# Patient Record
Sex: Male | Born: 1945
Health system: Southern US, Community
[De-identification: ages and names within clinical notes are randomized; demographics above are authoritative.]

## PROBLEM LIST (undated history)

## (undated) ENCOUNTER — Ambulatory Visit (HOSPITAL_COMMUNITY): Admission: EM | Payer: Medicare Other | Source: Home / Self Care

## (undated) DIAGNOSIS — F410 Panic disorder [episodic paroxysmal anxiety] without agoraphobia: Secondary | ICD-10-CM

## (undated) DIAGNOSIS — I10 Essential (primary) hypertension: Secondary | ICD-10-CM

## (undated) DIAGNOSIS — I480 Paroxysmal atrial fibrillation: Secondary | ICD-10-CM

## (undated) DIAGNOSIS — M47812 Spondylosis without myelopathy or radiculopathy, cervical region: Secondary | ICD-10-CM

## (undated) DIAGNOSIS — C9110 Chronic lymphocytic leukemia of B-cell type not having achieved remission: Secondary | ICD-10-CM

## (undated) DIAGNOSIS — E785 Hyperlipidemia, unspecified: Secondary | ICD-10-CM

## (undated) DIAGNOSIS — I340 Nonrheumatic mitral (valve) insufficiency: Secondary | ICD-10-CM

## (undated) DIAGNOSIS — R51 Headache: Secondary | ICD-10-CM

## (undated) DIAGNOSIS — T45515A Adverse effect of anticoagulants, initial encounter: Secondary | ICD-10-CM

## (undated) DIAGNOSIS — I351 Nonrheumatic aortic (valve) insufficiency: Secondary | ICD-10-CM

## (undated) DIAGNOSIS — R911 Solitary pulmonary nodule: Secondary | ICD-10-CM

## (undated) DIAGNOSIS — Z8546 Personal history of malignant neoplasm of prostate: Secondary | ICD-10-CM

## (undated) DIAGNOSIS — F419 Anxiety disorder, unspecified: Secondary | ICD-10-CM

## (undated) DIAGNOSIS — Z86711 Personal history of pulmonary embolism: Secondary | ICD-10-CM

## (undated) DIAGNOSIS — G43909 Migraine, unspecified, not intractable, without status migrainosus: Secondary | ICD-10-CM

## (undated) HISTORY — PX: OTHER SURGICAL HISTORY: SHX169

## (undated) HISTORY — DX: Paroxysmal atrial fibrillation: I48.0

## (undated) HISTORY — DX: Personal history of pulmonary embolism: Z86.711

## (undated) HISTORY — DX: Essential (primary) hypertension: I10

## (undated) HISTORY — DX: Nonrheumatic mitral (valve) insufficiency: I34.0

## (undated) HISTORY — PX: SHOULDER SURGERY: SHX246

## (undated) HISTORY — DX: Chronic lymphocytic leukemia of B-cell type not having achieved remission: C91.10

## (undated) HISTORY — DX: Nonrheumatic aortic (valve) insufficiency: I35.1

## (undated) HISTORY — DX: Spondylosis without myelopathy or radiculopathy, cervical region: M47.812

## (undated) HISTORY — PX: CATARACT EXTRACTION: SUR2

## (undated) HISTORY — DX: Adverse effect of anticoagulants, initial encounter: T45.515A

## (undated) HISTORY — DX: Solitary pulmonary nodule: R91.1

## (undated) HISTORY — DX: Hyperlipidemia, unspecified: E78.5

## (undated) HISTORY — DX: Headache: R51

## (undated) HISTORY — DX: Anxiety disorder, unspecified: F41.9

## (undated) HISTORY — DX: Migraine, unspecified, not intractable, without status migrainosus: G43.909

## (undated) HISTORY — DX: Personal history of malignant neoplasm of prostate: Z85.46

## (undated) HISTORY — PX: PENILE PROSTHESIS IMPLANT: SHX240

## (undated) HISTORY — DX: Panic disorder (episodic paroxysmal anxiety): F41.0

## (undated) HISTORY — PX: TRANSTHORACIC ECHOCARDIOGRAM: SHX275

---

## 1998-09-18 ENCOUNTER — Emergency Department (HOSPITAL_COMMUNITY): Admission: EM | Admit: 1998-09-18 | Discharge: 1998-09-18 | Payer: Self-pay | Admitting: Emergency Medicine

## 1999-11-01 ENCOUNTER — Emergency Department (HOSPITAL_COMMUNITY): Admission: EM | Admit: 1999-11-01 | Discharge: 1999-11-01 | Payer: Self-pay | Admitting: Emergency Medicine

## 1999-11-03 ENCOUNTER — Emergency Department (HOSPITAL_COMMUNITY): Admission: EM | Admit: 1999-11-03 | Discharge: 1999-11-03 | Payer: Self-pay | Admitting: Emergency Medicine

## 1999-11-05 ENCOUNTER — Emergency Department (HOSPITAL_COMMUNITY): Admission: EM | Admit: 1999-11-05 | Discharge: 1999-11-05 | Payer: Self-pay | Admitting: Emergency Medicine

## 1999-12-28 ENCOUNTER — Encounter: Payer: Self-pay | Admitting: Orthopedic Surgery

## 1999-12-28 ENCOUNTER — Encounter: Admission: RE | Admit: 1999-12-28 | Discharge: 1999-12-28 | Payer: Self-pay | Admitting: Orthopedic Surgery

## 2001-11-11 DIAGNOSIS — Z8546 Personal history of malignant neoplasm of prostate: Secondary | ICD-10-CM

## 2001-11-11 HISTORY — DX: Personal history of malignant neoplasm of prostate: Z85.46

## 2002-01-15 ENCOUNTER — Encounter (INDEPENDENT_AMBULATORY_CARE_PROVIDER_SITE_OTHER): Payer: Self-pay | Admitting: *Deleted

## 2002-01-15 ENCOUNTER — Ambulatory Visit (HOSPITAL_BASED_OUTPATIENT_CLINIC_OR_DEPARTMENT_OTHER): Admission: RE | Admit: 2002-01-15 | Discharge: 2002-01-16 | Payer: Self-pay | Admitting: Orthopedic Surgery

## 2002-02-19 ENCOUNTER — Ambulatory Visit: Admission: RE | Admit: 2002-02-19 | Discharge: 2002-05-20 | Payer: Self-pay | Admitting: Radiation Oncology

## 2002-04-11 HISTORY — PX: PROSTATECTOMY: SHX69

## 2002-04-23 ENCOUNTER — Encounter: Payer: Self-pay | Admitting: Urology

## 2002-04-30 ENCOUNTER — Encounter (INDEPENDENT_AMBULATORY_CARE_PROVIDER_SITE_OTHER): Payer: Self-pay | Admitting: *Deleted

## 2002-04-30 ENCOUNTER — Inpatient Hospital Stay (HOSPITAL_COMMUNITY): Admission: RE | Admit: 2002-04-30 | Discharge: 2002-05-04 | Payer: Self-pay | Admitting: Urology

## 2002-05-02 ENCOUNTER — Encounter: Payer: Self-pay | Admitting: Urology

## 2003-02-04 ENCOUNTER — Encounter: Payer: Self-pay | Admitting: Gastroenterology

## 2003-02-04 ENCOUNTER — Encounter: Payer: Self-pay | Admitting: Internal Medicine

## 2003-04-14 ENCOUNTER — Ambulatory Visit (HOSPITAL_COMMUNITY): Admission: RE | Admit: 2003-04-14 | Discharge: 2003-04-14 | Payer: Self-pay | Admitting: Cardiovascular Disease

## 2003-11-12 ENCOUNTER — Emergency Department (HOSPITAL_COMMUNITY): Admission: EM | Admit: 2003-11-12 | Discharge: 2003-11-13 | Payer: Self-pay | Admitting: Emergency Medicine

## 2003-11-13 ENCOUNTER — Emergency Department (HOSPITAL_COMMUNITY): Admission: EM | Admit: 2003-11-13 | Discharge: 2003-11-13 | Payer: Self-pay

## 2004-09-12 ENCOUNTER — Ambulatory Visit: Payer: Self-pay | Admitting: Psychology

## 2004-09-14 ENCOUNTER — Ambulatory Visit: Payer: Self-pay | Admitting: Cardiovascular Disease

## 2004-09-19 ENCOUNTER — Observation Stay (HOSPITAL_COMMUNITY): Admission: RE | Admit: 2004-09-19 | Discharge: 2004-09-20 | Payer: Self-pay | Admitting: Urology

## 2004-10-11 ENCOUNTER — Ambulatory Visit: Payer: Self-pay | Admitting: Psychology

## 2004-10-15 ENCOUNTER — Ambulatory Visit: Payer: Self-pay | Admitting: Psychology

## 2004-11-08 ENCOUNTER — Ambulatory Visit: Payer: Self-pay | Admitting: Psychology

## 2004-11-22 ENCOUNTER — Ambulatory Visit: Payer: Self-pay | Admitting: Psychology

## 2004-12-06 ENCOUNTER — Ambulatory Visit: Payer: Self-pay | Admitting: Psychology

## 2004-12-19 ENCOUNTER — Ambulatory Visit: Payer: Self-pay | Admitting: Psychology

## 2005-01-01 ENCOUNTER — Ambulatory Visit: Payer: Self-pay | Admitting: Psychology

## 2005-01-17 ENCOUNTER — Ambulatory Visit: Payer: Self-pay | Admitting: Psychology

## 2005-01-22 ENCOUNTER — Ambulatory Visit: Payer: Self-pay | Admitting: Internal Medicine

## 2005-01-29 ENCOUNTER — Ambulatory Visit: Payer: Self-pay | Admitting: Internal Medicine

## 2005-01-29 ENCOUNTER — Ambulatory Visit: Payer: Self-pay | Admitting: Psychology

## 2005-01-30 ENCOUNTER — Ambulatory Visit: Payer: Self-pay | Admitting: Cardiology

## 2005-01-30 ENCOUNTER — Inpatient Hospital Stay (HOSPITAL_COMMUNITY): Admission: AD | Admit: 2005-01-30 | Discharge: 2005-02-01 | Payer: Self-pay | Admitting: Cardiovascular Disease

## 2005-01-31 ENCOUNTER — Encounter: Payer: Self-pay | Admitting: Cardiology

## 2005-02-13 ENCOUNTER — Ambulatory Visit: Payer: Self-pay | Admitting: Psychology

## 2005-02-18 ENCOUNTER — Ambulatory Visit: Payer: Self-pay | Admitting: Cardiovascular Disease

## 2005-02-25 ENCOUNTER — Ambulatory Visit: Payer: Self-pay | Admitting: Psychology

## 2005-02-25 ENCOUNTER — Ambulatory Visit: Payer: Self-pay | Admitting: Cardiovascular Disease

## 2005-02-28 ENCOUNTER — Ambulatory Visit: Payer: Self-pay | Admitting: Cardiology

## 2005-03-07 ENCOUNTER — Ambulatory Visit: Payer: Self-pay | Admitting: Cardiology

## 2005-03-18 ENCOUNTER — Ambulatory Visit: Payer: Self-pay | Admitting: Internal Medicine

## 2005-03-18 ENCOUNTER — Encounter: Admission: RE | Admit: 2005-03-18 | Discharge: 2005-03-18 | Payer: Self-pay | Admitting: Internal Medicine

## 2005-03-20 ENCOUNTER — Ambulatory Visit: Payer: Self-pay | Admitting: Cardiovascular Disease

## 2005-03-20 ENCOUNTER — Ambulatory Visit: Payer: Self-pay | Admitting: Cardiology

## 2005-03-27 ENCOUNTER — Ambulatory Visit: Payer: Self-pay | Admitting: Cardiology

## 2005-03-28 ENCOUNTER — Ambulatory Visit: Payer: Self-pay | Admitting: Psychology

## 2005-04-04 ENCOUNTER — Ambulatory Visit: Payer: Self-pay | Admitting: Cardiology

## 2005-04-05 ENCOUNTER — Ambulatory Visit: Payer: Self-pay | Admitting: Pulmonary Disease

## 2005-04-10 ENCOUNTER — Ambulatory Visit: Payer: Self-pay | Admitting: Psychology

## 2005-04-12 ENCOUNTER — Ambulatory Visit: Payer: Self-pay | Admitting: *Deleted

## 2005-04-19 ENCOUNTER — Ambulatory Visit: Payer: Self-pay | Admitting: Cardiology

## 2005-04-22 ENCOUNTER — Ambulatory Visit: Payer: Self-pay | Admitting: Psychology

## 2005-04-25 ENCOUNTER — Ambulatory Visit: Payer: Self-pay | Admitting: Internal Medicine

## 2005-05-02 ENCOUNTER — Ambulatory Visit: Payer: Self-pay | Admitting: Cardiovascular Disease

## 2005-05-02 ENCOUNTER — Ambulatory Visit: Payer: Self-pay

## 2005-05-08 ENCOUNTER — Ambulatory Visit: Payer: Self-pay | Admitting: Psychology

## 2005-05-22 ENCOUNTER — Ambulatory Visit: Payer: Self-pay | Admitting: Psychology

## 2005-06-05 ENCOUNTER — Ambulatory Visit: Payer: Self-pay | Admitting: Psychology

## 2005-06-19 ENCOUNTER — Ambulatory Visit: Payer: Self-pay | Admitting: Psychology

## 2005-07-03 ENCOUNTER — Ambulatory Visit: Payer: Self-pay | Admitting: Psychology

## 2005-07-03 ENCOUNTER — Ambulatory Visit: Payer: Self-pay | Admitting: Cardiology

## 2005-07-05 ENCOUNTER — Ambulatory Visit: Payer: Self-pay | Admitting: Cardiovascular Disease

## 2005-07-17 ENCOUNTER — Ambulatory Visit: Payer: Self-pay | Admitting: Psychology

## 2005-07-31 ENCOUNTER — Ambulatory Visit: Payer: Self-pay | Admitting: Psychology

## 2005-08-14 ENCOUNTER — Ambulatory Visit: Payer: Self-pay | Admitting: Psychology

## 2005-08-28 ENCOUNTER — Ambulatory Visit: Payer: Self-pay | Admitting: Psychology

## 2005-09-27 ENCOUNTER — Ambulatory Visit: Payer: Self-pay | Admitting: Cardiovascular Disease

## 2005-10-08 ENCOUNTER — Ambulatory Visit: Payer: Self-pay | Admitting: Internal Medicine

## 2005-10-09 ENCOUNTER — Ambulatory Visit: Payer: Self-pay | Admitting: Psychology

## 2005-11-20 ENCOUNTER — Ambulatory Visit: Payer: Self-pay | Admitting: Psychology

## 2005-11-27 ENCOUNTER — Emergency Department (HOSPITAL_COMMUNITY): Admission: EM | Admit: 2005-11-27 | Discharge: 2005-11-27 | Payer: Self-pay | Admitting: Emergency Medicine

## 2005-11-29 ENCOUNTER — Ambulatory Visit: Payer: Self-pay | Admitting: Internal Medicine

## 2005-12-04 ENCOUNTER — Ambulatory Visit: Payer: Self-pay | Admitting: Psychology

## 2005-12-18 ENCOUNTER — Ambulatory Visit: Payer: Self-pay | Admitting: Psychology

## 2005-12-19 ENCOUNTER — Ambulatory Visit: Payer: Self-pay | Admitting: Cardiovascular Disease

## 2006-01-16 ENCOUNTER — Ambulatory Visit: Payer: Self-pay | Admitting: Psychology

## 2006-01-23 ENCOUNTER — Ambulatory Visit: Payer: Self-pay | Admitting: Internal Medicine

## 2006-01-30 ENCOUNTER — Ambulatory Visit: Payer: Self-pay | Admitting: Internal Medicine

## 2006-02-12 ENCOUNTER — Ambulatory Visit: Payer: Self-pay | Admitting: Psychology

## 2006-02-25 ENCOUNTER — Ambulatory Visit: Payer: Self-pay | Admitting: Psychology

## 2006-03-12 ENCOUNTER — Ambulatory Visit: Payer: Self-pay | Admitting: Psychology

## 2006-03-18 ENCOUNTER — Ambulatory Visit: Payer: Self-pay | Admitting: Cardiovascular Disease

## 2006-04-23 ENCOUNTER — Ambulatory Visit: Payer: Self-pay | Admitting: Psychology

## 2006-04-29 ENCOUNTER — Ambulatory Visit: Payer: Self-pay | Admitting: Internal Medicine

## 2006-05-07 ENCOUNTER — Ambulatory Visit: Payer: Self-pay | Admitting: Psychology

## 2006-05-12 ENCOUNTER — Ambulatory Visit: Payer: Self-pay | Admitting: Cardiovascular Disease

## 2006-05-12 ENCOUNTER — Encounter: Payer: Self-pay | Admitting: Cardiovascular Disease

## 2006-05-12 ENCOUNTER — Ambulatory Visit: Payer: Self-pay

## 2006-05-21 ENCOUNTER — Ambulatory Visit: Payer: Self-pay | Admitting: Psychology

## 2006-06-04 ENCOUNTER — Ambulatory Visit: Payer: Self-pay | Admitting: Psychology

## 2006-06-14 ENCOUNTER — Encounter: Admission: RE | Admit: 2006-06-14 | Discharge: 2006-06-14 | Payer: Self-pay | Admitting: Family Medicine

## 2006-06-18 ENCOUNTER — Ambulatory Visit: Payer: Self-pay | Admitting: Psychology

## 2006-06-19 ENCOUNTER — Emergency Department (HOSPITAL_COMMUNITY): Admission: EM | Admit: 2006-06-19 | Discharge: 2006-06-20 | Payer: Self-pay | Admitting: Emergency Medicine

## 2006-06-23 ENCOUNTER — Ambulatory Visit: Payer: Self-pay | Admitting: Cardiovascular Disease

## 2006-07-02 ENCOUNTER — Ambulatory Visit: Payer: Self-pay | Admitting: Psychology

## 2006-07-16 ENCOUNTER — Ambulatory Visit: Payer: Self-pay | Admitting: Psychology

## 2006-07-30 ENCOUNTER — Ambulatory Visit: Payer: Self-pay | Admitting: Psychology

## 2006-08-13 ENCOUNTER — Ambulatory Visit: Payer: Self-pay | Admitting: Psychology

## 2006-08-27 ENCOUNTER — Ambulatory Visit: Payer: Self-pay | Admitting: Psychology

## 2006-09-24 ENCOUNTER — Ambulatory Visit: Payer: Self-pay | Admitting: Psychology

## 2006-09-29 ENCOUNTER — Ambulatory Visit: Payer: Self-pay | Admitting: Internal Medicine

## 2006-10-23 ENCOUNTER — Ambulatory Visit: Payer: Self-pay | Admitting: Psychology

## 2006-11-19 ENCOUNTER — Ambulatory Visit: Payer: Self-pay | Admitting: Psychology

## 2006-12-03 ENCOUNTER — Ambulatory Visit: Payer: Self-pay | Admitting: Psychology

## 2006-12-25 ENCOUNTER — Ambulatory Visit: Payer: Self-pay | Admitting: Cardiovascular Disease

## 2006-12-26 ENCOUNTER — Ambulatory Visit: Payer: Self-pay | Admitting: Psychology

## 2007-01-14 ENCOUNTER — Ambulatory Visit: Payer: Self-pay | Admitting: Psychology

## 2007-01-28 ENCOUNTER — Ambulatory Visit: Payer: Self-pay | Admitting: Psychology

## 2007-02-16 ENCOUNTER — Ambulatory Visit: Payer: Self-pay | Admitting: Psychology

## 2007-02-25 ENCOUNTER — Ambulatory Visit: Payer: Self-pay | Admitting: Psychology

## 2007-03-04 ENCOUNTER — Emergency Department (HOSPITAL_COMMUNITY): Admission: EM | Admit: 2007-03-04 | Discharge: 2007-03-05 | Payer: Self-pay | Admitting: Emergency Medicine

## 2007-03-05 ENCOUNTER — Ambulatory Visit: Payer: Self-pay | Admitting: Internal Medicine

## 2007-03-11 ENCOUNTER — Ambulatory Visit: Payer: Self-pay | Admitting: Psychology

## 2007-03-25 ENCOUNTER — Ambulatory Visit: Payer: Self-pay | Admitting: Psychology

## 2007-04-08 ENCOUNTER — Ambulatory Visit: Payer: Self-pay | Admitting: Internal Medicine

## 2007-04-08 LAB — CONVERTED CEMR LAB
ALT: 32 units/L (ref 0–40)
AST: 20 units/L (ref 0–37)
Albumin: 3.8 g/dL (ref 3.5–5.2)
Alkaline Phosphatase: 46 units/L (ref 39–117)
BUN: 25 mg/dL — ABNORMAL HIGH (ref 6–23)
Basophils Absolute: 0 10*3/uL (ref 0.0–0.1)
Basophils Relative: 0.3 % (ref 0.0–1.0)
Bilirubin, Direct: 0.1 mg/dL (ref 0.0–0.3)
CO2: 27 meq/L (ref 19–32)
Calcium: 9.4 mg/dL (ref 8.4–10.5)
Chloride: 109 meq/L (ref 96–112)
Cholesterol: 268 mg/dL (ref 0–200)
Creatinine, Ser: 1.5 mg/dL (ref 0.4–1.5)
Direct LDL: 155.1 mg/dL
Eosinophils Absolute: 0.2 10*3/uL (ref 0.0–0.6)
Eosinophils Relative: 2.8 % (ref 0.0–5.0)
GFR calc Af Amer: 61 mL/min
GFR calc non Af Amer: 51 mL/min
Glucose, Bld: 87 mg/dL (ref 70–99)
HCT: 41.1 % (ref 39.0–52.0)
HDL: 38.2 mg/dL — ABNORMAL LOW (ref >39.0)
Hemoglobin: 14.4 g/dL (ref 13.0–17.0)
Lymphocytes Relative: 49.9 % — ABNORMAL HIGH (ref 12.0–46.0)
MCHC: 35 g/dL (ref 30.0–36.0)
MCV: 90.4 fL (ref 78.0–100.0)
Monocytes Absolute: 0.9 10*3/uL — ABNORMAL HIGH (ref 0.2–0.7)
Monocytes Relative: 12.7 % — ABNORMAL HIGH (ref 3.0–11.0)
Neutro Abs: 2.4 10*3/uL (ref 1.4–7.7)
Neutrophils Relative %: 34.3 % — ABNORMAL LOW (ref 43.0–77.0)
PSA: 0.01 ng/mL — ABNORMAL LOW (ref 0.10–4.00)
Platelets: 249 10*3/uL (ref 150–400)
Potassium: 4.3 meq/L (ref 3.5–5.1)
RBC: 4.54 M/uL (ref 4.22–5.81)
RDW: 12.2 % (ref 11.5–14.6)
Sodium: 141 meq/L (ref 135–145)
TSH: 1.56 microintl units/mL (ref 0.35–5.50)
Total Bilirubin: 0.9 mg/dL (ref 0.3–1.2)
Total CHOL/HDL Ratio: 7
Total Protein: 6.7 g/dL (ref 6.0–8.3)
Triglycerides: 161 mg/dL — ABNORMAL HIGH (ref 0–149)
VLDL: 32 mg/dL (ref 0–40)
WBC: 7 10*3/uL (ref 4.5–10.5)

## 2007-04-16 ENCOUNTER — Ambulatory Visit: Payer: Self-pay | Admitting: Internal Medicine

## 2007-04-20 DIAGNOSIS — I4891 Unspecified atrial fibrillation: Secondary | ICD-10-CM | POA: Insufficient documentation

## 2007-04-20 DIAGNOSIS — I480 Paroxysmal atrial fibrillation: Secondary | ICD-10-CM | POA: Insufficient documentation

## 2007-04-20 DIAGNOSIS — E785 Hyperlipidemia, unspecified: Secondary | ICD-10-CM | POA: Insufficient documentation

## 2007-04-20 DIAGNOSIS — I359 Nonrheumatic aortic valve disorder, unspecified: Secondary | ICD-10-CM | POA: Insufficient documentation

## 2007-04-20 DIAGNOSIS — Z8546 Personal history of malignant neoplasm of prostate: Secondary | ICD-10-CM | POA: Insufficient documentation

## 2007-04-20 DIAGNOSIS — I10 Essential (primary) hypertension: Secondary | ICD-10-CM | POA: Insufficient documentation

## 2007-04-22 ENCOUNTER — Ambulatory Visit: Payer: Self-pay | Admitting: Psychology

## 2007-04-29 ENCOUNTER — Encounter: Payer: Self-pay | Admitting: Internal Medicine

## 2007-05-06 ENCOUNTER — Ambulatory Visit: Payer: Self-pay | Admitting: Psychology

## 2007-06-03 ENCOUNTER — Ambulatory Visit: Payer: Self-pay | Admitting: Psychology

## 2007-06-10 ENCOUNTER — Ambulatory Visit: Payer: Self-pay

## 2007-06-17 ENCOUNTER — Ambulatory Visit: Payer: Self-pay | Admitting: Internal Medicine

## 2007-06-17 ENCOUNTER — Ambulatory Visit: Payer: Self-pay | Admitting: Psychology

## 2007-06-17 LAB — CONVERTED CEMR LAB
ALT: 28 units/L (ref 0–53)
AST: 21 units/L (ref 0–37)
Albumin: 4 g/dL (ref 3.5–5.2)
Alkaline Phosphatase: 57 units/L (ref 39–117)
Bilirubin, Direct: 0.1 mg/dL (ref 0.0–0.3)
Cholesterol: 245 mg/dL (ref 0–200)
Direct LDL: 162.4 mg/dL
HDL: 41.3 mg/dL (ref >39.0)
Total Bilirubin: 1 mg/dL (ref 0.3–1.2)
Total CHOL/HDL Ratio: 5.9
Total Protein: 6.6 g/dL (ref 6.0–8.3)
Triglycerides: 104 mg/dL (ref 0–149)
VLDL: 21 mg/dL (ref 0–40)

## 2007-06-24 ENCOUNTER — Ambulatory Visit: Payer: Self-pay | Admitting: Internal Medicine

## 2007-07-01 ENCOUNTER — Ambulatory Visit: Payer: Self-pay | Admitting: Psychology

## 2007-07-29 ENCOUNTER — Ambulatory Visit: Payer: Self-pay | Admitting: Psychology

## 2007-08-12 ENCOUNTER — Ambulatory Visit: Payer: Self-pay | Admitting: Psychology

## 2007-08-26 ENCOUNTER — Ambulatory Visit: Payer: Self-pay | Admitting: Psychology

## 2007-08-28 ENCOUNTER — Telehealth: Payer: Self-pay | Admitting: Internal Medicine

## 2007-09-03 ENCOUNTER — Telehealth (INDEPENDENT_AMBULATORY_CARE_PROVIDER_SITE_OTHER): Payer: Self-pay | Admitting: *Deleted

## 2007-09-04 ENCOUNTER — Ambulatory Visit: Payer: Self-pay | Admitting: Internal Medicine

## 2007-09-06 DIAGNOSIS — F411 Generalized anxiety disorder: Secondary | ICD-10-CM | POA: Insufficient documentation

## 2007-09-09 ENCOUNTER — Ambulatory Visit: Payer: Self-pay | Admitting: Psychology

## 2007-09-23 ENCOUNTER — Ambulatory Visit: Payer: Self-pay | Admitting: Psychology

## 2007-10-06 ENCOUNTER — Ambulatory Visit: Payer: Self-pay | Admitting: Psychology

## 2007-10-13 ENCOUNTER — Ambulatory Visit: Payer: Self-pay | Admitting: Cardiovascular Disease

## 2007-10-20 ENCOUNTER — Ambulatory Visit: Payer: Self-pay | Admitting: Internal Medicine

## 2007-10-20 LAB — CONVERTED CEMR LAB
ALT: 36 units/L (ref 0–53)
AST: 22 units/L (ref 0–37)
Albumin: 3.8 g/dL (ref 3.5–5.2)
Alkaline Phosphatase: 55 units/L (ref 39–117)
Bilirubin, Direct: 0.1 mg/dL (ref 0.0–0.3)
Cholesterol: 190 mg/dL (ref 0–200)
HDL: 33.9 mg/dL — ABNORMAL LOW (ref >39.0)
LDL Cholesterol: 130 mg/dL — ABNORMAL HIGH (ref 0–99)
Total Bilirubin: 0.8 mg/dL (ref 0.3–1.2)
Total CHOL/HDL Ratio: 5.6
Total Protein: 6.3 g/dL (ref 6.0–8.3)
Triglycerides: 129 mg/dL (ref 0–149)
VLDL: 26 mg/dL (ref 0–40)

## 2007-10-21 ENCOUNTER — Ambulatory Visit: Payer: Self-pay | Admitting: Psychology

## 2007-10-23 ENCOUNTER — Emergency Department (HOSPITAL_COMMUNITY): Admission: EM | Admit: 2007-10-23 | Discharge: 2007-10-23 | Payer: Self-pay | Admitting: Emergency Medicine

## 2007-10-23 ENCOUNTER — Ambulatory Visit: Payer: Self-pay | Admitting: Internal Medicine

## 2007-10-27 ENCOUNTER — Ambulatory Visit: Payer: Self-pay | Admitting: Internal Medicine

## 2007-10-28 LAB — CONVERTED CEMR LAB
Basophils Absolute: 0 10*3/uL (ref 0.0–0.1)
Basophils Relative: 0.3 % (ref 0.0–1.0)
Eosinophils Absolute: 0.1 10*3/uL (ref 0.0–0.6)
Eosinophils Relative: 2.5 % (ref 0.0–5.0)
HCT: 43.3 % (ref 39.0–52.0)
Hemoglobin: 15.1 g/dL (ref 13.0–17.0)
Lymphocytes Relative: 61.7 % — ABNORMAL HIGH (ref 12.0–46.0)
MCHC: 34.8 g/dL (ref 30.0–36.0)
MCV: 90.2 fL (ref 78.0–100.0)
Monocytes Absolute: 0.6 10*3/uL (ref 0.2–0.7)
Monocytes Relative: 11.5 % — ABNORMAL HIGH (ref 3.0–11.0)
Neutro Abs: 1.3 10*3/uL — ABNORMAL LOW (ref 1.4–7.7)
Neutrophils Relative %: 24 % — ABNORMAL LOW (ref 43.0–77.0)
Platelets: 239 10*3/uL (ref 150–400)
RBC: 4.8 M/uL (ref 4.22–5.81)
RDW: 12.4 % (ref 11.5–14.6)
WBC: 5.5 10*3/uL (ref 4.5–10.5)

## 2007-11-01 ENCOUNTER — Telehealth: Payer: Self-pay | Admitting: Internal Medicine

## 2007-11-02 ENCOUNTER — Encounter: Payer: Self-pay | Admitting: Cardiology

## 2007-11-02 ENCOUNTER — Observation Stay (HOSPITAL_COMMUNITY): Admission: EM | Admit: 2007-11-02 | Discharge: 2007-11-02 | Payer: Self-pay | Admitting: Emergency Medicine

## 2007-11-02 ENCOUNTER — Ambulatory Visit: Payer: Self-pay | Admitting: Cardiology

## 2007-11-02 ENCOUNTER — Ambulatory Visit: Payer: Self-pay

## 2007-11-03 ENCOUNTER — Telehealth: Payer: Self-pay | Admitting: Internal Medicine

## 2007-11-03 ENCOUNTER — Ambulatory Visit: Payer: Self-pay | Admitting: Internal Medicine

## 2007-11-04 ENCOUNTER — Ambulatory Visit: Payer: Self-pay | Admitting: Psychology

## 2007-11-16 ENCOUNTER — Telehealth: Payer: Self-pay | Admitting: Internal Medicine

## 2007-11-18 ENCOUNTER — Ambulatory Visit: Payer: Self-pay | Admitting: Psychology

## 2007-11-20 ENCOUNTER — Ambulatory Visit: Payer: Self-pay | Admitting: Internal Medicine

## 2007-12-01 ENCOUNTER — Ambulatory Visit: Payer: Self-pay | Admitting: Psychology

## 2007-12-15 ENCOUNTER — Ambulatory Visit: Payer: Self-pay | Admitting: Cardiovascular Disease

## 2007-12-16 ENCOUNTER — Ambulatory Visit: Payer: Self-pay | Admitting: Psychology

## 2007-12-18 ENCOUNTER — Telehealth: Payer: Self-pay | Admitting: Internal Medicine

## 2007-12-24 ENCOUNTER — Ambulatory Visit: Payer: Self-pay | Admitting: Internal Medicine

## 2007-12-24 ENCOUNTER — Ambulatory Visit (HOSPITAL_COMMUNITY): Admission: RE | Admit: 2007-12-24 | Discharge: 2007-12-24 | Payer: Self-pay | Admitting: Cardiovascular Disease

## 2007-12-30 ENCOUNTER — Ambulatory Visit: Payer: Self-pay | Admitting: Psychology

## 2008-01-01 ENCOUNTER — Telehealth: Payer: Self-pay | Admitting: Internal Medicine

## 2008-01-13 ENCOUNTER — Ambulatory Visit: Payer: Self-pay | Admitting: Psychology

## 2008-01-27 ENCOUNTER — Ambulatory Visit: Payer: Self-pay | Admitting: Psychology

## 2008-02-03 ENCOUNTER — Ambulatory Visit: Payer: Self-pay | Admitting: Internal Medicine

## 2008-02-03 DIAGNOSIS — G4700 Insomnia, unspecified: Secondary | ICD-10-CM | POA: Insufficient documentation

## 2008-02-10 ENCOUNTER — Ambulatory Visit: Payer: Self-pay | Admitting: Psychology

## 2008-02-24 ENCOUNTER — Ambulatory Visit: Payer: Self-pay | Admitting: Psychology

## 2008-03-09 ENCOUNTER — Ambulatory Visit: Payer: Self-pay | Admitting: Psychology

## 2008-03-23 ENCOUNTER — Ambulatory Visit: Payer: Self-pay | Admitting: Psychology

## 2008-04-06 ENCOUNTER — Ambulatory Visit: Payer: Self-pay | Admitting: Psychology

## 2008-04-18 ENCOUNTER — Ambulatory Visit: Payer: Self-pay | Admitting: Cardiovascular Disease

## 2008-04-20 ENCOUNTER — Ambulatory Visit: Payer: Self-pay | Admitting: Psychology

## 2008-05-03 ENCOUNTER — Ambulatory Visit: Payer: Self-pay | Admitting: Psychology

## 2008-05-18 ENCOUNTER — Ambulatory Visit: Payer: Self-pay | Admitting: Psychology

## 2008-06-01 ENCOUNTER — Ambulatory Visit: Payer: Self-pay | Admitting: Psychology

## 2008-06-22 ENCOUNTER — Ambulatory Visit: Payer: Self-pay | Admitting: Psychology

## 2008-07-01 ENCOUNTER — Ambulatory Visit: Payer: Self-pay | Admitting: Internal Medicine

## 2008-07-04 LAB — CONVERTED CEMR LAB: PSA: 0.01 ng/mL — ABNORMAL LOW (ref 0.10–4.00)

## 2008-07-12 ENCOUNTER — Encounter: Payer: Self-pay | Admitting: Internal Medicine

## 2008-07-13 ENCOUNTER — Ambulatory Visit: Payer: Self-pay | Admitting: Psychology

## 2008-07-27 ENCOUNTER — Ambulatory Visit: Payer: Self-pay | Admitting: Psychology

## 2008-08-10 ENCOUNTER — Ambulatory Visit: Payer: Self-pay | Admitting: Psychology

## 2008-09-07 ENCOUNTER — Ambulatory Visit: Payer: Self-pay | Admitting: Psychology

## 2008-09-21 ENCOUNTER — Ambulatory Visit: Payer: Self-pay | Admitting: Psychology

## 2008-09-22 ENCOUNTER — Ambulatory Visit: Payer: Self-pay

## 2008-09-22 ENCOUNTER — Encounter: Payer: Self-pay | Admitting: Cardiovascular Disease

## 2008-09-22 ENCOUNTER — Ambulatory Visit: Payer: Self-pay | Admitting: Cardiovascular Disease

## 2008-10-12 ENCOUNTER — Ambulatory Visit: Payer: Self-pay | Admitting: Internal Medicine

## 2008-10-14 ENCOUNTER — Telehealth: Payer: Self-pay | Admitting: Internal Medicine

## 2008-10-19 ENCOUNTER — Ambulatory Visit: Payer: Self-pay | Admitting: Psychology

## 2008-11-24 ENCOUNTER — Ambulatory Visit: Payer: Self-pay | Admitting: Psychology

## 2008-12-08 ENCOUNTER — Ambulatory Visit: Payer: Self-pay | Admitting: Psychology

## 2008-12-19 ENCOUNTER — Ambulatory Visit: Payer: Self-pay | Admitting: Internal Medicine

## 2008-12-20 LAB — CONVERTED CEMR LAB
ALT: 49 units/L (ref 0–53)
AST: 22 units/L (ref 0–37)
Albumin: 4 g/dL (ref 3.5–5.2)
Alkaline Phosphatase: 57 units/L (ref 39–117)
BUN: 24 mg/dL — ABNORMAL HIGH (ref 6–23)
Basophils Absolute: 0 10*3/uL (ref 0.0–0.1)
Basophils Relative: 0 % (ref 0.0–3.0)
Bilirubin, Direct: 0.1 mg/dL (ref 0.0–0.3)
CO2: 30 meq/L (ref 19–32)
Calcium: 9.6 mg/dL (ref 8.4–10.5)
Chloride: 102 meq/L (ref 96–112)
Cholesterol: 239 mg/dL (ref 0–200)
Creatinine, Ser: 1.4 mg/dL (ref 0.4–1.5)
Direct LDL: 169.9 mg/dL
Eosinophils Absolute: 0.1 10*3/uL (ref 0.0–0.7)
Eosinophils Relative: 1.9 % (ref 0.0–5.0)
GFR calc Af Amer: 66 mL/min
GFR calc non Af Amer: 55 mL/min
Glucose, Bld: 90 mg/dL (ref 70–99)
HCT: 42.6 % (ref 39.0–52.0)
HDL: 37.9 mg/dL — ABNORMAL LOW (ref >39.0)
Hemoglobin: 14.4 g/dL (ref 13.0–17.0)
Lymphocytes Relative: 57.1 % — ABNORMAL HIGH (ref 12.0–46.0)
MCHC: 33.8 g/dL (ref 30.0–36.0)
MCV: 94.2 fL (ref 78.0–100.0)
Monocytes Absolute: 0.7 10*3/uL (ref 0.1–1.0)
Monocytes Relative: 9.7 % (ref 3.0–12.0)
Neutro Abs: 2.3 10*3/uL (ref 1.4–7.7)
Neutrophils Relative %: 31.3 % — ABNORMAL LOW (ref 43.0–77.0)
Platelets: 211 10*3/uL (ref 150–400)
Potassium: 4.5 meq/L (ref 3.5–5.1)
RBC: 4.52 M/uL (ref 4.22–5.81)
RDW: 12.2 % (ref 11.5–14.6)
Sodium: 141 meq/L (ref 135–145)
Total Bilirubin: 0.8 mg/dL (ref 0.3–1.2)
Total CHOL/HDL Ratio: 6.3
Total Protein: 7 g/dL (ref 6.0–8.3)
Triglycerides: 139 mg/dL (ref 0–149)
VLDL: 28 mg/dL (ref 0–40)
WBC: 7.4 10*3/uL (ref 4.5–10.5)

## 2008-12-22 ENCOUNTER — Ambulatory Visit: Payer: Self-pay | Admitting: Psychology

## 2008-12-27 ENCOUNTER — Ambulatory Visit: Payer: Self-pay | Admitting: Internal Medicine

## 2009-01-05 ENCOUNTER — Ambulatory Visit: Payer: Self-pay | Admitting: Psychology

## 2009-01-13 ENCOUNTER — Ambulatory Visit: Payer: Self-pay | Admitting: Internal Medicine

## 2009-01-16 LAB — CONVERTED CEMR LAB
Basophils Absolute: 0 10*3/uL (ref 0.0–0.1)
Basophils Relative: 0.1 % (ref 0.0–3.0)
Eosinophils Absolute: 0.1 10*3/uL (ref 0.0–0.7)
Eosinophils Relative: 1.9 % (ref 0.0–5.0)
HCT: 40.9 % (ref 39.0–52.0)
Hemoglobin: 14.2 g/dL (ref 13.0–17.0)
Lymphocytes Relative: 59.1 % — ABNORMAL HIGH (ref 12.0–46.0)
MCHC: 34.6 g/dL (ref 30.0–36.0)
MCV: 92.4 fL (ref 78.0–100.0)
Monocytes Absolute: 0.7 10*3/uL (ref 0.1–1.0)
Monocytes Relative: 10 % (ref 3.0–12.0)
Neutro Abs: 1.9 10*3/uL (ref 1.4–7.7)
Neutrophils Relative %: 28.9 % — ABNORMAL LOW (ref 43.0–77.0)
Platelets: 201 10*3/uL (ref 150–400)
RBC: 4.43 M/uL (ref 4.22–5.81)
RDW: 12 % (ref 11.5–14.6)
WBC: 6.7 10*3/uL (ref 4.5–10.5)

## 2009-01-19 ENCOUNTER — Ambulatory Visit: Payer: Self-pay | Admitting: Psychology

## 2009-02-02 ENCOUNTER — Ambulatory Visit: Payer: Self-pay | Admitting: Psychology

## 2009-03-16 ENCOUNTER — Ambulatory Visit: Payer: Self-pay | Admitting: Psychology

## 2009-04-07 DIAGNOSIS — I080 Rheumatic disorders of both mitral and aortic valves: Secondary | ICD-10-CM | POA: Insufficient documentation

## 2009-04-11 ENCOUNTER — Ambulatory Visit: Payer: Self-pay | Admitting: Cardiovascular Disease

## 2009-04-12 ENCOUNTER — Telehealth: Payer: Self-pay | Admitting: Internal Medicine

## 2009-04-12 ENCOUNTER — Ambulatory Visit: Payer: Self-pay | Admitting: Internal Medicine

## 2009-04-13 ENCOUNTER — Ambulatory Visit: Payer: Self-pay | Admitting: Psychology

## 2009-04-27 ENCOUNTER — Ambulatory Visit: Payer: Self-pay | Admitting: Psychology

## 2009-05-24 ENCOUNTER — Telehealth (INDEPENDENT_AMBULATORY_CARE_PROVIDER_SITE_OTHER): Payer: Self-pay | Admitting: *Deleted

## 2009-05-24 ENCOUNTER — Ambulatory Visit: Payer: Self-pay | Admitting: Family Medicine

## 2009-05-25 ENCOUNTER — Ambulatory Visit: Payer: Self-pay | Admitting: Psychology

## 2009-05-25 ENCOUNTER — Telehealth: Payer: Self-pay | Admitting: Family Medicine

## 2009-05-26 ENCOUNTER — Ambulatory Visit: Payer: Self-pay | Admitting: Family Medicine

## 2009-06-22 ENCOUNTER — Ambulatory Visit: Payer: Self-pay | Admitting: Psychology

## 2009-07-06 ENCOUNTER — Ambulatory Visit: Payer: Self-pay | Admitting: Psychology

## 2009-08-03 ENCOUNTER — Ambulatory Visit: Payer: Self-pay | Admitting: Psychology

## 2009-08-16 ENCOUNTER — Telehealth: Payer: Self-pay | Admitting: Internal Medicine

## 2009-08-31 ENCOUNTER — Ambulatory Visit: Payer: Self-pay | Admitting: Psychology

## 2009-09-11 ENCOUNTER — Ambulatory Visit: Payer: Self-pay | Admitting: Cardiovascular Disease

## 2009-09-14 ENCOUNTER — Telehealth: Payer: Self-pay | Admitting: Internal Medicine

## 2009-09-15 ENCOUNTER — Telehealth: Payer: Self-pay | Admitting: Internal Medicine

## 2009-09-21 ENCOUNTER — Encounter: Payer: Self-pay | Admitting: Cardiovascular Disease

## 2009-09-21 ENCOUNTER — Ambulatory Visit: Payer: Self-pay

## 2009-09-21 ENCOUNTER — Ambulatory Visit (HOSPITAL_COMMUNITY): Admission: RE | Admit: 2009-09-21 | Discharge: 2009-09-21 | Payer: Self-pay | Admitting: Cardiovascular Disease

## 2009-09-21 ENCOUNTER — Ambulatory Visit: Payer: Self-pay | Admitting: Cardiovascular Disease

## 2009-09-28 ENCOUNTER — Ambulatory Visit: Payer: Self-pay | Admitting: Psychology

## 2009-10-12 ENCOUNTER — Ambulatory Visit: Payer: Self-pay | Admitting: Psychology

## 2009-10-18 ENCOUNTER — Ambulatory Visit: Payer: Self-pay | Admitting: Cardiovascular Disease

## 2009-11-11 HISTORY — PX: AORTIC VALVE REPLACEMENT: SHX41

## 2009-11-23 ENCOUNTER — Ambulatory Visit: Payer: Self-pay | Admitting: Psychology

## 2009-12-07 ENCOUNTER — Ambulatory Visit: Payer: Self-pay | Admitting: Psychology

## 2009-12-11 ENCOUNTER — Ambulatory Visit: Payer: Self-pay | Admitting: Internal Medicine

## 2009-12-11 DIAGNOSIS — K219 Gastro-esophageal reflux disease without esophagitis: Secondary | ICD-10-CM | POA: Insufficient documentation

## 2009-12-13 ENCOUNTER — Encounter (INDEPENDENT_AMBULATORY_CARE_PROVIDER_SITE_OTHER): Payer: Self-pay | Admitting: *Deleted

## 2009-12-14 ENCOUNTER — Telehealth: Payer: Self-pay | Admitting: Internal Medicine

## 2009-12-18 ENCOUNTER — Telehealth: Payer: Self-pay | Admitting: Cardiovascular Disease

## 2010-01-09 ENCOUNTER — Ambulatory Visit: Payer: Self-pay | Admitting: Gastroenterology

## 2010-01-18 ENCOUNTER — Telehealth (INDEPENDENT_AMBULATORY_CARE_PROVIDER_SITE_OTHER): Payer: Self-pay | Admitting: *Deleted

## 2010-01-21 ENCOUNTER — Emergency Department (HOSPITAL_COMMUNITY): Admission: EM | Admit: 2010-01-21 | Discharge: 2010-01-21 | Payer: Self-pay | Admitting: Emergency Medicine

## 2010-02-01 ENCOUNTER — Ambulatory Visit: Payer: Self-pay | Admitting: Psychology

## 2010-02-15 ENCOUNTER — Ambulatory Visit: Payer: Self-pay | Admitting: Psychology

## 2010-02-22 ENCOUNTER — Encounter (INDEPENDENT_AMBULATORY_CARE_PROVIDER_SITE_OTHER): Payer: Self-pay | Admitting: *Deleted

## 2010-02-28 ENCOUNTER — Ambulatory Visit: Payer: Self-pay | Admitting: Internal Medicine

## 2010-03-01 LAB — CONVERTED CEMR LAB: Sed Rate: 7 mm/hr (ref 0–22)

## 2010-03-13 ENCOUNTER — Encounter (INDEPENDENT_AMBULATORY_CARE_PROVIDER_SITE_OTHER): Payer: Self-pay

## 2010-03-15 ENCOUNTER — Ambulatory Visit: Payer: Self-pay | Admitting: Gastroenterology

## 2010-03-15 ENCOUNTER — Ambulatory Visit: Payer: Self-pay | Admitting: Psychology

## 2010-03-20 ENCOUNTER — Ambulatory Visit: Payer: Self-pay | Admitting: Internal Medicine

## 2010-03-20 LAB — CONVERTED CEMR LAB
ALT: 22 units/L (ref 0–53)
AST: 19 units/L (ref 0–37)
Albumin: 4 g/dL (ref 3.5–5.2)
Alkaline Phosphatase: 53 units/L (ref 39–117)
BUN: 21 mg/dL (ref 6–23)
Basophils Absolute: 0 10*3/uL (ref 0.0–0.1)
Basophils Relative: 1 % (ref 0–1)
Bilirubin, Direct: 0.1 mg/dL (ref 0.0–0.3)
Blood in Urine, dipstick: NEGATIVE
CO2: 31 meq/L (ref 19–32)
Calcium: 9.4 mg/dL (ref 8.4–10.5)
Chloride: 107 meq/L (ref 96–112)
Cholesterol: 223 mg/dL — ABNORMAL HIGH (ref 0–200)
Creatinine, Ser: 1.2 mg/dL (ref 0.4–1.5)
Direct LDL: 152.3 mg/dL
Eosinophils Absolute: 0.2 10*3/uL (ref 0.0–0.7)
Eosinophils Relative: 2 % (ref 0–5)
GFR calc non Af Amer: 75.46 mL/min (ref >60)
Glucose, Bld: 94 mg/dL (ref 70–99)
Glucose, Urine, Semiquant: NEGATIVE
HCT: 42.7 % (ref 39.0–52.0)
HDL: 43.5 mg/dL (ref >39.00)
Hemoglobin: 13.7 g/dL (ref 13.0–17.0)
Lymphocytes Relative: 60 % — ABNORMAL HIGH (ref 12–46)
Lymphs Abs: 3.8 10*3/uL (ref 0.7–4.0)
MCHC: 32.1 g/dL (ref 30.0–36.0)
MCV: 95.7 fL (ref 78.0–100.0)
Monocytes Absolute: 0.7 10*3/uL (ref 0.1–1.0)
Monocytes Relative: 10 % (ref 3–12)
Neutro Abs: 1.7 10*3/uL (ref 1.7–7.7)
Neutrophils Relative %: 27 % — ABNORMAL LOW (ref 43–77)
Nitrite: NEGATIVE
PSA: 0 ng/mL — ABNORMAL LOW (ref 0.10–4.00)
Platelets: 201 10*3/uL (ref 150–400)
Potassium: 5 meq/L (ref 3.5–5.1)
RBC: 4.46 M/uL (ref 4.22–5.81)
RDW: 14.3 % (ref 11.5–15.5)
Sodium: 145 meq/L (ref 135–145)
Specific Gravity, Urine: >=1.03
TSH: 0.94 microintl units/mL (ref 0.35–5.50)
Total Bilirubin: 0.6 mg/dL (ref 0.3–1.2)
Total CHOL/HDL Ratio: 5
Total Protein: 6.4 g/dL (ref 6.0–8.3)
Triglycerides: 100 mg/dL (ref 0.0–149.0)
Urobilinogen, UA: 0.2
VLDL: 20 mg/dL (ref 0.0–40.0)
WBC Urine, dipstick: NEGATIVE
WBC: 6.4 10*3/uL (ref 4.0–10.5)
pH: 5

## 2010-03-26 ENCOUNTER — Ambulatory Visit: Payer: Self-pay | Admitting: Gastroenterology

## 2010-03-26 LAB — CONVERTED CEMR LAB: UREASE: NEGATIVE

## 2010-03-27 ENCOUNTER — Ambulatory Visit: Payer: Self-pay | Admitting: Internal Medicine

## 2010-03-29 ENCOUNTER — Ambulatory Visit: Payer: Self-pay | Admitting: Psychology

## 2010-04-04 ENCOUNTER — Encounter: Payer: Self-pay | Admitting: Gastroenterology

## 2010-04-11 ENCOUNTER — Ambulatory Visit: Payer: Self-pay | Admitting: Cardiovascular Disease

## 2010-04-11 HISTORY — PX: CARDIAC CATHETERIZATION: SHX172

## 2010-04-12 ENCOUNTER — Telehealth: Payer: Self-pay | Admitting: Internal Medicine

## 2010-04-12 ENCOUNTER — Encounter: Admission: RE | Admit: 2010-04-12 | Discharge: 2010-04-12 | Payer: Self-pay | Admitting: Family Medicine

## 2010-04-13 ENCOUNTER — Telehealth: Payer: Self-pay | Admitting: Cardiovascular Disease

## 2010-04-13 ENCOUNTER — Ambulatory Visit: Payer: Self-pay | Admitting: Psychology

## 2010-04-18 ENCOUNTER — Ambulatory Visit: Payer: Self-pay | Admitting: Cardiovascular Disease

## 2010-04-18 ENCOUNTER — Encounter: Payer: Self-pay | Admitting: Cardiovascular Disease

## 2010-04-18 ENCOUNTER — Ambulatory Visit: Payer: Self-pay

## 2010-04-18 ENCOUNTER — Ambulatory Visit (HOSPITAL_COMMUNITY): Admission: RE | Admit: 2010-04-18 | Discharge: 2010-04-18 | Payer: Self-pay | Admitting: Cardiovascular Disease

## 2010-04-19 ENCOUNTER — Encounter: Payer: Self-pay | Admitting: Cardiovascular Disease

## 2010-04-19 ENCOUNTER — Encounter: Payer: Self-pay | Admitting: Cardiology

## 2010-04-20 ENCOUNTER — Encounter: Payer: Self-pay | Admitting: Cardiovascular Disease

## 2010-04-23 ENCOUNTER — Ambulatory Visit: Payer: Self-pay | Admitting: Cardiovascular Disease

## 2010-04-23 ENCOUNTER — Ambulatory Visit (HOSPITAL_COMMUNITY): Admission: RE | Admit: 2010-04-23 | Discharge: 2010-04-23 | Payer: Self-pay | Admitting: Cardiovascular Disease

## 2010-04-23 ENCOUNTER — Telehealth (INDEPENDENT_AMBULATORY_CARE_PROVIDER_SITE_OTHER): Payer: Self-pay | Admitting: *Deleted

## 2010-04-26 ENCOUNTER — Ambulatory Visit: Payer: Self-pay | Admitting: Psychology

## 2010-04-30 ENCOUNTER — Ambulatory Visit: Payer: Self-pay | Admitting: Thoracic Surgery (Cardiothoracic Vascular Surgery)

## 2010-04-30 ENCOUNTER — Encounter: Payer: Self-pay | Admitting: Internal Medicine

## 2010-05-01 ENCOUNTER — Telehealth: Payer: Self-pay | Admitting: Internal Medicine

## 2010-05-10 ENCOUNTER — Encounter: Payer: Self-pay | Admitting: Cardiovascular Disease

## 2010-05-10 ENCOUNTER — Encounter: Payer: Self-pay | Admitting: Internal Medicine

## 2010-05-24 ENCOUNTER — Ambulatory Visit: Payer: Self-pay | Admitting: Psychology

## 2010-06-06 ENCOUNTER — Encounter: Payer: Self-pay | Admitting: Internal Medicine

## 2010-06-19 ENCOUNTER — Telehealth: Payer: Self-pay | Admitting: Internal Medicine

## 2010-06-25 ENCOUNTER — Ambulatory Visit: Payer: Self-pay | Admitting: Internal Medicine

## 2010-06-26 ENCOUNTER — Telehealth: Payer: Self-pay | Admitting: Cardiovascular Disease

## 2010-06-27 ENCOUNTER — Telehealth (INDEPENDENT_AMBULATORY_CARE_PROVIDER_SITE_OTHER): Payer: Self-pay | Admitting: *Deleted

## 2010-06-27 ENCOUNTER — Telehealth: Payer: Self-pay | Admitting: Cardiovascular Disease

## 2010-06-28 ENCOUNTER — Telehealth: Payer: Self-pay | Admitting: Cardiovascular Disease

## 2010-06-28 ENCOUNTER — Telehealth: Payer: Self-pay | Admitting: Internal Medicine

## 2010-06-29 ENCOUNTER — Encounter: Payer: Self-pay | Admitting: Cardiovascular Disease

## 2010-07-02 ENCOUNTER — Telehealth: Payer: Self-pay | Admitting: Internal Medicine

## 2010-07-06 ENCOUNTER — Ambulatory Visit: Payer: Self-pay | Admitting: Psychology

## 2010-07-10 ENCOUNTER — Ambulatory Visit: Payer: Self-pay | Admitting: Cardiovascular Disease

## 2010-07-16 ENCOUNTER — Ambulatory Visit: Payer: Self-pay | Admitting: Internal Medicine

## 2010-07-16 ENCOUNTER — Ambulatory Visit: Payer: Self-pay | Admitting: Cardiovascular Disease

## 2010-07-16 ENCOUNTER — Inpatient Hospital Stay (HOSPITAL_COMMUNITY): Admission: EM | Admit: 2010-07-16 | Discharge: 2010-07-23 | Payer: Self-pay | Admitting: Emergency Medicine

## 2010-07-17 ENCOUNTER — Ambulatory Visit: Payer: Self-pay | Admitting: Vascular Surgery

## 2010-07-17 ENCOUNTER — Encounter: Payer: Self-pay | Admitting: Cardiovascular Disease

## 2010-07-17 ENCOUNTER — Encounter: Payer: Self-pay | Admitting: Cardiology

## 2010-07-23 ENCOUNTER — Telehealth: Payer: Self-pay | Admitting: Cardiovascular Disease

## 2010-07-24 ENCOUNTER — Encounter: Payer: Self-pay | Admitting: Internal Medicine

## 2010-07-25 ENCOUNTER — Emergency Department (HOSPITAL_COMMUNITY): Admission: EM | Admit: 2010-07-25 | Discharge: 2010-07-25 | Payer: Self-pay | Admitting: Emergency Medicine

## 2010-07-25 ENCOUNTER — Encounter: Payer: Self-pay | Admitting: Cardiovascular Disease

## 2010-07-25 LAB — CONVERTED CEMR LAB: INR: 2.05

## 2010-07-27 ENCOUNTER — Ambulatory Visit: Payer: Self-pay | Admitting: Internal Medicine

## 2010-07-27 DIAGNOSIS — I2699 Other pulmonary embolism without acute cor pulmonale: Secondary | ICD-10-CM | POA: Insufficient documentation

## 2010-07-27 DIAGNOSIS — I82409 Acute embolism and thrombosis of unspecified deep veins of unspecified lower extremity: Secondary | ICD-10-CM | POA: Insufficient documentation

## 2010-07-30 ENCOUNTER — Telehealth: Payer: Self-pay | Admitting: Internal Medicine

## 2010-07-31 ENCOUNTER — Telehealth: Payer: Self-pay | Admitting: Internal Medicine

## 2010-08-01 ENCOUNTER — Ambulatory Visit: Payer: Self-pay | Admitting: Internal Medicine

## 2010-08-01 LAB — CONVERTED CEMR LAB: INR: 2

## 2010-08-02 ENCOUNTER — Ambulatory Visit: Payer: Self-pay | Admitting: Psychology

## 2010-08-03 ENCOUNTER — Telehealth: Payer: Self-pay | Admitting: Cardiovascular Disease

## 2010-08-08 ENCOUNTER — Ambulatory Visit: Payer: Self-pay | Admitting: Cardiovascular Disease

## 2010-08-13 ENCOUNTER — Telehealth: Payer: Self-pay | Admitting: Cardiovascular Disease

## 2010-08-15 ENCOUNTER — Encounter: Payer: Self-pay | Admitting: Cardiovascular Disease

## 2010-08-16 ENCOUNTER — Ambulatory Visit: Payer: Self-pay | Admitting: Psychology

## 2010-08-17 ENCOUNTER — Ambulatory Visit: Payer: Self-pay | Admitting: Internal Medicine

## 2010-08-17 LAB — CONVERTED CEMR LAB: INR: 1.6

## 2010-08-28 ENCOUNTER — Telehealth: Payer: Self-pay | Admitting: Internal Medicine

## 2010-08-30 ENCOUNTER — Ambulatory Visit: Payer: Self-pay | Admitting: Psychology

## 2010-08-30 ENCOUNTER — Ambulatory Visit: Payer: Self-pay | Admitting: Family Medicine

## 2010-08-31 ENCOUNTER — Ambulatory Visit: Payer: Self-pay | Admitting: Internal Medicine

## 2010-08-31 ENCOUNTER — Telehealth: Payer: Self-pay | Admitting: Internal Medicine

## 2010-09-06 ENCOUNTER — Telehealth: Payer: Self-pay | Admitting: Internal Medicine

## 2010-09-10 ENCOUNTER — Ambulatory Visit: Payer: Self-pay | Admitting: Internal Medicine

## 2010-09-13 ENCOUNTER — Ambulatory Visit: Payer: Self-pay | Admitting: Psychology

## 2010-09-14 ENCOUNTER — Telehealth: Payer: Self-pay | Admitting: Internal Medicine

## 2010-09-18 ENCOUNTER — Ambulatory Visit: Payer: Self-pay

## 2010-09-18 ENCOUNTER — Encounter: Payer: Self-pay | Admitting: Cardiovascular Disease

## 2010-09-18 ENCOUNTER — Telehealth: Payer: Self-pay | Admitting: Cardiovascular Disease

## 2010-09-19 ENCOUNTER — Ambulatory Visit: Payer: Self-pay | Admitting: Cardiovascular Disease

## 2010-09-19 LAB — CONVERTED CEMR LAB: POC INR: 1.7

## 2010-09-20 ENCOUNTER — Telehealth: Payer: Self-pay | Admitting: Cardiovascular Disease

## 2010-09-24 ENCOUNTER — Ambulatory Visit: Payer: Self-pay | Admitting: Cardiology

## 2010-09-24 LAB — CONVERTED CEMR LAB: POC INR: 1.9

## 2010-09-27 ENCOUNTER — Ambulatory Visit: Payer: Self-pay | Admitting: Psychology

## 2010-10-01 ENCOUNTER — Ambulatory Visit: Payer: Self-pay | Admitting: Cardiovascular Disease

## 2010-10-01 ENCOUNTER — Encounter: Payer: Self-pay | Admitting: Internal Medicine

## 2010-10-01 LAB — CONVERTED CEMR LAB: POC INR: 1.5

## 2010-10-08 ENCOUNTER — Ambulatory Visit: Payer: Self-pay | Admitting: Internal Medicine

## 2010-10-08 ENCOUNTER — Ambulatory Visit: Payer: Self-pay | Admitting: Cardiology

## 2010-10-08 DIAGNOSIS — G43909 Migraine, unspecified, not intractable, without status migrainosus: Secondary | ICD-10-CM | POA: Insufficient documentation

## 2010-10-08 LAB — CONVERTED CEMR LAB
INR: 2.8
POC INR: 2.1

## 2010-10-09 ENCOUNTER — Ambulatory Visit: Payer: Self-pay | Admitting: Cardiology

## 2010-10-10 ENCOUNTER — Telehealth: Payer: Self-pay | Admitting: Internal Medicine

## 2010-10-11 ENCOUNTER — Ambulatory Visit: Payer: Self-pay | Admitting: Psychology

## 2010-10-11 LAB — CONVERTED CEMR LAB: Sed Rate: 7 mm/hr (ref 0–22)

## 2010-10-15 ENCOUNTER — Ambulatory Visit: Payer: Self-pay | Admitting: Internal Medicine

## 2010-10-15 LAB — CONVERTED CEMR LAB: POC INR: 2.5

## 2010-10-25 ENCOUNTER — Ambulatory Visit: Payer: Self-pay | Admitting: Internal Medicine

## 2010-10-25 ENCOUNTER — Ambulatory Visit: Payer: Self-pay | Admitting: Cardiovascular Disease

## 2010-10-25 ENCOUNTER — Ambulatory Visit: Payer: Self-pay | Admitting: Psychology

## 2010-10-25 LAB — CONVERTED CEMR LAB: POC INR: 2.6

## 2010-11-02 ENCOUNTER — Emergency Department (HOSPITAL_COMMUNITY)
Admission: EM | Admit: 2010-11-02 | Discharge: 2010-11-02 | Payer: Self-pay | Source: Home / Self Care | Admitting: Emergency Medicine

## 2010-11-07 ENCOUNTER — Ambulatory Visit
Admission: RE | Admit: 2010-11-07 | Discharge: 2010-11-07 | Payer: Self-pay | Source: Home / Self Care | Attending: Internal Medicine | Admitting: Internal Medicine

## 2010-11-08 ENCOUNTER — Ambulatory Visit: Payer: Self-pay | Admitting: Psychology

## 2010-11-22 ENCOUNTER — Ambulatory Visit
Admission: RE | Admit: 2010-11-22 | Discharge: 2010-11-22 | Payer: Self-pay | Source: Home / Self Care | Attending: Psychology | Admitting: Psychology

## 2010-11-22 ENCOUNTER — Ambulatory Visit: Admission: RE | Admit: 2010-11-22 | Discharge: 2010-11-22 | Payer: Self-pay | Source: Home / Self Care

## 2010-11-22 LAB — CONVERTED CEMR LAB: POC INR: 2

## 2010-12-02 ENCOUNTER — Encounter: Payer: Self-pay | Admitting: Cardiovascular Disease

## 2010-12-03 ENCOUNTER — Telehealth: Payer: Self-pay | Admitting: Internal Medicine

## 2010-12-06 ENCOUNTER — Ambulatory Visit
Admission: RE | Admit: 2010-12-06 | Discharge: 2010-12-06 | Payer: Self-pay | Source: Home / Self Care | Attending: Psychology | Admitting: Psychology

## 2010-12-09 LAB — CONVERTED CEMR LAB
BUN: 19 mg/dL (ref 6–23)
CO2: 30 meq/L (ref 19–32)
Calcium: 9.4 mg/dL (ref 8.4–10.5)
Chloride: 106 meq/L (ref 96–112)
Creatinine, Ser: 1.4 mg/dL (ref 0.4–1.5)
GFR calc non Af Amer: 63.94 mL/min (ref >60)
Glucose, Bld: 96 mg/dL (ref 70–99)
Potassium: 4.2 meq/L (ref 3.5–5.1)
Sodium: 141 meq/L (ref 135–145)

## 2010-12-11 NOTE — Miscellaneous (Signed)
  Clinical Lists Changes  Orders: Added new Referral order of Misc. Referral (Misc. Ref) - Signed 

## 2010-12-11 NOTE — Progress Notes (Signed)
Summary: Calling regarding work   Phone Note Call from Patient Call back at [phone]   Caller: Patient Summary of Call: Pt request call regarding going back to work Initial call taken by: Judie Grieve,  June 26, 2010 8:10 AM  Follow-up for Phone Call        Left message to call back Deliah Goody, RN  June 26, 2010 8:55 AM  spoke with pt, his insurance company has been in contact with dr glower @ [facility] and they were told pt could return to work 06-28-10. his f/u appt with dr Eden Emms post surgery is 07-10-10. pt is bringing paperwork by for not to return to work before seen. Deliah Goody, RN  June 26, 2010 9:11 AM

## 2010-12-11 NOTE — Medication Information (Signed)
Summary: [facility] Z[facility]em Tartrate[facility]  [facility]   Imported By: Maryln Gottron 04/13/2010 09:52:46  _____________________________________________________________________  External Attachment:    Type:   Image     Comment:   External Document

## 2010-12-11 NOTE — Progress Notes (Signed)
Summary: Calling regarding his wrist   Phone Note Call from Patient Call back at Home Phone [phone] Call back at Work Phone [phone]   Caller: Patient Summary of Call: Pt calling regarding his swollen wrist Initial call taken by: Judie Grieve,  April 13, 2010 10:45 AM  Follow-up for Phone Call        spoke with pt, when he was at the last office visit, he and dr Eden Emms had discussed an area on his wrist that is swollen and painful. pt was seen by the orthopedic and the scans that were done did not show any tendenitis or any clots. he states it is still tender and he just wanted to let dr Eden Emms know Deliah Goody, RN  April 13, 2010 1:29 PM   Additional Follow-up for Phone Call Additional follow up Details #1::        ok Additional Follow-up by: Colon Branch, MD, [facility],  April 15, 2010 11:27 PM

## 2010-12-11 NOTE — Letter (Signed)
Summary: Previsit letter  [facility]  [address] [place], [place] [address]   Phone: [phone]  Fax: [phone]       02/22/2010 MRN: [id number]  [facility] [address] [place], [place]  [address]  Dear Mr. Snavely,  Welcome to the Gastroenterology Division at [facility].    You are scheduled to see a nurse for your pre-procedure visit on 03-15-10 at 8:00a.m. on the 3rd floor at [facility], [address]  We ask that you try to arrive at our office 15 minutes prior to your appointment time to allow for check-in.  Your nurse visit will consist of discussing your medical and surgical history, your immediate family medical history, and your medications.    Please bring a complete list of all your medications or, if you prefer, bring the medication bottles and we will list them.  We will need to be aware of both prescribed and over the counter drugs.  We will need to know exact dosage information as well.  If you are on blood thinners (Coumadin, Plavix, Aggrenox, Ticlid, etc.) please call our office today/prior to your appointment, as we need to consult with your physician about holding your medication.   Please be prepared to read and sign documents such as consent forms, a financial agreement, and acknowledgement forms.  If necessary, and with your consent, a friend or relative is welcome to sit-in on the nurse visit with you.  Please bring your insurance card so that we may make a copy of it.  If your insurance requires a referral to see a specialist, please bring your referral form from your primary care physician.  No co-pay is required for this nurse visit.     If you cannot keep your appointment, please call [phone] to cancel or reschedule prior to your appointment date.  This allows [name] the opportunity to schedule an appointment for another patient in need of care.    Thank you for choosing [facility] for your medical needs.  We  appreciate the opportunity to care for you.  Please visit [name] at our website  to learn more about our practice.                     Sincerely.                                                                                                                   The Gastroenterology Division

## 2010-12-11 NOTE — Progress Notes (Signed)
Summary: calling re paperwork   Phone Note Call from Patient   Caller: Patient  [phone] Reason for Call: Talk to Nurse Summary of Call: pt calling re paperwork he dropped off -pl scall [phone] Initial call taken by: [name],  June 27, 2010 4:36 PM  Follow-up for Phone Call        I left a message with [employer] to call back.  Form located in Medical Records and routed to [facility] for completion.  I spoke with the pt and made him aware that his forms will be completed by [employer] and can take up to 7 days.  The pt said if he did not get these forms back ASAP then his job was going to start using his vacation time.  Message sent to [employer] to complete forms ASAP. Follow-up by: Julieta Gutting, RN, BSN,  June 27, 2010 4:54 PM

## 2010-12-11 NOTE — Letter (Signed)
Summary: Return To Work  [employer], Main Office  [address]   [place], [place] [address]   Phone: [phone]  Fax: [phone]    06/29/2010  TO: Leodis Sias IT MAY CONCERN   RE: [name] [address] [place]   The above named individual is under my medical care following heart surgery. He will not be able to return to work until seen on 07-10-10.  If you have any further questions or need additional information, please call.     Sincerely,   Dr Charlton Haws Deliah Goody, RN

## 2010-12-11 NOTE — Progress Notes (Signed)
Summary: flying ?  Phone Note Call from Patient Call back at Work Phone [phone]   Caller: [facility] mail Summary of Call: has ? about flying, which he is flying on this Sunday. Please return call. Initial call taken by: Warnell Forester,  August 28, 2010 9:30 AM  Follow-up for Phone Call        there is no medical reason why he cannot fly. Follow-up by: [name],  August 28, 2010 3:21 PM  Additional Follow-up for Phone Call Additional follow up Details #1::        pt given Dr. Marliss Coots recommendations. Additional Follow-up by: Lynann Beaver CMA,  August 28, 2010 3:38 PM

## 2010-12-11 NOTE — Letter (Signed)
Summary: [facility] Surgery [facility] Note   [facility] Thoracic Surgery Clinic Note   Imported By: Roderic Ovens 05/25/2010 12:59:01  _____________________________________________________________________  External Attachment:    Type:   Image     Comment:   External Document

## 2010-12-11 NOTE — Assessment & Plan Note (Signed)
Summary: per check out/sf      Allergies Added:   Referring Provider:  Birdie Sons, MD  Primary Provider:  Birdie Sons, MD   CC:  dizziness.  History of Present Illness: [name] is seen today post AVR.  I reviewed his notes from [place].  Dr Silvestre Mesi put a 25mm tissue valve in him via a right thoracotomy.  He had a brief episode of PAF and was on Amiodarone.  He is still on lasix and potassium which can likely be stopped.  He will discuss this with Dr Cato Mulligan.  He denies SSCP and has less palpable heart sensations at night.  He needs a refill for Trazadone which he forgot on the airplane on a recent trip.  He needs an ECG to reassess his conduction system.  Also needs a F/U echo to assess valve competancy as a baseline and EF.  He is walking wihout difficulty.  Ok to work 1/2 days for the next two weeks.  Driving restrictions not as important since he did not have a sternotomy.  No syncope, dyspnea, fever or edema  Current Problems (verified): 1)  Preventive Health Care  (ICD-V70.0) 2)  Gerd  (ICD-530.81) 3)  Atrial Premature Beats  (ICD-427.61) 4)  Hypertension  (ICD-401.9) 5)  Hyperlipidemia  (ICD-272.4) 6)  Atrial Fibrillation  (ICD-427.31) 7)  Mitral Regurgitation  (ICD-396.3) 8)  Hypercholesterolemia  (ICD-272.0) 9)  Insomnia-sleep Disorder-unspec  (ICD-780.52) 10)  Anxiety  (ICD-300.00) 11)  Aortic Regurgitation, Moderate  (ICD-424.1) 12)  Prostate Cancer, Hx of  (ICD-V10.46)  Current Medications (verified): 1)  Lorazepam 0.5 Mg Tabs (Lorazepam) .... Take 1/2-1 Tablet By Mouth Once A Day As Needed 2)  Citalopram Hydrobromide 40 Mg  Tabs (Citalopram Hydrobromide) .... One By Mouth Daily 3)  Trazodone Hcl 50 Mg Tabs (Trazodone Hcl) .... Take 1/2-1 By Mouth At Bedtime As Needed 4)  Aspirin 81 Mg  Tabs (Aspirin) .... Daily 5)  Aciphex 20 Mg Tbec (Rabeprazole Sodium) .... One By Mouth Q Day 6)  Klor-Con M20 20 Meq Cr-Tabs (Potassium Chloride Crys Cr) .... Take 1 Tablet By Mouth Once A  Day 7)  Metoprolol Succinate 25 Mg Xr24h-Tab (Metoprolol Succinate) .... Take 1 Tablet By Mouth Once A Day 8)  Furosemide 20 Mg Tabs (Furosemide) .... Take 1 Tablet By Mouth Once A Day 9)  Zydone 10-400 Mg Tabs (Hydrocodone-Acetaminophen) .... 1/2 Every Six Hours As Needed Pain  Allergies (verified): 1)  ! Morphine Sulfate Cr (Morphine Sulfate) 2)  ! Oxycontin (Oxycodone Hcl) 3)  ! Aspirin 4)  ! Lortab 5)  ! Micardis Hct (Telmisartan-Hctz) 6)  Lipitor (Atorvastatin Calcium) 7)  Altace (Ramipril) 8)  Codeine Phosphate (Codeine Phosphate)  Past History:  Past Medical History: Last updated: 03/27/2010 Current Problems:  HYPERTENSION (ICD-401.9) HYPERLIPIDEMIA (ICD-272.4) ATRIAL FIBRILLATION (ICD-427.31) MITRAL REGURGITATION (ICD-396.3) HYPERCHOLESTEROLEMIA (ICD-272.0) INSOMNIA-SLEEP DISORDER-UNSPEC (ICD-780.52) ANXIETY (ICD-300.00) AORTIC REGURGITATION, MODERATE (ICD-424.1) PROSTATE CANCER, HX OF (ICD-V10.46) ANTICOAGULATION THERAPY (ICD-V58.61)  Past Surgical History: Last updated: 06/25/2010 angiogram-"no blockage", pt's report  Erectile dysfunction   penile implant   Prostate surgery  prostatectomy Cataract extraction--left 12/06/09; right 09/14/09 AVR---[facility] 2011  Family History: Last updated: 01/09/2010 Family History High cholesterol Family History Hypertension Family History of Prostate CA 1st degree relative <50 sister---uterine Cancer No FH of Colon Cancer:  Social History: Last updated: 01/09/2010 Occupation:IT [employer]  Single No childern Patient is a former smoker.  Alcohol Use - no Daily Caffeine Use: 2-3 daily  Illicit Drug Use - no  Review of Systems  Denies fever, malais, weight loss, blurry vision, decreased visual acuity, cough, sputum, SOB, hemoptysis, pleuritic pain, palpitaitons, heartburn, abdominal pain, melena, lower extremity edema, claudication, or rash.   Vital Signs:  Patient profile:   65 year old male Height:      70  inches Weight:      193 pounds BMI:     27.79 Pulse rate:   69 / minute Resp:     12 per minute BP sitting:   134 / 89  (left arm)  Vitals Entered By: Kem Parkinson (July 10, 2010 2:58 PM)  Physical Exam  General:  Affect appropriate Healthy:  appears stated age HEENT: normal Neck supple with no adenopathy JVP normal no bruits no thyromegaly Lungs clear with no wheezing and good diaphragmatic motion Heart:  S1/S2 no murmur,rub, gallop or click PMI normal Abdomen: benighn, BS positve, no tenderness, no AAA no bruit.  No HSM or HJR Distal pulses intact with no bruits No edema Neuro non-focal Skin warm and dry Right thoracotomy well healed   Impression & Recommendations:  Problem # 1:  ATRIAL PREMATURE BEATS (ICD-427.61) Maint NSR off Amiodarone The following medications were removed from the medication list:    Amiodarone Hcl 200 Mg Tabs (Amiodarone hcl) .Marland Kitchen... Take 1 tablet by mouth once a day His updated medication list for this problem includes:    Aspirin 81 Mg Tabs (Aspirin) .Marland Kitchen... Daily    Metoprolol Succinate 25 Mg Xr24h-tab (Metoprolol succinate) .Marland Kitchen... Take 1 tablet by mouth once a day  Problem # 2:  HYPERTENSION (ICD-401.9) Well controlled  consider stopping diuretic on primary f/U His updated medication list for this problem includes:    Aspirin 81 Mg Tabs (Aspirin) .Marland Kitchen... Daily    Metoprolol Succinate 25 Mg Xr24h-tab (Metoprolol succinate) .Marland Kitchen... Take 1 tablet by mouth once a day    Furosemide 20 Mg Tabs (Furosemide) .Marland Kitchen... Take 1 tablet by mouth once a day  Problem # 3:  AORTIC REGURGITATION, MODERATE (ICD-424.1) S/P Tissue AVR  F/U echo for baseline and EF  ECG shows new RBBB but no high grade heart block.  Discussed  this with patient.  Will do F/U ECG in 2 months His updated medication list for this problem includes:    Metoprolol Succinate 25 Mg Xr24h-tab (Metoprolol succinate) .Marland Kitchen... Take 1 tablet by mouth once a day    Furosemide 20 Mg Tabs  (Furosemide) .Marland Kitchen... Take 1 tablet by mouth once a day  Orders: Echocardiogram (Echo)  Patient Instructions: 1)  Your physician recommends that you schedule a follow-up appointment in: 2 MONTHS 2)  Your physician has requested that you have an echocardiogram.  Echocardiography is a painless test that uses sound waves to create images of your heart. It provides your doctor with information about the size and shape of your heart and how well your heart's chambers and valves are working.  This procedure takes approximately one hour. There are no restrictions for this procedure. Prescriptions: TRAZODONE HCL 50 MG TABS (TRAZODONE HCL) Take 1/2-1 by mouth at bedtime as needed  #30 x 2   Entered by:   Deliah Goody, RN   Authorized by:   Colon Branch, MD, [facility]   Signed by:   Deliah Goody, RN on 07/10/2010   Method used:   [facility]        [facility]  [phone]* (retail)       [address] [place], [place]  [address]  Ph: [phone] or [phone]       Fax: [phone]   RxID:   [id number]    EKG Report  Procedure date:  07/10/2010  Findings:      NSR 74 RBBB LVH RBBB new since 2010

## 2010-12-11 NOTE — Progress Notes (Signed)
Summary: ok to fly?  Phone Note Call from Patient   Caller: Patient Call For: [name] Summary of Call: Pt is asking if it is ok to fly to [facility]? [phone] Initial call taken by: Lynann Beaver CMA,  June 28, 2010 10:37 AM  Follow-up for Phone Call        yes  Lynann Beaver [facility]  June 28, 2010 1:31 PM  Follow-up by: [name],  June 28, 2010 12:57 PM

## 2010-12-11 NOTE — Medication Information (Signed)
Summary: rov coumadin - [facility]      Allergies Added:  Anticoagulant Therapy  Managed by: Weston Brass, PharmD Referring MD: Dr. Charlton Haws PCP: Birdie Sons, MD  Supervising MD: Shirlee Latch MD, Freida Busman Indication 1: V58.83,V58.61,415.19 Indication 2: DVT INR POC 1.9 INR RANGE 2.0-3.0  Dietary changes: no    Health status changes: no    Bleeding/hemorrhagic complications: no    Recent/future hospitalizations: no    Any changes in medication regimen? yes       Details: Pt useing Lovenox injections s/p CT angio  Recent/future dental: no  Any missed doses?: no       Is patient compliant with meds? yes       Allergies (verified): 1)  ! Morphine Sulfate Cr (Morphine Sulfate) 2)  ! Oxycontin (Oxycodone Hcl) 3)  ! Aspirin 4)  ! Lortab 5)  ! Micardis Hct (Telmisartan-Hctz) 6)  Lipitor (Atorvastatin Calcium) 7)  Altace (Ramipril) 8)  Codeine Phosphate (Codeine Phosphate) 9)  Buspirone Hcl (Buspirone Hcl)  Anticoagulation Management History:      The patient is taking warfarin and comes in today for a routine follow up visit.  Negative risk factors for bleeding include an age less than 29 years old.  The bleeding index is 'low risk'.  Positive CHADS2 values include History of HTN.  Negative CHADS2 values include Age > 23 years old.  His last INR was 1.6.  Anticoagulation responsible provider: Shirlee Latch MD, Jeda Pardue.  INR POC: 1.9.  Cuvette Lot#: [id number].  Exp: 09/2011.    Anticoagulation Management Assessment/Plan:      The patient's current anticoagulation dose is Coumadin 5 mg tabs: take one and half tab by mouth once daily.  The target INR is 2.0-3.0.  The next INR is due 10/01/2010.  Results were reviewed/authorized by Weston Brass, PharmD.  He was notified by Hoy Register, PharmD Candidate.         Prior Anticoagulation Instructions: INR 1.7  Coumadin 5mg  tabs, take 2 tabs each day as well as Lovenox 90mg  inject subcutaneously every 12hr for the next 5 days  Current Anticoagulation  Instructions: INR 1.9 Take 3 tablets today then take 2 tablets everyday except 3 tablets on Wednesday. Recheck INR in 1 week.

## 2010-12-11 NOTE — Progress Notes (Signed)
Summary: question  Phone Note From Pharmacy Call back at [phone]; fax [phone]   [facility] Call For: Siler Mavis  Summary of Call: Pt could save money if aciphex 20 was changed to lansoprazole, omeprazole, or pantoprazole. Initial call taken by: Gladis Riffle, RN,  April 12, 2010 9:56 AM  Follow-up for Phone Call        per dr Laiya Wisby check with pt if ok.Gladis Riffle, RN  April 12, 2010 9:56 AM    per pt ok to try cheaper one.  Please advise which and dose.  Follow-up by: Gladis Riffle, RN,  April 12, 2010 9:58 AM  Additional Follow-up for Phone Call Additional follow up Details #1::        omeprazole 20 mg by mouth once daily #100/3 Additional Follow-up by: [name],  April 12, 2010 10:14 AM    New/Updated Medications: OMEPRAZOLE 20 MG CPDR (OMEPRAZOLE) Take 1 tablet by mouth once a day Prescriptions: OMEPRAZOLE 20 MG CPDR (OMEPRAZOLE) Take 1 tablet by mouth once a day  #100 x 3   Entered by:   Gladis Riffle, RN   Authorized by:   [name]   Signed by:   Gladis Riffle, RN on 04/12/2010   Method used:   [facility] to        [facility]  [phone]* (retail)       [address] [place], [place]  [address]       Ph: [phone] or [phone]       Fax: [phone]   RxID:   [id number]

## 2010-12-11 NOTE — Progress Notes (Signed)
Summary: staples  Phone Note Call from Patient   Caller: Patient Call For: [name] Summary of Call: Heart Valve Replacement, and is asking if Dr. Cato Mulligan can remove these???? [phone] Initial call taken by: Lynann Beaver CMA,  June 19, 2010 9:58 AM  Follow-up for Phone Call        yes---nurse OV Follow-up by: [name],  June 19, 2010 11:54 AM  Additional Follow-up for Phone Call Additional follow up Details #1::        Pt. notified. Additional Follow-up by: Lynann Beaver CMA,  June 19, 2010 12:09 PM

## 2010-12-11 NOTE — Miscellaneous (Signed)
Summary: clotest  Clinical Lists Changes  Orders: Added new Test order of TLB-H Pylori Screen Gastric Biopsy (83013-CLOTEST) - Signed 

## 2010-12-11 NOTE — Miscellaneous (Signed)
Summary: Appointment No Show  Appointment status changed to no show by [employer] on 07/24/2010 4:53 PM.  No Show Comments ---------------- ECHO/AVR/424.0/DM  Appointment Information ----------------------- Appt Type:  CARDIOLOGY ANCILLARY VISIT      Date:  Tuesday, July 24, 2010      Time:  4:00 PM for 60 min   Urgency:  Routine   Made By:  Pearson Grippe  To Visit:  [id number]    Reason:  ECHO/AVR/424.0/DM  Appt Comments ------------- -- 07/24/10 16:53: ([facility]EM[facility]) NO SHOW -- ECHO/AVR/424.0/DM -- 07/10/10 15:22: ([facility]) BOOKED -- Routine CARDIOLOGY ANCILLARY VISIT at 07/24/2010 4:00 PM for 60 min ECHO/AVR/424.0/DM

## 2010-12-11 NOTE — Assessment & Plan Note (Signed)
Summary: 1 month fup//ccm/nausea/dizzy/pt rsc/cjr   Vital Signs:  Patient profile:   65 year old male Weight:      203 pounds Temp:     98.7 degrees F oral Pulse rate:   78 / minute Pulse rhythm:   regular BP sitting:   120 / 80  Vitals Entered By: Lynann Beaver CMA AAMA (September 10, 2010 11:52 AM) CC: discuss meds Is Patient Diabetic? No Pain Assessment Patient in pain? no        CC:  discuss meds.  Current Medications (verified): 1)  Lorazepam 0.5 Mg Tabs (Lorazepam) .... Take 1/2-1 Tablet By Mouth Once A Day As Needed 2)  Trazodone Hcl 50 Mg Tabs (Trazodone Hcl) .... Take 1/2-1 By Mouth At Bedtime As Needed 3)  Aspirin 81 Mg  Tabs (Aspirin) .... Daily 4)  Aciphex 20 Mg Tbec (Rabeprazole Sodium) .... One By Mouth Q Day 5)  Metoprolol Succinate 25 Mg Xr24h-Tab (Metoprolol Succinate) .... Take 1 Tablet By Mouth Once A Day 6)  Coumadin 5 Mg Tabs (Warfarin Sodium) .... Take One and Half Tab By Mouth Once Daily 7)  Fluticasone Propionate 50 Mcg/act  Susp (Fluticasone Propionate) .... 2 Sprays Each Nostril Once Daily 8)  Effexor Xr 75 Mg Xr24h-Cap (Venlafaxine Hcl) .... One By Mouth Daily  Allergies: 1)  ! Morphine Sulfate Cr (Morphine Sulfate) 2)  ! Oxycontin (Oxycodone Hcl) 3)  ! Aspirin 4)  ! Lortab 5)  ! Micardis Hct (Telmisartan-Hctz) 6)  Lipitor (Atorvastatin Calcium) 7)  Altace (Ramipril) 8)  Codeine Phosphate (Codeine Phosphate) 9)  Buspirone Hcl (Buspirone Hcl)   Impression & Recommendations:  Problem # 1:  ANXIETY (ICD-300.00) 20 minute discussion all FTF regarding anxiety discussed options will try to use benzo only side effects discussed His updated medication list for this problem includes:    Lorazepam 0.5 Mg Tabs (Lorazepam) .Marland Kitchen... Take 1/2-1 tablet by mouth once a day as needed    Trazodone Hcl 50 Mg Tabs (Trazodone hcl) .Marland Kitchen... Take 1/2-1 by mouth at bedtime as needed  Complete Medication List: 1)  Lorazepam 0.5 Mg Tabs (Lorazepam) .... Take 1/2-1  tablet by mouth once a day as needed 2)  Aspirin 81 Mg Tabs (Aspirin) .... Daily 3)  Aciphex 20 Mg Tbec (Rabeprazole sodium) .... Take one tablet by mouth every other day for 14 days and then 1 every 3rd day for 14 days and then stop 4)  Metoprolol Succinate 25 Mg Xr24h-tab (Metoprolol succinate) .... Take 1 tablet by mouth once a day 5)  Coumadin 5 Mg Tabs (Warfarin sodium) .... Take one and half tab by mouth once daily 6)  Fluticasone Propionate 50 Mcg/act Susp (Fluticasone propionate) .... 2 sprays each nostril once daily  Patient Instructions: 1)  Please schedule a follow-up appointment in 1 month.   Orders Added: 1)  Est. Patient Level III [81191]

## 2010-12-11 NOTE — Letter (Signed)
Summary: Cardioversion/TEE Instructions  [employer] [employer], Main Office  [address]   [place], [place] [address]   Phone: [phone]  Fax: [phone]    Cardioversion / TEE Cardioversion Instructions  You are scheduled for a  TEE  on ______________6/13/11______________ with Dr. ________NISHAN_________________________________.   Please arrive at the [facility] at ________9:30_____ a.m. / p.m. on the day of your procedure.  1)   DIET:  A)   Nothing to eat or drink after midnight except your medications with a sip of water.  B)   May have clear liquid breakfast, then nothing to eat or drink after _________ a.m. / p.m.      Clear liquids include:  water, broth, Sprite, Ginger Ale, black coffee, tea (no sugar),      cranberry / grape / apple juice, jello (not red), popsicle from clear juices (not red).  2)   Come to the [place] office on ____________________ for lab work. The lab at [facility] is open from 8:30 a.m. to 1:30 p.m. and 2:30 p.m. to 5:00 p.m. The lab at [facility] is open from 7:30 a.m. to 5:30 p.m. You do not have to be fasting.  4)   A)   DO NOT TAKE these medications before your procedure:      ___________________________________________________________________     ___________________________________________________________________     ___________________________________________________________________  B)   YOU MAY TAKE ALL of your remaining medications with a small amount of water.    C)   START NEW medications:       ___________________________________________________________________     ___________________________________________________________________  5)  Must have a responsible person to drive you home.  6)   Bring a current list of your medications and current insurance cards.   * Special Note:  Every effort is made to have your procedure done on time. Occasionally there are  emergencies that present themselves at the hospital that may cause delays. Please be patient if a delay does occur.  * If you have any questions after you get home, please call the office at [phone].

## 2010-12-11 NOTE — Assessment & Plan Note (Signed)
Summary: Large, visible vein left side of head x 1 day/et   Vital Signs:  Patient profile:   65 year old male Pulse rate:   80 / minute Pulse rhythm:   regular Resp:     12 per minute BP sitting:   122 / 50  (left arm) Cuff size:   regular  Vitals Entered By: Gladis Riffle, RN (February 28, 2010 1:41 PM) CC: c/o large visible vein on left side of head x 2 days Is Patient Diabetic? No   Primary Care Provider:  Birdie Sons, MD   CC:  c/o large visible vein on left side of head x 2 days.  History of Present Illness: concerned with large vein left side of scalp unsure how long it has been present. receding hairline has made vein visible no pain no other associated sxs  All other systems reviewed and were negative   Preventive Screening-Counseling & Management  Alcohol-Tobacco     Smoking Status: quit     Year Quit: 1980  Current Medications (verified): 1)  Lorazepam 0.5 Mg Tabs (Lorazepam) .... Take 1/2-1 Tablet By Mouth Once A Day As Needed 2)  Lisinopril 10 Mg  Tabs (Lisinopril) .... 2 By Mouth Once Daily 3)  Citalopram Hydrobromide 40 Mg  Tabs (Citalopram Hydrobromide) .... One By Mouth Daily 4)  Trazodone Hcl 50 Mg Tabs (Trazodone Hcl) .... Take 1/2-1 By Mouth At Bedtime As Needed 5)  Aspirin 81 Mg  Tabs (Aspirin) .... Daily 6)  Aciphex 20 Mg Tbec (Rabeprazole Sodium) .... Take 1 Tab Every Morning 7)  Omnaris 50 Mcg/act Susp (Ciclesonide) .... Use Daily  Allergies: 1)  ! Morphine Sulfate Cr (Morphine Sulfate) 2)  ! Oxycontin (Oxycodone Hcl) 3)  ! Aspirin 4)  ! Lortab 5)  ! Micardis Hct (Telmisartan-Hctz) 6)  Lipitor (Atorvastatin Calcium) 7)  Altace (Ramipril) 8)  Codeine Phosphate (Codeine Phosphate)  Past History:  Past Medical History: Last updated: 04/07/2009 Current Problems:  HYPERTENSION (ICD-401.9) HYPERLIPIDEMIA (ICD-272.4) ATRIAL FIBRILLATION (ICD-427.31) MITRAL REGURGITATION (ICD-396.3) MURMUR (ICD-785.2) HYPERCHOLESTEROLEMIA  (ICD-272.0) INSOMNIA-SLEEP DISORDER-UNSPEC (ICD-780.52) ANXIETY (ICD-300.00) AORTIC REGURGITATION, MODERATE (ICD-424.1) PROSTATE CANCER, HX OF (ICD-V10.46) ANTICOAGULATION THERAPY (ICD-V58.61) NIGHTMARES (ICD-307.47) BACK PAIN (ICD-724.5)  Past Surgical History: Last updated: 12/11/2009 angiogram-"no blockage", pt's report  Erectile dysfunction   penile implant   Prostate surgery  prostatectomy Cataract extraction--left 12/06/09; right 09/14/09  Family History: Last updated: 01/09/2010 Family History High cholesterol Family History Hypertension Family History of Prostate CA 1st degree relative <50 sister---uterine Cancer No FH of Colon Cancer:  Social History: Last updated: 01/09/2010 Occupation:IT [employer]  Single No childern Patient is a former smoker.  Alcohol Use - no Daily Caffeine Use: 2-3 daily  Illicit Drug Use - no  Risk Factors: Exercise: yes (06/23/2007)  Risk Factors: Smoking Status: quit (02/28/2010)  Physical Exam  General:  Well-developed,well-nourished,in no acute distress; alert,appropriate and cooperative throughout examination Head:  normocephalic and atraumatic. visible vein left scalp non tender   Eyes:  pupils equal and pupils round.   Pulses:  temporal artery pulses normal bilaterally   Impression & Recommendations:  Problem # 1:  R/O ARTERITIS (ICD-447.6) i suspect this will be a normal finding check esr pt reassured Orders: Venipuncture (57846) TLB-Sedimentation Rate (ESR) (85652-ESR)  Complete Medication List: 1)  Lorazepam 0.5 Mg Tabs (Lorazepam) .... Take 1/2-1 tablet by mouth once a day as needed 2)  Lisinopril 10 Mg Tabs (Lisinopril) .... 2 by mouth once daily 3)  Citalopram Hydrobromide 40 Mg Tabs (Citalopram hydrobromide) .... One by  mouth daily 4)  Trazodone Hcl 50 Mg Tabs (Trazodone hcl) .... Take 1/2-1 by mouth at bedtime as needed 5)  Aspirin 81 Mg Tabs (Aspirin) .... Daily 6)  Aciphex 20 Mg Tbec (Rabeprazole  sodium) .... Take 1 tab every morning 7)  Omnaris 50 Mcg/act Susp (Ciclesonide) .... Use daily

## 2010-12-11 NOTE — Consult Note (Signed)
Summary: [facility]  [facility]   Imported By: Maryln Gottron 10/10/2010 13:32:54  _____________________________________________________________________  External Attachment:    Type:   Image     Comment:   External Document

## 2010-12-11 NOTE — Consult Note (Signed)
Summary: [place]   [place]   Imported By: Roderic Ovens 09/05/2010 12:31:44  _____________________________________________________________________  External Attachment:    Type:   Image     Comment:   External Document

## 2010-12-11 NOTE — Medication Information (Signed)
Summary: rov/sl      Allergies Added:  Anticoagulant Therapy  Managed by: Bethena Midget, RN, BSN Referring MD: Dr. Charlton Haws PCP: Birdie Sons, MD  Supervising MD: Riley Kill MD,Renly Roots Indication 1: DVT Indication 2: Pulmonary Embolism Lab Used: [facility] [place] Site: [address] INR POC 2.1 INR RANGE 2.0-3.0  Dietary changes: no    Health status changes: no    Bleeding/hemorrhagic complications: no    Recent/future hospitalizations: no    Any changes in medication regimen? no    Recent/future dental: no  Any missed doses?: no       Is patient compliant with meds? yes       Current Medications (verified): 1)  Lorazepam 0.5 Mg Tabs (Lorazepam) .... Take 1/2-1 Tablet By Mouth Once A Day As Needed 2)  Aspirin 81 Mg  Tabs (Aspirin) .... Daily 3)  Aciphex 20 Mg Tbec (Rabeprazole Sodium) .... Take One Tablet By Mouth Every Other Day For 14 Days and Then 1 Every 3rd Day For 14 Days and Then Stop 4)  Metoprolol Succinate 25 Mg Xr24h-Tab (Metoprolol Succinate) .... Take 1/2  Tablet By Mouth Once A Day 5)  Coumadin 5 Mg Tabs (Warfarin Sodium) .... Take One and Half Tab By Mouth Once Daily  Allergies (verified): 1)  ! Morphine Sulfate Cr (Morphine Sulfate) 2)  ! Oxycontin (Oxycodone Hcl) 3)  ! Aspirin 4)  ! Lortab 5)  ! Micardis Hct (Telmisartan-Hctz) 6)  Lipitor (Atorvastatin Calcium) 7)  Altace (Ramipril) 8)  Codeine Phosphate (Codeine Phosphate) 9)  Buspirone Hcl (Buspirone Hcl)  Anticoagulation Management History:      The patient is taking warfarin and comes in today for a routine follow up visit.  Negative risk factors for bleeding include an age less than 4 years old.  The bleeding index is 'low risk'.  Positive CHADS2 values include History of HTN.  Negative CHADS2 values include Age > 68 years old.  His last INR was 1.6 and today's INR is 2.8.  Anticoagulation responsible provider: Riley Kill MD,Kethan Papadopoulos.  INR POC: 2.1.  Cuvette Lot#: [id number].  Exp:  09/2011.    Anticoagulation Management Assessment/Plan:      The patient's current anticoagulation dose is Coumadin 5 mg tabs: take one and half tab by mouth once daily.  The target INR is 2.0-3.0.  The next INR is due 10/15/2010.  Anticoagulation instructions were given to patient.  Results were reviewed/authorized by Bethena Midget, RN, BSN.  He was notified by Bethena Midget, RN, BSN.         Prior Anticoagulation Instructions: INR 1.5  Take Coumadin 3 tabs (15 mg) today,  tomorrow, and Wednesday (November 21st, 22nd, 23).  Then take Coumadin 2 tabs (10 mg) daily until return to clinic on Monday afternoon.    Current Anticoagulation Instructions: INR 2.1 Change dose to 10mg s daily except 15mg s on Mondays and Fridays. REcheck in 7-10 days.

## 2010-12-11 NOTE — Progress Notes (Signed)
Summary: calling re rtn to wrk note   Phone Note Call from Patient   Caller: Patient Reason for Call: Talk to Nurse Summary of Call: pt now states he doesn't have to have the form he brought in to rtn to work filled out, his company states a rtn to work note signed by the dr is fine-pls call when ready [phone] Initial call taken by: [name],  June 28, 2010 8:21 AM  Follow-up for Phone Call        pt d/c'd on 06/06/10 and on discharge papers has to be out 3-6weeks.  When can he return to work? Please review and write note with return to work date.  Has follow up on 07/10/10 at 3pm for post hospital visit.  Will fax to [facility] as to Dr Fabio Bering decision at [phone] [name]  Additional Follow-up for Phone Call Additional follow up Details #1::        lertter generated and faxed to the number given Deliah Goody, RN  June 29, 2010 12:06 PM

## 2010-12-11 NOTE — Progress Notes (Signed)
Summary: CT results and continued headache  Phone Note Call from Patient Call back at Work Phone [phone]   Caller: Patient Call For: [name] Summary of Call: Pt is taking Tylenol 500 mg 6-8 daily and is not getting relief.  Needs official reading of CT. Initial call taken by: Lynann Beaver CMA AAMA,  October 10, 2010 3:13 PM  Follow-up for Phone Call        CT normal call him in Ultram 50mg  three times a day as needed for headache No refill, call back if no better by Friday per Dr Cato Mulligan Follow-up by: Alfred Levins, CMA,  October 10, 2010 4:11 PM  Additional Follow-up for Phone Call Additional follow up Details #1::        Pt believes he has migraines and wonders if Serotonin may help.     [facility] Additional Follow-up by: Lynann Beaver CMA AAMA,  October 10, 2010 4:16 PM    Additional Follow-up for Phone Call Additional follow up Details #2::    i think he has migraines too--ultram should help (improves serotonin too) Follow-up by: [name],  October 10, 2010 5:29 PM  New/Updated Medications: ULTRAM 50 MG TABS (TRAMADOL HCL) one by mouth three times a day as needed headache Prescriptions: ULTRAM 50 MG TABS (TRAMADOL HCL) one by mouth three times a day as needed headache  #30 x 1   Entered by:   Lynann Beaver CMA AAMA   Authorized by:   [name]   Signed by:   Lynann Beaver CMA AAMA on 10/10/2010   Method used:   Electronically to        [facility]  [phone]* (retail)       [address] [place], [place]  [address]       Ph: [phone] or [phone]       Fax: [phone]   RxID:   [id number]  Pt. notified.

## 2010-12-11 NOTE — Progress Notes (Signed)
Summary: needs rtn to work note **LMTCB**   Phone Note Call from Patient   Caller: Patient Reason for Call: Talk to Nurse Summary of Call: pt needs letter to return to work full time 08-20-10, working 1/2 days last 2 wks and this week, needs faxed to linda buczk at [phone] would like a call when sent [phone] Initial call taken by: [name],  August 13, 2010 12:00 PM  Follow-up for Phone Call        [facility] [name] Red RN  Pt is going back to work full time on October 10,11.  He needs a note this week faxed to [phone].  Forwarded to Dr. Eden Emms and Stanton Kidney. [name] Red RN     Appended Document: needs rtn to work note **LMTCB** OK to give note for full return to work 10/10  Appended Document: needs rtn to work note **LMTCB** NOTE DONE LM TO LET PT KNOW FAXED TO NAMED INDIVIDUAL IN MESSAGE./CY  Appended Document: needs rtn to work note **LMTCB** PT AWARE./CY

## 2010-12-11 NOTE — Progress Notes (Signed)
Summary: disability claim-certificate date   Phone Note From Other Clinic   Caller: linda from [employer] short term disability [phone]  Request: Talk with Nurse Details of Complaint: discuss disability claim certification date.  Initial call taken by: Lorne Skeens,  July 23, 2010 9:43 AM  Follow-up for Phone Call        spoke with linda, questions answered Deliah Goody, RN  July 23, 2010 11:48 AM

## 2010-12-11 NOTE — Medication Information (Signed)
Summary: rov/nb   Anticoagulant Therapy  Managed by: Reina Fuse, PharmD Referring MD: Dr. Charlton Haws PCP: Birdie Sons, MD  Supervising MD: Excell Seltzer MD, Casimiro Needle Indication 1: V58.83,V58.61,415.19 Indication 2: DVT INR POC 1.5 INR RANGE 2.0-3.0  Dietary changes: no    Health status changes: no    Bleeding/hemorrhagic complications: no    Recent/future hospitalizations: no    Any changes in medication regimen? no    Recent/future dental: no  Any missed doses?: no       Is patient compliant with meds? yes      Comments: Pt reports compliance and no changes. He complains of dizziness at today's visit which is not new for him and Dr. Eden Emms is aware. He denies any shortness of breath.  He has been subtherapeutic at last 3 visits (PE in September), but Lovenox was stopped at previous visit due to INR 1.9. INR subtherapeutic today at 1.5. Will boost and check back Monday afternoon, but pt will likely need an increased dose of Coumadin.   Allergies: 1)  ! Morphine Sulfate Cr (Morphine Sulfate) 2)  ! Oxycontin (Oxycodone Hcl) 3)  ! Aspirin 4)  ! Lortab 5)  ! Micardis Hct (Telmisartan-Hctz) 6)  Lipitor (Atorvastatin Calcium) 7)  Altace (Ramipril) 8)  Codeine Phosphate (Codeine Phosphate) 9)  Buspirone Hcl (Buspirone Hcl)  Anticoagulation Management History:      The patient is taking warfarin and comes in today for a routine follow up visit.  Negative risk factors for bleeding include an age less than 59 years old.  The bleeding index is 'low risk'.  Positive CHADS2 values include History of HTN.  Negative CHADS2 values include Age > 66 years old.  His last INR was 1.6.  Anticoagulation responsible provider: Excell Seltzer MD, Casimiro Needle.  INR POC: 1.5.  Cuvette Lot#: [id number].  Exp: 09/2011.    Anticoagulation Management Assessment/Plan:      The patient's current anticoagulation dose is Coumadin 5 mg tabs: take one and half tab by mouth once daily.  The target INR is 2.0-3.0.  The next INR  is due 10/08/2010.  Results were reviewed/authorized by Reina Fuse, PharmD.  He was notified by Reina Fuse PharmD.         Prior Anticoagulation Instructions: INR 1.9 Take 3 tablets today then take 2 tablets everyday except 3 tablets on Wednesday. Recheck INR in 1 week.  Current Anticoagulation Instructions: INR 1.5  Take Coumadin 3 tabs (15 mg) today,  tomorrow, and Wednesday (November 21st, 22nd, 23).  Then take Coumadin 2 tabs (10 mg) daily until return to clinic on Monday afternoon.

## 2010-12-11 NOTE — Progress Notes (Signed)
Summary: pt has questions [facility])   Phone Note Call from Patient   Caller: Patient [phone] Reason for Call: Talk to Nurse Summary of Call: pt calling re mix up of appts/has questions-how will we determine how long he will need blood thinner? Initial call taken by: [name],  September 18, 2010 1:59 PM  Follow-up for Phone Call        left message to call back Dossie Arbour, RN, BSN  September 18, 2010 2:15 PM  Requested prelim venous duplex from [facility]. The pt is scheduled for a chest CT tomorrow at 2:30pm per Rose in [place]. She states she has attempted to call the pt with this appt and to tell him he should be NPO 2 hours prior to appt. We will await a call back from the pt. Sherri Rad, RN, BSN  September 18, 2010 4:09 PM    Additional Follow-up for Phone Call Additional follow up Details #1::        Pt returning call.sign  Pt calling to talk about test results call back # [phone] Judie Grieve  September 19, 2010 9:41 AM    Additional Follow-up for Phone Call Additional follow up Details #2::    SPOKE WITH PT DOESNT THINK HE WILL BE ABLE TO TAKE COUMADIN FOR THE FULL  6  MONTHS AS REQUESTED PT ALSO C/O U/S TECH NOT A PLEASANT ENCOUNTER. Follow-up by: Scherrie Bateman, LPN,  September 19, 2010 11:47 AM  Additional Follow-up for Phone Call Additional follow up Details #3:: Details for Additional Follow-up Action Taken: He still has a lot of clot in his right leg on duplex today.  Will likely be on coumadin additonal 6 months Additional Follow-up by: Colon Branch, MD, [facility],  September 19, 2010 12:30 PM   Appended Document: pt has questions [facility]) LMTCB./CY

## 2010-12-11 NOTE — Letter (Signed)
Summary: Return To Work  [employer], Main Office  [address]   [place], [place] [address]   Phone: [phone]  Fax: [phone]    07/10/2010  TO: WHOM IT MAY CONCERN   RE: BRENNON OTTERNESS [address] [place]   The above named individual is under my medical care and may return to work on:07-11-10 HALF DAYS ONLY UNTIL 07-23-10 AT WHICH TIME HE CAN RETURN FULL TIME, WITH NO RESTRICTIONS.  If you have any further questions or need additional information, please call.     Sincerely,   Dr Charlton Haws Deliah Goody, RN

## 2010-12-11 NOTE — Assessment & Plan Note (Signed)
Summary: 1 month rov/njr   Vital Signs:  Patient profile:   65 year old male Weight:      205 pounds Temp:     98.5 degrees F oral Pulse rate:   72 / minute Pulse rhythm:   regular BP sitting:   112 / 84  (left arm) Cuff size:   large  Vitals Entered By: Alfred Levins, CMA (October 08, 2010 11:19 AM) CC: renew meds, h/a's   Primary Care Provider:  Birdie Sons, MD   CC:  renew meds and h/a's.  History of Present Illness: new headaches---1-2 months headaches can be severe---some tenderness to palpation--mostly right sided patient denies any fevers or chills.  Patient on chronic anticoagulation followed in Coumadin clinic. Note recent Lovenox use.  Patient denies any neurologic deficits. He denies any visual changes. He denies any over-the-counter medications. No other specific complaints in a complete review of systems. He does admit to chronic anxiety.  Current Medications (verified): 1)  Lorazepam 0.5 Mg Tabs (Lorazepam) .... Take 1/2-1 Tablet By Mouth Once A Day As Needed 2)  Aspirin 81 Mg  Tabs (Aspirin) .... Daily 3)  Aciphex 20 Mg Tbec (Rabeprazole Sodium) .... Take One Tablet By Mouth Every Other Day For 14 Days and Then 1 Every 3rd Day For 14 Days and Then Stop 4)  Metoprolol Succinate 25 Mg Xr24h-Tab (Metoprolol Succinate) .... Take 1/2  Tablet By Mouth Once A Day 5)  Coumadin 5 Mg Tabs (Warfarin Sodium) .... Take One and Half Tab By Mouth Once Daily  Allergies (verified): 1)  ! Morphine Sulfate Cr (Morphine Sulfate) 2)  ! Oxycontin (Oxycodone Hcl) 3)  ! Aspirin 4)  ! Lortab 5)  ! Micardis Hct (Telmisartan-Hctz) 6)  Lipitor (Atorvastatin Calcium) 7)  Altace (Ramipril) 8)  Codeine Phosphate (Codeine Phosphate) 9)  Buspirone Hcl (Buspirone Hcl)  Physical Exam  General:  pleasant, well-developed male in no acute distress. HEENT exam atraumatic, normocephalic symmetric her muscles are intact. Neck is supple. Temporal arteries are easily palpated and feel normal.  No tenderness to palpation. Chest clear to auscultation. Cardiac exam S1-S2 are regular. Abdominal exam active bowel sounds, soft. Extremities no clubbing cyanosis or edema.   Impression & Recommendations:  Problem # 1:  MIGRAINE (ICD-346.90)  ? dx new onset given new onset--needs imaging also R/O TA---check ESR His updated medication list for this problem includes:    Aspirin 81 Mg Tabs (Aspirin) .Marland Kitchen... Daily    Metoprolol Succinate 25 Mg Xr24h-tab (Metoprolol succinate) .Marland Kitchen... Take 1/2  tablet by mouth once a day    Ultram 50 Mg Tabs (Tramadol hcl) ..... One by mouth three times a day as needed headache  Orders: Venipuncture ([address]) Specimen Handling (60454) Radiology Referral (Radiology) TLB-Sedimentation Rate (ESR) (85652-ESR)  Complete Medication List: 1)  Lorazepam 0.5 Mg Tabs (Lorazepam) .... Take 1/2-1 tablet by mouth once a day as needed 2)  Aspirin 81 Mg Tabs (Aspirin) .... Daily 3)  Aciphex 20 Mg Tbec (Rabeprazole sodium) .... Take one tablet by mouth every other day for 14 days and then 1 every 3rd day for 14 days and then stop 4)  Metoprolol Succinate 25 Mg Xr24h-tab (Metoprolol succinate) .... Take 1/2  tablet by mouth once a day 5)  Warfarin Sodium 5 Mg Tabs (Warfarin sodium) .... Use as directed by anticoagulation clinic 6)  Ultram 50 Mg Tabs (Tramadol hcl) .... One by mouth three times a day as needed headache   Orders Added: 1)  Est. Patient Level IV [09811] 2)  Venipuncture [id number] 3)  Specimen Handling [99000] 4)  Radiology Referral [Radiology] 5)  TLB-Sedimentation Rate (ESR) [85652-ESR]

## 2010-12-11 NOTE — Consult Note (Signed)
Summary: [facility]  [facility]   Imported By: Maryln Gottron 05/17/2010 12:50:25  _____________________________________________________________________  External Attachment:    Type:   Image     Comment:   External Document

## 2010-12-11 NOTE — Letter (Signed)
Summary: Return To Work  [employer], Main Office  [address]   [place], [place] [address]   Phone: [phone]  Fax: [phone]    08/15/2010  TO: Leodis Sias IT MAY CONCERN   RE: [name] [address] [place]   The above named individual is under my medical care and may return to work on:08/20/10 WITHOUT RESTRICTIONS.  If you have any further questions or need additional information, please call.     Sincerely,   DR Dilcia Rybarczyk/ Scherrie Bateman, LPN

## 2010-12-11 NOTE — Assessment & Plan Note (Signed)
Summary: fu on med/njr   Vital Signs:  Patient profile:   65 year old male Weight:      197 pounds Temp:     98.3 degrees F oral BP sitting:   110 / 80  (left arm) Cuff size:   regular  Vitals Entered By: Kern Reap CMA Duncan Dull) (July 27, 2010 8:20 AM)  Contraindications/Deferment of Procedures/Staging:    Test/Procedure: FLU VAX    Reason for deferment: patient declined  CC: follow-up visit Is Patient Diabetic? No Pain Assessment Patient in pain? no        Primary Care Provider:  Birdie Sons, MD   CC:  follow-up visit.  History of Present Illness:  Follow-Up Visit      This is a 65 year old man who presents for Follow-up visit.  The patient denies chest pain and palpitations.  Since the last visit the patient notes a recent hospitilization and being seen by a specialist.  The patient reports taking meds as prescribed.  When questioned about possible medication side effects, the patient notes none.   reviewed hospital records and ED records.   All other systems reviewed and were negative   Current Medications (verified): 1)  Lorazepam 0.5 Mg Tabs (Lorazepam) .... Take 1/2-1 Tablet By Mouth Once A Day As Needed 2)  Citalopram Hydrobromide 40 Mg  Tabs (Citalopram Hydrobromide) .... One By Mouth Daily 3)  Trazodone Hcl 50 Mg Tabs (Trazodone Hcl) .... Take 1/2-1 By Mouth At Bedtime As Needed 4)  Aspirin 81 Mg  Tabs (Aspirin) .... Daily 5)  Aciphex 20 Mg Tbec (Rabeprazole Sodium) .... One By Mouth Q Day 6)  Klor-Con M20 20 Meq Cr-Tabs (Potassium Chloride Crys Cr) .... Take 1 Tablet By Mouth Once A Day 7)  Metoprolol Succinate 25 Mg Xr24h-Tab (Metoprolol Succinate) .... Take 1 Tablet By Mouth Once A Day 8)  Furosemide 20 Mg Tabs (Furosemide) .... Take 1 Tablet By Mouth Once A Day 9)  Coumadin 5 Mg Tabs (Warfarin Sodium) .... Take One and Half Tab By Mouth Once Daily  Allergies: 1)  ! Morphine Sulfate Cr (Morphine Sulfate) 2)  ! Oxycontin (Oxycodone Hcl) 3)  !  Aspirin 4)  ! Lortab 5)  ! Micardis Hct (Telmisartan-Hctz) 6)  Lipitor (Atorvastatin Calcium) 7)  Altace (Ramipril) 8)  Codeine Phosphate (Codeine Phosphate)  Physical Exam  General:  alert and well-developed.   Head:  normocephalic and atraumatic.   Eyes:  pupils equal and pupils round.   Ears:  R ear normal and L ear normal.   Neck:  No deformities, masses, or tenderness noted. Lungs:  normal respiratory effort and no dullness.   Heart:  normal rate and regular rhythm.   Abdomen:  soft and non-tender.   Msk:  No deformity or scoliosis noted of thoracic or lumbar spine.   Skin:  turgor normal and color normal.     Impression & Recommendations:  Problem # 1:  PULMONARY EMBOLISM (ICD-415.19) tolerating meds  His updated medication list for this problem includes:    Aspirin 81 Mg Tabs (Aspirin) .Marland Kitchen... Daily    Coumadin 5 Mg Tabs (Warfarin sodium) .Marland Kitchen... Take one and half tab by mouth once daily  Problem # 2:  URI (ICD-465.9) some sinus congestion His updated medication list for this problem includes:    Aspirin 81 Mg Tabs (Aspirin) .Marland Kitchen... Daily  Problem # 3:  GERD (ICD-530.81) controlled continue current medications  His updated medication list for this problem includes:    Aciphex 20  Mg Tbec (Rabeprazole sodium) ..... One by mouth q day  Problem # 4:  PROSTATE CANCER, HX OF (ICD-V10.46) no known recurrence--- doubt related to PE  Complete Medication List: 1)  Lorazepam 0.5 Mg Tabs (Lorazepam) .... Take 1/2-1 tablet by mouth once a day as needed 2)  Citalopram Hydrobromide 40 Mg Tabs (Citalopram hydrobromide) .... One by mouth daily 3)  Trazodone Hcl 50 Mg Tabs (Trazodone hcl) .... Take 1/2-1 by mouth at bedtime as needed 4)  Aspirin 81 Mg Tabs (Aspirin) .... Daily 5)  Aciphex 20 Mg Tbec (Rabeprazole sodium) .... One by mouth q day 6)  Klor-con M20 20 Meq Cr-tabs (Potassium chloride crys cr) .... Take 1 tablet by mouth once a day 7)  Metoprolol Succinate 25 Mg Xr24h-tab  (Metoprolol succinate) .... Take 1 tablet by mouth once a day 8)  Furosemide 20 Mg Tabs (Furosemide) .... Take 1 tablet by mouth once a day 9)  Coumadin 5 Mg Tabs (Warfarin sodium) .... Take one and half tab by mouth once daily 10)  Fluticasone Propionate 50 Mcg/act Susp (Fluticasone propionate) .... 2 sprays each nostril once daily  Patient Instructions: 1)  see me 3-4 weeks, CANCEL all other existing appts with me Prescriptions: FLUTICASONE PROPIONATE 50 MCG/ACT  SUSP (FLUTICASONE PROPIONATE) 2 sprays each nostril once daily  #1 vial x 3   Entered and Authorized by:   [name]   Signed by:   [name] on 07/27/2010   Method used:   [facility]        [facility]  [phone]* (retail)       [address] [place], [place]  [address]       Ph: [phone] or [phone]       Fax: [phone]   RxID:   [id number]

## 2010-12-11 NOTE — Progress Notes (Signed)
Summary: Pt req to get coumedin lvl monitored and checked here`  Phone Note Call from Patient Call back at Home Phone [phone]   Caller: Patient Summary of Call: Pt called and wants to know if he can start getting his coumedin lvls monitored and checked here at [facility] at Dr Alyse Low office. Pls advise..       Initial call taken by: Lucy Antigua,  July 31, 2010 2:13 PM  Follow-up for Phone Call        yes  Additional Follow-up for Phone Call Additional follow up Details #1::        patient is aware Additional Follow-up by: Kern Reap CMA Duncan Dull),  July 31, 2010 5:16 PM

## 2010-12-11 NOTE — Letter (Signed)
Summary: Patient [name] Biopsy Results  [facility]  [address] [place], [place] [address]   Phone: [phone]  Fax: [phone]        Apr 04, 2010 MRN: [id number]    [facility] [address] [place], [place]  [address]    Dear [name],  I am pleased to inform you that the biopsies taken during your recent endoscopic examination did not show any evidence of cancer upon pathologic examination.Negative h.pylori exam on biopsy.  Additional information/recommendations:  __No further action is needed at this time.  Please follow-up with      your primary care physician for your other healthcare needs.  __ Please call [phone] to schedule a return visit to review      your condition.  x__ Continue with the treatment plan as outlined on the day of your      exam.  __ You should have a repeat endoscopic examination for this problem              in _ months/years.   Please call us if you are having persistent problems or have questions about your condition that have not been fully answered at this time.  Sincerely,  Mardella Layman MD [facility]  This letter has been electronically signed by your physician.  Appended Document: Patient Notice-Endo Biopsy Results letter mailed.

## 2010-12-11 NOTE — Assessment & Plan Note (Signed)
Summary: MED CHECK/REFILLS/PT COMING IN FASTING/CJR   Vital Signs:  Patient profile:   65 year old male Weight:      195 pounds Temp:     98.6 degrees F oral Pulse rate:   66 / minute Pulse rhythm:   regular Resp:     12 per minute BP sitting:   110 / 82  (left arm) Cuff size:   regular  Vitals Entered By: Gladis Riffle, RN (June 25, 2010 8:45 AM) CC: discharged from [facility] and now wants med check Is Patient Diabetic? No   Primary Care Provider:  Birdie Sons, MD   CC:  discharged from [facility] and now wants med check.  History of Present Illness:  Follow-Up Visit, patient has recently undergone minimally invasive aortic valve replacement at [facility].      This is a 65 year old man who presents for Follow-up visit.  The patient complains of chest pain related to surgery, but denies palpitations.  Since the last visit the patient notes a recent hospitilization and being seen by a specialist.  The patient reports taking meds as prescribed.  When questioned about possible medication side effects, the patient notes none.    All other systems reviewed and were negative   Preventive Screening-Counseling & Management  Alcohol-Tobacco     Smoking Status: quit     Year Quit: 1980  Current Problems (verified): 1)  Preventive Health Care  (ICD-V70.0) 2)  Gerd  (ICD-530.81) 3)  Atrial Premature Beats  (ICD-427.61) 4)  Hypertension  (ICD-401.9) 5)  Hyperlipidemia  (ICD-272.4) 6)  Atrial Fibrillation  (ICD-427.31) 7)  Mitral Regurgitation  (ICD-396.3) 8)  Hypercholesterolemia  (ICD-272.0) 9)  Insomnia-sleep Disorder-unspec  (ICD-780.52) 10)  Anxiety  (ICD-300.00) 11)  Aortic Regurgitation, Moderate  (ICD-424.1) 12)  Prostate Cancer, Hx of  (ICD-V10.46)  Current Medications (verified): 1)  Lorazepam 0.5 Mg Tabs (Lorazepam) .... Take 1/2-1 Tablet By Mouth Once A Day As Needed 2)  Citalopram Hydrobromide 40 Mg  Tabs (Citalopram Hydrobromide) .... One By Mouth Daily 3)  Trazodone  Hcl 50 Mg Tabs (Trazodone Hcl) .... Take 1/2-1 By Mouth At Bedtime As Needed 4)  Aspirin 81 Mg  Tabs (Aspirin) .... Daily 5)  Omnaris 50 Mcg/act Susp (Ciclesonide) .... As Needed 6)  Aciphex 20 Mg Tbec (Rabeprazole Sodium) .... One By Mouth Q Day 7)  Klor-Con M20 20 Meq Cr-Tabs (Potassium Chloride Crys Cr) .... Take 1 Tablet By Mouth Once A Day 8)  Amiodarone Hcl 200 Mg Tabs (Amiodarone Hcl) .... Take 1 Tablet By Mouth Once A Day 9)  Metoprolol Succinate 25 Mg Xr24h-Tab (Metoprolol Succinate) .... Take 1 Tablet By Mouth Once A Day 10)  Furosemide 20 Mg Tabs (Furosemide) .... Take 1 Tablet By Mouth Once A Day 11)  Zydone 10-400 Mg Tabs (Hydrocodone-Acetaminophen) .... 1/2 Every Six Hours As Needed Pain  Allergies: 1)  ! Morphine Sulfate Cr (Morphine Sulfate) 2)  ! Oxycontin (Oxycodone Hcl) 3)  ! Aspirin 4)  ! Lortab 5)  ! Micardis Hct (Telmisartan-Hctz) 6)  Lipitor (Atorvastatin Calcium) 7)  Altace (Ramipril) 8)  Codeine Phosphate (Codeine Phosphate)  Past History:  Past Surgical History: angiogram-"no blockage", pt's report  Erectile dysfunction   penile implant   Prostate surgery  prostatectomy Cataract extraction--left 12/06/09; right 09/14/09 AVR---[facility] 2011  Physical Exam  General:  alert and well-developed.   Head:  normocephalic and atraumatic.   Eyes:  pupils equal and pupils round.   Ears:  R ear normal and L ear  normal.   Neck:  No deformities, masses, or tenderness noted. Lungs:  Normal respiratory effort, chest expands symmetrically. Lungs are clear to auscultation, no crackles or wheezes. Heart:  normal rate and regular rhythm.   no significant murmurs Abdomen:  soft and non-tender.   Msk:  No deformity or scoliosis noted of thoracic or lumbar spine.   Neurologic:  cranial nerves II-XII intact and gait normal.   Skin:  turgor normal and color normal.   Psych:  good eye contact and not anxious appearing.     Impression & Recommendations:  Problem # 1:   AORTIC REGURGITATION, MODERATE (ICD-424.1)  s/p surgery doing well  His updated medication list for this problem includes:    Aspirin 81 Mg Tabs (Aspirin) .Marland Kitchen... Daily    Amiodarone Hcl 200 Mg Tabs (Amiodarone hcl) .Marland Kitchen... Take 1 tablet by mouth once a day    Metoprolol Succinate 25 Mg Xr24h-tab (Metoprolol succinate) .Marland Kitchen... Take 1 tablet by mouth once a day  Echocardiogram:  SUMMARY   -  The left ventricle was mildly to moderately dilated. Overall left         ventricular systolic function was normal. Left ventricular         ejection fraction was estimated to be 55 %. There were no         left ventricular regional wall motion abnormalities. Left         ventricular wall thickness was mildly increased.   -  Reversal of flow noted in the descending aorta. There was         moderate to severe aortic valvular regurgitation. The aortic         regurgitation jet was eccentric.   -  There was mild mitral valvular regurgitation.   -  The left atrium was mildly dilated.    IMPRESSIONS   -  AI jet is eccentric and difficult to quantitate; at least         moderate; probable partially flail right aortic cusp.     ---------------------------------------------------------------    Prepared and Electronically Authenticated by    Olga Millers M.D.   Confirmed 22-Sep-2008 11:47:56  (09/22/2008)  Problem # 2:  HYPERLIPIDEMIA (ICD-272.4) currently no medications. We'll reevaluate next visit. Labs Reviewed: SGOT: 19 (03/20/2010)   SGPT: 22 (03/20/2010)   HDL:43.50 (03/20/2010), 37.9 (12/19/2008)  LDL:DEL (12/19/2008), 130 (10/20/2007)  Chol:223 (03/20/2010), 239 (12/19/2008)  Trig:100.0 (03/20/2010), 139 (12/19/2008)  Problem # 3:  HYPERTENSION (ICD-401.9) controlled he was placed on furosemide post op---i doubt he will need it long term His updated medication list for this problem includes:    Metoprolol Succinate 25 Mg Xr24h-tab (Metoprolol succinate) .Marland Kitchen... Take 1 tablet by mouth once a  day    Furosemide 20 Mg Tabs (Furosemide) .Marland Kitchen... Take 1 tablet by mouth once a day  BP today: 110/82 Prior BP: 125/57 (04/11/2010)  Labs Reviewed: K+: 5.0 (03/20/2010) Creat: : 1.2 (03/20/2010)   Chol: 223 (03/20/2010)   HDL: 43.50 (03/20/2010)   LDL: DEL (12/19/2008)   TG: 100.0 (03/20/2010)  Problem # 4:  ATRIAL FIBRILLATION (ICD-427.31) currently SR (by exam) scheduled to stop amiodarone at the end of this month.  His updated medication list for this problem includes:    Aspirin 81 Mg Tabs (Aspirin) .Marland Kitchen... Daily    Amiodarone Hcl 200 Mg Tabs (Amiodarone hcl) .Marland Kitchen... Take 1 tablet by mouth once a day    Metoprolol Succinate 25 Mg Xr24h-tab (Metoprolol succinate) .Marland Kitchen... Take 1 tablet by mouth once a day  Complete Medication List: 1)  Lorazepam 0.5 Mg Tabs (Lorazepam) .... Take 1/2-1 tablet by mouth once a day as needed 2)  Citalopram Hydrobromide 40 Mg Tabs (Citalopram hydrobromide) .... One by mouth daily 3)  Trazodone Hcl 50 Mg Tabs (Trazodone hcl) .... Take 1/2-1 by mouth at bedtime as needed 4)  Aspirin 81 Mg Tabs (Aspirin) .... Daily 5)  Omnaris 50 Mcg/act Susp (Ciclesonide) .... As needed 6)  Aciphex 20 Mg Tbec (Rabeprazole sodium) .... One by mouth q day 7)  Klor-con M20 20 Meq Cr-tabs (Potassium chloride crys cr) .... Take 1 tablet by mouth once a day 8)  Amiodarone Hcl 200 Mg Tabs (Amiodarone hcl) .... Take 1 tablet by mouth once a day 9)  Metoprolol Succinate 25 Mg Xr24h-tab (Metoprolol succinate) .... Take 1 tablet by mouth once a day 10)  Furosemide 20 Mg Tabs (Furosemide) .... Take 1 tablet by mouth once a day 11)  Zydone 10-400 Mg Tabs (Hydrocodone-acetaminophen) .... 1/2 every six hours as needed pain  Patient Instructions: 1)  see me 6 weeks

## 2010-12-11 NOTE — Progress Notes (Signed)
Summary: Aciphex worked better and want to go back  Phone Note Call from Patient   Caller: Patient Call For: [name] Summary of Call: Wants to switch back to Aciphex, please. [facility] Initial call taken by: Lynann Beaver CMA,  May 01, 2010 10:53 AM  Follow-up for Phone Call        ok Follow-up by: [name],  May 01, 2010 12:09 PM    New/Updated Medications: ACIPHEX 20 MG TBEC (RABEPRAZOLE SODIUM) one by mouth q day Prescriptions: ACIPHEX 20 MG TBEC (RABEPRAZOLE SODIUM) one by mouth q day  #90 x 3   Entered by:   Lynann Beaver CMA   Authorized by:   [name]   Signed by:   Lynann Beaver CMA on 05/01/2010   Method used:   [facility] to        [facility]  [phone]* (retail)       [address] [place], [place]  [address]       Ph: [phone] or [phone]       Fax: [phone]   RxID:   [id number]  Pt. notified.

## 2010-12-11 NOTE — Progress Notes (Signed)
Summary: pt returned call  Medications Added METOPROLOL SUCCINATE 25 MG XR24H-TAB (METOPROLOL SUCCINATE) Take 1/2  tablet by mouth once a day       Phone Note Call from Patient   Caller: Patient Summary of Call: pt returned call -pls call [phone] ok to leave msg Initial call taken by: [name],  September 20, 2010 1:34 PM  Follow-up for Phone Call        left message to call back. Dossie Arbour, RN, BSN  September 20, 2010 2:31 PM   Additional Follow-up for Phone Call Additional follow up Details #1::        pt returning your call please call him at #[phone]  pt returning [phone] [name]  September 21, 2010 10:57 AM  pt returning call.sign Additional Follow-up by: Roe Coombs,  September 20, 2010 2:36 PM    Additional Follow-up for Phone Call Additional follow up Details #2::    Spoke with pt who reports he had spoken with Dr. Eden Emms about metoprolol possibly being the cause of his dizziness and a possible med change. He is calling today to see if Dr. Eden Emms wants to change this.  He states dizziness is OK today.  Will forward note to Dr. Eden Emms to review. I told pt we would call him back tomorrow with Dr. Fabio Bering instructions Dossie Arbour, RN, BSN  September 20, 2010 2:49 PM  left message for pt to take 1/2 tablet of metoprolol once daily to see if that improves his symptoms. he will call back if cont to have problems Deliah Goody, RN  September 24, 2010 1:21 PM   New/Updated Medications: METOPROLOL SUCCINATE 25 MG XR24H-TAB (METOPROLOL SUCCINATE) Take 1/2  tablet by mouth once a day

## 2010-12-11 NOTE — Medication Information (Signed)
Summary: coumadin f/u sl  Medications Added LOVENOX 100 MG/ML SOLN (ENOXAPARIN SODIUM) Inject 90mg  subcutaneously into abdomen every 12 hr       Anticoagulant Therapy  Managed by: Leota Sauers, PharmD, BCPS, CPP Referring MD: Dr. Charlton Haws PCP: Birdie Sons, MD  Supervising MD: Eden Emms MD, Theron Arista Indication 1: V58.83,V58.61,415.19 Indication 2: DVT INR POC 1.7 INR RANGE 2.0-3.0  Dietary changes: yes       Details: instructed to be consistant and resume usual diet  Health status changes: no    Bleeding/hemorrhagic complications: no    Recent/future hospitalizations: no    Any changes in medication regimen? no    Recent/future dental: no  Any missed doses?: no       Is patient compliant with meds? yes       Current Medications (verified): 1)  Lorazepam 0.5 Mg Tabs (Lorazepam) .... Take 1/2-1 Tablet By Mouth Once A Day As Needed 2)  Aspirin 81 Mg  Tabs (Aspirin) .... Daily 3)  Aciphex 20 Mg Tbec (Rabeprazole Sodium) .... Take One Tablet By Mouth Every Other Day For 14 Days and Then 1 Every 3rd Day For 14 Days and Then Stop 4)  Metoprolol Succinate 25 Mg Xr24h-Tab (Metoprolol Succinate) .... Take 1 Tablet By Mouth Once A Day 5)  Coumadin 5 Mg Tabs (Warfarin Sodium) .... Take One and Half Tab By Mouth Once Daily 6)  Fluticasone Propionate 50 Mcg/act  Susp (Fluticasone Propionate) .... 2 Sprays Each Nostril Once Daily 7)  Lovenox 100 Mg/ml Soln (Enoxaparin Sodium) .... Inject 90mg  Subcutaneously Into Abdomen Every 12 Hr  Allergies: 1)  ! Morphine Sulfate Cr (Morphine Sulfate) 2)  ! Oxycontin (Oxycodone Hcl) 3)  ! Aspirin 4)  ! Lortab 5)  ! Micardis Hct (Telmisartan-Hctz) 6)  Lipitor (Atorvastatin Calcium) 7)  Altace (Ramipril) 8)  Codeine Phosphate (Codeine Phosphate) 9)  Buspirone Hcl (Buspirone Hcl)  Anticoagulation Management History:      Negative risk factors for bleeding include an age less than 72 years old.  The bleeding index is 'low risk'.  Positive CHADS2  values include History of HTN.  Negative CHADS2 values include Age > 44 years old.  His last INR was 1.6.  Anticoagulation responsible Emrey Thornley: Eden Emms MD, Theron Arista.  INR POC: 1.7.    Anticoagulation Management Assessment/Plan:      The patient's current anticoagulation dose is Coumadin 5 mg tabs: take one and half tab by mouth once daily.  The next INR is due 09/24/2010.  Results were reviewed/authorized by Leota Sauers, PharmD, BCPS, CPP.         Current Anticoagulation Instructions: INR 1.7  Coumadin 5mg  tabs, take 2 tabs each day as well as Lovenox 90mg  inject subcutaneously every 12hr for the next 5 days  Appended Document: coumadin f/u sl Started on Lovenox and coumadin to be more aggressively dosed in our clinic on [address]

## 2010-12-11 NOTE — Assessment & Plan Note (Signed)
Summary: EPH./ GD      Allergies Added:   Referring Provider:  Birdie Sons, MD  Primary Provider:  Birdie Sons, MD    History of Present Illness: [name] is seen post hospital D/C for large bilateral PE.  this was in the setting of recent airline flight to [facility] and more distaant AVR.  He had no significant hypokemia or hemodynic compromise and RV was normal on echo.  His INR is being followed at Dr Cato Mulligan office and has been Rx although one reading was 2.0.  He continues to have atypical very fleeting twinges of pain in the front and back of his chest.  No palpitations, syncope or edema.  [facility] [name] confirmed right DVT.  I discussed F/U chest CT and venous duplex in 6 weeks to monitor the clot burden and doucument effectiveness of coumadin.   Current Problems (verified): 1)  Uri  (ICD-465.9) 2)  Dvt  (ICD-453.40) 3)  Pulmonary Embolism  (ICD-415.19) 4)  Preventive Health Care  (ICD-V70.0) 5)  Gerd  (ICD-530.81) 6)  Atrial Premature Beats  (ICD-427.61) 7)  Hypertension  (ICD-401.9) 8)  Hyperlipidemia  (ICD-272.4) 9)  Atrial Fibrillation  (ICD-427.31) 10)  Mitral Regurgitation  (ICD-396.3) 11)  Hypercholesterolemia  (ICD-272.0) 12)  Insomnia-sleep Disorder-unspec  (ICD-780.52) 13)  Anxiety  (ICD-300.00) 14)  Aortic Regurgitation, Moderate  (ICD-424.1) 15)  Prostate Cancer, Hx of  (ICD-V10.46)  Current Medications (verified): 1)  Lorazepam 0.5 Mg Tabs (Lorazepam) .... Take 1/2-1 Tablet By Mouth Once A Day As Needed 2)  Citalopram Hydrobromide 40 Mg  Tabs (Citalopram Hydrobromide) .... One By Mouth Daily 3)  Trazodone Hcl 50 Mg Tabs (Trazodone Hcl) .... Take 1/2-1 By Mouth At Bedtime As Needed 4)  Aspirin 81 Mg  Tabs (Aspirin) .... Daily 5)  Aciphex 20 Mg Tbec (Rabeprazole Sodium) .... One By Mouth Q Day 6)  Klor-Con M20 20 Meq Cr-Tabs (Potassium Chloride Crys Cr) .... Take 1 Tablet By Mouth Once A Day 7)  Metoprolol Succinate 25 Mg Xr24h-Tab (Metoprolol Succinate) .... Take 1  Tablet By Mouth Once A Day 8)  Furosemide 20 Mg Tabs (Furosemide) .... Take 1 Tablet By Mouth Once A Day 9)  Coumadin 5 Mg Tabs (Warfarin Sodium) .... Take One and Half Tab By Mouth Once Daily 10)  Fluticasone Propionate 50 Mcg/act  Susp (Fluticasone Propionate) .... 2 Sprays Each Nostril Once Daily  Allergies (verified): 1)  ! Morphine Sulfate Cr (Morphine Sulfate) 2)  ! Oxycontin (Oxycodone Hcl) 3)  ! Aspirin 4)  ! Lortab 5)  ! Micardis Hct (Telmisartan-Hctz) 6)  Lipitor (Atorvastatin Calcium) 7)  Altace (Ramipril) 8)  Codeine Phosphate (Codeine Phosphate)  Past History:  Past Medical History: Last updated: 03/27/2010 Current Problems:  HYPERTENSION (ICD-401.9) HYPERLIPIDEMIA (ICD-272.4) ATRIAL FIBRILLATION (ICD-427.31) MITRAL REGURGITATION (ICD-396.3) HYPERCHOLESTEROLEMIA (ICD-272.0) INSOMNIA-SLEEP DISORDER-UNSPEC (ICD-780.52) ANXIETY (ICD-300.00) AORTIC REGURGITATION, MODERATE (ICD-424.1) PROSTATE CANCER, HX OF (ICD-V10.46) ANTICOAGULATION THERAPY (ICD-V58.61)  Past Surgical History: Last updated: 06/25/2010 angiogram-"no blockage", pt's report  Erectile dysfunction   penile implant   Prostate surgery  prostatectomy Cataract extraction--left 12/06/09; right 09/14/09 AVR---[facility] 2011  Family History: Last updated: 01/09/2010 Family History High cholesterol Family History Hypertension Family History of Prostate CA 1st degree relative <50 sister---uterine Cancer No FH of Colon Cancer:  Social History: Last updated: 01/09/2010 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  Alcohol Use - no Daily Caffeine Use: 2-3 daily  Illicit Drug Use - no  Review of Systems       Denies fever,  malais, weight loss, blurry vision, decreased visual acuity, cough, sputum, SOB, hemoptysis, pleuritic pain, palpitaitons, heartburn, abdominal pain, melena, lower extremity edema, claudication, or rash.   Vital Signs:  Patient profile:   65 year old  male Height:      70 inches Weight:      200.8 pounds BMI:     28.92 Pulse rate:   72 / minute Resp:     12 per minute BP sitting:   118 / 77  (left arm)  Vitals Entered By: Kem Parkinson (August 08, 2010 3:24 PM)  Physical Exam  General:  Affect appropriate Healthy:  appears stated age HEENT: normal Neck supple with no adenopathy JVP normal no bruits no thyromegaly Lungs clear with no wheezing and good diaphragmatic motion Heart:  S1/S2 sytolic  murmur,rub, gallop or click PMI normal Abdomen: benighn, BS positve, no tenderness, no AAA no bruit.  No HSM or HJR Distal pulses intact with no bruits No edema Neuro non-focal Skin warm and dry    Impression & Recommendations:  Problem # 1:  PULMONARY EMBOLISM (ICD-415.19) F/U chest CT in 6 weeks.  Had almost complete occlusion of RMPA and large clot in left system as well.  Hopefully endogenous thrombolysis and coumadin will decrease the risk of chronic venovenous hypertension His updated medication list for this problem includes:    Aspirin 81 Mg Tabs (Aspirin) .Marland Kitchen... Daily    Coumadin 5 Mg Tabs (Warfarin sodium) .Marland Kitchen... Take one and half tab by mouth once daily  Problem # 2:  HYPERTENSION (ICD-401.9) Well controlled His updated medication list for this problem includes:    Aspirin 81 Mg Tabs (Aspirin) .Marland Kitchen... Daily    Metoprolol Succinate 25 Mg Xr24h-tab (Metoprolol succinate) .Marland Kitchen... Take 1 tablet by mouth once a day    Furosemide 20 Mg Tabs (Furosemide) .Marland Kitchen... Take 1 tablet by mouth once a day  Orders: TLB-BMP (Basic Metabolic Panel-BMET) (80048-METABOL)  Problem # 3:  HYPERLIPIDEMIA (ICD-272.4) Continue low cholesterol diet CHOL: 223 (03/20/2010)   LDL: DEL (12/19/2008)   HDL: 43.50 (03/20/2010)   TG: 100.0 (03/20/2010)  Problem # 4:  AORTIC REGURGITATION, MODERATE (ICD-424.1) S/P AVR with tissue valve at [facility] with [facility].  Normal functioning and EF by echo.  SBE prophylaxis His updated medication list for this  problem includes:    Metoprolol Succinate 25 Mg Xr24h-tab (Metoprolol succinate) .Marland Kitchen... Take 1 tablet by mouth once a day    Furosemide 20 Mg Tabs (Furosemide) .Marland Kitchen... Take 1 tablet by mouth once a day  Other Orders: CT Scan  (CT Scan) Venous Duplex Lower Extremity (Venous Duplex Lower)  Patient Instructions: 1)  Your physician recommends that you schedule a follow-up appointment in: 3 MONTHS WITH DR Eden Emms 2)  Your physician recommends that you continue on your current medications as directed. Please refer to the Current Medication list given to you today. 3)  Non-Cardiac CT scanning, (CAT scanning), is a noninvasive, special x-ray that produces cross-sectional images of the body using x-rays and a computer. CT scans help physicians diagnose and treat medical conditions. For some CT exams, a contrast material is used to enhance visibility in the area of the body being studied. CT scans provide greater clarity and reveal more details than regular x-ray exams. 4)  Your physician has requested that you have a lower or upper extremity venous duplex.  This test is an ultrasound of the veins in the legs or arms.  It looks at venous blood flow that carries blood from the heart to  the legs or arms.  Allow one hour for a Lower Venous exam.  Allow thirty minutes for an Upper Venous exam. There are no restrictions or special instructions. 5)  Your physician recommends that you return for lab work in: TODAY BMET 401.1   EKG Report  Procedure date:  08/08/2010  Findings:      NSR 72 RBBB LVH No change from prev

## 2010-12-11 NOTE — Progress Notes (Signed)
   Walk in Patient Form Recieved " pt dropping off [employer] papers for completion" sent to [facility]  June 27, 2010 4:51 PM

## 2010-12-11 NOTE — Assessment & Plan Note (Signed)
Summary: PT // RS   Nurse Visit   Allergies: 1)  ! Morphine Sulfate Cr (Morphine Sulfate) 2)  ! Oxycontin (Oxycodone Hcl) 3)  ! Aspirin 4)  ! Lortab 5)  ! Micardis Hct (Telmisartan-Hctz) 6)  Lipitor (Atorvastatin Calcium) 7)  Altace (Ramipril) 8)  Codeine Phosphate (Codeine Phosphate) Laboratory Results   Blood Tests      INR: 2.0   (Normal Range: 0.88-1.12   Therap INR: 2.0-3.5)    Orders Added: 1)  Est. Patient Level I [84132] 2)  Protime [44010UV]   ANTICOAGULATION RECORD  NEW REGIMEN & LAB RESULTS Current INR: 2.0 Regimen: same Coagulation Comments: Dr. Caryl Never approved Repeat testing in: 2 weeks  Anticoagulation Visit Questionnaire Coumadin dose missed/changed:  No Abnormal Bleeding Symptoms:  No  Any diet changes including alcohol intake, vegetables or greens since the last visit:  No Any illnesses or hospitalizations since the last visit:  No Any signs of clotting since the last visit (including chest discomfort, dizziness, shortness of breath, arm tingling, slurred speech, swelling or redness in leg):  No  MEDICATIONS LORAZEPAM 0.5 MG TABS (LORAZEPAM) Take 1/2-1 tablet by mouth once a day as needed CITALOPRAM HYDROBROMIDE 40 MG  TABS (CITALOPRAM HYDROBROMIDE) one by mouth daily TRAZODONE HCL 50 MG TABS (TRAZODONE HCL) Take 1/2-1 by mouth at bedtime as needed ASPIRIN 81 MG  TABS (ASPIRIN) daily ACIPHEX 20 MG TBEC (RABEPRAZOLE SODIUM) one by mouth q day KLOR-CON M20 20 MEQ CR-TABS (POTASSIUM CHLORIDE CRYS CR) Take 1 tablet by mouth once a day METOPROLOL SUCCINATE 25 MG XR24H-TAB (METOPROLOL SUCCINATE) Take 1 tablet by mouth once a day FUROSEMIDE 20 MG TABS (FUROSEMIDE) Take 1 tablet by mouth once a day COUMADIN 5 MG TABS (WARFARIN SODIUM) take one and half tab by mouth once daily FLUTICASONE PROPIONATE 50 MCG/ACT  SUSP (FLUTICASONE PROPIONATE) 2 sprays each nostril once daily

## 2010-12-11 NOTE — Assessment & Plan Note (Signed)
Summary: 3 WK ROV/NJR---add pt//ccm   Vital Signs:  Patient profile:   65 year old male Weight:      199 pounds Temp:     98.7 degrees F oral BP sitting:   110 / 82  (left arm) Cuff size:   regular  Vitals Entered By: Sid Falcon LPN (August 17, 2010 8:56 AM)  Primary Care Provider:  Birdie Sons, MD    History of Present Illness:  Follow-Up Visit      This is a 65 year old man who presents for Follow-up visit.  The patient denies chest pain and palpitations.  Since the last visit the patient notes no new problems or concerns.  The patient reports taking meds as prescribed.  When questioned about possible medication side effects, the patient notes none.  He is feeling much better---breathing back to normal.  he describes orthostatic sxs---can happen any time  All other systems reviewed and were negative   Current Problems (verified): 1)  Coumadin Therapy  (ICD-V58.61) 2)  Encounter For Therapeutic Drug Monitoring  (ICD-V58.83) 3)  Dvt  (ICD-453.40) 4)  Pulmonary Embolism  (ICD-415.19) 5)  Preventive Health Care  (ICD-V70.0) 6)  Gerd  (ICD-530.81) 7)  Atrial Premature Beats  (ICD-427.61) 8)  Hypertension  (ICD-401.9) 9)  Hyperlipidemia  (ICD-272.4) 10)  Atrial Fibrillation  (ICD-427.31) 11)  Mitral Regurgitation  (ICD-396.3) 12)  Hypercholesterolemia  (ICD-272.0) 13)  Insomnia-sleep Disorder-unspec  (ICD-780.52) 14)  Anxiety  (ICD-300.00) 15)  Aortic Regurgitation, Moderate  (ICD-424.1) 16)  Prostate Cancer, Hx of  (ICD-V10.46)  Current Medications (verified): 1)  Lorazepam 0.5 Mg Tabs (Lorazepam) .... Take 1/2-1 Tablet By Mouth Once A Day As Needed 2)  Citalopram Hydrobromide 40 Mg  Tabs (Citalopram Hydrobromide) .... One By Mouth Daily 3)  Trazodone Hcl 50 Mg Tabs (Trazodone Hcl) .... Take 1/2-1 By Mouth At Bedtime As Needed 4)  Aspirin 81 Mg  Tabs (Aspirin) .... Daily 5)  Aciphex 20 Mg Tbec (Rabeprazole Sodium) .... One By Mouth Q Day 6)  Klor-Con M20 20 Meq  Cr-Tabs (Potassium Chloride Crys Cr) .... Take 1 Tablet By Mouth Once A Day 7)  Metoprolol Succinate 25 Mg Xr24h-Tab (Metoprolol Succinate) .... Take 1 Tablet By Mouth Once A Day 8)  Furosemide 20 Mg Tabs (Furosemide) .... Take 1 Tablet By Mouth Once A Day 9)  Coumadin 5 Mg Tabs (Warfarin Sodium) .... Take One and Half Tab By Mouth Once Daily 10)  Fluticasone Propionate 50 Mcg/act  Susp (Fluticasone Propionate) .... 2 Sprays Each Nostril Once Daily  Allergies: 1)  ! Morphine Sulfate Cr (Morphine Sulfate) 2)  ! Oxycontin (Oxycodone Hcl) 3)  ! Aspirin 4)  ! Lortab 5)  ! Micardis Hct (Telmisartan-Hctz) 6)  Lipitor (Atorvastatin Calcium) 7)  Altace (Ramipril) 8)  Codeine Phosphate (Codeine Phosphate)  Past History:  Past Medical History: Last updated: 03/27/2010 Current Problems:  HYPERTENSION (ICD-401.9) HYPERLIPIDEMIA (ICD-272.4) ATRIAL FIBRILLATION (ICD-427.31) MITRAL REGURGITATION (ICD-396.3) HYPERCHOLESTEROLEMIA (ICD-272.0) INSOMNIA-SLEEP DISORDER-UNSPEC (ICD-780.52) ANXIETY (ICD-300.00) AORTIC REGURGITATION, MODERATE (ICD-424.1) PROSTATE CANCER, HX OF (ICD-V10.46) ANTICOAGULATION THERAPY (ICD-V58.61)  Past Surgical History: Last updated: 06/25/2010 angiogram-"no blockage", pt's report  Erectile dysfunction   penile implant   Prostate surgery  prostatectomy Cataract extraction--left 12/06/09; right 09/14/09 AVR---[facility] 2011  Family History: Last updated: 01/09/2010 Family History High cholesterol Family History Hypertension Family History of Prostate CA 1st degree relative <50 sister---uterine Cancer No FH of Colon Cancer:  Social History: Last updated: 01/09/2010 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  Alcohol Use - no Daily Caffeine Use: 2-3 daily  Illicit Drug Use - no  Risk Factors: Exercise: yes (06/23/2007)  Risk Factors: Smoking Status: quit (06/25/2010)  Physical Exam  General:  alert and well-developed.     Head:  normocephalic and atraumatic.   Eyes:  pupils equal and pupils round.   Ears:  R ear normal and L ear normal.   Nose:  no external deformity.   Neck:  No deformities, masses, or tenderness noted. Lungs:  normal respiratory effort and no intercostal retractions.   Heart:  normal rate and regular rhythm.   Abdomen:  soft and non-tender.   Skin:  turgor normal and color normal.   Psych:  good eye contact and not anxious appearing.     Impression & Recommendations:  Problem # 1:  ANXIETY (ICD-300.00)  probably not as well controlled as possible-- discussed The following medications were removed from the medication list:    Citalopram Hydrobromide 40 Mg Tabs (Citalopram hydrobromide) ..... One by mouth daily His updated medication list for this problem includes:    Lorazepam 0.5 Mg Tabs (Lorazepam) .Marland Kitchen... Take 1/2-1 tablet by mouth once a day as needed    Trazodone Hcl 50 Mg Tabs (Trazodone hcl) .Marland Kitchen... Take 1/2-1 by mouth at bedtime as needed    Buspirone Hcl 5 Mg Tabs (Buspirone hcl) .Marland Kitchen..Marland Kitchen Two times a day  Discussed medication use and relaxation techniques. --seeing dr Dellia Cloud  Problem # 2:  GERD (ICD-530.81) controlled His updated medication list for this problem includes:    Aciphex 20 Mg Tbec (Rabeprazole sodium) ..... One by mouth q day  Problem # 3:  DVT (ICD-453.40)  protime today discussed warfarin and diet---he voices understanding.   Problem # 4:  AORTIC REGURGITATION, MODERATE (ICD-424.1) s/p repair stop lasix/potassium His updated medication list for this problem includes:    Aspirin 81 Mg Tabs (Aspirin) .Marland Kitchen... Daily    Metoprolol Succinate 25 Mg Xr24h-tab (Metoprolol succinate) .Marland Kitchen... Take 1 tablet by mouth once a day    Coumadin 5 Mg Tabs (Warfarin sodium) .Marland Kitchen... Take one and half tab by mouth once daily  Complete Medication List: 1)  Lorazepam 0.5 Mg Tabs (Lorazepam) .... Take 1/2-1 tablet by mouth once a day as needed 2)  Trazodone Hcl 50 Mg Tabs  (Trazodone hcl) .... Take 1/2-1 by mouth at bedtime as needed 3)  Aspirin 81 Mg Tabs (Aspirin) .... Daily 4)  Aciphex 20 Mg Tbec (Rabeprazole sodium) .... One by mouth q day 5)  Metoprolol Succinate 25 Mg Xr24h-tab (Metoprolol succinate) .... Take 1 tablet by mouth once a day 6)  Coumadin 5 Mg Tabs (Warfarin sodium) .... Take one and half tab by mouth once daily 7)  Fluticasone Propionate 50 Mcg/act Susp (Fluticasone propionate) .... 2 sprays each nostril once daily 8)  Buspirone Hcl 5 Mg Tabs (Buspirone hcl) .... Two times a day  Other Orders: Protime ([id number]) Fingerstick (11914)  Patient Instructions: 1)  taper citalopram: 1/2 daily for one week and then 1/2 every other day for one week and then stop.  2)  start buspar in one week 3)  coumadin 10/7.5 alternating---CHECK IN 2 WEEKS Prescriptions: BUSPIRONE HCL 5 MG TABS (BUSPIRONE HCL) two times a day  #60 x 3   Entered and Authorized by:   [name]   Signed by:   [name] on 08/17/2010   Method used:   Electronically to        [facility]  [address]  [phone]* (retail)  [address] [place], [place]  [address]       Ph: [phone] or [phone]       Fax: [phone]   RxID:   [id number]   Laboratory Results   Blood Tests   Date/Time Recieved: August 17, 2010 8:31 AM  Date/Time Reported: August 17, 2010 8:31 AM    INR: 1.6   (Normal Range: 0.88-1.12   Therap INR: 2.0-3.5) Comments: Wynona Canes, CMA  August 17, 2010 8:31 AM       ANTICOAGULATION RECORD PREVIOUS REGIMEN & LAB RESULTS   Previous INR:  2.0 on  08/01/2010  Previous Regimen:  same on  08/01/2010 Previous Coagulation Comments:  Dr. Caryl Never approved on  08/01/2010  NEW REGIMEN & LAB RESULTS Anticoag. Dx: V58.83,V58.61,415.19 Current INR Goal Range: 2.0-3.0 Current INR: 1.6 Current Coumadin Dose(mg): 7mg  qd Regimen: By Dr.       Repeat testing in: By Dr. MEDICATIONS LORAZEPAM 0.5 MG TABS (LORAZEPAM) Take  1/2-1 tablet by mouth once a day as needed TRAZODONE HCL 50 MG TABS (TRAZODONE HCL) Take 1/2-1 by mouth at bedtime as needed ASPIRIN 81 MG  TABS (ASPIRIN) daily ACIPHEX 20 MG TBEC (RABEPRAZOLE SODIUM) one by mouth q day METOPROLOL SUCCINATE 25 MG XR24H-TAB (METOPROLOL SUCCINATE) Take 1 tablet by mouth once a day COUMADIN 5 MG TABS (WARFARIN SODIUM) take one and half tab by mouth once daily FLUTICASONE PROPIONATE 50 MCG/ACT  SUSP (FLUTICASONE PROPIONATE) 2 sprays each nostril once daily BUSPIRONE HCL 5 MG TABS (BUSPIRONE HCL) two times a day   Anticoagulation Visit Questionnaire      Coumadin dose missed/changed:  No      Abnormal Bleeding Symptoms:  No   Any diet changes including alcohol intake, vegetables or greens since the last visit:  No Any illnesses or hospitalizations since the last visit:  No Any signs of clotting since the last visit (including chest discomfort, dizziness, shortness of breath, arm tingling, slurred speech, swelling or redness in leg):  No    Appended Document: 3 WK ROV/NJR---add pt//ccm        Flu Vaccine Consent Questions     Do you have a history of severe allergic reactions to this vaccine? no    Any prior history of allergic reactions to egg and/or gelatin? no    Do you have a sensitivity to the preservative Thimersol? no    Do you have a past history of Guillan-Barre Syndrome? no    Do you currently have an acute febrile illness? no    Have you ever had a severe reaction to latex? no    Vaccine information given and explained to patient? yes    Are you currently pregnant? no    Lot Number:[id number]   Exp Date:05/11/2011   Site Given  Left Deltoid IM    Clinical Lists Changes  Orders: Added new Service order of Admin 1st Vaccine (64332) - Signed Added new Service order of Flu Vaccine 18yrs + [phone]) - Signed Observations: Added new observation of FLU VAX VIS: 06/05/10 version (08/17/2010 9:50) Added new observation of FLU VAXLOT:  [id number] (08/17/2010 9:50) Added new observation of FLU VAXMFR: [employer] (08/17/2010 9:50) Added new observation of FLU VAX EXP: 05/11/2011 (08/17/2010 9:50) Added new observation of FLU VAX DSE: 0.16ml (08/17/2010 9:50) Added new observation of FLU VAX: Fluvax 3+ (08/17/2010 9:50)

## 2010-12-11 NOTE — Progress Notes (Signed)
Summary: referral to ENT  Phone Note Call from Patient Call back at Home Phone [phone]   Caller: Patient Call For: [name] Summary of Call: Pt. would like to have a referral to Dr. Jenne Pane for dizziness, please.  ENT Initial call taken by: [facility] CMA AAMA,  September 14, 2010 10:22 AM  Follow-up for Phone Call        ok Follow-up by: [name],  September 14, 2010 12:07 PM  Additional Follow-up for Phone Call Additional follow up Details #1::        Phone Call Completed Additional Follow-up by: Alfred Levins, CMA,  September 14, 2010 3:45 PM

## 2010-12-11 NOTE — Letter (Signed)
Summary: [facility]: Physician Documentation Sheet  [facility]: Physician Documentation Sheet   Imported By: Earl Many 08/07/2010 14:46:47  _____________________________________________________________________  External Attachment:    Type:   Image     Comment:   External Document

## 2010-12-11 NOTE — Assessment & Plan Note (Signed)
Summary: f22m/dm  Medications Added OMNARIS 50 MCG/ACT SUSP (CICLESONIDE) as needed      Allergies Added:   Referring Provider:  Birdie Sons, MD  Primary Provider:  Birdie Sons, MD    History of Present Illness: Dayshaun is seen today in F/U of AV disease he had a trileaflet valve but has had significant AR for a while.  His last echo 11/10 showed severe AR with mild AS and moderate LVE.  He is doing well and denies dyspnea SSCP or syncope.  His murmur sounded quite different today with a harsh vibratory component that was mostly in diasole but difficult to separate from systole.  His exam is consistant with severe AR with bisferans carotid and enlarged PMI.  His BP is well controlled and he has been on ACE for afterload reductin.  I told Kegan that he needed and echo this week to see if the AV leaflets have changed or degenerated.  I will review his 11/10 and current echos with Dr Cornelius Moras to see what he thinks but I think that he may be approaching need for AVR.  The leaflet is fairly calcified and even if the main mechanism of regurgitation is a prolapsed leaflet I dont think the valve is repairable especially given the degree of current stenosis and Saumuel's relatively young age.  Current Problems (verified): 1)  Preventive Health Care  (ICD-V70.0) 2)  Gerd  (ICD-530.81) 3)  Atrial Premature Beats  (ICD-427.61) 4)  Hypertension  (ICD-401.9) 5)  Hyperlipidemia  (ICD-272.4) 6)  Atrial Fibrillation  (ICD-427.31) 7)  Mitral Regurgitation  (ICD-396.3) 8)  Hypercholesterolemia  (ICD-272.0) 9)  Insomnia-sleep Disorder-unspec  (ICD-780.52) 10)  Anxiety  (ICD-300.00) 11)  Aortic Regurgitation, Moderate  (ICD-424.1) 12)  Prostate Cancer, Hx of  (ICD-V10.46)  Current Medications (verified): 1)  Lorazepam 0.5 Mg Tabs (Lorazepam) .... Take 1/2-1 Tablet By Mouth Once A Day As Needed 2)  Lisinopril 10 Mg  Tabs (Lisinopril) .... 2 By Mouth Once Daily 3)  Citalopram Hydrobromide 40 Mg  Tabs  (Citalopram Hydrobromide) .... One By Mouth Daily 4)  Trazodone Hcl 50 Mg Tabs (Trazodone Hcl) .... Take 1/2-1 By Mouth At Bedtime As Needed 5)  Aspirin 81 Mg  Tabs (Aspirin) .... Daily 6)  Aciphex 20 Mg Tbec (Rabeprazole Sodium) .... Take 1 Tab Every Morning 7)  Omnaris 50 Mcg/act Susp (Ciclesonide) .... As Needed 8)  Diclofenac Sodium 75 Mg Tbec (Diclofenac Sodium) .... Take 1 Tablet By Mouth Once A Day  Allergies (verified): 1)  ! Morphine Sulfate Cr (Morphine Sulfate) 2)  ! Oxycontin (Oxycodone Hcl) 3)  ! Aspirin 4)  ! Lortab 5)  ! Micardis Hct (Telmisartan-Hctz) 6)  Lipitor (Atorvastatin Calcium) 7)  Altace (Ramipril) 8)  Codeine Phosphate (Codeine Phosphate)  Past History:  Past Medical History: Last updated: 03/27/2010 Current Problems:  HYPERTENSION (ICD-401.9) HYPERLIPIDEMIA (ICD-272.4) ATRIAL FIBRILLATION (ICD-427.31) MITRAL REGURGITATION (ICD-396.3) HYPERCHOLESTEROLEMIA (ICD-272.0) INSOMNIA-SLEEP DISORDER-UNSPEC (ICD-780.52) ANXIETY (ICD-300.00) AORTIC REGURGITATION, MODERATE (ICD-424.1) PROSTATE CANCER, HX OF (ICD-V10.46) ANTICOAGULATION THERAPY (ICD-V58.61)  Past Surgical History: Last updated: 12/11/2009 angiogram-"no blockage", pt's report  Erectile dysfunction   penile implant   Prostate surgery  prostatectomy Cataract extraction--left 12/06/09; right 09/14/09  Family History: Last updated: 01/09/2010 Family History High cholesterol Family History Hypertension Family History of Prostate CA 1st degree relative <50 sister---uterine Cancer No FH of Colon Cancer:  Social History: Last updated: 01/09/2010 Occupation:IT Profess[employer]  Single No childern Patient is a former smoker.  Alcohol Use - no Daily Caffeine Use: 2-3 daily  Illicit Drug  Use - no  Review of Systems       Denies fever, malais, weight loss, blurry vision, decreased visual acuity, cough, sputum, SOB, hemoptysis, pleuritic pain, palpitaitons, heartburn, abdominal pain, melena, lower  extremity edema, claudication, or rash.   Vital Signs:  Patient profile:   65 year old male Height:      70 inches Weight:      207 pounds BMI:     29.81 Pulse rate:   71 / minute Resp:     12 per minute BP sitting:   125 / 57  (left arm)  Vitals Entered By: Kem Parkinson (April 11, 2010 11:33 AM)  Physical Exam  General:  Affect appropriate Healthy:  appears stated age HEENT: normal Neck supple with no adenopathy JVP normal no bruits no thyromegaly carotids are prominant and bisferans Lungs clear with no wheezing and good diaphragmatic motion Heart:  S1/S2 vibratory AR murmur,rub, gallop or click PMI enlarged Abdomen: benighn, BS positve, no tenderness, no AAA no bruit.  No HSM or HJR Distal pulses intact with no bruits No edema Neuro non-focal Skin warm and dry    Impression & Recommendations:  Problem # 1:  AORTIC REGURGITATION, MODERATE (ICD-424.1) Change in murmur.  Last echo with ? prolapsing RCC.  Check echo.  Continue after load reduction.  May be time to consider AVR His updated medication list for this problem includes:    Lisinopril 10 Mg Tabs (Lisinopril) .Marland Kitchen... 2 by mouth once daily  Orders: Echocardiogram (Echo)  Problem # 2:  HYPERTENSION (ICD-401.9) Well controlled His updated medication list for this problem includes:    Lisinopril 10 Mg Tabs (Lisinopril) .Marland Kitchen... 2 by mouth once daily    Aspirin 81 Mg Tabs (Aspirin) .Marland Kitchen... Daily  Problem # 3:  HYPERLIPIDEMIA (ICD-272.4) At goal with no documented CAD CHOL: 223 (03/20/2010)   LDL: DEL (12/19/2008)   HDL: 43.50 (03/20/2010)   TG: 100.0 (03/20/2010)  Problem # 4:  ATRIAL PREMATURE BEATS (ICD-427.61) Stable no evidenc of afib which he has had in the distant past His updated medication list for this problem includes:    Lisinopril 10 Mg Tabs (Lisinopril) .Marland Kitchen... 2 by mouth once daily    Aspirin 81 Mg Tabs (Aspirin) .Marland Kitchen... Daily  Patient Instructions: 1)  Your physician recommends that you schedule  a follow-up appointment in: 3 MONTHS 2)  Your physician has requested that you have an echocardiogram.  Echocardiography is a painless test that uses sound waves to create images of your heart. It provides your doctor with information about the size and shape of your heart and how well your heart's chambers and valves are working.  This procedure takes approximately one hour. There are no restrictions for this procedure.ASAP WITH VANESSA

## 2010-12-11 NOTE — Progress Notes (Signed)
Summary: change meds  Phone Note Call from Patient   Caller: Patient Call For: [name] Summary of Call[facility] Does not want to take Paxil....Marland Kitchentook it before, and did not like it.  Wants anxiety RX. Initial call taken by: [facility] CMA AAMA,  September 06, 2010 1:39 PM  Follow-up for Phone Call        venlafaxin xr 75 mg p.o. q. day. remove paroxetine from medication list Follow-up by: [name],  September 06, 2010 7:42 PM    New/Updated Medications: EFFEXOR XR 75 MG XR24H-CAP (VENLAFAXINE HCL) one by mouth daily Prescriptions: EFFEXOR XR 75 MG XR24H-CAP (VENLAFAXINE HCL) one by mouth daily  #30 x 5   Entered by:   Lynann Beaver CMA AAMA   Authorized by:   [name]   Signed by:   Lynann Beaver CMA AAMA on 09/07/2010   Method used:   [facility]ron[facility] to        [facility]  [phone]* (retail)       [address] [place], [place]  [address]       Ph: [phone] or [phone]       Fax: [phone]   RxID:   [id number]

## 2010-12-11 NOTE — Progress Notes (Signed)
  Phone Note Call from Patient   Caller: Patient Call For: [name] Summary of Call: Pt is asking if he should be weighing himself everyday? [phone] Initial call taken by: Lynann Beaver CMA,  July 02, 2010 2:08 PM  Follow-up for Phone Call        probably not necessary but would be ok Follow-up by: [name],  July 02, 2010 3:42 PM  Additional Follow-up for Phone Call Additional follow up Details #1::        Any vitamins to take for energy? Additional Follow-up by: Lynann Beaver CMA,  July 02, 2010 4:41 PM

## 2010-12-11 NOTE — Procedures (Signed)
Summary: Colon   Colonoscopy  Procedure date:  02/04/2003  Findings:      Location:  [facility].   Patient Name: [name], [name] MRN:  Procedure Procedures: Colorectal cancer screening, average risk CPT: G0121.  Personnel: Endoscopist: Barbette Hair. Arlyce Dice, MD.  Referred By: Corwin Levins, MD.  Indications  Average Risk Screening Routine.  History  Pre-Exam Physical: Performed Feb 04, 2003. Entire physical exam was normal.  Exam Exam: Extent of exam reached: Cecum, extent intended: Cecum.  The cecum was identified by IC valve. Colon retroflexion performed. ASA Classification: I. Tolerance: good.  Monitoring: Pulse and BP monitoring, Oximetry used. Supplemental O2 given. at 2 Liters.  Colon Prep Used Visicol for colon prep. Prep results: fair, adequate exam.  Sedation Meds: Fentanyl 100 mcg. given IV. Versed 7 mg. given IV.  Findings NORMAL EXAM: Descending Colon.  NORMAL EXAM: Cecum.  - DIVERTICULOSIS: Sigmoid Colon. ICD9: Diverticulosis: 562.10. Comments: Few tics.  HEMORRHOIDS: Internal. ICD9: Hemorrhoids, Internal: 455.0.   Assessment Abnormal examination, see findings above.  Diagnoses: 562.10: Diverticulosis.  455.0: Hemorrhoids, Internal.   Events  Unplanned Interventions: No intervention was required.  Unplanned Events: There were no complications. Plans Patient Education: Patient given standard instructions for: Diverticulosis. Hemorrhoids.  Scheduling/Referral: Colonoscopy, to Barbette Hair. Arlyce Dice, MD, around Feb 04, 2008.    cc: Corwin Levins, MD    This report was created from the original endoscopy report, which was reviewed and signed by the above listed endoscopist.

## 2010-12-11 NOTE — Assessment & Plan Note (Signed)
Summary: CPX // RS   Vital Signs:  Patient profile:   65 year old male Height:      70 inches Weight:      205 pounds BMI:     29.52 Pulse rate:   60 / minute Pulse rhythm:   regular Resp:     12 per minute BP sitting:   138 / 60  (left arm) Cuff size:   regular  Vitals Entered By: Gladis Riffle, RN (Mar 27, 2010 10:20 AM) CC: cpx, labs done--endoscopy done yesterday Is Patient Diabetic? No   Primary Care Provider:  Birdie Sons, MD   CC:  cpx and labs done--endoscopy done yesterday.  History of Present Illness: CPX  has had some back pain---betterwith topical diclofenac  Preventive Screening-Counseling & Management  Alcohol-Tobacco     Smoking Status: quit     Year Quit: 1980  Current Problems (verified): 1)  Gerd  (ICD-530.81) 2)  Atrial Premature Beats  (ICD-427.61) 3)  Hypertension  (ICD-401.9) 4)  Hyperlipidemia  (ICD-272.4) 5)  Atrial Fibrillation  (ICD-427.31) 6)  Mitral Regurgitation  (ICD-396.3) 7)  Hypercholesterolemia  (ICD-272.0) 8)  Insomnia-sleep Disorder-unspec  (ICD-780.52) 9)  Anxiety  (ICD-300.00) 10)  Aortic Regurgitation, Moderate  (ICD-424.1) 11)  Prostate Cancer, Hx of  (ICD-V10.46)  Current Medications (verified): 1)  Lorazepam 0.5 Mg Tabs (Lorazepam) .... Take 1/2-1 Tablet By Mouth Once A Day As Needed 2)  Lisinopril 10 Mg  Tabs (Lisinopril) .... 2 By Mouth Once Daily 3)  Citalopram Hydrobromide 40 Mg  Tabs (Citalopram Hydrobromide) .... One By Mouth Daily 4)  Trazodone Hcl 50 Mg Tabs (Trazodone Hcl) .... Take 1/2-1 By Mouth At Bedtime As Needed 5)  Aspirin 81 Mg  Tabs (Aspirin) .... Daily 6)  Aciphex 20 Mg Tbec (Rabeprazole Sodium) .... Take 1 Tab Every Morning 7)  Omnaris 50 Mcg/act Susp (Ciclesonide) .... Use Daily 8)  Diclofenac Sodium 75 Mg Tbec (Diclofenac Sodium) .... Take 1 Tablet By Mouth Once A Day  Allergies: 1)  ! Morphine Sulfate Cr (Morphine Sulfate) 2)  ! Oxycontin (Oxycodone Hcl) 3)  ! Aspirin 4)  ! Lortab 5)  !  Micardis Hct (Telmisartan-Hctz) 6)  Lipitor (Atorvastatin Calcium) 7)  Altace (Ramipril) 8)  Codeine Phosphate (Codeine Phosphate)  Past History:  Past Surgical History: Last updated: 12/11/2009 angiogram-"no blockage", pt's report  Erectile dysfunction   penile implant   Prostate surgery  prostatectomy Cataract extraction--left 12/06/09; right 09/14/09  Family History: Last updated: 01/09/2010 Family History High cholesterol Family History Hypertension Family History of Prostate CA 1st degree relative <50 sister---uterine Cancer No FH of Colon Cancer:  Social History: Last updated: 01/09/2010 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  Alcohol Use - no Daily Caffeine Use: 2-3 daily  Illicit Drug Use - no  Risk Factors: Exercise: yes (06/23/2007)  Risk Factors: Smoking Status: quit (03/27/2010)  Past Medical History: Current Problems:  HYPERTENSION (ICD-401.9) HYPERLIPIDEMIA (ICD-272.4) ATRIAL FIBRILLATION (ICD-427.31) MITRAL REGURGITATION (ICD-396.3) HYPERCHOLESTEROLEMIA (ICD-272.0) INSOMNIA-SLEEP DISORDER-UNSPEC (ICD-780.52) ANXIETY (ICD-300.00) AORTIC REGURGITATION, MODERATE (ICD-424.1) PROSTATE CANCER, HX OF (ICD-V10.46) ANTICOAGULATION THERAPY (ICD-V58.61)  Review of Systems       All other systems reviewed and were negative   Physical Exam  General:  alert and well-developed.   Head:  normocephalic and atraumatic.   Eyes:  pupils equal and pupils round.   Ears:  R ear normal and L ear normal.   Neck:  supple and full ROM.   Lungs:  normal respiratory effort and no  intercostal retractions.   Heart:  normal rate and regular rhythm.   3/6 diastdecres murmur 2/6 HSM Abdomen:  soft and non-tender.   Msk:  No deformity or scoliosis noted of thoracic or lumbar spine.   Pulses:  R radial normal and L radial normal.   Neurologic:  cranial nerves II-XII intact and gait normal.     Impression &  Recommendations:  Problem # 1:  Preventive Health Care (ICD-V70.0) health maint UTD continue excellent health habits consider 10-20 pound weight loss  Problem # 2:  AORTIC REGURGITATION, MODERATE (ICD-424.1) Asx continue current medications  His updated medication list for this problem includes:    Aspirin 81 Mg Tabs (Aspirin) .Marland Kitchen... Daily  Complete Medication List: 1)  Lorazepam 0.5 Mg Tabs (Lorazepam) .... Take 1/2-1 tablet by mouth once a day as needed 2)  Lisinopril 10 Mg Tabs (Lisinopril) .... 2 by mouth once daily 3)  Citalopram Hydrobromide 40 Mg Tabs (Citalopram hydrobromide) .... One by mouth daily 4)  Trazodone Hcl 50 Mg Tabs (Trazodone hcl) .... Take 1/2-1 by mouth at bedtime as needed 5)  Aspirin 81 Mg Tabs (Aspirin) .... Daily 6)  Aciphex 20 Mg Tbec (Rabeprazole sodium) .... Take 1 tab every morning 7)  Omnaris 50 Mcg/act Susp (Ciclesonide) .... Use daily 8)  Diclofenac Sodium 75 Mg Tbec (Diclofenac sodium) .... Take 1 tablet by mouth once a day

## 2010-12-11 NOTE — Cardiovascular Report (Signed)
Summary: Cath/Percutaneous Orders  Cath/Percutaneous Orders   Imported By: Roderic Ovens 05/02/2010 14:53:14  _____________________________________________________________________  External Attachment:    Type:   Image     Comment:   External Document

## 2010-12-11 NOTE — Progress Notes (Signed)
Summary: Pt. needs approval for work extension   Phone Note Call from Patient Call back at [facility] Phone [phone] Call back at [phone]   Caller: Patient Reason for Call: Talk to Nurse Summary of Call: per pt calling . when he was d/c from hospital  he  was  told to back to work on 9/19  do half day until the 9/28. pt asking for extention until 10/5 base on his  insurance. Initial call taken by: Lorne Skeens,  August 03, 2010 11:57 AM  Follow-up for Phone Call        Left pt a voicemail stating that Dr. Eden Emms would have to approve this and he will not be back in the office until Tuesday 9/27. Will forward this to Debra's desktop for review with Nishan on Tuesday.  Whitney Maeola Sarah RN  August 03, 2010 12:10 PM  left message for pt, while he was in the hosp I had talked to his insurance and they knew he was not going to be returning to work as expected. pt to call me back if need to do anything else Deliah Goody, RN  August 08, 2010 10:22 AM\par

## 2010-12-11 NOTE — Progress Notes (Signed)
   Faxed all Cardiac over to [facility] w/ [facility] to fax [phone] [facility]  January 18, 2010 8:41 AM

## 2010-12-11 NOTE — Letter (Signed)
Summary: New Patient letter  [facility]  [address] [place], [place] [address]   Phone: [phone]  Fax: [phone]       12/13/2009 MRN: [id number]  [facility] [address] [place], [place]  [address]  Dear [name],  Welcome to the Gastroenterology Division at [facility].    You are scheduled to see Dr. Jarold Motto on 3/1/2011at 8:30AM on the 3rd floor at [facility], [address]  We ask that you try to arrive at our office 15 minutes prior to your appointment time to allow for check-in.  We would like you to complete the enclosed self-administered evaluation form prior to your visit and bring it with you on the day of your appointment.  We will review it with you.  Also, please bring a complete list of all your medications or, if you prefer, bring the medication bottles and we will list them.  Please bring your insurance card so that we may make a copy of it.  If your insurance requires a referral to see a specialist, please bring your referral form from your primary care physician.  Co-payments are due at the time of your visit and may be paid by cash, check or credit card.     Your office visit will consist of a consult with your physician (includes a physical exam), any laboratory testing he/she may order, scheduling of any necessary diagnostic testing (e.g. x-ray, ultrasound, CT-scan), and scheduling of a procedure (e.g. Endoscopy, Colonoscopy) if required.  Please allow enough time on your schedule to allow for any/all of these possibilities.    If you cannot keep your appointment, please call [phone] to cancel or reschedule prior to your appointment date.  This allows [name] the opportunity to schedule an appointment for another patient in need of care.  If you do not cancel or reschedule by 5 p.m. the business day prior to your appointment date, you will be charged a $50.00 late cancellation/no-show fee.    Thank you for choosing [facility] for your medical needs.  We appreciate the opportunity to care for you.  Please visit [name] at our website  to learn more about our practice.                     Sincerely,                                                             The Gastroenterology Division

## 2010-12-11 NOTE — Letter (Signed)
Summary: Cardiac Catheterization Instructions- Main Lab  [facility], Main Office  [address]   [place], [place] [address]   Phone: [phone]  Fax: [phone]     04/19/2010 MRN: [id number]  [facility] [address] [place], [place]  [address]  Dear Mr. Carn,   You are scheduled for Cardiac Catheterization on      04/23/10         with Dr.  Eden Emms          .  Please arrive at the [facility] at  1200     a.m./p.m. on the day of your procedure.  1. DIET     X____ Nothing to eat or drink after midnight except your medications with a sip of water.  2. Come to the [place] office on             for lab work.  The lab at [facility] is open from 8:30 a.m. to 1:30 p.m. and 2:30 p.m. to 5:00 p.m.  The lab at [address] [facility] is open from 7:30 a.m. to 5:30 p.m.  You do not have to be fasting.  3. MAKE SURE YOU TAKE YOUR ASPIRIN.  4. _____ DO NOT TAKE these medications before your procedure:         ________________________________________________________________________________      __X__ YOU MAY TAKE ALL of your remaining medications with a small amount of water.      ____ START NEW medications:     ________________________________________________________________________________      ____ Eilene Ghazi instructions:     ________________________________________________________________________________  5. Plan for one night stay - bring personal belongings (i.e. toothpaste, toothbrush, etc.)  6. Bring a current list of your medications and current insurance cards.  7. Must have a responsible person to drive you home.   8. Someone must be with yu for the first 24 hours after you arrive home.  9. Please wear clothes that are easy to get on and off and wear slip-on shoes.  *Special note: Every effort is made to have your procedure done on time.  Occasionally there are emergencies that present themselves at the  hospital that may cause delays.  Please be patient if a delay does occur.  If you have any questions after you get home, please call the office at the number listed above.  Scherrie Bateman, LPN / DR Charlton Haws

## 2010-12-11 NOTE — Procedures (Signed)
Summary: Upper Endoscopy  Patient: [name] Note: All result statuses are Final unless otherwise noted.  Tests: (1) Upper Endoscopy (EGD)   EGD Upper Endoscopy       DONE     [facility]     [address]     [place], [place]  [address]           ENDOSCOPY PROCEDURE REPORT           PATIENT:  Abimelec, Grochowski  MR#:  [id number]     BIRTHDATE:  [birth year], 64 yrs. old  GENDER:  male           ENDOSCOPIST:  Vania Rea. Jarold Motto, MD, [facility]     Referred by:           PROCEDURE DATE:  03/26/2010     PROCEDURE:  EGD with biopsy     ASA CLASS:  Class II     INDICATIONS:  chest pain, GERD           MEDICATIONS:   Fentanyl 50 mcg IV, Versed 5 mg IV     TOPICAL ANESTHETIC:  Exactacain Spray           DESCRIPTION OF PROCEDURE:   After the risks benefits and     alternatives of the procedure were thoroughly explained, informed     consent was obtained.  The [facility] GIF-H180 [id number] endoscope was     introduced through the mouth and advanced to the second portion of     the duodenum, without limitations.  The instrument was slowly     withdrawn as the mucosa was fully examined.     <<PROCEDUREIMAGES>>           A hiatal hernia was found. 5 CM HH NOTED.NO LES PRESSURE.  Normal     GE junction was noted.  The stomach was entered and closely     examined. The antrum, angularis, and lesser curvature were well     visualized, including a retroflexed view of the cardia and fundus.     The stomach wall was normally distensable. The scope passed easily     through the pylorus into the duodenum. CLO BX. OF ANTRUM DONE.     Normal duodenal folds were noted.    Retroflexed views revealed a     hiatal hernia.    The scope was then withdrawn from the patient     and the procedure completed.           COMPLICATIONS:  None           ENDOSCOPIC IMPRESSION:     1) Hiatal hernia     2) Normal GE junction     3) Normal stomach     4) A hiatal hernia     CHRONIC GERD.ON DAILY PPI RX.HH NOTED,BUT  NO BARRETT'S MUCOSA.E           RECOMMENDATIONS:     1) Await biopsy results     2) continue current meds     3) Anti-reflux regimen to be follow           REPEAT EXAM:  No           ______________________________     Vania Rea. Jarold Motto, MD, [facility]           CC:  Lindley Magnus, MD           n.     Rosalie DoctorVania Rea. Shailynn Fong at 03/26/2010  09:33 AM           [place], [place] [phone]  Note: An exclamation mark (!) indicates a result that was not dispersed into the flowsheet. Document Creation Date: 03/26/2010 9:33 AM _______________________________________________________________________  (1) Order result status: Final Collection or observation date-time: 03/26/2010 09:25 Requested date-time:  Receipt date-time:  Reported date-time:  Referring Physician:   Ordering Physician: Sheryn Bison [phone]) Specimen Source:  Source: Launa Grill Order Number: [id number] Lab site:

## 2010-12-11 NOTE — Progress Notes (Signed)
Summary: ? about meds  Phone Note Call from Patient Call back at Work Phone [phone]   Caller: [facility] mail Reason for Call: Talk to Nurse Summary of Call: Was given samples the other day. Not working. Forgot to tell the dr the other day, that his ears are aching also. Please return call. Initial call taken by: Warnell Forester,  December 14, 2009 1:05 PM  Follow-up for Phone Call        stay on same meds add ranitidine 150 mg by mouth at bedtime he has GI appt unclear what ear issue might be---very likely will spontaneiously resolve Follow-up by: [name],  December 14, 2009 2:32 PM  Additional Follow-up for Phone Call Additional follow up Details #1::        Better today.  Additional Follow-up by: Lynann Beaver CMA,  December 15, 2009 10:31 AM

## 2010-12-11 NOTE — Miscellaneous (Signed)
Summary: Lec previsit  Clinical Lists Changes  Observations: Added new observation of ALLERGY REV: Done (03/15/2010 8:03)

## 2010-12-11 NOTE — Assessment & Plan Note (Signed)
Summary: GERD...AS.    History of Present Illness Visit Type: consult  Primary GI MD: Sheryn Bison MD FACP FAGA Primary Provider: Birdie Sons, MD  Requesting Provider: Birdie Sons, MD  Chief Complaint: GERD  History of Present Illness:   65 year old African American male referred by Dr. Cato Mulligan for evaluation of acid reflux with associated regurgitation and burning substernal chest pain that began in the fall of last year.  Mr. Erlene Quan has a long history of cardiac problems with aortic insufficiency and recurrent atrial fibrillation. He is followed closely by Dr. Charlton Haws in cardiology and currently is in a normal rhythm and is not anticoagulated and denies any symptoms of congestive heart failure or other cardiopulmonary complaints. He is rather typical acid reflux that began last fall, did not respond to over-the-counter Prilosec, but he currently is doing much better on AcipHex 20 mg at bedtime. There's been no associated dysphagia, anorexia, weight loss, or hepatobiliary complaints. He denies lower gastrointestinal problems and had colonoscopy with Dr. Arlyce Dice several years ago. He has not had previous upper GI series or endoscopy. He does take trazodone and lorazepam daily. He also has mild essential hypertension. His had previous surgery for prostate cancer. He denies any systemic complaints. He also denies abuse of alcohol, cigarettes, or NSAIDs.   GI Review of Systems    Reports acid reflux and  heartburn.      Denies abdominal pain, belching, bloating, chest pain, dysphagia with liquids, dysphagia with solids, loss of appetite, nausea, vomiting, vomiting blood, weight loss, and  weight gain.        Denies anal fissure, black tarry stools, change in bowel habit, constipation, diarrhea, diverticulosis, fecal incontinence, heme positive stool, hemorrhoids, irritable bowel syndrome, jaundice, light color stool, liver problems, rectal bleeding, and  rectal pain.    Current  Medications (verified): 1)  Lorazepam 0.5 Mg Tabs (Lorazepam) .... Take 1/2-1 Tablet By Mouth Once A Day As Needed 2)  Lisinopril 10 Mg  Tabs (Lisinopril) .... 2 By Mouth Once Daily 3)  Citalopram Hydrobromide 40 Mg  Tabs (Citalopram Hydrobromide) .... One By Mouth Daily 4)  Trazodone Hcl 50 Mg Tabs (Trazodone Hcl) .... Take 1/2-1 By Mouth At Bedtime As Needed 5)  Aspirin 81 Mg  Tabs (Aspirin) .... Daily 6)  Aciphex 20 Mg Tbec (Rabeprazole Sodium) .... Take 1 Tab Every Morning  Allergies (verified): 1)  ! Morphine Sulfate Cr (Morphine Sulfate) 2)  ! Oxycontin (Oxycodone Hcl) 3)  ! Aspirin 4)  ! Lortab 5)  ! Micardis Hct (Telmisartan-Hctz) 6)  Lipitor (Atorvastatin Calcium) 7)  Altace (Ramipril) 8)  Codeine Phosphate (Codeine Phosphate)  Past History:  Past medical, surgical, family and social histories (including risk factors) reviewed for relevance to current acute and chronic problems.  Past Medical History: Reviewed history from 04/07/2009 and no changes required. Current Problems:  HYPERTENSION (ICD-401.9) HYPERLIPIDEMIA (ICD-272.4) ATRIAL FIBRILLATION (ICD-427.31) MITRAL REGURGITATION (ICD-396.3) MURMUR (ICD-785.2) HYPERCHOLESTEROLEMIA (ICD-272.0) INSOMNIA-SLEEP DISORDER-UNSPEC (ICD-780.52) ANXIETY (ICD-300.00) AORTIC REGURGITATION, MODERATE (ICD-424.1) PROSTATE CANCER, HX OF (ICD-V10.46) ANTICOAGULATION THERAPY (ICD-V58.61) NIGHTMARES (ICD-307.47) BACK PAIN (ICD-724.5)  Past Surgical History: Reviewed history from 12/11/2009 and no changes required. angiogram-"no blockage", pt's report  Erectile dysfunction   penile implant   Prostate surgery  prostatectomy Cataract extraction--left 12/06/09; right 09/14/09  Family History: Reviewed history from 07/01/2008 and no changes required. Family History High cholesterol Family History Hypertension Family History of Prostate CA 1st degree relative <50 sister---uterine Cancer No FH of Colon Cancer:  Social  History: Reviewed history from 06/23/2007 and  no changes required. 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  Alcohol Use - no Daily Caffeine Use: 2-3 daily  Illicit Drug Use - no Smoking Status:  quit  Review of Systems       The patient complains of allergy/sinus, change in vision, and heart murmur.  The patient denies anemia, anxiety-new, arthritis/joint pain, back pain, blood in urine, breast changes/lumps, confusion, cough, coughing up blood, depression-new, fainting, fatigue, fever, headaches-new, hearing problems, heart rhythm changes, itching, muscle pains/cramps, night sweats, nosebleeds, shortness of breath, skin rash, sleeping problems, sore throat, swelling of feet/legs, swollen lymph glands, thirst - excessive, urination - excessive, urination changes/pain, urine leakage, vision changes, and voice change.   Eyes:  Complains of blurring, diplopia, and irritation; denies discharge, vision loss, scotoma, eye pain, and photophobia; He has a macular tear in his right with planned surgery in the next several weeks.. CV:  Denies chest pains, angina, palpitations, syncope, dyspnea on exertion, orthopnea, PND, peripheral edema, and claudication. Resp:  Denies dyspnea at rest, dyspnea with exercise, cough, sputum, wheezing, coughing up blood, and pleurisy. GI:  Complains of indigestion/heartburn; denies difficulty swallowing, pain on swallowing, nausea, vomiting, vomiting blood, abdominal pain, jaundice, gas/bloating, diarrhea, constipation, change in bowel habits, bloody BM's, black BMs, and fecal incontinence. GU:  Denies urinary burning, blood in urine, urinary frequency, urinary hesitancy, nocturnal urination, urinary incontinence, penile discharge, genital sores, decreased libido, and erectile dysfunction; He denies current genitourinary problems.. MS:  Denies joint pain / LOM, joint swelling, joint stiffness, joint deformity, low back pain, muscle weakness,  muscle cramps, muscle atrophy, leg pain at night, leg pain with exertion, and shoulder pain / LOM hand / wrist pain (CTS). Allergy:  Complains of hay fever; denies hives, rash, sneezing, and recurrent infections.  Vital Signs:  Patient profile:   65 year old male Height:      70 inches Weight:      207 pounds BMI:     29.81 BSA:     2.12 Pulse rate:   72 / minute Pulse rhythm:   regular BP sitting:   124 / 68 Cuff size:   regular  Vitals Entered By: Ok Anis CMA (January 09, 2010 8:45 AM)  Physical Exam  General:  Well developed, well nourished, no acute distress.healthy appearing.   Head:  Normocephalic and atraumatic. Eyes:  PERRLA, no icterus.exam deferred to patient's ophthalmologist.   Neck:  Supple; no masses or thyromegaly. Lungs:  Clear throughout to auscultation. Heart:  Very audible 3/6 honking murmur at left lower sternal border. I cannot appreciate any rubs or S3 gallop. Abdomen:  Soft, nontender and nondistended. No masses, hepatosplenomegaly or hernias noted. Normal bowel sounds. Rectal:  deferred Msk:  Symmetrical with no gross deformities. Normal posture. Extremities:  No clubbing, cyanosis, edema or deformities noted. Neurologic:  Alert and  oriented x4;  grossly normal neurologically. Cervical Nodes:  No significant cervical adenopathy. Psych:  Alert and cooperative. Normal mood and affect.   Impression & Recommendations:  Problem # 1:  GERD (ICD-530.81) Assessment Improved Continue Daily AcipHex 20 mg. A large part of his symptoms are nocturnal and he seemed to be doing well with PPI therapy before bedtime. I have reviewed her reflex regime with him and he has seen her patient education video on acid reflux and its management. Endoscopy has been scheduled after his surgery to exclude Barrett's mucosa. He has no symptoms of hepatobiliary disease. I reviewed his colonoscopy and I cannot see where he  needs colonoscopy exam at this time.  Problem # 2:   HYPERTENSION (ICD-401.9) Assessment: Improved Blood Pressure today is 124/68 and pulse was 72 and regular. I've asked to continue his lisinopril and aspirin as listed in his chart with appropriate followup per Dr. Cato Mulligan as needed. Apparently there has been some discussion about valve replacement for his rather severe chronic aortic insufficiency. He certainly has no symptoms of congestive heart failure or physical exam symptoms of CHF at this time. As per previous notation, he is not on Coumadin therapy but does take a daily aspirin tablet.  Problem # 3:  ATRIAL FIBRILLATION (ICD-427.31) Assessment: Improved  Problem # 4:  AORTIC REGURGITATION, MODERATE (ICD-424.1) Assessment: Comment Only  Problem # 5:  PROSTATE CANCER, HX OF (ICD-V10.46) Assessment: Unchanged He Denies any genitourinary problems at this time.  Patient Instructions: 1)  Copy sent to : Dr. Birdie Sons 2)  Please continue current medications.  3)  Avoid foods high in acid content ( tomatoes, citrus juices, spicy foods) . Avoid eating within 3 to 4 hours of lying down or before exercising. Do not over eat; try smaller more frequent meals. Elevate head of bed four inches when sleeping.  4)  Conscious Sedation brochure given.  5)  Upper Endoscopy brochure given.  6)  Endoscopy in several weeks after retinal surgery. 7)  The medication list was reviewed and reconciled.  All changed / newly prescribed medications were explained.  A complete medication list was provided to the patient / caregiver.  Appended Document: GERD...AS. Pt will call back after eye surgery and sch procedure.

## 2010-12-11 NOTE — Progress Notes (Signed)
Summary: Refill Lorazepam  Phone Note From Pharmacy   Summary of Call: [facility] phone number [phone] fax number [phone]  Request is to Refill Lorazepam tab 0.5mg   Last office visit was 07-27-10 Last refill was: 12-15-09 for 90 tabs with Zero refills Ok to refill? Initial call taken by: Kathrynn Speed CMA,  July 30, 2010 4:06 PM  Follow-up for Phone Call        ok to refill #90/ 1  refill    Prescriptions: LORAZEPAM 0.5 MG TABS (LORAZEPAM) Take 1/2-1 tablet by mouth once a day as needed  #90 day x 1   Entered by:   Kathrynn Speed CMA   Authorized by:   [name]   Signed by:   Kern Reap CMA (AAMA) on 07/31/2010   Method used:   Print then Give to Patient   RxID:   [id number]  Rx was called into [facility], wrong button push
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
## 2010-12-11 NOTE — Medication Information (Signed)
Summary: rov/mj   Anticoagulant Therapy  Managed by: Weston Brass, PharmD Referring MD: Dr. Charlton Haws PCP: Birdie Sons, MD  Supervising MD: Tenny Craw MD, Gunnar Fusi Indication 1: DVT Indication 2: Pulmonary Embolism Lab Used: [facility] Site: [address] INR POC 2.5 INR RANGE 2.0-3.0  Dietary changes: no    Health status changes: no    Bleeding/hemorrhagic complications: no    Recent/future hospitalizations: no    Any changes in medication regimen? yes       Details: taking ultram for HA  Recent/future dental: no  Any missed doses?: no       Is patient compliant with meds? yes       Allergies: 1)  ! Morphine Sulfate Cr (Morphine Sulfate) 2)  ! Oxycontin (Oxycodone Hcl) 3)  ! Aspirin 4)  ! Lortab 5)  ! Micardis Hct (Telmisartan-Hctz) 6)  Lipitor (Atorvastatin Calcium) 7)  Altace (Ramipril) 8)  Codeine Phosphate (Codeine Phosphate) 9)  Buspirone Hcl (Buspirone Hcl)  Anticoagulation Management History:      The patient is taking warfarin and comes in today for a routine follow up visit.  Negative risk factors for bleeding include an age less than 65 years old.  The bleeding index is 'low risk'.  Positive CHADS2 values include History of HTN.  Negative CHADS2 values include Age > 65 years old.  His last INR was 2.8.  Anticoagulation responsible provider: Tenny Craw MD, Gunnar Fusi.  INR POC: 2.5.  Cuvette Lot#: [id number].  Exp: 10/2011.    Anticoagulation Management Assessment/Plan:      The patient's current anticoagulation dose is Warfarin sodium 5 mg tabs: Use as directed by Anticoagulation Clinic.  The target INR is 2.0-3.0.  The next INR is due 10/25/2010.  Anticoagulation instructions were given to patient.  Results were reviewed/authorized by Weston Brass, PharmD.  He was notified by Weston Brass PharmD.         Prior Anticoagulation Instructions: INR 2.1 Change dose to 10mg s daily except 15mg s on Mondays and Fridays. REcheck in 7-10 days.   Current  Anticoagulation Instructions: INR 2.5  Continue same dose of 2 tablets every day except 3 tablets on Monday and Friday.  Recheck INR in 2 weeks.

## 2010-12-11 NOTE — Letter (Signed)
Summary: EGD Instructions  [facility]  [address] [place], [place] [address]   Phone: [phone]  Fax: [phone]       [name]    10/09/1946    MRN: [id number]       Procedure Day [name]: [name] 03/26/10     Arrival Time: 8:30am     Procedure Time: 9:30am     Location of Procedure:                    [facility] (4th Floor)    PREPARATION FOR ENDOSCOPY   On MONDAY 03/26/10, THE DAY OF THE PROCEDURE:  1.   No solid foods, milk or milk products are allowed after midnight the night before your procedure.  2.   Do not drink anything colored red or purple.  Avoid juices with pulp.  No orange juice.  3.  You may drink clear liquids until 7:30am , which is 2 hours before your procedure.                                                                                                CLEAR LIQUIDS INCLUDE: Water Jello Ice Popsicles Tea (sugar ok, no milk/cream) Powdered fruit flavored drinks Coffee (sugar ok, no milk/cream) Gatorade Juice: apple, white grape, white cranberry  Lemonade Clear bullion, consomm, broth Carbonated beverages (any kind) Strained chicken noodle soup Hard Candy   MEDICATION INSTRUCTIONS  Unless otherwise instructed, you should take regular prescription medications with a small sip of water as early as possible the morning of your procedure.              OTHER INSTRUCTIONS  You will need a responsible adult at least 65 years of age to accompany you and drive you home.   This person must remain in the waiting room during your procedure.  Wear loose fitting clothing that is easily removed.  Leave jewelry and other valuables at home.  However, you may wish to bring a book to read or an iPod/MP3 player to listen to music as you wait for your procedure to start.  Remove all body piercing jewelry and leave at home.  Total time from sign-in until discharge is approximately 2-3 hours.  You should go  home directly after your procedure and rest.  You can resume normal activities the day after your procedure.  The day of your procedure you should not:   Drive   Make legal decisions   Operate machinery   Drink alcohol   Return to work  You will receive specific instructions about eating, activities and medications before you leave.    The above instructions have been reviewed and explained to me by    Ulis Rias RN  Mar 15, 2010 8:23 AM    I fully understand and can verbalize these instructions _____________________________ Date _________

## 2010-12-11 NOTE — Assessment & Plan Note (Signed)
Summary: fu on gerd/njr/pt rsc from bmp/cjr   Vital Signs:  Patient profile:   65 year old male Weight:      206 pounds Temp:     98.2 degrees F Pulse rate:   68 / minute Resp:     12 per minute BP sitting:   138 / 64  (left arm)  Vitals Entered By: Gladis Riffle, RN (December 11, 2009 11:26 AM) CC: Fu GERD, states "pretty good" Is Patient Diabetic? No   CC:  Fu GERD and states "pretty good".  History of Present Illness: GERD---has had sxs intermittently for years. seems to be somewhat worse recently.  used prilosec years ago--? results no fever or chills  no unusual foods All other systems reviewed and were negative   Preventive Screening-Counseling & Management  Alcohol-Tobacco     Smoking Status: never     Year Quit: 1980  Medications Prior to Update: 1)  Lorazepam 0.5 Mg Tabs (Lorazepam) .... Take 1/2-1 Tablet By Mouth Once A Day As Needed 2)  Lisinopril 10 Mg  Tabs (Lisinopril) .... 2 By Mouth Once Daily 3)  Citalopram Hydrobromide 40 Mg  Tabs (Citalopram Hydrobromide) .... One By Mouth Daily 4)  Singulair 10 Mg Tabs (Montelukast Sodium) .... Take 1 Tablet By Mouth Once A Day 5)  Trazodone Hcl 50 Mg Tabs (Trazodone Hcl) .... Take 1/2-1 By Mouth At Bedtime As Needed 6)  Aspirin 81 Mg  Tabs (Aspirin) .... Daily 7)  Nasonex 50 Mcg/act Susp (Mometasone Furoate) .... As Needed  Allergies: 1)  ! Morphine Sulfate Cr (Morphine Sulfate) 2)  ! Oxycontin (Oxycodone Hcl) 3)  ! Aspirin 4)  ! Lortab 5)  ! Micardis Hct (Telmisartan-Hctz) 6)  Lipitor (Atorvastatin Calcium) 7)  Altace (Ramipril) 8)  Codeine Phosphate (Codeine Phosphate)  Comments:  Nurse/Medical Assistant: FU GERD--states is "pretty good"  The patient's medications and allergies were reviewed with the patient and were updated in the Medication and Allergy Lists. Gladis Riffle, RN (December 11, 2009 11:28 AM)  Past History:  Past Surgical History: angiogram-"no blockage", pt's report  Erectile dysfunction    penile implant   Prostate surgery  prostatectomy Cataract extraction--left 12/06/09; right 09/14/09  Physical Exam  General:  Well-developed,well-nourished,in no acute distress; alert,appropriate and cooperative throughout examination Head:  normocephalic and atraumatic.   Eyes:  pupils equal and pupils round.   Neck:  No deformities, masses, or tenderness noted. Abdomen:  soft, non-tender, and no masses.     Impression & Recommendations:  Problem # 1:  GERD (ICD-530.81) trial PPI samples given call if sxs persist needs GI appt for endo and colonoscopy His updated medication list for this problem includes:    Aciphex 20 Mg Tbec (Rabeprazole sodium) .Marland Kitchen... Take 1 tab every morning  Orders: Gastroenterology Referral (GI)  Complete Medication List: 1)  Lorazepam 0.5 Mg Tabs (Lorazepam) .... Take 1/2-1 tablet by mouth once a day as needed 2)  Lisinopril 10 Mg Tabs (Lisinopril) .... 2 by mouth once daily 3)  Citalopram Hydrobromide 40 Mg Tabs (Citalopram hydrobromide) .... One by mouth daily 4)  Singulair 10 Mg Tabs (Montelukast sodium) .... Take 1 tablet by mouth once a day 5)  Trazodone Hcl 50 Mg Tabs (Trazodone hcl) .... Take 1/2-1 by mouth at bedtime as needed 6)  Aspirin 81 Mg Tabs (Aspirin) .... Daily 7)  Nasonex 50 Mcg/act Susp (Mometasone furoate) .... As needed 8)  Aciphex 20 Mg Tbec (Rabeprazole sodium) .... Take 1 tab every morning Prescriptions: ACIPHEX 20 MG TBEC (  RABEPRAZOLE SODIUM) Take 1 tab every morning  #90 x 3   Entered and Authorized by:   [name]   Signed by:   [name] on 12/11/2009   Method used:   [facility]        [facility]  [phone]* (retail)       [address] [place], [place]  [address]       Ph: [phone] or [phone]       Fax: [phone]   RxID:   [id number]

## 2010-12-11 NOTE — Progress Notes (Signed)
   Faxed Pt ROI he completed sent back I faxed his Cardiac Records over to [facility]/Dr. Judd Gaudier @ fax [phone] [facility]  April 23, 2010 10:49 AM

## 2010-12-11 NOTE — Letter (Signed)
Summary: [facility]-Thoracic Surgery  [facility]   Imported By: Maryln Gottron 05/22/2010 13:56:45  _____________________________________________________________________  External Attachment:    Type:   Image     Comment:   External Document

## 2010-12-11 NOTE — Progress Notes (Signed)
  Phone Note Call from Patient Call back at Home Phone [phone]   Caller: Patient Call For: [name] Summary of Call: Pt is having severe headaches and dizziness with Buspirone. Initial call taken by: Lynann Beaver CMA AAMA,  August 31, 2010 1:46 PM  Follow-up for Phone Call        dc meds I'll suggest alternative next week Follow-up by: [name],  August 31, 2010 4:10 PM  Additional Follow-up for Phone Call Additional follow up Details #1::        pt is aware and will wait for Dr Cato Mulligan answer. Additional Follow-up by: Warnell Forester,  August 31, 2010 4:14 PM    Additional Follow-up for Phone Call Additional follow up Details #2::    discontinue BuSpar. Would recommend low dose anti-anxiety medicine. Start paroxetine 10 mg p.o. q. day. Follow-up by: [name],  September 01, 2010 5:09 PM  Additional Follow-up for Phone Call Additional follow up Details #3:: Details for Additional Follow-up Action Taken: [facility]   [facility] CMA AAMA,  September 03, 2010 9:18 AM   pt aware, rx sent into pharmacy  Alfred Levins, [facility]  September 03, 2010 1:58 PM  New/Updated Medications: PAROXETINE HCL 10 MG TABS (PAROXETINE HCL) one by mouth daily Prescriptions: PAROXETINE HCL 10 MG TABS (PAROXETINE HCL) one by mouth daily  #30 x 5   Entered by:   Alfred Levins, CMA   Authorized by:   [name]   Signed by:   Alfred Levins, CMA on 09/03/2010   Method used:   [facility]        [facility]  [phone]* (retail)       [address] [place], [place]  [address]       Ph: [phone] or [phone]       Fax: [phone]   RxID:   [id number]

## 2010-12-11 NOTE — Progress Notes (Signed)
Summary: SOB   Phone Note Call from Patient Call back at [phone]   Caller: Patient Reason for Call: Talk to Doctor Summary of Call: sob.... going on for about an hr Initial call taken by: Migdalia Dk,  December 18, 2009 12:47 PM  Follow-up for Phone Call        Left message to call back Deliah Goody, RN  December 18, 2009 1:11 PM  pt returning call [phone] , Migdalia Dk  December 18, 2009 1:14 PM   spoke with pt, he states today he has noticed that he is more SOB and tired than usual. he notes walking across the parking lot into work, walking up the stairs is very exhausting and more SOB than as before. no edema or other symptoms. will discuss with dr Verdis Prime, RN  December 18, 2009 1:18 PM   pt calling again, request to speak to nurse again, Migdalia Dk  December 18, 2009 2:42 PM   Additional Follow-up for Phone Call Additional follow up Details #1::        discussed with dr Eden Emms, pt to watch and let me know if cont to have problems Deliah Goody, RN  December 18, 2009 4:05 PM

## 2010-12-11 NOTE — Procedures (Signed)
Summary: Colonoscopy/[facility]  [facility]   Imported By: Lanelle Bal 03/27/2010 14:19:05  _____________________________________________________________________  External Attachment:    Type:   Image     Comment:   External Document

## 2010-12-13 NOTE — Medication Information (Signed)
Summary: rov/sp  Medications Added FISH OIL 1000 MG CAPS (OMEGA-3 FATTY ACIDS) 1 capsule daily.       Anticoagulant Therapy  Managed by: Eda Keys, PharmD Referring MD: Dr. Charlton Haws PCP: Birdie Sons, MD  Supervising MD: Graciela Husbands MD, Viviann Spare Indication 1: DVT Indication 2: Pulmonary Embolism Lab Used: [facility] [place] Site: [address] INR POC 2.6 INR RANGE 2.0-3.0  Dietary changes: no    Health status changes: no    Bleeding/hemorrhagic complications: no    Recent/future hospitalizations: no    Any changes in medication regimen? yes       Details: discontinued Aciphex  Recent/future dental: no  Any missed doses?: no       Is patient compliant with meds? yes       Allergies: 1)  ! Morphine Sulfate Cr (Morphine Sulfate) 2)  ! Oxycontin (Oxycodone Hcl) 3)  ! Aspirin 4)  ! Lortab 5)  ! Micardis Hct (Telmisartan-Hctz) 6)  ! * Acipex 7)  Lipitor (Atorvastatin Calcium) 8)  Altace (Ramipril) 9)  Buspirone Hcl (Buspirone Hcl) 10)  Codeine Phosphate (Codeine Phosphate)  Anticoagulation Management History:      The patient is taking warfarin and comes in today for a routine follow up visit.  Negative risk factors for bleeding include an age less than 11 years old.  The bleeding index is 'low risk'.  Positive CHADS2 values include History of HTN.  Negative CHADS2 values include Age > 67 years old.  His last INR was 2.8.  Anticoagulation responsible provider: Graciela Husbands MD, Viviann Spare.  INR POC: 2.6.  Exp: 10/2011.    Anticoagulation Management Assessment/Plan:      The patient's current anticoagulation dose is Warfarin sodium 5 mg tabs: Use as directed by Anticoagulation Clinic.  The target INR is 2.0-3.0.  The next INR is due 11/22/2010.  Anticoagulation instructions were given to patient.  Results were reviewed/authorized by Eda Keys, PharmD.  He was notified by Eda Keys.         Prior Anticoagulation Instructions: INR 2.5  Continue same  dose of 2 tablets every day except 3 tablets on Monday and Friday.  Recheck INR in 2 weeks.   Current Anticoagulation Instructions: INR 2.6  Continue taking 3 tablets on Monday and Friday and 2 tablets all other days.  Return to clinic in 4 weeks.

## 2010-12-13 NOTE — Assessment & Plan Note (Signed)
Summary: 3 month rov.sl  Medications Added METOPROLOL SUCCINATE 25 MG XR24H-TAB (METOPROLOL SUCCINATE) Take 1 tablet by mouth once a day        Visit Type:  Follow-up Referring Provider:  Birdie Sons, MD  Primary Provider:  Birdie Sons, MD   CC:  Tiredness and Sob.  History of Present Illness: [name] is seen for F/U of anticoagulation post AVR, history of PAF.  DVT with large bilateral PE 9/11.  F/U CT and LE [name] with resolving thrombus burden 09/18/10.  Fairly long period of subRx anticoagulation on 2 separate courses of Lovenox.  INR now Rx.  Still some dyspnea and fatigue.  No SSCP, palpitations, edema.  No bleeding problems.    Current Problems (verified): 1)  Migraine  (ICD-346.90) 2)  Coumadin Therapy  (ICD-V58.61) 3)  Encounter For Therapeutic Drug Monitoring  (ICD-V58.83) 4)  Dvt  (ICD-453.40) 5)  Pulmonary Embolism  (ICD-415.19) 6)  Preventive Health Care  (ICD-V70.0) 7)  Gerd  (ICD-530.81) 8)  Hypertension  (ICD-401.9) 9)  Hyperlipidemia  (ICD-272.4) 10)  Atrial Fibrillation  (ICD-427.31) 11)  Mitral Regurgitation  (ICD-396.3) 12)  Hypercholesterolemia  (ICD-272.0) 13)  Insomnia-sleep Disorder-unspec  (ICD-780.52) 14)  Anxiety  (ICD-300.00) 15)  Aortic Regurgitation, Moderate  (ICD-424.1) 16)  Prostate Cancer, Hx of  (ICD-V10.46)  Current Medications (verified): 1)  Lorazepam 0.5 Mg Tabs (Lorazepam) .... Take 1/2-1 Tablet By Mouth Once A Day As Needed 2)  Aspirin 81 Mg  Tabs (Aspirin) .... Daily 3)  Metoprolol Succinate 25 Mg Xr24h-Tab (Metoprolol Succinate) .... Take 1 Tablet By Mouth Once A Day 4)  Warfarin Sodium 5 Mg Tabs (Warfarin Sodium) .... Use As Directed By Anticoagulation Clinic 5)  Ultram 50 Mg Tabs (Tramadol Hcl) .... One By Mouth Three Times A Day As Needed Headache  Allergies: 1)  ! Morphine Sulfate Cr (Morphine Sulfate) 2)  ! Oxycontin (Oxycodone Hcl) 3)  ! Aspirin 4)  ! Lortab 5)  ! Micardis Hct (Telmisartan-Hctz) 6)  ! * Acipex 7)  Lipitor  (Atorvastatin Calcium) 8)  Altace (Ramipril) 9)  Buspirone Hcl (Buspirone Hcl) 10)  Codeine Phosphate (Codeine Phosphate)  Past History:  Past Medical History: Last updated: 03/27/2010 Current Problems:  HYPERTENSION (ICD-401.9) HYPERLIPIDEMIA (ICD-272.4) ATRIAL FIBRILLATION (ICD-427.31) MITRAL REGURGITATION (ICD-396.3) HYPERCHOLESTEROLEMIA (ICD-272.0) INSOMNIA-SLEEP DISORDER-UNSPEC (ICD-780.52) ANXIETY (ICD-300.00) AORTIC REGURGITATION, MODERATE (ICD-424.1) PROSTATE CANCER, HX OF (ICD-V10.46) ANTICOAGULATION THERAPY (ICD-V58.61)  Past Surgical History: Last updated: 06/25/2010 angiogram-"no blockage", pt's report  Erectile dysfunction   penile implant   Prostate surgery  prostatectomy Cataract extraction--left 12/06/09; right 09/14/09 AVR---[facility] 2011  Family History: Last updated: 01/09/2010 Family History High cholesterol Family History Hypertension Family History of Prostate CA 1st degree relative <50 sister---uterine Cancer No FH of Colon Cancer:  Social History: Last updated: 01/09/2010 Occupation:IT [employer]  Single No childern Patient is a former smoker.  Alcohol Use - no Daily Caffeine Use: 2-3 daily  Illicit Drug Use - no  Review of Systems       Denies fever, malais, weight loss, blurry vision, decreased visual acuity, cough, sputum,, hemoptysis, pleuritic pain, palpitaitons, heartburn, abdominal pain, melena, lower extremity edema, claudication, or rash.   Vital Signs:  Patient profile:   65 year old male Height:      70 inches Weight:      202 pounds BMI:     29.09 Pulse rate:   88 / minute Pulse rhythm:   regular Resp:     18 per minute BP sitting:   128 / 84  (left arm)  Cuff size:   large  Vitals Entered By: Vikki Ports (October 25, 2010 3:50 PM)  Physical Exam  General:  Affect appropriate Healthy:  appears stated age HEENT: normal Neck supple with no adenopathy JVP normal no bruits no thyromegaly Lungs clear with no  wheezing and good diaphragmatic motion Heart:  S1/S2 prosthetic sound  no murmur,rub, gallop or click PMI normal Abdomen: benighn, BS positve, no tenderness, no AAA no bruit.  No HSM or HJR Distal pulses intact with no bruits No edema Neuro non-focal Skin warm and dry    Impression & Recommendations:  Problem # 1:  COUMADIN THERAPY (ICD-V58.61) Adjust coumadin in clinic.  Lovenox overlap done.    Problem # 2:  DVT (ICD-453.40) Resolving by duplex. NO postphlebitic syndrome  Continue coumaidn Orders: Arterial Duplex Lower Extremity (Arterial Duplex Low)  Problem # 3:  PULMONARY EMBOLISM (ICD-415.19) Continue coumadin Good resolution of thrombus burden by CT His updated medication list for this problem includes:    Aspirin 81 Mg Tabs (Aspirin) .Marland Kitchen... Daily    Warfarin Sodium 5 Mg Tabs (Warfarin sodium) ..... Use as directed by anticoagulation clinic  Problem # 4:  AORTIC REGURGITATION, MODERATE (ICD-424.1) S/P AVR normal exam. No AR His updated medication list for this problem includes:    Metoprolol Succinate 25 Mg Xr24h-tab (Metoprolol succinate) .Marland Kitchen... Take 1 tablet by mouth once a day  Patient Instructions: 1)  Your physician recommends that you schedule a follow-up appointment in: 3 MONTHS 2)  Your physician has requested that you have a lower  extremity arterial duplex.  This test is an ultrasound of the arteries in the legs or arms.  It looks at arterial blood flow in the legs and arms.  Allow one hour for Lower and Upper Arterial scans. There are no restrictions or special instructions.

## 2010-12-13 NOTE — Progress Notes (Signed)
Summary: refills  Phone Note Refill Request Call back at Home Phone [phone] Call back at Work Phone [phone] Message from:  Patient--LIVE CALL  Refills Requested: Medication #1:  LORAZEPAM 0.5 MG TABS Take 1/2-1 tablet by mouth once a day as needed [facility] [address]  Initial call taken by: Warnell Forester,  December 03, 2010 8:44 AM  Follow-up for Phone Call        sent in a rx in September to [facility] with 1 refill.  Called pt and he said that he has a rough time with [facility] and doesn't want to deal with them anymore.  Wants it called into a local pharmacy.  Also, Dr Kirtland Bouchard gave him ciatalopram and it is working but he can't sleep now.  He had a refill on Trazodone and it is helping him sleep.  Can he get a refill on the Trazodone also? Follow-up by: Alfred Levins, CMA,  December 04, 2010 3:59 PM  Additional Follow-up for Phone Call Additional follow up Details #1::        ok for all Additional Follow-up by: [name],  December 05, 2010 3:27 PM    Additional Follow-up for Phone Call Additional follow up Details #2::    rx called in, pt aware Follow-up by: Alfred Levins, CMA,  December 05, 2010 4:18 PM  Prescriptions: LORAZEPAM 0.5 MG TABS (LORAZEPAM) Take 1/2-1 tablet by mouth once a day as needed  #30 x 5   Entered by:   Alfred Levins, CMA   Authorized by:   [name]   Signed by:   Alfred Levins, CMA on 12/05/2010   Method used:   Telephoned to ...       [facility]  [phone]* (retail)       [address] [place], [place]  [address]       Ph: [phone] or [phone]       Fax: [phone]   RxID:   [id number] TRAZODONE HCL 50 MG TABS (TRAZODONE HCL) 1 tablet each night at bedtime  #30 x 5   Entered by:   Alfred Levins, CMA   Authorized by:   [name]   Signed by:   Alfred Levins, CMA on 12/05/2010   Method used:   Electronically to        [facility]  [phone]* (retail)       [address] [place], [place]  [address]       Ph: [phone] or [phone]       Fax: [phone]   RxID:   [id number]

## 2010-12-13 NOTE — Medication Information (Signed)
Summary: rov/eac  Anticoagulant Therapy  Managed by: Eda Keys, PharmD Referring MD: Dr. Charlton Haws PCP: Birdie Sons, MD  Supervising MD: Ladona Ridgel MD, Sharlot Gowda Indication 1: DVT Indication 2: Pulmonary Embolism Lab Used: [facility] [place] Site: [address] INR POC 2.0 INR RANGE 2.0-3.0  Dietary changes: no    Health status changes: no    Bleeding/hemorrhagic complications: no    Recent/future hospitalizations: no    Any changes in medication regimen? yes       Details: Added trazodone and citalopram  Recent/future dental: no  Any missed doses?: no       Is patient compliant with meds? yes       Current Medications (verified): 1)  Lorazepam 0.5 Mg Tabs (Lorazepam) .... Take 1/2-1 Tablet By Mouth Once A Day As Needed 2)  Aspirin 81 Mg  Tabs (Aspirin) .... Daily 3)  Metoprolol Succinate 25 Mg Xr24h-Tab (Metoprolol Succinate) .... Take 1 Tablet By Mouth Once A Day 4)  Warfarin Sodium 5 Mg Tabs (Warfarin Sodium) .... Use As Directed By Anticoagulation Clinic 5)  Ultram 50 Mg Tabs (Tramadol Hcl) .... One By Mouth Three Times A Day As Needed Headache 6)  Fish Oil 1000 Mg Caps (Omega-3 Fatty Acids) .Marland Kitchen.. 1 Capsule Daily. 7)  Citalopram Hydrobromide 40 Mg Tabs (Citalopram Hydrobromide) .... One Daily 8)  Trazodone Hcl 50 Mg Tabs (Trazodone Hcl) .Marland Kitchen.. 1 Tablet Each Night At Bedtime  Allergies: 1)  ! Morphine Sulfate Cr (Morphine Sulfate) 2)  ! Oxycontin (Oxycodone Hcl) 3)  ! Aspirin 4)  ! Lortab 5)  ! Micardis Hct (Telmisartan-Hctz) 6)  ! * Acipex 7)  Lipitor (Atorvastatin Calcium) 8)  Altace (Ramipril) 9)  Buspirone Hcl (Buspirone Hcl) 10)  Codeine Phosphate (Codeine Phosphate)  Anticoagulation Management History:      The patient is taking warfarin and comes in today for a routine follow up visit.  Negative risk factors for bleeding include an age less than 63 years old.  The bleeding index is 'low risk'.  Positive CHADS2 values include History of  HTN.  Negative CHADS2 values include Age > 55 years old.  His last INR was 2.8.  Anticoagulation responsible provider: Ladona Ridgel MD, Sharlot Gowda.  INR POC: 2.0.  Cuvette Lot#: [id number].  Exp: 12/2011.    Anticoagulation Management Assessment/Plan:      The patient's current anticoagulation dose is Warfarin sodium 5 mg tabs: Use as directed by Anticoagulation Clinic.  The target INR is 2.0-3.0.  The next INR is due 12/20/2010.  Anticoagulation instructions were given to patient.  Results were reviewed/authorized by Eda Keys, PharmD.  He was notified by Eda Keys.         Prior Anticoagulation Instructions: INR 2.6  Continue taking 3 tablets on Monday and Friday and 2 tablets all other days.  Return to clinic in 4 weeks.    Current Anticoagulation Instructions: INR 2.0  Continue taking 3 tablets on Monday and Friday and 2 tablets all other days.  Return to clinic in 4 weeks.

## 2010-12-13 NOTE — Assessment & Plan Note (Signed)
Summary: panic attacks/dm    Vital Signs:  Patient profile:   65 year old male Weight:      203 pounds Temp:     98.4 degrees F oral BP sitting:   120 / 80  (left arm) Cuff size:   regular  Vitals Entered By: Duard Brady LPN (November 07, 2010 7:58 AM) CC: panic attacks x 1 month  Is Patient Diabetic? No   Primary Care Provider:  Birdie Sons, MD   CC:  panic attacks x 1 month .  History of Present Illness: 65 -year-old patient who has a long history of panic disorder.  He self discontinued citalopram approximately 4 months ago.  More recently, he has had a flare of his panic attacks.  He does take alprazolam p.r.n.with little benefit.  He has treated hypertension, which has been stable.  Three days ago he resumed his citalopram at a dose of 20 mg daily.  He is on chronic anticoagulation therapy, following a pulmonary embolism earlier this summer.  Allergies: 1)  ! Morphine Sulfate Cr (Morphine Sulfate) 2)  ! Oxycontin (Oxycodone Hcl) 3)  ! Aspirin 4)  ! Lortab 5)  ! Micardis Hct (Telmisartan-Hctz) 6)  ! * Acipex 7)  Lipitor (Atorvastatin Calcium) 8)  Altace (Ramipril) 9)  Buspirone Hcl (Buspirone Hcl) 10)  Codeine Phosphate (Codeine Phosphate)  Past History:  Past Medical History: Reviewed history from 03/27/2010 and no changes required. Current Problems:  HYPERTENSION (ICD-401.9) HYPERLIPIDEMIA (ICD-272.4) ATRIAL FIBRILLATION (ICD-427.31) MITRAL REGURGITATION (ICD-396.3) HYPERCHOLESTEROLEMIA (ICD-272.0) INSOMNIA-SLEEP DISORDER-UNSPEC (ICD-780.52) ANXIETY (ICD-300.00) AORTIC REGURGITATION, MODERATE (ICD-424.1) PROSTATE CANCER, HX OF (ICD-V10.46) ANTICOAGULATION THERAPY (ICD-V58.61)  Past Surgical History: Reviewed history from 06/25/2010 and no changes required. angiogram-"no blockage", pt's report  Erectile dysfunction   penile implant   Prostate surgery  prostatectomy Cataract extraction--left 12/06/09; right 09/14/09 AVR---[facility] 2011  Review of  Systems       The patient complains of anorexia.  The patient denies fever, weight loss, weight gain, vision loss, decreased hearing, hoarseness, chest pain, syncope, dyspnea on exertion, peripheral edema, prolonged cough, headaches, hemoptysis, abdominal pain, melena, hematochezia, severe indigestion/heartburn, hematuria, incontinence, genital sores, muscle weakness, suspicious skin lesions, transient blindness, difficulty walking, depression, unusual weight change, abnormal bleeding, enlarged lymph nodes, angioedema, breast masses, and testicular masses.    Physical Exam  General:  Well-developed,well-nourished,in no acute distress; alert,appropriate and cooperative throughout examination; normal blood pressure Psych:  Cognition and judgment appear intact. Alert and cooperative with normal attention span and concentration. No apparent delusions, illusions, hallucinations   Impression & Recommendations:  Problem # 1:  HYPERTENSION (ICD-401.9)  His updated medication list for this problem includes:    Metoprolol Succinate 25 Mg Xr24h-tab (Metoprolol succinate) .Marland Kitchen... Take 1 tablet by mouth once a day  His updated medication list for this problem includes:    Metoprolol Succinate 25 Mg Xr24h-tab (Metoprolol succinate) .Marland Kitchen... Take 1 tablet by mouth once a day  Problem # 2:  ANXIETY (ICD-300.00)  His updated medication list for this problem includes:    Lorazepam 0.5 Mg Tabs (Lorazepam) .Marland Kitchen... Take 1/2-1 tablet by mouth once a day as needed    Citalopram Hydrobromide 40 Mg Tabs (Citalopram hydrobromide) ..... One daily  His updated medication list for this problem includes:    Lorazepam 0.5 Mg Tabs (Lorazepam) .Marland Kitchen... Take 1/2-1 tablet by mouth once a day as needed    Citalopram Hydrobromide 40 Mg Tabs (Citalopram hydrobromide) ..... One daily  Complete Medication List: 1)  Lorazepam 0.5 Mg Tabs (Lorazepam) .... Take  1/2-1 tablet by mouth once a day as needed 2)  Aspirin 81 Mg Tabs (Aspirin)  .... Daily 3)  Metoprolol Succinate 25 Mg Xr24h-tab (Metoprolol succinate) .... Take 1 tablet by mouth once a day 4)  Warfarin Sodium 5 Mg Tabs (Warfarin sodium) .... Use as directed by anticoagulation clinic 5)  Ultram 50 Mg Tabs (Tramadol hcl) .... One by mouth three times a day as needed headache 6)  Fish Oil 1000 Mg Caps (Omega-3 fatty acids) .Marland Kitchen.. 1 capsule daily. 7)  Citalopram Hydrobromide 40 Mg Tabs (Citalopram hydrobromide) .... One daily  Patient Instructions: 1)  Please schedule a follow-up appointment in 3 months. 2)  Limit your Sodium (Salt) to less than 2 grams a day(slightly less than 1/2 a teaspoon) to prevent fluid retention, swelling, or worsening of symptoms. 3)  It is important that you exercise regularly at least 20 minutes 5 times a week. If you develop chest pain, have severe difficulty breathing, or feel very tired , stop exercising immediately and seek medical attention. Prescriptions: CITALOPRAM HYDROBROMIDE 40 MG TABS (CITALOPRAM HYDROBROMIDE) one daily  #90 x 4   Entered and Authorized by:   Gordy Savers  MD   Signed by:   Gordy Savers  MD on 11/07/2010   Method used:   Print then Give to Patient   RxID:   [id number] CITALOPRAM HYDROBROMIDE 40 MG TABS (CITALOPRAM HYDROBROMIDE) one daily  #90 x 4   Entered and Authorized by:   Gordy Savers  MD   Signed by:   Gordy Savers  MD on 11/07/2010   Method used:   Electronically to        [facility]  [phone]* (retail)       [address] [place], [place]  [address]       Ph: [phone] or [phone]       Fax: [phone]   RxID:   [id number]    Orders Added: 1)  Est. Patient Level III [87564]

## 2010-12-14 NOTE — Letter (Signed)
Summary: [facility]  [facility]   Imported By: Maryln Gottron 06/14/2010 13:05:09  _____________________________________________________________________  External Attachment:    Type:   Image     Comment:   External Document

## 2010-12-20 ENCOUNTER — Ambulatory Visit (INDEPENDENT_AMBULATORY_CARE_PROVIDER_SITE_OTHER): Payer: 59 | Admitting: Psychology

## 2010-12-20 ENCOUNTER — Encounter (INDEPENDENT_AMBULATORY_CARE_PROVIDER_SITE_OTHER): Payer: 59

## 2010-12-20 ENCOUNTER — Encounter: Payer: Self-pay | Admitting: Internal Medicine

## 2010-12-20 DIAGNOSIS — I2699 Other pulmonary embolism without acute cor pulmonale: Secondary | ICD-10-CM

## 2010-12-20 DIAGNOSIS — I80299 Phlebitis and thrombophlebitis of other deep vessels of unspecified lower extremity: Secondary | ICD-10-CM

## 2010-12-20 DIAGNOSIS — F4321 Adjustment disorder with depressed mood: Secondary | ICD-10-CM

## 2010-12-20 LAB — CONVERTED CEMR LAB: POC INR: 1.9

## 2010-12-27 NOTE — Medication Information (Signed)
Summary: Coumadin Clinic  Anticoagulant Therapy  Managed by: Weston Brass, PharmD Referring MD: Dr. Charlton Haws PCP: Birdie Sons, MD  Supervising MD: Johney Frame MD, Fayrene Fearing Indication 1: DVT Indication 2: Pulmonary Embolism Lab Used: [facility] [place] Site: [address] INR POC 1.9 INR RANGE 2.0-3.0  Dietary changes: no    Health status changes: no    Bleeding/hemorrhagic complications: no    Recent/future hospitalizations: no    Any changes in medication regimen? yes       Details: has been on muscle relaxant for back pain  Recent/future dental: no  Any missed doses?: no       Is patient compliant with meds? yes       Allergies: 1)  ! Morphine Sulfate Cr (Morphine Sulfate) 2)  ! Oxycontin (Oxycodone Hcl) 3)  ! Aspirin 4)  ! Lortab 5)  ! Micardis Hct (Telmisartan-Hctz) 6)  ! * Acipex 7)  Lipitor (Atorvastatin Calcium) 8)  Altace (Ramipril) 9)  Buspirone Hcl (Buspirone Hcl) 10)  Codeine Phosphate (Codeine Phosphate)  Anticoagulation Management History:      The patient is taking warfarin and comes in today for a routine follow up visit.  Negative risk factors for bleeding include an age less than 47 years old.  The bleeding index is 'low risk'.  Positive CHADS2 values include History of HTN.  Negative CHADS2 values include Age > 41 years old.  His last INR was 2.8.  Anticoagulation responsible provider: Oluwadarasimi Redmon MD, Fayrene Fearing.  INR POC: 1.9.  Cuvette Lot#: [id number].  Exp: 11/2011.    Anticoagulation Management Assessment/Plan:      The patient's current anticoagulation dose is Warfarin sodium 5 mg tabs: Use as directed by Anticoagulation Clinic.  The target INR is 2.0-3.0.  The next INR is due 01/10/2011.  Anticoagulation instructions were given to patient.  Results were reviewed/authorized by Weston Brass, PharmD.  He was notified by Weston Brass PharmD.         Prior Anticoagulation Instructions: INR 2.0  Continue taking 3 tablets on Monday and Friday and 2  tablets all other days.  Return to clinic in 4 weeks.    Current Anticoagulation Instructions: INR 1.9  Take 3 tablets today then resume same dose of 2 tablets every day except 3 tablets on Monday and Friday. Recheck INR in 3 weeks.

## 2010-12-31 DIAGNOSIS — I4891 Unspecified atrial fibrillation: Secondary | ICD-10-CM

## 2010-12-31 DIAGNOSIS — I82409 Acute embolism and thrombosis of unspecified deep veins of unspecified lower extremity: Secondary | ICD-10-CM

## 2010-12-31 DIAGNOSIS — I2699 Other pulmonary embolism without acute cor pulmonale: Secondary | ICD-10-CM

## 2011-01-03 ENCOUNTER — Ambulatory Visit (INDEPENDENT_AMBULATORY_CARE_PROVIDER_SITE_OTHER): Payer: 59 | Admitting: Psychology

## 2011-01-03 DIAGNOSIS — F4321 Adjustment disorder with depressed mood: Secondary | ICD-10-CM

## 2011-01-10 ENCOUNTER — Encounter (INDEPENDENT_AMBULATORY_CARE_PROVIDER_SITE_OTHER): Payer: 59

## 2011-01-10 ENCOUNTER — Encounter: Payer: Self-pay | Admitting: Internal Medicine

## 2011-01-10 DIAGNOSIS — Z7901 Long term (current) use of anticoagulants: Secondary | ICD-10-CM

## 2011-01-10 DIAGNOSIS — I80299 Phlebitis and thrombophlebitis of other deep vessels of unspecified lower extremity: Secondary | ICD-10-CM

## 2011-01-10 DIAGNOSIS — I2699 Other pulmonary embolism without acute cor pulmonale: Secondary | ICD-10-CM

## 2011-01-10 LAB — CONVERTED CEMR LAB: POC INR: 2.3

## 2011-01-15 ENCOUNTER — Telehealth: Payer: Self-pay | Admitting: Cardiovascular Disease

## 2011-01-16 ENCOUNTER — Encounter (INDEPENDENT_AMBULATORY_CARE_PROVIDER_SITE_OTHER): Payer: Self-pay | Admitting: *Deleted

## 2011-01-16 ENCOUNTER — Encounter: Payer: Self-pay | Admitting: Cardiovascular Disease

## 2011-01-17 ENCOUNTER — Ambulatory Visit (INDEPENDENT_AMBULATORY_CARE_PROVIDER_SITE_OTHER): Payer: 59 | Admitting: Psychology

## 2011-01-17 DIAGNOSIS — F4321 Adjustment disorder with depressed mood: Secondary | ICD-10-CM

## 2011-01-17 NOTE — Medication Information (Signed)
Summary: rov/sp  Anticoagulant Therapy  Managed by: Windell Hummingbird, RN Referring MD: Dr. Charlton Haws PCP: Birdie Sons, MD  Supervising MD: Tenny Craw MD, Gunnar Fusi Indication 1: DVT Indication 2: Pulmonary Embolism Lab Used: [facility] [place] Site: [address] INR POC 2.3 INR RANGE 2.0-3.0  Dietary changes: no    Health status changes: no    Bleeding/hemorrhagic complications: no    Recent/future hospitalizations: no    Any changes in medication regimen? no    Recent/future dental: no  Any missed doses?: no       Is patient compliant with meds? yes       Allergies: 1)  ! Morphine Sulfate Cr (Morphine Sulfate) 2)  ! Oxycontin (Oxycodone Hcl) 3)  ! Aspirin 4)  ! Lortab 5)  ! Micardis Hct (Telmisartan-Hctz) 6)  ! * Acipex 7)  Lipitor (Atorvastatin Calcium) 8)  Altace (Ramipril) 9)  Buspirone Hcl (Buspirone Hcl) 10)  Codeine Phosphate (Codeine Phosphate)  Anticoagulation Management History:      The patient is taking warfarin and comes in today for a routine follow up visit.  Negative risk factors for bleeding include an age less than 59 years old.  The bleeding index is 'low risk'.  Positive CHADS2 values include History of HTN.  Negative CHADS2 values include Age > 27 years old.  His last INR was 2.8.  Anticoagulation responsible provider: Tenny Craw MD, Gunnar Fusi.  INR POC: 2.3.  Cuvette Lot#: [id number].  Exp: 11/2011.    Anticoagulation Management Assessment/Plan:      The patient's current anticoagulation dose is Warfarin sodium 5 mg tabs: Use as directed by Anticoagulation Clinic.  The target INR is 2.0-3.0.  The next INR is due 02/07/2011.  Anticoagulation instructions were given to patient.  Results were reviewed/authorized by Windell Hummingbird, RN.  He was notified by Windell Hummingbird, RN.         Prior Anticoagulation Instructions: INR 1.9  Take 3 tablets today then resume same dose of 2 tablets every day except 3 tablets on Monday and Friday. Recheck INR in 3 weeks.    Current Anticoagulation Instructions: INR 2.3 Continue 2 tablets every day, except take 3 tablets on Mondays and Fridays. Recheck in 4 weeks.

## 2011-01-18 ENCOUNTER — Encounter: Payer: Self-pay | Admitting: Cardiovascular Disease

## 2011-01-18 ENCOUNTER — Encounter (INDEPENDENT_AMBULATORY_CARE_PROVIDER_SITE_OTHER): Payer: 59

## 2011-01-18 DIAGNOSIS — I87009 Postthrombotic syndrome without complications of unspecified extremity: Secondary | ICD-10-CM

## 2011-01-22 NOTE — Miscellaneous (Signed)
Summary: Orders Update  Clinical Lists Changes  Orders: Added new Test order of Venous Duplex Lower Extremity (Venous Duplex Lower) - Signed 

## 2011-01-22 NOTE — Progress Notes (Signed)
Summary: pt having numbness and tingling in leg-TALK TO DEBRA*  Phone Note Call from Patient Call back at [phone]   Caller: Patient Reason for Call: Talk to Nurse, Talk to Doctor Summary of Call: pt wanted sooner appt than 3/21 but I couldn't find anything so patient wants to know should he be concerned because the numbness and tingleing has gotten worse Initial call taken by: Omer Jack,  January 15, 2011 9:45 AM  Follow-up for Phone Call        LVMTCB* I sch. LEA for tomorrow @ 1pm. I  need pt. to call back to confirm. Whitney Maeola Sarah RN  January 15, 2011 10:03 AM  Pt. had to rsch. to Friday 01/18/11 @ noon. Whitney Maeola Sarah RN  January 15, 2011 12:41 PM  Follow-up by: Whitney Maeola Sarah RN,  January 15, 2011 10:03 AM  Additional Follow-up for Phone Call Additional follow up Details #1::        ok Additional Follow-up by: Colon Branch, MD, [facility],  January 15, 2011 11:47 AM    Additional Follow-up for Phone Call Additional follow up Details #2::    Venous he has a history of DVT and PE Follow-up by: Colon Branch, MD, [facility],  January 16, 2011 8:37 AM

## 2011-01-24 LAB — PROTIME-INR
INR: 1.1 (ref 0.00–1.49)
INR: 1.11 (ref 0.00–1.49)
INR: 1.2 (ref 0.00–1.49)
INR: 1.3 (ref 0.00–1.49)
INR: 1.53 — ABNORMAL HIGH (ref 0.00–1.49)
INR: 1.75 — ABNORMAL HIGH (ref 0.00–1.49)
INR: 1.84 — ABNORMAL HIGH (ref 0.00–1.49)
INR: 2.05 — ABNORMAL HIGH (ref 0.00–1.49)
INR: 2.14 — ABNORMAL HIGH (ref 0.00–1.49)
Prothrombin Time: 14.4 seconds (ref 11.6–15.2)
Prothrombin Time: 14.5 seconds (ref 11.6–15.2)
Prothrombin Time: 15.4 seconds — ABNORMAL HIGH (ref 11.6–15.2)
Prothrombin Time: 16.4 seconds — ABNORMAL HIGH (ref 11.6–15.2)
Prothrombin Time: 18.6 seconds — ABNORMAL HIGH (ref 11.6–15.2)
Prothrombin Time: 20.6 seconds — ABNORMAL HIGH (ref 11.6–15.2)
Prothrombin Time: 21.4 seconds — ABNORMAL HIGH (ref 11.6–15.2)
Prothrombin Time: 23.3 seconds — ABNORMAL HIGH (ref 11.6–15.2)
Prothrombin Time: 24.1 seconds — ABNORMAL HIGH (ref 11.6–15.2)

## 2011-01-24 LAB — CBC
HCT: 42.4 % (ref 39.0–52.0)
HCT: 43.2 % (ref 39.0–52.0)
HCT: 43.2 % (ref 39.0–52.0)
HCT: 43.4 % (ref 39.0–52.0)
HCT: 44 % (ref 39.0–52.0)
HCT: 44 % (ref 39.0–52.0)
HCT: 45 % (ref 39.0–52.0)
HCT: 45.7 % (ref 39.0–52.0)
Hemoglobin: 14.4 g/dL (ref 13.0–17.0)
Hemoglobin: 14.4 g/dL (ref 13.0–17.0)
Hemoglobin: 14.4 g/dL (ref 13.0–17.0)
Hemoglobin: 14.7 g/dL (ref 13.0–17.0)
Hemoglobin: 14.8 g/dL (ref 13.0–17.0)
Hemoglobin: 14.9 g/dL (ref 13.0–17.0)
Hemoglobin: 15.3 g/dL (ref 13.0–17.0)
Hemoglobin: 15.3 g/dL (ref 13.0–17.0)
MCH: 30 pg (ref 26.0–34.0)
MCH: 30 pg (ref 26.0–34.0)
MCH: 30.2 pg (ref 26.0–34.0)
MCH: 30.3 pg (ref 26.0–34.0)
MCH: 30.3 pg (ref 26.0–34.0)
MCH: 30.6 pg (ref 26.0–34.0)
MCH: 30.7 pg (ref 26.0–34.0)
MCH: 30.8 pg (ref 26.0–34.0)
MCHC: 33.3 g/dL (ref 30.0–36.0)
MCHC: 33.3 g/dL (ref 30.0–36.0)
MCHC: 33.4 g/dL (ref 30.0–36.0)
MCHC: 33.5 g/dL (ref 30.0–36.0)
MCHC: 33.9 g/dL (ref 30.0–36.0)
MCHC: 34 g/dL (ref 30.0–36.0)
MCHC: 34 g/dL (ref 30.0–36.0)
MCHC: 34.1 g/dL (ref 30.0–36.0)
MCV: 88.2 fL (ref 78.0–100.0)
MCV: 88.7 fL (ref 78.0–100.0)
MCV: 88.8 fL (ref 78.0–100.0)
MCV: 90.3 fL (ref 78.0–100.0)
MCV: 90.4 fL (ref 78.0–100.0)
MCV: 90.8 fL (ref 78.0–100.0)
MCV: 91.7 fL (ref 78.0–100.0)
MCV: 92 fL (ref 78.0–100.0)
Platelets: 139 10*3/uL — ABNORMAL LOW (ref 150–400)
Platelets: 179 10*3/uL (ref 150–400)
Platelets: 181 10*3/uL (ref 150–400)
Platelets: 181 10*3/uL (ref 150–400)
Platelets: 200 10*3/uL (ref 150–400)
Platelets: 208 10*3/uL (ref 150–400)
Platelets: 218 10*3/uL (ref 150–400)
Platelets: 221 10*3/uL (ref 150–400)
RBC: 4.69 MIL/uL (ref 4.22–5.81)
RBC: 4.71 MIL/uL (ref 4.22–5.81)
RBC: 4.76 MIL/uL (ref 4.22–5.81)
RBC: 4.87 MIL/uL (ref 4.22–5.81)
RBC: 4.89 MIL/uL (ref 4.22–5.81)
RBC: 4.96 MIL/uL (ref 4.22–5.81)
RBC: 4.97 MIL/uL (ref 4.22–5.81)
RBC: 5.1 MIL/uL (ref 4.22–5.81)
RDW: 12.8 % (ref 11.5–15.5)
RDW: 12.9 % (ref 11.5–15.5)
RDW: 12.9 % (ref 11.5–15.5)
RDW: 12.9 % (ref 11.5–15.5)
RDW: 13 % (ref 11.5–15.5)
RDW: 13 % (ref 11.5–15.5)
RDW: 13.2 % (ref 11.5–15.5)
RDW: 13.3 % (ref 11.5–15.5)
WBC: 6.2 10*3/uL (ref 4.0–10.5)
WBC: 6.9 10*3/uL (ref 4.0–10.5)
WBC: 7.1 10*3/uL (ref 4.0–10.5)
WBC: 7.2 10*3/uL (ref 4.0–10.5)
WBC: 7.3 10*3/uL (ref 4.0–10.5)
WBC: 7.3 10*3/uL (ref 4.0–10.5)
WBC: 7.9 10*3/uL (ref 4.0–10.5)
WBC: 8.6 10*3/uL (ref 4.0–10.5)

## 2011-01-24 LAB — BASIC METABOLIC PANEL
BUN: 10 mg/dL (ref 6–23)
BUN: 10 mg/dL (ref 6–23)
BUN: 11 mg/dL (ref 6–23)
BUN: 11 mg/dL (ref 6–23)
BUN: 12 mg/dL (ref 6–23)
BUN: 12 mg/dL (ref 6–23)
BUN: 13 mg/dL (ref 6–23)
BUN: 14 mg/dL (ref 6–23)
BUN: 16 mg/dL (ref 6–23)
CO2: 25 mEq/L (ref 19–32)
CO2: 27 mEq/L (ref 19–32)
CO2: 28 mEq/L (ref 19–32)
CO2: 28 mEq/L (ref 19–32)
CO2: 28 mEq/L (ref 19–32)
CO2: 29 mEq/L (ref 19–32)
CO2: 30 mEq/L (ref 19–32)
CO2: 30 mEq/L (ref 19–32)
CO2: 31 mEq/L (ref 19–32)
Calcium: 8.9 mg/dL (ref 8.4–10.5)
Calcium: 9 mg/dL (ref 8.4–10.5)
Calcium: 9.1 mg/dL (ref 8.4–10.5)
Calcium: 9.3 mg/dL (ref 8.4–10.5)
Calcium: 9.3 mg/dL (ref 8.4–10.5)
Calcium: 9.4 mg/dL (ref 8.4–10.5)
Calcium: 9.5 mg/dL (ref 8.4–10.5)
Calcium: 9.5 mg/dL (ref 8.4–10.5)
Calcium: 9.5 mg/dL (ref 8.4–10.5)
Chloride: 101 mEq/L (ref 96–112)
Chloride: 102 mEq/L (ref 96–112)
Chloride: 103 mEq/L (ref 96–112)
Chloride: 103 mEq/L (ref 96–112)
Chloride: 103 mEq/L (ref 96–112)
Chloride: 103 mEq/L (ref 96–112)
Chloride: 104 mEq/L (ref 96–112)
Chloride: 105 mEq/L (ref 96–112)
Chloride: 106 mEq/L (ref 96–112)
Creatinine, Ser: 1.16 mg/dL (ref 0.4–1.5)
Creatinine, Ser: 1.18 mg/dL (ref 0.4–1.5)
Creatinine, Ser: 1.19 mg/dL (ref 0.4–1.5)
Creatinine, Ser: 1.23 mg/dL (ref 0.4–1.5)
Creatinine, Ser: 1.23 mg/dL (ref 0.4–1.5)
Creatinine, Ser: 1.28 mg/dL (ref 0.4–1.5)
Creatinine, Ser: 1.29 mg/dL (ref 0.4–1.5)
Creatinine, Ser: 1.3 mg/dL (ref 0.4–1.5)
Creatinine, Ser: 1.46 mg/dL (ref 0.4–1.5)
GFR calc Af Amer: 59 mL/min — ABNORMAL LOW (ref >60)
GFR calc Af Amer: >60 mL/min (ref >60)
GFR calc Af Amer: >60 mL/min (ref >60)
GFR calc Af Amer: >60 mL/min (ref >60)
GFR calc Af Amer: >60 mL/min (ref >60)
GFR calc Af Amer: >60 mL/min (ref >60)
GFR calc Af Amer: >60 mL/min (ref >60)
GFR calc Af Amer: >60 mL/min (ref >60)
GFR calc Af Amer: >60 mL/min (ref >60)
GFR calc non Af Amer: 49 mL/min — ABNORMAL LOW (ref >60)
GFR calc non Af Amer: 56 mL/min — ABNORMAL LOW (ref >60)
GFR calc non Af Amer: 56 mL/min — ABNORMAL LOW (ref >60)
GFR calc non Af Amer: 57 mL/min — ABNORMAL LOW (ref >60)
GFR calc non Af Amer: 59 mL/min — ABNORMAL LOW (ref >60)
GFR calc non Af Amer: 59 mL/min — ABNORMAL LOW (ref >60)
GFR calc non Af Amer: >60 mL/min (ref >60)
GFR calc non Af Amer: >60 mL/min (ref >60)
GFR calc non Af Amer: >60 mL/min (ref >60)
Glucose, Bld: 102 mg/dL — ABNORMAL HIGH (ref 70–99)
Glucose, Bld: 109 mg/dL — ABNORMAL HIGH (ref 70–99)
Glucose, Bld: 121 mg/dL — ABNORMAL HIGH (ref 70–99)
Glucose, Bld: 125 mg/dL — ABNORMAL HIGH (ref 70–99)
Glucose, Bld: 157 mg/dL — ABNORMAL HIGH (ref 70–99)
Glucose, Bld: 96 mg/dL (ref 70–99)
Glucose, Bld: 98 mg/dL (ref 70–99)
Glucose, Bld: 98 mg/dL (ref 70–99)
Glucose, Bld: 99 mg/dL (ref 70–99)
Potassium: 4 mEq/L (ref 3.5–5.1)
Potassium: 4.1 mEq/L (ref 3.5–5.1)
Potassium: 4.1 mEq/L (ref 3.5–5.1)
Potassium: 4.3 mEq/L (ref 3.5–5.1)
Potassium: 4.3 mEq/L (ref 3.5–5.1)
Potassium: 4.4 mEq/L (ref 3.5–5.1)
Potassium: 4.4 mEq/L (ref 3.5–5.1)
Potassium: 4.4 mEq/L (ref 3.5–5.1)
Potassium: 4.8 mEq/L (ref 3.5–5.1)
Sodium: 135 mEq/L (ref 135–145)
Sodium: 135 mEq/L (ref 135–145)
Sodium: 138 mEq/L (ref 135–145)
Sodium: 139 mEq/L (ref 135–145)
Sodium: 139 mEq/L (ref 135–145)
Sodium: 139 mEq/L (ref 135–145)
Sodium: 139 mEq/L (ref 135–145)
Sodium: 140 mEq/L (ref 135–145)
Sodium: 141 mEq/L (ref 135–145)

## 2011-01-24 LAB — DIFFERENTIAL
Basophils Absolute: 0 10*3/uL (ref 0.0–0.1)
Basophils Absolute: 0 10*3/uL (ref 0.0–0.1)
Basophils Relative: 0 % (ref 0–1)
Basophils Relative: 0 % (ref 0–1)
Eosinophils Absolute: 0.1 10*3/uL (ref 0.0–0.7)
Eosinophils Absolute: 0.2 10*3/uL (ref 0.0–0.7)
Eosinophils Relative: 2 % (ref 0–5)
Eosinophils Relative: 2 % (ref 0–5)
Lymphocytes Relative: 47 % — ABNORMAL HIGH (ref 12–46)
Lymphocytes Relative: 55 % — ABNORMAL HIGH (ref 12–46)
Lymphs Abs: 4 10*3/uL (ref 0.7–4.0)
Lymphs Abs: 4 10*3/uL (ref 0.7–4.0)
Monocytes Absolute: 0.6 10*3/uL (ref 0.1–1.0)
Monocytes Absolute: 0.8 10*3/uL (ref 0.1–1.0)
Monocytes Relative: 10 % (ref 3–12)
Monocytes Relative: 9 % (ref 3–12)
Neutro Abs: 2.4 10*3/uL (ref 1.7–7.7)
Neutro Abs: 3.5 10*3/uL (ref 1.7–7.7)
Neutrophils Relative %: 34 % — ABNORMAL LOW (ref 43–77)
Neutrophils Relative %: 41 % — ABNORMAL LOW (ref 43–77)

## 2011-01-24 LAB — HEPATIC FUNCTION PANEL
ALT: 75 U/L — ABNORMAL HIGH (ref 0–53)
AST: 44 U/L — ABNORMAL HIGH (ref 0–37)
Albumin: 3.7 g/dL (ref 3.5–5.2)
Alkaline Phosphatase: 88 U/L (ref 39–117)
Bilirubin, Direct: 0.3 mg/dL (ref 0.0–0.3)
Indirect Bilirubin: NEGATIVE mg/dL (ref 0.3–0.9)
Total Bilirubin: 0.2 mg/dL — ABNORMAL LOW (ref 0.3–1.2)
Total Protein: 6.7 g/dL (ref 6.0–8.3)

## 2011-01-24 LAB — POCT I-STAT, CHEM 8
BUN: 25 mg/dL — ABNORMAL HIGH (ref 6–23)
Calcium, Ion: 1.12 mmol/L (ref 1.12–1.32)
Chloride: 104 mEq/L (ref 96–112)
Creatinine, Ser: 1.4 mg/dL (ref 0.4–1.5)
Glucose, Bld: 95 mg/dL (ref 70–99)
HCT: 48 % (ref 39.0–52.0)
Hemoglobin: 16.3 g/dL (ref 13.0–17.0)
Potassium: 5.5 mEq/L — ABNORMAL HIGH (ref 3.5–5.1)
Sodium: 137 mEq/L (ref 135–145)
TCO2: 28 mmol/L (ref 0–100)

## 2011-01-24 LAB — URINALYSIS, ROUTINE W REFLEX MICROSCOPIC
Bilirubin Urine: NEGATIVE
Glucose, UA: NEGATIVE mg/dL
Ketones, ur: NEGATIVE mg/dL
Leukocytes, UA: NEGATIVE
Nitrite: NEGATIVE
Protein, ur: NEGATIVE mg/dL
Specific Gravity, Urine: 1.021 (ref 1.005–1.030)
Urobilinogen, UA: 0.2 mg/dL (ref 0.0–1.0)
pH: 6 (ref 5.0–8.0)

## 2011-01-24 LAB — LACTIC ACID, PLASMA: Lactic Acid, Venous: 1.7 mmol/L (ref 0.5–2.2)

## 2011-01-24 LAB — LIPID PANEL
Cholesterol: 226 mg/dL — ABNORMAL HIGH (ref 0–200)
HDL: 44 mg/dL (ref >39)
LDL Cholesterol: 161 mg/dL — ABNORMAL HIGH (ref 0–99)
Total CHOL/HDL Ratio: 5.1 RATIO
Triglycerides: 107 mg/dL (ref <150)
VLDL: 21 mg/dL (ref 0–40)

## 2011-01-24 LAB — HEPARIN LEVEL (UNFRACTIONATED)
Heparin Unfractionated: 0.45 IU/mL (ref 0.30–0.70)
Heparin Unfractionated: 0.48 IU/mL (ref 0.30–0.70)
Heparin Unfractionated: 0.5 IU/mL (ref 0.30–0.70)
Heparin Unfractionated: 0.54 IU/mL (ref 0.30–0.70)
Heparin Unfractionated: 0.56 IU/mL (ref 0.30–0.70)
Heparin Unfractionated: 0.56 IU/mL (ref 0.30–0.70)
Heparin Unfractionated: 0.58 IU/mL (ref 0.30–0.70)
Heparin Unfractionated: 0.71 IU/mL — ABNORMAL HIGH (ref 0.30–0.70)

## 2011-01-24 LAB — TSH: TSH: 1.915 u[IU]/mL (ref 0.350–4.500)

## 2011-01-24 LAB — PSA: PSA: 0 ng/mL — ABNORMAL LOW (ref 0.10–4.00)

## 2011-01-24 LAB — POCT CARDIAC MARKERS
CKMB, poc: <1 ng/mL — ABNORMAL LOW (ref 1.0–8.0)
Myoglobin, poc: 44.7 ng/mL (ref 12–200)
Troponin i, poc: <0.05 ng/mL (ref 0.00–0.09)

## 2011-01-24 LAB — TROPONIN I: Troponin I: 0.04 ng/mL (ref 0.00–0.06)

## 2011-01-24 LAB — HEPARIN INDUCED THROMBOCYTOPENIA PNL
Heparin Induced Plt Ab: NEGATIVE
Patient O.D.: 0.276
UFH High Dose UFH H: 0 % Release
UFH Low Dose 0.1 IU/mL: 5 % Release
UFH Low Dose 0.5 IU/mL: 5 % Release
UFH SRA Result: NEGATIVE

## 2011-01-24 LAB — HEMOCCULT GUIAC POC 1CARD (OFFICE): Fecal Occult Bld: NEGATIVE

## 2011-01-24 LAB — HOMOCYSTEINE: Homocysteine: 14.6 umol/L (ref 4.0–15.4)

## 2011-01-24 LAB — URINE MICROSCOPIC-ADD ON

## 2011-01-24 LAB — BRAIN NATRIURETIC PEPTIDE: Pro B Natriuretic peptide (BNP): <30 pg/mL (ref 0.0–100.0)

## 2011-01-24 LAB — PROTHROMBIN GENE MUTATION

## 2011-01-24 LAB — FACTOR 5 LEIDEN

## 2011-01-25 ENCOUNTER — Encounter (INDEPENDENT_AMBULATORY_CARE_PROVIDER_SITE_OTHER): Payer: Self-pay | Admitting: *Deleted

## 2011-01-28 LAB — POCT I-STAT 3, VENOUS BLOOD GAS (G3P V)
Acid-base deficit: 2 mmol/L (ref 0.0–2.0)
Bicarbonate: 24.7 mEq/L — ABNORMAL HIGH (ref 20.0–24.0)
O2 Saturation: 62 %
TCO2: 26 mmol/L (ref 0–100)
pCO2, Ven: 47 mmHg (ref 45.0–50.0)
pH, Ven: 7.328 — ABNORMAL HIGH (ref 7.250–7.300)
pO2, Ven: 35 mmHg (ref 30.0–45.0)

## 2011-01-28 LAB — BASIC METABOLIC PANEL
BUN: 23 mg/dL (ref 6–23)
CO2: 28 mEq/L (ref 19–32)
Calcium: 9.2 mg/dL (ref 8.4–10.5)
Chloride: 110 mEq/L (ref 96–112)
Creatinine, Ser: 1.14 mg/dL (ref 0.4–1.5)
GFR calc Af Amer: >60 mL/min (ref >60)
GFR calc non Af Amer: >60 mL/min (ref >60)
Glucose, Bld: 99 mg/dL (ref 70–99)
Potassium: 4.1 mEq/L (ref 3.5–5.1)
Sodium: 142 mEq/L (ref 135–145)

## 2011-01-28 LAB — PROTIME-INR
INR: 1.04 (ref 0.00–1.49)
Prothrombin Time: 13.5 seconds (ref 11.6–15.2)

## 2011-01-28 LAB — CBC
HCT: 40 % (ref 39.0–52.0)
Hemoglobin: 13.8 g/dL (ref 13.0–17.0)
MCHC: 34.6 g/dL (ref 30.0–36.0)
MCV: 93.8 fL (ref 78.0–100.0)
Platelets: 172 10*3/uL (ref 150–400)
RBC: 4.27 MIL/uL (ref 4.22–5.81)
RDW: 13.3 % (ref 11.5–15.5)
WBC: 6.7 10*3/uL (ref 4.0–10.5)

## 2011-01-29 NOTE — Letter (Signed)
Summary: Appointment - Reschedule  [employer], Main Office  [address]   [place], [place] [address]   Phone: [phone]  Fax: [phone]         January 25, 2011 MRN: [id number]       [facility] [address] [place], [place]  [address]    Dear Mr. Keo,    Due to a change in our office schedule, your appointment on January 30, 2011 at 11:45am must be changed.  It is very important that we reach you to reschedule this appointment. We look forward to participating in your health care needs. Please contact us at the number listed above at your earliest convenience to reschedule this appointment.      Sincerely,  Glass blower/designer

## 2011-01-30 ENCOUNTER — Encounter: Payer: Self-pay | Admitting: Cardiovascular Disease

## 2011-01-30 ENCOUNTER — Ambulatory Visit (INDEPENDENT_AMBULATORY_CARE_PROVIDER_SITE_OTHER): Payer: 59 | Admitting: Cardiovascular Disease

## 2011-01-30 ENCOUNTER — Encounter: Payer: Self-pay | Admitting: Cardiology

## 2011-01-30 ENCOUNTER — Ambulatory Visit: Payer: Self-pay | Admitting: Cardiovascular Disease

## 2011-01-30 VITALS — BP 137/85 | HR 70 | Ht 70.0 in | Wt 201.0 lb

## 2011-01-30 DIAGNOSIS — E785 Hyperlipidemia, unspecified: Secondary | ICD-10-CM

## 2011-01-30 DIAGNOSIS — I10 Essential (primary) hypertension: Secondary | ICD-10-CM

## 2011-01-30 DIAGNOSIS — I82409 Acute embolism and thrombosis of unspecified deep veins of unspecified lower extremity: Secondary | ICD-10-CM

## 2011-01-30 DIAGNOSIS — Z952 Presence of prosthetic heart valve: Secondary | ICD-10-CM

## 2011-01-30 DIAGNOSIS — M79609 Pain in unspecified limb: Secondary | ICD-10-CM

## 2011-01-30 DIAGNOSIS — J811 Chronic pulmonary edema: Secondary | ICD-10-CM

## 2011-01-30 DIAGNOSIS — I359 Nonrheumatic aortic valve disorder, unspecified: Secondary | ICD-10-CM

## 2011-01-30 DIAGNOSIS — M79606 Pain in leg, unspecified: Secondary | ICD-10-CM

## 2011-01-30 MED ORDER — GABAPENTIN 300 MG PO CAPS: 300.0000 mg | ORAL_CAPSULE | Freq: Two times a day (BID) | ORAL | Status: DC

## 2011-01-30 NOTE — Progress Notes (Signed)
[name] is seen for F/U of anticoagulation post AVR, history of PAF.  DVT with large bilateral PE 9/11.  F/U CT and LE [name] with resolving thrombus burden 09/18/10.  Fairly long period of subRx anticoagulation on 2 separate courses of Lovenox.  INR now Rx.  Still some dyspnea and fatigue.  No SSCP, palpitations, edema.  No bleeding problems.  Duplex done 01/18/11 showed  Some partial nonoccluding clot in SFA and proximal popliteal.

## 2011-01-30 NOTE — Assessment & Plan Note (Addendum)
Lab Results  Component Value Date   LDLCALC  Value: 161        Total Cholesterol/HDL:CHD Risk Coronary Heart Disease Risk Table                     Men   Women  1/2 Average Risk   3.4   3.3  Average Risk       5.0   4.4  2 X Average Risk   9.6   7.1  3 X Average Risk  23.4   11.0        Use the calculated Patient Ratio above and the CHD Risk Table to determine the patient's CHD Risk.        ATP III CLASSIFICATION (LDL):  <100     mg/dL   Optimal  034-742  mg/dL   Near or Above                    Optimal  130-159  mg/dL   Borderline  595-638  mg/dL   High  >756     mg/dL   Very High* 02/11/3294   Intolerant to statins.  Continue attempts at diet Rx

## 2011-01-30 NOTE — Patient Instructions (Signed)
Your physician recommends that you schedule a follow-up appointment in 6 months Your physician has requested that you have a lower or upper extremity venous duplex. This test is an ultrasound of the veins in the legs or arms. It looks at venous blood flow that carries blood from the heart to the legs or arms. Allow one hour for a Lower Venous exam. Allow thirty minutes for an Upper Venous exam. There are no restrictions or special instructions. START NEURONTIN 300mg  by mouth Two times a day

## 2011-01-30 NOTE — Assessment & Plan Note (Signed)
Repeat duplex in June.  Wants to fly to [place].  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

## 2011-01-30 NOTE — Assessment & Plan Note (Signed)
Well controlled Continue BB and low sodium diet

## 2011-01-30 NOTE — Assessment & Plan Note (Signed)
S/P AVR for severe AR Normal exam and no AR  F/U echo in a year.  Continue SBE prophylaxis

## 2011-01-31 ENCOUNTER — Ambulatory Visit: Payer: 59 | Admitting: Psychology

## 2011-02-06 ENCOUNTER — Ambulatory Visit (INDEPENDENT_AMBULATORY_CARE_PROVIDER_SITE_OTHER): Payer: 59 | Admitting: *Deleted

## 2011-02-06 DIAGNOSIS — I2699 Other pulmonary embolism without acute cor pulmonale: Secondary | ICD-10-CM

## 2011-02-06 DIAGNOSIS — I82409 Acute embolism and thrombosis of unspecified deep veins of unspecified lower extremity: Secondary | ICD-10-CM

## 2011-02-06 DIAGNOSIS — Z7901 Long term (current) use of anticoagulants: Secondary | ICD-10-CM | POA: Insufficient documentation

## 2011-02-06 DIAGNOSIS — I4891 Unspecified atrial fibrillation: Secondary | ICD-10-CM

## 2011-02-06 LAB — POCT INR: INR: 2.7

## 2011-02-06 NOTE — Patient Instructions (Signed)
INR 2.7 Continue taking 2 tablets (10mg ) daily, except take 3 tablets (15 mg) Mondays and Fridays. Recheck in 4 weeks.

## 2011-02-07 ENCOUNTER — Encounter: Payer: 59 | Admitting: *Deleted

## 2011-02-14 ENCOUNTER — Other Ambulatory Visit: Payer: Self-pay | Admitting: Family Medicine

## 2011-02-14 ENCOUNTER — Other Ambulatory Visit: Payer: 59

## 2011-02-14 ENCOUNTER — Telehealth: Payer: Self-pay | Admitting: *Deleted

## 2011-02-14 ENCOUNTER — Ambulatory Visit (INDEPENDENT_AMBULATORY_CARE_PROVIDER_SITE_OTHER): Payer: 59 | Admitting: Family Medicine

## 2011-02-14 ENCOUNTER — Ambulatory Visit (INDEPENDENT_AMBULATORY_CARE_PROVIDER_SITE_OTHER): Payer: 59 | Admitting: Psychology

## 2011-02-14 ENCOUNTER — Encounter: Payer: Self-pay | Admitting: Family Medicine

## 2011-02-14 VITALS — BP 124/88 | Temp 98.6°F

## 2011-02-14 DIAGNOSIS — F4321 Adjustment disorder with depressed mood: Secondary | ICD-10-CM

## 2011-02-14 DIAGNOSIS — R1031 Right lower quadrant pain: Secondary | ICD-10-CM

## 2011-02-14 LAB — CBC WITH DIFFERENTIAL/PLATELET
Basophils Absolute: 0 10*3/uL (ref 0.0–0.1)
Basophils Relative: 0 % (ref 0–1)
Eosinophils Absolute: 0.2 10*3/uL (ref 0.0–0.7)
Eosinophils Relative: 3 % (ref 0–5)
HCT: 43.5 % (ref 39.0–52.0)
Hemoglobin: 14.9 g/dL (ref 13.0–17.0)
Lymphocytes Relative: 69 % — ABNORMAL HIGH (ref 12–46)
Lymphs Abs: 4.6 10*3/uL — ABNORMAL HIGH (ref 0.7–4.0)
MCH: 30.8 pg (ref 26.0–34.0)
MCHC: 34.3 g/dL (ref 30.0–36.0)
MCV: 89.9 fL (ref 78.0–100.0)
Monocytes Absolute: 0.5 10*3/uL (ref 0.1–1.0)
Monocytes Relative: 7 % (ref 3–12)
Neutro Abs: 1.4 10*3/uL — ABNORMAL LOW (ref 1.7–7.7)
Neutrophils Relative %: 20 % — ABNORMAL LOW (ref 43–77)
Platelets: 189 10*3/uL (ref 150–400)
RBC: 4.84 MIL/uL (ref 4.22–5.81)
RDW: 13.8 % (ref 11.5–15.5)
WBC: 6.7 10*3/uL (ref 4.0–10.5)

## 2011-02-14 LAB — POCT URINALYSIS DIPSTICK
Bilirubin, UA: NEGATIVE
Glucose, UA: NEGATIVE
Ketones, UA: NEGATIVE
Leukocytes, UA: NEGATIVE
Nitrite, UA: NEGATIVE
Protein, UA: NEGATIVE
Spec Grav, UA: 1.02
Urobilinogen, UA: 0.2
pH, UA: 0.5

## 2011-02-14 NOTE — Progress Notes (Signed)
  Subjective:    Patient ID: [name], male    DOB: 09-Aug-1946, 65 y.o.   MRN: [id number]  HPI Patient seen with acute issue of sharp pain right lower quadrant which started this morning. Pain has actually eased at this time. Pain was right lower quadrant without radiation. Some urine frequency but no burning with urination. No fever or chills. No appetite change, nausea, or vomiting. No gross hematuria. No stool changes. Pain possibly exacerbated by movement such as lying supine.  No pain with ambulation at this time.  He has chronic problems including history of atrial fibrillation, hypertension, hyperlipidemia, and past history of prostate cancer.   Review of Systems  Respiratory: Negative for cough and shortness of breath.   Cardiovascular: Negative for chest pain and palpitations.  Gastrointestinal: Positive for abdominal pain. Negative for nausea, vomiting, diarrhea, constipation and blood in stool.  Genitourinary: Negative for dysuria, hematuria and testicular pain.       Objective:   Physical Exam  Constitutional: He appears well-developed and well-nourished.  HENT:  Head: Normocephalic and atraumatic.  Cardiovascular: Normal rate, regular rhythm and normal heart sounds.   Pulmonary/Chest: Effort normal and breath sounds normal. He has no wheezes. He has no rales.  Abdominal: Soft. Bowel sounds are normal. He exhibits no distension and no mass. There is no rebound and no guarding.       Patient has tenderness right lower quadrant to deep palpation. No masses noted. No guarding.  Skin: No rash noted.  Psychiatric: He has a normal mood and affect.          Assessment & Plan:  Abdominal pain right lower quadrant. Reassuring is the fact that he has no fever, normal appetite, and decreased pain compared to this morning. He is tender though over the right lower quadrant. Check urinalysis along with a stat CBC. Consider CT abdomen and pelvis if he has any fever, recurrent or  worsening pain, nausea, or vomiting or if white blood count slightly elevated  Urine dipstick does show 2+ blood but no pyuria. This raises question of possible kidney stone given acute onset. Patient to drink plenty of fluids. He is essentially pain-free at this time. Consider KUB or other imaging of the urinary tract if pain recurs

## 2011-02-14 NOTE — Telephone Encounter (Signed)
Pt. Would like lab results

## 2011-02-14 NOTE — Patient Instructions (Signed)
Follow up promptly for any fever, nausea/vomiting, or worsening abdominal pain.

## 2011-02-15 ENCOUNTER — Encounter: Payer: Self-pay | Admitting: Family Medicine

## 2011-02-15 ENCOUNTER — Other Ambulatory Visit: Payer: Self-pay | Admitting: Cardiovascular Disease

## 2011-02-15 NOTE — Telephone Encounter (Signed)
I spoke with patient 02-14-11 and no fever, nausea, anorexia.  Pain somewhat better.  WBC normal.  ?if he passed small kidney stone.  He is advised to follow up for repeat UA within 1-2 weeks EVEN IF TOTALLY ASYMPTOMATIC to make sure hematuria does not persist.

## 2011-02-19 ENCOUNTER — Telehealth: Payer: Self-pay | Admitting: Cardiovascular Disease

## 2011-02-19 NOTE — Telephone Encounter (Signed)
Pt signed ROI,gave records to him while he was here. 02/19/11/KM

## 2011-02-21 ENCOUNTER — Encounter: Payer: Self-pay | Admitting: Family Medicine

## 2011-02-28 ENCOUNTER — Ambulatory Visit: Payer: 59 | Admitting: Psychology

## 2011-03-04 ENCOUNTER — Ambulatory Visit (INDEPENDENT_AMBULATORY_CARE_PROVIDER_SITE_OTHER): Payer: 59 | Admitting: *Deleted

## 2011-03-04 DIAGNOSIS — I82409 Acute embolism and thrombosis of unspecified deep veins of unspecified lower extremity: Secondary | ICD-10-CM

## 2011-03-04 DIAGNOSIS — I2699 Other pulmonary embolism without acute cor pulmonale: Secondary | ICD-10-CM

## 2011-03-04 DIAGNOSIS — I4891 Unspecified atrial fibrillation: Secondary | ICD-10-CM

## 2011-03-04 LAB — POCT INR: INR: 2.5

## 2011-03-06 ENCOUNTER — Encounter: Payer: 59 | Admitting: *Deleted

## 2011-03-14 ENCOUNTER — Ambulatory Visit (INDEPENDENT_AMBULATORY_CARE_PROVIDER_SITE_OTHER): Payer: 59 | Admitting: Psychology

## 2011-03-14 DIAGNOSIS — F4321 Adjustment disorder with depressed mood: Secondary | ICD-10-CM

## 2011-03-15 ENCOUNTER — Encounter: Payer: Self-pay | Admitting: Internal Medicine

## 2011-03-15 ENCOUNTER — Ambulatory Visit (INDEPENDENT_AMBULATORY_CARE_PROVIDER_SITE_OTHER): Payer: 59 | Admitting: Internal Medicine

## 2011-03-15 VITALS — BP 116/82 | HR 80 | Temp 98.4°F | Ht 70.0 in | Wt 205.0 lb

## 2011-03-15 DIAGNOSIS — Z23 Encounter for immunization: Secondary | ICD-10-CM

## 2011-03-15 DIAGNOSIS — F411 Generalized anxiety disorder: Secondary | ICD-10-CM

## 2011-03-15 MED ORDER — CITALOPRAM HYDROBROMIDE 40 MG PO TABS: 20.0000 mg | ORAL_TABLET | Freq: Every day | ORAL | Status: DC

## 2011-03-15 NOTE — Progress Notes (Signed)
  Subjective:    Patient ID: [name], male    DOB: 02-22-1946, 65 y.o.   MRN: [id number]  HPI Profound nightmares/night terrors---injury to forehead---he thinks related to trazodone.  Complains of a crawling sensation both legs. --treated with neurontin by dr nishan---no relief of sxs.  Mood---well controlled on citalopram  Sleep--fair with trazodone  Past Medical History  Diagnosis Date  . Hypertension   . Hyperlipidemia   . Atrial fibrillation   . Mitral regurgitation   . Hypercholesteremia   . Insomnia   . Anxiety   . Aortic regurgitation   . History of prostate cancer   . Anticoagulants causing adverse effect in therapeutic use    Past Surgical History  Procedure Date  . Cardiac catheterization   . Penile prosthesis implant   . Prostate surgery   . Cataract extraction     L 12/06/09   R 09/14/09  . Aortic valve replacement 2011    [facility]     reports that he quit smoking about 30 years ago. His smoking use included Cigarettes. He smoked 1 pack per day. He has never used smokeless tobacco. He reports that he does not drink alcohol or use illicit drugs. family history includes Cancer in his sister and unspecified family member; Hyperlipidemia in his other; and Hypertension in his other. Allergies  Allergen Reactions  . Aspirin     Can't take high dose  . Atorvastatin     REACTION: myalgia  . Buspirone Hcl     REACTION: headache  . Codeine Phosphate     REACTION: upset stomach, itching  . Hydrocodone-Acetaminophen   . Morphine Sulfate     REACTION: itching  . Oxycodone Hcl   . Rabeprazole     REACTION: headache  . Ramipril     REACTION: unspecified  . Telmisartan-Hctz     REACTION: sob,dizzy,palpitations      Review of Systems  patient denies chest pain, shortness of breath, orthopnea. Denies lower extremity edema, abdominal pain, change in appetite, change in bowel movements. Patient denies rashes, musculoskeletal complaints. No other specific  complaints in a complete review of systems.      Objective:   Physical Exam  well-developed well-nourished male in no acute distress. HEENT exam atraumatic, normocephalic, neck supple without jugular venous distention. Chest clear to auscultation cardiac exam S1-S2 are regular. Abdominal exam overweight with bowel sounds, soft and nontender. Extremities no edema. Neurologic exam is alert with a normal gait.        Assessment & Plan:

## 2011-03-22 ENCOUNTER — Other Ambulatory Visit: Payer: 59

## 2011-03-22 ENCOUNTER — Other Ambulatory Visit (INDEPENDENT_AMBULATORY_CARE_PROVIDER_SITE_OTHER): Payer: 59 | Admitting: Internal Medicine

## 2011-03-22 ENCOUNTER — Telehealth: Payer: Self-pay | Admitting: *Deleted

## 2011-03-22 DIAGNOSIS — D72829 Elevated white blood cell count, unspecified: Secondary | ICD-10-CM

## 2011-03-22 DIAGNOSIS — Z13220 Encounter for screening for lipoid disorders: Secondary | ICD-10-CM

## 2011-03-22 DIAGNOSIS — Z0000 Encounter for general adult medical examination without abnormal findings: Secondary | ICD-10-CM

## 2011-03-22 LAB — BASIC METABOLIC PANEL
BUN: 15 mg/dL (ref 6–23)
CO2: 27 mEq/L (ref 19–32)
Calcium: 9.2 mg/dL (ref 8.4–10.5)
Chloride: 105 mEq/L (ref 96–112)
Creatinine, Ser: 1.2 mg/dL (ref 0.4–1.5)
GFR: 78.12 mL/min (ref >60.00)
Glucose, Bld: 96 mg/dL (ref 70–99)
Potassium: 4.4 mEq/L (ref 3.5–5.1)
Sodium: 140 mEq/L (ref 135–145)

## 2011-03-22 LAB — HEPATIC FUNCTION PANEL
ALT: 28 U/L (ref 0–53)
AST: 20 U/L (ref 0–37)
Albumin: 3.9 g/dL (ref 3.5–5.2)
Alkaline Phosphatase: 52 U/L (ref 39–117)
Bilirubin, Direct: 0.1 mg/dL (ref 0.0–0.3)
Total Bilirubin: 0.5 mg/dL (ref 0.3–1.2)
Total Protein: 6.5 g/dL (ref 6.0–8.3)

## 2011-03-22 LAB — POCT URINALYSIS DIPSTICK
Bilirubin, UA: NEGATIVE
Glucose, UA: NEGATIVE
Ketones, UA: NEGATIVE
Leukocytes, UA: NEGATIVE
Nitrite, UA: NEGATIVE
Protein, UA: NEGATIVE
Spec Grav, UA: 1.02
Urobilinogen, UA: 0.2
pH, UA: 5.5

## 2011-03-22 LAB — CBC WITH DIFFERENTIAL/PLATELET
Basophils Absolute: 0 10*3/uL (ref 0.0–0.1)
Basophils Relative: 0.5 % (ref 0.0–3.0)
Eosinophils Absolute: 0.1 10*3/uL (ref 0.0–0.7)
Eosinophils Relative: 2.3 % (ref 0.0–5.0)
HCT: 43.2 % (ref 39.0–52.0)
Hemoglobin: 14.9 g/dL (ref 13.0–17.0)
Lymphocytes Relative: 65 % — ABNORMAL HIGH (ref 12.0–46.0)
Lymphs Abs: 3.8 10*3/uL (ref 0.7–4.0)
MCHC: 34.5 g/dL (ref 30.0–36.0)
MCV: 91.8 fl (ref 78.0–100.0)
Monocytes Absolute: 0.5 10*3/uL (ref 0.1–1.0)
Monocytes Relative: 8.5 % (ref 3.0–12.0)
Neutro Abs: 1.4 10*3/uL (ref 1.4–7.7)
Neutrophils Relative %: 23.7 % — ABNORMAL LOW (ref 43.0–77.0)
Platelets: 163 10*3/uL (ref 150.0–400.0)
RBC: 4.71 Mil/uL (ref 4.22–5.81)
RDW: 13.6 % (ref 11.5–14.6)
WBC: 5.8 10*3/uL (ref 4.5–10.5)

## 2011-03-22 LAB — LDL CHOLESTEROL, DIRECT: Direct LDL: 189.8 mg/dL

## 2011-03-22 LAB — LIPID PANEL
Cholesterol: 259 mg/dL — ABNORMAL HIGH (ref 0–200)
HDL: 44.8 mg/dL (ref >39.00)
Total CHOL/HDL Ratio: 6
Triglycerides: 109 mg/dL (ref 0.0–149.0)
VLDL: 21.8 mg/dL (ref 0.0–40.0)

## 2011-03-22 LAB — PSA: PSA: 0 ng/mL — ABNORMAL LOW (ref 0.10–4.00)

## 2011-03-22 LAB — TSH: TSH: 1.03 u[IU]/mL (ref 0.35–5.50)

## 2011-03-22 MED ORDER — VENLAFAXINE HCL ER 75 MG PO CP24: 75.0000 mg | ORAL_CAPSULE | Freq: Every day | ORAL | Status: DC

## 2011-03-22 NOTE — Assessment & Plan Note (Signed)
Long discussion with patient i suspect night mares caused by citalopram Try to decrease dose  Try to limit benzo  If sxs worsen with medication adjustements he may need to switch citalopram to a different medication

## 2011-03-22 NOTE — Telephone Encounter (Signed)
Notified pt of Dr. Marliss Coots recommendations, and prescription sent in.

## 2011-03-22 NOTE — Telephone Encounter (Signed)
i suspect citalopram cause of nightmares Decrease citalopram to 20mg  1/2 tablet every other day for 8 days and then stop.  Start (now) venlafaxine xr 75mg  po qd #30/3 refills

## 2011-03-22 NOTE — Telephone Encounter (Signed)
Update: after cutting back on Citalopram  More anxious and continues with nightmares.

## 2011-03-25 LAB — PATHOLOGIST SMEAR REVIEW

## 2011-03-26 NOTE — Assessment & Plan Note (Signed)
[facility]                            CARDIOLOGY OFFICE NOTE   NAME:Helling, York Pellant                          MRN:          [id number]  DATE:10/13/2007                            DOB:          1946/05/28    HISTORY OF PRESENT ILLNESS:  [name] returns today for follow up.  He has  significant aortic insufficiency which we have been following.  His last  echo was done June 11, 2007.  His end diastolic dimension was 68 mm and  his end systolic dimension was 45 mm.  Overall, EF was 55%.  At times,  his aortic insufficiency is difficult to quantitate as it is eccentric.   Visually, it appeared moderate but with such a severe pressure at half  time.  It was graded by Dr.  Gala Romney.  It is possibly worse.  He had a  mean gradient across the valve of 7 mmHg.   Overall, this was thought to be a stable pattern.  He is asymptomatic.  He does have significant anxiety attacks.   The patient sees Dr. Dellia Cloud.  He has been working through this.  I  had him on a low-dose beta blocker which was stopped by Dr. Cato Mulligan  recently.  He does have nightmares and difficult sleeping with it.  However, I told him he should take it when he has an anxiety attack  along with the Xanax to help speed the recovery of his mood.  He also is  on Crestor for hyperlipidemia.  He has had myalgias in the past with  some statins, but seems to be tolerating Crestor at 5 mg a day.   REVIEW OF SYSTEMS:  Otherwise, negative.   CURRENT MEDICATIONS:  1. Cozaar 100 mg daily.  2. Crestor 5 mg daily.  3. Baby aspirin daily.   PHYSICAL EXAMINATION:  GENERAL:  A healthy-appearing, middle-aged, black  male in no distress.  VITAL SIGNS:  Weight 209, blood pressure 123/64, pulse pressure is not  particularly widened.  Pulse is 71 and regular, respirations 12,  afebrile.  HEENT:  Unremarkable.  Carotids are bounding but no particularly bifid  or bisferiens.  There is no murmur.  No JVP elevation.   No  lymphadenopathy or thyromegaly.  LUNGS:  Clear with good diaphragmatic motion.  No wheezing.  HEART:  S1, S2 with a not particularly short systolic murmur of aortic  insufficiency and asystolic ejection murmur with aortic valve.  PMI is  increased but not displaced.  .  ABDOMEN:  Benign.  No AAA.  No hepatosplenomegaly.  No hepatojugular  reflux.  Bowel sounds are positive.  No tenderness.  Femorals are  prominent but not necessarily pistol shots.  EXTREMITIES:  Distal pulses are intact.  NEUROLOGICAL:  Nonfocal.  SKIN:  Warm and dry.   IMPRESSION:  1. Significant aortic insufficiency, currently asymptomatic.  LV      dimension is okay.  Continue outflow reduction with Cozaar.  Follow      up echo in six months.  His dentition is in good shape.  He is  seeing a new dentist.  2. Hypertension.  Currently well controlled.  Continue low salt diet      as well as Cozaar.  3. Hypercholesterolemia.  Continue Crestor 5 mg daily.  Lipid and      liver profile in six months.  4. Anxiety, depression.  Follow with Dr. Dellia Cloud.  Continue p.r.n.      Lopresor and Xanax.  Apparently, he has been on BuSpar in the past,      but this made his mood worse.  As long as I have known Timarion, he      tends to react paradoxically to some medications.  He will talk      further about this with Dr. Cato Mulligan and Dr. Dellia Cloud.  I told him      that in regards to his heart, that these anxiety attacks certainly      are not doing any permanent damage, and he seemed to look relieved      by that.  I will see him back in six months when he has his echo.     Noralyn Pick. Eden Emms, MD, [facility]  Electronically Signed    PCN/MedQ  DD: 10/13/2007  DT: 10/14/2007  Job #: [id number]

## 2011-03-26 NOTE — H&P (Signed)
[name], [name] NO.:  [id number]   MEDICAL RECORD NO.:  [id number]          PATIENT TYPE:  INP   LOCATION:  3735                         FACILITY:  [facility]   PHYSICIAN:  Rollene Rotunda, MD, FACCDATE OF BIRTH:  [birth year]   DATE OF ADMISSION:  11/01/2007  DATE OF DISCHARGE:                              HISTORY & PHYSICAL   PRIMARY CARE PHYSICIAN:  Dr. Oliver Barre.   CARDIOLOGIST:  Dr. Maurine Cane.   REASON FOR PRESENTATION:  Fatigue.   HISTORY OF PRESENT ILLNESS:  The patient is a 65 year old gentleman  followed by Dr. Eden Emms for aortic insufficiency.  It was felt to be  moderately severe.  He is being followed clinically for this, and it is  not felt that he yet has met criteria for valve replacement.  However,  since seeing Dr. Eden Emms in early December, he has progressive fatigue.  Says he is now getting tired with minimal tired with minimal activities.  He does not want to do his tai chi anymore.  He does not want to do his  cooking.  He says he walks across the room and he is fatigued.  He does  not describe dyspnea.  He is not having any PND or orthopnea.  He has  had no swelling or weight gain.  He has had no chest discomfort, neck or  arm discomfort.  He was treated for a bronchitis about a week ago, and  took a course of antibiotics.  However, he is not having any fevers,  chills cough.  He has had no joints or rash.  However, because of the  progressive and now debilitating fatigue, he presented to the emergency  room.   PAST MEDICAL HISTORY:  1. Aortic insufficiency (moderately severe, with the last echo being      June 11, 2007.  The end-diastolic dimension was 68 mm and the end-      systolic 45.  The EF is 55%).  2. Anxiety.  3. Hypertension.  4. Paroxysmal atrial fibrillation in the past.  5. Hyperlipidemia.  6. Prostate cancer.  7. Erectile dysfunction.   PAST SURGICAL HISTORY:  1. Prostate surgery.  2. Penile implant.   ALLERGIES/INTOLERANCES:  1. ASPIRIN.  2. CODEINE.  3. MORPHINE.  4. VICODIN.   MEDICATIONS:  1. Lorazepam p.r.n.  2. Micardis HCT 80/25 daily.  3. Crestor 10 mg daily.   SOCIAL HISTORY:  He lives alone.  He works for [employer].  He quit  smoking 30 years ago after a 30 pack/year history.   FAMILY HISTORY:  Noncontributory.   REVIEW OF SYSTEMS:  Positive for occasional headaches.  Negative for  other systems.   PHYSICAL EXAMINATION:  The patient is in no acute distress.  Blood pressure 149/75, heart rate 99 and regular, respiratory rate 16,  afebrile.  HEENT:  Eyes unremarkable.  Pupils equal, round and react to light.  Fundi not visualized.  Oral mucosa unremarkable.  NECK:  No jugular venous distention, 45 degrees.  Carotid upstroke  bounding.  No bruits.  No thyromegaly.  LYMPHATICS:  No cervical, axillary, inguinal adenopathy.  LUNGS:  Clear to auscultation bilaterally.  BACK:  No costovertebral angle tenderness.  CHEST:  His PMI is not displaced or sustained, is otherwise  unremarkable.  HEART:  S1 and S2 within normal limits.  No S3, no S4.  3/6 diastolic  murmur heard best at the third left intercostal space, but appreciated  over the precordium.  No [place] murmur appreciated.  No systolic  murmurs.  ABDOMEN:  Flat.  Positive bowel sounds.  No frequency and pitch.  No  bruits, rebound, guarding.  No hepatomegaly, splenomegaly.  SKIN:  No rashes.  EXTREMITIES:  2+ bounding pulses throughout.  No edema.  NEURO:  Oriented to person, place and time.  Cranial nerves II-XII  grossly intact.  Motor grossly intact throughout.   LABORATORY DATA:  WBC 7.2, hemoglobin 14.7, platelets 261, sodium 137,  potassium 3.9, BUN 25.  Cardiac enzymes negative.   EKG:  Sinus rhythm, rate 81.  Axis within normal limits.  Intervals  within normal limits.  Early transition lead V2.  Nonspecific inferior T-  wave changes.   ASSESSMENT AND PLAN:  1. Fatigue:  The patient is  admitted because of progressive fatigue      that has now become debilitating, per his report.  He does not      appear to have any overt failure.  He has obvious aortic      insufficiency.  There is no evidence of endocarditis by physical or      history.  At this point, the plan to rule out myocardial      infarction, check BMP and chemistries, and repeat an      echocardiogram.  2. Hypertension:  He will continue his outpatient regimen.  3. Anxiety/depression:  He will continue with p.r.n. anxiolytics.      Rollene Rotunda, MD, [facility]  Electronically Signed     JH/MEDQ  D:  11/02/2007  T:  11/02/2007  Job:  [id number]

## 2011-03-26 NOTE — Assessment & Plan Note (Signed)
[facility]                            CARDIOLOGY OFFICE NOTE   NAME:[name], [name]                       MRN:          [id number]  DATE:12/15/2007                            DOB:          09/02/1946    [name] returns today for followup.  He is followed by Dr. Cato Mulligan and  myself for hypertension and aortic insufficiency.   [name] is doing fairly well.  However, he feels more fatigued.  He feels  like his blood pressure is causing him problems.  His lisinopril dose is  being titrated by Dr. Cato Mulligan. He is currently taking 25 mg a day. [name]  tends to be intolerant of medicines and needs to be careful when he  starts medication.  The Lipitor that we tried to start him on as well as  his Crestor caused myalgias and tingling in his hands.   His aortic insufficiency is significant. It has been severe by  catheterization.  His last echo in December showed at least moderate  aortic insufficiency.  His LV internal dimension in diastole was 64.6  mm.  LV internal dimension in systole was 41 mm.   He did have holosystolic reversals in the ascending aorta   [name] is active doing Tai Chi, however, the fact that he just does not  feel well makes me want to get a more objective idea of his exercise  tolerance. I talked to him about doing a cardiopulmonary stress test and  I think this is a good idea.   In regards to his valve, his dentition has been in good shape.  There  has been no evidence of SBE.  His LV dimensions have been stable.  There  has been no palpitations, PND, orthopnea, no syncope.   His review of systems otherwise negative.   His blood pressure today shows a wide pulse pressure of 140/50, pulse 74  and regular, weight is 207, respiratory 14.  HEENT:  Unremarkable.  Carotids are brisk and almost bisferiens.  There  is no bruit, no lymphadenopathy, thyromegaly or JVP elevation.  LUNGS:  Clear with good diaphragmatic motion.  No wheezing.  CARDIOVASCULAR:  There is an S1-S2.  He does have a flow murmur through  the valve in systole as well as a diastolic murmur that is mid peaking  ABDOMEN:  Benign.  Aorta is pulsatile.  EXTREMITIES:  He does have somewhat distal shot femorals.  Distal pulse  intact, no edema NEURO:  Nonfocal.  SKIN:  Warm and dry.   EKG shows LVH.   IMPRESSION:  1. Hypertension.  Continue to follow with Dr. Cato Mulligan, low cholesterol      diet, titrate lisinopril as tolerated.  2. Aortic insufficiency.  Follow-up echo in July.  Continue to monitor      LV cavity size. Cardiopulmonary stress test to document normal      activity and no limitations in regards VO2 middle-aged.  3. Hypercholesterolemia.  The patient intolerant to statins.  Follow-      up lipid and liver profile in 6 months.  I would not push the idea  of medication with [name] since he tends to be intolerant of a lot      of then and there is no documented coronary disease.     Noralyn Pick. Eden Emms, MD, [facility]  Electronically Signed    PCN/MedQ  DD: 12/15/2007  DT: 12/15/2007  Job #: [id number]   cc:   Valetta Mole. Swords, MD

## 2011-03-26 NOTE — Discharge Summary (Signed)
NAMEALIEU, [name] NO.:  [id number]   MEDICAL RECORD NO.:  [id number]          PATIENT TYPE:  INP   LOCATION:  3735                         FACILITY:  [facility]   PHYSICIAN:  Rollene Rotunda, MD, FACCDATE OF BIRTH:  1946/06/28   DATE OF ADMISSION:  11/01/2007  DATE OF DISCHARGE:                         DISCHARGE SUMMARY - REFERRING   PRIMARY CARE PHYSICIAN:  Dr. Jonny Ruiz   PRIMARY CARDIOLOGIST:  Dr. Eden Emms   DISCHARGE DIAGNOSES:  1. Fatigue of uncertain etiology.  2. Known history of moderate severe atrial insufficiency.  3. History is noted per past medical history.   SUMMARY OF HISTORY:  [name] is a 65 year old male usually followed by  Dr. Eden Emms for aortic insufficiency which is felt to be moderately  severe but has not met criteria for valve replacement.  Since seeing Dr.  Eden Emms in early December he has had progressive fatigue that he  describes with minimal activities.  The patient does not want to do his  Tai Chi, cooking, and he states that just walking across the room makes  him incredibly fatigued.  He denies dyspnea, orthopnea or PND.  He  denies any swelling or weight gain.  He was treated recently for  bronchitis with antibiotics; however, he denies any fevers.   Past medical history is also notable for anxiety, hypertension, PAF,  hyperlipidemia, prostate cancer and erectile dysfunction.   LABORATORY:  Chest x-ray on December 22 showed chronic lung changes, no  acute pulmonary findings.  EKGs showed normal sinus rhythm with a  ventricular rate of 80, interventricular conduction delay.  Admission  H&H was 14.7 and 42.8, normal indices, platelets 261, WBC 7.2.  D-dimer  less than 0.22.  Sodium 134, potassium 3.8, BUN 21, creatinine 1.32.  Normal LFTs.  CK-MB, relative index were within normal limits.  Troponin  was also within normal limits.  BNP less than 38.  Urinalysis showed 15  mg/dL of ketones.   HOSPITAL COURSE:  The patient was admitted  overnight for observation.  On December 22 Dr. Antoine Poche also saw the patient and the patient stated  that he rested well and felt less fatigue.  Again, he denied any chest  discomfort or shortness of breath.  Dr. Antoine Poche felt that the patient  could be discharged home with outpatient followup.   DISPOSITION:  The patient is discharged home.  He is asked to continue  his home medications which include:  1. Lorazepam p.r.n.  2. Micardis HCT 80/25 daily.  3. Crestor 10 mg daily.   He will have an echocardiogram today at our office at 2:45 p.m. and  follow up with Dr. Eden Emms on November 30, 2006, at 3:15 p.m.   ACTIVITIES:  Not restricted.   WOUND CARE:  Not applicable.   Discharge time 25 minutes.      Joellyn Rued, PA-C      Rollene Rotunda, MD, [facility]  Electronically Signed    EW/MEDQ  D:  11/02/2007  T:  11/02/2007  Job:  [id number]   cc:   Corwin Levins, MD  [name], MD, [facility]

## 2011-03-26 NOTE — Consult Note (Signed)
NEW PATIENT CONSULTATION   [name], [name]  DOB:  July 28, 1946                                        April 30, 2010  CHART #:  [id number]   PRIMARY CARE PHYSICIAN:  Valetta Mole. Swords, MD   REASON FOR CONSULTATION:  Severe aortic insufficiency.   HISTORY OF PRESENT ILLNESS:  The patient is a 65 year old gentleman from  [place] with longstanding history of heart murmur originally picked  up during childhood.  More recently, he has been diagnosed with aortic  insufficiency, and he has been followed regularly by Dr. Charlton Haws  for several years.  On recent followup visit, he was noted to have a  much more prominent murmur on physical exam and repeat transthoracic  echocardiogram performed April 18, 2010, demonstrated the presence of  severe aortic insufficiency with severe prolapse of one of the cusps of  the aortic valve.  The aortic regurgitant jet was noted to involve 60%  of the left ventricular outflow tract diameter and there was  holodiastolic flow reversal in the descending thoracic aorta with a  regurgitant volume estimated 60 mL.  The patient has moderate left  ventricular chamber enlargement with essentially normal left ventricular  systolic function.  Ejection fraction is estimated 50-55%.  The patient  subsequently underwent transesophageal echocardiogram and left and right  heart catheterization by Dr. Eden Emms on April 23, 2010.  Transesophageal  echocardiogram confirmed the presence of severe prolapse of the  noncoronary cusp of the aortic valve with possibility of leaflet  perforation and severe aortic insufficiency.  There was moderate left  ventricular chamber enlargement with ejection fraction estimated 50-55%  and trivial mitral regurgitation.  No other significant abnormalities  were noted.  Cardiac catheterization demonstrated the presence of normal  coronary artery anatomy with left dominant coronary circulation and no  significant coronary  artery disease.  Aortography confirmed the presence  of normal-sized aortic root and severe aortic insufficiency.  The  abdominal aorta and iliac vessels were normal and free of significant  aortoiliac disease.  Right heart catheterization demonstrated pulmonary  artery pressures of 20/7 and central venous pressure of 2.  No other  significant abnormalities were noted.  The patient has been referred to  consider surgical intervention.   REVIEW OF SYSTEMS:  GENERAL:  The patient reports normal appetite.  He  has not been gaining nor losing weight recently.  He is physically  active.  He is 5 feet 10 inches and weighs approximately 200 pounds.  CARDIAC:  The patient denies any history of chest pain, chest tightness,  or chest pressure either with activity or at rest.  He does admit to  mild exertional shortness of breath and fatigue that he has attributed  to the aging process.  This does not slow him down much and he is quite  active physically.  He has occasional tachy palpitations.  He has had  occasional mild dizzy spells.  He has had episodes of paroxysmal atrial  fibrillation on 2 occasions approximately 2 years ago, both of which  terminated spontaneously on medical therapy.  The patient denies PND,  orthopnea, or lower extremity edema.  RESPIRATORY:  Negative.  The patient denies productive cough,  hemoptysis, or wheezing.  GASTROINTESTINAL:  Notable for some symptoms of reflux diagnosed 4 or 5  months ago.  This has been  improved on medical therapy.  The patient has  no difficulty swallowing.  He reports normal bowel function.  He denies  hematochezia, hematemesis, or melena.  GENITOURINARY:  Negative.  The patient does have a functioning  prosthesis, status post radical prostatectomy 6 or 7 years ago for  prostate cancer.  PERIPHERAL VASCULAR:  Negative.  NEUROLOGIC:  Notable only for history of mild intermittent dizzy spells  without syncope.  PSYCHIATRIC:  Notable for some  history of chronic anxiety.  HEENT:  Negative.  The patient has had recent improvement in his visual  acuity following bilateral cataract extraction and right eye surgery for  macular degeneration.   PAST MEDICAL HISTORY:  1. Aortic insufficiency.  2. Hypertension.  3. Hyperlipidemia.  4. Prostate cancer.  5. Paroxysmal atrial fibrillation.  6. Macular degeneration.  7. Cataracts.   PAST SURGICAL HISTORY:  1. Radical prostatectomy.  2. Penile implant.  3. Bilateral rotator cuff repair.  4. Bilateral cataract extraction.  5. Right eye surgery for macular degeneration.   SOCIAL HISTORY:  The patient is single and lives alone in the [place]  area for the past 12 years.  He works in the Music therapist for [employer].  He has a previous history of tobacco abuse, but he quit  smoking 25 years ago.  He denies excessive alcohol consumption.  He  lives an active physical lifestyle and enjoys teaching and performing  tai chi.   CURRENT MEDICATIONS:  1. Lisinopril 20 mg daily.  2. Citalopram 40 mg daily.  3. Trazodone 50 mg daily at bedtime as needed.  4. Lorazepam 0.5 mg as needed.  5. Aspirin 81 mg daily.  6. Aciphex 20 mg daily.  7. Diclofenac 75 mg daily.   DRUG ALLERGIES:  Morphine causes itch, OxyContin causes itch and  vomiting, aspirin in high doses causes itching, Lortab causing itching  and vomiting, Micardis HCT causes disorientation, Lipitor causes muscle  aches and pains, Altace causes shortness of breath, and codeine causes  shortness of breath and vomiting.   PHYSICAL EXAMINATION:  General:  The patient is a well-appearing male,  who appears his stated age in no acute distress.  Vital Signs:  Blood  pressure 135/66, pulse 78 and regular, and oxygen saturation 95% on room  air.  HEENT:  Unrevealing.  Neck:  Supple.  There is no cervical nor  supraclavicular lymphadenopathy.  There is no jugular venous distention.  No carotid bruits are noted.  Chest:   Auscultation of the chest reveals  clear breath sounds that are symmetrical bilaterally.  No wheezes,  rales, or rhonchi are noted.  Cardiovascular:  Regular rate and rhythm.  There is a prominent grade 4/6 vibratory systolic murmur heard best  along the right sternal border with blowing diastolic murmur heard along  the right sternal border and at the apex.  Abdomen:  Soft, nondistended,  and nontender.  Bowel sounds are present.  Extremities:  Warm and well  perfused.  There is no lower extremity edema.  Distal pulses are  palpable in the posterior tibial position.  Femoral pulses are palpable.  Rectal and GU:  Both deferred.  Neurologic:  Grossly nonfocal and  symmetrical throughout.   DIAGNOSTIC TESTS:  Transesophageal echocardiogram and left and right  heart catheterizations performed April 23, 2010, are both reviewed.  These confirmed the presence of severe aortic regurgitation.  There is  severe prolapse of the noncoronary cusp of the aortic valve with what  may be a perforation in the  leaflet.  There is moderate left ventricular  chamber enlargement.  Left ventricular systolic function appears normal.  The aortic root size appears normal.  There is trivial mitral  regurgitation.  No other significant abnormalities are noted.  Coronary  anatomy is normal.  There is no significant coronary artery disease.  There is left dominant coronary circulation.  The abdominal aorta and  iliac vessels are free of significant aortoiliac disease.   IMPRESSION:  Severe aortic regurgitation with moderate left ventricular  chamber enlargement.  The patient remains essentially asymptomatic with  perhaps mild exertional shortness of breath and fatigue at most.  Options include continued medical therapy versus surgical intervention.  It is conceivable that his valve might be repairable, but I am not  convinced that it could be repaired with a high degree of confidence or  without concern about  long-term durability.  Nevertheless, it could be  considered pending open exploration of the valve for definitive  examination of the anatomy.  Aortic valve replacement would be a more  conservative and straightforward approach.  The patient might be a  reasonable candidate for aortic valve replacement using a mini  thoracotomy approach.  If valve repair is to be contemplated, I would  not favor use of a minimally invasive approach due to concerns regarding  somewhat limited exposure of the aortic root.   PLAN:  I have discussed options and issues at length with the patient.  Continued medical therapy with close followup has been contrasted with  surgical intervention.  A variety of surgical approaches have been  discussed in detail.  We have also discussed whether or not valve  replacement would best be performed using a mechanical prosthesis versus  some type of bioprosthetic tissue valve.  We have also discussed  alternatives surgical approaches such as the Ross procedure, homograft,  or porcine aortic root replacement, and the questionable durability of  long-term followup after an attempted valve repair.  All of his  questions have been addressed.  The patient is contemplating options and  desires to seek a second opinion.  He will contact us in the future if  he wants to proceed with surgery.   [name], M.D.  Electronically Signed   CHO/MEDQ  D:  04/30/2010  T:  05/01/2010  Job:  [id number]   cc:   Noralyn Pick. Eden Emms, MD, [facility]  Bruce H. Swords, MD

## 2011-03-26 NOTE — Assessment & Plan Note (Signed)
[facility]                            CARDIOLOGY OFFICE NOTE   NAME:Farrel, EARLAND REISH                       MRN:          [id number]  DATE:09/22/2008                            DOB:          25-Feb-1946    Mr. Templin returns in follow up.  He has had aortic insufficiency.   He is doing well.  The citalopram has helped with his anxiety.   He still has some nightmares.  He thinks it maybe related to his  Niaspan.  I told him he could stop this at night and see if they  improve.  In regards to his heart, he has had a history of PAF which has  not recurred.  He does not need Coumadin.  He has not had any recurrent  palpitations.  He is active.  He is not having any shortness of breath,  PND, or orthopnea.  There has been no previous history of heart failure.  He had a 2-D echocardiogram today which I reviewed.  He continues to  have moderate LV cavity dilatation.  I believe his end-diastolic  dimension was 46, this is stable.  His EF is in the 55% range and  normal.  He has known severe aortic insufficiency.  Many times on echo  it is read as moderate or moderate to severe since it is eccentric;  however, he has had a heart cath on physical exam and by pressure half  time and no reversals in his descending thoracic aorta in my mind that  is severe.  However, his LV size and function have been stable and he is  asymptomatic.  I have not thought that he needed to be referred for  surgery.   He has been compliant with his meds.   I reviewed his echocardiogram with him today.  His dentition are in good  shape.   He is on lisinopril 30 mg a day, citalopram 40 mg a day, Singulair 10 a  day, and Niaspan.   PHYSICAL EXAMINATION:  GENERAL:  His exam is remarkable for a middle-  aged black male in no distress.  VITAL SIGNS:  His weight is 205, blood pressure 125/60, pulse pressure  mildly widened, pulse 72 and regular, respiratory rate 14, afebrile.  HEENT:   Unremarkable.  NECK:  He has prominent carotids, but they are not disseverance.  LUNGS:  Clear.  Good diaphragmatic motion.  No wheezing.  HEART:  S1 and S2.  He does have a mild AS or systolic murmur through  the valve, and he has significant diastolic murmur.  PMI is increased,  but not laterally displaced.  ABDOMEN:  Benign.  Bowel sounds positive.  No AAA.  No bruit.  No  hepatosplenomegaly or hepatojugular reflux.  No tenderness.  EXTREMITIES:  Distal pulses are intact.  No edema.  NEUROLOGIC:  Nonfocal.  SKIN:  Warm and dry.  MUSCULOSKELETAL:  No muscular weakness.   IMPRESSION:  1. Severe aortic insufficiency with no symptoms and left ventricular      function compensated.  Followup echo probably in 6 months to a  year.  Continue afterload reduction with lisinopril.  2. Hypertension, currently well controlled.  No need for diuretic.      Continue ACE inhibitor for afterload reduction.  3. Anxiety and depression.  Continue citalopram, improved.  4. Nightmares.  Not sure why he is having these.  They maybe      temporally related to the start of Niaspan a few months ago.  We      will hold this and see how he does.  5. Seasonal allergies.  Continue Singulair, currently under control.      The patient does not have any known coronary artery disease.  I      will have to look back through his chart to see what his lipids      are, but I suspect that he could tolerate not being on Niaspan if      they are related to his nightmare.     Noralyn Pick. Eden Emms, MD, [facility]  Electronically Signed    PCN/MedQ  DD: 09/22/2008  DT: 09/22/2008  Job #: [id number]

## 2011-03-26 NOTE — Assessment & Plan Note (Signed)
[facility]                                 ON-CALL NOTE   NAME:Otte, REASE WENCE                       MRN:          [id number]  DATE:11/01/2007                            DOB:          1946/02/14    I received a call from [facility], a patient of Dr. Eden Emms this  evening.  Mr. Scott has a history of aortic insufficiency with normal LV  function.  He was recently seen by Dr. Eden Emms December 2 and was felt to  be asymptomatic at that time and with plans to follow up echo in six  months.  Over the past two to three days, Mr. Kisling has had progressive  dyspnea on exertion and says he can now barely walk to his bathroom  without feeling very fatigued, weak, and short of breath.  He is  somewhat emotional on the phone saying that he is very tired, and he  knows he needs to be in the hospital.  He tells me that he has a friend  coming over to his house right now to take him to the [facility] emergency  room.  Initially upon talking, he did have somewhat labored speaking,  however, the more he talked, the more relaxed and comfortable he  sounded.  I agreed with him that he should present to the ED for further  evaluation, and that he may even consider calling 911, however, he  preferred to travel with his friend.      Nicolasa Ducking, ANP  Electronically Signed      Noralyn Pick. Eden Emms, MD, [facility]  Electronically Signed   CB/MedQ  DD: 11/01/2007  DT: 11/02/2007  Job #: [id number]
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
## 2011-03-29 ENCOUNTER — Ambulatory Visit (INDEPENDENT_AMBULATORY_CARE_PROVIDER_SITE_OTHER): Payer: 59 | Admitting: Internal Medicine

## 2011-03-29 ENCOUNTER — Encounter: Payer: Self-pay | Admitting: Internal Medicine

## 2011-03-29 DIAGNOSIS — Z0000 Encounter for general adult medical examination without abnormal findings: Secondary | ICD-10-CM

## 2011-03-29 DIAGNOSIS — E785 Hyperlipidemia, unspecified: Secondary | ICD-10-CM

## 2011-03-29 MED ORDER — SIMVASTATIN 20 MG PO TABS: 20.0000 mg | ORAL_TABLET | Freq: Every evening | ORAL | Status: DC

## 2011-03-29 NOTE — Op Note (Signed)
NAMEVICTORHUGO, [name]                ACCOUNT NO.:  [id number]   MEDICAL RECORD NO.:  [id number]          PATIENT TYPE:  OBV   LOCATION:  0098                         FACILITY:  [facility]   PHYSICIAN:  Lucrezia Starch. Earlene Plater, M.D.  DATE OF BIRTH:  1946/11/05   DATE OF PROCEDURE:  09/19/2004  DATE OF DISCHARGE:                                 OPERATIVE REPORT   PREOPERATIVE DIAGNOSIS:  Erectile dysfunction.   POSTOPERATIVE DIAGNOSIS:  Erectile dysfunction, bladder neck contracture.   PROCEDURE:  Cystoscopy, urethral dilation, placement of a three-piece AMS  penile prosthesis.   ATTENDING SURGEON:  Lucrezia Starch. Earlene Plater, M.D.   RESIDENT SURGEON:  Rhae Lerner, M.D.   ANESTHESIA:  General endotracheal anesthesia.   COMPLICATIONS:  None.   INDICATIONS FOR PROCEDURE:  [name] is a 65 year old male with a history  of adenocarcinoma of the prostate, who is status post radical retropubic  prostatectomy in 2003.  Following prostatectomy, [name] has complained of  persistent erectile dysfunction which has been unresponsive to oral therapy.  After a long discussion with [name] concerning the nature of his erectile  dysfunction as well as possible therapeutic options, [name] has elected  to proceed with implantation of a penile prosthesis.   DESCRIPTION OF PROCEDURE:  Patient was brought to the operating room.  Following induction of general endotracheal anesthesia, patient was placed  in the supine position in prepped and draped in the usual sterile fashion.  An attempt was subsequently made to pass a 16 French Foley catheter into the  bladder; however, resistance was met in the posterior urethra and bladder  neck region.  A second attempt was made to pass a 16 [place] coude catheter;  however, again, resistance would not allow passage of the catheter into the  bladder.  At this point, a flexible cystoscope was used to visually inspect  the entire urethra.  A bladder neck contracture  was subsequently noted, and  a guidewire passed easily through the bladder neck opening and into the  bladder.  The cystoscope was subsequently removed, and the bladder neck  contracture dilated up to an approximately 20 Jamaica.  A 16 Jamaica council-  tip catheter was subsequently passed over the wire into the bladder, and the  balloon inflated.  Once the catheter was in place, the bladder was drained,  and the catheter plugged.  A 5 cm midline incision was subsequently made  overlying the pubic bone and dissection carried down to the level of the  corporal bodies.  All adjacent tissues were dissected off of the corporal  bodies for optimal exposure.  Two adjacent interrupted 3-0 PDS sutures were  subsequently placed into each corporal body to provide traction.  A  corporotomy incision was subsequently made between each suture on each  corporal body.  The corporal bodies were subsequently dilated both  proximally and distally up to a size 12 without difficulty.  A size 13  dilator was able to be passed, both proximally and distally, in each  corporal body; however, there was some significant difficulty in passing  this dilator.  A measuring tool was subsequently used to determine the  length of the corporal bodies, which was estimated to be approximately 20  cm.  Considering the length and width of the corporal bodies, the decision  was subsequently made to attempt placement of a 700 CX AMS prosthesis at a  length of 18 cm with additional 2 cm rear-tipped extenders.  The cylinders  of the prosthesis were subsequently passed distally without difficulty using  a Furlow tool.  There was a significant amount of difficulty placing the  cylinders posteriorly.  Multiple attempts were made to seat the cylinders  posteriorly and the corporotomies were even extended to a point just distal  to the pubic bone.  Even with these efforts; however, there was significant  buckling of the cylinders on  inflation, and the decision was made to abort  attempts to place the 700 CX model and proceed with placement of a 700 CXR  AMS thin prosthesis, again, at a length of 18 cm with 2 cm ribs of  extenders.  Again, the cylinders were placed without difficulty using a  Furlow tool.  The cylinders were also guided into place posteriorly without  difficulty.  Once the cylinders have been inflated and it had been confirmed  that these cylinders were not buckling, the corporotomies were closed using  running 3-0 PDS suture.  In addition, the previously placed traction sutures  were tied for additional closure support.  A small incision was subsequently  made in the fascia suprapubically, and a pocket developed posterior to the  left rectus abdominus muscle.  A 65 cc reservoir was subsequently placed in  the space of Retzius, and the fascia closed using a running 3-0 PDS.  A  pocket was subsequently created in the right dependent scrotum, and the pump  mechanism placed within this pocket.  All tubing was subsequently tested and  connected.  The final test of the prosthesis was performed, and it appeared  to be functioning appropriately.  Superficial fascia was subsequently closed  using a running 3-0 chromic suture.  The skin was then reapproximated using  skin staples.  Patient was subsequently allowed to awaken, and the case was  ended.  The patient tolerated the procedure well.  There were no  complications.   Dr. Darvin Neighbours was present for the entire case and participated in all  aspects of the procedure.     Boykin Reaper   EG/MEDQ  D:  09/19/2004  T:  09/19/2004  Job:  [id number]

## 2011-03-29 NOTE — Progress Notes (Signed)
  Subjective:    Patient ID: [name], male    DOB: 1946-07-11, 65 y.o.   MRN: [id number]  HPI  cpx Past Medical History  Diagnosis Date  . Hypertension   . Hyperlipidemia   . Atrial fibrillation   . Mitral regurgitation   . Hypercholesteremia   . Insomnia   . Anxiety   . Aortic regurgitation   . History of prostate cancer   . Anticoagulants causing adverse effect in therapeutic use    Past Surgical History  Procedure Date  . Cardiac catheterization   . Penile prosthesis implant   . Prostate surgery   . Cataract extraction     L 12/06/09   R 09/14/09  . Aortic valve replacement 2011    [facility]     reports that he quit smoking about 30 years ago. His smoking use included Cigarettes. He smoked 1 pack per day. He has never used smokeless tobacco. He reports that he does not drink alcohol or use illicit drugs. family history includes Cancer in his sister and unspecified family member; Hyperlipidemia in his other; and Hypertension in his other. Allergies  Allergen Reactions  . Aspirin     Can't take high dose  . Atorvastatin     REACTION: myalgia  . Buspirone Hcl     REACTION: headache  . Codeine Phosphate     REACTION: upset stomach, itching  . Hydrocodone-Acetaminophen   . Morphine Sulfate     REACTION: itching  . Oxycodone Hcl   . Rabeprazole     REACTION: headache  . Ramipril     REACTION: unspecified  . Telmisartan-Hctz     REACTION: sob,dizzy,palpitations     Review of Systems  patient denies chest pain, shortness of breath, orthopnea. Denies lower extremity edema, abdominal pain, change in appetite, change in bowel movements. Patient denies rashes, musculoskeletal complaints. No other specific complaints in a complete review of systems.      Objective:   Physical Exam Well-developed male in no acute distress. HEENT exam atraumatic, normocephalic, extraocular muscles are intact. Conjunctivae are pink without exudate. Neck is supple without lymphadenopathy,  thyromegaly, jugular venous distention. Chest is clear to auscultation without increased work of breathing. Cardiac exam S1-S2 are regular. The PMI is normal. No significant murmurs or gallops. Abdominal exam active bowel sounds, soft, nontender. No abdominal bruits. Extremities no clubbing cyanosis or edema. Peripheral pulses are normal without bruits. Neurologic exam alert and oriented without any motor or sensory deficits.       Assessment & Plan:  Well visit. Health maintenance issues are up-to-date.

## 2011-03-29 NOTE — H&P (Signed)
[name], [name]                ACCOUNT NO.:  [id number]   MEDICAL RECORD NO.:  [id number]          PATIENT TYPE:  AMB   LOCATION:  DAY                          FACILITY:  [facility]   PHYSICIAN:  Ronald L. Earlene Plater, M.D.  DATE OF BIRTH:  07/28/1946   DATE OF ADMISSION:  09/19/2004  DATE OF DISCHARGE:                                HISTORY & PHYSICAL   CHIEF COMPLAINT:  Erectile dysfunction.   HISTORY OF PRESENT ILLNESS:  [name] is a 65 year old male who is status  post radical retropubic prostatectomy on April 30, 2002. The patient has done  well postoperatively from an oncology standpoint, however, he has continued  to have problems with erectile dysfunction.  [name] has tried oral  therapy and this has failed to produce adequate erections.  After a long  discussion with [name] concerning possible treatments for erectile  dysfunction, he has elected to proceed with implantation of a penile  prosthesis.   REVIEW OF SYMPTOMS:  The patient denies any fever, chills, nausea, vomiting,  diarrhea, constipation, chest pain, shortness of breath, all other systems  are negative.   PAST MEDICAL HISTORY:  Significant for prostate cancer.   PAST SURGICAL HISTORY:  Status post radical retropubic prostatectomy, status  post right rotator cuff surgery.   ALLERGIES:  CODEINE, OXYCONTIN, LORTAB, ASPIRIN, ALTACE.   SOCIAL HISTORY:  The patient has a 15 pack year smoking history but quit 20  years ago.  He reports occasional alcohol use.   FAMILY HISTORY:  Negative for any history of GU malignancy.   MEDICATIONS:  1.  Zetia.  2.  Baby aspirin.   PHYSICAL EXAMINATION:  VITAL SIGNS:  The patient is afebrile and  hemodynamically stable.  GENERAL:  Well-developed, well-nourished male in no acute distress.  HEENT:  Pupils equal round and reactive to light, extraocular movements  intact.  NECK:  No masses.  HEART:  Regular rate and rhythm without murmurs.  LUNGS:  Clear to auscultation  bilaterally.  ABDOMEN:  Soft, nontender, nondistended, bowel sounds positive.  EXTREMITIES:  No cyanosis, clubbing or edema.  NEUROLOGIC:  Cranial nerves II-XII grossly intact.  GU:  Normal phallus with no plaques or masses.  Testicles descended  bilaterally.   ASSESSMENT/PLAN:  A 65 year old male with erectile dysfunction following  radical retropubic prostatectomy.  Will plan to take [name] to the  operating room at which time we will implant a three piece AMS inflatable  penile prosthesis.     Boykin Reaper   EG/MEDQ  D:  09/19/2004  T:  09/19/2004  Job:  [id number]

## 2011-03-29 NOTE — Op Note (Signed)
[facility]  Patient:    [name], [name] Visit Number: [id number] MRN: [id number]          Service Type: SUR Location: 3W 0340 01 Attending Physician:  Tania Ade Dictated by:   Lucrezia Starch. Ovidio Hanger, M.D. Proc. Date: 04/30/02 Admit Date:  04/30/2002                             Operative Report  DIAGNOSIS:  Adenocarcinoma of the prostate.  OPERATION:  Radical retropubic prostatectomy.  SURGEON:  Lucrezia Starch. Ovidio Hanger, M.D.  ASSISTANT:  Excell Seltzer. Annabell Howells, M.D.  ANESTHESIA:  General endotracheal  ESTIMATED BLOOD LOSS:  800 cc  DRAINS:  16 French Foley and a large flat Blake drain  COMPLICATIONS:  None.  INDICATION FOR PROCEDURE:  [name] is a very nice 65 year old white male who presented with an elevated PSA of 6.7 and subsequently underwent transrectal ultrasound and biopsy of the prostate by Dr. Javier Glazier and was found to have a Gleason score 6 which is 3 + 3 adenocarcinoma bilaterally.  After understanding the risks and alternatives and multiple specialties and second opinions on his pathology, he has elected to proceed with radial retropubic prostatectomy.  DESCRIPTION OF PROCEDURE:  The patient was placed in the supine position. After proper general endotracheal anesthesia was prepped and draped with Betadine in a sterile fashion.  Lower midline abdominal incision was made. Sharp dissection was carried down through the subcutaneous tissues after a 24 French 30 cc balloon Foley had been placed in the bladder to drain.  The anterior rectus sheath was incised in the linea alba.  Rectus muscle bellies were retracted laterally and space of Retzius was entered.  There was no unusual lymphadenopathy noted.  The Buchwalter retractor was placed and bilateral pelvic lymphadenectomies were performed.  Lateral lymph nodes were avoided.  The hypogastric external iliac and obturator lymph node packets were dissected, resected and lymphatics  were clipped.  Obturator nerve was identified and protected.  Following this endopelvic fascia was perforated bilaterally.  The puboprostatic ligaments were sharply incised, not in total approximately 1/2 inch and the deep dorsal vein of the penis was circled with a Hoefeltner retractor and ligated with #1 Vicryl sutures.  Back bleeders were oversewn with 2-0 chromic catgut.  The dorsal vein complex was sharply incised with Bovie coagulation cautery.  The pillars of the prostate were approached and dissected free.  The urethra was encircled with a right angle.  Umbilical tape was placed posterior to it.  The anterior rectus sheath was incised. Catheter was delivered into the field and the posterior rectus sheath was incised.  There was a thick rectal urethralis muscle with significant scarring and some tenting of the rectum.  This was taken down carefully and the rectum was avoided.  The lateral pedicles of the prostate were then taken serially in packets and clipped with large hemlock clips. The transversalis fascia was incised posteriorly.  The seminal vesicles and the ampulla of the vas deferens were clipped and cut.  The tails of the seminal vesicles were maintained in place. There were very deep and it was felt that the dissection was not necessary and would potentially cause problems.  The neurovascular bundles were visualized and protected particularly on the right side. The left side was somewhat adherent.  An amp of Indigo Carmine was given IV.  The bladder neck was incised sharply with Bovie coagulation cautery and  the posterior bladder neck was incised and the specimen submitted to pathology.  Thorough irrigation was performed.  Good hemostasis was noted to be present. The mucosa was plicated to the serosa of the bladder neck with interrupted 5-0 chromic catgut.  A tennis racket type closure was performed with running 3-0 chromic catgut and the urethral vesicle anastomosis was  performed with 2-0 Vicryl sutures utilizing UR5 needles.  Stitches were placed in the 5:00, 7:00, 3:00, 9:00 and 12:00 positions over a 22 [place] 10 cc balloon catheter with 15 cc in the balloon.  A safety stitch was placed with #1 Prolene suture, exited from the bladder and sewn over a button.  The ties were tied down and it was thoroughly irrigated.  Good water tight closure was noted to be present and good hemostasis was noted to be present.  The wound was irrigated and the rectus muscle bellies were approximated in the midline with interrupted 0 chromic catgut and the fascia was closed with running #1 PDS and the skin was closed with skin staples and dressed in a sterile fashion.  The patient tolerated the procedure well and was taken to the recovery room stable. Dictated by:   Lucrezia Starch. Ovidio Hanger, M.D. Attending Physician:  Tania Ade DD:  04/30/02 TD:  05/02/02 Job: [id number]

## 2011-03-29 NOTE — Op Note (Signed)
[place]  Patient:    [name], [name] Visit Number: [id number] MRN: [id number]          Service Type: DSU Location: [facility] Attending Physician:  Colbert Ewing Dictated by:   Loreta Ave, M.D. Proc. Date: 01/15/02 Admit Date:  01/15/2002 Discharge Date: 01/15/2002                             Operative Report  PREOPERATIVE DIAGNOSIS:  Chronic impingement, partial thickness tear of rotator cuff and degenerative joint disease acromioclavicular joint, left shoulder.  Posterior lipoma.  POSTOPERATIVE DIAGNOSIS:  Chronic impingement, partial thickness tear of rotator cuff and degenerative joint disease acromioclavicular joint, left shoulder.  Posterior lipoma.  OPERATION PERFORMED:  Left shoulder examination under anesthesia.  Open excision of posterior lipoma.  Arthroscopy with debridement of rotator cuff. Arthroscopic acromioplasty with coracoacromial ligament release.  Excision of distal clavicle.  SURGEON:  Loreta Ave, M.D.  ASSISTANT:  Arlys John D. Petrarca, P.A.-C.  ANESTHESIA:  General.  ESTIMATED BLOOD LOSS:  Minimal.  SPECIMENS:  Excised lipoma.  CULTURES:  None.  COMPLICATIONS:  None.  DRESSING:  Soft compressive.  DESCRIPTION OF PROCEDURE:  The patient was brought to the operating room and placed on the operating table in supine position.  After adequate anesthesia had been obtained, left shoulder examined.  Full motion, good stability. Prepped and draped in the usual sterile fashion after being placed in beach chair position on the shoulder positioner.  Lipoma which was over the posterior aspect of the scapular spine was approached through a small transverse incision.  The skin and subcutaneous tissues divided.  Lipoma and pseudocapsule was shelled out in their entirety and sent for specimen.  The wound irrigated and closed with subcutaneous and subcuticular Vicryl.  Three portals were then created, one each  anterior, posterior and lateral, standard arthroscopic shoulder portals.  Shoulder entered with blunt obturator, distended and inspected.  Some grade 2 and 3 changes in the humerus, all debrided to a stable surface.  Labrum intact, biceps intact, capsular ligamentous structures intact.  Marked extensive partial thickness tearing crescent portion supraspinatus debrided to stable surface.  About half thickness of the tendon left there.  No full thickness tears.  Cable still remained well anchored anteriorly and posteriorly.  No subluxation biceps. Cannula redirected subacromially.  Chronic impingement with abrasive changes on the top of the cuff, debrided to a stable surface.  Type 3 acromion.  After the bursa resected and cuff debrided, it was deemed that open repair of the cuff was not indicated.  Acromioplasty to a type 1 acromion with shaver and high speed bur with coracoacromial ligament release.  Distal clavicle grade 4 changes.  Lateral centimeter sharply resected with shaver and high speed bur. Adequacy of excision confirmed viewing from all portals.  Instruments and fluid removed.  Portals of shoulder and bursa injected with Marcaine.  Portals closed with 4-0 nylon.  Sterile compressive dressing applied.  Anesthesia reversed.  Brought to recovery room.  Tolerated surgery well.  No complications. Dictated by:   Loreta Ave, M.D. Attending Physician:  Colbert Ewing DD:  01/15/02 TD:  01/18/02 Job: [id number]

## 2011-03-29 NOTE — Discharge Summary (Signed)
NAMECHEVY, SWEIGERT NO.:  [id number]   MEDICAL RECORD NO.:  [id number]          PATIENT TYPE:  INP   LOCATION:  2002                         FACILITY:  [facility]   PHYSICIAN:  Dorian Pod, NP    DATE OF BIRTH:  Dec 01, 1945   DATE OF ADMISSION:  01/30/2005  DATE OF DISCHARGE:  02/01/2005                                 DISCHARGE SUMMARY   PRIMARY CARE PHYSICIAN:  Corwin Levins, MD, [facility].   DISCHARGE DIAGNOSES:  1.  Atrial fibrillation currently in normal sinus rhythm.  2.  Status post cardiac catheterization on January 31, 2005, by Dr. Shawnie Pons showing moderate global hypokinesis perhaps related to the      patient's atrial fibrillation, but relationship to aortic regurgitation      cannot be excluded; moderately severe to severe aortic valvular      regurgitation; mild atherosclerotic change of the mid left anterior      descending with only minor abnormalities.  3.  Sick sinus syndrome.   PAST MEDICAL HISTORY:  1.  Last cardiac catheterization in June of 2004.  Coronaries were normal at      that time.  EF was 55%.  2.  Paroxysmal atrial fibrillation that converted spontaneously in January      of 2005.  3.  Hyperlipidemia.  4.  Erectile dysfunction requiring a penile implant in November of 2005.  5.  Carcinoma of the prostate requiring surgical resection in 2003.  6.  Systolic hypertension.   PROCEDURES THIS ADMISSION:  Cardiac catheterization on March 23rd, results  as stated above.   DISPOSITION:  Patient is being discharged home with instructions to resume  his Cozaar 50 mg daily, Zetia 10 mg daily.  He is also being placed on  Lopressor 25 mg.  He is to take half a tablet p.o. b.i.d.   ACTIVITY:  No driving x2 days.  No lifting over 10 pounds x1 week.   WOUND CARE:  No tub bathing x1 week.   SPECIAL INSTRUCTIONS:  He is to call our office for any problems with his  cath site including fever, pain, or increased swelling.  He has a  followup  appointment with Dr. Eden Emms in two weeks, April 10th, at 4 p.m.   HISTORY OF PRESENT ILLNESS:  Mr. Trompeter is a 65 year old, African-American  gentleman with chronic aortic insufficiency, history of paroxysmal atrial  fibrillation as stated above, who is followed by Dr. Eden Emms for several  years.  The patient is very active and asymptomatic.  He has aortic root  dilatation, but the exact etiology is not clearly identified.  The patient  was seen on the day prior to admission by his primary care physician for  routine exam,  his EKG at that time demonstrating a lateral ST segment  elevation with reciprocal inferior ST depression and T-wave inversion.  This  was changed from his previous EKG tracings.  However, patient was  asymptomatic and no further assessment was undertaken.  However, on the  morning of admission, patient woke up not feeling well.  He described some  mild chest pressure that was nonradiating with some intermittent dizziness  and exercise intolerance.  He continued to experience these symptoms and  called our office and was sent over to [facility] to be  evaluated.  His EKG on arrival to the emergency room showed atrial  fibrillation with a controlled ventricular response.  Left ventricular  hypertrophy is present with nonspecific ST-T wave abnormalities including  minor T-wave inversion in the inferior leads.  It was felt that Mr. Couey'  had a sick sinus syndrome with recurrent atrial fibrillation and a slow  ventricular response in the absence of a rate controlled medication.  Mr.  Covin' also has a QUESTIONABLE INTOLERANCE TO ASPIRIN in the past, so he was  anticoagulated with heparin and was scheduled for a cardiac catheterization  on the following morning.  Two-D echo also done on the 23rd results showing  overall left ventricular systolic function was at the lower ends of normal.  Ventricular ejection fraction was estimated to be 55%.  There  was possible  hypokinesis of the basal inferoposterior wall.  Left ventricular wall  thickness was mildly increased.  Aortic insufficiency was probably moderate.  It was anticipated that the patient would need a TEE with cardioversion;  however, patient converted to normal sinus rhythm on the 23rd.  Dr. Maurine Cane in to see patient on day of discharge.  Patient being discharged home  with instructions to follow up with Dr. Eden Emms in two weeks for re-  evaluation.  Will need to have his LV function re-evaluated and the possible  consider need for atrial valve replacement at some point.      MB/MEDQ  D:  02/01/2005  T:  02/02/2005  Job:  [id number]

## 2011-03-29 NOTE — Discharge Summary (Signed)
[facility]  Patient:    [name], [name] Visit Number: [id number] MRN: [id number]          Service Type: SUR Location: 3W 0340 01 Attending Physician:  Tania Ade Dictated by:   Lucrezia Starch. Ovidio Hanger, M.D. Admit Date:  04/30/2002 Discharge Date: 05/04/2002                             Discharge Summary  DIAGNOSIS:  Adenocarcinoma of the prostate.  OPERATIVE PROCEDURE:  Radical retropubic prostatectomy on 04/30/02.  HISTORY OF PRESENT ILLNESS:  The patient is a very nice 65 year old black male who was found to have an elevated PSA of 6.7.  He subsequently underwent transrectal ultrasound and biopsy of the prostate by Dr. Truett Perna, and was found to have a Gleason score 6, which is 3+3 adenocarcinoma bilaterally.  He considered all options.  After understanding risks, benefits, and alternatives, elected to proceed with radical retropubic prostatectomy.  Past medical history, social history, family history, and review of systems, please see history and physical examination for full details.  PHYSICAL EXAMINATION:  VITAL SIGNS:  He was afebrile, vital signs were stable.  GENERAL:  He was well-developed, well-nourished, well-groomed.  HEENT:  Pupils are equal, round and reactive to light and accommodation. Ears, nose, and throat clear.  NECK:  Without masses or thyromegaly.  CHEST:  Clear anterior and posterior without rales or rhonchi.  HEART:  He had a holosystolic murmur without gallops which had been present for years.  ABDOMEN:  Soft, nontender, no masses or organomegaly.  EXTREMITIES:  Normal.  NEUROLOGIC:  Intact.  GENITOURINARY:  Prostate was 25 g, really soft, and fairly benign to palpation.  HOSPITAL COURSE:  The patient was admitted.  After undergoing proper preoperative evaluation, was subsequently taken to surgery on 04/30/02, and underwent a radical retropubic prostatectomy uneventfully.  Immediately postoperatively  his hemoglobin was 13.3, hematocrit 33.8, white blood cell count was 6.5.  BMET was essentially normal and he was comfortable.  By 05/01/02, he had a low-grade fever only.  Chest was clear, abdomen was soft. Hemoglobin was 9.5, hematocrit 28.0.  BMET was normal.  He was begun on iron sulfate, and he was tolerating a liquid diet.  By 05/02/02, he had a temperature max of 103.8 degrees Fahrenheit the night before.  He was now afebrile and comfortable.  Chest x-ray revealed right basilar atelectasis only.  Chest had a few rales in both bases, right greater than left.  Abdomen was soft, wounds were clear.  Blake output was decreasing.  Hemoglobin was 9.4, hematocrit 26.9, white blood cell count was 7.4.  It was felt that the fever was most likely due to atelectasis.  The diet was increased.  The pulmonary toilet was increased.  By 05/03/02, postoperative day #3, he was afebrile, comfortable, tolerating a diet, abdomen was soft, Blake output was modest, therefore it was converted to a passive drain.  He was showered.  He continued to progressively improve.  By 05/04/02, he was afebrile and comfortable.  Abdomen was soft, Foley was patent, Blake output was 30 cc per shift, so the drain was maintained.  He was discharged.  DISCHARGE MEDICATIONS: 1. Darvocet-N 100 2. Ditropan XL.  CONDITION ON DISCHARGE:  Improved.  FOLLOWUP:  In one week.  His final pathology was discussed in detail which was a stage PT2C TN0 PMX Gleason score 6 which was 3+3 adenocarcinoma. Dictated by:   Lucrezia Starch.  Ovidio Hanger, M.D. Attending Physician:  Tania Ade DD:  05/18/02 TD:  05/20/02 Job: [id number]

## 2011-03-29 NOTE — Assessment & Plan Note (Signed)
[facility]                              CARDIOLOGY OFFICE NOTE   NAME:[name], [name] RAPOZO                       MRN:          [id number]  DATE:06/23/2006                            DOB:          1946/05/14    Justus returns today for followup.  He had an episode on August 9 where he  went to the ER.  He felt that his heart rate was high and his blood pressure  was high.  He did take an extra half of Lopressor and when he got to the ER  his pulse was 80 and his blood pressure was fine.  He was sent home without  further workup.  He has severe AI but a compensated ventricle and we have  been following him.  He is 65 years old.  I told him it would be nice to be  able to wait until he is 65 in regards to having a single tissue valve  surgery.  He has not had any significant congestive heart failure.  He has  not had recurrent palpitations or blood pressure problems since he went to  the ER.  We only have him on a half of a 25 mg Lopressor tablet.  On higher  doses he gets nightmares.  He is on an ACE inhibitor.   On exam, he looks well.  Pressure is mildly widened at 128/58.  Pulse is 70  and regular.  Lungs are clear.  Carotids are prominent.  There is an S1 S2.  He has a fairly loud decrescendo AI murmur with a systolic flow murmur  through the valve.  His abdomen is benign.  Lower extremities have intact  pulses and no edema.   IMPRESSION:  Somewhat labile blood pressure, better controlled now.  Continue beta blocker and angiotensin converting enzyme inhibitor.  Follow  up with me in 6 months.  He will continue to have surveillance of his left  ventricular cavity size and function both by echo, MRI and/or  catheterization when necessary.                               Noralyn Pick. Eden Emms, MD, [facility]    PCN/MedQ  DD:  06/23/2006  DT:  06/24/2006  Job #:  [id number]

## 2011-03-29 NOTE — H&P (Signed)
[facility]  Patient:    [name], [name] Visit Number: [id number] MRN: [id number]          Service Type: SUR Location: 3W 0340 01 Attending Physician:  Tania Ade Dictated by:   Lucrezia Starch. Ovidio Hanger, M.D. Admit Date:  04/30/2002                           History and Physical  CHIEF COMPLAINT:  "I have prostate cancer."  HISTORY OF PRESENT ILLNESS:  [name] is a very nice 66 year old black male who was found to have an elevated PSA of 6.7 and underwent transrectal ultrasound and biopsy of the prostate by Dr. Mickel Crow and was found to have a Gleason 6 which was 3+3 carcinoma bilaterally.  He has considered all options carefully and had multiple opinions, and after understanding risks, benefits, and alternatives, elected to proceed with radical retropubic prostatectomy.  PAST MEDICAL HISTORY: 1. He has a known heart murmur.  He has been worked up for that thoroughly. 2. He has anxiety. 3. He had some elevated cholesterol.  MEDICATIONS: 1. Celexa. 2. Lorazepam. 3. Clarinex. 4. A cholesterol medication.  ALLERGIES:  He is allergic to ASPIRIN, OXYCONTIN, CODEINE, and LORTAB, more of an intolerance situation.  SOCIAL HISTORY:  He is a negative drinker.  He has occasionally smoked in the past, but not recently.  FAMILY HISTORY:  There is a remote family history of prostate cancer.  REVIEW OF SYSTEMS:  He has no shortness of breath, dyspnea on exertion, chest pain, or GI complaints.  He does have a heart murmur as noted.  PHYSICAL EXAMINATION:  VITAL SIGNS:  Blood pressure is 130/70, pulse 78, respirations 16, temperature 98.7 degrees Fahrenheit.  GENERAL:  He is well-nourished, well-developed, well-groomed.  HEENT:  PERRLA.  Ears, nose, and throat clear.  NECK:  Without masses or thyromegaly.  CHEST:  Clear.  The patient is without rales or rhonchi.  HEART:  He has a holosystolic murmur without gallops.  ABDOMEN:  Soft,  nontender without masses or organomegaly.  EXTREMITIES:  Normal.  NEUROLOGIC:  Intact.  SKIN:  Normal.  GENITALIA:  Penis, meatus, scrotum, testicles, adnexa, and perineum are normal.  RECTAL:  Rectal vault is empty.  Prostate is approximately 25 g and really soft and benign to palpation.  IMPRESSION:  Adenocarcinoma of the prostate, states T1c, Gleason score 6.  PLAN:  Right upper retropubic prostatectomy.Dictated by:   Lucrezia Starch. Ovidio Hanger, M.D. Attending Physician:  Tania Ade DD:  04/30/02 TD:  05/01/02 Job: [id number]

## 2011-03-29 NOTE — H&P (Signed)
NAMECHRISTAPHER, [name] NO.:  [id number]   MEDICAL RECORD NO.:  [id number]          PATIENT TYPE:  INP   LOCATION:  2002                         FACILITY:  [facility]   PHYSICIAN:  Bardwell Bing, M.D.  DATE OF BIRTH:  1946-03-21   DATE OF ADMISSION:  01/30/2005  DATE OF DISCHARGE:                                HISTORY & PHYSICAL   REFERRING PHYSICIAN:  Corwin Levins, M.D. [facility]   PRIMARY CARDIOLOGIST:  Charlton Haws, M.D.   HISTORY OF PRESENT ILLNESS:  A 65 year old gentleman with chronic aortic  insufficiency and a history of paroxysmal atrial fibrillation referred for  direct admission due to recurrent AF with symptoms and a recently markedly  abnormal office EKG. [name] has been followed by Dr. Eden Emms for a number  of years with moderate to severe AI. The patient is active and asymptomatic.  He has aortic root dilatation, but the exact etiology is not clearly  identified in the available records. Likewise, we have no echocardiographic  data. He apparently has not had an cardiac ultrasound study for two years or  so. Nonetheless, he was doing fine and was seen by his primary care doctor  yesterday for routine evaluation. Inexplicably, his EKG demonstrated a  lateral ST segment elevation with reciprocal inferior ST depression and deep  T-wave inversion. This was changed from prior tracings. The rhythm was  sinus. Due to his absence of symptoms and the fact that it was a routine  visit, no further assessment was undertaken. This morning, he awoke, not  feeling well. He described some mild chest pressure that is nonradiating.  There is some mild dyspnea, intermittent dizziness, exercise intolerance,  and general weakness. Nonetheless, he went to work. He continued to  experience these symptoms and called the cardiology office from whence he  was referred to the hospital.   The patient underwent cardiac catheterization in June 2004.  Coronaries were  normal at that  time.  The ejection fraction was 0.55. AI was graded at 3+.  He had a brief episode of atrial fibrillation that converted spontaneously  in January 2005. No specific treatment was provided at that time.   PAST MEDICAL HISTORY:  Otherwise notable for mild hyperlipidemia. He has  erectile dysfunction and required a penile implant in November 2005. He had  carcinoma of the prostate requiring surgical resection in 2003.   Recent medications have included Cozaar 50 mg daily, Zetia 10 mg daily,  aspirin 81 mg daily, and Ativan 0.5 mg p.r.n. He has had adverse reactions  to ASPIRIN at a dose of 325 mg (hives), CODEINE, MORPHINE, and ALTACE.   SOCIAL HISTORY:  Lives [place] in [place]. 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 Denies excessive use of alcohol. He has a 30-pack year  history of cigarette smoking. Denies use of illicit drugs including cocaine.  Performs tai-chi.   FAMILY HISTORY:  Father died at age 75 due to carcinoma of the prostate;  mother suffered a stroke at age 65.  No coronary disease.   REVIEW OF SYSTEMS:  Entirely negative.   PHYSICAL EXAMINATION:  VITAL SIGNS: On exam, the heart rate is 82 and  irregular, respirations 20, blood pressure 200/60, afebrile.  HEENT: Anicteric sclerae; normal lids and conjunctiva.  NECK: Slightly bounding pulses; no jugular venous distention.  ENDOCRINE: No thyromegaly.  LUNGS: Clear.  CARDIAC: Irregular rhythm; a grade 3/6 harsh decrescendo diastolic murmur  heard across the precordium; no left ventricular lifts; PMI not markedly  displaced.  ABDOMEN: Soft and nontender; normal bowel sounds; no organomegaly.  EXTREMITIES: Normal distal pulses; no edema.  NEUROMUSCULAR: Symmetric strength and tone.  MUSCULOSKELETAL: No joint deformities.  SKIN: No significant lesions.  HEMATOPOIETIC: No cervical, axillary, or inguinal adenopathy.  PSYCHIATRIC: Normal affect.   EKG: Tracing obtained yesterday as described. Tracing today  demonstrates  atrial fibrillation with a controlled ventricular response. Left ventricular  hypertrophy is present with nonspecific ST-T wave abnormalities including  minor T-wave inversion in the inferior leads.   Other laboratories pending.   IMPRESSION:  [name] has sick sinus syndrome with recurrent atrial  fibrillation and a slow ventricular response in the absence of rate control  medication. He is substantially symptomatic with this. This may indicate  there is some hemodynamic compromise from his valvular heart disease, but  probably more reflects his response to his arrhythmia. As a result, it will  be necessary to maintain him in sinus rhythm. We will give him 24 hours to  see if her reverts spontaneously. If not he can be cardioverted and  consideration given to antiarrhythmic therapy. For now, heparin will be  utilized even though he is low risk for thromboembolism.   He has systolic hypertension, which partially reflects his aortic  insufficiency. We will treat him with low-dose beta blocker and intravenous  nitroglycerin in an attempt to improve his peak pressure.   Aortic regurgitation is apparently related to aortic root dilatation of  uncertain etiology. Syphilis unlikely but should be excluded. Likewise, a  bicuspid valve is a major consideration. An echocardiogram will be  performed.   His other medical issues appear stable. Since he had a questionable reaction  to aspirin in the past, will be anticoagulated with heparin and has no  compelling indication for aspirin. This will be discontinued. Dr. Eden Emms has  scheduled Mr. Perotti for cardiac catheterization in the morning. This is  reasonable in light of the EKG abnormalities seen yesterday for which I have  no credible explanation. Coronary artery spasm is a consideration, but it is  hard to believe that he had these EKG changes without any symptoms  whatsoever.     RR/MEDQ  D:  01/30/2005  T:  01/30/2005   Job:  [id number]

## 2011-03-29 NOTE — Cardiovascular Report (Signed)
[name], [name] NO.:  [id number]   MEDICAL RECORD NO.:  [id number]          PATIENT TYPE:  INP   LOCATION:  2002                         FACILITY:  [facility]   PHYSICIAN:  [name]. Riley Kill, M.D. [facility] OF BIRTH:  02-10-1946   DATE OF PROCEDURE:  01/31/2005  DATE OF DISCHARGE:                              CARDIAC CATHETERIZATION   INDICATIONS:  [name] is a 65 year old gentleman who was admitted with  atrial fibrillation. He has known aortic regurgitation and has been followed  by Dr. Charlton [name]. A left heart catheterization with aortic root was  requested and a specific request was made not to perform right heart  catheterization. Apparently, the patient had a fair amount of difficulty  last time with inability to cannulate the femoral vein. As a result, the  request was made not to perform right heart catheterization at this  juncture.   PROCEDURE:  1.  Left heart catheterization.  2.  Selective left ventriculography.  3.  Selective root aortography.  4.  Selective coronary arteriography.   DESCRIPTION OF PROCEDURE:  The patient was brought to the cath lab and  prepped and draped in usual fashion. Then 1% Xylocaine was used for local  anesthesia. Through an anterior puncture, the femoral artery was entered. A  6-French sheath was placed. Central aortic and left ventricular pressures  were measured with a pigtail. Ventriculography was performed in the RAO  projection. Following pullback, and LAO aortogram was performed without  difficulty. Following this, RCA angiography and LCA angiography were  performed without difficulty. The patient ended up tolerating the procedure  well. There were no complications. He was taken to the holding area in  satisfactory clinical condition.   HEMODYNAMIC DATA:  1.  Central aortic pressure 112/51, mean 76.  2.  Left ventricular pressure 109/5.  3.  No significant gradient on pullback across aortic valve.   ANGIOGRAPHIC DATA:  1.  Ventriculography was performed in the RAO projection. There was moderate      global hypokinesis. We measured the ejection fraction and it was      measured at 41%.  2.  The aortic root appeared to be perhaps mildly dilated. There was aortic      regurgitation that was measured at 3-4+.  3.  The left main was free of critical disease.  4.  Left anterior descending artery coursed to the apex. Just after the      diagonal is an area of segmental plaquing of about 30-40% best seen in      the straight RAO views. In some views there is a hypodensity, Dr. Juanda [name]      and I reviewed the films and we felt this was most likely streaming. The      distal vessel wrapped the apex and there were no significant distal      lesions.  5.  The circumflex is a very large caliber vessel. There are multiple      marginal branches. There is mild luminal irregularity but no significant      focal areas of narrowing.  6.  The right coronary artery has a slightly awkward takeoff, but it does      occur in the right coronary cusp. There is some tortuosity proximally.      The vessel provides posterior descending and posterolateral branch. No      areas of high-grade focal obstruction are noted.   CONCLUSION:  1.  Moderate global hypokinesis perhaps related to the patient's atrial      fibrillation but relationship to aortic regurgitation cannot be      excluded.  2.  Moderately severe to severe aortic valvular regurgitation.  3.  Mild atherosclerotic change of the mid left anterior descending artery      with only minor abnormalities as noted above.   DISPOSITION:  I will review the films with Dr. Charlton [name]. The patient may  need a right heart catheterization and we will order a 2-D echocardiogram to  see if the LV findings were confirmed and to measure the end-systolic  dimension. The patient will need to go on to anticoagulation. He is now in  sinus rhythm. This is [name].  Stuckey end of dictation      TDS/MEDQ  D:  01/31/2005  T:  01/31/2005  Job:  [id number]   cc:   CV Laboratory   Patient's medical record   [name], M.D.   [name], M.D. [facility]

## 2011-03-29 NOTE — Cardiovascular Report (Signed)
NAME:  [name], [name]                          ACCOUNT NO.:  [id number]   MEDICAL RECORD NO.:  [id number]                   PATIENT TYPE:  OIB   LOCATION:  2857                                 FACILITY:  [facility]   PHYSICIAN:  Charlton Haws, M.D.                  DATE OF BIRTH:  05-19-1946   DATE OF PROCEDURE:  04/14/2003  DATE OF DISCHARGE:                              CARDIAC CATHETERIZATION   PROCEDURE:  Cardiac catheterization.   CARDIOLOGIST:  Charlton Haws, M.D.   INDICATIONS:  Abnormal Cardiolite study with aortic insufficiency.   We previously planned to do a right and left heart catheterization, however,  we were unable to cannulate the patient's right femoral vein.  He had  significant pain in this area.  I suspect the vein was underneath a wisely  pulsatile femoral artery.  We decided to do just the left heart  catheterization with aortic root injection.   ANGIOGRAPHIC DATA:  The left main coronary artery was normal.   The left anterior descending coronary artery was normal.   The first and second obtuse marginal branches were normal.   The circumflex coronary artery was left dominant and it was normal.   The right coronary artery was small and nondominant.  It was normal.   VENTRICULOGRAPHY:  The RAO ventriculography showed mild global hypokinesis.  The ejection fraction was in the 50-55% range.   There was no mitral insufficiency.  Aortic injection showed mild aortic root  enlargement with angiographic grade 3 out of 4 aortic insufficiency.   PRESSURES:  Aortic pressure was in the 122/82 range.  LV pressure was in  121/12 range.   Again the patient had some groin tenderness during and after the case.  There was no obvious hematoma.   IMPRESSION:  I told Mr. [name] that he definitely has significant aortic  insufficiency.  He is a new patient to [name], and we will have to see how he  responds to ACE inhibitor therapy.  He is currently not very symptomatic and  despite the fact that he had some LV cavity dilatation, the natural history  of aortic insufficiency is a little more forgiving in regards to this.  We  will follow him serially.  I suspect in the next three months we may perform  a cardiac MRI to further assess LV size, volumes and regurgitant fraction.   SBE prophylaxis will be adhered to.  We will try to titrate his ACE  inhibitor up, and there may be a need for diuretic in the future.                                               Charlton Haws, M.D.    PN/MEDQ  D:  04/14/2003  T:  04/14/2003  Job:  [id number]

## 2011-03-29 NOTE — Assessment & Plan Note (Signed)
[facility]                            CARDIOLOGY OFFICE NOTE   NAME:[name] [name] GOETZKE                       MRN:          [id number]  DATE:12/25/2006                            DOB:          12/24/1945    [name] [name] returns today for followup.  He is doing well.  He had a rough  flight from [place] last night, and is feeling a little tired.   He has severe aortic insufficiency with good LV function.  He is due for  followup echo in July.  He is not having significant PND or orthopnea.  He is tolerating low-dose beta blockers now without nightmares.  He has  previously had problems with pain in his legs on Lipitor.  He is on  Zetia.  We had a discussion about the recent article in the [place]  Journal of Medicine.  Patient's LDL cholesterol runs about 130.   I told him I would rather have him off Zetia, and probably start 5 mg of  Crestor to see if he tolerates it.  He will think about this and talk to  Dr. Cato Mulligan when he sees him for his primary care followup.   REVIEW OF SYSTEMS:  Otherwise remarkable for some anxiety about flying.  He usually takes Ambien or Valium for this.   MEDICATIONS:  1. Zetia 10 mg a day.  2. Cozaar 100 a day.  3. Lopressor 12.5 mg a day.   PHYSICAL EXAMINATION:  He looks well.  Pulse pressure is actually not  widened today.  Blood pressure is 130/65.  HEENT:  Normal.  Carotids are prominent, but not bisferiens.  LUNGS:  Clear.  There is an S1, S2.  There is a systolic ejection murmur, and an aortic  insufficiency murmur.  PMI has increased.  ABDOMEN:  Benign.  LOWER EXTREMITIES:  Intact pulses, no edema.   IMPRESSION:  Severe aortic insufficiency, compensated LV function,  followup echo in June.  The patient has hyperlipidemia, and has had some  myalgias with Lipitor before.  He will stop his Zetia and probably start  5 of Crestor, and follow up with Dr. Cato Mulligan for this.   Overall, his functional status is excellent.   He is not does not have  any evidence of previous coronary artery disease.  His blood pressure is  well controlled on low-dose beta blocker and Cozaar.  I will see him  back in July when he has his stress test and when he has his echo.     Noralyn Pick. Eden Emms, MD, [facility]  Electronically Signed    PCN/MedQ  DD: 12/25/2006  DT: 12/25/2006  Job #: [id number]   cc:   Valetta Mole. Swords, MD

## 2011-04-01 ENCOUNTER — Ambulatory Visit (INDEPENDENT_AMBULATORY_CARE_PROVIDER_SITE_OTHER): Payer: 59 | Admitting: *Deleted

## 2011-04-01 DIAGNOSIS — I4891 Unspecified atrial fibrillation: Secondary | ICD-10-CM

## 2011-04-01 DIAGNOSIS — I2699 Other pulmonary embolism without acute cor pulmonale: Secondary | ICD-10-CM

## 2011-04-01 DIAGNOSIS — I82409 Acute embolism and thrombosis of unspecified deep veins of unspecified lower extremity: Secondary | ICD-10-CM

## 2011-04-01 LAB — POCT INR: INR: 1.5

## 2011-04-11 ENCOUNTER — Ambulatory Visit (INDEPENDENT_AMBULATORY_CARE_PROVIDER_SITE_OTHER): Payer: 59 | Admitting: Psychology

## 2011-04-11 ENCOUNTER — Other Ambulatory Visit (HOSPITAL_COMMUNITY)
Admission: RE | Admit: 2011-04-11 | Discharge: 2011-04-11 | Disposition: A | Discharge status: Home / Self Care | Payer: 59 | Source: Ambulatory Visit | Attending: Internal Medicine | Admitting: Internal Medicine

## 2011-04-11 ENCOUNTER — Other Ambulatory Visit: Payer: Self-pay | Admitting: Internal Medicine

## 2011-04-11 ENCOUNTER — Ambulatory Visit (INDEPENDENT_AMBULATORY_CARE_PROVIDER_SITE_OTHER): Payer: 59 | Admitting: Internal Medicine

## 2011-04-11 ENCOUNTER — Ambulatory Visit: Payer: 59 | Admitting: Psychology

## 2011-04-11 VITALS — BP 134/100 | HR 88 | Temp 98.4°F

## 2011-04-11 DIAGNOSIS — D72820 Lymphocytosis (symptomatic): Secondary | ICD-10-CM | POA: Insufficient documentation

## 2011-04-11 DIAGNOSIS — F4321 Adjustment disorder with depressed mood: Secondary | ICD-10-CM

## 2011-04-11 MED ORDER — TRAZODONE HCL 50 MG PO TABS: 25.0000 mg | ORAL_TABLET | Freq: Every day | ORAL | Status: DC

## 2011-04-11 NOTE — Progress Notes (Signed)
Addended by: Bonnye Fava on: 04/11/2011 09:27 AM   Modules accepted: Orders

## 2011-04-11 NOTE — Progress Notes (Signed)
  Subjective:    Patient ID: [name], male    DOB: 03-31-1946, 65 y.o.   MRN: [id number]  HPI    Review of Systems     Objective:   Physical Exam        Assessment & Plan:  Lymphocytosis Discussed at length --15 minutes all discussion and counselling

## 2011-04-15 ENCOUNTER — Encounter (INDEPENDENT_AMBULATORY_CARE_PROVIDER_SITE_OTHER): Payer: 59 | Admitting: Cardiology

## 2011-04-15 ENCOUNTER — Ambulatory Visit (INDEPENDENT_AMBULATORY_CARE_PROVIDER_SITE_OTHER): Payer: 59 | Admitting: *Deleted

## 2011-04-15 DIAGNOSIS — Z86718 Personal history of other venous thrombosis and embolism: Secondary | ICD-10-CM

## 2011-04-15 DIAGNOSIS — I82409 Acute embolism and thrombosis of unspecified deep veins of unspecified lower extremity: Secondary | ICD-10-CM

## 2011-04-15 DIAGNOSIS — I87009 Postthrombotic syndrome without complications of unspecified extremity: Secondary | ICD-10-CM

## 2011-04-15 DIAGNOSIS — I2699 Other pulmonary embolism without acute cor pulmonale: Secondary | ICD-10-CM

## 2011-04-15 DIAGNOSIS — I4891 Unspecified atrial fibrillation: Secondary | ICD-10-CM

## 2011-04-15 DIAGNOSIS — J811 Chronic pulmonary edema: Secondary | ICD-10-CM

## 2011-04-15 LAB — POCT INR: INR: 2

## 2011-04-16 ENCOUNTER — Other Ambulatory Visit: Payer: Self-pay | Admitting: Cardiovascular Disease

## 2011-04-17 ENCOUNTER — Other Ambulatory Visit: Payer: Self-pay | Admitting: Internal Medicine

## 2011-04-17 DIAGNOSIS — C9110 Chronic lymphocytic leukemia of B-cell type not having achieved remission: Secondary | ICD-10-CM

## 2011-04-18 ENCOUNTER — Encounter: Payer: Self-pay | Admitting: Cardiovascular Disease

## 2011-04-18 ENCOUNTER — Telehealth: Payer: Self-pay | Admitting: Cardiovascular Disease

## 2011-04-18 ENCOUNTER — Telehealth: Payer: Self-pay | Admitting: Internal Medicine

## 2011-04-18 NOTE — Telephone Encounter (Signed)
Spoke with pt, he is aware of lower ext dopplers Deliah Goody

## 2011-04-18 NOTE — Telephone Encounter (Signed)
Triage vm------------checking on a specialist appt. 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

## 2011-04-18 NOTE — Telephone Encounter (Signed)
Pt's lower arterial has Dr. Cato Mulligan as ordering MD but pt says it should be Dr. Eden Emms so he wants to talk to you about it

## 2011-04-19 NOTE — Telephone Encounter (Signed)
Pt informed, hematology referral has not been scheduled yet, brought to Terri's attention, asked her to avoid scheduling June 28 - July 12, pt going to [place]

## 2011-04-19 NOTE — Telephone Encounter (Signed)
Not from infection. Make sure hematology appt has been made

## 2011-04-25 ENCOUNTER — Ambulatory Visit (INDEPENDENT_AMBULATORY_CARE_PROVIDER_SITE_OTHER): Payer: 59 | Admitting: Psychology

## 2011-04-25 DIAGNOSIS — F4321 Adjustment disorder with depressed mood: Secondary | ICD-10-CM

## 2011-04-30 ENCOUNTER — Ambulatory Visit (INDEPENDENT_AMBULATORY_CARE_PROVIDER_SITE_OTHER): Payer: 59 | Admitting: *Deleted

## 2011-04-30 DIAGNOSIS — I82409 Acute embolism and thrombosis of unspecified deep veins of unspecified lower extremity: Secondary | ICD-10-CM

## 2011-04-30 DIAGNOSIS — I4891 Unspecified atrial fibrillation: Secondary | ICD-10-CM

## 2011-04-30 DIAGNOSIS — I2699 Other pulmonary embolism without acute cor pulmonale: Secondary | ICD-10-CM

## 2011-04-30 LAB — POCT INR: INR: 2.2

## 2011-05-01 ENCOUNTER — Other Ambulatory Visit: Payer: Self-pay | Admitting: Cardiovascular Disease

## 2011-05-06 ENCOUNTER — Encounter (HOSPITAL_BASED_OUTPATIENT_CLINIC_OR_DEPARTMENT_OTHER): Payer: 59 | Admitting: Oncology

## 2011-05-06 ENCOUNTER — Other Ambulatory Visit: Payer: Self-pay | Admitting: Oncology

## 2011-05-06 DIAGNOSIS — D72820 Lymphocytosis (symptomatic): Secondary | ICD-10-CM

## 2011-05-06 LAB — CBC & DIFF AND RETIC
BASO%: 0.2 % (ref 0.0–2.0)
Basophils Absolute: 0 10*3/uL (ref 0.0–0.1)
EOS%: 1.7 % (ref 0.0–7.0)
Eosinophils Absolute: 0.1 10*3/uL (ref 0.0–0.5)
HCT: 44.2 % (ref 38.4–49.9)
HGB: 15 g/dL (ref 13.0–17.1)
Immature Retic Fract: 1.9 % (ref 0.00–13.40)
LYMPH%: 61 % — ABNORMAL HIGH (ref 14.0–49.0)
MCH: 30.1 pg (ref 27.2–33.4)
MCHC: 33.9 g/dL (ref 32.0–36.0)
MCV: 88.8 fL (ref 79.3–98.0)
MONO#: 0.7 10*3/uL (ref 0.1–0.9)
MONO%: 7.9 % (ref 0.0–14.0)
NEUT#: 2.4 10*3/uL (ref 1.5–6.5)
NEUT%: 29.2 % — ABNORMAL LOW (ref 39.0–75.0)
Platelets: 213 10*3/uL (ref 140–400)
RBC: 4.98 10*6/uL (ref 4.20–5.82)
RDW: 13.5 % (ref 11.0–14.6)
Retic %: 1.04 % (ref 0.50–1.60)
Retic Ct Abs: 51.79 10*3/uL (ref 24.10–77.50)
WBC: 8.2 10*3/uL (ref 4.0–10.3)
lymph#: 5 10*3/uL — ABNORMAL HIGH (ref 0.9–3.3)

## 2011-05-06 LAB — MORPHOLOGY
PLT EST: ADEQUATE
RBC Comments: NORMAL

## 2011-05-06 LAB — CHCC SMEAR

## 2011-05-08 ENCOUNTER — Telehealth: Payer: Self-pay | Admitting: Internal Medicine

## 2011-05-08 NOTE — Telephone Encounter (Signed)
LMTCB

## 2011-05-08 NOTE — Telephone Encounter (Signed)
Pt called regarding his Effexor. States it is giving him migraines. He is leaving the country tomorrow and would like someone to call him today.

## 2011-05-09 ENCOUNTER — Ambulatory Visit: Payer: 59 | Admitting: Psychology

## 2011-05-09 LAB — DIRECT ANTIGLOBULIN TEST (NOT AT ARMC): DAT IgG: NEGATIVE

## 2011-05-09 LAB — IMMUNOFIXATION ELECTROPHORESIS
IgA: 195 mg/dL (ref 68–379)
IgG (Immunoglobin G), Serum: 999 mg/dL (ref 650–1600)
IgM, Serum: 171 mg/dL (ref 41–251)
Total Protein, Serum Electrophoresis: 7 g/dL (ref 6.0–8.3)

## 2011-05-09 NOTE — Telephone Encounter (Signed)
Pt has left the country but he will call if he needs anything

## 2011-05-10 LAB — KAPPA/LAMBDA LIGHT CHAINS
Kappa free light chain: 1.91 mg/dL (ref 0.33–1.94)
Kappa:Lambda Ratio: 1.11 (ref 0.26–1.65)
Lambda Free Lght Chn: 1.72 mg/dL (ref 0.57–2.63)

## 2011-05-23 ENCOUNTER — Ambulatory Visit: Payer: 59 | Admitting: Psychology

## 2011-05-28 ENCOUNTER — Encounter: Payer: 59 | Admitting: *Deleted

## 2011-05-28 ENCOUNTER — Ambulatory Visit (INDEPENDENT_AMBULATORY_CARE_PROVIDER_SITE_OTHER): Payer: 59 | Admitting: *Deleted

## 2011-05-28 DIAGNOSIS — I82409 Acute embolism and thrombosis of unspecified deep veins of unspecified lower extremity: Secondary | ICD-10-CM

## 2011-05-28 DIAGNOSIS — I4891 Unspecified atrial fibrillation: Secondary | ICD-10-CM

## 2011-05-28 DIAGNOSIS — I2699 Other pulmonary embolism without acute cor pulmonale: Secondary | ICD-10-CM

## 2011-05-28 LAB — POCT INR: INR: 2.8

## 2011-05-29 ENCOUNTER — Encounter: Payer: Self-pay | Admitting: Family Medicine

## 2011-05-29 ENCOUNTER — Ambulatory Visit (INDEPENDENT_AMBULATORY_CARE_PROVIDER_SITE_OTHER): Payer: 59 | Admitting: Family Medicine

## 2011-05-29 VITALS — BP 120/88 | Temp 98.7°F | Wt 200.0 lb

## 2011-05-29 DIAGNOSIS — R05 Cough: Secondary | ICD-10-CM

## 2011-05-29 DIAGNOSIS — J208 Acute bronchitis due to other specified organisms: Secondary | ICD-10-CM

## 2011-05-29 DIAGNOSIS — R059 Cough, unspecified: Secondary | ICD-10-CM

## 2011-05-29 MED ORDER — BENZONATATE 200 MG PO CAPS: 200.0000 mg | ORAL_CAPSULE | Freq: Three times a day (TID) | ORAL | Status: AC | PRN

## 2011-05-29 NOTE — Patient Instructions (Signed)
Follow up for any fever or worsening symptoms. 

## 2011-05-29 NOTE — Progress Notes (Signed)
  Subjective:    Patient ID: [name], male    DOB: 1945-11-21, 64 y.o.   MRN: [id number]  HPI Cough of one-week duration. The cough is dry. No associated fever, night sweats, chills, dyspnea, wheezing, postnasal drip, or GERD symptoms. Recently returned from [place]. No ill exposures. Quit smoking in 1982. Allergy to multiple things including codeine and hydrocodone. Has taken Mucinex DM without relief. Cough especially bothersome at night. Denies sore throat. No rashes. No headache   Review of Systems  Constitutional: Negative for fever, chills, activity change, appetite change, fatigue and unexpected weight change.  HENT: Negative for ear pain, congestion, sore throat and trouble swallowing.   Respiratory: Positive for cough. Negative for shortness of breath, wheezing and stridor.   Cardiovascular: Negative for chest pain and leg swelling.  Gastrointestinal: Negative for abdominal pain.  Musculoskeletal: Negative for arthralgias.  Skin: Negative for rash.  Neurological: Negative for syncope and headaches.  Hematological: Negative for adenopathy.       Objective:   Physical Exam  Constitutional: He is oriented to person, place, and time. He appears well-developed and well-nourished. No distress.  HENT:  Head: Normocephalic and atraumatic.  Mouth/Throat: Oropharynx is clear and moist.  Eyes: Pupils are equal, round, and reactive to light.  Neck: Normal range of motion. Neck supple. No thyromegaly present.  Cardiovascular: Normal rate and regular rhythm.   No murmur heard. Pulmonary/Chest: Effort normal. No stridor. No respiratory distress. He has no wheezes. He has no rales.  Abdominal: Soft. Bowel sounds are normal. There is no tenderness.  Musculoskeletal: He exhibits no edema.  Lymphadenopathy:    He has no cervical adenopathy.  Neurological: He is alert and oriented to person, place, and time.  Psychiatric: He has a normal mood and affect.          Assessment &  Plan:  Cough probably secondary to acute viral bronchitis. Tessalon Perles 200 mg every 8 hours as needed for cough suppression. Followup promptly for fever or change of symptoms

## 2011-06-06 ENCOUNTER — Telehealth: Payer: Self-pay | Admitting: Cardiovascular Disease

## 2011-06-06 ENCOUNTER — Ambulatory Visit (INDEPENDENT_AMBULATORY_CARE_PROVIDER_SITE_OTHER): Payer: 59 | Admitting: Psychology

## 2011-06-06 DIAGNOSIS — F4321 Adjustment disorder with depressed mood: Secondary | ICD-10-CM

## 2011-06-06 NOTE — Telephone Encounter (Signed)
Per pt calling , C/O large bruise on leg.

## 2011-06-06 NOTE — Telephone Encounter (Signed)
Spoke with pt, he is concerned about the bruising he has on the back of his leg. It is located above the knee and is a little tender to the touch. There is no swelling in the area and it is not warm to the touch. His INR has been therapeutic. He will cont to watch if no better by the first of the week may need to be scanned Deliah Goody .

## 2011-06-07 ENCOUNTER — Ambulatory Visit (INDEPENDENT_AMBULATORY_CARE_PROVIDER_SITE_OTHER): Payer: 59 | Admitting: *Deleted

## 2011-06-07 DIAGNOSIS — I2699 Other pulmonary embolism without acute cor pulmonale: Secondary | ICD-10-CM

## 2011-06-07 DIAGNOSIS — I4891 Unspecified atrial fibrillation: Secondary | ICD-10-CM

## 2011-06-07 DIAGNOSIS — I82409 Acute embolism and thrombosis of unspecified deep veins of unspecified lower extremity: Secondary | ICD-10-CM

## 2011-06-07 LAB — POCT INR: INR: 1.7

## 2011-06-12 ENCOUNTER — Other Ambulatory Visit: Payer: Self-pay | Admitting: *Deleted

## 2011-06-12 MED ORDER — LORAZEPAM 0.5 MG PO TABS: 0.5000 mg | ORAL_TABLET | Freq: Every day | ORAL | Status: DC | PRN

## 2011-06-18 ENCOUNTER — Ambulatory Visit (INDEPENDENT_AMBULATORY_CARE_PROVIDER_SITE_OTHER): Payer: 59 | Admitting: Family Medicine

## 2011-06-18 ENCOUNTER — Encounter: Payer: Self-pay | Admitting: Family Medicine

## 2011-06-18 VITALS — BP 130/94 | Temp 98.9°F | Wt 200.0 lb

## 2011-06-18 DIAGNOSIS — R42 Dizziness and giddiness: Secondary | ICD-10-CM

## 2011-06-18 DIAGNOSIS — R059 Cough, unspecified: Secondary | ICD-10-CM

## 2011-06-18 DIAGNOSIS — R05 Cough: Secondary | ICD-10-CM

## 2011-06-18 NOTE — Patient Instructions (Signed)
Try Mucinex DM for cough and follow up promptly for any fever or persistent cough.

## 2011-06-18 NOTE — Progress Notes (Signed)
  Subjective:    Patient ID: ELZIE KNISLEY, male    DOB: 29-Mar-1946, 65 y.o.   MRN: [id number]  HPI Patient seen with some persistent cough and dizziness. Nonsmoker. Recent cough and went to urgent care 21st of July and placed on Levaquin and albuterol inhaler. Cough improved briefly. Chest x-ray reportedly normal. Cough is dry. No hemoptysis. Denies any dyspnea or pleuritic pain. Patient intolerant of hydrocodone. Try Mucinex briefly but felt too dried out. Denies appetite or weight change.  Sunday he noticed a little bit of dizziness. Difficulty describing. Felt off balance at times. No true vertigo. No syncope. Denies any focal weakness or any visual changes or any other focal neurologic symptoms. Symptoms worse standing. No chest pain. Patient has significant anxiety and wondered if this was anxiety related   Review of Systems  Constitutional: Negative for fever and chills.  HENT: Negative for sore throat and postnasal drip.   Respiratory: Positive for cough. Negative for shortness of breath, wheezing and stridor.   Neurological: Positive for dizziness. Negative for syncope and headaches.       Objective:   Physical Exam  Constitutional: He is oriented to person, place, and time. He appears well-developed and well-nourished. No distress.  HENT:  Right Ear: External ear normal.  Left Ear: External ear normal.  Mouth/Throat: Oropharynx is clear and moist. No oropharyngeal exudate.  Eyes: Pupils are equal, round, and reactive to light.  Neck: Neck supple.  Cardiovascular: Normal rate, regular rhythm and normal heart sounds.   No murmur heard. Pulmonary/Chest: Effort normal and breath sounds normal. No respiratory distress. He has no wheezes. He has no rales.  Musculoskeletal: He exhibits no edema.  Lymphadenopathy:    He has no cervical adenopathy.  Neurological: He is alert and oriented to person, place, and time. No cranial nerve deficit.          Assessment & Plan:  #1  recent bronchitis, probably viral. Try Mucinex DM. No indication for further antibiotics at this time #2 nonspecific dizziness. Nonfocal neuro exam. Observe for now and follow up if recurs.

## 2011-06-20 ENCOUNTER — Ambulatory Visit (INDEPENDENT_AMBULATORY_CARE_PROVIDER_SITE_OTHER): Payer: 59 | Admitting: Psychology

## 2011-06-20 ENCOUNTER — Encounter: Payer: Self-pay | Admitting: Family Medicine

## 2011-06-20 ENCOUNTER — Ambulatory Visit (INDEPENDENT_AMBULATORY_CARE_PROVIDER_SITE_OTHER): Payer: 59 | Admitting: Family Medicine

## 2011-06-20 VITALS — BP 120/82 | Temp 98.5°F

## 2011-06-20 DIAGNOSIS — R05 Cough: Secondary | ICD-10-CM

## 2011-06-20 DIAGNOSIS — R059 Cough, unspecified: Secondary | ICD-10-CM

## 2011-06-20 DIAGNOSIS — F4321 Adjustment disorder with depressed mood: Secondary | ICD-10-CM

## 2011-06-20 MED ORDER — HYDROCODONE-HOMATROPINE 5-1.5 MG/5ML PO SYRP: 5.0000 mL | ORAL_SOLUTION | Freq: Four times a day (QID) | ORAL | Status: AC | PRN

## 2011-06-20 NOTE — Progress Notes (Signed)
  Subjective:    Patient ID: [name], male    DOB: 09-14-1946, 65 y.o.   MRN: [id number]  HPI Persistent cough. Refer to prior note. Nonsmoker. Cough bothers him especially at night. Nonproductive. Denies fever, chills, hemoptysis, pleuritic pain. No postnasal drip symptoms. No GERD symptoms. Chest x-ray 06/01/2011 at urgent care reportedly normal. He has had reported intolerance with multiple medications including hydrocodone but states that he has taken very low dose of half teaspoon hydrocodone cough syrup in the past without difficulty. He did not have a clear allergic reaction such as hives, shortness of breath or other concerning symptoms but states he felt somewhat disoriented. He is requesting refill of that today. Tried Tessalon and dextromethorphan without relief. Does travel quite often but denies any infectious symptoms as above   Review of Systems As per HPI    Objective:   Physical Exam  Constitutional: He appears well-developed and well-nourished.  Cardiovascular: Normal rate and regular rhythm.   Pulmonary/Chest: Effort normal and breath sounds normal. No respiratory distress. He has no wheezes. He has no rales.  Musculoskeletal: He exhibits no edema.          Assessment & Plan:  Persistent cough. May be all post viral bronchitis. Reviewed cautions with cough syrup and he specifically requests repeat trial of hydrocodone cough syrup. He has not had clear allergic reaction hydrocodone. Hycodan 1/2 teaspoon and immediately stop if he has any itching or other worrisome side effects.

## 2011-06-20 NOTE — Patient Instructions (Signed)
Be extremely cautious with cough syrup and if you have any itching, hives, or shortness or breath stop immediately.

## 2011-06-21 ENCOUNTER — Ambulatory Visit (INDEPENDENT_AMBULATORY_CARE_PROVIDER_SITE_OTHER): Payer: 59 | Admitting: *Deleted

## 2011-06-21 DIAGNOSIS — I4891 Unspecified atrial fibrillation: Secondary | ICD-10-CM

## 2011-06-21 DIAGNOSIS — I2699 Other pulmonary embolism without acute cor pulmonale: Secondary | ICD-10-CM

## 2011-06-21 DIAGNOSIS — I82409 Acute embolism and thrombosis of unspecified deep veins of unspecified lower extremity: Secondary | ICD-10-CM

## 2011-06-21 LAB — POCT INR: INR: 1.7

## 2011-06-25 ENCOUNTER — Other Ambulatory Visit (INDEPENDENT_AMBULATORY_CARE_PROVIDER_SITE_OTHER): Payer: 59

## 2011-06-25 DIAGNOSIS — C9110 Chronic lymphocytic leukemia of B-cell type not having achieved remission: Secondary | ICD-10-CM

## 2011-06-25 LAB — CBC WITH DIFFERENTIAL/PLATELET
Basophils Absolute: 0 10*3/uL (ref 0.0–0.1)
Basophils Relative: 0.6 % (ref 0.0–3.0)
Eosinophils Absolute: 0.2 10*3/uL (ref 0.0–0.7)
Eosinophils Relative: 2.8 % (ref 0.0–5.0)
HCT: 44 % (ref 39.0–52.0)
Hemoglobin: 14.6 g/dL (ref 13.0–17.0)
Lymphocytes Relative: 68.2 % — ABNORMAL HIGH (ref 12.0–46.0)
Lymphs Abs: 4.2 10*3/uL — ABNORMAL HIGH (ref 0.7–4.0)
MCHC: 33.3 g/dL (ref 30.0–36.0)
MCV: 93.5 fl (ref 78.0–100.0)
Monocytes Absolute: 0.5 10*3/uL (ref 0.1–1.0)
Monocytes Relative: 8.5 % (ref 3.0–12.0)
Neutro Abs: 1.2 10*3/uL — ABNORMAL LOW (ref 1.4–7.7)
Neutrophils Relative %: 19.9 % — ABNORMAL LOW (ref 43.0–77.0)
Platelets: 157 10*3/uL (ref 150.0–400.0)
RBC: 4.7 Mil/uL (ref 4.22–5.81)
RDW: 14.3 % (ref 11.5–14.6)
WBC: 6.2 10*3/uL (ref 4.5–10.5)

## 2011-06-25 LAB — BASIC METABOLIC PANEL
BUN: 18 mg/dL (ref 6–23)
CO2: 30 mEq/L (ref 19–32)
Calcium: 9.3 mg/dL (ref 8.4–10.5)
Chloride: 107 mEq/L (ref 96–112)
Creatinine, Ser: 1.3 mg/dL (ref 0.4–1.5)
GFR: 69.32 mL/min (ref >60.00)
Glucose, Bld: 93 mg/dL (ref 70–99)
Potassium: 4.8 mEq/L (ref 3.5–5.1)
Sodium: 144 mEq/L (ref 135–145)

## 2011-06-25 LAB — LIPID PANEL
Cholesterol: 251 mg/dL — ABNORMAL HIGH (ref 0–200)
HDL: 49.7 mg/dL
Total CHOL/HDL Ratio: 5
Triglycerides: 117 mg/dL (ref 0.0–149.0)
VLDL: 23.4 mg/dL (ref 0.0–40.0)

## 2011-06-25 LAB — HEPATIC FUNCTION PANEL
ALT: 39 U/L (ref 0–53)
AST: 25 U/L (ref 0–37)
Albumin: 4.1 g/dL (ref 3.5–5.2)
Alkaline Phosphatase: 54 U/L (ref 39–117)
Bilirubin, Direct: 0 mg/dL (ref 0.0–0.3)
Total Bilirubin: 0.6 mg/dL (ref 0.3–1.2)
Total Protein: 6.6 g/dL (ref 6.0–8.3)

## 2011-06-25 LAB — LDL CHOLESTEROL, DIRECT: Direct LDL: 174.2 mg/dL

## 2011-07-02 ENCOUNTER — Encounter: Payer: Self-pay | Admitting: Internal Medicine

## 2011-07-02 ENCOUNTER — Ambulatory Visit (INDEPENDENT_AMBULATORY_CARE_PROVIDER_SITE_OTHER): Payer: 59 | Admitting: Internal Medicine

## 2011-07-02 VITALS — BP 124/82 | HR 72 | Temp 98.0°F | Ht 70.0 in | Wt 199.0 lb

## 2011-07-02 DIAGNOSIS — I359 Nonrheumatic aortic valve disorder, unspecified: Secondary | ICD-10-CM

## 2011-07-02 DIAGNOSIS — E785 Hyperlipidemia, unspecified: Secondary | ICD-10-CM

## 2011-07-02 DIAGNOSIS — I82409 Acute embolism and thrombosis of unspecified deep veins of unspecified lower extremity: Secondary | ICD-10-CM

## 2011-07-02 DIAGNOSIS — I2699 Other pulmonary embolism without acute cor pulmonale: Secondary | ICD-10-CM

## 2011-07-02 DIAGNOSIS — I4891 Unspecified atrial fibrillation: Secondary | ICD-10-CM

## 2011-07-02 NOTE — Assessment & Plan Note (Signed)
Monthly protimes

## 2011-07-02 NOTE — Progress Notes (Signed)
  Subjective:    Patient ID: ISAMI MEHRA, male    DOB: 1946-07-13, 65 y.o.   MRN: [id number]  HPI  Patient Active Problem List  Diagnoses  . HYPERCHOLESTEROLEMIA---has not been compliant with meds  .   Marland Kitchen ANXIETY---doing well on med  .   .   . HYPERTENSION--no home BPs---  . PULMONARY EMBOLISM---no known recurrence  . AORTIC REGURGITATION, MODERATE---s/p AVR  . ATRIAL FIBRILLATION--no known recurrence  .   .   .   Marland Kitchen PROSTATE CANCER, HX OF---no known recurrence  .    Past Medical History  Diagnosis Date  . Hypertension   . Hyperlipidemia   . Atrial fibrillation   . Mitral regurgitation   . Hypercholesteremia   . Insomnia   . Anxiety   . Aortic regurgitation   . History of prostate cancer   . Anticoagulants causing adverse effect in therapeutic use    Past Surgical History  Procedure Date  . Cardiac catheterization   . Penile prosthesis implant   . Prostate surgery   . Cataract extraction     L 12/06/09   R 09/14/09  . Aortic valve replacement 2011    [facility]     reports that he quit smoking about 30 years ago. His smoking use included Cigarettes. He smoked 1 pack per day. He has never used smokeless tobacco. He reports that he does not drink alcohol or use illicit drugs. family history includes Cancer in his sister and unspecified family member; Hyperlipidemia in his other; and Hypertension in his other. Allergies  Allergen Reactions  . Aspirin     Can't take high dose  . Atorvastatin     REACTION: myalgia  . Buspirone Hcl     REACTION: headache  . Codeine Phosphate     REACTION: upset stomach, itching  . Hydrocodone-Acetaminophen   . Morphine Sulfate     REACTION: itching  . Oxycodone Hcl   . Rabeprazole     REACTION: headache  . Ramipril     REACTION: unspecified  . Telmisartan-Hctz     REACTION: sob,dizzy,palpitations      Review of Systems     Objective:   Physical Exam  well-developed well-nourished male in no acute distress. HEENT exam  atraumatic, normocephalic, neck supple without jugular venous distention. Chest clear to auscultation cardiac exam S1-S2 are regular 2/6 short SEM. Abdominal exam overweight with bowel sounds, soft and nontender. Extremities no edema. Neurologic exam is alert with a normal gait.    Assessment & Plan:

## 2011-07-04 ENCOUNTER — Ambulatory Visit (INDEPENDENT_AMBULATORY_CARE_PROVIDER_SITE_OTHER): Payer: 59 | Admitting: Psychology

## 2011-07-04 DIAGNOSIS — F4321 Adjustment disorder with depressed mood: Secondary | ICD-10-CM

## 2011-07-05 ENCOUNTER — Ambulatory Visit (INDEPENDENT_AMBULATORY_CARE_PROVIDER_SITE_OTHER): Payer: 59 | Admitting: *Deleted

## 2011-07-05 DIAGNOSIS — I4891 Unspecified atrial fibrillation: Secondary | ICD-10-CM

## 2011-07-05 DIAGNOSIS — I82409 Acute embolism and thrombosis of unspecified deep veins of unspecified lower extremity: Secondary | ICD-10-CM

## 2011-07-05 DIAGNOSIS — I2699 Other pulmonary embolism without acute cor pulmonale: Secondary | ICD-10-CM

## 2011-07-05 LAB — POCT INR: INR: 2

## 2011-07-11 NOTE — Assessment & Plan Note (Signed)
Continue on warfarin

## 2011-07-11 NOTE — Assessment & Plan Note (Signed)
No recurrence. 

## 2011-07-11 NOTE — Assessment & Plan Note (Signed)
No sxs Followed by cardiology

## 2011-07-11 NOTE — Assessment & Plan Note (Signed)
Discussed at length Needs treatment Start statin, side effects discussed

## 2011-07-16 ENCOUNTER — Other Ambulatory Visit: Payer: Self-pay | Admitting: Cardiovascular Disease

## 2011-07-16 ENCOUNTER — Other Ambulatory Visit: Payer: Self-pay | Admitting: Internal Medicine

## 2011-07-17 ENCOUNTER — Encounter: Payer: Self-pay | Admitting: Cardiovascular Disease

## 2011-07-17 ENCOUNTER — Ambulatory Visit (INDEPENDENT_AMBULATORY_CARE_PROVIDER_SITE_OTHER): Payer: 59 | Admitting: Cardiovascular Disease

## 2011-07-17 ENCOUNTER — Ambulatory Visit (INDEPENDENT_AMBULATORY_CARE_PROVIDER_SITE_OTHER): Payer: 59 | Admitting: *Deleted

## 2011-07-17 DIAGNOSIS — I2699 Other pulmonary embolism without acute cor pulmonale: Secondary | ICD-10-CM

## 2011-07-17 DIAGNOSIS — I359 Nonrheumatic aortic valve disorder, unspecified: Secondary | ICD-10-CM

## 2011-07-17 DIAGNOSIS — I10 Essential (primary) hypertension: Secondary | ICD-10-CM

## 2011-07-17 DIAGNOSIS — I4891 Unspecified atrial fibrillation: Secondary | ICD-10-CM

## 2011-07-17 DIAGNOSIS — I82409 Acute embolism and thrombosis of unspecified deep veins of unspecified lower extremity: Secondary | ICD-10-CM

## 2011-07-17 DIAGNOSIS — E785 Hyperlipidemia, unspecified: Secondary | ICD-10-CM

## 2011-07-17 LAB — POCT INR: INR: 2.4

## 2011-07-17 NOTE — Assessment & Plan Note (Signed)
Maint NSR with no palpitations  

## 2011-07-17 NOTE — Progress Notes (Signed)
[name] is seen for F/U of anticoagulation post AVR, history of PAF. DVT with large bilateral PE 9/11. F/U CT and LE [name] with resolving thrombus burden 09/18/10. Fairly long period of subRx anticoagulation on 2 separate courses of Lovenox. INR now Rx. Still some dyspnea and fatigue. No SSCP, palpitations, edema. No bleeding problems. Duplex done 01/18/11 showed Some partial nonoccluding clot in SFA and proximal popliteal. He really wants to come off coumadin.  He will finish a year of anticoagulation at the end of this month and has good resolution of thrombus burden by imaging.  He still flies a lot.  He has a long flight to [place] in October.  After discussion with [name] our Pharm PHD I discussed prophylactic [name] with [name].  He will take one 20mg  tab the night before and the night after his flights to help prevent recurrent DVT.    ROS: Denies fever, malais, weight loss, blurry vision, decreased visual acuity, cough, sputum, SOB, hemoptysis, pleuritic pain, palpitaitons, heartburn, abdominal pain, melena, lower extremity edema, claudication, or rash.  All other systems reviewed and negative  General: Affect appropriate Healthy:  appears stated age HEENT: normal Neck supple with no adenopathy JVP normal no bruits no thyromegaly Lungs clear with no wheezing and good diaphragmatic motion Heart:  S1/S2 SEM through tissue AVR no AR murmur,rub, gallop or click PMI normal Abdomen: benighn, BS positve, no tenderness, no AAA no bruit.  No HSM or HJR Distal pulses intact with no bruits No edema Neuro non-focal Skin warm and dry No muscular weakness   Current Outpatient Prescriptions  Medication Sig Dispense Refill  . aspirin 81 MG tablet Take 81 mg by mouth daily.        Marland Kitchen gabapentin (NEURONTIN) 300 MG capsule Half tab daily      . LORazepam (ATIVAN) 0.5 MG tablet Take 1 tablet (0.5 mg total) by mouth daily as needed.  30 tablet  3  . metoprolol succinate (TOPROL-XL) 25 MG 24 hr tablet TAKE 1  TABLET BY MOUTH EVERY DAY  30 tablet  2  . Omega-3 Fatty Acids (FISH OIL) 1000 MG CAPS Take by mouth daily.        Marland Kitchen PENNSAID 1.5 % SOLN as needed.       . simvastatin (ZOCOR) 20 MG tablet Take 1 tablet (20 mg total) by mouth every evening.  90 tablet  3  . traZODone (DESYREL) 50 MG tablet Take 1 tablet (50 mg total) by mouth at bedtime.      Marland Kitchen venlafaxine (EFFEXOR-XR) 75 MG 24 hr capsule TAKE ONE CAPSULE BY MOUTH EVERY DAY  30 capsule  3  . warfarin (COUMADIN) 5 MG tablet USE AS DIRECTED BY ANTICOAGULATION CLINIC  80 tablet  1    Allergies  Aspirin; Atorvastatin; Buspirone hcl; Codeine phosphate; Hydrocodone-acetaminophen; Morphine sulfate; Oxycodone hcl; Rabeprazole; Ramipril; and Telmisartan-hctz  Electrocardiogram:  Assessment and Plan

## 2011-07-17 NOTE — Assessment & Plan Note (Signed)
Stop coumadin after one year of anticoagulation.  Xeralto prophylacily around flights.  Samples given

## 2011-07-17 NOTE — Patient Instructions (Signed)
Your physician recommends that you schedule a follow-up appointment in: 6 MONTHS WITH DR NISHAN  Your physician recommends that you continue on your current medications as directed. Please refer to the Current Medication list given to you today. 

## 2011-07-17 NOTE — Assessment & Plan Note (Signed)
Well controlled.  Continue current medications and low sodium Dash type diet.    

## 2011-07-17 NOTE — Assessment & Plan Note (Signed)
S/P tissue AVR. Normal function  SBE prophylaxis.  Echo in a year

## 2011-07-17 NOTE — Assessment & Plan Note (Signed)
Cholesterol is at goal.  Continue current dose of statin and diet Rx.  No myalgias or side effects.  F/U  LFT's in 6 months. Lab Results  Component Value Date   LDLCALC  Value: 161        Total Cholesterol/HDL:CHD Risk Coronary Heart Disease Risk Table                     Men   Women  1/2 Average Risk   3.4   3.3  Average Risk       5.0   4.4  2 X Average Risk   9.6   7.1  3 X Average Risk  23.4   11.0        Use the calculated Patient Ratio above and the CHD Risk Table to determine the patient's CHD Risk.        ATP III CLASSIFICATION (LDL):  <100     mg/dL   Optimal  161-096  mg/dL   Near or Above                    Optimal  130-159  mg/dL   Borderline  045-409  mg/dL   High  >811     mg/dL   Very High* 07/12/4781

## 2011-07-19 ENCOUNTER — Ambulatory Visit (INDEPENDENT_AMBULATORY_CARE_PROVIDER_SITE_OTHER): Payer: 59 | Admitting: Psychology

## 2011-07-19 DIAGNOSIS — F4321 Adjustment disorder with depressed mood: Secondary | ICD-10-CM

## 2011-07-23 ENCOUNTER — Other Ambulatory Visit: Payer: Self-pay | Admitting: Internal Medicine

## 2011-07-24 ENCOUNTER — Other Ambulatory Visit: Payer: Self-pay | Admitting: Cardiovascular Disease

## 2011-08-01 ENCOUNTER — Ambulatory Visit (INDEPENDENT_AMBULATORY_CARE_PROVIDER_SITE_OTHER): Payer: 59 | Admitting: Psychology

## 2011-08-01 DIAGNOSIS — F4321 Adjustment disorder with depressed mood: Secondary | ICD-10-CM

## 2011-08-05 ENCOUNTER — Other Ambulatory Visit: Payer: Self-pay | Admitting: Internal Medicine

## 2011-08-05 DIAGNOSIS — E785 Hyperlipidemia, unspecified: Secondary | ICD-10-CM

## 2011-08-07 ENCOUNTER — Other Ambulatory Visit: Payer: 59

## 2011-08-12 ENCOUNTER — Telehealth: Payer: Self-pay | Admitting: *Deleted

## 2011-08-12 NOTE — Telephone Encounter (Signed)
Pt states he stopped his Coumadin, and wants to know if he will have any side effects???

## 2011-08-13 NOTE — Telephone Encounter (Signed)
Should not have any side effects

## 2011-08-13 NOTE — Telephone Encounter (Signed)
Given pt Dr. Marliss Coots recommendations.

## 2011-08-14 ENCOUNTER — Ambulatory Visit (INDEPENDENT_AMBULATORY_CARE_PROVIDER_SITE_OTHER): Payer: 59 | Admitting: Psychology

## 2011-08-14 DIAGNOSIS — F4321 Adjustment disorder with depressed mood: Secondary | ICD-10-CM

## 2011-08-16 ENCOUNTER — Ambulatory Visit (INDEPENDENT_AMBULATORY_CARE_PROVIDER_SITE_OTHER): Payer: 59 | Admitting: Internal Medicine

## 2011-08-16 ENCOUNTER — Encounter: Payer: Self-pay | Admitting: Internal Medicine

## 2011-08-16 VITALS — BP 124/82 | HR 76 | Temp 98.3°F | Ht 70.0 in | Wt 197.0 lb

## 2011-08-16 DIAGNOSIS — Z23 Encounter for immunization: Secondary | ICD-10-CM

## 2011-08-16 DIAGNOSIS — Z2911 Encounter for prophylactic immunotherapy for respiratory syncytial virus (RSV): Secondary | ICD-10-CM

## 2011-08-16 DIAGNOSIS — I2699 Other pulmonary embolism without acute cor pulmonale: Secondary | ICD-10-CM

## 2011-08-16 DIAGNOSIS — E785 Hyperlipidemia, unspecified: Secondary | ICD-10-CM

## 2011-08-16 LAB — COMPREHENSIVE METABOLIC PANEL
ALT: 34
ALT: 36
AST: 20
AST: 24
Albumin: 3.4 — ABNORMAL LOW
Albumin: 3.8
Alkaline Phosphatase: 51
Alkaline Phosphatase: 66
BUN: 18
BUN: 21
CO2: 27
CO2: 27
Calcium: 8.7
Calcium: 8.9
Chloride: 101
Chloride: 104
Creatinine, Ser: 1.19
Creatinine, Ser: 1.32
GFR calc Af Amer: >60
GFR calc Af Amer: >60
GFR calc non Af Amer: 55 — ABNORMAL LOW
GFR calc non Af Amer: >60
Glucose, Bld: 104 — ABNORMAL HIGH
Glucose, Bld: 94
Potassium: 3.8
Potassium: 4.1
Sodium: 134 — ABNORMAL LOW
Sodium: 136
Total Bilirubin: 0.7
Total Bilirubin: 0.8
Total Protein: 5.8 — ABNORMAL LOW
Total Protein: 6.7

## 2011-08-16 LAB — POCT CARDIAC MARKERS
CKMB, poc: <1 — ABNORMAL LOW
Myoglobin, poc: 49.5
Operator id: 265201
Troponin i, poc: <0.05

## 2011-08-16 LAB — I-STAT 8, (EC8 V) (CONVERTED LAB)
Acid-Base Excess: 3 — ABNORMAL HIGH
BUN: 25 — ABNORMAL HIGH
Bicarbonate: 28.3 — ABNORMAL HIGH
Chloride: 103
Glucose, Bld: 102 — ABNORMAL HIGH
HCT: 45
Hemoglobin: 15.3
Operator id: 265201
Potassium: 3.9
Sodium: 137
TCO2: 30
pCO2, Ven: 46.7
pH, Ven: 7.39 — ABNORMAL HIGH

## 2011-08-16 LAB — HEPATIC FUNCTION PANEL
ALT: 29 U/L (ref 0–53)
AST: 21 U/L (ref 0–37)
Albumin: 4.4 g/dL (ref 3.5–5.2)
Alkaline Phosphatase: 54 U/L (ref 39–117)
Bilirubin, Direct: 0 mg/dL (ref 0.0–0.3)
Total Bilirubin: 1.1 mg/dL (ref 0.3–1.2)
Total Protein: 7.1 g/dL (ref 6.0–8.3)

## 2011-08-16 LAB — CK TOTAL AND CKMB (NOT AT ARMC)
CK, MB: 1.9
Relative Index: INVALID
Total CK: 97

## 2011-08-16 LAB — DIFFERENTIAL
Basophils Absolute: 0.1
Basophils Relative: 2 — ABNORMAL HIGH
Eosinophils Absolute: 0.2
Eosinophils Relative: 2
Lymphocytes Relative: 54 — ABNORMAL HIGH
Lymphs Abs: 3.9
Monocytes Absolute: 0.8
Monocytes Relative: 11
Neutro Abs: 2.3
Neutrophils Relative %: 32 — ABNORMAL LOW

## 2011-08-16 LAB — LIPID PANEL
Cholesterol: 239 mg/dL — ABNORMAL HIGH (ref 0–200)
HDL: 47.7 mg/dL (ref >39.00)
Total CHOL/HDL Ratio: 5
Triglycerides: 135 mg/dL (ref 0.0–149.0)
VLDL: 27 mg/dL (ref 0.0–40.0)

## 2011-08-16 LAB — CBC
HCT: 37.7 — ABNORMAL LOW
HCT: 42.8
Hemoglobin: 13.1
Hemoglobin: 14.7
MCHC: 34.4
MCHC: 34.8
MCV: 90.4
MCV: 90.7
Platelets: 222
Platelets: 261
RBC: 4.16 — ABNORMAL LOW
RBC: 4.74
RDW: 12.7
RDW: 13.1
WBC: 7
WBC: 7.2

## 2011-08-16 LAB — LDL CHOLESTEROL, DIRECT: Direct LDL: 156.2 mg/dL

## 2011-08-16 LAB — TSH: TSH: 1.6

## 2011-08-16 LAB — URINALYSIS, ROUTINE W REFLEX MICROSCOPIC
Bilirubin Urine: NEGATIVE
Glucose, UA: NEGATIVE
Hgb urine dipstick: NEGATIVE
Ketones, ur: 15 — AB
Nitrite: NEGATIVE
Protein, ur: NEGATIVE
Specific Gravity, Urine: 1.029
Urobilinogen, UA: 1
pH: 6

## 2011-08-16 LAB — POCT I-STAT CREATININE
Creatinine, Ser: 1.4
Operator id: 265201

## 2011-08-16 LAB — D-DIMER, QUANTITATIVE: D-Dimer, Quant: <0.22

## 2011-08-16 LAB — B-NATRIURETIC PEPTIDE (CONVERTED LAB): Pro B Natriuretic peptide (BNP): <30

## 2011-08-18 NOTE — Assessment & Plan Note (Signed)
States that he is taking simvastatin. We'll check laboratory work today.

## 2011-08-18 NOTE — Assessment & Plan Note (Signed)
hAs recently as talked warfarin.

## 2011-08-18 NOTE — Progress Notes (Signed)
  Subjective:    Patient ID: [name], male    DOB: 11/06/1946, 65 y.o.   MRN: [id number]  HPI Patient comes in for evaluation. He is feeling well. He has no specific complaints. He is tolerating his medications. Patient does have a history of a pulmonary embolus and atrial fibrillation. Atrial fibrillation was thought to be related to heart surgery and had not recurred. Patient has been on anticoagulation for pulmonary embolism for a year and has recently discontinued. Patient also has hypertension and hyperlipidemia. Tolerating his medications without difficulty. I have reviewed the cardiology notes.  Past Medical History  Diagnosis Date  . Hypertension   . Hyperlipidemia   . Atrial fibrillation   . Mitral regurgitation   . Hypercholesteremia   . Insomnia   . Anxiety   . Aortic regurgitation   . History of prostate cancer   . Anticoagulants causing adverse effect in therapeutic use    Past Surgical History  Procedure Date  . Cardiac catheterization   . Penile prosthesis implant   . Prostate surgery   . Cataract extraction     L 12/06/09   R 09/14/09  . Aortic valve replacement 2011    [facility]     reports that he quit smoking about 30 years ago. His smoking use included Cigarettes. He smoked 1 pack per day. He has never used smokeless tobacco. He reports that he does not drink alcohol or use illicit drugs. family history includes Cancer in his sister and unspecified family member; Hyperlipidemia in his other; and Hypertension in his other. Allergies  Allergen Reactions  . Aspirin     Can't take high dose  . Atorvastatin     REACTION: myalgia  . Buspirone Hcl     REACTION: headache  . Codeine Phosphate     REACTION: upset stomach, itching  . Hydrocodone-Acetaminophen   . Morphine Sulfate     REACTION: itching  . Oxycodone Hcl   . Rabeprazole     REACTION: headache  . Ramipril     REACTION: unspecified  . Telmisartan-Hctz     REACTION: sob,dizzy,palpitations       Review of Systems  patient denies chest pain, shortness of breath, orthopnea. Denies lower extremity edema, abdominal pain, change in appetite, change in bowel movements. Patient denies rashes, musculoskeletal complaints. No other specific complaints in a complete review of systems.      Objective:   Physical Exam   well-developed well-nourished male in no acute distress. HEENT exam atraumatic, normocephalic, neck supple without jugular venous distention. Chest clear to auscultation cardiac exam S1-S2 are regular. Abdominal exam overweight with bowel sounds, soft and nontender. Extremities no edema. Neurologic exam is alert with a normal gait.       Assessment & Plan:

## 2011-08-19 LAB — URINALYSIS, ROUTINE W REFLEX MICROSCOPIC
Bilirubin Urine: NEGATIVE
Glucose, UA: NEGATIVE
Hgb urine dipstick: NEGATIVE
Ketones, ur: NEGATIVE
Nitrite: NEGATIVE
Protein, ur: NEGATIVE
Specific Gravity, Urine: 1.02
Urobilinogen, UA: 0.2
pH: 5.5

## 2011-08-19 LAB — COMPREHENSIVE METABOLIC PANEL
ALT: 48
AST: 33
Albumin: 3.7
Alkaline Phosphatase: 60
BUN: 19
CO2: 25
Calcium: 9
Chloride: 108
Creatinine, Ser: 1.32
GFR calc Af Amer: >60
GFR calc non Af Amer: 55 — ABNORMAL LOW
Glucose, Bld: 96
Potassium: 3.9
Sodium: 140
Total Bilirubin: 0.5
Total Protein: 6.1

## 2011-08-19 LAB — CULTURE, BLOOD (ROUTINE X 2)
Culture: NO GROWTH
Culture: NO GROWTH

## 2011-08-19 LAB — DIFFERENTIAL
Basophils Absolute: 0
Basophils Relative: 0
Eosinophils Absolute: 0 — ABNORMAL LOW
Eosinophils Relative: 1
Lymphocytes Relative: 25
Lymphs Abs: 1.5
Monocytes Absolute: 0.1
Monocytes Relative: 2 — ABNORMAL LOW
Neutro Abs: 4.3
Neutrophils Relative %: 73

## 2011-08-19 LAB — CBC
HCT: 37.5 — ABNORMAL LOW
Hemoglobin: 13.3
MCHC: 35.4
MCV: 88
Platelets: 180
RBC: 4.26
RDW: 13.3
WBC: 6

## 2011-08-23 ENCOUNTER — Other Ambulatory Visit: Payer: Self-pay | Admitting: *Deleted

## 2011-08-23 MED ORDER — PITAVASTATIN CALCIUM 2 MG PO TABS: ORAL_TABLET | ORAL | Status: DC

## 2011-08-26 ENCOUNTER — Telehealth: Payer: Self-pay | Admitting: *Deleted

## 2011-08-26 NOTE — Telephone Encounter (Signed)
Insurance won't cover Livalo and wants to know if Dr Cato Mulligan would prescribe something else

## 2011-08-27 MED ORDER — PITAVASTATIN CALCIUM 2 MG PO TABS: ORAL_TABLET | ORAL | Status: DC

## 2011-08-27 NOTE — Telephone Encounter (Signed)
He is taking such a low dose that cost should not be a huge deal. He has failed all other statins.  livalo 2 mg 1/2 tab po every other day (7 pills will last one month)

## 2011-08-27 NOTE — Telephone Encounter (Signed)
Pt is aware of Dr. Marliss Coots suggestion. Pt states he was told a coupon was available for him. Pt would like a call back because he will be out of the country all next week and the following week.

## 2011-08-27 NOTE — Telephone Encounter (Signed)
Left detailed message on pt's personal voice mail.

## 2011-09-02 ENCOUNTER — Telehealth: Payer: Self-pay | Admitting: Cardiovascular Disease

## 2011-09-02 NOTE — Telephone Encounter (Signed)
Pt returning your call

## 2011-09-02 NOTE — Telephone Encounter (Signed)
Pt calling re xarelto, pt will be flying in the am on wednesday 1130a for one hour, will wait until 5pm, then will fly for 7 hours, pt to take when flying, but questioning the fact he wil be on two flights so not sure what time to take it as to how long it will last

## 2011-09-02 NOTE — Telephone Encounter (Signed)
Left message to call back  

## 2011-09-02 NOTE — Telephone Encounter (Signed)
I spoke with the pt and he said that Dr Eden Emms gave him Xarelto to take when flying.  The pt asked when he needs to take his Xarelto since he will be on two different flights.  I instructed the pt that he should take the Xarelto prior to the seven hour flight.

## 2011-09-12 ENCOUNTER — Ambulatory Visit: Payer: 59 | Admitting: Psychology

## 2011-09-12 ENCOUNTER — Telehealth: Payer: Self-pay | Admitting: *Deleted

## 2011-09-12 NOTE — Telephone Encounter (Signed)
LMTCB

## 2011-09-12 NOTE — Telephone Encounter (Signed)
yes

## 2011-09-12 NOTE — Telephone Encounter (Signed)
[name], please call this pt about his Xarelto that he takes when flying.

## 2011-09-12 NOTE — Telephone Encounter (Signed)
Pt's flight was postponed to 6 pm and he has already taken the xarelto and wants to know if he could take again before his flight.

## 2011-09-17 ENCOUNTER — Ambulatory Visit (INDEPENDENT_AMBULATORY_CARE_PROVIDER_SITE_OTHER): Payer: 59 | Admitting: Family Medicine

## 2011-09-17 ENCOUNTER — Encounter: Payer: Self-pay | Admitting: Family Medicine

## 2011-09-17 VITALS — BP 126/88 | HR 80 | Temp 98.5°F | Wt 199.0 lb

## 2011-09-17 DIAGNOSIS — H6980 Other specified disorders of Eustachian tube, unspecified ear: Secondary | ICD-10-CM

## 2011-09-17 NOTE — Progress Notes (Signed)
  Subjective:    Patient ID: [name], male    DOB: 04-21-1946, 65 y.o.   MRN: [id number]  HPI Here for 5 days of sinus pressure, HAs, and ear pain. No fever or ST or cough.    Review of Systems  Constitutional: Negative.   HENT: Positive for congestion. Negative for postnasal drip.   Eyes: Negative.   Respiratory: Negative.   Neurological: Positive for headaches.       Objective:   Physical Exam  Constitutional: He appears well-developed and well-nourished.  HENT:  Right Ear: External ear normal.  Left Ear: External ear normal.  Nose: Nose normal.  Mouth/Throat: Oropharynx is clear and moist. No oropharyngeal exudate.  Eyes: Conjunctivae are normal. Pupils are equal, round, and reactive to light.  Neck: No thyromegaly present.  Pulmonary/Chest: Effort normal and breath sounds normal.  Lymphadenopathy:    He has no cervical adenopathy.          Assessment & Plan:  Try Claritin and Mucinex

## 2011-09-26 ENCOUNTER — Ambulatory Visit (INDEPENDENT_AMBULATORY_CARE_PROVIDER_SITE_OTHER): Payer: 59 | Admitting: Psychology

## 2011-09-26 DIAGNOSIS — F4321 Adjustment disorder with depressed mood: Secondary | ICD-10-CM

## 2011-10-02 ENCOUNTER — Other Ambulatory Visit: Payer: Self-pay | Admitting: *Deleted

## 2011-10-02 MED ORDER — LORAZEPAM 0.5 MG PO TABS: 0.5000 mg | ORAL_TABLET | Freq: Every day | ORAL | Status: DC | PRN

## 2011-10-10 ENCOUNTER — Ambulatory Visit (INDEPENDENT_AMBULATORY_CARE_PROVIDER_SITE_OTHER): Payer: 59 | Admitting: Psychology

## 2011-10-10 DIAGNOSIS — F4321 Adjustment disorder with depressed mood: Secondary | ICD-10-CM

## 2011-10-17 ENCOUNTER — Other Ambulatory Visit: Payer: Self-pay | Admitting: Internal Medicine

## 2011-10-24 ENCOUNTER — Ambulatory Visit: Payer: 59 | Admitting: Psychology

## 2011-11-07 ENCOUNTER — Ambulatory Visit (INDEPENDENT_AMBULATORY_CARE_PROVIDER_SITE_OTHER): Payer: 59 | Admitting: Psychology

## 2011-11-07 DIAGNOSIS — F4321 Adjustment disorder with depressed mood: Secondary | ICD-10-CM

## 2011-11-09 ENCOUNTER — Telehealth: Payer: Self-pay | Admitting: Oncology

## 2011-11-09 NOTE — Telephone Encounter (Signed)
Talked to pt, gave him appt for 12/20/11 lab and MD

## 2011-11-15 ENCOUNTER — Encounter: Payer: Self-pay | Admitting: Family

## 2011-11-15 ENCOUNTER — Ambulatory Visit (INDEPENDENT_AMBULATORY_CARE_PROVIDER_SITE_OTHER): Payer: 59 | Admitting: Family

## 2011-11-15 VITALS — BP 120/80 | HR 92 | Temp 98.5°F | Wt 205.0 lb

## 2011-11-15 DIAGNOSIS — R42 Dizziness and giddiness: Secondary | ICD-10-CM

## 2011-11-15 DIAGNOSIS — R51 Headache: Secondary | ICD-10-CM

## 2011-11-15 MED ORDER — NAPROXEN 500 MG PO TABS: 500.0000 mg | ORAL_TABLET | Freq: Two times a day (BID) | ORAL | Status: DC

## 2011-11-15 NOTE — Progress Notes (Signed)
Subjective:    Patient ID: [name], male    DOB: 12/07/1945, 66 y.o.   MRN: [id number]  HPI 66 year old black male, former smoker, patient of Dr. Roseanne Reno is in today with complaints of vertigo, headache, pain behind his eyes and is here to go on for about a month. The headache pain is particularly worse in the morning occurring in the back and sides of his. Headache lasts about 15-20 minutes. He typically does not have to take medication for it. Last eye exam was November 20 12th. He's had cataracts removed and a macular tear repaired. In the past and been treated with an antibiotic that he thinks helped a little and had Flonase prescribed by an allergist but did not help at all.   Review of Systems  Constitutional: Negative.   HENT: Positive for ear pain. Negative for congestion, neck stiffness and tinnitus.   Eyes: Positive for pain.  Respiratory: Negative.   Cardiovascular: Negative.   Gastrointestinal: Negative.   Musculoskeletal: Negative.   Skin: Negative.   Neurological: Positive for dizziness and facial asymmetry.  Hematological: Negative.        Past Medical History  Diagnosis Date  . Hypertension   . Hyperlipidemia   . Campath-induced atrial fibrillation   . Mitral regurgitation   . Hypercholesteremia   . Insomnia   . Anxiety   . Aortic regurgitation   . History of prostate cancer   . Anticoagulants causing adverse effect in therapeutic use     History   Social History  . Marital Status: Single    Spouse Name: N/A    Number of Children: N/A  . Years of Education: N/A   Occupational History  . Not on file.   Social History Main Topics  . Smoking status: Former Smoker -- 1.0 packs/day    Types: Cigarettes    Quit date: 01/29/1981  . Smokeless tobacco: Never Used  . Alcohol Use: Yes  . Drug Use: No  . Sexually Active: Not on file   Other Topics Concern  . Not on file   Social History Narrative  . No narrative on file    Past Surgical  History  Procedure Date  . Cardiac catheterization   . Penile prosthesis implant   . Prostate surgery   . Cataract extraction     L 12/06/09   R 09/14/09  . Aortic valve replacement 2011    [facility]     Family History  Problem Relation Age of Onset  . Hyperlipidemia Other   . Hypertension Other   . Cancer      prostate  . Cancer Sister     uterine    Allergies  Allergen Reactions  . Aspirin     Can't take high dose  . Atorvastatin     REACTION: myalgia  . Buspirone Hcl     REACTION: headache  . Codeine Phosphate     REACTION: upset stomach, itching  . Hydrocodone-Acetaminophen   . Morphine Sulfate     REACTION: itching  . Oxycodone Hcl   . Rabeprazole     REACTION: headache  . Ramipril     REACTION: unspecified  . Telmisartan-Hctz     REACTION: sob,dizzy,palpitations    Current Outpatient Prescriptions on File Prior to Visit  Medication Sig Dispense Refill  . aspirin 81 MG tablet Take 81 mg by mouth daily.        . fluticasone (FLONASE) 50 MCG/ACT nasal spray USE 2 SPRAYS IN EACH NOSTRIL EVERY  DAY  16 g  5  . gabapentin (NEURONTIN) 300 MG capsule Half tab daily      . LORazepam (ATIVAN) 0.5 MG tablet Take 1 tablet (0.5 mg total) by mouth daily as needed.  30 tablet  3  . metoprolol succinate (TOPROL-XL) 25 MG 24 hr tablet TAKE 1 TABLET BY MOUTH EVERY DAY  30 tablet  11  . Omega-3 Fatty Acids (FISH OIL) 1000 MG CAPS Take by mouth daily.        Marland Kitchen PENNSAID 1.5 % SOLN as needed.       . Pitavastatin Calcium (LIVALO) 2 MG TABS 1/2 tab every other day  15 tablet  5  . traZODone (DESYREL) 50 MG tablet TAKE 1 TABLET AT BEDTIME  30 tablet  5  . venlafaxine (EFFEXOR-XR) 75 MG 24 hr capsule TAKE ONE CAPSULE BY MOUTH EVERY DAY  30 capsule  3    BP 120/80  Pulse 92  Temp(Src) 98.5 F (36.9 C) (Oral)  Wt 205 lb (92.987 kg)chart Objective:   Physical Exam  Constitutional: He is oriented to person, place, and time. He appears well-developed and well-nourished.  HENT:    Head: Normocephalic and atraumatic.  Left Ear: External ear normal.  Nose: Nose normal.  Mouth/Throat: Oropharynx is clear and moist.  Eyes: Conjunctivae and EOM are normal. Pupils are equal, round, and reactive to light.  Neck: Normal range of motion. Neck supple.  Cardiovascular: Normal rate, regular rhythm and normal heart sounds.   Pulmonary/Chest: Effort normal and breath sounds normal.  Musculoskeletal: Normal range of motion.  Neurological: He is alert and oriented to person, place, and time.  Skin: Skin is warm and dry.  Psychiatric: He has a normal mood and affect.          Assessment & Plan:  Assessment: Headache, vertigo  Plan: Discussed the risks and benefits of a CT scan of the head. Patient wishes to think about it and will call back. The meantime naproxen 500 mg twice a day when necessary with food if he needs anything for the headache. Advised not to take it if he doesn't need it. Patient to followup as scheduled and when necessary. Consider CT scan of the knee without contrast. Or referral to neurology.

## 2011-11-15 NOTE — Patient Instructions (Addendum)
Headache Headaches are caused by many different problems. Most commonly, headache is caused by muscle tension from an injury, fatigue, or emotional upset. Excessive muscle contractions in the scalp and neck result in a headache that often feels like a tight band around the head. Tension headaches often have areas of tenderness over the scalp and the back of the neck. These headaches may last for hours, days, or longer, and some may contribute to migraines in those who have migraine problems. Migraines usually cause a throbbing headache, which is made worse by activity. Sometimes only one side of the head hurts. Nausea, vomiting, eye pain, and avoidance of food are common with migraines. Visual symptoms such as light sensitivity, blind spots, or flashing lights may also occur. Loud noises may worsen migraine headaches. Many factors may cause migraine headaches:  Emotional stress, lack of sleep, and menstrual periods.   Alcohol and some drugs (such as birth control pills).   Diet factors (fasting, caffeine, food preservatives, chocolate).   Environmental factors (weather changes, bright lights, odors, smoke).  Other causes of headaches include minor injuries to the head. Arthritis in the neck; problems with the jaw, eyes, ears, or nose are also causes of headaches. Allergies, drugs, alcohol, and exposure to smoke can also cause moderate headaches. Rebound headaches can occur if someone uses pain medications for a long period of time and then stops. Less commonly, blood vessel problems in the neck and brain (including stroke) can cause various types of headache. Treatment of headaches includes medicines for pain and relaxation. Ice packs or heat applied to the back of the head and neck help some people. Massaging the shoulders, neck and scalp are often very useful. Relaxation techniques and stretching can help prevent these headaches. Avoid alcohol and cigarette smoking as these tend to make headaches  worse. Please see your caregiver if your headache is not better in 2 days.  SEEK IMMEDIATE MEDICAL CARE IF:   You develop a high fever, chills, or repeated vomiting.   You faint or have difficulty with vision.   You develop unusual numbness or weakness of your arms or legs.   Relief of pain is inadequate with medication, or you develop severe pain.   You develop confusion, or neck stiffness.   You have a worsening of a headache or do not obtain relief.  Document Released: 10/28/2005 Document Revised: 07/10/2011 Document Reviewed: 04/23/2007 [facility] Patient Information 2012 [place], [place].  Stress Stress-related medical problems are becoming increasingly common. The body has a built-in physical response to stressful situations. Faced with pressure, challenge or danger, we need to react quickly. Our bodies release hormones such as cortisol and adrenaline to help do this. These hormones are part of the "fight or flight" response and affect the metabolic rate, heart rate and blood pressure, resulting in a heightened, stressed state that prepares the body for optimum performance in dealing with a stressful situation. It is likely that early man required these mechanisms to stay alive, but usually modern stresses do not call for this, and the same hormones released in today's world can damage health and reduce coping ability. CAUSES  Pressure to perform at work, at school or in sports.   Threats of physical violence.   Money worries.   Arguments.   Family conflicts.   Divorce or separation from significant other.   Bereavement.   New job or unemployment.   Changes in location.   Alcohol or drug abuse.  SOMETIMES, THERE IS NO PARTICULAR REASON FOR DEVELOPING  STRESS. Almost all people are at risk of being stressed at some time in their lives. It is important to know that some stress is temporary and some is long term.  Temporary stress will go away when a situation is resolved.  Most people can cope with short periods of stress, and it can often be relieved by relaxing, taking a walk, chatting through issues with friends, or having a good night's sleep.   Chronic (long-term, continuous) stress is much harder to deal with. It can be psychologically and emotionally damaging. It can be harmful both for an individual and for friends and family.  SYMPTOMS Everyone reacts to stress differently. There are some common effects that help [name] recognize it. In times of extreme stress, people may:  Shake uncontrollably.   Breathe faster and deeper than normal (hyperventilate).   Vomit.   For people with asthma, stress can trigger an attack.   For some people, stress may trigger migraine headaches, ulcers, and body pain.  PHYSICAL EFFECTS OF STRESS MAY INCLUDE:  Loss of energy.   Skin problems.   Aches and pains resulting from tense muscles, including neck ache, backache and tension headaches.   Increased pain from arthritis and other conditions.   Irregular heart beat (palpitations).   Periods of irritability or anger.   Apathy or depression.   Anxiety (feeling uptight or worrying).   Unusual behavior.   Loss of appetite.   Comfort eating.   Lack of concentration.   Loss of, or decreased, sex-drive.   Increased smoking, drinking, or recreational drug use.   For women, missed periods.   Ulcers, joint pain, and muscle pain.  Post-traumatic stress is the stress caused by any serious accident, strong emotional damage, or extremely difficult or violent experience such as rape or war. Post-traumatic stress victims can experience mixtures of emotions such as fear, shame, depression, guilt or anger. It may include recurrent memories or images that may be haunting. These feelings can last for weeks, months or even years after the traumatic event that triggered them. Specialized treatment, possibly with medicines and psychological therapies, is available. If stress  is causing physical symptoms, severe distress or making it difficult for you to function as normal, it is worth seeing your caregiver. It is important to remember that although stress is a usual part of life, extreme or prolonged stress can lead to other illnesses that will need treatment. It is better to visit a doctor sooner rather than later. Stress has been linked to the development of high blood pressure and heart disease, as well as insomnia and depression. There is no diagnostic test for stress since everyone reacts to it differently. But a caregiver will be able to spot the physical symptoms, such as:  Headaches.   Shingles.   Ulcers.  Emotional distress such as intense worry, low mood or irritability should be detected when the doctor asks pertinent questions to identify any underlying problems that might be the cause. In case there are physical reasons for the symptoms, the doctor may also want to do some tests to exclude certain conditions. If you feel that you are suffering from stress, try to identify the aspects of your life that are causing it. Sometimes you may not be able to change or avoid them, but even a small change can have a positive ripple effect. A simple lifestyle change can make all the difference. STRATEGIES THAT CAN HELP DEAL WITH STRESS:  Delegating or sharing responsibilities.   Avoiding confrontations.  Learning to be more assertive.   Regular exercise.   Avoid using alcohol or street drugs to cope.   Eating a healthy, balanced diet, rich in fruit and vegetables and proteins.   Finding humor or absurdity in stressful situations.   Never taking on more than you know you can handle comfortably.   Organizing your time better to get as much done as possible.   Talking to friends or family and sharing your thoughts and fears.   Listening to music or relaxation tapes.   Tensing and then relaxing your muscles, starting at the toes and working up to the head  and neck.  If you think that you would benefit from help, either in identifying the things that are causing your stress or in learning techniques to help you relax, see a caregiver who is capable of helping you with this. Rather than relying on medications, it is usually better to try and identify the things in your life that are causing stress and try to deal with them. There are many techniques of managing stress including counseling, psychotherapy, aromatherapy, yoga, and exercise. Your caregiver can help you determine what is best for you. Document Released: 01/18/2003 Document Revised: 07/10/2011 Document Reviewed: 12/15/2007 [facility] Patient Information 2012 [place], [place].

## 2011-11-18 ENCOUNTER — Other Ambulatory Visit: Payer: Self-pay | Admitting: *Deleted

## 2011-11-18 MED ORDER — VENLAFAXINE HCL ER 75 MG PO CP24: 75.0000 mg | ORAL_CAPSULE | Freq: Every day | ORAL | Status: DC

## 2011-11-20 ENCOUNTER — Telehealth: Payer: Self-pay | Admitting: *Deleted

## 2011-11-20 NOTE — Telephone Encounter (Signed)
Pt. Wanted Padonda to know that he wants to wait on the CT scan as he is feeling some better with the headaches and new meds.

## 2011-11-20 NOTE — Telephone Encounter (Signed)
Great!

## 2011-11-21 ENCOUNTER — Ambulatory Visit (INDEPENDENT_AMBULATORY_CARE_PROVIDER_SITE_OTHER): Payer: 59 | Admitting: Psychology

## 2011-11-21 DIAGNOSIS — F4321 Adjustment disorder with depressed mood: Secondary | ICD-10-CM

## 2011-12-05 ENCOUNTER — Ambulatory Visit (INDEPENDENT_AMBULATORY_CARE_PROVIDER_SITE_OTHER): Payer: 59 | Admitting: Psychology

## 2011-12-05 DIAGNOSIS — F4321 Adjustment disorder with depressed mood: Secondary | ICD-10-CM

## 2011-12-20 ENCOUNTER — Ambulatory Visit (HOSPITAL_BASED_OUTPATIENT_CLINIC_OR_DEPARTMENT_OTHER): Payer: 59 | Admitting: Oncology

## 2011-12-20 ENCOUNTER — Other Ambulatory Visit: Payer: 59 | Admitting: Lab

## 2011-12-20 DIAGNOSIS — Z8546 Personal history of malignant neoplasm of prostate: Secondary | ICD-10-CM

## 2011-12-20 DIAGNOSIS — C9110 Chronic lymphocytic leukemia of B-cell type not having achieved remission: Secondary | ICD-10-CM

## 2011-12-20 LAB — CBC WITH DIFFERENTIAL/PLATELET
BASO%: 0.8 % (ref 0.0–2.0)
Basophils Absolute: 0.1 10*3/uL (ref 0.0–0.1)
EOS%: 1.4 % (ref 0.0–7.0)
Eosinophils Absolute: 0.1 10*3/uL (ref 0.0–0.5)
HCT: 43.3 % (ref 38.4–49.9)
HGB: 14.4 g/dL (ref 13.0–17.1)
LYMPH%: 62.8 % — ABNORMAL HIGH (ref 14.0–49.0)
MCH: 31.1 pg (ref 27.2–33.4)
MCHC: 33.3 g/dL (ref 32.0–36.0)
MCV: 93.3 fL (ref 79.3–98.0)
MONO#: 0.7 10*3/uL (ref 0.1–0.9)
MONO%: 8.9 % (ref 0.0–14.0)
NEUT#: 1.9 10*3/uL (ref 1.5–6.5)
NEUT%: 26.1 % — ABNORMAL LOW (ref 39.0–75.0)
Platelets: 208 10*3/uL (ref 140–400)
RBC: 4.64 10*6/uL (ref 4.20–5.82)
RDW: 13.4 % (ref 11.0–14.6)
WBC: 7.3 10*3/uL (ref 4.0–10.3)
lymph#: 4.6 10*3/uL — ABNORMAL HIGH (ref 0.9–3.3)

## 2011-12-20 NOTE — Progress Notes (Signed)
OFFICE PROGRESS NOTE   INTERVAL HISTORY:   He returns as scheduled. He feels well. He denies fever, night sweats, and anorexia. He reports no recent infection aside from "sinus congestion ". He is up-to-date on the pneumococcal and influenza vaccines.  Objective:  Vital signs in last 24 hours:  Blood pressure 127/75, pulse 75, temperature 98.9 F (37.2 C), temperature source Oral, height 5\' 10"  (1.778 m), weight 201 lb (91.173 kg).    HEENT: Oropharynx without visible mass, neck without mass Lymphatics: Pea-sized bilateral scalene nodes. No other cervical, supraclavicular, axillary, inguinal, or femoral nodes Resp: Lungs clear bilaterally Cardio: Regular rate and rhythm GI: No hepatosplenomegaly Vascular: No leg edema   Lab Results:  Lab Results  Component Value Date   WBC 7.3 12/20/2011   HGB 14.4 12/20/2011   HCT 43.3 12/20/2011   MCV 93.3 12/20/2011   PLT 208 12/20/2011   ANC 1.9, absolute lymphocyte count 4.6 (absolute lymphocyte  count 5.0 on 05/06/2011    Medications: I have reviewed the patient's current medications.  Assessment/Plan: 1. Mild lymphocytosis.  Flow cytometry analysis of the peripheral lymphocytes and review of the peripheral blood smear are consistent with a diagnosis of early stage chronic lymphocytic leukemia. The lymphocyte count is stable 2. Remote history of prostate cancer. 3. Status post aortic valve replacement. 4. Report of a deep venous thrombosis/pulmonary embolism following aortic valve replacement surgery. 5. Anxiety disorder. 6. Hyperlipidemia. 7. Hypertension.    Disposition:  He is stable from a hematologic standpoint. He appears to have early-stage CLL. There is no indication for treatment at present. The immunoglobulin levels were normal when he was here in 2012. He remains at increased risk for infections. He will seek medical attention for signs/symptoms of infection. He will stay up-to-date on the influenza and pneumococcal  vaccines.  [name] would like to continue clinical followup with Dr.Swords. We did not schedule a followup appointment in the hematology clinic. We will be glad to see him in the future as needed. Indications for treating the CLL include development of anemia, systemic "B. "symptoms, and markedly enlarged lymph nodes/splenomegaly   Lucile Shutters, MD  12/20/2011  11:01 AM

## 2012-01-02 ENCOUNTER — Ambulatory Visit: Payer: 59 | Admitting: Psychology

## 2012-01-16 ENCOUNTER — Ambulatory Visit (INDEPENDENT_AMBULATORY_CARE_PROVIDER_SITE_OTHER): Payer: 59 | Admitting: Psychology

## 2012-01-16 DIAGNOSIS — F4321 Adjustment disorder with depressed mood: Secondary | ICD-10-CM

## 2012-01-20 ENCOUNTER — Telehealth: Payer: Self-pay | Admitting: Cardiovascular Disease

## 2012-01-20 MED ORDER — RIVAROXABAN 20 MG PO TABS: 20.0000 mg | ORAL_TABLET | ORAL | Status: DC | PRN

## 2012-01-20 NOTE — Telephone Encounter (Signed)
Please return call  To patient [phone]  Patient needs Xarelto samples before he leaves to go out town tomorrow.  If at all possible prescription can be sent to [facility] on [address]. Please return call ASAP

## 2012-01-20 NOTE — Telephone Encounter (Signed)
Script sent  per pt's request ./cy

## 2012-01-30 ENCOUNTER — Ambulatory Visit: Payer: 59 | Admitting: Psychology

## 2012-02-10 ENCOUNTER — Other Ambulatory Visit: Payer: Self-pay | Admitting: *Deleted

## 2012-02-10 MED ORDER — LORAZEPAM 0.5 MG PO TABS: 0.5000 mg | ORAL_TABLET | Freq: Every day | ORAL | Status: DC | PRN

## 2012-02-13 ENCOUNTER — Ambulatory Visit: Payer: 59 | Admitting: Psychology

## 2012-02-14 ENCOUNTER — Ambulatory Visit (INDEPENDENT_AMBULATORY_CARE_PROVIDER_SITE_OTHER): Payer: 59 | Admitting: Psychology

## 2012-02-14 DIAGNOSIS — F4321 Adjustment disorder with depressed mood: Secondary | ICD-10-CM

## 2012-02-20 ENCOUNTER — Encounter: Payer: Self-pay | Admitting: Cardiovascular Disease

## 2012-02-20 ENCOUNTER — Ambulatory Visit (INDEPENDENT_AMBULATORY_CARE_PROVIDER_SITE_OTHER): Payer: 59 | Admitting: Cardiovascular Disease

## 2012-02-20 VITALS — BP 141/92 | HR 68 | Ht 70.0 in | Wt 204.0 lb

## 2012-02-20 DIAGNOSIS — E785 Hyperlipidemia, unspecified: Secondary | ICD-10-CM

## 2012-02-20 DIAGNOSIS — I359 Nonrheumatic aortic valve disorder, unspecified: Secondary | ICD-10-CM

## 2012-02-20 DIAGNOSIS — I2699 Other pulmonary embolism without acute cor pulmonale: Secondary | ICD-10-CM

## 2012-02-20 DIAGNOSIS — I10 Essential (primary) hypertension: Secondary | ICD-10-CM

## 2012-02-20 DIAGNOSIS — I4891 Unspecified atrial fibrillation: Secondary | ICD-10-CM

## 2012-02-20 NOTE — Progress Notes (Signed)
Patient ID: [name], male   DOB: 02/16/1946, 66 y.o.   MRN: [id number] [name] is seen for F/U of anticoagulation post AVR, history of PAF. DVT with large bilateral PE 9/11. F/U CT and LE [name] with resolving thrombus burden 09/18/10. Fairly long period of subRx anticoagulation on 2 separate courses of Lovenox. INR now Rx. Still some dyspnea and fatigue. No SSCP, palpitations, edema. No bleeding problems. Duplex done 01/18/11 showed Some partial nonoccluding clot in SFA and proximal popliteal. He really wants to come off coumadin. He will finish a year of anticoagulation at the end of this month and has good resolution of thrombus burden by imaging  Recent Dx of low grade CLL.  Sees [employer].  Taking prophylactic Xarelto for flights.    Will retire in May.  Just bought new S4 and redid his backyard  ROS: Denies fever, malais, weight loss, blurry vision, decreased visual acuity, cough, sputum, SOB, hemoptysis, pleuritic pain, palpitaitons, heartburn, abdominal pain, melena, lower extremity edema, claudication, or rash.  All other systems reviewed and negative  General: Affect appropriate Healthy:  appears stated age HEENT: normal Neck supple with no adenopathy JVP normal no bruits no thyromegaly Lungs clear with no wheezing and good diaphragmatic motion Heart:  S1/S2 SEM from AVR, no rub, gallop or click PMI normal Abdomen: benighn, BS positve, no tenderness, no AAA no bruit.  No HSM or HJR Distal pulses intact with no bruits No edema Neuro non-focal Skin warm and dry No muscular weakness   Current Outpatient Prescriptions  Medication Sig Dispense Refill  . aspirin 81 MG tablet Take 81 mg by mouth daily.        . fluticasone (FLONASE) 50 MCG/ACT nasal spray USE 2 SPRAYS IN EACH NOSTRIL EVERY DAY  16 g  5  . LORazepam (ATIVAN) 0.5 MG tablet Take 1 tablet (0.5 mg total) by mouth daily as needed.  30 tablet  1  . metoprolol succinate (TOPROL-XL) 25 MG 24 hr tablet TAKE 1 TABLET BY MOUTH EVERY  DAY  30 tablet  11  . naproxen (NAPROSYN) 500 MG tablet Take 1 tablet (500 mg total) by mouth 2 (two) times daily with a meal.  60 tablet  2  . Naproxen Sodium (ALEVE) 220 MG CAPS Take by mouth as needed.      . Omega-3 Fatty Acids (FISH OIL) 1000 MG CAPS Take by mouth daily.        Marland Kitchen PENNSAID 1.5 % SOLN as needed.       . Rivaroxaban (XARELTO) 20 MG TABS Take 1 tablet by mouth as needed.      . traZODone (DESYREL) 50 MG tablet TAKE 1 TABLET AT BEDTIME  30 tablet  5  . DISCONTD: Rivaroxaban (XARELTO) 20 MG TABS Take 20 mg by mouth as needed.  30 tablet  1    Allergies  Aspirin; Atorvastatin; Buspirone hcl; Codeine phosphate; Hydrocodone-acetaminophen; Morphine sulfate; Oxycodone hcl; Rabeprazole; Ramipril; and Telmisartan-hctz  Electrocardiogram:  NSR rate 68 LAD RBBB LVH  Assessment and Plan

## 2012-02-20 NOTE — Assessment & Plan Note (Signed)
Well controlled.  Continue current medications and low sodium Dash type diet.    

## 2012-02-20 NOTE — Assessment & Plan Note (Signed)
Resolved no cor pulmonale on echo coumadin/xarelto finished.  Prophylactic xarelto for long flights

## 2012-02-20 NOTE — Assessment & Plan Note (Signed)
Cholesterol is at goal.  Continue current dose of statin and diet Rx.  No myalgias or side effects.  F/U  LFT's in 6 months. Lab Results  Component Value Date   LDLCALC  Value: 161        Total Cholesterol/HDL:CHD Risk Coronary Heart Disease Risk Table                     Men   Women  1/2 Average Risk   3.4   3.3  Average Risk       5.0   4.4  2 X Average Risk   9.6   7.1  3 X Average Risk  23.4   11.0        Use the calculated Patient Ratio above and the CHD Risk Table to determine the patient's CHD Risk.        ATP III CLASSIFICATION (LDL):  <100     mg/dL   Optimal  100-129  mg/dL   Near or Above                    Optimal  130-159  mg/dL   Borderline  160-189  mg/dL   High  >190     mg/dL   Very High* 07/17/2010             

## 2012-02-20 NOTE — Assessment & Plan Note (Signed)
Resolved  bifasicular block related to AVR

## 2012-02-20 NOTE — Patient Instructions (Signed)
Your physician wants you to follow-up in:  6 months. You will receive a reminder letter in the mail two months in advance. If you don't receive a letter, please call our office to schedule the follow-up appointment.   

## 2012-02-20 NOTE — Assessment & Plan Note (Signed)
S/P AVR with normal exam  SBE prophylaxis

## 2012-02-22 ENCOUNTER — Other Ambulatory Visit: Payer: Self-pay | Admitting: Internal Medicine

## 2012-02-27 ENCOUNTER — Ambulatory Visit (INDEPENDENT_AMBULATORY_CARE_PROVIDER_SITE_OTHER): Payer: 59 | Admitting: Psychology

## 2012-02-27 DIAGNOSIS — F4321 Adjustment disorder with depressed mood: Secondary | ICD-10-CM

## 2012-03-10 ENCOUNTER — Ambulatory Visit (INDEPENDENT_AMBULATORY_CARE_PROVIDER_SITE_OTHER): Payer: 59 | Admitting: Psychology

## 2012-03-10 DIAGNOSIS — F4321 Adjustment disorder with depressed mood: Secondary | ICD-10-CM

## 2012-03-12 ENCOUNTER — Other Ambulatory Visit: Payer: Self-pay | Admitting: *Deleted

## 2012-03-12 MED ORDER — LORAZEPAM 0.5 MG PO TABS: 0.5000 mg | ORAL_TABLET | Freq: Every day | ORAL | Status: DC | PRN

## 2012-03-19 ENCOUNTER — Ambulatory Visit (INDEPENDENT_AMBULATORY_CARE_PROVIDER_SITE_OTHER): Payer: 59 | Admitting: Internal Medicine

## 2012-03-19 ENCOUNTER — Encounter: Payer: Self-pay | Admitting: Internal Medicine

## 2012-03-19 VITALS — BP 124/72 | HR 84 | Temp 98.2°F | Wt 203.0 lb

## 2012-03-19 DIAGNOSIS — E785 Hyperlipidemia, unspecified: Secondary | ICD-10-CM

## 2012-03-19 DIAGNOSIS — I2699 Other pulmonary embolism without acute cor pulmonale: Secondary | ICD-10-CM

## 2012-03-19 DIAGNOSIS — Z0000 Encounter for general adult medical examination without abnormal findings: Secondary | ICD-10-CM

## 2012-03-19 DIAGNOSIS — I10 Essential (primary) hypertension: Secondary | ICD-10-CM

## 2012-03-19 DIAGNOSIS — I4891 Unspecified atrial fibrillation: Secondary | ICD-10-CM

## 2012-03-19 DIAGNOSIS — I359 Nonrheumatic aortic valve disorder, unspecified: Secondary | ICD-10-CM

## 2012-03-19 LAB — LIPID PANEL
Cholesterol: 225 mg/dL — ABNORMAL HIGH (ref 0–200)
HDL: 42.5 mg/dL (ref >39.00)
Total CHOL/HDL Ratio: 5
Triglycerides: 179 mg/dL — ABNORMAL HIGH (ref 0.0–149.0)
VLDL: 35.8 mg/dL (ref 0.0–40.0)

## 2012-03-19 LAB — BASIC METABOLIC PANEL
BUN: 19 mg/dL (ref 6–23)
CO2: 27 mEq/L (ref 19–32)
Calcium: 9.3 mg/dL (ref 8.4–10.5)
Chloride: 107 mEq/L (ref 96–112)
Creatinine, Ser: 1.2 mg/dL (ref 0.4–1.5)
GFR: 76.41 mL/min (ref >60.00)
Glucose, Bld: 62 mg/dL — ABNORMAL LOW (ref 70–99)
Potassium: 4.7 mEq/L (ref 3.5–5.1)
Sodium: 144 mEq/L (ref 135–145)

## 2012-03-19 LAB — HEPATIC FUNCTION PANEL
ALT: 20 U/L (ref 0–53)
AST: 18 U/L (ref 0–37)
Albumin: 4.1 g/dL (ref 3.5–5.2)
Alkaline Phosphatase: 58 U/L (ref 39–117)
Bilirubin, Direct: 0 mg/dL (ref 0.0–0.3)
Total Bilirubin: 0.6 mg/dL (ref 0.3–1.2)
Total Protein: 6.7 g/dL (ref 6.0–8.3)

## 2012-03-19 LAB — LDL CHOLESTEROL, DIRECT: Direct LDL: 157.5 mg/dL

## 2012-03-19 NOTE — Progress Notes (Signed)
Sinus congestion some better with omnaris.  S/p aortic valve surgery. --- doing well.   History of pulmonary embolus on warfarin  Past Medical History  Diagnosis Date  . Hypertension   . Hyperlipidemia   . Atrial fibrillation   . Mitral regurgitation   . Hypercholesteremia   . Insomnia   . Anxiety   . Aortic regurgitation   . History of prostate cancer   . Anticoagulants causing adverse effect in therapeutic use     History   Social History  . Marital Status: Single    Spouse Name: N/A    Number of Children: N/A  . Years of Education: N/A   Occupational History  . Not on file.   Social History Main Topics  . Smoking status: Former Smoker -- 1.0 packs/day    Types: Cigarettes    Quit date: 01/29/1981  . Smokeless tobacco: Never Used  . Alcohol Use: Yes  . Drug Use: No  . Sexually Active: Not on file   Other Topics Concern  . Not on file   Social History Narrative  . No narrative on file    Past Surgical History  Procedure Date  . Cardiac catheterization   . Penile prosthesis implant   . Prostate surgery   . Cataract extraction     L 12/06/09   R 09/14/09  . Aortic valve replacement 2011    [facility]     Family History  Problem Relation Age of Onset  . Hyperlipidemia Other   . Hypertension Other   . Cancer      prostate  . Cancer Sister     uterine  . Colon cancer Sister     Allergies  Allergen Reactions  . Aspirin     Can't take high dose  . Atorvastatin     REACTION: myalgia  . Buspirone Hcl     REACTION: headache  . Codeine Phosphate     REACTION: upset stomach, itching  . Hydrocodone-Acetaminophen   . Morphine Sulfate     REACTION: itching  . Oxycodone Hcl   . Rabeprazole     REACTION: headache  . Ramipril     REACTION: unspecified  . Telmisartan-Hctz     REACTION: sob,dizzy,palpitations    Current Outpatient Prescriptions on File Prior to Visit  Medication Sig Dispense Refill  . aspirin 81 MG tablet Take 81 mg by mouth daily.         . ciclesonide (OMNARIS) 50 MCG/ACT nasal spray Place 2 sprays into both nostrils daily.      Marland Kitchen LORazepam (ATIVAN) 0.5 MG tablet Take 1 tablet (0.5 mg total) by mouth daily as needed.  30 tablet  1  . metoprolol succinate (TOPROL-XL) 25 MG 24 hr tablet TAKE 1 TABLET BY MOUTH EVERY DAY  30 tablet  11  . naproxen (NAPROSYN) 500 MG tablet Take 1 tablet (500 mg total) by mouth 2 (two) times daily with a meal.  60 tablet  2  . Omega-3 Fatty Acids (FISH OIL) 1000 MG CAPS Take by mouth daily.        Marland Kitchen PENNSAID 1.5 % SOLN as needed.       . Rivaroxaban (XARELTO) 20 MG TABS Take 1 tablet by mouth as needed.      . traZODone (DESYREL) 50 MG tablet TAKE 1 TABLET AT BEDTIME  30 tablet  5     patient denies chest pain, shortness of breath, orthopnea. Denies lower extremity edema, abdominal pain, change in appetite, change in bowel  movements. Patient denies rashes, musculoskeletal complaints. No other specific complaints in a complete review of systems.   BP 124/72  Pulse 84  Temp(Src) 98.2 F (36.8 C) (Oral)  Wt 203 lb (92.08 kg)  well-developed well-nourished male in no acute distress. HEENT exam atraumatic, normocephalic, neck supple without jugular venous distention. Chest clear to auscultation cardiac exam S1-S2 are regular. Abdominal exam overweight with bowel sounds, soft and nontender. Extremities no edema. Neurologic exam is alert with a normal gait.

## 2012-03-22 NOTE — Assessment & Plan Note (Signed)
No recurrent symptoms.

## 2012-03-22 NOTE — Assessment & Plan Note (Signed)
Continue warfarin therapy.

## 2012-03-25 ENCOUNTER — Ambulatory Visit (INDEPENDENT_AMBULATORY_CARE_PROVIDER_SITE_OTHER): Payer: 59 | Admitting: Psychology

## 2012-03-25 DIAGNOSIS — F4321 Adjustment disorder with depressed mood: Secondary | ICD-10-CM

## 2012-03-26 ENCOUNTER — Ambulatory Visit: Payer: 59 | Admitting: Psychology

## 2012-04-01 ENCOUNTER — Other Ambulatory Visit: Payer: Self-pay | Admitting: *Deleted

## 2012-04-01 MED ORDER — NAPROXEN 500 MG PO TABS: 500.0000 mg | ORAL_TABLET | Freq: Two times a day (BID) | ORAL | Status: DC

## 2012-04-09 ENCOUNTER — Ambulatory Visit (INDEPENDENT_AMBULATORY_CARE_PROVIDER_SITE_OTHER): Payer: 59 | Admitting: Psychology

## 2012-04-09 DIAGNOSIS — F4321 Adjustment disorder with depressed mood: Secondary | ICD-10-CM

## 2012-04-23 ENCOUNTER — Ambulatory Visit (INDEPENDENT_AMBULATORY_CARE_PROVIDER_SITE_OTHER): Payer: 59 | Admitting: Psychology

## 2012-04-23 DIAGNOSIS — F4321 Adjustment disorder with depressed mood: Secondary | ICD-10-CM

## 2012-04-28 ENCOUNTER — Other Ambulatory Visit: Payer: Self-pay | Admitting: Internal Medicine

## 2012-04-30 ENCOUNTER — Telehealth: Payer: Self-pay | Admitting: Internal Medicine

## 2012-04-30 ENCOUNTER — Ambulatory Visit (INDEPENDENT_AMBULATORY_CARE_PROVIDER_SITE_OTHER): Payer: 59 | Admitting: Family Medicine

## 2012-04-30 ENCOUNTER — Encounter: Payer: Self-pay | Admitting: Family Medicine

## 2012-04-30 VITALS — BP 140/88 | Temp 98.0°F | Wt 204.0 lb

## 2012-04-30 DIAGNOSIS — J309 Allergic rhinitis, unspecified: Secondary | ICD-10-CM

## 2012-04-30 MED ORDER — TRIAMCINOLONE ACETONIDE(NASAL) 55 MCG/ACT NA INHA: 2.0000 | Freq: Every day | NASAL | Status: DC

## 2012-04-30 NOTE — Patient Instructions (Addendum)
Allergic Rhinitis  Allergic rhinitis is when the mucous membranes in the nose respond to allergens. Allergens are particles in the air that cause your body to have an allergic reaction. This causes you to release allergic antibodies. Through a chain of events, these eventually cause you to release histamine into the blood stream (hence the use of antihistamines). Although meant to be protective to the body, it is this release that causes your discomfort, such as frequent sneezing, congestion and an itchy runny nose.    CAUSES    The pollen allergens may come from grasses, trees, and weeds. This is seasonal allergic rhinitis, or "hay fever." Other allergens cause year-round allergic rhinitis (perennial allergic rhinitis) such as house dust mite allergen, pet dander and mold spores.    SYMPTOMS     Nasal stuffiness (congestion).   Runny, itchy nose with sneezing and tearing of the eyes.   There is often an itching of the mouth, eyes and ears.  It cannot be cured, but it can be controlled with medications.  DIAGNOSIS    If you are unable to determine the offending allergen, skin or blood testing may find it.  TREATMENT     Avoid the allergen.   Medications and allergy shots (immunotherapy) can help.   Hay fever may often be treated with antihistamines in pill or nasal spray forms. Antihistamines block the effects of histamine. There are over-the-counter medicines that may help with nasal congestion and swelling around the eyes. Check with your caregiver before taking or giving this medicine.  If the treatment above does not work, there are many new medications your caregiver can prescribe. Stronger medications may be used if initial measures are ineffective. Desensitizing injections can be used if medications and avoidance fails. Desensitization is when a patient is given ongoing shots until the body becomes less sensitive to the allergen. Make sure you follow up with your caregiver if problems continue.  SEEK  MEDICAL CARE IF:     You develop fever (more than 100.5 F (38.1 C).   You develop a cough that does not stop easily (persistent).   You have shortness of breath.   You start wheezing.   Symptoms interfere with normal daily activities.  Document Released: 07/23/2001 Document Revised: 10/17/2011 Document Reviewed: 02/01/2009  ExitCare Patient Information 2012 [employer].

## 2012-04-30 NOTE — Telephone Encounter (Signed)
Caller: [name]; PCP: Birdie Sons; CB#: [phone]; ; ; Call regarding Cough/Congestion; Afebrile.  Onset- "months per pt" for the sinus pressure and congestion but now he is having congestion in the ears also  and it is making him off balance with walking. Emergent s/s of URI and Ear s/s protocols r/o. Pt to see provider wihtin 24hrs. Appt scheduled for 11am today with Dr. Caryl Never. Pt states he was given Omnaris nasal spray at [facility] in past that helped s/s but he is out of that rx and it cost him too much for the copay for that rx.

## 2012-04-30 NOTE — Progress Notes (Signed)
  Subjective:    Patient ID: [name], male    DOB: 06-Jun-1946, 66 y.o.   MRN: [id number]  HPI  Acute visit. Patient seen with persistent sinus congestion now for several weeks. Went to urgent care in May was placed him on Omnaris which seemed to help. Unfortunately, his insurance will not cover this. He had persistent nasal congestion. No postnasal drip. No purulent secretions. No fever. No headaches. Rare dry cough. Has previously used Flonase without much improvement. Has avoided antihistamines.   Review of Systems  Constitutional: Negative for fever and chills.  HENT: Positive for congestion. Negative for sore throat, rhinorrhea, voice change and sinus pressure.   Respiratory: Negative for cough and wheezing.   Neurological: Negative for dizziness.  Hematological: Negative for adenopathy.       Objective:   Physical Exam  Constitutional: He appears well-developed and well-nourished.  HENT:  Right Ear: External ear normal.  Left Ear: External ear normal.  Mouth/Throat: Oropharynx is clear and moist.       Somewhat swollen nasal mucosa otherwise normal exam  Neck: Neck supple.  Cardiovascular: Normal rate and regular rhythm.   Pulmonary/Chest: Effort normal and breath sounds normal. No respiratory distress. He has no wheezes. He has no rales.  Lymphadenopathy:    He has no cervical adenopathy.          Assessment & Plan:  Allergic rhinitis. Nasacort AQ 2 sprays per nostril once daily. Consider over-the-counter plain antihistamines such as Allegra if symptoms persist

## 2012-05-06 ENCOUNTER — Telehealth: Payer: Self-pay | Admitting: Internal Medicine

## 2012-05-06 NOTE — Telephone Encounter (Signed)
Dr Cato Mulligan out of the office, routed to Dr Caryl Never

## 2012-05-06 NOTE — Telephone Encounter (Signed)
Caller: [name]/Patient; Phone Number: [phone]; Message from caller: Pt calling today 05/06/12 regarding saw Dr.  Caryl Never on 04/30/12 for sinus issues.  MD prescribed a nasal spray at that time.  Pt had used Omnaris nasal spary before however he wanted something different this time b/c of the cost of the Omnaris.  Said the current nasal spray is not working.  Has changed his mind and would like the Omnaris called in to his pharmacy, said he is willing to pay the extra cost.  Pharmacy is [facility] on [address].  PLEASE CALL PT BACK AT [phone] TO LET HIM KNOW THIS HAS BEEN DONE.

## 2012-05-06 NOTE — Telephone Encounter (Signed)
omnaris 2 sprays per nostril once daily.

## 2012-05-07 ENCOUNTER — Other Ambulatory Visit: Payer: Self-pay | Admitting: *Deleted

## 2012-05-07 ENCOUNTER — Ambulatory Visit (INDEPENDENT_AMBULATORY_CARE_PROVIDER_SITE_OTHER): Payer: 59 | Admitting: Psychology

## 2012-05-07 DIAGNOSIS — F4321 Adjustment disorder with depressed mood: Secondary | ICD-10-CM

## 2012-05-07 MED ORDER — LORAZEPAM 0.5 MG PO TABS: 0.5000 mg | ORAL_TABLET | Freq: Every day | ORAL | Status: DC | PRN

## 2012-05-07 MED ORDER — CICLESONIDE 50 MCG/ACT NA SUSP: 2.0000 | Freq: Every day | NASAL | Status: DC

## 2012-05-07 NOTE — Telephone Encounter (Signed)
rx sent in electronically, pt aware 

## 2012-05-21 ENCOUNTER — Ambulatory Visit: Payer: 59 | Admitting: Psychology

## 2012-06-04 ENCOUNTER — Ambulatory Visit (INDEPENDENT_AMBULATORY_CARE_PROVIDER_SITE_OTHER): Payer: 59 | Admitting: Psychology

## 2012-06-04 DIAGNOSIS — F4321 Adjustment disorder with depressed mood: Secondary | ICD-10-CM

## 2012-06-18 ENCOUNTER — Ambulatory Visit (INDEPENDENT_AMBULATORY_CARE_PROVIDER_SITE_OTHER): Payer: 59 | Admitting: Psychology

## 2012-06-18 DIAGNOSIS — F4321 Adjustment disorder with depressed mood: Secondary | ICD-10-CM

## 2012-07-02 ENCOUNTER — Ambulatory Visit: Payer: 59 | Admitting: Psychology

## 2012-07-30 ENCOUNTER — Ambulatory Visit: Payer: 59 | Admitting: Psychology

## 2012-08-06 ENCOUNTER — Other Ambulatory Visit: Payer: Self-pay | Admitting: *Deleted

## 2012-08-06 MED ORDER — METOPROLOL SUCCINATE ER 25 MG PO TB24: 25.0000 mg | ORAL_TABLET | Freq: Every day | ORAL | Status: DC

## 2012-08-06 NOTE — Telephone Encounter (Signed)
Refilled metoprolol 

## 2012-08-07 ENCOUNTER — Other Ambulatory Visit: Payer: Self-pay | Admitting: Internal Medicine

## 2012-08-11 ENCOUNTER — Encounter: Payer: Self-pay | Admitting: Family Medicine

## 2012-08-11 ENCOUNTER — Ambulatory Visit (INDEPENDENT_AMBULATORY_CARE_PROVIDER_SITE_OTHER): Payer: 59 | Admitting: Family Medicine

## 2012-08-11 VITALS — BP 140/98 | HR 89 | Temp 98.4°F | Wt 195.0 lb

## 2012-08-11 DIAGNOSIS — H6990 Unspecified Eustachian tube disorder, unspecified ear: Secondary | ICD-10-CM

## 2012-08-11 DIAGNOSIS — H6980 Other specified disorders of Eustachian tube, unspecified ear: Secondary | ICD-10-CM

## 2012-08-11 NOTE — Progress Notes (Signed)
  Subjective:    Patient ID: [name], male    DOB: 01-11-1946, 66 y.o.   MRN: [id number]  HPI Here for one week of pressure in the right ear with mild discomfort. He has chronic nasal and sinus congestion. Using Omnaris sprays sporadically. No ST or HA or cough.    Review of Systems  Constitutional: Negative.   HENT: Positive for hearing loss, ear pain, congestion, sinus pressure and tinnitus. Negative for sore throat.   Eyes: Negative.   Respiratory: Negative.        Objective:   Physical Exam  Constitutional: He appears well-developed and well-nourished.  HENT:  Head: Normocephalic and atraumatic.  Right Ear: External ear normal.  Left Ear: External ear normal.  Nose: Nose normal.  Mouth/Throat: Oropharynx is clear and moist.  Eyes: Conjunctivae normal are normal. Pupils are equal, round, and reactive to light.  Pulmonary/Chest: Effort normal and breath sounds normal.  Lymphadenopathy:    He has no cervical adenopathy.          Assessment & Plan:  Use the Omnaris every day and add Mucinex 1200 mg bid.

## 2012-08-13 ENCOUNTER — Ambulatory Visit (INDEPENDENT_AMBULATORY_CARE_PROVIDER_SITE_OTHER): Payer: 59 | Admitting: Psychology

## 2012-08-13 DIAGNOSIS — F4321 Adjustment disorder with depressed mood: Secondary | ICD-10-CM

## 2012-08-14 ENCOUNTER — Ambulatory Visit (INDEPENDENT_AMBULATORY_CARE_PROVIDER_SITE_OTHER): Payer: 59 | Admitting: Internal Medicine

## 2012-08-14 VITALS — BP 126/80 | HR 74 | Temp 97.9°F | Wt 193.0 lb

## 2012-08-14 DIAGNOSIS — Z23 Encounter for immunization: Secondary | ICD-10-CM

## 2012-08-14 DIAGNOSIS — J329 Chronic sinusitis, unspecified: Secondary | ICD-10-CM

## 2012-08-14 MED ORDER — DOXYCYCLINE HYCLATE 100 MG PO TABS: 100.0000 mg | ORAL_TABLET | Freq: Two times a day (BID) | ORAL | Status: AC

## 2012-08-14 NOTE — Progress Notes (Signed)
Patient ID: [name], male   DOB: [birth year], 66 y.o.   MRN: [id number]  Sinus congestion- right ear pressure Reviewed dr. Claris Che note He also has some left sided teet pressure/pain No fever.  Reviewed pmh, Shx   well-developed well-nourished male in no acute distress. HEENT exam atraumatic, normocephalic, neck supple without jugular venous distention. Chest clear to auscultation cardiac exam S1-S2 are regular. Abdominal exam overweight with bowel sounds, soft and nontender. Extremities no edema.   Neurologic exam is alert with a normal gait.  A/p sinusitis- doxycycline Call if sxs persist

## 2012-08-23 ENCOUNTER — Other Ambulatory Visit: Payer: Self-pay | Admitting: Internal Medicine

## 2012-08-24 ENCOUNTER — Other Ambulatory Visit: Payer: Self-pay | Admitting: Internal Medicine

## 2012-08-27 ENCOUNTER — Telehealth: Payer: Self-pay | Admitting: Internal Medicine

## 2012-08-27 ENCOUNTER — Ambulatory Visit: Payer: 59 | Admitting: Psychology

## 2012-08-27 NOTE — Telephone Encounter (Signed)
Dr Cato Mulligan not back in the office till 10/23.  Called pt and l/m on his cell phone that he needs to call back and schedule with another physician

## 2012-08-27 NOTE — Telephone Encounter (Signed)
Caller: Delio/Patient; Patient Name: [name]; PCP: Birdie Sons (Adults only); Best Callback Phone Number: [phone] [name] noticed a bump that he calls a "polyp" the size of a pea on his rectum today.  Denies pain.  Denies drainage.  Afebrile.  Utilized Rectal Symptoms Guideline.  Home care disposition.  Angus has an appt on 09/18/12 with Dr. Cato Mulligan.  He is going out of the country on 10[place]21 and wants to know if Dr. Cato Mulligan can see him today or tomorrow d/t his concerns about the bump.  No appts available in EPIC with Dr. Cato Mulligan and pt only wants to see PCP.  OFFICE, PLEASE F/U WITH [name] FOR APPT.

## 2012-09-10 ENCOUNTER — Ambulatory Visit: Payer: Self-pay | Admitting: Psychology

## 2012-09-18 ENCOUNTER — Ambulatory Visit: Payer: 59 | Admitting: Internal Medicine

## 2012-09-24 ENCOUNTER — Encounter: Payer: Self-pay | Admitting: Family Medicine

## 2012-09-24 ENCOUNTER — Ambulatory Visit (INDEPENDENT_AMBULATORY_CARE_PROVIDER_SITE_OTHER): Payer: 59 | Admitting: Family Medicine

## 2012-09-24 ENCOUNTER — Ambulatory Visit (INDEPENDENT_AMBULATORY_CARE_PROVIDER_SITE_OTHER): Payer: 59 | Admitting: Psychology

## 2012-09-24 ENCOUNTER — Telehealth: Payer: Self-pay | Admitting: Internal Medicine

## 2012-09-24 VITALS — BP 120/88 | HR 96 | Temp 98.7°F | Wt 197.0 lb

## 2012-09-24 DIAGNOSIS — F4321 Adjustment disorder with depressed mood: Secondary | ICD-10-CM

## 2012-09-24 DIAGNOSIS — J329 Chronic sinusitis, unspecified: Secondary | ICD-10-CM

## 2012-09-24 MED ORDER — AMOXICILLIN-POT CLAVULANATE 875-125 MG PO TABS: 1.0000 | ORAL_TABLET | Freq: Two times a day (BID) | ORAL | Status: DC

## 2012-09-24 NOTE — Progress Notes (Signed)
Chief Complaint  Patient presents with  . Otalgia  . Sinus Problem    x9 days    HPI:  -started: 9 days ago -symptoms:nasal congestion, sore throat, cough, R maxillary tooth pain - saw dentist, pressure in R ear -denies:fever, SOB, NVD, strep or mono exposure -has tried: prophy abx for one day prior to dentist visit helped, tylenol -sick contacts: none -Hx of: sinusitis and deviated septum   ROS: See pertinent positives and negatives per HPI.  Past Medical History  Diagnosis Date  . Hypertension   . Hyperlipidemia   . Atrial fibrillation   . Mitral regurgitation   . Hypercholesteremia   . Insomnia   . Anxiety   . Aortic regurgitation   . History of prostate cancer   . Anticoagulants causing adverse effect in therapeutic use     Family History  Problem Relation Age of Onset  . Hyperlipidemia Other   . Hypertension Other   . Cancer      prostate  . Cancer Sister     uterine  . Colon cancer Sister     History   Social History  . Marital Status: Single    Spouse Name: N/A    Number of Children: N/A  . Years of Education: N/A   Social History Main Topics  . Smoking status: Former Smoker -- 1.0 packs/day    Types: Cigarettes    Quit date: 01/29/1981  . Smokeless tobacco: Never Used  . Alcohol Use: Yes  . Drug Use: No  . Sexually Active: None   Other Topics Concern  . None   Social History Narrative  . None    Current outpatient prescriptions:aspirin 81 MG tablet, Take 81 mg by mouth daily.  , Disp: , Rfl: ;  ciclesonide (OMNARIS) 50 MCG/ACT nasal spray, Place 2 sprays into both nostrils daily., Disp: 12.5 g, Rfl: 0;  LORazepam (ATIVAN) 0.5 MG tablet, TAKE 1/2 TO 1 TABLET BY MOUTH ONCE DAILY AS NEEDED, Disp: 30 tablet, Rfl: 2;  metoprolol succinate (TOPROL-XL) 25 MG 24 hr tablet, Take 1 tablet (25 mg total) by mouth daily., Disp: 30 tablet, Rfl: 11 niacin 250 MG tablet, Take 250 mg by mouth daily with breakfast., Disp: , Rfl: ;  Omega-3 Fatty Acids (FISH  OIL) 1000 MG CAPS, Take by mouth daily.  , Disp: , Rfl: ;  Rivaroxaban (XARELTO) 20 MG TABS, Take 1 tablet by mouth as needed., Disp: , Rfl: ;  traZODone (DESYREL) 50 MG tablet, TAKE 1 TABLET AT BEDTIME, Disp: 30 tablet, Rfl: 5;  venlafaxine (EFFEXOR) 75 MG tablet, Take 75 mg by mouth daily., Disp: , Rfl:  amoxicillin-clavulanate (AUGMENTIN) 875-125 MG per tablet, Take 1 tablet by mouth 2 (two) times daily., Disp: 20 tablet, Rfl: 0;  [DISCONTINUED] traZODone (DESYREL) 50 MG tablet, TAKE 1 TABLET AT BEDTIME, Disp: 30 tablet, Rfl: 5  EXAM:  Filed Vitals:   09/24/12 0913  BP: 120/88  Pulse: 96  Temp: 98.7 F (37.1 C)    There is no height on file to calculate BMI.  GENERAL: vitals reviewed and listed above, alert, oriented, appears well hydrated and in no acute distress  HEENT: atraumatic, conjunttiva clear, no obvious abnormalities on inspection of external nose and ears, normal appearance of ear canals and TMs, deviated septum to R, clear nasal congestion, mild post oropharyngeal erythema with PND, no tonsillar edema or exudate, R max sinus TTP  NECK: no obvious masses on inspection  LUNGS: clear to auscultation bilaterally, no wheezes, rales or rhonchi,  good air movement  CV: HRRR, no peripheral edema  MS: moves all extremities without noticeable abnormality  PSYCH: pleasant and cooperative, no obvious depression or anxiety  ASSESSMENT AND PLAN:  Discussed the following assessment and plan:  1. Sinusitis  amoxicillin-clavulanate (AUGMENTIN) 875-125 MG per tablet   -discussed risks/benefits abx -Pt will follow up with PCP - if recurrent issue advised should consider seeing ENT -Patient advised to return or notify a doctor immediately if symptoms worsen or persist or new concerns arise.  There are no Patient Instructions on file for this visit.   Kriste Basque R.

## 2012-09-24 NOTE — Patient Instructions (Addendum)
INSTRUCTIONS FOR UPPER RESPIRATORY INFECTION:  -As we discussed, we have prescribed a new medication for you at this appointment. We discussed the common and serious potential adverse effects of this medication and you can review these and more with the pharmacist when you pick up your medication.  Please follow the instructions for use carefully and notify us immediately if you have any problems taking this medication.  -plenty of rest and fluids  -nasal saline wash 2-3 times daily (use prepackaged nasal saline or bottled/distilled water if making your own)   -can use tylenol or ibuprofen as directed for aches and sorethroat  -in the winter time, using a humidifier at night is helpful (please follow cleaning instructions)  -if you are taking a cough medication - use only as directed, may also try a teaspoon of honey to coat the throat and throat lozenges  -for sore throat, salt water gargles can help  -follow up if you have fevers, are worsening or not getting better in 3-4 days

## 2012-09-24 NOTE — Telephone Encounter (Signed)
Caller: [name]/Patient; Patient Name: [name]; PCP: Birdie Sons (Adults only); Best Callback Phone Number: [phone]. Caller reports he always has some issues when he flies and has same today. Caller reports he was seen and given antibiotics last time and asking for same today. Caller advised he must be seen to be considered for meds. Caller is agreeable and would like an appointment for sinus pain and pressure and congestion. Appointment scheduled for today, Thurs 11/14 at 9am with Dr Dahlia Client. Caller is agreeable.

## 2012-09-28 ENCOUNTER — Ambulatory Visit (INDEPENDENT_AMBULATORY_CARE_PROVIDER_SITE_OTHER): Payer: 59 | Admitting: Internal Medicine

## 2012-09-28 ENCOUNTER — Encounter: Payer: Self-pay | Admitting: Internal Medicine

## 2012-09-28 VITALS — BP 124/82 | HR 76 | Temp 99.3°F | Wt 196.0 lb

## 2012-09-28 DIAGNOSIS — I10 Essential (primary) hypertension: Secondary | ICD-10-CM

## 2012-09-28 DIAGNOSIS — E785 Hyperlipidemia, unspecified: Secondary | ICD-10-CM

## 2012-09-28 NOTE — Progress Notes (Signed)
Patient ID: ARNOLDO SALZMAN, male   DOB: 1946/10/03, 66 y.o.   MRN: [id number]  sinus sxs-- resolving. Still with some left sided ear pain  Mod- doing well  htn-- tolerating meds  Hx pe-- xarelto  Wonders about clonoscopy-- when due  Reviewed pmh, psh, soc hx   patient denies chest pain, shortness of breath, orthopnea. Denies lower extremity edema, abdominal pain, change in appetite, change in bowel movements. Patient denies rashes, musculoskeletal complaints. No other specific complaints in a complete review of systems.    well-developed well-nourished male in no acute distress. HEENT exam atraumatic, normocephalic, neck supple without jugular venous distention. Chest clear to auscultation cardiac exam S1-S2 are regular. Abdominal exam overweight with bowel sounds, soft and nontender. Extremities no edema. Neurologic exam is alert with a normal gait.

## 2012-09-30 NOTE — Assessment & Plan Note (Signed)
Not able to tolerate statin

## 2012-09-30 NOTE — Assessment & Plan Note (Signed)
BP: 124/82 mmHg  Fair control Continue same meds

## 2012-10-06 ENCOUNTER — Ambulatory Visit (INDEPENDENT_AMBULATORY_CARE_PROVIDER_SITE_OTHER): Payer: 59 | Admitting: Psychology

## 2012-10-06 DIAGNOSIS — F4321 Adjustment disorder with depressed mood: Secondary | ICD-10-CM

## 2012-10-09 ENCOUNTER — Other Ambulatory Visit: Payer: Self-pay | Admitting: Internal Medicine

## 2012-10-12 ENCOUNTER — Other Ambulatory Visit: Payer: Self-pay | Admitting: Internal Medicine

## 2012-10-22 ENCOUNTER — Ambulatory Visit (INDEPENDENT_AMBULATORY_CARE_PROVIDER_SITE_OTHER): Payer: 59 | Admitting: Psychology

## 2012-10-22 DIAGNOSIS — F4321 Adjustment disorder with depressed mood: Secondary | ICD-10-CM

## 2012-11-12 ENCOUNTER — Ambulatory Visit: Payer: 59 | Admitting: Psychology

## 2012-11-18 ENCOUNTER — Ambulatory Visit (INDEPENDENT_AMBULATORY_CARE_PROVIDER_SITE_OTHER): Payer: 59 | Admitting: Psychology

## 2012-11-18 ENCOUNTER — Other Ambulatory Visit: Payer: Self-pay | Admitting: Internal Medicine

## 2012-11-18 DIAGNOSIS — F4321 Adjustment disorder with depressed mood: Secondary | ICD-10-CM

## 2012-11-19 ENCOUNTER — Ambulatory Visit: Payer: 59 | Admitting: Psychology

## 2012-11-25 ENCOUNTER — Telehealth: Payer: Self-pay | Admitting: Internal Medicine

## 2012-11-25 NOTE — Telephone Encounter (Signed)
Patient calls regarding sleep issues.  He has always had nightmares, but in the last month they have intensified with  Patient calling them "ugly".   He is wondering if it could be related to the Trazadone and Metoprolol he takes.  Denies any change in medications with dosing, new addition of meds or deletion.  He wonders if he needs to continue take the Metoprolol.  He wants to stay on the Trazodone because it does help him go to sleep.  OFFICE:  PLEASE FOLLOW UP WITH PATIENT REGARDING NIGHTMARES AND THE CONCERNS HE HAS WITH TRAZODONE AND METOPROLOL. THANKS

## 2012-11-26 MED ORDER — IMIPRAMINE HCL 10 MG PO TABS: 10.0000 mg | ORAL_TABLET | Freq: Every evening | ORAL | Status: DC | PRN

## 2012-11-26 NOTE — Telephone Encounter (Signed)
Stop trazodone. Try imipramine 10 mg by mouth each bedtime when necessary insomnia. #30, 3 refills.

## 2012-11-26 NOTE — Telephone Encounter (Signed)
rx sent in electronically, pt aware to d/c trazodone

## 2012-12-03 ENCOUNTER — Ambulatory Visit (INDEPENDENT_AMBULATORY_CARE_PROVIDER_SITE_OTHER): Payer: 59 | Admitting: Psychology

## 2012-12-03 DIAGNOSIS — F4321 Adjustment disorder with depressed mood: Secondary | ICD-10-CM

## 2012-12-09 ENCOUNTER — Encounter: Payer: Self-pay | Admitting: Gastroenterology

## 2012-12-14 ENCOUNTER — Encounter: Payer: Self-pay | Admitting: Gastroenterology

## 2012-12-17 ENCOUNTER — Ambulatory Visit (INDEPENDENT_AMBULATORY_CARE_PROVIDER_SITE_OTHER): Payer: 59 | Admitting: Psychology

## 2012-12-17 DIAGNOSIS — F4321 Adjustment disorder with depressed mood: Secondary | ICD-10-CM

## 2012-12-21 ENCOUNTER — Other Ambulatory Visit: Payer: Self-pay | Admitting: *Deleted

## 2012-12-21 MED ORDER — VENLAFAXINE HCL 75 MG PO TABS: 75.0000 mg | ORAL_TABLET | Freq: Every day | ORAL | Status: DC

## 2012-12-31 ENCOUNTER — Ambulatory Visit: Payer: 59 | Admitting: Psychology

## 2013-01-04 ENCOUNTER — Telehealth: Payer: Self-pay | Admitting: Internal Medicine

## 2013-01-04 MED ORDER — VENLAFAXINE HCL ER 75 MG PO CP24: 75.0000 mg | ORAL_CAPSULE | Freq: Every day | ORAL | Status: DC

## 2013-01-04 NOTE — Telephone Encounter (Signed)
Pt called about his medication being changed from caps to pills and something about the cost. RN called back /left vm x 1.

## 2013-01-12 ENCOUNTER — Encounter: Payer: Self-pay | Admitting: Gastroenterology

## 2013-01-12 ENCOUNTER — Ambulatory Visit (AMBULATORY_SURGERY_CENTER): Payer: 59 | Admitting: *Deleted

## 2013-01-12 VITALS — Ht 70.0 in | Wt 202.0 lb

## 2013-01-12 DIAGNOSIS — Z1211 Encounter for screening for malignant neoplasm of colon: Secondary | ICD-10-CM

## 2013-01-12 MED ORDER — NA SULFATE-K SULFATE-MG SULF 17.5-3.13-1.6 GM/177ML PO SOLN: ORAL | Status: DC

## 2013-01-14 ENCOUNTER — Ambulatory Visit (INDEPENDENT_AMBULATORY_CARE_PROVIDER_SITE_OTHER): Payer: 59 | Admitting: Psychology

## 2013-01-14 DIAGNOSIS — F4321 Adjustment disorder with depressed mood: Secondary | ICD-10-CM

## 2013-01-26 ENCOUNTER — Encounter: Payer: Self-pay | Admitting: Gastroenterology

## 2013-01-26 ENCOUNTER — Ambulatory Visit (AMBULATORY_SURGERY_CENTER): Payer: 59 | Admitting: Gastroenterology

## 2013-01-26 VITALS — BP 131/89 | HR 62 | Temp 97.1°F | Resp 35 | Ht 70.0 in | Wt 202.0 lb

## 2013-01-26 DIAGNOSIS — Z1211 Encounter for screening for malignant neoplasm of colon: Secondary | ICD-10-CM

## 2013-01-26 HISTORY — PX: COLONOSCOPY: SHX174

## 2013-01-26 MED ORDER — SODIUM CHLORIDE 0.9 % IV SOLN: 500.0000 mL | INTRAVENOUS | Status: DC

## 2013-01-26 NOTE — Progress Notes (Signed)
Patient did not experience any of the following events: a burn prior to discharge; a fall within the facility; wrong site/side/patient/procedure/implant event; or a hospital transfer or hospital admission upon discharge from the facility. (G8907) Patient did not have preoperative order for IV antibiotic SSI prophylaxis. (G8918)  

## 2013-01-26 NOTE — Op Note (Signed)
[facility] [address] [place], [address]   COLONOSCOPY PROCEDURE REPORT  PATIENT: [name], [name]  MR#: [id number] BIRTHDATE: Jun 11, 1946 , 66  yrs. old GENDER: Male ENDOSCOPIST: Louis Meckel, MD REFERRED XB:JYNWG Swords, M.D. PROCEDURE DATE:  01/26/2013 PROCEDURE:   Colonoscopy, screening ASA CLASS:   Class II INDICATIONS:average risk screening. MEDICATIONS: MAC sedation, administered by CRNA and propofol (Diprivan) 300mg  IV  DESCRIPTION OF PROCEDURE:   After the risks benefits and alternatives of the procedure were thoroughly explained, informed consent was obtained.  A digital rectal exam revealed no abnormalities of the rectum.   The LB [id number]  endoscope was introduced through the anus and advanced to the cecum, which was identified by both the appendix and ileocecal valve. No adverse events experienced.   The quality of the prep was Suprep excellent The instrument was then slowly withdrawn as the colon was fully examined.      COLON FINDINGS: Mild diverticulosis was noted in the sigmoid colon. The colon mucosa was otherwise normal.  Retroflexed views revealed no abnormalities. The time to cecum=3 minutes 23 seconds. Withdrawal time=6 minutes 48 seconds.  The scope was withdrawn and the procedure completed. COMPLICATIONS: There were no complications.  ENDOSCOPIC IMPRESSION: 1.   Mild diverticulosis was noted in the sigmoid colon 2.   The colon mucosa was otherwise normal  RECOMMENDATIONS: Continue current colorectal screening recommendations for "routine risk" patients with a repeat colonoscopy in 10 years.   eSigned:  Louis Meckel, MD 01/26/2013 11:14 AM   cc:

## 2013-01-26 NOTE — Patient Instructions (Addendum)
YOU HAD AN ENDOSCOPIC PROCEDURE TODAY AT THE [facility]: Refer to the procedure report that was given to you for any specific questions about what was found during the examination.  If the procedure report does not answer your questions, please call your gastroenterologist to clarify.  If you requested that your care partner not be given the details of your procedure findings, then the procedure report has been included in a sealed envelope for you to review at your convenience later.  YOU SHOULD EXPECT: Some feelings of bloating in the abdomen. Passage of more gas than usual.  Walking can help get rid of the air that was put into your GI tract during the procedure and reduce the bloating. If you had a lower endoscopy (such as a colonoscopy or flexible sigmoidoscopy) you may notice spotting of blood in your stool or on the toilet paper. If you underwent a bowel prep for your procedure, then you may not have a normal bowel movement for a few days.  DIET: Your first meal following the procedure should be a light meal and then it is ok to progress to your normal diet.  A half-sandwich or bowl of soup is an example of a good first meal.  Heavy or fried foods are harder to digest and may make you feel nauseous or bloated.  Likewise meals heavy in dairy and vegetables can cause extra gas to form and this can also increase the bloating.  Drink plenty of fluids but you should avoid alcoholic beverages for 24 hours.  ACTIVITY: Your care partner should take you home directly after the procedure.  You should plan to take it easy, moving slowly for the rest of the day.  You can resume normal activity the day after the procedure however you should NOT DRIVE or use heavy machinery for 24 hours (because of the sedation medicines used during the test).    SYMPTOMS TO REPORT IMMEDIATELY: A gastroenterologist can be reached at any hour.  During normal business hours, 8:30 AM to 5:00 PM Monday through Friday,  call [phone].  After hours and on weekends, please call the GI answering service at [phone] who will take a message and have the physician on call contact you.   Following lower endoscopy (colonoscopy or flexible sigmoidoscopy):  Excessive amounts of blood in the stool  Significant tenderness or worsening of abdominal pains  Swelling of the abdomen that is new, acute  Fever of 100F or higher    FOLLOW UP: If any biopsies were taken you will be contacted by phone or by letter within the next 1-3 weeks.  Call your gastroenterologist if you have not heard about the biopsies in 3 weeks.  Our staff will call the home number listed on your records the next business day following your procedure to check on you and address any questions or concerns that you may have at that time regarding the information given to you following your procedure. This is a courtesy call and so if there is no answer at the home number and we have not heard from you through the emergency physician on call, we will assume that you have returned to your regular daily activities without incident.  SIGNATURES/CONFIDENTIALITY: You and/or your care partner have signed paperwork which will be entered into your electronic medical record.  These signatures attest to the fact that that the information above on your After Visit Summary has been reviewed and is understood.  Full responsibility of the confidentiality   of this discharge information lies with you and/or your care-partner.    Information on diverticulosis and high fiber diet given to you today.  hippa patient therefore only diet and safety precautions reviewed with carepartner.diagnosis reviewed with pt

## 2013-01-27 ENCOUNTER — Telehealth: Payer: Self-pay | Admitting: *Deleted

## 2013-01-27 NOTE — Telephone Encounter (Signed)
  Follow up Call-  Call back number 01/26/2013  Post procedure Call Back phone  # [phone]  Permission to leave phone message Yes     Patient questions:  Do you have a fever, pain , or abdominal swelling? no Pain Score  0 *  Have you tolerated food without any problems? yes  Have you been able to return to your normal activities? yes  Do you have any questions about your discharge instructions: Diet   no Medications  no Follow up visit  no  Do you have questions or concerns about your Care? no  Actions: * If pain score is 4 or above: No action needed, pain <4.

## 2013-01-28 ENCOUNTER — Ambulatory Visit (INDEPENDENT_AMBULATORY_CARE_PROVIDER_SITE_OTHER): Payer: 59 | Admitting: Psychology

## 2013-01-28 DIAGNOSIS — F4321 Adjustment disorder with depressed mood: Secondary | ICD-10-CM

## 2013-02-24 ENCOUNTER — Other Ambulatory Visit: Payer: Self-pay | Admitting: Internal Medicine

## 2013-02-25 ENCOUNTER — Ambulatory Visit (INDEPENDENT_AMBULATORY_CARE_PROVIDER_SITE_OTHER): Payer: 59 | Admitting: Psychology

## 2013-02-25 DIAGNOSIS — F4321 Adjustment disorder with depressed mood: Secondary | ICD-10-CM

## 2013-03-11 ENCOUNTER — Ambulatory Visit (INDEPENDENT_AMBULATORY_CARE_PROVIDER_SITE_OTHER): Payer: 59 | Admitting: Psychology

## 2013-03-11 DIAGNOSIS — F4321 Adjustment disorder with depressed mood: Secondary | ICD-10-CM

## 2013-03-25 ENCOUNTER — Ambulatory Visit (INDEPENDENT_AMBULATORY_CARE_PROVIDER_SITE_OTHER): Payer: 59 | Admitting: Psychology

## 2013-03-25 DIAGNOSIS — F4321 Adjustment disorder with depressed mood: Secondary | ICD-10-CM

## 2013-03-26 ENCOUNTER — Other Ambulatory Visit: Payer: Self-pay | Admitting: Internal Medicine

## 2013-03-26 ENCOUNTER — Other Ambulatory Visit: Payer: Self-pay | Admitting: *Deleted

## 2013-03-26 MED ORDER — NAPROXEN 500 MG PO TABS: ORAL_TABLET | ORAL | Status: DC

## 2013-03-30 ENCOUNTER — Ambulatory Visit (INDEPENDENT_AMBULATORY_CARE_PROVIDER_SITE_OTHER): Payer: 59 | Admitting: Internal Medicine

## 2013-03-30 ENCOUNTER — Encounter: Payer: Self-pay | Admitting: Internal Medicine

## 2013-03-30 VITALS — BP 138/94 | HR 76 | Temp 98.3°F | Wt 203.0 lb

## 2013-03-30 DIAGNOSIS — I4891 Unspecified atrial fibrillation: Secondary | ICD-10-CM

## 2013-03-30 DIAGNOSIS — I359 Nonrheumatic aortic valve disorder, unspecified: Secondary | ICD-10-CM

## 2013-03-30 DIAGNOSIS — E785 Hyperlipidemia, unspecified: Secondary | ICD-10-CM

## 2013-03-30 DIAGNOSIS — Z8546 Personal history of malignant neoplasm of prostate: Secondary | ICD-10-CM

## 2013-03-30 DIAGNOSIS — I2699 Other pulmonary embolism without acute cor pulmonale: Secondary | ICD-10-CM

## 2013-03-30 DIAGNOSIS — I10 Essential (primary) hypertension: Secondary | ICD-10-CM

## 2013-03-30 DIAGNOSIS — K219 Gastro-esophageal reflux disease without esophagitis: Secondary | ICD-10-CM

## 2013-03-30 LAB — CBC WITH DIFFERENTIAL/PLATELET
Basophils Absolute: 0 10*3/uL (ref 0.0–0.1)
Basophils Relative: 0.4 % (ref 0.0–3.0)
Eosinophils Absolute: 0.1 10*3/uL (ref 0.0–0.7)
Eosinophils Relative: 1.2 % (ref 0.0–5.0)
HCT: 45.3 % (ref 39.0–52.0)
Hemoglobin: 15.2 g/dL (ref 13.0–17.0)
Lymphocytes Relative: 70.2 % — ABNORMAL HIGH (ref 12.0–46.0)
Lymphs Abs: 7.6 10*3/uL — ABNORMAL HIGH (ref 0.7–4.0)
MCHC: 33.5 g/dL (ref 30.0–36.0)
MCV: 93.4 fl (ref 78.0–100.0)
Monocytes Absolute: 0.8 10*3/uL (ref 0.1–1.0)
Monocytes Relative: 7 % (ref 3.0–12.0)
Neutro Abs: 2.3 10*3/uL (ref 1.4–7.7)
Neutrophils Relative %: 21.2 % — ABNORMAL LOW (ref 43.0–77.0)
Platelets: 192 10*3/uL (ref 150.0–400.0)
RBC: 4.85 Mil/uL (ref 4.22–5.81)
RDW: 13.9 % (ref 11.5–14.6)
WBC: 10.9 10*3/uL — ABNORMAL HIGH (ref 4.5–10.5)

## 2013-03-30 LAB — LIPID PANEL
Cholesterol: 262 mg/dL — ABNORMAL HIGH (ref 0–200)
HDL: 56.2 mg/dL (ref >39.00)
Total CHOL/HDL Ratio: 5
Triglycerides: 149 mg/dL (ref 0.0–149.0)
VLDL: 29.8 mg/dL (ref 0.0–40.0)

## 2013-03-30 LAB — BASIC METABOLIC PANEL
BUN: 16 mg/dL (ref 6–23)
CO2: 27 mEq/L (ref 19–32)
Calcium: 9.4 mg/dL (ref 8.4–10.5)
Chloride: 102 mEq/L (ref 96–112)
Creatinine, Ser: 1.1 mg/dL (ref 0.4–1.5)
GFR: 84.95 mL/min (ref >60.00)
Glucose, Bld: 89 mg/dL (ref 70–99)
Potassium: 4.4 mEq/L (ref 3.5–5.1)
Sodium: 138 mEq/L (ref 135–145)

## 2013-03-30 LAB — HEPATIC FUNCTION PANEL
ALT: 40 U/L (ref 0–53)
AST: 29 U/L (ref 0–37)
Albumin: 4.1 g/dL (ref 3.5–5.2)
Alkaline Phosphatase: 67 U/L (ref 39–117)
Bilirubin, Direct: 0.1 mg/dL (ref 0.0–0.3)
Total Bilirubin: 0.8 mg/dL (ref 0.3–1.2)
Total Protein: 6.4 g/dL (ref 6.0–8.3)

## 2013-03-30 LAB — LDL CHOLESTEROL, DIRECT: Direct LDL: 169 mg/dL

## 2013-03-30 LAB — TSH: TSH: 1.32 u[IU]/mL (ref 0.35–5.50)

## 2013-03-30 LAB — PSA: PSA: 0 ng/mL — ABNORMAL LOW (ref 0.10–4.00)

## 2013-03-30 MED ORDER — RIVAROXABAN 20 MG PO TABS: 20.0000 mg | ORAL_TABLET | ORAL | Status: DC | PRN

## 2013-03-30 NOTE — Progress Notes (Signed)
Generally well  Mood- no more nightmares. (he still dreams but not violent)  AVR- no current sxs  Sleep-- has intermittent trouble with sleep.   AFIB- on xarelto  Reviewed pmh, psh, sochx   patient denies chest pain, shortness of breath, orthopnea. Denies lower extremity edema, abdominal pain, change in appetite, change in bowel movements. Patient denies rashes, musculoskeletal complaints. No other specific complaints in a complete review of systems.    well-developed well-nourished male in no acute distress. HEENT exam atraumatic, normocephalic, neck supple without jugular venous distention. Chest clear to auscultation cardiac exam S1-S2 are regular. Abdominal exam overweight with bowel sounds, soft and nontender. Extremities no edema. Neurologic exam is alert with a normal gait.

## 2013-04-01 NOTE — Assessment & Plan Note (Signed)
F/u with urology yearly

## 2013-04-01 NOTE — Assessment & Plan Note (Signed)
On rivaroxoban

## 2013-04-01 NOTE — Assessment & Plan Note (Signed)
S/p surgery Doing well No sxs

## 2013-04-08 ENCOUNTER — Ambulatory Visit (INDEPENDENT_AMBULATORY_CARE_PROVIDER_SITE_OTHER): Payer: 59 | Admitting: Psychology

## 2013-04-08 DIAGNOSIS — F4321 Adjustment disorder with depressed mood: Secondary | ICD-10-CM

## 2013-04-22 ENCOUNTER — Ambulatory Visit (INDEPENDENT_AMBULATORY_CARE_PROVIDER_SITE_OTHER): Payer: 59 | Admitting: Psychology

## 2013-04-22 DIAGNOSIS — F4321 Adjustment disorder with depressed mood: Secondary | ICD-10-CM

## 2013-05-06 ENCOUNTER — Ambulatory Visit: Payer: 59 | Admitting: Psychology

## 2013-05-17 ENCOUNTER — Ambulatory Visit (INDEPENDENT_AMBULATORY_CARE_PROVIDER_SITE_OTHER): Payer: 59 | Admitting: Psychology

## 2013-05-17 DIAGNOSIS — F4321 Adjustment disorder with depressed mood: Secondary | ICD-10-CM

## 2013-05-20 ENCOUNTER — Ambulatory Visit: Payer: 59 | Admitting: Psychology

## 2013-05-22 ENCOUNTER — Other Ambulatory Visit: Payer: Self-pay | Admitting: Internal Medicine

## 2013-05-24 ENCOUNTER — Other Ambulatory Visit: Payer: Self-pay | Admitting: *Deleted

## 2013-05-24 MED ORDER — NAPROXEN 500 MG PO TABS: ORAL_TABLET | ORAL | Status: DC

## 2013-05-24 MED ORDER — IMIPRAMINE HCL 10 MG PO TABS: ORAL_TABLET | ORAL | Status: DC

## 2013-05-24 MED ORDER — VENLAFAXINE HCL ER 75 MG PO CP24: 75.0000 mg | ORAL_CAPSULE | Freq: Every day | ORAL | Status: DC

## 2013-05-25 ENCOUNTER — Other Ambulatory Visit: Payer: Self-pay | Admitting: Internal Medicine

## 2013-05-25 ENCOUNTER — Other Ambulatory Visit: Payer: Self-pay | Admitting: *Deleted

## 2013-05-25 MED ORDER — METOPROLOL SUCCINATE ER 25 MG PO TB24: 25.0000 mg | ORAL_TABLET | Freq: Every day | ORAL | Status: DC

## 2013-06-03 ENCOUNTER — Telehealth: Payer: Self-pay | Admitting: Internal Medicine

## 2013-06-03 ENCOUNTER — Ambulatory Visit (INDEPENDENT_AMBULATORY_CARE_PROVIDER_SITE_OTHER): Payer: 59 | Admitting: Psychology

## 2013-06-03 DIAGNOSIS — F4321 Adjustment disorder with depressed mood: Secondary | ICD-10-CM

## 2013-06-03 NOTE — Telephone Encounter (Signed)
Patient Information:  Caller Name: Rogen  Phone: [phone]  Patient: [name], [name]  Gender: Male  DOB: November 29, 1945  Age: 67 Years  PCP: Birdie Sons (Adults only)  Office Follow Up:  Does the office need to follow up with this patient?: No  Instructions For The Office: N/A  RN Note:  Used Round Up 3 weeks ago for weed control without gloves. History of foot drop and numbness on dorsum of left foot from old leg injury. Foot drop resolved after PT. Going out of town 06/04/13.  Declined to schedule appointment now; prefers to call orthopod for appointment. Will call back for appointment if unable to get appointment with orthopod.  Symptoms  Reason For Call & Symptoms: Difficulty walking or pivoing on heel; its difficult to lift up foot or toes.   Can walk on toes.  History of foot drop related to nerve damage after left leg injury approximately 45 years ago.  Reviewed Health History In EMR: Yes  Reviewed Medications In EMR: Yes  Reviewed Allergies In EMR: Yes  Reviewed Surgeries / Procedures: Yes  Date of Onset of Symptoms: 05/26/2013  Guideline(s) Used:  Leg Pain  Neurologic Deficit  Disposition Per Guideline:   See Within 3 Days in Office  Reason For Disposition Reached:   Numbness or tingling in one or both feet is a chronic symptom (recurrent or ongoing problem lasting > 4 weeks)  Advice Given:  N/A  RN Overrode Recommendation:  Follow Up With Office Later  Declined to schedule now; plans to call orthopod.

## 2013-06-17 ENCOUNTER — Ambulatory Visit (INDEPENDENT_AMBULATORY_CARE_PROVIDER_SITE_OTHER): Payer: 59 | Admitting: Psychology

## 2013-06-17 DIAGNOSIS — F4321 Adjustment disorder with depressed mood: Secondary | ICD-10-CM

## 2013-07-01 ENCOUNTER — Ambulatory Visit: Payer: 59 | Admitting: Psychology

## 2013-07-15 ENCOUNTER — Ambulatory Visit (INDEPENDENT_AMBULATORY_CARE_PROVIDER_SITE_OTHER): Payer: 59 | Admitting: Psychology

## 2013-07-15 DIAGNOSIS — F4321 Adjustment disorder with depressed mood: Secondary | ICD-10-CM

## 2013-07-29 ENCOUNTER — Ambulatory Visit (INDEPENDENT_AMBULATORY_CARE_PROVIDER_SITE_OTHER): Payer: 59 | Admitting: Psychology

## 2013-07-29 DIAGNOSIS — F4321 Adjustment disorder with depressed mood: Secondary | ICD-10-CM

## 2013-08-12 ENCOUNTER — Ambulatory Visit (INDEPENDENT_AMBULATORY_CARE_PROVIDER_SITE_OTHER): Payer: 59 | Admitting: Psychology

## 2013-08-12 DIAGNOSIS — F4321 Adjustment disorder with depressed mood: Secondary | ICD-10-CM

## 2013-08-23 ENCOUNTER — Ambulatory Visit (INDEPENDENT_AMBULATORY_CARE_PROVIDER_SITE_OTHER): Payer: Medicare Other | Admitting: Internal Medicine

## 2013-08-23 ENCOUNTER — Encounter: Payer: Self-pay | Admitting: Internal Medicine

## 2013-08-23 VITALS — BP 120/88 | HR 88 | Temp 98.2°F | Ht 70.0 in | Wt 200.0 lb

## 2013-08-23 DIAGNOSIS — I4891 Unspecified atrial fibrillation: Secondary | ICD-10-CM

## 2013-08-23 DIAGNOSIS — Z23 Encounter for immunization: Secondary | ICD-10-CM

## 2013-08-23 DIAGNOSIS — I82409 Acute embolism and thrombosis of unspecified deep veins of unspecified lower extremity: Secondary | ICD-10-CM

## 2013-08-23 DIAGNOSIS — E785 Hyperlipidemia, unspecified: Secondary | ICD-10-CM

## 2013-08-23 DIAGNOSIS — I2699 Other pulmonary embolism without acute cor pulmonale: Secondary | ICD-10-CM

## 2013-08-23 DIAGNOSIS — F411 Generalized anxiety disorder: Secondary | ICD-10-CM

## 2013-08-23 DIAGNOSIS — I10 Essential (primary) hypertension: Secondary | ICD-10-CM

## 2013-08-23 MED ORDER — VENLAFAXINE HCL ER 37.5 MG PO CP24: 37.5000 mg | ORAL_CAPSULE | Freq: Every day | ORAL | Status: DC

## 2013-08-23 NOTE — Assessment & Plan Note (Signed)
Unable to tolerate statins- Updated health maintenance

## 2013-08-23 NOTE — Progress Notes (Signed)
Pulmonary embolus- on rivaroxaban  Aortic regurg- s/p AoValve replacement (2011)  AFIB- no palpitations- on rivaroxaban without bleeding complications  Anxiety- doing well on meds  htn- no home bps- he has mistakenly not been taking metoprolol  Anxiety- meds are keeping sxs under control but occasionally will have night mares (on effexor and imipramine). He thinks not taking metoprolol has made the nightmares worse.   Reviewed pmh, shx, surghx, meds  ROS- no CP, SOB, png, orthopnea, no other specific concerns  Reviewed vitals  well-developed well-nourished male in no acute distress. HEENT exam atraumatic, normocephalic, neck supple without jugular venous distention. Chest clear to auscultation cardiac exam S1-S2 are regular. Abdominal exam overweight with bowel sounds, soft and nontender. Extremities no edema. Neurologic exam is alert with a normal gait.

## 2013-08-23 NOTE — Assessment & Plan Note (Signed)
Adequate control- continue same meds 

## 2013-08-23 NOTE — Assessment & Plan Note (Signed)
No recurrence- On rivaroxaban for AFIB

## 2013-08-23 NOTE — Assessment & Plan Note (Signed)
No recurrence. 

## 2013-08-23 NOTE — Assessment & Plan Note (Signed)
Clinically in SR- continue same meds and anticoagulation

## 2013-08-23 NOTE — Assessment & Plan Note (Signed)
Side effect of effexor= nightmares  Decrease effexor

## 2013-08-26 ENCOUNTER — Ambulatory Visit: Payer: 59 | Admitting: Psychology

## 2013-08-26 ENCOUNTER — Telehealth: Payer: Self-pay | Admitting: Internal Medicine

## 2013-08-26 MED ORDER — RIVAROXABAN 20 MG PO TABS: 20.0000 mg | ORAL_TABLET | ORAL | Status: DC | PRN

## 2013-08-26 NOTE — Telephone Encounter (Signed)
Pt states he was to have Rivaroxaban (XARELTO) 20 MG TABS filled after his visit 10/13. But its not there. pls advise [facility] Pt going out of town and needs tomorrow.

## 2013-08-26 NOTE — Telephone Encounter (Signed)
rx sent in electronically 

## 2013-08-27 ENCOUNTER — Ambulatory Visit (INDEPENDENT_AMBULATORY_CARE_PROVIDER_SITE_OTHER): Payer: Medicare Other | Admitting: Internal Medicine

## 2013-08-27 ENCOUNTER — Telehealth: Payer: Self-pay | Admitting: Internal Medicine

## 2013-08-27 ENCOUNTER — Encounter: Payer: Self-pay | Admitting: Internal Medicine

## 2013-08-27 VITALS — BP 140/90 | HR 82 | Temp 97.9°F | Resp 20 | Wt 204.0 lb

## 2013-08-27 DIAGNOSIS — I10 Essential (primary) hypertension: Secondary | ICD-10-CM

## 2013-08-27 DIAGNOSIS — E785 Hyperlipidemia, unspecified: Secondary | ICD-10-CM

## 2013-08-27 DIAGNOSIS — I8000 Phlebitis and thrombophlebitis of superficial vessels of unspecified lower extremity: Secondary | ICD-10-CM

## 2013-08-27 DIAGNOSIS — I8002 Phlebitis and thrombophlebitis of superficial vessels of left lower extremity: Secondary | ICD-10-CM

## 2013-08-27 NOTE — Patient Instructions (Addendum)
Phlebitis   Phlebitis is a redness, tenderness and soreness (inflammation) in a vein. This can occur in your arms, legs, or torso (trunk), as well as deeper inside your body.   CAUSES   Phlebitis can be triggered by multiple factors. These include:   Reduced (restricted) blood flow through your veins. This happens with prolonged bed rest, long distance travel, injury or surgery. Being overweight (obese) and pregnant can also restrict blood flow and lead to phlebitis.   Putting a catheter in the vein (intravenous or IV) and giving certain medications through in the vein (intravenously).   Cancer and cancer treatment.   Use of illegal intravenous drugs.   Inflammatory diseases.   Inherited (genetic) diseases that increase the risk for blood clots.   Hormone therapy (such as birth control pills).  SYMPTOMS   Red, tender, swollen, painful area on your skin.   Usually, the area will be long and narrow.   Low grade fever.   Significant firmness along the center of this area. This can indicate that a blood clot has formed.   Surrounding redness or a high fever, which can indicate an infection (cellulitis).  DIAGNOSIS   The appearance of your condition and your symptoms will cause your caregiver to suspect phlebitis. Usually, this is enough for a diagnosis.   Your caregiver may request blood tests or an ultrasound test of the area to be sure you do not have an infection or a blood clot. Blood tests and discussing your family history may also indicate if you have an underlying genetic disease that causes blood clots.   Occasionally, a piece of tissue is taken from the body (biopsy) if an unusual cause of phlebitis is suspected.  TREATMENT   Raise (elevate) the affected area above the level of the heart.   Apply a warm compress or heating pad for 20 minutes, 3 or 4 times a day. If you use an electric heating pad, follow the directions so you do not burn yourself.   Anti-inflammatory medications are usually recommended. Follow  your caregiver's directions.   Any IV catheter, if present, will be removed by your caregiver.   Your caregiver may prescribe medicines that kill germs (antibiotics) if an infection is present.   Your caregiver may recommend blood thinners if a blood clot is suspected or present.   Support stockings or bandages may be helpful, depending on the cause and location of the phlebitis.   Surgery may be needed to remove very damaged sections of vein, but this is rare.  HOME CARE INSTRUCTIONS   Take medications exactly as prescribed.   Follow up with your caregiver as directed.   Use support stockings or bandages if advised. These will speed healing and prevent recurrence.   If you are on blood thinners:   Do follow-up blood tests exactly as directed.   Check with your caregiver before using any new medications.   Wear a pendant to show that you are on blood thinners.   For phlebitis in the legs:   Avoid prolonged standing or bed rest.   Keep your legs moving. Raise your legs with sitting or lying.   Do not smoke.   Women, particularly those over the age of 35, should consider the risks and benefits of taking the contraceptive pill. This kind of hormone treatment can increase your risk for blood clots.  SEEK MEDICAL CARE IF:   You have unusual bruising or any bleeding problems.   Swelling or pain in your   onset of chest pain or difficulty breathing. Document Released: 10/22/2001 Document Revised: 01/20/2012 Document Reviewed: 07/24/2009 [facility] Patient Information 2014 [place], [place].   Call or return to clinic prn if these symptoms worsen or fail to improve as anticipated.

## 2013-08-27 NOTE — Telephone Encounter (Signed)
Dr Kirtland Bouchard gave samples

## 2013-08-27 NOTE — Progress Notes (Signed)
Subjective:    Patient ID: [name], male    DOB: [birth year], 67 y.o.   MRN: [id number]  HPI 67 year old patient who presents with a two-day history of a painful nodule in the left upper inner thigh. Past medical history is pertinent for A large occlusive pulmonary embolism in 2011. He has a history of superficial varicosities more involving his right leg. Stable medical problems include dyslipidemia hypertension and a history of paroxysmal atrial fibrillation.  He will be traveling to [place] soon and uses Zarelto for DVT prophylaxis. Samples provided today. He denies any pulmonary complaints.  Past Medical History  Diagnosis Date  . Hypertension   . Hyperlipidemia   . Atrial fibrillation   . Mitral regurgitation   . Hypercholesteremia   . Insomnia   . Aortic regurgitation   . History of prostate cancer   . Anticoagulants causing adverse effect in therapeutic use     History   Social History  . Marital Status: Single    Spouse Name: N/A    Number of Children: N/A  . Years of Education: N/A   Occupational History  . Not on file.   Social History Main Topics  . Smoking status: Former Smoker -- 1.00 packs/day    Types: Cigarettes    Quit date: 01/29/1981  . Smokeless tobacco: Never Used  . Alcohol Use: Yes     Comment: occasional  . Drug Use: No  . Sexual Activity: Not on file   Other Topics Concern  . Not on file   Social History Narrative  . No narrative on file    Past Surgical History  Procedure Laterality Date  . Cardiac catheterization    . Penile prosthesis implant    . Prostate surgery    . Cataract extraction      L 12/06/09   R 09/14/09  . Aortic valve replacement  2011    [facility]     Family History  Problem Relation Age of Onset  . Hyperlipidemia Other   . Hypertension Other   . Cancer      prostate  . Cancer Sister     uterine  . Colon cancer Sister 14    Allergies  Allergen Reactions  . Hydrocodone-Acetaminophen Shortness Of Breath  .  Oxycodone Hcl Shortness Of Breath  . Aspirin     Can't take high dose  . Atorvastatin     REACTION: myalgia  . Buspirone Hcl     REACTION: headache  . Codeine Phosphate     REACTION: upset stomach, itching  . Morphine Sulfate     REACTION: itching  . Rabeprazole     REACTION: headache  . Ramipril     REACTION: unspecified  . Telmisartan-Hctz     REACTION: sob,dizzy,palpitations    Current Outpatient Prescriptions on File Prior to Visit  Medication Sig Dispense Refill  . aspirin 81 MG tablet Take 81 mg by mouth daily.        Marland Kitchen imipramine (TOFRANIL) 10 MG tablet TAKE 1 TABLET (10 MG TOTAL) BY MOUTH AT BEDTIME AS NEEDED.  90 tablet  1  . LORazepam (ATIVAN) 0.5 MG tablet TAKE 1/2 TO 1 TABLET BY MOUTH EVERY DAY AS NEEDED  30 tablet  2  . metoprolol succinate (TOPROL-XL) 25 MG 24 hr tablet Take 1 tablet (25 mg total) by mouth daily.  30 tablet  11  . naproxen (NAPROSYN) 500 MG tablet TAKE 1 TABLET BY MOUTH TWICE A DAY WITH A MEAL  180 tablet  1  . niacin 500 MG tablet Take 500 mg by mouth daily with breakfast.      . Omega-3 Fatty Acids (FISH OIL) 1000 MG CAPS Take by mouth daily.        . Rivaroxaban (XARELTO) 20 MG TABS tablet Take 1 tablet (20 mg total) by mouth as needed (takes only when flying).  30 tablet  0  . venlafaxine XR (EFFEXOR-XR) 37.5 MG 24 hr capsule Take 1 capsule (37.5 mg total) by mouth daily.  90 capsule  3   No current facility-administered medications on file prior to visit.    BP 140/90  Pulse 82  Temp(Src) 97.9 F (36.6 C) (Oral)  Resp 20  Wt 204 lb (92.534 kg)  BMI 29.27 kg/m2  SpO2 98%         Review of Systems  Constitutional: Negative for fever, chills, appetite change and fatigue.  HENT: Negative for congestion, dental problem, ear pain, hearing loss, sore throat, tinnitus, trouble swallowing and voice change.   Eyes: Negative for pain, discharge and visual disturbance.  Respiratory: Negative for cough, chest tightness, wheezing and stridor.    Cardiovascular: Negative for chest pain, palpitations and leg swelling.  Gastrointestinal: Negative for nausea, vomiting, abdominal pain, diarrhea, constipation, blood in stool and abdominal distention.  Genitourinary: Negative for urgency, hematuria, flank pain, discharge, difficulty urinating and genital sores.  Musculoskeletal: Negative for arthralgias, back pain, gait problem, joint swelling, myalgias and neck stiffness.  Skin: Negative for rash.  Neurological: Negative for dizziness, syncope, speech difficulty, weakness, numbness and headaches.  Hematological: Negative for adenopathy. Does not bruise/bleed easily.  Psychiatric/Behavioral: Negative for behavioral problems and dysphoric mood. The patient is not nervous/anxious.        Objective:   Physical Exam  Constitutional: He appears well-developed and well-nourished. No distress.  Repeat blood pressure 130/88  Skin:  A slightly tender superficial varix noted involving the right mid inner thigh region          Assessment & Plan:  Superficial phlebitis. The patient was treated with warm compresses and anti-inflammatory medications. Samples of Xarelto dispense for his transatlantic flight next week. Hypertension History of pulmonary embolism

## 2013-08-27 NOTE — Telephone Encounter (Addendum)
Pt needs samples of xarelto until PA has been approve. Pt has appt with Dr Kirtland Bouchard this morning

## 2013-09-09 ENCOUNTER — Ambulatory Visit: Payer: 59 | Admitting: Psychology

## 2013-09-18 ENCOUNTER — Other Ambulatory Visit: Payer: Self-pay | Admitting: Internal Medicine

## 2013-09-23 ENCOUNTER — Ambulatory Visit (INDEPENDENT_AMBULATORY_CARE_PROVIDER_SITE_OTHER): Payer: 59 | Admitting: Psychology

## 2013-09-23 DIAGNOSIS — F4321 Adjustment disorder with depressed mood: Secondary | ICD-10-CM

## 2013-10-06 ENCOUNTER — Ambulatory Visit (INDEPENDENT_AMBULATORY_CARE_PROVIDER_SITE_OTHER): Payer: 59 | Admitting: Psychology

## 2013-10-06 DIAGNOSIS — F4321 Adjustment disorder with depressed mood: Secondary | ICD-10-CM

## 2013-10-19 ENCOUNTER — Ambulatory Visit (INDEPENDENT_AMBULATORY_CARE_PROVIDER_SITE_OTHER): Payer: 59 | Admitting: Psychology

## 2013-10-19 DIAGNOSIS — F4321 Adjustment disorder with depressed mood: Secondary | ICD-10-CM

## 2013-10-21 ENCOUNTER — Ambulatory Visit: Payer: 59 | Admitting: Psychology

## 2013-11-08 ENCOUNTER — Telehealth: Payer: Self-pay | Admitting: Internal Medicine

## 2013-11-08 ENCOUNTER — Telehealth: Payer: Self-pay | Admitting: Cardiovascular Disease

## 2013-11-08 NOTE — Telephone Encounter (Signed)
New message  Pt called states that he went to the hospital for a cold/ Cough and they found a heart murmur// Pt is requesting a same day appt if possible// please assist

## 2013-11-08 NOTE — Telephone Encounter (Signed)
FYI:  Pt went to [facility] over the weekend and has some concerns about his bp and heart valve.  He was told to follow up with cardiologist and pcp for hypertension and slight murmur heard at his aorta valve.  Pt has left message with cardiologist and is waiting for call back. Pt also states that he was given a z pack and Benzonatate for a cough and it is not working too well.  Wants to know can something be called in for him.  I explained to him that without being seen, I doubt it.  Please note. Thanks

## 2013-11-08 NOTE — Telephone Encounter (Signed)
SPOKE WITH  PT  APPT   MADE FOR    11-23-12 AT  4:15  .Zack Seal

## 2013-11-09 ENCOUNTER — Encounter: Payer: Self-pay | Admitting: Family Medicine

## 2013-11-09 ENCOUNTER — Ambulatory Visit (INDEPENDENT_AMBULATORY_CARE_PROVIDER_SITE_OTHER): Payer: Medicare Other | Admitting: Family Medicine

## 2013-11-09 VITALS — BP 125/93 | HR 98 | Temp 96.9°F | Resp 18 | Ht 70.0 in | Wt 200.0 lb

## 2013-11-09 DIAGNOSIS — J208 Acute bronchitis due to other specified organisms: Secondary | ICD-10-CM

## 2013-11-09 DIAGNOSIS — J209 Acute bronchitis, unspecified: Secondary | ICD-10-CM

## 2013-11-09 MED ORDER — PREDNISONE 20 MG PO TABS: ORAL_TABLET | ORAL | Status: DC

## 2013-11-09 MED ORDER — ALBUTEROL SULFATE HFA 108 (90 BASE) MCG/ACT IN AERS: 2.0000 | INHALATION_SPRAY | RESPIRATORY_TRACT | Status: DC | PRN

## 2013-11-09 NOTE — Progress Notes (Signed)
Pre visit review using our clinic review tool, if applicable. No additional management support is needed unless otherwise documented below in the visit note. OFFICE NOTE  11/09/2013  CC:  Chief Complaint  Patient presents with  . URI    saw someone at [facility]   . Cough  . Generalized Body Aches     HPI: Patient is a 67 y.o. African-American male who is here for cough. Onset of mild throat irritation and cough about 8 d/a, sx's gradually worsened and he went to [facility] and was rx'd a z-pack and tessalon perles.  Says his cough is still quite problematic, some evening wheezing.  +Prominent malaise and upper body aches still. Has not had any fevers.  He got a flu vaccine this season.  Mucinex DM of little help.  Pertinent PMH:  Past Medical History  Diagnosis Date  . Hypertension   . Hyperlipidemia   . Atrial fibrillation   . Mitral regurgitation   . Hypercholesteremia   . Insomnia   . Aortic regurgitation   . History of prostate cancer   . Anticoagulants causing adverse effect in therapeutic use     MEDS:  Outpatient Prescriptions Prior to Visit  Medication Sig Dispense Refill  . aspirin 81 MG tablet Take 81 mg by mouth daily.        Marland Kitchen imipramine (TOFRANIL) 10 MG tablet TAKE 1 TABLET (10 MG TOTAL) BY MOUTH AT BEDTIME AS NEEDED.  90 tablet  1  . LORazepam (ATIVAN) 0.5 MG tablet TAKE 1/2 TO 1 TABLET EVERY DAY AS NEEDED  30 tablet  3  . metoprolol succinate (TOPROL-XL) 25 MG 24 hr tablet Take 1 tablet (25 mg total) by mouth daily.  30 tablet  11  . naproxen (NAPROSYN) 500 MG tablet TAKE 1 TABLET BY MOUTH TWICE A DAY WITH A MEAL  180 tablet  1  . niacin 500 MG tablet Take 500 mg by mouth daily with breakfast.      . Omega-3 Fatty Acids (FISH OIL) 1000 MG CAPS Take by mouth daily.        . Rivaroxaban (XARELTO) 20 MG TABS tablet Take 1 tablet (20 mg total) by mouth as needed (takes only when flying).  30 tablet  0  . venlafaxine XR (EFFEXOR-XR) 37.5 MG 24 hr capsule Take 1  capsule (37.5 mg total) by mouth daily.  90 capsule  3   No facility-administered medications prior to visit.    PE: Blood pressure 125/93, pulse 98, temperature 96.9 F (36.1 C), temperature source Temporal, resp. rate 18, height 5\' 10"  (1.778 m), weight 200 lb (90.719 kg), SpO2 98.00%. VS: noted--normal. Gen: alert, NAD, NONTOXIC APPEARING. HEENT: eyes without injection, drainage, or swelling.  Ears: EACs clear, TMs with normal light reflex and landmarks.  Nose: Clear rhinorrhea, with some dried, crusty exudate adherent to mildly injected mucosa.  No purulent d/c.  No paranasal sinus TTP.  No facial swelling.  Throat and mouth without focal lesion.  No pharyngial swelling, erythema, or exudate.   Neck: supple, no LAD.   LUNGS: CTA bilat, nonlabored resps.  Some post exhalation coughing.  Good aeration. CV: RRR, no m/r/g. EXT: no c/c/e SKIN: no rash  IMPRESSION AND PLAN:  Viral bronchitis Prednisone 40 mg qd x 5d. Ventolin HFA 2 p q4h prn. Finish z-pack rx'd by Susann Givens MD. Mucinex DM recommended. Therapeutic expectations and side effect profile of medications discussed today.  Patient's questions answered. CMA did inhaler education with pt today.  An After Visit Summary was printed and given to the patient.  FOLLOW UP: prn

## 2013-11-09 NOTE — Telephone Encounter (Signed)
Appt scheduled in [place]

## 2013-11-09 NOTE — Progress Notes (Signed)
Pre visit review using our clinic review tool, if applicable. No additional management support is needed unless otherwise documented below in the visit note. 

## 2013-11-09 NOTE — Assessment & Plan Note (Signed)
Prednisone 40 mg qd x 5d. Ventolin HFA 2 p q4h prn. Finish z-pack rx'd by Susann Givens MD. Mucinex DM recommended. Therapeutic expectations and side effect profile of medications discussed today.  Patient's questions answered. CMA did inhaler education with pt today.

## 2013-11-09 NOTE — Patient Instructions (Signed)
Take mucinex DM twice a day. Stop benzonatate (tessalon perles).

## 2013-11-11 DIAGNOSIS — C9110 Chronic lymphocytic leukemia of B-cell type not having achieved remission: Secondary | ICD-10-CM | POA: Insufficient documentation

## 2013-11-11 DIAGNOSIS — R911 Solitary pulmonary nodule: Secondary | ICD-10-CM

## 2013-11-11 HISTORY — DX: Solitary pulmonary nodule: R91.1

## 2013-11-22 ENCOUNTER — Telehealth: Payer: Self-pay | Admitting: Cardiovascular Disease

## 2013-11-22 NOTE — Telephone Encounter (Signed)
LMTCB ./CY  LMTCB ONCE AGAIN  CALLING ONLY  TO  SEE IF   PT  MAY  COME IN  AT  2:45 PM  INSTEAD OF  4:15 PM WILL AWAIT  PT'S RETURN CALL./CY

## 2013-11-22 NOTE — Telephone Encounter (Signed)
PT  COMING AT   2:45 PM .Adonis Housekeeper

## 2013-11-22 NOTE — Telephone Encounter (Signed)
New message ° ° ° ° °Returning Christine's call °

## 2013-11-23 ENCOUNTER — Encounter: Payer: Self-pay | Admitting: Cardiovascular Disease

## 2013-11-23 ENCOUNTER — Ambulatory Visit (INDEPENDENT_AMBULATORY_CARE_PROVIDER_SITE_OTHER): Payer: Medicare Other | Admitting: Cardiovascular Disease

## 2013-11-23 VITALS — BP 164/100 | HR 78 | Ht 70.0 in | Wt 203.0 lb

## 2013-11-23 DIAGNOSIS — I2699 Other pulmonary embolism without acute cor pulmonale: Secondary | ICD-10-CM

## 2013-11-23 DIAGNOSIS — Z954 Presence of other heart-valve replacement: Secondary | ICD-10-CM

## 2013-11-23 DIAGNOSIS — Z952 Presence of prosthetic heart valve: Secondary | ICD-10-CM

## 2013-11-23 DIAGNOSIS — I359 Nonrheumatic aortic valve disorder, unspecified: Secondary | ICD-10-CM

## 2013-11-23 DIAGNOSIS — I10 Essential (primary) hypertension: Secondary | ICD-10-CM

## 2013-11-23 DIAGNOSIS — F411 Generalized anxiety disorder: Secondary | ICD-10-CM

## 2013-11-23 DIAGNOSIS — I4891 Unspecified atrial fibrillation: Secondary | ICD-10-CM

## 2013-11-23 NOTE — Assessment & Plan Note (Signed)
Much improved Enjoying his retirement Dr Leanne Chang adjusting antidepressants

## 2013-11-23 NOTE — Assessment & Plan Note (Signed)
Some white coat component Normal readings at home stable

## 2013-11-23 NOTE — Assessment & Plan Note (Signed)
NSR on beta blocker and chronic xarelto

## 2013-11-23 NOTE — Progress Notes (Signed)
Patient ID: [name], male   DOB: [birth year], 68 y.o.   MRN: [id number] [name] is seen for F/U of anticoagulation post AVR, history of PAF. DVT with large bilateral PE 9/11. F/U CT and LE [name] with resolving thrombus burden 09/18/10. Fairly long period of subRx anticoagulation on 2 separate courses of Lovenox. INR now Rx. Still some dyspnea and fatigue. No SSCP, palpitations, edema. No bleeding problems. Duplex done 01/18/11 showed Some partial nonoccluding clot in SFA and proximal popliteal. He really wants to come off coumadin. He will finish a year of anticoagulation at the end of this month and has good resolution of thrombus burden by imaging Recent Dx of low grade CLL. Sees [employer]. Taking prophylactic Xarelto for flights.  Will retire in May. Just bought new S4 and redid his backyard      ROS: Denies fever, malais, weight loss, blurry vision, decreased visual acuity, cough, sputum, SOB, hemoptysis, pleuritic pain, palpitaitons, heartburn, abdominal pain, melena, lower extremity edema, claudication, or rash.  All other systems reviewed and negative  General: Affect appropriate Healthy:  appears stated age 46: normal Neck supple with no adenopathy JVP normal no bruits no thyromegaly Lungs clear with no wheezing and good diaphragmatic motion Heart:  S1/S2 SEM through tissue valve no AR  murmur, no rub, gallop or click PMI normal Abdomen: benighn, BS positve, no tenderness, no AAA no bruit.  No HSM or HJR Distal pulses intact with no bruits No edema Neuro non-focal Skin warm and dry No muscular weakness   Current Outpatient Prescriptions  Medication Sig Dispense Refill  . albuterol (VENTOLIN HFA) 108 (90 BASE) MCG/ACT inhaler Inhale 2 puffs into the lungs every 4 (four) hours as needed for wheezing or shortness of breath.  1 Inhaler  0  . aspirin 81 MG tablet Take 81 mg by mouth daily.        Marland Kitchen imipramine (TOFRANIL) 10 MG tablet TAKE 1 TABLET (10 MG TOTAL) BY MOUTH AT BEDTIME AS  NEEDED.  90 tablet  1  . LORazepam (ATIVAN) 0.5 MG tablet TAKE 1/2 TO 1 TABLET EVERY DAY AS NEEDED  30 tablet  3  . metoprolol succinate (TOPROL-XL) 25 MG 24 hr tablet Take 1 tablet (25 mg total) by mouth daily.  30 tablet  11  . naproxen (NAPROSYN) 500 MG tablet TAKE 1 TABLET BY MOUTH TWICE A DAY WITH A MEAL  180 tablet  1  . niacin 500 MG tablet Take 500 mg by mouth daily with breakfast.      . Omega-3 Fatty Acids (FISH OIL) 1000 MG CAPS Take by mouth daily.        . Rivaroxaban (XARELTO) 20 MG TABS tablet Take 1 tablet (20 mg total) by mouth as needed (takes only when flying).  30 tablet  0  . venlafaxine XR (EFFEXOR-XR) 37.5 MG 24 hr capsule Take 1 capsule (37.5 mg total) by mouth daily.  90 capsule  3   No current facility-administered medications for this visit.    Allergies  Hydrocodone-acetaminophen; Oxycodone hcl; Aspirin; Atorvastatin; Buspirone hcl; Codeine phosphate; Morphine sulfate; Rabeprazole; Ramipril; and Telmisartan-hctz  Electrocardiogram:  SR RBBB LAFB  LVH rate 78  No change from 02/20/12    Assessment and Plan

## 2013-11-23 NOTE — Patient Instructions (Signed)
Your physician wants you to follow-up in:    6  MONTHS WITH DR NISHAN  You will receive a reminder letter in the mail two months in advance. If you don't receive a letter, please call our office to schedule the follow-up appointment. Your physician recommends that you continue on your current medications as directed. Please refer to the Current Medication list given to you today. Your physician has requested that you have an echocardiogram. Echocardiography is a painless test that uses sound waves to create images of your heart. It provides your doctor with information about the size and shape of your heart and how well your heart's chambers and valves are working. This procedure takes approximately one hour. There are no restrictions for this procedure.  

## 2013-11-23 NOTE — Assessment & Plan Note (Signed)
Done at [facility] ? 3 years ago  No AR on exam F/U echo [facility]

## 2013-11-23 NOTE — Assessment & Plan Note (Signed)
Chronic anticoagulation no post phlebotic syndrome improved

## 2013-11-25 ENCOUNTER — Ambulatory Visit (INDEPENDENT_AMBULATORY_CARE_PROVIDER_SITE_OTHER): Payer: 59 | Admitting: Psychology

## 2013-11-25 DIAGNOSIS — F4321 Adjustment disorder with depressed mood: Secondary | ICD-10-CM

## 2013-12-09 ENCOUNTER — Ambulatory Visit (HOSPITAL_COMMUNITY): Payer: Medicare Other | Attending: Cardiology | Admitting: Radiology

## 2013-12-09 ENCOUNTER — Ambulatory Visit (INDEPENDENT_AMBULATORY_CARE_PROVIDER_SITE_OTHER): Payer: 59 | Admitting: Psychology

## 2013-12-09 ENCOUNTER — Encounter: Payer: Self-pay | Admitting: Cardiology

## 2013-12-09 DIAGNOSIS — I359 Nonrheumatic aortic valve disorder, unspecified: Secondary | ICD-10-CM

## 2013-12-09 DIAGNOSIS — E785 Hyperlipidemia, unspecified: Secondary | ICD-10-CM | POA: Insufficient documentation

## 2013-12-09 DIAGNOSIS — T82897A Other specified complication of cardiac prosthetic devices, implants and grafts, initial encounter: Secondary | ICD-10-CM | POA: Insufficient documentation

## 2013-12-09 DIAGNOSIS — I059 Rheumatic mitral valve disease, unspecified: Secondary | ICD-10-CM | POA: Insufficient documentation

## 2013-12-09 DIAGNOSIS — I253 Aneurysm of heart: Secondary | ICD-10-CM | POA: Insufficient documentation

## 2013-12-09 DIAGNOSIS — F4321 Adjustment disorder with depressed mood: Secondary | ICD-10-CM

## 2013-12-09 DIAGNOSIS — Z86711 Personal history of pulmonary embolism: Secondary | ICD-10-CM | POA: Insufficient documentation

## 2013-12-09 DIAGNOSIS — I079 Rheumatic tricuspid valve disease, unspecified: Secondary | ICD-10-CM | POA: Insufficient documentation

## 2013-12-09 DIAGNOSIS — Z86718 Personal history of other venous thrombosis and embolism: Secondary | ICD-10-CM | POA: Insufficient documentation

## 2013-12-09 DIAGNOSIS — Y831 Surgical operation with implant of artificial internal device as the cause of abnormal reaction of the patient, or of later complication, without mention of misadventure at the time of the procedure: Secondary | ICD-10-CM | POA: Insufficient documentation

## 2013-12-09 DIAGNOSIS — Z952 Presence of prosthetic heart valve: Secondary | ICD-10-CM

## 2013-12-09 DIAGNOSIS — I379 Nonrheumatic pulmonary valve disorder, unspecified: Secondary | ICD-10-CM | POA: Insufficient documentation

## 2013-12-09 NOTE — Progress Notes (Signed)
Echocardiogram performed.  

## 2013-12-13 ENCOUNTER — Ambulatory Visit (INDEPENDENT_AMBULATORY_CARE_PROVIDER_SITE_OTHER): Payer: Medicare Other | Admitting: Family Medicine

## 2013-12-13 ENCOUNTER — Encounter: Payer: Self-pay | Admitting: Family Medicine

## 2013-12-13 VITALS — BP 153/92 | HR 73 | Temp 97.9°F | Resp 18 | Ht 70.0 in | Wt 206.0 lb

## 2013-12-13 DIAGNOSIS — R0982 Postnasal drip: Secondary | ICD-10-CM

## 2013-12-13 DIAGNOSIS — R05 Cough: Secondary | ICD-10-CM

## 2013-12-13 DIAGNOSIS — R059 Cough, unspecified: Secondary | ICD-10-CM

## 2013-12-13 MED ORDER — FLUTICASONE PROPIONATE 50 MCG/ACT NA SUSP: NASAL | Status: DC

## 2013-12-13 NOTE — Progress Notes (Signed)
OFFICE NOTE  12/13/2013  CC:  Chief Complaint  Patient presents with  . Follow-up    bronchitis  . Cough     HPI: Patient is a 68 y.o. African-American male who is here for cough. Cough on and off since I saw him last about a month ago. Was able to stop mucinex 2 wks ago or so.  Last night had return of worsened cough, describes feeling stuff collect in throat and strong urge to cough with this.  No fever.  No SOB.  No hemoptysis.  No chest pain.  Feels some PND in throat.  Not much nasal symptoms. Took mucinex last night and today.  Albut inhaler helps sometimes but often initially makes cough worse. Denies GER.   Pertinent PMH:  Past Medical History  Diagnosis Date  . Hypertension   . Hyperlipidemia   . Atrial fibrillation   . Mitral regurgitation   . History of pulmonary embolism   . Aortic regurgitation     AVR  . History of prostate cancer   . Anticoagulants causing adverse effect in therapeutic use    Past Surgical History  Procedure Laterality Date  . Cardiac catheterization    . Penile prosthesis implant    . Prostate surgery    . Cataract extraction      L 12/06/09   R 09/14/09  . Aortic valve replacement  2011    [facility]     MEDS:  Outpatient Prescriptions Prior to Visit  Medication Sig Dispense Refill  . albuterol (VENTOLIN HFA) 108 (90 BASE) MCG/ACT inhaler Inhale 2 puffs into the lungs every 4 (four) hours as needed for wheezing or shortness of breath.  1 Inhaler  0  . aspirin 81 MG tablet Take 81 mg by mouth daily.        Marland Kitchen imipramine (TOFRANIL) 10 MG tablet TAKE 1 TABLET (10 MG TOTAL) BY MOUTH AT BEDTIME AS NEEDED.  90 tablet  1  . LORazepam (ATIVAN) 0.5 MG tablet TAKE 1/2 TO 1 TABLET EVERY DAY AS NEEDED  30 tablet  3  . metoprolol succinate (TOPROL-XL) 25 MG 24 hr tablet Take 1 tablet (25 mg total) by mouth daily.  30 tablet  11  . naproxen (NAPROSYN) 500 MG tablet TAKE 1 TABLET BY MOUTH TWICE A DAY WITH A MEAL  180 tablet  1  . niacin 500 MG tablet  Take 500 mg by mouth daily with breakfast.      . Omega-3 Fatty Acids (FISH OIL) 1000 MG CAPS Take by mouth daily.        . Rivaroxaban (XARELTO) 20 MG TABS tablet Take 1 tablet (20 mg total) by mouth as needed (takes only when flying).  30 tablet  0  . venlafaxine XR (EFFEXOR-XR) 37.5 MG 24 hr capsule Take 1 capsule (37.5 mg total) by mouth daily.  90 capsule  3   No facility-administered medications prior to visit.    PE: Blood pressure 153/92, pulse 73, temperature 97.9 F (36.6 C), temperature source Temporal, resp. rate 18, height 5\' 10"  (1.778 m), weight 206 lb (93.441 kg), SpO2 96.00%. VS: noted--bp up.  Recheck of bp with manual cuff after pt in room 15 min was 132/76. Gen: alert, NAD, NONTOXIC APPEARING. HEENT: eyes without injection, drainage, or swelling.  Ears: EACs clear, TMs with normal light reflex and landmarks.  Nose: Clear rhinorrhea, with some dried, crusty exudate adherent to mildly injected mucosa.  No purulent d/c.  No paranasal sinus TTP.  No facial swelling.  Throat and mouth without focal lesion.  No pharyngial swelling, erythema, or exudate.   Neck: supple, no LAD.   LUNGS: CTA bilat, nonlabored resps.   CV: RRR, no m/r/g. EXT: no c/c/e SKIN: no rash  LABS: none  IMPRESSION AND PLAN:  Cough, suspect PND cough. Will start flonase daily. Therapeutic expectations and side effect profile of medication discussed today.  Patient's questions answered. I don't see any signs of bacterial infection or RAD. He can d/c use of albuterol for his current cough.  An After Visit Summary was printed and given to the patient.  FOLLOW UP: prn

## 2013-12-13 NOTE — Progress Notes (Signed)
Pre visit review using our clinic review tool, if applicable. No additional management support is needed unless otherwise documented below in the visit note. 

## 2013-12-23 ENCOUNTER — Ambulatory Visit: Payer: 59 | Admitting: Psychology

## 2014-01-06 ENCOUNTER — Ambulatory Visit: Payer: 59 | Admitting: Psychology

## 2014-01-20 ENCOUNTER — Other Ambulatory Visit: Payer: Self-pay | Admitting: Internal Medicine

## 2014-01-20 ENCOUNTER — Ambulatory Visit (INDEPENDENT_AMBULATORY_CARE_PROVIDER_SITE_OTHER): Payer: 59 | Admitting: Psychology

## 2014-01-20 DIAGNOSIS — F4321 Adjustment disorder with depressed mood: Secondary | ICD-10-CM

## 2014-02-02 ENCOUNTER — Ambulatory Visit (INDEPENDENT_AMBULATORY_CARE_PROVIDER_SITE_OTHER): Payer: 59 | Admitting: Psychology

## 2014-02-02 DIAGNOSIS — F4321 Adjustment disorder with depressed mood: Secondary | ICD-10-CM

## 2014-02-03 ENCOUNTER — Ambulatory Visit: Payer: 59 | Admitting: Psychology

## 2014-02-07 ENCOUNTER — Encounter: Payer: Self-pay | Admitting: Family Medicine

## 2014-02-07 ENCOUNTER — Ambulatory Visit (INDEPENDENT_AMBULATORY_CARE_PROVIDER_SITE_OTHER): Payer: Medicare Other | Admitting: Family Medicine

## 2014-02-07 VITALS — BP 146/100 | HR 68 | Temp 97.7°F | Resp 16 | Ht 70.0 in | Wt 201.0 lb

## 2014-02-07 DIAGNOSIS — R42 Dizziness and giddiness: Secondary | ICD-10-CM | POA: Insufficient documentation

## 2014-02-07 NOTE — Progress Notes (Signed)
Pre visit review using our clinic review tool, if applicable. No additional management support is needed unless otherwise documented below in the visit note. 

## 2014-02-07 NOTE — Progress Notes (Signed)
sOFFICE NOTE  02/07/2014  CC:  Chief Complaint  Patient presents with  . Dizziness    started yesterday     HPI: Patient is a 68 y.o. African-American male who is here for "lightheaded". Last couple of days describes intermittent feeling of disequilibrium, lasts approx 10-20 min.   Has some mild nausea assoc with this but no vomiting.  Also feels a soreness in his eyes and blurring of vision during these times.  No hearing changes or ringing in ears.  HA in occipital and retro-auricular regions on/off lately since starting PT for his neck.  Denies palpitations or chest pain.  Yesterday an episode occurred when he bent down to tie shoe.  Felt it when walking this morning. Still a feeling of head fullness.  Feels better sitting or lying down. Drinks a decent amount of water.  Stopped muscle relaxer rx'd for his chronic osteoarthritic neck pain--stopped this about 1 week ago for scalp tenderness.  Another new med he has recently been taking is mobic. No recent URI/sinusitis/resp sx's.    Pertinent PMH:  Past Medical History  Diagnosis Date  . Hypertension   . Hyperlipidemia   . Atrial fibrillation   . Mitral regurgitation   . History of pulmonary embolism 2011  . Aortic regurgitation     AVR  . History of prostate cancer   . Anticoagulants causing adverse effect in therapeutic use    Past Surgical History  Procedure Laterality Date  . Cardiac catheterization    . Penile prosthesis implant    . Prostate surgery    . Cataract extraction      L 12/06/09   R 09/14/09  . Aortic valve replacement  2011    [facility]     MEDS: meloxicam 15mg  qd. Not taking naproxen or ASA or Xarelto at this time. No recent albuterol use.  Outpatient Prescriptions Prior to Visit  Medication Sig Dispense Refill  . fluticasone (FLONASE) 50 MCG/ACT nasal spray 2 sprays each nostril once every day  16 g  6  . imipramine (TOFRANIL) 10 MG tablet TAKE 1 TABLET (10 MG TOTAL) BY MOUTH AT BEDTIME AS NEEDED.   90 tablet  1  . LORazepam (ATIVAN) 0.5 MG tablet TAKE 1/2 TO 1 TABLET BY MOUTH EVERY DAY AS NEEDED  30 tablet  3  . metoprolol succinate (TOPROL-XL) 25 MG 24 hr tablet Take 1 tablet (25 mg total) by mouth daily.  30 tablet  11  . niacin 500 MG tablet Take 500 mg by mouth daily with breakfast.      . Omega-3 Fatty Acids (FISH OIL) 1000 MG CAPS Take by mouth daily.        . Rivaroxaban (XARELTO) 20 MG TABS tablet Take 1 tablet (20 mg total) by mouth as needed (takes only when flying).  30 tablet  0  . venlafaxine XR (EFFEXOR-XR) 37.5 MG 24 hr capsule Take 1 capsule (37.5 mg total) by mouth daily.  90 capsule  3  . albuterol (VENTOLIN HFA) 108 (90 BASE) MCG/ACT inhaler Inhale 2 puffs into the lungs every 4 (four) hours as needed for wheezing or shortness of breath.  1 Inhaler  0  . aspirin 81 MG tablet Take 81 mg by mouth daily.        . naproxen (NAPROSYN) 500 MG tablet TAKE 1 TABLET BY MOUTH TWICE A DAY WITH A MEAL  180 tablet  1   No facility-administered medications prior to visit.    PE: Blood pressure 146/100, pulse  68, temperature 97.7 F (36.5 C), temperature source Temporal, resp. rate 16, height 5\' 10"  (1.778 m), weight 201 lb (91.173 kg), SpO2 94.00%. Gen: Alert, well appearing.  Patient is oriented to person, place, time, and situation. ENT: Ears: EACs clear, normal epithelium.  TMs with good light reflex and landmarks bilaterally.  Eyes: no injection, icteris, swelling, or exudate.  EOMI, PERRLA. Nose: no drainage or turbinate edema/swelling.  No injection or focal lesion.  Mouth: lips without lesion/swelling.  Oral mucosa pink and moist.  Dentition intact and without obvious caries or gingival swelling.  Oropharynx without erythema, exudate, or swelling.  Neck: supple/nontender.  No LAD, mass, or TM.  Carotid pulses 2+ bilaterally, without bruits. CV: RRR, soft systolic murmur heard in RUSB region, S1 is not as distinct as S2, no rub or gallop. Chest is clear, no wheezing or rales.  Normal symmetric air entry throughout both lung fields. No chest wall deformities or tenderness. Neuro: CN 2-12 intact bilaterally, strength 5/5 in proximal and distal upper extremities and lower extremities bilateral.  No tremor.  No disdiadochokinesis.  No ataxia.  Upper extremity and lower extremity DTRs symmetric.  No pronator drift.   IMPRESSION AND PLAN:  Mild spells of disequilibrium, suspect medication side effect (mobic). Stop mobic and see how things go. No sign of impending cv or neuro problem at this time. No indication for testing at this time. Reassured pt today.  Spent 25 min with pt today, with >50% of this time spent in counseling and care coordination regarding the above problems.  An After Visit Summary was printed and given to the patient.  FOLLOW UP: prn

## 2014-02-15 ENCOUNTER — Ambulatory Visit: Payer: Self-pay | Admitting: Internal Medicine

## 2014-02-17 ENCOUNTER — Ambulatory Visit (INDEPENDENT_AMBULATORY_CARE_PROVIDER_SITE_OTHER): Payer: 59 | Admitting: Psychology

## 2014-02-17 DIAGNOSIS — F4321 Adjustment disorder with depressed mood: Secondary | ICD-10-CM

## 2014-02-23 ENCOUNTER — Ambulatory Visit: Payer: Self-pay | Admitting: Internal Medicine

## 2014-03-02 ENCOUNTER — Other Ambulatory Visit: Payer: Self-pay | Admitting: Internal Medicine

## 2014-03-03 ENCOUNTER — Ambulatory Visit: Payer: 59 | Admitting: Psychology

## 2014-03-17 ENCOUNTER — Ambulatory Visit (INDEPENDENT_AMBULATORY_CARE_PROVIDER_SITE_OTHER): Payer: 59 | Admitting: Psychology

## 2014-03-17 DIAGNOSIS — F4321 Adjustment disorder with depressed mood: Secondary | ICD-10-CM

## 2014-03-31 ENCOUNTER — Ambulatory Visit (INDEPENDENT_AMBULATORY_CARE_PROVIDER_SITE_OTHER): Payer: 59 | Admitting: Psychology

## 2014-03-31 DIAGNOSIS — F4321 Adjustment disorder with depressed mood: Secondary | ICD-10-CM

## 2014-04-08 ENCOUNTER — Other Ambulatory Visit: Payer: Self-pay | Admitting: Physical Medicine and Rehabilitation

## 2014-04-08 DIAGNOSIS — M542 Cervicalgia: Secondary | ICD-10-CM

## 2014-04-11 ENCOUNTER — Ambulatory Visit (INDEPENDENT_AMBULATORY_CARE_PROVIDER_SITE_OTHER): Payer: Medicare Other | Admitting: Internal Medicine

## 2014-04-11 ENCOUNTER — Ambulatory Visit
Admission: RE | Admit: 2014-04-11 | Discharge: 2014-04-11 | Disposition: A | Discharge status: Home / Self Care | Payer: Medicare Other | Source: Ambulatory Visit | Attending: Physical Medicine and Rehabilitation | Admitting: Physical Medicine and Rehabilitation

## 2014-04-11 ENCOUNTER — Encounter: Payer: Self-pay | Admitting: Internal Medicine

## 2014-04-11 ENCOUNTER — Other Ambulatory Visit: Payer: Self-pay | Admitting: Physical Medicine and Rehabilitation

## 2014-04-11 VITALS — BP 135/84 | HR 84 | Temp 98.7°F | Ht 70.0 in | Wt 204.0 lb

## 2014-04-11 VITALS — BP 149/91 | HR 74

## 2014-04-11 DIAGNOSIS — M542 Cervicalgia: Secondary | ICD-10-CM

## 2014-04-11 DIAGNOSIS — I4891 Unspecified atrial fibrillation: Secondary | ICD-10-CM

## 2014-04-11 DIAGNOSIS — I359 Nonrheumatic aortic valve disorder, unspecified: Secondary | ICD-10-CM

## 2014-04-11 DIAGNOSIS — I2699 Other pulmonary embolism without acute cor pulmonale: Secondary | ICD-10-CM

## 2014-04-11 DIAGNOSIS — K219 Gastro-esophageal reflux disease without esophagitis: Secondary | ICD-10-CM

## 2014-04-11 DIAGNOSIS — M79602 Pain in left arm: Secondary | ICD-10-CM

## 2014-04-11 DIAGNOSIS — E785 Hyperlipidemia, unspecified: Secondary | ICD-10-CM

## 2014-04-11 DIAGNOSIS — M79601 Pain in right arm: Secondary | ICD-10-CM

## 2014-04-11 DIAGNOSIS — I10 Essential (primary) hypertension: Secondary | ICD-10-CM

## 2014-04-11 LAB — CBC WITH DIFFERENTIAL/PLATELET
Basophils Absolute: 0 10*3/uL (ref 0.0–0.1)
Basophils Relative: 0.3 % (ref 0.0–3.0)
Eosinophils Absolute: 0.1 10*3/uL (ref 0.0–0.7)
Eosinophils Relative: 1.1 % (ref 0.0–5.0)
HCT: 45.9 % (ref 39.0–52.0)
Hemoglobin: 15.4 g/dL (ref 13.0–17.0)
Lymphocytes Relative: 75.9 % — ABNORMAL HIGH (ref 12.0–46.0)
Lymphs Abs: 10 10*3/uL — ABNORMAL HIGH (ref 0.7–4.0)
MCHC: 33.6 g/dL (ref 30.0–36.0)
MCV: 92.8 fl (ref 78.0–100.0)
Monocytes Absolute: 0.8 10*3/uL (ref 0.1–1.0)
Monocytes Relative: 6.4 % (ref 3.0–12.0)
Neutro Abs: 2.1 10*3/uL (ref 1.4–7.7)
Neutrophils Relative %: 16.3 % — ABNORMAL LOW (ref 43.0–77.0)
Platelets: 195 10*3/uL (ref 150.0–400.0)
RBC: 4.95 Mil/uL (ref 4.22–5.81)
RDW: 13.5 % (ref 11.5–15.5)
WBC: 13.1 10*3/uL — ABNORMAL HIGH (ref 4.0–10.5)

## 2014-04-11 LAB — BASIC METABOLIC PANEL
BUN: 22 mg/dL (ref 6–23)
CO2: 26 mEq/L (ref 19–32)
Calcium: 9.4 mg/dL (ref 8.4–10.5)
Chloride: 105 mEq/L (ref 96–112)
Creatinine, Ser: 1.2 mg/dL (ref 0.4–1.5)
GFR: 75.94 mL/min (ref >60.00)
Glucose, Bld: 91 mg/dL (ref 70–99)
Potassium: 4.2 mEq/L (ref 3.5–5.1)
Sodium: 139 mEq/L (ref 135–145)

## 2014-04-11 LAB — LIPID PANEL
Cholesterol: 268 mg/dL — ABNORMAL HIGH (ref 0–200)
HDL: 52 mg/dL (ref >39.00)
LDL Cholesterol: 193 mg/dL — ABNORMAL HIGH (ref 0–99)
Total CHOL/HDL Ratio: 5
Triglycerides: 115 mg/dL (ref 0.0–149.0)
VLDL: 23 mg/dL (ref 0.0–40.0)

## 2014-04-11 LAB — HEPATIC FUNCTION PANEL
ALT: 36 U/L (ref 0–53)
AST: 32 U/L (ref 0–37)
Albumin: 4 g/dL (ref 3.5–5.2)
Alkaline Phosphatase: 62 U/L (ref 39–117)
Bilirubin, Direct: 0.1 mg/dL (ref 0.0–0.3)
Total Bilirubin: 0.8 mg/dL (ref 0.2–1.2)
Total Protein: 7 g/dL (ref 6.0–8.3)

## 2014-04-11 LAB — TSH: TSH: 1.93 u[IU]/mL (ref 0.35–4.50)

## 2014-04-11 MED ORDER — IOHEXOL 300 MG/ML  SOLN
1.0000 mL | Freq: Once | INTRAMUSCULAR | Status: AC | PRN
  Administered 2014-04-11: 1 mL via EPIDURAL

## 2014-04-11 MED ORDER — TRIAMCINOLONE ACETONIDE 40 MG/ML IJ SUSP (RADIOLOGY)
60.0000 mg | Freq: Once | INTRAMUSCULAR | Status: AC
Start: 2014-04-11 — End: 2014-04-11
  Administered 2014-04-11: 60 mg via EPIDURAL

## 2014-04-11 NOTE — Assessment & Plan Note (Signed)
Tolerating meds.

## 2014-04-11 NOTE — Discharge Instructions (Signed)

## 2014-04-11 NOTE — Progress Notes (Signed)
Aortic regurgitation- s/p replacement [facility]  Hx of PE-no recurrence- off rivaroxaban (unless he travels)  afib- no recurrence  Anxiety- doing well on meds  Past Medical History  Diagnosis Date  . Hypertension   . Hyperlipidemia   . Atrial fibrillation   . Mitral regurgitation   . History of pulmonary embolism 2011  . Aortic regurgitation     AVR  . History of prostate cancer   . Anticoagulants causing adverse effect in therapeutic use     History   Social History  . Marital Status: Single    Spouse Name: N/A    Number of Children: N/A  . Years of Education: N/A   Occupational History  . Not on file.   Social History Main Topics  . Smoking status: Former Smoker -- 1.00 packs/day    Types: Cigarettes    Quit date: 01/29/1981  . Smokeless tobacco: Never Used  . Alcohol Use: Yes     Comment: occasional  . Drug Use: No  . Sexual Activity: Not on file   Other Topics Concern  . Not on file   Social History Narrative  . No narrative on file    Past Surgical History  Procedure Laterality Date  . Cardiac catheterization    . Penile prosthesis implant    . Prostate surgery    . Cataract extraction      L 12/06/09   R 09/14/09  . Aortic valve replacement  2011    [facility]     Family History  Problem Relation Age of Onset  . Hyperlipidemia Other   . Hypertension Other   . Cancer      prostate  . Cancer Sister     uterine  . Colon cancer Sister 51    Allergies  Allergen Reactions  . Hydrocodone-Acetaminophen Shortness Of Breath  . Oxycodone Hcl Shortness Of Breath  . Aspirin     Can't take high dose  . Atorvastatin     REACTION: myalgia  . Buspirone Hcl     REACTION: headache  . Codeine Phosphate     REACTION: upset stomach, itching  . Morphine Sulfate     REACTION: itching  . Rabeprazole     REACTION: headache  . Ramipril     REACTION: unspecified  . Telmisartan-Hctz     REACTION: sob,dizzy,palpitations    Current Outpatient Prescriptions on  File Prior to Visit  Medication Sig Dispense Refill  . aspirin 81 MG tablet Take 81 mg by mouth daily.        Marland Kitchen imipramine (TOFRANIL) 10 MG tablet TAKE 1 TABLET BY MOUTH AT BEDTIME AS NEEDED  90 tablet  0  . LORazepam (ATIVAN) 0.5 MG tablet TAKE 1/2 TO 1 TABLET BY MOUTH EVERY DAY AS NEEDED  30 tablet  3  . meloxicam (MOBIC) 15 MG tablet Take 15 mg by mouth daily.       . methocarbamol (ROBAXIN) 500 MG tablet Take 500 mg by mouth 3 (three) times daily.       . metoprolol succinate (TOPROL-XL) 25 MG 24 hr tablet Take 1 tablet (25 mg total) by mouth daily.  30 tablet  11  . niacin 500 MG tablet Take 500 mg by mouth daily with breakfast.      . Omega-3 Fatty Acids (FISH OIL) 1000 MG CAPS Take by mouth daily.        . Rivaroxaban (XARELTO) 20 MG TABS tablet Take 1 tablet (20 mg total) by mouth as needed (takes only  when flying).  30 tablet  0  . venlafaxine XR (EFFEXOR-XR) 37.5 MG 24 hr capsule Take 1 capsule (37.5 mg total) by mouth daily.  90 capsule  3   No current facility-administered medications on file prior to visit.     patient denies chest pain, shortness of breath, orthopnea. Denies lower extremity edema, abdominal pain, change in appetite, change in bowel movements. Patient denies rashes, musculoskeletal complaints. No other specific complaints in a complete review of systems.   BP 142/92  Pulse 84  Temp(Src) 98.7 F (37.1 C) (Oral)  Ht 5\' 10"  (1.778 m)  Wt 204 lb (92.534 kg)  BMI 29.27 kg/m2   well-developed well-nourished male in no acute distress. HEENT exam atraumatic, normocephalic, neck supple without jugular venous distention. Chest clear to auscultation cardiac exam S1-S2 are regular. Abdominal exam overweight with bowel sounds, soft and nontender. Extremities no edema. Neurologic exam is alert with a normal gait.   HYPERTENSION Tolerating meds  AORTIC REGURGITATION, MODERATE Doing well i'll remove from problem list because this has been corrected with  surgery  ATRIAL FIBRILLATION No recurrence and has not required medciations No need for daily xarelto  PULMONARY EMBOLISM No recurrence  Off of anticoagulation unless he is travelling

## 2014-04-11 NOTE — Progress Notes (Signed)
Pre visit review using our clinic review tool, if applicable. No additional management support is needed unless otherwise documented below in the visit note. 

## 2014-04-13 ENCOUNTER — Encounter: Payer: Self-pay | Admitting: Internal Medicine

## 2014-04-13 NOTE — Assessment & Plan Note (Signed)
No recurrence and has not required medciations No need for daily xarelto

## 2014-04-13 NOTE — Assessment & Plan Note (Signed)
No recurrence  Off of anticoagulation unless he is travelling

## 2014-04-13 NOTE — Assessment & Plan Note (Signed)
Doing well i'll remove from problem list because this has been corrected with surgery

## 2014-04-14 ENCOUNTER — Ambulatory Visit (INDEPENDENT_AMBULATORY_CARE_PROVIDER_SITE_OTHER): Payer: 59 | Admitting: Psychology

## 2014-04-14 DIAGNOSIS — F4321 Adjustment disorder with depressed mood: Secondary | ICD-10-CM

## 2014-04-18 ENCOUNTER — Encounter: Payer: Self-pay | Admitting: Internal Medicine

## 2014-04-19 ENCOUNTER — Other Ambulatory Visit: Payer: Self-pay | Admitting: *Deleted

## 2014-04-19 MED ORDER — ROSUVASTATIN CALCIUM 10 MG PO TABS: 5.0000 mg | ORAL_TABLET | ORAL | Status: DC

## 2014-04-28 ENCOUNTER — Ambulatory Visit (INDEPENDENT_AMBULATORY_CARE_PROVIDER_SITE_OTHER): Payer: Medicare Other | Admitting: Psychology

## 2014-04-28 DIAGNOSIS — F4321 Adjustment disorder with depressed mood: Secondary | ICD-10-CM

## 2014-05-10 ENCOUNTER — Ambulatory Visit (INDEPENDENT_AMBULATORY_CARE_PROVIDER_SITE_OTHER): Payer: Medicare Other | Admitting: Family Medicine

## 2014-05-10 ENCOUNTER — Encounter: Payer: Self-pay | Admitting: Family Medicine

## 2014-05-10 VITALS — BP 127/85 | HR 85 | Temp 97.6°F | Resp 18 | Ht 70.0 in | Wt 201.0 lb

## 2014-05-10 DIAGNOSIS — C9590 Leukemia, unspecified not having achieved remission: Secondary | ICD-10-CM

## 2014-05-10 DIAGNOSIS — E785 Hyperlipidemia, unspecified: Secondary | ICD-10-CM

## 2014-05-10 NOTE — Progress Notes (Signed)
Pre visit review using our clinic review tool, if applicable. No additional management support is needed unless otherwise documented below in the visit note. 

## 2014-05-10 NOTE — Progress Notes (Signed)
OFFICE NOTE  05/10/2014  CC:  Chief Complaint  Patient presents with  . Results    labs drawn on 04/11/14   HPI: Patient is a 68 y.o. African-American male who is here for review of labs done 04/11/14, also to discuss recent vertigo.  Labs showed mildly increased WBC compared to 1 yr ago: pt has chronic lymphocytic leukemia. Lab Results  Component Value Date   WBC 13.1* 04/11/2014   HGB 15.4 04/11/2014   HCT 45.9 04/11/2014   MCV 92.8 04/11/2014   PLT 195.0 04/11/2014   Also, his cholesterol panel showed LDL >90.  He was put on crestor 10mg , 1/2 tab qod.  He started this and notes no signif side effects. He has questions today about the significance of his WBC count and wants to go over the plan again (repeat labs in 56mo).  Dr. Leanne Chang is retiring from clinical practice so pt will be transferring care to me.  He reports hx of migraine HA syndrome. He says for about the last week he has had mostly morning time vertiginous sensation, unclear what triggers it but sounds like change of head/upper body position.  No ringing in ears and no hearing deficit. Sx's last a minute or two with intensity, then vague sensation of similar sx's for about 1/2 hour--he lies down with eyes closed and feels better.  No staggering gait, no presyncope. Says he has had similar episodes in the remote past but they seemed to be milder.  Pertinent PMH:  Past Medical History  Diagnosis Date  . Hypertension   . Hyperlipidemia   . Atrial fibrillation   . Mitral regurgitation   . History of pulmonary embolism 2011  . Aortic regurgitation 04/2007 surgery    Resolved with [facility]R at [facility]  . History of prostate cancer   . Anticoagulants causing adverse effect in therapeutic use    Past Surgical History  Procedure Laterality Date  . Cardiac catheterization    . Penile prosthesis implant    . Prostate surgery    . Cataract extraction      L 12/06/09   R 09/14/09  . Aortic valve replacement  2011    [facility]     MEDS:   Outpatient Prescriptions Prior to Visit  Medication Sig Dispense Refill  . aspirin 81 MG tablet Take 81 mg by mouth daily.        Marland Kitchen imipramine (TOFRANIL) 10 MG tablet TAKE 1 TABLET BY MOUTH AT BEDTIME AS NEEDED  90 tablet  0  . LORazepam (ATIVAN) 0.5 MG tablet TAKE 1/2 TO 1 TABLET BY MOUTH EVERY DAY AS NEEDED  30 tablet  3  . meloxicam (MOBIC) 15 MG tablet Take 15 mg by mouth daily.       . methocarbamol (ROBAXIN) 500 MG tablet Take 500 mg by mouth 3 (three) times daily.       . metoprolol succinate (TOPROL-XL) 25 MG 24 hr tablet Take 1 tablet (25 mg total) by mouth daily.  30 tablet  11  . niacin 500 MG tablet Take 500 mg by mouth daily with breakfast.      . Omega-3 Fatty Acids (FISH OIL) 1000 MG CAPS Take by mouth daily.        . Rivaroxaban (XARELTO) 20 MG TABS tablet Take 1 tablet (20 mg total) by mouth as needed (takes only when flying).  30 tablet  0  . rosuvastatin (CRESTOR) 10 MG tablet Take 0.5 tablets (5 mg total) by mouth every other day.  30 tablet  3  . venlafaxine XR (EFFEXOR-XR) 37.5 MG 24 hr capsule Take 1 capsule (37.5 mg total) by mouth daily.  90 capsule  3   No facility-administered medications prior to visit.    PE: Blood pressure 127/85, pulse 85, temperature 97.6 F (36.4 C), temperature source Temporal, resp. rate 18, height 5\' 10"  (1.778 m), weight 201 lb (91.173 kg), SpO2 97.00%. Gen: Alert, well appearing.  Patient is oriented to person, place, time, and situation. AFFECT: pleasant, lucid thought and speech. Vision: normal visual field testing Dix halpike maneuvers: with head turned to right he felt nausea and mild vertigo but had no nystagmus. Neg on left.  IMPRESSION AND PLAN:  1) BPPV: discussed home epley maneuvers, gave pt handout and reviewed this with him in detail.  2) CLL: WBC a bit up compared to 1 yr ago.  I think it is ok to repeat in 18mo and if rising then get patient back into his former oncologist for follow up.  He expressed understanding  and is comfortable with this approach.  3) Hyperlipidemia: pt ok to take crestor 10mg , 1/2 tab qod, repeat lipids in 46mo.  An After Visit Summary was printed and given to the patient.  FOLLOW UP: 89mo for labs, o/v the following week

## 2014-05-12 ENCOUNTER — Ambulatory Visit (INDEPENDENT_AMBULATORY_CARE_PROVIDER_SITE_OTHER): Payer: 59 | Admitting: Psychology

## 2014-05-12 DIAGNOSIS — F4321 Adjustment disorder with depressed mood: Secondary | ICD-10-CM

## 2014-05-18 ENCOUNTER — Other Ambulatory Visit: Payer: Self-pay | Admitting: Internal Medicine

## 2014-05-23 ENCOUNTER — Other Ambulatory Visit: Payer: Self-pay | Admitting: Internal Medicine

## 2014-05-23 NOTE — Telephone Encounter (Signed)
Rx printed

## 2014-05-29 ENCOUNTER — Other Ambulatory Visit: Payer: Self-pay | Admitting: Internal Medicine

## 2014-06-03 ENCOUNTER — Telehealth: Payer: Self-pay | Admitting: *Deleted

## 2014-06-03 NOTE — Telephone Encounter (Signed)
Refill request for imipramine Last filled by MD on- 03/03/2014 #90 x0-By Dr. Leanne Chang. Last Appt-05/10/2014 Next Appt-11/16/2014 Please advise refill?

## 2014-06-06 NOTE — Telephone Encounter (Signed)
Done

## 2014-06-08 ENCOUNTER — Other Ambulatory Visit: Payer: Self-pay

## 2014-06-08 MED ORDER — METOPROLOL SUCCINATE ER 25 MG PO TB24: 25.0000 mg | ORAL_TABLET | Freq: Every day | ORAL | Status: DC

## 2014-06-14 ENCOUNTER — Ambulatory Visit (INDEPENDENT_AMBULATORY_CARE_PROVIDER_SITE_OTHER): Payer: Medicare Other | Admitting: Cardiovascular Disease

## 2014-06-14 ENCOUNTER — Encounter: Payer: Self-pay | Admitting: Cardiovascular Disease

## 2014-06-14 VITALS — BP 130/88 | HR 80 | Ht 70.0 in | Wt 200.4 lb

## 2014-06-14 DIAGNOSIS — I2699 Other pulmonary embolism without acute cor pulmonale: Secondary | ICD-10-CM

## 2014-06-14 DIAGNOSIS — I10 Essential (primary) hypertension: Secondary | ICD-10-CM

## 2014-06-14 DIAGNOSIS — Z954 Presence of other heart-valve replacement: Secondary | ICD-10-CM

## 2014-06-14 DIAGNOSIS — Z952 Presence of prosthetic heart valve: Secondary | ICD-10-CM

## 2014-06-14 DIAGNOSIS — E785 Hyperlipidemia, unspecified: Secondary | ICD-10-CM

## 2014-06-14 NOTE — Assessment & Plan Note (Signed)
Has xarelto to take for his long trips to prevent recurrence

## 2014-06-14 NOTE — Assessment & Plan Note (Signed)
2011  [facility] 76mm tissue valve  Elevated gradients suggesting premature failure  F/U echo 11/15  Asymptomatic with normal LV size and function

## 2014-06-14 NOTE — Progress Notes (Signed)
Patient ID: [name], male   DOB: [birth year], 68 y.o.   MRN: [id number] [name] is seen for F/U of anticoagulation post AVR, history of PAF. DVT with large bilateral PE 9/11. F/U CT and LE [name] with resolving thrombus burden 09/18/10. Fairly long period of subRx anticoagulation on 2 separate courses of Lovenox. INR now Rx. Still some dyspnea and fatigue. No SSCP, palpitations, edema. No bleeding problems. Duplex done 01/18/11 showed Some partial nonoccluding clot in SFA and proximal popliteal. He really wants to come off coumadin. He will finish a year of anticoagulation at the end of this month and has good resolution of thrombus burden by imaging Recent Dx of low grade CLL. Sees [name]. Taking prophylactic Xarelto for flights.   Will be traveling to [name] in October Has xarelto to take starting 2 days before traveling and 2 days before return trip  Ech o1/15    Study Conclusions  - Left ventricle: The cavity size was normal. Wall thickness was increased in a pattern of moderate LVH. Systolic function was normal. The estimated ejection fraction was in the range of 55% to 60%. Wall motion was normal; there were no regional wall motion abnormalities. Doppler parameters are consistent with abnormal left ventricular relaxation (grade 1 diastolic dysfunction). - Aortic valve: A bioprosthesis was present. There was moderate stenosis. Trivial regurgitation. - Mitral valve: Mild regurgitation. - Atrial septum: There was an atrial septal aneurysm. Impressions:  - Oscillating density associated with MV most likely redundant chordae. Prosthetic aortic valve with mean gradient of 30 mmHg (higher than expected for bioprosthetic aortic valve; increased compared to 07/17/10).  25 mm tissue valve done at [name] by [name] in 2011  Discussed concern for premature failure of tissue valve  He indicates that [name] told him valve would last "18-19 " years I told him this was not true and 68 years was  more accurate in his age group.  He still would not have wanted a mechanical valve   ROS: Denies fever, malais, weight loss, blurry vision, decreased visual acuity, cough, sputum, SOB, hemoptysis, pleuritic pain, palpitaitons, heartburn, abdominal pain, melena, lower extremity edema, claudication, or rash.  All other systems reviewed and negative  General: Affect appropriate Healthy:  appears stated age 68: normal Neck supple with no adenopathy JVP normal no bruits no thyromegaly Lungs clear with no wheezing and good diaphragmatic motion Heart:  S1/S2 SEM  murmur, no rub, gallop or click PMI normal Abdomen: benighn, BS positve, no tenderness, no AAA no bruit.  No HSM or HJR Distal pulses intact with no bruits No edema Neuro non-focal Skin warm and dry No muscular weakness   Current Outpatient Prescriptions  Medication Sig Dispense Refill  . aspirin 81 MG tablet Take 81 mg by mouth daily.        Marland Kitchen imipramine (TOFRANIL) 10 MG tablet TAKE 1 TABLET BY MOUTH AT BEDTIME AS NEEDED  90 tablet  3  . LORazepam (ATIVAN) 0.5 MG tablet TAKE 1/2-1 TABLET BY MOUTH EVERY DAY AS NEEDED  30 tablet  5  . meloxicam (MOBIC) 15 MG tablet Take 15 mg by mouth daily.       . metoprolol succinate (TOPROL-XL) 25 MG 24 hr tablet Take 1 tablet (25 mg total) by mouth daily.  30 tablet  6  . niacin 500 MG tablet Take 500 mg by mouth daily with breakfast.      . Omega-3 Fatty Acids (FISH OIL) 1000 MG CAPS Take by mouth daily.        Marland Kitchen  Rivaroxaban (XARELTO) 20 MG TABS tablet Take 1 tablet (20 mg total) by mouth as needed (takes only when flying).  30 tablet  0  . venlafaxine XR (EFFEXOR-XR) 37.5 MG 24 hr capsule Take 1 capsule (37.5 mg total) by mouth daily.  90 capsule  3   No current facility-administered medications for this visit.    Allergies  Hydrocodone-acetaminophen; Oxycodone hcl; Telmisartan-hctz; Aspirin; Ramipril; Atorvastatin; Buspirone hcl; Codeine phosphate; Morphine sulfate; and  Rabeprazole  Electrocardiogram:  SR RBBB LAD   Assessment and Plan

## 2014-06-14 NOTE — Patient Instructions (Addendum)
Your physician recommends that you schedule a follow-up appointment in: November WITH  DR [name]   [facility] [place]  Your physician recommends that you continue on your current medications as directed. Please refer to the Current Medication list given to you today. Your physician has requested that you have an echocardiogram. Echocardiography is a painless test that uses sound waves to create images of your heart. It provides your doctor with information about the size and shape of your heart and how well your heart's chambers and valves are working. This procedure takes approximately one hour. There are no restrictions for this procedure. SEE DR [name]  SAME  DAY

## 2014-06-14 NOTE — Assessment & Plan Note (Signed)
Well controlled.  Continue current medications and low sodium Dash type diet.    

## 2014-06-14 NOTE — Assessment & Plan Note (Addendum)
Cholesterol is not at goal.    Intolerant to statin Will discuss PCSK9 drugs next visit Lab Results  Component Value Date   LDLCALC 193* 04/11/2014

## 2014-06-23 ENCOUNTER — Ambulatory Visit (INDEPENDENT_AMBULATORY_CARE_PROVIDER_SITE_OTHER): Payer: 59 | Admitting: Psychology

## 2014-06-23 DIAGNOSIS — F4321 Adjustment disorder with depressed mood: Secondary | ICD-10-CM

## 2014-06-30 ENCOUNTER — Other Ambulatory Visit: Payer: Self-pay | Admitting: Physical Medicine and Rehabilitation

## 2014-06-30 DIAGNOSIS — M542 Cervicalgia: Secondary | ICD-10-CM

## 2014-06-30 DIAGNOSIS — M79601 Pain in right arm: Secondary | ICD-10-CM

## 2014-06-30 DIAGNOSIS — M79602 Pain in left arm: Secondary | ICD-10-CM

## 2014-07-07 ENCOUNTER — Ambulatory Visit (INDEPENDENT_AMBULATORY_CARE_PROVIDER_SITE_OTHER): Payer: 59 | Admitting: Psychology

## 2014-07-07 DIAGNOSIS — F4321 Adjustment disorder with depressed mood: Secondary | ICD-10-CM

## 2014-07-12 ENCOUNTER — Ambulatory Visit
Admission: RE | Admit: 2014-07-12 | Discharge: 2014-07-12 | Disposition: A | Discharge status: Home / Self Care | Payer: Medicare Other | Source: Ambulatory Visit | Attending: Physical Medicine and Rehabilitation | Admitting: Physical Medicine and Rehabilitation

## 2014-07-12 DIAGNOSIS — M79601 Pain in right arm: Secondary | ICD-10-CM

## 2014-07-12 DIAGNOSIS — M79602 Pain in left arm: Secondary | ICD-10-CM

## 2014-07-12 DIAGNOSIS — M542 Cervicalgia: Secondary | ICD-10-CM

## 2014-07-12 MED ORDER — IOHEXOL 300 MG/ML  SOLN
1.0000 mL | Freq: Once | INTRAMUSCULAR | Status: AC | PRN
  Administered 2014-07-12: 1 mL via EPIDURAL

## 2014-07-12 MED ORDER — TRIAMCINOLONE ACETONIDE 40 MG/ML IJ SUSP (RADIOLOGY)
60.0000 mg | Freq: Once | INTRAMUSCULAR | Status: AC
  Administered 2014-07-12: 60 mg via EPIDURAL

## 2014-07-21 ENCOUNTER — Ambulatory Visit (INDEPENDENT_AMBULATORY_CARE_PROVIDER_SITE_OTHER): Payer: 59 | Admitting: Psychology

## 2014-07-21 DIAGNOSIS — F4321 Adjustment disorder with depressed mood: Secondary | ICD-10-CM

## 2014-07-27 ENCOUNTER — Other Ambulatory Visit: Payer: Self-pay | Admitting: Internal Medicine

## 2014-08-01 ENCOUNTER — Telehealth: Payer: Self-pay | Admitting: Cardiovascular Disease

## 2014-08-01 DIAGNOSIS — M7989 Other specified soft tissue disorders: Secondary | ICD-10-CM

## 2014-08-01 DIAGNOSIS — R52 Pain, unspecified: Secondary | ICD-10-CM

## 2014-08-01 NOTE — Telephone Encounter (Signed)
PT  CALLING   WANTING  TO MAKE  SURE  THAT  HAVING  VARICOSE  VEINS   IN LEFT  ARM  IS NOT   RELATED  TO HEART    WILL FORWARD  TO   DR [name]  FOR  REVIEW./CY

## 2014-08-01 NOTE — Telephone Encounter (Signed)
New problem   Pt need to speak to you concerning varicose veins left arm that is very sore. Please call pt.

## 2014-08-01 NOTE — Telephone Encounter (Signed)
They are not related to heart  Can order upper extremity venous duplex to make sure no clots since he has had DVT's before

## 2014-08-02 ENCOUNTER — Ambulatory Visit (HOSPITAL_COMMUNITY): Payer: Medicare Other | Attending: Internal Medicine | Admitting: Cardiology

## 2014-08-02 DIAGNOSIS — M7989 Other specified soft tissue disorders: Secondary | ICD-10-CM | POA: Diagnosis not present

## 2014-08-02 DIAGNOSIS — R52 Pain, unspecified: Secondary | ICD-10-CM | POA: Insufficient documentation

## 2014-08-02 DIAGNOSIS — I808 Phlebitis and thrombophlebitis of other sites: Secondary | ICD-10-CM | POA: Insufficient documentation

## 2014-08-02 DIAGNOSIS — M79609 Pain in unspecified limb: Secondary | ICD-10-CM | POA: Diagnosis not present

## 2014-08-02 DIAGNOSIS — M79602 Pain in left arm: Secondary | ICD-10-CM

## 2014-08-02 NOTE — Telephone Encounter (Signed)
PT   AWARE   UPPER   VENOUS DOP  SCHEDULED  FOR  TODAY  AT   3:30  PM .Adonis Housekeeper

## 2014-08-02 NOTE — Progress Notes (Addendum)
Upper extremity venous duplex left performed  Result given to Dr.Nishan, who suggested 3 month follow up.

## 2014-08-02 NOTE — Telephone Encounter (Signed)
LMTCB ./CY 

## 2014-08-04 ENCOUNTER — Ambulatory Visit (INDEPENDENT_AMBULATORY_CARE_PROVIDER_SITE_OTHER): Payer: 59 | Admitting: Psychology

## 2014-08-04 DIAGNOSIS — F4321 Adjustment disorder with depressed mood: Secondary | ICD-10-CM

## 2014-08-06 ENCOUNTER — Encounter: Payer: Self-pay | Admitting: Gastroenterology

## 2014-09-01 ENCOUNTER — Ambulatory Visit (INDEPENDENT_AMBULATORY_CARE_PROVIDER_SITE_OTHER): Payer: 59 | Admitting: Psychology

## 2014-09-01 DIAGNOSIS — F4321 Adjustment disorder with depressed mood: Secondary | ICD-10-CM

## 2014-09-03 ENCOUNTER — Telehealth: Payer: Self-pay

## 2014-09-03 NOTE — Telephone Encounter (Signed)
LVM for pt to call back.    RE: scheduling AWV for 2015 with NP or PA if pt allows.  

## 2014-09-05 ENCOUNTER — Ambulatory Visit (INDEPENDENT_AMBULATORY_CARE_PROVIDER_SITE_OTHER): Payer: 59 | Admitting: Family Medicine

## 2014-09-05 ENCOUNTER — Encounter: Payer: Self-pay | Admitting: Family Medicine

## 2014-09-05 VITALS — BP 136/92 | HR 81 | Temp 97.4°F | Ht 70.0 in | Wt 197.0 lb

## 2014-09-05 DIAGNOSIS — Z23 Encounter for immunization: Secondary | ICD-10-CM

## 2014-09-05 DIAGNOSIS — F411 Generalized anxiety disorder: Secondary | ICD-10-CM

## 2014-09-05 MED ORDER — PAROXETINE HCL 10 MG PO TABS: 10.0000 mg | ORAL_TABLET | Freq: Every day | ORAL | Status: DC

## 2014-09-05 NOTE — Progress Notes (Signed)
OFFICE NOTE  09/05/2014  CC: Questions  HPI: Patient is a 68 y.o. African-American male who is here for "questions about meds". Has dealt with chronic anxiety, has always declined meds, feels like it is getting worse lately with some of his medical problems getting worse.  Sees Dr. Cheryln Manly for counseling. Has some curious physical symptoms: sometimes while driving he says his focus is abnormal.  He is not sure if it is from anxiety or some actual physical problem.   Other physical sx's when anxious: trouble breathing, lightheaded. He says he is very sensitive to medications, says 75 mg Effexor XR caused nightmares that were intolerable. Dose was cut back to 37.5mg  qd and this went away, but he recalls his anxiety not being as bad when on the 75mg  qd.   Pertinent PMH:  Past medical, surgical, social, and family history reviewed and no changes are noted since last office visit.  MEDS:  Outpatient Prescriptions Prior to Visit  Medication Sig Dispense Refill  . aspirin 81 MG tablet Take 81 mg by mouth daily.        . fluticasone (FLONASE) 50 MCG/ACT nasal spray       . imipramine (TOFRANIL) 10 MG tablet TAKE 1 TABLET BY MOUTH AT BEDTIME AS NEEDED  90 tablet  3  . LORazepam (ATIVAN) 0.5 MG tablet TAKE 1/2-1 TABLET BY MOUTH EVERY DAY AS NEEDED  30 tablet  5  . meloxicam (MOBIC) 15 MG tablet Take 15 mg by mouth daily.       . metoprolol succinate (TOPROL-XL) 25 MG 24 hr tablet Take 1 tablet (25 mg total) by mouth daily.  30 tablet  6  . Omega-3 Fatty Acids (FISH OIL) 1000 MG CAPS Take by mouth daily.        . Rivaroxaban (XARELTO) 20 MG TABS tablet Take 1 tablet (20 mg total) by mouth as needed (takes only when flying).  30 tablet  0  . venlafaxine XR (EFFEXOR-XR) 37.5 MG 24 hr capsule TAKE 1 CAPSULE (37.5 MG TOTAL) BY MOUTH DAILY.  90 capsule  3  . CRESTOR 10 MG tablet       . niacin 500 MG tablet Take 500 mg by mouth daily with breakfast.       No facility-administered medications prior  to visit.    PE: Blood pressure 136/92, pulse 81, temperature 97.4 F (36.3 C), temperature source Temporal, height 5\' 10"  (1.778 m), weight 197 lb (89.359 kg), SpO2 95.00%. Gen: Alert, well appearing.  Patient is oriented to person, place, time, and situation. PPI:RJJO: no injection, icteris, swelling, or exudate.  EOMI, PERRLA. Mouth: lips without lesion/swelling.  Oral mucosa pink and moist. Oropharynx without erythema, exudate, or swelling.  CV: RRR, no m/r/g.   LUNGS: CTA bilat, nonlabored resps, good aeration in all lung fields.  IMPRESSION AND PLAN:  1) GAD.  Will ween off of effexor XR (take 1 tab qod x 7 doses, then stop. Start paroxetine 10mg  qd, increase to 20mg  qd after 1 mo. Therapeutic expectations and side effect profile of medication discussed today.  Patient's questions answered.  Flu vaccine IM today.  Spent 25 min with pt today, with >50% of this time spent in counseling and care coordination regarding the above problems.  An After Visit Summary was printed and given to the patient.  FOLLOW UP: 1 mo

## 2014-09-05 NOTE — Patient Instructions (Signed)
Take 1 venlafaxine (Effexor XR) 37.5 tab every other day for 7 doses, then stop.

## 2014-09-05 NOTE — Progress Notes (Signed)
Pre visit review using our clinic review tool, if applicable. No additional management support is needed unless otherwise documented below in the visit note. 

## 2014-09-12 ENCOUNTER — Other Ambulatory Visit (HOSPITAL_COMMUNITY): Payer: Self-pay | Admitting: Cardiology

## 2014-09-12 DIAGNOSIS — I808 Phlebitis and thrombophlebitis of other sites: Secondary | ICD-10-CM

## 2014-09-15 ENCOUNTER — Ambulatory Visit (INDEPENDENT_AMBULATORY_CARE_PROVIDER_SITE_OTHER): Payer: 59 | Admitting: Psychology

## 2014-09-15 DIAGNOSIS — F4321 Adjustment disorder with depressed mood: Secondary | ICD-10-CM

## 2014-09-16 ENCOUNTER — Other Ambulatory Visit (HOSPITAL_COMMUNITY): Payer: Self-pay

## 2014-09-16 ENCOUNTER — Ambulatory Visit: Payer: Self-pay | Admitting: Cardiovascular Disease

## 2014-09-19 NOTE — Progress Notes (Signed)
Patient ID: [name], male   DOB: 13-Nov-1945, 68 y.o.   MRN: [id number] [name] is seen for F/U of anticoagulation post AVR, history of PAF. DVT with large bilateral PE 9/11. F/U CT and LE [name] with resolving thrombus burden 09/18/10. Fairly long period of subRx anticoagulation on 2 separate courses of Lovenox. INR now Rx. Still some dyspnea and fatigue. No SSCP, palpitations, edema. No bleeding problems. Duplex done 01/18/11 showed Some partial nonoccluding clot in SFA and proximal popliteal. He really wants to come off coumadin. He will finish a year of anticoagulation at the end of this month and has good resolution of thrombus burden by imaging Recent Dx of low grade CLL. Sees [employer]. Taking prophylactic Xarelto for flights.   10/15 had LUE superficial phlebitis  Left forearm feels fine  Reviewed US venous duplex and there is still some superficial thrombosis in the basilic system but nothing new and nothing proximal  Took some xarelto for his trip to [place] o1/15   Study Conclusions  - Left ventricle: The cavity size was normal. Wall thickness was increased in a pattern of moderate LVH. Systolic function was normal. The estimated ejection fraction was in the range of 55% to 60%. Wall motion was normal; there were no regional wall motion abnormalities. Doppler parameters are consistent with abnormal left ventricular relaxation (grade 1 diastolic dysfunction). - Aortic valve: A bioprosthesis was present. There was moderate stenosis. Trivial regurgitation. - Mitral valve: Mild regurgitation. - Atrial septum: There was an atrial septal aneurysm. Impressions:  - Oscillating density associated with MV most likely redundant chordae. Prosthetic aortic valve with mean gradient of 30 mmHg (higher than expected for bioprosthetic aortic valve; increased compared to 07/17/10).  25 mm tissue valve done at [facility] by Glower in 2011  Had echo today and I reviewed the study at length  Gradients are  actually lower with mean 16 and peak of 24mmHg  The tissue leaflets have some restricted motion but no calcium  EF 50-55%  Long discussion about recent evidence that coumadin may help deteriorating tissue valves.  He does not want to be on coumadin and this f/u echo is more reassuring than one last year.    Also discussed possibility of valve in valve TAVR when time comes rather than 2nd surgery which he was more excited to hear  Seen by Dr Anitra Lauth recently and Effexor weaned with paxil started for anxiety      ROS: Denies fever, malais, weight loss, blurry vision, decreased visual acuity, cough, sputum, SOB, hemoptysis, pleuritic pain, palpitaitons, heartburn, abdominal pain, melena, lower extremity edema, claudication, or rash.  All other systems reviewed and negative  General: Affect appropriate Healthy:  appears stated age 39: normal Neck supple with no adenopathy JVP normal no bruits no thyromegaly Lungs clear with no wheezing and good diaphragmatic motion Heart:  S1/S2 SEM  murmur, no rub, gallop or click PMI normal Abdomen: benighn, BS positve, no tenderness, no AAA no bruit.  No HSM or HJR Distal pulses intact with no bruits No edema Neuro non-focal Skin warm and dry No muscular weakness   Current Outpatient Prescriptions  Medication Sig Dispense Refill  . aspirin 81 MG tablet Take 81 mg by mouth daily.      . fluticasone (FLONASE) 50 MCG/ACT nasal spray     . imipramine (TOFRANIL) 10 MG tablet TAKE 1 TABLET BY MOUTH AT BEDTIME AS NEEDED 90 tablet 3  . LORazepam (ATIVAN) 0.5 MG tablet TAKE 1/2-1 TABLET  BY MOUTH EVERY DAY AS NEEDED 30 tablet 5  . meloxicam (MOBIC) 15 MG tablet Take 15 mg by mouth daily.     . metoprolol succinate (TOPROL-XL) 25 MG 24 hr tablet Take 1 tablet (25 mg total) by mouth daily. 30 tablet 6  . Omega-3 Fatty Acids (FISH OIL) 1000 MG CAPS Take by mouth daily.      Marland Kitchen PARoxetine (PAXIL) 10 MG tablet Take 1 tablet (10 mg total) by mouth daily. 30  tablet 1  . Rivaroxaban (XARELTO) 20 MG TABS tablet Take 1 tablet (20 mg total) by mouth as needed (takes only when flying). 30 tablet 0   No current facility-administered medications for this visit.    Allergies  Hydrocodone-acetaminophen; Oxycodone hcl; Telmisartan-hctz; Ramipril; Aspirin; Atorvastatin; Buspirone hcl; Codeine phosphate; Morphine sulfate; and Rabeprazole  Electrocardiogram:  1/13  SR rate 78  RBBB/LAFB  LVH  Assessment and Plan

## 2014-09-20 ENCOUNTER — Encounter: Payer: Self-pay | Admitting: Cardiovascular Disease

## 2014-09-20 ENCOUNTER — Ambulatory Visit (HOSPITAL_COMMUNITY): Payer: Medicare Other | Attending: Cardiology | Admitting: Cardiology

## 2014-09-20 ENCOUNTER — Ambulatory Visit (HOSPITAL_BASED_OUTPATIENT_CLINIC_OR_DEPARTMENT_OTHER): Payer: Medicare Other | Admitting: Cardiology

## 2014-09-20 ENCOUNTER — Ambulatory Visit (INDEPENDENT_AMBULATORY_CARE_PROVIDER_SITE_OTHER): Payer: Medicare Other | Admitting: Cardiovascular Disease

## 2014-09-20 VITALS — BP 150/80 | HR 55 | Ht 70.0 in | Wt 200.4 lb

## 2014-09-20 DIAGNOSIS — Z954 Presence of other heart-valve replacement: Secondary | ICD-10-CM | POA: Diagnosis not present

## 2014-09-20 DIAGNOSIS — I10 Essential (primary) hypertension: Secondary | ICD-10-CM

## 2014-09-20 DIAGNOSIS — E785 Hyperlipidemia, unspecified: Secondary | ICD-10-CM

## 2014-09-20 DIAGNOSIS — I809 Phlebitis and thrombophlebitis of unspecified site: Secondary | ICD-10-CM

## 2014-09-20 DIAGNOSIS — Z952 Presence of prosthetic heart valve: Secondary | ICD-10-CM

## 2014-09-20 DIAGNOSIS — I808 Phlebitis and thrombophlebitis of other sites: Secondary | ICD-10-CM

## 2014-09-20 DIAGNOSIS — F411 Generalized anxiety disorder: Secondary | ICD-10-CM

## 2014-09-20 NOTE — Patient Instructions (Signed)
Your physician wants you to follow-up in:  6 MONTHS WITH DR NISHAN  You will receive a reminder letter in the mail two months in advance. If you don't receive a letter, please call our office to schedule the follow-up appointment. Your physician recommends that you continue on your current medications as directed. Please refer to the Current Medication list given to you today. 

## 2014-09-20 NOTE — Assessment & Plan Note (Signed)
Reviewed f/u UE duplex and persistent thrombus in LUE basilic system  No new thrombus and no proximal extension  No need for anticoagulation at this time and patient indicates possible trauma as etiology before trip to [place] getting in / out of car  Consider f/u duplex in 3 months

## 2014-09-20 NOTE — Assessment & Plan Note (Signed)
Home BP readings reviewed and normal some white coat component continue current meds and low sodium diet

## 2014-09-20 NOTE — Assessment & Plan Note (Signed)
Chronic but having a hard time transitioning from effexor to paxil  Discussed with Dr Ernestine Conrad and agree non sympathomimetic drug would be better

## 2014-09-20 NOTE — Assessment & Plan Note (Signed)
Elevated Intolerant to statin in past  Will discuss PSK 9 next visit but I doubt he would want to inject himself   Lab Results  Component Value Date   LDLCALC 193* 04/11/2014

## 2014-09-20 NOTE — Assessment & Plan Note (Signed)
Reassuring gradients are lower today ? From lower cardiac output??  Or afterload.  BP not recorded during two studies.  Discussed in detail studies on coumadin for premature failing tissue valves and also possibility of valve in valve TAVR for failed valves in future

## 2014-09-20 NOTE — Progress Notes (Signed)
Left upper venous duplex performed  

## 2014-09-20 NOTE — Progress Notes (Signed)
Echo performed. 

## 2014-09-29 ENCOUNTER — Ambulatory Visit (INDEPENDENT_AMBULATORY_CARE_PROVIDER_SITE_OTHER): Payer: 59 | Admitting: Psychology

## 2014-09-29 DIAGNOSIS — F4321 Adjustment disorder with depressed mood: Secondary | ICD-10-CM

## 2014-10-13 ENCOUNTER — Ambulatory Visit (INDEPENDENT_AMBULATORY_CARE_PROVIDER_SITE_OTHER): Payer: 59 | Admitting: Psychology

## 2014-10-13 DIAGNOSIS — F4321 Adjustment disorder with depressed mood: Secondary | ICD-10-CM

## 2014-10-25 ENCOUNTER — Other Ambulatory Visit: Payer: Self-pay | Admitting: Family Medicine

## 2014-10-25 MED ORDER — PAROXETINE HCL 10 MG PO TABS: 10.0000 mg | ORAL_TABLET | Freq: Every day | ORAL | Status: DC

## 2014-10-27 ENCOUNTER — Ambulatory Visit (INDEPENDENT_AMBULATORY_CARE_PROVIDER_SITE_OTHER): Payer: 59 | Admitting: Psychology

## 2014-10-27 DIAGNOSIS — F4321 Adjustment disorder with depressed mood: Secondary | ICD-10-CM

## 2014-11-02 ENCOUNTER — Emergency Department (HOSPITAL_COMMUNITY): Payer: Medicare Other

## 2014-11-02 ENCOUNTER — Encounter (HOSPITAL_COMMUNITY): Payer: Self-pay

## 2014-11-02 ENCOUNTER — Inpatient Hospital Stay (HOSPITAL_COMMUNITY)
Admission: EM | Admit: 2014-11-02 | Discharge: 2014-11-03 | DRG: 176 | Disposition: A | Discharge status: Home / Self Care | Payer: Medicare Other | Attending: Internal Medicine | Admitting: Internal Medicine

## 2014-11-02 DIAGNOSIS — I2699 Other pulmonary embolism without acute cor pulmonale: Secondary | ICD-10-CM | POA: Diagnosis present

## 2014-11-02 DIAGNOSIS — K219 Gastro-esophageal reflux disease without esophagitis: Secondary | ICD-10-CM

## 2014-11-02 DIAGNOSIS — Z8249 Family history of ischemic heart disease and other diseases of the circulatory system: Secondary | ICD-10-CM | POA: Diagnosis not present

## 2014-11-02 DIAGNOSIS — G43909 Migraine, unspecified, not intractable, without status migrainosus: Secondary | ICD-10-CM | POA: Diagnosis present

## 2014-11-02 DIAGNOSIS — Z79899 Other long term (current) drug therapy: Secondary | ICD-10-CM

## 2014-11-02 DIAGNOSIS — E785 Hyperlipidemia, unspecified: Secondary | ICD-10-CM | POA: Diagnosis present

## 2014-11-02 DIAGNOSIS — R0781 Pleurodynia: Secondary | ICD-10-CM | POA: Diagnosis present

## 2014-11-02 DIAGNOSIS — C61 Malignant neoplasm of prostate: Secondary | ICD-10-CM | POA: Diagnosis present

## 2014-11-02 DIAGNOSIS — Z954 Presence of other heart-valve replacement: Secondary | ICD-10-CM

## 2014-11-02 DIAGNOSIS — R079 Chest pain, unspecified: Secondary | ICD-10-CM

## 2014-11-02 DIAGNOSIS — I480 Paroxysmal atrial fibrillation: Secondary | ICD-10-CM | POA: Diagnosis present

## 2014-11-02 DIAGNOSIS — Z86711 Personal history of pulmonary embolism: Secondary | ICD-10-CM

## 2014-11-02 DIAGNOSIS — Z888 Allergy status to other drugs, medicaments and biological substances status: Secondary | ICD-10-CM

## 2014-11-02 DIAGNOSIS — Z7982 Long term (current) use of aspirin: Secondary | ICD-10-CM | POA: Diagnosis not present

## 2014-11-02 DIAGNOSIS — I4891 Unspecified atrial fibrillation: Secondary | ICD-10-CM | POA: Diagnosis present

## 2014-11-02 DIAGNOSIS — Z886 Allergy status to analgesic agent status: Secondary | ICD-10-CM | POA: Diagnosis not present

## 2014-11-02 DIAGNOSIS — Z885 Allergy status to narcotic agent status: Secondary | ICD-10-CM | POA: Diagnosis not present

## 2014-11-02 DIAGNOSIS — I82409 Acute embolism and thrombosis of unspecified deep veins of unspecified lower extremity: Secondary | ICD-10-CM | POA: Diagnosis present

## 2014-11-02 DIAGNOSIS — Z7901 Long term (current) use of anticoagulants: Secondary | ICD-10-CM

## 2014-11-02 DIAGNOSIS — I10 Essential (primary) hypertension: Secondary | ICD-10-CM | POA: Diagnosis present

## 2014-11-02 DIAGNOSIS — R911 Solitary pulmonary nodule: Secondary | ICD-10-CM | POA: Diagnosis present

## 2014-11-02 DIAGNOSIS — Z87891 Personal history of nicotine dependence: Secondary | ICD-10-CM | POA: Diagnosis not present

## 2014-11-02 DIAGNOSIS — Z8546 Personal history of malignant neoplasm of prostate: Secondary | ICD-10-CM

## 2014-11-02 LAB — CBC
HCT: 44.8 % (ref 39.0–52.0)
Hemoglobin: 15 g/dL (ref 13.0–17.0)
MCH: 30.7 pg (ref 26.0–34.0)
MCHC: 33.5 g/dL (ref 30.0–36.0)
MCV: 91.8 fL (ref 78.0–100.0)
Platelets: 162 10*3/uL (ref 150–400)
RBC: 4.88 MIL/uL (ref 4.22–5.81)
RDW: 13.2 % (ref 11.5–15.5)
WBC: 11.3 10*3/uL — ABNORMAL HIGH (ref 4.0–10.5)

## 2014-11-02 LAB — BASIC METABOLIC PANEL
Anion gap: 7 (ref 5–15)
BUN: 16 mg/dL (ref 6–23)
CO2: 27 mmol/L (ref 19–32)
Calcium: 9.4 mg/dL (ref 8.4–10.5)
Chloride: 107 mEq/L (ref 96–112)
Creatinine, Ser: 1.34 mg/dL (ref 0.50–1.35)
GFR calc Af Amer: 61 mL/min — ABNORMAL LOW (ref >90)
GFR calc non Af Amer: 53 mL/min — ABNORMAL LOW (ref >90)
Glucose, Bld: 98 mg/dL (ref 70–99)
Potassium: 4.5 mmol/L (ref 3.5–5.1)
Sodium: 141 mmol/L (ref 135–145)

## 2014-11-02 LAB — I-STAT TROPONIN, ED: Troponin i, poc: 0 ng/mL (ref 0.00–0.08)

## 2014-11-02 LAB — ANTITHROMBIN III: AntiThromb III Func: 105 % (ref 75–120)

## 2014-11-02 LAB — HEPARIN LEVEL (UNFRACTIONATED): Heparin Unfractionated: 0.64 IU/mL (ref 0.30–0.70)

## 2014-11-02 MED ORDER — HEPARIN BOLUS VIA INFUSION
4000.0000 [IU] | Freq: Once | INTRAVENOUS | Status: AC
  Administered 2014-11-02: 4000 [IU] via INTRAVENOUS
  Filled 2014-11-02: qty 4000

## 2014-11-02 MED ORDER — HYDROCODONE-ACETAMINOPHEN 5-325 MG PO TABS: 1.0000 | ORAL_TABLET | ORAL | Status: DC | PRN

## 2014-11-02 MED ORDER — LORAZEPAM 0.5 MG PO TABS
0.5000 mg | ORAL_TABLET | Freq: Every day | ORAL | Status: DC | PRN
  Administered 2014-11-03: 0.5 mg via ORAL
  Filled 2014-11-02: qty 1

## 2014-11-02 MED ORDER — ONDANSETRON HCL 4 MG/2ML IJ SOLN: 4.0000 mg | Freq: Four times a day (QID) | INTRAMUSCULAR | Status: DC | PRN

## 2014-11-02 MED ORDER — FLUTICASONE PROPIONATE 50 MCG/ACT NA SUSP
2.0000 | Freq: Every day | NASAL | Status: DC | PRN
  Filled 2014-11-02: qty 16

## 2014-11-02 MED ORDER — ONDANSETRON HCL 4 MG PO TABS: 4.0000 mg | ORAL_TABLET | Freq: Four times a day (QID) | ORAL | Status: DC | PRN

## 2014-11-02 MED ORDER — PAROXETINE HCL 10 MG PO TABS
10.0000 mg | ORAL_TABLET | Freq: Every day | ORAL | Status: DC
  Administered 2014-11-03: 10 mg via ORAL
  Filled 2014-11-02: qty 1

## 2014-11-02 MED ORDER — GUAIFENESIN-DM 100-10 MG/5ML PO SYRP: 5.0000 mL | ORAL_SOLUTION | ORAL | Status: DC | PRN

## 2014-11-02 MED ORDER — HEPARIN (PORCINE) IN NACL 100-0.45 UNIT/ML-% IJ SOLN
1500.0000 [IU]/h | INTRAMUSCULAR | Status: AC
  Administered 2014-11-02 – 2014-11-03 (×2): 1500 [IU]/h via INTRAVENOUS
  Filled 2014-11-02 (×3): qty 250

## 2014-11-02 MED ORDER — IOHEXOL 350 MG/ML SOLN
100.0000 mL | Freq: Once | INTRAVENOUS | Status: AC | PRN
  Administered 2014-11-02: 70 mL via INTRAVENOUS

## 2014-11-02 MED ORDER — ASPIRIN 81 MG PO TABS: 81.0000 mg | ORAL_TABLET | Freq: Every day | ORAL | Status: DC

## 2014-11-02 MED ORDER — ASPIRIN 81 MG PO CHEW
81.0000 mg | CHEWABLE_TABLET | Freq: Every day | ORAL | Status: DC
  Administered 2014-11-03: 81 mg via ORAL
  Filled 2014-11-02: qty 1

## 2014-11-02 MED ORDER — IMIPRAMINE HCL 10 MG PO TABS
10.0000 mg | ORAL_TABLET | Freq: Every evening | ORAL | Status: DC | PRN
  Administered 2014-11-02: 10 mg via ORAL
  Filled 2014-11-02 (×3): qty 1

## 2014-11-02 MED ORDER — METOPROLOL SUCCINATE ER 25 MG PO TB24
25.0000 mg | ORAL_TABLET | Freq: Every day | ORAL | Status: DC
  Administered 2014-11-03: 25 mg via ORAL
  Filled 2014-11-02: qty 1

## 2014-11-02 MED ORDER — POLYETHYLENE GLYCOL 3350 17 G PO PACK
17.0000 g | PACK | Freq: Every day | ORAL | Status: DC | PRN
Start: 2014-11-02 — End: 2014-11-03
  Filled 2014-11-02: qty 1

## 2014-11-02 MED ORDER — METHOCARBAMOL 500 MG PO TABS
500.0000 mg | ORAL_TABLET | Freq: Every day | ORAL | Status: DC | PRN
Start: 2014-11-03 — End: 2014-11-03
  Filled 2014-11-02: qty 1

## 2014-11-02 MED ORDER — SODIUM CHLORIDE 0.9 % IJ SOLN: 3.0000 mL | Freq: Two times a day (BID) | INTRAMUSCULAR | Status: DC

## 2014-11-02 NOTE — ED Notes (Signed)
Pt from home with left-sided chest pain that started yesterday; was advised to come to ED for evaluation but refused transportation.  Sts he was at the dentist when the pain started; worse with inspiration; pain not reproducible.  Sts he took 1 robaxin at home yesterday with pain relief.  Took half a tablet today with minimal relief.

## 2014-11-02 NOTE — ED Notes (Signed)
Dr. Delo at bedside. 

## 2014-11-02 NOTE — Progress Notes (Signed)
ANTICOAGULATION CONSULT NOTE - Initial Consult  Pharmacy Consult for Heparin Indication: VTE treatment  Allergies  Allergen Reactions  . Hydrocodone-Acetaminophen Shortness Of Breath  . Oxycodone Hcl Shortness Of Breath  . Telmisartan-Hctz Shortness Of Breath and Palpitations    Dizziness, too  . Ramipril   . Aspirin Other (See Comments)    Can't take high dose  . Atorvastatin Other (See Comments)    myalgia  . Buspirone Hcl Other (See Comments)    headache  . Codeine Phosphate Itching and Nausea Only  . Morphine Sulfate Itching  . Rabeprazole Other (See Comments)    headache    Patient Measurements: Weight: 202 lb 1.6 oz (91.672 kg) Ideal Body Weight: 68.5 kg Heparin Dosing Weight: 87 kg  Vital Signs: Temp: 97.7 F (36.5 C) (12/23 1243) Temp Source: Oral (12/23 1243) BP: 163/96 mmHg (12/23 1517) Pulse Rate: 64 (12/23 1517)  Labs:  Recent Labs  11/02/14 1306  HGB 15.0  HCT 44.8  PLT 162  CREATININE 1.34    Estimated Creatinine Clearance: 60.1 mL/min (by C-G formula based on Cr of 1.34).   Medical History: Past Medical History  Diagnosis Date  . Hypertension   . Hyperlipidemia   . Atrial fibrillation   . Mitral regurgitation   . History of pulmonary embolism 2011  . Aortic regurgitation 04/2007 surgery    Resolved with AVR at [facility]  . History of prostate cancer   . Anticoagulants causing adverse effect in therapeutic use   . Migraine syndrome   . Cervical spondylosis     ESI by [name] in Ortho    Medications:   (Not in a hospital admission) Scheduled:  Infusions:   Assessment: 68yo male with history of Afib and prior PE presents with pleuritic left-sided chest pain x 1 day. Pharmacy is consulted to dose heparin for VTE treatment. CBC is normal.  Goal of Therapy:  Heparin level 0.3-0.7 units/ml Monitor platelets by anticoagulation protocol: Yes   Plan:  Give 4000 units bolus x 1 Start heparin infusion at 1500 units/hr Check anti-Xa  level in 6 hours and daily while on heparin Continue to monitor H&H and platelets  Monitor s/sx of bleeding  Andrey Cota. Diona Foley, PharmD Clinical Pharmacist Pager [phone] Sharilyn Sites M 11/02/2014,3:57 PM

## 2014-11-02 NOTE — H&P (Addendum)
Patient Demographics  [name], is a 68 y.o. male  MRN: [id number]   DOB - 07/26/1946  Admit Date - 11/02/2014  Outpatient Primary MD for the patient is [name], MD   With History of -  Past Medical History  Diagnosis Date  . Hypertension   . Hyperlipidemia   . Atrial fibrillation   . Mitral regurgitation   . History of pulmonary embolism 2011  . Aortic regurgitation 04/2007 surgery    Resolved with AVR at [facility]  . History of prostate cancer   . Anticoagulants causing adverse effect in therapeutic use   . Migraine syndrome   . Cervical spondylosis     ESI by [name] in Ortho      Past Surgical History  Procedure Laterality Date  . Cardiac catheterization    . Penile prosthesis implant    . Prostate surgery    . Cataract extraction      L 12/06/09   R 09/14/09  . Aortic valve replacement  2011    [place]     in for   Chief Complaint  Patient presents with  . Chest Pain     HPI  [name]  is a 68 y.o. male, history of PE few years ago, takes xaralto on as-needed basis when he travels or takes a long airplane flight, hypertension, dyslipidemia, paroxysmal atrial fibrillation, status post aortic valve replacement-tissue valve, who traveled to [place] in October and took xaralto  during his flight, he was feeling fine until about 24-hour was ago when he developed left-sided pleuritic chest pain, not associated with shortness of breath, no cough fever or chills, came to the ER where workup was suggestive of moderate burden bilateral PE on CT angiogram, a lung nodule, pulmonary critical care were called who recommended no further workup in the inpatient setting, hospitalist team was then called to admit.   Patient currently is symptom free except for pleuritic chest pain as above, in eyes  any shortness of breath, no fever or chills, no abdominal pain, no blood in stool or urine, no focal weakness, no swelling in lower extremities, no unintentional weight loss.    Review of Systems    In addition to the HPI above,   No Fever-chills, No Headache, No changes with Vision or hearing, No problems swallowing food or Liquids, +ve Chest pain, Cough or Shortness of Breath, No Abdominal pain, No Nausea or Vommitting, Bowel movements are regular, No Blood in stool or Urine, No dysuria, No new skin rashes or bruises, No new joints pains-aches,  No new weakness, tingling, numbness in any extremity, No recent weight gain or loss, No polyuria, polydypsia or polyphagia, No significant Mental Stressors.  A full 10 point Review of Systems was done, except as stated above, all other Review of Systems were negative.   Social History History  Substance Use Topics  . Smoking status: Former Smoker -- 1.00 packs/day    Types: Cigarettes  Quit date: 01/29/1981  . Smokeless tobacco: Never Used  . Alcohol Use: Yes     Comment: occasional      Family History Family History  Problem Relation Age of Onset  . Hyperlipidemia Other   . Hypertension Other   . Cancer      prostate  . Cancer Sister     uterine  . Colon cancer Sister 79      Prior to Admission medications   Medication Sig Start Date End Date Taking? Authorizing Provider  aspirin 81 MG tablet Take 81 mg by mouth daily.     Yes Historical Provider, MD  fluticasone (FLONASE) 50 MCG/ACT nasal spray Place 2 sprays into both nostrils daily as needed for allergies or rhinitis.  07/05/14  Yes Historical Provider, MD  imipramine (TOFRANIL) 10 MG tablet Take 10 mg by mouth at bedtime as needed (for sleep).   Yes Historical Provider, MD  LORazepam (ATIVAN) 0.5 MG tablet Take 0.5 mg by mouth daily as needed for anxiety.   Yes Historical Provider, MD  meloxicam (MOBIC) 15 MG tablet Take 15 mg by mouth daily.  01/26/14  Yes  Historical Provider, MD  methocarbamol (ROBAXIN) 500 MG tablet Take 500 mg by mouth daily as needed for muscle spasms.  10/19/14  Yes Historical Provider, MD  metoprolol succinate (TOPROL-XL) 25 MG 24 hr tablet Take 1 tablet (25 mg total) by mouth daily. 06/08/14  Yes Josue Hector, MD  Omega-3 Fatty Acids (FISH OIL) 1000 MG CAPS Take 1,000 mg by mouth daily.    Yes Historical Provider, MD  PARoxetine (PAXIL) 10 MG tablet Take 1 tablet (10 mg total) by mouth daily. 10/25/14  Yes [name], MD  imipramine (TOFRANIL) 10 MG tablet TAKE 1 TABLET BY MOUTH AT BEDTIME AS NEEDED Patient not taking: Reported on 11/02/2014    [name], MD  LORazepam (ATIVAN) 0.5 MG tablet TAKE 1/2-1 TABLET BY MOUTH EVERY DAY AS NEEDED Patient not taking: Reported on 11/02/2014    [name], MD  Rivaroxaban (XARELTO) 20 MG TABS tablet Take 1 tablet (20 mg total) by mouth as needed (takes only when flying). 08/26/13   Lisabeth Pick, MD    Allergies  Allergen Reactions  . Hydrocodone-Acetaminophen Shortness Of Breath  . Oxycodone Hcl Shortness Of Breath  . Telmisartan-Hctz Shortness Of Breath and Palpitations    Dizziness, too  . Ramipril   . Aspirin Other (See Comments)    Can't take high dose  . Atorvastatin Other (See Comments)    myalgia  . Buspirone Hcl Other (See Comments)    headache  . Codeine Phosphate Itching and Nausea Only  . Morphine Sulfate Itching  . Rabeprazole Other (See Comments)    headache    Physical Exam  Vitals  Blood pressure 167/88, pulse 63, temperature 97.7 F (36.5 C), temperature source Oral, resp. rate 15, weight 91.672 kg (202 lb 1.6 oz), SpO2 99 %.   1. General middle aged [place] male lying in bed in NAD,    2. Normal affect and insight, Not Suicidal or Homicidal, Awake Alert, Oriented X 3.  3. No F.N deficits, ALL C.Nerves Intact, Strength 5/5 all 4 extremities, Sensation intact all 4 extremities, Plantars down going.  4. Ears and Eyes appear Normal,  Conjunctivae clear, PERRLA. Moist Oral Mucosa.  5. Supple Neck, No JVD, No cervical lymphadenopathy appriciated, No Carotid Bruits.  6. Symmetrical Chest wall movement, Good air movement bilaterally, CTAB.  7. RRR, No Gallops, Rubs,  positive aortic systolic murmur, No Parasternal Heave.  8. Positive Bowel Sounds, Abdomen Soft, No tenderness, No organomegaly appriciated,No rebound -guarding or rigidity.  9.  No Cyanosis, Normal Skin Turgor, No Skin Rash or Bruise.  10. Good muscle tone,  joints appear normal , no effusions, Normal ROM.  11. No Palpable Lymph Nodes in Neck or Axillae     Data Review  CBC  Recent Labs Lab 11/02/14 1306  WBC 11.3*  HGB 15.0  HCT 44.8  PLT 162  MCV 91.8  MCH 30.7  MCHC 33.5  RDW 13.2   ------------------------------------------------------------------------------------------------------------------  Chemistries   Recent Labs Lab 11/02/14 1306  NA 141  K 4.5  CL 107  CO2 27  GLUCOSE 98  BUN 16  CREATININE 1.34  CALCIUM 9.4   ------------------------------------------------------------------------------------------------------------------ estimated creatinine clearance is 60.1 mL/min (by C-G formula based on Cr of 1.34). ------------------------------------------------------------------------------------------------------------------ No results for input(s): TSH, T4TOTAL, T3FREE, THYROIDAB in the last 72 hours.  Invalid input(s): FREET3   Coagulation profile No results for input(s): INR, PROTIME in the last 168 hours. ------------------------------------------------------------------------------------------------------------------- No results for input(s): DDIMER in the last 72 hours. -------------------------------------------------------------------------------------------------------------------  Cardiac Enzymes No results for input(s): CKMB, TROPONINI, MYOGLOBIN in the last 168 hours.  Invalid input(s):  CK ------------------------------------------------------------------------------------------------------------------ Invalid input(s): POCBNP   ---------------------------------------------------------------------------------------------------------------  Urinalysis    Component Value Date/Time   COLORURINE YELLOW 07/25/2010 2108   APPEARANCEUR CLEAR 07/25/2010 2108   LABSPEC 1.021 07/25/2010 2108   PHURINE 6.0 07/25/2010 2108   GLUCOSEU NEGATIVE 07/25/2010 2108   HGBUR SMALL* 07/25/2010 2108   HGBUR negative 03/20/2010 0843   BILIRUBINUR n 03/22/2011   BILIRUBINUR NEGATIVE 07/25/2010 2108   KETONESUR NEGATIVE 07/25/2010 2108   PROTEINUR n 03/22/2011   PROTEINUR NEGATIVE 07/25/2010 2108   UROBILINOGEN 0.2 03/22/2011   UROBILINOGEN 0.2 07/25/2010 2108   NITRITE n 03/22/2011   NITRITE NEGATIVE 07/25/2010 2108   LEUKOCYTESUR n 03/22/2011    ----------------------------------------------------------------------------------------------------------------  Imaging results:   Dg Chest 2 View  11/02/2014   CLINICAL DATA:  Shortness of breath. Left-sided chest pain. Cardiac valve replacement 3 years ago.  EXAM: CHEST  2 VIEW  COMPARISON:  Multiple exams, including 09/19/2010  FINDINGS: 1.4 by 0.9 cm nodular density projects over the right mid lung, and is not readily apparent on prior exams.  Lower thoracic spondylosis. Aortic valve prosthesis. Metal artifact in the right upper arm. Costosternal fixation wires noted.  No cardiomegaly.  No pleural effusion.  IMPRESSION: 1. 1.4 by 0.9 cm pulmonary nodule in the right mid to lower lung, not shown on prior exams. Lung cancer is not excluded and chest CT is recommended to further work this up. If the patient's chest pain raises the possibility of pulmonary embolus, and pulmonary embolus protocol CT angiography of the chest could be utilized to assess both the nodule and the vasculature. 2. Aortic valve prosthesis.   Electronically Signed   By:  Sherryl Barters M.D.   On: 11/02/2014 13:42   Ct Angio Chest Pe W/cm &/or Wo Cm  11/02/2014   CLINICAL DATA:  Left-sided chest pain starting yesterday. Pleuritic.  EXAM: CT ANGIOGRAPHY CHEST WITH CONTRAST  TECHNIQUE: Multidetector CT imaging of the chest was performed using the standard protocol during bolus administration of intravenous contrast. Multiplanar CT image reconstructions and MIPs were obtained to evaluate the vascular anatomy.  CONTRAST:  71mL OMNIPAQUE IOHEXOL 350 MG/ML SOLN  COMPARISON:  Multiple exams, including 09/19/2010  FINDINGS: There is acute pulmonary embolus in the right lower lobe and  right middle lobe pulmonary arteries, in the left lower lobe, and possibly in the lingula. Clot burden is moderate. Right ventricular to left ventricular ratio within normal limits at 0.87. Mild cardiomegaly is present and there is a prosthetic aortic valve.  A lymph node anterior to the carina measures 1.1 cm in short axis, image 30 series 4. Small bilateral axillary lymph nodes are observed.  Posteriorly in the right lower lobe on image 46 of series 6, there is a 1.4 by 1.0 cm noncalcified pulmonary nodule corresponding to the radiographic abnormality detected earlier today. There was no nodule in this location on 09/19/2010.  Chronic nodular anterior pleural scarring along the right middle lobe, images 56-58 of series 6, relatively stable from 2011 and accordingly considered benign. There is some atelectasis or scarring in the lingula. Dependent atelectasis in the superior segment left lower lobe.  Calcifications in the spleen suggest remote histoplasmosis.  Review of the MIP images confirms the above findings.  IMPRESSION: 1. Acute pulmonary embolus in both lungs.  Moderate clot burden. 2. 1.4 by 1.0 cm noncalcified pulmonary nodule in the right lower lobe, new compared to 09/19/2010, and concerning for possible lung cancer. Non urgent biopsy or PET-CT will be warranted following resolution of the  patient's acute situation in order to further workup this lesion. There is also a mildly enlarged lower right paratracheal node anterior to the carina which could be involved. 3. Mild cardiomegaly, primarily involving the ventricles. Critical Value/emergent results were called by telephone at the time of interpretation on 11/02/2014 at 3:30 pm to Dr. Veryl Speak , who verbally acknowledged these results.   Electronically Signed   By: Sherryl Barters M.D.   On: 11/02/2014 15:30    My personal review of EKG: Rhythm NSR, RBBB, non specific ST changes    Assessment & Plan    1. Recurrent PE. Patient was not on scheduled anticoagulation and was taking xaralto only during his travels, last travel & xaralto in October, have ordered a hypercoagulable panel which has been drawn prior to anticoagulation, IV heparin per pharmacy for 24 hours, lower extremity venous duplex to assess clot burden. Likely can be converted to xaralto after 24 hours.   2. Lung nodule. With the history of recurrent PE suspicious for malignancy, had called pulmonary critical care Dr Halford Chessman to see if they would consider biopsy prior to full anticoagulation, they requested patient follow with them in the office and that no workup in the inpatient setting at this time.   3. Prostate cancer. Outpatient follow-up with PCP and urologist postdischarge.   4. History of paroxysmal atrial fibrillation. No acute issues in sinus currently.    5.Essential hypertension. Continue Toprol-XL.      DVT Prophylaxis Heparin    AM Labs Ordered, also please review Full Orders  Family Communication: Admission, patients condition and plan of care including tests being ordered have been discussed with the patient   who indicates understanding and agree with the plan and Code Status.  Code Status full  Likely DC to  Home  Condition GUARDED     Time spent in minutes : 35    SINGH,PRASHANT K M.D on 11/02/2014 at 4:18 PM  Between  7am to 7pm - Pager - [phone]  After 7pm go to [email or web address] - [facility] Office  [phone]

## 2014-11-02 NOTE — ED Provider Notes (Signed)
CSN: [id number]     Arrival date & time 11/02/14  1227 History   First MD Initiated Contact with Patient 11/02/14 1325     Chief Complaint  Patient presents with  . Chest Pain     (Consider location/radiation/quality/duration/timing/severity/associated sxs/prior Treatment) HPI Comments: Patient is a 68 year old male with history of atrial fibrillation and prior pulmonary embolism. He presents today with pleuritic left-sided chest pain since yesterday. He was at the dentist office yesterday and was advised to come for evaluation, however he refused at that time. He presents today not improving. He denies fevers or chills. He denies productive cough. He denies hemoptysis. He is not currently taking any anticoagulants.  Patient is a 68 y.o. male presenting with chest pain. The history is provided by the patient.  Chest Pain Pain location:  L chest Pain quality: sharp   Pain radiates to:  Does not radiate Pain radiates to the back: no   Pain severity:  Moderate Onset quality:  Sudden Duration:  24 hours Timing:  Constant Progression:  Worsening Chronicity:  New Context: breathing   Relieved by:  Nothing Worsened by:  Exertion and deep breathing Ineffective treatments:  None tried   Past Medical History  Diagnosis Date  . Hypertension   . Hyperlipidemia   . Atrial fibrillation   . Mitral regurgitation   . History of pulmonary embolism 2011  . Aortic regurgitation 04/2007 surgery    Resolved with [facility]R at [facility]  . History of prostate cancer   . Anticoagulants causing adverse effect in therapeutic use   . Migraine syndrome   . Cervical spondylosis     ESI by [name] in Ortho   Past Surgical History  Procedure Laterality Date  . Cardiac catheterization    . Penile prosthesis implant    . Prostate surgery    . Cataract extraction      L 12/06/09   R 09/14/09  . Aortic valve replacement  2011    [facility]    Family History  Problem Relation Age of Onset  . Hyperlipidemia Other    . Hypertension Other   . Cancer      prostate  . Cancer Sister     uterine  . Colon cancer Sister 63   History  Substance Use Topics  . Smoking status: Former Smoker -- 1.00 packs/day    Types: Cigarettes    Quit date: 01/29/1981  . Smokeless tobacco: Never Used  . Alcohol Use: Yes     Comment: occasional    Review of Systems  Cardiovascular: Positive for chest pain.  All other systems reviewed and are negative.     Allergies  Hydrocodone-acetaminophen; Oxycodone hcl; Telmisartan-hctz; Ramipril; Aspirin; Atorvastatin; Buspirone hcl; Codeine phosphate; Morphine sulfate; and Rabeprazole  Home Medications   Prior to Admission medications   Medication Sig Start Date End Date Taking? Authorizing Provider  aspirin 81 MG tablet Take 81 mg by mouth daily.     Yes Historical Provider, MD  fluticasone (FLONASE) 50 MCG/ACT nasal spray Place 2 sprays into both nostrils daily as needed for allergies or rhinitis.  07/05/14  Yes Historical Provider, MD  imipramine (TOFRANIL) 10 MG tablet Take 10 mg by mouth at bedtime as needed (for sleep).   Yes Historical Provider, MD  LORazepam (ATIVAN) 0.5 MG tablet Take 0.5 mg by mouth daily as needed for anxiety.   Yes Historical Provider, MD  meloxicam (MOBIC) 15 MG tablet Take 15 mg by mouth daily.  01/26/14  Yes Historical Provider,  MD  methocarbamol (ROBAXIN) 500 MG tablet Take 500 mg by mouth daily as needed for muscle spasms.  10/19/14  Yes Historical Provider, MD  metoprolol succinate (TOPROL-XL) 25 MG 24 hr tablet Take 1 tablet (25 mg total) by mouth daily. 06/08/14  Yes Josue Hector, MD  Omega-3 Fatty Acids (FISH OIL) 1000 MG CAPS Take 1,000 mg by mouth daily.    Yes Historical Provider, MD  PARoxetine (PAXIL) 10 MG tablet Take 1 tablet (10 mg total) by mouth daily. 10/25/14  Yes [name], MD  imipramine (TOFRANIL) 10 MG tablet TAKE 1 TABLET BY MOUTH AT BEDTIME AS NEEDED Patient not taking: Reported on 11/02/2014    [name],  MD  LORazepam (ATIVAN) 0.5 MG tablet TAKE 1/2-1 TABLET BY MOUTH EVERY DAY AS NEEDED Patient not taking: Reported on 11/02/2014    [name], MD  Rivaroxaban (XARELTO) 20 MG TABS tablet Take 1 tablet (20 mg total) by mouth as needed (takes only when flying). 08/26/13   Lisabeth Pick, MD   BP 163/96 mmHg  Pulse 64  Temp(Src) 97.7 F (36.5 C) (Oral)  Resp 18  Wt 202 lb 1.6 oz (91.672 kg)  SpO2 99% Physical Exam  Constitutional: He is oriented to person, place, and time. He appears well-developed and well-nourished. No distress.  HENT:  Head: Normocephalic and atraumatic.  Neck: Normal range of motion. Neck supple.  Cardiovascular: Normal rate, regular rhythm and normal heart sounds.   No murmur heard. Pulmonary/Chest: Effort normal and breath sounds normal. No respiratory distress. He has no wheezes.  Abdominal: Soft. Bowel sounds are normal. He exhibits no distension. There is no tenderness.  Musculoskeletal: Normal range of motion. He exhibits no edema.  Lymphadenopathy:    He has no cervical adenopathy.  Neurological: He is alert and oriented to person, place, and time.  Skin: Skin is warm and dry. He is not diaphoretic.  Nursing note and vitals reviewed.   ED Course  Procedures (including critical care time) Labs Review Labs Reviewed  CBC - Abnormal; Notable for the following:    WBC 11.3 (*)    All other components within normal limits  BASIC METABOLIC PANEL - Abnormal; Notable for the following:    GFR calc non Af Amer 53 (*)    GFR calc Af Amer 61 (*)    All other components within normal limits  ANTITHROMBIN III  PROTEIN C ACTIVITY  PROTEIN C, TOTAL  PROTEIN S ACTIVITY  PROTEIN S, TOTAL  LUPUS ANTICOAGULANT PANEL  BETA-2-GLYCOPROTEIN I ABS, IGG/M/A  HOMOCYSTEINE  FACTOR 5 LEIDEN  PROTHROMBIN GENE MUTATION  CARDIOLIPIN ANTIBODIES, IGG, IGM, IGA  I-STAT TROPOININ, ED    Imaging Review Dg Chest 2 View  11/02/2014   CLINICAL DATA:  Shortness of  breath. Left-sided chest pain. Cardiac valve replacement 3 years ago.  EXAM: CHEST  2 VIEW  COMPARISON:  Multiple exams, including 09/19/2010  FINDINGS: 1.4 by 0.9 cm nodular density projects over the right mid lung, and is not readily apparent on prior exams.  Lower thoracic spondylosis. Aortic valve prosthesis. Metal artifact in the right upper arm. Costosternal fixation wires noted.  No cardiomegaly.  No pleural effusion.  IMPRESSION: 1. 1.4 by 0.9 cm pulmonary nodule in the right mid to lower lung, not shown on prior exams. Lung cancer is not excluded and chest CT is recommended to further work this up. If the patient's chest pain raises the possibility of pulmonary embolus, and pulmonary embolus protocol CT angiography  of the chest could be utilized to assess both the nodule and the vasculature. 2. Aortic valve prosthesis.   Electronically Signed   By: Sherryl Barters M.D.   On: 11/02/2014 13:42   Ct Angio Chest Pe W/cm &/or Wo Cm  11/02/2014   CLINICAL DATA:  Left-sided chest pain starting yesterday. Pleuritic.  EXAM: CT ANGIOGRAPHY CHEST WITH CONTRAST  TECHNIQUE: Multidetector CT imaging of the chest was performed using the standard protocol during bolus administration of intravenous contrast. Multiplanar CT image reconstructions and MIPs were obtained to evaluate the vascular anatomy.  CONTRAST:  1mL OMNIPAQUE IOHEXOL 350 MG/ML SOLN  COMPARISON:  Multiple exams, including 09/19/2010  FINDINGS: There is acute pulmonary embolus in the right lower lobe and right middle lobe pulmonary arteries, in the left lower lobe, and possibly in the lingula. Clot burden is moderate. Right ventricular to left ventricular ratio within normal limits at 0.87. Mild cardiomegaly is present and there is a prosthetic aortic valve.  A lymph node anterior to the carina measures 1.1 cm in short axis, image 30 series 4. Small bilateral axillary lymph nodes are observed.  Posteriorly in the right lower lobe on image 46 of series  6, there is a 1.4 by 1.0 cm noncalcified pulmonary nodule corresponding to the radiographic abnormality detected earlier today. There was no nodule in this location on 09/19/2010.  Chronic nodular anterior pleural scarring along the right middle lobe, images 56-58 of series 6, relatively stable from 2011 and accordingly considered benign. There is some atelectasis or scarring in the lingula. Dependent atelectasis in the superior segment left lower lobe.  Calcifications in the spleen suggest remote histoplasmosis.  Review of the MIP images confirms the above findings.  IMPRESSION: 1. Acute pulmonary embolus in both lungs.  Moderate clot burden. 2. 1.4 by 1.0 cm noncalcified pulmonary nodule in the right lower lobe, new compared to 09/19/2010, and concerning for possible lung cancer. Non urgent biopsy or PET-CT will be warranted following resolution of the patient's acute situation in order to further workup this lesion. There is also a mildly enlarged lower right paratracheal node anterior to the carina which could be involved. 3. Mild cardiomegaly, primarily involving the ventricles. Critical Value/emergent results were called by telephone at the time of interpretation on 11/02/2014 at 3:30 pm to Dr. Veryl Speak , who verbally acknowledged these results.   Electronically Signed   By: Sherryl Barters M.D.   On: 11/02/2014 15:30     EKG Interpretation   Date/Time:  Wednesday November 02 2014 12:50:11 EST Ventricular Rate:  83 PR Interval:  178 QRS Duration: 136 QT Interval:  408 QTC Calculation: 479 R Axis:   -42 Text Interpretation:  Normal sinus rhythm Left axis deviation Right bundle  branch block Left ventricular hypertrophy Abnormal ECG Confirmed by DELOS   MD, Isamu Trammel ([id number]) on 11/02/2014 1:57:42 PM      MDM   Final diagnoses:  Pleuritic chest pain    Workup reveals bilateral pulmonary emboli with moderate clot burden. He is also found to have a small lung nodule in the right lower  lobe which is new compared to prior scan. Radiology raises concerns for malignancy.  I've spoken with Dr. Candiss Norse from the hospitalist service who agrees to admit the patient to his service. He has asked that I speak with pulmonary regarding the lung nodule and whether they need to intervene prior to anticoagulation. They do not feel as though any intervention is necessary in the acute phase of the pulmonary  embolism. He will be admitted to the hospitalist service.    Veryl Speak, MD 11/02/14 [phone]

## 2014-11-02 NOTE — ED Notes (Signed)
Patient asked for and received a Happy Meal and a drink.

## 2014-11-02 NOTE — ED Notes (Signed)
Pt going to xray before coming to room

## 2014-11-02 NOTE — ED Notes (Signed)
Pt has returned from xray department; pt undressed, in gown, on monitor, continuous pulse oximetry and blood pressure cuff; Doroteo Bradford, RN present in room

## 2014-11-02 NOTE — ED Notes (Signed)
Pt being taken out of the department for testing

## 2014-11-03 DIAGNOSIS — I2699 Other pulmonary embolism without acute cor pulmonale: Secondary | ICD-10-CM

## 2014-11-03 DIAGNOSIS — Z8546 Personal history of malignant neoplasm of prostate: Secondary | ICD-10-CM

## 2014-11-03 LAB — LUPUS ANTICOAGULANT PANEL
DRVVT: 35.6 secs (ref <42.9)
Lupus Anticoagulant: NOT DETECTED
PTT Lupus Anticoagulant: 37.6 secs (ref 28.0–43.0)

## 2014-11-03 LAB — CBC
HCT: 40.5 % (ref 39.0–52.0)
Hemoglobin: 13.7 g/dL (ref 13.0–17.0)
MCH: 30.9 pg (ref 26.0–34.0)
MCHC: 33.8 g/dL (ref 30.0–36.0)
MCV: 91.4 fL (ref 78.0–100.0)
Platelets: 157 10*3/uL (ref 150–400)
RBC: 4.43 MIL/uL (ref 4.22–5.81)
RDW: 13.3 % (ref 11.5–15.5)
WBC: 11.5 10*3/uL — ABNORMAL HIGH (ref 4.0–10.5)

## 2014-11-03 LAB — BETA-2-GLYCOPROTEIN I ABS, IGG/M/A
Beta-2 Glyco I IgG: 12 G Units (ref <20)
Beta-2-Glycoprotein I IgA: 10 A Units (ref <20)
Beta-2-Glycoprotein I IgM: 11 M Units (ref <20)

## 2014-11-03 LAB — CARDIOLIPIN ANTIBODIES, IGG, IGM, IGA
Anticardiolipin IgA: 8 APL U/mL — ABNORMAL LOW (ref <22)
Anticardiolipin IgG: 8 GPL U/mL — ABNORMAL LOW (ref <23)
Anticardiolipin IgM: 2 MPL U/mL — ABNORMAL LOW (ref <11)

## 2014-11-03 LAB — HEPARIN LEVEL (UNFRACTIONATED): Heparin Unfractionated: 0.58 IU/mL (ref 0.30–0.70)

## 2014-11-03 LAB — BASIC METABOLIC PANEL
Anion gap: 7 (ref 5–15)
BUN: 13 mg/dL (ref 6–23)
CO2: 26 mmol/L (ref 19–32)
Calcium: 8.7 mg/dL (ref 8.4–10.5)
Chloride: 104 mEq/L (ref 96–112)
Creatinine, Ser: 1.13 mg/dL (ref 0.50–1.35)
GFR calc Af Amer: 75 mL/min — ABNORMAL LOW (ref >90)
GFR calc non Af Amer: 65 mL/min — ABNORMAL LOW (ref >90)
Glucose, Bld: 92 mg/dL (ref 70–99)
Potassium: 3.8 mmol/L (ref 3.5–5.1)
Sodium: 137 mmol/L (ref 135–145)

## 2014-11-03 LAB — PROTEIN S, TOTAL: Protein S Ag, Total: 99 % (ref 60–150)

## 2014-11-03 LAB — PROTEIN C, TOTAL: Protein C, Total: 83 % (ref 72–160)

## 2014-11-03 LAB — PROTEIN S ACTIVITY: Protein S Activity: 79 % (ref 69–129)

## 2014-11-03 LAB — HOMOCYSTEINE: Homocysteine: 14.5 umol/L (ref 4.0–15.4)

## 2014-11-03 LAB — PROTEIN C ACTIVITY: Protein C Activity: 126 % (ref 75–133)

## 2014-11-03 MED ORDER — RIVAROXABAN 20 MG PO TABS: 20.0000 mg | ORAL_TABLET | Freq: Every day | ORAL | Status: DC

## 2014-11-03 MED ORDER — GUAIFENESIN-DM 100-10 MG/5ML PO SYRP: 5.0000 mL | ORAL_SOLUTION | ORAL | Status: DC | PRN

## 2014-11-03 MED ORDER — RIVAROXABAN 15 MG PO TABS
15.0000 mg | ORAL_TABLET | Freq: Two times a day (BID) | ORAL | Status: DC
  Filled 2014-11-03 (×3): qty 1

## 2014-11-03 MED ORDER — RIVAROXABAN (XARELTO) VTE STARTER PACK (15 & 20 MG): ORAL_TABLET | ORAL | Status: DC

## 2014-11-03 MED ORDER — RIVAROXABAN 15 MG PO TABS
15.0000 mg | ORAL_TABLET | Freq: Two times a day (BID) | ORAL | Status: DC
  Administered 2014-11-03: 15 mg via ORAL
  Filled 2014-11-03 (×2): qty 1

## 2014-11-03 NOTE — Progress Notes (Signed)
VASCULAR LAB PRELIMINARY  PRELIMINARY  PRELIMINARY  PRELIMINARY  Bilateral lower extremity venous duplex completed.    Preliminary report:  Negative for deep and superficial vein thrombosis.   Zoya Sprecher, RVT 11/03/2014, 12:46 PM

## 2014-11-03 NOTE — Discharge Instructions (Signed)
Pulmonary Embolism A pulmonary (lung) embolism (PE) is a blood clot that has traveled to the lung and results in a blockage of blood flow in the affected lung. Most clots come from deep veins in the legs or pelvis. PE is a dangerous and potentially life-threatening condition that can be treated if identified. CAUSES Blood clots form in a vein for different reasons. Usually several things cause blood clots. They include:  The flow of blood slows down.  The inside of the vein is damaged in some way.  The person has a condition that makes the blood clot more easily. RISK FACTORS Some people are more likely than others to develop PE. Risk factors include:   Smoking.  Being overweight (obese).  Sitting or lying still for a long time. This includes long-distance travel, paralysis, or recovery from an illness or surgery. Other factors that increase risk are:   Older age, especially over 75 years of age.  Having a family history of blood clots or if you have already had a blood clot.  Having major or lengthy surgery. This is especially true for surgery on the hip, knee, or belly (abdomen). Hip surgery is particularly high risk.  Having a long, thin tube (catheter) placed inside a vein during a medical procedure.  Breaking a hip or leg.  Having cancer or cancer treatment.  Medicines containing the male hormone estrogen. This includes birth control pills and hormone replacement therapy.  Other circulation or heart problems.  Pregnancy and childbirth.  Hormone changes make the blood clot more easily during pregnancy.  The fetus puts pressure on the veins of the pelvis.  There is a risk of injury to veins during delivery or a caesarean delivery. The risk is highest just after childbirth.  PREVENTION   Exercise the legs regularly. Take a brisk 30 minute walk every day.  Maintain a weight that is appropriate for your height.  Avoid sitting or lying in bed for long periods of  time without moving your legs.  Women, particularly those over the age of 35 years, should consider the risks and benefits of taking estrogen medicines, including birth control pills.  Do not smoke, especially if you take estrogen medicines.  Long-distance travel can increase your risk. You should exercise your legs by walking or pumping the muscles every hour.  Many of the risk factors above relate to situations that exist with hospitalization, either for illness, injury, or elective surgery. Prevention may include medical and nonmedical measures.   Your health care provider will assess you for the need for venous thromboembolism prevention when you are admitted to the hospital. If you are having surgery, your surgeon will assess you the day of or day after surgery.  SYMPTOMS  The symptoms of a PE usually start suddenly and include:  Shortness of breath.  Coughing.  Coughing up blood or blood-tinged mucus.  Chest pain. Pain is often worse with deep breaths.  Rapid heartbeat. DIAGNOSIS  If a PE is suspected, your health care provider will take a medical history and perform a physical exam. Other tests that may be required include:  Blood tests, such as studies of the clotting properties of your blood.  Imaging tests, such as ultrasound, CT, MRI, and other tests to see if you have clots in your legs or lungs.  An electrocardiogram. This can look for heart strain from blood clots in the lungs. TREATMENT   The most common treatment for a PE is blood thinning (anticoagulant) medicine, which reduces   the blood's tendency to clot. Anticoagulants can stop new blood clots from forming and old clots from growing. They cannot dissolve existing clots. Your body does this by itself over time. Anticoagulants can be given by mouth, through an intravenous (IV) tube, or by injection. Your health care provider will determine the best program for you.  Less commonly, clot-dissolving medicines  (thrombolytics) are used to dissolve a PE. They carry a high risk of bleeding, so they are used mainly in severe cases.  Very rarely, a blood clot in the leg needs to be removed surgically.  If you are unable to take anticoagulants, your health care provider may arrange for you to have a filter placed in a main vein in your abdomen. This filter prevents clots from traveling to your lungs. HOME CARE INSTRUCTIONS   Take all medicines as directed by your health care provider.  Learn as much as you can about DVT.  Wear a medical alert bracelet or carry a medical alert card.  Ask your health care provider how soon you can go back to normal activities. It is important to stay active to prevent blood clots. If you are on anticoagulant medicine, avoid contact sports.  It is very important to exercise. This is especially important while traveling, sitting, or standing for long periods of time. Exercise your legs by walking or by tightening and relaxing your leg muscles regularly. Take frequent walks.  You may need to wear compression stockings. These are tight elastic stockings that apply pressure to the lower legs. This pressure can help keep the blood in the legs from clotting. Taking Warfarin Warfarin is a daily medicine that is taken by mouth. Your health care provider will advise you on the length of treatment (usually 3-6 months, sometimes lifelong). If you take warfarin:  Understand how to take warfarin and foods that can affect how warfarin works in your body.  Too much and too little warfarin are both dangerous. Too much warfarin increases the risk of bleeding. Too little warfarin continues to allow the risk for blood clots. Warfarin and Regular Blood Testing While taking warfarin, you will need to have regular blood tests to measure your blood clotting time. These blood tests usually include both the prothrombin time (PT) and international normalized ratio (INR) tests. The PT and INR  results allow your health care provider to adjust your dose of warfarin. It is very important that you have your PT and INR tested as often as directed by your health care provider.  Warfarin and Your Diet Avoid major changes in your diet, or notify your health care provider before changing your diet. Arrange a visit with a registered dietitian to answer your questions. Many foods, especially foods high in vitamin K, can interfere with warfarin and affect the PT and INR results. You should eat a consistent amount of foods high in vitamin K. Foods high in vitamin K include:   Spinach, kale, broccoli, cabbage, collard and turnip greens, Brussels sprouts, peas, cauliflower, seaweed, and parsley.  Beef and pork liver.  Green tea.  Soybean oil. Warfarin with Other Medicines Many medicines can interfere with warfarin and affect the PT and INR results. You must:  Tell your health care provider about any and all medicines, vitamins, and supplements you take, including aspirin and other over-the-counter anti-inflammatory medicines. Be especially cautious with aspirin and anti-inflammatory medicines. Ask your health care provider before taking these.  Do not take or discontinue any prescribed or over-the-counter medicine except on the advice   of your health care provider or pharmacist. Warfarin Side Effects Warfarin can have side effects, such as easy bruising and difficulty stopping bleeding. Ask your health care provider or pharmacist about other side effects of warfarin. You will need to:  Hold pressure over cuts for longer than usual.  Notify your dentist and other health care providers that you are taking warfarin before you undergo any procedures where bleeding may occur. Warfarin with Alcohol and Tobacco   Drinking alcohol frequently can increase the effect of warfarin, leading to excess bleeding. It is best to avoid alcoholic drinks or consume only very small amounts while taking warfarin.  Notify your health care provider if you change your alcohol intake.  Do not use any tobacco products including cigarettes, chewing tobacco, or electronic cigarettes. If you smoke, quit. Ask your health care provider for help with quitting smoking. Alternative Medicines to Warfarin: Factor Xa Inhibitor Medicines  These blood thinning medicines are taken by mouth, usually for several weeks or longer. It is important to take the medicine every single day, at the same time each day.  There are no regular blood tests required when using these medicines.  There are fewer food and drug interactions than with warfarin.  The side effects of this class of medicine is similar to that of warfarin, including excessive bruising or bleeding. Ask your health care provider or pharmacist about other potential side effects. SEEK MEDICAL CARE IF:   You notice a rapid heartbeat.  You feel weaker or more tired than usual.  You feel faint.  You notice increased bruising.  Your symptoms are not getting better in the time expected.  You are having side effects of medicine. SEEK IMMEDIATE MEDICAL CARE IF:   You have chest pain.  You have trouble breathing.  You have new or increased swelling or pain in one leg.  You cough up blood.  You notice blood in vomit, in a bowel movement, or in urine.  You have a fever. Symptoms of PE may represent a serious problem that is an emergency. Do not wait to see if the symptoms will go away. Get medical help right away. Call your local emergency services (911 in the [place]). Do not drive yourself to the hospital. Document Released: 10/25/2000 Document Revised: 03/14/2014 Document Reviewed: 11/08/2013 [employer] Patient Information 2015 [employer]. This information is not intended to replace advice given to you by your health care provider. Make sure you discuss any questions you have with your health care provider.  

## 2014-11-03 NOTE — Progress Notes (Signed)
Patient discharged home per orders. IV/Tele discontinued. All d/c orders/instructions/medications/follow up care/follow up appointments discussed. Discussed Xarelto schedule with him. Time allowed for questions and concerns, patient states he has none at this time. Patient dressed self and packed up belongings independently. Patient denies pain or discomfort at this time. I walked the patient (wheelchair refused) to the ED where the patient's car was parked. Patient stated he would drive himself home.  Roselyn Reef Sapphira Harjo,RN

## 2014-11-03 NOTE — Progress Notes (Addendum)
ANTICOAGULATION CONSULT NOTE - Follow Up Consult  Pharmacy Consult for Heparin >> Xarelto (see addendum) Indication: Recurrent PE and hx Afib  Allergies  Allergen Reactions  . Hydrocodone-Acetaminophen Shortness Of Breath  . Oxycodone Hcl Shortness Of Breath  . Telmisartan-Hctz Shortness Of Breath and Palpitations    Dizziness, too  . Ramipril   . Aspirin Other (See Comments)    Can't take high dose  . Atorvastatin Other (See Comments)    myalgia  . Buspirone Hcl Other (See Comments)    headache  . Codeine Phosphate Itching and Nausea Only  . Morphine Sulfate Itching  . Rabeprazole Other (See Comments)    headache    Patient Measurements: Height: 5\' 10"  (177.8 cm) Weight: 197 lb 4.8 oz (89.495 kg) IBW/kg (Calculated) : 73 Heparin Dosing Weight: 90 kg  Vital Signs: Temp: 98.5 F (36.9 C) (12/24 0435) Temp Source: Oral (12/24 0435) BP: 145/86 mmHg (12/24 0435) Pulse Rate: 66 (12/24 0435)  Labs:  Recent Labs  11/02/14 1306 11/02/14 2235 11/03/14 0507  HGB 15.0  --  13.7  HCT 44.8  --  40.5  PLT 162  --  157  HEPARINUNFRC  --  0.64 0.58  CREATININE 1.34  --  1.13    Estimated Creatinine Clearance: 70.4 mL/min (by C-G formula based on Cr of 1.13).   Assessment: 30 YOM with history of Afib and prior PE presents with pleuritic left-sided chest pain x 1 day and found to have recurrent PE >> started on heparin for anticoagulation.  The patient's heparin level this morning remains therapeutic (HL 0.58 << 0.64, goal of 0.3-0.7). CBC wnl - no overt s/sx of bleeding noted. Will follow-up plans to transition the patient to oral anticoagulation with appropriate discharge counseling. It is noted the patient was only taking Xarelto during travels PTA and not regularly.  Goal of Therapy:  Heparin level 0.3-0.7 units/ml Monitor platelets by anticoagulation protocol: Yes   Plan:  1. Continue heparin at 1500 units/hr (15 ml/hr) 2. Will continue to monitor for any  signs/symptoms of bleeding and will follow up with heparin level in the a.m.   Alycia Rossetti, PharmD, BCPS Clinical Pharmacist Pager: [phone] 11/03/2014 10:57 AM    ---------------------------------------------------------------------------------------------------------  Pharmacy now consulted to transition the patient from heparin to oral Xarelto today. Since it is already the afternoon, will plan to make this transition this evening when the patient receives supper. Xarelto should be started at the time the heparin drip is stopped - this was discussed with the RN.  The patient was educated on Xarelto today.  Plan 1. Discontinue Heparin at 1630 2. At time of heparin d/c, start Xarelto 15 mg bidwc x 21 days (12/24 >> 1/13) followed by Xarelto 20 mg daily with supper (starting on 1/14) 3. Will continue to monitor for tolerance, bleeding, need for dose adjustments  Alycia Rossetti, PharmD, BCPS Clinical Pharmacist Pager: [phone] 11/03/2014 12:53 PM

## 2014-11-03 NOTE — Progress Notes (Signed)
ANTICOAGULATION CONSULT NOTE - Follow Up Consult  Pharmacy Consult for heparin Indication: pulmonary embolus   Labs:  Recent Labs  11/02/14 1306 11/02/14 2235  HGB 15.0  --   HCT 44.8  --   PLT 162  --   HEPARINUNFRC  --  0.64  CREATININE 1.34  --       Assessment/Plan:  68yo male therapeutic on heparin with initial dosing for PE. Will continue gtt at current rate and confirm stable with am labs.   Wynona Neat, PharmD, BCPS  11/03/2014,12:14 AM

## 2014-11-03 NOTE — Progress Notes (Signed)
UR Completed.  [phone]  

## 2014-11-03 NOTE — Discharge Summary (Signed)
Physician Discharge Summary  [name] XMI:[id number] DOB: [birth year] DOA: 11/02/2014  PCP: [name], MD  Admit date: 11/02/2014 Discharge date: 11/03/2014  Time spent: 45 minutes  Recommendations for Outpatient Follow-up:  Patient will be discharged to home.  He is to continue his medications as prescribed. Patient should follow-up with his primary care physician within one week of discharge. Patient should follow a heart healthy diet. Patient may resume activity as tolerated.  Discharge Diagnoses:  Recurrent pulmonary embolism Lung nodule Prostate cancer History of paroxysmal atrial fibrillation Essential hypertension  Discharge Condition: Stable  Diet recommendation: Heart healthy  Filed Weights   11/02/14 1243 11/02/14 1655 11/03/14 0435  Weight: 91.672 kg (202 lb 1.6 oz) 91.901 kg (202 lb 9.7 oz) 89.495 kg (197 lb 4.8 oz)    History of present illness:  On 11/02/2014 by Dr. Norton Blizzard Morabito is a 68 y.o. male, history of PE few years ago, takes xaralto on as-needed basis when he travels or takes a long airplane flight, hypertension, dyslipidemia, paroxysmal atrial fibrillation, status post aortic valve replacement-tissue valve, who traveled to [place] in October and took [place] during his flight, he was feeling fine until about 24-hour was ago when he developed left-sided pleuritic chest pain, not associated with shortness of breath, no cough fever or chills, came to the ER where workup was suggestive of moderate burden bilateral PE on CT angiogram, a lung nodule, pulmonary critical care were called who recommended no further workup in the inpatient setting, hospitalist team was then called to admit.   Patient currently is symptom free except for pleuritic chest pain as above, in eyes any shortness of breath, no fever or chills, no abdominal pain, no blood in stool or urine, no focal weakness, no swelling in lower extremities, no unintentional weight  loss.  Hospital Course:  Recurrent pulmonary embolism -Patient was taken Xarelto in the past, he has not taken any since October 2015 -At this time, patient is not short of breath and wishes to be discharged home -Hypercoagulable panel has been drawn and is pending, primary care physician should follow these results -Patient initially placed on heparin drip and was given physician to [place] extremity Doppler was negative for DVT  Lung nodule -With history of recurrent PE, this is suspicious for malignancy -Pulmonology/critical care, Dr. Halford Chessman, was contacted by the admitting physician and recommended outpatient follow-up  Prostate cancer -Patient to continue following up with his urologist at discharge  History of paroxysmal atrial fibrillation -Currently in sinus rhythm  Essential hypertension -Controlled, continue Toprol  Procedures: Lower extremity Doppler: Negative for DVT  Consultations: PCCM, Dr. Halford Chessman  Discharge Exam: Filed Vitals:   11/03/14 0435  BP: 145/86  Pulse: 66  Temp: 98.5 F (36.9 C)  Resp: 19     General: Well developed, well nourished, NAD, appears stated age  HEENT: NCAT,  mucous membranes moist.  Cardiovascular: S1 S2 auscultated, RRR, 2/6 SEM  Respiratory: Clear to auscultation bilaterally with equal chest rise  Abdomen: Soft, nontender, nondistended, + bowel sounds  Extremities: warm dry without cyanosis clubbing or edema  Neuro: AAOx3, cranial nerves grossly intact. No focal deficits  Skin: Without rashes exudates or nodules  Psych: Normal affect and demeanor with intact judgement and insight  Discharge Instructions      Discharge Instructions    Discharge instructions    Complete by:  As directed   Patient will be discharged to home.  He is to continue his medications as prescribed. Patient should  follow-up with his primary care physician within one week of discharge. Patient should follow a heart healthy diet. Patient may  resume activity as tolerated.            Medication List    STOP taking these medications        rivaroxaban 20 MG Tabs tablet  Commonly known as:  XARELTO  Replaced by:  Rivaroxaban 15 & 20 MG Tbpk      TAKE these medications        aspirin 81 MG tablet  Take 81 mg by mouth daily.     Fish Oil 1000 MG Caps  Take 1,000 mg by mouth daily.     fluticasone 50 MCG/ACT nasal spray  Commonly known as:  FLONASE  Place 2 sprays into both nostrils daily as needed for allergies or rhinitis.     guaiFENesin-dextromethorphan 100-10 MG/5ML syrup  Commonly known as:  ROBITUSSIN DM  Take 5 mLs by mouth every 4 (four) hours as needed for cough.     imipramine 10 MG tablet  Commonly known as:  TOFRANIL  Take 10 mg by mouth at bedtime as needed (for sleep).     LORazepam 0.5 MG tablet  Commonly known as:  ATIVAN  Take 0.5 mg by mouth daily as needed for anxiety.     meloxicam 15 MG tablet  Commonly known as:  MOBIC  Take 15 mg by mouth daily.     methocarbamol 500 MG tablet  Commonly known as:  ROBAXIN  Take 500 mg by mouth daily as needed for muscle spasms.     metoprolol succinate 25 MG 24 hr tablet  Commonly known as:  TOPROL-XL  Take 1 tablet (25 mg total) by mouth daily.     PARoxetine 10 MG tablet  Commonly known as:  PAXIL  Take 1 tablet (10 mg total) by mouth daily.     Rivaroxaban 15 & 20 MG Tbpk  Commonly known as:  XARELTO STARTER PACK  Take as directed on package: Start with one 15mg  tablet by mouth twice a day with food. On Day 22, switch to one 20mg  tablet once a day with food.       Allergies  Allergen Reactions  . Hydrocodone-Acetaminophen Shortness Of Breath  . Oxycodone Hcl Shortness Of Breath  . Telmisartan-Hctz Shortness Of Breath and Palpitations    Dizziness, too  . Ramipril   . Aspirin Other (See Comments)    Can't take high dose  . Atorvastatin Other (See Comments)    myalgia  . Buspirone Hcl Other (See Comments)    headache  . Codeine  Phosphate Itching and Nausea Only  . Morphine Sulfate Itching  . Rabeprazole Other (See Comments)    headache   Follow-up Information    Follow up with MCGOWEN,PHILIP H, MD. Schedule an appointment as soon as possible for a visit in 1 week.   Specialty:  Family Medicine   Why:  Hospital follow-up   Contact information:   [address] [place] [id number] [phone]        The results of significant diagnostics from this hospitalization (including imaging, microbiology, ancillary and laboratory) are listed below for reference.    Significant Diagnostic Studies: Dg Chest 2 View  11/02/2014   CLINICAL DATA:  Shortness of breath. Left-sided chest pain. Cardiac valve replacement 3 years ago.  EXAM: CHEST  2 VIEW  COMPARISON:  Multiple exams, including 09/19/2010  FINDINGS: 1.4 by 0.9 cm nodular density projects  over the right mid lung, and is not readily apparent on prior exams.  Lower thoracic spondylosis. Aortic valve prosthesis. Metal artifact in the right upper arm. Costosternal fixation wires noted.  No cardiomegaly.  No pleural effusion.  IMPRESSION: 1. 1.4 by 0.9 cm pulmonary nodule in the right mid to lower lung, not shown on prior exams. Lung cancer is not excluded and chest CT is recommended to further work this up. If the patient's chest pain raises the possibility of pulmonary embolus, and pulmonary embolus protocol CT angiography of the chest could be utilized to assess both the nodule and the vasculature. 2. Aortic valve prosthesis.   Electronically Signed   By: Sherryl Barters M.D.   On: 11/02/2014 13:42   Ct Angio Chest Pe W/cm &/or Wo Cm  11/02/2014   CLINICAL DATA:  Left-sided chest pain starting yesterday. Pleuritic.  EXAM: CT ANGIOGRAPHY CHEST WITH CONTRAST  TECHNIQUE: Multidetector CT imaging of the chest was performed using the standard protocol during bolus administration of intravenous contrast. Multiplanar CT image reconstructions and MIPs were obtained to  evaluate the vascular anatomy.  CONTRAST:  19mL OMNIPAQUE IOHEXOL 350 MG/ML SOLN  COMPARISON:  Multiple exams, including 09/19/2010  FINDINGS: There is acute pulmonary embolus in the right lower lobe and right middle lobe pulmonary arteries, in the left lower lobe, and possibly in the lingula. Clot burden is moderate. Right ventricular to left ventricular ratio within normal limits at 0.87. Mild cardiomegaly is present and there is a prosthetic aortic valve.  A lymph node anterior to the carina measures 1.1 cm in short axis, image 30 series 4. Small bilateral axillary lymph nodes are observed.  Posteriorly in the right lower lobe on image 46 of series 6, there is a 1.4 by 1.0 cm noncalcified pulmonary nodule corresponding to the radiographic abnormality detected earlier today. There was no nodule in this location on 09/19/2010.  Chronic nodular anterior pleural scarring along the right middle lobe, images 56-58 of series 6, relatively stable from 2011 and accordingly considered benign. There is some atelectasis or scarring in the lingula. Dependent atelectasis in the superior segment left lower lobe.  Calcifications in the spleen suggest remote histoplasmosis.  Review of the MIP images confirms the above findings.  IMPRESSION: 1. Acute pulmonary embolus in both lungs.  Moderate clot burden. 2. 1.4 by 1.0 cm noncalcified pulmonary nodule in the right lower lobe, new compared to 09/19/2010, and concerning for possible lung cancer. Non urgent biopsy or PET-CT will be warranted following resolution of the patient's acute situation in order to further workup this lesion. There is also a mildly enlarged lower right paratracheal node anterior to the carina which could be involved. 3. Mild cardiomegaly, primarily involving the ventricles. Critical Value/emergent results were called by telephone at the time of interpretation on 11/02/2014 at 3:30 pm to Dr. Veryl Speak , who verbally acknowledged these results.    Electronically Signed   By: Sherryl Barters M.D.   On: 11/02/2014 15:30    Microbiology: No results found for this or any previous visit (from the past 240 hour(s)).   Labs: Basic Metabolic Panel:  Recent Labs Lab 11/02/14 1306 11/03/14 0507  NA 141 137  K 4.5 3.8  CL 107 104  CO2 27 26  GLUCOSE 98 92  BUN 16 13  CREATININE 1.34 1.13  CALCIUM 9.4 8.7   Liver Function Tests: No results for input(s): AST, ALT, ALKPHOS, BILITOT, PROT, ALBUMIN in the last 168 hours. No results for input(s): LIPASE,  AMYLASE in the last 168 hours. No results for input(s): AMMONIA in the last 168 hours. CBC:  Recent Labs Lab 11/02/14 1306 11/03/14 0507  WBC 11.3* 11.5*  HGB 15.0 13.7  HCT 44.8 40.5  MCV 91.8 91.4  PLT 162 157   Cardiac Enzymes: No results for input(s): CKTOTAL, CKMB, CKMBINDEX, TROPONINI in the last 168 hours. BNP: BNP (last 3 results) No results for input(s): PROBNP in the last 8760 hours. CBG: No results for input(s): GLUCAP in the last 168 hours.     SignedCristal Ford  [facility] 11/03/2014, 1:31 PM

## 2014-11-08 ENCOUNTER — Encounter: Payer: Self-pay | Admitting: Family Medicine

## 2014-11-09 ENCOUNTER — Other Ambulatory Visit (INDEPENDENT_AMBULATORY_CARE_PROVIDER_SITE_OTHER): Payer: Medicare Other

## 2014-11-09 ENCOUNTER — Other Ambulatory Visit: Payer: Medicare Other

## 2014-11-09 ENCOUNTER — Ambulatory Visit (INDEPENDENT_AMBULATORY_CARE_PROVIDER_SITE_OTHER): Payer: Medicare Other | Admitting: Family Medicine

## 2014-11-09 ENCOUNTER — Encounter: Payer: Self-pay | Admitting: Family Medicine

## 2014-11-09 VITALS — BP 152/93 | HR 71 | Temp 97.7°F | Resp 18 | Ht 70.0 in | Wt 201.0 lb

## 2014-11-09 DIAGNOSIS — E785 Hyperlipidemia, unspecified: Secondary | ICD-10-CM

## 2014-11-09 DIAGNOSIS — F411 Generalized anxiety disorder: Secondary | ICD-10-CM

## 2014-11-09 DIAGNOSIS — C9590 Leukemia, unspecified not having achieved remission: Secondary | ICD-10-CM

## 2014-11-09 DIAGNOSIS — I2699 Other pulmonary embolism without acute cor pulmonale: Secondary | ICD-10-CM | POA: Insufficient documentation

## 2014-11-09 LAB — LIPID PANEL
Cholesterol: 277 mg/dL — ABNORMAL HIGH (ref 0–200)
HDL: 39.2 mg/dL (ref >39.00)
LDL Cholesterol: 201 mg/dL — ABNORMAL HIGH (ref 0–99)
NonHDL: 237.8
Total CHOL/HDL Ratio: 7
Triglycerides: 186 mg/dL — ABNORMAL HIGH (ref 0.0–149.0)
VLDL: 37.2 mg/dL (ref 0.0–40.0)

## 2014-11-09 LAB — CBC WITH DIFFERENTIAL/PLATELET
Basophils Absolute: 0 10*3/uL (ref 0.0–0.1)
Basophils Relative: 0.4 % (ref 0.0–3.0)
Eosinophils Absolute: 0.4 10*3/uL (ref 0.0–0.7)
Eosinophils Relative: 3.2 % (ref 0.0–5.0)
HCT: 44.9 % (ref 39.0–52.0)
Hemoglobin: 14.8 g/dL (ref 13.0–17.0)
Lymphocytes Relative: 72.5 % — ABNORMAL HIGH (ref 12.0–46.0)
Lymphs Abs: 8.1 10*3/uL — ABNORMAL HIGH (ref 0.7–4.0)
MCHC: 32.9 g/dL (ref 30.0–36.0)
MCV: 93.8 fl (ref 78.0–100.0)
Monocytes Absolute: 0.4 10*3/uL (ref 0.1–1.0)
Monocytes Relative: 3.7 % (ref 3.0–12.0)
Neutro Abs: 2.3 10*3/uL (ref 1.4–7.7)
Neutrophils Relative %: 20.2 % — ABNORMAL LOW (ref 43.0–77.0)
Platelets: 193 10*3/uL (ref 150.0–400.0)
RBC: 4.79 Mil/uL (ref 4.22–5.81)
RDW: 13.4 % (ref 11.5–15.5)
WBC: 11.2 10*3/uL — ABNORMAL HIGH (ref 4.0–10.5)

## 2014-11-09 LAB — FACTOR 5 LEIDEN

## 2014-11-09 LAB — PROTHROMBIN GENE MUTATION

## 2014-11-09 MED ORDER — PAROXETINE HCL 20 MG PO TABS: 20.0000 mg | ORAL_TABLET | Freq: Every day | ORAL | Status: DC

## 2014-11-09 NOTE — Progress Notes (Signed)
Pre visit review using our clinic review tool, if applicable. No additional management support is needed unless otherwise documented below in the visit note. 

## 2014-11-09 NOTE — Progress Notes (Signed)
OFFICE VISIT  11/09/2014   CC:  Chief Complaint  Patient presents with  . Medication Management    wants to switch to coumadin  . Hospitalization Follow-up   HPI:    Patient is a 68 y.o. African-American male who presents for hosp f/u: he was admitted to [facility] 12/23-12/24, 2015 for recurrent PE moderate burden, LE doppler neg for DVT and was put on xarelto. His hypercoag w/u was all NEG.  He had pleuritic-type CP on left side on admission but no SOB or hypoxia.  His pain resolved shortly after admission and he was never SOB.  A pulm nodule was noted on chest CT and this needs to be followed up by pulmonologist as outpatient. Pt has hx of CLL and as per Dr. Gearldine Shown last f/u note from 12/2011, "Indications for treating the CLL include development of anemia, systemic "B. "symptoms, and markedly enlarged lymph nodes/splenomegaly".  Also needs PSA check since he has remote hx of prostate ca.  He says he would rather not take xarelto b/c of insurance push-back and no antidote for bleeding, etc. He has a starter pack to last him until mid January or a bit longer.  He says the paxil 10mg  is better from a side effect standpoint but his anxiety is not well controlled and he wants to increase it to 20mg  qd.   Past Medical History  Diagnosis Date  . Hypertension   . Hyperlipidemia   . PAF (paroxysmal atrial fibrillation)   . Mitral regurgitation   . History of pulmonary embolism 2011; 10/2014    Recurrence off of anticoagulation 10/2014  . Aortic regurgitation 04/2007 surgery    Resolved with AVR at [facility]  . History of prostate cancer 2003    Rad prost  . Anticoagulants causing adverse effect in therapeutic use   . Migraine syndrome   . Cervical spondylosis     ESI by [name] in Ortho  . Chronic lymphocytic leukemia (CLL), B-cell approx 2012    Past Surgical History  Procedure Laterality Date  . Cardiac catheterization    . Penile prosthesis implant    . Prostatectomy  04/2002     for prostate cancer  . Cataract extraction      L 12/06/09   R 09/14/09  . Aortic valve replacement  2011    [place]   . Colonoscopy  01/26/13    tic's, o/w normal.  ([facility])--recall 10 yrs.    Outpatient Prescriptions Prior to Visit  Medication Sig Dispense Refill  . fluticasone (FLONASE) 50 MCG/ACT nasal spray Place 2 sprays into both nostrils daily as needed for allergies or rhinitis.     Marland Kitchen guaiFENesin-dextromethorphan (ROBITUSSIN DM) 100-10 MG/5ML syrup Take 5 mLs by mouth every 4 (four) hours as needed for cough. 118 mL 0  . imipramine (TOFRANIL) 10 MG tablet Take 10 mg by mouth at bedtime as needed (for sleep).    . LORazepam (ATIVAN) 0.5 MG tablet Take 0.5 mg by mouth daily as needed for anxiety.    . methocarbamol (ROBAXIN) 500 MG tablet Take 500 mg by mouth daily as needed for muscle spasms.   0  . metoprolol succinate (TOPROL-XL) 25 MG 24 hr tablet Take 1 tablet (25 mg total) by mouth daily. 30 tablet 6  . Rivaroxaban (XARELTO STARTER PACK) 15 & 20 MG TBPK Take as directed on package: Start with one 15mg  tablet by mouth twice a day with food. On Day 22, switch to one 20mg  tablet once a day with food.  51 each 0  . aspirin 81 MG tablet Take 81 mg by mouth daily.      . meloxicam (MOBIC) 15 MG tablet Take 15 mg by mouth daily.     Marland Kitchen PARoxetine (PAXIL) 10 MG tablet Take 1 tablet (10 mg total) by mouth daily. 30 tablet 0  . Omega-3 Fatty Acids (FISH OIL) 1000 MG CAPS Take 1,000 mg by mouth daily.      No facility-administered medications prior to visit.    Allergies  Allergen Reactions  . Hydrocodone-Acetaminophen Shortness Of Breath  . Oxycodone Hcl Shortness Of Breath  . Telmisartan-Hctz Shortness Of Breath and Palpitations    Dizziness, too  . Ramipril   . Aspirin Other (See Comments)    Can't take high dose  . Atorvastatin Other (See Comments)    myalgia  . Buspirone Hcl Other (See Comments)    headache  . Codeine Phosphate Itching and Nausea Only  . Morphine Sulfate  Itching  . Rabeprazole Other (See Comments)    headache    ROS As per HPI: also, no melena, no nose bleeds, no hematuria, no excessive skin bruising.  PE: Blood pressure 152/93, pulse 71, temperature 97.7 F (36.5 C), temperature source Temporal, resp. rate 18, height 5\' 10"  (1.778 m), weight 201 lb (91.173 kg), SpO2 98 %. Gen: Alert, well appearing.  Patient is oriented to person, place, time, and situation. AFFECT: pleasant, lucid thought and speech. No further exam today.  LABS:  Lab Results  Component Value Date   WBC 11.2* 11/09/2014   HGB 14.8 11/09/2014   HCT 44.9 11/09/2014   MCV 93.8 11/09/2014   PLT 193.0 11/09/2014     Chemistry      Component Value Date/Time   NA 137 11/03/2014 0507   K 3.8 11/03/2014 0507   CL 104 11/03/2014 0507   CO2 26 11/03/2014 0507   BUN 13 11/03/2014 0507   CREATININE 1.13 11/03/2014 0507      Component Value Date/Time   CALCIUM 8.7 11/03/2014 0507   ALKPHOS 62 04/11/2014 0915   AST 32 04/11/2014 0915   ALT 36 04/11/2014 0915   BILITOT 0.8 04/11/2014 0915     Lab Results  Component Value Date   INR 2.4 07/17/2011   INR 2.0 07/05/2011   INR 1.7 06/21/2011   Lab Results  Component Value Date   PSA 0.00* 03/30/2013   PSA 0.00 Repeated and verified X2.* 03/22/2011   PSA * 07/17/2010    0.00 (NOTE) Result repeated and verified. Test Methodology: [employer] PSA    IMPRESSION AND PLAN:  Recurrent pulmonary embolism Needs indefinite oral anticoagulation.  Stop aspirin and avoid NSAIDs (d/c'd Mobic today). He'll finish his xarelto starter pack and then switch to coumadin. When transitioning, we'll start lovenox and coumadin when pt's next xarelto dose is due and we'll do a minimum of 5 day overlap of these meds and then stop lovenox when INR is >2.0. Of note, xarelto affects INR; therefore, initial INR measurements after initiating warfarin may be unreliable.  His CLL may explain his hypercoagulability. Will ask Dr. Benay Spice  to see him again for f/u.  Will also make sure his PSA is still 0. He is getting his pulmonary nodule further evaluated by pulmonologist as an outpatient. He has had a relatively recent colonoscopy that showed no polyps.  At next f/u we'll check PSA and baseline PT/INR.   Anxiety state Increase paxil to 20mg  qd today.   An After Visit Summary was printed and  given to the patient.  FOLLOW UP: Return in about 10 days (around 11/19/2014) for 30 min visit to discuss PE/malig w/u/paxil.

## 2014-11-09 NOTE — Assessment & Plan Note (Addendum)
Needs indefinite oral anticoagulation.  Stop aspirin and avoid NSAIDs (d/c'd Mobic today). He'll finish his xarelto starter pack and then switch to coumadin. When transitioning, we'll start lovenox and coumadin when pt's next xarelto dose is due and we'll do a minimum of 5 day overlap of these meds and then stop lovenox when INR is >2.0. Of note, xarelto affects INR; therefore, initial INR measurements after initiating warfarin may be unreliable.  His CLL may explain his hypercoagulability. Will ask Dr. Benay Spice to see him again for f/u.  Will also make sure his PSA is still 0. He is getting his pulmonary nodule further evaluated by pulmonologist as an outpatient. He has had a relatively recent colonoscopy that showed no polyps.  At next f/u we'll check PSA and baseline PT/INR.

## 2014-11-09 NOTE — Assessment & Plan Note (Signed)
Increase paxil to 20mg  qd today.

## 2014-11-10 ENCOUNTER — Ambulatory Visit (INDEPENDENT_AMBULATORY_CARE_PROVIDER_SITE_OTHER): Payer: 59 | Admitting: Psychology

## 2014-11-10 DIAGNOSIS — F4321 Adjustment disorder with depressed mood: Secondary | ICD-10-CM

## 2014-11-11 ENCOUNTER — Encounter (HOSPITAL_COMMUNITY): Payer: Self-pay | Admitting: Emergency Medicine

## 2014-11-11 ENCOUNTER — Emergency Department (HOSPITAL_COMMUNITY)
Admission: EM | Admit: 2014-11-11 | Discharge: 2014-11-11 | Disposition: A | Discharge status: Home / Self Care | Payer: Medicare Other | Attending: Emergency Medicine | Admitting: Emergency Medicine

## 2014-11-11 DIAGNOSIS — Z7901 Long term (current) use of anticoagulants: Secondary | ICD-10-CM | POA: Insufficient documentation

## 2014-11-11 DIAGNOSIS — Z8546 Personal history of malignant neoplasm of prostate: Secondary | ICD-10-CM | POA: Diagnosis not present

## 2014-11-11 DIAGNOSIS — I10 Essential (primary) hypertension: Secondary | ICD-10-CM | POA: Diagnosis not present

## 2014-11-11 DIAGNOSIS — Z86711 Personal history of pulmonary embolism: Secondary | ICD-10-CM | POA: Diagnosis not present

## 2014-11-11 DIAGNOSIS — Z7951 Long term (current) use of inhaled steroids: Secondary | ICD-10-CM | POA: Insufficient documentation

## 2014-11-11 DIAGNOSIS — Z79899 Other long term (current) drug therapy: Secondary | ICD-10-CM | POA: Diagnosis not present

## 2014-11-11 DIAGNOSIS — M79605 Pain in left leg: Secondary | ICD-10-CM

## 2014-11-11 DIAGNOSIS — Z9889 Other specified postprocedural states: Secondary | ICD-10-CM | POA: Diagnosis not present

## 2014-11-11 DIAGNOSIS — Z8579 Personal history of other malignant neoplasms of lymphoid, hematopoietic and related tissues: Secondary | ICD-10-CM | POA: Insufficient documentation

## 2014-11-11 DIAGNOSIS — Z8669 Personal history of other diseases of the nervous system and sense organs: Secondary | ICD-10-CM | POA: Diagnosis not present

## 2014-11-11 DIAGNOSIS — Z87891 Personal history of nicotine dependence: Secondary | ICD-10-CM | POA: Diagnosis not present

## 2014-11-11 DIAGNOSIS — Z8639 Personal history of other endocrine, nutritional and metabolic disease: Secondary | ICD-10-CM | POA: Insufficient documentation

## 2014-11-11 NOTE — ED Notes (Signed)
Vascular called and sts will not do pt study until tomorrow

## 2014-11-11 NOTE — ED Notes (Signed)
Pt here for left leg pain after having recent hospitalization for PE; pt taking xerelto

## 2014-11-11 NOTE — ED Provider Notes (Signed)
CSN: [id number]     Arrival date & time 11/11/14  1841 History   First MD Initiated Contact with Patient 11/11/14 1932     Chief Complaint  Patient presents with  . Leg Pain     (Consider location/radiation/quality/duration/timing/severity/associated sxs/prior Treatment) Patient is a 69 y.o. male presenting with leg pain. The history is provided by the patient.  Leg Pain Location:  Leg Time since incident:  12 hours Injury: no   Leg location:  L leg Pain details:    Quality:  Cramping   Radiates to:  Does not radiate   Severity:  Mild   Onset quality:  Sudden   Duration:  12 hours   Timing:  Constant   Progression:  Partially resolved Chronicity:  New Relieved by:  None tried Worsened by:  Nothing tried Ineffective treatments:  None tried Associated symptoms: no back pain, no decreased ROM, no fever, no stiffness and no swelling     Past Medical History  Diagnosis Date  . Hypertension   . Hyperlipidemia   . PAF (paroxysmal atrial fibrillation)   . Mitral regurgitation   . History of pulmonary embolism 2011; 10/2014    Recurrence off of anticoagulation 10/2014  . Aortic regurgitation 04/2007 surgery    Resolved with AVR at [facility]  . History of prostate cancer 2003    Rad prost  . Anticoagulants causing adverse effect in therapeutic use   . Migraine syndrome   . Cervical spondylosis     ESI by [name] in Ortho  . Chronic lymphocytic leukemia (CLL), B-cell approx 2012   Past Surgical History  Procedure Laterality Date  . Cardiac catheterization    . Penile prosthesis implant    . Prostatectomy  04/2002    for prostate cancer  . Cataract extraction      L 12/06/09   R 09/14/09  . Aortic valve replacement  2011    [place]   . Colonoscopy  01/26/13    tic's, o/w normal.  ([facility])--recall 10 yrs.   Family History  Problem Relation Age of Onset  . Hyperlipidemia Other   . Hypertension Other   . Cancer      prostate  . Cancer Sister     uterine  . Colon cancer  Sister 70   History  Substance Use Topics  . Smoking status: Former Smoker -- 1.00 packs/day    Types: Cigarettes    Quit date: 01/29/1981  . Smokeless tobacco: Never Used  . Alcohol Use: Yes     Comment: occasional    Review of Systems  Constitutional: Negative for fever, diaphoresis, activity change and appetite change.  HENT: Negative for facial swelling, sore throat, tinnitus, trouble swallowing and voice change.   Eyes: Negative for pain, redness and visual disturbance.  Respiratory: Negative for chest tightness, shortness of breath and wheezing.   Cardiovascular: Negative for chest pain, palpitations and leg swelling.  Gastrointestinal: Negative for nausea, vomiting, abdominal pain, diarrhea, constipation and abdominal distention.  Endocrine: Negative.   Genitourinary: Negative.  Negative for dysuria, decreased urine volume, scrotal swelling and testicular pain.  Musculoskeletal: Positive for myalgias. Negative for back pain, gait problem and stiffness.  Skin: Negative.  Negative for rash.  Neurological: Negative.  Negative for dizziness, tremors, weakness and headaches.  Psychiatric/Behavioral: Negative for suicidal ideas, hallucinations and self-injury. The patient is not nervous/anxious.       Allergies  Hydrocodone-acetaminophen; Oxycodone hcl; Telmisartan-hctz; Ramipril; Aspirin; Atorvastatin; Buspirone hcl; Codeine phosphate; Morphine sulfate; and Rabeprazole  Home  Medications   Prior to Admission medications   Medication Sig Start Date End Date Taking? Authorizing Provider  fluticasone (FLONASE) 50 MCG/ACT nasal spray Place 2 sprays into both nostrils daily as needed for allergies or rhinitis.  07/05/14  Yes Historical Provider, MD  guaiFENesin-dextromethorphan (ROBITUSSIN DM) 100-10 MG/5ML syrup Take 5 mLs by mouth every 4 (four) hours as needed for cough. 11/03/14  Yes Maryann Mikhail, DO  imipramine (TOFRANIL) 10 MG tablet Take 10 mg by mouth at bedtime as needed  (for sleep).   Yes Historical Provider, MD  LORazepam (ATIVAN) 0.5 MG tablet Take 0.5 mg by mouth daily as needed for anxiety.   Yes Historical Provider, MD  methocarbamol (ROBAXIN) 500 MG tablet Take 500 mg by mouth daily as needed for muscle spasms.  10/19/14  Yes Historical Provider, MD  metoprolol succinate (TOPROL-XL) 25 MG 24 hr tablet Take 1 tablet (25 mg total) by mouth daily. 06/08/14  Yes Josue Hector, MD  Omega-3 Fatty Acids (FISH OIL) 1000 MG CAPS Take 1,000 mg by mouth daily.    Yes Historical Provider, MD  PARoxetine (PAXIL) 20 MG tablet Take 1 tablet (20 mg total) by mouth daily. 11/09/14  Yes [name], MD  rivaroxaban (XARELTO) 10 MG TABS tablet Take 10 mg by mouth daily.   Yes Historical Provider, MD  Rivaroxaban (XARELTO STARTER PACK) 15 & 20 MG TBPK Take as directed on package: Start with one 15mg  tablet by mouth twice a day with food. On Day 22, switch to one 20mg  tablet once a day with food. Patient not taking: Reported on 11/11/2014 11/03/14   Maryann Mikhail, DO   BP 155/80 mmHg  Pulse 87  Temp(Src) 98.2 F (36.8 C) (Oral)  Resp 21  SpO2 98% Physical Exam  Constitutional: He is oriented to person, place, and time. He appears well-developed and well-nourished. No distress.  HENT:  Head: Normocephalic and atraumatic.  Right Ear: External ear normal.  Left Ear: External ear normal.  Nose: Nose normal.  Mouth/Throat: Oropharynx is clear and moist.  Eyes: Conjunctivae and EOM are normal. Pupils are equal, round, and reactive to light. No scleral icterus.  Neck: Normal range of motion. Neck supple. No JVD present. No tracheal deviation present. No thyromegaly present.  Cardiovascular: Normal rate and intact distal pulses.  Exam reveals no gallop and no friction rub.   No murmur heard. 2+ DP and PT pulses equal bilaterally  Pulmonary/Chest: Effort normal and breath sounds normal. No stridor. No respiratory distress. He has no wheezes. He has no rales.  Abdominal:  Soft. He exhibits no distension. There is no tenderness. There is no rebound and no guarding.  Musculoskeletal: Normal range of motion. He exhibits no edema or tenderness (No swelling or tenderness to left leg. Patient states leg cramping is unaffected by palpation).  Neurological: He is alert and oriented to person, place, and time. No cranial nerve deficit. He exhibits normal muscle tone. Coordination normal.  5/5 strength in all 4 extremities. Normal Gait.   Skin: Skin is warm and dry. No rash noted. He is not diaphoretic.  Psychiatric: He has a normal mood and affect. His behavior is normal.  Nursing note and vitals reviewed.   ED Course  Procedures (including critical care time) Labs Review Labs Reviewed - No data to display  Imaging Review No results found.   EKG Interpretation None      MDM   Final diagnoses:  Left leg pain    The patient is a  69 y.o. Male with recent hospitalization for PE from 12/23-12/24 currently treated with xarelto for anticoagulation. Patient presents for cramping of left leg since he woke up this morning. Denies any SOB, chest pain, and states these symptoms are "totally different" than his previous PE. Leg is not red or swollen and is NVI and patient is already anticoagulated. Do not feel patient has PE or other acute pathology requiring admission. Patient still taking xarelto so no need to give lovenox. Patinet d/c'ed with doppler US scheduled for tomorrow. Patient expresses understanding and agreement with this plan.  Patient seen with attending, Dr. Alvino Chapel, who oversaw clinical decision making.     Margaretann Loveless, MD 11/11/14 [place] Alvino Chapel, MD 11/14/14 [phone]

## 2014-11-12 ENCOUNTER — Ambulatory Visit (HOSPITAL_COMMUNITY)
Admission: RE | Admit: 2014-11-12 | Discharge: 2014-11-12 | Disposition: A | Discharge status: Home / Self Care | Payer: Medicare Other | Source: Ambulatory Visit | Attending: Emergency Medicine | Admitting: Emergency Medicine

## 2014-11-12 DIAGNOSIS — R252 Cramp and spasm: Secondary | ICD-10-CM | POA: Insufficient documentation

## 2014-11-12 DIAGNOSIS — Z7901 Long term (current) use of anticoagulants: Secondary | ICD-10-CM | POA: Insufficient documentation

## 2014-11-12 DIAGNOSIS — M79605 Pain in left leg: Secondary | ICD-10-CM | POA: Diagnosis not present

## 2014-11-12 DIAGNOSIS — Z86711 Personal history of pulmonary embolism: Secondary | ICD-10-CM | POA: Diagnosis not present

## 2014-11-12 DIAGNOSIS — I2699 Other pulmonary embolism without acute cor pulmonale: Secondary | ICD-10-CM

## 2014-11-12 DIAGNOSIS — M79609 Pain in unspecified limb: Secondary | ICD-10-CM

## 2014-11-12 NOTE — Progress Notes (Signed)
VASCULAR LAB PRELIMINARY  PRELIMINARY  PRELIMINARY  PRELIMINARY  Left lower extremity venous duplex completed.    Preliminary report:  Left:  No evidence of DVT, superficial thrombosis, or Baker's cyst.  Ophia Shamoon, RVS 11/12/2014, 12:20 PM

## 2014-11-16 ENCOUNTER — Other Ambulatory Visit: Payer: Self-pay | Admitting: Family Medicine

## 2014-11-16 ENCOUNTER — Encounter: Payer: Self-pay | Admitting: Family Medicine

## 2014-11-16 ENCOUNTER — Telehealth: Payer: Self-pay | Admitting: *Deleted

## 2014-11-16 ENCOUNTER — Ambulatory Visit (INDEPENDENT_AMBULATORY_CARE_PROVIDER_SITE_OTHER): Payer: 59 | Admitting: Family Medicine

## 2014-11-16 VITALS — BP 119/82 | HR 77 | Temp 97.2°F | Resp 16 | Ht 70.0 in | Wt 201.0 lb

## 2014-11-16 DIAGNOSIS — G4700 Insomnia, unspecified: Secondary | ICD-10-CM

## 2014-11-16 DIAGNOSIS — C9110 Chronic lymphocytic leukemia of B-cell type not having achieved remission: Secondary | ICD-10-CM

## 2014-11-16 DIAGNOSIS — I2699 Other pulmonary embolism without acute cor pulmonale: Secondary | ICD-10-CM

## 2014-11-16 DIAGNOSIS — E785 Hyperlipidemia, unspecified: Secondary | ICD-10-CM

## 2014-11-16 DIAGNOSIS — F411 Generalized anxiety disorder: Secondary | ICD-10-CM

## 2014-11-16 DIAGNOSIS — Z8546 Personal history of malignant neoplasm of prostate: Secondary | ICD-10-CM | POA: Diagnosis not present

## 2014-11-16 LAB — PROTIME-INR
INR: 1.5 ratio — ABNORMAL HIGH (ref 0.8–1.0)
Prothrombin Time: 16.5 s — ABNORMAL HIGH (ref 9.6–13.1)

## 2014-11-16 LAB — PSA, MEDICARE: PSA: 0.01 ng/ml — ABNORMAL LOW (ref 0.10–4.00)

## 2014-11-16 MED ORDER — IMIPRAMINE HCL 10 MG PO TABS: ORAL_TABLET | ORAL | Status: DC

## 2014-11-16 NOTE — Progress Notes (Signed)
OFFICE VISIT  11/16/2014   CC:  F/u recurrent PE, hyperlipidemia, anxiety, insomnia  HPI:    Patient is a 69 y.o. African-American male who presents for f/u GAD with panic. Recently increased paxil to 20mg  qd.  Still waiting on this to be of significant help. He did have to go to the ED since our last visit b/c of concern of leg pain, had LE venous doppler that was neg for DVT.  He has no pain currently.   No SOB or CP. Denies bruising or bleeding.  Insomnia is a problem: he is open to doubling his imiprimine.  We had a long discussion today regarding the pros and cons of coumadin vs DOAC.  Past Medical History  Diagnosis Date  . Hypertension   . Hyperlipidemia   . PAF (paroxysmal atrial fibrillation)   . Mitral regurgitation   . History of pulmonary embolism 2011; 10/2014    Recurrence off of anticoagulation 10/2014  . Aortic regurgitation 04/2007 surgery    Resolved with AVR at [facility]  . History of prostate cancer 2003    Rad prost  . Anticoagulants causing adverse effect in therapeutic use   . Migraine syndrome   . Cervical spondylosis     ESI by [name] in Ortho  . Chronic lymphocytic leukemia (CLL), B-cell approx 2012    Past Surgical History  Procedure Laterality Date  . Cardiac catheterization    . Penile prosthesis implant    . Prostatectomy  04/2002    for prostate cancer  . Cataract extraction      L 12/06/09   R 09/14/09  . Aortic valve replacement  2011    [place]   . Colonoscopy  01/26/13    tic's, o/w normal.  ([facility])--recall 10 yrs.    Outpatient Prescriptions Prior to Visit  Medication Sig Dispense Refill  . fluticasone (FLONASE) 50 MCG/ACT nasal spray Place 2 sprays into both nostrils daily as needed for allergies or rhinitis.     Marland Kitchen guaiFENesin-dextromethorphan (ROBITUSSIN DM) 100-10 MG/5ML syrup Take 5 mLs by mouth every 4 (four) hours as needed for cough. 118 mL 0  . LORazepam (ATIVAN) 0.5 MG tablet Take 0.5 mg by mouth daily as needed for anxiety.     . methocarbamol (ROBAXIN) 500 MG tablet Take 500 mg by mouth daily as needed for muscle spasms.   0  . metoprolol succinate (TOPROL-XL) 25 MG 24 hr tablet Take 1 tablet (25 mg total) by mouth daily. 30 tablet 6  . Omega-3 Fatty Acids (FISH OIL) 1000 MG CAPS Take 1,000 mg by mouth daily.     Marland Kitchen PARoxetine (PAXIL) 20 MG tablet Take 1 tablet (20 mg total) by mouth daily. 30 tablet 1  . Rivaroxaban (XARELTO STARTER PACK) 15 & 20 MG TBPK Take as directed on package: Start with one 15mg  tablet by mouth twice a day with food. On Day 22, switch to one 20mg  tablet once a day with food. 51 each 0  . rivaroxaban (XARELTO) 10 MG TABS tablet Take 10 mg by mouth daily.    Marland Kitchen imipramine (TOFRANIL) 10 MG tablet Take 10 mg by mouth at bedtime as needed (for sleep).     No facility-administered medications prior to visit.    Allergies  Allergen Reactions  . Hydrocodone-Acetaminophen Shortness Of Breath  . Oxycodone Hcl Shortness Of Breath  . Telmisartan-Hctz Shortness Of Breath and Palpitations    Dizziness, too  . Ramipril   . Aspirin Other (See Comments)    Can't  take high dose  . Atorvastatin Other (See Comments)    myalgia  . Buspirone Hcl Other (See Comments)    headache  . Codeine Phosphate Itching and Nausea Only  . Morphine Sulfate Itching  . Rabeprazole Other (See Comments)    headache    ROS As per HPI  PE: Blood pressure 119/82, pulse 77, temperature 97.2 F (36.2 C), temperature source Temporal, resp. rate 16, height 5\' 10"  (1.778 m), weight 201 lb (91.173 kg), SpO2 96 %. Gen: Alert, well appearing.  Patient is oriented to person, place, time, and situation. AFFECT: pleasant, lucid thought and speech. No further exam today.  LABS:    Chemistry      Component Value Date/Time   NA 137 11/03/2014 0507   K 3.8 11/03/2014 0507   CL 104 11/03/2014 0507   CO2 26 11/03/2014 0507   BUN 13 11/03/2014 0507   CREATININE 1.13 11/03/2014 0507      Component Value Date/Time   CALCIUM  8.7 11/03/2014 0507   ALKPHOS 62 04/11/2014 0915   AST 32 04/11/2014 0915   ALT 36 04/11/2014 0915   BILITOT 0.8 04/11/2014 0915       IMPRESSION AND PLAN:  1) Recurrent PE: pt has elected to continue xarelto for now but will let me know if he wants to switch over to coumadin. PT/INR baseline drawn today. PSA done today. Hypercoag w/u all neg/normal. Needs f/u with Dr. Benay Spice in Hem/Onc for remote hx of CLL, recent CBC stable.  2) Hyperlipidemia: statin intolerant. He wants to try diet/exercise.  He is likely not ever going to try another statin but I'll keep giving him this option from time to time.    3) GAD with panic: not ideally controlled.  Just recently increased paxil to 20 mg qd 6 days ago and we need to give this more time.    4) Insomnia: increase imiprimine to 20mg  qhs, also switch timing of paxil dosing to hs.  Spent 25 min with pt today, with >50% of this time spent in counseling and care coordination regarding the above problems.  An After Visit Summary was printed and given to the patient.  FOLLOW UP: Return in about 2 months (around 01/15/2015) for routine chronic illness f/u.

## 2014-11-16 NOTE — Progress Notes (Signed)
Pre visit review using our clinic review tool, if applicable. No additional management support is needed unless otherwise documented below in the visit note. 

## 2014-11-16 NOTE — Telephone Encounter (Signed)
Call from [facility] with [facility] asking if pt will need a referral to see Dr. Benay Spice again? It is not clear if pt is having new symptoms. Recent CBC stable. Last visit 12/2011: YES, pt will need referral back to office.

## 2014-11-17 ENCOUNTER — Telehealth: Payer: Self-pay | Admitting: *Deleted

## 2014-11-17 NOTE — Telephone Encounter (Signed)
PA for Xarelto Starter Pack approved. KM#[id number]. Pharmacy notified.

## 2014-11-19 ENCOUNTER — Ambulatory Visit (INDEPENDENT_AMBULATORY_CARE_PROVIDER_SITE_OTHER): Payer: Medicare Other | Admitting: Internal Medicine

## 2014-11-19 ENCOUNTER — Encounter: Payer: Self-pay | Admitting: Internal Medicine

## 2014-11-19 VITALS — BP 118/74 | HR 94 | Temp 97.6°F | Wt 204.0 lb

## 2014-11-19 DIAGNOSIS — R040 Epistaxis: Secondary | ICD-10-CM

## 2014-11-19 NOTE — Progress Notes (Signed)
Pre visit review using our clinic review tool, if applicable. No additional management support is needed unless otherwise documented below in the visit note. 

## 2014-11-19 NOTE — Patient Instructions (Signed)
Use Vaseline in the nose twice a day to keep the area moist If you have mucus, use saline solution to clear the nose. If you bleed again, apply Afrin 2-3.drops on each side, use cotton  and apply pressure to the area. If you don't stop bleeding within 10 or 15 minutes, call or go to the ER If you have severe bleeding go to the ER. Discuss his secretions week with your primary doctor.    Nosebleed Nosebleeds can be caused by many conditions, including trauma, infections, polyps, foreign bodies, dry mucous membranes or climate, medicines, and air conditioning. Most nosebleeds occur in the front of the nose. Because of this location, most nosebleeds can be controlled by pinching the nostrils gently and continuously for at least 10 to 20 minutes. The long, continuous pressure allows enough time for the blood to clot. If pressure is released during that 10 to 20 minute time period, the process may have to be started again. The nosebleed may stop by itself or quit with pressure, or it may need concentrated heating (cautery) or pressure from packing. HOME CARE INSTRUCTIONS   If your nose was packed, try to maintain the pack inside until your health care provider removes it. If a gauze pack was used and it starts to fall out, gently replace it or cut the end off. Do not cut if a balloon catheter was used to pack the nose. Otherwise, do not remove unless instructed.  Avoid blowing your nose for 12 hours after treatment. This could dislodge the pack or clot and start the bleeding again.  If the bleeding starts again, sit up and bend forward, gently pinching the front half of your nose continuously for 20 minutes.  If bleeding was caused by dry mucous membranes, use over-the-counter saline nasal spray or gel. This will keep the mucous membranes moist and allow them to heal. If you must use a lubricant, choose the water-soluble variety. Use it only sparingly and not within several hours of lying down.  Do  not use petroleum jelly or mineral oil, as these may drip into the lungs and cause serious problems.  Maintain humidity in your home by using less air conditioning or by using a humidifier.  Do not use aspirin or medicines which make bleeding more likely. Your health care provider can give you recommendations on this.  Resume normal activities as you are able, but try to avoid straining, lifting, or bending at the waist for several days.  If the nosebleeds become recurrent and the cause is unknown, your health care provider may suggest laboratory tests. SEEK MEDICAL CARE IF: You have a fever. SEEK IMMEDIATE MEDICAL CARE IF:   Bleeding recurs and cannot be controlled.  There is unusual bleeding from or bruising on other parts of the body.  Nosebleeds continue.  There is any worsening of the condition which originally brought you in.  You become light-headed, feel faint, become sweaty, or vomit blood. MAKE SURE YOU:   Understand these instructions.  Will watch your condition.  Will get help right away if you are not doing well or get worse. Document Released: 08/07/2005 Document Revised: 03/14/2014 Document Reviewed: 09/28/2009 [facility] Patient Information 2015 [place], [place]. This information is not intended to replace advice given to you by your health care provider. Make sure you discuss any questions you have with your health care provider.

## 2014-11-19 NOTE — Progress Notes (Signed)
Subjective:    Patient ID: [name], male    DOB: Jan 28, 1946, 69 y.o.   MRN: [id number]  DOS:  11/19/2014 Type of visit - description : acute, [facility] Interval history: The patient was started on Xarelto for a PE last month, aspirin and fish oil  were discontinued. Had very mild nosebleed few days ago and this morning. This morning he noted clotted blood in the nose and in the throat, hard to say the amount to him but apparently  wasn't   a lot.    ROS Denies runny nose, sore throat, fever, chills or cough. No nausea, vomiting, diarrhea or blood in the stools. No blood in the urine  Past Medical History  Diagnosis Date  . Hypertension   . Hyperlipidemia   . PAF (paroxysmal atrial fibrillation)   . Mitral regurgitation   . History of pulmonary embolism 2011; 10/2014    Recurrence off of anticoagulation 10/2014  . Aortic regurgitation 04/2007 surgery    Resolved with AVR at [facility]  . History of prostate cancer 2003    Rad prost  . Anticoagulants causing adverse effect in therapeutic use   . Migraine syndrome   . Cervical spondylosis     ESI by [name] in Ortho  . Chronic lymphocytic leukemia (CLL), B-cell approx 2012    Past Surgical History  Procedure Laterality Date  . Cardiac catheterization    . Penile prosthesis implant    . Prostatectomy  04/2002    for prostate cancer  . Cataract extraction      L 12/06/09   R 09/14/09  . Aortic valve replacement  2011    [place]   . Colonoscopy  01/26/13    tic's, o/w normal.  ([facility])--recall 10 yrs.    History   Social History  . Marital Status: Single    Spouse Name: N/A    Number of Children: N/A  . Years of Education: N/A   Occupational History  . Not on file.   Social History Main Topics  . Smoking status: Former Smoker -- 1.00 packs/day    Types: Cigarettes    Quit date: 01/29/1981  . Smokeless tobacco: Never Used  . Alcohol Use: Yes     Comment: occasional  . Drug Use: No  . Sexual Activity: Not  on file   Other Topics Concern  . Not on file   Social History Narrative        Medication List       This list is accurate as of: 11/19/14  9:46 AM.  Always use your most recent med list.               Fish Oil 1000 MG Caps  Take 1,000 mg by mouth daily.     fluticasone 50 MCG/ACT nasal spray  Commonly known as:  FLONASE  Place 2 sprays into both nostrils daily as needed for allergies or rhinitis.     guaiFENesin-dextromethorphan 100-10 MG/5ML syrup  Commonly known as:  ROBITUSSIN DM  Take 5 mLs by mouth every 4 (four) hours as needed for cough.     imipramine 10 MG tablet  Commonly known as:  TOFRANIL  2 tabs po qhs for insomnia     LORazepam 0.5 MG tablet  Commonly known as:  ATIVAN  Take 0.5 mg by mouth daily as needed for anxiety.     methocarbamol 500 MG tablet  Commonly known as:  ROBAXIN  Take 500 mg by mouth daily as  needed for muscle spasms.     metoprolol succinate 25 MG 24 hr tablet  Commonly known as:  TOPROL-XL  Take 1 tablet (25 mg total) by mouth daily.     PARoxetine 10 MG tablet  Commonly known as:  PAXIL     PARoxetine 20 MG tablet  Commonly known as:  PAXIL  Take 1 tablet (20 mg total) by mouth daily.     rivaroxaban 10 MG Tabs tablet  Commonly known as:  XARELTO  Take 10 mg by mouth daily.     Rivaroxaban 15 & 20 MG Tbpk  Commonly known as:  XARELTO STARTER PACK  Take as directed on package: Start with one 15mg  tablet by mouth twice a day with food. On Day 22, switch to one 20mg  tablet once a day with food.           Objective:   Physical Exam BP 118/74 mmHg  Pulse 94  Temp(Src) 97.6 F (36.4 C) (Oral)  Wt 204 lb (92.534 kg)  SpO2 94% General -- alert, well-developed, NAD.   HEENT-- Not pale.  R Ear-- normal L ear-- normal Throat symmetric, no redness or discharge. Face symmetric, sinuses not tender to palpation. Nose not congested. Inspection of the nasal cavities without active bleeding, some dry mucus. No dry  blood Lungs -- normal respiratory effort, no intercostal retractions, no accessory muscle use, and normal breath sounds.  Heart-- normal rate, regular rhythm, no murmur.   Psych-- Cognition and judgment appear intact. Cooperative with normal attention span and concentration. No anxious or depressed appearing.        Assessment & Plan:   Nosebleeds, Had 2 episodes of nosebleed this week, started xarelto  last month. Currently with no active bleeding, on clinical grounds, these were no major bleeds. Stools and urine remain free of blood. Recommend to continue anticoagulation as he had a PE last month Treat nosebleed with Afrin, pressure, ER if bleeding persist or symptoms severe. Discuss with PCP the situation next week, pt is  thinking about request to change to Coumadin which he has taken before without problems.

## 2014-11-21 ENCOUNTER — Telehealth: Payer: Self-pay | Admitting: Hematology and Oncology

## 2014-11-21 NOTE — Telephone Encounter (Signed)
left message for patient to return call to schedule np appt.  °

## 2014-11-24 ENCOUNTER — Other Ambulatory Visit: Payer: Self-pay | Admitting: Internal Medicine

## 2014-11-24 ENCOUNTER — Ambulatory Visit (INDEPENDENT_AMBULATORY_CARE_PROVIDER_SITE_OTHER): Payer: 59 | Admitting: Psychology

## 2014-11-24 DIAGNOSIS — F4321 Adjustment disorder with depressed mood: Secondary | ICD-10-CM

## 2014-11-25 ENCOUNTER — Telehealth: Payer: Self-pay | Admitting: Hematology & Oncology

## 2014-11-25 NOTE — Telephone Encounter (Signed)
I called and left a message on patient's voice mail with New Patient apt date/time.  I also instructed patient to bring updated insurance and medication bottles.

## 2014-11-28 ENCOUNTER — Other Ambulatory Visit: Payer: Self-pay | Admitting: Internal Medicine

## 2014-11-28 ENCOUNTER — Ambulatory Visit (HOSPITAL_BASED_OUTPATIENT_CLINIC_OR_DEPARTMENT_OTHER): Payer: 59 | Admitting: Family

## 2014-11-28 ENCOUNTER — Ambulatory Visit: Payer: 59

## 2014-11-28 ENCOUNTER — Encounter: Payer: Self-pay | Admitting: Family

## 2014-11-28 ENCOUNTER — Other Ambulatory Visit (HOSPITAL_BASED_OUTPATIENT_CLINIC_OR_DEPARTMENT_OTHER): Payer: 59 | Admitting: Lab

## 2014-11-28 ENCOUNTER — Telehealth: Payer: Self-pay | Admitting: Family Medicine

## 2014-11-28 ENCOUNTER — Telehealth: Payer: Self-pay | Admitting: *Deleted

## 2014-11-28 DIAGNOSIS — Z856 Personal history of leukemia: Secondary | ICD-10-CM

## 2014-11-28 DIAGNOSIS — R911 Solitary pulmonary nodule: Secondary | ICD-10-CM

## 2014-11-28 DIAGNOSIS — I2699 Other pulmonary embolism without acute cor pulmonale: Secondary | ICD-10-CM

## 2014-11-28 LAB — CBC WITH DIFFERENTIAL (CANCER CENTER ONLY)
BASO#: 0.1 10*3/uL (ref 0.0–0.2)
BASO%: 0.4 % (ref 0.0–2.0)
EOS%: 1.8 % (ref 0.0–7.0)
Eosinophils Absolute: 0.2 10*3/uL (ref 0.0–0.5)
HCT: 43.9 % (ref 38.7–49.9)
HGB: 14.9 g/dL (ref 13.0–17.1)
LYMPH#: 10.5 10*3/uL — ABNORMAL HIGH (ref 0.9–3.3)
LYMPH%: 79.2 % — ABNORMAL HIGH (ref 14.0–48.0)
MCH: 31.6 pg (ref 28.0–33.4)
MCHC: 33.9 g/dL (ref 32.0–35.9)
MCV: 93 fL (ref 82–98)
MONO#: 0.4 10*3/uL (ref 0.1–0.9)
MONO%: 2.7 % (ref 0.0–13.0)
NEUT#: 2.1 10*3/uL (ref 1.5–6.5)
NEUT%: 15.9 % — ABNORMAL LOW (ref 40.0–80.0)
Platelets: 157 10*3/uL (ref 145–400)
RBC: 4.72 10*6/uL (ref 4.20–5.70)
RDW: 12.8 % (ref 11.1–15.7)
WBC: 13.3 10*3/uL — ABNORMAL HIGH (ref 4.0–10.0)

## 2014-11-28 LAB — CMP (CANCER CENTER ONLY)
ALT(SGPT): 44 U/L (ref 10–47)
AST: 39 U/L — ABNORMAL HIGH (ref 11–38)
Albumin: 3.7 g/dL (ref 3.3–5.5)
Alkaline Phosphatase: 56 U/L (ref 26–84)
BUN, Bld: 18 mg/dL (ref 7–22)
CO2: 30 mEq/L (ref 18–33)
Calcium: 9.5 mg/dL (ref 8.0–10.3)
Chloride: 100 mEq/L (ref 98–108)
Creat: 1.4 mg/dl — ABNORMAL HIGH (ref 0.6–1.2)
Glucose, Bld: 97 mg/dL (ref 73–118)
Potassium: 4.8 mEq/L — ABNORMAL HIGH (ref 3.3–4.7)
Sodium: 140 mEq/L (ref 128–145)
Total Bilirubin: 0.6 mg/dl (ref 0.20–1.60)
Total Protein: 7.4 g/dL (ref 6.4–8.1)

## 2014-11-28 LAB — LACTATE DEHYDROGENASE: LDH: 298 U/L — ABNORMAL HIGH (ref 94–250)

## 2014-11-28 LAB — TECHNOLOGIST REVIEW CHCC SATELLITE

## 2014-11-28 NOTE — Telephone Encounter (Signed)
[facility] called and stated that a RX that was sent in for [name] Schulke needs prior authorization. They did however give the pt 3 pills to carry him over. Please be sure to do reill back to the 18th to cover his 3 pills.   PT has Medicare and the number is [phone]; Grp is COS.  Please call [facility] at [phone].Shelby Mattocks

## 2014-11-28 NOTE — Progress Notes (Signed)
Hematology/Oncology Consultation   Name: [name]      MRN: [id number]    Location: Room/bed info not found  Date: 11/28/2014 Time:2:12 PM   REFERRING PHYSICIAN:  Adrian Blackwater McGowen  REASON FOR CONSULT:  Remote history of CLL and 1.4 x 1.0 cm pulmonary nodule in right lower lobe.    DIAGNOSIS: 1. Pulmonary nodule of right lower lobe 2. Remote history of CLL, B-cell - 2012 3. Bilateral PE's (being treated with Xarelto-started in December 2015)  HISTORY OF PRESENT ILLNESS:  [name] is a very pleasant 69 yo male with a remote history of CLL  and a recent CT angio of the chest revealed a small pulmonary nodule in the right lower lobe. He was having severe SOB at the time. He was also found to have PE's in both lungs. He was started on a Xarelto dose pack in December and is now taking 20 mg daily.  Venous duplex of the left arm in November 2015 showed chronic superficial thrombus noted in the left mid basilic and cephalic veins. He had PE's and also a DVT in the right leg in 2011. This happened after he had a valve replacement. He was treated with coumadin at that time.  He does a lot of travelling by plane. He flew a total of 47 hours for a trip in October. He also took a trip to [facility] in December.  He also has a history of prostate cancer and had a prostatectomy in 2003.  His last colonoscopy was in 2014 and was negative for malignancy.  He does not smoke and is only a social drinker.  Cancer history in his family includes his father and brother having prostate cancer and his sister having uterine cancer.  No anemia or sickle cell in the family. No other family members with history of blood clots. He has had no problems with infection. His SOB is much improved.  He denies fever, chills, n/v, cough, rash, headache, dizziness, chest pain, palpitations, abdominal pain, constipation, diarrhea, blood in urine or stool.  No swelling, tenderness, numbness or tingling in his extremities.  His  appetite is good and he is staying hydrated. His weight is stable at 202 lbs.  He does have problems with anxiety and is on Ativan and Paxil. He is understandably nervous for his appointment today.  He worked for [employer] in the office. He was exposed to a lot of second hand smoke there as they were allowed to smoke in the office.   ROS: All other 10 point review of systems is negative.   PAST MEDICAL HISTORY:   Past Medical History  Diagnosis Date  . Hypertension   . Hyperlipidemia   . PAF (paroxysmal atrial fibrillation)   . Mitral regurgitation   . History of pulmonary embolism 2011; 10/2014    Recurrence off of anticoagulation 10/2014  . Aortic regurgitation 04/2007 surgery    Resolved with AVR at [facility]  . History of prostate cancer 2003    Rad prost  . Anticoagulants causing adverse effect in therapeutic use   . Migraine syndrome   . Cervical spondylosis     ESI by [name] in Ortho  . Chronic lymphocytic leukemia (CLL), B-cell approx 2012    ALLERGIES: Allergies  Allergen Reactions  . Hydrocodone-Acetaminophen Shortness Of Breath  . Oxycodone Hcl Shortness Of Breath  . Telmisartan-Hctz Shortness Of Breath and Palpitations    Dizziness, too  . Ramipril   . Aspirin Other (See Comments)  Can't take high dose  . Atorvastatin Other (See Comments)    myalgia  . Buspirone Hcl Other (See Comments)    headache  . Codeine Phosphate Itching and Nausea Only  . Morphine Sulfate Itching  . Rabeprazole Other (See Comments)    headache      MEDICATIONS:  Current Outpatient Prescriptions on File Prior to Visit  Medication Sig Dispense Refill  . imipramine (TOFRANIL) 10 MG tablet 2 tabs po qhs for insomnia 60 tablet 11  . LORazepam (ATIVAN) 0.5 MG tablet Take 0.5 mg by mouth daily as needed for anxiety.    . metoprolol succinate (TOPROL-XL) 25 MG 24 hr tablet Take 1 tablet (25 mg total) by mouth daily. 30 tablet 6  . PARoxetine (PAXIL) 20 MG tablet Take 1 tablet (20  mg total) by mouth daily. 30 tablet 1  . rivaroxaban (XARELTO) 20 MG TABS tablet Take 1 tablet (20 mg total) by mouth daily with supper. 30 tablet 6   No current facility-administered medications on file prior to visit.     PAST SURGICAL HISTORY Past Surgical History  Procedure Laterality Date  . Cardiac catheterization    . Penile prosthesis implant    . Prostatectomy  04/2002    for prostate cancer  . Cataract extraction      L 12/06/09   R 09/14/09  . Aortic valve replacement  2011    [place]   . Colonoscopy  01/26/13    tic's, o/w normal.  ([facility])--recall 10 yrs.    FAMILY HISTORY: Family History  Problem Relation Age of Onset  . Hyperlipidemia Other   . Hypertension Other   . Cancer      prostate  . Cancer Sister     uterine  . Colon cancer Sister 65    SOCIAL HISTORY:  reports that he quit smoking about 33 years ago. His smoking use included Cigarettes. He started smoking about 46 years ago. He has a 12 pack-year smoking history. He has never used smokeless tobacco. He reports that he drinks alcohol. He reports that he does not use illicit drugs.  PERFORMANCE STATUS: The patient's performance status is 1 - Symptomatic but completely ambulatory  PHYSICAL EXAM: Most Recent Vital Signs: Blood pressure 129/82, pulse 57, temperature 98 F (36.7 C), temperature source Oral, resp. rate 18, height 5\' 10"  (1.778 m), weight 202 lb (91.627 kg). BP 129/82 mmHg  Pulse 57  Temp(Src) 98 F (36.7 C) (Oral)  Resp 18  Ht 5\' 10"  (1.778 m)  Wt 202 lb (91.627 kg)  BMI 28.98 kg/m2  General Appearance:    Alert, cooperative, no distress, appears stated age  Head:    Normocephalic, without obvious abnormality, atraumatic  Eyes:    PERRL, conjunctiva/corneas clear, EOM's intact, fundi    benign, both eyes             Throat:   Lips, mucosa, and tongue normal; teeth and gums normal  Neck:   Supple, symmetrical, trachea midline, no adenopathy;       thyroid:  No  enlargement/tenderness/nodules; no carotid   bruit or JVD  Back:     Symmetric, no curvature, ROM normal, no CVA tenderness  Lungs:     Clear to auscultation bilaterally, respirations unlabored  Chest wall:    No tenderness or deformity  Heart:    Regular rate and rhythm, S1 and S2 normal, no murmur, rub   or gallop  Abdomen:     Soft, non-tender, bowel sounds active all four  quadrants,    no masses, no organomegaly        Extremities:   Extremities normal, atraumatic, no cyanosis or edema  Pulses:   2+ and symmetric all extremities  Skin:   Skin color, texture, turgor normal, no rashes or lesions  Lymph nodes:   Cervical, supraclavicular, and axillary nodes normal  Neurologic:   CNII-XII intact. Normal strength, sensation and reflexes      throughout    LABORATORY DATA:  Results for orders placed or performed in visit on 11/28/14 (from the past 48 hour(s))  CBC with Differential [facility])     Status: Abnormal   Collection Time: 11/28/14  1:16 PM  Result Value Ref Range   WBC 13.3 (H) 4.0 - 10.0 10e3/uL   RBC 4.72 4.20 - 5.70 10e6/uL   HGB 14.9 13.0 - 17.1 g/dL   HCT 43.9 38.7 - 49.9 %   MCV 93 82 - 98 fL   MCH 31.6 28.0 - 33.4 pg   MCHC 33.9 32.0 - 35.9 g/dL   RDW 12.8 11.1 - 15.7 %   Platelets 157 145 - 400 10e3/uL   NEUT# 2.1 1.5 - 6.5 10e3/uL   LYMPH# 10.5 (H) 0.9 - 3.3 10e3/uL   MONO# 0.4 0.1 - 0.9 10e3/uL   Eosinophils Absolute 0.2 0.0 - 0.5 10e3/uL   BASO# 0.1 0.0 - 0.2 10e3/uL   NEUT% 15.9 (L) 40.0 - 80.0 %   LYMPH% 79.2 (H) 14.0 - 48.0 %   MONO% 2.7 0.0 - 13.0 %   EOS% 1.8 0.0 - 7.0 %   BASO% 0.4 0.0 - 2.0 %  COMPREHENSIVE METABOLIC PANEL ([facility] REFLEX ONLY)     Status: Abnormal   Collection Time: 11/28/14  1:16 PM  Result Value Ref Range   Sodium 140 128 - 145 mEq/L   Potassium 4.8 (H) 3.3 - 4.7 mEq/L   Chloride 100 98 - 108 mEq/L   CO2 30 18 - 33 mEq/L   Glucose, Bld 97 73 - 118 mg/dL   BUN, Bld 18 7 - 22 mg/dL   Creat 1.4 (H) 0.6 - 1.2 mg/dl    Total Bilirubin 0.60 0.20 - 1.60 mg/dl   Alkaline Phosphatase 56 26 - 84 U/L   AST 39 (H) 11 - 38 U/L   ALT(SGPT) 44 10 - 47 U/L   Total Protein 7.4 6.4 - 8.1 g/dL   Albumin 3.7 3.3 - 5.5 g/dL   Calcium 9.5 8.0 - 10.3 mg/dL  TECHNOLOGIST REVIEW [facility]     Status: None   Collection Time: 11/28/14  1:16 PM  Result Value Ref Range   Tech Review Variant lymphs present, Few Smudge cells present       RADIOGRAPHY: No results found.     PATHOLOGY: None  ASSESSMENT/PLAN: Mr. Turnbo is a pleasant 69 yo male with a remote history of CLL and a recent CT angio of the chest revealed a small pulmonary nodule in the right lower lobe. He was having severe SOB at the time. He was also found to have PE's in both lungs. He was started on a Xarelto dose pack in December and is now taking 20 mg daily. This is the second time he has had a PE in 3 years. He also has history of DVT in the right leg and also chronic thrombus of the left arm.  He is currently on Xarelto 20 mg daily. His PCP feels that this will need to be lifelong.  His WBC 13.3 Neut %  15.9 and Lymph % 79.2. Hgb is 14.9, MCV 93, platelets 157 and LDH 298.  He has had no issues with infections. No bleeding.  We will set him up with a PET scan in the next week. After we view the results from this scan we will schedule his follow-up with [name].  All questions were answered. The patient knows to call the clinic with any problems, questions or concerns. We can certainly see the patient much sooner if necessary.  [facility]

## 2014-11-28 NOTE — Telephone Encounter (Signed)
Patient stated that he needs a prior authorization for the xarelto. Thanks, MI

## 2014-11-28 NOTE — Telephone Encounter (Signed)
Already complete.  Pharmacy has been notified.

## 2014-11-30 ENCOUNTER — Ambulatory Visit: Payer: Self-pay | Admitting: Hematology and Oncology

## 2014-11-30 ENCOUNTER — Ambulatory Visit: Payer: Self-pay

## 2014-12-01 ENCOUNTER — Telehealth: Payer: Self-pay | Admitting: Cardiovascular Disease

## 2014-12-01 ENCOUNTER — Other Ambulatory Visit: Payer: Self-pay | Admitting: *Deleted

## 2014-12-01 ENCOUNTER — Telehealth: Payer: Self-pay | Admitting: Family Medicine

## 2014-12-01 NOTE — Telephone Encounter (Signed)
SEE OTHER MESSAGE .[name]

## 2014-12-01 NOTE — Telephone Encounter (Signed)
PER  PHARMACIST  PT HAS  PICKED UP XARELTO  ON  11-29-14 AT  REG  PRICE   FOR HIS  INSURANCE .[name]

## 2014-12-01 NOTE — Telephone Encounter (Signed)
New Msg         Everything is ok Xarelto prescription.    Pt doesn't need call back.

## 2014-12-01 NOTE — Telephone Encounter (Signed)
Patient was referred by hospital to Pulmonogy ( Dr. Halford Chessman ) then referred to [facility] cancer center by you, [facility] then referred him to [place] who then referred him back again to [facility] for a PET scan.  Patient is just a little confused.  Should he still go to Dr. Halford Chessman since that appointment hasn't been set up yet or wait and let cancer center decide after PET scan?  Please advise.

## 2014-12-01 NOTE — Telephone Encounter (Signed)
LMTCB ./CY 

## 2014-12-05 ENCOUNTER — Other Ambulatory Visit: Payer: Self-pay | Admitting: *Deleted

## 2014-12-05 ENCOUNTER — Encounter: Payer: Self-pay | Admitting: *Deleted

## 2014-12-05 ENCOUNTER — Encounter: Payer: Self-pay | Admitting: Family Medicine

## 2014-12-05 MED ORDER — LORAZEPAM 0.5 MG PO TABS: 0.5000 mg | ORAL_TABLET | Freq: Every day | ORAL | Status: DC | PRN

## 2014-12-05 NOTE — Telephone Encounter (Signed)
Patient notified

## 2014-12-05 NOTE — Telephone Encounter (Signed)
Please advise refill for Lorazepam? Med never filled by provider?

## 2014-12-05 NOTE — Telephone Encounter (Signed)
I reviewed his appts in his chart and he does in fact have an appt with Dr. Halford Chessman set up for 01/02/15 and I do recommend he keep that appt.  Tell him sorry about the confusion. -thx

## 2014-12-05 NOTE — Telephone Encounter (Signed)
The nurse practitioner he saw at the Hem/Onc clinic named Laverna Peace (or someone affiliated with her office) will call him about the results.  She ordered the test, so she will follow up the result and get back with him.-thx

## 2014-12-05 NOTE — Telephone Encounter (Signed)
Patient notified. Patient still wants to know who will call him with the results?

## 2014-12-06 ENCOUNTER — Telehealth: Payer: Self-pay | Admitting: Hematology & Oncology

## 2014-12-06 NOTE — Telephone Encounter (Signed)
Left pt message with 2-1 appointment

## 2014-12-08 ENCOUNTER — Encounter (HOSPITAL_COMMUNITY)
Admission: RE | Admit: 2014-12-08 | Discharge: 2014-12-08 | Disposition: A | Discharge status: Home / Self Care | Payer: Medicare Other | Source: Ambulatory Visit | Attending: Family | Admitting: Family

## 2014-12-08 ENCOUNTER — Ambulatory Visit (INDEPENDENT_AMBULATORY_CARE_PROVIDER_SITE_OTHER): Payer: 59 | Admitting: Psychology

## 2014-12-08 DIAGNOSIS — F4321 Adjustment disorder with depressed mood: Secondary | ICD-10-CM

## 2014-12-08 DIAGNOSIS — R911 Solitary pulmonary nodule: Secondary | ICD-10-CM | POA: Diagnosis not present

## 2014-12-08 LAB — GLUCOSE, CAPILLARY: Glucose-Capillary: 88 mg/dL (ref 70–99)

## 2014-12-08 MED ORDER — FLUDEOXYGLUCOSE F - 18 (FDG) INJECTION
10.5000 | Freq: Once | INTRAVENOUS | Status: AC | PRN
  Administered 2014-12-08: 10.5 via INTRAVENOUS

## 2014-12-12 ENCOUNTER — Encounter: Payer: Self-pay | Admitting: Hematology & Oncology

## 2014-12-12 ENCOUNTER — Ambulatory Visit (HOSPITAL_BASED_OUTPATIENT_CLINIC_OR_DEPARTMENT_OTHER): Payer: Medicare Other | Admitting: Hematology & Oncology

## 2014-12-12 VITALS — BP 145/92 | HR 95 | Temp 97.2°F | Resp 20 | Ht 70.0 in | Wt 202.0 lb

## 2014-12-12 DIAGNOSIS — Z8579 Personal history of other malignant neoplasms of lymphoid, hematopoietic and related tissues: Secondary | ICD-10-CM

## 2014-12-12 DIAGNOSIS — I2699 Other pulmonary embolism without acute cor pulmonale: Secondary | ICD-10-CM

## 2014-12-12 NOTE — Progress Notes (Signed)
Hematology and Oncology Follow Up Visit  [name] [id number] 02/20/1946 69 y.o. 12/12/2014   Principle Diagnosis:   Right lower lobe pulmonary nodule-PET unavid  Recurrent pulmonary embolism  CLL-stable  Current Therapy:    Xarelto 20 mg by mouth daily     Interim History:  [name] is back for second office visit. We saw him about a week or so ago. He was referred because of this pulmonary nodule in the right lung. We did go ahead and get a PET scan on him. The PET scan showed a 9 x 12 mm right lower lobe lesion. This was unchanged from past scans area and there was no hypermetabolism.  He was a tobacco user. He did have sick hand tobacco use. He also has agent orange exposure.  Of interest, to me, is that he has a recurrent pulmonary embolism. This was documented in December. A hypercoagulable panel was negative for any thrombophilic state. As such, I think he will need lifelong anticoagulation. He is on Xarelto. I explained to him at length the differences between Xarelto and Coumadin and while we do not have to check blood levels with Xarelto.  He does have a history of CLL. He did undergo flow cytometry back in 2012. Flow cytometry did show that his lymphocytes were CD5 positive. He is asymptomatic with this.  He feels well. He's been active. He's had no chest wall pain. He's had no shortness of breath. His had no nausea vomiting. He's had no change in bowel or bladder habits. He's had no leg swelling. He did have a lower extremity Doppler done in early January which was negative for any thromboembolic disease.  He's had no fever. He's had no rashes. His appetite has been good. He moved down here from [place] about 14 years ago. He enjoys go back up to [place] to visit.   Medications:  Current outpatient prescriptions:  .  imipramine (TOFRANIL) 10 MG tablet, 2 tabs po qhs for insomnia, Disp: 60 tablet, Rfl: 11 .  LORazepam (ATIVAN) 0.5 MG tablet, Take 1  tablet (0.5 mg total) by mouth daily as needed for anxiety., Disp: 30 tablet, Rfl: 5 .  metoprolol succinate (TOPROL-XL) 25 MG 24 hr tablet, Take 1 tablet (25 mg total) by mouth daily., Disp: 30 tablet, Rfl: 6 .  PARoxetine (PAXIL) 20 MG tablet, Take 1 tablet (20 mg total) by mouth daily., Disp: 30 tablet, Rfl: 1 .  rivaroxaban (XARELTO) 20 MG TABS tablet, Take 1 tablet (20 mg total) by mouth daily with supper., Disp: 30 tablet, Rfl: 6  Allergies:  Allergies  Allergen Reactions  . Hydrocodone-Acetaminophen Shortness Of Breath  . Oxycodone Hcl Shortness Of Breath  . Telmisartan-Hctz Shortness Of Breath and Palpitations    Dizziness, too  . Ramipril   . Aspirin Other (See Comments)    Can't take high dose  . Atorvastatin Other (See Comments)    myalgia  . Buspirone Hcl Other (See Comments)    headache  . Codeine Phosphate Itching and Nausea Only  . Morphine Sulfate Itching  . Rabeprazole Other (See Comments)    headache    Past Medical History, Surgical history, Social history, and Family History were reviewed and updated.  Review of Systems: As above  Physical Exam:  height is 5\' 10"  (1.778 m) and weight is 202 lb (91.627 kg). His oral temperature is 97.2 F (36.2 C). His blood pressure is 145/92 and his pulse is 95. His respiration  is 87.   Well-developed and well-nourished African-American gentleman. There are no ocular or oral lesions. He has no palpable cervical or supraclavicular lymph nodes. Lungs are clear. Has a rales, wheezes or rhonchi. Cardiac exam regular rate and rhythm with no murmurs, rubs or bruits. Abdomen is soft. Has good bowel sounds. There is no fluid wave. There is no palpable liver or spleen tip. Back exam shows no tenderness over the spine, ribs or hips. Extremities shows no clubbing, cyanosis or edema. No venous cord is palpable is legs. He has a negative Homans sign. Skin exam shows no rashes, ecchymoses or petechia. Neurological exam is nonfocal.  Lab  Results  Component Value Date   WBC 13.3* 11/28/2014   HGB 14.9 11/28/2014   HCT 43.9 11/28/2014   MCV 93 11/28/2014   PLT 157 11/28/2014     Chemistry      Component Value Date/Time   NA 140 11/28/2014 1316   NA 137 11/03/2014 0507   K 4.8* 11/28/2014 1316   K 3.8 11/03/2014 0507   CL 100 11/28/2014 1316   CL 104 11/03/2014 0507   CO2 30 11/28/2014 1316   CO2 26 11/03/2014 0507   BUN 18 11/28/2014 1316   BUN 13 11/03/2014 0507   CREATININE 1.4* 11/28/2014 1316   CREATININE 1.13 11/03/2014 0507      Component Value Date/Time   CALCIUM 9.5 11/28/2014 1316   CALCIUM 8.7 11/03/2014 0507   ALKPHOS 56 11/28/2014 1316   ALKPHOS 62 04/11/2014 0915   AST 39* 11/28/2014 1316   AST 32 04/11/2014 0915   ALT 44 11/28/2014 1316   ALT 36 04/11/2014 0915   BILITOT 0.60 11/28/2014 1316   BILITOT 0.8 04/11/2014 0915         Impression and Plan: [name] is 69 year old African-American gentleman. He has recurrent pulmonary embolism. I think he needs lifelong anticoagulation.  Of note, he does have a tissue prosthetic aortic valve. This was placed about 4 years ago at [facility]. He's had no problems with this.  As far as this pulmonary nodule is concerning, I would repeat a CT scan in about 6 months. I just cannot imagine that this is a malignancy although a low-grade adenocarcinoma is possible. I think the only way to make this diagnosis would be with resection of the nodule. If we find that the nodule is growing in 6 months, then I would recommend resection.  I don't think his CLL is an issue. We can follow this 2 or 3 times a year lab work.  I spent a good 45 minutes with him. He was with his niece. I answered all their questions.  I want to repeat a CT angiogram in 2 months. Hopefully, we will see resolution of the emboli in his lungs.  Volanda Napoleon, MD 2/1/20165:08 PM

## 2014-12-20 NOTE — Telephone Encounter (Signed)
[name] - Client [facility] Patient Name: [name] DOB: September 24, 1946 Initial Comment Caller states he has sinus issues, he is on a blood thinner, and wants to know what he can take for the sinus issues, and neck pain. Nurse Assessment Nurse: Donalynn Furlong, RN, Myna Hidalgo Date/Time Eilene Ghazi Time): 12/20/2014 9:27:45 AM Confirm and document reason for call. If symptomatic, describe symptoms. ---Caller states he has sinus issues, he is on a blood thinner, and wants to know what he can take for the sinus issues, and neck pain.Afebrile. No sinus pain. Also states he has had neck pain for a long time. "I use aspercreme on it and take Tylenol only because that is all my doctor told me I could take so my blood would not get any thinner. My neck hurts in the morning, mostly when I get up, and I do some gentle exercises, and then it feels better" Has the patient traveled out of the country within the last 30 days? ---No Does the patient require triage? ---Yes Related visit to physician within the last 2 weeks? ---Yes Does the PT have any chronic conditions? (i.e. diabetes, asthma, etc.) ---Yes List chronic conditions. ---Fabio Pierce for pulmonary embolism history. 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

## 2014-12-22 ENCOUNTER — Ambulatory Visit (INDEPENDENT_AMBULATORY_CARE_PROVIDER_SITE_OTHER): Payer: 59 | Admitting: Psychology

## 2014-12-22 DIAGNOSIS — F4321 Adjustment disorder with depressed mood: Secondary | ICD-10-CM

## 2015-01-02 ENCOUNTER — Institutional Professional Consult (permissible substitution): Payer: Self-pay | Admitting: Pulmonary Disease

## 2015-01-05 ENCOUNTER — Ambulatory Visit: Payer: 59 | Admitting: Psychology

## 2015-01-09 ENCOUNTER — Other Ambulatory Visit: Payer: Self-pay | Admitting: Cardiovascular Disease

## 2015-01-17 ENCOUNTER — Other Ambulatory Visit: Payer: Self-pay | Admitting: *Deleted

## 2015-01-17 ENCOUNTER — Encounter: Payer: Self-pay | Admitting: Family Medicine

## 2015-01-17 ENCOUNTER — Ambulatory Visit: Payer: Self-pay | Admitting: Family Medicine

## 2015-01-17 MED ORDER — PAROXETINE HCL 20 MG PO TABS: 20.0000 mg | ORAL_TABLET | Freq: Every day | ORAL | Status: DC

## 2015-01-17 NOTE — Telephone Encounter (Signed)
Refill request for paroxetine Last filled by MD on- 11/09/14 #30 x1 Last Appt: 11/16/2014 Next Appt: none Please advise refill?

## 2015-01-17 NOTE — Telephone Encounter (Signed)
Paroxetine RF'd.

## 2015-01-19 ENCOUNTER — Ambulatory Visit: Payer: 59 | Admitting: Psychology

## 2015-01-21 ENCOUNTER — Other Ambulatory Visit: Payer: Self-pay | Admitting: Cardiovascular Disease

## 2015-01-23 ENCOUNTER — Ambulatory Visit (INDEPENDENT_AMBULATORY_CARE_PROVIDER_SITE_OTHER): Payer: Medicare Other | Admitting: Family Medicine

## 2015-01-23 ENCOUNTER — Encounter: Payer: Self-pay | Admitting: Family Medicine

## 2015-01-23 VITALS — BP 127/80 | HR 70 | Temp 98.2°F | Ht 70.0 in | Wt 203.0 lb

## 2015-01-23 DIAGNOSIS — Z23 Encounter for immunization: Secondary | ICD-10-CM

## 2015-01-23 DIAGNOSIS — G4700 Insomnia, unspecified: Secondary | ICD-10-CM

## 2015-01-23 DIAGNOSIS — Z0000 Encounter for general adult medical examination without abnormal findings: Secondary | ICD-10-CM

## 2015-01-23 DIAGNOSIS — I2699 Other pulmonary embolism without acute cor pulmonale: Secondary | ICD-10-CM

## 2015-01-23 DIAGNOSIS — F411 Generalized anxiety disorder: Secondary | ICD-10-CM

## 2015-01-23 DIAGNOSIS — R911 Solitary pulmonary nodule: Secondary | ICD-10-CM

## 2015-01-23 MED ORDER — IMIPRAMINE HCL 10 MG PO TABS: ORAL_TABLET | ORAL | Status: DC

## 2015-01-23 NOTE — Progress Notes (Signed)
Pre visit review using our clinic review tool, if applicable. No additional management support is needed unless otherwise documented below in the visit note. 

## 2015-01-23 NOTE — Progress Notes (Signed)
OFFICE NOTE  01/23/2015  CC:  Chief Complaint  Patient presents with  . Follow-up   HPI: Patient is a 69 y.o. African-American male who is here for f/u anxiety, HTN, hx of recurrent PE, hx of pulm nodule. Says anxiety is gradually improving, says he can get himself through times of increased stress/panic much better lately. Still takes some ativan prn. Sleeps fine on imiprimine 10mg  qhs, says he cannot tolerate TWO of these qhs as the sig says.  Has seen [name] and CLL stable, pulm nodule plan is to do repeat CT 6 mo and see if growing and ask for surgical excision if growing, but continue monitoring with periodic CT scans if stable.  Pertinent PMH:  Past Medical History  Diagnosis Date  . Hypertension   . Hyperlipidemia   . PAF (paroxysmal atrial fibrillation)   . Mitral regurgitation   . History of pulmonary embolism 2011; 10/2014    Recurrence off of anticoagulation 10/2014: per hem/onc ([name]) pt needs lifelong anticoagulation (hypercoag w/u neg).  . Aortic regurgitation 04/2007 surgery    Resolved with tissue AVR at [facility]  . History of prostate cancer 2003    Rad prost  . Anticoagulants causing adverse effect in therapeutic use   . Migraine syndrome   . Cervical spondylosis     ESI by [name] in Ortho  . Chronic lymphocytic leukemia (CLL), B-cell approx 2012  . Pulmonary nodule, right 2015    RLL   Past Surgical History  Procedure Laterality Date  . Cardiac catheterization    . Penile prosthesis implant    . Prostatectomy  04/2002    for prostate cancer  . Cataract extraction      L 12/06/09   R 09/14/09  . Aortic valve replacement  2011    [place]   . Colonoscopy  01/26/13    tic's, o/w normal.  ([facility])--recall 10 yrs.    MEDS:  Outpatient Prescriptions Prior to Visit  Medication Sig Dispense Refill  . imipramine (TOFRANIL) 10 MG tablet 2 tabs po qhs for insomnia 60 tablet 11  . LORazepam (ATIVAN) 0.5 MG tablet Take 1 tablet (0.5 mg total) by mouth  daily as needed for anxiety. 30 tablet 5  . metoprolol succinate (TOPROL-XL) 25 MG 24 hr tablet TAKE 1 TABLET BY MOUTH EVERY DAY 30 tablet 2  . PARoxetine (PAXIL) 20 MG tablet Take 1 tablet (20 mg total) by mouth daily. 30 tablet 5  . rivaroxaban (XARELTO) 20 MG TABS tablet Take 1 tablet (20 mg total) by mouth daily with supper. 30 tablet 6   No facility-administered medications prior to visit.    PE: Blood pressure 127/80, pulse 70, temperature 98.2 F (36.8 C), temperature source Temporal, height 5\' 10"  (1.778 m), weight 203 lb (92.08 kg), SpO2 98 %. Gen: Alert, well appearing.  Patient is oriented to person, place, time, and situation. No further exam today.  IMPRESSION AND PLAN:  1) GAD w/panic: stable/gradual improvement.  2) Recurrent PE; stable.  Indefinite xarelto is the plan.  3) Pulm nodule; likely benign.  Plan for f/u CT scan 6 from date of last one and if it has grown then plan is to ask CT surgery to resect it for tissue dx (per Dr. Antonieta Pert recommendations).  4) Preventative health care: prevnar 13 Im today. He'll need a pneumovax booster a year from now (his first one was in 2012 when he was less than 49 yrs old).  5) Insomnia: continue imiprimine 10mg  qhs.  Will cancel prev rx that has sig 2 qhs b/c he is accumulating a lot of excess pills.  An After Visit Summary was printed and given to the patient.  FOLLOW UP: 4 mo

## 2015-01-30 ENCOUNTER — Ambulatory Visit: Payer: 59 | Admitting: Psychology

## 2015-02-06 ENCOUNTER — Encounter (HOSPITAL_BASED_OUTPATIENT_CLINIC_OR_DEPARTMENT_OTHER): Payer: Self-pay

## 2015-02-06 ENCOUNTER — Other Ambulatory Visit (HOSPITAL_BASED_OUTPATIENT_CLINIC_OR_DEPARTMENT_OTHER): Payer: Medicare Other

## 2015-02-06 ENCOUNTER — Ambulatory Visit (HOSPITAL_BASED_OUTPATIENT_CLINIC_OR_DEPARTMENT_OTHER)
Admission: RE | Admit: 2015-02-06 | Discharge: 2015-02-06 | Disposition: A | Discharge status: Home / Self Care | Payer: Medicare Other | Source: Ambulatory Visit | Attending: Hematology & Oncology | Admitting: Hematology & Oncology

## 2015-02-06 ENCOUNTER — Encounter: Payer: Self-pay | Admitting: Family

## 2015-02-06 ENCOUNTER — Ambulatory Visit (HOSPITAL_BASED_OUTPATIENT_CLINIC_OR_DEPARTMENT_OTHER): Payer: Medicare Other | Admitting: Family

## 2015-02-06 VITALS — BP 153/89 | HR 62 | Temp 97.5°F | Resp 18 | Ht 70.0 in | Wt 203.0 lb

## 2015-02-06 DIAGNOSIS — C9110 Chronic lymphocytic leukemia of B-cell type not having achieved remission: Secondary | ICD-10-CM

## 2015-02-06 DIAGNOSIS — Z952 Presence of prosthetic heart valve: Secondary | ICD-10-CM | POA: Insufficient documentation

## 2015-02-06 DIAGNOSIS — I2699 Other pulmonary embolism without acute cor pulmonale: Secondary | ICD-10-CM | POA: Diagnosis present

## 2015-02-06 DIAGNOSIS — E785 Hyperlipidemia, unspecified: Secondary | ICD-10-CM | POA: Insufficient documentation

## 2015-02-06 DIAGNOSIS — R911 Solitary pulmonary nodule: Secondary | ICD-10-CM | POA: Diagnosis not present

## 2015-02-06 DIAGNOSIS — Z7901 Long term (current) use of anticoagulants: Secondary | ICD-10-CM | POA: Diagnosis not present

## 2015-02-06 DIAGNOSIS — Z856 Personal history of leukemia: Secondary | ICD-10-CM | POA: Diagnosis not present

## 2015-02-06 DIAGNOSIS — I10 Essential (primary) hypertension: Secondary | ICD-10-CM | POA: Insufficient documentation

## 2015-02-06 DIAGNOSIS — Z86711 Personal history of pulmonary embolism: Secondary | ICD-10-CM | POA: Diagnosis not present

## 2015-02-06 DIAGNOSIS — I517 Cardiomegaly: Secondary | ICD-10-CM | POA: Diagnosis not present

## 2015-02-06 DIAGNOSIS — Z8546 Personal history of malignant neoplasm of prostate: Secondary | ICD-10-CM | POA: Diagnosis not present

## 2015-02-06 DIAGNOSIS — Z9079 Acquired absence of other genital organ(s): Secondary | ICD-10-CM | POA: Diagnosis not present

## 2015-02-06 DIAGNOSIS — I340 Nonrheumatic mitral (valve) insufficiency: Secondary | ICD-10-CM | POA: Insufficient documentation

## 2015-02-06 DIAGNOSIS — I480 Paroxysmal atrial fibrillation: Secondary | ICD-10-CM | POA: Insufficient documentation

## 2015-02-06 LAB — CMP (CANCER CENTER ONLY)
ALT(SGPT): 28 U/L (ref 10–47)
AST: 28 U/L (ref 11–38)
Albumin: 3.6 g/dL (ref 3.3–5.5)
Alkaline Phosphatase: 71 U/L (ref 26–84)
BUN, Bld: 16 mg/dL (ref 7–22)
CO2: 28 mEq/L (ref 18–33)
Calcium: 9.1 mg/dL (ref 8.0–10.3)
Chloride: 107 mEq/L (ref 98–108)
Creat: 1.5 mg/dl — ABNORMAL HIGH (ref 0.6–1.2)
Glucose, Bld: 96 mg/dL (ref 73–118)
Potassium: 4.2 mEq/L (ref 3.3–4.7)
Sodium: 141 mEq/L (ref 128–145)
Total Bilirubin: 0.5 mg/dl (ref 0.20–1.60)
Total Protein: 7 g/dL (ref 6.4–8.1)

## 2015-02-06 LAB — CBC WITH DIFFERENTIAL (CANCER CENTER ONLY)
BASO#: 0.1 10*3/uL (ref 0.0–0.2)
BASO%: 0.6 % (ref 0.0–2.0)
EOS%: 1.5 % (ref 0.0–7.0)
Eosinophils Absolute: 0.2 10*3/uL (ref 0.0–0.5)
HCT: 42.8 % (ref 38.7–49.9)
HGB: 14.6 g/dL (ref 13.0–17.1)
LYMPH#: 10.2 10*3/uL — ABNORMAL HIGH (ref 0.9–3.3)
LYMPH%: 80.9 % — ABNORMAL HIGH (ref 14.0–48.0)
MCH: 31.9 pg (ref 28.0–33.4)
MCHC: 34.1 g/dL (ref 32.0–35.9)
MCV: 93 fL (ref 82–98)
MONO#: 0.4 10*3/uL (ref 0.1–0.9)
MONO%: 2.8 % (ref 0.0–13.0)
NEUT#: 1.8 10*3/uL (ref 1.5–6.5)
NEUT%: 14.2 % — ABNORMAL LOW (ref 40.0–80.0)
Platelets: 156 10*3/uL (ref 145–400)
RBC: 4.58 10*6/uL (ref 4.20–5.70)
RDW: 13.4 % (ref 11.1–15.7)
WBC: 12.7 10*3/uL — ABNORMAL HIGH (ref 4.0–10.0)

## 2015-02-06 LAB — TECHNOLOGIST REVIEW CHCC SATELLITE

## 2015-02-06 LAB — CHCC SATELLITE - SMEAR

## 2015-02-06 MED ORDER — IOHEXOL 350 MG/ML SOLN
100.0000 mL | Freq: Once | INTRAVENOUS | Status: AC | PRN
  Administered 2015-02-06: 100 mL via INTRAVENOUS

## 2015-02-06 NOTE — Progress Notes (Addendum)
Hematology and Oncology Follow Up Visit  [name] [id number] 11-Oct-1946 69 y.o. 02/06/2015   Principle Diagnosis:  Right lower lobe pulmonary nodule-PET unavid Recurrent pulmonary embolism CLL-stable  Current Therapy:   Xarelto 20 mg by mouth daily    Interim History:  [name] is here today for a follow-up. He had a CT angio of the chest earlier today. This showed that his pulmonary embolus has resolved. However, his right lower lobe nodule has increased in size from 9 mm x 12 mm x 7 mm to 16 mm x 11 mm x 57mm. We discussed him going to see a thoracic surgeon. He would like to go to [facility] to see Dr. Elenor Quinones. [name] will call and speak with him tomorrow. He is travelling to [place] in mid April and would like to go to [place] before he leaves.  He has a history of CLL (7 years ago) and also prostate cancer.  He was a tobacco user but quit years ago. 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 He also had agent orange exposure.  He does have a tissue prosthetic aortic valve that was placed 4 years ago at [facility]. He has had no problems with this.  He denies fever, chills, rash, SOB, chest pain, abdominal pain, change in bowel or bladder habits. No lymphadenopathy.  He is on lifelong anticoagulation with Xarelto. He has done well and has had no episodes of bleeding.    He has no swelling, tenderness, numbness or tingling in his extremities.  His appetite is good. He has had no weight loss or gain.  Medications:    Medication List       This list is accurate as of: 02/06/15  3:31 PM.  Always use your most recent med list.               imipramine 10 MG tablet  Commonly known as:  TOFRANIL  1 tab po qhs for insomnia     LORazepam 0.5 MG tablet  Commonly known as:  ATIVAN  Take 1 tablet (0.5 mg total) by mouth daily as needed for anxiety.     metoprolol succinate 25 MG 24 hr tablet  Commonly known as:  TOPROL-XL  TAKE 1 TABLET BY MOUTH EVERY  DAY     PARoxetine 20 MG tablet  Commonly known as:  PAXIL  Take 1 tablet (20 mg total) by mouth daily.     rivaroxaban 20 MG Tabs tablet  Commonly known as:  XARELTO  Take 1 tablet (20 mg total) by mouth daily with supper.        Allergies:  Allergies  Allergen Reactions  . Hydrocodone-Acetaminophen Shortness Of Breath  . Oxycodone Hcl Shortness Of Breath  . Telmisartan-Hctz Shortness Of Breath and Palpitations    Dizziness, too  . Ramipril   . Aspirin Other (See Comments)    Can't take high dose  . Atorvastatin Other (See Comments)    myalgia  . Buspirone Hcl Other (See Comments)    headache  . Codeine Phosphate Itching and Nausea Only  . Morphine Sulfate Itching  . Rabeprazole Other (See Comments)    headache    Past Medical History, Surgical history, Social history, and Family History were reviewed and updated.  Review of Systems: All other 10 point review of systems is negative.   Physical Exam:  height is 5\' 10"  (1.778 m) and weight is 203 lb (92.08 kg). His oral temperature is 97.5  F (36.4 C). His blood pressure is 153/89 and his pulse is 62. His respiration is 18.   Wt Readings from Last 3 Encounters:  02/06/15 203 lb (92.08 kg)  01/23/15 203 lb (92.08 kg)  12/12/14 202 lb (91.627 kg)    Ocular: Sclerae unicteric, pupils equal, round and reactive to light Ear-nose-throat: Oropharynx clear, dentition fair Lymphatic: No cervical or supraclavicular adenopathy Lungs no rales or rhonchi, good excursion bilaterally Heart regular rate and rhythm, no murmur appreciated Abd soft, nontender, positive bowel sounds MSK no focal spinal tenderness, no joint edema Neuro: non-focal, well-oriented, appropriate affect   Lab Results  Component Value Date   WBC 12.7* 02/06/2015   HGB 14.6 02/06/2015   HCT 42.8 02/06/2015   MCV 93 02/06/2015   PLT 156 02/06/2015   No results found for: FERRITIN, IRON, TIBC, UIBC, IRONPCTSAT Lab Results  Component Value Date    RETICCTPCT 1.04 05/06/2011   RBC 4.58 02/06/2015   RETICCTABS 51.79 05/06/2011   Lab Results  Component Value Date   KPAFRELGTCHN 1.91 05/06/2011   LAMBDASER 1.72 05/06/2011   KAPLAMBRATIO 1.11 05/06/2011   Lab Results  Component Value Date   IGGSERUM 999 05/06/2011   IGA 195 05/06/2011   IGMSERUM 171 05/06/2011   Lab Results  Component Value Date   TOTALPROTELP 7.0 05/06/2011     Chemistry      Component Value Date/Time   NA 141 02/06/2015 1421   NA 137 11/03/2014 0507   K 4.2 02/06/2015 1421   K 3.8 11/03/2014 0507   CL 107 02/06/2015 1421   CL 104 11/03/2014 0507   CO2 28 02/06/2015 1421   CO2 26 11/03/2014 0507   BUN 16 02/06/2015 1421   BUN 13 11/03/2014 0507   CREATININE 1.5* 02/06/2015 1421   CREATININE 1.13 11/03/2014 0507      Component Value Date/Time   CALCIUM 9.1 02/06/2015 1421   CALCIUM 8.7 11/03/2014 0507   ALKPHOS 71 02/06/2015 1421   ALKPHOS 62 04/11/2014 0915   AST 28 02/06/2015 1421   AST 32 04/11/2014 0915   ALT 28 02/06/2015 1421   ALT 36 04/11/2014 0915   BILITOT 0.50 02/06/2015 1421   BILITOT 0.8 04/11/2014 0915     Impression and Plan: [name] is 69 year old African-American gentleman who had a recurrent PE. He is now on lifelong anticoagulation with xarelto and is doing well. CT angio today showed that his PE has resolved. However, his right lower lobe nodule has increased in size from 9 mm x 12 mm x 7 mm to 16 mm x 11 mm x 28mm. [name] will call and speak with Thoracic surgeon Dr. Elenor Quinones at [facility] tomorrow about a resection. We will set him up with an appointment there within the next few weeks.    His CT angiograms from 11/02/14 and today and PET scan from 12/08/14 have been put on a disc. He will pick it up downstairs before leaving today to take with him to [facility].  All questions at this time were answered. He knows to call here with any other questions or concerns.  We will see him back here in 3 months for follow-up.   The  patient was discussed with and also seen by [name] and he is in agreement with the aforementioned.   Eliezer Bottom, NP 3/28/20163:31 PM   Addendum:  I saw and examined the patient with Sarah.  I am worried about this right lower lobe nodule. Even though it was negative  on PET scan a couple months ago, I would think that the growth is more indicative of the fact that it is malignant. Even with another PET scan, and if it is negative, I still think that this nodule has to be resected. He has a past history of tobacco use. He does have a history of CLL. He is at risk for malignancy based on these 2 factors.  He has seen doctors at [facility] before. As such, we will see her if Dr. Marye Round at [facility] can see him. Whether or not Dr. Elenor Quinones wants another PET scan I will leave that up to him. However, I think that the growth of this lesion is the key and that he needs to come out and does not be biopsied.  One would have to think that if this is malignant, this is a low-grade adenocarcinoma. These can not be active on PET scan.  We spent about 45 minutes with Mr. Kolodziejski. We explained to him the situation. He got the scans put on a disc so that he can go out to see the thoracic surgeons at [facility] and they can look at this disc.  We can probably get him back to see [name] in 3 months. By then, he would've had surgical intervention, which I really think is the best way to go in this case.  Pete E.

## 2015-02-15 ENCOUNTER — Encounter: Payer: Self-pay | Admitting: Nurse Practitioner

## 2015-02-15 NOTE — Progress Notes (Signed)
All records routed to Dr. Elenor Quinones as requested for new patient appointment.

## 2015-02-16 ENCOUNTER — Ambulatory Visit (INDEPENDENT_AMBULATORY_CARE_PROVIDER_SITE_OTHER): Payer: 59 | Admitting: Psychology

## 2015-02-16 DIAGNOSIS — F4321 Adjustment disorder with depressed mood: Secondary | ICD-10-CM | POA: Diagnosis not present

## 2015-03-02 ENCOUNTER — Ambulatory Visit: Payer: 59 | Admitting: Psychology

## 2015-03-07 DIAGNOSIS — Z86711 Personal history of pulmonary embolism: Secondary | ICD-10-CM | POA: Diagnosis present

## 2015-03-16 ENCOUNTER — Ambulatory Visit (INDEPENDENT_AMBULATORY_CARE_PROVIDER_SITE_OTHER): Payer: 59 | Admitting: Psychology

## 2015-03-16 DIAGNOSIS — F4321 Adjustment disorder with depressed mood: Secondary | ICD-10-CM

## 2015-03-20 NOTE — Progress Notes (Signed)
Patient ID: [name], male   DOB: [birth year], 69 y.o.   MRN: [id number] [name] is seen for F/U of anticoagulation post AVR, history of PAF. DVT with large bilateral PE 9/11. F/U CT and LE [name] with resolving thrombus burden 09/18/10. Fairly long period of subRx anticoagulation on 2 separate courses of Lovenox. INR now Rx. Still some dyspnea and fatigue. No SSCP, palpitations, edema. No bleeding problems. Duplex done 01/18/11 showed Some partial nonoccluding clot in SFA and proximal popliteal. He really wants to come off coumadin. He will finish a year of anticoagulation at the end of this month and has good resolution of thrombus burden by imaging Recent Dx of low grade CLL. Sees [employer]. Taking prophylactic Xarelto for flights.   10/15 had LUE superficial phlebitis  Left forearm feels fine  Reviewed US venous duplex and there is still some superficial thrombosis in the basilic system but nothing new and nothing proximal  Took some xarelto for his trip to [place]  11/2014  Venous US LLE no DVT   Ech o1/15   Study Conclusions  - Left ventricle: The cavity size was normal. Wall thickness was increased in a pattern of moderate LVH. Systolic function was normal. The estimated ejection fraction was in the range of 55% to 60%. Wall motion was normal; there were no regional wall motion abnormalities. Doppler parameters are consistent with abnormal left ventricular relaxation (grade 1 diastolic dysfunction). - Aortic valve: A bioprosthesis was present. There was moderate stenosis. Trivial regurgitation. - Mitral valve: Mild regurgitation. - Atrial septum: There was an atrial septal aneurysm. Impressions:  - Oscillating density associated with MV most likely redundant chordae. Prosthetic aortic valve with mean gradient of 30 mmHg (higher than expected for bioprosthetic aortic valve; increased compared to 07/17/10).  25 mm tissue valve done at [facility] by Glower in 2011  Had echo today and I reviewed the  study at length  Gradients are actually lower with mean 16 and peak of 32mmHg  The tissue leaflets have some restricted motion but no calcium  EF 50-55%  Long discussion about recent evidence that coumadin may help deteriorating tissue valves.  He does not want to be on coumadin and this f/u echo is more reassuring than one last year.    Also discussed possibility of valve in valve TAVR when time comes rather than 2nd surgery which he was more excited to hear  Seen by Dr Anitra Lauth recently and Effexor weaned with paxil started for anxiety  Has  RLL lung nodule that is enlarging  Seeking opinion from [facility] regarding possible resection    ROS: Denies fever, malais, weight loss, blurry vision, decreased visual acuity, cough, sputum, SOB, hemoptysis, pleuritic pain, palpitaitons, heartburn, abdominal pain, melena, lower extremity edema, claudication, or rash.  All other systems reviewed and negative  General: Affect appropriate Healthy:  appears stated age 69: normal Neck supple with no adenopathy JVP normal no bruits no thyromegaly Lungs clear with no wheezing and good diaphragmatic motion Heart:  S1/S2 SEM  murmur, no rub, gallop or click PMI normal Abdomen: benighn, BS positve, no tenderness, no AAA no bruit.  No HSM or HJR Distal pulses intact with no bruits No edema Neuro non-focal Skin warm and dry No muscular weakness   Current Outpatient Prescriptions  Medication Sig Dispense Refill  . imipramine (TOFRANIL) 10 MG tablet 1 tab po qhs for insomnia 30 tablet 6  . LORazepam (ATIVAN) 0.5 MG tablet Take 1 tablet (0.5 mg total) by mouth daily  as needed for anxiety. 30 tablet 5  . metoprolol succinate (TOPROL-XL) 25 MG 24 hr tablet TAKE 1 TABLET BY MOUTH EVERY DAY 30 tablet 2  . PARoxetine (PAXIL) 20 MG tablet Take 1 tablet (20 mg total) by mouth daily. 30 tablet 5  . rivaroxaban (XARELTO) 20 MG TABS tablet Take 1 tablet (20 mg total) by mouth daily with supper. 30 tablet 6   No  current facility-administered medications for this visit.    Allergies  Hydrocodone-acetaminophen; Oxycodone hcl; Telmisartan-hctz; Ramipril; Aspirin; Atorvastatin; Buspirone hcl; Codeine phosphate; Morphine sulfate; and Rabeprazole  Electrocardiogram:  1/13  SR rate 78  RBBB/LAFB  LVH  11/03/14  SR rate 83  RBBB LAFB  LVH no sig change from 2013   Assessment and Plan

## 2015-03-22 ENCOUNTER — Encounter: Payer: Self-pay | Admitting: Physician Assistant

## 2015-03-22 ENCOUNTER — Encounter: Payer: Self-pay | Admitting: Cardiovascular Disease

## 2015-03-22 ENCOUNTER — Ambulatory Visit (INDEPENDENT_AMBULATORY_CARE_PROVIDER_SITE_OTHER): Payer: Medicare Other | Admitting: Physician Assistant

## 2015-03-22 VITALS — BP 140/85 | HR 67 | Ht 70.0 in | Wt 200.0 lb

## 2015-03-22 DIAGNOSIS — I10 Essential (primary) hypertension: Secondary | ICD-10-CM

## 2015-03-22 DIAGNOSIS — Z954 Presence of other heart-valve replacement: Secondary | ICD-10-CM

## 2015-03-22 DIAGNOSIS — Z86718 Personal history of other venous thrombosis and embolism: Secondary | ICD-10-CM | POA: Diagnosis not present

## 2015-03-22 DIAGNOSIS — E785 Hyperlipidemia, unspecified: Secondary | ICD-10-CM

## 2015-03-22 DIAGNOSIS — I351 Nonrheumatic aortic (valve) insufficiency: Secondary | ICD-10-CM

## 2015-03-22 DIAGNOSIS — Z01810 Encounter for preprocedural cardiovascular examination: Secondary | ICD-10-CM | POA: Diagnosis not present

## 2015-03-22 DIAGNOSIS — I2699 Other pulmonary embolism without acute cor pulmonale: Secondary | ICD-10-CM

## 2015-03-22 DIAGNOSIS — Z952 Presence of prosthetic heart valve: Secondary | ICD-10-CM | POA: Insufficient documentation

## 2015-03-22 DIAGNOSIS — I480 Paroxysmal atrial fibrillation: Secondary | ICD-10-CM

## 2015-03-22 DIAGNOSIS — R911 Solitary pulmonary nodule: Secondary | ICD-10-CM

## 2015-03-22 NOTE — Patient Instructions (Signed)
Medication Instructions:  Your physician recommends that you continue on your current medications as directed. Please refer to the Current Medication list given to you today.   Labwork: NONE  Testing/Procedures: Your physician has requested that you have an echocardiogram THIS IS TO BE DONE AFTER 09/21/2015. Echocardiography is a painless test that uses sound waves to create images of your heart. It provides your doctor with information about the size and shape of your heart and how well your heart's chambers and valves are working. This procedure takes approximately one hour. There are no restrictions for this procedure.   Follow-Up: FOLLOW UP WITH DR. [name] IN 09/2015 ONCE YOUR ECHO HAS BEEN COMPLETED  Any Other Special Instructions Will Be Listed Below (If Applicable).

## 2015-03-22 NOTE — Progress Notes (Signed)
Cardiology Office Note   Date:  03/22/2015   ID:  [name], DOB [birth year], MRN [id number]  PCP:  [name], MD  Cardiologist:  [name]     Chief Complaint  Patient presents with  . Surgical clearance  . Aortic Insufficiency s/p AVR  . Atrial Fibrillation     History of Present Illness: [name] is a 69 y.o. male with a hx of severe aortic insufficiency, status post bioprosthetic AVR by [name] at [facility] in 05/2010, normal coronary arteries by cardiac catheterization in 2011, prior DVT, persistent LUE basilic/cephalic thrombus, recurrent pulmonary emboli (most recently 10/2014), PAF, Xarelto anticoagulation, HTN, HL, CLL, prostate CA status post radical prostatectomy, RLL lung nodule. The patient has been followed by [name] with oncology. PET scan has been negative for hypermetabolic activity regarding his lung nodule. However, recent chest CT demonstrated increasing size. Of note, there was resolution of pulmonary emboli. Resection has been recommended. The patient is being referred to a thoracic surgeon at [facility].  He has seen [name].  Plan is for R VATS wedge resection and possible lobectomy. He missed his routine FU appointment with [name] this AM and was added on to my schedule.  He has been doing well.  He participates in and teaches Tai Chi.  He remains very active.  He denies chest pain or significant dyspnea.  He denies orthopnea, PND, edema.  He denies syncope.   Studies/Reports Reviewed Today:  Chest CTA 02/06/15 IMPRESSION: 1. Previously described pulmonary emboli have resolved. There are no new pulmonary emboli. 2. Right lower lobe pulmonary nodule has increased in size. It shows no appreciable hyper metabolism on the recent prior PET-CT. That given the size increase, biopsy should be considered. 3. No acute findings in the lungs. 4. Stable mild cardiomegaly and stable changes from previous cardiac surgery and aortic  valve replacement.  Echo 09/20/14 - Mild concentric hypertrophy. EF 50% to 55%. Wallmotion was normal; Grade 1 diastolic dysfunction. - Aortic valve: A bioprosthetic valve sits well in the aorticposition. There is no central AI or paravalvular leak. Peak andmean gradients are 30 and 16 mmHg, respectively and are normalfor this type of prosthesis. Mean gradient (S): 16 mm Hg. Peakgradient (S): 30 mm Hg. - Aortic root: The aortic root was normal in size. - Mitral valve: Structurally normal valve. There was mildregurgitation. - Left atrium: The atrium was mildly dilated. - Right ventricle: Systolic function was normal. - Right atrium: The atrium was normal in size. - Pulmonic valve: There was mild regurgitation. - Pulmonary arteries: Systolic pressure was within the normalrange. - Inferior vena cava: The vessel was normal in size. - Pericardium, extracardiac: There was no pericardial effusion. Impressions: When compared to the study from 11/26/2013 transaortic gradientsare now decreased.  Upper Ext Venous Duplex 07/2014 Acute superficial thrombus noted in the left mid to distal basilic and mid cephalic vein. All other left upper extremity deep and superficial veins, show no evidence of intraluminal thrombus.  LHC 04/2010 Normal coronary arteries   Past Medical History  Diagnosis Date  . Hypertension   . Hyperlipidemia   . PAF (paroxysmal atrial fibrillation)   . Mitral regurgitation   . History of pulmonary embolism 2011; 10/2014    Recurrence off of anticoagulation 10/2014: per hem/onc ([name]) pt needs lifelong anticoagulation (hypercoag w/u neg).  . Aortic regurgitation 04/2007 surgery    Resolved with tissue AVR at [facility]  . History of prostate cancer 2003  Rad prost  . Anticoagulants causing adverse effect in therapeutic use   . Migraine syndrome   . Cervical spondylosis     ESI by [name] in Ortho  . Chronic lymphocytic leukemia (CLL), B-cell approx 2012  .  Pulmonary nodule, right 2015    RLL    Past Surgical History  Procedure Laterality Date  . Cardiac catheterization    . Penile prosthesis implant    . Prostatectomy  04/2002    for prostate cancer  . Cataract extraction      L 12/06/09   R 09/14/09  . Aortic valve replacement  2011    [facility]   . Colonoscopy  01/26/13    tic's, o/w normal.  ([facility])--recall 10 yrs.     Current Outpatient Prescriptions  Medication Sig Dispense Refill  . amoxicillin (AMOXIL) 500 MG capsule Take 500 mg by mouth 2 (two) times daily. FOR DENTAL PROCEDURES    . imipramine (TOFRANIL) 10 MG tablet 1 tab po qhs for insomnia 30 tablet 6  . LORazepam (ATIVAN) 0.5 MG tablet Take 1 tablet (0.5 mg total) by mouth daily as needed for anxiety. 30 tablet 5  . metoprolol succinate (TOPROL-XL) 25 MG 24 hr tablet TAKE 1 TABLET BY MOUTH EVERY DAY 30 tablet 2  . PARoxetine (PAXIL) 20 MG tablet Take 1 tablet (20 mg total) by mouth daily. 30 tablet 5  . rivaroxaban (XARELTO) 20 MG TABS tablet Take 1 tablet (20 mg total) by mouth daily with supper. 30 tablet 6   No current facility-administered medications for this visit.    Allergies:   Hydrocodone-acetaminophen; Oxycodone hcl; Telmisartan-hctz; Ramipril; Aspirin; Atorvastatin; Buspirone hcl; Codeine; Codeine phosphate; Hydrocodone-acetaminophen; Morphine sulfate; and Rabeprazole    Social History:  The patient  reports that he quit smoking about 34 years ago. His smoking use included Cigarettes. He started smoking about 46 years ago. He has a 12 pack-year smoking history. He has never used smokeless tobacco. He reports that he drinks alcohol. He reports that he does not use illicit drugs.   Family History:  The patient's family history includes Cancer in his sister and another family member; Colon cancer (age of onset: 56) in his sister; Hyperlipidemia in his other; Hypertension in his other; Stroke in his brother, mother, and sister. There is no history of Heart attack.     ROS:   Please see the history of present illness.   Review of Systems  All other systems reviewed and are negative.     PHYSICAL EXAM: VS:  BP 140/85 mmHg  Pulse 67  Ht $R'5\' 10"'zO$  (1.778 m)  Wt 200 lb (90.719 kg)  BMI 28.70 kg/m2    Wt Readings from Last 3 Encounters:  03/22/15 200 lb (90.719 kg)  02/06/15 203 lb (92.08 kg)  01/23/15 203 lb (92.08 kg)     GEN: Well nourished, well developed, in no acute distress HEENT: normal Neck: no JVD, no carotid bruits, no masses Cardiac:  Normal S1/accentuated S2, RRR; no murmur ,  no rubs or gallops, no edema  Respiratory:  clear to auscultation bilaterally, no wheezing, rhonchi or rales. GI: soft, nontender, nondistended, + BS MS: no deformity or atrophy Skin: warm and dry  Neuro:  CNs II-XII intact, Strength and sensation are intact Psych: Normal affect   EKG:  EKG is ordered today.  It demonstrates:   NSR, HR 67, LAFB, RBBB, no change from prior tracing   Recent Labs: 04/11/2014: TSH 1.93 02/06/2015: ALT 28; BUN 16; Creatinine 1.5*;  Hemoglobin 14.6; Platelets 156; Potassium 4.2; Sodium 141    Lipid Panel    Component Value Date/Time   CHOL 277* 11/09/2014 0838   TRIG 186.0* 11/09/2014 0838   HDL 39.20 11/09/2014 0838   CHOLHDL 7 11/09/2014 0838   VLDL 37.2 11/09/2014 0838   LDLCALC 201* 11/09/2014 0838   LDLDIRECT 169.0 03/30/2013 0936      ASSESSMENT AND PLAN:  Pre-operative cardiovascular examination The patient does not have any unstable cardiac conditions.  Upon evaluation today, he can achieve 4 METs or greater without anginal symptoms.  He had normal coronary arteries at the time of his LHC prior to AV surgery.  According to [facility] and AHA guidelines, he requires no further cardiac workup prior to his noncardiac surgery and should be at acceptable risk.    Aortic insufficiency S/P Bioprosthetic AVR (aortic valve replacement) in 2011 Stable gradients by most recent echo.  Continue SBE prophylaxis.  Arrange FU echo  in 09/2015.  History of DVT (deep vein thrombosis) and Recurrent pulmonary embolism Continue Xarelto. This is managed by heme/onc.   Paroxysmal atrial fibrillation Maintaining NSR.  Continue Toprol-XL and Xarelto.  Essential hypertension Borderline control.  Continue to monitor.  HLD (hyperlipidemia) Managed by PCP.  Lung nodule  VATS planned at [facility] later this month.     Current medicines are reviewed at length with the patient today.  Concerns regarding medicines are as outlined above.  The following changes have been made:    None    Labs/ tests ordered today include:  No orders of the defined types were placed in this encounter.    Disposition:   FU with [name] 6 mos.   Signed, Versie Starks, MHS 03/22/2015 12:06 PM    [facility] [address], [place], [place]  [address] Phone: [phone]; Fax: [phone]

## 2015-03-30 ENCOUNTER — Ambulatory Visit (INDEPENDENT_AMBULATORY_CARE_PROVIDER_SITE_OTHER): Payer: 59 | Admitting: Psychology

## 2015-03-30 DIAGNOSIS — F4321 Adjustment disorder with depressed mood: Secondary | ICD-10-CM | POA: Diagnosis not present

## 2015-04-05 ENCOUNTER — Telehealth: Payer: Self-pay | Admitting: *Deleted

## 2015-04-05 NOTE — Telephone Encounter (Signed)
Patient discharged from [facility] yesterday after a thorasic biopsy. He is requesting to come into our office for incision assessment. Patient referred back to his surgeons office for assessment. Patient aware and will contact that office.

## 2015-04-13 ENCOUNTER — Ambulatory Visit (INDEPENDENT_AMBULATORY_CARE_PROVIDER_SITE_OTHER): Payer: 59 | Admitting: Psychology

## 2015-04-13 DIAGNOSIS — F4321 Adjustment disorder with depressed mood: Secondary | ICD-10-CM

## 2015-04-20 ENCOUNTER — Encounter: Payer: Self-pay | Admitting: Family Medicine

## 2015-04-27 ENCOUNTER — Ambulatory Visit (INDEPENDENT_AMBULATORY_CARE_PROVIDER_SITE_OTHER): Payer: 59 | Admitting: Psychology

## 2015-04-27 DIAGNOSIS — F4321 Adjustment disorder with depressed mood: Secondary | ICD-10-CM | POA: Diagnosis not present

## 2015-05-09 ENCOUNTER — Encounter: Payer: Self-pay | Admitting: Hematology & Oncology

## 2015-05-09 ENCOUNTER — Ambulatory Visit (HOSPITAL_BASED_OUTPATIENT_CLINIC_OR_DEPARTMENT_OTHER): Payer: Medicare Other | Admitting: Hematology & Oncology

## 2015-05-09 ENCOUNTER — Other Ambulatory Visit (HOSPITAL_BASED_OUTPATIENT_CLINIC_OR_DEPARTMENT_OTHER): Payer: Medicare Other

## 2015-05-09 VITALS — BP 127/85 | HR 65 | Temp 97.9°F | Resp 18 | Ht 70.0 in | Wt 199.0 lb

## 2015-05-09 DIAGNOSIS — Z7901 Long term (current) use of anticoagulants: Secondary | ICD-10-CM | POA: Diagnosis not present

## 2015-05-09 DIAGNOSIS — C9110 Chronic lymphocytic leukemia of B-cell type not having achieved remission: Secondary | ICD-10-CM

## 2015-05-09 DIAGNOSIS — R911 Solitary pulmonary nodule: Secondary | ICD-10-CM

## 2015-05-09 DIAGNOSIS — I2699 Other pulmonary embolism without acute cor pulmonale: Secondary | ICD-10-CM

## 2015-05-09 LAB — CBC WITH DIFFERENTIAL (CANCER CENTER ONLY)
BASO#: 0.2 10*3/uL (ref 0.0–0.2)
BASO%: 1.9 % (ref 0.0–2.0)
EOS%: 1.3 % (ref 0.0–7.0)
Eosinophils Absolute: 0.2 10*3/uL (ref 0.0–0.5)
HCT: 41.1 % (ref 38.7–49.9)
HGB: 14.1 g/dL (ref 13.0–17.1)
LYMPH#: 9 10*3/uL — ABNORMAL HIGH (ref 0.9–3.3)
LYMPH%: 74.1 % — ABNORMAL HIGH (ref 14.0–48.0)
MCH: 32.5 pg (ref 28.0–33.4)
MCHC: 34.3 g/dL (ref 32.0–35.9)
MCV: 95 fL (ref 82–98)
MONO#: 0.9 10*3/uL (ref 0.1–0.9)
MONO%: 7.6 % (ref 0.0–13.0)
NEUT#: 1.8 10*3/uL (ref 1.5–6.5)
NEUT%: 15.1 % — ABNORMAL LOW (ref 40.0–80.0)
Platelets: 128 10*3/uL — ABNORMAL LOW (ref 145–400)
RBC: 4.34 10*6/uL (ref 4.20–5.70)
RDW: 12.9 % (ref 11.1–15.7)
WBC: 12.1 10*3/uL — ABNORMAL HIGH (ref 4.0–10.0)

## 2015-05-09 LAB — COMPREHENSIVE METABOLIC PANEL
ALT: 19 U/L (ref 0–53)
AST: 17 U/L (ref 0–37)
Albumin: 4 g/dL (ref 3.5–5.2)
Alkaline Phosphatase: 56 U/L (ref 39–117)
BUN: 21 mg/dL (ref 6–23)
CO2: 27 mEq/L (ref 19–32)
Calcium: 9.1 mg/dL (ref 8.4–10.5)
Chloride: 104 mEq/L (ref 96–112)
Creatinine, Ser: 1.24 mg/dL (ref 0.50–1.35)
Glucose, Bld: 73 mg/dL (ref 70–99)
Potassium: 4.2 mEq/L (ref 3.5–5.3)
Sodium: 140 mEq/L (ref 135–145)
Total Bilirubin: 0.5 mg/dL (ref 0.2–1.2)
Total Protein: 6.2 g/dL (ref 6.0–8.3)

## 2015-05-09 LAB — TECHNOLOGIST REVIEW CHCC SATELLITE

## 2015-05-09 LAB — CHCC SATELLITE - SMEAR

## 2015-05-09 NOTE — Progress Notes (Signed)
Hematology and Oncology Follow Up Visit  [name] [id number] [birth year] 69 y.o. 05/09/2015   Principle Diagnosis:   Right lower lobe pulmonary nodule- non-malignant  Recurrent pulmonary embolism  CLL-stable  Current Therapy:    Xarelto 20 mg by mouth daily     Interim History:  Mr.  Bell is back for follow-up. He did go ahead and have surgery at [facility]. He had laparoscopic surgery. Thank you, the pathology did not show any malignancy. He had necrotizing granuloma. There is no low-grade malignancy. Actually, the surgery was done overnight. He did not even stay in the hospital.  He's having a little bit of referred pain over on his right chest wall. His main problem, however, has been abdominal pain. He's having some dyspepsia. I told him to try some Pepto Ismol. He sees his family doctor tomorrow.  He's had no bleeding. He was off Xarelto for his surgery but did not have any problems being off Xarelto.  His CLL has not caused any issues for him.  His appetite is better. He's had no change in bowel or bladder habits.  He is back exercising. He just looks tremendous.   Medications:  Current outpatient prescriptions:  .  imipramine (TOFRANIL) 10 MG tablet, 1 tab po qhs for insomnia, Disp: 30 tablet, Rfl: 6 .  LORazepam (ATIVAN) 0.5 MG tablet, Take 1 tablet (0.5 mg total) by mouth daily as needed for anxiety., Disp: 30 tablet, Rfl: 5 .  metoprolol succinate (TOPROL-XL) 25 MG 24 hr tablet, TAKE 1 TABLET BY MOUTH EVERY DAY, Disp: 30 tablet, Rfl: 2 .  PARoxetine (PAXIL) 20 MG tablet, Take 1 tablet (20 mg total) by mouth daily., Disp: 30 tablet, Rfl: 5 .  rivaroxaban (XARELTO) 20 MG TABS tablet, Take 1 tablet (20 mg total) by mouth daily with supper., Disp: 30 tablet, Rfl: 6 .  amoxicillin (AMOXIL) 500 MG capsule, Take 500 mg by mouth 2 (two) times daily. FOR DENTAL PROCEDURES, Disp: , Rfl:   Allergies:  Allergies  Allergen Reactions  . Hydrocodone-Acetaminophen Shortness Of  Breath  . Oxycodone Hcl Shortness Of Breath  . Telmisartan-Hctz Shortness Of Breath and Palpitations    Dizziness, too  . Ramipril   . Aspirin Other (See Comments) and Rash    Other Reaction: confusion Can't take high dose  . Atorvastatin Other (See Comments)    myalgia  . Buspirone Hcl Other (See Comments)    headache  . Codeine Nausea Only and Other (See Comments)    Other Reaction: confusion  . Codeine Phosphate Itching and Nausea Only  . Hydrocodone-Acetaminophen Nausea Only and Other (See Comments)    Other Reaction: confusion  . Morphine Sulfate Itching  . Rabeprazole Other (See Comments)    headache    Past Medical History, Surgical history, Social history, and Family History were reviewed and updated.  Review of Systems: As above  Physical Exam:  height is 5\' 10"  (1.778 m) and weight is 199 lb (90.266 kg). His oral temperature is 97.9 F (36.6 C). His blood pressure is 127/85 and his pulse is 65. His respiration is 18.   Well-developed and well-nourished African-American gentleman. There are no ocular or oral lesions. He has no palpable cervical or supraclavicular lymph nodes. Lungs are clear. Has a rales, wheezes or rhonchi. Cardiac exam regular rate and rhythm with no murmurs, rubs or bruits. Abdomen is soft. Has good bowel sounds. There is no fluid wave. There is no palpable liver or spleen tip. Back exam shows a  well-healed thoracoscopy scar in the right lateral chest wall. Where he had the chest tube in place just inferior to the thoracoscopy scar is also healing nicely. There is no erythema or warmth over the thoracoscopy site. no tenderness over the spine, ribs or hips. Extremities shows no clubbing, cyanosis or edema. No venous cord is palpable is legs. He has a negative Homans sign. Skin exam shows no rashes, ecchymoses or petechia. Neurological exam is nonfocal.  Lab Results  Component Value Date   WBC 12.1* 05/09/2015   HGB 14.1 05/09/2015   HCT 41.1 05/09/2015     MCV 95 05/09/2015   PLT 128* 05/09/2015     Chemistry      Component Value Date/Time   NA 141 02/06/2015 1421   NA 137 11/03/2014 0507   K 4.2 02/06/2015 1421   K 3.8 11/03/2014 0507   CL 107 02/06/2015 1421   CL 104 11/03/2014 0507   CO2 28 02/06/2015 1421   CO2 26 11/03/2014 0507   BUN 16 02/06/2015 1421   BUN 13 11/03/2014 0507   CREATININE 1.5* 02/06/2015 1421   CREATININE 1.13 11/03/2014 0507      Component Value Date/Time   CALCIUM 9.1 02/06/2015 1421   CALCIUM 8.7 11/03/2014 0507   ALKPHOS 71 02/06/2015 1421   ALKPHOS 62 04/11/2014 0915   AST 28 02/06/2015 1421   AST 32 04/11/2014 0915   ALT 28 02/06/2015 1421   ALT 36 04/11/2014 0915   BILITOT 0.50 02/06/2015 1421   BILITOT 0.8 04/11/2014 0915         Impression and Plan: [name] is 69 year old African-American gentleman. He has recurrent pulmonary embolism. I think he needs lifelong anticoagulation.  Of note, he does have a tissue prosthetic aortic valve. This was placed about 4 years ago at [facility]. He's had no problems with this.  A again, there was no problems with malignancy with this lung nodule. I'm just thankful that he had the surgery and he feels much better having the surgery and finding out that there is no cancer.  I really think that we can see him back in 6 months now.  A repeat CT angiogram done back in March did not show any active pulmonary emboli.   I spent about 35 minutes with him. Volanda Napoleon, MD 6/28/201611:08 AM

## 2015-05-10 ENCOUNTER — Ambulatory Visit (INDEPENDENT_AMBULATORY_CARE_PROVIDER_SITE_OTHER): Payer: Medicare Other | Admitting: Family Medicine

## 2015-05-10 ENCOUNTER — Encounter: Payer: Self-pay | Admitting: Family Medicine

## 2015-05-10 VITALS — BP 135/87 | HR 66 | Temp 98.0°F | Resp 16 | Ht 70.0 in | Wt 199.0 lb

## 2015-05-10 DIAGNOSIS — G8929 Other chronic pain: Secondary | ICD-10-CM

## 2015-05-10 DIAGNOSIS — R1031 Right lower quadrant pain: Secondary | ICD-10-CM

## 2015-05-10 NOTE — Patient Instructions (Signed)
CT Abdomin/Pelvis with Contrast   [facility] Apt: 05/12/15 at 2:20pm need to arrive at 2:00pm  No Solid foods after 10:20am  Any Liquids or meds are fine.  Drink one bottle of contrast at 12:20pm and 1:20pm.

## 2015-05-10 NOTE — Progress Notes (Signed)
OFFICE NOTE  05/10/2015  CC:  Chief Complaint  Patient presents with  . Follow-up    Anxiety meds  . Abdominal Pain    x 2-3 weeks   HPI: Patient is a 69 y.o. African-American male who is here for "discuss meds", 3 mo f/u anxiety.   He got lung nodule excised last month (04/03/15) and this showed a benign lesion. At f/u visit his R lower chest wall and lower abdominal area was also sore.  Says he had no dysphagia or nausea but had regurgitation of food regularly.  Started taking prilosec daily and OTC gas med OTC and these are helping only a little bit.  Since surgery, after initial constipation assoc with pain meds, he says his bm's have occurred regularly and appear normal. The abd pain waxes and wanes in intensity, does not radiate.  No urinary complaints.   Eating sometimes makes it worse but this is not consistent.  No fevers.  Anxiety: says he feels a tingling sensation in right hand and right foot when he gets very anxious.  This occurs in conjunction with periods of increased breathing rate/hyperventilation assoc with his anxiousness.   Pertinent PMH:  Past medical, surgical, social, and family history reviewed and no changes are noted since last office visit.  MEDS:  Outpatient Prescriptions Prior to Visit  Medication Sig Dispense Refill  . imipramine (TOFRANIL) 10 MG tablet 1 tab po qhs for insomnia 30 tablet 6  . LORazepam (ATIVAN) 0.5 MG tablet Take 1 tablet (0.5 mg total) by mouth daily as needed for anxiety. 30 tablet 5  . metoprolol succinate (TOPROL-XL) 25 MG 24 hr tablet TAKE 1 TABLET BY MOUTH EVERY DAY 30 tablet 2  . PARoxetine (PAXIL) 20 MG tablet Take 1 tablet (20 mg total) by mouth daily. 30 tablet 5  . rivaroxaban (XARELTO) 20 MG TABS tablet Take 1 tablet (20 mg total) by mouth daily with supper. 30 tablet 6  . amoxicillin (AMOXIL) 500 MG capsule Take 500 mg by mouth 2 (two) times daily. FOR DENTAL PROCEDURES     No facility-administered medications prior to  visit.    PE: Blood pressure 135/87, pulse 66, temperature 98 F (36.7 C), temperature source Oral, resp. rate 16, height 5\' 10"  (1.778 m), weight 199 lb (90.266 kg), SpO2 99 %. Gen: Alert, well appearing.  Patient is oriented to person, place, time, and situation. AFFECT: pleasant, lucid thought and speech. YIF:OYDX: no injection, icteris, swelling, or exudate.  EOMI, PERRLA. Mouth: lips without lesion/swelling.  Oral mucosa pink and moist. Oropharynx without erythema, exudate, or swelling.  CV: RRR LUNGS: CTA bilat, nonlabored resps, good aeration in all lung fields. ABD: BS normal, no distention, mild/mod TTP in RLQ without guarding or rebound, similar tenderness in mid lower abd but not LLQ.  No mass or HSM. EXT: no clubbing, cyanosis, or edema.   LABS: Lab Results  Component Value Date   TSH 1.93 04/11/2014   Lab Results  Component Value Date   WBC 12.1* 05/09/2015   HGB 14.1 05/09/2015   HCT 41.1 05/09/2015   MCV 95 05/09/2015   PLT 128* 05/09/2015   Lab Results  Component Value Date   CREATININE 1.24 05/09/2015   BUN 21 05/09/2015   NA 140 05/09/2015   K 4.2 05/09/2015   CL 104 05/09/2015   CO2 27 05/09/2015   Lab Results  Component Value Date   ALT 19 05/09/2015   AST 17 05/09/2015   ALKPHOS 56 05/09/2015   BILITOT  0.5 05/09/2015   Lab Results  Component Value Date   CHOL 277* 11/09/2014   Lab Results  Component Value Date   HDL 39.20 11/09/2014   Lab Results  Component Value Date   LDLCALC 201* 11/09/2014   Lab Results  Component Value Date   TRIG 186.0* 11/09/2014   Lab Results  Component Value Date   CHOLHDL 7 11/09/2014   Lab Results  Component Value Date   PSA 0.01* 11/16/2014   PSA 0.00* 03/30/2013   PSA 0.00 Repeated and verified X2.* 03/22/2011    IMPRESSION AND PLAN:  1) Anxiety, with hyperventilatioin and subsequent extremity tingling. Reassured pt, and he said he'll try some breathing exercises to try to help this problem.   No change in meds.  2) 1 mo RLQ pain, waxing/waning, also with some upper GI sx's: all of this is s/p general anesthesia for a lung mass excision (benign). Possibly generalized anesthesia effects on bowel--prolonged. However, will check CT abd/pelv to eval colon and appendix.  No new meds today.  May continue PPI and gas-ex.  An After Visit Summary was printed and given to the patient.  FOLLOW UP: To be determined based on results of pending workup.

## 2015-05-10 NOTE — Progress Notes (Signed)
Pre visit review using our clinic review tool, if applicable. No additional management support is needed unless otherwise documented below in the visit note. 

## 2015-05-11 ENCOUNTER — Ambulatory Visit (INDEPENDENT_AMBULATORY_CARE_PROVIDER_SITE_OTHER): Payer: 59 | Admitting: Psychology

## 2015-05-11 DIAGNOSIS — F4321 Adjustment disorder with depressed mood: Secondary | ICD-10-CM

## 2015-05-12 ENCOUNTER — Inpatient Hospital Stay: Admission: RE | Admit: 2015-05-12 | Payer: Self-pay | Source: Ambulatory Visit

## 2015-05-17 ENCOUNTER — Ambulatory Visit
Admission: RE | Admit: 2015-05-17 | Discharge: 2015-05-17 | Disposition: A | Discharge status: Home / Self Care | Payer: Medicare Other | Source: Ambulatory Visit | Attending: Family Medicine | Admitting: Family Medicine

## 2015-05-17 DIAGNOSIS — G8929 Other chronic pain: Secondary | ICD-10-CM

## 2015-05-17 DIAGNOSIS — R1031 Right lower quadrant pain: Principal | ICD-10-CM

## 2015-05-17 MED ORDER — IOPAMIDOL (ISOVUE-300) INJECTION 61%
100.0000 mL | Freq: Once | INTRAVENOUS | Status: AC | PRN
  Administered 2015-05-17: 100 mL via INTRAVENOUS

## 2015-05-23 ENCOUNTER — Encounter: Payer: Self-pay | Admitting: Family Medicine

## 2015-05-23 ENCOUNTER — Ambulatory Visit (INDEPENDENT_AMBULATORY_CARE_PROVIDER_SITE_OTHER): Payer: Medicare Other | Admitting: Family Medicine

## 2015-05-23 VITALS — BP 132/85 | HR 62 | Temp 97.9°F | Resp 16 | Ht 70.0 in | Wt 198.0 lb

## 2015-05-23 DIAGNOSIS — F411 Generalized anxiety disorder: Secondary | ICD-10-CM | POA: Diagnosis not present

## 2015-05-23 DIAGNOSIS — K5900 Constipation, unspecified: Secondary | ICD-10-CM

## 2015-05-23 NOTE — Progress Notes (Signed)
OFFICE NOTE  05/23/2015  CC:  Chief Complaint  Patient presents with  . Follow-up   HPI: Patient is a 69 y.o. African-American male who is here for f/u recent abd complaints. His CT showed only constipation.  The oral prep for the CT elicited several large BMs and he has felt fine ever since. No further episodes of hands/feet tingling. No new complaints: we did discuss some basic dietary and OTC med options to help with chronic constipation.  Pertinent PMH:  Past medical, surgical, social, and family history reviewed and no changes are noted since last office visit.  MEDS:  Outpatient Prescriptions Prior to Visit  Medication Sig Dispense Refill  . amoxicillin (AMOXIL) 500 MG capsule Take 500 mg by mouth 2 (two) times daily. FOR DENTAL PROCEDURES    . imipramine (TOFRANIL) 10 MG tablet 1 tab po qhs for insomnia 30 tablet 6  . LORazepam (ATIVAN) 0.5 MG tablet Take 1 tablet (0.5 mg total) by mouth daily as needed for anxiety. 30 tablet 5  . metoprolol succinate (TOPROL-XL) 25 MG 24 hr tablet TAKE 1 TABLET BY MOUTH EVERY DAY 30 tablet 2  . PARoxetine (PAXIL) 20 MG tablet Take 1 tablet (20 mg total) by mouth daily. 30 tablet 5  . rivaroxaban (XARELTO) 20 MG TABS tablet Take 1 tablet (20 mg total) by mouth daily with supper. 30 tablet 6   No facility-administered medications prior to visit.    PE: Blood pressure 132/85, pulse 62, temperature 97.9 F (36.6 C), temperature source Temporal, resp. rate 16, height 5\' 10"  (1.778 m), weight 198 lb (89.812 kg), SpO2 96 %. Gen: Alert, well appearing.  Patient is oriented to person, place, time, and situation. AFFECT: pleasant, lucid thought and speech.  No further exam today.  LABS:  Lab Results  Component Value Date   TSH 1.93 04/11/2014   Lab Results  Component Value Date   WBC 12.1* 05/09/2015   HGB 14.1 05/09/2015   HCT 41.1 05/09/2015   MCV 95 05/09/2015   PLT 128* 05/09/2015   Lab Results  Component Value Date   CREATININE  1.24 05/09/2015   BUN 21 05/09/2015   NA 140 05/09/2015   K 4.2 05/09/2015   CL 104 05/09/2015   CO2 27 05/09/2015   Lab Results  Component Value Date   ALT 19 05/09/2015   AST 17 05/09/2015   ALKPHOS 56 05/09/2015   BILITOT 0.5 05/09/2015   Lab Results  Component Value Date   CHOL 277* 11/09/2014   Lab Results  Component Value Date   HDL 39.20 11/09/2014   Lab Results  Component Value Date   LDLCALC 201* 11/09/2014   Lab Results  Component Value Date   TRIG 186.0* 11/09/2014   Lab Results  Component Value Date   CHOLHDL 7 11/09/2014   Lab Results  Component Value Date   PSA 0.01* 11/16/2014   PSA 0.00* 03/30/2013   PSA 0.00 Repeated and verified X2.* 03/22/2011    IMPRESSION AND PLAN:  1) Constipation: essentially resolved.  Discussed use of OTC senakot S prn or scheduled, try OTC fiber supplement like metamucil.  He says his diet is already rich in foods that would be helpful for constipation.  2) Anxiety; The current medical regimen is partially effective and at this point he is happy with it;  continue present plan and medications.  An After Visit Summary was printed and given to the patient.  FOLLOW UP: 6 mo, needs DRE and PSA at that time.

## 2015-05-23 NOTE — Progress Notes (Signed)
Pre visit review using our clinic review tool, if applicable. No additional management support is needed unless otherwise documented below in the visit note. 

## 2015-05-25 ENCOUNTER — Ambulatory Visit (INDEPENDENT_AMBULATORY_CARE_PROVIDER_SITE_OTHER): Payer: 59 | Admitting: Psychology

## 2015-05-25 DIAGNOSIS — F4321 Adjustment disorder with depressed mood: Secondary | ICD-10-CM

## 2015-06-07 ENCOUNTER — Ambulatory Visit: Payer: Self-pay | Admitting: Family Medicine

## 2015-06-07 ENCOUNTER — Ambulatory Visit: Payer: Self-pay | Admitting: Cardiovascular Disease

## 2015-06-08 ENCOUNTER — Ambulatory Visit (INDEPENDENT_AMBULATORY_CARE_PROVIDER_SITE_OTHER): Payer: 59 | Admitting: Psychology

## 2015-06-08 DIAGNOSIS — F4321 Adjustment disorder with depressed mood: Secondary | ICD-10-CM

## 2015-06-22 ENCOUNTER — Ambulatory Visit (INDEPENDENT_AMBULATORY_CARE_PROVIDER_SITE_OTHER): Payer: 59 | Admitting: Psychology

## 2015-06-22 DIAGNOSIS — F4321 Adjustment disorder with depressed mood: Secondary | ICD-10-CM

## 2015-06-27 ENCOUNTER — Other Ambulatory Visit: Payer: Self-pay | Admitting: *Deleted

## 2015-06-27 MED ORDER — IMIPRAMINE HCL 10 MG PO TABS: ORAL_TABLET | ORAL | Status: DC

## 2015-06-27 NOTE — Telephone Encounter (Signed)
Pt LMOM on 06/27/15 at 9:37am RF request for imparmine LOV: 05/23/15 Next ov: None Last written: 01/23/15 #30 w/ 6RF Please advise. Thanks.

## 2015-06-30 ENCOUNTER — Other Ambulatory Visit: Payer: Self-pay | Admitting: *Deleted

## 2015-06-30 MED ORDER — RIVAROXABAN 20 MG PO TABS: 20.0000 mg | ORAL_TABLET | Freq: Every day | ORAL | Status: DC

## 2015-06-30 NOTE — Telephone Encounter (Signed)
RF request for xarelto LOV: 05/23/15 Next ov:  11/15/15 Last written: 11/24/14 #30 w/ 6RF

## 2015-07-05 ENCOUNTER — Encounter: Payer: Self-pay | Admitting: Family Medicine

## 2015-07-05 ENCOUNTER — Ambulatory Visit (INDEPENDENT_AMBULATORY_CARE_PROVIDER_SITE_OTHER): Payer: Medicare Other | Admitting: Family Medicine

## 2015-07-05 VITALS — BP 118/80 | HR 69 | Temp 98.0°F | Resp 16 | Ht 70.0 in | Wt 202.0 lb

## 2015-07-05 DIAGNOSIS — F411 Generalized anxiety disorder: Secondary | ICD-10-CM | POA: Diagnosis not present

## 2015-07-05 DIAGNOSIS — F410 Panic disorder [episodic paroxysmal anxiety] without agoraphobia: Secondary | ICD-10-CM | POA: Diagnosis not present

## 2015-07-05 MED ORDER — PAROXETINE HCL 30 MG PO TABS: 30.0000 mg | ORAL_TABLET | Freq: Every day | ORAL | Status: DC

## 2015-07-05 NOTE — Progress Notes (Signed)
Pre visit review using our clinic review tool, if applicable. No additional management support is needed unless otherwise documented below in the visit note. 

## 2015-07-05 NOTE — Progress Notes (Signed)
OFFICE NOTE  07/05/2015  CC:  Chief Complaint  Patient presents with  . Panic Attack   HPI: Patient is a 69 y.o. African-American male who is here for anxiety. Gets panicky, very anxious much more lately b/c he frequently feels R leg "like it doesn't work". He can shake it and it begins to feel normal again.  He gets some SOB feeling for a few minutes.   Each time this lasts about 1 min, now fearful that it is going to happen again.  It is exhausting him.  He has no problem with physical exertion: hikes 3-4 miles w/out problems on many occasions.    Pertinent PMH:  Past medical, surgical, social, and family history reviewed and no changes are noted since last office visit.  MEDS:  Outpatient Prescriptions Prior to Visit  Medication Sig Dispense Refill  . imipramine (TOFRANIL) 10 MG tablet 1 tab po qhs for insomnia 30 tablet 11  . LORazepam (ATIVAN) 0.5 MG tablet Take 1 tablet (0.5 mg total) by mouth daily as needed for anxiety. 30 tablet 5  . metoprolol succinate (TOPROL-XL) 25 MG 24 hr tablet TAKE 1 TABLET BY MOUTH EVERY DAY 30 tablet 2  . PARoxetine (PAXIL) 20 MG tablet Take 1 tablet (20 mg total) by mouth daily. 30 tablet 5  . rivaroxaban (XARELTO) 20 MG TABS tablet Take 1 tablet (20 mg total) by mouth daily with supper. 30 tablet 6  . amoxicillin (AMOXIL) 500 MG capsule Take 500 mg by mouth 2 (two) times daily. FOR DENTAL PROCEDURES     No facility-administered medications prior to visit.    PE: Blood pressure 118/80, pulse 69, temperature 98 F (36.7 C), temperature source Oral, resp. rate 16, height 5\' 10"  (1.778 m), weight 202 lb (91.627 kg), SpO2 98 %. Gen: Alert, well appearing.  Patient is oriented to person, place, time, and situation. AFFECT: pleasant, lucid thought and speech. CV: RRR, no m/r/g.   LUNGS: CTA bilat, nonlabored resps, good aeration in all lung fields. EXT: no clubbing, cyanosis, or edema.    IMPRESSION AND PLAN:  GAD with panic disorder:  worsening.  Increase paxil to 30mg  qd. Therapeutic expectations and side effect profile of medication discussed today.  Patient's questions answered.  An After Visit Summary was printed and given to the patient.  FOLLOW UP: 1 mo

## 2015-07-10 ENCOUNTER — Other Ambulatory Visit: Payer: Self-pay | Admitting: *Deleted

## 2015-07-10 MED ORDER — LORAZEPAM 0.5 MG PO TABS: 0.5000 mg | ORAL_TABLET | Freq: Every day | ORAL | Status: DC | PRN

## 2015-07-10 NOTE — Telephone Encounter (Signed)
RF request for lorazepam LOV: 07/05/15 Next ov: 11/15/15 Last written: 12/05/14 #30 w/ 5RF Please advise. Thanks.

## 2015-07-20 ENCOUNTER — Ambulatory Visit (INDEPENDENT_AMBULATORY_CARE_PROVIDER_SITE_OTHER): Payer: 59 | Admitting: Psychology

## 2015-07-20 DIAGNOSIS — F4321 Adjustment disorder with depressed mood: Secondary | ICD-10-CM | POA: Diagnosis not present

## 2015-08-02 ENCOUNTER — Ambulatory Visit (INDEPENDENT_AMBULATORY_CARE_PROVIDER_SITE_OTHER): Payer: Medicare Other | Admitting: Family Medicine

## 2015-08-02 ENCOUNTER — Encounter: Payer: Self-pay | Admitting: Family Medicine

## 2015-08-02 VITALS — BP 134/88 | HR 62 | Temp 98.1°F | Resp 16 | Ht 70.0 in | Wt 202.0 lb

## 2015-08-02 DIAGNOSIS — Z23 Encounter for immunization: Secondary | ICD-10-CM

## 2015-08-02 DIAGNOSIS — F411 Generalized anxiety disorder: Secondary | ICD-10-CM | POA: Diagnosis not present

## 2015-08-02 DIAGNOSIS — F410 Panic disorder [episodic paroxysmal anxiety] without agoraphobia: Secondary | ICD-10-CM | POA: Diagnosis not present

## 2015-08-02 NOTE — Progress Notes (Signed)
Pre visit review using our clinic review tool, if applicable. No additional management support is needed unless otherwise documented below in the visit note. 

## 2015-08-02 NOTE — Progress Notes (Signed)
OFFICE NOTE  08/02/2015  CC:  Chief Complaint  Patient presents with  . Follow-up    Anxiety and Panic Attacks     HPI: Patient is a 69 y.o. African-American male who is here for 1 mo f/u anxiety/panic. Feels improved re: panic/anxiety on 30mg  paxil dosing since last visit. Still with some panic periods but fewer and less severe.  Working with Dr. Cheryln Bell on coping, learning to accept that he cannot control these to a large degree.   Pertinent PMH:  Past medical, surgical, social, and family history reviewed and no changes are noted since last office visit.  MEDS:  Outpatient Prescriptions Prior to Visit  Medication Sig Dispense Refill  . imipramine (TOFRANIL) 10 MG tablet 1 tab po qhs for insomnia 30 tablet 11  . LORazepam (ATIVAN) 0.5 MG tablet Take 1 tablet (0.5 mg total) by mouth daily as needed for anxiety. 30 tablet 5  . metoprolol succinate (TOPROL-XL) 25 MG 24 hr tablet TAKE 1 TABLET BY MOUTH EVERY DAY 30 tablet 2  . NEOMYCIN-POLYMYXIN-HYDROCORTISONE (CORTISPORIN) 1 % SOLN otic solution Place 3 drops into the right ear 3 (three) times daily.  0  . PARoxetine (PAXIL) 30 MG tablet Take 1 tablet (30 mg total) by mouth daily. 30 tablet 6  . rivaroxaban (XARELTO) 20 MG TABS tablet Take 1 tablet (20 mg total) by mouth daily with supper. 30 tablet 6   No facility-administered medications prior to visit.    PE: Blood pressure 134/88, pulse 62, temperature 98.1 F (36.7 C), temperature source Oral, resp. rate 16, height 5\' 10"  (1.778 m), weight 202 lb (91.627 kg), SpO2 99 %. Gen: Alert, well appearing.  Patient is oriented to person, place, time, and situation. AFFECT: pleasant, lucid thought and speech. No further exam today.  IMPRESSION AND PLAN:  Panic disorder: improved on 30mg  paxil dosing.  Continue this med + counseling with Dr. Cheryln Bell. Flu vaccine (regular dose) given today.  An After Visit Summary was printed and given to the patient.  FOLLOW UP: 6 mo

## 2015-08-02 NOTE — Addendum Note (Signed)
Addended by: Lanae Crumbly on: 08/02/2015 01:30 PM   Modules accepted: Orders

## 2015-08-03 ENCOUNTER — Ambulatory Visit (INDEPENDENT_AMBULATORY_CARE_PROVIDER_SITE_OTHER): Payer: 59 | Admitting: Psychology

## 2015-08-03 DIAGNOSIS — F4321 Adjustment disorder with depressed mood: Secondary | ICD-10-CM | POA: Diagnosis not present

## 2015-08-12 HISTORY — PX: OTHER SURGICAL HISTORY: SHX169

## 2015-08-15 ENCOUNTER — Telehealth: Payer: Self-pay | Admitting: Family Medicine

## 2015-08-15 NOTE — Telephone Encounter (Signed)
Pls call pt and tell him it is more likely his anxiety making him feel those symptoms than it is the medication. If he is adamant about the sx's being from his paxil (paroxetine) then tell him to split his pill in half for a week or so and see if his symptoms go away.  -thx

## 2015-08-15 NOTE — Telephone Encounter (Signed)
Please advise. Thanks.  

## 2015-08-15 NOTE — Telephone Encounter (Signed)
Patient feels like he is having a reaction to his medication. He asked that you look at his chart since it's been noted in the past. He describes his reaction as a tingling feeling in his abdominal area & he feels like his motor skills are off. Please call him.

## 2015-08-15 NOTE — Telephone Encounter (Signed)
Left message for pt to call back  °

## 2015-08-15 NOTE — Telephone Encounter (Signed)
Pt advised and voiced understanding.   

## 2015-08-16 NOTE — Telephone Encounter (Signed)
Patient left VM on my phone for you to call him back. No reason given.

## 2015-08-17 ENCOUNTER — Ambulatory Visit (INDEPENDENT_AMBULATORY_CARE_PROVIDER_SITE_OTHER): Payer: 59 | Admitting: Psychology

## 2015-08-17 DIAGNOSIS — F4321 Adjustment disorder with depressed mood: Secondary | ICD-10-CM

## 2015-08-17 NOTE — Telephone Encounter (Signed)
Old vm pt has already been advised.

## 2015-08-21 ENCOUNTER — Other Ambulatory Visit: Payer: Self-pay | Admitting: Cardiovascular Disease

## 2015-08-23 ENCOUNTER — Encounter: Payer: Self-pay | Admitting: Family Medicine

## 2015-08-23 ENCOUNTER — Ambulatory Visit (INDEPENDENT_AMBULATORY_CARE_PROVIDER_SITE_OTHER): Payer: Medicare Other | Admitting: Family Medicine

## 2015-08-23 VITALS — BP 133/89 | HR 64 | Temp 98.4°F | Resp 16 | Ht 70.0 in | Wt 201.0 lb

## 2015-08-23 DIAGNOSIS — F411 Generalized anxiety disorder: Secondary | ICD-10-CM

## 2015-08-23 DIAGNOSIS — G459 Transient cerebral ischemic attack, unspecified: Secondary | ICD-10-CM

## 2015-08-23 DIAGNOSIS — R202 Paresthesia of skin: Secondary | ICD-10-CM | POA: Diagnosis not present

## 2015-08-23 DIAGNOSIS — R002 Palpitations: Secondary | ICD-10-CM | POA: Diagnosis not present

## 2015-08-23 NOTE — Progress Notes (Signed)
Pre visit review using our clinic review tool, if applicable. No additional management support is needed unless otherwise documented below in the visit note. 

## 2015-08-23 NOTE — Progress Notes (Signed)
OFFICE VISIT  08/23/2015   CC:  Chief Complaint  Patient presents with  . Follow-up    Anxiety   HPI:    Patient is a 69 y.o. African-American male who presents for f/u anxiety and discuss meds. I last saw him 3 wks ago and he was improved after a move up to 30mg  qd paxil.    However, he continues to complain of recurrent odd neuro symptoms that he says started after getting on paxil and seem to get more frequent/persistent with higher doses of paxil. Recent cut back on dose to 1/2 of 30mg  tab of paxil has resulted in the sx's occurring less frequently and for shorter duration. He does feel a sense of panic/anxiety prior to onset of the symptoms, but he is generally a very anxious person overall, esp when it comes to his health, so we have been struggling with whether or not to look into his symptoms as possibly being caused by something other than his paxil.  Description of sx's is:  Out ot fhe blue it will be as if he cannot feel his R arm, with similar but less intense feeling in R foot, plus R lower facial tingling.  He says "it feels like my arm is there but it is completely dissassociated from my brain and my body".  Has some mild SOB with these sx's sometimes but not every time, and only rarely a palpitation feeling.  NO dizziness.  No dysphagia, no difficulty speaking, no vision loss.  Denies any distinct feeling of anything being focally weak.  Typical length of episode is about 1 minute. Some days this constellation of sx's occurs several times and sometimes he can go 4-5 days between episodes.     Past Medical History  Diagnosis Date  . Hypertension   . Hyperlipidemia   . PAF (paroxysmal atrial fibrillation) ([place])   . Mitral regurgitation   . History of pulmonary embolism 2011; 10/2014    Recurrence off of anticoagulation 10/2014: per hem/onc ([name]) pt needs lifelong anticoagulation (hypercoag w/u neg).  . Aortic regurgitation 04/2007 surgery    Resolved with tissue  AVR at [facility]  . History of prostate cancer 2003    Rad prost  . Anticoagulants causing adverse effect in therapeutic use   . Migraine syndrome   . Cervical spondylosis     ESI by [name] in Ortho  . Chronic lymphocytic leukemia (CLL), B-cell ([place]) approx 2012  . Pulmonary nodule, right 2015    RLL: resected at [facility] --VATS; Benign RLL nodule (fungal and AFB stains neg.  Plan is for [facility] thoracic surg to do f/u o/v & CT chest 1 yr   . Anxiety   . Panic attacks     Past Surgical History  Procedure Laterality Date  . Cardiac catheterization  04/2010    Normal coronaries.  . Penile prosthesis implant    . Prostatectomy  04/2002    for prostate cancer  . Cataract extraction      L 12/06/09   R 09/14/09  . Aortic valve replacement  2011    [facility]   . Colonoscopy  01/26/13    tic's, o/w normal.  ([facility])--recall 10 yrs.  . Transthoracic echocardiogram  09/20/2014    mild LVH, EF 50-55%, wall motion nl, grade I diast dysfxn, prosth aort valve good,transaortic gradients decreased compared to echo 11/2013.     Outpatient Prescriptions Prior to Visit  Medication Sig Dispense Refill  . imipramine (TOFRANIL) 10 MG tablet 1  tab po qhs for insomnia 30 tablet 11  . LORazepam (ATIVAN) 0.5 MG tablet Take 1 tablet (0.5 mg total) by mouth daily as needed for anxiety. 30 tablet 5  . metoprolol succinate (TOPROL-XL) 25 MG 24 hr tablet TAKE 1 TABLET BY MOUTH EVERY DAY 30 tablet 1  . PARoxetine (PAXIL) 30 MG tablet Take 1 tablet (30 mg total) by mouth daily. 30 tablet 6  . rivaroxaban (XARELTO) 20 MG TABS tablet Take 1 tablet (20 mg total) by mouth daily with supper. 30 tablet 6  . NEOMYCIN-POLYMYXIN-HYDROCORTISONE (CORTISPORIN) 1 % SOLN otic solution Place 3 drops into the right ear 3 (three) times daily.  0   No facility-administered medications prior to visit.    Allergies  Allergen Reactions  . Hydrocodone-Acetaminophen Shortness Of Breath  . Oxycodone Hcl Shortness Of Breath  . Telmisartan-Hctz  Shortness Of Breath and Palpitations    Dizziness, too  . Ramipril   . Aspirin Other (See Comments) and Rash    Other Reaction: confusion Can't take high dose  . Atorvastatin Other (See Comments)    myalgia  . Buspirone Hcl Other (See Comments)    headache  . Codeine Nausea Only and Other (See Comments)    Other Reaction: confusion  . Codeine Phosphate Itching and Nausea Only  . Hydrocodone-Acetaminophen Nausea Only and Other (See Comments)    Other Reaction: confusion  . Morphine Sulfate Itching  . Rabeprazole Other (See Comments)    headache    ROS As per HPI  PE: Blood pressure 133/89, pulse 64, temperature 98.4 F (36.9 C), temperature source Oral, resp. rate 16, height 5\' 10"  (1.778 m), weight 201 lb (91.173 kg), SpO2 96 %. Gen: Alert, well appearing.  Patient is oriented to person, place, time, and situation. HQP:RFFM: no injection, icteris, swelling, or exudate.  EOMI, PERRLA. Mouth: lips without lesion/swelling.  Oral mucosa pink and moist. Oropharynx without erythema, exudate, or swelling.  NECK: carotids 1+ bilat, without bruits CV: RRR, no m/r/g.   LUNGS: CTA bilat, nonlabored resps, good aeration in all lung fields. Neuro: CN 2-12 intact bilaterally, strength 5/5 in proximal and distal upper extremities and lower extremities bilaterally.  No sensory deficits.  No tremor.  No disdiadochokinesis.  No ataxia.  Upper extremity and lower extremity DTRs symmetric--no hyper-reflexia or clonus.  No pronator drift.  LABS:  None today  IMPRESSION AND PLAN:  1) Paresthesias/numbness: hard for patient to describe. Possible connection to paxil (?side effect vs atypical sx's of serotonin syndrome). I think he should stay on 1/2 of 30mg  paxil dosing b/c he admits the med has helped him overall with his GAD and panic. However, we should continue to look into these sx's as possibly being TIA's. No neuro-imaging at this time since exam normal and not currently having  symptoms.  Will check carotid dopplers and will get 48h Holter monitoring to see if PAF may be occuring. We had discussion today about emboli/thrombosis being less likely given the fact he is already taking xarelto, but given the situation I think it is best to proceed with this work up and also ask neurology to see him for their input.  An After Visit Summary was printed and given to the patient.  FOLLOW UP: Return for f/u to be determined based on results of w/u.

## 2015-08-25 ENCOUNTER — Telehealth: Payer: Self-pay | Admitting: Family Medicine

## 2015-08-25 NOTE — Telephone Encounter (Signed)
Pt would like to transfer back to [place] location and see dr Retail banker. Can I sch?

## 2015-08-28 ENCOUNTER — Telehealth: Payer: Self-pay | Admitting: Family Medicine

## 2015-08-28 NOTE — Telephone Encounter (Signed)
Pt LMOM stating that he decided to stop paxil and since then he has not had any symptoms that he mentioned to you in last OV.  Pt didn't ask to be called back so I'm assuming this is an Pharmacist, hospital.

## 2015-08-28 NOTE — Telephone Encounter (Signed)
See below

## 2015-08-29 ENCOUNTER — Ambulatory Visit (INDEPENDENT_AMBULATORY_CARE_PROVIDER_SITE_OTHER): Payer: Medicare Other | Admitting: Family Medicine

## 2015-08-29 ENCOUNTER — Encounter: Payer: Self-pay | Admitting: Family Medicine

## 2015-08-29 DIAGNOSIS — F411 Generalized anxiety disorder: Secondary | ICD-10-CM | POA: Diagnosis not present

## 2015-08-29 MED ORDER — FLUOXETINE HCL 10 MG PO CAPS: ORAL_CAPSULE | ORAL | Status: DC

## 2015-08-29 NOTE — Telephone Encounter (Signed)
Pt LMOM on 08/29/15 at 11:51am requesting a call back in regards to his medication. He stated that he stopped the Paxil but now his Anxiety has been really bad. I advised pt that Dr. Anitra Lauth is out of the office this week and will be back next week on the 25th. Pt stated that he is leaving to go out of the country on the 27th and would like to have something for his Anxiety. I advised him that I can send a message to Dr. Raoul Pitch who is our other provider and see what she recommends. Please advise. Thanks. Per Dr. Raoul Pitch pt will need ov. Pt advised and voiced understanding.  Apt made for 08/29/15 at 3:00pm.

## 2015-08-29 NOTE — Telephone Encounter (Signed)
Yes but will have to wait until next available which I think is late in 2017

## 2015-08-29 NOTE — Progress Notes (Signed)
Subjective:    Patient ID: [name], male    DOB: 12/13/1945, 69 y.o.   MRN: [id number]  HPI  Anxiety: Patient states that he discontinued his Paxil last week. He reports he is having multiple negative side effects that resembled TIA like symptoms. He had discussed this with his PCP in a few weeks prior, and at that time they lowered his dose of Paxil and had ordered a carotid Dopplers and workup for possible TIA/stroke like symptoms. Patient did not have any neurological deficits at that time. Patient states since he decided to go ahead and discontinue the Paxil on his own. Since discontinuing he has noticed increased anxiety and doesn't think he can stand it any longer. Patient has been on multiple different types of medications for his anxiety in the past, including Celexa, trazodone, Effexor and Paxil. Patient states he did get relief from his anxiety from these medications, but the side effects cause problems and he eventually had to change medications. Patient is on Ativan low dose that he uses as needed, he states he's use it more frequently since his with his Paxil. However usually he uses approximately 20 pills a month.  Patient states that he was diagnosed with anxiety/panic attacks after moving to [place] in 1998 from the work. He stated that time he felt that his anxiety was surrounding work related issues. He reports now that he is retired he doesn't feel as anxious as he did prior. He does admit to still needing something for his anxiety. Patient does exercise daily, and he feels this is helpful with management of his anxiety. He also sees a therapist every 2 weeks ([facility]), which he feels helpful. Patient is going out of the country, to the [place], until mid-November. Patient also has concerns about seeing his prior PCP again, he reports it's nothing personal he likes his primary care physician, but it reminds him of her friend he had passed away at the [employer].  He becomes tearful when discussing this.  Past Medical History  Diagnosis Date  . Hypertension   . Hyperlipidemia   . PAF (paroxysmal atrial fibrillation) ([place])   . Mitral regurgitation   . History of pulmonary embolism 2011; 10/2014    Recurrence off of anticoagulation 10/2014: per hem/onc ([name]) pt needs lifelong anticoagulation (hypercoag w/u neg).  . Aortic regurgitation 04/2007 surgery    Resolved with tissue AVR at [facility]  . History of prostate cancer 2003    Rad prost  . Anticoagulants causing adverse effect in therapeutic use   . Migraine syndrome   . Cervical spondylosis     ESI by [name] in Ortho  . Chronic lymphocytic leukemia (CLL), B-cell ([place]) approx 2012  . Pulmonary nodule, right 2015    RLL: resected at [facility] --VATS; Benign RLL nodule (fungal and AFB stains neg.  Plan is for [facility] thoracic surg to do f/u o/v & CT chest 1 yr   . Anxiety   . Panic attacks    Allergies  Allergen Reactions  . Hydrocodone-Acetaminophen Shortness Of Breath  . Oxycodone Hcl Shortness Of Breath  . Telmisartan-Hctz Shortness Of Breath and Palpitations    Dizziness, too  . Ramipril   . Aspirin Other (See Comments) and Rash    Other Reaction: confusion Can't take high dose  . Atorvastatin Other (See Comments)    myalgia  . Buspirone Hcl Other (See Comments)    headache  . Codeine Nausea Only and Other (See Comments)  Other Reaction: confusion  . Codeine Phosphate Itching and Nausea Only  . Hydrocodone-Acetaminophen Nausea Only and Other (See Comments)    Other Reaction: confusion  . Morphine Sulfate Itching  . Rabeprazole Other (See Comments)    headache   Review of Systems Negative, with the exception of above mentioned in HPI    Objective:   Physical Exam BP 140/89 mmHg  Pulse 82  Temp(Src) 98.2 F (36.8 C) (Oral)  Wt 199 lb 6.4 oz (90.447 kg)  SpO2 94% Gen: Afebrile. No acute distress. Nontoxic in appearance, well-developed, well-nourished male. HENT: AT. [place].   MMM. Eyes:Pupils Equal Round Reactive to light, Extraocular movements intact,  Conjunctiva without redness, discharge or icterus. Psych: Tearful at times, moderately anxious. Mood mildly labile. Normal dress.  Normal speech. Normal thought content and judgment. No Si/HI.    Assessment & Plan:  1. Anxiety state - Patient discontinued Paxil on his own. Discussed Zoloft versus Prozac with patient today, with his symptoms and mood today I recommended low-dose Prozac (age also). Patient feels that he is sensitive to medication so we started 10 mg. Patient was given instruction that if he does not experience any negative side effects, 10 mg dose, and 2 weeks he can either leave the dose at 10 mg, or increase to 20 mg if he feels he needs more anxiety coverage. He is to follow-up in 4 weeks. - FLUoxetine (PROZAC) 10 MG capsule; 10 mg QD for 2 weeks, then 20 mg QD.  Dispense: 60 capsule; Refill: 0  > 25 minutes spent with patient, >50% of time spent face to face counseling patient and coordinating care.

## 2015-08-29 NOTE — Patient Instructions (Signed)
Prozac 10 mg daily for 2 weeks, if no negative side effects and you feel like you need more coverage you can increase to 20 mg at that time. If you are fine on the 10 mg dose you may remain that dose.  F/U 4 weeks.

## 2015-08-29 NOTE — Telephone Encounter (Signed)
Pt is aware dr hunter message

## 2015-08-30 NOTE — Telephone Encounter (Signed)
Noted  

## 2015-08-31 ENCOUNTER — Ambulatory Visit (HOSPITAL_COMMUNITY)
Admission: RE | Admit: 2015-08-31 | Discharge: 2015-08-31 | Disposition: A | Discharge status: Home / Self Care | Payer: Medicare Other | Source: Ambulatory Visit | Attending: Cardiology | Admitting: Cardiology

## 2015-08-31 ENCOUNTER — Ambulatory Visit: Payer: 59 | Admitting: Psychology

## 2015-08-31 DIAGNOSIS — I10 Essential (primary) hypertension: Secondary | ICD-10-CM | POA: Diagnosis not present

## 2015-08-31 DIAGNOSIS — G459 Transient cerebral ischemic attack, unspecified: Secondary | ICD-10-CM | POA: Diagnosis not present

## 2015-08-31 DIAGNOSIS — I6523 Occlusion and stenosis of bilateral carotid arteries: Secondary | ICD-10-CM | POA: Diagnosis not present

## 2015-08-31 DIAGNOSIS — E785 Hyperlipidemia, unspecified: Secondary | ICD-10-CM | POA: Insufficient documentation

## 2015-09-01 ENCOUNTER — Encounter (HOSPITAL_COMMUNITY): Payer: Self-pay

## 2015-09-06 ENCOUNTER — Ambulatory Visit (INDEPENDENT_AMBULATORY_CARE_PROVIDER_SITE_OTHER): Payer: 59 | Admitting: Psychology

## 2015-09-06 DIAGNOSIS — F4321 Adjustment disorder with depressed mood: Secondary | ICD-10-CM | POA: Diagnosis not present

## 2015-09-14 ENCOUNTER — Ambulatory Visit: Payer: 59 | Admitting: Psychology

## 2015-09-19 ENCOUNTER — Other Ambulatory Visit (HOSPITAL_COMMUNITY): Payer: Self-pay

## 2015-09-21 ENCOUNTER — Encounter: Payer: Self-pay | Admitting: Family Medicine

## 2015-09-21 ENCOUNTER — Telehealth: Payer: Self-pay | Admitting: Family Medicine

## 2015-09-21 NOTE — Telephone Encounter (Signed)
Noted  

## 2015-09-21 NOTE — Telephone Encounter (Signed)
Pt called to let Dr. Anitra Lauth know that his dizziness issue has been resolved with a change in his medication. He feels better and will cancel his [facility] appt. Pt explained that he didd not want it to appear as though he just did not keep his Neuro appt.   No f/u to this message is needed.

## 2015-09-21 NOTE — Telephone Encounter (Signed)
FYI

## 2015-09-22 ENCOUNTER — Other Ambulatory Visit (HOSPITAL_COMMUNITY): Payer: Self-pay

## 2015-09-28 ENCOUNTER — Ambulatory Visit: Payer: Self-pay | Admitting: Neurology

## 2015-09-28 ENCOUNTER — Ambulatory Visit (INDEPENDENT_AMBULATORY_CARE_PROVIDER_SITE_OTHER): Payer: 59 | Admitting: Psychology

## 2015-09-28 DIAGNOSIS — F4321 Adjustment disorder with depressed mood: Secondary | ICD-10-CM

## 2015-09-29 ENCOUNTER — Encounter: Payer: Self-pay | Admitting: Family Medicine

## 2015-09-29 ENCOUNTER — Ambulatory Visit (INDEPENDENT_AMBULATORY_CARE_PROVIDER_SITE_OTHER): Payer: Medicare Other | Admitting: Family Medicine

## 2015-09-29 VITALS — BP 146/89 | HR 64 | Temp 98.0°F | Resp 20 | Wt 202.2 lb

## 2015-09-29 DIAGNOSIS — F411 Generalized anxiety disorder: Secondary | ICD-10-CM | POA: Diagnosis not present

## 2015-09-29 NOTE — Progress Notes (Signed)
Subjective:    Patient ID: [name], male    DOB: Jul 28, 1946, 69 y.o.   MRN: [id number]  HPI   Anxiety: Pt presents for anxiety follow up after starting Prozac 10 mg, 6 weeks ago. Patient has been out of the country in the [place] since starting medication. He was encouraged to take 10 mg daily, and after 2 weeks if felt needed he could go up to 20 mg daily. This was suggested secondary to him feeling he is very sensitive to medications, and has had many side effects to prior attempts on anti-anxiety medicines. Patient states he is steadily feeling better. He is taking 10 mg of Prozac daily. He is not certain if he feels like he needs to 20 mg dose of Prozac, considering he has just been home approximately 1 week, not in his normal routine, and currently has a mild cold. Patient has continued to see his therapist, and had an appointment with him within the last few days. Patient denies any negative side effects on the 10 mg of Prozac.   Prior note 08/29/2015: Anxiety: Patient states that he discontinued his Paxil last week. He reports he is having multiple negative side effects that resembled TIA like symptoms. He had discussed this with his PCP in a few weeks prior, and at that time they lowered his dose of Paxil and had ordered a carotid Dopplers and workup for possible TIA/stroke like symptoms. Patient did not have any neurological deficits at that time. Patient states since he decided to go ahead and discontinue the Paxil on his own. Since discontinuing he has noticed increased anxiety and doesn't think he can stand it any longer. Patient has been on multiple different types of medications for his anxiety in the past, including Celexa, trazodone, Effexor and Paxil. Patient states he did get relief from his anxiety from these medications, but the side effects cause problems and he eventually had to change medications. Patient is on Ativan low dose that he uses as needed, he states he's use it  more frequently since his with his Paxil. However usually he uses approximately 20 pills a month. Patient states that he was diagnosed with anxiety/panic attacks after moving to [place] in 1998 from the work. He stated that time he felt that his anxiety was surrounding work related issues. He reports now that he is retired he doesn't feel as anxious as he did prior. He does admit to still needing something for his anxiety. Patient does exercise daily, and he feels this is helpful with management of his anxiety. He also sees a therapist every 2 weeks ([facility]), which he feels helpful. Patient is going out of the country, to the [place], until mid-November. Patient also has concerns about seeing his prior PCP again, he reports it's nothing personal he likes his primary care physician, but it reminds him of her friend he had passed away at the [employer]. He becomes tearful when discussing this. Review of Systems Negative, with the exception of above mentioned in HPI     Objective:   Physical Exam BP 146/89 mmHg  Pulse 64  Temp(Src) 98 F (36.7 C) (Oral)  Resp 20  Wt 202 lb 4 oz (91.74 kg)  SpO2 97% Gen: Afebrile. No acute distress. Nontoxic in appearance, well-developed, well-nourished male. Psych: Normal affect, dress and demeanor. Normal speech. Normal thought content and judgment. Appears more relaxed, and mood is more controlled today.     Assessment & Plan:  1. Anxiety  state - Improved. - Continue Prozac 10 mg daily, increase to 20 mg daily if desired. Once patient can adjust back to regular routine after returning from vacation he will call the office, prior to needing immediate refill, and inform us of what dose he has decided on.  - Refills will then be given, with 3 month follow-up. - Patient is to continue seeing his therapist  Follow-up 3-4 months

## 2015-09-29 NOTE — Patient Instructions (Signed)
Call me when you are about 5 pills from needing a refill, to inform me of the dose you decided on. Give yourself a little time to adjust to being back stateside to see if you will need to increase the dose to 20 mg.  I am glad you had a good time on your trip.  I will call in refills at that time for the dose you decided on.

## 2015-10-12 ENCOUNTER — Ambulatory Visit (INDEPENDENT_AMBULATORY_CARE_PROVIDER_SITE_OTHER): Payer: 59 | Admitting: Psychology

## 2015-10-12 DIAGNOSIS — F4321 Adjustment disorder with depressed mood: Secondary | ICD-10-CM | POA: Diagnosis not present

## 2015-10-15 ENCOUNTER — Other Ambulatory Visit: Payer: Self-pay | Admitting: Cardiovascular Disease

## 2015-10-17 ENCOUNTER — Telehealth: Payer: Self-pay | Admitting: *Deleted

## 2015-10-17 DIAGNOSIS — F411 Generalized anxiety disorder: Secondary | ICD-10-CM

## 2015-10-17 MED ORDER — FLUOXETINE HCL 10 MG PO CAPS: 10.0000 mg | ORAL_CAPSULE | Freq: Every day | ORAL | Status: DC

## 2015-10-17 NOTE — Telephone Encounter (Signed)
Yes we refill at the 10 mg  Prozac dose for 6 months.

## 2015-10-17 NOTE — Telephone Encounter (Signed)
Patient states he was supposed to let you know his dose of Prozac so it can be refilled.. He states he is on the 10mg  daily. Which is listed on his medication list as being 10mg  then increased to 20 mg. Patient states the 10 mg is working fine he hasnt increased dosage. Do you want to refill this at 10mg  daily?

## 2015-10-17 NOTE — Telephone Encounter (Signed)
prozac 10 mg refilled

## 2015-10-18 ENCOUNTER — Telehealth: Payer: Self-pay | Admitting: Cardiovascular Disease

## 2015-10-18 NOTE — Telephone Encounter (Signed)
Pt had a test done recently and was told by Dr. Anitra Lauth that he needs a heart monitor, has called them but no call back yet, he wants to speak to nurse here to ask more questions pls call

## 2015-10-18 NOTE — Telephone Encounter (Signed)
Spoke with pt and he states that he received a call to schedule having a holter monitor placed and that Dr. Anitra Lauth had ordered it. Pt states that he was unaware that this had been ordered and was surprised when he was called to schedule it. Advised pt that per Dr. Idelle Leech note from 10/12 "Will check carotid dopplers and will get 48h Holter monitoring to see if PAF may be occuring." Pt states that he did have the carotid done and it was normal so he didn't understand why he needed the monitor. Advised pt that it was to see if PAF was possibly occuring. Pt still not sure if he really needs monitor and states that if he does he would prefer that it be ordered and followed by Dr. [name]. Advised pt to call Dr. Idelle Leech office to see if he felt that the monitor was still warranted since he was unsure. Pt verbalized understanding and was in agreement with this plan.

## 2015-10-26 ENCOUNTER — Ambulatory Visit (INDEPENDENT_AMBULATORY_CARE_PROVIDER_SITE_OTHER): Payer: 59 | Admitting: Psychology

## 2015-10-26 DIAGNOSIS — F4321 Adjustment disorder with depressed mood: Secondary | ICD-10-CM

## 2015-11-08 ENCOUNTER — Ambulatory Visit (HOSPITAL_BASED_OUTPATIENT_CLINIC_OR_DEPARTMENT_OTHER): Payer: 59 | Admitting: Hematology & Oncology

## 2015-11-08 ENCOUNTER — Other Ambulatory Visit (HOSPITAL_BASED_OUTPATIENT_CLINIC_OR_DEPARTMENT_OTHER): Payer: Medicare Other

## 2015-11-08 VITALS — BP 145/83 | HR 81 | Temp 97.7°F | Resp 20 | Wt 205.0 lb

## 2015-11-08 DIAGNOSIS — C9110 Chronic lymphocytic leukemia of B-cell type not having achieved remission: Secondary | ICD-10-CM

## 2015-11-08 LAB — COMPREHENSIVE METABOLIC PANEL
ALT: 31 U/L (ref 0–55)
AST: 20 U/L (ref 5–34)
Albumin: 3.9 g/dL (ref 3.5–5.0)
Alkaline Phosphatase: 64 U/L (ref 40–150)
Anion Gap: 10 mEq/L (ref 3–11)
BUN: 21.1 mg/dL (ref 7.0–26.0)
CO2: 27 mEq/L (ref 22–29)
Calcium: 9.6 mg/dL (ref 8.4–10.4)
Chloride: 106 mEq/L (ref 98–109)
Creatinine: 1.4 mg/dL — ABNORMAL HIGH (ref 0.7–1.3)
EGFR: 61 mL/min/{1.73_m2} — ABNORMAL LOW (ref >90)
Glucose: 97 mg/dl (ref 70–140)
Potassium: 5.4 mEq/L — ABNORMAL HIGH (ref 3.5–5.1)
Sodium: 143 mEq/L (ref 136–145)
Total Bilirubin: 0.35 mg/dL (ref 0.20–1.20)
Total Protein: 7 g/dL (ref 6.4–8.3)

## 2015-11-08 LAB — CBC WITH DIFFERENTIAL (CANCER CENTER ONLY)
BASO#: 0 10*3/uL (ref 0.0–0.2)
BASO%: 0.2 % (ref 0.0–2.0)
EOS%: 0.9 % (ref 0.0–7.0)
Eosinophils Absolute: 0.1 10*3/uL (ref 0.0–0.5)
HCT: 44.2 % (ref 38.7–49.9)
HGB: 14.5 g/dL (ref 13.0–17.1)
LYMPH#: 10.2 10*3/uL — ABNORMAL HIGH (ref 0.9–3.3)
LYMPH%: 78.2 % — ABNORMAL HIGH (ref 14.0–48.0)
MCH: 31.4 pg (ref 28.0–33.4)
MCHC: 32.8 g/dL (ref 32.0–35.9)
MCV: 96 fL (ref 82–98)
MONO#: 1 10*3/uL — ABNORMAL HIGH (ref 0.1–0.9)
MONO%: 8 % (ref 0.0–13.0)
NEUT#: 1.7 10*3/uL (ref 1.5–6.5)
NEUT%: 12.7 % — ABNORMAL LOW (ref 40.0–80.0)
Platelets: 148 10*3/uL (ref 145–400)
RBC: 4.62 10*6/uL (ref 4.20–5.70)
RDW: 13 % (ref 11.1–15.7)
WBC: 13 10*3/uL — ABNORMAL HIGH (ref 4.0–10.0)

## 2015-11-08 LAB — TECHNOLOGIST REVIEW CHCC SATELLITE

## 2015-11-08 LAB — CHCC SATELLITE - SMEAR

## 2015-11-08 NOTE — Progress Notes (Signed)
Hematology and Oncology Follow Up Visit  [name] [id number] June 14, 1946 69 y.o. 11/08/2015   Principle Diagnosis:   Right lower lobe pulmonary nodule- non-malignant  Recurrent pulmonary embolism  CLL-stable  Current Therapy:    Xarelto 20 mg by mouth daily     Interim History:  [name] is back for follow-up. He is doing okay. Last saw him 6 months ago. He's had no problems since we saw him. He is staying active.  He is on Xarelto and doing well with the Xarelto. He's had no bleeding.  His appetite is doing okay. There is no nausea or vomiting.  He's done some traveling. He'll go to [place] for a little bit.  He's had no fever, sweats or chills. He's not noted any palpable lymph glands.  He's had no rashes. There's been no leg swelling.  Overall, his performance status is ECOG 0. Medications:  Current outpatient prescriptions:  .  amoxicillin (AMOXIL) 500 MG capsule, Takes 2000mg  for dental procedures., Disp: , Rfl:  .  diclofenac (VOLTAREN) 75 MG EC tablet, 25 mg 2 (two) times daily., Disp: , Rfl:  .  FLUoxetine (PROZAC) 10 MG capsule, Take 1 capsule (10 mg total) by mouth daily., Disp: 30 capsule, Rfl: 5 .  imipramine (TOFRANIL) 10 MG tablet, 1 tab po qhs for insomnia, Disp: 30 tablet, Rfl: 11 .  LORazepam (ATIVAN) 0.5 MG tablet, Take 1 tablet (0.5 mg total) by mouth daily as needed for anxiety., Disp: 30 tablet, Rfl: 5 .  metoprolol succinate (TOPROL-XL) 25 MG 24 hr tablet, TAKE 1 TABLET BY MOUTH EVERY DAY, Disp: 30 tablet, Rfl: 0 .  rivaroxaban (XARELTO) 20 MG TABS tablet, Take 1 tablet (20 mg total) by mouth daily with supper., Disp: 30 tablet, Rfl: 6  Allergies:  Allergies  Allergen Reactions  . Hydrocodone-Acetaminophen Shortness Of Breath  . Oxycodone Hcl Shortness Of Breath  . Telmisartan-Hctz Shortness Of Breath and Palpitations    Dizziness, too  . Ramipril   . Aspirin Other (See Comments) and Rash    Other Reaction: confusion Can't take high  dose  . Atorvastatin Other (See Comments)    myalgia  . Buspirone Hcl Other (See Comments)    headache  . Codeine Nausea Only and Other (See Comments)    Other Reaction: confusion  . Codeine Phosphate Itching and Nausea Only  . Hydrocodone-Acetaminophen Nausea Only and Other (See Comments)    Other Reaction: confusion  . Morphine Sulfate Itching  . Paroxetine Other (See Comments)    Paresthesias: R arm, R leg, r side of face  . Rabeprazole Other (See Comments)    headache    Past Medical History, Surgical history, Social history, and Family History were reviewed and updated.  Review of Systems: As above  Physical Exam:  weight is 205 lb (92.987 kg). His oral temperature is 97.7 F (36.5 C). His blood pressure is 145/83 and his pulse is 81. His respiration is 20.   Well-developed and well-nourished African-American gentleman. There are no ocular or oral lesions. He has no palpable cervical or supraclavicular lymph nodes. Lungs are clear. Has a rales, wheezes or rhonchi. Cardiac exam regular rate and rhythm with no murmurs, rubs or bruits. Abdomen is soft. Has good bowel sounds. There is no fluid wave. There is no palpable liver or spleen tip. Back exam shows a well-healed thoracoscopy scar in the right lateral chest wall. Where he had the chest tube in place just inferior to the thoracoscopy scar is  also healing nicely. There is no erythema or warmth over the thoracoscopy site. no tenderness over the spine, ribs or hips. Extremities shows no clubbing, cyanosis or edema. No venous cord is palpable is legs. He has a negative Homans sign. Skin exam shows no rashes, ecchymoses or petechia. Neurological exam is nonfocal.  Lab Results  Component Value Date   WBC 13.0* 11/08/2015   HGB 14.5 11/08/2015   HCT 44.2 11/08/2015   MCV 96 11/08/2015   PLT 148 11/08/2015     Chemistry      Component Value Date/Time   NA 140 05/09/2015 0943   NA 141 02/06/2015 1421   K 4.2 05/09/2015 0943   K  4.2 02/06/2015 1421   CL 104 05/09/2015 0943   CL 107 02/06/2015 1421   CO2 27 05/09/2015 0943   CO2 28 02/06/2015 1421   BUN 21 05/09/2015 0943   BUN 16 02/06/2015 1421   CREATININE 1.24 05/09/2015 0943   CREATININE 1.5* 02/06/2015 1421      Component Value Date/Time   CALCIUM 9.1 05/09/2015 0943   CALCIUM 9.1 02/06/2015 1421   ALKPHOS 56 05/09/2015 0943   ALKPHOS 71 02/06/2015 1421   AST 17 05/09/2015 0943   AST 28 02/06/2015 1421   ALT 19 05/09/2015 0943   ALT 28 02/06/2015 1421   BILITOT 0.5 05/09/2015 0943   BILITOT 0.50 02/06/2015 1421         Impression and Plan: [name] is 69 year old African-American gentleman. He has recurrent pulmonary embolism. I think he needs lifelong anticoagulation.  Of note, he does have a tissue prosthetic aortic valve. This was placed about 4 years ago at [facility]. He's had no problems with this.  A again, there was no problems with malignancy with this lung nodule. I'm just thankful that he had the surgery and he feels much better having the surgery and finding out that there is no cancer.  I think for now, we can get him back in one year. Everything is holding very stable for him. If there a problems then we can certainly get him back sooner.   Volanda Napoleon, MD 12/28/201610:52 AM

## 2015-11-09 ENCOUNTER — Ambulatory Visit (INDEPENDENT_AMBULATORY_CARE_PROVIDER_SITE_OTHER): Payer: 59 | Admitting: Psychology

## 2015-11-09 DIAGNOSIS — F4321 Adjustment disorder with depressed mood: Secondary | ICD-10-CM | POA: Diagnosis not present

## 2015-11-13 ENCOUNTER — Other Ambulatory Visit: Payer: Self-pay | Admitting: Cardiovascular Disease

## 2015-11-15 ENCOUNTER — Encounter: Payer: Self-pay | Admitting: Family Medicine

## 2015-11-15 ENCOUNTER — Ambulatory Visit (INDEPENDENT_AMBULATORY_CARE_PROVIDER_SITE_OTHER): Payer: Medicare Other | Admitting: Family Medicine

## 2015-11-15 VITALS — BP 140/94 | HR 65 | Temp 97.9°F | Resp 16 | Wt 206.0 lb

## 2015-11-15 DIAGNOSIS — I10 Essential (primary) hypertension: Secondary | ICD-10-CM | POA: Diagnosis not present

## 2015-11-15 DIAGNOSIS — E785 Hyperlipidemia, unspecified: Secondary | ICD-10-CM

## 2015-11-15 DIAGNOSIS — F411 Generalized anxiety disorder: Secondary | ICD-10-CM | POA: Diagnosis not present

## 2015-11-15 NOTE — Progress Notes (Signed)
OFFICE VISIT  11/15/2015   CC:  Chief Complaint  Patient presents with  . Follow-up    Pt is not fasting.     HPI:    Patient is a 70 y.o. African-American male who presents for f/u anxiety d/o, HTN. Saw ortho for L knee pain that he was having after lots of athletic training recently, is on diclofenac bid now. Says, basically things are going "pretty good".  Feels like the prozac at current dose is "just right".  Doesn't feel overmedicated by the med and he continues to see his psychologist.   He reiterates that I look like a former close friend of his that died in the [employer] tragedy in [place].  He admits this makes him emotional around me but he thinks it is going to be ok to continue to see me.  Not checking bp at home b/c he is the kind to get too nervous about the readings.   Past Medical History  Diagnosis Date  . Hypertension   . Hyperlipidemia   . PAF (paroxysmal atrial fibrillation) ([place])   . Mitral regurgitation   . History of pulmonary embolism 2011; 10/2014    Recurrence off of anticoagulation 10/2014: per hem/onc ([name]) pt needs lifelong anticoagulation (hypercoag w/u neg).  . Aortic regurgitation 04/2007 surgery    Resolved with [facility]R at [facility]  . History of prostate cancer 2003    Rad prost  . Anticoagulants causing adverse effect in therapeutic use   . Migraine syndrome   . Cervical spondylosis     ESI by [name] in Ortho  . Chronic lymphocytic leukemia (CLL), B-cell ([place]) approx 2012    stable on f/u's with [name] (most recent 10/2015)  . Pulmonary nodule, right 2015    RLL: resected at [facility] --VATS; Benign RLL nodule (fungal and AFB stains neg.  Plan is for [facility] thoracic surg to do f/u o/v & CT chest 1 yr   . Anxiety     Paxil seemed to cause odd neuro side effects; better on prozac as of 09/2015  . Panic attacks     "           "             "                  "                  "                       "                              "                           Past Surgical History  Procedure Laterality Date  . Cardiac catheterization  04/2010    Normal coronaries.  . Penile prosthesis implant    . Prostatectomy  04/2002    for prostate cancer  . Cataract extraction      L 12/06/09   R 09/14/09  . Aortic valve replacement  2011    [place]   . Colonoscopy  01/26/13    tic's, o/w normal.  ([facility])--recall 10 yrs.  . Transthoracic echocardiogram  09/20/2014    mild LVH, EF 50-55%, wall motion nl,  grade I diast dysfxn, prosth aort valve good,transaortic gradients decreased compared to echo 11/2013.   . Carotid dopplers  08/2015    NORMAL    Outpatient Prescriptions Prior to Visit  Medication Sig Dispense Refill  . diclofenac (VOLTAREN) 75 MG EC tablet 25 mg 2 (two) times daily.    Marland Kitchen FLUoxetine (PROZAC) 10 MG capsule Take 1 capsule (10 mg total) by mouth daily. 30 capsule 5  . imipramine (TOFRANIL) 10 MG tablet 1 tab po qhs for insomnia 30 tablet 11  . LORazepam (ATIVAN) 0.5 MG tablet Take 1 tablet (0.5 mg total) by mouth daily as needed for anxiety. 30 tablet 5  . metoprolol succinate (TOPROL-XL) 25 MG 24 hr tablet TAKE 1 TABLET BY MOUTH EVERY DAY 30 tablet 0  . rivaroxaban (XARELTO) 20 MG TABS tablet Take 1 tablet (20 mg total) by mouth daily with supper. 30 tablet 6  . amoxicillin (AMOXIL) 500 MG capsule Reported on 11/15/2015     No facility-administered medications prior to visit.    Allergies  Allergen Reactions  . Hydrocodone-Acetaminophen Shortness Of Breath  . Oxycodone Hcl Shortness Of Breath  . Telmisartan-Hctz Shortness Of Breath and Palpitations    Dizziness, too  . Ramipril   . Aspirin Other (See Comments) and Rash    Other Reaction: confusion Can't take high dose  . Atorvastatin Other (See Comments)    myalgia  . Buspirone Hcl Other (See Comments)    headache  . Codeine Nausea Only and Other (See Comments)    Other Reaction: confusion  . Codeine Phosphate Itching and Nausea Only  .  Hydrocodone-Acetaminophen Nausea Only and Other (See Comments)    Other Reaction: confusion  . Morphine Sulfate Itching  . Paroxetine Other (See Comments)    Paresthesias: R arm, R leg, r side of face  . Rabeprazole Other (See Comments)    headache    ROS As per HPI  PE: Blood pressure 140/94, pulse 65, temperature 97.9 F (36.6 C), temperature source Oral, resp. rate 16, weight 206 lb (93.441 kg), SpO2 100 %.Repeat bp 144/94 Gen: Alert, well appearing.  Patient is oriented to person, place, time, and situation. AFFECT: pleasant, lucid thought and speech. CV: RRR, no m/r/g.   LUNGS: CTA bilat, nonlabored resps, good aeration in all lung fields.   LABS:  Lab Results  Component Value Date   TSH 1.93 04/11/2014   Lab Results  Component Value Date   WBC 13.0* 11/08/2015   HGB 14.5 11/08/2015   HCT 44.2 11/08/2015   MCV 96 11/08/2015   PLT 148 11/08/2015   Lab Results  Component Value Date   CREATININE 1.4* 11/08/2015   BUN 21.1 11/08/2015   NA 143 11/08/2015   K 5.4* 11/08/2015   CL 104 05/09/2015   CO2 27 11/08/2015   Lab Results  Component Value Date   ALT 31 11/08/2015   AST 20 11/08/2015   ALKPHOS 64 11/08/2015   BILITOT 0.35 11/08/2015   Lab Results  Component Value Date   CHOL 277* 11/09/2014   Lab Results  Component Value Date   HDL 39.20 11/09/2014   Lab Results  Component Value Date   LDLCALC 201* 11/09/2014   Lab Results  Component Value Date   TRIG 186.0* 11/09/2014   Lab Results  Component Value Date   CHOLHDL 7 11/09/2014   Lab Results  Component Value Date   PSA 0.01* 11/16/2014   PSA 0.00* 03/30/2013   PSA 0.00 Repeated and verified X2.* 03/22/2011  IMPRESSION AND PLAN:  1) GAD with hx of panic: seems to be stable and tolerating prozac 10mg  qd, seeing psychologist regularly. I told him that if seeing me gets to the point of upsetting him too much (b/c I remind him of a friend he lost in [facility] tragedy) he can switch to seeing  Dr. Raoul Pitch at any time or establish with a provider at another location--I said I would not take it personally.  For now, though, it looks like he is going to stick with me as his PCP.  2) Hx of hyperlipidemia: due for FLP. Not fasting today: he'll return for lab visit when fasting to get FLP.  3) Hx of prostate cancer: prostatectomy 2003, PSA surveillance since that time have been undetectable. He no longer follows up with a urologist.  We decided no further PSA testing will be done unless he gets nervous and changes his mind OR if there are any "red flag" symptoms that would prompt testing. He understands and agrees with this plan today.  An After Visit Summary was printed and given to the patient.  FOLLOW UP: Return in about 6 months (around 05/14/2016) for routine chronic illness f/u (30 min).

## 2015-11-15 NOTE — Progress Notes (Signed)
Pre visit review using our clinic review tool, if applicable. No additional management support is needed unless otherwise documented below in the visit note. 

## 2015-11-22 ENCOUNTER — Encounter: Payer: Self-pay | Admitting: Family Medicine

## 2015-11-22 ENCOUNTER — Other Ambulatory Visit (INDEPENDENT_AMBULATORY_CARE_PROVIDER_SITE_OTHER): Payer: Medicare Other

## 2015-11-22 DIAGNOSIS — I10 Essential (primary) hypertension: Secondary | ICD-10-CM | POA: Diagnosis not present

## 2015-11-22 DIAGNOSIS — E785 Hyperlipidemia, unspecified: Secondary | ICD-10-CM

## 2015-11-22 LAB — LIPID PANEL
Cholesterol: 320 mg/dL — ABNORMAL HIGH (ref 0–200)
HDL: 46.5 mg/dL (ref >39.00)
LDL Cholesterol: 238 mg/dL — ABNORMAL HIGH (ref 0–99)
NonHDL: 273.39
Total CHOL/HDL Ratio: 7
Triglycerides: 178 mg/dL — ABNORMAL HIGH (ref 0.0–149.0)
VLDL: 35.6 mg/dL (ref 0.0–40.0)

## 2015-11-22 LAB — BASIC METABOLIC PANEL
BUN: 22 mg/dL (ref 6–23)
CO2: 32 mEq/L (ref 19–32)
Calcium: 9.3 mg/dL (ref 8.4–10.5)
Chloride: 105 mEq/L (ref 96–112)
Creatinine, Ser: 1.19 mg/dL (ref 0.40–1.50)
GFR: 77.78 mL/min (ref >60.00)
Glucose, Bld: 100 mg/dL — ABNORMAL HIGH (ref 70–99)
Potassium: 5.2 mEq/L — ABNORMAL HIGH (ref 3.5–5.1)
Sodium: 142 mEq/L (ref 135–145)

## 2015-11-23 ENCOUNTER — Ambulatory Visit (INDEPENDENT_AMBULATORY_CARE_PROVIDER_SITE_OTHER): Payer: 59 | Admitting: Psychology

## 2015-11-23 DIAGNOSIS — F4321 Adjustment disorder with depressed mood: Secondary | ICD-10-CM

## 2015-11-27 ENCOUNTER — Other Ambulatory Visit: Payer: Self-pay | Admitting: *Deleted

## 2015-11-27 DIAGNOSIS — F411 Generalized anxiety disorder: Secondary | ICD-10-CM

## 2015-11-27 MED ORDER — FLUOXETINE HCL 10 MG PO CAPS: 10.0000 mg | ORAL_CAPSULE | Freq: Every day | ORAL | Status: DC

## 2015-11-27 NOTE — Telephone Encounter (Signed)
RF request for fluoxetine - 90 day supply LOV: 11/15/15 Next ov: 05/27/16 Last written: 10/17/15 #30 w/ 5RF

## 2015-11-29 ENCOUNTER — Encounter: Payer: Self-pay | Admitting: Family Medicine

## 2015-11-29 ENCOUNTER — Other Ambulatory Visit (INDEPENDENT_AMBULATORY_CARE_PROVIDER_SITE_OTHER): Payer: 59

## 2015-11-29 DIAGNOSIS — R7309 Other abnormal glucose: Secondary | ICD-10-CM

## 2015-11-29 LAB — HEMOGLOBIN A1C: Hgb A1c MFr Bld: 5.7 % (ref 4.6–6.5)

## 2015-12-01 ENCOUNTER — Encounter: Payer: Self-pay | Admitting: *Deleted

## 2015-12-02 NOTE — Progress Notes (Signed)
Patient ID: [name], male   DOB: [birth year], 70 y.o.   MRN: [id number]    Cardiology Office Note   Date:  12/04/2015   ID:  [name], DOB 30-Sep-1946, MRN [id number]  PCP:  [name], MD  Cardiologist:  [name]     Chief Complaint  Patient presents with  . Aortic Insuffiency  . Hypertension     History of Present Illness: [name] is a 70 y.o. male with a hx of severe aortic insufficiency, status post bioprosthetic AVR by [name] at [facility] in 05/2010, normal coronary arteries by cardiac catheterization in 2011, prior DVT, persistent LUE basilic/cephalic thrombus, recurrent pulmonary emboli (most recently 10/2014), PAF, Xarelto anticoagulation, HTN, HL, CLL, prostate CA status post radical prostatectomy, RLL lung nodule. The patient has been followed by [name] with oncology. PET scan has been negative for hypermetabolic activity regarding his lung nodule. However, recent chest CT demonstrated increasing size. Of note, there was resolution of pulmonary emboli. Resection has been recommended. The patient is being referred to a thoracic surgeon at [facility].    04/03/15  Had throascopic RLL wedge resection [place] Turned out to be benign granulomatous nodule And not cancer    Studies/Reports Reviewed Today:  Chest CTA 02/06/15 IMPRESSION: 1. Previously described pulmonary emboli have resolved. There are no new pulmonary emboli. 2. Right lower lobe pulmonary nodule has increased in size. It shows no appreciable hyper metabolism on the recent prior PET-CT. That given the size increase, biopsy should be considered. 3. No acute findings in the lungs. 4. Stable mild cardiomegaly and stable changes from previous cardiac surgery and aortic valve replacement.  Echo 09/20/14 - Mild concentric hypertrophy. EF 50% to 55%. Wallmotion was normal; Grade 1 diastolic dysfunction. - Aortic valve: A bioprosthetic valve sits well in the aorticposition. There is no  central AI or paravalvular leak. Peak andmean gradients are 30 and 16 mmHg, respectively and are normalfor this type of prosthesis. Mean gradient (S): 16 mm Hg. Peakgradient (S): 30 mm Hg. - Aortic root: The aortic root was normal in size. - Mitral valve: Structurally normal valve. There was mildregurgitation. - Left atrium: The atrium was mildly dilated. - Right ventricle: Systolic function was normal. - Right atrium: The atrium was normal in size. - Pulmonic valve: There was mild regurgitation. - Pulmonary arteries: Systolic pressure was within the normalrange. - Inferior vena cava: The vessel was normal in size. - Pericardium, extracardiac: There was no pericardial effusion. Impressions: When compared to the study from 11/26/2013 transaortic gradientsare now decreased.  Upper Ext Venous Duplex 07/2014 Acute superficial thrombus noted in the left mid to distal basilic and mid cephalic vein. All other left upper extremity deep and superficial veins, show no evidence of intraluminal thrombus.  LHC 04/2010 Normal coronary arteries  08/2015  Normal carotid duplex   Past Medical History  Diagnosis Date  . Hypertension   . Hyperlipidemia     statin-intolerant.  Marland Kitchen PAF (paroxysmal atrial fibrillation) ([place])   . Mitral regurgitation   . History of pulmonary embolism 2011; 10/2014    Recurrence off of anticoagulation 10/2014: per hem/onc ([name]) pt needs lifelong anticoagulation (hypercoag w/u neg).  . Aortic regurgitation 04/2007 surgery    Resolved with tissue AVR at [facility]  . History of prostate cancer 2003    Rad prost  . Anticoagulants causing adverse effect in therapeutic use   . Migraine syndrome   . Cervical spondylosis     ESI by Dr.  Wong in Ortho  . Chronic lymphocytic leukemia (CLL), B-cell ([place]) approx 2012    stable on f/u's with [name] (most recent 10/2015)  . Pulmonary nodule, right 2015    RLL: resected at [facility] --VATS; Benign RLL nodule (fungal and AFB  stains neg.  Plan is for [facility] thoracic surg to do f/u o/v & CT chest 1 yr   . Anxiety     Paxil seemed to cause odd neuro side effects; better on prozac as of 09/2015  . Panic attacks     "           "             "                  "                  "                       "                             "                           Past Surgical History  Procedure Laterality Date  . Cardiac catheterization  04/2010    Normal coronaries.  . Penile prosthesis implant    . Prostatectomy  04/2002    for prostate cancer  . Cataract extraction      L 12/06/09   R 09/14/09  . Aortic valve replacement  2011    [place]   . Colonoscopy  01/26/13    tic's, o/w normal.  ([facility])--recall 10 yrs.  . Transthoracic echocardiogram  09/20/2014    mild LVH, EF 50-55%, wall motion nl, grade I diast dysfxn, prosth aort valve good,transaortic gradients decreased compared to echo 11/2013.   . Carotid dopplers  08/2015    NORMAL  . Pulmonary nodule resection  Fall/winter 2016    [facility]     Current Outpatient Prescriptions  Medication Sig Dispense Refill  . diclofenac (VOLTAREN) 75 MG EC tablet Take 25 mg by mouth 2 (two) times daily.     Marland Kitchen FLUoxetine (PROZAC) 10 MG capsule Take 1 capsule (10 mg total) by mouth daily. 90 capsule 3  . imipramine (TOFRANIL) 10 MG tablet Take 10 mg by mouth at bedtime.    Marland Kitchen LORazepam (ATIVAN) 0.5 MG tablet Take 1 tablet (0.5 mg total) by mouth daily as needed for anxiety. 30 tablet 5  . metoprolol succinate (TOPROL-XL) 25 MG 24 hr tablet TAKE 1 TABLET BY MOUTH EVERY DAY 30 tablet 0  . rivaroxaban (XARELTO) 20 MG TABS tablet Take 1 tablet (20 mg total) by mouth daily with supper. 30 tablet 6   No current facility-administered medications for this visit.    Allergies:   Hydrocodone-acetaminophen; Oxycodone hcl; Telmisartan-hctz; Ramipril; Aspirin; Atorvastatin; Buspirone hcl; Codeine; Codeine phosphate; Hydrocodone-acetaminophen; Morphine sulfate; Paroxetine; and Rabeprazole     Social History:  The patient  reports that he quit smoking about 34 years ago. His smoking use included Cigarettes. He started smoking about 47 years ago. He has a 12 pack-year smoking history. He has never used smokeless tobacco. He reports that he drinks alcohol. He reports that he does not use illicit drugs.   Family History:  The patient's family history includes Cancer in  his sister; Colon cancer (age of onset: 66) in his sister; Hyperlipidemia in his other; Hypertension in his other; Stroke in his brother, mother, and sister. There is no history of Heart attack.    ROS:   Please see the history of present illness.   Review of Systems  All other systems reviewed and are negative.     PHYSICAL EXAM: VS:  BP 116/64 mmHg  Pulse 79  Ht 5\' 10"  (1.778 m)  Wt 93.704 kg (206 lb 9.3 oz)  BMI 29.64 kg/m2  SpO2 95%    Wt Readings from Last 3 Encounters:  12/04/15 93.704 kg (206 lb 9.3 oz)  11/15/15 93.441 kg (206 lb)  11/08/15 92.987 kg (205 lb)     GEN: Well nourished, well developed, in no acute distress HEENT: normal Neck: no JVD, left  carotid bruits, no masses Cardiac:  Normal S1/accentuated S2, RRR; no murmur ,  no rubs or gallops, no edema  Respiratory:  clear to auscultation bilaterally, no wheezing, rhonchi or rales. GI: soft, nontender, nondistended, + BS MS: no deformity or atrophy Skin: warm and dry  Neuro:  CNs II-XII intact, Strength and sensation are intact Psych: Normal affect   EKG:   03/22/15  NSR, HR 67, LAFB, RBBB, no change from prior tracing   Recent Labs: 11/08/2015: ALT 31; HGB 14.5; Platelets 148 11/22/2015: BUN 22; Creatinine, Ser 1.19; Potassium 5.2*; Sodium 142    Lipid Panel    Component Value Date/Time   CHOL 320* 11/22/2015 0852   TRIG 178.0* 11/22/2015 0852   HDL 46.50 11/22/2015 0852   CHOLHDL 7 11/22/2015 0852   VLDL 35.6 11/22/2015 0852   LDLCALC 238* 11/22/2015 0852   LDLDIRECT 169.0 03/30/2013 0936      ASSESSMENT AND  PLAN:  Aortic insufficiency S/P Bioprosthetic AVR (aortic valve replacement) in 2011 Stable gradients by most recent echo.  Continue SBE prophylaxis.  F/u echo 03/2016   History of DVT (deep vein thrombosis) and Recurrent pulmonary embolism Continue Xarelto. This is managed by heme/onc.   Paroxysmal atrial fibrillation Maintaining NSR.  Continue Toprol-XL and Xarelto.  Essential hypertension Borderline control.  Continue to monitor.  HLD (hyperlipidemia) Managed by PCP.  Lung nodule  Post VATS at [facility]  Benign nodule healing well   Carotid Bruit:  Left duplex no stenosis f/u 08/2017  Depression:  Better off paroxetine now on low dose prozac with tofranil   Current medicines are reviewed at length with the patient today.  Concerns regarding medicines are as outlined above.  The following changes have been made:    None    Jenkins Rouge

## 2015-12-04 ENCOUNTER — Encounter: Payer: Self-pay | Admitting: Cardiovascular Disease

## 2015-12-04 ENCOUNTER — Ambulatory Visit (INDEPENDENT_AMBULATORY_CARE_PROVIDER_SITE_OTHER): Payer: Medicare Other | Admitting: Cardiovascular Disease

## 2015-12-04 VITALS — BP 116/64 | HR 79 | Ht 70.0 in | Wt 206.6 lb

## 2015-12-04 DIAGNOSIS — I351 Nonrheumatic aortic (valve) insufficiency: Secondary | ICD-10-CM

## 2015-12-04 DIAGNOSIS — I10 Essential (primary) hypertension: Secondary | ICD-10-CM | POA: Diagnosis not present

## 2015-12-04 NOTE — Patient Instructions (Addendum)
Medication Instructions:  Your physician recommends that you continue on your current medications as directed. Please refer to the Current Medication list given to you today.  Labwork: NONE  Testing/Procedures: Your physician has requested that you have an echocardiogram in May, same day as office visit.. Echocardiography is a painless test that uses sound waves to create images of your heart. It provides your doctor with information about the size and shape of your heart and how well your heart's chambers and valves are working. This procedure takes approximately one hour. There are no restrictions for this procedure.    Follow-Up: Your physician wants you to follow-up in: 4 months with Dr. [name]. You will receive a reminder letter in the mail two months in advance. If you don't receive a letter, please call our office to schedule the follow-up appointment.  If you need a refill on your cardiac medications before your next appointment, please call your pharmacy.

## 2015-12-07 ENCOUNTER — Ambulatory Visit (INDEPENDENT_AMBULATORY_CARE_PROVIDER_SITE_OTHER): Payer: 59 | Admitting: Psychology

## 2015-12-07 DIAGNOSIS — F4321 Adjustment disorder with depressed mood: Secondary | ICD-10-CM

## 2015-12-14 ENCOUNTER — Ambulatory Visit (INDEPENDENT_AMBULATORY_CARE_PROVIDER_SITE_OTHER): Payer: Medicare Other | Admitting: Family Medicine

## 2015-12-14 ENCOUNTER — Encounter: Payer: Self-pay | Admitting: Family Medicine

## 2015-12-14 VITALS — BP 128/85 | HR 77 | Temp 97.9°F | Resp 16 | Ht 70.0 in | Wt 204.5 lb

## 2015-12-14 DIAGNOSIS — Z7901 Long term (current) use of anticoagulants: Secondary | ICD-10-CM

## 2015-12-14 DIAGNOSIS — A09 Infectious gastroenteritis and colitis, unspecified: Secondary | ICD-10-CM | POA: Diagnosis not present

## 2015-12-14 DIAGNOSIS — R197 Diarrhea, unspecified: Secondary | ICD-10-CM

## 2015-12-14 NOTE — Progress Notes (Signed)
Pre visit review using our clinic review tool, if applicable. No additional management support is needed unless otherwise documented below in the visit note. 

## 2015-12-14 NOTE — Progress Notes (Signed)
OFFICE VISIT  12/14/2015   CC:  Chief Complaint  Patient presents with  . Abdominal Pain  . Rectal Bleeding   HPI:    Patient is a 70 y.o. African-American male who presents for recent diarrheal illness. Four days ago he had a day full of having repetitive diarrhea BMs--5-10 in that day--+ blood mixed in the stool.   No fevers. He continued to have multiple loose BMs over the next few days--nonbloody.  Stomach feels very gassy, then he has a BM, then continues to have lower abd fullness after every BM.  No nausea or loss of appetite. He has done some dietary adjustments that have helped a bit the last couple days, and he feels a lot better today but is not well completely. Most recent loose BM was 2:30 last night.  He has not taken any antidiarrheal med. He noted blood on the toilet tissue when wiping last night, also some mucusy substance on the toilet tissue.  He admits that the anal area has felt irritated since the diarrhea started. No recent foreign travel, no camping, no new/raw foods in diet lately. No known sick contacts.  Past Medical History  Diagnosis Date  . Hypertension   . Hyperlipidemia     statin-intolerant.  Marland Kitchen PAF (paroxysmal atrial fibrillation) ([place])   . Mitral regurgitation   . History of pulmonary embolism 2011; 10/2014    Recurrence off of anticoagulation 10/2014: per hem/onc ([name]) pt needs lifelong anticoagulation (hypercoag w/u neg).  . Aortic regurgitation 04/2007 surgery    Resolved with [facility]R at [facility]  . History of prostate cancer 2003    Rad prost  . Anticoagulants causing adverse effect in therapeutic use   . Migraine syndrome   . Cervical spondylosis     ESI by [name] in Ortho  . Chronic lymphocytic leukemia (CLL), B-cell ([place]) approx 2012    stable on f/u's with [name] (most recent 10/2015)  . Pulmonary nodule, right 2015    RLL: resected at [facility] --VATS; Benign RLL nodule (fungal and AFB stains neg.  Plan is for [facility] thoracic  surg to do f/u o/v & CT chest 1 yr   . Anxiety     Paxil seemed to cause odd neuro side effects; better on prozac as of 09/2015  . Panic attacks     "           "             "                  "                  "                       "                             "                           Past Surgical History  Procedure Laterality Date  . Cardiac catheterization  04/2010    Normal coronaries.  . Penile prosthesis implant    . Prostatectomy  04/2002    for prostate cancer  . Cataract extraction      L 12/06/09   R 09/14/09  . Aortic valve replacement  2011  [place]   . Colonoscopy  01/26/13    tic's, o/w normal.  ([facility])--recall 10 yrs.  . Transthoracic echocardiogram  09/20/2014    mild LVH, EF 50-55%, wall motion nl, grade I diast dysfxn, prosth aort valve good,transaortic gradients decreased compared to echo 11/2013.   . Carotid dopplers  08/2015    NORMAL  . Pulmonary nodule resection  Fall/winter 2016    [facility]   MEDS: not taking diclofenac listed below Outpatient Prescriptions Prior to Visit  Medication Sig Dispense Refill  . FLUoxetine (PROZAC) 10 MG capsule Take 1 capsule (10 mg total) by mouth daily. 90 capsule 3  . imipramine (TOFRANIL) 10 MG tablet Take 10 mg by mouth at bedtime.    Marland Kitchen LORazepam (ATIVAN) 0.5 MG tablet Take 1 tablet (0.5 mg total) by mouth daily as needed for anxiety. 30 tablet 5  . metoprolol succinate (TOPROL-XL) 25 MG 24 hr tablet TAKE 1 TABLET BY MOUTH EVERY DAY 30 tablet 0  . rivaroxaban (XARELTO) 20 MG TABS tablet Take 1 tablet (20 mg total) by mouth daily with supper. 30 tablet 6  . diclofenac (VOLTAREN) 75 MG EC tablet Take 25 mg by mouth 2 (two) times daily. Reported on 12/14/2015     No facility-administered medications prior to visit.    Allergies  Allergen Reactions  . Hydrocodone-Acetaminophen Shortness Of Breath  . Oxycodone Hcl Shortness Of Breath  . Telmisartan-Hctz Shortness Of Breath and Palpitations    Dizziness, too  . Ramipril    . Aspirin Other (See Comments) and Rash    Other Reaction: confusion Can't take high dose  . Atorvastatin Other (See Comments)    myalgia  . Buspirone Hcl Other (See Comments)    headache  . Codeine Nausea Only and Other (See Comments)    Other Reaction: confusion  . Codeine Phosphate Itching and Nausea Only  . Hydrocodone-Acetaminophen Nausea Only and Other (See Comments)    Other Reaction: confusion  . Morphine Sulfate Itching  . Paroxetine Other (See Comments)    Paresthesias: R arm, R leg, r side of face  . Rabeprazole Other (See Comments)    headache    ROS As per HPI  PE: Blood pressure 128/85, pulse 77, temperature 97.9 F (36.6 C), temperature source Oral, resp. rate 16, height _0  (1.778 m), weight 204 lb 8 oz (92.761 kg), SpO2 99 %. Gen: Alert, well appearing.  Patient is oriented to person, place, time, and situation. CV: RRR, no m/r/g.   LUNGS: CTA bilat, nonlabored resps, good aeration in all lung fields. ABD: soft, ND, BS normal.  Mild discomfort/fullness to palpation diffusely, with additional "soreness" feeling to palpation in lower abdomen>upper abdomen.  Mild RLQ TTP without guarding or rebound. No mass, no bruit, no organomegaly. Pt declined anal/rectal exam today.  LABS:  none  IMPRESSION AND PLAN:  Diarrheal illness, initially blood diarrhea but as time has gone on the blood has gone away. His sx's are letting up, but I did go ahead and give him a stool collection kit so we could check C diff PCR, giardia, bacterial culture, rotavirus, fecal lactoferrin.  No meds recommended at this time. We discussed possibly checking a CBC today given the fact that he is on xarelto but we agreed that we could forego this test at this time.  An After Visit Summary was printed and given to the patient.  FOLLOW UP: Return if symptoms worsen or fail to improve.

## 2015-12-18 ENCOUNTER — Telehealth: Payer: Self-pay | Admitting: *Deleted

## 2015-12-18 NOTE — Telephone Encounter (Signed)
Pt LMOM on 12/18/15 at 2:08pm stating that his stomach issues have calmed down/improved. He wants to know does he still need to do the stool test. Please advise. Thanks.

## 2015-12-18 NOTE — Telephone Encounter (Signed)
Pt advised and voiced understanding.   

## 2015-12-18 NOTE — Telephone Encounter (Signed)
No, he does not have to do the stool sample collection if his symptoms have gone away or are [facility] improved.-thx

## 2015-12-21 ENCOUNTER — Ambulatory Visit (INDEPENDENT_AMBULATORY_CARE_PROVIDER_SITE_OTHER): Payer: 59 | Admitting: Psychology

## 2015-12-21 DIAGNOSIS — F4321 Adjustment disorder with depressed mood: Secondary | ICD-10-CM | POA: Diagnosis not present

## 2015-12-28 ENCOUNTER — Other Ambulatory Visit: Payer: Self-pay | Admitting: Cardiovascular Disease

## 2016-01-04 ENCOUNTER — Ambulatory Visit (INDEPENDENT_AMBULATORY_CARE_PROVIDER_SITE_OTHER): Payer: 59 | Admitting: Psychology

## 2016-01-04 DIAGNOSIS — F4321 Adjustment disorder with depressed mood: Secondary | ICD-10-CM

## 2016-01-08 ENCOUNTER — Other Ambulatory Visit: Payer: Self-pay | Admitting: *Deleted

## 2016-01-08 MED ORDER — LORAZEPAM 0.5 MG PO TABS: 0.5000 mg | ORAL_TABLET | Freq: Every day | ORAL | Status: DC | PRN

## 2016-01-08 NOTE — Telephone Encounter (Signed)
Fax from [facility]. [phone]  RF request for lorazepam LOV: 11/15/15 Next ov: 05/27/16 Last written: 07/10/15 #30 w/ 5RF  Please advise. Thanks.

## 2016-01-09 ENCOUNTER — Telehealth: Payer: Self-pay | Admitting: Family Medicine

## 2016-01-09 NOTE — Telephone Encounter (Signed)
Please advise. Thanks.  

## 2016-01-09 NOTE — Telephone Encounter (Signed)
Pt would like a suggestion as what he can take OTC for sinus issues. He has a deviated septum and is cautions about not increasing his blood pressure. He has tried Mucinex but it was helpful.

## 2016-01-09 NOTE — Telephone Encounter (Signed)
Rx faxed

## 2016-01-09 NOTE — Telephone Encounter (Signed)
Flonase otc nasal spray or nasacort otc nasal spray are both good (use one or the other).

## 2016-01-10 NOTE — Telephone Encounter (Signed)
Left message for pt to call back  °

## 2016-01-10 NOTE — Telephone Encounter (Signed)
Pt advised and voiced understanding.   

## 2016-01-25 ENCOUNTER — Other Ambulatory Visit: Payer: Self-pay | Admitting: *Deleted

## 2016-01-25 MED ORDER — RIVAROXABAN 20 MG PO TABS: 20.0000 mg | ORAL_TABLET | Freq: Every day | ORAL | Status: DC

## 2016-01-25 NOTE — Telephone Encounter (Signed)
RF request for xarelto LOV: 12/14/15 Next ov: 05/27/16 Last written: 06/30/15 #30 w/ 6RF

## 2016-01-31 ENCOUNTER — Ambulatory Visit: Payer: Medicare Other | Admitting: Family Medicine

## 2016-02-01 ENCOUNTER — Ambulatory Visit (INDEPENDENT_AMBULATORY_CARE_PROVIDER_SITE_OTHER): Payer: 59 | Admitting: Psychology

## 2016-02-01 DIAGNOSIS — F4321 Adjustment disorder with depressed mood: Secondary | ICD-10-CM

## 2016-02-29 ENCOUNTER — Ambulatory Visit (INDEPENDENT_AMBULATORY_CARE_PROVIDER_SITE_OTHER): Payer: 59 | Admitting: Psychology

## 2016-02-29 DIAGNOSIS — F4321 Adjustment disorder with depressed mood: Secondary | ICD-10-CM | POA: Diagnosis not present

## 2016-03-07 ENCOUNTER — Other Ambulatory Visit: Payer: Self-pay | Admitting: *Deleted

## 2016-03-07 MED ORDER — IMIPRAMINE HCL 10 MG PO TABS: 10.0000 mg | ORAL_TABLET | Freq: Every day | ORAL | Status: DC

## 2016-03-07 NOTE — Telephone Encounter (Signed)
RF request for imipramine LOV: 11/15/15 Next ov: 05/27/16 Last written: 06/27/15 #30 w/ 11RF  Please advise. Thanks.

## 2016-03-08 ENCOUNTER — Other Ambulatory Visit: Payer: Self-pay | Admitting: *Deleted

## 2016-03-08 MED ORDER — IMIPRAMINE HCL 10 MG PO TABS: 10.0000 mg | ORAL_TABLET | Freq: Every day | ORAL | Status: DC

## 2016-03-08 NOTE — Telephone Encounter (Signed)
Rx was just approved by Dr. Anitra Lauth on 03/07/16 for #30 w/ 12RF. [facility] stating pt would like 90 day supply. Rx resent for #90 w/ 3RF.

## 2016-03-28 ENCOUNTER — Ambulatory Visit (INDEPENDENT_AMBULATORY_CARE_PROVIDER_SITE_OTHER): Payer: 59 | Admitting: Psychology

## 2016-03-28 DIAGNOSIS — F4321 Adjustment disorder with depressed mood: Secondary | ICD-10-CM

## 2016-03-30 NOTE — Progress Notes (Addendum)
Patient ID: [name], male   DOB: Oct 05, 1946, 70 y.o.   MRN: [id number]    Cardiology Office Note   Date:  04/02/2016   ID:  [name], DOB 07-Feb-1946, MRN [id number]  PCP:  [name], MD  Cardiologist:  [name]     Chief Complaint  Patient presents with  . Hypertension    no sx     History of Present Illness: [name] is a 70 y.o. male with a hx of severe aortic insufficiency, status post bioprosthetic AVR by [name] at [facility] in 05/2010, normal coronary arteries by cardiac catheterization in 2011, prior DVT, persistent LUE basilic/cephalic thrombus, recurrent pulmonary emboli (most recently 10/2014), PAF, Xarelto anticoagulation, HTN, HL, CLL, prostate CA status post radical prostatectomy, RLL lung nodule. The patient has been followed by [name] with oncology. PET scan has been negative for hypermetabolic activity regarding his lung nodule. However, recent chest CT demonstrated increasing size. Of note, there was resolution of pulmonary emboli. Resection has been recommended. The patient is being referred to a thoracic surgeon at [facility].    04/03/15  Had throascopic RLL wedge resection [place] Turned out to be benign granulomatous nodule And not cancer    Studies/Reports Reviewed Today:  Chest CTA 02/06/15 IMPRESSION: 1. Previously described pulmonary emboli have resolved. There are no new pulmonary emboli. 2. Right lower lobe pulmonary nodule has increased in size. It shows no appreciable hyper metabolism on the recent prior PET-CT. That given the size increase, biopsy should be considered. 3. No acute findings in the lungs. 4. Stable mild cardiomegaly and stable changes from previous cardiac surgery and aortic valve replacement.  Echo 09/20/14 - Mild concentric hypertrophy. EF 50% to 55%. Wallmotion was normal; Grade 1 diastolic dysfunction. - Aortic valve: A bioprosthetic valve sits well in the aorticposition. There is no central AI or  paravalvular leak. Peak andmean gradients are 30 and 16 mmHg, respectively and are normalfor this type of prosthesis. Mean gradient (S): 16 mm Hg. Peakgradient (S): 30 mm Hg. - Aortic root: The aortic root was normal in size. - Mitral valve: Structurally normal valve. There was mildregurgitation. - Left atrium: The atrium was mildly dilated. - Right ventricle: Systolic function was normal. - Right atrium: The atrium was normal in size. - Pulmonic valve: There was mild regurgitation. - Pulmonary arteries: Systolic pressure was within the normalrange. - Inferior vena cava: The vessel was normal in size. - Pericardium, extracardiac: There was no pericardial effusion. Impressions: When compared to the study from 11/26/2013 transaortic gradientsare now decreased.  Upper Ext Venous Duplex 07/2014 Acute superficial thrombus noted in the left mid to distal basilic and mid cephalic vein. All other left upper extremity deep and superficial veins, show no evidence of intraluminal thrombus.  LHC 04/2010 Normal coronary arteries  08/2015  Normal carotid duplex  Reviewed his echo from today . AVR more thickened and peak gradient up from 30- >40 mmHg.  Also reviewed labs from [place]  Cr 1.7 and LDL 189  Disscussed issues of lipid control for deteriorating tissue valves    Past Medical History  Diagnosis Date  . Hypertension   . Hyperlipidemia     statin-intolerant.  Marland Kitchen PAF (paroxysmal atrial fibrillation) ([place])   . Mitral regurgitation   . History of pulmonary embolism 2011; 10/2014    Recurrence off of anticoagulation 10/2014: per hem/onc ([name]) pt needs lifelong anticoagulation (hypercoag w/u neg).  . Aortic regurgitation 04/2007 surgery    Resolved with  tissue AVR at [facility]  . History of prostate cancer 2003    Rad prost  . Anticoagulants causing adverse effect in therapeutic use   . Migraine syndrome   . Cervical spondylosis     ESI by [name] in Ortho  . Chronic lymphocytic  leukemia (CLL), B-cell ([place]) approx 2012    stable on f/u's with [name] (most recent 10/2015)  . Pulmonary nodule, right 2015    RLL: resected at [facility] --VATS; Benign RLL nodule (fungal and AFB stains neg.  Plan is for [facility] thoracic surg to do f/u o/v & CT chest 1 yr   . Anxiety     Paxil seemed to cause odd neuro side effects; better on prozac as of 09/2015  . Panic attacks     "           "             "                  "                  "                       "                             "                           Past Surgical History  Procedure Laterality Date  . Cardiac catheterization  04/2010    Normal coronaries.  . Penile prosthesis implant    . Prostatectomy  04/2002    for prostate cancer  . Cataract extraction      L 12/06/09   R 09/14/09  . Aortic valve replacement  2011    [place]   . Colonoscopy  01/26/13    tic's, o/w normal.  ([facility])--recall 10 yrs.  . Transthoracic echocardiogram  09/20/2014    mild LVH, EF 50-55%, wall motion nl, grade I diast dysfxn, prosth aort valve good,transaortic gradients decreased compared to echo 11/2013.   . Carotid dopplers  08/2015    NORMAL  . Pulmonary nodule resection  Fall/winter 2016    [facility]     Current Outpatient Prescriptions  Medication Sig Dispense Refill  . FLUoxetine (PROZAC) 10 MG capsule Take 1 capsule (10 mg total) by mouth daily. 90 capsule 3  . imipramine (TOFRANIL) 10 MG tablet Take 1 tablet (10 mg total) by mouth at bedtime. 90 tablet 3  . LORazepam (ATIVAN) 0.5 MG tablet Take 1 tablet (0.5 mg total) by mouth daily as needed for anxiety. 30 tablet 5  . metoprolol succinate (TOPROL-XL) 25 MG 24 hr tablet TAKE 1 TABLET BY MOUTH EVERY DAY 30 tablet 10  . rivaroxaban (XARELTO) 20 MG TABS tablet Take 1 tablet (20 mg total) by mouth daily with supper. 30 tablet 6   No current facility-administered medications for this visit.    Allergies:   Hydrocodone-acetaminophen; Oxycodone hcl; Telmisartan-hctz; Ramipril;  Aspirin; Atorvastatin; Buspirone hcl; Codeine; Codeine phosphate; Hydrocodone-acetaminophen; Morphine sulfate; Paroxetine; and Rabeprazole    Social History:  The patient  reports that he quit smoking about 35 years ago. His smoking use included Cigarettes. He started smoking about 47 years ago. He has a 12 pack-year smoking history. He has never used smokeless tobacco. He  reports that he drinks alcohol. He reports that he does not use illicit drugs.   Family History:  The patient's family history includes Cancer in his sister; Colon cancer (age of onset: 82) in his sister; Hyperlipidemia in his other; Hypertension in his other; Stroke in his brother, mother, and sister. There is no history of Heart attack.    ROS:   Please see the history of present illness.   Review of Systems  All other systems reviewed and are negative.     PHYSICAL EXAM: VS:  BP 140/84 mmHg  Pulse 62  Ht 5\' 10"  (1.778 m)  Wt 90.719 kg (200 lb)  BMI 28.70 kg/m2  SpO2 97%    Wt Readings from Last 3 Encounters:  04/02/16 90.719 kg (200 lb)  12/14/15 92.761 kg (204 lb 8 oz)  12/04/15 93.704 kg (206 lb 9.3 oz)     GEN: Well nourished, well developed, in no acute distress HEENT: normal Neck: no JVD, left  carotid bruits, no masses Cardiac:  Normal S1/accentuated S2, RRR; no murmur ,  no rubs or gallops, no edema  Respiratory:  clear to auscultation bilaterally, no wheezing, rhonchi or rales. GI: soft, nontender, nondistended, + BS MS: no deformity or atrophy Skin: warm and dry  Neuro:  CNs II-XII intact, Strength and sensation are intact Psych: Normal affect   EKG:   03/22/15  NSR, HR 67, LAFB, RBBB, no change from prior tracing  04/02/16 SR rate 62 LAD RBBB LVH    Recent Labs: 11/08/2015: ALT 31; HGB 14.5; Platelets 148 11/22/2015: BUN 22; Creatinine, Ser 1.19; Potassium 5.2*; Sodium 142    Lipid Panel    Component Value Date/Time   CHOL 320* 11/22/2015 0852   TRIG 178.0* 11/22/2015 0852   HDL 46.50  11/22/2015 0852   CHOLHDL 7 11/22/2015 0852   VLDL 35.6 11/22/2015 0852   LDLCALC 238* 11/22/2015 0852   LDLDIRECT 169.0 03/30/2013 0936      ASSESSMENT AND PLAN:  Aortic insufficiency S/P Bioprosthetic AVR (aortic valve replacement) in 2011 Increased gradients on echo today continue anticoagulation. Add statin see below fu echo in a year   History of DVT (deep vein thrombosis) and Recurrent pulmonary embolism Continue Xarelto. This is managed by heme/onc.   Paroxysmal atrial fibrillation Maintaining NSR.  Continue Toprol-XL and Xarelto.  Essential hypertension Borderline control.  Continue to monitor.  HLD (hyperlipidemia) Has had leg pain with statins in past. Discussed importance of low LDL for tissue valve preservation Start crestor  5 mg M/W/F fu with lipid clinic and labs in 3 months .  Lung nodule  Post VATS at [facility]  Benign nodule healing well   Carotid Bruit:  Left duplex no stenosis f/u 08/2017  Depression:  Better off paroxetine now on low dose prozac with tofranil  CRF: not clear if he was dehydrated. Not on any nephrotoxic drugs f/u BMET in 8 weeks with primary  BUN 23 Cr 1.7  BBB:  RBBB LAD post AVR ECG q 6 months follow for more advanced AV block    Current medicines are reviewed at length with the patient today.  Concerns regarding medicines are as outlined above.  The following changes have been made:    Added crestor   Jenkins Rouge

## 2016-04-02 ENCOUNTER — Ambulatory Visit (HOSPITAL_COMMUNITY): Payer: Medicare Other | Attending: Cardiology

## 2016-04-02 ENCOUNTER — Ambulatory Visit (INDEPENDENT_AMBULATORY_CARE_PROVIDER_SITE_OTHER): Payer: Medicare Other | Admitting: Cardiovascular Disease

## 2016-04-02 ENCOUNTER — Encounter: Payer: Self-pay | Admitting: Cardiovascular Disease

## 2016-04-02 ENCOUNTER — Other Ambulatory Visit: Payer: Self-pay

## 2016-04-02 VITALS — BP 140/84 | HR 62 | Ht 70.0 in | Wt 200.0 lb

## 2016-04-02 DIAGNOSIS — Z79899 Other long term (current) drug therapy: Secondary | ICD-10-CM

## 2016-04-02 DIAGNOSIS — I10 Essential (primary) hypertension: Secondary | ICD-10-CM

## 2016-04-02 DIAGNOSIS — I4891 Unspecified atrial fibrillation: Secondary | ICD-10-CM | POA: Diagnosis not present

## 2016-04-02 DIAGNOSIS — E785 Hyperlipidemia, unspecified: Secondary | ICD-10-CM

## 2016-04-02 DIAGNOSIS — I351 Nonrheumatic aortic (valve) insufficiency: Secondary | ICD-10-CM | POA: Insufficient documentation

## 2016-04-02 MED ORDER — ROSUVASTATIN CALCIUM 5 MG PO TABS: 5.0000 mg | ORAL_TABLET | Freq: Every day | ORAL | Status: DC

## 2016-04-02 NOTE — Addendum Note (Signed)
Addended by: Devra Dopp E on: 04/02/2016 04:32 PM   Modules accepted: Orders

## 2016-04-02 NOTE — Patient Instructions (Addendum)
Medication Instructions: START CRESTOR   5 MG  Monday Wednesday AND  Friday  Labwork:3 MONTHS   FASTING LIPID LIVER   Testing/Procedures: NONE  Follow-Up: Your physician recommends that you schedule a follow-up appointment in:   [place]. You have been referred to LIPID CLINIC  3 MONTHS   Any Other Special Instructions Will Be Listed Below (If Applicable).     If you need a refill on your cardiac medications before your next appointment, please call your pharmacy.

## 2016-04-24 ENCOUNTER — Ambulatory Visit (INDEPENDENT_AMBULATORY_CARE_PROVIDER_SITE_OTHER): Payer: 59 | Admitting: Psychology

## 2016-04-24 DIAGNOSIS — F4321 Adjustment disorder with depressed mood: Secondary | ICD-10-CM

## 2016-05-23 ENCOUNTER — Ambulatory Visit: Payer: 59 | Admitting: Psychology

## 2016-05-27 ENCOUNTER — Ambulatory Visit: Payer: Medicare Other | Admitting: Family Medicine

## 2016-05-28 ENCOUNTER — Encounter: Payer: Self-pay | Admitting: Family Medicine

## 2016-05-28 ENCOUNTER — Ambulatory Visit (INDEPENDENT_AMBULATORY_CARE_PROVIDER_SITE_OTHER): Payer: Medicare Other | Admitting: Family Medicine

## 2016-05-28 VITALS — BP 133/89 | HR 67 | Temp 98.2°F | Resp 16 | Ht 70.0 in | Wt 202.2 lb

## 2016-05-28 DIAGNOSIS — F411 Generalized anxiety disorder: Secondary | ICD-10-CM

## 2016-05-28 DIAGNOSIS — F329 Major depressive disorder, single episode, unspecified: Secondary | ICD-10-CM

## 2016-05-28 DIAGNOSIS — E785 Hyperlipidemia, unspecified: Secondary | ICD-10-CM

## 2016-05-28 DIAGNOSIS — F32A Depression, unspecified: Secondary | ICD-10-CM

## 2016-05-28 NOTE — Progress Notes (Signed)
OFFICE VISIT  05/28/2016   CC:  Chief Complaint  Patient presents with  . Follow-up    Pt is not fasting.    HPI:    Patient is a 71 y.o. African-American male who presents for 6 mo f/u anxiety d/o, hyperlipidemia. He has seen MDs at the [place] since I last saw him. They are concerned about a chronic renal insufficiency and are going to do some further testing/imaging. He has also been started on crestor by Dr. [name] around 2 mo ago.  He is tolerating this med fine at this time.  Not fasting today.  Taking prozac daily and feels satisfied it is helping with his depression and chronic anxiety. Taking ativan less frequently than before.  No acute complaints.  Past Medical History  Diagnosis Date  . Hypertension   . Hyperlipidemia     statin-intolerant.  Marland Kitchen PAF (paroxysmal atrial fibrillation) ([place])   . Mitral regurgitation   . History of pulmonary embolism 2011; 10/2014    Recurrence off of anticoagulation 10/2014: per hem/onc ([name]) pt needs lifelong anticoagulation (hypercoag w/u neg).  . Aortic regurgitation 04/2007 surgery    Resolved with tissue AVR at [facility]  . History of prostate cancer 2003    Rad prost  . Anticoagulants causing adverse effect in therapeutic use   . Migraine syndrome   . Cervical spondylosis     ESI by [name] in Ortho  . Chronic lymphocytic leukemia (CLL), B-cell ([place]) approx 2012    stable on f/u's with [name] (most recent 10/2015)  . Pulmonary nodule, right 2015    RLL: resected at [facility] --VATS; Benign RLL nodule (fungal and AFB stains neg.  Plan is for [facility] thoracic surg to do f/u o/v & CT chest 1 yr   . Anxiety     Paxil seemed to cause odd neuro side effects; better on prozac as of 09/2015  . Panic attacks     "           "             "                  "                  "                       "                             "                           Past Surgical History  Procedure Laterality Date  . Cardiac catheterization   04/2010    Normal coronaries.  . Penile prosthesis implant    . Prostatectomy  04/2002    for prostate cancer  . Cataract extraction      L 12/06/09   R 09/14/09  . Aortic valve replacement  2011    [facility]   . Colonoscopy  01/26/13    tic's, o/w normal.  ([facility])--recall 10 yrs.  . Transthoracic echocardiogram  09/20/2014    mild LVH, EF 50-55%, wall motion nl, grade I diast dysfxn, prosth aort valve good,transaortic gradients decreased compared to echo 11/2013.   . Carotid dopplers  08/2015    NORMAL  . Pulmonary nodule resection  Fall/winter  2016    [facility]    Outpatient Prescriptions Prior to Visit  Medication Sig Dispense Refill  . FLUoxetine (PROZAC) 10 MG capsule Take 1 capsule (10 mg total) by mouth daily. 90 capsule 3  . imipramine (TOFRANIL) 10 MG tablet Take 1 tablet (10 mg total) by mouth at bedtime. 90 tablet 3  . LORazepam (ATIVAN) 0.5 MG tablet Take 1 tablet (0.5 mg total) by mouth daily as needed for anxiety. 30 tablet 5  . metoprolol succinate (TOPROL-XL) 25 MG 24 hr tablet TAKE 1 TABLET BY MOUTH EVERY DAY 30 tablet 10  . rivaroxaban (XARELTO) 20 MG TABS tablet Take 1 tablet (20 mg total) by mouth daily with supper. 30 tablet 6  . rosuvastatin (CRESTOR) 5 MG tablet Take 1 tablet (5 mg total) by mouth daily. 90 tablet 3   No facility-administered medications prior to visit.    Allergies  Allergen Reactions  . Hydrocodone-Acetaminophen Shortness Of Breath  . Oxycodone Hcl Shortness Of Breath  . Telmisartan-Hctz Shortness Of Breath and Palpitations    Dizziness, too  . Ramipril   . Aspirin Other (See Comments) and Rash    Other Reaction: confusion Can't take high dose  . Atorvastatin Other (See Comments)    myalgia  . Buspirone Hcl Other (See Comments)    headache  . Codeine Nausea Only and Other (See Comments)    Other Reaction: confusion  . Codeine Phosphate Itching and Nausea Only  . Hydrocodone-Acetaminophen Nausea Only and Other (See Comments)    Other  Reaction: confusion  . Morphine Sulfate Itching  . Paroxetine Other (See Comments)    Paresthesias: R arm, R leg, r side of face  . Rabeprazole Other (See Comments)    headache    ROS As per HPI  PE: Blood pressure 133/89, pulse 67, temperature 98.2 F (36.8 C), temperature source Oral, resp. rate 16, height 5\' 10"  (1.778 m), weight 202 lb 4 oz (91.74 kg), SpO2 99 %. Gen: Alert, well appearing.  Patient is oriented to person, place, time, and situation. AFFECT: pleasant, lucid thought and speech. No further exam today.  LABS:  Lab Results  Component Value Date   TSH 1.93 04/11/2014   Lab Results  Component Value Date   WBC 13.0* 11/08/2015   HGB 14.5 11/08/2015   HCT 44.2 11/08/2015   MCV 96 11/08/2015   PLT 148 11/08/2015   Lab Results  Component Value Date   CREATININE 1.19 11/22/2015   BUN 22 11/22/2015   NA 142 11/22/2015   K 5.2* 11/22/2015   CL 105 11/22/2015   CO2 32 11/22/2015   Lab Results  Component Value Date   ALT 31 11/08/2015   AST 20 11/08/2015   ALKPHOS 64 11/08/2015   BILITOT 0.35 11/08/2015   Lab Results  Component Value Date   CHOL 320* 11/22/2015   Lab Results  Component Value Date   HDL 46.50 11/22/2015   Lab Results  Component Value Date   LDLCALC 238* 11/22/2015   Lab Results  Component Value Date   TRIG 178.0* 11/22/2015   Lab Results  Component Value Date   CHOLHDL 7 11/22/2015   Lab Results  Component Value Date   PSA 0.01* 11/16/2014   PSA 0.00* 03/30/2013   PSA 0.00 Repeated and verified X2.* 03/22/2011   Lab Results  Component Value Date   HGBA1C 5.7 11/29/2015   IMPRESSION AND PLAN:  1) Hyperlipidemia: tolerating crestor 5mg  qd.  Not fasting today. Recheck lipid panel at CPE  that he'll arrange at his convenience ([facility]).  2) GAD with depression: doing fine on prozac 10mg  qd.  3) Question of kidney dysfunction/dz---[facility] [place] via a recent  workup. Will try to get records.  An After Visit Summary was printed and given to the patient.  FOLLOW UP: Return for annual CPE (fasting)--pt to arrange at his convenience.  Signed:  Crissie Sickles, MD           05/28/2016

## 2016-05-28 NOTE — Progress Notes (Signed)
Pre visit review using our clinic review tool, if applicable. No additional management support is needed unless otherwise documented below in the visit note. 

## 2016-05-31 ENCOUNTER — Ambulatory Visit (INDEPENDENT_AMBULATORY_CARE_PROVIDER_SITE_OTHER): Payer: 59 | Admitting: Psychology

## 2016-05-31 DIAGNOSIS — F4321 Adjustment disorder with depressed mood: Secondary | ICD-10-CM | POA: Diagnosis not present

## 2016-06-10 ENCOUNTER — Telehealth: Payer: Self-pay | Admitting: Family Medicine

## 2016-06-10 NOTE — Telephone Encounter (Signed)
Pt has future labs. Please advise if it is okay to do all or just the lipid. Thanks.

## 2016-06-10 NOTE — Telephone Encounter (Signed)
Patient is calling bc he would like to have a lipid panel drawn. He is not able to schedule a CPE right now but does not want to wait. Mentioned that the [place] raised some concerns around his cholesterol. He has also requested the records from the [place] for you but has not received them yet.

## 2016-06-11 NOTE — Telephone Encounter (Signed)
Just the lipid, dx is hyperlipidemia.-thx

## 2016-06-12 NOTE — Progress Notes (Signed)
Patient ID: [name], male   DOB: August 06, 1946, 70 y.o.   MRN: [id number]    Cardiology Office Note   Date:  06/14/2016   ID:  [name], DOB 04-27-1946, MRN [id number]  PCP:  [name], MD  Cardiologist:  [name]     Chief Complaint  Patient presents with  . Aortic Insuffiency    getiing headaches in right side of head & ewar     History of Present Illness: [name] is a 70 y.o. male with a hx of severe aortic insufficiency, status post bioprosthetic AVR by [name] at [facility] in 05/2010, normal coronary arteries by cardiac catheterization in 2011, prior DVT, persistent LUE basilic/cephalic thrombus, recurrent pulmonary emboli (most recently 10/2014), PAF, Xarelto anticoagulation, HTN, HL, CLL, prostate CA status post radical prostatectomy, RLL lung nodule. The patient has been followed by [name] with oncology. PET scan has been negative for hypermetabolic activity regarding his lung nodule. However, recent chest CT demonstrated increasing size. Of note, there was resolution of pulmonary emboli. Resection has been recommended. The patient is being referred to a thoracic surgeon at [facility].    04/03/15  Had throascopic RLL wedge resection [place] Turned out to be benign granulomatous nodule And not cancer    Studies/Reports Reviewed Today:  Chest CTA 02/06/15 IMPRESSION: 1. Previously described pulmonary emboli have resolved. There are no new pulmonary emboli. 2. Right lower lobe pulmonary nodule has increased in size. It shows no appreciable hyper metabolism on the recent prior PET-CT. That given the size increase, biopsy should be considered. 3. No acute findings in the lungs. 4. Stable mild cardiomegaly and stable changes from previous cardiac surgery and aortic valve replacement.  Echo 04/02/16   Study Conclusions  - Left ventricle: The cavity size was mildly dilated. Wall   thickness was increased in a pattern of severe LVH. Systolic   function  was normal. The estimated ejection fraction was in the   range of 55% to 60%. Left ventricular diastolic function   parameters were normal. - Aortic valve: Mild thickening of leaflets no AR mean gradient   increased from 16 to 24 and peak from 30 to 40 mmHg since   09/2014. - Left atrium: The atrium was mildly dilated. - Atrial septum: No defect or patent foramen ovale was identified.  Upper Ext Venous Duplex 07/2014 Acute superficial thrombus noted in the left mid to distal basilic and mid cephalic vein. All other left upper extremity deep and superficial veins, show no evidence of intraluminal thrombus.  LHC 04/2010 Normal coronary arteries  08/2015  Normal carotid duplex  Reviewed his echo from today . AVR more thickened and peak gradient up from 30- >40 mmHg.  Also reviewed labs from [place]  Cr 1.7 and LDL 189  Disscussed issues of lipid control for deteriorating tissue valves   Switching primary to Dr Yong Channel . Had lipids done by Dr Ernestine Conrad recently  Bad headache this am   Past Medical History:  Diagnosis Date  . Anticoagulants causing adverse effect in therapeutic use   . Anxiety    Paxil seemed to cause odd neuro side effects; better on prozac as of 09/2015  . Aortic regurgitation 04/2007 surgery   Resolved with [facility]R at [facility]  . Cervical spondylosis    ESI by [name] in Ortho  . Chronic lymphocytic leukemia (CLL), B-cell ([place]) approx 2012   stable on f/u's with [name] (most recent 10/2015)  . History of prostate  cancer 2003   Rad prost  . History of pulmonary embolism 2011; 10/2014   Recurrence off of anticoagulation 10/2014: per hem/onc ([name]) pt needs lifelong anticoagulation (hypercoag w/u neg).  . Hyperlipidemia    statin-intolerant, except for crestor low dose.  Marland Kitchen Hypertension   . Migraine syndrome   . Mitral regurgitation   . PAF (paroxysmal atrial fibrillation) ([place])   . Panic attacks    "           "             "                  "                   "                       "                             "                         . Pulmonary nodule, right 2015   RLL: resected at [facility] --VATS; Benign RLL nodule (fungal and AFB stains neg.  Plan is for [facility] thoracic surg to do f/u o/v & CT chest 1 yr     Past Surgical History:  Procedure Laterality Date  . AORTIC VALVE REPLACEMENT  2011   [facility]  (bioprosthetic)  . CARDIAC CATHETERIZATION  04/2010   Normal coronaries.  . Carotid dopplers  08/2015   NORMAL  . CATARACT EXTRACTION     L 12/06/09   R 09/14/09  . COLONOSCOPY  01/26/13   tic's, o/w normal.  ([facility])--recall 10 yrs.  Marland Kitchen PENILE PROSTHESIS IMPLANT    . PROSTATECTOMY  04/2002   for prostate cancer  . Pulmonary nodule resection  Fall/winter 2016   [facility]  . TRANSTHORACIC ECHOCARDIOGRAM  09/20/2014; 03/2016   mild LVH, EF 50-55%, wall motion nl, grade I diast dysfxn, prosth aort valve good,transaortic gradients decreased compared to echo 11/2013.   03/2016: EF 55-60%, normal LV function, severe LVH, normal diast fxn, mild increase in AV gradient compared to 09/2014 echo.     Current Outpatient Prescriptions  Medication Sig Dispense Refill  . FLUoxetine (PROZAC) 10 MG capsule Take 1 capsule (10 mg total) by mouth daily. 90 capsule 3  . imipramine (TOFRANIL) 10 MG tablet Take 1 tablet (10 mg total) by mouth at bedtime. 90 tablet 3  . LORazepam (ATIVAN) 0.5 MG tablet Take 1 tablet (0.5 mg total) by mouth daily as needed for anxiety. 30 tablet 5  . metoprolol succinate (TOPROL-XL) 25 MG 24 hr tablet TAKE 1 TABLET BY MOUTH EVERY DAY 30 tablet 10  . rivaroxaban (XARELTO) 20 MG TABS tablet Take 1 tablet (20 mg total) by mouth daily with supper. 30 tablet 6  . rosuvastatin (CRESTOR) 5 MG tablet Take 1 tablet (5 mg total) by mouth daily. 90 tablet 3   No current facility-administered medications for this visit.     Allergies:   Hydrocodone-acetaminophen; Oxycodone hcl; Telmisartan-hctz; Ramipril; Aspirin; Atorvastatin; Buspirone hcl;  Codeine; Codeine phosphate; Hydrocodone-acetaminophen; Morphine sulfate; Paroxetine; and Rabeprazole    Social History:  The patient  reports that he quit smoking about 35 years ago. His smoking use included Cigarettes. He started smoking about 47 years ago. He has a  12.00 pack-year smoking history. He has never used smokeless tobacco. He reports that he drinks alcohol. He reports that he does not use drugs.   Family History:  The patient's family history includes Cancer in his sister; Colon cancer (age of onset: 84) in his sister; Hyperlipidemia in his other; Hypertension in his other; Stroke in his brother, mother, and sister.    ROS:   Please see the history of present illness.   Review of Systems  All other systems reviewed and are negative.     PHYSICAL EXAM: VS:  BP 137/67 (BP Location: Right Arm, Patient Position: Sitting, Cuff Size: Normal)   Pulse 69   Ht 5\' 10"  (1.778 m)   Wt 201 lb 1.9 oz (91.2 kg)   SpO2 99%   BMI 28.86 kg/m     Wt Readings from Last 3 Encounters:  06/14/16 201 lb 1.9 oz (91.2 kg)  05/28/16 202 lb 4 oz (91.7 kg)  04/02/16 200 lb (90.7 kg)     GEN: Well nourished, well developed, in no acute distress  HEENT: normal  Neck: no JVD, left  carotid bruits, no masses Cardiac:  Normal S1/accentuated S2, RRR; no murmur ,  no rubs or gallops, no edema  Respiratory:  clear to auscultation bilaterally, no wheezing, rhonchi or rales. GI: soft, nontender, nondistended, + BS MS: no deformity or atrophy  Skin: warm and dry  Neuro:  CNs II-XII intact, Strength and sensation are intact Psych: Normal affect   EKG:   03/22/15  NSR, HR 67, LAFB, RBBB, no change from prior tracing  04/02/16 SR rate 62 LAD RBBB LVH    Recent Labs: 11/08/2015: ALT 31; HGB 14.5; Platelets 148 11/22/2015: BUN 22; Creatinine, Ser 1.19; Potassium 5.2; Sodium 142    Lipid Panel    Component Value Date/Time   CHOL 202 (H) 06/13/2016 0755   TRIG 97.0 06/13/2016 0755   HDL 56.60  06/13/2016 0755   CHOLHDL 4 06/13/2016 0755   VLDL 19.4 06/13/2016 0755   LDLCALC 126 (H) 06/13/2016 0755   LDLDIRECT 169.0 03/30/2013 0936      ASSESSMENT AND PLAN:  Aortic insufficiency S/P Bioprosthetic AVR (aortic valve replacement) in 2011 Increased gradients on echo continue anticoagulation. F/U echo 03/2017   History of DVT (deep vein thrombosis) and Recurrent pulmonary embolism Continue Xarelto. This is managed by heme/onc.   Paroxysmal atrial fibrillation Maintaining NSR.  Continue Toprol-XL and Xarelto.  Essential hypertension Borderline control.  Continue to monitor.  HLD (hyperlipidemia) Has had leg pain with statins in past. Discussed importance of low LDL for tissue valve preservation  Crestor tried 5 mg daily tolerating f/u labs   Lung nodule  Post VATS at [facility]  Benign nodule healing well   Carotid Bruit:  Left duplex no stenosis f/u 08/2017  Depression:  Better off paroxetine now on low dose prozac with tofranil  Headache:  F/u primary PRN tylenol ok has schrap metal on right side of head Cant have MRI  CRF: not clear if he was dehydrated. Not on any nephrotoxic drugs f/u BMET in 8 weeks with primary  BUN 23 Cr 1.7  BBB:  RBBB LAD post AVR ECG q 6 months follow for more advanced AV block    Current medicines are reviewed at length with the patient today.  Concerns regarding medicines are as outlined above.  The following changes have been made:       Jenkins Rouge

## 2016-06-12 NOTE — Telephone Encounter (Signed)
Patient advised. Appt scheduled. 

## 2016-06-13 ENCOUNTER — Encounter: Payer: Self-pay | Admitting: Family Medicine

## 2016-06-13 ENCOUNTER — Other Ambulatory Visit (INDEPENDENT_AMBULATORY_CARE_PROVIDER_SITE_OTHER): Payer: Medicare Other

## 2016-06-13 DIAGNOSIS — E785 Hyperlipidemia, unspecified: Secondary | ICD-10-CM

## 2016-06-13 LAB — LIPID PANEL
Cholesterol: 202 mg/dL — ABNORMAL HIGH (ref 0–200)
HDL: 56.6 mg/dL (ref >39.00)
LDL Cholesterol: 126 mg/dL — ABNORMAL HIGH (ref 0–99)
NonHDL: 145.16
Total CHOL/HDL Ratio: 4
Triglycerides: 97 mg/dL (ref 0.0–149.0)
VLDL: 19.4 mg/dL (ref 0.0–40.0)

## 2016-06-14 ENCOUNTER — Encounter: Payer: Self-pay | Admitting: Cardiovascular Disease

## 2016-06-14 ENCOUNTER — Telehealth: Payer: Self-pay

## 2016-06-14 ENCOUNTER — Ambulatory Visit (INDEPENDENT_AMBULATORY_CARE_PROVIDER_SITE_OTHER): Payer: Medicare Other | Admitting: Cardiovascular Disease

## 2016-06-14 VITALS — BP 137/67 | HR 69 | Ht 70.0 in | Wt 201.1 lb

## 2016-06-14 DIAGNOSIS — I351 Nonrheumatic aortic (valve) insufficiency: Secondary | ICD-10-CM

## 2016-06-14 DIAGNOSIS — Z79899 Other long term (current) drug therapy: Secondary | ICD-10-CM

## 2016-06-14 DIAGNOSIS — E7800 Pure hypercholesterolemia, unspecified: Secondary | ICD-10-CM

## 2016-06-14 LAB — CBC WITH DIFFERENTIAL/PLATELET
Basophils Absolute: 0 cells/uL (ref 0–200)
Basophils Relative: 0 %
Eosinophils Absolute: 148 cells/uL (ref 15–500)
Eosinophils Relative: 1 %
HCT: 42.4 % (ref 38.5–50.0)
Hemoglobin: 14.2 g/dL (ref 13.2–17.1)
Lymphocytes Relative: 85 %
Lymphs Abs: 12580 cells/uL — ABNORMAL HIGH (ref 850–3900)
MCH: 31.1 pg (ref 27.0–33.0)
MCHC: 33.5 g/dL (ref 32.0–36.0)
MCV: 93 fL (ref 80.0–100.0)
MPV: 11.2 fL (ref 7.5–12.5)
Monocytes Absolute: 740 cells/uL (ref 200–950)
Monocytes Relative: 5 %
Neutro Abs: 1332 cells/uL — ABNORMAL LOW (ref 1500–7800)
Neutrophils Relative %: 9 %
Platelets: 171 10*3/uL (ref 140–400)
RBC: 4.56 MIL/uL (ref 4.20–5.80)
RDW: 14.3 % (ref 11.0–15.0)
WBC: 14.8 10*3/uL — ABNORMAL HIGH (ref 3.8–10.8)

## 2016-06-14 MED ORDER — ROSUVASTATIN CALCIUM 10 MG PO TABS: ORAL_TABLET | ORAL | 3 refills | Status: DC

## 2016-06-14 NOTE — Telephone Encounter (Signed)
  Dr. [name] reviewed patient's lab work from Dr. Idelle Leech of patient's Lipid panel.  Per Dr. [name] LDL better on crestor, 126, but should be under 100. Dr. [name] would like patient to try taking crestor 10 mg on Monday, Wednesday, and Friday.

## 2016-06-14 NOTE — Patient Instructions (Addendum)
Medication Instructions:  Your physician recommends that you continue on your current medications as directed. Please refer to the Current Medication list given to you today.  Labwork: Your physician recommends that you have lab work today CBC  Testing/Procedures: NONE  Follow-Up: Your physician wants you to follow-up in: 6 months with Dr. [name]. You will receive a reminder letter in the mail two months in advance. If you don't receive a letter, please call our office to schedule the follow-up appointment.   If you need a refill on your cardiac medications before your next appointment, please call your pharmacy.

## 2016-06-17 ENCOUNTER — Telehealth: Payer: Self-pay | Admitting: Cardiovascular Disease

## 2016-06-17 LAB — PATHOLOGIST SMEAR REVIEW

## 2016-06-17 NOTE — Telephone Encounter (Signed)
Pt calling to clarify how much crestor Dr [name] wants for him to take.  Informed the pt of instructions below, given by Dr Kyla Balzarine RN a couple of days ago:   Dr. [name] reviewed patient's lab work from Dr. Idelle Leech of patient's Lipid panel.  Per Dr. [name] LDL better on crestor, 126, but should be under 100. Dr. [name] would like patient to try taking crestor 10 mg po on Monday, Wednesday, and Friday.  Pt verbalized understanding and agrees with this plan.

## 2016-06-17 NOTE — Telephone Encounter (Signed)
New Message:    Please call,question about his Crestor.

## 2016-06-19 ENCOUNTER — Telehealth: Payer: Self-pay | Admitting: Cardiovascular Disease

## 2016-06-19 NOTE — Telephone Encounter (Signed)
Called patient with lab results.  

## 2016-06-19 NOTE — Telephone Encounter (Signed)
New message ° ° ° ° °Returning a call to the nurse °

## 2016-06-19 NOTE — Telephone Encounter (Signed)
Follow up    Pt verbalized that he is calling to leave call number for rn

## 2016-06-20 ENCOUNTER — Ambulatory Visit (INDEPENDENT_AMBULATORY_CARE_PROVIDER_SITE_OTHER): Payer: 59 | Admitting: Psychology

## 2016-06-20 DIAGNOSIS — F4321 Adjustment disorder with depressed mood: Secondary | ICD-10-CM

## 2016-06-21 ENCOUNTER — Ambulatory Visit (INDEPENDENT_AMBULATORY_CARE_PROVIDER_SITE_OTHER): Payer: Medicare Other | Admitting: Family Medicine

## 2016-06-21 ENCOUNTER — Ambulatory Visit: Payer: Medicare Other

## 2016-06-21 ENCOUNTER — Encounter: Payer: Self-pay | Admitting: Family Medicine

## 2016-06-21 ENCOUNTER — Telehealth: Payer: Self-pay | Admitting: *Deleted

## 2016-06-21 ENCOUNTER — Telehealth: Payer: Self-pay | Admitting: Family Medicine

## 2016-06-21 ENCOUNTER — Other Ambulatory Visit: Payer: Self-pay

## 2016-06-21 VITALS — BP 120/90 | HR 66 | Temp 98.3°F | Resp 16 | Ht 70.0 in | Wt 202.2 lb

## 2016-06-21 DIAGNOSIS — G44201 Tension-type headache, unspecified, intractable: Secondary | ICD-10-CM

## 2016-06-21 DIAGNOSIS — G441 Vascular headache, not elsewhere classified: Secondary | ICD-10-CM | POA: Diagnosis not present

## 2016-06-21 MED ORDER — IMIPRAMINE HCL 10 MG PO TABS: 10.0000 mg | ORAL_TABLET | Freq: Two times a day (BID) | ORAL | 3 refills | Status: DC

## 2016-06-21 NOTE — Telephone Encounter (Signed)
Patient Name: [name] DOB: 1946/06/11 Initial Comment Caller has headaches and they sometimes get severe on the right side. Nurse Assessment Nurse: Vallery Sa, RN, Cathy Date/Time (Eastern Time): 06/21/2016 11:01:02 AM Confirm and document reason for call. If symptomatic, describe symptoms. You must click the next button to save text entered. ---[name] states he has been having problems with headaches and he developed another one last night (rated as a 2 on the 1 to 10 scale). No injury in the past 3 days. No fever. Alert and responsive. Has the patient traveled out of the country within the last 30 days? ---No Does the patient have any new or worsening symptoms? ---Yes Will a triage be completed? ---Yes Related visit to physician within the last 2 weeks? ---No Does the PT have any chronic conditions? (i.e. diabetes, asthma, etc.) ---Yes List chronic conditions. ---High Cholesterol, Anxiety, Heart Valve replacement Is this a behavioral health or substance abuse call? ---No Guidelines Guideline Title Affirmed Question Affirmed Notes Headache [1] New headache AND [2] age > 67 Final Disposition User See Physician within [place], RN, Cathy Comments Scheduled for 2:30pm appointment today with Dr. Anitra Lauth. Referrals REFERRED TO PCP OFFICE Disagree/Comply: Comply

## 2016-06-21 NOTE — Progress Notes (Signed)
OFFICE VISIT  06/23/2016   CC:  Chief Complaint  Patient presents with  . Headache    off and on x 2-3 months, right side only   HPI:    Patient is a 70 y.o.  [name] who presents for headache that he has been having for about 2-3 mo.  Insidious onset around R ear, dull ache that varies in intensity, spreads over temple and feels like it goes behind R eye.  No tearing or eye drooping. Other than a week or so when it went away, the HA has been present constantly.  When it went away he felt like his sinuses were clearer (he was in [place]).  When he returned the allergy/sinus sx's returned and the HA returned.   No vision abnormality or complaints.  Gets lightheaded and sometimes nauseous but no vomiting.  No photo/phonophobia.  He doesn't monitor his bp at home. Says a blood vessel on R upper temple/R side of forehead is sore to touch. No photo or phonophobia.  Has hx of migraines when younger and he says this doesn't feel the same way. No paresthesias, no focal weakness, no dysphagia,no dysarthria.  Past Medical History:  Diagnosis Date  . Anticoagulants causing adverse effect in therapeutic use   . Anxiety    Paxil seemed to cause odd neuro side effects; better on prozac as of 09/2015  . Aortic regurgitation 04/2007 surgery   Resolved with tissue AVR at [facility]  . Cervical spondylosis    ESI by [name] in Ortho  . Chronic lymphocytic leukemia (CLL), B-cell ([place]) approx 2012   stable on f/u's with [name] (most recent 10/2015)  . History of prostate cancer 2003   Rad prost  . History of pulmonary embolism 2011; 10/2014   Recurrence off of anticoagulation 10/2014: per hem/onc ([name]) pt needs lifelong anticoagulation (hypercoag w/u neg).  . Hyperlipidemia    statin-intolerant, except for crestor low dose.  Marland Kitchen Hypertension   . Migraine syndrome   . Mitral regurgitation   . PAF (paroxysmal atrial fibrillation) ([place])   . Panic attacks    "           "             "                   "                  "                       "                             "                         . Pulmonary nodule, right 2015   RLL: resected at [facility] --VATS; Benign RLL nodule (fungal and AFB stains neg.  Plan is for [facility] thoracic surg to do f/u o/v & CT chest 1 yr     Past Surgical History:  Procedure Laterality Date  . AORTIC VALVE REPLACEMENT  2011   [facility]  (bioprosthetic)  . CARDIAC CATHETERIZATION  04/2010   Normal coronaries.  . Carotid dopplers  08/2015   NORMAL  . CATARACT EXTRACTION     L 12/06/09   R 09/14/09  . COLONOSCOPY  01/26/13   tic's, o/w  normal.  ([facility])--recall 10 yrs.  Marland Kitchen PENILE PROSTHESIS IMPLANT    . PROSTATECTOMY  04/2002   for prostate cancer  . Pulmonary nodule resection  Fall/winter 2016   [facility]  . TRANSTHORACIC ECHOCARDIOGRAM  09/20/2014; 03/2016   mild LVH, EF 50-55%, wall motion nl, grade I diast dysfxn, prosth aort valve good,transaortic gradients decreased compared to echo 11/2013.   03/2016: EF 55-60%, normal LV function, severe LVH, normal diast fxn, mild increase in AV gradient compared to 09/2014 echo.    Outpatient Medications Prior to Visit  Medication Sig Dispense Refill  . FLUoxetine (PROZAC) 10 MG capsule Take 1 capsule (10 mg total) by mouth daily. 90 capsule 3  . LORazepam (ATIVAN) 0.5 MG tablet Take 1 tablet (0.5 mg total) by mouth daily as needed for anxiety. 30 tablet 5  . metoprolol succinate (TOPROL-XL) 25 MG 24 hr tablet TAKE 1 TABLET BY MOUTH EVERY DAY 30 tablet 10  . rivaroxaban (XARELTO) [name] MG TABS tablet Take 1 tablet ([name] mg total) by mouth daily with supper. 30 tablet 6  . rosuvastatin (CRESTOR) 10 MG tablet Take 1 tablet by mouth every Monday, Wednesday, and Friday. 45 tablet 3  . imipramine (TOFRANIL) 10 MG tablet Take 1 tablet (10 mg total) by mouth at bedtime. 90 tablet 3   No facility-administered medications prior to visit.     Allergies  Allergen Reactions  . Hydrocodone-Acetaminophen Shortness Of Breath  . Oxycodone Hcl  Shortness Of Breath  . Telmisartan-Hctz Shortness Of Breath and Palpitations    Dizziness, too  . Ramipril   . Aspirin Other (See Comments) and Rash    Other Reaction: confusion Can't take high dose  . Atorvastatin Other (See Comments)    myalgia  . Buspirone Hcl Other (See Comments)    headache  . Codeine Nausea Only and Other (See Comments)    Other Reaction: confusion  . Codeine Phosphate Itching and Nausea Only  . Hydrocodone-Acetaminophen Nausea Only and Other (See Comments)    Other Reaction: confusion  . Morphine Sulfate Itching  . Paroxetine Other (See Comments)    Paresthesias: R arm, R leg, r side of face  . Rabeprazole Other (See Comments)    headache    ROS As per HPI  PE: Blood pressure 120/90, pulse 66, temperature 98.3 F (36.8 C), temperature source Oral, resp. rate 16, height 5\' 10"  (1.778 m), weight 202 lb 4 oz (91.7 kg), SpO2 98 %. Gen: Alert, well appearing.  Patient is oriented to person, place, time, and situation. AFFECT: pleasant, lucid thought and speech. HEAD: atraumatic.  Very mild tenderness over a focal region of upper R temple/R side of forehead. [id number]: no injection, icteris, swelling, or exudate.  EOMI, PERRLA.  No paranasal sinus TTP. Mouth: lips without lesion/swelling.  Oral mucosa pink and moist. Oropharynx without erythema, exudate, or swelling.  CV: RRR, no m/r/g.   LUNGS: CTA bilat, nonlabored resps, good aeration in all lung fields. Neuro: CN 2-12 intact bilaterally, strength 5/5 in proximal and distal upper extremities and lower extremities bilaterally. No tremor. FNF normal bilat.  No ataxia.  Upper extremity and lower extremity DTRs symmetric.  No pronator drift.  LABS:  None today  IMPRESSION AND PLAN:  Chronic daily headache, atypical type of headache. Given location and his mild tenderness of an area near his R temple, I want to check a sed rate, help r/o temporal arteritis. Will also order plain film of sinuses since there  seems to be some relationship between his  sinus congestion/pressure symptoms and his headaches. Will have him increase his imipramine to 10mg  qAM as well as hs.  He is not an NSAID candidate due to being on DOAC. He describes adverse rxn in remote past to taking imitrex so I'll avoid triptans.    Will ask neurology to see for further eval and management.  FOLLOW UP: Return if symptoms worsen or fail to improve.  Signed:  Crissie Sickles, MD           06/23/2016

## 2016-06-21 NOTE — Telephone Encounter (Signed)
Pt called stating that he has been having really bad headaches on the right side of his head that radiate to the back of his head and behind his right eye. He stated that he feels like the veins on the right side of his head are tender.   I spoke with Dr. Anitra Lauth and he advised that pt be transferred to [facility] for Triage.   Pt advised and voiced understanding.  Call was transferred by Diane.

## 2016-06-21 NOTE — Progress Notes (Signed)
Pre visit review using our clinic review tool, if applicable. No additional management support is needed unless otherwise documented below in the visit note. 

## 2016-06-22 LAB — SEDIMENTATION RATE: Sed Rate: 1 mm/hr (ref 0–20)

## 2016-06-27 ENCOUNTER — Ambulatory Visit (HOSPITAL_BASED_OUTPATIENT_CLINIC_OR_DEPARTMENT_OTHER): Payer: Medicare Other | Admitting: Hematology & Oncology

## 2016-06-27 ENCOUNTER — Other Ambulatory Visit (HOSPITAL_BASED_OUTPATIENT_CLINIC_OR_DEPARTMENT_OTHER): Payer: Medicare Other

## 2016-06-27 ENCOUNTER — Ambulatory Visit (HOSPITAL_BASED_OUTPATIENT_CLINIC_OR_DEPARTMENT_OTHER)
Admission: RE | Admit: 2016-06-27 | Discharge: 2016-06-27 | Disposition: A | Discharge status: Home / Self Care | Payer: Medicare Other | Source: Ambulatory Visit | Attending: Family Medicine | Admitting: Family Medicine

## 2016-06-27 ENCOUNTER — Encounter: Payer: Self-pay | Admitting: Hematology & Oncology

## 2016-06-27 VITALS — BP 128/86 | HR 66 | Temp 97.7°F | Resp 18 | Ht 70.0 in | Wt 204.0 lb

## 2016-06-27 DIAGNOSIS — I2699 Other pulmonary embolism without acute cor pulmonale: Secondary | ICD-10-CM

## 2016-06-27 DIAGNOSIS — Z952 Presence of prosthetic heart valve: Secondary | ICD-10-CM | POA: Diagnosis not present

## 2016-06-27 DIAGNOSIS — C9110 Chronic lymphocytic leukemia of B-cell type not having achieved remission: Secondary | ICD-10-CM | POA: Diagnosis not present

## 2016-06-27 DIAGNOSIS — G44201 Tension-type headache, unspecified, intractable: Secondary | ICD-10-CM | POA: Diagnosis not present

## 2016-06-27 DIAGNOSIS — R918 Other nonspecific abnormal finding of lung field: Secondary | ICD-10-CM | POA: Diagnosis not present

## 2016-06-27 LAB — CBC WITH DIFFERENTIAL (CANCER CENTER ONLY)
BASO#: 0.1 10*3/uL (ref 0.0–0.2)
BASO%: 0.6 % (ref 0.0–2.0)
EOS%: 0.6 % (ref 0.0–7.0)
Eosinophils Absolute: 0.1 10*3/uL (ref 0.0–0.5)
HCT: 42.1 % (ref 38.7–49.9)
HGB: 14.4 g/dL (ref 13.0–17.1)
LYMPH#: 12.4 10*3/uL — ABNORMAL HIGH (ref 0.9–3.3)
LYMPH%: 79.3 % — ABNORMAL HIGH (ref 14.0–48.0)
MCH: 32.3 pg (ref 28.0–33.4)
MCHC: 34.2 g/dL (ref 32.0–35.9)
MCV: 94 fL (ref 82–98)
MONO#: 1 10*3/uL — ABNORMAL HIGH (ref 0.1–0.9)
MONO%: 6.1 % (ref 0.0–13.0)
NEUT#: 2.1 10*3/uL (ref 1.5–6.5)
NEUT%: 13.4 % — ABNORMAL LOW (ref 40.0–80.0)
Platelets: 146 10*3/uL (ref 145–400)
RBC: 4.46 10*6/uL (ref 4.20–5.70)
RDW: 13.5 % (ref 11.1–15.7)
WBC: 15.7 10*3/uL — ABNORMAL HIGH (ref 4.0–10.0)

## 2016-06-27 LAB — COMPREHENSIVE METABOLIC PANEL
ALT: 24 U/L (ref 0–55)
AST: 18 U/L (ref 5–34)
Albumin: 4.1 g/dL (ref 3.5–5.0)
Alkaline Phosphatase: 62 U/L (ref 40–150)
Anion Gap: 8 mEq/L (ref 3–11)
BUN: 20.9 mg/dL (ref 7.0–26.0)
CO2: 28 mEq/L (ref 22–29)
Calcium: 9.6 mg/dL (ref 8.4–10.4)
Chloride: 104 mEq/L (ref 98–109)
Creatinine: 1.6 mg/dL — ABNORMAL HIGH (ref 0.7–1.3)
EGFR: 50 mL/min/{1.73_m2} — ABNORMAL LOW (ref >90)
Glucose: 93 mg/dl (ref 70–140)
Potassium: 5.1 mEq/L (ref 3.5–5.1)
Sodium: 139 mEq/L (ref 136–145)
Total Bilirubin: 0.46 mg/dL (ref 0.20–1.20)
Total Protein: 7.1 g/dL (ref 6.4–8.3)

## 2016-06-27 LAB — TECHNOLOGIST REVIEW CHCC SATELLITE

## 2016-06-27 LAB — CHCC SATELLITE - SMEAR

## 2016-06-27 NOTE — Progress Notes (Signed)
Hematology and Oncology Follow Up Visit  [name] [facility] [id number] June 30, 1946 70 y.o. 06/27/2016   Principle Diagnosis:   Right lower lobe pulmonary nodule- non-malignant  Recurrent pulmonary embolism  CLL-stable  Current Therapy:    Xarelto 20 mg by mouth daily     Interim History:  Mr.  Bell is back for follow-up. He is doing okay. He saw cardiology recently. They apparently this lab work on him. They found his white cell count was a little bit elevated. They wish that he come back to see [name].  He has CLL. He has as documented by flow cytometry. The flows otalgias done back in 2012.  He still is asymptomatic with this. He has no palpable lymph nodes. He's had no fever. He's had no rashes. He's had no bleeding or bruising. He is on Xarelto.   Overall, his performance status is ECOG 0. Medications:  Current Outpatient Prescriptions:  .  FLUoxetine (PROZAC) 10 MG capsule, Take 1 capsule (10 mg total) by mouth daily., Disp: 90 capsule, Rfl: 3 .  imipramine (TOFRANIL) 10 MG tablet, Take 1 tablet (10 mg total) by mouth 2 (two) times daily., Disp: 90 tablet, Rfl: 3 .  LORazepam (ATIVAN) 0.5 MG tablet, Take 1 tablet (0.5 mg total) by mouth daily as needed for anxiety., Disp: 30 tablet, Rfl: 5 .  metoprolol succinate (TOPROL-XL) 25 MG 24 hr tablet, TAKE 1 TABLET BY MOUTH EVERY DAY, Disp: 30 tablet, Rfl: 10 .  rivaroxaban (XARELTO) 20 MG TABS tablet, Take 1 tablet (20 mg total) by mouth daily with supper., Disp: 30 tablet, Rfl: 6 .  rosuvastatin (CRESTOR) 10 MG tablet, Take 1 tablet by mouth every Monday, Wednesday, and Friday., Disp: 45 tablet, Rfl: 3  Allergies:  Allergies  Allergen Reactions  . Hydrocodone-Acetaminophen Shortness Of Breath  . Oxycodone Hcl Shortness Of Breath  . Telmisartan-Hctz Shortness Of Breath and Palpitations    Dizziness, too  . Ramipril   . Aspirin Other (See Comments) and Rash    Other Reaction: confusion Can't take high dose  . Atorvastatin Other  (See Comments)    myalgia  . Buspirone Hcl Other (See Comments)    headache  . Codeine Nausea Only and Other (See Comments)    Other Reaction: confusion  . Codeine Phosphate Itching and Nausea Only  . Hydrocodone-Acetaminophen Nausea Only and Other (See Comments)    Other Reaction: confusion  . Morphine Sulfate Itching  . Paroxetine Other (See Comments)    Paresthesias: R arm, R leg, r side of face  . Rabeprazole Other (See Comments)    headache    Past Medical History, Surgical history, Social history, and Family History were reviewed and updated.  Review of Systems: As above  Physical Exam:  height is 5\' 10"  (1.778 m) and weight is 204 lb (92.5 kg). His oral temperature is 97.7 F (36.5 C). His blood pressure is 128/86 and his pulse is 66. His respiration is 18.   Well-developed and well-nourished African-American gentleman. There are no ocular or oral lesions. He has no palpable cervical or supraclavicular lymph nodes. Lungs are clear. Has a rales, wheezes or rhonchi. Cardiac exam regular rate and rhythm with no murmurs, rubs or bruits. Axillary exam that show a right axillary lymph node that measures about 1.5 cm. This is mobile and nontender. There is no left axillary adenopathy. Abdomen is soft. Has good bowel sounds. There is no fluid wave. There is no palpable liver or spleen tip. Back exam shows a well-healed  thoracoscopy scar in the right lateral chest wall. Where he had the chest tube in place just inferior to the thoracoscopy scar is also healing nicely. There is no erythema or warmth over the thoracoscopy site. no tenderness over the spine, ribs or hips. Extremities shows no clubbing, cyanosis or edema. No venous cord is palpable is legs. He has a negative Homans sign. Skin exam shows no rashes, ecchymoses or petechia. Neurological exam is nonfocal.  Lab Results  Component Value Date   WBC 15.7 (H) 06/27/2016   HGB 14.4 06/27/2016   HCT 42.1 06/27/2016   MCV 94 06/27/2016     PLT 146 06/27/2016     Chemistry      Component Value Date/Time   NA 142 11/22/2015 0852   NA 143 11/08/2015 0938   K 5.2 (H) 11/22/2015 0852   K 5.4 (H) 11/08/2015 0938   CL 105 11/22/2015 0852   CL 107 02/06/2015 1421   CO2 32 11/22/2015 0852   CO2 27 11/08/2015 0938   BUN 22 11/22/2015 0852   BUN 21.1 11/08/2015 0938   CREATININE 1.19 11/22/2015 0852   CREATININE 1.4 (H) 11/08/2015 0938      Component Value Date/Time   CALCIUM 9.3 11/22/2015 0852   CALCIUM 9.6 11/08/2015 0938   ALKPHOS 64 11/08/2015 0938   AST 20 11/08/2015 0938   ALT 31 11/08/2015 0938   BILITOT 0.35 11/08/2015 0938         Impression and Plan: [name] is 70 year-old African-American gentleman. He has recurrent pulmonary embolism. I think he needs lifelong anticoagulation.  Of note, he does have a tissue prosthetic aortic valve. This was placed about 4 years ago at [facility]. He's had no problems with this.  I think the current issue is the leukocytosis. He has documented CLL. Thankfully, he is totally asymptomatic with this right now. I would not suspect that he should have any issues with this.  A again, there was no problems with malignancy with this lung nodule. I'm just thankful that he had the surgery and he feels much better having the surgery and finding out that there is no cancer.  For now, we will plan to get him back to see [name] in 6 months. We can monitor his blood counts and see if there is anything that needs to be treated.  Volanda Napoleon, MD 8/17/20173:32 PM

## 2016-07-12 DIAGNOSIS — R519 Headache, unspecified: Secondary | ICD-10-CM

## 2016-07-12 HISTORY — DX: Headache, unspecified: R51.9

## 2016-07-17 ENCOUNTER — Ambulatory Visit (INDEPENDENT_AMBULATORY_CARE_PROVIDER_SITE_OTHER): Payer: 59 | Admitting: Psychology

## 2016-07-17 DIAGNOSIS — F4321 Adjustment disorder with depressed mood: Secondary | ICD-10-CM

## 2016-07-24 ENCOUNTER — Ambulatory Visit (INDEPENDENT_AMBULATORY_CARE_PROVIDER_SITE_OTHER): Payer: Medicare Other | Admitting: Neurology

## 2016-07-24 ENCOUNTER — Encounter: Payer: Self-pay | Admitting: Neurology

## 2016-07-24 VITALS — BP 126/70 | HR 72 | Ht 70.0 in | Wt 203.0 lb

## 2016-07-24 DIAGNOSIS — G4485 Primary stabbing headache: Secondary | ICD-10-CM

## 2016-07-24 MED ORDER — GABAPENTIN 100 MG PO CAPS: 300.0000 mg | ORAL_CAPSULE | Freq: Two times a day (BID) | ORAL | 3 refills | Status: DC

## 2016-07-24 NOTE — Progress Notes (Addendum)
NEUROLOGY CONSULTATION NOTE  [name] MRN: [id number] DOB: [birth year]  Referring provider: Dr. Anitra Lauth Primary care provider: Dr. Anitra Lauth  Reason for consult:  headache  HISTORY OF PRESENT ILLNESS: [name] is a 70 year old right-handed male with hypertension, PAF, hyperlipidemia, anxiety, and history of PE and CLL who presents for headaches.  History obtained by patient and PCP note.    He began having headaches about 2 or 3 month ago.  They are right sided.  He has a severe stabbing pain in the right ear or behind the right eye.  He also has a dull 2-3/10 non-throbbing right-sided headache.  Sometimes, there may be nausea, photophobia or phonophobia.  They last all day and occur about 2 or 3 days per week.  He also notes some blurred vision with or without headache.  He initially had an eye exam, which was normal.  He denies neck pain.  He reportedly had a CT of the head at the [place], which was unremarkable.  He reports history of shrapnel in his head and in his left leg, so he cannot get an MRI.  However, he said that no shrapnel was seen on head CT.  He denies any preceding trigger or change in medication.  He has remote history of migraines, different than these.  Past NSAIDS:  diclofenac Past analgesics:  opioid Past antihypertensive medications:  no Past antidepressant medications:  citalopram, paroxetine, venlafaxine  Current NSAIDS:  Contraindicated due to anticoagulation Current analgesics:  no Current triptans:  no Current anti-emetic:  no Current muscle relaxants:  Tried one that helped Current anti-anxiolytic:  lorazepam Current sleep aide:  no Current Antihypertensive medications:  Toprol-XL 25mg  Current Antidepressant medications:  imipramine 10mg , fluoxetine 10mg  Current Anticonvulsant medications:  no  Labs from August:  Sed Rate 1; CBC with WBC 15.7, HGB 14.4, HCT 42.1 and PLT 146   PAST MEDICAL HISTORY: Past Medical History:  Diagnosis Date  .  Anticoagulants causing adverse effect in therapeutic use   . Anxiety    Paxil seemed to cause odd neuro side effects; better on prozac as of 09/2015  . Aortic regurgitation 04/2007 surgery   Resolved [facility]R [facility]  . Cervical spondylosis    ESI by [name] in Ortho  . Chronic lymphocytic leukemia (CLL), B-cell ([place]) approx 2012   stable on f/u's with [name] (most recent 06/2016)  . History of prostate cancer 2003   Rad prost  . History of pulmonary embolism 2011; 10/2014   Recurrence off of anticoagulation 10/2014: per hem/onc ([name]) pt needs lifelong anticoagulation (hypercoag w/u neg).  . Hyperlipidemia    statin-intolerant, except for crestor low dose.  Marland Kitchen Hypertension   . Migraine syndrome   . Mitral regurgitation   . PAF (paroxysmal atrial fibrillation) ([place])   . Panic attacks    "           "             "                  "                  "                       "                             "                         .  Pulmonary nodule, right 2015   RLL: resected at [facility] --VATS; Benign RLL nodule (fungal and AFB stains neg.  Plan is for [facility] thoracic surg to do f/u o/v & CT chest 1 yr     PAST SURGICAL HISTORY: Past Surgical History:  Procedure Laterality Date  . AORTIC VALVE REPLACEMENT  2011   [facility]  (bioprosthetic)  . CARDIAC CATHETERIZATION  04/2010   Normal coronaries.  . Carotid dopplers  08/2015   NORMAL  . CATARACT EXTRACTION     L 12/06/09   R 09/14/09  . COLONOSCOPY  01/26/13   tic's, o/w normal.  ([place])--recall 10 yrs.  Marland Kitchen PENILE PROSTHESIS IMPLANT    . PROSTATECTOMY  04/2002   for prostate cancer  . Pulmonary nodule resection  Fall/winter 2016   [facility]  . TRANSTHORACIC ECHOCARDIOGRAM  09/20/2014; 03/2016   mild LVH, EF 50-55%, wall motion nl, grade I diast dysfxn, prosth aort valve good,transaortic gradients decreased compared to echo 11/2013.   03/2016: EF 55-60%, normal LV function, severe LVH, normal diast fxn, mild increase in AV gradient  compared to 09/2014 echo.    MEDICATIONS: Current Outpatient Prescriptions on File Prior to Visit  Medication Sig Dispense Refill  . FLUoxetine (PROZAC) 10 MG capsule Take 1 capsule (10 mg total) by mouth daily. 90 capsule 3  . imipramine (TOFRANIL) 10 MG tablet Take 1 tablet (10 mg total) by mouth 2 (two) times daily. 90 tablet 3  . LORazepam (ATIVAN) 0.5 MG tablet Take 1 tablet (0.5 mg total) by mouth daily as needed for anxiety. 30 tablet 5  . metoprolol succinate (TOPROL-XL) 25 MG 24 hr tablet TAKE 1 TABLET BY MOUTH EVERY DAY 30 tablet 10  . rivaroxaban (XARELTO) 20 MG TABS tablet Take 1 tablet (20 mg total) by mouth daily with supper. 30 tablet 6  . rosuvastatin (CRESTOR) 10 MG tablet Take 1 tablet by mouth every Monday, Wednesday, and Friday. 45 tablet 3   No current facility-administered medications on file prior to visit.     ALLERGIES: Allergies  Allergen Reactions  . Hydrocodone-Acetaminophen Shortness Of Breath  . Oxycodone Hcl Shortness Of Breath  . Telmisartan-Hctz Shortness Of Breath and Palpitations    Dizziness, too  . Ramipril   . Aspirin Other (See Comments) and Rash    Other Reaction: confusion Can't take high dose  . Atorvastatin Other (See Comments)    myalgia  . Buspirone Hcl Other (See Comments)    headache  . Codeine Nausea Only and Other (See Comments)    Other Reaction: confusion  . Codeine Phosphate Itching and Nausea Only  . Hydrocodone-Acetaminophen Nausea Only and Other (See Comments)    Other Reaction: confusion  . Morphine Sulfate Itching  . Paroxetine Other (See Comments)    Paresthesias: R arm, R leg, r side of face  . Rabeprazole Other (See Comments)    headache    FAMILY HISTORY: Family History  Problem Relation Age of Onset  . Cancer Sister     uterine  . Colon cancer Sister 32  . Stroke Mother   . Hyperlipidemia Other   . Hypertension Other   . Cancer      prostate  . Stroke Sister   . Stroke Brother   . Heart attack Neg Hx      SOCIAL HISTORY: Social History   Social History  . Marital status: Single    Spouse name: N/A  . Number of children: N/A  . Years of education: N/A   Occupational History  .  Not on file.   Social History Main Topics  . Smoking status: Former Smoker    Packs/day: 1.00    Years: 12.00    Types: Cigarettes    Start date: 11/28/1968    Quit date: 01/29/1981  . Smokeless tobacco: Never Used     Comment: quit 33 years ago  . Alcohol use 0.0 oz/week     Comment: occasional  . Drug use: No  . Sexual activity: Not on file   Other Topics Concern  . Not on file   Social History Narrative  . No narrative on file    REVIEW OF SYSTEMS: Constitutional: No fevers, chills, or sweats, no generalized fatigue, change in appetite Eyes: No visual changes, double vision, eye pain Ear, nose and throat: No hearing loss, ear pain, nasal congestion, sore throat Cardiovascular: No chest pain, palpitations Respiratory:  No shortness of breath at rest or with exertion, wheezes GastrointestinaI: No nausea, vomiting, diarrhea, abdominal pain, fecal incontinence Genitourinary:  No dysuria, urinary retention or frequency Musculoskeletal:  No neck pain, back pain Integumentary: No rash, pruritus, skin lesions Neurological: as above Psychiatric: No depression, insomnia, anxiety Endocrine: No palpitations, fatigue, diaphoresis, mood swings, change in appetite, change in weight, increased thirst Hematologic/Lymphatic:  No purpura, petechiae. Allergic/Immunologic: no itchy/runny eyes, nasal congestion, recent allergic reactions, rashes  PHYSICAL EXAM: Vitals:   07/24/16 1357  BP: 126/70  Pulse: 72   General: No acute distress.  Patient appears well-groomed.  Head:  Normocephalic/atraumatic Eyes:  fundi examined but not visualized Neck: supple, no paraspinal tenderness, full range of motion Back: No paraspinal tenderness Heart: regular rate and rhythm Lungs: Clear to auscultation  bilaterally. Vascular: No carotid bruits. Neurological Exam: Mental status: alert and oriented to person, place, and time, recent and remote memory intact, fund of knowledge intact, attention and concentration intact, speech fluent and not dysarthric, language intact. Cranial nerves: CN I: not tested CN II: pupils equal, round and reactive to light, visual fields intact CN III, IV, VI:  full range of motion, no nystagmus, no ptosis CN V: facial sensation intact CN VII: upper and lower face symmetric CN VIII: hearing intact CN IX, X: gag intact, uvula midline CN XI: sternocleidomastoid and trapezius muscles intact CN XII: tongue midline Bulk & Tone: normal, no fasciculations. Motor:  5/5 throughout  Sensation:  Pinprick and vibration sensation intact. Deep Tendon Reflexes:  2+ throughout, toes downgoing.  Finger to nose testing:  Without dysmetria.  Heel to shin:  Without dysmetria.  Gait:  Normal station and stride.  Able to turn and tandem walk. Romberg negative.  IMPRESSION: Right sided headache, primary stabbing headache or migraine  PLAN: 1.  Will start gabapentin 300mg  at bedtime and if tolerating, we can increase to twice daily dosing. 2.  We will get report of brain imaging from [place].  ADDENDUM:  SKULL, COMPLETE (03/07/16): apparent 2 small radiopaque foreign bodies in the soft tissues inferior and lateral to the right skull.  CT HEAD W/O CONTRAST (03/15/16):  No acute intracranial abnormality or evidence of sinusitis within visualized portions of the paranasal sinus; mild chronic microvascular ischemic change. 3.  Follow up in 3 months.  Thank you for allowing me to take part in the care of this patient.  Metta Clines, DO  CC:  Shawnie Dapper, MD

## 2016-07-24 NOTE — Patient Instructions (Signed)
1.  To treat the headache, we will start gabapentin 100mg  capsules.  Take 3 capsules twice daily and contact me in 4 weeks with update.  We can increase dose if needed. 2.  We will check a sed rate, which is a nonspecific inflammatory marker to see if there is any inflammation in the artery in your temple that may be causing the headache. 3.  Follow up in approximately 3 months but remember to contact me in 4 weeks.

## 2016-08-02 ENCOUNTER — Telehealth: Payer: Self-pay | Admitting: Neurology

## 2016-08-02 NOTE — Telephone Encounter (Signed)
Returned pt's call. No answer. VM left to call back.

## 2016-08-02 NOTE — Telephone Encounter (Signed)
Spoke with patient. He was curious if he may take only 1 gabapentin instead of 2 daily as prescribed as he was having some side effects (pt stated felt "drunk" the next day) taking 2. Advised pt to start with one tablet at night first, and to call back next week if he also had side effects with just 1 tablet.

## 2016-08-02 NOTE — Telephone Encounter (Signed)
[name] [birth year]. He was calling with a questions about his Medication. He did not leave on the machine what medication. His # is [id number]. Thank you

## 2016-08-02 NOTE — Telephone Encounter (Signed)
VM-PT left a message he has a question about his medication/[name] CB# [phone]

## 2016-08-08 ENCOUNTER — Other Ambulatory Visit: Payer: Self-pay

## 2016-08-08 MED ORDER — LORAZEPAM 0.5 MG PO TABS: 0.5000 mg | ORAL_TABLET | Freq: Every day | ORAL | 5 refills | Status: DC | PRN

## 2016-08-08 NOTE — Telephone Encounter (Signed)
Pharmacy sent refill request for Lorazepam.

## 2016-08-13 ENCOUNTER — Encounter: Payer: Self-pay | Admitting: Neurology

## 2016-08-15 ENCOUNTER — Ambulatory Visit (INDEPENDENT_AMBULATORY_CARE_PROVIDER_SITE_OTHER): Payer: 59 | Admitting: Psychology

## 2016-08-15 DIAGNOSIS — F4321 Adjustment disorder with depressed mood: Secondary | ICD-10-CM

## 2016-08-26 ENCOUNTER — Other Ambulatory Visit: Payer: Self-pay | Admitting: Family

## 2016-08-29 ENCOUNTER — Encounter: Payer: Self-pay | Admitting: Family Medicine

## 2016-08-29 ENCOUNTER — Ambulatory Visit: Payer: Medicare Other | Admitting: Family Medicine

## 2016-08-29 ENCOUNTER — Ambulatory Visit (INDEPENDENT_AMBULATORY_CARE_PROVIDER_SITE_OTHER): Payer: Medicare Other | Admitting: Family Medicine

## 2016-08-29 VITALS — BP 130/86 | HR 78 | Temp 98.0°F | Ht 70.0 in | Wt 204.6 lb

## 2016-08-29 DIAGNOSIS — I480 Paroxysmal atrial fibrillation: Secondary | ICD-10-CM | POA: Diagnosis not present

## 2016-08-29 DIAGNOSIS — M47812 Spondylosis without myelopathy or radiculopathy, cervical region: Secondary | ICD-10-CM | POA: Insufficient documentation

## 2016-08-29 DIAGNOSIS — I2699 Other pulmonary embolism without acute cor pulmonale: Secondary | ICD-10-CM

## 2016-08-29 DIAGNOSIS — Z87891 Personal history of nicotine dependence: Secondary | ICD-10-CM | POA: Insufficient documentation

## 2016-08-29 DIAGNOSIS — Z23 Encounter for immunization: Secondary | ICD-10-CM

## 2016-08-29 DIAGNOSIS — G43909 Migraine, unspecified, not intractable, without status migrainosus: Secondary | ICD-10-CM

## 2016-08-29 DIAGNOSIS — F411 Generalized anxiety disorder: Secondary | ICD-10-CM

## 2016-08-29 DIAGNOSIS — Z8546 Personal history of malignant neoplasm of prostate: Secondary | ICD-10-CM | POA: Diagnosis not present

## 2016-08-29 DIAGNOSIS — I10 Essential (primary) hypertension: Secondary | ICD-10-CM | POA: Diagnosis not present

## 2016-08-29 NOTE — Progress Notes (Deleted)
Pre visit review using our clinic review tool, if applicable. No additional management support is needed unless otherwise documented below in the visit note. 

## 2016-08-29 NOTE — Assessment & Plan Note (Signed)
S: History of migraines but states he also Has seen neurology for right sided headaches (states not a migraine). Gets double vision with headaches. A/P: He tells me he did not discuss double vision with neurology. I considered MR brain but patient has shrapnel in his body from war. We discussed that I would strongly encourage

## 2016-08-29 NOTE — Assessment & Plan Note (Signed)
Thinks he had AAA and kidney screen at the [place]- will request last imaging and office note

## 2016-08-29 NOTE — Patient Instructions (Addendum)
Return to Dr. Tomi Likens to discuss double vision  We will call you within a week about your referral to urology. If you do not hear within 2 weeks, give [name] a call.   Sign release of information at the check out desk for most recent imaging of the kidneys and aorta as well as last office note and last set of labs.   I would also like for you to sign up for an annual wellness visit with our nurse, Manuela Schwartz, who specializes in the annual wellness exam. Do this in 6 months then see me for a physical in a year. This is a free benefit under medicare that may help [name] find additional ways to help you. Some highlights are reviewing medications, lifestyle, and doing a dementia screen.

## 2016-08-29 NOTE — Progress Notes (Signed)
Pre visit review using our clinic review tool, if applicable. No additional management support is needed unless otherwise documented below in the visit note. 

## 2016-08-29 NOTE — Assessment & Plan Note (Signed)
S: On lifelong therapy after 2 PE (2011 and 2015). DVT noted resolution 2012. Xarelto 20mg  daily compliant A/P: continue current lifelong therapy of xarelto

## 2016-08-29 NOTE — Assessment & Plan Note (Signed)
S: controlled on Metoprolol 25 mg XL BP Readings from Last 3 Encounters:  08/29/16 130/86  07/24/16 126/70  06/27/16 128/86  A/P:Continue current medications

## 2016-08-29 NOTE — Assessment & Plan Note (Signed)
S: Unable to tolerate statins in past. Right now doing MWF crestor 10mg  Lab Results  Component Value Date   CHOL 202 (H) 06/13/2016   HDL 56.60 06/13/2016   LDLCALC 126 (H) 06/13/2016   LDLDIRECT 169.0 03/30/2013   TRIG 97.0 06/13/2016   CHOLHDL 4 06/13/2016   A/P: mild poor control but improved from previous- most tolerable dose so will continue

## 2016-08-29 NOTE — Assessment & Plan Note (Signed)
S: PAF. Diagnosed around 2010. Xarelto 20mg . Metoprolol 25mg  XL- no a fib since AVR A/P: continue metoprolol for rate control if goes into a fib but has not in several years. Also xarelto for anticoagulation

## 2016-08-29 NOTE — Assessment & Plan Note (Signed)
Radical prostatectomy 2008- has not followwed with urology recently. History penile implant- he needs follow up so referred today

## 2016-08-29 NOTE — Progress Notes (Signed)
Subjective:  [name] is a 70 y.o. year old very pleasant male patient who presents for/with See problem oriented charting ROS- still active with tai chi- No chest pain or shortness of breath. has headache and double vision at times- will return to neurology. .see any ROS included in HPI as well.   Past Medical History-  Patient Active Problem List   Diagnosis Date Noted  . S/P Bioprosthetic AVR (aortic valve replacement) in 2011 03/22/2015    Priority: High  . Recurrent pulmonary embolism ([place]) 11/09/2014    Priority: High  . Chronic lymphocytic leukemia ([place]) 11/11/2013    Priority: High  . Atrial fibrillation ([place]) 04/20/2007    Priority: High  . PROSTATE CANCER, HX OF 04/20/2007    Priority: High  . Cervical spondylosis     Priority: Medium  . Migraine 10/08/2010    Priority: Medium  . Insomnia 02/03/2008    Priority: Medium  . Anxiety state 09/06/2007    Priority: Medium  . HLD (hyperlipidemia) 04/20/2007    Priority: Medium  . Essential hypertension 04/20/2007    Priority: Medium  . Lung nodule 11/02/2014    Priority: Low  . GERD 12/11/2009    Priority: Low  . MITRAL REGURGITATION 04/07/2009    Priority: Low  . Former smoker 08/29/2016    Medications- reviewed and updated Current Outpatient Prescriptions  Medication Sig Dispense Refill  . diclofenac sodium (VOLTAREN) 1 % GEL Apply 2 g topically as needed.    Marland Kitchen FLUoxetine (PROZAC) 10 MG capsule Take 1 capsule (10 mg total) by mouth daily. 90 capsule 3  . imipramine (TOFRANIL) 10 MG tablet Take 1 tablet (10 mg total) by mouth 2 (two) times daily. 90 tablet 3  . LORazepam (ATIVAN) 0.5 MG tablet Take 1 tablet (0.5 mg total) by mouth daily as needed for anxiety. 30 tablet 5  . metoprolol succinate (TOPROL-XL) 25 MG 24 hr tablet TAKE 1 TABLET BY MOUTH EVERY DAY 30 tablet 10  . rivaroxaban (XARELTO) 20 MG TABS tablet Take 1 tablet (20 mg total) by mouth daily with supper. 30 tablet 6  . rosuvastatin (CRESTOR) 10 MG  tablet Take 1 tablet by mouth every Monday, Wednesday, and Friday. 45 tablet 3   No current facility-administered medications for this visit.     Objective: BP 130/86 (BP Location: Left Arm, Patient Position: Sitting, Cuff Size: Large)   Pulse 78   Temp 98 F (36.7 C) (Oral)   Ht _0  (1.778 m)   Wt 204 lb 9.6 oz (92.8 kg)   SpO2 95%   BMI 29.36 kg/m  Gen: NAD, resting comfortably CV: RRR no murmurs rubs or gallops Lungs: CTAB no crackles, wheeze, rhonchi Abdomen: soft/nontender/nondistended/normal bowel sounds. overweight Ext: no edema Skin: warm, dry  Assessment/Plan:  Recurrent pulmonary embolism ([place]) S: On lifelong therapy after 2 PE (2011 and 2015). DVT noted resolution 2012. Xarelto 75m daily compliant A/P: continue current lifelong therapy of xarelto   Migraine S: History of migraines but states he also Has seen neurology for right sided headaches (states not a migraine). Gets double vision with headaches. A/P: He tells me he did not discuss double vision with neurology. I considered MR brain but patient has shrapnel in his body from war. We discussed that I would strongly encourage    HLD (hyperlipidemia) S: Unable to tolerate statins in past. Right now doing MWF crestor 161mLab Results  Component Value Date   CHOL 202 (H) 06/13/2016   HDL 56.60 06/13/2016  LDLCALC 126 (H) 06/13/2016   LDLDIRECT 169.0 03/30/2013   TRIG 97.0 06/13/2016   CHOLHDL 4 06/13/2016   A/P: mild poor control but improved from previous- most tolerable dose so will continue   Essential hypertension S: controlled on Metoprolol 25 mg XL BP Readings from Last 3 Encounters:  08/29/16 130/86  07/24/16 126/70  06/27/16 128/86  A/P:Continue current medications   PROSTATE CANCER, HX OF Radical prostatectomy 2008- has not followwed with urology recently. History penile implant- he needs follow up so referred today  Former smoker Thinks he had AAA and kidney screen at the [place]- will  request last imaging and office note  Wants to do only once yearly visits. Agreed to this if awv in 6 months with Wynetta Fines  Orders Placed This Encounter  Procedures  . Flu vaccine HIGH DOSE PF  . Ambulatory referral to Urology    Referral Priority:   Routine    Referral Type:   Consultation    Referral Reason:   Specialty Services Required    Requested Specialty:   Urology    Number of Visits Requested:   1   Meds ordered this encounter  Medications  . diclofenac sodium (VOLTAREN) 1 % GEL    Sig: Apply 2 g topically as needed.   Return precautions advised.  Garret Reddish, MD

## 2016-08-30 ENCOUNTER — Telehealth: Payer: Self-pay

## 2016-08-30 MED ORDER — GABAPENTIN 100 MG PO CAPS: ORAL_CAPSULE | ORAL | 1 refills | Status: DC

## 2016-08-30 NOTE — Telephone Encounter (Signed)
Detailed message left on machine for pt . RX sent to pharmacy.

## 2016-08-30 NOTE — Telephone Encounter (Signed)
Is the blurred vision constant or just with the headache.  Is the blurred vision with headache new or old (he never told me about visual changes with the headache)?  If it is new, we need a CT of head.  If it has always been a symptom of the headache and the headaches are just worse, then we should increase the gabapentin.

## 2016-08-30 NOTE — Telephone Encounter (Signed)
Spoke with patient. He is not taking gabapentin at all. He stated that it makes him very drowsy and he feels immobile on it.

## 2016-08-30 NOTE — Telephone Encounter (Signed)
Spoke with patient. Did get confirmation that blurred vision is just during headaches. It is a newer symptom of headaches, or at least has become more frequent (pt noted similar experiences with headache on a few rare occassions, but never as frequent as they are currently). Pt stated if both eyes are open that it is like a "picture over a picture", if pt closes 1 eye, he is fine. Pt also stated that he noticed that headache/blurred vision is a side effect of Prozac, pt has been on this medication since at least January. Please advise.

## 2016-08-30 NOTE — Telephone Encounter (Signed)
If there is a correlation with Prozac, he should probably stop the Prozac (should discuss with his PCP first).  CT of the head was already performed in May, which was negative for anything worrisome.  We can increase gabapentin.  Is he taking 300mg  once daily or twice daily?  If he is taking it once daily, he can increase dose to twice daily

## 2016-08-30 NOTE — Telephone Encounter (Signed)
Then we can start gabapentin at a lower dose (100mg  at bedtime for 7 days, then 200mg  at bedtime for 7 days, then 300mg  at bedtime).  Depending on how he is doing on that dose, we can increase dose from there.

## 2016-08-30 NOTE — Telephone Encounter (Signed)
Pt called with complaints of blurred vision during headaches. Stated he saw his PCP yesterday, who encouraged him to f/u with neurologist. Pt wanted to know if okay to wait until his follow up in January (11/26/16) to be evaluated or if he needs to been seen sooner?   From yesterday's OV with PCP, Dr. Garret Reddish noted,   "Migraine S: History of migraines but states he also Has seen neurology for right sided headaches (states not a migraine). Gets double vision with headaches. A/P: He tells me he did not discuss double vision with neurology. I considered MR brain but patient has shrapnel in his body from war. We discussed that I would strongly encourage "  Please advise.   [id number]

## 2016-09-03 ENCOUNTER — Other Ambulatory Visit: Payer: Self-pay | Admitting: Family Medicine

## 2016-09-03 NOTE — Telephone Encounter (Signed)
Pt no longer [facility] patient.  Forwarding to Dr Yong Channel, pt's PCP.

## 2016-09-04 ENCOUNTER — Telehealth: Payer: Self-pay

## 2016-09-04 MED ORDER — TOPIRAMATE 25 MG PO TABS: 25.0000 mg | ORAL_TABLET | Freq: Every day | ORAL | 1 refills | Status: DC

## 2016-09-04 NOTE — Telephone Encounter (Signed)
He can start topiramate 25mg  at bedtime.  I want to give this dose 4 weeks and then he can let us know and we can increase dose if needed.    Possible side effects include: impaired thinking, sedation, paresthesias (numbness and tingling) and weight loss.  It may cause dehydration and there is a small risk for kidney stones, so make sure to stay hydrated with water during the day.  There is also a very small risk for glaucoma, so if you notice any change in your vision while taking this medication, see an ophthalmologist.

## 2016-09-04 NOTE — Telephone Encounter (Signed)
Message relayed to patient. Verbalized understanding and denied questions. RX sent in.

## 2016-09-04 NOTE — Telephone Encounter (Signed)
Pt called. He took 1 - 100 mg tablet QHS on Saturday and Sunday. PT stated that on Sunday he had difficulty falling asleep and then when he finally did he slept til about 4 p.m. The next day. Pt stated he will not be able to take the gabapentin. Please advise.

## 2016-09-09 ENCOUNTER — Ambulatory Visit: Payer: Self-pay | Admitting: Family Medicine

## 2016-09-12 ENCOUNTER — Ambulatory Visit (INDEPENDENT_AMBULATORY_CARE_PROVIDER_SITE_OTHER): Payer: 59 | Admitting: Psychology

## 2016-09-12 DIAGNOSIS — F4321 Adjustment disorder with depressed mood: Secondary | ICD-10-CM

## 2016-10-01 ENCOUNTER — Other Ambulatory Visit: Payer: Self-pay | Admitting: *Deleted

## 2016-10-01 MED ORDER — RIVAROXABAN 20 MG PO TABS: 20.0000 mg | ORAL_TABLET | Freq: Every day | ORAL | 1 refills | Status: DC

## 2016-10-01 NOTE — Telephone Encounter (Signed)
Requesting 90 day supply.  RF request for xarelto LOV: 05/28/16 Next ov: None Last written: 09/03/16 #30 w/ 6RF

## 2016-10-07 ENCOUNTER — Telehealth: Payer: Self-pay | Admitting: Neurology

## 2016-10-07 NOTE — Telephone Encounter (Signed)
PT called and wanted to speak to Dr Georgie Chard nurse/[name] CB# [phone]

## 2016-10-07 NOTE — Telephone Encounter (Signed)
Pt called with complaints of side effects from Topamax. Was very effective for headache, but pt developed cough. Pt stated it took forever for him to finally associate the cough with the topamax but d/c medication last night (was on 25 mg QHS) and can already tell a difference. PT would like to remain of daily preventative for the time being and see if headache returns or not. FYI.

## 2016-10-10 ENCOUNTER — Ambulatory Visit (INDEPENDENT_AMBULATORY_CARE_PROVIDER_SITE_OTHER): Payer: 59 | Admitting: Psychology

## 2016-10-10 DIAGNOSIS — F4321 Adjustment disorder with depressed mood: Secondary | ICD-10-CM

## 2016-10-28 ENCOUNTER — Other Ambulatory Visit: Payer: Self-pay | Admitting: Neurology

## 2016-11-01 ENCOUNTER — Ambulatory Visit: Payer: Self-pay | Admitting: Neurology

## 2016-11-04 ENCOUNTER — Other Ambulatory Visit: Payer: Self-pay | Admitting: Cardiovascular Disease

## 2016-11-06 ENCOUNTER — Encounter: Payer: Self-pay | Admitting: Family Medicine

## 2016-11-06 ENCOUNTER — Other Ambulatory Visit: Payer: Medicare Other

## 2016-11-06 ENCOUNTER — Ambulatory Visit (HOSPITAL_BASED_OUTPATIENT_CLINIC_OR_DEPARTMENT_OTHER): Payer: Medicare Other | Admitting: Hematology & Oncology

## 2016-11-06 VITALS — BP 146/90 | HR 64 | Temp 97.9°F | Resp 18 | Wt 209.0 lb

## 2016-11-06 DIAGNOSIS — Z952 Presence of prosthetic heart valve: Secondary | ICD-10-CM | POA: Diagnosis not present

## 2016-11-06 DIAGNOSIS — C9110 Chronic lymphocytic leukemia of B-cell type not having achieved remission: Secondary | ICD-10-CM | POA: Diagnosis not present

## 2016-11-06 DIAGNOSIS — Z7901 Long term (current) use of anticoagulants: Secondary | ICD-10-CM

## 2016-11-06 DIAGNOSIS — I2699 Other pulmonary embolism without acute cor pulmonale: Secondary | ICD-10-CM | POA: Diagnosis not present

## 2016-11-06 DIAGNOSIS — I480 Paroxysmal atrial fibrillation: Secondary | ICD-10-CM

## 2016-11-06 NOTE — Progress Notes (Signed)
Hematology and Oncology Follow Up Visit  [name] [facility] [id number] September 18, 1946 70 y.o. 11/06/2016   Principle Diagnosis:   Right lower lobe pulmonary nodule- non-malignant  Recurrent pulmonary embolism  CLL-stable  Current Therapy:    Xarelto 20 mg by mouth daily     Interim History:  [name] is back for follow-up. I think that this is an early visit for him. I was supposed to see him back in 6 months. He did not have any lab work done today.  He's been feeling good. He had a good thanks giving a good Christmas.  He hopefully will be able to get more compensation from the [place] system for his tour of duty in [place]. I filled out some forms for him. I think the CLL probably is from exposure to agent orange or whatever else was used as a defoliant in [place].   He has had no problems with fatigue or weakness. He is still working out. He teaches tai chi.   He's had no rashes. He's had no leg swelling. He's had no cough or shortness of breath. He's had no change in bowel or bladder habits.   Overall, his performance status is ECOG 0.  Medications:  Current Outpatient Prescriptions:  .  diclofenac sodium (VOLTAREN) 1 % GEL, Apply 2 g topically as needed., Disp: , Rfl:  .  FLUoxetine (PROZAC) 10 MG capsule, Take 1 capsule (10 mg total) by mouth daily., Disp: 90 capsule, Rfl: 3 .  imipramine (TOFRANIL) 10 MG tablet, Take 1 tablet (10 mg total) by mouth 2 (two) times daily., Disp: 90 tablet, Rfl: 3 .  LORazepam (ATIVAN) 0.5 MG tablet, Take 1 tablet (0.5 mg total) by mouth daily as needed for anxiety., Disp: 30 tablet, Rfl: 5 .  metoprolol succinate (TOPROL-XL) 25 MG 24 hr tablet, TAKE 1 TABLET BY MOUTH EVERY DAY, Disp: 30 tablet, Rfl: 8 .  rivaroxaban (XARELTO) 20 MG TABS tablet, Take 1 tablet (20 mg total) by mouth daily with supper., Disp: 90 tablet, Rfl: 1 .  rosuvastatin (CRESTOR) 10 MG tablet, Take 1 tablet by mouth every Monday, Wednesday, and Friday., Disp: 45 tablet, Rfl: 3 .   topiramate (TOPAMAX) 25 MG tablet, TAKE 1 TABLET (25 MG TOTAL) BY MOUTH AT BEDTIME., Disp: 30 tablet, Rfl: 1  Allergies:  Allergies  Allergen Reactions  . Hydrocodone-Acetaminophen Shortness Of Breath  . Oxycodone Hcl Shortness Of Breath  . Telmisartan-Hctz Shortness Of Breath and Palpitations    Dizziness, too  . Ramipril   . Aspirin Other (See Comments) and Rash    Other Reaction: confusion Can't take high dose  . Atorvastatin Other (See Comments)    myalgia  . Buspirone Hcl Other (See Comments)    headache  . Codeine Nausea Only and Other (See Comments)    Other Reaction: confusion  . Codeine Phosphate Itching and Nausea Only  . Hydrocodone-Acetaminophen Nausea Only and Other (See Comments)    Other Reaction: confusion  . Morphine Sulfate Itching  . Paroxetine Other (See Comments)    Paresthesias: R arm, R leg, r side of face  . Rabeprazole Other (See Comments)    headache    Past Medical History, Surgical history, Social history, and Family History were reviewed and updated.  Review of Systems: As above  Physical Exam:  weight is 209 lb (94.8 kg). His oral temperature is 97.9 F (36.6 C). His blood pressure is 146/90 (abnormal) and his pulse is 64. His respiration is 18.   Well-developed  and well-nourished African-American gentleman. There are no ocular or oral lesions. He has no palpable cervical or supraclavicular lymph nodes. Lungs are clear. Has a rales, wheezes or rhonchi. Cardiac exam regular rate and rhythm with no murmurs, rubs or bruits. Axillary exam that show a right axillary lymph node that measures about 1.5 cm. This is mobile and nontender. There is no left axillary adenopathy. Abdomen is soft. Has good bowel sounds. There is no fluid wave. There is no palpable liver or spleen tip. Back exam shows a well-healed thoracoscopy scar in the right lateral chest wall. Where he had the chest tube in place just inferior to the thoracoscopy scar is also healing nicely.  There is no erythema or warmth over the thoracoscopy site. no tenderness over the spine, ribs or hips. Extremities shows no clubbing, cyanosis or edema. No venous cord is palpable is legs. He has a negative Homans sign. Skin exam shows no rashes, ecchymoses or petechia. Neurological exam is nonfocal.  Lab Results  Component Value Date   WBC 15.7 (H) 06/27/2016   HGB 14.4 06/27/2016   HCT 42.1 06/27/2016   MCV 94 06/27/2016   PLT 146 06/27/2016     Chemistry      Component Value Date/Time   NA 139 06/27/2016 1410   K 5.1 06/27/2016 1410   CL 105 11/22/2015 0852   CL 107 02/06/2015 1421   CO2 28 06/27/2016 1410   BUN 20.9 06/27/2016 1410   CREATININE 1.6 (H) 06/27/2016 1410      Component Value Date/Time   CALCIUM 9.6 06/27/2016 1410   ALKPHOS 62 06/27/2016 1410   AST 18 06/27/2016 1410   ALT 24 06/27/2016 1410   BILITOT 0.46 06/27/2016 1410         Impression and Plan: [name] is 69 year-old African-American gentleman. He has recurrent pulmonary embolism. I think he needs lifelong anticoagulation.  Of note, he does have a tissue prosthetic aortic valve. This was placed about 4 years ago at [facility]. He's had no problems with this.  I think the current issue is the leukocytosis. He has documented CLL. Thankfully, he is totally asymptomatic with this right now. I would not suspect that he should have any issues with this.  It sounds like next year will be very exciting for him. The big trip will be [name] in October. He will be going to [place]. He'll be there for 10 days. I'm sure that he will really enjoy this and I look 4 to see pictures when he comes back.   I will see him back in 6 months. I will make sure that he hasn't have work done we see him back.   Volanda Napoleon, MD 12/27/20171:38 PM

## 2016-11-07 ENCOUNTER — Ambulatory Visit: Payer: 59 | Admitting: Psychology

## 2016-11-22 ENCOUNTER — Encounter: Payer: Self-pay | Admitting: Family Medicine

## 2016-11-22 ENCOUNTER — Ambulatory Visit (INDEPENDENT_AMBULATORY_CARE_PROVIDER_SITE_OTHER): Payer: Medicare Other | Admitting: Family Medicine

## 2016-11-22 VITALS — BP 132/88 | HR 75 | Temp 98.3°F | Ht 70.0 in | Wt 210.6 lb

## 2016-11-22 DIAGNOSIS — B9689 Other specified bacterial agents as the cause of diseases classified elsewhere: Secondary | ICD-10-CM | POA: Diagnosis not present

## 2016-11-22 DIAGNOSIS — J329 Chronic sinusitis, unspecified: Secondary | ICD-10-CM | POA: Diagnosis not present

## 2016-11-22 MED ORDER — AMOXICILLIN-POT CLAVULANATE 875-125 MG PO TABS: 1.0000 | ORAL_TABLET | Freq: Two times a day (BID) | ORAL | 0 refills | Status: DC

## 2016-11-22 NOTE — Progress Notes (Signed)
Pre visit review using our clinic review tool, if applicable. No additional management support is needed unless otherwise documented below in the visit note. 

## 2016-11-22 NOTE — Patient Instructions (Signed)
Sinsusitis Bacterial based on: Symptoms >10 days  Treatment: -considered steroid: we opted out for now, could consider later if improving but needs additional help -other symptomatic care with mucinex has been used- he has stopped at this point. Humidifier or nasal saline spray for blood nose -Antibiotic indicated: yes  Finally, we reviewed reasons to return to care including if symptoms worsen or persist or new concerns arise (particularly fever or shortness of breath)  Meds ordered this encounter  Medications  . amoxicillin-clavulanate (AUGMENTIN) 875-125 MG tablet    Sig: Take 1 tablet by mouth 2 (two) times daily.    Dispense:  14 tablet    Refill:  0

## 2016-11-22 NOTE — Progress Notes (Addendum)
PCP: Garret Reddish, MD  Subjective:  [name] is a 71 y.o. year old very pleasant male patient who presents with sinusitis symptoms including nasal congestion thick yellow discharge but minimal sinus tenderness. States has had several infections recently and has been seen at urgent care. Feels like treated then gets sick again in 2-3 weeks. Has been a hard winter. Also CLL treated by [name].  -other symptoms include: some fatigue.  -day of illness: at least 14 (before Christmas) -Symptoms are not improving -previous treatments: mucinex for a week (helped at first) -sick contacts/travel/risks: denies flu exposure.   ROS-denies fever, SOB, NVD, tooth pain  Pertinent Past Medical History-  Patient Active Problem List   Diagnosis Date Noted  . S/P Bioprosthetic AVR (aortic valve replacement) in 2011 03/22/2015    Priority: High  . Recurrent pulmonary embolism ([place]) 11/09/2014    Priority: High  . Chronic lymphocytic leukemia ([place]) 11/11/2013    Priority: High  . Atrial fibrillation ([place]) 04/20/2007    Priority: High  . PROSTATE CANCER, HX OF 04/20/2007    Priority: High  . Cervical spondylosis     Priority: Medium  . Migraine 10/08/2010    Priority: Medium  . Insomnia 02/03/2008    Priority: Medium  . Anxiety state 09/06/2007    Priority: Medium  . HLD (hyperlipidemia) 04/20/2007    Priority: Medium  . Essential hypertension 04/20/2007    Priority: Medium  . Lung nodule 11/02/2014    Priority: Low  . GERD 12/11/2009    Priority: Low  . MITRAL REGURGITATION 04/07/2009    Priority: Low  . Former smoker 08/29/2016    Medications- reviewed  Current Outpatient Prescriptions  Medication Sig Dispense Refill  . diclofenac sodium (VOLTAREN) 1 % GEL Apply 2 g topically as needed.    Marland Kitchen FLUoxetine (PROZAC) 10 MG capsule Take 1 capsule (10 mg total) by mouth daily. 90 capsule 3  . imipramine (TOFRANIL) 10 MG tablet Take 1 tablet (10 mg total) by mouth 2 (two) times  daily. 90 tablet 3  . LORazepam (ATIVAN) 0.5 MG tablet Take 1 tablet (0.5 mg total) by mouth daily as needed for anxiety. 30 tablet 5  . metoprolol succinate (TOPROL-XL) 25 MG 24 hr tablet TAKE 1 TABLET BY MOUTH EVERY DAY 30 tablet 8  . rivaroxaban (XARELTO) 20 MG TABS tablet Take 1 tablet (20 mg total) by mouth daily with supper. 90 tablet 1  . rosuvastatin (CRESTOR) 10 MG tablet Take 1 tablet by mouth every Monday, Wednesday, and Friday. 45 tablet 3   No current facility-administered medications for this visit.     Objective: BP 132/88 (BP Location: Left Arm, Patient Position: Sitting, Cuff Size: Large)   Pulse 75   Temp 98.3 F (36.8 C) (Oral)   Ht 5\' 10"  (1.778 m)   Wt 210 lb 9.6 oz (95.5 kg)   SpO2 93%   BMI 30.22 kg/m  Gen: NAD, resting comfortably HEENT: Turbinates erythematous with yellow drainage, TM normal, pharynx mildly erythematous with no tonsilar exudate or edema, minimal sinus tenderness CV: RRR no murmurs rubs or gallops Lungs: CTAB no crackles, wheeze, rhonchi Abdomen: soft/nontender/nondistended/normal bowel sounds. No rebound or guarding.  Ext: no edema Skin: warm, dry, no rash Neuro: grossly normal, moves all extremities  Assessment/Plan:  Sinsusitis Patient also with CLL and likely immunosuppression as a result. Higher risk patient as result Bacterial based on: Symptoms >10 days  Treatment: -considered steroid: we opted out for now, could consider later if improving  but needs additional help -other symptomatic care with mucinex has been used- he has stopped at this point. Humidifier or nasal saline spray for blood nose -Antibiotic indicated: yes  Finally, we reviewed reasons to return to care including if symptoms worsen or persist or new concerns arise (particularly fever or shortness of breath)  Meds ordered this encounter  Medications  . amoxicillin-clavulanate (AUGMENTIN) 875-125 MG tablet    Sig: Take 1 tablet by mouth 2 (two) times daily.     Dispense:  14 tablet    Refill:  0    Garret Reddish, MD

## 2016-11-25 ENCOUNTER — Telehealth: Payer: Self-pay | Admitting: Family Medicine

## 2016-11-25 NOTE — Telephone Encounter (Signed)
° ° ° ° °  Pt call to say he saw on my chart that he need to have a hep c and a tdap and is asking if this is correct. If so I will call and get him scheduled

## 2016-11-26 ENCOUNTER — Other Ambulatory Visit: Payer: Medicare Other

## 2016-11-26 ENCOUNTER — Encounter: Payer: Self-pay | Admitting: Neurology

## 2016-11-26 ENCOUNTER — Ambulatory Visit (INDEPENDENT_AMBULATORY_CARE_PROVIDER_SITE_OTHER): Payer: Medicare Other | Admitting: Neurology

## 2016-11-26 VITALS — BP 134/82 | HR 70 | Ht 70.0 in | Wt 210.5 lb

## 2016-11-26 DIAGNOSIS — H538 Other visual disturbances: Secondary | ICD-10-CM

## 2016-11-26 DIAGNOSIS — G4485 Primary stabbing headache: Secondary | ICD-10-CM | POA: Diagnosis not present

## 2016-11-26 DIAGNOSIS — H532 Diplopia: Secondary | ICD-10-CM

## 2016-11-26 DIAGNOSIS — I10 Essential (primary) hypertension: Secondary | ICD-10-CM

## 2016-11-26 NOTE — Patient Instructions (Signed)
We will check a myasthenia gravis panel.  If it is negative, I would like you evaluated by Dr. Gershon Crane

## 2016-11-26 NOTE — Telephone Encounter (Signed)
Tdap is due 04/15/17  Hepatitis C screening could be done with next set of bloodwork. Not an immunization. He can come by under z72.89 if prefers to not wait

## 2016-11-26 NOTE — Telephone Encounter (Signed)
Does he need a printed script for the tdap to take to his pharmacy? Is he ok to come by and do the Hep C screen?

## 2016-11-26 NOTE — Progress Notes (Signed)
NEUROLOGY FOLLOW UP OFFICE NOTE  [facility] [id number]  HISTORY OF PRESENT ILLNESS: [name] is a 71 year old right-handed male with hypertension, PAF, hyperlipidemia, anxiety, and history of PE and CLL who follows up for primary stabbing headache.    He did not tolerate gabapentin or topiramate.  He decided to forego any other preventive medication and headaches have resolved.  He reports improved sleep, which may be contributing to resolution of headaches.  He reports a new issue.  Over the past month, he has had 3 or 4 episodes of transient blurred vision.  It is not double vision, but rather difficulty focusing.  It resolves if he closes either eye.  There is no associated headache or focal numbness or weakness.  It only lasts 1 to 2 minutes.  It does not occur any particular time of day.  It has occurred while driving (causing sensation that the lines on the road were crossing), while looking at his phone or tablet, or not particularly focusing on anything.  There is no particular trigger.  He wonders if it is related to stress.  He denies neck weakness, trouble swallowing, trouble breathing or weakness of his extremities.  PAST MEDICAL HISTORY: Past Medical History:  Diagnosis Date  . Anticoagulants causing adverse effect in therapeutic use   . Anxiety    Paxil seemed to cause odd neuro side effects; better on prozac as of 09/2015  . Aortic regurgitation 04/2007 surgery   Resolved with [facility]R at [facility]  . Cervical spondylosis    ESI by [name] in Ortho. 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 Chronic lymphocytic leukemia (CLL), B-cell ([place]) approx 2012   stable on f/u's with [name] (most recent 10/2016)  . History of prostate cancer 2003   Rad prost  . History of pulmonary embolism 2011; 10/2014   Recurrence off of anticoagulation 10/2014: per hem/onc ([name]) pt needs lifelong anticoagulation (hypercoag w/u neg).  . Hyperlipidemia    statin-intolerant, except for  crestor low dose.  Marland Kitchen Hypertension   . Migraine syndrome   . Mitral regurgitation   . PAF (paroxysmal atrial fibrillation) ([place])   . Panic attacks    "           "             "                  "                  "                       "                             "                         . Pulmonary nodule, right 2015   RLL: resected at [facility] --VATS; Benign RLL nodule (fungal and AFB stains neg).  Plan is for [facility] thoracic surg to do f/u o/v & CT chest 1 yr   . Right-sided headache 07/2016   Primary stabbing HA or migraine per neuro (Dr. Tomi Likens).  Gabapentin started.    MEDICATIONS: Current Outpatient Prescriptions on File Prior to Visit  Medication Sig Dispense Refill  . amoxicillin-clavulanate (AUGMENTIN) 875-125 MG tablet Take 1 tablet by mouth  2 (two) times daily. 14 tablet 0  . diclofenac sodium (VOLTAREN) 1 % GEL Apply 2 g topically as needed.    Marland Kitchen FLUoxetine (PROZAC) 10 MG capsule Take 1 capsule (10 mg total) by mouth daily. 90 capsule 3  . imipramine (TOFRANIL) 10 MG tablet Take 1 tablet (10 mg total) by mouth 2 (two) times daily. 90 tablet 3  . LORazepam (ATIVAN) 0.5 MG tablet Take 1 tablet (0.5 mg total) by mouth daily as needed for anxiety. 30 tablet 5  . metoprolol succinate (TOPROL-XL) 25 MG 24 hr tablet TAKE 1 TABLET BY MOUTH EVERY DAY 30 tablet 8  . rivaroxaban (XARELTO) 20 MG TABS tablet Take 1 tablet (20 mg total) by mouth daily with supper. 90 tablet 1  . rosuvastatin (CRESTOR) 10 MG tablet Take 1 tablet by mouth every Monday, Wednesday, and Friday. 45 tablet 3   No current facility-administered medications on file prior to visit.     ALLERGIES: Allergies  Allergen Reactions  . Hydrocodone-Acetaminophen Shortness Of Breath  . Oxycodone Hcl Shortness Of Breath  . Telmisartan-Hctz Shortness Of Breath and Palpitations    Dizziness, too  . Ramipril   . Aspirin Other (See Comments) and Rash    Other Reaction: confusion Can't take high dose  . Atorvastatin Other  (See Comments)    myalgia  . Buspirone Hcl Other (See Comments)    headache  . Codeine Nausea Only and Other (See Comments)    Other Reaction: confusion  . Codeine Phosphate Itching and Nausea Only  . Hydrocodone-Acetaminophen Nausea Only and Other (See Comments)    Other Reaction: confusion  . Morphine Sulfate Itching  . Paroxetine Other (See Comments)    Paresthesias: R arm, R leg, r side of face  . Rabeprazole Other (See Comments)    headache    FAMILY HISTORY: Family History  Problem Relation Age of Onset  . Cancer Sister     uterine  . Colon cancer Sister 1  . Stroke Mother   . Prostate cancer Father   . Stroke Sister   . Stroke Brother   . Heart attack Neg Hx     SOCIAL HISTORY: Social History   Social History  . Marital status: Single    Spouse name: N/A  . Number of children: N/A  . Years of education: N/A   Occupational History  . Not on file.   Social History Main Topics  . Smoking status: Former Smoker    Packs/day: 1.00    Years: 12.00    Types: Cigarettes    Start date: 11/28/1968    Quit date: 01/29/1981  . Smokeless tobacco: Never Used     Comment: quit 33 years ago  . Alcohol use 0.0 oz/week     Comment: occasional  . Drug use: No  . Sexual activity: Not on file   Other Topics Concern  . Not on file   Social History Narrative   Single. No children. Lives alone      Retired from [employer].       Hobbies: travel, Scientist, product/process development, enjoys being outdoors including hiking    REVIEW OF SYSTEMS: Constitutional: No fevers, chills, or sweats, no generalized fatigue, change in appetite Eyes: No visual changes, double vision, eye pain Ear, nose and throat: No hearing loss, ear pain, nasal congestion, sore throat Cardiovascular: No chest pain, palpitations Respiratory:  No shortness of breath at rest or with exertion, wheezes GastrointestinaI: No nausea, vomiting, diarrhea, abdominal pain, fecal  incontinence Genitourinary:  No dysuria, urinary retention or frequency Musculoskeletal:  No neck pain, back pain Integumentary: No rash, pruritus, skin lesions Neurological: as above Psychiatric: No depression, insomnia, anxiety Endocrine: No palpitations, fatigue, diaphoresis, mood swings, change in appetite, change in weight, increased thirst Hematologic/Lymphatic:  No purpura, petechiae. Allergic/Immunologic: no itchy/runny eyes, nasal congestion, recent allergic reactions, rashes  PHYSICAL EXAM: Vitals:   11/26/16 1126  BP: 134/82  Pulse: 70   General: No acute distress.  Patient appears well-groomed.  normal body habitus. Head:  Normocephalic/atraumatic Eyes:  Fundi examined but not visualized Neck: supple, no paraspinal tenderness, full range of motion Heart:  Regular rate and rhythm Lungs:  Clear to auscultation bilaterally Back: No paraspinal tenderness Neurological Exam: alert and oriented to person, place, and time. Attention span and concentration intact, recent and remote memory intact, fund of knowledge intact.  Speech fluent and not dysarthric, language intact.  CN II-XII intact. Bulk and tone normal, muscle strength 5/5 throughout.  Sensation to light touch  intact.  Deep tendon reflexes 2+ throughout.  Finger to nose testing intact.  Gait normal  IMPRESSION: 1.  Transient blurred vision, binocular.  Unclear etiology.  It really do not suspect a cerebrovascular event.   2.  Primary stabbing headache, resolved. 3.  HTN  PLAN: 1.  We will check a myasthenia gravis panel.  If unremarkable, recommend that he be evaluated by his ophthalmologist, Dr. Gershon Crane.  2.  Blood pressure borderline elevated.  Should be rechecked with PCP.   Metta Clines, DO  CC:  Garret Reddish, MD

## 2016-12-03 LAB — MYASTHENIA GRAVIS PANEL 2
Acetylcholine Rec Binding: <0.3 nmol/L
Acetylcholine Rec Mod Ab: 17 % binding inhibition
Aceytlcholine Rec Bloc Ab: <15 % of inhibition (ref <15)

## 2016-12-04 ENCOUNTER — Telehealth: Payer: Self-pay

## 2016-12-04 NOTE — Telephone Encounter (Signed)
Called patient to give results. No answer. Will try later.

## 2016-12-04 NOTE — Telephone Encounter (Signed)
-----   Message from Pieter Partridge, DO sent at 12/03/2016  9:14 PM EST ----- Blood work is normal. Recommend that he follow up with Dr. Gershon Crane

## 2016-12-05 ENCOUNTER — Ambulatory Visit (INDEPENDENT_AMBULATORY_CARE_PROVIDER_SITE_OTHER): Payer: 59 | Admitting: Psychology

## 2016-12-05 DIAGNOSIS — F4321 Adjustment disorder with depressed mood: Secondary | ICD-10-CM | POA: Diagnosis not present

## 2016-12-05 NOTE — Telephone Encounter (Signed)
Called patient.  No answer.

## 2016-12-09 ENCOUNTER — Telehealth: Payer: Self-pay | Admitting: Family Medicine

## 2016-12-09 NOTE — Telephone Encounter (Signed)
Prior he was having sinus pressure and some thick yellow discharge- has that resolved? If that has and he is having more minor symptoms then would give it another week then update [name].  UPdate me if the above symptoms are persisting though

## 2016-12-09 NOTE — Telephone Encounter (Signed)
Patient is still having a cough would like to speak to the nurse.  Contact Info: [phone]

## 2016-12-09 NOTE — Telephone Encounter (Signed)
Spoke with patient who states he took the antibiotics. He stated 2-3 days after he stopped the antibiotics the symptoms came back. States that it was mentioned that he could extend the antibiotics?  Can you advise?  Symptoms include: Dry scratchy throat  Drainage Coughing (sometimes productive, yellowish) No fever

## 2016-12-09 NOTE — Telephone Encounter (Signed)
States he will wait until June for tdap , and he will wait for Hep C screening

## 2016-12-10 NOTE — Telephone Encounter (Signed)
Called and left a voicemail message asking for a return phone call 

## 2016-12-10 NOTE — Telephone Encounter (Signed)
Spoke with patient who states his sinus symptoms and the yellow discharge has resolved. He just went to the dentist and states he had to take 2000mg  amoxicillin. He was fine to wait another week and assess symptoms

## 2016-12-11 NOTE — Telephone Encounter (Signed)
This encounter was created in error - please disregard.

## 2016-12-11 NOTE — Telephone Encounter (Signed)
Called patient. Gave lab results. Patient verbalized understanding.  

## 2016-12-16 ENCOUNTER — Other Ambulatory Visit: Payer: Self-pay | Admitting: Family Medicine

## 2016-12-16 DIAGNOSIS — F411 Generalized anxiety disorder: Secondary | ICD-10-CM

## 2016-12-24 ENCOUNTER — Ambulatory Visit (INDEPENDENT_AMBULATORY_CARE_PROVIDER_SITE_OTHER): Payer: 59 | Admitting: Psychology

## 2016-12-24 DIAGNOSIS — F4321 Adjustment disorder with depressed mood: Secondary | ICD-10-CM | POA: Diagnosis not present

## 2016-12-26 NOTE — Progress Notes (Signed)
Patient ID: [name], male   DOB: 04-Jan-1946, 71 y.o.   MRN: [id number]    Cardiology Office Note   Date:  01/03/2017   ID:  [name], DOB October 22, 1946, MRN [id number]  PCP:  Garret Reddish, MD  Cardiologist:  [name]     Chief Complaint  Patient presents with  . Aortic valve insufficiency, etiology of cardiac valve diseas     History of Present Illness: [name] is a 71 y.o. male with a hx of severe aortic insufficiency, status post bioprosthetic AVR by [name] at [facility] in 05/2010, normal coronary arteries by cardiac catheterization in 2011, prior DVT, persistent LUE basilic/cephalic thrombus, recurrent pulmonary emboli (most recently 10/2014), PAF, Xarelto anticoagulation, HTN, HL, CLL, prostate CA status post radical prostatectomy, RLL lung nodule. The patient has been followed by [name] with oncology. PET scan has been negative for hypermetabolic activity regarding his lung nodule. However, recent chest CT demonstrated increasing size. Of note, there was resolution of pulmonary emboli. Resection has been recommended. The patient is being referred to a thoracic surgeon at [facility].    04/03/15  Had throascopic RLL wedge resection [place] Turned out to be benign granulomatous nodule And not cancer    Studies/Reports Reviewed Today:  Chest CTA 02/06/15 IMPRESSION: 1. Previously described pulmonary emboli have resolved. There are no new pulmonary emboli. 2. Right lower lobe pulmonary nodule has increased in size. It shows no appreciable hyper metabolism on the recent prior PET-CT. That given the size increase, biopsy should be considered. 3. No acute findings in the lungs. 4. Stable mild cardiomegaly and stable changes from previous cardiac surgery and aortic valve replacement.  Echo 04/02/16   Study Conclusions  - Left ventricle: The cavity size was mildly dilated. Wall   thickness was increased in a pattern of severe LVH. Systolic   function was  normal. The estimated ejection fraction was in the   range of 55% to 60%. Left ventricular diastolic function   parameters were normal. - Aortic valve: Mild thickening of leaflets no AR mean gradient   increased from 16 to 24 and peak from 30 to 40 mmHg since   09/2014. - Left atrium: The atrium was mildly dilated. - Atrial septum: No defect or patent foramen ovale was identified.  Upper Ext Venous Duplex 07/2014 Acute superficial thrombus noted in the left mid to distal basilic and mid cephalic vein. All other left upper extremity deep and superficial veins, show no evidence of intraluminal thrombus.  LHC 04/2010 Normal coronary arteries  08/2015  Normal carotid duplex  Reviewed his echo from today . AVR more thickened and peak gradient up from 30- >40 mmHg.  Also reviewed labs from [place]  Cr 1.7 and LDL 189  Disscussed issues of lipid control for deteriorating tissue valves   Switching primary to Dr Yong Channel . [facility] to [facility] and agent orange Which has been linked to CLL going to [place] for photography in October    Past Medical History:  Diagnosis Date  . Anticoagulants causing adverse effect in therapeutic use   . Anxiety    Paxil seemed to cause odd neuro side effects; better on prozac as of 09/2015  . Aortic regurgitation 04/2007 surgery   Resolved with tissue AVR at [facility]  . Cervical spondylosis    ESI by [name] in Ortho. 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 Chronic lymphocytic leukemia (CLL), B-cell ([place]) approx 2012   stable on  f/u's with [name] (most recent 10/2016)  . History of prostate cancer 2003   Rad prost  . History of pulmonary embolism 2011; 10/2014   Recurrence off of anticoagulation 10/2014: per hem/onc ([name]) pt needs lifelong anticoagulation (hypercoag w/u neg).  . Hyperlipidemia    statin-intolerant, except for crestor low dose.  Marland Kitchen Hypertension   . Migraine syndrome   . Mitral regurgitation   . PAF (paroxysmal atrial  fibrillation) ([place])   . Panic attacks    "           "             "                  "                  "                       "                             "                         . Pulmonary nodule, right 2015   RLL: resected at [facility] --VATS; Benign RLL nodule (fungal and AFB stains neg).  Plan is [facility] surg to do f/u o/v & CT chest 1 yr   . Right-sided headache 07/2016   Primary stabbing HA or migraine per neuro (Dr. Tomi Likens).  Gabapentin and topamax not tolerated.  These resolved spontaneously.    Past Surgical History:  Procedure Laterality Date  . AORTIC VALVE REPLACEMENT  2011   [facility]  (bioprosthetic)  . CARDIAC CATHETERIZATION  04/2010   Normal coronaries.  . Carotid dopplers  08/2015   NORMAL  . CATARACT EXTRACTION     L 12/06/09   R 09/14/09  . COLONOSCOPY  01/26/13   tic's, o/w normal.  ([facility])--recall 10 yrs.  Marland Kitchen PENILE PROSTHESIS IMPLANT    . PROSTATECTOMY  04/2002   for prostate cancer  . Pulmonary nodule resection  Fall/winter 2016   [facility]  . SHOULDER SURGERY     rotator cuff on both arms   . TRANSTHORACIC ECHOCARDIOGRAM  09/20/2014; 03/2016   mild LVH, EF 50-55%, wall motion nl, grade I diast dysfxn, prosth aort valve good,transaortic gradients decreased compared to echo 11/2013.   03/2016: EF 55-60%, normal LV function, severe LVH, normal diast fxn, mild increase in AV gradient compared to 09/2014 echo.     Current Outpatient Prescriptions  Medication Sig Dispense Refill  . amoxicillin-clavulanate (AUGMENTIN) 875-125 MG tablet Take 1 tablet by mouth 2 (two) times daily. 14 tablet 0  . diclofenac sodium (VOLTAREN) 1 % GEL Apply 2 g topically as needed.    Marland Kitchen FLUoxetine (PROZAC) 10 MG capsule TAKE 1 CAPSULE (10 MG TOTAL) BY MOUTH DAILY. 90 capsule 3  . imipramine (TOFRANIL) 10 MG tablet Take 1 tablet (10 mg total) by mouth 2 (two) times daily. 90 tablet 3  . LORazepam (ATIVAN) 0.5 MG tablet Take 1 tablet (0.5 mg total) by mouth daily as needed for anxiety. 30  tablet 5  . metoprolol succinate (TOPROL-XL) 25 MG 24 hr tablet TAKE 1 TABLET BY MOUTH EVERY DAY 30 tablet 8  . rivaroxaban (XARELTO) 20 MG TABS tablet Take 1 tablet (20 mg total) by mouth daily with supper.  90 tablet 1  . rosuvastatin (CRESTOR) 10 MG tablet Take 1 tablet by mouth every Monday, Wednesday, and Friday. 45 tablet 3   No current facility-administered medications for this visit.     Allergies:   Hydrocodone-acetaminophen; Oxycodone hcl; Telmisartan-hctz; Ramipril; Aspirin; Atorvastatin; Buspirone hcl; Codeine; Codeine phosphate; Hydrocodone-acetaminophen; Morphine sulfate; Paroxetine; and Rabeprazole    Social History:  The patient  reports that he quit smoking about 35 years ago. His smoking use included Cigarettes. He started smoking about 48 years ago. He has a 12.00 pack-year smoking history. He has never used smokeless tobacco. He reports that he drinks alcohol. He reports that he does not use drugs.   Family History:  The patient's family history includes Cancer in his sister; Colon cancer (age of onset: 72) in his sister; Prostate cancer in his father; Stroke in his brother, mother, and sister.    ROS:   Please see the history of present illness.   Review of Systems  All other systems reviewed and are negative.     PHYSICAL EXAM: VS:  BP 122/70   Pulse 76   Ht 5\' 10"  (1.778 m)   Wt 207 lb 12.8 oz (94.3 kg)   SpO2 99%   BMI 29.82 kg/m     Wt Readings from Last 3 Encounters:  01/03/17 207 lb 12.8 oz (94.3 kg)  11/26/16 210 lb 8 oz (95.5 kg)  11/22/16 210 lb 9.6 oz (95.5 kg)     GEN: Well nourished, well developed, in no acute distress  HEENT: normal  Neck: no JVD, left  carotid bruits, no masses Cardiac:  Normal S1/accentuated S2, RRR; no murmur ,  no rubs or gallops, no edema  Respiratory:  clear to auscultation bilaterally, no wheezing, rhonchi or rales. GI: soft, nontender, nondistended, + BS MS: no deformity or atrophy  Skin: warm and dry  Neuro:   CNs II-XII intact, Strength and sensation are intact Psych: Normal affect   EKG:   03/22/15  NSR, HR 67, LAFB, RBBB, no change from prior tracing  04/02/16 SR rate 62 LAD RBBB LVH    Recent Labs: 06/27/2016: ALT 24; BUN 20.9; Creatinine 1.6; HGB 14.4; Platelets 146; Potassium 5.1; Sodium 139    Lipid Panel    Component Value Date/Time   CHOL 202 (H) 06/13/2016 0755   TRIG 97.0 06/13/2016 0755   HDL 56.60 06/13/2016 0755   CHOLHDL 4 06/13/2016 0755   VLDL 19.4 06/13/2016 0755   LDLCALC 126 (H) 06/13/2016 0755   LDLDIRECT 169.0 03/30/2013 0936      ASSESSMENT AND PLAN:  Aortic insufficiency S/P Bioprosthetic AVR (aortic valve replacement) in 2011 Increased gradients on echo continue anticoagulation. F/U echo 03/2017   History of DVT (deep vein thrombosis) and Recurrent pulmonary embolism Continue Xarelto. This is managed by heme/onc.   Paroxysmal atrial fibrillation Maintaining NSR.  Continue Toprol-XL and Xarelto.  Essential hypertension Borderline control.  Continue to monitor.  HLD (hyperlipidemia) Has had leg pain with statins in past. Taking crestor 3 x/week   Lung nodule  Post VATS at [facility]  Benign nodule healing well   Carotid Bruit:  Left duplex no stenosis f/u 08/2017  Depression:  Better off paroxetine now on low dose prozac with tofranil  Headache:  F/u primary PRN tylenol ok has schrap metal on right side of head Cant have MRI  [id number] with primary typically Cr around 1.7   BBB:  RBBB LAD post AVR ECG q 6 months follow for more advanced AV block  CLL:  F/u with Dr Jonette Eva  Lab Results  Component Value Date   WBC 15.7 (H) 06/27/2016   HGB 14.4 06/27/2016   HCT 42.1 06/27/2016   MCV 94 06/27/2016   PLT 146 06/27/2016      Current medicines are reviewed at length with the patient today.  Concerns regarding medicines are as outlined above.  The following changes have been made:       Jenkins Rouge

## 2016-12-27 ENCOUNTER — Other Ambulatory Visit: Payer: Self-pay

## 2016-12-27 ENCOUNTER — Ambulatory Visit: Payer: Self-pay | Admitting: Hematology & Oncology

## 2017-01-02 ENCOUNTER — Ambulatory Visit (INDEPENDENT_AMBULATORY_CARE_PROVIDER_SITE_OTHER): Payer: 59 | Admitting: Psychology

## 2017-01-02 DIAGNOSIS — F4321 Adjustment disorder with depressed mood: Secondary | ICD-10-CM | POA: Diagnosis not present

## 2017-01-03 ENCOUNTER — Ambulatory Visit (INDEPENDENT_AMBULATORY_CARE_PROVIDER_SITE_OTHER): Payer: Medicare Other | Admitting: Cardiovascular Disease

## 2017-01-03 ENCOUNTER — Encounter: Payer: Self-pay | Admitting: Cardiovascular Disease

## 2017-01-03 VITALS — BP 122/70 | HR 76 | Ht 70.0 in | Wt 207.8 lb

## 2017-01-03 DIAGNOSIS — I351 Nonrheumatic aortic (valve) insufficiency: Secondary | ICD-10-CM

## 2017-01-03 DIAGNOSIS — Z952 Presence of prosthetic heart valve: Secondary | ICD-10-CM | POA: Diagnosis not present

## 2017-01-03 NOTE — Patient Instructions (Addendum)
Medication Instructions:  Your physician recommends that you continue on your current medications as directed. Please refer to the Current Medication list given to you today.  Labwork: NONE  Testing/Procedures: Your physician has requested that you have an echocardiogram in MAY 2018. Echocardiography is a painless test that uses sound waves to create images of your heart. It provides your doctor with information about the size and shape of your heart and how well your heart's chambers and valves are working. This procedure takes approximately one hour. There are no restrictions for this procedure.   Follow-Up: Your physician wants you to follow-up in: 6 months with Dr. Nishan. You will receive a reminder letter in the mail two months in advance. If you don't receive a letter, please call our office to schedule the follow-up appointment.   If you need a refill on your cardiac medications before your next appointment, please call your pharmacy.    

## 2017-01-30 ENCOUNTER — Ambulatory Visit (INDEPENDENT_AMBULATORY_CARE_PROVIDER_SITE_OTHER): Payer: 59 | Admitting: Psychology

## 2017-01-30 DIAGNOSIS — F4321 Adjustment disorder with depressed mood: Secondary | ICD-10-CM

## 2017-02-15 ENCOUNTER — Other Ambulatory Visit: Payer: Self-pay | Admitting: Family Medicine

## 2017-02-20 NOTE — Progress Notes (Signed)
Subjective:   [name] is a 71 y.o. male who presents for Medicare Annual/Subsequent preventive examination.  The Patient was informed that the wellness visit is to identify future health risk and educate and initiate measures that can reduce risk for increased disease through the lifespan.    NO ROS; Medicare Wellness Visit Hx radical prostatectomy Aortic valve replacement 04/2007 at [place] being monitored  Hx of blurred vision but states he thinks this was related to anxiety Chronic kidney disease;  [facility]   Describes health as good, fair or great? fair  Psychosocial Single; lives alone Tai chi instructor still teaches  Left leg smaller than right; helps with balance  [employer] for 20 years prior to tai chi Likes Hiking Prior to the heart surgery he was training   Preventive Screening -Counseling & Management  PSA 11/2014 Colonoscopy 01/2013  Repeat in 10 years 01/2023 Tdap due this year - [place] may be more cost effective   Smoking history - former smoker 12 pack years, Quit 66  30 pack hx but quit 71 yo  Also educated regarding AAA for male tobacco users with smoking hx  [facility]; hx valve replacement Lung nodule removed at [place] status; No Smokers in the home ETOH - occasional   Medication adherence or issues? no  RISK FACTORS Diet Does not like steak;  Eats a lot of fish, chicken and lamb Most of meals are at home Cereal in the am Lunch; salad; sushi   Regular exercise  Walking, Hikes  [place];  4 miles hike   Cardiac Risk Factors:  Men > 65 Hyperlipidemia - chol 202; HDL 56; LDL 126 and trig 97 Diabetes neg; a1c 5.7 and BS 83  Family History - mother had a stroke; father had prostate ca; sister colon cancer and other family members with stroke Obesity BMI 29 but appears fairly lean with exercise;   Fall risk no falls; regular tai chi Given education on "Fall Prevention in the  Home" for more safety tips the patient can apply as appropriate.   Mobility of Functional changes this year? no  Mental Health:   States dx with PTSD in the last year or so, otherwise no hx of issues.  Dr. Cheryln Manly at [place] Discussed discontinuing anti-depressant but deferred to his MD  Not depressed currently;  Educated that if mentally good; may  Not be wise to change this at this time.   Any emotional problems? Anxious, depressed, irritable, sad or blue? Not currently  Denies feeling depressed or hopeless; voices pleasure in daily life How many social activities have you been engaged in within the last 2 weeks? no Denies being feelings of helplessness or SI   Hearing Screening Comments: [facility] checked [facility]  Pieces of metal behind his ear and was getting h/a in right side of head  Vision Screening Comments: [facility] checks vision; Checked a few weeks ago and had h/a  Issues seem to be resolved at present  Activities of Daily Living - See functional screen   Cognitive testing; Ad8 score; 0 or less than 2  MMSE deferred or completed if AD8 + 2 issues  Advanced Directives yes  Patient Care Team: [name], MD as PCP - General (Family Medicine) Ladell Pier, MD as Consulting Physician (Oncology) Josue Hector, MD as Consulting Physician (Cardiology) Chesley Mires, MD as Consulting Physician (Pulmonary  Disease) Volanda Napoleon, MD as Consulting Physician (Oncology) Pieter Partridge, DO as Consulting Physician (Neurology)   Immunization History  Administered Date(s) Administered  . Influenza Split 08/16/2011, 08/14/2012  . Influenza Whole 08/17/2010  . Influenza, High Dose Seasonal PF 08/23/2013, 08/29/2016  . Influenza,inj,Quad PF,36+ Mos 09/05/2014, 08/02/2015  . Pneumococcal Conjugate-13 01/23/2015  . Pneumococcal Polysaccharide-23 03/15/2011  . Td 04/16/2007  . Zoster 08/16/2011   Required Immunizations needed today  Screening test up to date or  reviewed for plan of completion Health Maintenance Due  Topic Date Due  . Hepatitis C Screening  10-31-1946   Will check to see if he had this at the [place]. He is not sure but is going there today and will ask.   Cardiac Risk Factors include: advanced age (>49mn, >>55women);hypertension     Objective:    Vitals: BP 130/84   Pulse 78   Ht '5\' 10"'$  (1.778 m)   Wt 207 lb (93.9 kg)   SpO2 95%   BMI 29.70 kg/m   Body mass index is 29.7 kg/m.  Tobacco History  Smoking Status  . Former Smoker  . Packs/day: 1.00  . Years: 12.00  . Types: Cigarettes  . Start date: 11/28/1968  . Quit date: 01/29/1981  Smokeless Tobacco  . Never Used    Comment: quit 71yo      Counseling given: Yes   Past Medical History:  Diagnosis Date  . Anticoagulants causing adverse effect in therapeutic use   . Anxiety    Paxil seemed to cause odd neuro side effects; better on prozac as of 09/2015  . Aortic regurgitation 04/2007 surgery   [facility] . Cerv[facility]    ESI by Dr. WJacelyn Gripin [facility]. 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 Chronic lymphocytic leukemia (CLL), B-cell ([place] approx 2012   stable on f/u's with Dr. EMarin Olp(most recent 10/2016)  . History of prostate cancer 2003   Rad prost  . History of pulmonary embolism 2011; 10/2014   Recurrence off of anticoagulation 10/2014: per hem/onc (Dr. EMarin Olp pt needs lifelong anticoagulation (hypercoag w/u neg).  . Hyperlipidemia    statin-intolerant, except for crestor low dose.  .Marland KitchenHypertension   . Migraine syndrome   . Mitral regurgitation   . PA[place]) [place]   . Panic attacks    "           "             "                  "                  "                       "                             "                         . Pulmonary nodule, right 2015   [facility]; Benign RLL nodule (fungal and AFB stains neg).  [facility]v & CT chest 1 yr   .  Right-sided headache 07/2016   Primary stabbing HA or migraine per neuro (Dr. JTomi Likens.  Gabapentin and topamax not tolerated.  These resolved spontaneously.   Past Surgical History:  Procedure Laterality Date  . AORTIC VALVE REPLACEMENT  2011   [facility]  (bioprosthetic)  . CARDIAC CATHETERIZATION  04/2010   Normal coronaries.  . Carotid dopplers  08/2015   NORMAL  . CATARACT EXTRACTION     L 12/06/09   R 09/14/09  . COLONOSCOPY  01/26/13   tic's, o/w normal.  ([facility])--recall 10 yrs.  Marland Kitchen PENILE PROSTHESIS IMPLANT    . PROSTATECTOMY  04/2002   for prostate cancer  . Pulmonary nodule resection  Fall/winter 2016   [facility]  . SHOULDER SURGERY     rotator cuff on both arms   . TRANSTHORACIC ECHOCARDIOGRAM  09/20/2014; 03/2016   mild LVH, EF 50-55%, wall motion nl, grade I diast dysfxn, prosth aort valve good,transaortic gradients decreased compared to echo 11/2013.   03/2016: EF 55-60%, normal LV function, severe LVH, normal diast fxn, mild increase in AV gradient compared to 09/2014 echo.   Family History  Problem Relation Age of Onset  . Cancer Sister     uterine  . Colon cancer Sister 44  . Stroke Mother   . Prostate cancer Father   . Stroke Sister   . Stroke Brother   . Heart attack Neg Hx    History  Sexual Activity  . Sexual activity: Not on file    Outpatient Encounter Prescriptions as of 02/21/2017  Medication Sig  . diclofenac sodium (VOLTAREN) 1 % GEL Apply 2 g topically as needed.  Marland Kitchen FLUoxetine (PROZAC) 10 MG capsule TAKE 1 CAPSULE (10 MG TOTAL) BY MOUTH DAILY.  Marland Kitchen imipramine (TOFRANIL) 10 MG tablet Take 1 tablet (10 mg total) by mouth 2 (two) times daily.  Marland Kitchen LORazepam (ATIVAN) 0.5 MG tablet TAKE 1 TABLET BY MOUTH EVERY DAY AS NEEDED FOR ANXIETY  . metoprolol succinate (TOPROL-XL) 25 MG 24 hr tablet TAKE 1 TABLET BY MOUTH EVERY DAY  . rivaroxaban (XARELTO) 20 MG TABS tablet Take 1 tablet (20 mg total) by mouth daily with supper.  . rosuvastatin (CRESTOR) 10 MG tablet Take 1 tablet  by mouth every Monday, Wednesday, and Friday.  Marland Kitchen amoxicillin-clavulanate (AUGMENTIN) 875-125 MG tablet Take 1 tablet by mouth 2 (two) times daily.   No facility-administered encounter medications on file as of 02/21/2017.     Activities of Daily Living In your present state of health, do you have any difficulty performing the following activities: 02/21/2017  Hearing? N  Vision? N  Difficulty concentrating or making decisions? N  Walking or climbing stairs? N  Dressing or bathing? N  Doing errands, shopping? N  Preparing Food and eating ? N  Using the Toilet? N  In the past six months, have you accidently leaked urine? N  Do you have problems with loss of bowel control? N  Managing your Medications? N  Managing your Finances? N  Housekeeping or managing your Housekeeping? N  Some recent data might be hidden    Patient Care Team: Shelva Majestic, MD as PCP - General (Family Medicine) Ladene Artist, MD as Consulting Physician (Oncology) Wendall Stade, MD as Consulting Physician (Cardiology) Coralyn Helling, MD as Consulting Physician (Pulmonary Disease) Josph Macho, MD as Consulting Physician (Oncology) Drema Dallas, DO as Consulting Physician (Neurology)   Assessment:     Exercise Activities and Dietary recommendations Current Exercise Habits: Home exercise routine, Type of exercise: strength training/weights;walking, Time (Minutes): 60, Intensity: Moderate  Goals    . patient  Traveling soon! Do more photography!       Fall Risk Fall Risk  02/21/2017 11/22/2016 06/27/2016 11/15/2015 11/08/2015  Falls in the past year? No No No No No  Number falls in past yr: - - - - -  Risk for fall due to : - - - - -   Depression Screen PHQ 2/9 Scores 02/21/2017 11/22/2016 11/15/2015 08/23/2013  PHQ - 2 Score 0 0 0 0    Cognitive Function MMSE - Mini Mental State Exam 02/21/2017  Not completed: (No Data)     no memory issues at present    Immunization History    Administered Date(s) Administered  . Influenza Split 08/16/2011, 08/14/2012  . Influenza Whole 08/17/2010  . Influenza, High Dose Seasonal PF 08/23/2013, 08/29/2016  . Influenza,inj,Quad PF,36+ Mos 09/05/2014, 08/02/2015  . Pneumococcal Conjugate-13 01/23/2015  . Pneumococcal Polysaccharide-23 03/15/2011  . Td 04/16/2007  . Zoster 08/16/2011   Screening Tests Health Maintenance  Topic Date Due  . Hepatitis C Screening  May 27, 1946  . TETANUS/TDAP  04/15/2017  . INFLUENZA VACCINE  06/11/2017  . COLONOSCOPY  01/27/2023  . PNA vac Low Risk Adult  Completed      Plan:      PCP Notes  Health Maintenance Hep C due but currently being served by [place] as well. [facility] - will check when he goes back and let us know if completed. Will defer for now.   Seeing Dr. Yong Channel post trip to [place] in Sept for photo; Enjoys taking photos; Would like to know where he needs to go to get his immunizations and if is there an optimal time frame?  Tdap due in June. Can take at the [place] as well or at pharmacy   The patient has had recent lobectomy 03/2015 and AVR 2011 Also had CT pelvis; Deferred AAA with smoking hx   Abnormal Screens no  Referrals none today  Patient concerns; where to get immunizations for trip   Nurse Concerns; none, very pleasant and engaging   Next PCP apt in October    During the course of the visit the patient was educated and counseled about the following appropriate screening and preventive services:   Vaccines to include Pneumoccal, Influenza, Hepatitis B, Td, Zostavax, HCV  Electrocardiogram  Cardiovascular Disease  Colorectal cancer screening - 01/2023  Diabetes screening neg  Prostate Cancer Screening  11/2014  Glaucoma screening eyes checked at the [facility]  Nutrition counseling; discussed  Smoking cessation counseling n/a   Patient Instructions (the written plan) was given to the patient.    Wynetta Fines, RN  02/21/2017

## 2017-02-21 ENCOUNTER — Ambulatory Visit (INDEPENDENT_AMBULATORY_CARE_PROVIDER_SITE_OTHER): Payer: Medicare Other

## 2017-02-21 VITALS — BP 130/84 | HR 78 | Ht 70.0 in | Wt 207.0 lb

## 2017-02-21 DIAGNOSIS — Z0000 Encounter for general adult medical examination without abnormal findings: Secondary | ICD-10-CM | POA: Diagnosis not present

## 2017-02-21 NOTE — Progress Notes (Signed)
I have reviewed and agree with note, evaluation, plan.  1. Agree does not need AAA screen 2. For travel medicine Manuela Schwartz- can you give him this info please? He can be seen at any time and they will tell him when he needs immunizations.  [facility] [phone] (cheaper per one of my patients-) -Also have [facility] [place]   Garret Reddish, MD

## 2017-02-21 NOTE — Patient Instructions (Addendum)
[name] , Thank you for taking time to come for your Medicare Wellness Visit. I appreciate your ongoing commitment to your health goals. Please review the following plan we discussed and let me know if I can assist you in the future.   You call call my chart [phone]; this is the my chart help desk  Check with the [place] regarding you Hep c  These are the goals we discussed: Goals    . patient          Traveling soon! Do more photography!        This is a list of the screening recommended for you and due dates:  Health Maintenance  Topic Date Due  .  Hepatitis C: One time screening is recommended by Center for Disease Control  (CDC) for  adults born from [birth year] through 1965.   [birth year]  . Tetanus Vaccine  04/15/2017  . Flu Shot  06/11/2017  . Colon Cancer Screening  01/27/2023  . Pneumonia vaccines  Completed    Prevention of falls: Remove rugs or any tripping hazards in the home Use Non slip mats in bathtubs and showers Placing grab bars next to the toilet and or shower Placing handrails on both sides of the stair way Adding extra lighting in the home.   Personal safety issues reviewed:  1. Consider starting a community watch program per [facility] 2.  Changes batteries is smoke detector and/or carbon monoxide detector  3.  If you have firearms; keep them in a safe place 4.  Wear protection when in the sun; Always wear sunscreen or a hat; It is good to have your doctor check your skin annually or review any new areas of concern 5. Driving safety; Keep in the right lane; stay 3 car lengths behind the car in front of you on the highway; look 3 times prior to pulling out; carry your cell phone everywhere you go!    Learn about the Yellow Dot program:  The program allows first responders at your emergency to have access to who your physician is, as well as your medications and medical conditions.  Citizens requesting the Yellow Dot Packages should contact  Master Corporal Nunzio Cobbs at the [facility] [phone] for the first week of the program and beginning the week after Easter citizens should contact their Scientist, physiological.       Fall Prevention in the Home Falls can cause injuries. They can happen to people of all ages. There are many things you can do to make your home safe and to help prevent falls. What can I do on the outside of my home?  Regularly fix the edges of walkways and driveways and fix any cracks.  Remove anything that might make you trip as you walk through a door, such as a raised step or threshold.  Trim any bushes or trees on the path to your home.  Use bright outdoor lighting.  Clear any walking paths of anything that might make someone trip, such as rocks or tools.  Regularly check to see if handrails are loose or broken. Make sure that both sides of any steps have handrails.  Any raised decks and porches should have guardrails on the edges.  Have any leaves, snow, or ice cleared regularly.  Use sand or salt on walking paths during winter.  Clean up any spills in your garage right away. This includes oil or grease spills. What can I do in the  bathroom?  Use night lights.  Install grab bars by the toilet and in the tub and shower. Do not use towel bars as grab bars.  Use non-skid mats or decals in the tub or shower.  If you need to sit down in the shower, use a plastic, non-slip stool.  Keep the floor dry. Clean up any water that spills on the floor as soon as it happens.  Remove soap buildup in the tub or shower regularly.  Attach bath mats securely with double-sided non-slip rug tape.  Do not have throw rugs and other things on the floor that can make you trip. What can I do in the bedroom?  Use night lights.  Make sure that you have a light by your bed that is easy to reach.  Do not use any sheets or blankets that are too big for your bed. They should  not hang down onto the floor.  Have a firm chair that has side arms. You can use this for support while you get dressed.  Do not have throw rugs and other things on the floor that can make you trip. What can I do in the kitchen?  Clean up any spills right away.  Avoid walking on wet floors.  Keep items that you use a lot in easy-to-reach places.  If you need to reach something above you, use a strong step stool that has a grab bar.  Keep electrical cords out of the way.  Do not use floor polish or wax that makes floors slippery. If you must use wax, use non-skid floor wax.  Do not have throw rugs and other things on the floor that can make you trip. What can I do with my stairs?  Do not leave any items on the stairs.  Make sure that there are handrails on both sides of the stairs and use them. Fix handrails that are broken or loose. Make sure that handrails are as long as the stairways.  Check any carpeting to make sure that it is firmly attached to the stairs. Fix any carpet that is loose or worn.  Avoid having throw rugs at the top or bottom of the stairs. If you do have throw rugs, attach them to the floor with carpet tape.  Make sure that you have a light switch at the top of the stairs and the bottom of the stairs. If you do not have them, ask someone to add them for you. What else can I do to help prevent falls?  Wear shoes that:  Do not have high heels.  Have rubber bottoms.  Are comfortable and fit you well.  Are closed at the toe. Do not wear sandals.  If you use a stepladder:  Make sure that it is fully opened. Do not climb a closed stepladder.  Make sure that both sides of the stepladder are locked into place.  Ask someone to hold it for you, if possible.  Clearly mark and make sure that you can see:  Any grab bars or handrails.  First and last steps.  Where the edge of each step is.  Use tools that help you move around (mobility aids) if they are  needed. These include:  Canes.  Walkers.  Scooters.  Crutches.  Turn on the lights when you go into a dark area. Replace any light bulbs as soon as they burn out.  Set up your furniture so you have a clear path. Avoid moving your furniture around.  If any of your floors are uneven, fix them.  If there are any pets around you, be aware of where they are.  Review your medicines with your doctor. Some medicines can make you feel dizzy. This can increase your chance of falling. Ask your doctor what other things that you can do to help prevent falls. This information is not intended to replace advice given to you by your health care provider. Make sure you discuss any questions you have with your health care provider. Document Released: 08/24/2009 Document Revised: 04/04/2016 Document Reviewed: 12/02/2014 Elsevier Interactive Patient Education  2017 [place] Maintenance, Male A healthy lifestyle and preventive care is important for your health and wellness. Ask your health care provider about what schedule of regular examinations is right for you. What should I know about weight and diet?  Eat a Healthy Diet  Eat plenty of vegetables, fruits, whole grains, low-fat dairy products, and lean protein.  Do not eat a lot of foods high in solid fats, added sugars, or salt. Maintain a Healthy Weight  Regular exercise can help you achieve or maintain a healthy weight. You should:  Do at least 150 minutes of exercise each week. The exercise should increase your heart rate and make you sweat (moderate-intensity exercise).  Do strength-training exercises at least twice a week. Watch Your Levels of Cholesterol and Blood Lipids  Have your blood tested for lipids and cholesterol every 5 years starting at 71 years of age. If you are at high risk for heart disease, you should start having your blood tested when you are 71 years old. You may need to have your cholesterol levels  checked more often if:  Your lipid or cholesterol levels are high.  You are older than 71 years of age.  You are at high risk for heart disease. What should I know about cancer screening? Many types of cancers can be detected early and may often be prevented. Lung Cancer  You should be screened every year for lung cancer if:  You are a current smoker who has smoked for at least 30 years.  You are a former smoker who has quit within the past 15 years.  Talk to your health care provider about your screening options, when you should start screening, and how often you should be screened. Colorectal Cancer  Routine colorectal cancer screening usually begins at 71 years of age and should be repeated every 5-10 years until you are 71 years old. You may need to be screened more often if early forms of precancerous polyps or small growths are found. Your health care provider may recommend screening at an earlier age if you have risk factors for colon cancer.  Your health care provider may recommend using home test kits to check for hidden blood in the stool.  A small camera at the end of a tube can be used to examine your colon (sigmoidoscopy or colonoscopy). This checks for the earliest forms of colorectal cancer. Prostate and Testicular Cancer  Depending on your age and overall health, your health care provider may do certain tests to screen for prostate and testicular cancer.  Talk to your health care provider about any symptoms or concerns you have about testicular or prostate cancer. Skin Cancer  Check your skin from head to toe regularly.  Tell your health care provider about any new moles or changes in moles, especially if:  There is a change in a mole's size, shape, or color.  You have a mole that is larger than a pencil eraser.  Always use sunscreen. Apply sunscreen liberally and repeat throughout the day.  Protect yourself by wearing long sleeves, pants, a wide-brimmed hat,  and sunglasses when outside. What should I know about heart disease, diabetes, and high blood pressure?  If you are 15-29 years of age, have your blood pressure checked every 3-5 years. If you are 89 years of age or older, have your blood pressure checked every year. You should have your blood pressure measured twice-once when you are at a hospital or clinic, and once when you are not at a hospital or clinic. Record the average of the two measurements. To check your blood pressure when you are not at a hospital or clinic, you can use:  An automated blood pressure machine at a pharmacy.  A home blood pressure monitor.  Talk to your health care provider about your target blood pressure.  If you are between 56-21 years old, ask your health care provider if you should take aspirin to prevent heart disease.  Have regular diabetes screenings by checking your fasting blood sugar level.  If you are at a normal weight and have a low risk for diabetes, have this test once every three years after the age of 25.  If you are overweight and have a high risk for diabetes, consider being tested at a younger age or more often.  A one-time screening for abdominal aortic aneurysm (AAA) by ultrasound is recommended for men aged 55-75 years who are current or former smokers. What should I know about preventing infection? Hepatitis B  If you have a higher risk for hepatitis B, you should be screened for this virus. Talk with your health care provider to find out if you are at risk for hepatitis B infection. Hepatitis C  Blood testing is recommended for:  Everyone born from [birth year] through 1965.  Anyone with known risk factors for hepatitis C. Sexually Transmitted Diseases (STDs)  You should be screened each year for STDs including gonorrhea and chlamydia if:  You are sexually active and are younger than 71 years of age.  You are older than 72 years of age and your health care provider tells you that you  are at risk for this type of infection.  Your sexual activity has changed since you were last screened and you are at an increased risk for chlamydia or gonorrhea. Ask your health care provider if you are at risk.  Talk with your health care provider about whether you are at high risk of being infected with HIV. Your health care provider may recommend a prescription medicine to help prevent HIV infection. What else can I do?  Schedule regular health, dental, and eye exams.  Stay current with your vaccines (immunizations).  Do not use any tobacco products, such as cigarettes, chewing tobacco, and e-cigarettes. If you need help quitting, ask your health care provider.  Limit alcohol intake to no more than 2 drinks per day. One drink equals 12 ounces of beer, 5 ounces of wine, or 1 ounces of hard liquor.  Do not use street drugs.  Do not share needles.  Ask your health care provider for help if you need support or information about quitting drugs.  Tell your health care provider if you often feel depressed.  Tell your health care provider if you have ever been abused or do not feel safe at home. This information is not intended to replace advice given to  you by your health care provider. Make sure you discuss any questions you have with your health care provider. Document Released: 04/25/2008 Document Revised: 06/26/2016 Document Reviewed: 08/01/2015 Elsevier Interactive Patient Education  2017 [employer].

## 2017-02-26 ENCOUNTER — Telehealth: Payer: Self-pay

## 2017-02-26 NOTE — Telephone Encounter (Signed)
Call to the home and left the below message. Will try to outreach again if he does not call back   For travel medicine Manuela Schwartz- can you give him this info please? He can be seen at any time and they will tell him when he needs immunizations.  [facility] [phone] (cheaper per one of my patients-) -Also have [facility] [phone]

## 2017-02-27 ENCOUNTER — Ambulatory Visit: Payer: Self-pay

## 2017-02-27 ENCOUNTER — Ambulatory Visit (INDEPENDENT_AMBULATORY_CARE_PROVIDER_SITE_OTHER): Payer: 59 | Admitting: Psychology

## 2017-02-27 DIAGNOSIS — F4321 Adjustment disorder with depressed mood: Secondary | ICD-10-CM | POA: Diagnosis not present

## 2017-02-28 NOTE — Telephone Encounter (Signed)
2nd outreach regarding fup for travel vaccinations. Left my direct line to call back if needed or if MV was unclear

## 2017-03-06 NOTE — Telephone Encounter (Signed)
Call to the patient and he has been on vacation but he did get his message regarding his vaccinations

## 2017-03-19 ENCOUNTER — Ambulatory Visit (HOSPITAL_COMMUNITY): Payer: Medicare Other | Attending: Cardiovascular Disease

## 2017-03-19 ENCOUNTER — Other Ambulatory Visit: Payer: Self-pay

## 2017-03-19 DIAGNOSIS — Z953 Presence of xenogenic heart valve: Secondary | ICD-10-CM | POA: Diagnosis not present

## 2017-03-19 DIAGNOSIS — E785 Hyperlipidemia, unspecified: Secondary | ICD-10-CM | POA: Diagnosis not present

## 2017-03-19 DIAGNOSIS — I480 Paroxysmal atrial fibrillation: Secondary | ICD-10-CM | POA: Insufficient documentation

## 2017-03-19 DIAGNOSIS — I10 Essential (primary) hypertension: Secondary | ICD-10-CM | POA: Insufficient documentation

## 2017-03-19 DIAGNOSIS — I081 Rheumatic disorders of both mitral and tricuspid valves: Secondary | ICD-10-CM | POA: Insufficient documentation

## 2017-03-19 DIAGNOSIS — I351 Nonrheumatic aortic (valve) insufficiency: Secondary | ICD-10-CM

## 2017-03-19 DIAGNOSIS — Z952 Presence of prosthetic heart valve: Secondary | ICD-10-CM

## 2017-03-25 ENCOUNTER — Ambulatory Visit (INDEPENDENT_AMBULATORY_CARE_PROVIDER_SITE_OTHER): Payer: Medicare Other | Admitting: Family Medicine

## 2017-03-25 ENCOUNTER — Encounter: Payer: Self-pay | Admitting: Family Medicine

## 2017-03-25 VITALS — BP 126/78 | HR 65 | Temp 98.5°F | Ht 70.0 in | Wt 202.8 lb

## 2017-03-25 DIAGNOSIS — R590 Localized enlarged lymph nodes: Secondary | ICD-10-CM | POA: Diagnosis not present

## 2017-03-25 DIAGNOSIS — I10 Essential (primary) hypertension: Secondary | ICD-10-CM

## 2017-03-25 DIAGNOSIS — C9190 Lymphoid leukemia, unspecified not having achieved remission: Secondary | ICD-10-CM | POA: Diagnosis not present

## 2017-03-25 DIAGNOSIS — C9110 Chronic lymphocytic leukemia of B-cell type not having achieved remission: Secondary | ICD-10-CM

## 2017-03-25 LAB — CBC WITH DIFFERENTIAL/PLATELET
Basophils Absolute: 0 10*3/uL (ref 0.0–0.1)
Basophils Relative: 0.2 % (ref 0.0–3.0)
Eosinophils Absolute: 0.1 10*3/uL (ref 0.0–0.7)
Eosinophils Relative: 0.4 % (ref 0.0–5.0)
HCT: 44.1 % (ref 39.0–52.0)
Hemoglobin: 14.8 g/dL (ref 13.0–17.0)
Lymphocytes Relative: 89.5 % — ABNORMAL HIGH (ref 12.0–46.0)
Lymphs Abs: 13.8 10*3/uL — ABNORMAL HIGH (ref 0.7–4.0)
MCHC: 33.5 g/dL (ref 30.0–36.0)
MCV: 97.7 fl (ref 78.0–100.0)
Monocytes Absolute: 0.3 10*3/uL (ref 0.1–1.0)
Monocytes Relative: 1.9 % — ABNORMAL LOW (ref 3.0–12.0)
Neutro Abs: 1.2 10*3/uL — ABNORMAL LOW (ref 1.4–7.7)
Neutrophils Relative %: 8 % — ABNORMAL LOW (ref 43.0–77.0)
Platelets: 156 10*3/uL (ref 150.0–400.0)
RBC: 4.52 Mil/uL (ref 4.22–5.81)
RDW: 13.8 % (ref 11.5–15.5)
WBC: 15.4 10*3/uL — ABNORMAL HIGH (ref 4.0–10.5)

## 2017-03-25 LAB — COMPREHENSIVE METABOLIC PANEL
ALT: 17 U/L (ref 0–53)
AST: 16 U/L (ref 0–37)
Albumin: 4.7 g/dL (ref 3.5–5.2)
Alkaline Phosphatase: 48 U/L (ref 39–117)
BUN: 15 mg/dL (ref 6–23)
CO2: 31 mEq/L (ref 19–32)
Calcium: 9.6 mg/dL (ref 8.4–10.5)
Chloride: 104 mEq/L (ref 96–112)
Creatinine, Ser: 1.41 mg/dL (ref 0.40–1.50)
GFR: 63.71 mL/min (ref >60.00)
Glucose, Bld: 90 mg/dL (ref 70–99)
Potassium: 4.5 mEq/L (ref 3.5–5.1)
Sodium: 140 mEq/L (ref 135–145)
Total Bilirubin: 0.4 mg/dL (ref 0.2–1.2)
Total Protein: 6.8 g/dL (ref 6.0–8.3)

## 2017-03-25 NOTE — Assessment & Plan Note (Signed)
Trial half tablet instead of full tablet with AM dizziness after taking this

## 2017-03-25 NOTE — Patient Instructions (Signed)
Please stop by lab before you go  [name] says hello and guided me on adding some labs. He is going to try to get your appointment moved up to next week with him instead of seeing you in June.

## 2017-03-25 NOTE — Progress Notes (Signed)
Subjective:  [name] is a 71 y.o. year old very pleasant male patient who presents for/with See problem oriented charting ROS- no fever, chills, nausea, vomiting, unintentional weight loss. No night sweats. Does admit to some fatigue and lightheadedness   Past Medical History-  Patient Active Problem List   Diagnosis Date Noted  . S/P Bioprosthetic AVR (aortic valve replacement) in 2011 03/22/2015    Priority: High  . Recurrent pulmonary embolism ([place]) 11/09/2014    Priority: High  . Chronic lymphocytic leukemia ([place]) 11/11/2013    Priority: High  . Atrial fibrillation ([place]) 04/20/2007    Priority: High  . PROSTATE CANCER, HX OF 04/20/2007    Priority: High  . Cervical spondylosis     Priority: Medium  . Migraine 10/08/2010    Priority: Medium  . Insomnia 02/03/2008    Priority: Medium  . Anxiety state 09/06/2007    Priority: Medium  . HLD (hyperlipidemia) 04/20/2007    Priority: Medium  . Essential hypertension 04/20/2007    Priority: Medium  . Lung nodule 11/02/2014    Priority: Low  . GERD 12/11/2009    Priority: Low  . MITRAL REGURGITATION 04/07/2009    Priority: Low  . Former smoker 08/29/2016    Medications- reviewed and updated Current Outpatient Prescriptions  Medication Sig Dispense Refill  . amoxicillin-clavulanate (AUGMENTIN) 875-125 MG tablet Take 1 tablet by mouth 2 (two) times daily. 14 tablet 0  . diclofenac sodium (VOLTAREN) 1 % GEL Apply 2 g topically as needed.    Marland Kitchen FLUoxetine (PROZAC) 10 MG capsule TAKE 1 CAPSULE (10 MG TOTAL) BY MOUTH DAILY. 90 capsule 3  . imipramine (TOFRANIL) 10 MG tablet Take 1 tablet (10 mg total) by mouth 2 (two) times daily. (Patient taking differently: Take 10 mg by mouth at bedtime. ) 90 tablet 3  . LORazepam (ATIVAN) 0.5 MG tablet TAKE 1 TABLET BY MOUTH EVERY DAY AS NEEDED FOR ANXIETY 30 tablet 5  . metoprolol succinate (TOPROL-XL) 25 MG 24 hr tablet TAKE 1 TABLET BY MOUTH EVERY DAY 30 tablet 8  . rivaroxaban  (XARELTO) 20 MG TABS tablet Take 1 tablet (20 mg total) by mouth daily with supper. 90 tablet 1  . rosuvastatin (CRESTOR) 10 MG tablet Take 1 tablet by mouth every Monday, Wednesday, and Friday. 45 tablet 3   No current facility-administered medications for this visit.     Objective: BP 126/78 (BP Location: Left Arm, Patient Position: Sitting, Cuff Size: Large)   Pulse 65   Temp 98.5 F (36.9 C) (Oral)   Ht 5\' 10"  (1.778 m)   Wt 202 lb 12.8 oz (92 kg)   SpO2 97%   BMI 29.10 kg/m  Gen: NAD, resting comfortably Nares normal, TM normal, orophaarynx normal Patient with multiple areas of lymphadenopathy in bilateral supraclavicular, anterior cervical, submandibular, posterior cervical chain. Submandibular, and bilateral supraclavicular each with a lymph node at least 1 cm in size CV: RRR no murmurs rubs or gallops No axillary or goin lymphadenopathy Lungs: CTAB no crackles, wheeze, rhonchi Abdomen: soft/nontender/nondistended/normal bowel sounds. No rebound or guarding.  Ext: no edema Skin: warm, dry, no rash or signs of skin infection  Results for orders placed or performed in visit on 03/25/17 (from the past 24 hour(s))  CBC with Differential/Platelet     Status: Abnormal   Collection Time: 03/25/17 12:34 PM  Result Value Ref Range   WBC 15.4 (H) 4.0 - 10.5 K/uL   RBC 4.52 4.22 - 5.81 Mil/uL   Hemoglobin 14.8  13.0 - 17.0 g/dL   HCT 44.1 39.0 - 52.0 %   MCV 97.7 78.0 - 100.0 fl   MCHC 33.5 30.0 - 36.0 g/dL   RDW 13.8 11.5 - 15.5 %   Platelets 156.0 150.0 - 400.0 K/uL   Neutrophils Relative % 8.0 (L) 43.0 - 77.0 %   Lymphocytes Relative 89.5 Repeated and verified X2. (H) 12.0 - 46.0 %   Monocytes Relative 1.9 (L) 3.0 - 12.0 %   Eosinophils Relative 0.4 0.0 - 5.0 %   Basophils Relative 0.2 0.0 - 3.0 %   Neutro Abs 1.2 (L) 1.4 - 7.7 K/uL   Lymphs Abs 13.8 (H) 0.7 - 4.0 K/uL   Monocytes Absolute 0.3 0.1 - 1.0 K/uL   Eosinophils Absolute 0.1 0.0 - 0.7 K/uL   Basophils Absolute  0.0 0.0 - 0.1 K/uL  Comprehensive metabolic panel     Status: None   Collection Time: 03/25/17 12:34 PM  Result Value Ref Range   Sodium 140 135 - 145 mEq/L   Potassium 4.5 3.5 - 5.1 mEq/L   Chloride 104 96 - 112 mEq/L   CO2 31 19 - 32 mEq/L   Glucose, Bld 90 70 - 99 mg/dL   BUN 15 6 - 23 mg/dL   Creatinine, Ser 1.41 0.40 - 1.50 mg/dL   Total Bilirubin 0.4 0.2 - 1.2 mg/dL   Alkaline Phosphatase 48 39 - 117 U/L   AST 16 0 - 37 U/L   ALT 17 0 - 53 U/L   Total Protein 6.8 6.0 - 8.3 g/dL   Albumin 4.7 3.5 - 5.2 g/dL   Calcium 9.6 8.4 - 10.5 mg/dL   GFR 63.71 >60.00 mL/min    Assessment/Plan:  Supraclavicular lymphadenopathy (also submandibular, anterior cervical, posterior cervical) in setting of CLL - Plan: CBC with Differential/Platelet, Comprehensive metabolic panel, Lactate Dehydrogenase, Beta 2 microglobulin, serum S: Patient states he has been in his normal state of health until 1-2 weeks ago. He started to feel slightly lightheaded with AM meds- metoprolol and prozac. He felt better as the day went on though. He also felt slightly more tired. No fever,  chills, night sweast, weight loss (other than slight after a very active vacation).   Then about a week ago he could visibly see some enlargement of several lymph nodes in his neck- noted above collarbone, in neck, below jaw. Since noticing them does not think they have further enlarged. He has not felt ill other than the slight lightheadedness noted above A/P: No obvious infectious source today. Spoke with [name] with concern this could  Be malignancy related- he was gracious enough to move follow up to next week with him (office staff will reach out to Mr. Daws with date). Had planned CBC and DMP but he also suggested LDH and beta 2 microglobulin- these were added to labs today. Initial labs appear largely stable.   I did call him after visit and advise him to trial half a metoprolol in the AM instead of full considering  lightheadedness symptoms though I do not strongly suspect medications are the cause here.  Essential hypertension Trial half tablet instead of full tablet with AM dizziness after taking this  1 week hematology follow up  Orders Placed This Encounter  Procedures  . CBC with Differential/Platelet  . Comprehensive metabolic panel    [place]  . Lactate Dehydrogenase  . Beta 2 microglobulin, serum   Return precautions advised.  Garret Reddish, MD

## 2017-03-26 ENCOUNTER — Telehealth: Payer: Self-pay | Admitting: Family Medicine

## 2017-03-26 LAB — LACTATE DEHYDROGENASE: LDH: 249 U/L (ref 120–250)

## 2017-03-26 NOTE — Telephone Encounter (Signed)
[name] is returning your call

## 2017-03-27 ENCOUNTER — Telehealth: Payer: Self-pay | Admitting: Family Medicine

## 2017-03-27 ENCOUNTER — Ambulatory Visit (INDEPENDENT_AMBULATORY_CARE_PROVIDER_SITE_OTHER): Payer: 59 | Admitting: Psychology

## 2017-03-27 DIAGNOSIS — F4321 Adjustment disorder with depressed mood: Secondary | ICD-10-CM | POA: Diagnosis not present

## 2017-03-27 NOTE — Telephone Encounter (Signed)
Pt would like a call back to elaborate on the two outstanding lab results (Beta2 and Lactate) once they come in.  Pt is aware the protocol is that someone from the office will give him a call once the results are back in and has been read by the MD pt verbalized understanding.

## 2017-03-27 NOTE — Telephone Encounter (Signed)
Spoke with patient this morning and reviewed over lab results

## 2017-03-28 LAB — BETA 2 MICROGLOBULIN, SERUM: Beta-2 Microglobulin: 2.69 mg/L — ABNORMAL HIGH (ref <=2.51)

## 2017-03-31 ENCOUNTER — Other Ambulatory Visit: Payer: Self-pay | Admitting: Hematology & Oncology

## 2017-03-31 DIAGNOSIS — C9110 Chronic lymphocytic leukemia of B-cell type not having achieved remission: Secondary | ICD-10-CM

## 2017-04-01 NOTE — Telephone Encounter (Signed)
Spoke with patient who states he got a call from Dr. Antonieta Pert office to schedule him for a visit to their office. He states that he called the other day to ask about a colonoscopy. He felt like his message was getting jumbled. He states he only has a BM about twice a week. He always uses [facility] so he feels it is a result of this.

## 2017-04-01 NOTE — Telephone Encounter (Signed)
I think the focus for now should be on oncology visit.   He is up to date with colonoscopy with one in 2014 it appears and they advised 10 year follow up. Is he interested in colonoscopy because he is on more constipated side? Is he taking anything like benefiber or colace to help with bowel movements?

## 2017-04-02 ENCOUNTER — Other Ambulatory Visit (HOSPITAL_BASED_OUTPATIENT_CLINIC_OR_DEPARTMENT_OTHER): Payer: Medicare Other

## 2017-04-02 ENCOUNTER — Ambulatory Visit (HOSPITAL_BASED_OUTPATIENT_CLINIC_OR_DEPARTMENT_OTHER): Payer: Medicare Other | Admitting: Hematology & Oncology

## 2017-04-02 VITALS — BP 127/76 | HR 82 | Temp 98.6°F | Resp 18 | Wt 205.4 lb

## 2017-04-02 DIAGNOSIS — I2699 Other pulmonary embolism without acute cor pulmonale: Secondary | ICD-10-CM

## 2017-04-02 DIAGNOSIS — Z7901 Long term (current) use of anticoagulants: Secondary | ICD-10-CM | POA: Diagnosis not present

## 2017-04-02 DIAGNOSIS — R911 Solitary pulmonary nodule: Secondary | ICD-10-CM | POA: Diagnosis not present

## 2017-04-02 DIAGNOSIS — C9110 Chronic lymphocytic leukemia of B-cell type not having achieved remission: Secondary | ICD-10-CM | POA: Diagnosis not present

## 2017-04-02 LAB — MANUAL DIFFERENTIAL (CHCC SATELLITE)
ALC: 15.4 10*3/uL — ABNORMAL HIGH (ref 0.9–3.3)
ANC (CHCC HP manual diff): 2.4 10*3/uL (ref 1.5–6.5)
BASO: 1 % (ref 0–2)
LYMPH: 85 % — ABNORMAL HIGH (ref 14–48)
MONO: 1 % (ref 0–13)
Myelocytes: 1 % — ABNORMAL HIGH (ref 0–0)
PLT EST ~~LOC~~: DECREASED
Platelet Morphology: NORMAL
RBC Comments: NORMAL
SEG: 12 % — ABNORMAL LOW (ref 40–75)

## 2017-04-02 LAB — CBC WITH DIFFERENTIAL (CANCER CENTER ONLY)
HCT: 44.1 % (ref 38.7–49.9)
HGB: 14.7 g/dL (ref 13.0–17.1)
MCH: 32.3 pg (ref 28.0–33.4)
MCHC: 33.3 g/dL (ref 32.0–35.9)
MCV: 97 fL (ref 82–98)
Platelets: 133 10*3/uL — ABNORMAL LOW (ref 145–400)
RBC: 4.55 10*6/uL (ref 4.20–5.70)
RDW: 12.7 % (ref 11.1–15.7)
WBC: 18.1 10*3/uL — ABNORMAL HIGH (ref 4.0–10.0)

## 2017-04-02 LAB — CHCC SATELLITE - SMEAR

## 2017-04-02 NOTE — Progress Notes (Signed)
Hematology and Oncology Follow Up Visit  JCEON ALVERIO [id number] 03-Jan-1946 71 y.o. 04/02/2017   Principle Diagnosis:   Right lower lobe pulmonary nodule- non-malignant  Recurrent pulmonary embolism  CLL-stable  Current Therapy:    Xarelto 20 mg by mouth daily     Interim History:  Mr.  Scobie is back for an early follow-up. He sees Dr. Yong Channel. He likes Dr. Yong Channel. Dr. Yong Channel that we know that there were some potential changes on his exam with respect to his CLL.  Mr. Ledgerwood is feeling okay. He's noticed some occasional lymph nodes in his neck which seemed to "come and go.". He has had no fever. He's had no infections. He's had no bleeding.  He is on Xarelto. He enjoys being on Xarelto. This is much more convenient for him then Coumadin.   He's had no rashes. He's had no change in bowel or bladder habits. He's had no cough. He's had no shortness of breath. He's had no chest wall pain.   There's been no leg swelling.  His appetite is good. He's had no weight loss or weight gain.  He does have the prosthetic aortic valve. He's done well with this. He recently had an echocardiogram. His ejection fraction was 55-60%. His positive valve looked well. His ventricular cavities looked of normal thickness. Pulmonary arteries looked okay with normal pressure gradients.   Overall, his performance status is ECOG 0.  Medications:  Current Outpatient Prescriptions:  .  amoxicillin-clavulanate (AUGMENTIN) 875-125 MG tablet, Take 1 tablet by mouth 2 (two) times daily., Disp: 14 tablet, Rfl: 0 .  diclofenac sodium (VOLTAREN) 1 % GEL, Apply 2 g topically as needed., Disp: , Rfl:  .  FLUoxetine (PROZAC) 10 MG capsule, TAKE 1 CAPSULE (10 MG TOTAL) BY MOUTH DAILY., Disp: 90 capsule, Rfl: 3 .  imipramine (TOFRANIL) 10 MG tablet, Take 1 tablet (10 mg total) by mouth 2 (two) times daily. (Patient taking differently: Take 10 mg by mouth at bedtime. ), Disp: 90 tablet, Rfl: 3 .  LORazepam (ATIVAN) 0.5  MG tablet, TAKE 1 TABLET BY MOUTH EVERY DAY AS NEEDED FOR ANXIETY, Disp: 30 tablet, Rfl: 5 .  metoprolol succinate (TOPROL-XL) 25 MG 24 hr tablet, TAKE 1 TABLET BY MOUTH EVERY DAY (Patient taking differently: TAKE 1/2 TABLET BY MOUTH EVERY DAY), Disp: 30 tablet, Rfl: 8 .  rivaroxaban (XARELTO) 20 MG TABS tablet, Take 1 tablet (20 mg total) by mouth daily with supper., Disp: 90 tablet, Rfl: 1 .  rosuvastatin (CRESTOR) 10 MG tablet, Take 1 tablet by mouth every Monday, Wednesday, and Friday., Disp: 45 tablet, Rfl: 3  Allergies:  Allergies  Allergen Reactions  . Hydrocodone-Acetaminophen Shortness Of Breath  . Oxycodone Hcl Shortness Of Breath  . Telmisartan-Hctz Shortness Of Breath and Palpitations    Dizziness, too  . Ramipril   . Aspirin Other (See Comments) and Rash    Other Reaction: confusion Can't take high dose  . Atorvastatin Other (See Comments)    myalgia  . Buspirone Hcl Other (See Comments)    headache  . Codeine Nausea Only and Other (See Comments)    Other Reaction: confusion  . Codeine Phosphate Itching and Nausea Only  . Hydrocodone-Acetaminophen Nausea Only and Other (See Comments)    Other Reaction: confusion  . Morphine Sulfate Itching  . Paroxetine Other (See Comments)    Paresthesias: R arm, R leg, r side of face  . Rabeprazole Other (See Comments)    headache    Past Medical  History, Surgical history, Social history, and Family History were reviewed and updated.  Review of Systems: As above  Physical Exam:  weight is 205 lb 6.4 oz (93.2 kg). His oral temperature is 98.6 F (37 C). His blood pressure is 127/76 and his pulse is 82. His respiration is 18 and oxygen saturation is 99%.   Well-developed and well-nourished African-American gentleman. There are no ocular or oral lesions. He has a few palpable cervical or supraclavicular lymph nodes. Most of these are in the posterior cervical chain. They are mobile and less than 5 mm. Lungs are clear. Has a rales,  wheezes or rhonchi. Cardiac exam regular rate and rhythm with no murmurs, rubs or bruits. Axillary exam that show a right axillary lymph node that measures about 1.0 cm. This is mobile and nontender. There is Mild left axillary adenopathy. No lymph node measures more than 1 cm. Abdomen is soft. Has good bowel sounds. There is no fluid wave. There is no palpable liver or spleen tip. Back exam shows a well-healed thoracoscopy scar in the right lateral chest wall. Where he had the chest tube in place just inferior to the thoracoscopy scar is also healing nicely. There is no erythema or warmth over the thoracoscopy site. no tenderness over the spine, ribs or hips. Extremities shows no clubbing, cyanosis or edema. No venous cord is palpable is legs. He has a negative Homans sign. Skin exam shows no rashes, ecchymoses or petechia. Neurological exam is nonfocal.  Lab Results  Component Value Date   WBC 18.1 (H) 04/02/2017   HGB 14.7 04/02/2017   HCT 44.1 04/02/2017   MCV 97 04/02/2017   PLT 133 (L) 04/02/2017     Chemistry      Component Value Date/Time   NA 140 03/25/2017 1234   NA 139 06/27/2016 1410   K 4.5 03/25/2017 1234   K 5.1 06/27/2016 1410   CL 104 03/25/2017 1234   CL 107 02/06/2015 1421   CO2 31 03/25/2017 1234   CO2 28 06/27/2016 1410   BUN 15 03/25/2017 1234   BUN 20.9 06/27/2016 1410   CREATININE 1.41 03/25/2017 1234   CREATININE 1.6 (H) 06/27/2016 1410      Component Value Date/Time   CALCIUM 9.6 03/25/2017 1234   CALCIUM 9.6 06/27/2016 1410   ALKPHOS 48 03/25/2017 1234   ALKPHOS 62 06/27/2016 1410   AST 16 03/25/2017 1234   AST 18 06/27/2016 1410   ALT 17 03/25/2017 1234   ALT 24 06/27/2016 1410   BILITOT 0.4 03/25/2017 1234   BILITOT 0.46 06/27/2016 1410         Impression and Plan: Mr. Fitzwater is 71 year-old African-American gentleman. He has recurrent pulmonary embolism. I think he needs lifelong anticoagulation.He is doing very well on Xarelto. He enjoys being  on Xarelto.  I do not see any problems with his CLL. His lymph nodes might be slightly more pronounced. However, he is totally asymptomatic. His white cell count really has gone up minimally. His platelet count has dropped just a little bit but he is not at risk for bleeding, even though he is on Xarelto.  I will plan to get him back in late August. He is going to [name] specifically [place], in September. I want to make sure his blood counts and physical exam were okay before he goes over to [name].  Thanks Volanda Napoleon, MD 5/23/20181:38 PM

## 2017-04-05 ENCOUNTER — Other Ambulatory Visit: Payer: Self-pay | Admitting: Family Medicine

## 2017-04-09 NOTE — Telephone Encounter (Signed)
Spoke with patient who voiced understanding regarding colonoscopy. He is using Sen[place] currently for his bowel regimen

## 2017-04-10 NOTE — Telephone Encounter (Signed)
Roselyn Reef- does he need anything else at this point?

## 2017-04-11 NOTE — Telephone Encounter (Signed)
No. He knows the oncology appointment is the priority. He will see what they say and go from there

## 2017-04-21 ENCOUNTER — Ambulatory Visit (HOSPITAL_BASED_OUTPATIENT_CLINIC_OR_DEPARTMENT_OTHER): Payer: Medicare Other | Admitting: Hematology & Oncology

## 2017-04-21 ENCOUNTER — Telehealth: Payer: Self-pay | Admitting: *Deleted

## 2017-04-21 ENCOUNTER — Ambulatory Visit (HOSPITAL_BASED_OUTPATIENT_CLINIC_OR_DEPARTMENT_OTHER): Payer: Medicare Other

## 2017-04-21 VITALS — BP 112/81 | HR 77 | Temp 98.4°F | Resp 16 | Wt 204.0 lb

## 2017-04-21 DIAGNOSIS — R911 Solitary pulmonary nodule: Secondary | ICD-10-CM

## 2017-04-21 DIAGNOSIS — I480 Paroxysmal atrial fibrillation: Secondary | ICD-10-CM

## 2017-04-21 DIAGNOSIS — C9110 Chronic lymphocytic leukemia of B-cell type not having achieved remission: Secondary | ICD-10-CM

## 2017-04-21 DIAGNOSIS — R590 Localized enlarged lymph nodes: Secondary | ICD-10-CM

## 2017-04-21 DIAGNOSIS — I2699 Other pulmonary embolism without acute cor pulmonale: Secondary | ICD-10-CM | POA: Diagnosis not present

## 2017-04-21 DIAGNOSIS — Z7901 Long term (current) use of anticoagulants: Secondary | ICD-10-CM | POA: Diagnosis not present

## 2017-04-21 LAB — CBC WITH DIFFERENTIAL (CANCER CENTER ONLY)
HCT: 44.9 % (ref 38.7–49.9)
HGB: 15.1 g/dL (ref 13.0–17.1)
MCH: 32.8 pg (ref 28.0–33.4)
MCHC: 33.6 g/dL (ref 32.0–35.9)
MCV: 97 fL (ref 82–98)
Platelets: 141 10*3/uL — ABNORMAL LOW (ref 145–400)
RBC: 4.61 10*6/uL (ref 4.20–5.70)
RDW: 13.1 % (ref 11.1–15.7)
WBC: 18.8 10*3/uL — ABNORMAL HIGH (ref 4.0–10.0)

## 2017-04-21 LAB — MANUAL DIFFERENTIAL (CHCC SATELLITE)
ALC: 15.8 10*3/uL — ABNORMAL HIGH (ref 0.9–3.3)
ANC (CHCC HP manual diff): 2.3 10*3/uL (ref 1.5–6.5)
LYMPH: 84 % — ABNORMAL HIGH (ref 14–48)
MONO: 4 % (ref 0–13)
PLT EST ~~LOC~~: DECREASED
RBC Comments: NORMAL
SEG: 12 % — ABNORMAL LOW (ref 40–75)

## 2017-04-21 LAB — CMP (CANCER CENTER ONLY)
ALT(SGPT): 34 U/L (ref 10–47)
AST: 25 U/L (ref 11–38)
Albumin: 4.1 g/dL (ref 3.3–5.5)
Alkaline Phosphatase: 48 U/L (ref 26–84)
BUN, Bld: 14 mg/dL (ref 7–22)
CO2: 30 mEq/L (ref 18–33)
Calcium: 9.6 mg/dL (ref 8.0–10.3)
Chloride: 105 mEq/L (ref 98–108)
Creat: 1.3 mg/dl — ABNORMAL HIGH (ref 0.6–1.2)
Glucose, Bld: 90 mg/dL (ref 73–118)
Potassium: 4.7 mEq/L (ref 3.3–4.7)
Sodium: 141 mEq/L (ref 128–145)
Total Bilirubin: 0.8 mg/dl (ref 0.20–1.60)
Total Protein: 7.1 g/dL (ref 6.4–8.1)

## 2017-04-21 LAB — CHCC SATELLITE - SMEAR

## 2017-04-21 LAB — LACTATE DEHYDROGENASE: LDH: 322 U/L — ABNORMAL HIGH (ref 125–245)

## 2017-04-21 NOTE — Telephone Encounter (Signed)
Patient c/o enlarged lymph nodes. He states they have grown and are now the size of "plums"  Reviewed with Dr [name] and he wants patient to be seen today. Appointment made and patient aware.

## 2017-04-21 NOTE — Progress Notes (Signed)
Hematology and Oncology Follow Up Visit  [name] [facility] [id number] 07/20/1946 71 y.o. 04/21/2017   Principle Diagnosis:   Right lower lobe pulmonary nodule- non-malignant  Recurrent pulmonary embolism  CLL-stable  Current Therapy:    Xarelto 20 mg by mouth daily     Interim History:  Mr.  Bell is back for an early follow-up.he's been complaining of swollen cervical lymph nodes. He says that the submandibular lymph nodes have gotten quite large. He says a mostly get enlarged in the evening. Was on today, the lymph nodes were somewhat enlarged but he said that they were actually better.  He has had no fever. He's had no sweats. He's had no dysphagia or odynophagia. He says that he's had a little bit of hoarseness.  He has had no change in bowel or bladder habits. He has had no rashes. There has been no leg swelling.  Low worrisome most is the fact that he is planning to go on a safari in [name] in September/October. He really wants to be in good health for this.  Overall, his performance status is ECOG 0.  Medications:  Current Outpatient Prescriptions:  .  amoxicillin-clavulanate (AUGMENTIN) 875-125 MG tablet, Take 1 tablet by mouth 2 (two) times daily., Disp: 14 tablet, Rfl: 0 .  diclofenac sodium (VOLTAREN) 1 % GEL, Apply 2 g topically as needed., Disp: , Rfl:  .  FLUoxetine (PROZAC) 10 MG capsule, TAKE 1 CAPSULE (10 MG TOTAL) BY MOUTH DAILY., Disp: 90 capsule, Rfl: 3 .  imipramine (TOFRANIL) 10 MG tablet, Take 1 tablet (10 mg total) by mouth 2 (two) times daily. (Patient taking differently: Take 10 mg by mouth at bedtime. ), Disp: 90 tablet, Rfl: 3 .  imipramine (TOFRANIL) 10 MG tablet, TAKE 1 TABLET (10 MG TOTAL) BY MOUTH AT BEDTIME., Disp: 90 tablet, Rfl: 0 .  LORazepam (ATIVAN) 0.5 MG tablet, TAKE 1 TABLET BY MOUTH EVERY DAY AS NEEDED FOR ANXIETY, Disp: 30 tablet, Rfl: 5 .  metoprolol succinate (TOPROL-XL) 25 MG 24 hr tablet, TAKE 1 TABLET BY MOUTH EVERY DAY (Patient taking  differently: TAKE 1/2 TABLET BY MOUTH EVERY DAY), Disp: 30 tablet, Rfl: 8 .  rivaroxaban (XARELTO) 20 MG TABS tablet, Take 1 tablet (20 mg total) by mouth daily with supper., Disp: 90 tablet, Rfl: 1 .  rosuvastatin (CRESTOR) 10 MG tablet, Take 1 tablet by mouth every Monday, Wednesday, and Friday., Disp: 45 tablet, Rfl: 3  Allergies:  Allergies  Allergen Reactions  . Hydrocodone-Acetaminophen Shortness Of Breath  . Oxycodone Hcl Shortness Of Breath  . Telmisartan-Hctz Shortness Of Breath and Palpitations    Dizziness, too  . Ramipril   . Aspirin Other (See Comments) and Rash    Other Reaction: confusion Can't take high dose  . Atorvastatin Other (See Comments)    myalgia  . Buspirone Hcl Other (See Comments)    headache  . Codeine Nausea Only and Other (See Comments)    Other Reaction: confusion  . Codeine Phosphate Itching and Nausea Only  . Hydrocodone-Acetaminophen Nausea Only and Other (See Comments)    Other Reaction: confusion  . Morphine Sulfate Itching  . Paroxetine Other (See Comments)    Paresthesias: R arm, R leg, r side of face  . Rabeprazole Other (See Comments)    headache    Past Medical History, Surgical history, Social history, and Family History were reviewed and updated.  Review of Systems: As above  Physical Exam:  weight is 204 lb (92.5 kg). His oral temperature  is 98.4 F (36.9 C). His blood pressure is 112/81 and his pulse is 77. His respiration is 16 and oxygen saturation is 100%.   Well-developed and well-nourished African-American gentleman. There are no ocular or oral lesions. He has a few palpable cervical or supraclavicular lymph nodes. The submandibular lymph nodes are more prominent than when we saw him 2 weeks ago. I would say probably measure 1-1.5 cm. Most of the posterior cervical chain are mobile and less than 5 mm. Lungs are clear. Has a rales, wheezes or rhonchi. Cardiac exam regular rate and rhythm with no murmurs, rubs or bruits.  Axillary exam that show a right axillary lymph node that measures about 1.0 cm. This is mobile and nontender. There is mild left axillary adenopathy. No lymph node measures more than 1 cm. Abdomen is soft. Has good bowel sounds. There is no fluid wave. There is no palpable liver or spleen tip. Back exam shows a well-healed thoracoscopy scar in the right lateral chest wall. Where he had the chest tube in place just inferior to the thoracoscopy scar is also healing nicely. There is no erythema or warmth over the thoracoscopy site. no tenderness over the spine, ribs or hips. Extremities shows no clubbing, cyanosis or edema. No venous cord is palpable is legs. He has a negative Homans sign. Skin exam shows no rashes, ecchymoses or petechia. Neurological exam is nonfocal.  Lab Results  Component Value Date   WBC 18.8 (H) 04/21/2017   HGB 15.1 04/21/2017   HCT 44.9 04/21/2017   MCV 97 04/21/2017   PLT 141 (L) 04/21/2017     Chemistry      Component Value Date/Time   NA 141 04/21/2017 1202   NA 139 06/27/2016 1410   K 4.7 04/21/2017 1202   K 5.1 06/27/2016 1410   CL 105 04/21/2017 1202   CO2 30 04/21/2017 1202   CO2 28 06/27/2016 1410   BUN 14 04/21/2017 1202   BUN 20.9 06/27/2016 1410   CREATININE 1.3 (H) 04/21/2017 1202   CREATININE 1.6 (H) 06/27/2016 1410      Component Value Date/Time   CALCIUM 9.6 04/21/2017 1202   CALCIUM 9.6 06/27/2016 1410   ALKPHOS 48 04/21/2017 1202   ALKPHOS 62 06/27/2016 1410   AST 25 04/21/2017 1202   AST 18 06/27/2016 1410   ALT 34 04/21/2017 1202   ALT 24 06/27/2016 1410   BILITOT 0.80 04/21/2017 1202   BILITOT 0.46 06/27/2016 1410         Impression and Plan: [name] is 71 year-old African-American gentleman. He has recurrent pulmonary embolism. I think he needs lifelong anticoagulation.He is doing very well on Xarelto. He enjoys being on Xarelto.  At this point, I think we are at a point where we have to do a CT scan of his neck and body. I  will like to see what is going on.  If we have to embark upon therapy, I think that he will do okay. I think that we should be all right for him to go to [name] in October.   We will get the scans done in a couple weeks. I will plan to see him back afterwards  I spent about 30 minutes with him today.  Volanda Napoleon, MD 6/11/20186:10 PM

## 2017-04-22 LAB — PROTEIN ELECTROPHORESIS, SERUM, WITH REFLEX
A/G Ratio: 1.6 (ref 0.7–1.7)
Albumin: 4.2 g/dL (ref 2.9–4.4)
Alpha 1: 0.2 g/dL (ref 0.0–0.4)
Alpha 2: 0.6 g/dL (ref 0.4–1.0)
Beta: 1.1 g/dL (ref 0.7–1.3)
Gamma Globulin: 0.9 g/dL (ref 0.4–1.8)
Globulin, Total: 2.7 g/dL (ref 2.2–3.9)
Total Protein: 6.9 g/dL (ref 6.0–8.5)

## 2017-04-22 LAB — IGG, IGA, IGM
IgA, Qn, Serum: 134 mg/dL (ref 61–437)
IgG, Qn, Serum: 768 mg/dL (ref 700–1600)
IgM, Qn, Serum: 115 mg/dL (ref 15–143)

## 2017-04-22 LAB — BETA 2 MICROGLOBULIN, SERUM: Beta-2: 2.1 mg/L (ref 0.6–2.4)

## 2017-04-23 ENCOUNTER — Ambulatory Visit (HOSPITAL_BASED_OUTPATIENT_CLINIC_OR_DEPARTMENT_OTHER)
Admission: RE | Admit: 2017-04-23 | Discharge: 2017-04-23 | Disposition: A | Discharge status: Home / Self Care | Payer: Medicare Other | Source: Ambulatory Visit | Attending: Hematology & Oncology | Admitting: Hematology & Oncology

## 2017-04-23 DIAGNOSIS — K429 Umbilical hernia without obstruction or gangrene: Secondary | ICD-10-CM | POA: Diagnosis not present

## 2017-04-23 DIAGNOSIS — R918 Other nonspecific abnormal finding of lung field: Secondary | ICD-10-CM | POA: Insufficient documentation

## 2017-04-23 DIAGNOSIS — R591 Generalized enlarged lymph nodes: Secondary | ICD-10-CM | POA: Diagnosis not present

## 2017-04-23 DIAGNOSIS — I700 Atherosclerosis of aorta: Secondary | ICD-10-CM | POA: Insufficient documentation

## 2017-04-23 DIAGNOSIS — C9110 Chronic lymphocytic leukemia of B-cell type not having achieved remission: Secondary | ICD-10-CM | POA: Diagnosis not present

## 2017-04-23 MED ORDER — IOPAMIDOL (ISOVUE-300) INJECTION 61%
100.0000 mL | Freq: Once | INTRAVENOUS | Status: AC | PRN
  Administered 2017-04-23: 100 mL via INTRAVENOUS

## 2017-04-28 ENCOUNTER — Encounter: Payer: Self-pay | Admitting: Hematology & Oncology

## 2017-04-29 ENCOUNTER — Encounter: Payer: Self-pay | Admitting: Neurology

## 2017-05-01 ENCOUNTER — Encounter: Payer: Self-pay | Admitting: Cardiovascular Disease

## 2017-05-06 ENCOUNTER — Ambulatory Visit (INDEPENDENT_AMBULATORY_CARE_PROVIDER_SITE_OTHER): Payer: 59 | Admitting: Psychology

## 2017-05-06 DIAGNOSIS — F4321 Adjustment disorder with depressed mood: Secondary | ICD-10-CM | POA: Diagnosis not present

## 2017-05-07 ENCOUNTER — Other Ambulatory Visit: Payer: Self-pay

## 2017-05-07 ENCOUNTER — Ambulatory Visit: Payer: Self-pay | Admitting: Hematology & Oncology

## 2017-05-09 ENCOUNTER — Encounter: Payer: Self-pay | Admitting: Family Medicine

## 2017-05-09 ENCOUNTER — Other Ambulatory Visit: Payer: Self-pay | Admitting: *Deleted

## 2017-05-09 DIAGNOSIS — C9110 Chronic lymphocytic leukemia of B-cell type not having achieved remission: Secondary | ICD-10-CM

## 2017-05-12 ENCOUNTER — Ambulatory Visit (HOSPITAL_BASED_OUTPATIENT_CLINIC_OR_DEPARTMENT_OTHER): Payer: Medicare Other | Admitting: Hematology & Oncology

## 2017-05-12 ENCOUNTER — Other Ambulatory Visit (HOSPITAL_BASED_OUTPATIENT_CLINIC_OR_DEPARTMENT_OTHER): Payer: Medicare Other

## 2017-05-12 VITALS — BP 144/90 | HR 70 | Temp 98.3°F | Resp 16 | Wt 206.0 lb

## 2017-05-12 DIAGNOSIS — I2699 Other pulmonary embolism without acute cor pulmonale: Secondary | ICD-10-CM | POA: Diagnosis not present

## 2017-05-12 DIAGNOSIS — C9110 Chronic lymphocytic leukemia of B-cell type not having achieved remission: Secondary | ICD-10-CM | POA: Diagnosis not present

## 2017-05-12 DIAGNOSIS — R911 Solitary pulmonary nodule: Secondary | ICD-10-CM

## 2017-05-12 DIAGNOSIS — R590 Localized enlarged lymph nodes: Secondary | ICD-10-CM | POA: Diagnosis not present

## 2017-05-12 DIAGNOSIS — Z7901 Long term (current) use of anticoagulants: Secondary | ICD-10-CM

## 2017-05-12 LAB — COMPREHENSIVE METABOLIC PANEL (CC13)
ALT: 22 IU/L (ref 0–44)
AST (SGOT): 19 IU/L (ref 0–40)
Albumin, Serum: 4.5 g/dL (ref 3.5–4.8)
Albumin/Globulin Ratio: 1.9 (ref 1.2–2.2)
Alkaline Phosphatase, S: 69 IU/L (ref 39–117)
BUN/Creatinine Ratio: 15 (ref 10–24)
BUN: 23 mg/dL (ref 8–27)
Bilirubin Total: 0.3 mg/dL (ref 0.0–1.2)
Calcium, Ser: 9.9 mg/dL (ref 8.6–10.2)
Carbon Dioxide, Total: 27 mmol/L (ref 20–29)
Chloride, Ser: 101 mmol/L (ref 96–106)
Creatinine, Ser: 1.54 mg/dL — ABNORMAL HIGH (ref 0.76–1.27)
GFR calc Af Amer: 52 mL/min/{1.73_m2} — ABNORMAL LOW (ref >59)
GFR calc non Af Amer: 45 mL/min/{1.73_m2} — ABNORMAL LOW (ref >59)
Globulin, Total: 2.4 g/dL (ref 1.5–4.5)
Glucose: 96 mg/dL (ref 65–99)
Potassium, Ser: 4.7 mmol/L (ref 3.5–5.2)
Sodium: 138 mmol/L (ref 134–144)
Total Protein: 6.9 g/dL (ref 6.0–8.5)

## 2017-05-12 LAB — MANUAL DIFFERENTIAL (CHCC SATELLITE)
ALC: 13.4 10*3/uL — ABNORMAL HIGH (ref 0.9–3.3)
ANC (CHCC HP manual diff): 2.8 10*3/uL (ref 1.5–6.5)
LYMPH: 76 % — ABNORMAL HIGH (ref 14–48)
MONO: 8 % (ref 0–13)
PLT EST ~~LOC~~: DECREASED
SEG: 16 % — ABNORMAL LOW (ref 40–75)

## 2017-05-12 LAB — CBC WITH DIFFERENTIAL (CANCER CENTER ONLY)
HCT: 42.6 % (ref 38.7–49.9)
HGB: 14.5 g/dL (ref 13.0–17.1)
MCH: 33 pg (ref 28.0–33.4)
MCHC: 34 g/dL (ref 32.0–35.9)
MCV: 97 fL (ref 82–98)
Platelets: 134 10*3/uL — ABNORMAL LOW (ref 145–400)
RBC: 4.4 10*6/uL (ref 4.20–5.70)
RDW: 13.1 % (ref 11.1–15.7)
WBC: 17.6 10*3/uL — ABNORMAL HIGH (ref 4.0–10.0)

## 2017-05-12 MED ORDER — MONTELUKAST SODIUM 10 MG PO TABS: 10.0000 mg | ORAL_TABLET | Freq: Every day | ORAL | 0 refills | Status: DC

## 2017-05-12 NOTE — Progress Notes (Signed)
Hematology and Oncology Follow Up Visit  [name] [id number] 11/16/1945 72 y.o. 05/12/2017   Principle Diagnosis:   Right lower lobe pulmonary nodule- non-malignant  Recurrent pulmonary embolism  Progressive  Current Therapy:    Xarelto 20 mg by mouth daily  Rituxan/bendamustine-cycle one to start on 05/21/2009     Interim History:  Mr.  Bell is back for an early follow-up. unfortunately, it looks like he is progressing. His blood counts have been dropping. He has more in the way palpable adenopathy.  We did go ahead and get a CT scan on him. He has bulky lymphadenopathy throughout his body. On his exam today it is obvious that his right cervical lymphadenopathy is worse.  The real problem that we have is that he is going on a safari in [place] in October. I need to make sure that he is not bothered by his CLL when he goes over.  He brought his niece in with him.  He is still in fantastic physical shape. He plays tennis. He exercises. He does yoga.  He clearly has adenopathy that is progressing.  He's had no fever. He's had no nausea or vomiting. His no cough. He has no shortness of breath. He has no leg swelling. He has no change in bowel or bladder habits.  Overall, his performance status is ECOG 0.  Medications:  Current Outpatient Prescriptions:  .  amoxicillin-clavulanate (AUGMENTIN) 875-125 MG tablet, Take 1 tablet by mouth 2 (two) times daily., Disp: 14 tablet, Rfl: 0 .  diclofenac sodium (VOLTAREN) 1 % GEL, Apply 2 g topically as needed., Disp: , Rfl:  .  FLUoxetine (PROZAC) 10 MG capsule, TAKE 1 CAPSULE (10 MG TOTAL) BY MOUTH DAILY., Disp: 90 capsule, Rfl: 3 .  imipramine (TOFRANIL) 10 MG tablet, Take 1 tablet (10 mg total) by mouth 2 (two) times daily. (Patient taking differently: Take 10 mg by mouth at bedtime. ), Disp: 90 tablet, Rfl: 3 .  imipramine (TOFRANIL) 10 MG tablet, TAKE 1 TABLET (10 MG TOTAL) BY MOUTH AT BEDTIME., Disp: 90 tablet, Rfl: 0 .   LORazepam (ATIVAN) 0.5 MG tablet, TAKE 1 TABLET BY MOUTH EVERY DAY AS NEEDED FOR ANXIETY, Disp: 30 tablet, Rfl: 5 .  metoprolol succinate (TOPROL-XL) 25 MG 24 hr tablet, TAKE 1 TABLET BY MOUTH EVERY DAY (Patient taking differently: TAKE 1/2 TABLET BY MOUTH EVERY DAY), Disp: 30 tablet, Rfl: 8 .  montelukast (SINGULAIR) 10 MG tablet, Take 1 tablet (10 mg total) by mouth at bedtime., Disp: 2 tablet, Rfl: 0 .  rivaroxaban (XARELTO) 20 MG TABS tablet, Take 1 tablet (20 mg total) by mouth daily with supper., Disp: 90 tablet, Rfl: 1 .  rosuvastatin (CRESTOR) 10 MG tablet, Take 1 tablet by mouth every Monday, Wednesday, and Friday., Disp: 45 tablet, Rfl: 3  Allergies:  Allergies  Allergen Reactions  . Hydrocodone-Acetaminophen Shortness Of Breath  . Oxycodone Hcl Shortness Of Breath  . Telmisartan-Hctz Shortness Of Breath and Palpitations    Dizziness, too  . Ramipril   . Aspirin Other (See Comments) and Rash    Other Reaction: confusion Can't take high dose  . Atorvastatin Other (See Comments)    myalgia  . Buspirone Hcl Other (See Comments)    headache  . Codeine Nausea Only and Other (See Comments)    Other Reaction: confusion  . Codeine Phosphate Itching and Nausea Only  . Hydrocodone-Acetaminophen Nausea Only and Other (See Comments)    Other Reaction: confusion  . Morphine Sulfate Itching  .  Paroxetine Other (See Comments)    Paresthesias: R arm, R leg, r side of face  . Rabeprazole Other (See Comments)    headache    Past Medical History, Surgical history, Social history, and Family History were reviewed and updated.  Review of Systems: As above  Physical Exam:  weight is 206 lb (93.4 kg). His oral temperature is 98.3 F (36.8 C). His blood pressure is 144/90 (abnormal) and his pulse is 70. His respiration is 16 and oxygen saturation is 95%.   Well-developed and well-nourished African-American gentleman. There are no ocular or oral lesions. He has a few palpable cervical or  supraclavicular lymph nodes. The submandibular lymph nodes are more prominent than when we saw him 2 weeks ago. I would say probably measure 1-1.5 cm. Most of the posterior cervical chain are mobile and less than 5 mm. Lungs are clear. Has a rales, wheezes or rhonchi. Cardiac exam regular rate and rhythm with no murmurs, rubs or bruits. Axillary exam that show a right axillary lymph node that measures about 1.0 cm. This is mobile and nontender. There is mild left axillary adenopathy. No lymph node measures more than 1 cm. Abdomen is soft. Has good bowel sounds. There is no fluid wave. There is no palpable liver or spleen tip. Back exam shows a well-healed thoracoscopy scar in the right lateral chest wall. Where he had the chest tube in place just inferior to the thoracoscopy scar is also healing nicely. There is no erythema or warmth over the thoracoscopy site. no tenderness over the spine, ribs or hips. Extremities shows no clubbing, cyanosis or edema. No venous cord is palpable is legs. He has a negative Homans sign. Skin exam shows no rashes, ecchymoses or petechia. Neurological exam is nonfocal.  Lab Results  Component Value Date   WBC 17.6 (H) 05/12/2017   HGB 14.5 05/12/2017   HCT 42.6 05/12/2017   MCV 97 05/12/2017   PLT 134 (L) 05/12/2017     Chemistry      Component Value Date/Time   NA 138 05/12/2017 1347   NA 141 04/21/2017 1202   NA 139 06/27/2016 1410   K 4.7 05/12/2017 1347   K 4.7 04/21/2017 1202   K 5.1 06/27/2016 1410   CL 101 05/12/2017 1347   CL 105 04/21/2017 1202   CO2 27 05/12/2017 1347   CO2 30 04/21/2017 1202   CO2 28 06/27/2016 1410   BUN 23 05/12/2017 1347   BUN 14 04/21/2017 1202   BUN 20.9 06/27/2016 1410   CREATININE 1.54 (H) 05/12/2017 1347   CREATININE 1.3 (H) 04/21/2017 1202   CREATININE 1.6 (H) 06/27/2016 1410      Component Value Date/Time   CALCIUM 9.9 05/12/2017 1347   CALCIUM 9.6 04/21/2017 1202   CALCIUM 9.6 06/27/2016 1410   ALKPHOS 69  05/12/2017 1347   ALKPHOS 48 04/21/2017 1202   ALKPHOS 62 06/27/2016 1410   AST 19 05/12/2017 1347   AST 25 04/21/2017 1202   AST 18 06/27/2016 1410   ALT 22 05/12/2017 1347   ALT 34 04/21/2017 1202   ALT 24 06/27/2016 1410   BILITOT 0.3 05/12/2017 1347   BILITOT 0.80 04/21/2017 1202   BILITOT 0.46 06/27/2016 1410         Impression and Plan: Mr. Nydam is 71 year-old African-American gentleman. He has recurrent pulmonary embolism. I think he needs lifelong anticoagulation.He is doing very well on Xarelto. He enjoys being on Xarelto.  I really think that we have  to give him some kind of treatments that we can decrease his lymphadenopathy. I would worry that if he goes over to [name], that he would have some complication from his lymphadenopathy in and out in a hospital over there and problems could arise because of the poor or inadequate medical care and facility that are typically present in [name].  I think that my goal would be just to try to shrink his lymph nodes. I think we can do this quickly with Rituxan/bendamustine.  I talked to he and his knees for about 45 minutes today about treatment. I explained to them how Rituxan/bendamustine work. I explained the side effects that happens with Rituxan the first time a patient gets it. I think that giving him Singulair to take the day before and the morning of treatment would be a good idea to try to minimize a reaction to Rituxan.  I think that with just 2 cycles of treatment, we can easily get his lymphadenopathy down and he could go over to [name] without any problems. I think his immune system should be adequate. I would not think that he would be at high risk for any type of infection.  I do not think that he needs a Port-A-Cath. Again I just want to do 2 cycles of treatments that we get his lymphadenopathy down to try to minimize complications from this.  I gave him information sheets about the medications. I explained the  toxicities. They both understand very well.  We will plan to get him back about 2 weeks after treatment to see how he is doing.    I spent about 30 minutes with him today.  Volanda Napoleon, MD 7/2/20185:57 PM

## 2017-05-13 ENCOUNTER — Other Ambulatory Visit: Payer: Self-pay

## 2017-05-13 ENCOUNTER — Encounter: Payer: Self-pay | Admitting: *Deleted

## 2017-05-13 LAB — LACTATE DEHYDROGENASE: LDH: 320 U/L — ABNORMAL HIGH (ref 125–245)

## 2017-05-21 ENCOUNTER — Other Ambulatory Visit: Payer: Self-pay | Admitting: Oncology

## 2017-05-21 ENCOUNTER — Other Ambulatory Visit: Payer: Self-pay | Admitting: *Deleted

## 2017-05-21 ENCOUNTER — Ambulatory Visit (HOSPITAL_BASED_OUTPATIENT_CLINIC_OR_DEPARTMENT_OTHER): Payer: Medicare Other

## 2017-05-21 ENCOUNTER — Encounter: Payer: Self-pay | Admitting: Hematology & Oncology

## 2017-05-21 ENCOUNTER — Other Ambulatory Visit (HOSPITAL_BASED_OUTPATIENT_CLINIC_OR_DEPARTMENT_OTHER): Payer: Medicare Other

## 2017-05-21 VITALS — BP 113/59 | HR 69 | Temp 98.0°F | Resp 20 | Wt 206.5 lb

## 2017-05-21 DIAGNOSIS — C9110 Chronic lymphocytic leukemia of B-cell type not having achieved remission: Secondary | ICD-10-CM | POA: Diagnosis not present

## 2017-05-21 DIAGNOSIS — Z5111 Encounter for antineoplastic chemotherapy: Secondary | ICD-10-CM

## 2017-05-21 DIAGNOSIS — Z5112 Encounter for antineoplastic immunotherapy: Secondary | ICD-10-CM | POA: Diagnosis not present

## 2017-05-21 LAB — CBC WITH DIFFERENTIAL (CANCER CENTER ONLY)
HCT: 41.4 % (ref 38.7–49.9)
HGB: 13.9 g/dL (ref 13.0–17.1)
MCH: 33 pg (ref 28.0–33.4)
MCHC: 33.6 g/dL (ref 32.0–35.9)
MCV: 98 fL (ref 82–98)
Platelets: 108 10*3/uL — ABNORMAL LOW (ref 145–400)
RBC: 4.21 10*6/uL (ref 4.20–5.70)
RDW: 12.9 % (ref 11.1–15.7)
WBC: 15 10*3/uL — ABNORMAL HIGH (ref 4.0–10.0)

## 2017-05-21 LAB — CMP (CANCER CENTER ONLY)
ALT(SGPT): 29 U/L (ref 10–47)
AST: 24 U/L (ref 11–38)
Albumin: 3.6 g/dL (ref 3.3–5.5)
Alkaline Phosphatase: 50 U/L (ref 26–84)
BUN, Bld: 17 mg/dL (ref 7–22)
CO2: 30 mEq/L (ref 18–33)
Calcium: 9.2 mg/dL (ref 8.0–10.3)
Chloride: 103 mEq/L (ref 98–108)
Creat: 1.6 mg/dl — ABNORMAL HIGH (ref 0.6–1.2)
Glucose, Bld: 93 mg/dL (ref 73–118)
Potassium: 4.4 mEq/L (ref 3.3–4.7)
Sodium: 141 mEq/L (ref 128–145)
Total Bilirubin: 0.8 mg/dl (ref 0.20–1.60)
Total Protein: 6.3 g/dL — ABNORMAL LOW (ref 6.4–8.1)

## 2017-05-21 LAB — MANUAL DIFFERENTIAL (CHCC SATELLITE)
ALC: 12.9 10*3/uL — ABNORMAL HIGH (ref 0.9–3.3)
ANC (CHCC HP manual diff): 1.5 10*3/uL (ref 1.5–6.5)
LYMPH: 86 % — ABNORMAL HIGH (ref 14–48)
MONO: 4 % (ref 0–13)
PLT EST ~~LOC~~: DECREASED
RBC Comments: NORMAL
SEG: 10 % — ABNORMAL LOW (ref 40–75)

## 2017-05-21 MED ORDER — PALONOSETRON HCL INJECTION 0.25 MG/5ML
INTRAVENOUS | Status: AC
  Filled 2017-05-21: qty 5

## 2017-05-21 MED ORDER — SODIUM CHLORIDE 0.9 % IV SOLN: Freq: Once | INTRAVENOUS | Status: DC

## 2017-05-21 MED ORDER — SODIUM CHLORIDE 0.9 % IV SOLN
90.0000 mg/m2 | Freq: Once | INTRAVENOUS | Status: AC
  Administered 2017-05-21: 200 mg via INTRAVENOUS
  Filled 2017-05-21: qty 8

## 2017-05-21 MED ORDER — ONDANSETRON HCL 8 MG PO TABS: 8.0000 mg | ORAL_TABLET | Freq: Two times a day (BID) | ORAL | 1 refills | Status: DC | PRN

## 2017-05-21 MED ORDER — DEXAMETHASONE SODIUM PHOSPHATE 10 MG/ML IJ SOLN
INTRAMUSCULAR | Status: AC
  Filled 2017-05-21: qty 1

## 2017-05-21 MED ORDER — ACETAMINOPHEN 325 MG PO TABS
ORAL_TABLET | ORAL | Status: AC
  Filled 2017-05-21: qty 2

## 2017-05-21 MED ORDER — PALONOSETRON HCL INJECTION 0.25 MG/5ML
0.2500 mg | Freq: Once | INTRAVENOUS | Status: AC
  Administered 2017-05-21: 0.25 mg via INTRAVENOUS

## 2017-05-21 MED ORDER — ACETAMINOPHEN 325 MG PO TABS
650.0000 mg | ORAL_TABLET | Freq: Once | ORAL | Status: AC
Start: 2017-05-21 — End: 2017-05-21
  Administered 2017-05-21: 650 mg via ORAL

## 2017-05-21 MED ORDER — MONTELUKAST SODIUM 10 MG PO TABS: 10.0000 mg | ORAL_TABLET | Freq: Every day | ORAL | 0 refills | Status: DC

## 2017-05-21 MED ORDER — DEXAMETHASONE SODIUM PHOSPHATE 10 MG/ML IJ SOLN
10.0000 mg | Freq: Once | INTRAMUSCULAR | Status: AC
  Administered 2017-05-21: 10 mg via INTRAVENOUS

## 2017-05-21 MED ORDER — DIPHENHYDRAMINE HCL 25 MG PO CAPS
50.0000 mg | ORAL_CAPSULE | Freq: Once | ORAL | Status: AC
  Administered 2017-05-21: 50 mg via ORAL

## 2017-05-21 MED ORDER — PROCHLORPERAZINE MALEATE 10 MG PO TABS: 10.0000 mg | ORAL_TABLET | Freq: Four times a day (QID) | ORAL | 1 refills | Status: DC | PRN

## 2017-05-21 MED ORDER — SODIUM CHLORIDE 0.9 % IV SOLN
Freq: Once | INTRAVENOUS | Status: AC
  Administered 2017-05-21: 09:00:00 via INTRAVENOUS

## 2017-05-21 MED ORDER — DIPHENHYDRAMINE HCL 25 MG PO CAPS
ORAL_CAPSULE | ORAL | Status: AC
  Filled 2017-05-21: qty 2

## 2017-05-21 MED ORDER — DEXAMETHASONE 4 MG PO TABS: 8.0000 mg | ORAL_TABLET | Freq: Every day | ORAL | 1 refills | Status: DC

## 2017-05-21 MED ORDER — SODIUM CHLORIDE 0.9 % IV SOLN
375.0000 mg/m2 | Freq: Once | INTRAVENOUS | Status: AC
  Administered 2017-05-21: 800 mg via INTRAVENOUS
  Filled 2017-05-21: qty 30

## 2017-05-21 NOTE — Patient Instructions (Signed)
[facility] Discharge Instructions for Patients Receiving Chemotherapy  Today you received the following chemotherapy agents:  Bendeka and Rituxan To help prevent nausea and vomiting after your treatment, we encourage you to take your nausea medications as directed on your bottles. If you develop nausea and vomiting that is not controlled by your nausea medication, call the clinic.   BELOW ARE SYMPTOMS THAT SHOULD BE REPORTED IMMEDIATELY:  *FEVER GREATER THAN 100.5 F  *CHILLS WITH OR WITHOUT FEVER  NAUSEA AND VOMITING THAT IS NOT CONTROLLED WITH YOUR NAUSEA MEDICATION  *UNUSUAL SHORTNESS OF BREATH  *UNUSUAL BRUISING OR BLEEDING  TENDERNESS IN MOUTH AND THROAT WITH OR WITHOUT PRESENCE OF ULCERS  *URINARY PROBLEMS  *BOWEL PROBLEMS  UNUSUAL RASH Items with * indicate a potential emergency and should be followed up as soon as possible.  Feel free to call the clinic you have any questions or concerns. The clinic phone number is [phone].  Please show the [facility] at check-in to the Emergency Department and triage nurse. Bendamustine Injection What is this medicine? BENDAMUSTINE (BEN da MUS teen) is a chemotherapy drug. It is used to treat chronic lymphocytic leukemia and non-Hodgkin lymphoma. This medicine may be used for other purposes; ask your health care provider or pharmacist if you have questions. COMMON BRAND NAME(S): BENDEKA, Treanda What should I tell my health care provider before I take this medicine? They need to know if you have any of these conditions: -infection (especially a virus infection such as chickenpox, cold sores, or herpes) -kidney disease -liver disease -an unusual or allergic reaction to bendamustine, mannitol, other medicines, foods, dyes, or preservatives -pregnant or trying to get pregnant -breast-feeding How should I use this medicine? This medicine is for infusion into a vein. It is given by a health care professional  in a hospital or clinic setting. Talk to your pediatrician regarding the use of this medicine in children. Special care may be needed. Overdosage: If you think you have taken too much of this medicine contact a poison control center or emergency room at once. NOTE: This medicine is only for you. Do not share this medicine with others. What if I miss a dose? It is important not to miss your dose. Call your doctor or health care professional if you are unable to keep an appointment. What may interact with this medicine? Do not take this medicine with any of the following medications: -clozapine This medicine may also interact with the following medications: -atazanavir -cimetidine -ciprofloxacin -enoxacin -fluvoxamine -medicines for seizures like carbamazepine and phenobarbital -mexiletine -rifampin -tacrine -thiabendazole -zileuton This list may not describe all possible interactions. Give your health care provider a list of all the medicines, herbs, non-prescription drugs, or dietary supplements you use. Also tell them if you smoke, drink alcohol, or use illegal drugs. Some items may interact with your medicine. What should I watch for while using this medicine? This drug may make you feel generally unwell. This is not uncommon, as chemotherapy can affect healthy cells as well as cancer cells. Report any side effects. Continue your course of treatment even though you feel ill unless your doctor tells you to stop. You may need blood work done while you are taking this medicine. Call your doctor or health care professional for advice if you get a fever, chills or sore throat, or other symptoms of a cold or flu. Do not treat yourself. This drug decreases your body's ability to fight infections. Try to avoid being around people  who are sick. This medicine may increase your risk to bruise or bleed. Call your doctor or health care professional if you notice any unusual bleeding. Talk to your  doctor about your risk of cancer. You may be more at risk for certain types of cancers if you take this medicine. Do not become pregnant while taking this medicine or for 3 months after stopping it. Women should inform their doctor if they wish to become pregnant or think they might be pregnant. Men should not father a child while taking this medicine and for 3 months after stopping it.There is a potential for serious side effects to an unborn child. Talk to your health care professional or pharmacist for more information. Do not breast-feed an infant while taking this medicine. This medicine may interfere with the ability to have a child. You should talk with your doctor or health care professional if you are concerned about your fertility. What side effects may I notice from receiving this medicine? Side effects that you should report to your doctor or health care professional as soon as possible: -allergic reactions like skin rash, itching or hives, swelling of the face, lips, or tongue -low blood counts - this medicine may decrease the number of white blood cells, red blood cells and platelets. You may be at increased risk for infections and bleeding. -redness, blistering, peeling or loosening of the skin, including inside the mouth -signs of infection - fever or chills, cough, sore throat, pain or difficulty passing urine -signs of decreased platelets or bleeding - bruising, pinpoint red spots on the skin, black, tarry stools, blood in the urine -signs of decreased red blood cells - unusually weak or tired, fainting spells, lightheadedness -signs and symptoms of kidney injury like trouble passing urine or change in the amount of urine -signs and symptoms of liver injury like dark yellow or brown urine; general ill feeling or flu-like symptoms; light-colored stools; loss of appetite; nausea; right upper belly pain; unusually weak or tired; yellowing of the eyes or skin Side effects that usually do  not require medical attention (report to your doctor or health care professional if they continue or are bothersome): -constipation -decreased appetite -diarrhea -headache -mouth sores -nausea/vomiting -tiredness This list may not describe all possible side effects. Call your doctor for medical advice about side effects. You may report side effects to FDA at 1-800-FDA-1088. Where should I keep my medicine? This drug is given in a hospital or clinic and will not be stored at home. NOTE: This sheet is a summary. It may not cover all possible information. If you have questions about this medicine, talk to your doctor, pharmacist, or health care provider.  2018 Elsevier/Gold Standard (2015-08-31 08:45:41) Rituximab injection What is this medicine? RITUXIMAB (ri TUX i mab) is a monoclonal antibody. It is used to treat certain types of cancer like non-Hodgkin lymphoma and chronic lymphocytic leukemia. It is also used to treat rheumatoid arthritis, granulomatosis with polyangiitis (or Wegener's granulomatosis), and microscopic polyangiitis. This medicine may be used for other purposes; ask your health care provider or pharmacist if you have questions. COMMON BRAND NAME(S): Rituxan What should I tell my health care provider before I take this medicine? They need to know if you have any of these conditions: -heart disease -infection (especially a virus infection such as hepatitis B, chickenpox, cold sores, or herpes) -immune system problems -irregular heartbeat -kidney disease -lung or breathing disease, like asthma -recently received or scheduled to receive a vaccine -an unusual or  allergic reaction to rituximab, mouse proteins, other medicines, foods, dyes, or preservatives -pregnant or trying to get pregnant -breast-feeding How should I use this medicine? This medicine is for infusion into a vein. It is administered in a hospital or clinic by a specially trained health care professional. A  special MedGuide will be given to you by the pharmacist with each prescription and refill. Be sure to read this information carefully each time. Talk to your pediatrician regarding the use of this medicine in children. This medicine is not approved for use in children. Overdosage: If you think you have taken too much of this medicine contact a poison control center or emergency room at once. NOTE: This medicine is only for you. Do not share this medicine with others. What if I miss a dose? It is important not to miss a dose. Call your doctor or health care professional if you are unable to keep an appointment. What may interact with this medicine? -cisplatin -other medicines for arthritis like disease modifying antirheumatic drugs or tumor necrosis factor inhibitors -live virus vaccines This list may not describe all possible interactions. Give your health care provider a list of all the medicines, herbs, non-prescription drugs, or dietary supplements you use. Also tell them if you smoke, drink alcohol, or use illegal drugs. Some items may interact with your medicine. What should I watch for while using this medicine? Your condition will be monitored carefully while you are receiving this medicine. You may need blood work done while you are taking this medicine. This medicine can cause serious allergic reactions. To reduce your risk you may need to take medicine before treatment with this medicine. Take your medicine as directed. In some patients, this medicine may cause a serious brain infection that may cause death. If you have any problems seeing, thinking, speaking, walking, or standing, tell your doctor right away. If you cannot reach your doctor, urgently seek other source of medical care. Call your doctor or health care professional for advice if you get a fever, chills or sore throat, or other symptoms of a cold or flu. Do not treat yourself. This drug decreases your body's ability to fight  infections. Try to avoid being around people who are sick. Do not become pregnant while taking this medicine or for 12 months after stopping it. Women should inform their doctor if they wish to become pregnant or think they might be pregnant. There is a potential for serious side effects to an unborn child. Talk to your health care professional or pharmacist for more information. What side effects may I notice from receiving this medicine? Side effects that you should report to your doctor or health care professional as soon as possible: -breathing problems -chest pain -dizziness or feeling faint -fast, irregular heartbeat -low blood counts - this medicine may decrease the number of white blood cells, red blood cells and platelets. You may be at increased risk for infections and bleeding. -mouth sores -redness, blistering, peeling or loosening of the skin, including inside the mouth (this can be added for any serious or exfoliative rash that could lead to hospitalization) -signs of infection - fever or chills, cough, sore throat, pain or difficulty passing urine -signs and symptoms of kidney injury like trouble passing urine or change in the amount of urine -signs and symptoms of liver injury like dark yellow or brown urine; general ill feeling or flu-like symptoms; light-colored stools; loss of appetite; nausea; right upper belly pain; unusually weak or tired; yellowing of  the eyes or skin -stomach pain -vomiting Side effects that usually do not require medical attention (report to your doctor or health care professional if they continue or are bothersome): -headache -joint pain -muscle cramps or muscle pain This list may not describe all possible side effects. Call your doctor for medical advice about side effects. You may report side effects to FDA at 1-800-FDA-1088. Where should I keep my medicine? This drug is given in a hospital or clinic and will not be stored at home. NOTE: This sheet  is a summary. It may not cover all possible information. If you have questions about this medicine, talk to your doctor, pharmacist, or health care provider.  2018 Elsevier/Gold Standard (2016-06-05 15:28:09)

## 2017-05-22 ENCOUNTER — Ambulatory Visit: Payer: 59 | Admitting: Psychology

## 2017-05-22 ENCOUNTER — Ambulatory Visit (HOSPITAL_BASED_OUTPATIENT_CLINIC_OR_DEPARTMENT_OTHER): Payer: Medicare Other

## 2017-05-22 VITALS — BP 127/72 | HR 90 | Temp 97.8°F | Resp 99

## 2017-05-22 DIAGNOSIS — C9110 Chronic lymphocytic leukemia of B-cell type not having achieved remission: Secondary | ICD-10-CM | POA: Diagnosis not present

## 2017-05-22 DIAGNOSIS — Z5111 Encounter for antineoplastic chemotherapy: Secondary | ICD-10-CM

## 2017-05-22 LAB — HEPATITIS B CORE ANTIBODY, IGM: Hep B Core Ab, IgM: NEGATIVE

## 2017-05-22 LAB — HEPATITIS B SURFACE ANTIGEN: HBsAg Screen: NEGATIVE

## 2017-05-22 MED ORDER — SODIUM CHLORIDE 0.9 % IV SOLN
Freq: Once | INTRAVENOUS | Status: AC
  Administered 2017-05-22: 09:00:00 via INTRAVENOUS

## 2017-05-22 MED ORDER — SODIUM CHLORIDE 0.9 % IV SOLN
90.0000 mg/m2 | Freq: Once | INTRAVENOUS | Status: AC
  Administered 2017-05-22: 200 mg via INTRAVENOUS
  Filled 2017-05-22: qty 8

## 2017-05-22 MED ORDER — DEXAMETHASONE SODIUM PHOSPHATE 10 MG/ML IJ SOLN
INTRAMUSCULAR | Status: AC
  Filled 2017-05-22: qty 1

## 2017-05-22 MED ORDER — DEXAMETHASONE SODIUM PHOSPHATE 10 MG/ML IJ SOLN
10.0000 mg | Freq: Once | INTRAMUSCULAR | Status: AC
  Administered 2017-05-22: 10 mg via INTRAVENOUS

## 2017-05-22 NOTE — Patient Instructions (Signed)

## 2017-05-26 ENCOUNTER — Encounter: Payer: Self-pay | Admitting: Hematology & Oncology

## 2017-05-29 ENCOUNTER — Ambulatory Visit (INDEPENDENT_AMBULATORY_CARE_PROVIDER_SITE_OTHER): Payer: 59 | Admitting: Psychology

## 2017-05-29 DIAGNOSIS — F4321 Adjustment disorder with depressed mood: Secondary | ICD-10-CM | POA: Diagnosis not present

## 2017-06-02 ENCOUNTER — Encounter: Payer: Self-pay | Admitting: Hematology & Oncology

## 2017-06-06 ENCOUNTER — Encounter: Payer: Self-pay | Admitting: Cardiovascular Disease

## 2017-06-13 ENCOUNTER — Encounter: Payer: Self-pay | Admitting: Hematology & Oncology

## 2017-06-16 ENCOUNTER — Other Ambulatory Visit: Payer: Self-pay | Admitting: *Deleted

## 2017-06-16 ENCOUNTER — Encounter: Payer: Self-pay | Admitting: Hematology & Oncology

## 2017-06-16 DIAGNOSIS — C9110 Chronic lymphocytic leukemia of B-cell type not having achieved remission: Secondary | ICD-10-CM

## 2017-06-17 ENCOUNTER — Ambulatory Visit (HOSPITAL_BASED_OUTPATIENT_CLINIC_OR_DEPARTMENT_OTHER): Payer: Medicare Other

## 2017-06-17 ENCOUNTER — Ambulatory Visit (HOSPITAL_BASED_OUTPATIENT_CLINIC_OR_DEPARTMENT_OTHER): Payer: Medicare Other | Admitting: Hematology & Oncology

## 2017-06-17 ENCOUNTER — Other Ambulatory Visit (HOSPITAL_BASED_OUTPATIENT_CLINIC_OR_DEPARTMENT_OTHER): Payer: Medicare Other

## 2017-06-17 VITALS — BP 144/72 | HR 72 | Temp 98.1°F | Resp 18

## 2017-06-17 VITALS — BP 115/73 | HR 79 | Temp 97.7°F | Resp 18 | Wt 204.2 lb

## 2017-06-17 DIAGNOSIS — R918 Other nonspecific abnormal finding of lung field: Secondary | ICD-10-CM

## 2017-06-17 DIAGNOSIS — Z7901 Long term (current) use of anticoagulants: Secondary | ICD-10-CM

## 2017-06-17 DIAGNOSIS — C9110 Chronic lymphocytic leukemia of B-cell type not having achieved remission: Secondary | ICD-10-CM | POA: Diagnosis not present

## 2017-06-17 DIAGNOSIS — Z5112 Encounter for antineoplastic immunotherapy: Secondary | ICD-10-CM

## 2017-06-17 DIAGNOSIS — I2699 Other pulmonary embolism without acute cor pulmonale: Secondary | ICD-10-CM

## 2017-06-17 DIAGNOSIS — R5383 Other fatigue: Secondary | ICD-10-CM | POA: Diagnosis not present

## 2017-06-17 DIAGNOSIS — Z5111 Encounter for antineoplastic chemotherapy: Secondary | ICD-10-CM | POA: Diagnosis not present

## 2017-06-17 LAB — CMP (CANCER CENTER ONLY)
ALT(SGPT): 22 U/L (ref 10–47)
AST: 21 U/L (ref 11–38)
Albumin: 3.7 g/dL (ref 3.3–5.5)
Alkaline Phosphatase: 55 U/L (ref 26–84)
BUN, Bld: 14 mg/dL (ref 7–22)
CO2: 29 mEq/L (ref 18–33)
Calcium: 9.5 mg/dL (ref 8.0–10.3)
Chloride: 108 mEq/L (ref 98–108)
Creat: 1.4 mg/dl — ABNORMAL HIGH (ref 0.6–1.2)
Glucose, Bld: 113 mg/dL (ref 73–118)
Potassium: 4.1 mEq/L (ref 3.3–4.7)
Sodium: 141 mEq/L (ref 128–145)
Total Bilirubin: 0.7 mg/dl (ref 0.20–1.60)
Total Protein: 6.9 g/dL (ref 6.4–8.1)

## 2017-06-17 LAB — CBC WITH DIFFERENTIAL (CANCER CENTER ONLY)
BASO#: 0 10*3/uL (ref 0.0–0.2)
BASO%: 0.3 % (ref 0.0–2.0)
EOS%: 3 % (ref 0.0–7.0)
Eosinophils Absolute: 0.1 10*3/uL (ref 0.0–0.5)
HCT: 40.6 % (ref 38.7–49.9)
HGB: 14.2 g/dL (ref 13.0–17.1)
LYMPH#: 1.4 10*3/uL (ref 0.9–3.3)
LYMPH%: 45.2 % (ref 14.0–48.0)
MCH: 33.3 pg (ref 28.0–33.4)
MCHC: 35 g/dL (ref 32.0–35.9)
MCV: 95 fL (ref 82–98)
MONO#: 0.5 10*3/uL (ref 0.1–0.9)
MONO%: 16.3 % — ABNORMAL HIGH (ref 0.0–13.0)
NEUT#: 1.1 10*3/uL — ABNORMAL LOW (ref 1.5–6.5)
NEUT%: 35.2 % — ABNORMAL LOW (ref 40.0–80.0)
Platelets: 109 10*3/uL — ABNORMAL LOW (ref 145–400)
RBC: 4.27 10*6/uL (ref 4.20–5.70)
RDW: 12.3 % (ref 11.1–15.7)
WBC: 3 10*3/uL — ABNORMAL LOW (ref 4.0–10.0)

## 2017-06-17 LAB — LACTATE DEHYDROGENASE: LDH: 329 U/L — ABNORMAL HIGH (ref 125–245)

## 2017-06-17 LAB — TECHNOLOGIST REVIEW CHCC SATELLITE

## 2017-06-17 MED ORDER — ACETAMINOPHEN 325 MG PO TABS
650.0000 mg | ORAL_TABLET | Freq: Once | ORAL | Status: AC
  Administered 2017-06-17: 650 mg via ORAL

## 2017-06-17 MED ORDER — SODIUM CHLORIDE 0.9% FLUSH
10.0000 mL | INTRAVENOUS | Status: DC | PRN
  Filled 2017-06-17: qty 10

## 2017-06-17 MED ORDER — DEXAMETHASONE SODIUM PHOSPHATE 10 MG/ML IJ SOLN
10.0000 mg | Freq: Once | INTRAMUSCULAR | Status: AC
  Administered 2017-06-17: 10 mg via INTRAVENOUS

## 2017-06-17 MED ORDER — SODIUM CHLORIDE 0.9 % IV SOLN
80.0000 mg/m2 | Freq: Once | INTRAVENOUS | Status: AC
  Administered 2017-06-17: 175 mg via INTRAVENOUS
  Filled 2017-06-17: qty 7

## 2017-06-17 MED ORDER — DEXAMETHASONE SODIUM PHOSPHATE 10 MG/ML IJ SOLN
INTRAMUSCULAR | Status: AC
  Filled 2017-06-17: qty 1

## 2017-06-17 MED ORDER — SODIUM CHLORIDE 0.9 % IV SOLN
375.0000 mg/m2 | Freq: Once | INTRAVENOUS | Status: AC
  Administered 2017-06-17: 800 mg via INTRAVENOUS
  Filled 2017-06-17: qty 50

## 2017-06-17 MED ORDER — PALONOSETRON HCL INJECTION 0.25 MG/5ML
0.2500 mg | Freq: Once | INTRAVENOUS | Status: AC
  Administered 2017-06-17: 0.25 mg via INTRAVENOUS

## 2017-06-17 MED ORDER — PALONOSETRON HCL INJECTION 0.25 MG/5ML
INTRAVENOUS | Status: AC
  Filled 2017-06-17: qty 5

## 2017-06-17 MED ORDER — SODIUM CHLORIDE 0.9 % IV SOLN
Freq: Once | INTRAVENOUS | Status: AC
  Administered 2017-06-17: 09:00:00 via INTRAVENOUS

## 2017-06-17 MED ORDER — HEPARIN SOD (PORK) LOCK FLUSH 100 UNIT/ML IV SOLN
500.0000 [IU] | Freq: Once | INTRAVENOUS | Status: DC | PRN
  Filled 2017-06-17: qty 5

## 2017-06-17 MED ORDER — DIPHENHYDRAMINE HCL 25 MG PO CAPS
ORAL_CAPSULE | ORAL | Status: AC
  Filled 2017-06-17: qty 2

## 2017-06-17 MED ORDER — ACETAMINOPHEN 325 MG PO TABS
ORAL_TABLET | ORAL | Status: AC
  Filled 2017-06-17: qty 2

## 2017-06-17 MED ORDER — DIPHENHYDRAMINE HCL 25 MG PO CAPS
50.0000 mg | ORAL_CAPSULE | Freq: Once | ORAL | Status: AC
  Administered 2017-06-17: 50 mg via ORAL

## 2017-06-17 NOTE — Patient Instructions (Signed)
[facility] Discharge Instructions for Patients Receiving Chemotherapy  Today you received the following chemotherapy agents:  Rituxan and Bendeka  To help prevent nausea and vomiting after your treatment, we encourage you to take your nausea medication as ordered per MD.   If you develop nausea and vomiting that is not controlled by your nausea medication, call the clinic.   BELOW ARE SYMPTOMS THAT SHOULD BE REPORTED IMMEDIATELY:  *FEVER GREATER THAN 100.5 F  *CHILLS WITH OR WITHOUT FEVER  NAUSEA AND VOMITING THAT IS NOT CONTROLLED WITH YOUR NAUSEA MEDICATION  *UNUSUAL SHORTNESS OF BREATH  *UNUSUAL BRUISING OR BLEEDING  TENDERNESS IN MOUTH AND THROAT WITH OR WITHOUT PRESENCE OF ULCERS  *URINARY PROBLEMS  *BOWEL PROBLEMS  UNUSUAL RASH Items with * indicate a potential emergency and should be followed up as soon as possible.  Feel free to call the clinic you have any questions or concerns. The clinic phone number is [phone].  Please show the [facility] at check-in to the Emergency Department and triage nurse.

## 2017-06-17 NOTE — Progress Notes (Signed)
Hematology and Oncology Follow Up Visit  [name] [facility] [id number] [birth year] 71 y.o. 06/17/2017   Principle Diagnosis:   Right lower lobe pulmonary nodule- non-malignant  Recurrent pulmonary embolism  Progressive  Current Therapy:    Xarelto 20 mg by mouth daily       Rituxan/bendamustine-s/p cycle #1      Interim History:  Mr.  Bell is back for follow-up. He tolerated his first cycle of treatment quite nicely. His lymph nodes have resolved. He said it began to resolve after about 1 week. His biggest problem is just been some fatigue. However, he says he still doing what he likes.  He's had no issues with nausea or vomiting. He's had no cough. He's had no shortness of breath. He's had no change in bowel or bladder habits.  He has had no fever. He has had no bleeding. There is been no leg swelling. He has had no rashes.  Overall, his performance status is ECOG 1.  Medications:  Current Outpatient Prescriptions:  .  amoxicillin-clavulanate (AUGMENTIN) 875-125 MG tablet, Take 1 tablet by mouth 2 (two) times daily., Disp: 14 tablet, Rfl: 0 .  dexamethasone (DECADRON) 4 MG tablet, Take 2 tablets (8 mg total) by mouth daily. Start the day after bendamustine chemotherapy for 2 days. Take with food., Disp: 30 tablet, Rfl: 1 .  diclofenac sodium (VOLTAREN) 1 % GEL, Apply 2 g topically as needed., Disp: , Rfl:  .  FLUoxetine (PROZAC) 10 MG capsule, TAKE 1 CAPSULE (10 MG TOTAL) BY MOUTH DAILY., Disp: 90 capsule, Rfl: 3 .  imipramine (TOFRANIL) 10 MG tablet, TAKE 1 TABLET (10 MG TOTAL) BY MOUTH AT BEDTIME., Disp: 90 tablet, Rfl: 0 .  LORazepam (ATIVAN) 0.5 MG tablet, TAKE 1 TABLET BY MOUTH EVERY DAY AS NEEDED FOR ANXIETY, Disp: 30 tablet, Rfl: 5 .  metoprolol succinate (TOPROL-XL) 25 MG 24 hr tablet, TAKE 1 TABLET BY MOUTH EVERY DAY (Patient taking differently: TAKE 1/2 TABLET BY MOUTH EVERY DAY), Disp: 30 tablet, Rfl: 8 .  montelukast (SINGULAIR) 10 MG tablet, Take 1 tablet (10 mg total)  by mouth at bedtime., Disp: 4 tablet, Rfl: 0 .  ondansetron (ZOFRAN) 8 MG tablet, Take 1 tablet (8 mg total) by mouth 2 (two) times daily as needed for refractory nausea / vomiting. Start on day 2 after bendamustine chemo., Disp: 30 tablet, Rfl: 1 .  prochlorperazine (COMPAZINE) 10 MG tablet, Take 1 tablet (10 mg total) by mouth every 6 (six) hours as needed (Nausea or vomiting)., Disp: 30 tablet, Rfl: 1 .  rivaroxaban (XARELTO) 20 MG TABS tablet, Take 1 tablet (20 mg total) by mouth daily with supper., Disp: 90 tablet, Rfl: 1 .  rosuvastatin (CRESTOR) 10 MG tablet, Take 1 tablet by mouth every Monday, Wednesday, and Friday., Disp: 45 tablet, Rfl: 3  Allergies:  Allergies  Allergen Reactions  . Hydrocodone-Acetaminophen Shortness Of Breath  . Oxycodone Hcl Shortness Of Breath  . Telmisartan-Hctz Shortness Of Breath and Palpitations    Dizziness, too  . Ramipril   . Aspirin Other (See Comments) and Rash    Other Reaction: confusion Can't take high dose  . Atorvastatin Other (See Comments)    myalgia  . Buspirone Hcl Other (See Comments)    headache  . Codeine Nausea Only and Other (See Comments)    Other Reaction: confusion  . Codeine Phosphate Itching and Nausea Only  . Hydrocodone-Acetaminophen Nausea Only and Other (See Comments)    Other Reaction: confusion  . Morphine Sulfate Itching  .  Paroxetine Other (See Comments)    Paresthesias: R arm, R leg, r side of face  . Rabeprazole Other (See Comments)    headache    Past Medical History, Surgical history, Social history, and Family History were reviewed and updated.  Review of Systems: As above  Physical Exam:  weight is 204 lb 4 oz (92.6 kg). His oral temperature is 97.7 F (36.5 C). His blood pressure is 115/73 and his pulse is 79. His respiration is 18 and oxygen saturation is 98%.   Well-developed and well-nourished African-American gentleman. There are no ocular or oral lesions. He has No palpable cervical or  supraclavicular lymph nodes.  Lungs are clear. Has a rales, wheezes or rhonchi. Cardiac exam regular rate and rhythm with no murmurs, rubs or bruits. Axillary exam shows no bilateral axillary adenopathy. Abdomen is soft. Has good bowel sounds. There is no fluid wave. There is no palpable liver or spleen tip. Back exam shows a well-healed thoracoscopy scar in the right lateral chest wall. Where he had the chest tube in place just inferior to the thoracoscopy scar is also healing nicely. There is no erythema or warmth over the thoracoscopy site. no tenderness over the spine, ribs or hips. Extremities shows no clubbing, cyanosis or edema. No venous cord is palpable is legs. He has a negative Homans sign. Skin exam shows no rashes, ecchymoses or petechia. Neurological exam is nonfocal.  Lab Results  Component Value Date   WBC 3.0 (L) 06/17/2017   HGB 14.2 06/17/2017   HCT 40.6 06/17/2017   MCV 95 06/17/2017   PLT 109 (L) 06/17/2017     Chemistry      Component Value Date/Time   NA 141 05/21/2017 0749   NA 139 06/27/2016 1410   K 4.4 05/21/2017 0749   K 5.1 06/27/2016 1410   CL 103 05/21/2017 0749   CO2 30 05/21/2017 0749   CO2 28 06/27/2016 1410   BUN 17 05/21/2017 0749   BUN 20.9 06/27/2016 1410   CREATININE 1.6 (H) 05/21/2017 0749   CREATININE 1.6 (H) 06/27/2016 1410      Component Value Date/Time   CALCIUM 9.2 05/21/2017 0749   CALCIUM 9.6 06/27/2016 1410   ALKPHOS 50 05/21/2017 0749   ALKPHOS 62 06/27/2016 1410   AST 24 05/21/2017 0749   AST 18 06/27/2016 1410   ALT 29 05/21/2017 0749   ALT 24 06/27/2016 1410   BILITOT 0.80 05/21/2017 0749   BILITOT 0.46 06/27/2016 1410         Impression and Plan: [name] is 70 year-old African-American gentleman. He has recurrent pulmonary embolism. I think he needs lifelong anticoagulation.He is doing very well on Xarelto. He enjoys being on Xarelto.  IWill proceed with his second and final cycle of treatment. I'm just treating him so  that we will get down his lymphadenopathy so that he will not have any issues weight goes over to [name]. He will leave on September 15.  We will go ahead and get a another CT scan on him the week of his departure. I will get this on September 10. I will see him that day.  I had to believe that his lymphadenopathy will have resolved. The fact that his percent are lymphocytes has improved is a good indicator. He really has done nicely.   Volanda Napoleon, MD 8/7/20188:27 AM

## 2017-06-17 NOTE — Progress Notes (Signed)
OK to treat with ANC 1.1 per [name].   1205-Patient complaining of "wave of dizziness and nausea" which lasted approximately one minute. Bendeka paused. Bendeka had two minutes left to infuse at time of patients complaints.

## 2017-06-17 NOTE — Progress Notes (Signed)
Patient ID: [name], male   DOB: [birth year], 71 y.o.   MRN: [id number]    Cardiology Office Note   Date:  06/18/2017   ID:  [name], DOB [birth year], MRN [id number]  PCP:  [name], MD  Cardiologist:  [name]     No chief complaint on file.    History of Present Illness: [name] is a 71 y.o. male with a hx of severe aortic insufficiency, status post bioprosthetic AVR by [name] at [facility] in 05/2010, normal coronary arteries by cardiac catheterization in 2011, prior DVT, persistent LUE basilic/cephalic thrombus, recurrent pulmonary emboli (most recently 10/2014), PAF, Xarelto anticoagulation, HTN, HL, CLL, prostate CA status post radical prostatectomy, RLL lung nodule. The patient has been followed by [name] with oncology. PET scan has been negative for hypermetabolic activity regarding his lung nodule. However, recent chest CT demonstrated increasing size. Of note, there was resolution of pulmonary emboli. Resection has been recommended. The patient is being referred to a thoracic surgeon at [facility].    04/03/15  Had throascopic RLL wedge resection [place] Turned out to be benign granulomatous nodule And not cancer    Studies/Reports Reviewed Today:  Chest CTA 02/06/15 IMPRESSION: 1. Previously described pulmonary emboli have resolved. There are no new pulmonary emboli. 2. Right lower lobe pulmonary nodule has increased in size. It shows no appreciable hyper metabolism on the recent prior PET-CT. That given the size increase, biopsy should be considered. 3. No acute findings in the lungs. 4. Stable mild cardiomegaly and stable changes from previous cardiac surgery and aortic valve replacement.  Echo  03/19/17 mean gradient 21 mmHg peak 36 mmHg   .Study Conclusions  - Left ventricle: The cavity size was normal. There was moderate   concentric hypertrophy. Systolic function was normal. The   estimated ejection fraction was in the range of 55% to  60%. Wall   motion was normal; there were no regional wall motion   abnormalities. Features are consistent with a pseudonormal left   ventricular filling pattern, with concomitant abnormal relaxation   and increased filling pressure (grade 2 diastolic dysfunction).   Doppler parameters are consistent with indeterminate ventricular   filling pressure. - Aortic valve: A bioprosthesis was present and functioning well.   Mean gradient is mildly elevated but unchanged from 04/02/16.   There was no regurgitation. - Aorta: Ascending aortic diameter: 40 mm (S). - Ascending aorta: The ascending aorta was mildly dilated. - Mitral valve: Transvalvular velocity was within the normal range.   There was no evidence for stenosis. There was mild regurgitation. - Left atrium: The atrium was severely dilated. - Right ventricle: The cavity size was normal. Wall thickness was   normal. Systolic function was normal. - Tricuspid valve: There was mild regurgitation. - Pulmonary arteries: Systolic pressure was within the normal   range.  Upper Ext Venous Duplex 07/2014 Acute superficial thrombus noted in the left mid to distal basilic and mid cephalic vein. All other left upper extremity deep and superficial veins, show no evidence of intraluminal thrombus.  LHC 04/2010 Normal coronary arteries  08/2015  Normal carotid duplex  Reviewed his echo from 03/19/17  . AVR mean gradient 21 mmHg peak 36 mmHg stable .  Also reviewed labs from [place]  Cr 1.7 and LDL 189  Disscussed issues of lipid control for deteriorating tissue valves   Switching primary to Dr Yong Channel . 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 Anticoagulants causing adverse effect in therapeutic use   . Anxiety    Paxil seemed to cause odd neuro side effects;  better on prozac as of 09/2015  . Aortic regurgitation 04/2007 surgery   Resolved with [facility]R at [facility]  . Cervical spondylosis    ESI by [name] in Ortho. 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 Chronic lymphocytic leukemia (CLL), B-cell ([place]) approx 2012   stable on f/u's with [name] (most recent 10/2016)  . History of prostate cancer 2003   Rad prost  . History of pulmonary embolism 2011; 10/2014   Recurrence off of anticoagulation 10/2014: per hem/onc ([name]) pt needs lifelong anticoagulation (hypercoag w/u neg).  . Hyperlipidemia    statin-intolerant, except for crestor low dose.  Marland Kitchen Hypertension   . Migraine syndrome   . Mitral regurgitation   . PAF (paroxysmal atrial fibrillation) ([place])   . Panic attacks    "           "             "                  "                  "                       "                             "                         . Pulmonary nodule, right 2015   RLL: resected at [facility] --VATS; Benign RLL nodule (fungal and AFB stains neg).  Plan is for [facility] thoracic surg to do f/u o/v & CT chest 1 yr   . Right-sided headache 07/2016   Primary stabbing HA or migraine per neuro (Dr. Tomi Likens).  Gabapentin and topamax not tolerated.  These resolved spontaneously.    Past Surgical History:  Procedure Laterality Date  . AORTIC VALVE REPLACEMENT  2011   [facility]  (bioprosthetic)  . CARDIAC CATHETERIZATION  04/2010   Normal coronaries.  . Carotid dopplers  08/2015   NORMAL  . CATARACT EXTRACTION     L 12/06/09   R 09/14/09  . COLONOSCOPY  01/26/13   tic's, o/w normal.  ([place])--recall 10 yrs.  Marland Kitchen PENILE PROSTHESIS IMPLANT    . PROSTATECTOMY  04/2002   for prostate cancer  . Pulmonary nodule resection  Fall/winter 2016   [facility]  . SHOULDER SURGERY     rotator cuff on both arms   . TRANSTHORACIC ECHOCARDIOGRAM  09/20/2014; 03/2016   mild LVH, EF 50-55%, wall motion nl, grade I diast dysfxn, prosth aort valve good,transaortic gradients decreased compared to echo  11/2013.   03/2016: EF 55-60%, normal LV function, severe LVH, normal diast fxn, mild increase in AV gradient compared to 09/2014 echo.     Current Outpatient Prescriptions  Medication Sig Dispense Refill  . diclofenac sodium (VOLTAREN) 1 % GEL Apply 2 g topically as needed.    Marland Kitchen FLUoxetine (PROZAC) 10 MG capsule TAKE 1 CAPSULE (10 MG TOTAL) BY MOUTH  DAILY. 90 capsule 3  . imipramine (TOFRANIL) 10 MG tablet TAKE 1 TABLET (10 MG TOTAL) BY MOUTH AT BEDTIME. 90 tablet 0  . LORazepam (ATIVAN) 0.5 MG tablet TAKE 1 TABLET BY MOUTH EVERY DAY AS NEEDED FOR ANXIETY 30 tablet 5  . metoprolol succinate (TOPROL-XL) 25 MG 24 hr tablet TAKE 1 TABLET BY MOUTH EVERY DAY 30 tablet 8  . rivaroxaban (XARELTO) 20 MG TABS tablet Take 1 tablet (20 mg total) by mouth daily with supper. 90 tablet 1  . rosuvastatin (CRESTOR) 10 MG tablet Take 1 tablet by mouth every Monday, Wednesday, and Friday. 45 tablet 3   No current facility-administered medications for this visit.     Allergies:   Hydrocodone-acetaminophen; Oxycodone hcl; Telmisartan-hctz; Ramipril; Aspirin; Atorvastatin; Buspirone hcl; Codeine; Codeine phosphate; Hydrocodone-acetaminophen; Morphine sulfate; Paroxetine; and Rabeprazole    Social History:  The patient  reports that he quit smoking about 36 years ago. His smoking use included Cigarettes. He started smoking about 48 years ago. He has a 12.00 pack-year smoking history. He has never used smokeless tobacco. He reports that he drinks alcohol. He reports that he does not use drugs.   Family History:  The patient's family history includes Cancer in his sister; Colon cancer (age of onset: 44) in his sister; Prostate cancer in his father; Stroke in his brother, mother, and sister.    ROS:   Please see the history of present illness.   Review of Systems  All other systems reviewed and are negative.     PHYSICAL EXAM: VS:  BP 126/76   Pulse 78   Ht 5\' 10"  (1.778 m)   Wt 208 lb 4 oz (94.5 kg)    SpO2 96%   BMI 29.88 kg/m     Wt Readings from Last 3 Encounters:  06/18/17 208 lb 4 oz (94.5 kg)  06/18/17 206 lb 8 oz (93.7 kg)  06/17/17 204 lb 4 oz (92.6 kg)     GEN: Well nourished, well developed, in no acute distress  HEENT: normal  Neck: no JVD, left  carotid bruits, no masses Cardiac:  Normal S1/accentuated S2, RRR; no murmur ,  no rubs or gallops, no edema  Respiratory:  clear to auscultation bilaterally, no wheezing, rhonchi or rales. GI: soft, nontender, nondistended, + BS MS: no deformity or atrophy  Skin: warm and dry  Neuro:  CNs II-XII intact, Strength and sensation are intact Psych: Normal affect   EKG:   03/22/15  NSR, HR 67, LAFB, RBBB, no change from prior tracing  04/02/16 SR rate 62 LAD RBBB LVH  06/18/17 SR rate 78 LAD RBBB LVH    Recent Labs: 06/17/2017: ALT(SGPT) 22; BUN, Bld 14; Creat 1.4; HGB 14.2; Platelets 109; Potassium 4.1; Sodium 141    Lipid Panel    Component Value Date/Time   CHOL 202 (H) 06/13/2016 0755   TRIG 97.0 06/13/2016 0755   HDL 56.60 06/13/2016 0755   CHOLHDL 4 06/13/2016 0755   VLDL 19.4 06/13/2016 0755   LDLCALC 126 (H) 06/13/2016 0755   LDLDIRECT 169.0 03/30/2013 0936      ASSESSMENT AND PLAN:  Aortic insufficiency S/P Bioprosthetic AVR (aortic valve replacement) in 2011 Increased gradients on echo continue anticoagulation.    History of DVT (deep vein thrombosis) and Recurrent pulmonary embolism Continue Xarelto. This is managed by heme/onc.   Paroxysmal atrial fibrillation Maintaining NSR.  Continue Toprol-XL and Xarelto.  Essential hypertension Borderline control.  Continue to monitor.  HLD (hyperlipidemia) Has had leg pain with  statins in past. Taking crestor 3 x/week   Lung nodule  Post VATS at [facility]  Benign nodule healing well   Carotid Bruit:  Left duplex no stenosis f/u 08/2018  Depression:  Better off paroxetine now on low dose prozac with tofranil  Headache:  F/u primary PRN tylenol ok has schrap  metal on right side of head Cant have MRI  AYO:KHTX with primary typically Cr around 1.7   BBB:  RBBB LAD post AVR ECG q 6 months follow for more advanced AV block   CLL:  F/u with Dr Jonette Eva Finishing round of chemo this month counts down Lab Results  Component Value Date   WBC 3.0 (L) 06/17/2017   HGB 14.2 06/17/2017   HCT 40.6 06/17/2017   MCV 95 06/17/2017   PLT 109 (L) 06/17/2017      Current medicines are reviewed at length with the patient today.  Concerns regarding medicines are as outlined above.  The following changes have been made:       Jenkins Rouge

## 2017-06-18 ENCOUNTER — Encounter: Payer: Self-pay | Admitting: Cardiovascular Disease

## 2017-06-18 ENCOUNTER — Ambulatory Visit (INDEPENDENT_AMBULATORY_CARE_PROVIDER_SITE_OTHER): Payer: Medicare Other | Admitting: Cardiovascular Disease

## 2017-06-18 ENCOUNTER — Ambulatory Visit (HOSPITAL_BASED_OUTPATIENT_CLINIC_OR_DEPARTMENT_OTHER): Payer: Medicare Other

## 2017-06-18 VITALS — BP 136/80 | HR 93 | Temp 97.7°F | Resp 16 | Wt 206.5 lb

## 2017-06-18 VITALS — BP 126/76 | HR 78 | Ht 70.0 in | Wt 208.2 lb

## 2017-06-18 DIAGNOSIS — Z5111 Encounter for antineoplastic chemotherapy: Secondary | ICD-10-CM | POA: Diagnosis not present

## 2017-06-18 DIAGNOSIS — C9110 Chronic lymphocytic leukemia of B-cell type not having achieved remission: Secondary | ICD-10-CM | POA: Diagnosis not present

## 2017-06-18 DIAGNOSIS — I480 Paroxysmal atrial fibrillation: Secondary | ICD-10-CM

## 2017-06-18 MED ORDER — DEXAMETHASONE SODIUM PHOSPHATE 10 MG/ML IJ SOLN
INTRAMUSCULAR | Status: AC
  Filled 2017-06-18: qty 1

## 2017-06-18 MED ORDER — SODIUM CHLORIDE 0.9 % IV SOLN
Freq: Once | INTRAVENOUS | Status: AC
Start: 2017-06-18 — End: 2017-06-18
  Administered 2017-06-18: 09:00:00 via INTRAVENOUS

## 2017-06-18 MED ORDER — SODIUM CHLORIDE 0.9 % IV SOLN
80.0000 mg/m2 | Freq: Once | INTRAVENOUS | Status: AC
  Administered 2017-06-18: 175 mg via INTRAVENOUS
  Filled 2017-06-18: qty 7

## 2017-06-18 MED ORDER — DEXAMETHASONE SODIUM PHOSPHATE 10 MG/ML IJ SOLN
10.0000 mg | Freq: Once | INTRAMUSCULAR | Status: AC
  Administered 2017-06-18: 10 mg via INTRAVENOUS

## 2017-06-18 NOTE — Patient Instructions (Signed)
Medication Instructions:  Your physician recommends that you continue on your current medications as directed. Please refer to the Current Medication list given to you today.   Labwork: None ordered  Testing/Procedures: None ordered  Follow-Up: Your physician wants you to follow-up in: 6 months with Dr. Nishan. You will receive a reminder letter in the mail two months in advance. If you don't receive a letter, please call our office to schedule the follow-up appointment.   Any Other Special Instructions Will Be Listed Below (If Applicable).     If you need a refill on your cardiac medications before your next appointment, please call your pharmacy.   

## 2017-06-18 NOTE — Patient Instructions (Signed)

## 2017-06-19 ENCOUNTER — Encounter: Payer: Self-pay | Admitting: Hematology & Oncology

## 2017-06-19 ENCOUNTER — Ambulatory Visit (INDEPENDENT_AMBULATORY_CARE_PROVIDER_SITE_OTHER): Payer: 59 | Admitting: Psychology

## 2017-06-19 DIAGNOSIS — F4321 Adjustment disorder with depressed mood: Secondary | ICD-10-CM | POA: Diagnosis not present

## 2017-06-23 ENCOUNTER — Encounter: Payer: Self-pay | Admitting: Family Medicine

## 2017-07-09 ENCOUNTER — Ambulatory Visit: Payer: Self-pay | Admitting: Hematology & Oncology

## 2017-07-09 ENCOUNTER — Other Ambulatory Visit: Payer: Self-pay | Admitting: Family Medicine

## 2017-07-09 ENCOUNTER — Other Ambulatory Visit: Payer: Self-pay

## 2017-07-11 ENCOUNTER — Telehealth: Payer: Self-pay | Admitting: Family Medicine

## 2017-07-11 ENCOUNTER — Other Ambulatory Visit: Payer: Self-pay | Admitting: Family Medicine

## 2017-07-11 ENCOUNTER — Other Ambulatory Visit: Payer: Self-pay

## 2017-07-11 MED ORDER — IMIPRAMINE HCL 10 MG PO TABS: 10.0000 mg | ORAL_TABLET | Freq: Every day | ORAL | 0 refills | Status: DC

## 2017-07-11 NOTE — Telephone Encounter (Signed)
Reviewed prescription again and realized transmission to pharmacy failed so I sent the prescription in to the pharmacy

## 2017-07-11 NOTE — Telephone Encounter (Signed)
Pt need new Rx for imipramine  [facility] [address] [facility] [address]

## 2017-07-11 NOTE — Telephone Encounter (Signed)
Patient has been seen in a year. Medication not filled. Sent message to patient in my chart.

## 2017-07-11 NOTE — Telephone Encounter (Signed)
Prescription was filled by Dr. Anitra Lauth yesterday

## 2017-07-16 ENCOUNTER — Ambulatory Visit: Payer: Self-pay

## 2017-07-16 ENCOUNTER — Other Ambulatory Visit: Payer: Self-pay

## 2017-07-16 ENCOUNTER — Ambulatory Visit: Payer: Self-pay | Admitting: Hematology & Oncology

## 2017-07-17 ENCOUNTER — Ambulatory Visit (INDEPENDENT_AMBULATORY_CARE_PROVIDER_SITE_OTHER): Payer: Medicare Other | Admitting: Psychology

## 2017-07-17 ENCOUNTER — Ambulatory Visit: Payer: Self-pay

## 2017-07-17 DIAGNOSIS — F4321 Adjustment disorder with depressed mood: Secondary | ICD-10-CM | POA: Diagnosis not present

## 2017-07-22 ENCOUNTER — Other Ambulatory Visit (HOSPITAL_BASED_OUTPATIENT_CLINIC_OR_DEPARTMENT_OTHER): Payer: Medicare Other

## 2017-07-22 ENCOUNTER — Ambulatory Visit (HOSPITAL_BASED_OUTPATIENT_CLINIC_OR_DEPARTMENT_OTHER): Payer: Medicare Other | Admitting: Hematology & Oncology

## 2017-07-22 ENCOUNTER — Encounter (HOSPITAL_BASED_OUTPATIENT_CLINIC_OR_DEPARTMENT_OTHER): Payer: Self-pay

## 2017-07-22 ENCOUNTER — Other Ambulatory Visit: Payer: Self-pay

## 2017-07-22 ENCOUNTER — Ambulatory Visit (HOSPITAL_BASED_OUTPATIENT_CLINIC_OR_DEPARTMENT_OTHER)
Admission: RE | Admit: 2017-07-22 | Discharge: 2017-07-22 | Disposition: A | Discharge status: Home / Self Care | Payer: Medicare Other | Source: Ambulatory Visit | Attending: Hematology & Oncology | Admitting: Hematology & Oncology

## 2017-07-22 VITALS — BP 118/77 | HR 74 | Temp 97.7°F | Resp 16 | Wt 205.0 lb

## 2017-07-22 DIAGNOSIS — I2699 Other pulmonary embolism without acute cor pulmonale: Secondary | ICD-10-CM | POA: Diagnosis not present

## 2017-07-22 DIAGNOSIS — C9110 Chronic lymphocytic leukemia of B-cell type not having achieved remission: Secondary | ICD-10-CM

## 2017-07-22 DIAGNOSIS — Z952 Presence of prosthetic heart valve: Secondary | ICD-10-CM | POA: Insufficient documentation

## 2017-07-22 DIAGNOSIS — R590 Localized enlarged lymph nodes: Secondary | ICD-10-CM | POA: Insufficient documentation

## 2017-07-22 DIAGNOSIS — R911 Solitary pulmonary nodule: Secondary | ICD-10-CM

## 2017-07-22 DIAGNOSIS — C9190 Lymphoid leukemia, unspecified not having achieved remission: Secondary | ICD-10-CM | POA: Diagnosis not present

## 2017-07-22 DIAGNOSIS — Z960 Presence of urogenital implants: Secondary | ICD-10-CM | POA: Insufficient documentation

## 2017-07-22 DIAGNOSIS — I700 Atherosclerosis of aorta: Secondary | ICD-10-CM | POA: Diagnosis not present

## 2017-07-22 DIAGNOSIS — Z9079 Acquired absence of other genital organ(s): Secondary | ICD-10-CM | POA: Insufficient documentation

## 2017-07-22 DIAGNOSIS — J984 Other disorders of lung: Secondary | ICD-10-CM | POA: Insufficient documentation

## 2017-07-22 DIAGNOSIS — Z7901 Long term (current) use of anticoagulants: Secondary | ICD-10-CM | POA: Diagnosis not present

## 2017-07-22 LAB — CBC WITH DIFFERENTIAL (CANCER CENTER ONLY)
BASO#: 0 10*3/uL (ref 0.0–0.2)
BASO%: 0.7 % (ref 0.0–2.0)
EOS%: 3.1 % (ref 0.0–7.0)
Eosinophils Absolute: 0.1 10*3/uL (ref 0.0–0.5)
HCT: 41.2 % (ref 38.7–49.9)
HGB: 14.3 g/dL (ref 13.0–17.1)
LYMPH#: 1 10*3/uL (ref 0.9–3.3)
LYMPH%: 33.8 % (ref 14.0–48.0)
MCH: 33.1 pg (ref 28.0–33.4)
MCHC: 34.7 g/dL (ref 32.0–35.9)
MCV: 95 fL (ref 82–98)
MONO#: 0.6 10*3/uL (ref 0.1–0.9)
MONO%: 21.2 % — ABNORMAL HIGH (ref 0.0–13.0)
NEUT#: 1.2 10*3/uL — ABNORMAL LOW (ref 1.5–6.5)
NEUT%: 41.2 % (ref 40.0–80.0)
Platelets: 131 10*3/uL — ABNORMAL LOW (ref 145–400)
RBC: 4.32 10*6/uL (ref 4.20–5.70)
RDW: 12.5 % (ref 11.1–15.7)
WBC: 2.9 10*3/uL — ABNORMAL LOW (ref 4.0–10.0)

## 2017-07-22 LAB — CMP (CANCER CENTER ONLY)
ALT(SGPT): 24 U/L (ref 10–47)
AST: 23 U/L (ref 11–38)
Albumin: 3.7 g/dL (ref 3.3–5.5)
Alkaline Phosphatase: 59 U/L (ref 26–84)
BUN, Bld: 17 mg/dL (ref 7–22)
CO2: 30 mEq/L (ref 18–33)
Calcium: 9.6 mg/dL (ref 8.0–10.3)
Chloride: 105 mEq/L (ref 98–108)
Creat: 1.6 mg/dl — ABNORMAL HIGH (ref 0.6–1.2)
Glucose, Bld: 98 mg/dL (ref 73–118)
Potassium: 4.3 mEq/L (ref 3.3–4.7)
Sodium: 145 mEq/L (ref 128–145)
Total Bilirubin: 0.7 mg/dl (ref 0.20–1.60)
Total Protein: 6.7 g/dL (ref 6.4–8.1)

## 2017-07-22 LAB — LACTATE DEHYDROGENASE: LDH: 330 U/L — ABNORMAL HIGH (ref 125–245)

## 2017-07-22 LAB — CHCC SATELLITE - SMEAR

## 2017-07-22 MED ORDER — IOPAMIDOL (ISOVUE-300) INJECTION 61%
100.0000 mL | Freq: Once | INTRAVENOUS | Status: AC | PRN
  Administered 2017-07-22: 80 mL via INTRAVENOUS
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
## 2017-07-23 LAB — IGG, IGA, IGM
IgA, Qn, Serum: 139 mg/dL (ref 61–437)
IgG, Qn, Serum: 771 mg/dL (ref 700–1600)
IgM, Qn, Serum: 91 mg/dL (ref 15–143)

## 2017-07-27 ENCOUNTER — Encounter: Payer: Self-pay | Admitting: Family Medicine

## 2017-08-11 ENCOUNTER — Ambulatory Visit (INDEPENDENT_AMBULATORY_CARE_PROVIDER_SITE_OTHER): Payer: Medicare Other | Admitting: Family Medicine

## 2017-08-11 ENCOUNTER — Encounter: Payer: Self-pay | Admitting: Family Medicine

## 2017-08-11 VITALS — BP 130/82 | HR 73 | Temp 98.3°F | Ht 70.0 in | Wt 201.4 lb

## 2017-08-11 DIAGNOSIS — C9190 Lymphoid leukemia, unspecified not having achieved remission: Secondary | ICD-10-CM | POA: Diagnosis not present

## 2017-08-11 DIAGNOSIS — F411 Generalized anxiety disorder: Secondary | ICD-10-CM | POA: Diagnosis not present

## 2017-08-11 DIAGNOSIS — N183 Chronic kidney disease, stage 3 unspecified: Secondary | ICD-10-CM | POA: Insufficient documentation

## 2017-08-11 DIAGNOSIS — I10 Essential (primary) hypertension: Secondary | ICD-10-CM | POA: Diagnosis not present

## 2017-08-11 DIAGNOSIS — N1832 Chronic kidney disease, stage 3b: Secondary | ICD-10-CM | POA: Insufficient documentation

## 2017-08-11 DIAGNOSIS — Z0000 Encounter for general adult medical examination without abnormal findings: Secondary | ICD-10-CM | POA: Diagnosis not present

## 2017-08-11 DIAGNOSIS — C9110 Chronic lymphocytic leukemia of B-cell type not having achieved remission: Secondary | ICD-10-CM

## 2017-08-11 DIAGNOSIS — E785 Hyperlipidemia, unspecified: Secondary | ICD-10-CM | POA: Diagnosis not present

## 2017-08-11 DIAGNOSIS — Z23 Encounter for immunization: Secondary | ICD-10-CM

## 2017-08-11 DIAGNOSIS — G43909 Migraine, unspecified, not intractable, without status migrainosus: Secondary | ICD-10-CM

## 2017-08-11 DIAGNOSIS — I480 Paroxysmal atrial fibrillation: Secondary | ICD-10-CM

## 2017-08-11 LAB — LIPID PANEL
Cholesterol: 222 mg/dL — ABNORMAL HIGH (ref 0–200)
HDL: 44.4 mg/dL (ref >39.00)
LDL Cholesterol: 146 mg/dL — ABNORMAL HIGH (ref 0–99)
NonHDL: 178.09
Total CHOL/HDL Ratio: 5
Triglycerides: 162 mg/dL — ABNORMAL HIGH (ref 0.0–149.0)
VLDL: 32.4 mg/dL (ref 0.0–40.0)

## 2017-08-11 LAB — URINALYSIS
Bilirubin Urine: NEGATIVE
Hgb urine dipstick: NEGATIVE
Ketones, ur: NEGATIVE
Leukocytes, UA: NEGATIVE
Nitrite: NEGATIVE
Specific Gravity, Urine: 1.01 (ref 1.000–1.030)
Total Protein, Urine: NEGATIVE
Urine Glucose: NEGATIVE
Urobilinogen, UA: 0.2 (ref 0.0–1.0)
pH: 7 (ref 5.0–8.0)

## 2017-08-11 MED ORDER — RIVAROXABAN 20 MG PO TABS: 20.0000 mg | ORAL_TABLET | Freq: Every day | ORAL | 3 refills | Status: DC

## 2017-08-11 NOTE — Assessment & Plan Note (Signed)
stable on prozac 10mg  and imipramine (helps with sleep)-  Uses sparing ativan for anxiety/sleep as backup

## 2017-08-11 NOTE — Assessment & Plan Note (Signed)
Migraine prevention- on  imipramine 10mg 

## 2017-08-11 NOTE — Assessment & Plan Note (Signed)
HTN- controlled on 12.5mg  metoprolol XL.

## 2017-08-11 NOTE — Patient Instructions (Addendum)
Flu shot today  Td (tetanus next visit) or if you were to get a cut/scrape  Please stop by lab before you go

## 2017-08-11 NOTE — Assessment & Plan Note (Signed)
HLD- compliant with crestor 10mg  MWF. Some myalgias- unable to get LDL below 100 without worsening symptoms so maintaining current dose.

## 2017-08-11 NOTE — Assessment & Plan Note (Signed)
Paroxysmal a fib- rate control agent on board with metoprolol 12.5mg  XL but actually in NSR. Compliant with xarelto for anticoagulation. Also with history recurrent PE so on lifelong therapy. Asks about getting xarelto through the [place]- printed Rx to see if an option but they will likely have to put in the Rx themselves

## 2017-08-11 NOTE — Progress Notes (Signed)
Phone: [phone]  Subjective:  Patient presents today for their annual physical. Chief complaint-noted.   See problem oriented charting- ROS- full  review of systems was completed and negative except for: fatigue. No chest pain or shortness of breath. Occasional headache.    The following were reviewed and entered/updated in epic: Past Medical History:  Diagnosis Date  . Anticoagulants causing adverse effect in therapeutic use   . Anxiety    Paxil seemed to cause odd neuro side effects; better on prozac as of 09/2015  . Aortic regurgitation 04/2007 surgery   Resolved with tissue AVR at [facility]  . Cervical spondylosis    ESI by [name] in Ortho. 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 Chronic lymphocytic leukemia (CLL), B-cell ([place]) approx 2012   stable on f/u's with [name] (most recent 07/2017)  . History of prostate cancer 2003   Rad prost  . History of pulmonary embolism 2011; 10/2014   Recurrence off of anticoagulation 10/2014: per hem/onc ([name]) pt needs lifelong anticoagulation (hypercoag w/u neg).  . Hyperlipidemia    statin-intolerant, except for crestor low dose.  Marland Kitchen Hypertension   . Migraine syndrome   . Mitral regurgitation   . PAF (paroxysmal atrial fibrillation) ([place])   . Panic attacks    "           "             "                  "                  "                       "                             "                         . Pulmonary nodule, right 2015   RLL: resected at [facility]; Benign RLL nodule (fungal and AFB stains neg).  Plan is for [facility] thoracic surg to do f/u o/v & CT chest 1 yr   . Right-sided headache 07/2016   Primary stabbing HA or migraine per neuro (Dr. Tomi Likens).  Gabapentin and topamax not tolerated.  These resolved spontaneously.   Patient Active Problem List   Diagnosis Date Noted  . S/P Bioprosthetic AVR (aortic valve replacement) in 2011 03/22/2015    Priority: High  . Recurrent pulmonary embolism ([place]) 11/09/2014    Priority:  High  . Chronic lymphocytic leukemia ([place]) 11/11/2013    Priority: High  . Atrial fibrillation ([place]) 04/20/2007    Priority: High  . PROSTATE CANCER, HX OF 04/20/2007    Priority: High  . Cervical spondylosis     Priority: Medium  . Migraine 10/08/2010    Priority: Medium  . Insomnia 02/03/2008    Priority: Medium  . Anxiety state 09/06/2007    Priority: Medium  . HLD (hyperlipidemia) 04/20/2007    Priority: Medium  . Essential hypertension 04/20/2007    Priority: Medium  . Former smoker 08/29/2016    Priority: Low  . Lung nodule 11/02/2014    Priority: Low  . GERD 12/11/2009    Priority: Low  . MITRAL REGURGITATION 04/07/2009    Priority: Low  . CKD (chronic kidney disease), stage III (  HCC) 08/11/2017   Past Surgical History:  Procedure Laterality Date  . AORTIC VALVE REPLACEMENT  2011   [facility]  (bioprosthetic)  . CARDIAC CATHETERIZATION  04/2010   Normal coronaries.  . Carotid dopplers  08/2015   NORMAL  . CATARACT EXTRACTION     L 12/06/09   R 09/14/09  . COLONOSCOPY  01/26/13   tic's, o/w normal.  ([place])--recall 10 yrs.  Marland Kitchen PENILE PROSTHESIS IMPLANT    . PROSTATECTOMY  04/2002   for prostate cancer  . Pulmonary nodule resection  Fall/winter 2016   [facility]  . SHOULDER SURGERY     rotator cuff on both arms   . TRANSTHORACIC ECHOCARDIOGRAM  09/20/2014; 03/2016   mild LVH, EF 50-55%, wall motion nl, grade I diast dysfxn, prosth aort valve good,transaortic gradients decreased compared to echo 11/2013.   03/2016: EF 55-60%, normal LV function, severe LVH, normal diast fxn, mild increase in AV gradient compared to 09/2014 echo.    Family History  Problem Relation Age of Onset  . Cancer Sister        uterine  . Colon cancer Sister 30  . Stroke Mother   . Prostate cancer Father   . Stroke Sister   . Stroke Brother   . Heart attack Neg Hx     Medications- reviewed and updated Current Outpatient Prescriptions  Medication Sig Dispense Refill  . diclofenac sodium  (VOLTAREN) 1 % GEL Apply 2 g topically as needed.    Marland Kitchen FLUoxetine (PROZAC) 10 MG capsule TAKE 1 CAPSULE (10 MG TOTAL) BY MOUTH DAILY. 90 capsule 3  . imipramine (TOFRANIL) 10 MG tablet Take 1 tablet (10 mg total) by mouth at bedtime. 90 tablet 0  . LORazepam (ATIVAN) 0.5 MG tablet TAKE 1 TABLET BY MOUTH EVERY DAY AS NEEDED FOR ANXIETY 30 tablet 5  . metoprolol succinate (TOPROL-XL) 25 MG 24 hr tablet TAKE 1 TABLET BY MOUTH EVERY DAY 30 tablet 8  . rivaroxaban (XARELTO) 20 MG TABS tablet Take 1 tablet (20 mg total) by mouth daily with supper. 90 tablet 3  . rosuvastatin (CRESTOR) 10 MG tablet Take 1 tablet by mouth every Monday, Wednesday, and Friday. 45 tablet 3   No current facility-administered medications for this visit.     Allergies-reviewed and updated Allergies  Allergen Reactions  . Hydrocodone-Acetaminophen Shortness Of Breath  . Oxycodone Hcl Shortness Of Breath  . Telmisartan-Hctz Shortness Of Breath and Palpitations    Dizziness, too  . Ramipril   . Aspirin Other (See Comments) and Rash    Other Reaction: confusion Can't take high dose  . Atorvastatin Other (See Comments)    myalgia  . Buspirone Hcl Other (See Comments)    headache  . Codeine Nausea Only and Other (See Comments)    Other Reaction: confusion  . Codeine Phosphate Itching and Nausea Only  . Hydrocodone-Acetaminophen Nausea Only and Other (See Comments)    Other Reaction: confusion  . Morphine Sulfate Itching  . Paroxetine Other (See Comments)    Paresthesias: R arm, R leg, r side of face  . Rabeprazole Other (See Comments)    headache    Social History   Social History  . Marital status: Single    Spouse name: N/A  . Number of children: N/A  . Years of education: N/A   Social History Main Topics  . Smoking status: Former Smoker    Packs/day: 1.00    Years: 12.00    Types: Cigarettes    Start date: 11/28/1968  Quit date: 01/29/1981  . Smokeless tobacco: Never Used     Comment: quit 71 yo    . Alcohol use 0.0 oz/week     Comment: occasional  . Drug use: No  . Sexual activity: Not on file   Other Topics Concern  . Not on file   Social History Narrative   Single. No children. Lives alone      Retired from [employer].       Hobbies: travel, Scientist, product/process development, enjoys being outdoors including hiking   Objective: BP 130/82 (BP Location: Left Arm, Patient Position: Sitting, Cuff Size: Large)   Pulse 73   Temp 98.3 F (36.8 C) (Oral)   Ht '5\' 10"'$  (1.778 m)   Wt 201 lb 6.4 oz (91.4 kg)   SpO2 100%   BMI 28.90 kg/m  Gen: NAD, resting comfortably HEENT: Mucous membranes are moist. Oropharynx normal Neck: no thyromegaly. No lymphadenopathy CV: RRR no murmurs rubs or gallops Lungs: CTAB no crackles, wheeze, rhonchi Abdomen: soft/nontender/nondistended/normal bowel sounds. No rebound or guarding.  Ext: no edema Skin: warm, dry Neuro: grossly normal, moves all extremities, PERRLA  Assessment/Plan:  71 y.o. male presenting for annual physical.  Health Maintenance counseling: 1. Anticipatory guidance: Patient counseled regarding regular dental exams q6 months, eye exams - yearly at [facility], wearing seatbelts.  2. Risk factor reduction:  Advised patient of need for regular exercise and diet rich and fruits and vegetables to reduce risk of heart attack and stroke. Exercise- 3 days a week. Diet-maintaining weight- balanced diet. Home scales closer to 195- he wants to maintain this or lose slightly.  Wt Readings from Last 3 Encounters:  08/11/17 201 lb 6.4 oz (91.4 kg)  07/22/17 205 lb (93 kg)  06/18/17 208 lb 4 oz (94.5 kg)  3. Immunizations/screenings/ancillary studies- discussed due for Td and flu shot . Flu today. Td later date.  Immunization History  Administered Date(s) Administered  . Influenza Split 08/16/2011, 08/14/2012  . Influenza Whole 08/17/2010  . Influenza, High Dose Seasonal PF 08/23/2013, 08/29/2016, 08/11/2017  . Influenza,inj,Quad PF,6+  Mos 09/05/2014, 08/02/2015  . Pneumococcal Conjugate-13 01/23/2015  . Pneumococcal Polysaccharide-23 03/15/2011  . Td 04/16/2007  . Zoster 08/16/2011      4. Prostate cancer screening- does not follow with urology regularly- referred back recently. Prostatectomy 2008. Decision had been made to stop PSA with Dr. Anitra Lauth- he is going to see urology again and they will decide if repeat worthwhile.   Component Value Date   PSA 0.01 (L) 11/16/2014   PSA 0.00 (L) 03/30/2013   PSA 0.00 Repeated and verified X2. (L) 03/22/2011   5. Colon caner screening - 01/26/13 with 10 year follow up  Status of chronic or acute concerns   bioprosthetic aortic valve. Monitored by echo.   Plantar fasciitis- working with Dr. Conan Bowens of ortho. Getting in is way somewhat. He is very active so this is tough for him.   Migraine Migraine prevention- on  imipramine '10mg'$   Chronic lymphocytic leukemia ([place]) CLL- follows with oncology- recent short teatment before his trip to [name]. Some lingering fatigue from this.   Atrial fibrillation (HCC) Paroxysmal a fib- rate control agent on board with metoprolol 12.'5mg'$  XL but actually in NSR. Compliant with xarelto for anticoagulation. Also with history recurrent PE so on lifelong therapy. Asks about getting xarelto through the [place]- printed Rx to see if an option but they will likely have to put in the Rx themselves   CKD (chronic kidney disease), stage  III ([place]) CKD III - [facility] also following. Largely stable in 1.3-1.6 range. Updated 2 weeks ago so will not monitor.   Anxiety state stable on prozac '10mg'$  and imipramine (helps with sleep)-  Uses sparing ativan for anxiety/sleep as backup  HLD (hyperlipidemia) HLD- compliant with crestor '10mg'$  MWF. Some myalgias- unable to get LDL below 100 without worsening symptoms so maintaining current dose.   Essential hypertension HTN- controlled on 12.'5mg'$  metoprolol XL.   Thinking about an [place] trip. Had a great trip to  [name]. Plans to do next trip alone  Future Appointments Date Time Provider [place]  08/14/2017 1:00 PM Oren Binet, PhD [facility] None  09/11/2017 1:00 PM Oren Binet, PhD [facility] None  10/09/2017 1:00 PM Oren Binet, PhD [facility] None  11/06/2017 1:00 PM Oren Binet, PhD [facility] None  12/04/2017 1:00 PM Oren Binet, PhD [facility] None  01/01/2018 1:00 PM Oren Binet, PhD [facility] None  01/29/2018 1:00 PM Oren Binet, PhD [facility] None  02/26/2018 1:00 PM Oren Binet, PhD [facility] None  03/26/2018 1:00 PM Oren Binet, PhD [facility] None  04/23/2018 1:00 PM Oren Binet, PhD [facility] None  05/21/2018 1:00 PM Oren Binet, PhD [facility] None   Yearly exam unless needs [name] sooner as also following at the [place].   Orders Placed This Encounter  Procedures  . Flu vaccine HIGH DOSE PF  . Lipid panel    [place]    Order Specific Question:   Has the patient fasted?    Answer:   No  . Urinalysis    Standing Status:   Future    Number of Occurrences:   1    Standing Expiration Date:   08/11/2018   Meds ordered this encounter  Medications  . rivaroxaban (XARELTO) 20 MG TABS tablet    Sig: Take 1 tablet (20 mg total) by mouth daily with supper.    Dispense:  90 tablet    Refill:  3   Return precautions advised.  Garret Reddish, MD

## 2017-08-11 NOTE — Assessment & Plan Note (Signed)
CKD III - [facility] also following. Largely stable in 1.3-1.6 range. Updated 2 weeks ago so will not monitor.

## 2017-08-11 NOTE — Assessment & Plan Note (Signed)
CLL- follows with oncology- recent short teatment before his trip to [name]. Some lingering fatigue from this.

## 2017-08-13 ENCOUNTER — Ambulatory Visit: Payer: Self-pay

## 2017-08-13 ENCOUNTER — Other Ambulatory Visit: Payer: Self-pay | Admitting: Family Medicine

## 2017-08-13 ENCOUNTER — Ambulatory Visit: Payer: Self-pay | Admitting: Hematology & Oncology

## 2017-08-13 ENCOUNTER — Other Ambulatory Visit: Payer: Self-pay

## 2017-08-14 ENCOUNTER — Ambulatory Visit (INDEPENDENT_AMBULATORY_CARE_PROVIDER_SITE_OTHER): Payer: Medicare Other | Admitting: Psychology

## 2017-08-14 ENCOUNTER — Ambulatory Visit: Payer: Self-pay

## 2017-08-14 DIAGNOSIS — F4321 Adjustment disorder with depressed mood: Secondary | ICD-10-CM | POA: Diagnosis not present

## 2017-08-18 ENCOUNTER — Telehealth: Payer: Self-pay | Admitting: Family Medicine

## 2017-08-18 NOTE — Telephone Encounter (Signed)
Patient called in reference to Rx for LORazepam (ATIVAN) 0.5 MG tablet. I saw in patient's chart this was printed today but did not see it ready for patient to pick up. Please call patient and advise.

## 2017-08-19 ENCOUNTER — Encounter: Payer: Self-pay | Admitting: Family Medicine

## 2017-08-19 NOTE — Telephone Encounter (Signed)
Prescription was faxed to the pharmacy.

## 2017-08-28 ENCOUNTER — Telehealth: Payer: Self-pay | Admitting: Cardiovascular Disease

## 2017-08-28 DIAGNOSIS — E7800 Pure hypercholesterolemia, unspecified: Secondary | ICD-10-CM

## 2017-08-28 MED ORDER — ROSUVASTATIN CALCIUM 10 MG PO TABS: ORAL_TABLET | ORAL | 9 refills | Status: DC

## 2017-08-28 NOTE — Telephone Encounter (Signed)
°*  STAT* If patient is at the pharmacy, call can be transferred to refill team.   1. Which medications need to be refilled? (please list name of each medication and dose if known) rosuvatation 10mg   2. Which pharmacy/location (including street and city if local pharmacy) is medication to be sent to? [facility] [address]. Do they need a 30 day or 90 day supply? [place]

## 2017-08-28 NOTE — Telephone Encounter (Signed)
Pt's medication was sent to pt's pharmacy as requested. Confirmation received.  °

## 2017-09-01 ENCOUNTER — Other Ambulatory Visit: Payer: Self-pay | Admitting: Family Medicine

## 2017-09-01 ENCOUNTER — Encounter: Payer: Self-pay | Admitting: Hematology & Oncology

## 2017-09-01 DIAGNOSIS — I6523 Occlusion and stenosis of bilateral carotid arteries: Secondary | ICD-10-CM

## 2017-09-02 ENCOUNTER — Encounter (HOSPITAL_COMMUNITY): Payer: Self-pay

## 2017-09-02 ENCOUNTER — Emergency Department (HOSPITAL_COMMUNITY): Payer: Medicare Other

## 2017-09-02 ENCOUNTER — Emergency Department (HOSPITAL_COMMUNITY)
Admission: EM | Admit: 2017-09-02 | Discharge: 2017-09-02 | Disposition: A | Discharge status: Home / Self Care | Payer: Medicare Other | Attending: Emergency Medicine | Admitting: Emergency Medicine

## 2017-09-02 ENCOUNTER — Other Ambulatory Visit: Payer: Self-pay

## 2017-09-02 DIAGNOSIS — R42 Dizziness and giddiness: Secondary | ICD-10-CM

## 2017-09-02 DIAGNOSIS — Z86711 Personal history of pulmonary embolism: Secondary | ICD-10-CM | POA: Insufficient documentation

## 2017-09-02 DIAGNOSIS — Z7901 Long term (current) use of anticoagulants: Secondary | ICD-10-CM | POA: Insufficient documentation

## 2017-09-02 DIAGNOSIS — Z79899 Other long term (current) drug therapy: Secondary | ICD-10-CM | POA: Insufficient documentation

## 2017-09-02 DIAGNOSIS — Z87891 Personal history of nicotine dependence: Secondary | ICD-10-CM | POA: Diagnosis not present

## 2017-09-02 DIAGNOSIS — N183 Chronic kidney disease, stage 3 (moderate): Secondary | ICD-10-CM | POA: Insufficient documentation

## 2017-09-02 DIAGNOSIS — I480 Paroxysmal atrial fibrillation: Secondary | ICD-10-CM | POA: Insufficient documentation

## 2017-09-02 DIAGNOSIS — I129 Hypertensive chronic kidney disease with stage 1 through stage 4 chronic kidney disease, or unspecified chronic kidney disease: Secondary | ICD-10-CM | POA: Insufficient documentation

## 2017-09-02 DIAGNOSIS — Z8546 Personal history of malignant neoplasm of prostate: Secondary | ICD-10-CM | POA: Diagnosis not present

## 2017-09-02 LAB — COMPREHENSIVE METABOLIC PANEL
ALT: 21 U/L (ref 17–63)
AST: 21 U/L (ref 15–41)
Albumin: 4.2 g/dL (ref 3.5–5.0)
Alkaline Phosphatase: 60 U/L (ref 38–126)
Anion gap: 8 (ref 5–15)
BUN: 16 mg/dL (ref 6–20)
CO2: 26 mmol/L (ref 22–32)
Calcium: 9.3 mg/dL (ref 8.9–10.3)
Chloride: 102 mmol/L (ref 101–111)
Creatinine, Ser: 1.28 mg/dL — ABNORMAL HIGH (ref 0.61–1.24)
GFR calc Af Amer: >60 mL/min (ref >60)
GFR calc non Af Amer: 55 mL/min — ABNORMAL LOW (ref >60)
Glucose, Bld: 110 mg/dL — ABNORMAL HIGH (ref 65–99)
Potassium: 4.8 mmol/L (ref 3.5–5.1)
Sodium: 136 mmol/L (ref 135–145)
Total Bilirubin: 0.4 mg/dL (ref 0.3–1.2)
Total Protein: 6.6 g/dL (ref 6.5–8.1)

## 2017-09-02 LAB — DIFFERENTIAL
Basophils Absolute: 0 10*3/uL (ref 0.0–0.1)
Basophils Relative: 1 %
Eosinophils Absolute: 0.1 10*3/uL (ref 0.0–0.7)
Eosinophils Relative: 4 %
Lymphocytes Relative: 42 %
Lymphs Abs: 1.3 10*3/uL (ref 0.7–4.0)
Monocytes Absolute: 0.6 10*3/uL (ref 0.1–1.0)
Monocytes Relative: 20 %
Neutro Abs: 1 10*3/uL — ABNORMAL LOW (ref 1.7–7.7)
Neutrophils Relative %: 33 %

## 2017-09-02 LAB — PROTIME-INR
INR: 1.09
Prothrombin Time: 14 seconds (ref 11.4–15.2)

## 2017-09-02 LAB — CBC
HCT: 43.9 % (ref 39.0–52.0)
Hemoglobin: 14.6 g/dL (ref 13.0–17.0)
MCH: 31.5 pg (ref 26.0–34.0)
MCHC: 33.3 g/dL (ref 30.0–36.0)
MCV: 94.8 fL (ref 78.0–100.0)
Platelets: 165 10*3/uL (ref 150–400)
RBC: 4.63 MIL/uL (ref 4.22–5.81)
RDW: 12.8 % (ref 11.5–15.5)
WBC: 3 10*3/uL — ABNORMAL LOW (ref 4.0–10.5)

## 2017-09-02 LAB — APTT: aPTT: 32 seconds (ref 24–36)

## 2017-09-02 MED ORDER — MECLIZINE HCL 25 MG PO TABS: 25.0000 mg | ORAL_TABLET | Freq: Three times a day (TID) | ORAL | 0 refills | Status: DC | PRN

## 2017-09-02 MED ORDER — MECLIZINE HCL 25 MG PO TABS
25.0000 mg | ORAL_TABLET | Freq: Once | ORAL | Status: AC
  Administered 2017-09-02: 25 mg via ORAL
  Filled 2017-09-02: qty 1

## 2017-09-02 NOTE — ED Notes (Signed)
Dr Regenia Skeeter in room.

## 2017-09-02 NOTE — ED Provider Notes (Addendum)
[facility] EMERGENCY DEPARTMENT Provider Note   CSN: [id number] Arrival date & time: 09/02/17  1048     History   Chief Complaint No chief complaint on file.   HPI [name] is a 71 y.o. male.  HPI 72 year old male presents with acute dizziness and nausea.  At around 9:30 AM he was in physical therapy laying on his back while someone was working on his feet.  He then was told to roll over and he rolled over towards his right side and acutely felt dizzy.  Feels like he is spinning.  This has been persistent since this started but is worse with any type of movement or standing or walking.  There is no headache but has had does feel full like his sinuses are congested.  No ear pain or ear ringing sensation or buzzing.  He has felt nauseated but has not vomited.  He feels slightly improved since onset but is still present.  No chest pain, neck pain, blurry vision, shortness of breath. No recent illness.   Past Medical History:  Diagnosis Date  . Anticoagulants causing adverse effect in therapeutic use   . Anxiety    Paxil seemed to cause odd neuro side effects; better on prozac as of 09/2015  . Aortic regurgitation 04/2007 surgery   Resolved with tissue AVR at [facility]  . Cervical spondylosis    ESI by [name] in Ortho. 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 Chronic lymphocytic leukemia (CLL), B-cell ([place]) approx 2012   stable on f/u's with [name] (most recent 07/2017)  . History of prostate cancer 2003   Rad prost  . History of pulmonary embolism 2011; 10/2014   Recurrence off of anticoagulation 10/2014: per hem/onc ([name]) pt needs lifelong anticoagulation (hypercoag w/u neg).  . Hyperlipidemia    statin-intolerant, except for crestor low dose.  Marland Kitchen Hypertension   . Migraine syndrome   . Mitral regurgitation   . PAF (paroxysmal atrial fibrillation) ([place])   . Panic attacks    "           "             "                  "                  "                        "                             "                         . Pulmonary nodule, right 2015   RLL: resected at [facility] --VATS; Benign RLL nodule (fungal and AFB stains neg).  Plan is for [facility] thoracic surg to do f/u o/v & CT chest 1 yr   . Right-sided headache 07/2016   Primary stabbing HA or migraine per neuro (Dr. Tomi Likens).  Gabapentin and topamax not tolerated.  These resolved spontaneously.    Patient Active Problem List   Diagnosis Date Noted  . CKD (chronic kidney disease), stage III ([place]) 08/11/2017  . Former smoker 08/29/2016  . Cervical spondylosis   . S/P Bioprosthetic AVR (aortic valve replacement) in 2011 03/22/2015  . Recurrent pulmonary embolism ([place])  11/09/2014  . Lung nodule 11/02/2014  . Chronic lymphocytic leukemia ([place]) 11/11/2013  . Migraine 10/08/2010  . GERD 12/11/2009  . MITRAL REGURGITATION 04/07/2009  . Insomnia 02/03/2008  . Anxiety state 09/06/2007  . HLD (hyperlipidemia) 04/20/2007  . Essential hypertension 04/20/2007  . Atrial fibrillation ([place]) 04/20/2007  . PROSTATE CANCER, HX OF 04/20/2007    Past Surgical History:  Procedure Laterality Date  . AORTIC VALVE REPLACEMENT  2011   [facility]  (bioprosthetic)  . CARDIAC CATHETERIZATION  04/2010   Normal coronaries.  . Carotid dopplers  08/2015   NORMAL  . CATARACT EXTRACTION     L 12/06/09   R 09/14/09  . COLONOSCOPY  01/26/13   tic's, o/w normal.  ([place])--recall 10 yrs.  Marland Kitchen PENILE PROSTHESIS IMPLANT    . PROSTATECTOMY  04/2002   for prostate cancer  . Pulmonary nodule resection  Fall/winter 2016   [facility]  . SHOULDER SURGERY     rotator cuff on both arms   . TRANSTHORACIC ECHOCARDIOGRAM  09/20/2014; 03/2016   mild LVH, EF 50-55%, wall motion nl, grade I diast dysfxn, prosth aort valve good,transaortic gradients decreased compared to echo 11/2013.   03/2016: EF 55-60%, normal LV function, severe LVH, normal diast fxn, mild increase in AV gradient compared to 09/2014 echo.       Home Medications    Prior  to Admission medications   Medication Sig Start Date End Date Taking? Authorizing Provider  diclofenac sodium (VOLTAREN) 1 % GEL Apply 2 g topically as needed.    [provider]  FLUoxetine (PROZAC) 10 MG capsule TAKE 1 CAPSULE (10 MG TOTAL) BY MOUTH DAILY. 12/16/16   [name], MD  imipramine (TOFRANIL) 10 MG tablet Take 1 tablet (10 mg total) by mouth at bedtime. 07/11/17   [name], MD  LORazepam (ATIVAN) 0.5 MG tablet TAKE 1 TABLET EVERY DAY AS NEEDED FOR ANXIETY 08/18/17   [name], MD  meclizine (ANTIVERT) 25 MG tablet Take 1 tablet (25 mg total) by mouth 3 (three) times daily as needed for dizziness. 09/02/17   Sherwood Gambler, MD  metoprolol succinate (TOPROL-XL) 25 MG 24 hr tablet TAKE 1 TABLET BY MOUTH EVERY DAY 11/05/16   Josue Hector, MD  rivaroxaban (XARELTO) 20 MG TABS tablet Take 1 tablet (20 mg total) by mouth daily with supper. 08/11/17   [name], MD  rosuvastatin (CRESTOR) 10 MG tablet Take 1 tablet by mouth every Monday, Wednesday, and Friday. 08/28/17   Josue Hector, MD    Family History Family History  Problem Relation Age of Onset  . Cancer Sister        uterine  . Colon cancer Sister 36  . Stroke Mother   . Prostate cancer Father   . Stroke Sister   . Stroke Brother   . Heart attack Neg Hx     Social History Social History  Substance Use Topics  . Smoking status: Former Smoker    Packs/day: 1.00    Years: 12.00    Types: Cigarettes    Start date: 11/28/1968    Quit date: 01/29/1981  . Smokeless tobacco: Never Used     Comment: quit 71 yo   . Alcohol use 0.0 oz/week     Comment: occasional     Allergies   Hydrocodone-acetaminophen; Oxycodone hcl; Telmisartan-hctz; Ramipril; Aspirin; Atorvastatin; Buspirone hcl; Codeine; Codeine phosphate; Hydrocodone-acetaminophen; Morphine sulfate; Paroxetine; and Rabeprazole   Review of Systems Review of Systems  Constitutional: Negative for  fever.  HENT: Negative for  ear pain and tinnitus.   Eyes: Negative for visual disturbance.  Respiratory: Negative for shortness of breath.   Cardiovascular: Negative for chest pain.  Gastrointestinal: Positive for nausea. Negative for abdominal pain and vomiting.  Musculoskeletal: Negative for neck pain.  Neurological: Positive for dizziness. Negative for weakness, numbness and headaches.  All other systems reviewed and are negative.    Physical Exam Updated Vital Signs BP (!) 155/94   Pulse 63   Temp 98.4 F (36.9 C)   Resp 15   SpO2 100%   Physical Exam  Constitutional: He is oriented to person, place, and time. He appears well-developed and well-nourished.  HENT:  Head: Normocephalic and atraumatic.  Right Ear: External ear normal.  Left Ear: External ear normal.  Nose: Nose normal.  Eyes: Pupils are equal, round, and reactive to light. EOM are normal. Right eye exhibits no discharge. Left eye exhibits no discharge. Right eye exhibits no nystagmus. Left eye exhibits no nystagmus.  Neck: Normal range of motion. Neck supple.  Cardiovascular: Normal rate, regular rhythm and normal heart sounds.   Pulmonary/Chest: Effort normal and breath sounds normal.  Abdominal: Soft. There is no tenderness.  Musculoskeletal: He exhibits no edema.  Neurological: He is alert and oriented to person, place, and time.  CN 3-12 grossly intact. 5/5 strength in all 4 extremities. Grossly normal sensation. Normal finger to nose. Patient is able to ambulate but seems to lean slightly to the right, when this is pointed out he is able to correct it  Skin: Skin is warm and dry.  Nursing note and vitals reviewed.    ED Treatments / Results  Labs (all labs ordered are listed, but only abnormal results are displayed) Labs Reviewed  CBC - Abnormal; Notable for the following:       Result Value   WBC 3.0 (*)    All other components within normal limits  DIFFERENTIAL - Abnormal; Notable for the following:    Neutro Abs 1.0 (*)     All other components within normal limits  COMPREHENSIVE METABOLIC PANEL - Abnormal; Notable for the following:    Glucose, Bld 110 (*)    Creatinine, Ser 1.28 (*)    GFR calc non Af Amer 55 (*)    All other components within normal limits  PROTIME-INR  APTT    EKG  EKG Interpretation  Date/Time:  Tuesday September 02 2017 10:57:55 EDT Ventricular Rate:  75 PR Interval:  180 QRS Duration: 144 QT Interval:  430 QTC Calculation: 480 R Axis:   -56 Text Interpretation:  Normal sinus rhythm Right bundle branch block Left anterior fascicular block ** Bifascicular block ** Left ventricular hypertrophy with QRS widening Abnormal ECG no significant change since 2015 Confirmed by Sherwood Gambler [phone]) on 09/02/2017 1:34:29 PM       Radiology Ct Head Wo Contrast  Result Date: 09/02/2017 CLINICAL DATA:  Acute onset of dizziness beginning 1 hour prior to arrival. Focal neuro deficit greater than 6 hours. Stroke suspected. Personal history of CLL. EXAM: CT HEAD WITHOUT CONTRAST TECHNIQUE: Contiguous axial images were obtained from the base of the skull through the vertex without intravenous contrast. COMPARISON:  None FINDINGS: Brain: Mild diffuse white matter changes are present bilaterally. White matter hypoattenuation is present in the internal capsule bilaterally. The basal ganglia are intact. Insular ribbon is normal. No focal cortical defect is present. The brainstem and cerebellum are within normal limits. Vascular: Atherosclerotic calcifications are present within the cavernous  internal carotid artery is bilaterally. There are calcifications at the dural margin of the right vertebral artery. No focal hyperdense vessel is present. Skull: The calvarium is intact. No focal lytic or blastic lesions are present. Sinuses/Orbits: The paranasal sinuses and mastoid air cells are clear. The globes and orbits are within patch the bilateral lens replacements are present. The globes and orbits are  within normal limits bilaterally. IMPRESSION: 1. Diffuse white matter disease is mildly advanced for age. This likely reflects the sequela of chronic microvascular ischemia. 2. No acute intracranial abnormality or focal lesion to explain the patient's acute onset of dizziness. Electronically Signed   By: San Morelle M.D.   On: 09/02/2017 11:34   Mr Brain Wo Contrast  Result Date: 09/02/2017 CLINICAL DATA:  Acute onset dizziness.  Ataxia. EXAM: MRI HEAD WITHOUT CONTRAST TECHNIQUE: Multiplanar, multiecho pulse sequences of the brain and surrounding structures were obtained without intravenous contrast. COMPARISON:  Head CT 09/02/2017 FINDINGS: Brain: There is no evidence of acute infarct, intracranial hemorrhage, mass, midline shift, or extra-axial fluid collection. Generalized cerebral atrophy is within normal limits for age. Patchy cerebral white matter T2 hyperintensities are greatest in the periatrial regions and are nonspecific but compatible with mild chronic small vessel ischemic disease. There is a punctate chronic infarct in the right cerebellum. Punctate foci of T2 hyperintensity in the bilateral basal ganglia and thalami as appear to predominantly represent dilated perivascular spaces rather than chronic lacunar infarcts. Vascular: Major intracranial vascular flow voids are preserved. Skull and upper cervical spine: Unremarkable bone marrow signal. Sinuses/Orbits: Bilateral cataract extraction. Small bilateral maxillary sinus mucous retention cysts or polyps. Clear mastoid air cells. Other: Susceptibility artifact superficial to the right mastoid bone and right occipital bone related to tiny metallic foreign bodies in the subcutaneous tissues. The patient reported no adverse effects to the technologist during the examination. IMPRESSION: 1. No acute intracranial abnormality. 2. Mild chronic small vessel ischemic disease. Electronically Signed   By: Logan Bores M.D.   On: 09/02/2017 17:14     Procedures Procedures (including critical care time)  Medications Ordered in ED Medications  meclizine (ANTIVERT) tablet 25 mg (25 mg Oral Given 09/02/17 1427)     Initial Impression / Assessment and Plan / ED Course  I have reviewed the triage vital signs and the nursing notes.  Pertinent labs & imaging results that were available during my care of the patient were reviewed by me and considered in my medical decision making (see chart for details).     Patient will get MRI given age, no other clear onset of persistent vertigo and white matter disease seen on CT. Feeling better with meclizine. MRI without acute infarct. Likely peripheral cause. Refer to PCP and ENT. Meclizine prn repeat dizziness. D/c home with return precautions.   Final Clinical Impressions(s) / ED Diagnoses   Final diagnoses:  Vertigo    New Prescriptions New Prescriptions   MECLIZINE (ANTIVERT) 25 MG TABLET    Take 1 tablet (25 mg total) by mouth 3 (three) times daily as needed for dizziness.     Sherwood Gambler, MD 09/02/17 Ander Slade    Sherwood Gambler, MD 09/02/17 [phone]

## 2017-09-02 NOTE — ED Notes (Signed)
Pt taken to mri

## 2017-09-02 NOTE — ED Notes (Signed)
Pt ambulated to the restroom independently. Pt states that he no longer has dizziness.

## 2017-09-02 NOTE — ED Triage Notes (Signed)
Patient here with acute onset of dizziness 1 hour pta with nausea. No facial droop, speech clear, no arm drift. Started when he arrived for PT appointment

## 2017-09-02 NOTE — ED Notes (Signed)
Spoke with Pfeiffer MD, do not activate as code stroke

## 2017-09-02 NOTE — ED Notes (Signed)
Pt verbalized understanding of d/c instructions and has no further questions. VSS, NAD. Denies dizziness. Pt educated on use of meclizine. Removed all belongings from room.

## 2017-09-03 ENCOUNTER — Encounter: Payer: Self-pay | Admitting: Family Medicine

## 2017-09-03 LAB — PATHOLOGIST SMEAR REVIEW

## 2017-09-04 ENCOUNTER — Encounter: Payer: Self-pay | Admitting: Family Medicine

## 2017-09-04 ENCOUNTER — Ambulatory Visit (INDEPENDENT_AMBULATORY_CARE_PROVIDER_SITE_OTHER): Payer: Medicare Other | Admitting: Family Medicine

## 2017-09-04 VITALS — BP 128/86 | HR 67 | Temp 98.3°F | Ht 70.0 in | Wt 205.4 lb

## 2017-09-04 DIAGNOSIS — H8110 Benign paroxysmal vertigo, unspecified ear: Secondary | ICD-10-CM | POA: Diagnosis not present

## 2017-09-04 NOTE — Assessment & Plan Note (Addendum)
S:Working at PT ([place] [facility] for his ankle/leg)on 09/02/17 and when he rolled onto his right side felt acutely dizzy/veriginous. No headache. Did have sinus congestion mild. Does feel some fullness in right ear but no hearing loss.  No tinnitus or ear pain. He went to the ER and with his age CT scan as well as MRI was done- neither with acute infarct. EKG: NSR with bifasicular block. Unchanged from 2015 per report. Meclizine helpful in ER and taking 1-2 a day since that time.   States symptoms still with head turning usually lasting matter of seconds.  A/P: Extensive workup already completed. LIkely BPPV. Advised continued meclizine. Discussed formal PT referral but he declines- did give epley maneuver handouts  Does have some fullness right ear- doubt this is meniere's - patient to report if new or worsening sympotms

## 2017-09-04 NOTE — Patient Instructions (Signed)
Glad you are improving  Try the home maneuvers  If not improving- can refer you to formal physical therapy  If symptoms worsen- would want to see you back   Benign Positional Vertigo Vertigo is the feeling that you or your surroundings are moving when they are not. Benign positional vertigo is the most common form of vertigo. The cause of this condition is not serious (is benign). This condition is triggered by certain movements and positions (is positional). This condition can be dangerous if it occurs while you are doing something that could endanger you or others, such as driving. What are the causes? In many cases, the cause of this condition is not known. It may be caused by a disturbance in an area of the inner ear that helps your brain to sense movement and balance. This disturbance can be caused by a viral infection (labyrinthitis), head injury, or repetitive motion. What increases the risk? This condition is more likely to develop in:  Women.  People who are 69 years of age or older.  What are the signs or symptoms? Symptoms of this condition usually happen when you move your head or your eyes in different directions. Symptoms may start suddenly, and they usually last for less than a minute. Symptoms may include:  Loss of balance and falling.  Feeling like you are spinning or moving.  Feeling like your surroundings are spinning or moving.  Nausea and vomiting.  Blurred vision.  Dizziness.  Involuntary eye movement (nystagmus).  Symptoms can be mild and cause only slight annoyance, or they can be severe and interfere with daily life. Episodes of benign positional vertigo may return (recur) over time, and they may be triggered by certain movements. Symptoms may improve over time. How is this diagnosed? This condition is usually diagnosed by medical history and a physical exam of the head, neck, and ears. You may be referred to a health care provider who specializes in  ear, nose, and throat (ENT) problems (otolaryngologist) or a provider who specializes in disorders of the nervous system (neurologist). You may have additional testing, including:  MRI.  A CT scan.  Eye movement tests. Your health care provider may ask you to change positions quickly while he or she watches you for symptoms of benign positional vertigo, such as nystagmus. Eye movement may be tested with an electronystagmogram (ENG), caloric stimulation, the Dix-Hallpike test, or the roll test.  An electroencephalogram (EEG). This records electrical activity in your brain.  Hearing tests.  How is this treated? Usually, your health care provider will treat this by moving your head in specific positions to adjust your inner ear back to normal. Surgery may be needed in severe cases, but this is rare. In some cases, benign positional vertigo may resolve on its own in 2-4 weeks. Follow these instructions at home: Safety  Move slowly.Avoid sudden body or head movements.  Avoid driving.  Avoid operating heavy machinery.  Avoid doing any tasks that would be dangerous to you or others if a vertigo episode would occur.  If you have trouble walking or keeping your balance, try using a cane for stability. If you feel dizzy or unstable, sit down right away.  Return to your normal activities as told by your health care provider. Ask your health care provider what activities are safe for you. General instructions  Take over-the-counter and prescription medicines only as told by your health care provider.  Avoid certain positions or movements as told by your health care  provider.  Drink enough fluid to keep your urine clear or pale yellow.  Keep all follow-up visits as told by your health care provider. This is important. Contact a health care provider if:  You have a fever.  Your condition gets worse or you develop new symptoms.  Your family or friends notice any behavioral  changes.  Your nausea or vomiting gets worse.  You have numbness or a "pins and needles" sensation. Get help right away if:  You have difficulty speaking or moving.  You are always dizzy.  You faint.  You develop severe headaches.  You have weakness in your legs or arms.  You have changes in your hearing or vision.  You develop a stiff neck.  You develop sensitivity to light. This information is not intended to replace advice given to you by your health care provider. Make sure you discuss any questions you have with your health care provider. Document Released: 08/05/2006 Document Revised: 04/04/2016 Document Reviewed: 02/20/2015 Elsevier Interactive Patient Education  [employer].

## 2017-09-04 NOTE — Progress Notes (Signed)
Subjective:  [name] is a 71 y.o. year old very pleasant male patient who presents for/with See problem oriented charting ROS- some vertigo if turns head, no chest pain or shortness of breath. Some feeling of fullness right ear. No facial or extremity weakness. No slurred words or trouble swallowing. no blurry vision or double vision. No paresthesias. No confusion or word finding difficulties.   Past Medical History-  Patient Active Problem List   Diagnosis Date Noted  . S/P Bioprosthetic AVR (aortic valve replacement) in 2011 03/22/2015    Priority: High  . Recurrent pulmonary embolism ([place]) 11/09/2014    Priority: High  . Chronic lymphocytic leukemia ([place]) 11/11/2013    Priority: High  . Atrial fibrillation ([place]) 04/20/2007    Priority: High  . PROSTATE CANCER, HX OF 04/20/2007    Priority: High  . Cervical spondylosis     Priority: Medium  . Migraine 10/08/2010    Priority: Medium  . Insomnia 02/03/2008    Priority: Medium  . Anxiety state 09/06/2007    Priority: Medium  . HLD (hyperlipidemia) 04/20/2007    Priority: Medium  . Essential hypertension 04/20/2007    Priority: Medium  . Former smoker 08/29/2016    Priority: Low  . Lung nodule 11/02/2014    Priority: Low  . GERD 12/11/2009    Priority: Low  . MITRAL REGURGITATION 04/07/2009    Priority: Low  . BPPV (benign paroxysmal positional vertigo) 09/04/2017  . CKD (chronic kidney disease), stage III ([place]) 08/11/2017    Medications- reviewed and updated Current Outpatient Prescriptions  Medication Sig Dispense Refill  . cholecalciferol (VITAMIN D) 1000 units tablet Take 1,000 Units by mouth daily.    . diclofenac sodium (VOLTAREN) 1 % GEL Apply 2 g topically as needed.    Marland Kitchen FLUoxetine (PROZAC) 10 MG capsule TAKE 1 CAPSULE (10 MG TOTAL) BY MOUTH DAILY. 90 capsule 3  . imipramine (TOFRANIL) 10 MG tablet Take 1 tablet (10 mg total) by mouth at bedtime. 90 tablet 0  . LORazepam (ATIVAN) 0.5 MG tablet TAKE 1  TABLET EVERY DAY AS NEEDED FOR ANXIETY 30 tablet 5  . meclizine (ANTIVERT) 25 MG tablet Take 1 tablet (25 mg total) by mouth 3 (three) times daily as needed for dizziness. 15 tablet 0  . metoprolol succinate (TOPROL-XL) 25 MG 24 hr tablet TAKE 1 TABLET BY MOUTH EVERY DAY 30 tablet 8  . rivaroxaban (XARELTO) 20 MG TABS tablet Take 1 tablet (20 mg total) by mouth daily with supper. 90 tablet 3  . rosuvastatin (CRESTOR) 10 MG tablet Take 1 tablet by mouth every Monday, Wednesday, and Friday. 15 tablet 9   No current facility-administered medications for this visit.     Objective: BP 128/86 (BP Location: Left Arm, Patient Position: Sitting, Cuff Size: Large)   Pulse 67   Temp 98.3 F (36.8 C) (Oral)   Ht 5\' 10"  (1.778 m)   Wt 205 lb 6.4 oz (93.2 kg)   SpO2 99%   BMI 29.47 kg/m  Gen: NAD, resting comfortably TM normal bilaterally for age. Oropharynx normal.  CV: RRR no murmurs rubs or gallops Lungs: CTAB no crackles, wheeze, rhonchi Ext: no edema Skin: warm, dry  Assessment/Plan:  BPPV (benign paroxysmal positional vertigo) S:Working at PT ([place] [facility] for his ankle/leg)on 09/02/17 and when he rolled onto his right side felt acutely dizzy/veriginous. No headache. Did have sinus congestion mild. Does feel some fullness in right ear but no hearing loss.  No tinnitus or ear pain.  He went to the ER and with his age CT scan as well as MRI was done- neither with acute infarct. EKG: NSR with bifasicular block. Unchanged from 2015 per report. Meclizine helpful in ER and taking 1-2 a day since that time.   States symptoms still with head turning usually lasting matter of seconds.  A/P: Extensive workup already completed. LIkely BPPV. Advised continued meclizine. Discussed formal PT referral but he declines- did give epley maneuver handouts  Does have some fullness right ear- doubt this is meniere's - patient to report if new or worsening sympotms   Future Appointments Date Time Provider  [place]  09/11/2017 1:00 PM Oren Binet, PhD [facility] None  09/11/2017 3:30 PM [facility] 4 [facility]  10/09/2017 1:00 PM Oren Binet, PhD [facility] None  11/06/2017 1:00 PM Oren Binet, PhD [facility] None  12/04/2017 1:00 PM Oren Binet, PhD [facility] None  01/01/2018 1:00 PM Oren Binet, PhD [facility] None  01/29/2018 1:00 PM Oren Binet, PhD [facility] None  02/26/2018 1:00 PM Oren Binet, PhD [facility] None  03/26/2018 1:00 PM Oren Binet, PhD [facility] None  04/23/2018 1:00 PM Oren Binet, PhD [facility] None  05/21/2018 1:00 PM Oren Binet, PhD [facility] None   Meds ordered this encounter  Medications  . cholecalciferol (VITAMIN D) 1000 units tablet    Sig: Take 1,000 Units by mouth daily.    Return precautions advised.  Garret Reddish, MD

## 2017-09-11 ENCOUNTER — Ambulatory Visit (HOSPITAL_COMMUNITY)
Admission: RE | Admit: 2017-09-11 | Discharge: 2017-09-11 | Disposition: A | Discharge status: Home / Self Care | Payer: Medicare Other | Source: Ambulatory Visit | Attending: Internal Medicine | Admitting: Internal Medicine

## 2017-09-11 ENCOUNTER — Ambulatory Visit (INDEPENDENT_AMBULATORY_CARE_PROVIDER_SITE_OTHER): Payer: Medicare Other | Admitting: Psychology

## 2017-09-11 DIAGNOSIS — F4321 Adjustment disorder with depressed mood: Secondary | ICD-10-CM

## 2017-09-11 DIAGNOSIS — I6523 Occlusion and stenosis of bilateral carotid arteries: Secondary | ICD-10-CM

## 2017-09-14 ENCOUNTER — Encounter: Payer: Self-pay | Admitting: Family Medicine

## 2017-09-28 ENCOUNTER — Other Ambulatory Visit: Payer: Self-pay | Admitting: Family Medicine

## 2017-10-01 ENCOUNTER — Encounter: Payer: Self-pay | Admitting: Family Medicine

## 2017-10-01 ENCOUNTER — Ambulatory Visit: Payer: Medicare Other | Admitting: Family Medicine

## 2017-10-01 ENCOUNTER — Other Ambulatory Visit: Payer: Self-pay

## 2017-10-01 DIAGNOSIS — I10 Essential (primary) hypertension: Secondary | ICD-10-CM | POA: Diagnosis not present

## 2017-10-01 DIAGNOSIS — H8110 Benign paroxysmal vertigo, unspecified ear: Secondary | ICD-10-CM | POA: Diagnosis not present

## 2017-10-01 MED ORDER — MECLIZINE HCL 25 MG PO TABS: 25.0000 mg | ORAL_TABLET | Freq: Three times a day (TID) | ORAL | 0 refills | Status: DC | PRN

## 2017-10-01 MED ORDER — RIVAROXABAN 20 MG PO TABS: 20.0000 mg | ORAL_TABLET | Freq: Every day | ORAL | 3 refills | Status: DC

## 2017-10-01 MED ORDER — ONDANSETRON 4 MG PO TBDP: 4.0000 mg | ORAL_TABLET | Freq: Three times a day (TID) | ORAL | 0 refills | Status: DC | PRN

## 2017-10-01 NOTE — Progress Notes (Signed)
    Subjective:  [name] is a 71 y.o. male who presents today with a chief complaint of vertigo.   HPI:  Vertigo, chronic issue, worsening Patient with chronic history of vertigo.  Had a recurrence of his symptoms yesterday.  Prior to this year when a long time without any sort of symptoms.  He took one meclizine which helps some.  Symptoms are worse with any sort of head movement.  No clear precipitating factors.  He has had some nausea without vomiting.  Tried doing Epley maneuvers at home but was unable to tolerate due to symptoms and nausea.  No weakness or numbness.  No tinnitus.  No hearing loss.  No fevers or chills.  Hypertension, Chronic Problem, worsening BP Readings from Last 3 Encounters:  10/01/17 (!) 168/100  09/04/17 128/86  09/02/17 (!) 150/90   Current Medications: Metoprolol 12.5 mg daily, compliant without side effects.  ROS: Per HPI  PMH: Smoking history reviewed.  Former smoker.  Objective:  Physical Exam: BP (!) 168/100   Pulse 78   Ht 5\' 10"  (1.778 m)   Wt 205 lb (93 kg)   BMI 29.41 kg/m   Gen: NAD, resting comfortably HEENT: TMs clear bilaterally. CV: RRR with no murmurs appreciated Pulm: NWOB, CTAB with no crackles, wheezes, or rhonchi Neuro: Cranial nerves II through XII intact.  Strength 5 out of 5 in upper and lower extremities.  Sensation to light touch intact throughout.  Finger-nose-finger testing intact bilaterally.  Assessment/Plan:  BPPV (benign paroxysmal positional vertigo) No signs or symptoms to suggest central lesion.  Refilled patient's meclizine today.  Also gave prescription for Zofran to help with nausea.  Encourage patient to do Epley maneuvers-hopefully the above medications will help him complete this.  May need referral to vestibular rehab if symptoms fail to improve.  Return precautions reviewed including severe nausea/vomiting, weakness, numbness, and loss of consciousness.  Essential hypertension Typically well  controlled.  Mildly elevated today.  We will not change medications today.  Advised patient to continue checking blood pressure at home and return if pressures consistently 150/90 or above.   Algis Greenhouse. Jerline Pain, MD 10/01/2017 12:45 PM

## 2017-10-01 NOTE — Assessment & Plan Note (Signed)
No signs or symptoms to suggest central lesion.  Refilled patient's meclizine today.  Also gave prescription for Zofran to help with nausea.  Encourage patient to do Epley maneuvers-hopefully the above medications will help him complete this.  May need referral to vestibular rehab if symptoms fail to improve.  Return precautions reviewed including severe nausea/vomiting, weakness, numbness, and loss of consciousness.

## 2017-10-01 NOTE — Assessment & Plan Note (Signed)
Typically well controlled.  Mildly elevated today.  We will not change medications today.  Advised patient to continue checking blood pressure at home and return if pressures consistently 150/90 or above.

## 2017-10-01 NOTE — Patient Instructions (Signed)
Start the zofran and meclizine.  Do the exercises as tolerated.  Let us know if not improving or worsening.  Take care,  Dr Jerline Pain

## 2017-10-08 ENCOUNTER — Other Ambulatory Visit: Payer: Self-pay | Admitting: *Deleted

## 2017-10-08 MED ORDER — IMIPRAMINE HCL 10 MG PO TABS: 10.0000 mg | ORAL_TABLET | Freq: Every day | ORAL | 0 refills | Status: DC

## 2017-10-09 ENCOUNTER — Ambulatory Visit (INDEPENDENT_AMBULATORY_CARE_PROVIDER_SITE_OTHER): Payer: Medicare Other | Admitting: Psychology

## 2017-10-09 DIAGNOSIS — F4321 Adjustment disorder with depressed mood: Secondary | ICD-10-CM

## 2017-10-27 ENCOUNTER — Other Ambulatory Visit: Payer: Self-pay

## 2017-10-27 MED ORDER — METOPROLOL SUCCINATE ER 25 MG PO TB24: 25.0000 mg | ORAL_TABLET | Freq: Every day | ORAL | 8 refills | Status: DC

## 2017-11-06 ENCOUNTER — Ambulatory Visit: Payer: Self-pay | Admitting: Psychology

## 2017-11-20 ENCOUNTER — Encounter: Payer: Self-pay | Admitting: Hematology & Oncology

## 2017-11-24 ENCOUNTER — Inpatient Hospital Stay: Payer: Medicare Other | Attending: Hematology & Oncology | Admitting: Hematology & Oncology

## 2017-11-24 ENCOUNTER — Inpatient Hospital Stay: Payer: Medicare Other

## 2017-11-24 ENCOUNTER — Other Ambulatory Visit: Payer: Self-pay

## 2017-11-24 VITALS — BP 125/81 | HR 77 | Temp 97.7°F | Resp 20 | Wt 206.0 lb

## 2017-11-24 DIAGNOSIS — Z7901 Long term (current) use of anticoagulants: Secondary | ICD-10-CM | POA: Insufficient documentation

## 2017-11-24 DIAGNOSIS — R590 Localized enlarged lymph nodes: Secondary | ICD-10-CM

## 2017-11-24 DIAGNOSIS — C9110 Chronic lymphocytic leukemia of B-cell type not having achieved remission: Secondary | ICD-10-CM | POA: Insufficient documentation

## 2017-11-24 DIAGNOSIS — I2699 Other pulmonary embolism without acute cor pulmonale: Secondary | ICD-10-CM | POA: Insufficient documentation

## 2017-11-24 DIAGNOSIS — R911 Solitary pulmonary nodule: Secondary | ICD-10-CM | POA: Diagnosis not present

## 2017-11-24 LAB — CMP (CANCER CENTER ONLY)
ALT: 25 U/L (ref 0–55)
AST: 23 U/L (ref 5–34)
Albumin: 3.8 g/dL (ref 3.5–5.0)
Alkaline Phosphatase: 66 U/L (ref 26–84)
Anion gap: 15 (ref 5–15)
BUN: 20 mg/dL (ref 7–22)
CO2: 29 mmol/L (ref 18–33)
Calcium: 9.7 mg/dL (ref 8.0–10.3)
Chloride: 102 mmol/L (ref 98–108)
Creatinine: 1.4 mg/dL — ABNORMAL HIGH (ref 0.70–1.30)
Glucose, Bld: 106 mg/dL (ref 70–118)
Potassium: 4.2 mmol/L (ref 3.3–4.7)
Sodium: 146 mmol/L — ABNORMAL HIGH (ref 128–145)
Total Bilirubin: 0.7 mg/dL (ref 0.2–1.2)
Total Protein: 6.7 g/dL (ref 6.4–8.1)

## 2017-11-24 LAB — CBC WITH DIFFERENTIAL (CANCER CENTER ONLY)
Basophils Absolute: 0 10*3/uL (ref 0.0–0.1)
Basophils Relative: 0 %
Eosinophils Absolute: 0.1 10*3/uL (ref 0.0–0.5)
Eosinophils Relative: 2 %
HCT: 44.5 % (ref 38.7–49.9)
Hemoglobin: 15.2 g/dL (ref 13.0–17.1)
Lymphocytes Relative: 40 %
Lymphs Abs: 1.3 10*3/uL (ref 0.9–3.3)
MCH: 31.5 pg (ref 28.0–33.4)
MCHC: 34.2 g/dL (ref 32.0–35.9)
MCV: 92.3 fL (ref 82.0–98.0)
Monocytes Absolute: 0.4 10*3/uL (ref 0.1–0.9)
Monocytes Relative: 13 %
Neutro Abs: 1.4 10*3/uL — ABNORMAL LOW (ref 1.5–6.5)
Neutrophils Relative %: 45 %
Platelet Count: 141 10*3/uL (ref 140–400)
RBC: 4.82 MIL/uL (ref 4.20–5.70)
RDW: 12.9 % (ref 11.1–15.7)
WBC Count: 3.2 10*3/uL — ABNORMAL LOW (ref 4.0–10.3)

## 2017-11-24 NOTE — Progress Notes (Signed)
Hematology and Oncology Follow Up Visit  [name] [facility] [id number] 1946-01-23 72 y.o. 11/24/2017   Principle Diagnosis:   Right lower lobe pulmonary nodule- non-malignant  Recurrent pulmonary embolism  Progressive  Current Therapy:    Xarelto 20 mg by mouth daily       Rituxan/bendamustine-s/p cycle #2 - given 06/18/2017      Interim History:  [name] is back for follow-up.  He made it over to [name].  He had a an incredible time.  He showed me a lot of pictures of wildlife.  He actually gave me a hard drive with a bunch of pictures.  I will see about getting some of these pictures enlarged and put in her frame so I can put around the office.  He had absent no problems while over in [name].  He was on the [facility].  He saw a lot of natural abilities and unfortunately saw the "circle of life" unfold before him.  He absolutely had no problems with fever.  He ate well.  He had no nausea or vomiting.  He had no cough or shortness of breath.  He was on Xarelto.  He is doing well on Xarelto.  He has had no bleeding.  He is not noted any palpable lymph glands.  Overall, his performance status is ECOG 0.  Medications:  Current Outpatient Medications:  .  cholecalciferol (VITAMIN D) 1000 units tablet, Take 1,000 Units by mouth daily., Disp: , Rfl:  .  diclofenac sodium (VOLTAREN) 1 % GEL, Apply 2 g topically as needed., Disp: , Rfl:  .  FLUoxetine (PROZAC) 10 MG capsule, TAKE 1 CAPSULE (10 MG TOTAL) BY MOUTH DAILY., Disp: 90 capsule, Rfl: 3 .  imipramine (TOFRANIL) 10 MG tablet, Take 1 tablet (10 mg total) by mouth at bedtime., Disp: 90 tablet, Rfl: 0 .  LORazepam (ATIVAN) 0.5 MG tablet, TAKE 1 TABLET EVERY DAY AS NEEDED FOR ANXIETY, Disp: 30 tablet, Rfl: 5 .  meclizine (ANTIVERT) 25 MG tablet, Take 1 tablet (25 mg total) by mouth 3 (three) times daily as needed for dizziness., Disp: 30 tablet, Rfl: 0 .  metoprolol succinate (TOPROL-XL) 25 MG 24 hr tablet, Take 1 tablet (25 mg  total) by mouth daily., Disp: 30 tablet, Rfl: 8 .  ondansetron (ZOFRAN ODT) 4 MG disintegrating tablet, Take 1 tablet (4 mg total) by mouth every 8 (eight) hours as needed for nausea or vomiting., Disp: 20 tablet, Rfl: 0 .  rivaroxaban (XARELTO) 20 MG TABS tablet, Take 1 tablet (20 mg total) by mouth daily with supper., Disp: 90 tablet, Rfl: 3 .  rosuvastatin (CRESTOR) 10 MG tablet, Take 1 tablet by mouth every Monday, Wednesday, and Friday., Disp: 15 tablet, Rfl: 9  Allergies:  Allergies  Allergen Reactions  . Hydrocodone-Acetaminophen Shortness Of Breath  . Oxycodone Hcl Shortness Of Breath  . Telmisartan-Hctz Shortness Of Breath and Palpitations    Dizziness, too  . Ramipril   . Aspirin Other (See Comments) and Rash    Other Reaction: confusion Can't take high dose  . Atorvastatin Other (See Comments)    myalgia  . Buspirone Hcl Other (See Comments)    headache  . Codeine Nausea Only and Other (See Comments)    Other Reaction: confusion  . Codeine Phosphate Itching and Nausea Only  . Hydrocodone-Acetaminophen Nausea Only and Other (See Comments)    Other Reaction: confusion  . Morphine Sulfate Itching  . Paroxetine Other (See Comments)    Paresthesias: R arm, R leg,  r side of face  . Rabeprazole Other (See Comments)    headache    Past Medical History, Surgical history, Social history, and Family History were reviewed and updated.  Review of Systems: Review of Systems  Constitutional: Negative.   HENT: Negative.   Eyes: Negative.   Respiratory: Negative.   Cardiovascular: Negative.   Gastrointestinal: Negative.   Genitourinary: Negative.   Musculoskeletal: Negative.   Skin: Negative.   Neurological: Negative.   Endo/Heme/Allergies: Negative.   Psychiatric/Behavioral: Negative.     Physical Exam:  weight is 206 lb (93.4 kg). His oral temperature is 97.7 F (36.5 C). His blood pressure is 125/81 and his pulse is 77. His respiration is 20 and oxygen saturation is  100%.   Physical Exam  Constitutional: He is oriented to person, place, and time.  HENT:  Head: Normocephalic and atraumatic.  Mouth/Throat: Oropharynx is clear and moist.  Eyes: EOM are normal. Pupils are equal, round, and reactive to light.  Neck: Normal range of motion.  Cardiovascular: Normal rate, regular rhythm and normal heart sounds.  Pulmonary/Chest: Effort normal and breath sounds normal.  Abdominal: Soft. Bowel sounds are normal.  Musculoskeletal: Normal range of motion. He exhibits no edema, tenderness or deformity.  Lymphadenopathy:    He has no cervical adenopathy.  Neurological: He is alert and oriented to person, place, and time.  Skin: Skin is warm and dry. No rash noted. No erythema.  Psychiatric: He has a normal mood and affect. His behavior is normal. Judgment and thought content normal.  Vitals reviewed.   Lab Results  Component Value Date   WBC 3.0 (L) 09/02/2017   HGB 14.6 09/02/2017   HCT 44.5 11/24/2017   MCV 92.3 11/24/2017   PLT 165 09/02/2017     Chemistry      Component Value Date/Time   NA 146 (H) 11/24/2017 1432   NA 145 07/22/2017 0756   NA 139 06/27/2016 1410   K 4.2 11/24/2017 1432   K 4.3 07/22/2017 0756   K 5.1 06/27/2016 1410   CL 102 11/24/2017 1432   CL 105 07/22/2017 0756   CO2 29 11/24/2017 1432   CO2 30 07/22/2017 0756   CO2 28 06/27/2016 1410   BUN 20 11/24/2017 1432   BUN 17 07/22/2017 0756   BUN 20.9 06/27/2016 1410   CREATININE 1.28 (H) 09/02/2017 1059   CREATININE 1.6 (H) 07/22/2017 0756   CREATININE 1.6 (H) 06/27/2016 1410      Component Value Date/Time   CALCIUM 9.7 11/24/2017 1432   CALCIUM 9.6 07/22/2017 0756   CALCIUM 9.6 06/27/2016 1410   ALKPHOS 66 11/24/2017 1432   ALKPHOS 59 07/22/2017 0756   ALKPHOS 62 06/27/2016 1410   AST 23 11/24/2017 1432   AST 18 06/27/2016 1410   ALT 25 11/24/2017 1432   ALT 24 07/22/2017 0756   ALT 24 06/27/2016 1410   BILITOT 0.7 11/24/2017 1432   BILITOT 0.46 06/27/2016  1410         Impression and Plan: [name] is 72 year-old African-American gentleman. He has recurrent pulmonary embolism. I think he needs lifelong anticoagulation.He is doing very well on Xarelto. He enjoys being on Xarelto.  Again, I am absolutely amazed as to the pictures that he took.  Everything really turned out great for him over in [name].  I do want to continue to follow his CLL closely.  We only gave him 2 cycles of treatment.  The point of treating him before his trip was  to get his adenopathy resolved so he would not have any issues over in [name].  This worked incredibly well.  I would like to get him back in 3 months.  I want to do scans on him when we see him back.  His blood counts certainly look quite good so I would not think that there is an issue with respect to his CLL becoming active.  I think if everything looks good in 3 months, then we can probably get him back in 6 months.     Volanda Napoleon, MD 1/14/20195:15 PM

## 2017-11-25 LAB — BETA 2 MICROGLOBULIN, SERUM: Beta-2 Microglobulin: 2.1 mg/L (ref 0.6–2.4)

## 2017-11-25 LAB — LACTATE DEHYDROGENASE: LDH: 305 U/L — ABNORMAL HIGH (ref 125–245)

## 2017-12-01 ENCOUNTER — Ambulatory Visit: Payer: Self-pay

## 2017-12-01 ENCOUNTER — Encounter: Payer: Self-pay | Admitting: Hematology & Oncology

## 2017-12-01 NOTE — Telephone Encounter (Signed)
Pt. States he was seen at an urgent care over the weekend for sinus infection. States he was given a steroid shot and a prescription of Prednisone 10 mg tablets take 4 pills daily x 12 days. Wants Dr. Ansel Bong opinion if this Prednisone prescription is ok for him.

## 2017-12-01 NOTE — Telephone Encounter (Signed)
See note

## 2017-12-02 ENCOUNTER — Encounter: Payer: Self-pay | Admitting: Family Medicine

## 2017-12-02 NOTE — Telephone Encounter (Signed)
Patient returned call, patient given note by Dr. Yong Channel 12/02/17, patient verbalized understanding.

## 2017-12-02 NOTE — Telephone Encounter (Signed)
Should be ok- I might stop it at 7-10 days especially if symptoms much improved. He can monitor his blood pressure and make sure that is not higher on it and let me know if it is consistently above 140/90. Tell him certainly hope he feels better.

## 2017-12-02 NOTE — Telephone Encounter (Signed)
Called and left a voicemail message asking for a return phone call 

## 2017-12-02 NOTE — Telephone Encounter (Signed)
This encounter was created in error - please disregard.

## 2017-12-03 ENCOUNTER — Encounter: Payer: Self-pay | Admitting: Family Medicine

## 2017-12-03 NOTE — Telephone Encounter (Signed)
Please return patient's call if a call was made.   Copied from [place]. Topic: Quick Communication - Office Called Patient >> Dec 02, 2017  5:27 PM Corie Chiquito, [place] wrote: Reason for CRM: Patient returned a call from [place]. If she could give him a call back at [phone]. If no answer please leave msg

## 2017-12-04 ENCOUNTER — Ambulatory Visit (INDEPENDENT_AMBULATORY_CARE_PROVIDER_SITE_OTHER): Payer: Medicare Other | Admitting: Psychology

## 2017-12-04 DIAGNOSIS — F4321 Adjustment disorder with depressed mood: Secondary | ICD-10-CM

## 2017-12-05 ENCOUNTER — Other Ambulatory Visit: Payer: Self-pay | Admitting: Family Medicine

## 2017-12-05 DIAGNOSIS — F411 Generalized anxiety disorder: Secondary | ICD-10-CM

## 2017-12-15 ENCOUNTER — Encounter: Payer: Self-pay | Admitting: Family Medicine

## 2017-12-21 NOTE — Progress Notes (Signed)
Patient ID: [name], male   DOB: 06/10/1946, 72 y.o.   MRN: [id number]    Cardiology Office Note   Date:  12/26/2017   ID:  [name], DOB [birth year], MRN [id number]  PCP:  [name], MD  Cardiologist:  [name]     No chief complaint on file.    History of Present Illness: [name] is a 72 y.o. male with a hx of severe aortic insufficiency, status post bioprosthetic AVR by [name] at [facility] in 05/2010, normal coronary arteries by cardiac catheterization in 2011, prior DVT, persistent LUE basilic/cephalic thrombus, recurrent pulmonary emboli (most recently 10/2014), PAF, Xarelto anticoagulation, HTN, HL, CLL, prostate CA status post radical prostatectomy, RLL lung nodule. The patient has been followed by [name] with oncology.  04/03/15  Had throascopic RLL wedge resection [place] Turned out to be benign granulomatous nodule And not cancer   Echo  03/19/17 mean gradient 21 mmHg peak 36 mmHg   .Study Conclusions  - Left ventricle: The cavity size was normal. There was moderate   concentric hypertrophy. Systolic function was normal. The   estimated ejection fraction was in the range of 55% to 60%. Wall   motion was normal; there were no regional wall motion   abnormalities. Features are consistent with a pseudonormal left   ventricular filling pattern, with concomitant abnormal relaxation   and increased filling pressure (grade 2 diastolic dysfunction).   Doppler parameters are consistent with indeterminate ventricular   filling pressure. - Aortic valve: A bioprosthesis was present and functioning well.   Mean gradient is mildly elevated but unchanged from 04/02/16.   There was no regurgitation. - Aorta: Ascending aortic diameter: 40 mm (S). - Ascending aorta: The ascending aorta was mildly dilated. - Mitral valve: Transvalvular velocity was within the normal range.   There was no evidence for stenosis. There was mild regurgitation. - Left  atrium: The atrium was severely dilated. - Right ventricle: The cavity size was normal. Wall thickness was   normal. Systolic function was normal. - Tricuspid valve: There was mild regurgitation. - Pulmonary arteries: Systolic pressure was within the normal   range.  Switching primary to Dr Yong Channel . [facility] related to [facility] and agent orange Which has been linked to CLL Last chemo June 2018 Rituxan/bendamustine Was Rx before His trip to [name] so adenopathy would not bother him   Sinus infection 12/01/17 given prednisone   Showed me pictures of his trip to [name] and will be going to [place] latter this year  Past Medical History:  Diagnosis Date  . Anticoagulants causing adverse effect in therapeutic use   . Anxiety    Paxil seemed to cause odd neuro side effects; better on prozac as of 09/2015  . Aortic regurgitation 04/2007 surgery   Resolved with tissue AVR at [facility]  . Cervical spondylosis    ESI by [name] in Ortho. 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 Chronic lymphocytic leukemia (CLL), B-cell ([place]) approx 2012   stable on f/u's with [name] (most recent 07/2017)  . History of prostate cancer 2003   Rad prost  . History of pulmonary embolism 2011; 10/2014   Recurrence off of anticoagulation 10/2014: per hem/onc ([name]) pt needs lifelong anticoagulation (hypercoag w/u neg).  . Hyperlipidemia    statin-intolerant, except for crestor low dose.  Marland Kitchen Hypertension   . Migraine syndrome   . Mitral regurgitation   . PAF (paroxysmal atrial fibrillation) (  [place])   . Panic attacks    "           "             "                  "                  "                       "                             "                         . Pulmonary nodule, right 2015   RLL: resected at [facility] --VATS; Benign RLL nodule (fungal and AFB stains neg).  Plan is for [facility] thoracic surg to do f/u o/v & CT chest 1 yr   . Right-sided headache 07/2016   Primary stabbing HA or migraine per  neuro (Dr. Tomi Likens).  Gabapentin and topamax not tolerated.  These resolved spontaneously.    Past Surgical History:  Procedure Laterality Date  . AORTIC VALVE REPLACEMENT  2011   [facility]  (bioprosthetic)  . CARDIAC CATHETERIZATION  04/2010   Normal coronaries.  . Carotid dopplers  08/2015   NORMAL  . CATARACT EXTRACTION     L 12/06/09   R 09/14/09  . COLONOSCOPY  01/26/13   tic's, o/w normal.  ([place])--recall 10 yrs.  Marland Kitchen PENILE PROSTHESIS IMPLANT    . PROSTATECTOMY  04/2002   for prostate cancer  . Pulmonary nodule resection  Fall/winter 2016   [facility]  . SHOULDER SURGERY     rotator cuff on both arms   . TRANSTHORACIC ECHOCARDIOGRAM  09/20/2014; 03/2016; 03/2017   2015-mild LVH, EF 50-55%, wall motion nl, grade I diast dysfxn, prosth aort valve good,transaortic gradients decreased compared to echo 11/2013.   03/2016: EF 55-60%, normal LV function, severe LVH, normal diast fxn, mild increase in AV gradient compared to 09/2014 echo.  2018: EF 55-60%, normal LV fxn, grd II DD, AV stable/gradient's stable.     Current Outpatient Medications  Medication Sig Dispense Refill  . cholecalciferol (VITAMIN D) 1000 units tablet Take 1,000 Units by mouth daily.    . diclofenac sodium (VOLTAREN) 1 % GEL Apply 2 g topically as needed.    Marland Kitchen FLUoxetine (PROZAC) 20 MG capsule Take 1 capsule (20 mg total) by mouth daily. 30 capsule 5  . imipramine (TOFRANIL) 10 MG tablet Take 1 tablet (10 mg total) by mouth at bedtime. 90 tablet 0  . LORazepam (ATIVAN) 0.5 MG tablet TAKE 1 TABLET EVERY DAY AS NEEDED FOR ANXIETY 30 tablet 5  . meclizine (ANTIVERT) 25 MG tablet Take 1 tablet (25 mg total) by mouth 3 (three) times daily as needed for dizziness. 30 tablet 0  . metoprolol succinate (TOPROL-XL) 25 MG 24 hr tablet Take 1 tablet (25 mg total) by mouth daily. 30 tablet 8  . rivaroxaban (XARELTO) 20 MG TABS tablet Take 1 tablet (20 mg total) by mouth daily with supper. 90 tablet 3  . rosuvastatin (CRESTOR) 10 MG tablet  Take 1 tablet by mouth every Monday, Wednesday, and Friday. 15 tablet 9   No current facility-administered medications for this visit.     Allergies:   Hydrocodone-acetaminophen; Oxycodone hcl; Telmisartan-hctz;  Ramipril; Aspirin; Atorvastatin; Buspirone hcl; Codeine; Codeine phosphate; Hydrocodone-acetaminophen; Morphine sulfate; Paroxetine; and Rabeprazole    Social History:  The patient  reports that he quit smoking about 36 years ago. His smoking use included cigarettes. He started smoking about 49 years ago. He has a 12.00 pack-year smoking history. he has never used smokeless tobacco. He reports that he drinks alcohol. He reports that he does not use drugs.   Family History:  The patient's family history includes Cancer in his sister; Colon cancer (age of onset: 92) in his sister; Prostate cancer in his father; Stroke in his brother, mother, and sister.    ROS:   Please see the history of present illness.   Review of Systems  All other systems reviewed and are negative.     PHYSICAL EXAM: VS:  BP 118/68   Pulse 96   Ht 5\' 10"  (1.778 m)   Wt 210 lb 12 oz (95.6 kg)   BMI 30.24 kg/m     Wt Readings from Last 3 Encounters:  12/26/17 210 lb 12 oz (95.6 kg)  12/23/17 210 lb 6.4 oz (95.4 kg)  11/24/17 206 lb (93.4 kg)     Affect appropriate Healthy:  appears stated age HEENT: normal Neck supple with no adenopathy JVP normal no bruits no thyromegaly Lungs clear with no wheezing and good diaphragmatic motion Heart:  S1/S2 SEM no AR  murmur, no rub, gallop or click PMI normal Abdomen: benighn, BS positve, no tenderness, no AAA no bruit.  No HSM or HJR Distal pulses intact with no bruits No edema Neuro non-focal Skin warm and dry No muscular weakness    EKG:   03/22/15  NSR, HR 67, LAFB, RBBB, no change from prior tracing  04/02/16 SR rate 62 LAD RBBB LVH  06/18/17 SR rate 78 LAD RBBB LVH    Recent Labs: 09/02/2017: Hemoglobin 14.6 11/24/2017: ALT 25; BUN 20; Creatinine  1.40; Platelet Count 141; Potassium 4.2; Sodium 146    Lipid Panel    Component Value Date/Time   CHOL 222 (H) 08/11/2017 0843   TRIG 162.0 (H) 08/11/2017 0843   HDL 44.40 08/11/2017 0843   CHOLHDL 5 08/11/2017 0843   VLDL 32.4 08/11/2017 0843   LDLCALC 146 (H) 08/11/2017 0843   LDLDIRECT 169.0 03/30/2013 0936      ASSESSMENT AND PLAN:  Aortic insufficiency S/P Bioprosthetic AVR (aortic valve replacement) in 2011 Increased gradients on echo continue anticoagulation.  F/U echo May 2019 [facility] Mean gradient 21 mmHg peak 36 mmHg echo 03/19/17 stable since 2017  History of DVT (deep vein thrombosis) and Recurrent pulmonary embolism Continue Xarelto. This is managed by heme/onc.   Paroxysmal atrial fibrillation Maintaining NSR.  Continue Toprol-XL and Xarelto.  Essential hypertension Well controlled.  Continue current medications and low sodium Dash type diet.    HLD (hyperlipidemia) Has had leg pain with statins in past. Taking crestor 3 x/week   Lung nodule  Post VATS at [facility]  Benign nodule healing well   Carotid Bruit:  Duplex 09/11/17  no stenosis f/u 09/2019   Depression:  Better off paroxetine now on low dose prozac with tofranil  Headache:  F/u primary PRN tylenol ok has schrap metal on right side of head Cant have MRI  FBP:ZWCH with primary typically Cr around 1.7   BBB:  RBBB LAD post AVR ECG q 6 months follow for more advanced AV block   CLL:  F/u with Dr Jonette Eva last chemo August 2018 with resolution of adenopathy  Lab  Results  Component Value Date   WBC 3.2 (L) 11/24/2017   HGB 14.6 09/02/2017   HCT 44.5 11/24/2017   MCV 92.3 11/24/2017   PLT 141 11/24/2017      Current medicines are reviewed at length with the patient today.  Concerns regarding medicines are as outlined above.  The following changes have been made:       Jenkins Rouge

## 2017-12-23 ENCOUNTER — Ambulatory Visit: Payer: Medicare Other | Admitting: Family Medicine

## 2017-12-23 ENCOUNTER — Encounter: Payer: Self-pay | Admitting: Family Medicine

## 2017-12-23 VITALS — BP 128/86 | HR 68 | Temp 98.3°F | Ht 70.0 in | Wt 210.4 lb

## 2017-12-23 DIAGNOSIS — R42 Dizziness and giddiness: Secondary | ICD-10-CM

## 2017-12-23 DIAGNOSIS — I480 Paroxysmal atrial fibrillation: Secondary | ICD-10-CM

## 2017-12-23 DIAGNOSIS — F411 Generalized anxiety disorder: Secondary | ICD-10-CM

## 2017-12-23 MED ORDER — FLUOXETINE HCL 20 MG PO CAPS: 20.0000 mg | ORAL_CAPSULE | Freq: Every day | ORAL | 5 refills | Status: DC

## 2017-12-23 NOTE — Progress Notes (Signed)
Subjective:  [name] is a 72 y.o. year old very pleasant male patient who presents for/with See problem oriented charting ROS- has had dizziness but no chest pain.    Past Medical History-  Patient Active Problem List   Diagnosis Date Noted  . S/P Bioprosthetic AVR (aortic valve replacement) in 2011 03/22/2015    Priority: High  . Recurrent pulmonary embolism ([place]) 11/09/2014    Priority: High  . Chronic lymphocytic leukemia ([place]) 11/11/2013    Priority: High  . Atrial fibrillation ([place]) 04/20/2007    Priority: High  . PROSTATE CANCER, HX OF 04/20/2007    Priority: High  . Cervical spondylosis     Priority: Medium  . Migraine 10/08/2010    Priority: Medium  . Insomnia 02/03/2008    Priority: Medium  . Anxiety state 09/06/2007    Priority: Medium  . HLD (hyperlipidemia) 04/20/2007    Priority: Medium  . Essential hypertension 04/20/2007    Priority: Medium  . Former smoker 08/29/2016    Priority: Low  . Lung nodule 11/02/2014    Priority: Low  . GERD 12/11/2009    Priority: Low  . MITRAL REGURGITATION 04/07/2009    Priority: Low  . BPPV (benign paroxysmal positional vertigo) 09/04/2017  . CKD (chronic kidney disease), stage III ([place]) 08/11/2017    Medications- reviewed and updated Current Outpatient Medications  Medication Sig Dispense Refill  . cholecalciferol (VITAMIN D) 1000 units tablet Take 1,000 Units by mouth daily.    . diclofenac sodium (VOLTAREN) 1 % GEL Apply 2 g topically as needed.    Marland Kitchen FLUoxetine (PROZAC) 10 MG capsule TAKE 1 CAPSULE (10 MG TOTAL) BY MOUTH DAILY. 90 capsule 3  . imipramine (TOFRANIL) 10 MG tablet Take 1 tablet (10 mg total) by mouth at bedtime. 90 tablet 0  . LORazepam (ATIVAN) 0.5 MG tablet TAKE 1 TABLET EVERY DAY AS NEEDED FOR ANXIETY 30 tablet 5  . meclizine (ANTIVERT) 25 MG tablet Take 1 tablet (25 mg total) by mouth 3 (three) times daily as needed for dizziness. 30 tablet 0  . metoprolol succinate (TOPROL-XL) 25 MG 24 hr  tablet Take 1 tablet (25 mg total) by mouth daily. 30 tablet 8  . rivaroxaban (XARELTO) 20 MG TABS tablet Take 1 tablet (20 mg total) by mouth daily with supper. 90 tablet 3  . rosuvastatin (CRESTOR) 10 MG tablet Take 1 tablet by mouth every Monday, Wednesday, and Friday. 15 tablet 9   No current facility-administered medications for this visit.     Objective: BP 128/86   Pulse 68   Temp 98.3 F (36.8 C) (Oral)   Ht '5\' 10"'$  (1.778 m)   Wt 210 lb 6.4 oz (95.4 kg)   SpO2 96%   BMI 30.19 kg/m  Gen: NAD, resting comfortably CV: RRR no murmurs rubs or gallops Lungs: CTAB no crackles, wheeze, rhonchi Abdomen: soft/nontender/nondistended/normal bowel sounds. overweight Ext: no edema Skin: warm, dry Neuro: CN II-XII intact, sensation and reflexes normal throughout, 5/5 muscle strength in bilateral upper and lower extremities. Normal finger to nose. Normal rapid alternating movements. No pronator drift. Normal romberg. Normal gait.   Assessment/Plan:  Dizziness S: finished tai chi class and afterwards while talking last night then felt a rush of sudden dizziness. Was not with head movement. Had BPPV back in the fall- this feels far different.   States he has always had these type of rushes since he started antidepressants years ago but never this severe. He knows these are coming before  they come and the more he tries to control it the stronger it gets. Usually if sits or drinks water it will improve. Has blurry vision with the episodes. They last about a minute. Has been happening for years- much worse in intensity in last month or two. Frequency is same but intensity worse. Gets these episodes every other day. Resolves if he takes lorazepam 0.'25mg'$ - makes him take a nap but then again episodes only a minute. Trialed off for 3 days of lorazepam and still had episodes. If he took mucinex (noted side effect with imimpramine of increased anticholinergic effects)- more lingering dizziness with these  episodes  Has had double vision at times as noted- made an appointment with his eye doctor to be further evaluated. Part of [facility].   Has not checked his blood pressures with these because he has anxiety with checking his blood pressure and spikes in past when anxious A/P: Dizzy episodes that worsen as he thinks of them and improve as he rests or takes a lorazepam- strong chance this is anxiety related. Will increase prozac to '20mg'$ .   To rule out arhythmia will do  cardiac 72 hour monitor. Held off on EKG today as no acute issues. He will return for TSH. Already had carotid duplex 09/11/17 and 1-39% stenosis of RICA and LICA. Also sees cardiology later this week  Has had recent echocardiogram 03/19/17 which appeared stable from 2017  He agrees to see his eye doctor as well for blurry vision portion.   Anxiety state Suspect every other day episodes of dizziness, slightl blurry vision for years- recently worsened in intensity are anxiety related. Increase prozac to '20mg'$ . Advised recheck when I return from my time out of office- he can also see one of my colleagues if needed.   Atrial fibrillation (HCC) History of paroxysmal a fib. He remains on xarelto and metoprolol. No known clear a fib in years since aortic valve replacement- we will get 24 hour monitor to rule out rhythm issue with dizzy episodes   Future Appointments  Date Time Provider [place]  12/26/2017  8:30 AM Josue Hector, MD [facility]  12/30/2017 10:30 AM [facility]  01/01/2018  1:00 PM Oren Binet, PhD [facility] None  01/29/2018  1:00 PM Oren Binet, PhD [facility] None  02/16/2018  2:00 PM [facility]  02/16/2018  3:00 PM [facility] None  02/16/2018  3:30 PM Ennever, Rudell Cobb, MD [facility] None  02/26/2018  1:00 PM Oren Binet, PhD [facility] None  03/26/2018  1:00 PM Oren Binet, PhD [facility] None  04/23/2018  1:00 PM Oren Binet, PhD [facility] None  05/21/2018  1:00 PM Oren Binet, PhD [facility] None   Lab/Order associations: Dizziness - Plan: Cardiac event monitor, TSH  Anxiety state - Plan: FLUoxetine (PROZAC) 20 MG capsule    Meds ordered this encounter  Medications  . FLUoxetine (PROZAC) 20 MG capsule    Sig: Take 1 capsule (20 mg total) by mouth daily.    Dispense:  30 capsule    Refill:  5    Return precautions advised.  Garret Reddish, MD

## 2017-12-23 NOTE — Patient Instructions (Addendum)
We will call you within a week or two about your referral for heart monitor. If you do not hear within 3 weeks, give [name] a call.   If you could please stop by for a thyroid test sometime in next week or two- call back to schedule this  Increase prozac to 20mg   Follow up in 2 months or sooner if you need [name]      Taking the medicine as directed and not missing any doses is one of the best things you can do to treat your depression.  Here are some things to keep in mind:  1) Side effects (stomach upset, some increased anxiety) may happen before you notice a benefit.  These side effects typically go away over time. 2) Changes to your dose of medicine or a change in medication all together is sometimes necessary 3) Most people need to be on medication at least 6-12 months 4) Many people will notice an improvement within two weeks but the full effect of the medication can take up to 4-6 weeks 5) Stopping the medication when you start feeling better often results in a return of symptoms 6) If you start having thoughts of hurting yourself or others after starting this medicine, call our office immediately at [phone] or seek care through 911.

## 2017-12-24 NOTE — Assessment & Plan Note (Addendum)
History of paroxysmal a fib. He remains on xarelto and metoprolol. No known clear a fib in years since aortic valve replacement- we will get 24 hour monitor to rule out rhythm issue with dizzy episodes

## 2017-12-24 NOTE — Assessment & Plan Note (Signed)
Suspect every other day episodes of dizziness, slightl blurry vision for years- recently worsened in intensity are anxiety related. Increase prozac to 20mg . Advised recheck when I return from my time out of office- he can also see one of my colleagues if needed.

## 2017-12-26 ENCOUNTER — Ambulatory Visit: Payer: Medicare Other | Admitting: Cardiovascular Disease

## 2017-12-26 ENCOUNTER — Encounter: Payer: Self-pay | Admitting: Cardiovascular Disease

## 2017-12-26 VITALS — BP 118/68 | HR 96 | Ht 70.0 in | Wt 210.8 lb

## 2017-12-26 DIAGNOSIS — I351 Nonrheumatic aortic (valve) insufficiency: Secondary | ICD-10-CM

## 2017-12-26 DIAGNOSIS — E785 Hyperlipidemia, unspecified: Secondary | ICD-10-CM | POA: Diagnosis not present

## 2017-12-26 NOTE — Patient Instructions (Addendum)
Medication Instructions:  Your physician recommends that you continue on your current medications as directed. Please refer to the Current Medication list given to you today.  Labwork: NONE  Testing/Procedures: Your physician has requested that you have an echocardiogram in May. Echocardiography is a painless test that uses sound waves to create images of your heart. It provides your doctor with information about the size and shape of your heart and how well your heart's chambers and valves are working. This procedure takes approximately one hour. There are no restrictions for this procedure.  Follow-Up: Your physician wants you to follow-up in: May with Dr. [name].    If you need a refill on your cardiac medications before your next appointment, please call your pharmacy.

## 2017-12-28 ENCOUNTER — Ambulatory Visit (HOSPITAL_COMMUNITY)
Admission: EM | Admit: 2017-12-28 | Discharge: 2017-12-28 | Disposition: A | Discharge status: Home / Self Care | Payer: Medicare Other | Attending: Internal Medicine | Admitting: Internal Medicine

## 2017-12-28 DIAGNOSIS — J069 Acute upper respiratory infection, unspecified: Secondary | ICD-10-CM | POA: Diagnosis not present

## 2017-12-28 DIAGNOSIS — B9789 Other viral agents as the cause of diseases classified elsewhere: Secondary | ICD-10-CM | POA: Diagnosis not present

## 2017-12-28 DIAGNOSIS — R05 Cough: Secondary | ICD-10-CM | POA: Insufficient documentation

## 2017-12-28 LAB — POCT RAPID STREP A: Streptococcus, Group A Screen (Direct): NEGATIVE

## 2017-12-28 MED ORDER — DEXTROMETHORPHAN POLISTIREX ER 30 MG/5ML PO SUER: 15.0000 mg | ORAL | 0 refills | Status: DC | PRN

## 2017-12-28 MED ORDER — DEXTROMETHORPHAN POLISTIREX ER 30 MG/5ML PO SUER: 15.0000 mg | Freq: Three times a day (TID) | ORAL | 0 refills | Status: DC

## 2017-12-28 MED ORDER — BENZONATATE 100 MG PO CAPS: 200.0000 mg | ORAL_CAPSULE | Freq: Three times a day (TID) | ORAL | 0 refills | Status: DC

## 2017-12-28 NOTE — ED Triage Notes (Signed)
Per pt dry coughing, chills, body aches, nasal congestion,

## 2017-12-28 NOTE — ED Provider Notes (Signed)
12/28/2017 2:01 PM   DOB: 01-03-1946 / MRN: [id number]  SUBJECTIVE:  [name] is a 72 y.o. male presenting for cough.  This started 4 days ago.  He also complains of myalgia, rhinorrhea, headache all of which have resolved since the onset.  Tells me the cough is starting to improve albeit slowly.  He denies chest pain, shortness of breath, leg swelling.  Immunization History  Administered Date(s) Administered  . Influenza Split 08/16/2011, 08/14/2012  . Influenza Whole 08/17/2010  . Influenza, High Dose Seasonal PF 08/23/2013, 08/29/2016, 08/11/2017  . Influenza,inj,Quad PF,6+ Mos 09/05/2014, 08/02/2015  . Pneumococcal Conjugate-13 01/23/2015  . Pneumococcal Polysaccharide-23 03/15/2011  . Td 04/16/2007  . Zoster 08/16/2011     He is allergic to hydrocodone-acetaminophen; oxycodone hcl; telmisartan-hctz; ramipril; aspirin; atorvastatin; buspirone hcl; codeine; codeine phosphate; hydrocodone-acetaminophen; morphine sulfate; paroxetine; and rabeprazole.   He  has a past medical history of Anticoagulants causing adverse effect in therapeutic use, Anxiety, Aortic regurgitation (04/2007 surgery), Cervical spondylosis, Chronic lymphocytic leukemia ([place]), B-cell ([place]) (approx 2012), History of prostate cancer (2003), History of pulmonary embolism (2011; 10/2014), Hyperlipidemia, Hypertension, Migraine syndrome, Mitral regurgitation, PAF (paroxysmal atrial fibrillation) ([place]), Panic attacks, Pulmonary nodule, right (2015), and Right-sided headache (07/2016).    He  reports that he quit smoking about 36 years ago. His smoking use included cigarettes. He started smoking about 49 years ago. He has a 12.00 pack-year smoking history. he has never used smokeless tobacco. He reports that he drinks alcohol. He reports that he does not use drugs. He  has no sexual activity history on file. The patient  has a past surgical history that includes Cardiac catheterization (04/2010); Penile prosthesis implant;  Prostatectomy (04/2002); Cataract extraction; Aortic valve replacement (2011); Colonoscopy (01/26/13); transthoracic echocardiogram (09/20/2014; 03/2016; 03/2017); Carotid dopplers (08/2015); Pulmonary nodule resection (Fall/winter 2016); and Shoulder surgery.  His family history includes Cancer in his sister; Colon cancer (age of onset: 30) in his sister; Prostate cancer in his father; Stroke in his brother, mother, and sister.  Review of Systems  Constitutional: Negative for chills, diaphoresis and fever.  Eyes: Negative.   Respiratory: Positive for cough and sputum production. Negative for hemoptysis, shortness of breath and wheezing.   Cardiovascular: Negative for chest pain, orthopnea and leg swelling.  Gastrointestinal: Negative for nausea.  Skin: Negative for rash.  Neurological: Negative for dizziness, sensory change, speech change, focal weakness and headaches.    OBJECTIVE:  BP 129/68 (BP Location: Left Arm)   Pulse 72   Temp 98.1 F (36.7 C)   SpO2 99%   Physical Exam  Constitutional: He appears well-developed. He is active and cooperative.  Non-toxic appearance.  Cardiovascular: Normal rate, regular rhythm, S1 normal, S2 normal, normal heart sounds, intact distal pulses and normal pulses. Exam reveals no gallop and no friction rub.  No murmur heard. Pulmonary/Chest: Effort normal. No stridor. No tachypnea. No respiratory distress. He has no wheezes. He has no rales.  Abdominal: He exhibits no distension.  Musculoskeletal: He exhibits no edema.  Neurological: He is alert.  Skin: Skin is warm and dry. He is not diaphoretic. No pallor.  Vitals reviewed.   Results for orders placed or performed during the hospital encounter of 12/28/17 (from the past 72 hour(s))  POCT rapid strep A [facility])     Status: None   Collection Time: 12/28/17  1:30 PM  Result Value Ref Range   Streptococcus, Group A Screen (Direct) NEGATIVE NEGATIVE    No results found.  ASSESSMENT AND  PLAN:  Orders Placed This Encounter  Procedures  . Culture, group A strep    Standing Status:   Standing    Number of Occurrences:   1  . POCT rapid strep A [facility])    Standing Status:   Standing    Number of Occurrences:   1     Viral URI with cough - Exam reassuring.  Will treat symptomatically.  RTC precautions discussed.      The patient is advised to call or return to clinic if he does not see an improvement in symptoms, or to seek the care of the closest emergency department if he worsens with the above plan.   Philis Fendt, MHS, PA-C 12/28/2017 2:01 PM   Tereasa Coop, PA-C 12/28/17 1401

## 2017-12-28 NOTE — Discharge Instructions (Addendum)
Your exam is excellent today.  I have given you a few medications to treat her cough.  Please drink lots of fluids over the next few days.  If at any point you feel you are getting worse.  We would be happy to see you back anytime.

## 2017-12-30 ENCOUNTER — Other Ambulatory Visit (INDEPENDENT_AMBULATORY_CARE_PROVIDER_SITE_OTHER): Payer: Medicare Other

## 2017-12-30 DIAGNOSIS — R42 Dizziness and giddiness: Secondary | ICD-10-CM | POA: Diagnosis not present

## 2017-12-30 LAB — TSH: TSH: 1.63 u[IU]/mL (ref 0.35–4.50)

## 2017-12-31 LAB — CULTURE, GROUP A STREP (THRC)

## 2018-01-01 ENCOUNTER — Ambulatory Visit (INDEPENDENT_AMBULATORY_CARE_PROVIDER_SITE_OTHER): Payer: Medicare Other | Admitting: Psychology

## 2018-01-01 DIAGNOSIS — F4321 Adjustment disorder with depressed mood: Secondary | ICD-10-CM

## 2018-01-07 ENCOUNTER — Other Ambulatory Visit: Payer: Self-pay

## 2018-01-07 MED ORDER — IMIPRAMINE HCL 10 MG PO TABS: 10.0000 mg | ORAL_TABLET | Freq: Every day | ORAL | 1 refills | Status: DC

## 2018-01-21 ENCOUNTER — Telehealth: Payer: Self-pay | Admitting: Cardiovascular Disease

## 2018-01-21 ENCOUNTER — Other Ambulatory Visit: Payer: Self-pay

## 2018-01-21 DIAGNOSIS — E7800 Pure hypercholesterolemia, unspecified: Secondary | ICD-10-CM

## 2018-01-21 MED ORDER — ROSUVASTATIN CALCIUM 10 MG PO TABS: 10.0000 mg | ORAL_TABLET | Freq: Every day | ORAL | 3 refills | Status: DC

## 2018-01-21 NOTE — Telephone Encounter (Signed)
[name]- you can change rx to daily #90 rosuvastatin 10mg  with 3 refills

## 2018-01-21 NOTE — Telephone Encounter (Signed)
Pt calling requesting a refill on rosuvastatin 10 mg tablet, pt takes Monday, Wednesday and Friday. Pt stated that his cholesterol level was increase when he had his blood work done with Dr. Yong Channel. Pt stated that he started taking rosuvastatin every day and now he is out of his medication and would like a refill on rosuvastatin 10 mg tablet taken every day. Would Dr. [name] like to change this medication to pt taking everyday. Please address

## 2018-01-21 NOTE — Telephone Encounter (Signed)
Will forward message to patient's PCP, since he has dicussed this with patient already.

## 2018-01-21 NOTE — Telephone Encounter (Signed)
Prescription sent to pharmacy as requested.

## 2018-01-29 ENCOUNTER — Ambulatory Visit (INDEPENDENT_AMBULATORY_CARE_PROVIDER_SITE_OTHER): Payer: Medicare Other | Admitting: Psychology

## 2018-01-29 DIAGNOSIS — F4321 Adjustment disorder with depressed mood: Secondary | ICD-10-CM | POA: Diagnosis not present

## 2018-02-04 ENCOUNTER — Telehealth: Payer: Self-pay | Admitting: Family Medicine

## 2018-02-04 NOTE — Telephone Encounter (Signed)
See note.   Copied from [place] [id number]. Topic: General - Other >> Feb 04, 2018  3:31 PM Oneta Rack wrote: Relation to pt: self  Call back number: [phone]    Reason for call:   Patient requesting rivaroxaban (XARELTO) 20 MG TABS tablet please fax to [facility]  fax # [phone] Attention: Dr. Carie Caddy, informed patient please allow 48 to 72 hour turn around, please advise >> Feb 04, 2018  3:36 PM Oneta Rack wrote: Relation to pt: self  Call back number: [phone]    Reason for call:   Patient requesting rivaroxaban (XARELTO) 20 MG TABS tablet please fax to [facility]  fax # [phone] Attention: Dr. Carie Caddy, informed patient please allow 48 to 72 hour turn around, please advise

## 2018-02-06 ENCOUNTER — Other Ambulatory Visit: Payer: Self-pay

## 2018-02-06 ENCOUNTER — Encounter: Payer: Self-pay | Admitting: Family Medicine

## 2018-02-06 MED ORDER — RIVAROXABAN 20 MG PO TABS: 20.0000 mg | ORAL_TABLET | Freq: Every day | ORAL | 3 refills | Status: DC

## 2018-02-06 NOTE — Telephone Encounter (Signed)
Spoke with patient who confirmed that he needed the prescription faxed to Dr. Carie Caddy at the [facility]. I printed out the prescription and faxed it to the [place]. Patient is aware

## 2018-02-13 ENCOUNTER — Other Ambulatory Visit: Payer: Self-pay | Admitting: *Deleted

## 2018-02-13 DIAGNOSIS — C9110 Chronic lymphocytic leukemia of B-cell type not having achieved remission: Secondary | ICD-10-CM

## 2018-02-16 ENCOUNTER — Inpatient Hospital Stay (HOSPITAL_BASED_OUTPATIENT_CLINIC_OR_DEPARTMENT_OTHER): Payer: Medicare Other | Admitting: Hematology & Oncology

## 2018-02-16 ENCOUNTER — Inpatient Hospital Stay: Payer: Medicare Other | Attending: Hematology & Oncology

## 2018-02-16 ENCOUNTER — Ambulatory Visit (HOSPITAL_BASED_OUTPATIENT_CLINIC_OR_DEPARTMENT_OTHER)
Admission: RE | Admit: 2018-02-16 | Discharge: 2018-02-16 | Disposition: A | Discharge status: Home / Self Care | Payer: Medicare Other | Source: Ambulatory Visit | Attending: Hematology & Oncology | Admitting: Hematology & Oncology

## 2018-02-16 VITALS — BP 150/94 | HR 61 | Temp 98.5°F | Resp 16 | Wt 208.0 lb

## 2018-02-16 DIAGNOSIS — I700 Atherosclerosis of aorta: Secondary | ICD-10-CM | POA: Insufficient documentation

## 2018-02-16 DIAGNOSIS — M47812 Spondylosis without myelopathy or radiculopathy, cervical region: Secondary | ICD-10-CM | POA: Diagnosis not present

## 2018-02-16 DIAGNOSIS — Z7901 Long term (current) use of anticoagulants: Secondary | ICD-10-CM | POA: Diagnosis not present

## 2018-02-16 DIAGNOSIS — C9110 Chronic lymphocytic leukemia of B-cell type not having achieved remission: Secondary | ICD-10-CM

## 2018-02-16 DIAGNOSIS — R911 Solitary pulmonary nodule: Secondary | ICD-10-CM | POA: Insufficient documentation

## 2018-02-16 DIAGNOSIS — R590 Localized enlarged lymph nodes: Secondary | ICD-10-CM | POA: Insufficient documentation

## 2018-02-16 DIAGNOSIS — R918 Other nonspecific abnormal finding of lung field: Secondary | ICD-10-CM | POA: Insufficient documentation

## 2018-02-16 DIAGNOSIS — C9190 Lymphoid leukemia, unspecified not having achieved remission: Secondary | ICD-10-CM | POA: Diagnosis not present

## 2018-02-16 DIAGNOSIS — I2699 Other pulmonary embolism without acute cor pulmonale: Secondary | ICD-10-CM | POA: Diagnosis not present

## 2018-02-16 LAB — CMP (CANCER CENTER ONLY)
ALT: 25 U/L (ref 10–47)
AST: 25 U/L (ref 11–38)
Albumin: 4.1 g/dL (ref 3.5–5.0)
Alkaline Phosphatase: 58 U/L (ref 26–84)
Anion gap: 9 (ref 5–15)
BUN: 15 mg/dL (ref 7–22)
CO2: 31 mmol/L (ref 18–33)
Calcium: 9.1 mg/dL (ref 8.0–10.3)
Chloride: 104 mmol/L (ref 98–108)
Creatinine: 1.6 mg/dL — ABNORMAL HIGH (ref 0.60–1.20)
Glucose, Bld: 96 mg/dL (ref 73–118)
Potassium: 4.1 mmol/L (ref 3.3–4.7)
Sodium: 144 mmol/L (ref 128–145)
Total Bilirubin: 0.5 mg/dL (ref 0.2–1.6)
Total Protein: 7 g/dL (ref 6.4–8.1)

## 2018-02-16 LAB — CBC WITH DIFFERENTIAL (CANCER CENTER ONLY)
Basophils Absolute: 0 10*3/uL (ref 0.0–0.1)
Basophils Relative: 0 %
Eosinophils Absolute: 0.1 10*3/uL (ref 0.0–0.5)
Eosinophils Relative: 2 %
HCT: 41.5 % (ref 38.7–49.9)
Hemoglobin: 14.1 g/dL (ref 13.0–17.1)
Lymphocytes Relative: 41 %
Lymphs Abs: 1.6 10*3/uL (ref 0.9–3.3)
MCH: 32.3 pg (ref 28.0–33.4)
MCHC: 34 g/dL (ref 32.0–35.9)
MCV: 95 fL (ref 82.0–98.0)
Monocytes Absolute: 0.5 10*3/uL (ref 0.1–0.9)
Monocytes Relative: 13 %
Neutro Abs: 1.7 10*3/uL (ref 1.5–6.5)
Neutrophils Relative %: 44 %
Platelet Count: 136 10*3/uL — ABNORMAL LOW (ref 145–400)
RBC: 4.37 MIL/uL (ref 4.20–5.70)
RDW: 12.9 % (ref 11.1–15.7)
WBC Count: 3.9 10*3/uL — ABNORMAL LOW (ref 4.0–10.0)

## 2018-02-16 LAB — LACTATE DEHYDROGENASE: LDH: 323 U/L — ABNORMAL HIGH (ref 125–245)

## 2018-02-16 MED ORDER — IOPAMIDOL (ISOVUE-300) INJECTION 61%
100.0000 mL | Freq: Once | INTRAVENOUS | Status: AC | PRN
  Administered 2018-02-16: 80 mL via INTRAVENOUS

## 2018-02-16 NOTE — Progress Notes (Signed)
Hematology and Oncology Follow Up Visit  [name] [facility] [id number] Sep 13, 1946 72 y.o. 02/16/2018   Principle Diagnosis:   Right lower lobe pulmonary nodule- non-malignant  Recurrent pulmonary embolism  Progressive  Current Therapy:    Xarelto 20 mg by mouth daily       Rituxan/bendamustine-s/p cycle #2 - given 06/18/2017      Interim History:  Mr.  [name] is back for follow-up.  He is doing quite well.  We saw him back in January.  Since then, he is had no complaints.  We did do a CT scan today.  This did not show any adenopathy in his body.  He has not noted any palpable lymph glands.  Has had no cough or shortness of breath.  He has had no rashes.  Is had no leg swelling.  He has had no fever.  There is been no headaches.  Overall, his performance status is ECOG 0.  Medications:  Current Outpatient Medications:  .  benzonatate (TESSALON) 100 MG capsule, Take 2 capsules (200 mg total) by mouth every 8 (eight) hours., Disp: 30 capsule, Rfl: 0 .  cholecalciferol (VITAMIN D) 1000 units tablet, Take 1,000 Units by mouth daily., Disp: , Rfl:  .  dextromethorphan (DELSYM) 30 MG/5ML liquid, Take 2.5 mLs (15 mg total) by mouth 3 (three) times daily., Disp: 89 mL, Rfl: 0 .  diclofenac sodium (VOLTAREN) 1 % GEL, Apply 2 g topically as needed., Disp: , Rfl:  .  FLUoxetine (PROZAC) 20 MG capsule, Take 1 capsule (20 mg total) by mouth daily., Disp: 30 capsule, Rfl: 5 .  imipramine (TOFRANIL) 10 MG tablet, Take 1 tablet (10 mg total) by mouth at bedtime., Disp: 90 tablet, Rfl: 1 .  LORazepam (ATIVAN) 0.5 MG tablet, TAKE 1 TABLET EVERY DAY AS NEEDED FOR ANXIETY, Disp: 30 tablet, Rfl: 5 .  meclizine (ANTIVERT) 25 MG tablet, Take 1 tablet (25 mg total) by mouth 3 (three) times daily as needed for dizziness., Disp: 30 tablet, Rfl: 0 .  metoprolol succinate (TOPROL-XL) 25 MG 24 hr tablet, Take 1 tablet (25 mg total) by mouth daily., Disp: 30 tablet, Rfl: 8 .  rivaroxaban (XARELTO) 20 MG TABS  tablet, Take 1 tablet (20 mg total) by mouth daily with supper., Disp: 90 tablet, Rfl: 3 .  rosuvastatin (CRESTOR) 10 MG tablet, Take 1 tablet (10 mg total) by mouth daily., Disp: 90 tablet, Rfl: 3  Allergies:  Allergies  Allergen Reactions  . Hydrocodone-Acetaminophen Shortness Of Breath  . Oxycodone Hcl Shortness Of Breath  . Telmisartan-Hctz Shortness Of Breath and Palpitations    Dizziness, too  . Ramipril   . Aspirin Other (See Comments) and Rash    Other Reaction: confusion Can't take high dose  . Atorvastatin Other (See Comments)    myalgia  . Buspirone Hcl Other (See Comments)    headache  . Codeine Nausea Only and Other (See Comments)    Other Reaction: confusion  . Codeine Phosphate Itching and Nausea Only  . Hydrocodone-Acetaminophen Nausea Only and Other (See Comments)    Other Reaction: confusion  . Morphine Sulfate Itching  . Paroxetine Other (See Comments)    Paresthesias: R arm, R leg, r side of face  . Rabeprazole Other (See Comments)    headache    Past Medical History, Surgical history, Social history, and Family History were reviewed and updated.  Review of Systems: Review of Systems  Constitutional: Negative.   HENT: Negative.   Eyes: Negative.   Respiratory: Negative.  Cardiovascular: Negative.   Gastrointestinal: Negative.   Genitourinary: Negative.   Musculoskeletal: Negative.   Skin: Negative.   Neurological: Negative.   Endo/Heme/Allergies: Negative.   Psychiatric/Behavioral: Negative.     Physical Exam:  weight is 208 lb (94.3 kg). His oral temperature is 98.5 F (36.9 C). His blood pressure is 150/94 (abnormal) and his pulse is 61. His respiration is 16 and oxygen saturation is 100%.   Physical Exam  Constitutional: He is oriented to person, place, and time.  HENT:  Head: Normocephalic and atraumatic.  Mouth/Throat: Oropharynx is clear and moist.  Eyes: Pupils are equal, round, and reactive to light. EOM are normal.  Neck: Normal  range of motion.  Cardiovascular: Normal rate, regular rhythm and normal heart sounds.  Pulmonary/Chest: Effort normal and breath sounds normal.  Abdominal: Soft. Bowel sounds are normal.  Musculoskeletal: Normal range of motion. He exhibits no edema, tenderness or deformity.  Lymphadenopathy:    He has no cervical adenopathy.  Neurological: He is alert and oriented to person, place, and time.  Skin: Skin is warm and dry. No rash noted. No erythema.  Psychiatric: He has a normal mood and affect. His behavior is normal. Judgment and thought content normal.  Vitals reviewed.   Lab Results  Component Value Date   WBC 3.9 (L) 02/16/2018   HGB 14.6 09/02/2017   HCT 41.5 02/16/2018   MCV 95.0 02/16/2018   PLT 136 (L) 02/16/2018     Chemistry      Component Value Date/Time   NA 144 02/16/2018 1334   NA 145 07/22/2017 0756   NA 139 06/27/2016 1410   K 4.1 02/16/2018 1334   K 4.3 07/22/2017 0756   K 5.1 06/27/2016 1410   CL 104 02/16/2018 1334   CL 105 07/22/2017 0756   CO2 31 02/16/2018 1334   CO2 30 07/22/2017 0756   CO2 28 06/27/2016 1410   BUN 15 02/16/2018 1334   BUN 17 07/22/2017 0756   BUN 20.9 06/27/2016 1410   CREATININE 1.60 (H) 02/16/2018 1334   CREATININE 1.6 (H) 07/22/2017 0756   CREATININE 1.6 (H) 06/27/2016 1410      Component Value Date/Time   CALCIUM 9.1 02/16/2018 1334   CALCIUM 9.6 07/22/2017 0756   CALCIUM 9.6 06/27/2016 1410   ALKPHOS 58 02/16/2018 1334   ALKPHOS 59 07/22/2017 0756   ALKPHOS 62 06/27/2016 1410   AST 25 02/16/2018 1334   AST 18 06/27/2016 1410   ALT 25 02/16/2018 1334   ALT 24 07/22/2017 0756   ALT 24 06/27/2016 1410   BILITOT 0.5 02/16/2018 1334   BILITOT 0.46 06/27/2016 1410         Impression and Plan: [name] is 72 year-old African-American gentleman. He has recurrent pulmonary embolism. I think he needs lifelong anticoagulation.He is doing very well on Xarelto. He enjoys being on Xarelto.  For right now, I think we  can now get him back in 6 months.  I think this would be reasonable.  I do not see that we have to do any additional scans on him unless he has symptoms.       Volanda Napoleon, MD 4/8/20194:18 PM

## 2018-02-17 LAB — BETA 2 MICROGLOBULIN, SERUM: Beta-2 Microglobulin: 1.9 mg/L (ref 0.6–2.4)

## 2018-02-23 ENCOUNTER — Other Ambulatory Visit: Payer: Self-pay

## 2018-02-23 ENCOUNTER — Ambulatory Visit: Payer: Self-pay | Admitting: Hematology & Oncology

## 2018-02-26 ENCOUNTER — Other Ambulatory Visit: Payer: Self-pay

## 2018-02-26 ENCOUNTER — Ambulatory Visit (INDEPENDENT_AMBULATORY_CARE_PROVIDER_SITE_OTHER): Payer: Medicare Other | Admitting: Psychology

## 2018-02-26 ENCOUNTER — Telehealth: Payer: Self-pay | Admitting: Family Medicine

## 2018-02-26 DIAGNOSIS — F4321 Adjustment disorder with depressed mood: Secondary | ICD-10-CM | POA: Diagnosis not present

## 2018-02-26 MED ORDER — LORAZEPAM 0.5 MG PO TABS: ORAL_TABLET | ORAL | 5 refills | Status: DC

## 2018-02-26 NOTE — Telephone Encounter (Signed)
MEDICATION: LORazepam (ATIVAN) 0.5 MG tablet  PHARMACY:   [id number] - [place], [place] - [address] AT [place] [phone] (Phone) [phone] (Fax)       IS THIS A 90 DAY SUPPLY : no  IS PATIENT OUT OF MEDICATION:  IF NOT; HOW MUCH IS LEFT:   LAST APPOINTMENT DATE: 12/23/17  NEXT APPOINTMENT DATE:@Visit  date not found  OTHER COMMENTS:    **Let patient know to contact pharmacy at the end of the day to make sure medication is ready. **  ** Please notify patient to allow 48-72 hours to process**  **Encourage patient to contact the pharmacy for refills or they can request refills through [facility]**

## 2018-02-26 NOTE — Telephone Encounter (Signed)
Prescription sent to pharmacy as requested.

## 2018-03-18 NOTE — Progress Notes (Signed)
Subjective:   [name] is a 72 y.o. male who presents for Medicare Annual/Subsequent preventive examination.  Lives in 3 story single family house. Lives alone.   Review of Systems:  No ROS.  Medicare Wellness Visit. Additional risk factors are reflected in the social history.  Sleeps 7-8 hrs/night. Wakes up feeling rested. Gets up to use restroom once/night. Naps 2x/week.   Cardiac Risk Factors include: advanced age (>38mn, >>76women);dyslipidemia;hypertension;male gender     Objective:    Vitals: BP 110/60 (BP Location: Right Arm, Patient Position: Sitting, Cuff Size: Normal)   Pulse 77   Resp 16   Ht _0  (1.778 m)   Wt 206 lb (93.4 kg)   SpO2 97%   BMI 29.56 kg/m   Body mass index is 29.56 kg/m.  Advanced Directives 03/20/2018 02/16/2018 11/24/2017 09/02/2017 07/22/2017 06/17/2017 05/21/2017  Does Patient Have a Medical Advance Directive? Yes Yes Yes No Yes Yes Yes  Type of AParamedicof AAliceLiving will HSuquamishLiving will HBuena VistaLiving will - HOleanLiving will HEastonLiving will HFowlervilleLiving will  Does patient want to make changes to medical advance directive? No - Patient declined - - - - - -  Copy of HLathropin Chart? No - copy requested - No - copy requested - No - copy requested - No - copy requested  Would patient like information on creating a medical advance directive? - - No - Patient declined No - Patient declined - - -    Tobacco Social History   Tobacco Use  Smoking Status Former Smoker  . Packs/day: 1.00  . Years: 12.00  . Pack years: 12.00  . Types: Cigarettes  . Start date: 11/28/1968  . Last attempt to quit: 01/29/1981  . Years since quitting: 37.1  Smokeless Tobacco Never Used  Tobacco Comment   quit 72yo      Counseling given: Not Answered Comment: quit 72yo   Past Medical  History:  Diagnosis Date  . Anticoagulants causing adverse effect in therapeutic use   . Anxiety    Paxil seemed to cause odd neuro side effects; better on prozac as of 09/2015  . Aortic regurgitation 04/2007 surgery   [facility] . Cervical spondylosis    ESI by Dr. WJacelyn [facility]. 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 Chronic lymphocytic leukemia (CLL), B-cell ([place] approx 2012   stable on f/u's with Dr. EMarin Olp(most recent 07/2017)  . History of prostate cancer 2003   Rad prost  . History of pulmonary embolism 2011; 10/2014   Recurrence off of anticoagulation 10/2014: per hem/onc (Dr. EMarin Olp pt needs lifelong anticoagulation (hypercoag w/u neg).  . Hyperlipidemia    statin-intolerant, except for crestor low dose.  .Marland KitchenHypertension   . Migraine syndrome   . Mitral regurgitation   . PAF (paroxysmal atrial fibrillation) (HL[place]   . Panic attacks    "           "             "                  "                  "                       "                             "                         .  Pulmonary nodule, right 2015   RLL: resected at [facility] --VATS; Benign RLL nodule (fungal and AFB stains neg).  Plan is for [facility] thoracic surg to do f/u o/v & CT chest 1 yr   . Right-sided headache 07/2016   Primary stabbing HA or migraine per neuro (Dr. Tomi Likens).  Gabapentin and topamax not tolerated.  These resolved spontaneously.   Past Surgical History:  Procedure Laterality Date  . AORTIC VALVE REPLACEMENT  2011   [facility]  (bioprosthetic)  . CARDIAC CATHETERIZATION  04/2010   Normal coronaries.  . Carotid dopplers  08/2015   NORMAL  . CATARACT EXTRACTION     L 12/06/09   R 09/14/09  . COLONOSCOPY  01/26/13   tic's, o/w normal.  ([facility])--recall 10 yrs.  Marland Kitchen PENILE PROSTHESIS IMPLANT    . PROSTATECTOMY  04/2002   for prostate cancer  . Pulmonary nodule resection  Fall/winter 2016   [facility]  . SHOULDER SURGERY     rotator cuff on both arms   . TRANSTHORACIC ECHOCARDIOGRAM   09/20/2014; 03/2016; 03/2017   2015-mild LVH, EF 50-55%, wall motion nl, grade I diast dysfxn, prosth aort valve good,transaortic gradients decreased compared to echo 11/2013.   03/2016: EF 55-60%, normal LV function, severe LVH, normal diast fxn, mild increase in AV gradient compared to 09/2014 echo.  2018: EF 55-60%, normal LV fxn, grd II DD, AV stable/gradient's stable.   Family History  Problem Relation Age of Onset  . Cancer Sister        uterine  . Colon cancer Sister 18  . Stroke Mother   . Prostate cancer Father   . Stroke Sister   . Stroke Brother   . Heart attack Neg Hx    Social History   Socioeconomic History  . Marital status: Single    Spouse name: Not on file  . Number of children: Not on file  . Years of education: Not on file  . Highest education level: Not on file  Occupational History    Comment: Retired - Tobacco   Social Needs  . Financial resource strain: Not on file  . Food insecurity:    Worry: Not on file    Inability: Not on file  . Transportation needs:    Medical: Not on file    Non-medical: Not on file  Tobacco Use  . Smoking status: Former Smoker    Packs/day: 1.00    Years: 12.00    Pack years: 12.00    Types: Cigarettes    Start date: 11/28/1968    Last attempt to quit: 01/29/1981    Years since quitting: 37.1  . Smokeless tobacco: Never Used  . Tobacco comment: quit 72 yo   Substance and Sexual Activity  . Alcohol use: Yes    Alcohol/week: 0.0 oz    Comment: occasional  . Drug use: No  . Sexual activity: Not on file  Lifestyle  . Physical activity:    Days per week: Not on file    Minutes per session: Not on file  . Stress: Not on file  Relationships  . Social connections:    Talks on phone: Not on file    Gets together: Not on file    Attends religious service: Not on file    Active member of club or organization: Not on file    Attends meetings of clubs or organizations: Not on file    Relationship status: Not on file  Other  Topics Concern  . Not on file  Social History Narrative   Single. No children. Lives alone      Retired from [employer].       Hobbies: travel, Scientist, product/process development, enjoys being outdoors including hiking    Outpatient Encounter Medications as of 03/20/2018  Medication Sig  . cholecalciferol (VITAMIN D) 1000 units tablet Take 1,000 Units by mouth daily.  . diclofenac sodium (VOLTAREN) 1 % GEL Apply 2 g topically as needed.  Marland Kitchen FLUoxetine (PROZAC) 20 MG capsule Take 1 capsule (20 mg total) by mouth daily.  Marland Kitchen imipramine (TOFRANIL) 10 MG tablet Take 1 tablet (10 mg total) by mouth at bedtime.  Marland Kitchen LORazepam (ATIVAN) 0.5 MG tablet TAKE 1 TABLET EVERY DAY AS NEEDED FOR ANXIETY  . meclizine (ANTIVERT) 25 MG tablet Take 1 tablet (25 mg total) by mouth 3 (three) times daily as needed for dizziness.  . metoprolol succinate (TOPROL-XL) 25 MG 24 hr tablet Take 1 tablet (25 mg total) by mouth daily.  . rivaroxaban (XARELTO) 20 MG TABS tablet Take 1 tablet (20 mg total) by mouth daily with supper.  . rosuvastatin (CRESTOR) 10 MG tablet Take 1 tablet (10 mg total) by mouth daily.  . [DISCONTINUED] benzonatate (TESSALON) 100 MG capsule Take 2 capsules (200 mg total) by mouth every 8 (eight) hours.  . [DISCONTINUED] dextromethorphan (DELSYM) 30 MG/5ML liquid Take 2.5 mLs (15 mg total) by mouth 3 (three) times daily.   No facility-administered encounter medications on file as of 03/20/2018.     Activities of Daily Living In your present state of health, do you have any difficulty performing the following activities: 03/20/2018  Hearing? N  Vision? N  Difficulty concentrating or making decisions? N  Walking or climbing stairs? N  Dressing or bathing? N  Doing errands, shopping? N  Preparing Food and eating ? N  Using the Toilet? N  In the past six months, have you accidently leaked urine? N  Do you have problems with loss of bowel control? N  Managing your Medications? N  Managing  your Finances? N  Housekeeping or managing your Housekeeping? N  Some recent data might be hidden    Patient Care Team: [name], MD as PCP - General (Family Medicine) Ladell Pier, MD as Consulting Physician (Oncology) Josue Hector, MD as Consulting Physician (Cardiology) Chesley Mires, MD as Consulting Physician (Pulmonary Disease) Volanda Napoleon, MD as Consulting Physician (Oncology) Pieter Partridge, DO as Consulting Physician (Neurology) Everitt Amber, MD as Consulting Physician (Ophthalmology) Rutherford Guys, MD as Consulting Physician (Ophthalmology)   Assessment:   This is a routine wellness examination for M[place]az.  Exercise Activities and Dietary recommendations Current Exercise Habits: Structured exercise class, Time (Minutes): > 60, Frequency (Times/Week): 4, Weekly Exercise (Minutes/Week): 0, Intensity: Moderate, Exercise limited by: None identified  Eats 3 meals/day. Occasionally uses Super Beet powder. Breakfast: cereal, protein shakes, coffee Lunch: Salad or sandwich Dinner: Chicken, fish, lamb, vegetables  Snacks: fruit  Dessert: rarely.   Goals    . Maintain current health       Fall Risk Fall Risk  03/20/2018 05/21/2017 02/21/2017 11/22/2016 06/27/2016  Falls in the past year? _0   Number falls in past yr: - - - - -  Risk for fall due to : - - - - -   Depression Screen PHQ 2/9 Scores 03/20/2018 02/21/2017 11/22/2016 11/15/2015  PHQ - 2 Score 0 0 0 0  PHQ- 9 Score 0 - - -  PHQ9 score 0.  Patient denies signs and symptoms of depression. 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 Total tie spent on topic was 9 minutes.  Cognitive Function MMSE - Mini Mental State Exam 02/21/2017  Not completed: (No Data)   Ad8 score reviewed for issues:  Issues making decisions: no  Less interest in hobbies / activities:no  Repeats questions, stories (family complaining):no  Trouble using ordinary gadgets (microwave, computer, phone):no  Forgets the  month or year: no  Mismanaging finances: no  Remembering appts:no  Daily problems with thinking and/or memory:no Ad8 score is=0 Plays Word Game on phone.        Immunization History  Administered Date(s) Administered  . Influenza Split 08/16/2011, 08/14/2012  . Influenza Whole 08/17/2010  . Influenza, High Dose Seasonal PF 08/23/2013, 08/29/2016, 08/11/2017  . Influenza,inj,Quad PF,6+ Mos 09/05/2014, 08/02/2015  . Pneumococcal Conjugate-13 01/23/2015  . Pneumococcal Polysaccharide-23 03/15/2011  . Td 04/16/2007  . Zoster 08/16/2011   Screening Tests Health Maintenance  Topic Date Due  . Hepatitis C Screening  1946-10-23  . TETANUS/TDAP  03/21/2019 (Originally 04/15/2017)  . INFLUENZA VACCINE  06/11/2018  . COLONOSCOPY  01/27/2023  . PNA vac Low Risk Adult  Completed      Plan:   Follow up with PCP as directed.  I have personally reviewed and noted the following in the patient's chart:   . Medical and social history . Use of alcohol, tobacco or illicit drugs  . Current medications and supplements . Functional ability and status . Nutritional status . Physical activity . Advanced directives . List of other physicians . Hospitalizations, surgeries, and ER visits in previous 12 months . Vitals . Screenings to include cognitive, depression, and falls . Referrals and appointments  In addition, I have reviewed and discussed with patient certain preventive protocols, quality metrics, and best practice recommendations. A written personalized care plan for preventive services as well as general preventive health recommendations were provided to patient.     Williemae Area, RN  03/20/2018

## 2018-03-20 ENCOUNTER — Encounter: Payer: Self-pay | Admitting: *Deleted

## 2018-03-20 ENCOUNTER — Ambulatory Visit (INDEPENDENT_AMBULATORY_CARE_PROVIDER_SITE_OTHER): Payer: Medicare Other | Admitting: *Deleted

## 2018-03-20 VITALS — BP 110/60 | HR 77 | Resp 16 | Ht 70.0 in | Wt 206.0 lb

## 2018-03-20 DIAGNOSIS — Z1331 Encounter for screening for depression: Secondary | ICD-10-CM

## 2018-03-20 DIAGNOSIS — Z1159 Encounter for screening for other viral diseases: Secondary | ICD-10-CM

## 2018-03-20 DIAGNOSIS — Z0000 Encounter for general adult medical examination without abnormal findings: Secondary | ICD-10-CM

## 2018-03-20 NOTE — Patient Instructions (Addendum)
Mr. Pink ,  Bring a copy of your living will and/or healthcare power of attorney to your next office visit.  Thank you for taking time to come for your Medicare Wellness Visit. I appreciate your ongoing commitment to your health goals. Please review the following plan we discussed and let me know if I can assist you in the future.   These are the goals we discussed: Goals    . Maintain current health       This is a list of the screening recommended for you and due dates:  Health Maintenance  Topic Date Due  .  Hepatitis C: One time screening is recommended by Center for Disease Control  (CDC) for  adults born from [birth year] through 1965.   07-Jun-1946  . Tetanus Vaccine  03/21/2019*  . Flu Shot  06/11/2018  . Colon Cancer Screening  01/27/2023  . Pneumonia vaccines  Completed  *Topic was postponed. The date shown is not the original due date.   Preventive Care for Adults  A healthy lifestyle and preventive care can promote health and wellness. Preventive health guidelines for adults include the following key practices.  . A routine yearly physical is a good way to check with your health care provider about your health and preventive screening. It is a chance to share any concerns and updates on your health and to receive a thorough exam.  . Visit your dentist for a routine exam and preventive care every 6 months. Brush your teeth twice a day and floss once a day. Good oral hygiene prevents tooth decay and gum disease.  . The frequency of eye exams is based on your age, health, family medical history, use  of contact lenses, and other factors. Follow your health care provider's recommendations for frequency of eye exams.  . Eat a healthy diet. Foods like vegetables, fruits, whole grains, low-fat dairy products, and lean protein foods contain the nutrients you need without too many calories. Decrease your intake of foods high in solid fats, added sugars, and salt. Eat the right amount of  calories for you. Get information about a proper diet from your health care provider, if necessary.  . Regular physical exercise is one of the most important things you can do for your health. Most adults should get at least 150 minutes of moderate-intensity exercise (any activity that increases your heart rate and causes you to sweat) each week. In addition, most adults need muscle-strengthening exercises on 2 or more days a week.  Silver Sneakers may be a benefit available to you. To determine eligibility, you may visit the website: www.silversneakers.com or contact program at 805-334-5717 Mon-Fri between 8AM-8PM.   . Maintain a healthy weight. The body mass index (BMI) is a screening tool to identify possible weight problems. It provides an estimate of body fat based on height and weight. Your health care provider can find your BMI and can help you achieve or maintain a healthy weight.   For adults 20 years and older: ? A BMI below 18.5 is considered underweight. ? A BMI of 18.5 to 24.9 is normal. ? A BMI of 25 to 29.9 is considered overweight. ? A BMI of 30 and above is considered obese.   . Maintain normal blood lipids and cholesterol levels by exercising and minimizing your intake of saturated fat. Eat a balanced diet with plenty of fruit and vegetables. Blood tests for lipids and cholesterol should begin at age 20 and be repeated every 5 years. If  your lipid or cholesterol levels are high, you are over 50, or you are at high risk for heart disease, you may need your cholesterol levels checked more frequently. Ongoing high lipid and cholesterol levels should be treated with medicines if diet and exercise are not working.  . If you smoke, find out from your health care provider how to quit. If you do not use tobacco, please do not start.  . If you choose to drink alcohol, please do not consume more than 2 drinks per day. One drink is considered to be 12 ounces (355 mL) of beer, 5 ounces  (148 mL) of wine, or 1.5 ounces (44 mL) of liquor.  . If you are 68-51 years old, ask your health care provider if you should take aspirin to prevent strokes.  . Use sunscreen. Apply sunscreen liberally and repeatedly throughout the day. You should seek shade when your shadow is shorter than you. Protect yourself by wearing long sleeves, pants, a wide-brimmed hat, and sunglasses year round, whenever you are outdoors.  . Once a month, do a whole body skin exam, using a mirror to look at the skin on your back. Tell your health care provider of new moles, moles that have irregular borders, moles that are larger than a pencil eraser, or moles that have changed in shape or color.

## 2018-03-20 NOTE — Progress Notes (Signed)
PCP notes:   Health maintenance: Hepatitis C Screening: Ordered today.  Abnormal screenings: None.    Patient concerns: Xarelto is being prescribed by [place]. Wanted Dr Yong Channel to know.   Nurse concerns: None.    Next PCP appt: 03/27/18.

## 2018-03-20 NOTE — Progress Notes (Signed)
I have reviewed and agree with note, evaluation, plan.  Thank you for the update on Xarelto prescription.  I hope it is cheaper through the [place] for him.   Garret Reddish, MD

## 2018-03-22 LAB — HEPATITIS C ANTIBODY
Hepatitis C Ab: NONREACTIVE
SIGNAL TO CUT-OFF: 0.01 (ref <1.00)

## 2018-03-25 ENCOUNTER — Other Ambulatory Visit (HOSPITAL_COMMUNITY): Payer: Self-pay

## 2018-03-26 ENCOUNTER — Ambulatory Visit: Payer: Self-pay | Admitting: Psychology

## 2018-03-27 ENCOUNTER — Encounter: Payer: Self-pay | Admitting: Family Medicine

## 2018-03-27 ENCOUNTER — Ambulatory Visit: Payer: Medicare Other | Admitting: Family Medicine

## 2018-03-27 VITALS — BP 110/78 | HR 81 | Temp 98.2°F | Ht 70.0 in | Wt 205.8 lb

## 2018-03-27 DIAGNOSIS — R42 Dizziness and giddiness: Secondary | ICD-10-CM

## 2018-03-27 DIAGNOSIS — F411 Generalized anxiety disorder: Secondary | ICD-10-CM

## 2018-03-27 NOTE — Progress Notes (Signed)
Subjective:  [name] is a 72 y.o. year old very pleasant male patient who presents for/with See problem oriented charting ROS- dizziness much improved. No chest pain. Blurry vision stable. No edema.    Past Medical History-  Patient Active Problem List   Diagnosis Date Noted  . S/P Bioprosthetic AVR (aortic valve replacement) in 2011 03/22/2015    Priority: High  . Recurrent pulmonary embolism ([place]) 11/09/2014    Priority: High  . Chronic lymphocytic leukemia ([place]) 11/11/2013    Priority: High  . Atrial fibrillation ([place]) 04/20/2007    Priority: High  . PROSTATE CANCER, HX OF 04/20/2007    Priority: High  . Cervical spondylosis     Priority: Medium  . Migraine 10/08/2010    Priority: Medium  . Insomnia 02/03/2008    Priority: Medium  . Anxiety state 09/06/2007    Priority: Medium  . HLD (hyperlipidemia) 04/20/2007    Priority: Medium  . Essential hypertension 04/20/2007    Priority: Medium  . Former smoker 08/29/2016    Priority: Low  . Lung nodule 11/02/2014    Priority: Low  . GERD 12/11/2009    Priority: Low  . MITRAL REGURGITATION 04/07/2009    Priority: Low  . BPPV (benign paroxysmal positional vertigo) 09/04/2017  . CKD (chronic kidney disease), stage III ([place]) 08/11/2017    Medications- reviewed and updated Current Outpatient Medications  Medication Sig Dispense Refill  . cholecalciferol (VITAMIN D) 1000 units tablet Take 1,000 Units by mouth daily.    . diclofenac sodium (VOLTAREN) 1 % GEL Apply 2 g topically as needed.    Marland Kitchen FLUoxetine (PROZAC) 20 MG capsule Take 1 capsule (20 mg total) by mouth daily. 30 capsule 5  . imipramine (TOFRANIL) 10 MG tablet Take 1 tablet (10 mg total) by mouth at bedtime. 90 tablet 1  . LORazepam (ATIVAN) 0.5 MG tablet TAKE 1 TABLET EVERY DAY AS NEEDED FOR ANXIETY 30 tablet 5  . meclizine (ANTIVERT) 25 MG tablet Take 1 tablet (25 mg total) by mouth 3 (three) times daily as needed for dizziness. 30 tablet 0  . metoprolol  succinate (TOPROL-XL) 25 MG 24 hr tablet Take 1 tablet (25 mg total) by mouth daily. 30 tablet 8  . rivaroxaban (XARELTO) 20 MG TABS tablet Take 1 tablet (20 mg total) by mouth daily with supper. 90 tablet 3  . rosuvastatin (CRESTOR) 10 MG tablet Take 1 tablet (10 mg total) by mouth daily. 90 tablet 3   No current facility-administered medications for this visit.     Objective: BP 110/78 (BP Location: Left Arm, Patient Position: Sitting, Cuff Size: Large)   Pulse 81   Temp 98.2 F (36.8 C) (Oral)   Ht '5\' 10"'$  (1.778 m)   Wt 205 lb 12.8 oz (93.4 kg)   SpO2 96%   BMI 29.53 kg/m  Gen: NAD, resting comfortably CV: regular rate no rubs or gallops Lungs: CTAB no crackles, wheeze, rhonchi Abdomen: soft/nontender/nondistended/normal bowel sounds.  Ext: no edema Skin: warm, dry Neuro: normal gait and speech  Assessment/Plan:  Anxiety state- likely causing dizziness now much improved S: Last visit we increased Prozac to 20 mg.  He was having dizzy episodes that worsened as he thought about them and improved as he rested or took a Lorazepam. GAD7 score was a 3 today. Did feel particularly anxious this morning.   Today, he states that dizzy episodes have cut down drastically- very sparingly. He is using ativan 0.5 (but cuts in half) probably 3x a  week- usually for anxiety not for dizzy spells at this point. He has also started accupuncture treatments which he has found helpful. He continues to find [place] about the most helpful thing for him  We planned to rule out arrhythmia with 24-hour cardiac monitor-this didn't end up getting done but not clear why- but at least his symptoms have resolved.   Continues to see Dr. Cheryln Manly- about once a month- we also discussed EMDR with his history of trauma from the [place].   TSH was normal at that time.Already had carotid duplex 09/11/17 and 1-39% stenosis of RICA and LICA with follow-up planned in 2020. Has had recent echocardiogram 03/19/17 which  appeared stable from 2017  He was also to see eye doctor for reported blurry vision-he was referred to ophthalmology by Dr. Gershon Crane.  He was also dealing with some headaches but cannot have MRI due to metal in the right side of bed from prior injury.  He saw Dr. Annamaria Boots- he said scar tissue on the retina was the cause- not bad enough for surgery.   A/P: anxiety improved on prozac '20mg'$ . Continue impipramine for sleep. Continue visits with Dr. Cheryln Manly monthly. We also discussed possibility of EMDR given PTSD from [place] time   Future Appointments  Date Time Provider [place]  04/08/2018  1:00 PM [facility] ECHO 1 [id number]  04/08/2018  2:00 PM Josue Hector, MD [facility]  04/23/2018  1:00 PM Oren Binet, PhD [facility] None  05/21/2018  1:00 PM Oren Binet, PhD [facility] None  08/17/2018 11:30 AM [facility] LAB [facility] None  08/17/2018 12:00 PM Volanda Napoleon, MD [facility] None  09/29/2018  1:15 PM [name], MD [facility]  03/23/2019  2:00 PM Williemae Area, RN [facility]   Return in about 6 months (around 09/27/2018) for physical, come fasting.  Return precautions advised.  Garret Reddish, MD

## 2018-03-27 NOTE — Patient Instructions (Signed)
Thanks for coming by. Glad you are doing better.

## 2018-03-28 NOTE — Assessment & Plan Note (Signed)
S: Last visit we increased Prozac to 20 mg.  He was having dizzy episodes that worsened as he thought about them and improved as he rested or took a Lorazepam. GAD7 score was a 3 today. Did feel particularly anxious this morning.   Today, he states that dizzy episodes have cut down drastically- very sparingly. He is using ativan 0.5 (but cuts in half) probably 3x a week- usually for anxiety not for dizzy spells at this point. He has also started accupuncture treatments which he has found helpful.   We planned to rule out arrhythmia with 24-hour cardiac monitor-this didn't end up getting done but not clear why- but at least his symptoms have resolved.   Continues to see Dr. Cheryln Manly- about once a month- we also discussed EMDR with his history of trauma from the [place].   TSH was normal at that time.Already had carotid duplex 09/11/17 and 1-39% stenosis of RICA and LICA with follow-up planned in 2020. Has had recent echocardiogram 03/19/17 which appeared stable from 2017  He was also to see eye doctor for reported blurry vision-he was referred to ophthalmology by Dr. Gershon Crane.  He was also dealing with some headaches but cannot have MRI due to metal in the right side of bed from prior injury.  He saw Dr. Annamaria Boots- he said scar tissue on the retina was the cause- not bad enough for surgery.   A/P: anxiety improved on prozac 20mg . Continue impipramine for sleep. Continue visits with Dr. Cheryln Manly monthly. We also discussed possibility of EMDR given PTSD from [employer] time

## 2018-04-01 ENCOUNTER — Encounter: Payer: Self-pay | Admitting: Family Medicine

## 2018-04-01 ENCOUNTER — Ambulatory Visit (INDEPENDENT_AMBULATORY_CARE_PROVIDER_SITE_OTHER): Payer: Medicare Other | Admitting: Psychology

## 2018-04-01 DIAGNOSIS — F4323 Adjustment disorder with mixed anxiety and depressed mood: Secondary | ICD-10-CM | POA: Diagnosis not present

## 2018-04-07 NOTE — Progress Notes (Signed)
Patient ID: [name], male   DOB: [birth year], 72 y.o.   MRN: [id number]    Cardiology Office Note   Date:  04/08/2018   ID:  [name], DOB 09/13/1946, MRN [id number]  PCP:  [name], MD  Cardiologist:  [name]     No chief complaint on file.    History of Present Illness:  72 y.o. post bioprothetic AVR Dr [name] July 2011 for severe AR. No CAD at cath prior to surgery. History of PAF, recurrent DVT and PE last 10/2014 on Xarelto for anticoagulation. Persistent LUE basilic / cephalic thrombus. Also history of HTN, HLD, CLL post chemoRx, prostate CA post radical prostatectomy and RLL lung nodule.   04/03/15  Had throascopic RLL wedge resection [place] Turned out to be benign granulomatous nodule And not cancer  CT 02/16/18 with new clustered nodules in right middle lobe ? Inflammation or infection and nonspecific stable LUL nodule.  CT neck 02/16/18 no recurrent leukemia.   Followed for observation at this point by Dr [name]   Has some anxiety issues that sometime make him appear dyspnic Going to [place] for a few weeks in July    Studies/Reports Reviewed Today:  TTE:  04/08/18 Study Conclusions mean gradient 21 mmHg peak 31 mmHg peak velocity 2.77 m/sec   - Left ventricle: The cavity size was normal. There was mild   concentric hypertrophy. Systolic function was normal. The   estimated ejection fraction was in the range of 60% to 65%. Left   ventricular diastolic function parameters were normal. - Aortic valve: Bio prosthetic stented AVR with stable gradients   since 2018 no peri valvular regurgitation DVI in normal range .32 - Mitral valve: There was mild regurgitation. - Left atrium: The atrium was moderately dilated. - Atrial septum: No defect or patent foramen ovale was identified.   Disscussed issues of lipid control for deteriorating tissue valves   Switching primary to Dr Yong Channel . 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 Anticoagulants causing adverse effect in therapeutic use   . Anxiety    Paxil seemed to cause odd neuro side effects; better on prozac as of 09/2015  . Aortic regurgitation 04/2007 surgery   Resolved with tissue AVR at [facility]  . Cervical spondylosis    ESI by [name] in Ortho. 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 Chronic lymphocytic leukemia (CLL), B-cell ([place]) approx 2012   stable on f/u's with [name] (most recent 07/2017)  . History of prostate cancer 2003   Rad prost  . History of pulmonary embolism 2011; 10/2014   Recurrence off of anticoagulation 10/2014: per hem/onc ([name]) pt needs lifelong anticoagulation (hypercoag w/u neg).  . Hyperlipidemia    statin-intolerant, except for crestor low dose.  Marland Kitchen Hypertension   . Migraine syndrome   . Mitral regurgitation   . PAF (paroxysmal atrial fibrillation) ([place])   . Panic attacks    "           "             "                  "                  "                       "                             "                         .  Pulmonary nodule, right 2015   RLL: resected at [facility] --VATS; Benign RLL nodule (fungal and AFB stains neg).  Plan is for [facility] thoracic surg to do f/u o/v & CT chest 1 yr   . Right-sided headache 07/2016   Primary stabbing HA or migraine per neuro (Dr. Tomi Likens).  Gabapentin and topamax not tolerated.  These resolved spontaneously.    Past Surgical History:  Procedure Laterality Date  . AORTIC VALVE REPLACEMENT  2011   [facility]  (bioprosthetic)  . CARDIAC CATHETERIZATION  04/2010   Normal coronaries.  . Carotid dopplers  08/2015   NORMAL  . CATARACT EXTRACTION     L 12/06/09   R 09/14/09  . COLONOSCOPY  01/26/13   tic's, o/w normal.  ([place])--recall 10 yrs.  Marland Kitchen PENILE PROSTHESIS IMPLANT    . PROSTATECTOMY  04/2002   for prostate cancer  .  Pulmonary nodule resection  Fall/winter 2016   [facility]  . SHOULDER SURGERY     rotator cuff on both arms   . TRANSTHORACIC ECHOCARDIOGRAM  09/20/2014; 03/2016; 03/2017   2015-mild LVH, EF 50-55%, wall motion nl, grade I diast dysfxn, prosth aort valve good,transaortic gradients decreased compared to echo 11/2013.   03/2016: EF 55-60%, normal LV function, severe LVH, normal diast fxn, mild increase in AV gradient compared to 09/2014 echo.  2018: EF 55-60%, normal LV fxn, grd II DD, AV stable/gradient's stable.     Current Outpatient Medications  Medication Sig Dispense Refill  . cholecalciferol (VITAMIN D) 1000 units tablet Take 1,000 Units by mouth daily.    . diclofenac sodium (VOLTAREN) 1 % GEL Apply 2 g topically as needed.    Marland Kitchen FLUoxetine (PROZAC) 20 MG capsule Take 1 capsule (20 mg total) by mouth daily. 30 capsule 5  . imipramine (TOFRANIL) 10 MG tablet Take 1 tablet (10 mg total) by mouth at bedtime. 90 tablet 1  . LORazepam (ATIVAN) 0.5 MG tablet TAKE 1 TABLET EVERY DAY AS NEEDED FOR ANXIETY 30 tablet 5  . meclizine (ANTIVERT) 25 MG tablet Take 1 tablet (25 mg total) by mouth 3 (three) times daily as needed for dizziness. 30 tablet 0  . metoprolol succinate (TOPROL-XL) 25 MG 24 hr tablet Take 1 tablet (25 mg total) by mouth daily. 30 tablet 8  . rivaroxaban (XARELTO) 20 MG TABS tablet Take 1 tablet (20 mg total) by mouth daily with supper. 90 tablet 3  . rosuvastatin (CRESTOR) 10 MG tablet Take 1 tablet (10 mg total) by mouth daily. 90 tablet 3   No current facility-administered medications for this visit.     Allergies:   Hydrocodone-acetaminophen; Oxycodone hcl; Telmisartan-hctz; Ramipril; Aspirin; Atorvastatin; Buspirone hcl; Codeine; Codeine phosphate; Hydrocodone-acetaminophen; Morphine sulfate; Paroxetine; and Rabeprazole    Social History:  The patient  reports that he quit smoking about 37 years ago. His smoking use included cigarettes. He started smoking about 49 years ago. He  has a 12.00 pack-year smoking history. He has never used smokeless tobacco. He reports that he drinks alcohol. He reports that he does not use drugs.   Family History:  The patient's family history includes Cancer in his sister; Colon cancer (age of onset: 7) in his sister; Prostate cancer in his father; Stroke in his brother, mother, and sister.    ROS:   Please see the history of present illness.   Review of Systems  All other systems reviewed and are negative.     PHYSICAL EXAM: VS:  BP 128/80   Pulse 76  Ht 5\' 10"  (1.778 m)   Wt 206 lb (93.4 kg)   SpO2 98%   BMI 29.56 kg/m     Wt Readings from Last 3 Encounters:  04/08/18 206 lb (93.4 kg)  03/27/18 205 lb 12.8 oz (93.4 kg)  03/20/18 206 lb (93.4 kg)    Affect appropriate Healthy:  appears stated age 38: normal Neck supple with no adenopathy JVP normal  Left bruits no thyromegaly Lungs clear with no wheezing and good diaphragmatic motion Heart:  S1/S2 SEM  murmur, no rub, gallop or click PMI normal Abdomen: benighn, BS positve, no tenderness, no AAA no bruit.  No HSM or HJR Distal pulses intact with no bruits No edema Neuro non-focal Skin warm and dry No muscular weakness    EKG:   03/22/15  NSR, HR 67, LAFB, RBBB, no change from prior tracing  04/02/16 SR rate 62 LAD RBBB LVH  06/18/17 SR rate 78 LAD RBBB LVH    Recent Labs: 12/30/2017: TSH 1.63 02/16/2018: ALT 25; BUN 15; Creatinine 1.60; Hemoglobin 14.1; Platelet Count 136; Potassium 4.1; Sodium 144    Lipid Panel    Component Value Date/Time   CHOL 222 (H) 08/11/2017 0843   TRIG 162.0 (H) 08/11/2017 0843   HDL 44.40 08/11/2017 0843   CHOLHDL 5 08/11/2017 0843   VLDL 32.4 08/11/2017 0843   LDLCALC 146 (H) 08/11/2017 0843   LDLDIRECT 169.0 03/30/2013 0936      ASSESSMENT AND PLAN:  Aortic insufficiency S/P Bioprosthetic AVR (aortic valve replacement) in 2011 Increased gradients on echo 03/19/17 mean 21 mmHg peak 36 mmHg continue anticoagulation.     DVI abnormal at .23  TTE today improved DVI .32 with stable mean gradient 21 mmHg peak 31 mmHg  History of DVT (deep vein thrombosis) and Recurrent pulmonary embolism Continue Xarelto. This is managed by heme/onc.   Paroxysmal atrial fibrillation Maintaining NSR.  Continue Toprol-XL and Xarelto.  Essential hypertension Borderline control.  Continue to monitor.  HLD (hyperlipidemia) Has had leg pain with statins in past. Taking crestor 3 x/week   Lung nodule  [facility] VATS at [facility]  Recent CT see above f/u [facility]   Carotid Bruit:  Left duplex no stenosis f/u 08/2018  Depression:  Better off paroxetine now on low dose prozac with tofranil  Headache:  F/u primary PRN tylenol ok has schrap metal on right side of head Cant have MRI  [id number]:ZHYQ with primary typically Cr around 1.6 on 02/16/18    BBB:  RBBB LAD post AVR ECG q 6 months follow for more advanced AV block   CLL:  F/u with Dr Jonette Eva post chemo observation recent CT scans including neck ok   Lab Results  Component Value Date   WBC 3.9 (L) 02/16/2018   HGB 14.1 02/16/2018   HCT 41.5 02/16/2018   MCV 95.0 02/16/2018   PLT 136 (L) 02/16/2018       F/U with me in 6 months    Jenkins Rouge

## 2018-04-08 ENCOUNTER — Ambulatory Visit: Payer: Medicare Other | Admitting: Cardiovascular Disease

## 2018-04-08 ENCOUNTER — Encounter: Payer: Self-pay | Admitting: Cardiovascular Disease

## 2018-04-08 ENCOUNTER — Other Ambulatory Visit: Payer: Self-pay

## 2018-04-08 ENCOUNTER — Ambulatory Visit (HOSPITAL_COMMUNITY): Payer: Medicare Other | Attending: Cardiovascular Disease

## 2018-04-08 VITALS — BP 128/80 | HR 76 | Ht 70.0 in | Wt 206.0 lb

## 2018-04-08 DIAGNOSIS — I480 Paroxysmal atrial fibrillation: Secondary | ICD-10-CM | POA: Insufficient documentation

## 2018-04-08 DIAGNOSIS — F419 Anxiety disorder, unspecified: Secondary | ICD-10-CM | POA: Diagnosis not present

## 2018-04-08 DIAGNOSIS — I119 Hypertensive heart disease without heart failure: Secondary | ICD-10-CM | POA: Diagnosis not present

## 2018-04-08 DIAGNOSIS — E785 Hyperlipidemia, unspecified: Secondary | ICD-10-CM | POA: Insufficient documentation

## 2018-04-08 DIAGNOSIS — I351 Nonrheumatic aortic (valve) insufficiency: Secondary | ICD-10-CM

## 2018-04-08 DIAGNOSIS — Z953 Presence of xenogenic heart valve: Secondary | ICD-10-CM | POA: Diagnosis not present

## 2018-04-08 DIAGNOSIS — Z86711 Personal history of pulmonary embolism: Secondary | ICD-10-CM | POA: Insufficient documentation

## 2018-04-08 DIAGNOSIS — I340 Nonrheumatic mitral (valve) insufficiency: Secondary | ICD-10-CM | POA: Diagnosis not present

## 2018-04-08 DIAGNOSIS — I359 Nonrheumatic aortic valve disorder, unspecified: Secondary | ICD-10-CM | POA: Diagnosis present

## 2018-04-08 NOTE — Patient Instructions (Signed)

## 2018-04-14 ENCOUNTER — Other Ambulatory Visit: Payer: Self-pay

## 2018-04-14 ENCOUNTER — Emergency Department (HOSPITAL_COMMUNITY)
Admission: EM | Admit: 2018-04-14 | Discharge: 2018-04-14 | Disposition: A | Discharge status: Home / Self Care | Payer: Medicare Other | Attending: Emergency Medicine | Admitting: Emergency Medicine

## 2018-04-14 ENCOUNTER — Encounter: Payer: Self-pay | Admitting: Family Medicine

## 2018-04-14 ENCOUNTER — Emergency Department (HOSPITAL_COMMUNITY): Payer: Medicare Other

## 2018-04-14 ENCOUNTER — Encounter (HOSPITAL_COMMUNITY): Payer: Self-pay | Admitting: Emergency Medicine

## 2018-04-14 DIAGNOSIS — F419 Anxiety disorder, unspecified: Secondary | ICD-10-CM | POA: Diagnosis not present

## 2018-04-14 DIAGNOSIS — R040 Epistaxis: Secondary | ICD-10-CM | POA: Insufficient documentation

## 2018-04-14 DIAGNOSIS — I129 Hypertensive chronic kidney disease with stage 1 through stage 4 chronic kidney disease, or unspecified chronic kidney disease: Secondary | ICD-10-CM | POA: Insufficient documentation

## 2018-04-14 DIAGNOSIS — N183 Chronic kidney disease, stage 3 (moderate): Secondary | ICD-10-CM | POA: Diagnosis not present

## 2018-04-14 DIAGNOSIS — Y92003 Bedroom of unspecified non-institutional (private) residence as the place of occurrence of the external cause: Secondary | ICD-10-CM | POA: Insufficient documentation

## 2018-04-14 DIAGNOSIS — S0121XA Laceration without foreign body of nose, initial encounter: Secondary | ICD-10-CM

## 2018-04-14 DIAGNOSIS — Z87891 Personal history of nicotine dependence: Secondary | ICD-10-CM | POA: Diagnosis not present

## 2018-04-14 DIAGNOSIS — S022XXA Fracture of nasal bones, initial encounter for closed fracture: Secondary | ICD-10-CM | POA: Insufficient documentation

## 2018-04-14 DIAGNOSIS — Y9384 Activity, sleeping: Secondary | ICD-10-CM | POA: Diagnosis not present

## 2018-04-14 DIAGNOSIS — W228XXA Striking against or struck by other objects, initial encounter: Secondary | ICD-10-CM | POA: Diagnosis not present

## 2018-04-14 DIAGNOSIS — Z7901 Long term (current) use of anticoagulants: Secondary | ICD-10-CM | POA: Diagnosis not present

## 2018-04-14 DIAGNOSIS — Z8546 Personal history of malignant neoplasm of prostate: Secondary | ICD-10-CM | POA: Diagnosis not present

## 2018-04-14 DIAGNOSIS — Y998 Other external cause status: Secondary | ICD-10-CM | POA: Insufficient documentation

## 2018-04-14 DIAGNOSIS — Z23 Encounter for immunization: Secondary | ICD-10-CM | POA: Insufficient documentation

## 2018-04-14 DIAGNOSIS — Z79899 Other long term (current) drug therapy: Secondary | ICD-10-CM | POA: Insufficient documentation

## 2018-04-14 DIAGNOSIS — Z952 Presence of prosthetic heart valve: Secondary | ICD-10-CM | POA: Diagnosis not present

## 2018-04-14 DIAGNOSIS — S0993XA Unspecified injury of face, initial encounter: Secondary | ICD-10-CM | POA: Diagnosis present

## 2018-04-14 MED ORDER — CEPHALEXIN 500 MG PO CAPS: 500.0000 mg | ORAL_CAPSULE | Freq: Two times a day (BID) | ORAL | 0 refills | Status: DC

## 2018-04-14 MED ORDER — ACETAMINOPHEN 500 MG PO TABS
1000.0000 mg | ORAL_TABLET | Freq: Once | ORAL | Status: AC
  Administered 2018-04-14: 1000 mg via ORAL
  Filled 2018-04-14: qty 2

## 2018-04-14 MED ORDER — TETANUS-DIPHTH-ACELL PERTUSSIS 5-2.5-18.5 LF-MCG/0.5 IM SUSP
0.5000 mL | Freq: Once | INTRAMUSCULAR | Status: AC
  Administered 2018-04-14: 0.5 mL via INTRAMUSCULAR
  Filled 2018-04-14: qty 0.5

## 2018-04-14 MED ORDER — CEPHALEXIN 500 MG PO CAPS
500.0000 mg | ORAL_CAPSULE | Freq: Once | ORAL | Status: AC
  Administered 2018-04-14: 500 mg via ORAL
  Filled 2018-04-14: qty 1

## 2018-04-14 MED ORDER — LIDOCAINE HCL (PF) 2 % IJ SOLN
INTRAMUSCULAR | Status: AC
  Filled 2018-04-14: qty 10

## 2018-04-14 NOTE — ED Notes (Signed)
Patient had clot come out of left nostril. Afterwards, patient starts bleeding continuously from left nostril. He is holding pressure to nasal area and leaning forward. Bleed is draining out of patient's mouth. Patient complains of feelings of choking. Dr Christy Gentles called to bedside. Nasal packing placed by Dr Christy Gentles, patient tolerated well. Patient now sitting with head of bed at 45 degrees. Small amount of bleeding coming from nose, patient able to clean himself.

## 2018-04-14 NOTE — ED Notes (Signed)
Bleeding controlled at discharge.
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
## 2018-04-14 NOTE — ED Provider Notes (Signed)
Packing in place, the patient is done well, there is no further bleeding at this time.  He will follow-up with your nose and throat.  Patient given follow-up information, given Tylenol prior to discharge, he declines any strong pain medications as he has an allergy to opiates.  There is no ongoing bleeding at this time   Noemi Chapel, MD 04/14/18 [phone]

## 2018-04-14 NOTE — ED Provider Notes (Signed)
[facility] EMERGENCY DEPARTMENT Provider Note   CSN: [id number] Arrival date & time: 04/14/18  0531     History   Chief Complaint Chief Complaint  Patient presents with  . Facial Injury    HPI [name] is a 72 y.o. male.  The history is provided by the patient.  Facial Injury  Mechanism of injury:  Fall Location:  Nose Pain details:    Quality:  Aching   Severity:  Moderate   Timing:  Constant   Progression:  Unchanged Relieved by:  Nothing Worsened by:  Nothing Associated symptoms: epistaxis and headaches   Associated symptoms: no loss of consciousness and no neck pain    Patient with history of aortic valve replacement, CLL, PE on Xarelto presents with facial injury.  He reports he had a dream jumped out of bed and hit his dresser with his face Denies LOC.  He now reports facial pain and nosebleed.  He also reports mild shoulder pain.  He has no other acute complaints  Past Medical History:  Diagnosis Date  . Anticoagulants causing adverse effect in therapeutic use   . Anxiety    Paxil seemed to cause odd neuro side effects; better on prozac as of 09/2015  . Aortic regurgitation 04/2007 surgery   Resolved with tissue AVR at [facility]  . Cervical spondylosis    ESI by [name] in Ortho. 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 Chronic lymphocytic leukemia (CLL), B-cell ([place]) approx 2012   stable on f/u's with [name] (most recent 07/2017)  . History of prostate cancer 2003   Rad prost  . History of pulmonary embolism 2011; 10/2014   Recurrence off of anticoagulation 10/2014: per hem/onc ([name]) pt needs lifelong anticoagulation (hypercoag w/u neg).  . Hyperlipidemia    statin-intolerant, except for crestor low dose.  Marland Kitchen Hypertension   . Migraine syndrome   . Mitral regurgitation   . PAF (paroxysmal atrial fibrillation) ([place])   . Panic attacks    "           "             "                  "                  "                       "                              "                         . Pulmonary nodule, right 2015   RLL: resected at [facility] --VATS; Benign RLL nodule (fungal and AFB stains neg).  Plan is for [facility] surg to do f/u o/v & CT chest 1 yr   . Right-sided headache 07/2016   Primary stabbing HA or migraine per neuro (Dr. Tomi Likens).  Gabapentin and topamax not tolerated.  These resolved spontaneously.    Patient Active Problem List   Diagnosis Date Noted  . BPPV (benign paroxysmal positional vertigo) 09/04/2017  . CKD (chronic kidney disease), stage III ([place]) 08/11/2017  . Former smoker 08/29/2016  . Cervical spondylosis   . S/P Bioprosthetic AVR (aortic valve replacement) in 2011 03/22/2015  . Recurrent pulmonary  embolism ([place]) 11/09/2014  . Lung nodule 11/02/2014  . Chronic lymphocytic leukemia ([place]) 11/11/2013  . Migraine 10/08/2010  . GERD 12/11/2009  . MITRAL REGURGITATION 04/07/2009  . Insomnia 02/03/2008  . Anxiety state 09/06/2007  . HLD (hyperlipidemia) 04/20/2007  . Essential hypertension 04/20/2007  . Atrial fibrillation ([place]) 04/20/2007  . PROSTATE CANCER, HX OF 04/20/2007    Past Surgical History:  Procedure Laterality Date  . AORTIC VALVE REPLACEMENT  2011   [facility]  (bioprosthetic)  . CARDIAC CATHETERIZATION  04/2010   Normal coronaries.  . Carotid dopplers  08/2015   NORMAL  . CATARACT EXTRACTION     L 12/06/09   R 09/14/09  . COLONOSCOPY  01/26/13   tic's, o/w normal.  ([place])--recall 10 yrs.  Marland Kitchen PENILE PROSTHESIS IMPLANT    . PROSTATECTOMY  04/2002   for prostate cancer  . Pulmonary nodule resection  Fall/winter 2016   [facility]  . SHOULDER SURGERY     rotator cuff on both arms   . TRANSTHORACIC ECHOCARDIOGRAM  09/20/2014; 03/2016; 03/2017   2015-mild LVH, EF 50-55%, wall motion nl, grade I diast dysfxn, prosth aort valve good,transaortic gradients decreased compared to echo 11/2013.   03/2016: EF 55-60%, normal LV function, severe LVH, normal diast fxn, mild increase in AV gradient compared to 09/2014 echo.   2018: EF 55-60%, normal LV fxn, grd II DD, AV stable/gradient's stable.        Home Medications    Prior to Admission medications   Medication Sig Start Date End Date Taking? Authorizing Provider  cholecalciferol (VITAMIN D) 1000 units tablet Take 1,000 Units by mouth daily.    [provider]  diclofenac sodium (VOLTAREN) 1 % GEL Apply 2 g topically as needed.    [provider]  FLUoxetine (PROZAC) 20 MG capsule Take 1 capsule (20 mg total) by mouth daily. 12/23/17   [name], MD  imipramine (TOFRANIL) 10 MG tablet Take 1 tablet (10 mg total) by mouth at bedtime. 01/07/18   [name], MD  LORazepam (ATIVAN) 0.5 MG tablet TAKE 1 TABLET EVERY DAY AS NEEDED FOR ANXIETY 02/26/18   [name], MD  meclizine (ANTIVERT) 25 MG tablet Take 1 tablet (25 mg total) by mouth 3 (three) times daily as needed for dizziness. 10/01/17   Vivi Barrack, MD  metoprolol succinate (TOPROL-XL) 25 MG 24 hr tablet Take 1 tablet (25 mg total) by mouth daily. 10/27/17   Josue Hector, MD  rivaroxaban (XARELTO) 20 MG TABS tablet Take 1 tablet (20 mg total) by mouth daily with supper. 02/06/18   [name], MD  rosuvastatin (CRESTOR) 10 MG tablet Take 1 tablet (10 mg total) by mouth daily. 01/21/18   [name], MD    Family History Family History  Problem Relation Age of Onset  . Cancer Sister        uterine  . Colon cancer Sister 80  . Stroke Mother   . Prostate cancer Father   . Stroke Sister   . Stroke Brother   . Heart attack Neg Hx     Social History Social History   Tobacco Use  . Smoking status: Former Smoker    Packs/day: 1.00    Years: 12.00    Pack years: 12.00    Types: Cigarettes    Start date: 11/28/1968    Last attempt to quit: 01/29/1981    Years since quitting: 37.2  . Smokeless tobacco: Never Used  . Tobacco comment: quit  72 yo   Substance Use Topics  . Alcohol use: Yes    Alcohol/week: 0.0 oz    Comment: occasional  .  Drug use: No     Allergies   Hydrocodone-acetaminophen; Oxycodone hcl; Telmisartan-hctz; Ramipril; Aspirin; Atorvastatin; Buspirone hcl; Codeine; Codeine phosphate; Hydrocodone-acetaminophen; Morphine sulfate; Paroxetine; and Rabeprazole   Review of Systems Review of Systems  HENT: Positive for nosebleeds.   Eyes: Negative for visual disturbance.  Cardiovascular: Negative for chest pain.  Musculoskeletal: Negative for neck pain.  Neurological: Positive for headaches. Negative for loss of consciousness.  All other systems reviewed and are negative.    Physical Exam Updated Vital Signs BP (!) 153/92 (BP Location: Right Arm)   Pulse 70   Temp 97.6 F (36.4 C) (Axillary)   Resp 20   Ht 1.778 m (5\' 10" )   Wt 91.2 kg (201 lb)   SpO2 97%   BMI 28.84 kg/m   Physical Exam  CONSTITUTIONAL: Well developed/well nourished HEAD: Fall abrasion to forehead, otherwise normocephalic/atraumatic EYES: EOMI/PERRL ENMT: Mucous membranes moist, there is no signs of any dental or oral injury, he has a laceration to the tip of his nose and the left nare, tenderness and swelling to nose  nECK: supple no meningeal signs SPINE/BACK:entire spine nontender CV: S1/S2 noted, no murmurs/rubs/gallops noted LUNGS: Lungs are clear to auscultation bilaterally, no apparent distress ABDOMEN: soft, nontender, no rebound or guarding, bowel sounds noted throughout abdomen GU:no cva tenderness NEURO: Pt is awake/alert/appropriate, moves all extremitiesx4.  No facial droop.   EXTREMITIES: pulses normal/equal, full ROM Mild tenderness with range of motion of right shoulder, no bruising or deformity. SKIN: warm, color normal PSYCH: no abnormalities of mood noted, alert and oriented to situation      Patient gave verbal permission to utilize photo for medical documentation only The image was not stored on any personal device  ED Treatments / Results  Labs (all labs ordered are listed, but only abnormal  results are displayed) Labs Reviewed - No data to display  EKG None  Radiology Ct Head Wo Contrast  Result Date: 04/14/2018 CLINICAL DATA:  Struck face on dresser. Forehead abrasions and epistaxis. History of headache, hypertension, prostate cancer, leukemia. EXAM: CT HEAD WITHOUT CONTRAST CT MAXILLOFACIAL WITHOUT CONTRAST TECHNIQUE: Multidetector CT imaging of the head and maxillofacial structures were performed using the standard protocol without intravenous contrast. Multiplanar CT image reconstructions of the maxillofacial structures were also generated. COMPARISON:  MRI of the head September 02, 2017. FINDINGS: CT HEAD FINDINGS BRAIN: No intraparenchymal hemorrhage, mass effect nor midline shift. The ventricles and sulci are normal for age. Patchy supratentorial white matter hypodensities within normal range for patient's age, though non-specific are most compatible with chronic small vessel ischemic disease. No acute large vascular territory infarcts. No abnormal extra-axial fluid collections. Basal cisterns are patent. VASCULAR: Mild calcific atherosclerosis of the carotid siphons. SKULL: No skull fracture. No significant scalp soft tissue swelling. OTHER: None. CT MAXILLOFACIAL FINDINGS OSSEOUS: The mandible is intact, the condyles are located. Acute nondisplaced bilateral distal nasal bone fractures. No destructive bony lesions. Advanced cervical spondylosis. ORBITS: Status post bilateral ocular lens implants. Orbital contents are nonacute. SINUSES: Small bilateral maxillary sinus air-fluid levels, potential hemosinus. Scattered mucosal retention cysts. Nasal septum mildly deviated to the right with small bony spur. Tiny metallic foreign body right lateral neck. SOFT TISSUES: Nasal soft tissue swelling with subcutaneous gas, no radiopaque foreign bodies. Mild calcific atherosclerosis carotid bifurcations. IMPRESSION: CT HEAD: 1. Negative non-contrast CT head for age. CT  MAXILLOFACIAL: 1. Acute  nondisplaced bilateral nasal bone fractures. Electronically Signed   By: Elon Alas M.D.   On: 04/14/2018 06:36   Ct Maxillofacial Wo Contrast  Result Date: 04/14/2018 CLINICAL DATA:  Struck face on dresser. Forehead abrasions and epistaxis. History of headache, hypertension, prostate cancer, leukemia. EXAM: CT HEAD WITHOUT CONTRAST CT MAXILLOFACIAL WITHOUT CONTRAST TECHNIQUE: Multidetector CT imaging of the head and maxillofacial structures were performed using the standard protocol without intravenous contrast. Multiplanar CT image reconstructions of the maxillofacial structures were also generated. COMPARISON:  MRI of the head September 02, 2017. FINDINGS: CT HEAD FINDINGS BRAIN: No intraparenchymal hemorrhage, mass effect nor midline shift. The ventricles and sulci are normal for age. Patchy supratentorial white matter hypodensities within normal range for patient's age, though non-specific are most compatible with chronic small vessel ischemic disease. No acute large vascular territory infarcts. No abnormal extra-axial fluid collections. Basal cisterns are patent. VASCULAR: Mild calcific atherosclerosis of the carotid siphons. SKULL: No skull fracture. No significant scalp soft tissue swelling. OTHER: None. CT MAXILLOFACIAL FINDINGS OSSEOUS: The mandible is intact, the condyles are located. Acute nondisplaced bilateral distal nasal bone fractures. No destructive bony lesions. Advanced cervical spondylosis. ORBITS: Status post bilateral ocular lens implants. Orbital contents are nonacute. SINUSES: Small bilateral maxillary sinus air-fluid levels, potential hemosinus. Scattered mucosal retention cysts. Nasal septum mildly deviated to the right with small bony spur. Tiny metallic foreign body right lateral neck. SOFT TISSUES: Nasal soft tissue swelling with subcutaneous gas, no radiopaque foreign bodies. Mild calcific atherosclerosis carotid bifurcations. IMPRESSION: CT HEAD: 1. Negative non-contrast CT  head for age. CT MAXILLOFACIAL: 1. Acute nondisplaced bilateral nasal bone fractures. Electronically Signed   By: Elon Alas M.D.   On: 04/14/2018 06:36    Procedures .Epistaxis Management Date/Time: 04/14/2018 8:06 AM Performed by: Ripley Fraise, MD Authorized by: Ripley Fraise, MD   Consent:    Consent obtained:  Verbal   Consent given by:  Patient   Risks discussed:  Pain Anesthesia (see MAR for exact dosages):    Anesthesia method:  None Procedure details:    Treatment site:  L anterior   Treatment complexity:  Extensive   Treatment episode: initial   Post-procedure details:    Assessment:  Bleeding decreased   Patient tolerance of procedure:  Tolerated well, no immediate complications .Marland KitchenLaceration Repair Date/Time: 04/14/2018 8:07 AM Performed by: Ripley Fraise, MD Authorized by: Ripley Fraise, MD   Consent:    Consent obtained:  Verbal   Alternatives discussed:  No treatment Laceration details:    Location:  Face   Face location:  Nose   Length (cm):  3 Repair type:    Repair type:  Complex Pre-procedure details:    Preparation:  Patient was prepped and draped in usual sterile fashion Exploration:    Wound exploration: entire depth of wound probed and visualized     Contaminated: no   Treatment:    Area cleansed with:  Shur-Clens   Amount of cleaning:  Standard Skin repair:    Repair method:  Sutures   Suture size:  6-0   Wound skin closure material used: vicryl.   Suture technique:  Simple interrupted   Number of sutures:  3 Approximation:    Approximation:  Close Post-procedure details:    Patient tolerance of procedure:  Tolerated well, no immediate complications    Medications Ordered in ED Medications  lidocaine (XYLOCAINE) 2 % injection (has no administration in time range)  cephALEXin (KEFLEX) capsule 500 mg (has no administration  in time range)  Tdap (BOOSTRIX) injection 0.5 mL (0.5 mLs Intramuscular Given 04/14/18 0620)      Initial Impression / Assessment and Plan / ED Course  I have reviewed the triage vital signs and the nursing notes.  Pertinent labs & imaging results that were available during my care of the patient were reviewed by me and considered in my medical decision making (see chart for details).     Presents after accidentally injuring his nose he is on xarelto.  He sustained a laceration to the nose with some cartilage exposure.  CT imaging confirmed bilateral nasal fractures.  I discussed the case with Dr. Constance Holster for ENT.  Plan will be to start antibiotics, repair wound and  he can follow-up in office today.  Patient agreed with this plan.  Wounds were repaired without difficulty.  However soon after, patient began having significant episode of epistaxis. I Was able to suction the left nare and placed packing. Patient with improvement of bleeding.  Plan at signout to Dr. Sabra Heck is to monitor for half hour, and if no further bleeding he can be discharged home.  Patient has been instructed to call Dr. Constance Holster for follow-up today  Final Clinical Impressions(s) / ED Diagnoses   Final diagnoses:  Closed fracture of nasal bone, initial encounter  Laceration of nose, initial encounter  Epistaxis    ED Discharge Orders        Ordered    cephALEXin (KEFLEX) 500 MG capsule  2 times daily     04/14/18 0802       Ripley Fraise, MD 04/14/18 [phone]

## 2018-04-15 ENCOUNTER — Encounter: Payer: Self-pay | Admitting: Cardiovascular Disease

## 2018-04-15 ENCOUNTER — Telehealth: Payer: Self-pay

## 2018-04-15 MED ORDER — ATENOLOL 25 MG PO TABS: 25.0000 mg | ORAL_TABLET | Freq: Every day | ORAL | 3 refills | Status: DC

## 2018-04-15 NOTE — Telephone Encounter (Signed)
Per Dr. [name], Call patient in atenolol 25 mg instead of metroprolol as it is less lipophilic and does not cross the blood brain barrier.  Called patient to inform him that atenolol would be sent to his pharmacy of choice. Patient verbalized understanding.

## 2018-04-23 ENCOUNTER — Ambulatory Visit: Payer: Self-pay | Admitting: Psychology

## 2018-05-05 ENCOUNTER — Ambulatory Visit (INDEPENDENT_AMBULATORY_CARE_PROVIDER_SITE_OTHER): Payer: Medicare Other | Admitting: Psychology

## 2018-05-05 DIAGNOSIS — F4321 Adjustment disorder with depressed mood: Secondary | ICD-10-CM | POA: Diagnosis not present

## 2018-05-11 ENCOUNTER — Other Ambulatory Visit: Payer: Self-pay | Admitting: Family Medicine

## 2018-05-11 DIAGNOSIS — F411 Generalized anxiety disorder: Secondary | ICD-10-CM

## 2018-05-21 ENCOUNTER — Ambulatory Visit: Payer: Self-pay | Admitting: Psychology

## 2018-05-29 ENCOUNTER — Encounter: Payer: Self-pay | Admitting: Cardiovascular Disease

## 2018-06-02 ENCOUNTER — Encounter: Payer: Self-pay | Admitting: Family Medicine

## 2018-06-02 ENCOUNTER — Encounter: Payer: Self-pay | Admitting: Cardiovascular Disease

## 2018-06-08 ENCOUNTER — Encounter: Payer: Self-pay | Admitting: Cardiovascular Disease

## 2018-06-08 ENCOUNTER — Encounter: Payer: Self-pay | Admitting: Family Medicine

## 2018-06-15 ENCOUNTER — Encounter: Payer: Self-pay | Admitting: Family Medicine

## 2018-06-15 ENCOUNTER — Ambulatory Visit: Payer: Medicare Other | Admitting: Family Medicine

## 2018-06-15 DIAGNOSIS — I10 Essential (primary) hypertension: Secondary | ICD-10-CM | POA: Diagnosis not present

## 2018-06-15 DIAGNOSIS — F411 Generalized anxiety disorder: Secondary | ICD-10-CM | POA: Diagnosis not present

## 2018-06-15 DIAGNOSIS — E785 Hyperlipidemia, unspecified: Secondary | ICD-10-CM

## 2018-06-15 NOTE — Patient Instructions (Addendum)
Health Maintenance Due  Topic Date Due  . Please check with your pharmacy to see if they have the shingrix vaccine. If they do- please get this immunization and update [name] by phone call or mychart with dates you receive the vaccine   . INFLUENZA VACCINE -Informed patient to schedule a Lab visit on or 06/25/2018. 06/11/2018   Continue current anxiety medicines- hopefully you will continue to feel better the longer you are off the atenolol  Blood pressure looks great today off medicine. I would like for you to buy/use a home cuff to check at least 3-4x a week maximum. Your goal is <140/90. If you note in the next few weeks that it is higher than our goal, see me sooner. Otherwise, see me in 3-6 months. Bring your home cuff and your log of blood pressures with you to visit.   Glad you are doing well with daily cholesterol medicine  You have a visit on 09/29/18 for 6 month follow up. You could schedule a physical around that time instead if you want

## 2018-06-15 NOTE — Progress Notes (Signed)
Subjective:  [name] is a 72 y.o. year old very pleasant male patient who presents for/with See problem oriented charting ROS- no chest pain or shortness of breath. No longer having vivid dreams off beta blocker.    Past Medical History-  Patient Active Problem List   Diagnosis Date Noted  . S/P Bioprosthetic AVR (aortic valve replacement) in 2011 03/22/2015    Priority: High  . Recurrent pulmonary embolism ([place]) 11/09/2014    Priority: High  . Chronic lymphocytic leukemia ([place]) 11/11/2013    Priority: High  . Atrial fibrillation ([place]) 04/20/2007    Priority: High  . PROSTATE CANCER, HX OF 04/20/2007    Priority: High  . Cervical spondylosis     Priority: Medium  . Migraine 10/08/2010    Priority: Medium  . Insomnia 02/03/2008    Priority: Medium  . Anxiety state 09/06/2007    Priority: Medium  . HLD (hyperlipidemia) 04/20/2007    Priority: Medium  . Essential hypertension 04/20/2007    Priority: Medium  . Former smoker 08/29/2016    Priority: Low  . Lung nodule 11/02/2014    Priority: Low  . GERD 12/11/2009    Priority: Low  . MITRAL REGURGITATION 04/07/2009    Priority: Low  . BPPV (benign paroxysmal positional vertigo) 09/04/2017  . CKD (chronic kidney disease), stage III ([place]) 08/11/2017    Medications- reviewed and updated Current Outpatient Medications  Medication Sig Dispense Refill  . cholecalciferol (VITAMIN D) 1000 units tablet Take 1,000 Units by mouth daily.    . diclofenac sodium (VOLTAREN) 1 % GEL Apply 2 g topically as needed.    Marland Kitchen FLUoxetine (PROZAC) 20 MG capsule TAKE 1 CAPSULE BY MOUTH EVERY DAY 90 capsule 2  . imipramine (TOFRANIL) 10 MG tablet Take 1 tablet (10 mg total) by mouth at bedtime. 90 tablet 1  . LORazepam (ATIVAN) 0.5 MG tablet TAKE 1 TABLET EVERY DAY AS NEEDED FOR ANXIETY 30 tablet 5  . meclizine (ANTIVERT) 25 MG tablet Take 1 tablet (25 mg total) by mouth 3 (three) times daily as needed for dizziness. 30 tablet 0  . rivaroxaban  (XARELTO) 20 MG TABS tablet Take 1 tablet (20 mg total) by mouth daily with supper. 90 tablet 3  . rosuvastatin (CRESTOR) 10 MG tablet Take 1 tablet (10 mg total) by mouth daily. 90 tablet 3   No current facility-administered medications for this visit.     Objective: BP 128/88 (BP Location: Left Arm, Patient Position: Sitting, Cuff Size: Normal)   Pulse 79   Temp (!) 97.5 F (36.4 C) (Oral)   Ht '5\' 10"'$  (1.778 m)   Wt 202 lb 9.6 oz (91.9 kg)   SpO2 98%   BMI 29.07 kg/m  Gen: NAD, resting comfortably CV: RRR no murmurs rubs or gallops Lungs: CTAB no crackles, wheeze, rhonchi Abdomen: soft/nontender/nondistended/normal bowel sounds.  Ext: no edema Skin: warm, dry Anxious mood compared to previous  Assessment/Plan:  Anxiety state S: patient remians on prozac '20mg'$ . GAD7 still slightly elevated at 10. He is also on imipramine for sleep and seeing Dr. Cheryln Manly monthly. We had discussed EMDR given miltary trauma in past/possible PTSD  Dizzy spells got better with prozac increase, sparing ativan, and tai chi so thought that this was likely psychiatric in nature.   Anxiety higher he thinks due to stopping propranolol. Energy level higher and fewer headaches. He thinks the 2 weeks have been worse.   Taking 0.'5mg'$  ativan everyday over last 2 weeks with adjustments.  A/P:  we will continue prozac and imipramine as well as CBT with DR. Gutterman. Anxiety up off beta blocker but not worth the risk given vivid dreams and nasal fracture associated with one of these vivid dreams. I am ok with him using ativan daily for now- possibly as gets further out from stopping betta blocker he can cut this use back down  Essential hypertension S: controlled off  atenolol '25mg'$  after he fell and fractured nose in July. Nightmares are now gone since being off betablockers- had issue on atenolol and metoprolol  Multiple family members with stroke so he wants to watch closely. Blood pressure has been ok at the  pharmacy as well.  BP Readings from Last 3 Encounters:  06/15/18 128/88  04/14/18 (!) 172/108  04/08/18 128/80  A/P: We discussed blood pressure goal of <140/90. Continue current meds. Would prefer diastolic lower but will watch closely for now. Perhaps use amlodipine 2.'5mg'$  in future  HLD (hyperlipidemia) S: poorly controlled on crestor '10mg'$  MWF in past-  has been able to tolerate without statin myalgias with this. We increased to daily and hopefully lipids are controlled- luckily he is tolerating this as well  A/P: continue current rx. Update lipids with next bloodwork likely   Future Appointments  Date Time Provider [place]  06/18/2018  1:00 PM Oren Binet, PhD [facility] None  07/16/2018  1:00 PM Oren Binet, PhD [facility] None  08/17/2018 11:30 AM [facility] None  08/17/2018 12:00 PM Ennever, Rudell Cobb, MD [facility] None  09/29/2018  1:15 PM [name], MD [facility]  03/23/2019  2:00 PM [facility] HEALTH COACH [facility]   Return precautions advised.  Garret Reddish, MD

## 2018-06-17 ENCOUNTER — Encounter: Payer: Self-pay | Admitting: Cardiovascular Disease

## 2018-06-17 NOTE — Assessment & Plan Note (Signed)
S: controlled off  atenolol 25mg  after he fell and fractured nose in July. Nightmares are now gone since being off betablockers- had issue on atenolol and metoprolol  Multiple family members with stroke so he wants to watch closely. Blood pressure has been ok at the pharmacy as well.  BP Readings from Last 3 Encounters:  06/15/18 128/88  04/14/18 (!) 172/108  04/08/18 128/80  A/P: We discussed blood pressure goal of <140/90. Continue current meds. Would prefer diastolic lower but will watch closely for now. Perhaps use amlodipine 2.5mg  in future

## 2018-06-17 NOTE — Assessment & Plan Note (Signed)
S: patient remians on prozac '20mg'$ . GAD7 still slightly elevated at 10. He is also on imipramine for sleep and seeing Dr. Cheryln Manly monthly. We had discussed EMDR given miltary trauma in past/possible PTSD  Dizzy spells got better with prozac increase, sparing ativan, and tai chi so thought that this was likely psychiatric in nature.   Anxiety higher he thinks due to stopping propranolol. Energy level higher and fewer headaches. He thinks the 2 weeks have been worse.   Taking 0.'5mg'$  ativan everyday over last 2 weeks with adjustments.  A/P: we will continue prozac and imipramine as well as CBT with DR. Gutterman. Anxiety up off beta blocker but not worth the risk given vivid dreams and nasal fracture associated with one of these vivid dreams. I am ok with him using ativan daily for now- possibly as gets further out from stopping betta blocker he can cut this use back down

## 2018-06-17 NOTE — Assessment & Plan Note (Signed)
S: poorly controlled on crestor 10mg  MWF in past-  has been able to tolerate without statin myalgias with this. We increased to daily and hopefully lipids are controlled- luckily he is tolerating this as well  A/P: continue current rx. Update lipids with next bloodwork likely

## 2018-06-18 ENCOUNTER — Ambulatory Visit (INDEPENDENT_AMBULATORY_CARE_PROVIDER_SITE_OTHER): Payer: Medicare Other | Admitting: Psychology

## 2018-06-18 DIAGNOSIS — F4321 Adjustment disorder with depressed mood: Secondary | ICD-10-CM

## 2018-06-19 ENCOUNTER — Encounter: Payer: Self-pay | Admitting: Family Medicine

## 2018-06-24 ENCOUNTER — Ambulatory Visit (HOSPITAL_COMMUNITY)
Admission: EM | Admit: 2018-06-24 | Discharge: 2018-06-24 | Disposition: A | Discharge status: Home / Self Care | Payer: Medicare Other | Attending: Family Medicine | Admitting: Family Medicine

## 2018-06-24 ENCOUNTER — Encounter (HOSPITAL_COMMUNITY): Payer: Self-pay | Admitting: Emergency Medicine

## 2018-06-24 ENCOUNTER — Other Ambulatory Visit: Payer: Self-pay

## 2018-06-24 DIAGNOSIS — R42 Dizziness and giddiness: Secondary | ICD-10-CM

## 2018-06-24 MED ORDER — MECLIZINE HCL 12.5 MG PO TABS: 12.5000 mg | ORAL_TABLET | Freq: Three times a day (TID) | ORAL | 0 refills | Status: DC | PRN

## 2018-06-24 NOTE — ED Notes (Signed)
Patient requested to lie down-room stops spinning when lying down

## 2018-06-24 NOTE — ED Provider Notes (Signed)
[place]   [id number] 06/24/18 Arrival Time: 9892   SUBJECTIVE:  [name] is a 72 y.o. male who presents to the urgent care with complaint of persistent lightheadedness that began this morning.  He is also had fullness in his ears despite trying to clean them out.  Patient has had vertigo in the past.  He is postop aortic valve replacement with a bovine valve.  He is been diagnosed with hypertension the past but after having a side effect from metoprolol, he has not been taking anything for his blood pressure and has been remaining normal.  Patient denies chest pain or shortness of breath he does have a problem with anxiety.  Patient has gone swimming and also had a massage to try to alleviate his symptoms.  Patient is a retired Dance movement psychotherapist.  He also does martial arts.     Past Medical History:  Diagnosis Date  . Anticoagulants causing adverse effect in therapeutic use   . Anxiety    Paxil seemed to cause odd neuro side effects; better on prozac as of 09/2015  . Aortic regurgitation 04/2007 surgery   Resolved with [facility] at [facility]  . Cervical spondylosis    ESI by [name] in Ortho. 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 Chronic lymphocytic leukemia (CLL), B-cell ([place]) approx 2012   stable on f/u's with [name] (most recent 07/2017)  . History of prostate cancer 2003   Rad prost  . History of pulmonary embolism 2011; 10/2014   Recurrence off of anticoagulation 10/2014: per hem/onc ([name]) pt needs lifelong anticoagulation (hypercoag w/u neg).  . Hyperlipidemia    statin-intolerant, except for crestor low dose.  Marland Kitchen Hypertension   . Migraine syndrome   . Mitral regurgitation   . PAF (paroxysmal atrial fibrillation) ([place])   . Panic attacks    "           "             "                  "                  "                       "                             "                         . Pulmonary nodule, right 2015   RLL: resected at [facility]  --VATS; Benign RLL nodule (fungal and AFB stains neg).  Plan is for [facility] thoracic surg to do f/u o/v & CT chest 1 yr   . Right-sided headache 07/2016   Primary stabbing HA or migraine per neuro (Dr. Tomi Likens).  Gabapentin and topamax not tolerated.  These resolved spontaneously.   Family History  Problem Relation Age of Onset  . Cancer Sister        uterine  . Colon cancer Sister 14  . Stroke Mother   . Prostate cancer Father   . Stroke Sister   . Stroke Brother   . Heart attack Neg Hx    Social History   Socioeconomic History  . Marital status: Single    Spouse name: Not on file  . Number  of children: Not on file  . Years of education: Not on file  . Highest education level: Not on file  Occupational History    Comment: Retired - Tobacco   Social Needs  . Financial resource strain: Not on file  . Food insecurity:    Worry: Not on file    Inability: Not on file  . Transportation needs:    Medical: Not on file    Non-medical: Not on file  Tobacco Use  . Smoking status: Former Smoker    Packs/day: 1.00    Years: 12.00    Pack years: 12.00    Types: Cigarettes    Start date: 11/28/1968    Last attempt to quit: 01/29/1981    Years since quitting: 37.4  . Smokeless tobacco: Never Used  . Tobacco comment: quit 72 yo   Substance and Sexual Activity  . Alcohol use: Yes    Alcohol/week: 0.0 standard drinks    Comment: occasional  . Drug use: No  . Sexual activity: Not on file  Lifestyle  . Physical activity:    Days per week: Not on file    Minutes per session: Not on file  . Stress: Not on file  Relationships  . Social connections:    Talks on phone: Not on file    Gets together: Not on file    Attends religious service: Not on file    Active member of club or organization: Not on file    Attends meetings of clubs or organizations: Not on file    Relationship status: Not on file  . Intimate partner violence:    Fear of current or ex partner: Not on file     Emotionally abused: Not on file    Physically abused: Not on file    Forced sexual activity: Not on file  Other Topics Concern  . Not on file  Social History Narrative   Single. No children. Lives alone      Retired from [employer].       Hobbies: travel, Scientist, product/process development, enjoys being outdoors including hiking   No outpatient medications have been marked as taking for the 06/24/18 encounter [facility] Encounter).   Allergies  Allergen Reactions  . Hydrocodone-Acetaminophen Shortness Of Breath  . Oxycodone Hcl Shortness Of Breath  . Telmisartan-Hctz Shortness Of Breath and Palpitations    Dizziness, too  . Ramipril   . Aspirin Other (See Comments) and Rash    Other Reaction: confusion Can't take high dose  . Atorvastatin Other (See Comments)    myalgia  . Buspirone Hcl Other (See Comments)    headache  . Codeine Nausea Only and Other (See Comments)    Other Reaction: confusion  . Codeine Phosphate Itching and Nausea Only  . Hydrocodone-Acetaminophen Nausea Only and Other (See Comments)    Other Reaction: confusion  . Morphine Sulfate Itching  . Paroxetine Other (See Comments)    Paresthesias: R arm, R leg, r side of face  . Rabeprazole Other (See Comments)    headache      ROS: As per HPI, remainder of ROS negative.   OBJECTIVE:   Vitals:   06/24/18 1757 06/24/18 1758  BP:  118/82  Pulse:  83  Resp:  18  Temp: 98.1 F (36.7 C) 98.1 F (36.7 C)  TempSrc:  Oral  SpO2:  98%     General appearance: alert; no distress Eyes: PERRL; EOMI; conjunctiva normal HENT: normocephalic; atraumatic; TMs normal, canal normal, external  ears normal without trauma; nasal mucosa normal; oral mucosa normal Neck: supple Lungs: clear to auscultation bilaterally Heart: regular rate and rhythm; 2/6 systolic aortic murmur with right parasternal surgical scar Back: no CVA tenderness Extremities: no cyanosis or edema; symmetrical with no gross  deformities Skin: warm and dry Neurologic: normal gait; grossly normal Psychological: alert and cooperative; normal mood and affect      Labs:  Results for orders placed or performed in visit on 03/20/18  Hepatitis C antibody  Result Value Ref Range   Hepatitis C Ab NON-REACTIVE NON-REACTI   SIGNAL TO CUT-OFF 0.01 <1.00    Labs Reviewed - No data to display  No results found.     ASSESSMENT & PLAN:  1. Vertigo     Meds ordered this encounter  Medications  . meclizine (ANTIVERT) 12.5 MG tablet    Sig: Take 1 tablet (12.5 mg total) by mouth 3 (three) times daily as needed for dizziness.    Dispense:  30 tablet    Refill:  0    Reviewed expectations re: course of current medical issues. Questions answered. Outlined signs and symptoms indicating need for more acute intervention. Patient verbalized understanding. After Visit Summary given.    Procedures:      Robyn Haber, MD 06/24/18 [id number]

## 2018-06-24 NOTE — ED Triage Notes (Addendum)
Reports having dizziness this morning.  Patient had been up for a short time when dizziness started.  Ears feel "full". Patient has used a saline solution.  Patient is also somewhat nauseated.    Stopped taking medicine-toprol- secondary to nightmares with provider knowledge.

## 2018-06-25 ENCOUNTER — Other Ambulatory Visit: Payer: Self-pay | Admitting: Family Medicine

## 2018-07-14 ENCOUNTER — Encounter: Payer: Self-pay | Admitting: Family Medicine

## 2018-07-14 ENCOUNTER — Other Ambulatory Visit: Payer: Self-pay

## 2018-07-14 ENCOUNTER — Ambulatory Visit: Payer: Medicare Other | Admitting: Family Medicine

## 2018-07-14 VITALS — BP 140/84 | HR 76 | Temp 98.0°F | Ht 70.0 in | Wt 207.0 lb

## 2018-07-14 DIAGNOSIS — I10 Essential (primary) hypertension: Secondary | ICD-10-CM | POA: Diagnosis not present

## 2018-07-14 MED ORDER — MECLIZINE HCL 12.5 MG PO TABS: 12.5000 mg | ORAL_TABLET | Freq: Three times a day (TID) | ORAL | 1 refills | Status: DC | PRN

## 2018-07-14 NOTE — Patient Instructions (Signed)
Lets recheck in 2-3 weeks on blood pressure. Hair high today and you had some high measurements at home- if we see a number over 140/90 at follow up could consider hydrochlorothiazide 12.5mg  - I really like amlodipine as well but <1% of people have abnormal dreams on it and with your history want to try to go with something less likely to cause dream issues.   DASH Eating Plan DASH stands for "Dietary Approaches to Stop Hypertension." The DASH eating plan is a healthy eating plan that has been shown to reduce high blood pressure (hypertension). It may also reduce your risk for type 2 diabetes, heart disease, and stroke. The DASH eating plan may also help with weight loss. What are tips for following this plan? General guidelines  Avoid eating more than 2,300 mg (milligrams) of salt (sodium) a day. If you have hypertension, you may need to reduce your sodium intake to 1,500 mg a day.  Limit alcohol intake to no more than 1 drink a day for nonpregnant women and 2 drinks a day for men. One drink equals 12 oz of beer, 5 oz of wine, or 1 oz of hard liquor.  Work with your health care provider to maintain a healthy body weight or to lose weight. Ask what an ideal weight is for you.  Get at least 30 minutes of exercise that causes your heart to beat faster (aerobic exercise) most days of the week. Activities may include walking, swimming, or biking.  Work with your health care provider or diet and nutrition specialist (dietitian) to adjust your eating plan to your individual calorie needs. Reading food labels  Check food labels for the amount of sodium per serving. Choose foods with less than 5 percent of the Daily Value of sodium. Generally, foods with less than 300 mg of sodium per serving fit into this eating plan.  To find whole grains, look for the word "whole" as the first word in the ingredient list. Shopping  Buy products labeled as "low-sodium" or "no salt added."  Buy fresh foods. Avoid  canned foods and premade or frozen meals. Cooking  Avoid adding salt when cooking. Use salt-free seasonings or herbs instead of table salt or sea salt. Check with your health care provider or pharmacist before using salt substitutes.  Do not fry foods. Cook foods using healthy methods such as baking, boiling, grilling, and broiling instead.  Cook with heart-healthy oils, such as olive, canola, soybean, or sunflower oil. Meal planning   Eat a balanced diet that includes: ? 5 or more servings of fruits and vegetables each day. At each meal, try to fill half of your plate with fruits and vegetables. ? Up to 6-8 servings of whole grains each day. ? Less than 6 oz of lean meat, poultry, or fish each day. A 3-oz serving of meat is about the same size as a deck of cards. One egg equals 1 oz. ? 2 servings of low-fat dairy each day. ? A serving of nuts, seeds, or beans 5 times each week. ? Heart-healthy fats. Healthy fats called Omega-3 fatty acids are found in foods such as flaxseeds and coldwater fish, like sardines, salmon, and mackerel.  Limit how much you eat of the following: ? Canned or prepackaged foods. ? Food that is high in trans fat, such as fried foods. ? Food that is high in saturated fat, such as fatty meat. ? Sweets, desserts, sugary drinks, and other foods with added sugar. ? Full-fat dairy products.  Do not salt foods before eating.  Try to eat at least 2 vegetarian meals each week.  Eat more home-cooked food and less restaurant, buffet, and fast food.  When eating at a restaurant, ask that your food be prepared with less salt or no salt, if possible. What foods are recommended? The items listed may not be a complete list. Talk with your dietitian about what dietary choices are best for you. Grains Whole-grain or whole-wheat bread. Whole-grain or whole-wheat pasta. Brown rice. [place]. Bulgur. Whole-grain and low-sodium cereals. Pita bread. Low-fat, low-sodium  crackers. Whole-wheat flour tortillas. Vegetables Fresh or frozen vegetables (raw, steamed, roasted, or grilled). Low-sodium or reduced-sodium tomato and vegetable juice. Low-sodium or reduced-sodium tomato sauce and tomato paste. Low-sodium or reduced-sodium canned vegetables. Fruits All fresh, dried, or frozen fruit. Canned fruit in natural juice (without added sugar). Meat and other protein foods Skinless chicken or [place]. Ground chicken or [place]. Pork with fat trimmed off. Fish and seafood. Egg whites. Dried beans, peas, or lentils. Unsalted nuts, nut butters, and seeds. Unsalted canned beans. Lean cuts of beef with fat trimmed off. Low-sodium, lean deli meat. Dairy Low-fat (1%) or fat-free (skim) milk. Fat-free, low-fat, or reduced-fat cheeses. Nonfat, low-sodium ricotta or cottage cheese. Low-fat or nonfat yogurt. Low-fat, low-sodium cheese. Fats and oils Soft margarine without trans fats. Vegetable oil. Low-fat, reduced-fat, or light mayonnaise and salad dressings (reduced-sodium). Canola, safflower, olive, soybean, and sunflower oils. Avocado. Seasoning and other foods Herbs. Spices. Seasoning mixes without salt. Unsalted popcorn and pretzels. Fat-free sweets. What foods are not recommended? The items listed may not be a complete list. Talk with your dietitian about what dietary choices are best for you. Grains Baked goods made with fat, such as croissants, muffins, or some breads. Dry pasta or rice meal packs. Vegetables Creamed or fried vegetables. Vegetables in a cheese sauce. Regular canned vegetables (not low-sodium or reduced-sodium). Regular canned tomato sauce and paste (not low-sodium or reduced-sodium). Regular tomato and vegetable juice (not low-sodium or reduced-sodium). Angie Fava. Olives. Fruits Canned fruit in a light or heavy syrup. Fried fruit. Fruit in cream or butter sauce. Meat and other protein foods Fatty cuts of meat. Ribs. Fried meat. Berniece Salines. Sausage. Bologna and  other processed lunch meats. Salami. Fatback. Hotdogs. Bratwurst. Salted nuts and seeds. Canned beans with added salt. Canned or smoked fish. Whole eggs or egg yolks. Chicken or [place] with skin. Dairy Whole or 2% milk, cream, and half-and-half. Whole or full-fat cream cheese. Whole-fat or sweetened yogurt. Full-fat cheese. Nondairy creamers. Whipped toppings. Processed cheese and cheese spreads. Fats and oils Butter. Stick margarine. Lard. Shortening. Ghee. Bacon fat. Tropical oils, such as coconut, palm kernel, or palm oil. Seasoning and other foods Salted popcorn and pretzels. Onion salt, garlic salt, seasoned salt, table salt, and sea salt. Worcestershire sauce. Tartar sauce. Barbecue sauce. Teriyaki sauce. Soy sauce, including reduced-sodium. Steak sauce. Canned and packaged gravies. Fish sauce. Oyster sauce. Cocktail sauce. Horseradish that you find on the shelf. Ketchup. Mustard. Meat flavorings and tenderizers. Bouillon cubes. Hot sauce and Tabasco sauce. Premade or packaged marinades. Premade or packaged taco seasonings. Relishes. Regular salad dressings. Where to find more information:  National Heart, Lung, and [place]: https://wilson-eaton.com/  American Heart Association: www.heart.org Summary  The DASH eating plan is a healthy eating plan that has been shown to reduce high blood pressure (hypertension). It may also reduce your risk for type 2 diabetes, heart disease, and stroke.  With the DASH eating plan, you should limit salt (sodium)  intake to 2,300 mg a day. If you have hypertension, you may need to reduce your sodium intake to 1,500 mg a day.  When on the DASH eating plan, aim to eat more fresh fruits and vegetables, whole grains, lean proteins, low-fat dairy, and heart-healthy fats.  Work with your health care provider or diet and nutrition specialist (dietitian) to adjust your eating plan to your individual calorie needs. This information is not intended to replace advice  given to you by your health care provider. Make sure you discuss any questions you have with your health care provider. Document Released: 10/17/2011 Document Revised: 10/21/2016 Document Reviewed: 10/21/2016 Elsevier Interactive Patient Education  [employer].

## 2018-07-14 NOTE — Progress Notes (Signed)
Subjective:  [name] is a 72 y.o. year old very pleasant male patient who presents for/with See problem oriented charting ROS- No chest pain or shortness of breath. No headache today (had some a few days ago) or blurry vision.    Past Medical History-  Patient Active Problem List   Diagnosis Date Noted  . S/P Bioprosthetic AVR (aortic valve replacement) in 2011 03/22/2015    Priority: High  . Recurrent pulmonary embolism ([place]) 11/09/2014    Priority: High  . Chronic lymphocytic leukemia ([place]) 11/11/2013    Priority: High  . Atrial fibrillation ([place]) 04/20/2007    Priority: High  . PROSTATE CANCER, HX OF 04/20/2007    Priority: High  . Cervical spondylosis     Priority: Medium  . Migraine 10/08/2010    Priority: Medium  . Insomnia 02/03/2008    Priority: Medium  . Anxiety state 09/06/2007    Priority: Medium  . HLD (hyperlipidemia) 04/20/2007    Priority: Medium  . Essential hypertension 04/20/2007    Priority: Medium  . Former smoker 08/29/2016    Priority: Low  . Lung nodule 11/02/2014    Priority: Low  . GERD 12/11/2009    Priority: Low  . MITRAL REGURGITATION 04/07/2009    Priority: Low  . BPPV (benign paroxysmal positional vertigo) 09/04/2017  . CKD (chronic kidney disease), stage III ([place]) 08/11/2017    Medications- reviewed and updated Current Outpatient Medications  Medication Sig Dispense Refill  . cholecalciferol (VITAMIN D) 1000 units tablet Take 1,000 Units by mouth daily.    Marland Kitchen FLUoxetine (PROZAC) 20 MG capsule TAKE 1 CAPSULE BY MOUTH EVERY DAY 90 capsule 2  . imipramine (TOFRANIL) 10 MG tablet TAKE 1 TABLET BY MOUTH EVERYDAY AT BEDTIME 90 tablet 1  . LORazepam (ATIVAN) 0.5 MG tablet TAKE 1 TABLET EVERY DAY AS NEEDED FOR ANXIETY 30 tablet 5  . meclizine (ANTIVERT) 12.5 MG tablet Take 1 tablet (12.5 mg total) by mouth 3 (three) times daily as needed for dizziness. 30 tablet 0  . rivaroxaban (XARELTO) 20 MG TABS tablet Take 1 tablet (20 mg total) by  mouth daily with supper. 90 tablet 3  . rosuvastatin (CRESTOR) 10 MG tablet Take 1 tablet (10 mg total) by mouth daily. 90 tablet 3   No current facility-administered medications for this visit.     Objective: BP 140/84 (BP Location: Left Arm, Patient Position: Sitting, Cuff Size: Normal)   Pulse 76   Temp 98 F (36.7 C) (Oral)   Ht '5\' 10"'$  (1.778 m)   Wt 207 lb (93.9 kg)   SpO2 96%   BMI 29.70 kg/m  Gen: NAD, resting comfortably CV: RRR  SEM murmur noted Lungs: CTAB no crackles, wheeze, rhonchi Ext: no edema Skin: warm, dry Neuro: speech normal, moves all extremities  Assessment/Plan:  Essential hypertension S: controlled poorly at home. Woke up with headaches several days in a row. Checked blood pressure and was getting 150s/90s. Made appointment and then stopped checking and headaches have gone away.   Walking 10 miles a day, doing his tai chi or, swimming. Low salt diet already. Does look like weight is up 5 lbs from last visit. No edema.   BP Readings from Last 3 Encounters:  07/14/18 140/84  06/24/18 118/82  06/15/18 128/88  A/P: prior on beta blocker now off due to vivid dreams. BP seems to be trending up. We discussed hctz 12.'5mg'$  as option vs. Amlodipine (some abnormal dreams on amlodipine though). Ultimately we opted to continue  exercise, try DASH diet, and recheck 2-3 weeks. Consider medicine if BP remains up at that time  Future Appointments  Date Time Provider [place]  07/16/2018  1:00 PM Oren Binet, PhD [facility] None  08/13/2018  1:00 PM Oren Binet, PhD [facility] None  08/17/2018 11:30 AM [facility] None  08/17/2018 12:00 PM Ennever, Rudell Cobb, MD [facility] None  09/29/2018  1:15 PM [name], MD [facility]  03/23/2019  2:00 PM [facility] HEALTH COACH [facility]   Time Stamp The duration of face-to-face time during this visit was greater than 15 minutes. Greater than 50% of this time was spent in counseling, explanation  of diagnosis, planning of further management, and/or coordination of care including BP goals and options for controlling BP .   Return precautions advised.  Garret Reddish, MD

## 2018-07-14 NOTE — Assessment & Plan Note (Signed)
S: controlled poorly at home. Woke up with headaches several days in a row. Checked blood pressure and was getting 150s/90s. Made appointment and then stopped checking and headaches have gone away.   Walking 10 miles a day, doing his tai chi or, swimming. Low salt diet already. Does look like weight is up 5 lbs from last visit. No edema.   BP Readings from Last 3 Encounters:  07/14/18 140/84  06/24/18 118/82  06/15/18 128/88  A/P: prior on beta blocker now off due to vivid dreams. BP seems to be trending up. We discussed hctz 12.'5mg'$  as option vs. Amlodipine (some abnormal dreams on amlodipine though). Ultimately we opted to continue exercise, try DASH diet, and recheck 2-3 weeks. Consider medicine if BP remains up at that time

## 2018-07-16 ENCOUNTER — Ambulatory Visit (INDEPENDENT_AMBULATORY_CARE_PROVIDER_SITE_OTHER): Payer: Medicare Other | Admitting: Psychology

## 2018-07-16 DIAGNOSIS — F4321 Adjustment disorder with depressed mood: Secondary | ICD-10-CM

## 2018-07-20 ENCOUNTER — Encounter: Payer: Self-pay | Admitting: Family Medicine

## 2018-07-21 ENCOUNTER — Encounter: Payer: Self-pay | Admitting: Family Medicine

## 2018-07-28 ENCOUNTER — Ambulatory Visit: Payer: Medicare Other | Admitting: Family Medicine

## 2018-07-28 ENCOUNTER — Encounter: Payer: Self-pay | Admitting: Family Medicine

## 2018-07-28 VITALS — BP 136/86 | HR 81 | Temp 97.8°F | Ht 70.0 in | Wt 210.0 lb

## 2018-07-28 DIAGNOSIS — I10 Essential (primary) hypertension: Secondary | ICD-10-CM

## 2018-07-28 MED ORDER — HYDROCHLOROTHIAZIDE 12.5 MG PO TABS: 12.5000 mg | ORAL_TABLET | Freq: Every day | ORAL | 5 refills | Status: DC

## 2018-07-28 NOTE — Assessment & Plan Note (Signed)
S: poorly controlled on most home readings in low 140 over low 90s. Today, reasonable control in office but he states does have some #s in this range at home just that more than half are above our goal 140/90. Gets headaches when BP up above goal  He is liking his dreams again- no overly exciting dreams anymore.  BP Readings from Last 3 Encounters:  07/28/18 136/86  07/14/18 140/84  06/24/18 118/82  A/P: We discussed blood pressure goal of <140/90 at home and office- he would like to get more aggressive with medication but is concerned about side effects. Concerned about possible dreams on amlodipine. Offered hctz 12.5mg  and when he looks at side effects gets discouraged- we opted to still move forward and trial this and he will update me by mychart with BPs in 2-3 weeks

## 2018-07-28 NOTE — Progress Notes (Signed)
Subjective:  Tibor Lemmons Fettes is a 72 y.o. year old very pleasant male patient who presents for/with See problem oriented charting ROS- No chest pain or shortness of breath. some headaches. Occasional vertigo or dizziness. no blurry vision.    Past Medical History-  Patient Active Problem List   Diagnosis Date Noted  . S/P Bioprosthetic AVR (aortic valve replacement) in 2011 03/22/2015    Priority: High  . Recurrent pulmonary embolism ([place]) 11/09/2014    Priority: High  . Chronic lymphocytic leukemia ([place]) 11/11/2013    Priority: High  . Atrial fibrillation ([place]) 04/20/2007    Priority: High  . PROSTATE CANCER, HX OF 04/20/2007    Priority: High  . Cervical spondylosis     Priority: Medium  . Migraine 10/08/2010    Priority: Medium  . Insomnia 02/03/2008    Priority: Medium  . Anxiety state 09/06/2007    Priority: Medium  . HLD (hyperlipidemia) 04/20/2007    Priority: Medium  . Essential hypertension 04/20/2007    Priority: Medium  . Former smoker 08/29/2016    Priority: Low  . Lung nodule 11/02/2014    Priority: Low  . GERD 12/11/2009    Priority: Low  . MITRAL REGURGITATION 04/07/2009    Priority: Low  . BPPV (benign paroxysmal positional vertigo) 09/04/2017  . CKD (chronic kidney disease), stage III ([place]) 08/11/2017    Medications- reviewed and updated Current Outpatient Medications  Medication Sig Dispense Refill  . cholecalciferol (VITAMIN D) 1000 units tablet Take 1,000 Units by mouth daily.    Marland Kitchen LORazepam (ATIVAN) 0.5 MG tablet TAKE 1 TABLET EVERY DAY AS NEEDED FOR ANXIETY 30 tablet 5  . meclizine (ANTIVERT) 12.5 MG tablet Take 1 tablet (12.5 mg total) by mouth 3 (three) times daily as needed for dizziness. 30 tablet 1  . rivaroxaban (XARELTO) 20 MG TABS tablet Take 1 tablet (20 mg total) by mouth daily with supper. 90 tablet 3  . rosuvastatin (CRESTOR) 10 MG tablet Take 1 tablet (10 mg total) by mouth daily. 90 tablet 3  . sertraline (ZOLOFT) 100 MG tablet Take  100 mg by mouth daily.    . hydrochlorothiazide (HYDRODIURIL) 12.5 MG tablet Take 1 tablet (12.5 mg total) by mouth daily. 30 tablet 5   No current facility-administered medications for this visit.     Objective: BP 136/86 (BP Location: Left Arm, Patient Position: Sitting, Cuff Size: Normal)   Pulse 81   Temp 97.8 F (36.6 C) (Oral)   Ht 5\' 10"  (1.778 m)   Wt 210 lb (95.3 kg)   SpO2 98%   BMI 30.13 kg/m  Gen: NAD, resting comfortably CV: RRR no murmurs rubs or gallops Lungs: CTAB no crackles, wheeze, rhonchi Abdomen: soft/nontender/nondistended Ext: no edema Skin: warm, dry  Assessment/Plan:  Essential hypertension S: poorly controlled on most home readings in low 140 over low 90s. Today, reasonable control in office but he states does have some #s in this range at home just that more than half are above our goal 140/90. Gets headaches when BP up above goal  He is liking his dreams again- no overly exciting dreams anymore.  BP Readings from Last 3 Encounters:  07/28/18 136/86  07/14/18 140/84  06/24/18 118/82  A/P: We discussed blood pressure goal of <140/90 at home and office- he would like to get more aggressive with medication but is concerned about side effects. Concerned about possible dreams on amlodipine. Offered hctz 12.5mg  and when he looks at side effects gets discouraged- we opted  to still move forward and trial this and he will update me by mychart with BPs in 2-3 weeks   Future Appointments  Date Time Provider [place]  08/13/2018  1:00 PM Oren Binet, PhD [facility] None  08/17/2018 11:30 AM [facility] None  08/17/2018 12:00 PM Ennever, Rudell Cobb, MD [facility] None  09/29/2018  1:15 PM [name], MD [facility]  03/23/2019  2:00 PM [facility]ALTH CO[facility]    Meds ordered this encounter  Medications  . hydrochlorothiazide (HYDRODIURIL) 12.5 MG tablet    Sig: Take 1 tablet (12.5 mg total) by mouth daily.    Dispense:   30 tablet    Refill:  5    Return precautions advised.  Garret Reddish, MD

## 2018-07-28 NOTE — Patient Instructions (Signed)
Trial hydrochlorothiazide 12.5mg  daily in the morning. Generally dont advise at night as may make you pee slightly more and keep you up. Stay well hydrated.   How about send me a message with your #s in 2-3 weeks with how they are doing- if they look good and no side effects can continue  If any intolerable side effects let me know and we can try amlodipine 2.5mg  as next step.   My goal is to get most of your numbers <140/90.

## 2018-08-03 ENCOUNTER — Other Ambulatory Visit: Payer: Self-pay | Admitting: Family Medicine

## 2018-08-13 ENCOUNTER — Ambulatory Visit (INDEPENDENT_AMBULATORY_CARE_PROVIDER_SITE_OTHER): Payer: Medicare Other | Admitting: Psychology

## 2018-08-13 DIAGNOSIS — F4321 Adjustment disorder with depressed mood: Secondary | ICD-10-CM

## 2018-08-17 ENCOUNTER — Inpatient Hospital Stay: Payer: Medicare Other

## 2018-08-17 ENCOUNTER — Inpatient Hospital Stay: Payer: Medicare Other | Attending: Hematology & Oncology | Admitting: Hematology & Oncology

## 2018-08-17 VITALS — BP 119/76 | HR 73 | Temp 98.0°F | Resp 18 | Wt 205.5 lb

## 2018-08-17 DIAGNOSIS — C9110 Chronic lymphocytic leukemia of B-cell type not having achieved remission: Secondary | ICD-10-CM

## 2018-08-17 DIAGNOSIS — R911 Solitary pulmonary nodule: Secondary | ICD-10-CM | POA: Diagnosis not present

## 2018-08-17 DIAGNOSIS — Z7901 Long term (current) use of anticoagulants: Secondary | ICD-10-CM | POA: Insufficient documentation

## 2018-08-17 DIAGNOSIS — I2699 Other pulmonary embolism without acute cor pulmonale: Secondary | ICD-10-CM | POA: Diagnosis not present

## 2018-08-17 LAB — CBC WITH DIFFERENTIAL (CANCER CENTER ONLY)
Basophils Absolute: 0 10*3/uL (ref 0.0–0.1)
Basophils Relative: 0 %
Eosinophils Absolute: 0.1 10*3/uL (ref 0.0–0.5)
Eosinophils Relative: 3 %
HCT: 44.1 % (ref 38.7–49.9)
Hemoglobin: 14.6 g/dL (ref 13.0–17.1)
Lymphocytes Relative: 42 %
Lymphs Abs: 1.5 10*3/uL (ref 0.9–3.3)
MCH: 31.7 pg (ref 28.0–33.4)
MCHC: 33.1 g/dL (ref 32.0–35.9)
MCV: 95.9 fL (ref 82.0–98.0)
Monocytes Absolute: 0.5 10*3/uL (ref 0.1–0.9)
Monocytes Relative: 14 %
Neutro Abs: 1.5 10*3/uL (ref 1.5–6.5)
Neutrophils Relative %: 41 %
Platelet Count: 152 10*3/uL (ref 145–400)
RBC: 4.6 MIL/uL (ref 4.20–5.70)
RDW: 12.3 % (ref 11.1–15.7)
WBC Count: 3.6 10*3/uL — ABNORMAL LOW (ref 4.0–10.0)

## 2018-08-17 LAB — CMP (CANCER CENTER ONLY)
ALT: 26 U/L (ref 0–44)
AST: 24 U/L (ref 15–41)
Albumin: 4.2 g/dL (ref 3.5–5.0)
Alkaline Phosphatase: 60 U/L (ref 38–126)
Anion gap: 10 (ref 5–15)
BUN: 19 mg/dL (ref 8–23)
CO2: 29 mmol/L (ref 22–32)
Calcium: 9.7 mg/dL (ref 8.9–10.3)
Chloride: 104 mmol/L (ref 98–111)
Creatinine: 1.39 mg/dL — ABNORMAL HIGH (ref 0.61–1.24)
GFR, Est AFR Am: 57 mL/min — ABNORMAL LOW
GFR, Estimated: 49 mL/min — ABNORMAL LOW
Glucose, Bld: 81 mg/dL (ref 70–99)
Potassium: 4.6 mmol/L (ref 3.5–5.1)
Sodium: 143 mmol/L (ref 135–145)
Total Bilirubin: 0.4 mg/dL (ref 0.3–1.2)
Total Protein: 7.1 g/dL (ref 6.5–8.1)

## 2018-08-17 LAB — LACTATE DEHYDROGENASE: LDH: 283 U/L — ABNORMAL HIGH (ref 98–192)

## 2018-08-17 NOTE — Progress Notes (Signed)
Hematology and Oncology Follow Up Visit  [name] [facility] [id number] 1946-08-21 72 y.o. 08/17/2018   Principle Diagnosis:   Right lower lobe pulmonary nodule- non-malignant  Recurrent pulmonary embolism  Progressive  Current Therapy:    Xarelto 20 mg by mouth daily       Rituxan/bendamustine-s/p cycle #2 - given 06/18/2017      Interim History:  Mr.  [name] is back for follow-up.  He is doing fairly well.  He had a decent summer.  He is staying in good shape.  He has sustained shape because in March he has had a down to [place] for a couple weeks.  He will be going to [place], [place], and [place].  I know this will be a wonderful trip for him.  Apparently, he is dealing with PTSD.  He is [facility].  I know this is unusual but I am glad that he is able to get some counseling for this.  He is had no fever.  He has had no nausea or vomiting.  He has had no swollen lymph nodes.  He has had no change in bowel or bladder habits.  He has had no rashes.  He is on Xarelto.  He has had no bleeding.  Overall, his performance status is ECOG 0.  Medications:  Current Outpatient Medications:  .  cholecalciferol (VITAMIN D) 1000 units tablet, Take 1,000 Units by mouth daily., Disp: , Rfl:  .  hydrochlorothiazide (HYDRODIURIL) 12.5 MG tablet, Take 1 tablet (12.5 mg total) by mouth daily., Disp: 30 tablet, Rfl: 5 .  LORazepam (ATIVAN) 0.5 MG tablet, TAKE 1 TABLET EVERY DAY AS NEEDED FOR ANXIETY, Disp: 30 tablet, Rfl: 5 .  meclizine (ANTIVERT) 12.5 MG tablet, TAKE 1 TABLET BY MOUTH 3 TIMES DAILY AS NEEDED FOR DIZZINESS., Disp: 30 tablet, Rfl: 3 .  rivaroxaban (XARELTO) 20 MG TABS tablet, Take 1 tablet (20 mg total) by mouth daily with supper., Disp: 90 tablet, Rfl: 3 .  rosuvastatin (CRESTOR) 10 MG tablet, Take 1 tablet (10 mg total) by mouth daily., Disp: 90 tablet, Rfl: 3 .  sertraline (ZOLOFT) 100 MG tablet, Take 100 mg by mouth daily., Disp: ,  Rfl:   Allergies:  Allergies  Allergen Reactions  . Hydrocodone-Acetaminophen Shortness Of Breath  . Oxycodone Hcl Shortness Of Breath  . Telmisartan-Hctz Shortness Of Breath and Palpitations    Dizziness, too  . Ramipril   . Aspirin Other (See Comments) and Rash    Other Reaction: confusion Can't take high dose  . Atorvastatin Other (See Comments)    myalgia  . Buspirone Hcl Other (See Comments)    headache  . Codeine Nausea Only and Other (See Comments)    Other Reaction: confusion  . Codeine Phosphate Itching and Nausea Only  . Hydrocodone-Acetaminophen Nausea Only and Other (See Comments)    Other Reaction: confusion  . Morphine Sulfate Itching  . Paroxetine Other (See Comments)    Paresthesias: R arm, R leg, r side of face  . Rabeprazole Other (See Comments)    headache    Past Medical History, Surgical history, Social history, and Family History were reviewed and updated.  Review of Systems: Review of Systems  Constitutional: Negative.   HENT: Negative.   Eyes: Negative.   Respiratory: Negative.   Cardiovascular: Negative.   Gastrointestinal: Negative.   Genitourinary: Negative.   Musculoskeletal: Negative.   Skin: Negative.   Neurological: Negative.   Endo/Heme/Allergies: Negative.   Psychiatric/Behavioral:  Negative.     Physical Exam:  weight is 205 lb 8 oz (93.2 kg). His oral temperature is 98 F (36.7 C). His blood pressure is 119/76 and his pulse is 73. His respiration is 18 and oxygen saturation is 98%.   Physical Exam  Constitutional: He is oriented to person, place, and time.  HENT:  Head: Normocephalic and atraumatic.  Mouth/Throat: Oropharynx is clear and moist.  Eyes: Pupils are equal, round, and reactive to light. EOM are normal.  Neck: Normal range of motion.  Cardiovascular: Normal rate, regular rhythm and normal heart sounds.  Pulmonary/Chest: Effort normal and breath sounds normal.  Abdominal: Soft. Bowel sounds are normal.    Musculoskeletal: Normal range of motion. He exhibits no edema, tenderness or deformity.  Lymphadenopathy:    He has no cervical adenopathy.  Neurological: He is alert and oriented to person, place, and time.  Skin: Skin is warm and dry. No rash noted. No erythema.  Psychiatric: He has a normal mood and affect. His behavior is normal. Judgment and thought content normal.  Vitals reviewed.   Lab Results  Component Value Date   WBC 3.6 (L) 08/17/2018   HGB 14.6 08/17/2018   HCT 44.1 08/17/2018   MCV 95.9 08/17/2018   PLT 152 08/17/2018     Chemistry      Component Value Date/Time   NA 144 02/16/2018 1334   NA 145 07/22/2017 0756   NA 139 06/27/2016 1410   K 4.1 02/16/2018 1334   K 4.3 07/22/2017 0756   K 5.1 06/27/2016 1410   CL 104 02/16/2018 1334   CL 105 07/22/2017 0756   CO2 31 02/16/2018 1334   CO2 30 07/22/2017 0756   CO2 28 06/27/2016 1410   BUN 15 02/16/2018 1334   BUN 17 07/22/2017 0756   BUN 20.9 06/27/2016 1410   CREATININE 1.60 (H) 02/16/2018 1334   CREATININE 1.6 (H) 07/22/2017 0756   CREATININE 1.6 (H) 06/27/2016 1410      Component Value Date/Time   CALCIUM 9.1 02/16/2018 1334   CALCIUM 9.6 07/22/2017 0756   CALCIUM 9.6 06/27/2016 1410   ALKPHOS 58 02/16/2018 1334   ALKPHOS 59 07/22/2017 0756   ALKPHOS 62 06/27/2016 1410   AST 25 02/16/2018 1334   AST 18 06/27/2016 1410   ALT 25 02/16/2018 1334   ALT 24 07/22/2017 0756   ALT 24 06/27/2016 1410   BILITOT 0.5 02/16/2018 1334   BILITOT 0.46 06/27/2016 1410         Impression and Plan: Mr. Seeberger is 72 year-old African-American gentleman. He has recurrent pulmonary embolism. I think he needs lifelong anticoagulation.He is doing very well on Xarelto. He enjoys being on Xarelto.  I want to get him back before he goes on his trip to [place].  I want to get scans on him a month before he goes.  As such, we will see him back in February.       Marland KitchenVolanda Napoleon, MD 10/7/20191:19 PM

## 2018-08-18 ENCOUNTER — Encounter: Payer: Self-pay | Admitting: Family Medicine

## 2018-08-18 LAB — BETA 2 MICROGLOBULIN, SERUM: Beta-2 Microglobulin: 2 mg/L (ref 0.6–2.4)

## 2018-08-19 ENCOUNTER — Encounter: Payer: Self-pay | Admitting: Family Medicine

## 2018-08-19 ENCOUNTER — Ambulatory Visit: Payer: Self-pay | Admitting: *Deleted

## 2018-08-19 NOTE — Telephone Encounter (Signed)
See note

## 2018-08-19 NOTE — Telephone Encounter (Signed)
Pt called with whelps on both legs at the top where his compression hose end and no other part of his legs. . He denies itching, fever or pain. Has some redness there. Started using hydrocortisone cream to see if the whelps go away. The whelps are circular. The left one seems to be worst than the right one.  Home care advised. Will call back if whelps increase, along with itching.  Reason for Disposition . Localized hives  Answer Assessment - Initial Assessment Questions 1. APPEARANCE: "What does the rash look like?"      whelps 2. LOCATION: "Where is the rash located?"      Right below the knees on both legs 3. NUMBER: "How many hives are there?"      circular 4. SIZE: "How big are the hives?" (inches, cm, compare to coins) "Do they all look the same or is there lots of variation in shape and size?"      About an inch 5. ONSET: "When did the hives begin?" (Hours or days ago)      This morning 6. ITCHING: "Does it itch?" If so, ask: "How bad is the itch?"    - MILD: doesn't interfere with normal activities   - MODERATE - SEVERE: interferes with work, school, sleep, or other activities      no 7. RECURRENT PROBLEM: "Have you had hives before?" If so, ask: "When was the last time?" and "What happened that time?"      no 8. TRIGGERS: "Were you exposed to any new food, plant, cosmetic product or animal just before the hives began?"     Started wearing compression hose 9. OTHER SYMPTOMS: "Do you have any other symptoms?" (e.g., fever, tongue swelling, difficulty breathing, abdominal pain)     no  Protocols used: HIVES-A-AH

## 2018-08-19 NOTE — Telephone Encounter (Signed)
Noted. I had responded to patient's My Chart message and asked him to call the office and schedule an appointment if needed

## 2018-08-20 ENCOUNTER — Other Ambulatory Visit: Payer: Self-pay | Admitting: Family Medicine

## 2018-08-24 ENCOUNTER — Encounter: Payer: Self-pay | Admitting: Hematology & Oncology

## 2018-09-10 ENCOUNTER — Ambulatory Visit (INDEPENDENT_AMBULATORY_CARE_PROVIDER_SITE_OTHER): Payer: Medicare Other | Admitting: Psychology

## 2018-09-10 DIAGNOSIS — F4321 Adjustment disorder with depressed mood: Secondary | ICD-10-CM | POA: Diagnosis not present

## 2018-09-29 ENCOUNTER — Other Ambulatory Visit: Payer: Self-pay | Admitting: Family Medicine

## 2018-09-29 ENCOUNTER — Encounter: Payer: Self-pay | Admitting: Family Medicine

## 2018-09-29 ENCOUNTER — Ambulatory Visit: Payer: Medicare Other | Admitting: Family Medicine

## 2018-09-29 VITALS — BP 108/78 | HR 82 | Temp 98.4°F | Ht 70.0 in | Wt 209.6 lb

## 2018-09-29 DIAGNOSIS — I480 Paroxysmal atrial fibrillation: Secondary | ICD-10-CM

## 2018-09-29 DIAGNOSIS — E785 Hyperlipidemia, unspecified: Secondary | ICD-10-CM | POA: Diagnosis not present

## 2018-09-29 DIAGNOSIS — F4310 Post-traumatic stress disorder, unspecified: Secondary | ICD-10-CM

## 2018-09-29 DIAGNOSIS — N183 Chronic kidney disease, stage 3 unspecified: Secondary | ICD-10-CM

## 2018-09-29 DIAGNOSIS — Z8546 Personal history of malignant neoplasm of prostate: Secondary | ICD-10-CM | POA: Diagnosis not present

## 2018-09-29 DIAGNOSIS — I10 Essential (primary) hypertension: Secondary | ICD-10-CM

## 2018-09-29 DIAGNOSIS — M79674 Pain in right toe(s): Secondary | ICD-10-CM

## 2018-09-29 LAB — CBC
HCT: 41.5 % (ref 39.0–52.0)
Hemoglobin: 14.1 g/dL (ref 13.0–17.0)
MCHC: 34 g/dL (ref 30.0–36.0)
MCV: 94 fl (ref 78.0–100.0)
Platelets: 131 10*3/uL — ABNORMAL LOW (ref 150.0–400.0)
RBC: 4.41 Mil/uL (ref 4.22–5.81)
RDW: 13 % (ref 11.5–15.5)
WBC: 4.1 10*3/uL (ref 4.0–10.5)

## 2018-09-29 LAB — COMPREHENSIVE METABOLIC PANEL
ALT: 27 U/L (ref 0–53)
AST: 25 U/L (ref 0–37)
Albumin: 4.1 g/dL (ref 3.5–5.2)
Alkaline Phosphatase: 51 U/L (ref 39–117)
BUN: 20 mg/dL (ref 6–23)
CO2: 27 mEq/L (ref 19–32)
Calcium: 8.9 mg/dL (ref 8.4–10.5)
Chloride: 107 mEq/L (ref 96–112)
Creatinine, Ser: 1.28 mg/dL (ref 0.40–1.50)
GFR: 70.92 mL/min (ref >60.00)
Glucose, Bld: 111 mg/dL — ABNORMAL HIGH (ref 70–99)
Potassium: 3.8 mEq/L (ref 3.5–5.1)
Sodium: 142 mEq/L (ref 135–145)
Total Bilirubin: 0.4 mg/dL (ref 0.2–1.2)
Total Protein: 6.1 g/dL (ref 6.0–8.3)

## 2018-09-29 LAB — LIPID PANEL
Cholesterol: 184 mg/dL (ref 0–200)
HDL: 41.6 mg/dL (ref >39.00)
NonHDL: 142.07
Total CHOL/HDL Ratio: 4
Triglycerides: 226 mg/dL — ABNORMAL HIGH (ref 0.0–149.0)
VLDL: 45.2 mg/dL — ABNORMAL HIGH (ref 0.0–40.0)

## 2018-09-29 LAB — PSA: PSA: 0 ng/mL — ABNORMAL LOW (ref 0.10–4.00)

## 2018-09-29 LAB — LDL CHOLESTEROL, DIRECT: Direct LDL: 105 mg/dL

## 2018-09-29 MED ORDER — MECLIZINE HCL 12.5 MG PO TABS: ORAL_TABLET | ORAL | 3 refills | Status: DC

## 2018-09-29 NOTE — Patient Instructions (Addendum)
Please stop by lab before you go  No changes anticipated today  Please schedule a visit with our sports medicine physician Dr. Paulla Fore before you leave at the check out desk so he can further evaluate your great toe pain . You can also ask his opinion on the left knee issues- sounds like nerve irritation to me possibly from how you are sleeping

## 2018-09-29 NOTE — Assessment & Plan Note (Signed)
S: controlled on hydrochlorothiazide 12.5 mg. Feels well with this medicine- minimal SE BP Readings from Last 3 Encounters:  09/29/18 108/78  08/17/18 119/76  07/28/18 136/86  A/P: We discussed blood pressure goal of <140/90. Continue current meds

## 2018-09-29 NOTE — Assessment & Plan Note (Signed)
S: [facility]- trying to determine which type of therapy would be most helpful. He is on sertraline through them- doing ok- helping with anxiety A/P: stable- continue zoloft and [facility]

## 2018-09-29 NOTE — Assessment & Plan Note (Signed)
S: Filtration rate has been stable in the 50-60 range.  Blood pressure is well controlled.  Lipids mild poorly controlled. A/P:  Hopefully stalbe- continue risk factor modification

## 2018-09-29 NOTE — Assessment & Plan Note (Signed)
S: hopefully improved controlled on crestor 10mg  daily Lab Results  Component Value Date   CHOL 222 (H) 08/11/2017   HDL 44.40 08/11/2017   LDLCALC 146 (H) 08/11/2017   LDLDIRECT 169.0 03/30/2013   TRIG 162.0 (H) 08/11/2017   CHOLHDL 5 08/11/2017   A/P: update lipid panel

## 2018-09-29 NOTE — Progress Notes (Signed)
Subjective:  [name] is a 72 y.o. year old very pleasant male patient who presents for/with See problem oriented charting ROS- No chest pain or shortness of breath. No headache or blurry vision.    Past Medical History-  Patient Active Problem List   Diagnosis Date Noted  . S/P Bioprosthetic AVR (aortic valve replacement) in 2011 03/22/2015    Priority: High  . Recurrent pulmonary embolism ([place]) 11/09/2014    Priority: High  . Chronic lymphocytic leukemia ([place]) 11/11/2013    Priority: High  . Atrial fibrillation ([place]) 04/20/2007    Priority: High  . PROSTATE CANCER, HX OF 04/20/2007    Priority: High  . CKD (chronic kidney disease), stage III ([place]) 08/11/2017    Priority: Medium  . Cervical spondylosis     Priority: Medium  . Migraine 10/08/2010    Priority: Medium  . Insomnia 02/03/2008    Priority: Medium  . Anxiety state 09/06/2007    Priority: Medium  . HLD (hyperlipidemia) 04/20/2007    Priority: Medium  . Essential hypertension 04/20/2007    Priority: Medium  . Former smoker 08/29/2016    Priority: Low  . Lung nodule 11/02/2014    Priority: Low  . GERD 12/11/2009    Priority: Low  . MITRAL REGURGITATION 04/07/2009    Priority: Low  . PTSD (post-traumatic stress disorder) 09/29/2018  . BPPV (benign paroxysmal positional vertigo) 09/04/2017    Medications- reviewed and updated Current Outpatient Medications  Medication Sig Dispense Refill  . cholecalciferol (VITAMIN D) 1000 units tablet Take 1,000 Units by mouth daily.    . hydrochlorothiazide (HYDRODIURIL) 12.5 MG tablet Take 1 tablet (12.5 mg total) by mouth daily. 30 tablet 5  . LORazepam (ATIVAN) 0.5 MG tablet TAKE 1 TABLET EVERY DAY AS NEEDED FOR ANXIETY 30 tablet 5  . meclizine (ANTIVERT) 12.5 MG tablet TAKE 1 TABLET BY MOUTH 3 TIMES DAILY AS NEEDED FOR DIZZINESS. 30 tablet 3  . rivaroxaban (XARELTO) 20 MG TABS tablet Take 1 tablet (20 mg total) by mouth daily with supper. 90 tablet 3  .  rosuvastatin (CRESTOR) 10 MG tablet Take 1 tablet (10 mg total) by mouth daily. 90 tablet 3  . sertraline (ZOLOFT) 100 MG tablet Take 100 mg by mouth daily.     No current facility-administered medications for this visit.     Objective: BP 108/78 (BP Location: Left Arm, Patient Position: Sitting, Cuff Size: Large)   Pulse 82   Temp 98.4 F (36.9 C) (Oral)   Ht 5\' 10"  (1.778 m)   Wt 209 lb 9.6 oz (95.1 kg)   SpO2 95%   BMI 30.07 kg/m  Gen: NAD, resting comfortably CV: RRR no murmurs rubs or gallops Lungs: CTAB no crackles, wheeze, rhonchi Abdomen: soft/nontender/nondistended/normal bowel sounds. Ext: no edema Skin: warm, dry MSK-some pain over proximal phalange  of right great toe.  Normal knee exam knee.  Assessment/Plan:  Other notes: 1. pain in right great toe intermittently. Does have some pain along proximal phalange with deep palpation- discussed could get x-ray or do referral- has seen Home ortho in past but has been sometime- we can get him in tomorrow with Dr. Paulla Fore and he opts for that.  2. Feels pain and coldness in left knee sometimes when he wakes up at night. [place] ortho has seen him in past. With normal knee exam-I told him this could be some nerve irritation from how he is laying at night.  He states he is never actually felt his knee  to see if it is cold.  He wants to ask Dr. Paulla Fore about this tomorrow in addition to toe issue. 3. PSAs have been at 0- back to 2016. Prostatectomy 2008- will update PSA.    Essential hypertension S: controlled on hydrochlorothiazide 12.5 mg. Feels well with this medicine- minimal SE BP Readings from Last 3 Encounters:  09/29/18 108/78  08/17/18 119/76  07/28/18 136/86  A/P: We discussed blood pressure goal of <140/90. Continue current meds  Atrial fibrillation (HCC) Recurrent pulmonary embolism S: Patient remains on Xarelto for anticoagulation.  He has had no recurrence of atrial fibrillation since aortic valve replacement in 2011.   He is not on rate control medication due to side effects on beta-blockers.  He is also on Xarelto due to history of recurrent pulmonary embolism as well as DVT. A/P: stable- continue xarelto given a fib history as well as PE history  HLD (hyperlipidemia) S: hopefully improved controlled on crestor 10mg  daily Lab Results  Component Value Date   CHOL 222 (H) 08/11/2017   HDL 44.40 08/11/2017   LDLCALC 146 (H) 08/11/2017   LDLDIRECT 169.0 03/30/2013   TRIG 162.0 (H) 08/11/2017   CHOLHDL 5 08/11/2017   A/P: update lipid panel  CKD (chronic kidney disease), stage III (HCC) S: Filtration rate has been stable in the 50-60 range.  Blood pressure is well controlled.  Lipids mild poorly controlled. A/P:  Hopefully stalbe- continue risk factor modification  PTSD (post-traumatic stress disorder) S: [facility] at [facility]- trying to determine which type of therapy would be most helpful. He is on sertraline through them- doing ok- helping with anxiety A/P: stable- continue zoloft and [facility] therapy  Future Appointments  Date Time Provider [place]  10/26/2018 10:45 AM Josue Hector, MD [facility]  11/05/2018  1:00 PM Oren Binet, PhD [facility] None  12/14/2018 10:30 AM [facility] None  12/14/2018 11:00 AM [facility]  12/14/2018 11:30 AM [facility]  12/14/2018 12:30 PM Ennever, Rudell Cobb, MD [facility] None  03/23/2019  2:00 PM [facility] HEALTH COACH [facility]   Lab/Order associations: Not fasting  We did not schedule planned follow-up-6 months would be reasonable  PROSTATE CANCER, HX OF - Plan: PSA  Essential hypertension - Plan: CBC, Comprehensive metabolic panel, Lipid panel  Paroxysmal atrial fibrillation (HCC)  Hyperlipidemia, unspecified hyperlipidemia type - Plan: CBC, Comprehensive metabolic panel, Lipid panel  CKD (chronic kidney disease), stage III (HCC)  PTSD (post-traumatic stress  disorder)  Great toe pain, right - Plan: Ambulatory referral to Sports Medicine  Meds ordered this encounter  Medications  . meclizine (ANTIVERT) 12.5 MG tablet    Sig: TAKE 1 TABLET BY MOUTH 3 TIMES DAILY AS NEEDED FOR DIZZINESS.    Dispense:  30 tablet    Refill:  3   Return precautions advised.  Garret Reddish, MD

## 2018-09-29 NOTE — Assessment & Plan Note (Signed)
Recurrent pulmonary embolism S: Patient remains on Xarelto for anticoagulation.  He has had no recurrence of atrial fibrillation since aortic valve replacement in 2011.  He is not on rate control medication due to side effects on beta-blockers.  He is also on Xarelto due to history of recurrent pulmonary embolism as well as DVT. A/P: stable- continue xarelto given a fib history as well as PE history

## 2018-09-30 ENCOUNTER — Ambulatory Visit: Payer: Medicare Other | Admitting: Sports Medicine

## 2018-09-30 ENCOUNTER — Encounter: Payer: Self-pay | Admitting: Sports Medicine

## 2018-09-30 VITALS — BP 140/82 | HR 67 | Ht 70.0 in | Wt 210.6 lb

## 2018-09-30 DIAGNOSIS — M216X1 Other acquired deformities of right foot: Secondary | ICD-10-CM

## 2018-09-30 DIAGNOSIS — M7741 Metatarsalgia, right foot: Secondary | ICD-10-CM

## 2018-09-30 NOTE — Progress Notes (Signed)
[name]. [name], [place] [place] - 72 y.o. male MRN [id number]  Date of birth: February 08, 1946  Visit Date: 09/30/2018  PCP: [name], MD   Referred by: [name], MD   Scribe(s) for today's visit: Wendy Poet, LAT, ATC  SUBJECTIVE:  [name] is here for New Patient (Initial Visit) (R great toe pain)  Referred by: Dr. Garret Reddish  HPI: His R great toe symptoms INITIALLY: Began 3-4 weeks w/ no known MOI Described as moderate sharp pain, radiating to the entire R great toe Worsened with walking Improved with Tylenol Additional associated symptoms include: no mechanical symptpoms, no redness or swelling in R great toe    At this time symptoms show no change compared to onset  He has been taking Tylenol.  REVIEW OF SYSTEMS: Denies night time disturbances. Denies fevers, chills, or night sweats. Denies unexplained weight loss. Reports personal history of cancer.  Prostate CA - 2008 and currently suffers from CLL  Denies changes in bowel or bladder habits. Denies recent unreported falls. Denies new or worsening dyspnea or wheezing. Reports headaches or dizziness. Has vertigo but takes medication for that. Reports numbness, tingling or weakness  In the extremities - numbness in L knee and feels cold upon waking Reports dizziness or presyncopal episodes Denies lower extremity edema    HISTORY:  Prior history reviewed and updated per electronic medical record.  Social History   Occupational History    Comment: Retired - Tobacco   Tobacco Use  . Smoking status: Former Smoker    Packs/day: 1.00    Years: 12.00    Pack years: 12.00    Types: Cigarettes    Start date: 11/28/1968    Last attempt to quit: 01/29/1981    Years since quitting: 37.7  . Smokeless tobacco: Never Used  . Tobacco comment: quit 72 yo   Substance and Sexual Activity  . Alcohol use: Yes   Alcohol/week: 0.0 standard drinks    Comment: occasional  . Drug use: No  . Sexual activity: Not on file   Social History   Social History Narrative   Single. No children. Lives alone      Retired from [employer].       Hobbies: travel, Scientist, product/process development, enjoys being outdoors including hiking    Past Medical History:  Diagnosis Date  . Anticoagulants causing adverse effect in therapeutic use   . Anxiety    Paxil seemed to cause odd neuro side effects; better on prozac as of 09/2015  . Aortic regurgitation 04/2007 surgery   Resolved with tissue AVR at [facility]  . Cervical spondylosis    ESI by [name] in Ortho. 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 Chronic lymphocytic leukemia (CLL), B-cell ([place]) approx 2012   stable on f/u's with [name] (most recent 07/2017)  . History of prostate cancer 2003   Rad prost  . History of pulmonary embolism 2011; 10/2014   Recurrence off of anticoagulation 10/2014: per hem/onc ([name]) pt needs lifelong anticoagulation (hypercoag w/u neg).  . Hyperlipidemia    statin-intolerant, except for crestor low dose.  Marland Kitchen Hypertension   . Migraine syndrome   . Mitral regurgitation   . PAF (paroxysmal atrial fibrillation) ([place])   . Panic attacks    "           "             "                  "                  "                       "                             "                         .  Pulmonary nodule, right 2015   RLL: resected at [facility] --VATS; Benign RLL nodule (fungal and AFB stains neg).  Plan is for [facility] thoracic surg to do f/u o/v & CT chest 1 yr   . Right-sided headache 07/2016   Primary stabbing HA or migraine per neuro (Dr. Tomi Likens).  Gabapentin and topamax not tolerated.  These resolved spontaneously.   Past Surgical History:  Procedure Laterality Date  . AORTIC VALVE REPLACEMENT  2011   [facility]  (bioprosthetic)  . CARDIAC CATHETERIZATION  04/2010   Normal coronaries.  . Carotid dopplers  08/2015   NORMAL  . CATARACT  EXTRACTION     L 12/06/09   R 09/14/09  . COLONOSCOPY  01/26/13   tic's, o/w normal.  ([facility])--recall 10 yrs.  Marland Kitchen PENILE PROSTHESIS IMPLANT    . PROSTATECTOMY  04/2002   for prostate cancer  . Pulmonary nodule resection  Fall/winter 2016   [facility]  . SHOULDER SURGERY     rotator cuff on both arms   . TRANSTHORACIC ECHOCARDIOGRAM  09/20/2014; 03/2016; 03/2017   2015-mild LVH, EF 50-55%, wall motion nl, grade I diast dysfxn, prosth aort valve good,transaortic gradients decreased compared to echo 11/2013.   03/2016: EF 55-60%, normal LV function, severe LVH, normal diast fxn, mild increase in AV gradient compared to 09/2014 echo.  2018: EF 55-60%, normal LV fxn, grd II DD, AV stable/gradient's stable.   family history includes Cancer in his sister; Colon cancer (age of onset: 10) in his sister; Prostate cancer in his father; Stroke in his brother, mother, and sister. There is no history of Heart attack.  DATA OBTAINED & REVIEWED:  No results for input(s): HGBA1C, LABURIC, CREATINE in the last 8760 hours. No problems updated. .   OBJECTIVE:  VS:  HT:_0  (177.8 cm)   WT:210 lb 9.6 oz (95.5 kg)  BMI:30.22    BP:140/82  HR:67bpm  TEMP: ( )  RESP:96 %   PHYSICAL EXAM: CONSTITUTIONAL: Well-developed, Well-nourished and In no acute distress PSYCHIATRIC: Alert & appropriately interactive. and Not depressed or anxious appearing. RESPIRATORY: No increased work of breathing and Trachea Midline EYES: Pupils are equal., EOM intact without nystagmus. and No scleral icterus.  VASCULAR EXAM: Warm and well perfused NEURO: unremarkable  MSK Exam: Right Foot & Ankle Exam: No significant rashes/lesions/ulcerations overlying the examined area No overlying erythema/ecchymosis.  General Alignment: Normal alignment & Contours Long Arch: Moderate, well maintained with weight bearing Hind Foot Alignment: Normal Posterior Tibialis Recruitment: normal Transverse Arch: normal Splay Toe present: Yes  Hammer  Toes present: No  Bunion present: early Bunionette present: Yes  Morton's Callus present: No  Palpation:   Navicular: nontender Base of the 5th metatarsal: nontender Posterior aspect of the MEDIAL Malleolus: nontender Posterior aspect of the LATERAL Malleolus: nontender  ROM: Normal, no significant limitations Strength: No significant weakness with Inversion, eversion, dorsiflexion and plantar flexion Ankle Stablity: stable to testing and No significant pain with midfoot abduction    ASSESSMENT   1. Loss of transverse plantar arch of right foot   2. Metatarsalgia of right foot     PLAN:  Pertinent additional documentation may be included in corresponding procedure notes, imaging studies, problem based documentation and patient instructions.  Procedures:  . None  Medications:  No orders of the defined types were placed in this encounter.  Discussion/Instructions: No problem-specific Assessment & Plan notes found for this encounter.  . Long discussion today based on foot anatomy and transverse arch breakdown that she is experiencing.  We discussed multiple over-the-counter options versus custom cushioned insoles and he will plan to schedule follow-up for custom orthotics for his upcoming trip to [place]. . Discussed red flag symptoms that warrant earlier emergent evaluation and patient voices understanding. . Activity modifications and the importance of avoiding exacerbating activities (limiting pain to no more than a 4 / 10 during or following activity) recommended and discussed. . >50% of this 30 minutes minute visit spent in direct patient counseling and/or coordination of care. Discussion was focused on education regarding the in discussing the pathoetiology and anticipated clinical course of the above condition.  Follow-up:  . No follow-ups on file.      CMA/ATC served as Education administrator during this visit. History, Physical, and Plan performed by medical  provider. Documentation and orders reviewed and attested to.      [name], [facility] Physician

## 2018-09-30 NOTE — Patient Instructions (Addendum)
The cost of the pair of custom orthotics is $195.  You can look into having your insurance company cover the cost of these. Some insurance companies cover the cost and other do not.  If they do not you will be responsible for the full cost of the orthotics.  I am happy to do these for you at any time, you just need to let our front office schedulers know you would like an "orthotic appointment."  Please also make sure you bring athletic shoes with you on the day of your orthotic appointment or whatever shoes you plan to wear your orthotics in most frequently.   When you call your insurance company you will need to provide them the CPT code which is L3030 and there are 2 units.  You can call them  and ask if this is covered.

## 2018-10-02 ENCOUNTER — Encounter: Payer: Self-pay | Admitting: Family Medicine

## 2018-10-02 NOTE — Telephone Encounter (Signed)
Copied from [place] [phone]. Topic: Quick Sport and exercise psychologist Patient (Clinic Use ONLY) >> Oct 02, 2018 10:21 AM Yvette Rack wrote: Reason for CRM: pt states that someone called him  I see that someone called him about labs pt states that he saw results in mychart

## 2018-10-04 ENCOUNTER — Encounter: Payer: Self-pay | Admitting: Sports Medicine

## 2018-10-07 ENCOUNTER — Encounter: Payer: Self-pay | Admitting: Sports Medicine

## 2018-10-07 ENCOUNTER — Ambulatory Visit: Payer: Medicare Other | Admitting: Sports Medicine

## 2018-10-14 ENCOUNTER — Encounter: Payer: Self-pay | Admitting: Physical Therapy

## 2018-10-14 ENCOUNTER — Encounter: Payer: Self-pay | Admitting: Sports Medicine

## 2018-10-14 ENCOUNTER — Ambulatory Visit: Payer: Medicare Other | Admitting: Sports Medicine

## 2018-10-14 DIAGNOSIS — M216X1 Other acquired deformities of right foot: Secondary | ICD-10-CM | POA: Diagnosis not present

## 2018-10-14 DIAGNOSIS — M7741 Metatarsalgia, right foot: Secondary | ICD-10-CM

## 2018-10-14 NOTE — Patient Instructions (Signed)
Look up bunny ear lacing

## 2018-10-14 NOTE — Progress Notes (Signed)
  [name]. [name], [name] at [name] - 72 y.o. male MRN [id number]  Date of birth: 1946-10-12  Visit Date: 10/14/2018  PCP: [name], MD   Referred by: [name], MD  SUBJECTIVE:  [name] is here for a procedure only visit for custom orthotic fabrication.  OBJECTIVE:  PHYSICAL EXAM: Please see previous exam notes for full evaluation of foot and gait exam. MSK Exam: Slight overpronation of bilateral feet.  This does correct with orthotics.  He has a moderately high cavus foot with midfoot tenderness that is mild.  ASSESSMENT   1. Loss of transverse plantar arch of right foot   2. Metatarsalgia of right foot      PLAN & PROCEDURES:   . Custom orthotics fabricated today as below     PROCEDURE: CUSTOM ORTHOTIC FABRICATION Patient's underlying musculoskeletal conditions are directly related to poor biomechanics and will benefit from a functional custom orthotic.  There are no significant foot deformities that complicate the use of a custom orthotic.  The patient was fitted for a standard, cushioned, semi-rigid orthotic. The orthotic was heated & placed on the orthotic stand. The patient was positioned in subtalar neutral position and 10 of ankle dorsiflexion and weight bearing stance on the heated orthotic blank. After completion of the molding a base was applied to the orthotic blank. The orthotic was ground to a stable position for weightbearing. The patient ambulated in these and reported they were comfortable without pressure spots.              BLANK:  Size 11 - Standard Cushioned               BASE:  Blue EVA      POSTINGS:  none          [name], [place] Physician

## 2018-10-15 ENCOUNTER — Ambulatory Visit: Payer: Medicare Other | Admitting: Sports Medicine

## 2018-10-23 NOTE — Progress Notes (Signed)
Patient ID: [name], male   DOB: 1945/11/27, 71 y.o.   MRN: [id number]    Cardiology Office Note   Date:  10/26/2018   ID:  [name], DOB 1946-11-03, MRN [id number]  PCP:  [name] Olp, MD  Cardiologist:  [name]     No chief complaint on file.    History of Present Illness:  72 y.o. post bioprothetic AVR [name] July 2011 for severe AR. No CAD at cath prior to surgery. History of PAF, recurrent DVT and PE last 10/2014 on Xarelto for anticoagulation. Persistent LUE basilic / cephalic thrombus. Also history of HTN, HLD, CLL post chemoRx, prostate CA post radical prostatectomy and RLL lung nodule.   04/03/15  Had throascopic RLL wedge resection [name] Turned out to be benign granulomatous nodule And not cancer  CT 02/16/18 with new clustered nodules in right middle lobe ? Inflammation or infection and nonspecific stable LUL nodule.  CT neck 02/16/18 no recurrent leukemia.   Followed for observation at this point by [name] [name]   Has some anxiety issues that sometime make him appear dyspnic Wanted to try different beta blocker last visit to see if sleep issues And nightmares improved Changed from lopressor to Atenolol then stopped Only taking diuretic for BP  [facility]   Studies/Reports Reviewed Today:  TTE:  04/08/18 Study Conclusions mean gradient 21 mmHg peak 31 mmHg peak velocity 2.77 m/sec   - Left ventricle: The cavity size was normal. There was mild   concentric hypertrophy. Systolic function was normal. The   estimated ejection fraction was in the range of 60% to 65%. Left   ventricular diastolic function parameters were normal. - Aortic valve: Bio prosthetic stented AVR with stable gradients   since 2018 no peri valvular regurgitation DVI in normal range .32 - Mitral valve: There was mild regurgitation. - Left atrium: The atrium was moderately dilated. - Atrial septum: No defect or patent foramen ovale was  identified.   Disscussed issues of lipid control for deteriorating tissue valves   Switching primary to [name] . 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 Anticoagulants causing adverse effect in therapeutic use   . Anxiety    Paxil seemed to cause odd neuro side effects; better on prozac as of 09/2015  . Aortic regurgitation 04/2007 surgery   Resolved with [facility]R at [facility]  . Cervical spondylosis    ESI by [name] in Ortho. 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 Chronic lymphocytic leukemia (CLL), B-cell ([name]) approx 2012   stable on f/u's with [name]. Marin Bell (most recent 07/2017)  . History of prostate cancer 2003   Rad prost  . History of pulmonary embolism 2011; 10/2014   Recurrence off of anticoagulation 10/2014: per hem/onc ([name]. Marin Bell) pt needs lifelong anticoagulation (hypercoag w/u neg).  . Hyperlipidemia    statin-intolerant, except for crestor low dose.  Marland Kitchen Hypertension   . Migraine syndrome   . Mitral regurgitation   . PAF (paroxysmal atrial fibrillation) ([place])   . Panic attacks    "           "             "                  "                  "                       "                             "                         .  Pulmonary nodule, right 2015   RLL: resected at [facility] --VATS; Benign RLL nodule (fungal and AFB stains neg).  Plan is for [facility] thoracic surg to do f/u o/v & CT chest 1 yr   . Right-sided headache 07/2016   Primary stabbing HA or migraine per neuro ([name]).  Gabapentin and topamax not tolerated.  These resolved spontaneously.    Past Surgical History:  Procedure Laterality Date  . AORTIC VALVE REPLACEMENT  2011   [facility]  (bioprosthetic)  . CARDIAC CATHETERIZATION  04/2010   Normal coronaries.  . Carotid dopplers  08/2015   NORMAL  . CATARACT EXTRACTION     L  12/06/09   R 09/14/09  . COLONOSCOPY  01/26/13   tic's, o/w normal.  ([facility])--recall 10 yrs.  Marland Kitchen PENILE PROSTHESIS IMPLANT    . PROSTATECTOMY  04/2002   for prostate cancer  . Pulmonary nodule resection  Fall/winter 2016   [facility]  . SHOULDER SURGERY     rotator cuff on both arms   . TRANSTHORACIC ECHOCARDIOGRAM  09/20/2014; 03/2016; 03/2017   2015-mild LVH, EF 50-55%, wall motion nl, grade I diast dysfxn, prosth aort valve good,transaortic gradients decreased compared to echo 11/2013.   03/2016: EF 55-60%, normal LV function, severe LVH, normal diast fxn, mild increase in AV gradient compared to 09/2014 echo.  2018: EF 55-60%, normal LV fxn, grd II DD, AV stable/gradient's stable.     Current Outpatient Medications  Medication Sig Dispense Refill  . cholecalciferol (VITAMIN D) 1000 units tablet Take 1,000 Units by mouth daily.    . hydrochlorothiazide (HYDRODIURIL) 12.5 MG tablet Take 1 tablet (12.5 mg total) by mouth daily. 30 tablet 5  . LORazepam (ATIVAN) 0.5 MG tablet TAKE 1 TABLET EVERY DAY AS NEEDED FOR ANXIETY 30 tablet 5  . meclizine (ANTIVERT) 12.5 MG tablet TAKE 1 TABLET BY MOUTH 3 TIMES DAILY AS NEEDED FOR DIZZINESS. 30 tablet 3  . rivaroxaban (XARELTO) 20 MG TABS tablet Take 1 tablet (20 mg total) by mouth daily with supper. 90 tablet 3  . rosuvastatin (CRESTOR) 10 MG tablet Take 1 tablet (10 mg total) by mouth daily. 90 tablet 3  . sertraline (ZOLOFT) 100 MG tablet Take 100 mg by mouth daily.     No current facility-administered medications for this visit.     Allergies:   Hydrocodone-acetaminophen; Oxycodone hcl; Telmisartan-hctz; Ramipril; Aspirin; Atorvastatin; Buspirone hcl; Codeine; Codeine phosphate; Hydrocodone-acetaminophen; Morphine sulfate; Paroxetine; and Rabeprazole    Social History:  The patient  reports that he quit smoking about 37 years ago. His smoking use included cigarettes. He started smoking about 49 years ago. He has a 12.00 pack-year smoking history. He has  never used smokeless tobacco. He reports current alcohol use. He reports that he does not use drugs.   Family History:  The patient's family history includes Cancer in his sister; Colon cancer (age of onset: 62) in his sister; Prostate cancer in his father; Stroke in his brother, mother, and sister.    ROS:   Please see the history of present illness.   Review of Systems  All other systems reviewed and are negative.     PHYSICAL EXAM: VS:  BP (!) 144/88   Pulse 73   Ht 5\' 10"  (1.778 m)   Wt 210 lb (95.3 kg)   SpO2 95%   BMI 30.13 kg/m     Wt Readings from Last 3 Encounters:  10/26/18 210 lb (95.3 kg)  09/30/18 210 lb 9.6 oz (95.5 kg)  09/29/18  209 lb 9.6 oz (95.1 kg)    Affect appropriate Healthy:  appears stated age 48: normal Neck supple with no adenopathy JVP normal  Left bruits no thyromegaly Lungs clear with no wheezing and good diaphragmatic motion Heart:  S1/S2 SEM  murmur, no rub, gallop or click PMI normal Abdomen: benighn, BS positve, no tenderness, no AAA no bruit.  No HSM or HJR Distal pulses intact with no bruits No edema Neuro non-focal Skin warm and dry No muscular weakness    EKG:   03/22/15  NSR, HR 67, LAFB, RBBB, no change from prior tracing  04/02/16 SR rate 62 LAD RBBB LVH  06/18/17 SR rate 78 LAD RBBB LVH    Recent Labs: 12/30/2017: TSH 1.63 09/29/2018: ALT 27; BUN 20; Creatinine, Ser 1.28; Hemoglobin 14.1; Platelets 131.0; Potassium 3.8; Sodium 142    Lipid Panel    Component Value Date/Time   CHOL 184 09/29/2018 1342   TRIG 226.0 (H) 09/29/2018 1342   HDL 41.60 09/29/2018 1342   CHOLHDL 4 09/29/2018 1342   VLDL 45.2 (H) 09/29/2018 1342   LDLCALC 146 (H) 08/11/2017 0843   LDLDIRECT 105.0 09/29/2018 1342      ASSESSMENT AND PLAN:  Aortic insufficiency S/P Bioprosthetic AVR (aortic valve replacement) in 2011 Increased gradients on echo 03/19/17 mean 21 mmHg peak 36 mmHg continue anticoagulation.   DVI abnormal at .23  TTE 04/08/18  improved DVI .32 with stable mean gradient 21 mmHg peak 31 mmHg  History of DVT (deep vein thrombosis) and Recurrent pulmonary embolism Continue Xarelto. This is managed by heme/onc.   Paroxysmal atrial fibrillation Maintaining NSR.  Continue Atenolol and Xarelto.  Essential hypertension Borderline control.  Continue to monitor.  HLD (hyperlipidemia) Has had leg pain with statins in past. Taking crestor 3 x/week   Lung nodule  [facility]  Recent CT see above f/u [facility]   Carotid Bruit:  Left duplex no stenosis f/u 08/2019   Depression:  Better off paroxetine now on low dose prozac with tofranil  Headache:  F/u primary PRN tylenol ok has schrap metal on right side of head Cant have MRI  ION:GEXB with primary typically Cr around 1.6 on 02/16/18    BBB:  RBBB LAD post AVR ECG q 6 months follow for more advanced AV block   CLL:  F/u with [name] Jonette Eva post chemo observation recent CT scans including neck ok   Insomnia:  Improved off beta blocker and on zoloft     F/U in 6 months   [name]

## 2018-10-26 ENCOUNTER — Encounter: Payer: Self-pay | Admitting: Cardiovascular Disease

## 2018-10-26 ENCOUNTER — Ambulatory Visit: Payer: Medicare Other | Admitting: Cardiovascular Disease

## 2018-10-26 VITALS — BP 144/88 | HR 73 | Ht 70.0 in | Wt 210.0 lb

## 2018-10-26 DIAGNOSIS — I480 Paroxysmal atrial fibrillation: Secondary | ICD-10-CM

## 2018-10-26 DIAGNOSIS — I351 Nonrheumatic aortic (valve) insufficiency: Secondary | ICD-10-CM

## 2018-10-26 DIAGNOSIS — I10 Essential (primary) hypertension: Secondary | ICD-10-CM

## 2018-10-26 DIAGNOSIS — E785 Hyperlipidemia, unspecified: Secondary | ICD-10-CM

## 2018-10-26 NOTE — Patient Instructions (Signed)
Medication Instructions:   If you need a refill on your cardiac medications before your next appointment, please call your pharmacy.   Lab work:   If you have labs (blood work) drawn today and your tests are completely normal, you will receive your results only by: . MyChart Message (if you have MyChart) OR . A paper copy in the mail If you have any lab test that is abnormal or we need to change your treatment, we will call you to review the results.  Testing/Procedures: NONE ordered today.  Follow-Up: At [facility], you and your health needs are our priority.  As part of our continuing mission to provide you with exceptional heart care, we have created designated Provider Care Teams.  These Care Teams include your primary Cardiologist (physician) and Advanced Practice Providers (APPs -  Physician Assistants and Nurse Practitioners) who all work together to provide you with the care you need, when you need it. You will need a follow up appointment in 6 months.  Please call our office 2 months in advance to schedule this appointment.  You may see Dr. Nishan or one of the following Advanced Practice Providers on your designated Care Team:   Lori Gerhardt, NP Laura Ingold, NP . Jill McDaniel, NP  

## 2018-10-27 ENCOUNTER — Other Ambulatory Visit: Payer: Self-pay | Admitting: Family Medicine

## 2018-10-27 MED ORDER — HYDROCHLOROTHIAZIDE 12.5 MG PO TABS: 12.5000 mg | ORAL_TABLET | Freq: Every day | ORAL | 1 refills | Status: DC

## 2018-10-27 NOTE — Telephone Encounter (Signed)
See note

## 2018-10-27 NOTE — Telephone Encounter (Signed)
Copied from [place] [phone] [phone]. Topic: Quick Communication - Rx Refill/Question >> Oct 27, 2018  9:14 AM Sheran Luz wrote: Medication: hydrochlorothiazide (HYDRODIURIL) 12.5 MG tablet   Patient is requesting a refill of this medication.  Has the patient contacted their pharmacy? Yes, patient was advised to contact office. Author contacted pharmacy, spoke with tech and was advised that patient has 60 tablets remaining for pick up but patients insurance will only cover a 90 day supply.   Preferred Pharmacy (with phone number or street name):  [id number] - [place], [address] AT [address] [phone] (Phone) [phone] (Fax)

## 2018-10-27 NOTE — Telephone Encounter (Signed)
Per OV 09/29/18 -  Essential hypertension S: controlled on hydrochlorothiazide 12.5 mg. Feels well with this medicine- minimal SE    BP Readings from Last 3 Encounters:  09/29/18 108/78  08/17/18 119/76  07/28/18 136/86  A/P: We discussed blood pressure goal of <140/90. Continue current meds

## 2018-11-05 ENCOUNTER — Ambulatory Visit (INDEPENDENT_AMBULATORY_CARE_PROVIDER_SITE_OTHER): Payer: Medicare Other | Admitting: Psychology

## 2018-11-05 DIAGNOSIS — F4321 Adjustment disorder with depressed mood: Secondary | ICD-10-CM

## 2018-11-07 ENCOUNTER — Encounter (HOSPITAL_COMMUNITY): Payer: Self-pay

## 2018-11-07 ENCOUNTER — Ambulatory Visit (HOSPITAL_COMMUNITY)
Admission: EM | Admit: 2018-11-07 | Discharge: 2018-11-07 | Disposition: A | Discharge status: Home / Self Care | Payer: Medicare Other | Attending: Family Medicine | Admitting: Family Medicine

## 2018-11-07 ENCOUNTER — Other Ambulatory Visit: Payer: Self-pay

## 2018-11-07 DIAGNOSIS — M79671 Pain in right foot: Secondary | ICD-10-CM | POA: Diagnosis not present

## 2018-11-07 MED ORDER — PREDNISONE 10 MG (21) PO TBPK: ORAL_TABLET | Freq: Every day | ORAL | 0 refills | Status: DC

## 2018-11-07 NOTE — ED Triage Notes (Signed)
Pt cc has right foot discomfort. X 3 days

## 2018-11-10 NOTE — ED Provider Notes (Signed)
[name]   [id number] 11/07/18 Arrival Time: 1962  ASSESSMENT & PLAN:  1. Foot pain, right    No indication for x-ray of foot at this time. No signs of infection. Discussed.  Possibly gout. Reports he cannot use NSAIDS.  Meds ordered this encounter  Medications  . predniSONE (STERAPRED UNI-PAK 21 TAB) 10 MG (21) TBPK tablet    Sig: Take by mouth daily. Take as directed.    Dispense:  21 tablet    Refill:  0    Follow-up Information    [name], MD.   Specialty:  Family Medicine Why:  As needed. Contact information: [place] [address] [phone]          Rest the injured area as much as practical. WBAT.  Follow-up Information    [name], MD.   Specialty:  Family Medicine Why:  As needed. Contact information: [place] [address] [phone]          If pain persisting he may need imaging in a week or two to r/o stress fx.  Reviewed expectations re: course of current medical issues. Questions answered. Outlined signs and symptoms indicating need for more acute intervention. Patient verbalized understanding. After Visit Summary given.  SUBJECTIVE: History from: patient. [name] is a 72 y.o. male who reports persistent mild to moderate pain of his right foot; initially around great toe; described as aching without radiation. Onset: gradual, over the past 3 days. Injury/trama: no. Symptoms have progressed to a point and plateaued since beginning. Aggravating factors: weight bearing. Alleviating factors: nothing in particular. Associated symptoms: none reported. Extremity sensation changes or weakness: none. Self treatment: has not tried OTCs for relief of pain. History of similar: no.  Past Surgical History:  Procedure Laterality Date  . AORTIC VALVE REPLACEMENT  2011   [facility]  (bioprosthetic)  . CARDIAC CATHETERIZATION  04/2010   Normal coronaries.  . Carotid  dopplers  08/2015   NORMAL  . CATARACT EXTRACTION     L 12/06/09   R 09/14/09  . COLONOSCOPY  01/26/13   tic's, o/w normal.  ([facility])--recall 10 yrs.  Marland Kitchen PENILE PROSTHESIS IMPLANT    . PROSTATECTOMY  04/2002   for prostate cancer  . Pulmonary nodule resection  Fall/winter 2016   [facility]  . SHOULDER SURGERY     rotator cuff on both arms   . TRANSTHORACIC ECHOCARDIOGRAM  09/20/2014; 03/2016; 03/2017   2015-mild LVH, EF 50-55%, wall motion nl, grade I diast dysfxn, prosth aort valve good,transaortic gradients decreased compared to echo 11/2013.   03/2016: EF 55-60%, normal LV function, severe LVH, normal diast fxn, mild increase in AV gradient compared to 09/2014 echo.  2018: EF 55-60%, normal LV fxn, grd II DD, AV stable/gradient's stable.     ROS: As per HPI. All other systems negative.    OBJECTIVE:  Vitals:   11/07/18 1738 11/07/18 1746  BP: (!) 147/81   Pulse: 74   Resp: 16   Temp: 98.2 F (36.8 C)   TempSrc: Oral   SpO2: 100%   Weight:  91.2 kg    General appearance: alert; no distress Extremities: . RLE: warm and well perfused; poorly localized moderate tenderness over right dorsal medial foot; without gross deformities; with no swelling; with no bruising; ROM: normal of toes and of ankle CV: brisk extremity capillary refill of RLE; 2+ DP and PT pulse of RLE. Skin: warm and dry; no visible rashes Neurologic:  gait normal but favors RLE; able to wear his normal shoes;; normal reflexes of RLE and LLE; normal sensation of RLE and LLE; normal strength of RLE and LLE Psychological: alert and cooperative; normal mood and affect  Allergies  Allergen Reactions  . Hydrocodone-Acetaminophen Shortness Of Breath  . Oxycodone Hcl Shortness Of Breath  . Telmisartan-Hctz Shortness Of Breath and Palpitations    Dizziness, too  . Ramipril   . Aspirin Other (See Comments) and Rash    Other Reaction: confusion Can't take high dose  . Atorvastatin Other (See Comments)    myalgia  . Buspirone  Hcl Other (See Comments)    headache  . Codeine Nausea Only and Other (See Comments)    Other Reaction: confusion  . Codeine Phosphate Itching and Nausea Only  . Hydrocodone-Acetaminophen Nausea Only and Other (See Comments)    Other Reaction: confusion  . Morphine Sulfate Itching  . Paroxetine Other (See Comments)    Paresthesias: R arm, R leg, r side of face  . Rabeprazole Other (See Comments)    headache    Past Medical History:  Diagnosis Date  . Anticoagulants causing adverse effect in therapeutic use   . Anxiety    Paxil seemed to cause odd neuro side effects; better on prozac as of 09/2015  . Aortic regurgitation 04/2007 surgery   Resolved [facility]R at [facility]  . Cervical spondylosis    ESI by [name] in Ortho. 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 Chronic lymphocytic leukemia (CLL), B-cell ([place]) approx 2012   stable on f/u's with [name] (most recent 07/2017)  . History of prostate cancer 2003   Rad prost  . History of pulmonary embolism 2011; 10/2014   Recurrence off of anticoagulation 10/2014: per hem/onc ([name]) pt needs lifelong anticoagulation (hypercoag w/u neg).  . Hyperlipidemia    statin-intolerant, except for crestor low dose.  Marland Kitchen Hypertension   . Migraine syndrome   . Mitral regurgitation   . PAF (paroxysmal atrial fibrillation) ([place])   . Panic attacks    "           "             "                  "                  "                       "                             "                         . Pulmonary nodule, right 2015   RLL: resected at [facility] --VATS; Benign RLL nodule (fungal and AFB stains neg).  Plan is for [facility] thoracic surg to do f/u o/v & CT chest 1 yr   . Right-sided headache 07/2016   Primary stabbing HA or migraine per neuro (Dr. Tomi Likens).  Gabapentin and topamax not tolerated.  These resolved spontaneously.   Social History   Socioeconomic History  . Marital status: Single    Spouse name: Not on file  . Number of children: Not on  file  . Years of education: Not on file  . Highest education level: Not on file  Occupational History    Comment: Retired - Tobacco   Social Needs  . Financial resource strain: Not on file  . Food insecurity:    Worry: Not on file    Inability: Not on file  . Transportation needs:    Medical: Not on file    Non-medical: Not on file  Tobacco Use  . Smoking status: Former Smoker    Packs/day: 1.00    Years: 12.00    Pack years: 12.00    Types: Cigarettes    Start date: 11/28/1968    Last attempt to quit: 01/29/1981    Years since quitting: 37.8  . Smokeless tobacco: Never Used  . Tobacco comment: quit 72 yo   Substance and Sexual Activity  . Alcohol use: Yes    Alcohol/week: 0.0 standard drinks    Comment: occasional  . Drug use: No  . Sexual activity: Not on file  Lifestyle  . Physical activity:    Days per week: Not on file    Minutes per session: Not on file  . Stress: Not on file  Relationships  . Social connections:    Talks on phone: Not on file    Gets together: Not on file    Attends religious service: Not on file    Active member of club or organization: Not on file    Attends meetings of clubs or organizations: Not on file    Relationship status: Not on file  Other Topics Concern  . Not on file  Social History Narrative   Single. No children. Lives alone      Retired from [employer].       Hobbies: travel, Scientist, product/process development, enjoys being outdoors including hiking   Family History  Problem Relation Age of Onset  . Cancer Sister        uterine  . Colon cancer Sister 69  . Stroke Mother   . Prostate cancer Father   . Stroke Sister   . Stroke Brother   . Heart attack Neg Hx    Past Surgical History:  Procedure Laterality Date  . AORTIC VALVE REPLACEMENT  2011   [facility]  (bioprosthetic)  . CARDIAC CATHETERIZATION  04/2010   Normal coronaries.  . Carotid dopplers  08/2015   NORMAL  . CATARACT EXTRACTION     L 12/06/09   R 09/14/09   . COLONOSCOPY  01/26/13   tic's, o/w normal.  ([facility])--recall 10 yrs.  Marland Kitchen PENILE PROSTHESIS IMPLANT    . PROSTATECTOMY  04/2002   for prostate cancer  . Pulmonary nodule resection  Fall/winter 2016   [facility]  . SHOULDER SURGERY     rotator cuff on both arms   . TRANSTHORACIC ECHOCARDIOGRAM  09/20/2014; 03/2016; 03/2017   2015-mild LVH, EF 50-55%, wall motion nl, grade I diast dysfxn, prosth aort valve good,transaortic gradients decreased compared to echo 11/2013.   03/2016: EF 55-60%, normal LV function, severe LVH, normal diast fxn, mild increase in AV gradient compared to 09/2014 echo.  2018: EF 55-60%, normal LV fxn, grd II DD, AV stable/gradient's stable.      Vanessa Kick, MD 11/10/18 [phone]

## 2018-11-12 ENCOUNTER — Encounter: Payer: Self-pay | Admitting: Sports Medicine

## 2018-11-17 ENCOUNTER — Ambulatory Visit: Payer: Medicare Other | Admitting: Sports Medicine

## 2018-11-17 ENCOUNTER — Ambulatory Visit (INDEPENDENT_AMBULATORY_CARE_PROVIDER_SITE_OTHER): Payer: Medicare Other

## 2018-11-17 ENCOUNTER — Encounter: Payer: Self-pay | Admitting: Sports Medicine

## 2018-11-17 VITALS — BP 138/86 | HR 78 | Ht 70.0 in | Wt 204.2 lb

## 2018-11-17 DIAGNOSIS — G8929 Other chronic pain: Secondary | ICD-10-CM

## 2018-11-17 DIAGNOSIS — M7741 Metatarsalgia, right foot: Secondary | ICD-10-CM

## 2018-11-17 DIAGNOSIS — M25571 Pain in right ankle and joints of right foot: Secondary | ICD-10-CM | POA: Diagnosis not present

## 2018-11-17 DIAGNOSIS — M19071 Primary osteoarthritis, right ankle and foot: Secondary | ICD-10-CM | POA: Diagnosis not present

## 2018-11-17 DIAGNOSIS — M216X1 Other acquired deformities of right foot: Secondary | ICD-10-CM

## 2018-11-17 DIAGNOSIS — C9110 Chronic lymphocytic leukemia of B-cell type not having achieved remission: Secondary | ICD-10-CM

## 2018-11-17 DIAGNOSIS — N183 Chronic kidney disease, stage 3 unspecified: Secondary | ICD-10-CM

## 2018-11-17 DIAGNOSIS — Z7901 Long term (current) use of anticoagulants: Secondary | ICD-10-CM

## 2018-11-17 LAB — URIC ACID: Uric Acid, Serum: 6.5 mg/dL (ref 4.0–7.8)

## 2018-11-17 MED ORDER — METHYLPREDNISOLONE 4 MG PO TBPK: ORAL_TABLET | ORAL | 0 refills | Status: DC

## 2018-11-17 NOTE — Progress Notes (Signed)
[name]. [name], [place] at [place] - 73 y.o. male MRN [id number]  Date of birth: 1946-06-23  Visit Date: 11/17/2018  PCP: [name], MD   Referred by: [name], MD   SUBJECTIVE:   Chief Complaint  Patient presents with  . f/u R great toe    Takes Tylenol prn. Custom Orthotics fabricated 10/14/18.     HPI: Patient presents today for follow-up of right ankle and foot pain.  He has been having issues with his right great toe and transverse arch for quite some time he had custom cushioned orthotics made at last visit.  Over the holidays he actually had an acute exacerbation over the right anterior ankle and was seen in urgent care and diagnosed with a possible gout flare.  He is been started on steroids and has had a significant improvement since this occurred but continues to have pain across the anterior medial ankle.  Orthotics do seem to be causing some increased pressure at the heel as well as over the forefoot.  Overall though he is more comfortable than previous to the orthotics but continues to have some anxiety around his ability to hike as much he is wanted with his upcoming trip to [place].  REVIEW OF SYSTEMS: He does have a history of prostate cancer as well as currently being treated for CLL.  He has a history of a aortic valve replacement and is on anticoagulation for this.  HISTORY:  Prior history reviewed and updated per electronic medical record.  Social History   Occupational History    Comment: Retired - Tobacco   Tobacco Use  . Smoking status: Former Smoker    Packs/day: 1.00    Years: 12.00    Pack years: 12.00    Types: Cigarettes    Start date: 11/28/1968    Last attempt to quit: 01/29/1981    Years since quitting: 37.8  . Smokeless tobacco: Never Used  . Tobacco comment: quit 73 yo   Substance and Sexual Activity  . Alcohol use: Yes    Alcohol/week: 0.0  standard drinks    Comment: occasional  . Drug use: No  . Sexual activity: Not on file   Social History   Social History Narrative   Single. No children. Lives alone      Retired from [employer].       Hobbies: travel, Scientist, product/process development, enjoys being outdoors including hiking      [place]:   Recent Labs    12/30/17 1019 02/16/18 1334 08/17/18 1136 09/29/18 1342  CALCIUM  --  9.1 9.7 8.9  AST  --  '25 24 25  '$ ALT  --  '25 26 27  '$ TSH 1.63  --   --   --    No problems updated. No specialty comments available.  OBJECTIVE:  VS:  HT:'5\' 10"'$  (177.8 cm)   WT:204 lb 3.2 oz (92.6 kg)  BMI:29.3    BP:138/86  HR:78bpm  TEMP: ( )  RESP:98 %   PHYSICAL EXAM: Adult male.  No acute distress.  Alert and appropriate. His right ankle is slightly swollen across the anterior medial aspect and tender over the anterior medial joint line as well as over the anterior tibialis tendon and musculature.  He has some pain with syndesmotic squeeze but this is minimal.  Mild pain with cotton testing.  He does have pain at the  insertion of the anterior tibialis tendon onto the medial cuneiform.   ASSESSMENT   1. Chronic pain of right ankle   2. Primary osteoarthritis of right ankle   3. Loss of transverse plantar arch of right foot   4. Metatarsalgia of right foot   5. Chronic lymphocytic leukemia ([place])   6. CKD (chronic kidney disease), stage III ([place])   7. Chronic anticoagulation     PLAN:  Pertinent additional documentation may be included in corresponding procedure notes, imaging studies, problem based documentation and patient instructions.  Procedures:  None  Medications:  Meds ordered this encounter  Medications  . methylPREDNISolone (MEDROL DOSEPAK) 4 MG TBPK tablet    Sig: Take by mouth as directed. Take 6 tablets on the first day prescribed then as directed.    Dispense:  21 tablet    Refill:  0    Discussion/Instructions: No  problem-specific Assessment & Plan notes found for this encounter.  Overall he has had good improvement with the prednisone Dosepak that he was started on.  He does have an upcoming trip to [place] and I am concerned that he potentially could have a secondary flare to the arthritis in his ankle secondary to gout.  We will go and check uric acid level today and have also given him a prescription to take with him on his trip of [place] but I am optimistic that he should be able to avoid this.  Additionally slight modification to his custom cushioned insoles with the addition of a small metatarsal pad was placed and he had good improvement in the slight discomfort across the transverse arch as well as along the heel.  If any lack of improvement can consider intra-articular injection into the ankle.  Return if symptoms worsen or fail to improve.          [name], [place] [facility] Physician

## 2018-11-27 ENCOUNTER — Other Ambulatory Visit: Payer: Self-pay | Admitting: Family Medicine

## 2018-11-30 ENCOUNTER — Encounter: Payer: Self-pay | Admitting: Sports Medicine

## 2018-12-03 ENCOUNTER — Ambulatory Visit (INDEPENDENT_AMBULATORY_CARE_PROVIDER_SITE_OTHER): Payer: Medicare Other | Admitting: Psychology

## 2018-12-03 DIAGNOSIS — F4321 Adjustment disorder with depressed mood: Secondary | ICD-10-CM | POA: Diagnosis not present

## 2018-12-04 ENCOUNTER — Ambulatory Visit (HOSPITAL_COMMUNITY)
Admission: EM | Admit: 2018-12-04 | Discharge: 2018-12-04 | Disposition: A | Discharge status: Home / Self Care | Payer: Medicare Other | Attending: Family Medicine | Admitting: Family Medicine

## 2018-12-04 ENCOUNTER — Encounter (HOSPITAL_COMMUNITY): Payer: Self-pay | Admitting: Emergency Medicine

## 2018-12-04 DIAGNOSIS — G8929 Other chronic pain: Secondary | ICD-10-CM | POA: Diagnosis not present

## 2018-12-04 DIAGNOSIS — M25571 Pain in right ankle and joints of right foot: Secondary | ICD-10-CM | POA: Diagnosis not present

## 2018-12-04 MED ORDER — PREDNISONE 50 MG PO TABS: 50.0000 mg | ORAL_TABLET | Freq: Every day | ORAL | 0 refills | Status: AC

## 2018-12-04 NOTE — Discharge Instructions (Signed)
Please begin taking prednisone daily with food on your stomach for the next 5 days Please ice and elevate your foot Wear ankle brace for further support  Please follow-up with orthopedics as you have planned on Tuesday Please save the prescription given to you from your orthopedic doctor for your trip.

## 2018-12-04 NOTE — ED Triage Notes (Signed)
Pt states hes been dx with gout in his R big toe, has seen his ortho and pcp for similar, states hes not taking any medicine for gout at the moment. Pt states he tried to see his PCP or ortho and was unable to get appt, states he cant wait due to the pain.

## 2018-12-04 NOTE — ED Provider Notes (Signed)
[facility]    CSN: [id number] Arrival date & time: 12/04/18  1001     History   Chief Complaint Chief Complaint  Patient presents with  . Foot Pain    HPI [name] is a 73 y.o. male history of previous PE, paroxysmal A. fib, hypertension, hyperlipidemia, presenting today for evaluation of right ankle pain.  Patient has had chronic issues with his ankle for the past couple months.  He has seen orthopedics for this and recently had an x-ray showing degenerative changes at the tarsal bone.  He was seen here previously and improved on prednisone Dosepak.  He has plans to go to [place] in March and was given a Medrol Dosepak to use as needed for on his trip.  He has had continued pain in the meantime and is wondering what he can use for pain.  He has been using Tylenol.  He occasionally will apply topical lidocaine for use CBD oil.  He practices tai chi frequently.  Denies any new injury with his ankle.  Increased pain with dorsi flexion.  HPI  Past Medical History:  Diagnosis Date  . Anticoagulants causing adverse effect in therapeutic use   . Anxiety    Paxil seemed to cause odd neuro side effects; better on prozac as of 09/2015  . Aortic regurgitation 04/2007 surgery   Resolved [facility]R [facility]  . Cervical spondylosis    ESI by [name] in Ortho. 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 Chronic lymphocytic leukemia (CLL), B-cell ([place]) approx 2012   stable on f/u's with [name] (most recent 07/2017)  . History of prostate cancer 2003   Rad prost  . History of pulmonary embolism 2011; 10/2014   Recurrence off of anticoagulation 10/2014: per hem/onc ([name]) pt needs lifelong anticoagulation (hypercoag w/u neg).  . Hyperlipidemia    statin-intolerant, except for crestor low dose.  Marland Kitchen Hypertension   . Migraine syndrome   . Mitral regurgitation   . PAF (paroxysmal atrial fibrillation) ([place])   . Panic attacks    "           "             "                   "                  "                       "                             "                         . Pulmonary nodule, right 2015   RLL: resected at [facility] --VATS; Benign RLL nodule (fungal and AFB stains neg).  Plan is for [facility] thoracic surg to do f/u o/v & CT chest 1 yr   . Right-sided headache 07/2016   Primary stabbing HA or migraine per neuro (Dr. Tomi Likens).  Gabapentin and topamax not tolerated.  These resolved spontaneously.    Patient Active Problem List   Diagnosis Date Noted  . PTSD (post-traumatic stress disorder) 09/29/2018  . BPPV (benign paroxysmal positional vertigo) 09/04/2017  . CKD (chronic kidney disease), stage III ([place]) 08/11/2017  . Former smoker  08/29/2016  . Cervical spondylosis   . S/P Bioprosthetic AVR (aortic valve replacement) in 2011 03/22/2015  . Recurrent pulmonary embolism ([place]) 11/09/2014  . Lung nodule 11/02/2014  . Chronic lymphocytic leukemia ([place]) 11/11/2013  . Migraine 10/08/2010  . GERD 12/11/2009  . MITRAL REGURGITATION 04/07/2009  . Insomnia 02/03/2008  . Anxiety state 09/06/2007  . HLD (hyperlipidemia) 04/20/2007  . Essential hypertension 04/20/2007  . Atrial fibrillation ([place]) 04/20/2007  . PROSTATE CANCER, HX OF 04/20/2007    Past Surgical History:  Procedure Laterality Date  . AORTIC VALVE REPLACEMENT  2011   [facility]  (bioprosthetic)  . CARDIAC CATHETERIZATION  04/2010   Normal coronaries.  . Carotid dopplers  08/2015   NORMAL  . CATARACT EXTRACTION     L 12/06/09   R 09/14/09  . COLONOSCOPY  01/26/13   tic's, o/w normal.  ([place])--recall 10 yrs.  Marland Kitchen PENILE PROSTHESIS IMPLANT    . PROSTATECTOMY  04/2002   for prostate cancer  . Pulmonary nodule resection  Fall/winter 2016   [facility]  . SHOULDER SURGERY     rotator cuff on both arms   . TRANSTHORACIC ECHOCARDIOGRAM  09/20/2014; 03/2016; 03/2017   2015-mild LVH, EF 50-55%, wall motion nl, grade I diast dysfxn, prosth aort valve good,transaortic gradients decreased compared to echo  11/2013.   03/2016: EF 55-60%, normal LV function, severe LVH, normal diast fxn, mild increase in AV gradient compared to 09/2014 echo.  2018: EF 55-60%, normal LV fxn, grd II DD, AV stable/gradient's stable.       Home Medications    Prior to Admission medications   Medication Sig Start Date End Date Taking? Authorizing Provider  cholecalciferol (VITAMIN D) 1000 units tablet Take 1,000 Units by mouth daily.    [provider]  hydrochlorothiazide (HYDRODIURIL) 12.5 MG tablet Take 1 tablet (12.5 mg total) by mouth daily. 10/27/18   [name], MD  LORazepam (ATIVAN) 0.5 MG tablet TAKE 1 TABLET EVERY DAY AS NEEDED FOR ANXIETY 08/21/18   [name], MD  meclizine (ANTIVERT) 12.5 MG tablet TAKE 1 TABLET BY MOUTH 3 TIMES A DAY AS NEEDED FOR DIZZINESS 11/27/18   [name], MD  methylPREDNISolone (MEDROL DOSEPAK) 4 MG TBPK tablet Take by mouth as directed. Take 6 tablets on the first day prescribed then as directed. Patient not taking: Reported on 12/04/2018 11/17/18   [name], DO  predniSONE (DELTASONE) 50 MG tablet Take 1 tablet (50 mg total) by mouth daily for 5 days. 12/04/18 12/09/18  Idil Maslanka C, PA-C  rivaroxaban (XARELTO) 20 MG TABS tablet Take 1 tablet (20 mg total) by mouth daily with supper. 02/06/18   [name], MD  rosuvastatin (CRESTOR) 10 MG tablet Take 1 tablet (10 mg total) by mouth daily. 01/21/18   [name], MD  sertraline (ZOLOFT) 100 MG tablet Take 100 mg by mouth daily.    [provider]    Family History Family History  Problem Relation Age of Onset  . Cancer Sister        uterine  . Colon cancer Sister 59  . Stroke Mother   . Prostate cancer Father   . Stroke Sister   . Stroke Brother   . Heart attack Neg Hx     Social History Social History   Tobacco Use  . Smoking status: Former Smoker    Packs/day: 1.00    Years: 12.00    Pack years: 12.00    Types: Cigarettes  Start date: 11/28/1968     Last attempt to quit: 01/29/1981    Years since quitting: 37.8  . Smokeless tobacco: Never Used  . Tobacco comment: quit 73 yo   Substance Use Topics  . Alcohol use: Yes    Alcohol/week: 0.0 standard drinks    Comment: occasional  . Drug use: No     Allergies   Hydrocodone-acetaminophen; Oxycodone hcl; Telmisartan-hctz; Ramipril; Aspirin; Atorvastatin; Buspirone hcl; Codeine; Codeine phosphate; Hydrocodone-acetaminophen; Morphine sulfate; Paroxetine; and Rabeprazole   Review of Systems Review of Systems  Constitutional: Negative for fatigue and fever.  Eyes: Negative for redness, itching and visual disturbance.  Respiratory: Negative for shortness of breath.   Cardiovascular: Negative for chest pain and leg swelling.  Gastrointestinal: Negative for nausea and vomiting.  Musculoskeletal: Positive for arthralgias. Negative for myalgias.  Skin: Negative for color change, rash and wound.  Neurological: Negative for dizziness, syncope, weakness, light-headedness and headaches.     Physical Exam Triage Vital Signs ED Triage Vitals  Enc Vitals Group     BP 12/04/18 1036 112/69     Pulse Rate 12/04/18 1036 88     Resp 12/04/18 1036 18     Temp 12/04/18 1036 98 F (36.7 C)     Temp src --      SpO2 12/04/18 1036 100 %     Weight --      Height --      Head Circumference --      Peak Flow --      Pain Score 12/04/18 1039 0     Pain Loc --      Pain Edu? --      Excl. in [place]? --    No data found.  Updated Vital Signs BP 112/69   Pulse 88   Temp 98 F (36.7 C)   Resp 18   SpO2 100%   Visual Acuity Right Eye Distance:   Left Eye Distance:   Bilateral Distance:    Right Eye Near:   Left Eye Near:    Bilateral Near:     Physical Exam Vitals signs and nursing note reviewed.  Constitutional:      Appearance: He is well-developed.     Comments: No acute distress  HENT:     Head: Normocephalic and atraumatic.     Nose: Nose normal.  Eyes:      Conjunctiva/sclera: Conjunctivae normal.  Neck:     Musculoskeletal: Neck supple.  Cardiovascular:     Rate and Rhythm: Normal rate.  Pulmonary:     Effort: Pulmonary effort is normal. No respiratory distress.  Abdominal:     General: There is no distension.  Musculoskeletal: Normal range of motion.     Comments: Right ankle: Mild swelling overlying anterior aspect of ankle, mild erythema over proximal dorsum of foot, nontender over medial lateral malleolus, nontender to base of right great toe, full active range of motion of ankle, mildly increased warmth without significant erythema  Skin:    General: Skin is warm and dry.  Neurological:     Mental Status: He is alert and oriented to person, place, and time.      UC Treatments / Results  Labs (all labs ordered are listed, but only abnormal results are displayed) Labs Reviewed - No data to display  EKG None  Radiology No results found.  Procedures Procedures (including critical care time)  Medications Ordered in UC Medications - No data to display  Initial Impression / Assessment and Plan /  UC Course  I have reviewed the triage vital signs and the nursing notes.  Pertinent labs & imaging results that were available during my care of the patient were reviewed by me and considered in my medical decision making (see chart for details).     Patient likely with arthritis flare, exam less suggestive of gout.  Will defer any further imaging as patient recently had imaging a couple weeks ago without any new injury.  On Xarelto, will avoid NSAIDs.  Will do another trial of prednisone 50 mg x 5 days, in hopes to subside pain before his trip.  Follow-up with orthopedics as planned on Tuesday.Discussed strict return precautions. Patient verbalized understanding and is agreeable with plan.  Final Clinical Impressions(s) / UC Diagnoses   Final diagnoses:  Chronic pain of right ankle     Discharge Instructions     Please begin  taking prednisone daily with food on your stomach for the next 5 days Please ice and elevate your foot Wear ankle brace for further support  Please follow-up with orthopedics as you have planned on Tuesday Please save the prescription given to you from your orthopedic doctor for your trip.   ED Prescriptions    Medication Sig Dispense Auth. Provider   predniSONE (DELTASONE) 50 MG tablet Take 1 tablet (50 mg total) by mouth daily for 5 days. 5 tablet Haeley Fordham C, PA-C     Controlled Substance Prescriptions [place] Controlled Substance Registry consulted? Not Applicable   Janith Lima, [place] 12/04/18 1126

## 2018-12-14 ENCOUNTER — Inpatient Hospital Stay: Payer: Medicare Other

## 2018-12-14 ENCOUNTER — Other Ambulatory Visit: Payer: Self-pay

## 2018-12-14 ENCOUNTER — Encounter: Payer: Self-pay | Admitting: Hematology & Oncology

## 2018-12-14 ENCOUNTER — Ambulatory Visit (HOSPITAL_BASED_OUTPATIENT_CLINIC_OR_DEPARTMENT_OTHER)
Admission: RE | Admit: 2018-12-14 | Discharge: 2018-12-14 | Disposition: A | Discharge status: Home / Self Care | Payer: Medicare Other | Source: Ambulatory Visit | Attending: Hematology & Oncology | Admitting: Hematology & Oncology

## 2018-12-14 ENCOUNTER — Inpatient Hospital Stay: Payer: Medicare Other | Attending: Hematology & Oncology | Admitting: Hematology & Oncology

## 2018-12-14 VITALS — BP 132/91 | HR 72 | Temp 98.3°F | Resp 16 | Wt 211.0 lb

## 2018-12-14 DIAGNOSIS — R918 Other nonspecific abnormal finding of lung field: Secondary | ICD-10-CM | POA: Diagnosis not present

## 2018-12-14 DIAGNOSIS — R911 Solitary pulmonary nodule: Secondary | ICD-10-CM

## 2018-12-14 DIAGNOSIS — C9110 Chronic lymphocytic leukemia of B-cell type not having achieved remission: Secondary | ICD-10-CM

## 2018-12-14 DIAGNOSIS — Z7901 Long term (current) use of anticoagulants: Secondary | ICD-10-CM | POA: Diagnosis not present

## 2018-12-14 DIAGNOSIS — I2699 Other pulmonary embolism without acute cor pulmonale: Secondary | ICD-10-CM

## 2018-12-14 DIAGNOSIS — Z79899 Other long term (current) drug therapy: Secondary | ICD-10-CM | POA: Diagnosis not present

## 2018-12-14 DIAGNOSIS — Z856 Personal history of leukemia: Secondary | ICD-10-CM

## 2018-12-14 LAB — CBC WITH DIFFERENTIAL (CANCER CENTER ONLY)
Abs Immature Granulocytes: 0.03 10*3/uL (ref 0.00–0.07)
Basophils Absolute: 0 10*3/uL (ref 0.0–0.1)
Basophils Relative: 0 %
Eosinophils Absolute: 0.1 10*3/uL (ref 0.0–0.5)
Eosinophils Relative: 1 %
HCT: 45.4 % (ref 39.0–52.0)
Hemoglobin: 15 g/dL (ref 13.0–17.0)
Immature Granulocytes: 1 %
Lymphocytes Relative: 51 %
Lymphs Abs: 2.4 10*3/uL (ref 0.7–4.0)
MCH: 32.1 pg (ref 26.0–34.0)
MCHC: 33 g/dL (ref 30.0–36.0)
MCV: 97 fL (ref 80.0–100.0)
Monocytes Absolute: 0.5 10*3/uL (ref 0.1–1.0)
Monocytes Relative: 11 %
Neutro Abs: 1.7 10*3/uL (ref 1.7–7.7)
Neutrophils Relative %: 36 %
Platelet Count: 149 10*3/uL — ABNORMAL LOW (ref 150–400)
RBC: 4.68 MIL/uL (ref 4.22–5.81)
RDW: 12.9 % (ref 11.5–15.5)
WBC Count: 4.7 10*3/uL (ref 4.0–10.5)
nRBC: 0 % (ref 0.0–0.2)

## 2018-12-14 LAB — CMP (CANCER CENTER ONLY)
ALT: 17 U/L (ref 0–44)
AST: 14 U/L — ABNORMAL LOW (ref 15–41)
Albumin: 4.5 g/dL (ref 3.5–5.0)
Alkaline Phosphatase: 49 U/L (ref 38–126)
Anion gap: 7 (ref 5–15)
BUN: 20 mg/dL (ref 8–23)
CO2: 31 mmol/L (ref 22–32)
Calcium: 9.7 mg/dL (ref 8.9–10.3)
Chloride: 103 mmol/L (ref 98–111)
Creatinine: 1.33 mg/dL — ABNORMAL HIGH (ref 0.61–1.24)
GFR, Est AFR Am: >60 mL/min (ref >60)
GFR, Estimated: 53 mL/min — ABNORMAL LOW (ref >60)
Glucose, Bld: 123 mg/dL — ABNORMAL HIGH (ref 70–99)
Potassium: 4.1 mmol/L (ref 3.5–5.1)
Sodium: 141 mmol/L (ref 135–145)
Total Bilirubin: 0.5 mg/dL (ref 0.3–1.2)
Total Protein: 6.4 g/dL — ABNORMAL LOW (ref 6.5–8.1)

## 2018-12-14 LAB — LACTATE DEHYDROGENASE: LDH: 282 U/L — ABNORMAL HIGH (ref 98–192)

## 2018-12-14 MED ORDER — IOPAMIDOL (ISOVUE-300) INJECTION 61%
100.0000 mL | Freq: Once | INTRAVENOUS | Status: AC | PRN
  Administered 2018-12-14: 100 mL via INTRAVENOUS

## 2018-12-14 NOTE — Progress Notes (Signed)
Please call patient and see how he is feeling.  If he is continuing to have pain I would like to send in a prescription for colchicine and likely start him on allopurinol.  6.5 is actually the upper limit of normal for gout and if he is continuing to have issues trying to lower these numbers would be beneficial.  Please call and check on his progress as well as follow-up on the telephone call from 11/30/2018

## 2018-12-14 NOTE — Progress Notes (Signed)
Hematology and Oncology Follow Up Visit  [name] [facility] [id number] 1946/03/04 73 y.o. 12/14/2018   Principle Diagnosis:   Right lower lobe pulmonary nodule- non-malignant  Recurrent pulmonary embolism  Progressive  Current Therapy:    Xarelto 20 mg by mouth daily       Rituxan/bendamustine-s/p cycle #2 - given 06/18/2017      Interim History:  [name] is back for follow-up.  The big news is that he now has a walking boot on his right foot.  He is not sure exactly as to what he did.  He is seen orthopedic surgery.  Had an MRI of the right foot over the weekend.  Unfortunately, it was done outside the [facility] system so I do not have the results.  The results I do have is his CT scan that he had done today.  He had a CT of the chest/abdomen/pelvis.  It does show some borderline enlarged axillary lymph nodes.  Lymph node measures 1.5 cm in the left axilla.  He also has some enlarged lymph nodes in the mesentery.  He is still going to go to [place] in a month.  He has been looking forward to this trip for a long time.  He is on blood thinner with Xarelto so I am not too worried about him having blood clots.  He is not sure if he will be able to go down to [place] without the walking boot.  He has had no symptoms referrable to his CLL.  He has had no fever.  He has had no cough.  He has had no palpable lymph nodes.  There is been no bleeding.  He has had no change in bowel or bladder habits.  Overall, his performance status is ECOG 0.  Medications:  Current Outpatient Medications:  .  cholecalciferol (VITAMIN D) 1000 units tablet, Take 1,000 Units by mouth daily., Disp: , Rfl:  .  hydrochlorothiazide (HYDRODIURIL) 12.5 MG tablet, Take 1 tablet (12.5 mg total) by mouth daily., Disp: 90 tablet, Rfl: 1 .  LORazepam (ATIVAN) 0.5 MG tablet, TAKE 1 TABLET EVERY DAY AS NEEDED FOR ANXIETY, Disp: 30 tablet, Rfl: 5 .  meclizine (ANTIVERT) 12.5 MG tablet, TAKE 1 TABLET BY MOUTH 3 TIMES A DAY  AS NEEDED FOR DIZZINESS, Disp: 30 tablet, Rfl: 3 .  methylPREDNISolone (MEDROL DOSEPAK) 4 MG TBPK tablet, Take by mouth as directed. Take 6 tablets on the first day prescribed then as directed. (Patient not taking: Reported on 12/04/2018), Disp: 21 tablet, Rfl: 0 .  rivaroxaban (XARELTO) 20 MG TABS tablet, Take 1 tablet (20 mg total) by mouth daily with supper., Disp: 90 tablet, Rfl: 3 .  rosuvastatin (CRESTOR) 10 MG tablet, Take 1 tablet (10 mg total) by mouth daily., Disp: 90 tablet, Rfl: 3 .  sertraline (ZOLOFT) 100 MG tablet, Take 100 mg by mouth daily., Disp: , Rfl:   Allergies:  Allergies  Allergen Reactions  . Hydrocodone-Acetaminophen Shortness Of Breath  . Oxycodone Hcl Shortness Of Breath  . Telmisartan-Hctz Shortness Of Breath and Palpitations    Dizziness, too  . Ramipril   . Aspirin Other (See Comments) and Rash    Other Reaction: confusion Can't take high dose  . Atorvastatin Other (See Comments)    myalgia  . Buspirone Hcl Other (See Comments)    headache  . Codeine Nausea Only and Other (See Comments)    Other Reaction: confusion  . Codeine Phosphate Itching and Nausea Only  . Hydrocodone-Acetaminophen Nausea Only  and Other (See Comments)    Other Reaction: confusion  . Morphine Sulfate Itching  . Paroxetine Other (See Comments)    Paresthesias: R arm, R leg, r side of face  . Rabeprazole Other (See Comments)    headache    Past Medical History, Surgical history, Social history, and Family History were reviewed and updated.  Review of Systems: Review of Systems  Constitutional: Negative.   HENT: Negative.   Eyes: Negative.   Respiratory: Negative.   Cardiovascular: Negative.   Gastrointestinal: Negative.   Genitourinary: Negative.   Musculoskeletal: Negative.   Skin: Negative.   Neurological: Negative.   Endo/Heme/Allergies: Negative.   Psychiatric/Behavioral: Negative.     Physical Exam:  weight is 211 lb (95.7 kg). His oral temperature is 98.3 F  (36.8 C). His blood pressure is 132/91 (abnormal) and his pulse is 72. His respiration is 16 and oxygen saturation is 100%.   Physical Exam Vitals signs reviewed.  HENT:     Head: Normocephalic and atraumatic.  Eyes:     Pupils: Pupils are equal, round, and reactive to light.  Neck:     Musculoskeletal: Normal range of motion.  Cardiovascular:     Rate and Rhythm: Normal rate and regular rhythm.     Heart sounds: Normal heart sounds.  Pulmonary:     Effort: Pulmonary effort is normal.     Breath sounds: Normal breath sounds.  Abdominal:     General: Bowel sounds are normal.     Palpations: Abdomen is soft.  Musculoskeletal: Normal range of motion.        General: No tenderness or deformity.  Lymphadenopathy:     Cervical: No cervical adenopathy.  Skin:    General: Skin is warm and dry.     Findings: No erythema or rash.  Neurological:     Mental Status: He is alert and oriented to person, place, and time.  Psychiatric:        Behavior: Behavior normal.        Thought Content: Thought content normal.        Judgment: Judgment normal.     Lab Results  Component Value Date   WBC 4.7 12/14/2018   HGB 15.0 12/14/2018   HCT 45.4 12/14/2018   MCV 97.0 12/14/2018   PLT 149 (L) 12/14/2018     Chemistry      Component Value Date/Time   NA 141 12/14/2018 1006   NA 145 07/22/2017 0756   NA 139 06/27/2016 1410   K 4.1 12/14/2018 1006   K 4.3 07/22/2017 0756   K 5.1 06/27/2016 1410   CL 103 12/14/2018 1006   CL 105 07/22/2017 0756   CO2 31 12/14/2018 1006   CO2 30 07/22/2017 0756   CO2 28 06/27/2016 1410   BUN 20 12/14/2018 1006   BUN 17 07/22/2017 0756   BUN 20.9 06/27/2016 1410   CREATININE 1.33 (H) 12/14/2018 1006   CREATININE 1.6 (H) 07/22/2017 0756   CREATININE 1.6 (H) 06/27/2016 1410      Component Value Date/Time   CALCIUM 9.7 12/14/2018 1006   CALCIUM 9.6 07/22/2017 0756   CALCIUM 9.6 06/27/2016 1410   ALKPHOS 49 12/14/2018 1006   ALKPHOS 59 07/22/2017  0756   ALKPHOS 62 06/27/2016 1410   AST 14 (L) 12/14/2018 1006   AST 18 06/27/2016 1410   ALT 17 12/14/2018 1006   ALT 24 07/22/2017 0756   ALT 24 06/27/2016 1410   BILITOT 0.5 12/14/2018 1006   BILITOT  0.46 06/27/2016 1410         Impression and Plan: Mr. Earnest is 73 year-old African-American gentleman. He has recurrent pulmonary embolism. I think he needs lifelong anticoagulation.He is doing very well on Xarelto. He enjoys being on Xarelto.  I would like to see him back in 3 months.  I do not see any problems from a hematologic point of view with him going down to [place].  Again, he is on Xarelto.  If we do find that he has recurrent CLL, then I would definitely consider him for acalabrutinib.  I know that there is a another BTK inhibitor - Zanubrutinib -that the FDA has approved or will improve soon.  I would like to get a CT scan when we see him back.  If we find that he has significant disease progression, then we will consider him for oral therapy.  Hopefully, the walking boot will be coming off his right leg.  I want to make sure that we get a molecular panel for his CLL when we see him back.    Marland KitchenVolanda Napoleon, MD 2/3/20201:04 PM

## 2018-12-14 NOTE — Telephone Encounter (Signed)
See result notes. 

## 2018-12-15 LAB — IGG, IGA, IGM
IgA: 105 mg/dL (ref 61–437)
IgG (Immunoglobin G), Serum: 742 mg/dL (ref 700–1600)
IgM (Immunoglobulin M), Srm: 83 mg/dL (ref 15–143)

## 2018-12-31 ENCOUNTER — Ambulatory Visit: Payer: Medicare Other | Admitting: Psychology

## 2019-01-04 ENCOUNTER — Other Ambulatory Visit: Payer: Self-pay | Admitting: Family Medicine

## 2019-01-04 DIAGNOSIS — M79661 Pain in right lower leg: Secondary | ICD-10-CM

## 2019-01-05 ENCOUNTER — Ambulatory Visit
Admission: RE | Admit: 2019-01-05 | Discharge: 2019-01-05 | Disposition: A | Discharge status: Home / Self Care | Payer: Medicare Other | Source: Ambulatory Visit | Attending: Family Medicine | Admitting: Family Medicine

## 2019-01-05 DIAGNOSIS — M79661 Pain in right lower leg: Secondary | ICD-10-CM

## 2019-01-05 MED ORDER — GADOBENATE DIMEGLUMINE 529 MG/ML IV SOLN
18.0000 mL | Freq: Once | INTRAVENOUS | Status: AC | PRN
  Administered 2019-01-05: 18 mL via INTRAVENOUS

## 2019-01-06 ENCOUNTER — Ambulatory Visit (INDEPENDENT_AMBULATORY_CARE_PROVIDER_SITE_OTHER): Payer: Medicare Other | Admitting: Psychology

## 2019-01-06 ENCOUNTER — Other Ambulatory Visit: Payer: Self-pay | Admitting: Family Medicine

## 2019-01-06 DIAGNOSIS — F4321 Adjustment disorder with depressed mood: Secondary | ICD-10-CM

## 2019-01-06 DIAGNOSIS — E7800 Pure hypercholesterolemia, unspecified: Secondary | ICD-10-CM

## 2019-01-17 ENCOUNTER — Encounter: Payer: Self-pay | Admitting: Family Medicine

## 2019-01-25 ENCOUNTER — Encounter: Payer: Self-pay | Admitting: Family Medicine

## 2019-01-27 NOTE — Telephone Encounter (Signed)
Dr. Yong Channel this message is from 01/17/19 and there is also another message in encounters from 01/25/19.  Should we schedule an appointment for a physical as Pt has been out of the country.  Please advise, thank you.

## 2019-01-28 ENCOUNTER — Ambulatory Visit: Payer: Self-pay | Admitting: Psychology

## 2019-02-01 ENCOUNTER — Other Ambulatory Visit: Payer: Self-pay | Admitting: Family Medicine

## 2019-02-01 ENCOUNTER — Ambulatory Visit (INDEPENDENT_AMBULATORY_CARE_PROVIDER_SITE_OTHER): Payer: Medicare Other | Admitting: Psychology

## 2019-02-01 DIAGNOSIS — F4321 Adjustment disorder with depressed mood: Secondary | ICD-10-CM | POA: Diagnosis not present

## 2019-02-02 NOTE — Telephone Encounter (Signed)
Last OV 09/29/2018 Last refill 11/27/2018 #30/3 Next OV 05/04/2019

## 2019-02-12 ENCOUNTER — Encounter: Payer: Self-pay | Admitting: Family Medicine

## 2019-02-12 ENCOUNTER — Ambulatory Visit (INDEPENDENT_AMBULATORY_CARE_PROVIDER_SITE_OTHER): Payer: Medicare Other | Admitting: Family Medicine

## 2019-02-12 VITALS — Temp 97.0°F | Ht 70.0 in | Wt 197.0 lb

## 2019-02-12 DIAGNOSIS — I10 Essential (primary) hypertension: Secondary | ICD-10-CM

## 2019-02-12 DIAGNOSIS — I2699 Other pulmonary embolism without acute cor pulmonale: Secondary | ICD-10-CM

## 2019-02-12 DIAGNOSIS — J392 Other diseases of pharynx: Secondary | ICD-10-CM

## 2019-02-12 DIAGNOSIS — E663 Overweight: Secondary | ICD-10-CM

## 2019-02-12 DIAGNOSIS — I480 Paroxysmal atrial fibrillation: Secondary | ICD-10-CM | POA: Diagnosis not present

## 2019-02-12 DIAGNOSIS — Z6828 Body mass index (BMI) 28.0-28.9, adult: Secondary | ICD-10-CM

## 2019-02-12 NOTE — Patient Instructions (Addendum)
Video visit  We went over other potential COVID-19 symptoms and patient will let me know if he develops these-will alert [name] for any new or worsening symptoms  We also reviewed chronic conditions which appear to be largely stable-so he is can I try to check his blood pressure make sure controlled at home when he gets the "nerve"

## 2019-02-12 NOTE — Progress Notes (Signed)
Phone [phone]   Subjective:  Virtual visit via Video note  Our team/I connected with [name] on 02/12/19 at  1:40 PM EDT by a video enabled telemedicine application (webex) and verified that I am speaking with the correct person using two identifiers.  Location patient: Home-O2 Location provider: [facility], office Persons participating in the virtual visit:  patient  Our team/I discussed the limitations of evaluation and management by telemedicine and the availability of in person appointments. In light of current covid-19 pandemic, patient also understands that we are trying to protect them by minimizing in office contact if at all possible.  The patient expressed consent for telemedicine visit and agreed to proceed.   ROS- no chest pain or shortness of breath. No fever. No chills. No diarrhea or stomach upset.    Past Medical History-  Patient Active Problem List   Diagnosis Date Noted  . S/P Bioprosthetic AVR (aortic valve replacement) in 2011 03/22/2015    Priority: High  . Recurrent pulmonary embolism ([place]) 11/09/2014    Priority: High  . Chronic lymphocytic leukemia ([place]) 11/11/2013    Priority: High  . Atrial fibrillation ([place]) 04/20/2007    Priority: High  . PROSTATE CANCER, HX OF 04/20/2007    Priority: High  . CKD (chronic kidney disease), stage III ([place]) 08/11/2017    Priority: Medium  . Cervical spondylosis     Priority: Medium  . Migraine 10/08/2010    Priority: Medium  . Insomnia 02/03/2008    Priority: Medium  . Anxiety state 09/06/2007    Priority: Medium  . HLD (hyperlipidemia) 04/20/2007    Priority: Medium  . Essential hypertension 04/20/2007    Priority: Medium  . Former smoker 08/29/2016    Priority: Low  . Lung nodule 11/02/2014    Priority: Low  . GERD 12/11/2009    Priority: Low  . MITRAL REGURGITATION 04/07/2009    Priority: Low  . PTSD (post-traumatic stress disorder) 09/29/2018  . BPPV (benign paroxysmal positional vertigo)  09/04/2017    Medications- reviewed and updated Current Outpatient Medications  Medication Sig Dispense Refill  . cholecalciferol (VITAMIN D) 1000 units tablet Take 1,000 Units by mouth daily.    . hydrochlorothiazide (HYDRODIURIL) 12.5 MG tablet Take 1 tablet (12.5 mg total) by mouth daily. 90 tablet 1  . LORazepam (ATIVAN) 0.5 MG tablet TAKE 1 TABLET EVERY DAY AS NEEDED FOR ANXIETY 30 tablet 5  . meclizine (ANTIVERT) 12.5 MG tablet TAKE 1 TABLET BY MOUTH 3 TIMES A DAY AS NEEDED FOR DIZZINESS 30 tablet 3  . rivaroxaban (XARELTO) 20 MG TABS tablet Take 1 tablet (20 mg total) by mouth daily with supper. 90 tablet 3  . rosuvastatin (CRESTOR) 10 MG tablet TAKE 1 TABLET BY MOUTH EVERY DAY 90 tablet 3  . sertraline (ZOLOFT) 100 MG tablet Take 100 mg by mouth daily.     No current facility-administered medications for this visit.      Objective:  Temp (!) 97 F (36.1 C)   Ht 5\' 10"  (1.778 m)   Wt 197 lb (89.4 kg)   BMI 28.27 kg/m  Gen: NAD, resting comfortably Lungs: nonlabored, normal respiratory rate  Skin: appears dry, no obvious rash Normal speech     Assessment and Plan   # Throat irritation S:woke up this morning with scratchy throat. Has started drinking tea and honey and that has helped.  Has been doing some yardwork including yesterday- trimming bushes yesterday.   He has been indoors vast majority of  time for last few weeks- had been in [place] until march 15. Was primarily with himself and a guide so didn't have a ton of contact other than a few days. No known contacts with covid 19.  A/P: Suspect mild allergic rhinitis symptoms especially given complete resolution with honey/tea.  Patient is going to monitor for any other development of symptoms that could be consistent with COVID-19-that was patient's primary concern and reason for call-I think the probability that is pretty low.  He will continue to monitor  # Weight management/overweight S:patient has lost  several poiunds- was very active in [place] even with a tendon in his foot that apparently needs repair- just iced his foot when he got out.  Tried to eat a healthy diet on hsi trip and that also helped.  A/P: Patient has made some improvement from last weight- encouraged him to continue his efforts for healthy eating and regular exercise.  #hypertension S: controlled on hydrochlorothiazide 12.5 mg in the past in office here-last blood pressure was at oncology and there was no repeat to see if it trended down BP Readings from Last 3 Encounters:  12/14/18 (!) 132/91  12/04/18 112/69  11/17/18 138/86  A/P: I asked patient to check her blood pressure today for me-he has intense anxiety related to blood pressure checks and he would prefer to check this again when he comes in the office next visit- due to CLL history we will try to keep him out of the office in light of coronavirus/COVID-19 pandemic   #Paroxysmal atrial fibrillation/recurrent PE S: Patient is compliant with Xarelto for anticoagulation.  Also remains on Xarelto for history of recurrent PE he has been rate controlled without medication-he is also not clearly been back in atrial fibrillation since aortic valve replacement in 2011.  He did not tolerate beta-blockers due to vivid dreams A/P:  Stable x2. Continue current medications.      Future Appointments  Date Time Provider [place]  02/25/2019  1:00 PM Oren Binet, PhD [facility] None  03/16/2019 10:30 AM [facility] None  03/16/2019 11:00 AM [facility]  03/16/2019 11:30 AM [facility]  03/16/2019 12:00 PM Ennever, Rudell Cobb, MD [facility] None  03/25/2019  1:00 PM Oren Binet, PhD [facility] None  04/26/2019 10:15 AM Josue Hector, MD [facility]  05/04/2019 10:40 AM Yong Channel Brayton Mars, MD [facility]   Lab/Order associations: Throat irritation  Essential hypertension  Recurrent pulmonary embolism  (HCC)  Paroxysmal atrial fibrillation (HCC)  Overweight  BMI 28.0-28.9,adult  Return precautions advised.  Garret Reddish, MD

## 2019-02-24 ENCOUNTER — Telehealth: Payer: Self-pay | Admitting: Cardiovascular Disease

## 2019-02-24 NOTE — Telephone Encounter (Signed)
See below for clearance.

## 2019-02-24 NOTE — Progress Notes (Signed)
Chart reviewed with Dr Ambrose Pancoast, aware that patient spent a month in [place], returned to [name] 01-24-19. Has hx CLL, AVR, a-fib, recurrent PE, DM and HTN. Not talked to patient, message left on phone. OK for [facility].

## 2019-02-24 NOTE — Telephone Encounter (Signed)
I s/w Caryl Pina at [facility] in regards to clearance faxed over for pt's upcoming surgery. I explained to her that I did not see clearance yet in Epic though I can take the info verbally and get surgery clearance started as pt's surgery is set for 03/02/19.      [facility] Pre-operative Risk Assessment    Request for surgical clearance:  1. What type of surgery is being performed? REPAIR vs RECONSTRUCTION OF RIGHT TIBIAL TENDON   2. When is this surgery scheduled? 03/02/19   3. What type of clearance is required (medical clearance vs. Pharmacy clearance to hold med vs. Both)? BOTH  4. Are there any medications that need to be held prior to surgery and how long? [place]   5. Practice name and name of physician performing surgery? [facility]; DR. Harrell Gave ADAIR   6. What is your office phone number [phone]   7.   What is your office fax number [phone]  8.   Anesthesia type (None, local, MAC, general) ? GENERAL   Julaine Hua 02/24/2019, 1:26 PM  _________________________________________________________________   (provider comments below)

## 2019-02-24 NOTE — Telephone Encounter (Signed)
   Primary Cardiologist: Jenkins Rouge, MD  Chart reviewed as part of pre-operative protocol coverage. Patient was contacted 02/24/2019 in reference to pre-operative risk assessment for pending surgery as outlined below.  [name] was last seen on 10/26/2018 by Dr. [name].  Since that day, [name] has done well from a cardiac standpoint. He went hiking in [place] in March up to 8 miles at a time with no exertional shortness of breath or chest discomfort. He has a hx of bioprosthetic AVR and has had no heart failure type symptoms. He has hx of PAF and has had no symptoms of afib and his Apple watch has not shown any afib.  Therefore, based on ACC/AHA guidelines, the patient would be at acceptable risk for the planned procedure without further cardiovascular testing.   According to our pharmacy protocol:        Patient with diagnosis of afib and history of recurrent PE on Xarelto for anticoagulation.    CHADS2-VASc score of  2 (CHF, HTN, AGE, DM2, stroke/tia x 2, CAD, AGE, male) CrCl 90ml/min  Per office protocol, patient can hold Xarelto for 24 hours prior to procedure given history of recurrent VTE, would recommend resume Xarelto as soon as safe post procedure.         I will route this recommendation to the requesting party via Epic fax function and remove from pre-op pool.  Please call with questions.  Daune Perch, NP 02/24/2019, 3:11 PM

## 2019-02-24 NOTE — Telephone Encounter (Signed)
New Message:    Caryl Pina from [facility] calling concering a medical clearance that was faxed. Patient surgery is Tuesday.please call Caryl Pina.

## 2019-02-24 NOTE — Telephone Encounter (Signed)
Patient with diagnosis of afib and history of recurrent PE on Xarelto for anticoagulation.    Procedure: repair/reconstruction of tibial tendon Date of procedure: 03/02/19  CHADS2-VASc score of  2 (CHF, HTN, AGE, DM2, stroke/tia x 2, CAD, AGE, male)  CrCl 69ml/min  Per office protocol, patient can hold Xarelto for 24 hours prior to procedure given history of recurrent VTE, would recommend resume Xarelto as soon as safe post procedure.

## 2019-02-25 ENCOUNTER — Ambulatory Visit (INDEPENDENT_AMBULATORY_CARE_PROVIDER_SITE_OTHER): Payer: Medicare Other | Admitting: Psychology

## 2019-02-25 ENCOUNTER — Other Ambulatory Visit: Payer: Self-pay | Admitting: Orthopaedic Surgery

## 2019-02-25 ENCOUNTER — Encounter (HOSPITAL_BASED_OUTPATIENT_CLINIC_OR_DEPARTMENT_OTHER)
Admission: RE | Admit: 2019-02-25 | Discharge: 2019-02-25 | Disposition: A | Discharge status: Home / Self Care | Payer: Medicare Other | Source: Ambulatory Visit | Attending: Orthopaedic Surgery | Admitting: Orthopaedic Surgery

## 2019-02-25 ENCOUNTER — Other Ambulatory Visit: Payer: Self-pay

## 2019-02-25 ENCOUNTER — Encounter (HOSPITAL_BASED_OUTPATIENT_CLINIC_OR_DEPARTMENT_OTHER): Payer: Self-pay | Admitting: *Deleted

## 2019-02-25 DIAGNOSIS — I340 Nonrheumatic mitral (valve) insufficiency: Secondary | ICD-10-CM | POA: Diagnosis not present

## 2019-02-25 DIAGNOSIS — S86811A Strain of other muscle(s) and tendon(s) at lower leg level, right leg, initial encounter: Secondary | ICD-10-CM | POA: Diagnosis not present

## 2019-02-25 DIAGNOSIS — F419 Anxiety disorder, unspecified: Secondary | ICD-10-CM | POA: Diagnosis not present

## 2019-02-25 DIAGNOSIS — E785 Hyperlipidemia, unspecified: Secondary | ICD-10-CM | POA: Diagnosis not present

## 2019-02-25 DIAGNOSIS — Z8049 Family history of malignant neoplasm of other genital organs: Secondary | ICD-10-CM | POA: Diagnosis not present

## 2019-02-25 DIAGNOSIS — Z79899 Other long term (current) drug therapy: Secondary | ICD-10-CM | POA: Diagnosis not present

## 2019-02-25 DIAGNOSIS — K219 Gastro-esophageal reflux disease without esophagitis: Secondary | ICD-10-CM | POA: Diagnosis not present

## 2019-02-25 DIAGNOSIS — I480 Paroxysmal atrial fibrillation: Secondary | ICD-10-CM | POA: Diagnosis not present

## 2019-02-25 DIAGNOSIS — Z8546 Personal history of malignant neoplasm of prostate: Secondary | ICD-10-CM | POA: Diagnosis not present

## 2019-02-25 DIAGNOSIS — C9110 Chronic lymphocytic leukemia of B-cell type not having achieved remission: Secondary | ICD-10-CM | POA: Diagnosis not present

## 2019-02-25 DIAGNOSIS — I10 Essential (primary) hypertension: Secondary | ICD-10-CM | POA: Diagnosis not present

## 2019-02-25 DIAGNOSIS — G43909 Migraine, unspecified, not intractable, without status migrainosus: Secondary | ICD-10-CM | POA: Diagnosis not present

## 2019-02-25 DIAGNOSIS — Y9301 Activity, walking, marching and hiking: Secondary | ICD-10-CM | POA: Diagnosis not present

## 2019-02-25 DIAGNOSIS — Z800 Family history of malignant neoplasm of digestive organs: Secondary | ICD-10-CM | POA: Diagnosis not present

## 2019-02-25 DIAGNOSIS — F410 Panic disorder [episodic paroxysmal anxiety] without agoraphobia: Secondary | ICD-10-CM | POA: Diagnosis not present

## 2019-02-25 DIAGNOSIS — Z952 Presence of prosthetic heart valve: Secondary | ICD-10-CM | POA: Diagnosis not present

## 2019-02-25 DIAGNOSIS — M1990 Unspecified osteoarthritis, unspecified site: Secondary | ICD-10-CM | POA: Diagnosis not present

## 2019-02-25 DIAGNOSIS — Z885 Allergy status to narcotic agent status: Secondary | ICD-10-CM | POA: Diagnosis not present

## 2019-02-25 DIAGNOSIS — M62461 Contracture of muscle, right lower leg: Secondary | ICD-10-CM | POA: Diagnosis not present

## 2019-02-25 DIAGNOSIS — Z9689 Presence of other specified functional implants: Secondary | ICD-10-CM | POA: Diagnosis not present

## 2019-02-25 DIAGNOSIS — M47892 Other spondylosis, cervical region: Secondary | ICD-10-CM | POA: Diagnosis not present

## 2019-02-25 DIAGNOSIS — Z9841 Cataract extraction status, right eye: Secondary | ICD-10-CM | POA: Diagnosis not present

## 2019-02-25 DIAGNOSIS — Z7901 Long term (current) use of anticoagulants: Secondary | ICD-10-CM | POA: Diagnosis not present

## 2019-02-25 DIAGNOSIS — Z9842 Cataract extraction status, left eye: Secondary | ICD-10-CM | POA: Diagnosis not present

## 2019-02-25 DIAGNOSIS — Z87891 Personal history of nicotine dependence: Secondary | ICD-10-CM | POA: Diagnosis not present

## 2019-02-25 DIAGNOSIS — F4321 Adjustment disorder with depressed mood: Secondary | ICD-10-CM | POA: Diagnosis not present

## 2019-02-25 LAB — BASIC METABOLIC PANEL
Anion gap: 9 (ref 5–15)
BUN: 20 mg/dL (ref 8–23)
CO2: 30 mmol/L (ref 22–32)
Calcium: 9.8 mg/dL (ref 8.9–10.3)
Chloride: 102 mmol/L (ref 98–111)
Creatinine, Ser: 1.36 mg/dL — ABNORMAL HIGH (ref 0.61–1.24)
GFR calc Af Amer: 59 mL/min — ABNORMAL LOW (ref >60)
GFR calc non Af Amer: 51 mL/min — ABNORMAL LOW (ref >60)
Glucose, Bld: 102 mg/dL — ABNORMAL HIGH (ref 70–99)
Potassium: 4.5 mmol/L (ref 3.5–5.1)
Sodium: 141 mmol/L (ref 135–145)

## 2019-02-27 ENCOUNTER — Other Ambulatory Visit: Payer: Self-pay | Admitting: Family Medicine

## 2019-03-01 NOTE — Anesthesia Preprocedure Evaluation (Addendum)
Anesthesia Evaluation  Patient identified by MRN, date of birth, ID band Patient awake    Reviewed: Allergy & Precautions, NPO status , Patient's Chart, lab work & pertinent test results  History of Anesthesia Complications Negative for: history of anesthetic complications  Airway Mallampati: II  TM Distance: >3 FB Neck ROM: Full    Dental  (+) Dental Advisory Given, Teeth Intact, Caps,    Pulmonary former smoker, PE (x2, now on [facility] therapy)   breath sounds clear to auscultation       Cardiovascular Exercise Tolerance: Good hypertension, Pt. on medications + dysrhythmias Atrial Fibrillation + Valvular Problems/Murmurs  Rhythm:Regular Rate:Normal   S/p AVR   '19 TTE - mild concentric LVH. EF 60% to 65%. Bioprosthetic stented AVR with stable gradients since 2018 no peri valvular regurgitation. Mild MR. LA moderately dilated.    Neuro/Psych  Headaches, PSYCHIATRIC DISORDERS Anxiety    GI/Hepatic Neg liver ROS, GERD  Controlled,  Endo/Other  negative endocrine ROS  Renal/GU Renal InsufficiencyRenal disease     Musculoskeletal  (+) Arthritis ,   Abdominal   Peds  Hematology  CLL   Anesthesia Other Findings   Reproductive/Obstetrics                           Anesthesia Physical Anesthesia Plan  ASA: III  Anesthesia Plan: General   Post-op Pain Management:    Induction: Intravenous  PONV Risk Score and Plan: 2 and Treatment may vary due to age or medical condition, Ondansetron and Dexamethasone  Airway Management Planned: LMA  Additional Equipment: None  Intra-op Plan:   Post-operative Plan: Extubation in OR  Informed Consent: I have reviewed the patients History and Physical, chart, labs and discussed the procedure including the risks, benefits and alternatives for the proposed anesthesia with the patient or authorized representative who has indicated his/her understanding and  acceptance.     Dental advisory given  Plan Discussed with: CRNA and Anesthesiologist  Anesthesia Plan Comments:        Anesthesia Quick Evaluation

## 2019-03-01 NOTE — Pre-Procedure Instructions (Signed)
    SPOKE W/  _     SCREENING SYMPTOMS OF COVID 19:   COUGH--no  RUNNY NOSE--- no  SORE THROAT---no  NASAL CONGESTION----no  SNEEZING----no  SHORTNESS OF BREATH---no  DIFFICULTY BREATHING---no  TEMP >100.4-----no  UNEXPLAINED BODY ACHES------no   HAVE YOU OR ANY FAMILY MEMBER TRAVELLED PAST 14 DAYS OUT OF THE   COUNTY---yes, lives in [place] STATE----no COUNTRY----no  HAVE YOU OR ANY FAMILY MEMBER BEEN EXPOSED TO ANYONE WITH COVID 19? no

## 2019-03-01 NOTE — Telephone Encounter (Signed)
Pt requesting refill on Lorazepam. Has an appt 6/23 with you.

## 2019-03-02 ENCOUNTER — Encounter (HOSPITAL_BASED_OUTPATIENT_CLINIC_OR_DEPARTMENT_OTHER): Payer: Self-pay

## 2019-03-02 ENCOUNTER — Ambulatory Visit (HOSPITAL_BASED_OUTPATIENT_CLINIC_OR_DEPARTMENT_OTHER)
Admission: RE | Admit: 2019-03-02 | Discharge: 2019-03-02 | Disposition: A | Discharge status: Home / Self Care | Payer: Medicare Other | Attending: Orthopaedic Surgery | Admitting: Orthopaedic Surgery

## 2019-03-02 ENCOUNTER — Ambulatory Visit (HOSPITAL_BASED_OUTPATIENT_CLINIC_OR_DEPARTMENT_OTHER): Payer: Medicare Other | Admitting: Anesthesiology

## 2019-03-02 ENCOUNTER — Encounter (HOSPITAL_BASED_OUTPATIENT_CLINIC_OR_DEPARTMENT_OTHER)
Admission: RE | Disposition: A | Discharge status: Home / Self Care | Payer: Self-pay | Source: Home / Self Care | Attending: Orthopaedic Surgery

## 2019-03-02 ENCOUNTER — Other Ambulatory Visit: Payer: Self-pay

## 2019-03-02 DIAGNOSIS — M1990 Unspecified osteoarthritis, unspecified site: Secondary | ICD-10-CM | POA: Insufficient documentation

## 2019-03-02 DIAGNOSIS — Z7901 Long term (current) use of anticoagulants: Secondary | ICD-10-CM | POA: Insufficient documentation

## 2019-03-02 DIAGNOSIS — S86811A Strain of other muscle(s) and tendon(s) at lower leg level, right leg, initial encounter: Secondary | ICD-10-CM | POA: Diagnosis not present

## 2019-03-02 DIAGNOSIS — Z87891 Personal history of nicotine dependence: Secondary | ICD-10-CM | POA: Insufficient documentation

## 2019-03-02 DIAGNOSIS — Z8042 Family history of malignant neoplasm of prostate: Secondary | ICD-10-CM | POA: Insufficient documentation

## 2019-03-02 DIAGNOSIS — Z9689 Presence of other specified functional implants: Secondary | ICD-10-CM | POA: Insufficient documentation

## 2019-03-02 DIAGNOSIS — Z800 Family history of malignant neoplasm of digestive organs: Secondary | ICD-10-CM | POA: Insufficient documentation

## 2019-03-02 DIAGNOSIS — Z8249 Family history of ischemic heart disease and other diseases of the circulatory system: Secondary | ICD-10-CM | POA: Insufficient documentation

## 2019-03-02 DIAGNOSIS — I480 Paroxysmal atrial fibrillation: Secondary | ICD-10-CM | POA: Insufficient documentation

## 2019-03-02 DIAGNOSIS — Z885 Allergy status to narcotic agent status: Secondary | ICD-10-CM | POA: Insufficient documentation

## 2019-03-02 DIAGNOSIS — I10 Essential (primary) hypertension: Secondary | ICD-10-CM | POA: Insufficient documentation

## 2019-03-02 DIAGNOSIS — Z9842 Cataract extraction status, left eye: Secondary | ICD-10-CM | POA: Insufficient documentation

## 2019-03-02 DIAGNOSIS — Z952 Presence of prosthetic heart valve: Secondary | ICD-10-CM | POA: Insufficient documentation

## 2019-03-02 DIAGNOSIS — K219 Gastro-esophageal reflux disease without esophagitis: Secondary | ICD-10-CM | POA: Insufficient documentation

## 2019-03-02 DIAGNOSIS — E785 Hyperlipidemia, unspecified: Secondary | ICD-10-CM | POA: Insufficient documentation

## 2019-03-02 DIAGNOSIS — Z8546 Personal history of malignant neoplasm of prostate: Secondary | ICD-10-CM | POA: Insufficient documentation

## 2019-03-02 DIAGNOSIS — F419 Anxiety disorder, unspecified: Secondary | ICD-10-CM | POA: Insufficient documentation

## 2019-03-02 DIAGNOSIS — C9110 Chronic lymphocytic leukemia of B-cell type not having achieved remission: Secondary | ICD-10-CM | POA: Diagnosis not present

## 2019-03-02 DIAGNOSIS — M62461 Contracture of muscle, right lower leg: Secondary | ICD-10-CM | POA: Diagnosis not present

## 2019-03-02 DIAGNOSIS — F410 Panic disorder [episodic paroxysmal anxiety] without agoraphobia: Secondary | ICD-10-CM | POA: Insufficient documentation

## 2019-03-02 DIAGNOSIS — M47892 Other spondylosis, cervical region: Secondary | ICD-10-CM | POA: Insufficient documentation

## 2019-03-02 DIAGNOSIS — Z823 Family history of stroke: Secondary | ICD-10-CM | POA: Insufficient documentation

## 2019-03-02 DIAGNOSIS — Z888 Allergy status to other drugs, medicaments and biological substances status: Secondary | ICD-10-CM | POA: Insufficient documentation

## 2019-03-02 DIAGNOSIS — G43909 Migraine, unspecified, not intractable, without status migrainosus: Secondary | ICD-10-CM | POA: Insufficient documentation

## 2019-03-02 DIAGNOSIS — Z9841 Cataract extraction status, right eye: Secondary | ICD-10-CM | POA: Insufficient documentation

## 2019-03-02 DIAGNOSIS — Z8049 Family history of malignant neoplasm of other genital organs: Secondary | ICD-10-CM | POA: Insufficient documentation

## 2019-03-02 DIAGNOSIS — I340 Nonrheumatic mitral (valve) insufficiency: Secondary | ICD-10-CM | POA: Insufficient documentation

## 2019-03-02 DIAGNOSIS — Y9301 Activity, walking, marching and hiking: Secondary | ICD-10-CM | POA: Insufficient documentation

## 2019-03-02 DIAGNOSIS — Z79899 Other long term (current) drug therapy: Secondary | ICD-10-CM | POA: Insufficient documentation

## 2019-03-02 HISTORY — PX: TIBIALIS TENDON TRANSFER / REPAIR: SHX6630

## 2019-03-02 SURGERY — TRANSFER, TENDON, TIBIA
Anesthesia: General | Site: Leg Lower | Laterality: Right

## 2019-03-02 MED ORDER — ONDANSETRON HCL 4 MG/2ML IJ SOLN
INTRAMUSCULAR | Status: AC
  Filled 2019-03-02: qty 2

## 2019-03-02 MED ORDER — DEXAMETHASONE SODIUM PHOSPHATE 10 MG/ML IJ SOLN
INTRAMUSCULAR | Status: DC | PRN
  Administered 2019-03-02: 5 mg via INTRAVENOUS

## 2019-03-02 MED ORDER — FENTANYL CITRATE (PF) 100 MCG/2ML IJ SOLN
50.0000 ug | INTRAMUSCULAR | Status: AC | PRN
  Administered 2019-03-02: 25 ug via INTRAVENOUS
  Administered 2019-03-02 (×2): 50 ug via INTRAVENOUS
  Administered 2019-03-02: 25 ug via INTRAVENOUS

## 2019-03-02 MED ORDER — PROPOFOL 10 MG/ML IV BOLUS
INTRAVENOUS | Status: AC
  Filled 2019-03-02: qty 40

## 2019-03-02 MED ORDER — LACTATED RINGERS IV SOLN
INTRAVENOUS | Status: DC
  Administered 2019-03-02: 07:00:00 via INTRAVENOUS

## 2019-03-02 MED ORDER — ONDANSETRON HCL 4 MG/2ML IJ SOLN
INTRAMUSCULAR | Status: DC | PRN
  Administered 2019-03-02: 4 mg via INTRAVENOUS

## 2019-03-02 MED ORDER — MIDAZOLAM HCL 2 MG/2ML IJ SOLN
1.0000 mg | INTRAMUSCULAR | Status: DC | PRN
  Administered 2019-03-02: 2 mg via INTRAVENOUS
  Administered 2019-03-02: 1 mg via INTRAVENOUS

## 2019-03-02 MED ORDER — PROPOFOL 10 MG/ML IV BOLUS
INTRAVENOUS | Status: DC | PRN
  Administered 2019-03-02: 160 mg via INTRAVENOUS

## 2019-03-02 MED ORDER — ACETAMINOPHEN 500 MG PO TABS: 1000.0000 mg | ORAL_TABLET | Freq: Four times a day (QID) | ORAL | 2 refills | Status: AC | PRN

## 2019-03-02 MED ORDER — MIDAZOLAM HCL 2 MG/2ML IJ SOLN
INTRAMUSCULAR | Status: AC
  Filled 2019-03-02: qty 2

## 2019-03-02 MED ORDER — BUPIVACAINE-EPINEPHRINE (PF) 0.5% -1:200000 IJ SOLN
INTRAMUSCULAR | Status: DC | PRN
  Administered 2019-03-02: 15 mL via PERINEURAL
  Administered 2019-03-02: 25 mL via PERINEURAL

## 2019-03-02 MED ORDER — BUPIVACAINE HCL 0.5 % IJ SOLN
INTRAMUSCULAR | Status: DC | PRN
  Administered 2019-03-02: 20 mL

## 2019-03-02 MED ORDER — BUPIVACAINE HCL (PF) 0.5 % IJ SOLN
INTRAMUSCULAR | Status: AC
  Filled 2019-03-02: qty 30

## 2019-03-02 MED ORDER — DEXAMETHASONE SODIUM PHOSPHATE 10 MG/ML IJ SOLN
INTRAMUSCULAR | Status: AC
  Filled 2019-03-02: qty 1

## 2019-03-02 MED ORDER — ARTIFICIAL TEARS OPHTHALMIC OINT
TOPICAL_OINTMENT | OPHTHALMIC | Status: AC
  Filled 2019-03-02: qty 3.5

## 2019-03-02 MED ORDER — ACETAMINOPHEN 325 MG PO TABS: 325.0000 mg | ORAL_TABLET | ORAL | Status: DC | PRN

## 2019-03-02 MED ORDER — FENTANYL CITRATE (PF) 100 MCG/2ML IJ SOLN: 25.0000 ug | INTRAMUSCULAR | Status: DC | PRN

## 2019-03-02 MED ORDER — KETOROLAC TROMETHAMINE 30 MG/ML IJ SOLN
INTRAMUSCULAR | Status: AC
  Filled 2019-03-02: qty 1

## 2019-03-02 MED ORDER — LIDOCAINE 2% (20 MG/ML) 5 ML SYRINGE
INTRAMUSCULAR | Status: AC
  Filled 2019-03-02: qty 5

## 2019-03-02 MED ORDER — ONDANSETRON HCL 4 MG/2ML IJ SOLN: 4.0000 mg | Freq: Once | INTRAMUSCULAR | Status: DC | PRN

## 2019-03-02 MED ORDER — CEFAZOLIN SODIUM-DEXTROSE 2-4 GM/100ML-% IV SOLN
2.0000 g | INTRAVENOUS | Status: AC
  Administered 2019-03-02: 2 g via INTRAVENOUS

## 2019-03-02 MED ORDER — CHLORHEXIDINE GLUCONATE 4 % EX LIQD: 60.0000 mL | Freq: Once | CUTANEOUS | Status: DC

## 2019-03-02 MED ORDER — LIDOCAINE 2% (20 MG/ML) 5 ML SYRINGE
INTRAMUSCULAR | Status: DC | PRN
  Administered 2019-03-02: 100 mg via INTRAVENOUS

## 2019-03-02 MED ORDER — CEFAZOLIN SODIUM-DEXTROSE 2-4 GM/100ML-% IV SOLN
INTRAVENOUS | Status: AC
  Filled 2019-03-02: qty 100

## 2019-03-02 MED ORDER — TIZANIDINE HCL 2 MG PO TABS: 2.0000 mg | ORAL_TABLET | Freq: Four times a day (QID) | ORAL | 0 refills | Status: DC | PRN

## 2019-03-02 MED ORDER — ACETAMINOPHEN 160 MG/5ML PO SOLN: 325.0000 mg | ORAL | Status: DC | PRN

## 2019-03-02 MED ORDER — SCOPOLAMINE 1 MG/3DAYS TD PT72: 1.0000 | MEDICATED_PATCH | Freq: Once | TRANSDERMAL | Status: DC | PRN

## 2019-03-02 MED ORDER — FENTANYL CITRATE (PF) 100 MCG/2ML IJ SOLN
INTRAMUSCULAR | Status: AC
  Filled 2019-03-02: qty 2

## 2019-03-02 MED ORDER — GABAPENTIN 300 MG PO CAPS: 300.0000 mg | ORAL_CAPSULE | Freq: Three times a day (TID) | ORAL | 2 refills | Status: DC

## 2019-03-02 MED ORDER — FENTANYL CITRATE (PF) 100 MCG/2ML IJ SOLN
INTRAMUSCULAR | Status: AC
  Filled 2019-03-02: qty 4

## 2019-03-02 SURGICAL SUPPLY — 74 items
ADH SKN CLS APL DERMABOND .7 (GAUZE/BANDAGES/DRESSINGS) ×2
APL PRP STRL LF DISP 70% ISPRP (MISCELLANEOUS) ×2
APL SKNCLS STERI-STRIP NONHPOA (GAUZE/BANDAGES/DRESSINGS)
BANDAGE ACE 4X5 VEL STRL LF (GAUZE/BANDAGES/DRESSINGS) ×3 IMPLANT
BANDAGE ACE 6X5 VEL STRL LF (GAUZE/BANDAGES/DRESSINGS) IMPLANT
BANDAGE ESMARK 6X9 LF (GAUZE/BANDAGES/DRESSINGS) ×2 IMPLANT
BENZOIN TINCTURE PRP APPL 2/3 (GAUZE/BANDAGES/DRESSINGS) IMPLANT
BLADE SURG 15 STRL LF DISP TIS (BLADE) ×9 IMPLANT
BLADE SURG 15 STRL SS (BLADE) ×21
BNDG CMPR 9X6 STRL LF SNTH (GAUZE/BANDAGES/DRESSINGS) ×2
BNDG ESMARK 6X9 LF (GAUZE/BANDAGES/DRESSINGS) ×3
CHLORAPREP W/TINT 26 (MISCELLANEOUS) ×3 IMPLANT
COVER BACK TABLE 60X90IN (DRAPES) ×2 IMPLANT
COVER BACK TABLE REUSABLE LG (DRAPES) ×3 IMPLANT
COVER MAYO STAND STRL (DRAPES) ×2 IMPLANT
COVER WAND RF STERILE (DRAPES) IMPLANT
CUFF TOURN SGL QUICK 34 (TOURNIQUET CUFF) ×3
CUFF TRNQT CYL 34X4.125X (TOURNIQUET CUFF) ×2 IMPLANT
DECANTER SPIKE VIAL GLASS SM (MISCELLANEOUS) IMPLANT
DERMABOND ADVANCED (GAUZE/BANDAGES/DRESSINGS) ×1
DERMABOND ADVANCED .7 DNX12 (GAUZE/BANDAGES/DRESSINGS) ×2 IMPLANT
DRAPE EXTREMITY T 121X128X90 (DISPOSABLE) ×3 IMPLANT
DRAPE IMP U-DRAPE 54X76 (DRAPES) ×3 IMPLANT
DRAPE U-SHAPE 47X51 STRL (DRAPES) ×3 IMPLANT
ELECT REM PT RETURN 9FT ADLT (ELECTROSURGICAL) ×3
ELECTRODE REM PT RTRN 9FT ADLT (ELECTROSURGICAL) ×2 IMPLANT
FHL IMPLANT SYSTEM 6.25 (Anchor) ×3 IMPLANT
GAUZE SPONGE 4X4 12PLY STRL (GAUZE/BANDAGES/DRESSINGS) ×3 IMPLANT
GAUZE XEROFORM 1X8 LF (GAUZE/BANDAGES/DRESSINGS) ×3 IMPLANT
GAUZE XEROFORM 5X9 LF (GAUZE/BANDAGES/DRESSINGS) ×2 IMPLANT
GLOVE BIO SURGEON STRL SZ7 (GLOVE) ×4 IMPLANT
GLOVE BIO SURGEON STRL SZ7.5 (GLOVE) ×5 IMPLANT
GLOVE BIOGEL PI IND STRL 7.0 (GLOVE) ×1 IMPLANT
GLOVE BIOGEL PI IND STRL 7.5 (GLOVE) ×1 IMPLANT
GLOVE BIOGEL PI IND STRL 8 (GLOVE) ×2 IMPLANT
GLOVE BIOGEL PI INDICATOR 7.0 (GLOVE) ×1
GLOVE BIOGEL PI INDICATOR 7.5 (GLOVE) ×1
GLOVE BIOGEL PI INDICATOR 8 (GLOVE) ×1
GOWN STRL REUS W/ TWL LRG LVL3 (GOWN DISPOSABLE) ×2 IMPLANT
GOWN STRL REUS W/ TWL XL LVL3 (GOWN DISPOSABLE) ×1 IMPLANT
GOWN STRL REUS W/TWL LRG LVL3 (GOWN DISPOSABLE) ×3
GOWN STRL REUS W/TWL XL LVL3 (GOWN DISPOSABLE)
GRAFT TISS ANT TIB TNDN (Tissue) IMPLANT
NS IRRIG 1000ML POUR BTL (IV SOLUTION) ×5 IMPLANT
PACK BASIN DAY SURGERY FS (CUSTOM PROCEDURE TRAY) ×3 IMPLANT
PAD CAST 4YDX4 CTTN HI CHSV (CAST SUPPLIES) ×2 IMPLANT
PADDING CAST COTTON 4X4 STRL (CAST SUPPLIES) ×3
PADDING CAST SYNTHETIC 4 (CAST SUPPLIES) ×4
PADDING CAST SYNTHETIC 4X4 STR (CAST SUPPLIES) ×5 IMPLANT
PENCIL BUTTON HOLSTER BLD 10FT (ELECTRODE) ×3 IMPLANT
SHEET MEDIUM DRAPE 40X70 STRL (DRAPES) ×2 IMPLANT
SLEEVE SCD COMPRESS KNEE MED (MISCELLANEOUS) ×3 IMPLANT
SPLINT FIBERGLASS 4X30 (CAST SUPPLIES) ×1 IMPLANT
SPONGE LAP 18X18 RF (DISPOSABLE) IMPLANT
STOCKINETTE 6  STRL (DRAPES) ×1
STOCKINETTE 6 STRL (DRAPES) ×2 IMPLANT
STRIP CLOSURE SKIN 1/2X4 (GAUZE/BANDAGES/DRESSINGS) IMPLANT
SUCTION FRAZIER HANDLE 10FR (MISCELLANEOUS) ×1
SUCTION TUBE FRAZIER 10FR DISP (MISCELLANEOUS) ×2 IMPLANT
SUT ETHILON 3 0 PS 1 (SUTURE) ×6 IMPLANT
SUT FIBERWIRE #2 38 T-5 BLUE (SUTURE) ×3
SUT MNCRL AB 3-0 PS2 18 (SUTURE) ×9 IMPLANT
SUT PDS AB 0 CT 36 (SUTURE) ×4 IMPLANT
SUT PDS AB 2-0 CT2 27 (SUTURE) ×2 IMPLANT
SUT VIC AB 3-0 FS2 27 (SUTURE) IMPLANT
SUTURE FIBERWR #2 38 T-5 BLUE (SUTURE) ×1 IMPLANT
SUTURE TAPE 1.3 FIBERLOP 20 ST (SUTURE) ×1 IMPLANT
SUTURETAPE 1.3 FIBERLOOP 20 ST (SUTURE) ×3
SYR BULB 3OZ (MISCELLANEOUS) ×3 IMPLANT
SYSTEM IMPLANT FHL 6.25 (Anchor) ×1 IMPLANT
TENDON ANTERIOR TIBIALIS (Tissue) ×3 IMPLANT
TOWEL GREEN STERILE FF (TOWEL DISPOSABLE) ×6 IMPLANT
TUBE CONNECTING 20X1/4 (TUBING) ×3 IMPLANT
UNDERPAD 30X30 (UNDERPADS AND DIAPERS) ×3 IMPLANT

## 2019-03-02 NOTE — H&P (Signed)
[name] is an 73 y.o. male.   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 HPI: Developed a foot drop about 1 month ago.  He was seen by an outside provider where there was thought this was neurologic in origin and work-up for radiculopathy was performed.  Ultimately he was sent to me for evaluation given identification of the rupture of the tibialis anterior tendon.  He is been wearing an AFO brace but is dissatisfied with his level of function.  He is very active with martial arts and hiking and has been unable to resume this after his injury.  He is here today for repair versus reconstruction of the tibialis anterior tendon on the right side.  He continues to have weakness with ankle dorsiflexion.  Has pain in the anterior aspect of his ankle.  Denies any other joint or extremity pain today.  He has stopped his Xarelto prior to surgery.  Past Medical History:  Diagnosis Date  . Anticoagulants causing adverse effect in therapeutic use   . Anxiety    Paxil seemed to cause odd neuro side effects; better on prozac as of 09/2015  . Aortic regurgitation 04/2007 surgery   Resolved with tissue AVR at [facility]  . Cervical spondylosis    ESI by [name] in Ortho. 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 Chronic lymphocytic leukemia (CLL), B-cell ([place]) approx 2012   stable on f/u's with [name] (most recent 07/2017)  . History of prostate cancer 2003   Rad prost  . History of pulmonary embolism 2011; 10/2014   Recurrence off of anticoagulation 10/2014: per hem/onc ([name]) pt needs lifelong anticoagulation (hypercoag w/u neg).  . Hyperlipidemia    statin-intolerant, except for crestor low dose.  Marland Kitchen Hypertension   . Migraine syndrome   . Mitral regurgitation   . PAF (paroxysmal atrial fibrillation) ([place])   . Panic attacks    "           "             "                  "                  "                       "                              "                         . Pulmonary nodule, right 2015   RLL: resected at [facility] --VATS; Benign RLL nodule (fungal and AFB stains neg).  Plan is for [facility] thoracic surg to do f/u o/v & CT chest 1 yr   . Right-sided headache 07/2016   Primary stabbing HA or migraine per neuro (Dr. Tomi Likens).  Gabapentin and topamax not tolerated.  These resolved spontaneously.    Past Surgical History:  Procedure Laterality Date  . AORTIC VALVE REPLACEMENT  2011   [facility]  (bioprosthetic)  . CARDIAC CATHETERIZATION  04/2010   Normal coronaries.  . Carotid dopplers  08/2015   NORMAL  . CATARACT EXTRACTION     L 12/06/09   R 09/14/09  . COLONOSCOPY  01/26/13   tic's,  o/w normal.  ([facility])--recall 10 yrs.  Marland Kitchen PENILE PROSTHESIS IMPLANT    . PROSTATECTOMY  04/2002   for prostate cancer  . Pulmonary nodule resection  Fall/winter 2016   [facility]  . SHOULDER SURGERY     rotator cuff on both arms   . TRANSTHORACIC ECHOCARDIOGRAM  09/20/2014; 03/2016; 03/2017   2015-mild LVH, EF 50-55%, wall motion nl, grade I diast dysfxn, prosth aort valve good,transaortic gradients decreased compared to echo 11/2013.   03/2016: EF 55-60%, normal LV function, severe LVH, normal diast fxn, mild increase in AV gradient compared to 09/2014 echo.  2018: EF 55-60%, normal LV fxn, grd II DD, AV stable/gradient's stable.    Family History  Problem Relation Age of Onset  . Cancer Sister        uterine  . Colon cancer Sister 79  . Stroke Mother   . Prostate cancer Father   . Stroke Sister   . Stroke Brother   . Heart attack Neg Hx    Social History:  reports that he quit smoking about 38 years ago. His smoking use included cigarettes. He started smoking about 50 years ago. He has a 12.00 pack-year smoking history. He has never used smokeless tobacco. He reports current alcohol use. He reports that he does not use drugs.  Allergies:  Allergies  Allergen Reactions  . Hydrocodone-Acetaminophen Shortness Of Breath  . Oxycodone Hcl  Shortness Of Breath  . Telmisartan-Hctz Shortness Of Breath and Palpitations    Dizziness, too  . Ramipril   . Aspirin Other (See Comments) and Rash    Other Reaction: confusion Can't take high dose  . Atorvastatin Other (See Comments)    myalgia  . Buspirone Hcl Other (See Comments)    headache  . Codeine Nausea Only and Other (See Comments)    Other Reaction: confusion  . Codeine Phosphate Itching and Nausea Only  . Morphine Sulfate Itching  . Paroxetine Other (See Comments)    Paresthesias: R arm, R leg, r side of face  . Rabeprazole Other (See Comments)    headache    Medications Prior to Admission  Medication Sig Dispense Refill  . cholecalciferol (VITAMIN D) 1000 units tablet Take 1,000 Units by mouth daily.    . hydrochlorothiazide (HYDRODIURIL) 12.5 MG tablet Take 1 tablet (12.5 mg total) by mouth daily. 90 tablet 1  . LORazepam (ATIVAN) 0.5 MG tablet TAKE 1 TABLET BY MOUTH EVERY DAY AS NEEDED FOR ANXIETY 30 tablet 2  . meclizine (ANTIVERT) 12.5 MG tablet TAKE 1 TABLET BY MOUTH 3 TIMES A DAY AS NEEDED FOR DIZZINESS 30 tablet 3  . rivaroxaban (XARELTO) 20 MG TABS tablet Take 1 tablet (20 mg total) by mouth daily with supper. 90 tablet 3  . rosuvastatin (CRESTOR) 10 MG tablet TAKE 1 TABLET BY MOUTH EVERY DAY 90 tablet 3  . sertraline (ZOLOFT) 100 MG tablet Take 100 mg by mouth daily.      No results found for this or any previous visit (from the past 48 hour(s)). No results found.  Review of Systems  Constitutional: Negative.   HENT: Negative.   Eyes: Negative.   Respiratory: Negative.   Cardiovascular: Negative.   Gastrointestinal: Negative.   Musculoskeletal:       Right ankle pain and weakness  Skin: Negative.   Neurological: Negative.   Psychiatric/Behavioral: Negative.     Blood pressure 139/86, pulse 68, temperature 98.7 F (37.1 C), temperature source Oral, resp. rate 16, height 5\' 10"  (1.778 m), weight 93.6  kg. Physical Exam  Constitutional: He  appears well-developed.  HENT:  Head: Normocephalic.  Eyes: Conjunctivae are normal.  Neck: Neck supple.  Cardiovascular: Normal rate.  Respiratory: Effort normal.  GI: Soft.  Musculoskeletal:     Comments: Right ankle demonstrates some swelling in the anterior ankle.  He has tenderness to palpation the anterior ankle and palpable stump of tibialis anterior tendon.  He is unable to dorsiflex the ankle.  Patient has a gastroc contracture with about 5 degrees less than neutral dorsiflexion at the ankle.  He does have pes planovalgus.  Patient endorses intact sensation on the dorsal and plantar foot.  Foot is warm and well-perfused with palpable dorsalis pedis pulse.  Patient has intact hindfoot inversion and eversion actively.  Neurological: He is alert.  Skin: Skin is warm.  Psychiatric: He has a normal mood and affect.     Assessment/Plan We will proceed with planned repair versus reconstruction of his tibialis anterior tendon on the right ankle.  He does have pes planovalgus and a gastroc contracture.  Given this we will likely need to perform a gastroc recession.  It was discussed that in the operating room we will perform a better Silverskiold test to determine the severity of his equinus contracture.  This was discussed with him this morning.  He understands the risk, benefits and alternatives to the surgery which included but are not limited to wound healing complications, infection, damage to surrounding structures, need for further surgery, weakness, stiffness and less than optimal outcome.  We also discussed the perioperative and anesthetic risk which included death.  He understands the postoperative weightbearing restrictions and agrees to comply.  Erle Crocker, MD 03/02/2019, 7:05 AM

## 2019-03-02 NOTE — Transfer of Care (Signed)
Immediate Anesthesia Transfer of Care Note  Patient: [name]  Procedure(s) Performed: RECONSTRUCTION OF RIGHT  ANTERIOR TIBIALIS TENDON (Right Leg Lower)  Patient Location: PACU  Anesthesia Type:General  Level of Consciousness: awake, alert , oriented and patient cooperative  Airway & Oxygen Therapy: Patient Spontanous Breathing and Patient connected to nasal cannula oxygen  Post-op Assessment: Report given to RN and Post -op Vital signs reviewed and stable  Post vital signs: Reviewed and stable  Last Vitals:  Vitals Value Taken Time  BP 140/72 03/02/2019 11:03 AM  Temp    Pulse 74 03/02/2019 11:04 AM  Resp 16 03/02/2019 11:04 AM  SpO2 100 % 03/02/2019 11:04 AM  Vitals shown include unvalidated device data.  Last Pain:  Vitals:   03/02/19 0635  TempSrc: Oral  PainSc: 0-No pain         Complications: No apparent anesthesia complications

## 2019-03-02 NOTE — Anesthesia Procedure Notes (Signed)
Anesthesia Regional Block: Popliteal block   Pre-Anesthetic Checklist: ,, timeout performed, Correct Patient, Correct Site, Correct Laterality, Correct Procedure, Correct Position, site marked, Risks and benefits discussed,  Surgical consent,  Pre-op evaluation,  At surgeon's request and post-op pain management  Laterality: Right  Prep: chloraprep       Needles:  Injection technique: Single-shot  Needle Type: Echogenic Needle     Needle Length: 10cm  Needle Gauge: 21     Additional Needles:   Narrative:  Start time: 03/02/2019 7:30 AM End time: 03/02/2019 7:33 AM Injection made incrementally with aspirations every 5 mL.  Performed by: Personally  Anesthesiologist: Audry Pili, MD  Additional Notes: No pain on injection. No increased resistance to injection. Injection made in 5cc increments. Good needle visualization. Patient tolerated the procedure well.

## 2019-03-02 NOTE — Op Note (Signed)
[name] male 73 y.o. 03/02/2019  PreOperative Diagnosis: Chronic right tibialis anterior rupture Gastrocnemius contracture  PostOperative Diagnosis: Same  PROCEDURE: Right tibialis anterior tendon reconstruction with allograft tendon Right gastrocnemius tendon recession  SURGEON: Melony Overly, MD  ASSISTANT: None  ANESTHESIA:  General LMA anesthesia with popliteal nerve block Half percent Marcaine local anesthesia x20 cc  FINDINGS: Proximal right tibialis anterior rupture with evidence of chronic tendinosis Gastrocnemius contracture with positive Silverskiold test  IMPLANTS: Arthrex 6.25 mm Bio-Tenodesis screw Arthrex suture button Tibialis anterior tendon allograft  INDICATIONS:73 y.o. 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  He states he was hiking and noted difficulty dorsiflexing his ankle.  He was seen by an outside provider where there was concern for nerve issues given no history of traumatic injury.  He was worked up for a lumbar radiculopathy which was negative.  Subsequent MRI of his ankle was performed but the radiologist did not read any tibialis anterior tendon rupture.  Later on by an outside provider a nodule was noted in the anterior aspect of his ankle and ultrasound was performed identifying the tibialis anterior rupture proximal to the ankle joint.  He was sent to me greater than 4 weeks out from his injury with continued foot drop.  He had been wearing an AFO brace but was dissatisfied with his level of function given his high activity levels.  I reviewed the MRI scan which showed a proximal tibialis anterior rupture.  Exam initially and prior to surgery demonstrated a gastrocnemius contracture and positive Silverskiold test.  We discussed continued conservative treatment versus surgical repair versus reconstruction.  He opted  for surgical repair.  He understood the risk, benefits and alternatives which were discussed with him.  These included but were not limited to wound healing complications, infection, weakness, pain, demonstrating structures, need for further surgery.  We also discussed the perioperative and anesthetic risk which include death.  We also discussed the perioperative weightbearing and rehab restrictions which she agreed to comply to.  Prior to surgery he underwent clearance by his cardiologist and primary care physician.  He stopped his Xarelto prior to surgery.  PROCEDURE: Patient was identified in the preoperative holding area.  Consent was signed by myself and the patient.  Operative extremity was signed by my self.  Peripheral nerve block was performed by anesthesia.  He was taken to the operative suite and placed supine on the operative table.  General LMA anesthesia was induced out difficulty.  Thigh tourniquet was placed on the right thigh.  Bone foam was used.  All bony prominences were well-padded.  The right hip was bumped up slightly.  Presurgical antibiotics were given.  Timeout was performed.  The right lower extremity was prepped and draped in the usual sterile fashion.  The right lower extremity was elevated and the tourniquet was elevated to 250 mmHg.  I began by performing a repeat Silverskiold test under anesthesia.  This was positive.  He had a 5 degrees equinus contracture with the knee straight and I was able to get him to 5 degrees past neutral dorsiflexion with the knee bent.  I then proceeded to make a 3 cm longitudinal incision on the medial aspect of the posterior calf.  This was at the level of the musculotendinous junction of the gastroc.  This was taken sharply down through skin and subcutaneous  tissue and Bovie cautery was used for hemostasis.  I then identified the fascia overlying the gastroc muscle and tendon which was incised longitudinally.  Then these fascial flaps were elevated and  the tendinous portion of the gastroc as it meets with the soleus for the conjoined tendon was identified.  There is also a plantaris tendon in this area.  The tendon of the gastroc was elevated from the tendon of the soleus muscle.  Both posterior and anterior section of the tendon was completely freed from the surrounding tissue and then the tendon of the gastroc muscle was transected sharply with a 15 blade.  Then repeat dorsiflexion testing was performed and noted that the plantaris tendon was quite tight and therefore this was transected as well.  Then I noted I was able to get to just shy of 5 degrees past neutral dorsiflexion with the knee straight.  Then the fascial tissue overlying the gastroc was closed with a 2-0 PDS suture.  The subcuticular tissue was closed with 3-0 Monocryl and the skin with 3-0 nylon.  Then turned our attention to the anterior ankle.  An incision was made overlying the tibialis anterior from the ankle joint level down to the insertion at the medial cuneiform.  This was taken sharply down through skin and subcutaneous tissue.  Bovie cautery was used for hemostasis.  Dissecting scissors was used to identify the sheath overlying the tendon.  There was evidence of tendon thickening and tendinosis in this area.  The sheath was incised up to the level of the extensor retinaculum.  The tendon underlying the sheath was tendon attic and thin and appeared stretched out.  There was no evidence of identifiable rupture in this area but the tendon was in poor condition.  Then the incision was extended proximally past the level of the extensor retinaculum.  This was opened up and the tendon and sheath were identified and the sheath incised.  The tendon was further inspected and there was a large areas of tendinosis and scarring within the tendon underneath the extensor retinaculum.  Then the incision was extended even more proximally and the rupture was identified proximal to the extensor  retinaculum.  This was just distal to the musculotendinous junction with approximately 3 cm of intact healthy-appearing tendon.  The healthy-appearing tendon was debrided back sharply with a 15 blade and dissecting scissors from surrounding fibrinous tissue that had scarred in there.  During tendon exposure it was noted that there was significant amount of adhesions about the tibialis anterior tendon and it was difficult to get excursion of the muscle belly.  Using a Valora Corporal and an elevator circumferentially around the tendon Tina lysis was performed.  This was performed up to the level of the musculotendinous junction.  Then excursion of the muscle and tendon was allowed.  The ruptured portion of the tendon was then transected just proximal to the rupture at the level of healthy-appearing tendon.  The remaining distal aspect of the tendon was retracted.  Then using a tendon stretcher the tibialis anterior tendon graft was stretched.  Once the tendon was adequately stretched using a tendon passer a Pulvertaft weave was performed in the proximal stump of the ruptured tendon using 3 passes.  This area of tendon allograft was sutured using #2 FiberWire stitch in a running horizontal mattress fashion.  There was excellent tendon contact and tension.  Then 10 maximal tension was pulled on the tendon and tibialis anterior muscle to allow for some stretching.  This  was done for several minutes.  We then closed the extensor retinaculum over the allograft tendon.  The portion of tendon that was tenodesed to the allograft was proximal to the extensor retinaculum and there did not appear to be any restriction to motion there.  The retinaculum was closed with a 0 PDS suture.  Then the medial cuneiform was identified using fluoroscopy and appropriate starting point for the Bio-Tenodesis screw was identified.  The periosteal tissue and soft tissue was elevated dorsally and medially overlying the cuneiform.  A guidewire was placed  into the cuneiform and appropriate position confirmed on intraoperative fluoroscopy.  Then using the drill for the Bio-Tenodesis screw unicortical drill hole was created approximately 1.5 cm into the cuneiform dorsal to plantar.  Then the drill and guidepin were removed.  Then maximal tension was pulled on the allograft and tibialis anterior muscle and the tendon was marked at the insertion site of the Bio-Tenodesis screw on the medial cuneiform.  Then we measured the distal 1.5 cm and using a fiber loop the tendon was sutured.  Then the distal aspect of the tendon was removed.  Then on the end of the fiber loop suture button was placed.  This was then inserted through the drill tunnel in the medial cuneiform through the plantar cortex and allowed to flip back and set.  Then the tendon was advanced into the drill tunnel.  This was done under maximal tension with the foot and maximal dorsiflexion.  Then the Bio-Tenodesis screw was placed without difficulty.  Then the suture that was within the Bio-Tenodesis screw was oversewn to the tendon.  Then using 2-0 PDS suture the overlying periosteal tissue and distal aspect of the native tibialis anterior tendon was repaired back to the allograft tendon to reinforce the reconstruction.  The distal stump was repaired using a 0 PDS.  Then the foot was released and found to be under good tension.  At this point the retinacular tissue was reinforced with PDS suture.  The wound was then irrigated copiously.  The tourniquet was released.  Hemostasis was obtained.  The deep tissue was closed using a 2-0 PDS suture.  The subcuticular tissue was closed with a 3-0 Monocryl and the skin with a 3-0 nylon.  Then 20 cc of half percent Marcaine were injected in and about the 2 incisions for augmenting the nerve block.  The patient was placed in a soft dressing of Xeroform, 4 x 4's and sterile she cotton.  He was then placed in a short leg nonweightbearing cast in maximal  dorsiflexion.  The counts were correct at the end of the case.  There were no complications.  He tolerated the procedure well.  He was awakened from anesthesia and taken recovery in stable condition.  POST OPERATIVE INSTRUCTIONS: Nonweightbearing to right lower extremity Keep cast dry and limb elevated Resume Xarelto starting tomorrow Call the office with concerns Patient will return to clinic in 2 weeks for cast change.  He will be in a cast a minimum of 4 weeks.  He will likely begin physical therapy at the 6-week mark.  TOURNIQUET TIME: 2 hours and 15 minutes  BLOOD LOSS:  less than 50 mL         DRAINS: none         SPECIMEN: none       COMPLICATIONS:  * No complications entered in OR log *         Disposition: PACU - hemodynamically stable.  Condition: stable

## 2019-03-02 NOTE — Anesthesia Postprocedure Evaluation (Signed)
Anesthesia Post Note  Patient: [name]  Procedure(s) Performed: RECONSTRUCTION OF RIGHT  ANTERIOR TIBIALIS TENDON (Right Leg Lower)     Patient location during evaluation: PACU Anesthesia Type: General Level of consciousness: awake and alert Pain management: pain level controlled Vital Signs Assessment: post-procedure vital signs reviewed and stable Respiratory status: spontaneous breathing, nonlabored ventilation and respiratory function stable Cardiovascular status: blood pressure returned to baseline and stable Postop Assessment: no apparent nausea or vomiting Anesthetic complications: no    Last Vitals:  Vitals:   03/02/19 1115 03/02/19 1130  BP: 126/70 130/79  Pulse: 77 75  Resp: 18 13  Temp:    SpO2: 100% 98%    Last Pain:  Vitals:   03/02/19 1104  TempSrc:   PainSc: [place]

## 2019-03-02 NOTE — Anesthesia Procedure Notes (Signed)
Procedure Name: LMA Insertion Date/Time: 03/02/2019 7:48 AM Performed by: Wanita Chamberlain, CRNA Pre-anesthesia Checklist: Patient identified, Emergency Drugs available, Suction available and Patient being monitored Patient Re-evaluated:Patient Re-evaluated prior to induction Oxygen Delivery Method: Circle system utilized Induction Type: IV induction Ventilation: Mask ventilation without difficulty LMA: LMA inserted LMA Size: 5.0 Number of attempts: 1 Airway Equipment and Method: Bite block (gauze bite block b/t central incisors) Placement Confirmation: breath sounds checked- equal and bilateral,  CO2 detector and positive ETCO2 Tube secured with: Tape Dental Injury: Teeth and Oropharynx as per pre-operative assessment

## 2019-03-02 NOTE — Discharge Instructions (Signed)
DR. [facility]GERY POST-OP INSTRUCTIONS   Pain Management 1. The numbing medicine and your leg will last around 8 hours, take a dose of your pain medicine as soon as you feel it wearing off to avoid rebound pain. 2. Keep your foot elevated above heart level.  Make sure that your heel hangs free ('floats'). 3. Take all prescribed medication as directed. 4. If taking narcotic pain medication you may want to use an over-the-counter stool softener to avoid constipation.  Activity ? Non-weightbearing ? Postoperatively, you will be placed into a splint which stays on for 2 weeks and then will be changed at your first postop visit.  First Postoperative Visit 1. Your first postop visit will be at least 2 weeks after surgery.  This should be scheduled when you schedule surgery. 2. If you do not have a postoperative visit scheduled please call [phone] to schedule an appointment. 3. At the appointment your incision will be evaluated for suture removal, x-rays will be obtained if necessary.  General Instructions 1. Swelling is very common after foot and ankle surgery.  It often takes 3 months for the foot and ankle to begin to feel comfortable.  Some amount of swelling will persist for 6-12 months. 2. DO NOT change the dressing.  If there is a problem with the dressing (too tight, loose, gets wet, etc.) please contact Dr. Pollie Friar office. 3. DO NOT get the dressing wet.  For showers you can use an over-the-counter cast cover or wrap a washcloth around the top of your dressing and then cover it with a plastic bag and tape it to your leg. 4. DO NOT soak the incision (no tubs, pools, bath, etc.) until you have approval from Dr. Lucia Gaskins.  Contact Dr. Huel Cote office or go to Emergency Room if: 1. Temperature above 101 F. 2. Increasing pain that is unresponsive to pain medication or elevation 3. Excessive redness or swelling in your foot 4. Dressing problems - excessive bloody drainage,  looseness or tightness, or if dressing gets wet 5. Develop pain, swelling, warmth, or discoloration of your calf   Post Anesthesia Home Care Instructions  Activity: Get plenty of rest for the remainder of the day. A responsible individual must stay with you for 24 hours following the procedure.  For the next 24 hours, DO NOT: -Drive a car -Paediatric nurse -Drink alcoholic beverages -Take any medication unless instructed by your physician -Make any legal decisions or sign important papers.  Meals: Start with liquid foods such as gelatin or soup. Progress to regular foods as tolerated. Avoid greasy, spicy, heavy foods. If nausea and/or vomiting occur, drink only clear liquids until the nausea and/or vomiting subsides. Call your physician if vomiting continues.  Special Instructions/Symptoms: Your throat may feel dry or sore from the anesthesia or the breathing tube placed in your throat during surgery. If this causes discomfort, gargle with warm salt water. The discomfort should disappear within 24 hours.  If you had a scopolamine patch placed behind your ear for the management of post- operative nausea and/or vomiting:  1. The medication in the patch is effective for 72 hours, after which it should be removed.  Wrap patch in a tissue and discard in the trash. Wash hands thoroughly with soap and water. 2. You may remove the patch earlier than 72 hours if you experience unpleasant side effects which may include dry mouth, dizziness or visual disturbances. 3. Avoid touching the patch. Wash your hands with soap and water after  contact with the patch.    Regional Anesthesia Blocks  1. Numbness or the inability to move the "blocked" extremity may last from 3-48 hours after placement. The length of time depends on the medication injected and your individual response to the medication. If the numbness is not going away after 48 hours, call your surgeon.  2. The extremity that is blocked will  need to be protected until the numbness is gone and the  Strength has returned. Because you cannot feel it, you will need to take extra care to avoid injury. Because it may be weak, you may have difficulty moving it or using it. You may not know what position it is in without looking at it while the block is in effect.  3. For blocks in the legs and feet, returning to weight bearing and walking needs to be done carefully. You will need to wait until the numbness is entirely gone and the strength has returned. You should be able to move your leg and foot normally before you try and bear weight or walk. You will need someone to be with you when you first try to ensure you do not fall and possibly risk injury.  4. Bruising and tenderness at the needle site are common side effects and will resolve in a few days.  5. Persistent numbness or new problems with movement should be communicated to the surgeon or the [facility] [phone] [place] [phone]).

## 2019-03-02 NOTE — Anesthesia Procedure Notes (Signed)
Anesthesia Regional Block: Adductor canal block   Pre-Anesthetic Checklist: ,, timeout performed, Correct Patient, Correct Site, Correct Laterality, Correct Procedure, Correct Position, site marked, Risks and benefits discussed,  Surgical consent,  Pre-op evaluation,  At surgeon's request and post-op pain management  Laterality: Right  Prep: chloraprep       Needles:  Injection technique: Single-shot  Needle Type: Echogenic Needle     Needle Length: 10cm  Needle Gauge: 21     Additional Needles:   Narrative:  Start time: 03/02/2019 7:26 AM End time: 03/02/2019 7:29 AM Injection made incrementally with aspirations every 5 mL.  Performed by: Personally  Anesthesiologist: Audry Pili, MD  Additional Notes: No pain on injection. No increased resistance to injection. Injection made in 5cc increments. Good needle visualization. Patient tolerated the procedure well.

## 2019-03-02 NOTE — Progress Notes (Signed)
Assisted Dr. Brock with right, ultrasound guided, popliteal/saphenous block. Side rails up, monitors on throughout procedure. See vital signs in flow sheet. Tolerated Procedure well. 

## 2019-03-03 ENCOUNTER — Encounter (HOSPITAL_BASED_OUTPATIENT_CLINIC_OR_DEPARTMENT_OTHER): Payer: Self-pay | Admitting: Orthopaedic Surgery

## 2019-03-08 ENCOUNTER — Telehealth: Payer: Self-pay | Admitting: *Deleted

## 2019-03-08 NOTE — Telephone Encounter (Signed)
Message received from patient requesting to move his appts on 03/16/19 out to the end of May, early June d/t recent foot surgery.  [name] notified and order received to move appts out per pt.'s request.  Message sent to scheduling.

## 2019-03-16 ENCOUNTER — Other Ambulatory Visit (HOSPITAL_BASED_OUTPATIENT_CLINIC_OR_DEPARTMENT_OTHER): Payer: Self-pay

## 2019-03-16 ENCOUNTER — Inpatient Hospital Stay: Payer: Medicare Other

## 2019-03-16 ENCOUNTER — Ambulatory Visit: Payer: Self-pay | Admitting: Hematology & Oncology

## 2019-03-18 ENCOUNTER — Ambulatory Visit (INDEPENDENT_AMBULATORY_CARE_PROVIDER_SITE_OTHER): Payer: Medicare Other | Admitting: Psychology

## 2019-03-18 DIAGNOSIS — F4321 Adjustment disorder with depressed mood: Secondary | ICD-10-CM

## 2019-03-23 ENCOUNTER — Ambulatory Visit: Payer: Self-pay

## 2019-03-25 ENCOUNTER — Ambulatory Visit (INDEPENDENT_AMBULATORY_CARE_PROVIDER_SITE_OTHER): Payer: Medicare Other | Admitting: Psychology

## 2019-03-25 DIAGNOSIS — F4321 Adjustment disorder with depressed mood: Secondary | ICD-10-CM

## 2019-04-01 ENCOUNTER — Ambulatory Visit (INDEPENDENT_AMBULATORY_CARE_PROVIDER_SITE_OTHER): Payer: Medicare Other | Admitting: Psychology

## 2019-04-01 DIAGNOSIS — F4321 Adjustment disorder with depressed mood: Secondary | ICD-10-CM | POA: Diagnosis not present

## 2019-04-07 ENCOUNTER — Other Ambulatory Visit: Payer: Self-pay | Admitting: Family Medicine

## 2019-04-13 ENCOUNTER — Encounter: Payer: Self-pay | Admitting: Hematology & Oncology

## 2019-04-13 ENCOUNTER — Inpatient Hospital Stay: Payer: Medicare Other | Attending: Hematology & Oncology

## 2019-04-13 ENCOUNTER — Ambulatory Visit (HOSPITAL_BASED_OUTPATIENT_CLINIC_OR_DEPARTMENT_OTHER)
Admission: RE | Admit: 2019-04-13 | Discharge: 2019-04-13 | Disposition: A | Discharge status: Home / Self Care | Payer: Medicare Other | Source: Ambulatory Visit | Attending: Hematology & Oncology | Admitting: Hematology & Oncology

## 2019-04-13 ENCOUNTER — Inpatient Hospital Stay: Payer: Medicare Other | Admitting: Hematology & Oncology

## 2019-04-13 ENCOUNTER — Other Ambulatory Visit: Payer: Self-pay

## 2019-04-13 VITALS — BP 124/74 | HR 63 | Resp 16 | Ht 70.0 in | Wt 207.1 lb

## 2019-04-13 DIAGNOSIS — Z7901 Long term (current) use of anticoagulants: Secondary | ICD-10-CM | POA: Insufficient documentation

## 2019-04-13 DIAGNOSIS — C9110 Chronic lymphocytic leukemia of B-cell type not having achieved remission: Secondary | ICD-10-CM | POA: Diagnosis not present

## 2019-04-13 DIAGNOSIS — R591 Generalized enlarged lymph nodes: Secondary | ICD-10-CM | POA: Diagnosis not present

## 2019-04-13 DIAGNOSIS — I2699 Other pulmonary embolism without acute cor pulmonale: Secondary | ICD-10-CM | POA: Diagnosis not present

## 2019-04-13 DIAGNOSIS — R911 Solitary pulmonary nodule: Secondary | ICD-10-CM

## 2019-04-13 LAB — LACTATE DEHYDROGENASE: LDH: 272 U/L — ABNORMAL HIGH (ref 98–192)

## 2019-04-13 LAB — CBC WITH DIFFERENTIAL (CANCER CENTER ONLY)
Abs Immature Granulocytes: 0.01 10*3/uL (ref 0.00–0.07)
Basophils Absolute: 0 10*3/uL (ref 0.0–0.1)
Basophils Relative: 1 %
Eosinophils Absolute: 0.1 10*3/uL (ref 0.0–0.5)
Eosinophils Relative: 1 %
HCT: 41.5 % (ref 39.0–52.0)
Hemoglobin: 13.9 g/dL (ref 13.0–17.0)
Immature Granulocytes: 0 %
Lymphocytes Relative: 65 %
Lymphs Abs: 4.1 10*3/uL — ABNORMAL HIGH (ref 0.7–4.0)
MCH: 32.1 pg (ref 26.0–34.0)
MCHC: 33.5 g/dL (ref 30.0–36.0)
MCV: 95.8 fL (ref 80.0–100.0)
Monocytes Absolute: 0.8 10*3/uL (ref 0.1–1.0)
Monocytes Relative: 14 %
Neutro Abs: 1.2 10*3/uL — ABNORMAL LOW (ref 1.7–7.7)
Neutrophils Relative %: 19 %
Platelet Count: 131 10*3/uL — ABNORMAL LOW (ref 150–400)
RBC: 4.33 MIL/uL (ref 4.22–5.81)
RDW: 12.5 % (ref 11.5–15.5)
WBC Count: 6.2 10*3/uL (ref 4.0–10.5)
WBC Morphology: REACTIVE
nRBC: 0 % (ref 0.0–0.2)

## 2019-04-13 LAB — CMP (CANCER CENTER ONLY)
ALT: 13 U/L (ref 0–44)
AST: 15 U/L (ref 15–41)
Albumin: 4.6 g/dL (ref 3.5–5.0)
Alkaline Phosphatase: 45 U/L (ref 38–126)
Anion gap: 8 (ref 5–15)
BUN: 18 mg/dL (ref 8–23)
CO2: 28 mmol/L (ref 22–32)
Calcium: 9.1 mg/dL (ref 8.9–10.3)
Chloride: 101 mmol/L (ref 98–111)
Creatinine: 1.34 mg/dL — ABNORMAL HIGH (ref 0.61–1.24)
GFR, Est AFR Am: >60 mL/min (ref >60)
GFR, Estimated: 52 mL/min — ABNORMAL LOW (ref >60)
Glucose, Bld: 93 mg/dL (ref 70–99)
Potassium: 3.9 mmol/L (ref 3.5–5.1)
Sodium: 137 mmol/L (ref 135–145)
Total Bilirubin: 0.6 mg/dL (ref 0.3–1.2)
Total Protein: 6.8 g/dL (ref 6.5–8.1)

## 2019-04-13 MED ORDER — IOHEXOL 300 MG/ML  SOLN
100.0000 mL | Freq: Once | INTRAMUSCULAR | Status: AC | PRN
  Administered 2019-04-13: 100 mL via INTRAVENOUS

## 2019-04-13 NOTE — Progress Notes (Signed)
Hematology and Oncology Follow Up Visit  [name] [facility] [id number] 1946-01-14 73 y.o. 04/13/2019   Principle Diagnosis:   Right lower lobe pulmonary nodule- non-malignant  Recurrent pulmonary embolism  Progressive  Current Therapy:    Xarelto 20 mg by mouth daily       Rituxan/bendamustine-s/p cycle #2 - given 06/18/2017      Interim History:  Mr.  Bell is back for follow-up.  Surprisingly, he did make it down to [place].  He was down there in February.  He also [place].  He showed me pictures.  He really had a wonderful time down there.  Thankfully, he made back before the world shut down due to the coronavirus.  He had a CT scan done today.  This was assess for progressive lymphadenopathy.  The CT scan report, he has slight enlargement of his lymph nodes.  There is nothing that looks significantly enlarged or has progressed.  He has foot surgery on the right foot.  He had foot reconstruction.  This was 6 weeks ago.  He has a walking boot.  We will start his physical therapy tomorrow.  He is had no fever.  He has had no nausea or vomiting.  He has had no rashes.  Has had no cardiac issues.  He is on Xarelto and doing well with this.  Overall, his performance status is ECOG 0.  Medications:  Current Outpatient Medications:  .  acetaminophen (TYLENOL) 500 MG tablet, Take 2 tablets (1,000 mg total) by mouth every 6 (six) hours as needed., Disp: 100 tablet, Rfl: 2 .  cholecalciferol (VITAMIN D) 1000 units tablet, Take 1,000 Units by mouth daily., Disp: , Rfl:  .  gabapentin (NEURONTIN) 300 MG capsule, Take 1 capsule (300 mg total) by mouth 3 (three) times daily., Disp: 90 capsule, Rfl: 2 .  gabapentin (NEURONTIN) 300 MG capsule, , Disp: , Rfl:  .  hydrochlorothiazide (HYDRODIURIL) 12.5 MG tablet, Take 1 tablet (12.5 mg total) by mouth daily., Disp: 90 tablet, Rfl: 1 .  LORazepam (ATIVAN) 0.5 MG tablet, TAKE 1 TABLET BY MOUTH EVERY DAY AS NEEDED FOR ANXIETY, Disp: 30  tablet, Rfl: 2 .  meclizine (ANTIVERT) 12.5 MG tablet, TAKE 1 TABLET BY MOUTH 3 TIMES A DAY AS NEEDED FOR DIZZINESS, Disp: 30 tablet, Rfl: 0 .  rivaroxaban (XARELTO) 20 MG TABS tablet, Take 1 tablet (20 mg total) by mouth daily with supper., Disp: 90 tablet, Rfl: 3 .  rosuvastatin (CRESTOR) 10 MG tablet, TAKE 1 TABLET BY MOUTH EVERY DAY, Disp: 90 tablet, Rfl: 3 .  sertraline (ZOLOFT) 100 MG tablet, Take 100 mg by mouth daily., Disp: , Rfl:  .  tiZANidine (ZANAFLEX) 2 MG tablet, Take 1 tablet (2 mg total) by mouth every 6 (six) hours as needed for muscle spasms., Disp: 30 tablet, Rfl: 0  Allergies:  Allergies  Allergen Reactions  . Hydrocodone-Acetaminophen Shortness Of Breath  . Oxycodone Hcl Shortness Of Breath  . Telmisartan-Hctz Shortness Of Breath and Palpitations    Dizziness, too  . Ramipril   . Aspirin Other (See Comments) and Rash    Other Reaction: confusion Can't take high dose  . Atorvastatin Other (See Comments)    myalgia  . Buspirone Hcl Other (See Comments)    headache  . Codeine Nausea Only and Other (See Comments)    Other Reaction: confusion  . Codeine Phosphate Itching and Nausea Only  . Morphine Sulfate Itching  . Paroxetine Other (See Comments)    Paresthesias: R  arm, R leg, r side of face  . Rabeprazole Other (See Comments)    headache    Past Medical History, Surgical history, Social history, and Family History were reviewed and updated.  Review of Systems: Review of Systems  Constitutional: Negative.   HENT: Negative.   Eyes: Negative.   Respiratory: Negative.   Cardiovascular: Negative.   Gastrointestinal: Negative.   Genitourinary: Negative.   Musculoskeletal: Negative.   Skin: Negative.   Neurological: Negative.   Endo/Heme/Allergies: Negative.   Psychiatric/Behavioral: Negative.     Physical Exam:  height is 5\' 10"  (1.778 m) and weight is 207 lb 1.3 oz (93.9 kg). His blood pressure is 124/74 and his pulse is 63. His respiration is 16 and  oxygen saturation is 100%.   Physical Exam Vitals signs reviewed.  HENT:     Head: Normocephalic and atraumatic.  Eyes:     Pupils: Pupils are equal, round, and reactive to light.  Neck:     Musculoskeletal: Normal range of motion.  Cardiovascular:     Rate and Rhythm: Normal rate and regular rhythm.     Heart sounds: Normal heart sounds.  Pulmonary:     Effort: Pulmonary effort is normal.     Breath sounds: Normal breath sounds.  Abdominal:     General: Bowel sounds are normal.     Palpations: Abdomen is soft.  Musculoskeletal: Normal range of motion.        General: No tenderness or deformity.  Lymphadenopathy:     Cervical: No cervical adenopathy.  Skin:    General: Skin is warm and dry.     Findings: No erythema or rash.  Neurological:     Mental Status: He is alert and oriented to person, place, and time.  Psychiatric:        Behavior: Behavior normal.        Thought Content: Thought content normal.        Judgment: Judgment normal.     Lab Results  Component Value Date   WBC 6.2 04/13/2019   HGB 13.9 04/13/2019   HCT 41.5 04/13/2019   MCV 95.8 04/13/2019   PLT 131 (L) 04/13/2019     Chemistry      Component Value Date/Time   NA 137 04/13/2019 1032   NA 145 07/22/2017 0756   NA 139 06/27/2016 1410   K 3.9 04/13/2019 1032   K 4.3 07/22/2017 0756   K 5.1 06/27/2016 1410   CL 101 04/13/2019 1032   CL 105 07/22/2017 0756   CO2 28 04/13/2019 1032   CO2 30 07/22/2017 0756   CO2 28 06/27/2016 1410   BUN 18 04/13/2019 1032   BUN 17 07/22/2017 0756   BUN 20.9 06/27/2016 1410   CREATININE 1.34 (H) 04/13/2019 1032   CREATININE 1.6 (H) 07/22/2017 0756   CREATININE 1.6 (H) 06/27/2016 1410      Component Value Date/Time   CALCIUM 9.1 04/13/2019 1032   CALCIUM 9.6 07/22/2017 0756   CALCIUM 9.6 06/27/2016 1410   ALKPHOS 45 04/13/2019 1032   ALKPHOS 59 07/22/2017 0756   ALKPHOS 62 06/27/2016 1410   AST 15 04/13/2019 1032   AST 18 06/27/2016 1410   ALT 13  04/13/2019 1032   ALT 24 07/22/2017 0756   ALT 24 06/27/2016 1410   BILITOT 0.6 04/13/2019 1032   BILITOT 0.46 06/27/2016 1410         Impression and Plan: [name] is 73 year-old African-American gentleman. He has recurrent pulmonary embolism. I  think he needs lifelong anticoagulation.He is doing very well on Xarelto. He enjoys being on Xarelto.  We will plan to get him back in 4 months.  I will think we need to do a scan on him unless he starts having symptoms, or we find large lymph nodes on his exam or if his blood counts really are different.  I does hope that when he comes back, he will not be having the walking boot on his right leg.    Marland KitchenVolanda Napoleon, MD 6/2/202012:23 PM

## 2019-04-14 LAB — PROTEIN ELECTROPHORESIS, SERUM, WITH REFLEX
A/G Ratio: 1.6 (ref 0.7–1.7)
Albumin ELP: 3.9 g/dL (ref 2.9–4.4)
Alpha-1-Globulin: 0.2 g/dL (ref 0.0–0.4)
Alpha-2-Globulin: 0.6 g/dL (ref 0.4–1.0)
Beta Globulin: 1 g/dL (ref 0.7–1.3)
Gamma Globulin: 0.7 g/dL (ref 0.4–1.8)
Globulin, Total: 2.5 g/dL (ref 2.2–3.9)
Total Protein ELP: 6.4 g/dL (ref 6.0–8.5)

## 2019-04-14 LAB — IGG, IGA, IGM
IgA: 104 mg/dL (ref 61–437)
IgG (Immunoglobin G), Serum: 790 mg/dL (ref 603–1613)
IgM (Immunoglobulin M), Srm: 96 mg/dL (ref 15–143)

## 2019-04-14 LAB — KAPPA/LAMBDA LIGHT CHAINS
Kappa free light chain: 35.9 mg/L — ABNORMAL HIGH (ref 3.3–19.4)
Kappa, lambda light chain ratio: 3.09 — ABNORMAL HIGH (ref 0.26–1.65)
Lambda free light chains: 11.6 mg/L (ref 5.7–26.3)

## 2019-04-14 LAB — BETA 2 MICROGLOBULIN, SERUM: Beta-2 Microglobulin: 2.2 mg/L (ref 0.6–2.4)

## 2019-04-15 ENCOUNTER — Other Ambulatory Visit: Payer: Self-pay | Admitting: Hematology & Oncology

## 2019-04-15 DIAGNOSIS — C9110 Chronic lymphocytic leukemia of B-cell type not having achieved remission: Secondary | ICD-10-CM

## 2019-04-16 ENCOUNTER — Other Ambulatory Visit: Payer: Self-pay | Admitting: Family Medicine

## 2019-04-20 ENCOUNTER — Telehealth: Payer: Self-pay

## 2019-04-20 NOTE — Telephone Encounter (Signed)
Tired to call patient. No answer and no voicemail to leave message. Will try again later. Need to discuss virtual visit for 04/26/19 and get consent or reschedule appointment.

## 2019-04-21 ENCOUNTER — Encounter: Payer: Self-pay | Admitting: Family Medicine

## 2019-04-21 LAB — FISH,CLL PROGNOSTIC PANEL

## 2019-04-21 NOTE — Telephone Encounter (Signed)
I called patient and he agreed to come in sooner for this pain issue.  Patient placed on schedule tomorrow, in office visit.

## 2019-04-21 NOTE — Telephone Encounter (Signed)
2nd attempt: Tried to reach pt but couldn't leave a voicemail because it was full. Need to discuss a possible appt with Dr. [name] on 04/26/19. Will try again later.

## 2019-04-21 NOTE — Telephone Encounter (Signed)
Please see message and advise.  Thank you. ° °

## 2019-04-22 ENCOUNTER — Ambulatory Visit: Payer: Medicare Other | Admitting: Family Medicine

## 2019-04-22 ENCOUNTER — Other Ambulatory Visit: Payer: Self-pay

## 2019-04-22 ENCOUNTER — Ambulatory Visit (INDEPENDENT_AMBULATORY_CARE_PROVIDER_SITE_OTHER): Payer: Medicare Other | Admitting: Psychology

## 2019-04-22 ENCOUNTER — Encounter: Payer: Self-pay | Admitting: Family Medicine

## 2019-04-22 VITALS — BP 136/84 | HR 74 | Temp 98.2°F | Ht 70.0 in

## 2019-04-22 DIAGNOSIS — F4321 Adjustment disorder with depressed mood: Secondary | ICD-10-CM | POA: Diagnosis not present

## 2019-04-22 DIAGNOSIS — M25531 Pain in right wrist: Secondary | ICD-10-CM | POA: Diagnosis not present

## 2019-04-22 NOTE — Progress Notes (Addendum)
Phone [phone]   Subjective:  [name] is a 73 y.o. year old very pleasant male patient who presents for/with See problem oriented charting Chief Complaint  Patient presents with  . Acute Visit  . Hand Pain   ROS- No fever, chills, cough, shortness of breath, body aches, sore throat, or loss of taste or smell    Past Medical History-  Patient Active Problem List   Diagnosis Date Noted  . S/P Bioprosthetic AVR (aortic valve replacement) in 2011 03/22/2015    Priority: High  . Recurrent pulmonary embolism ([place]) 11/09/2014    Priority: High  . Chronic lymphocytic leukemia ([place]) 11/11/2013    Priority: High  . Atrial fibrillation ([place]) 04/20/2007    Priority: High  . PROSTATE CANCER, HX OF 04/20/2007    Priority: High  . CKD (chronic kidney disease), stage III ([place]) 08/11/2017    Priority: Medium  . Cervical spondylosis     Priority: Medium  . Migraine 10/08/2010    Priority: Medium  . Insomnia 02/03/2008    Priority: Medium  . Anxiety state 09/06/2007    Priority: Medium  . HLD (hyperlipidemia) 04/20/2007    Priority: Medium  . Essential hypertension 04/20/2007    Priority: Medium  . Former smoker 08/29/2016    Priority: Low  . Lung nodule 11/02/2014    Priority: Low  . GERD 12/11/2009    Priority: Low  . MITRAL REGURGITATION 04/07/2009    Priority: Low  . PTSD (post-traumatic stress disorder) 09/29/2018  . BPPV (benign paroxysmal positional vertigo) 09/04/2017    Medications- reviewed and updated Current Outpatient Medications  Medication Sig Dispense Refill  . acetaminophen (TYLENOL) 500 MG tablet Take 2 tablets (1,000 mg total) by mouth every 6 (six) hours as needed. 100 tablet 2  . cholecalciferol (VITAMIN D) 1000 units tablet Take 1,000 Units by mouth daily.    . hydrochlorothiazide (HYDRODIURIL) 12.5 MG tablet TAKE 1 TABLET BY MOUTH EVERY DAY 90 tablet 1  . LORazepam (ATIVAN) 0.5 MG tablet TAKE 1 TABLET BY MOUTH EVERY DAY AS NEEDED FOR ANXIETY  30 tablet 2  . meclizine (ANTIVERT) 12.5 MG tablet TAKE 1 TABLET BY MOUTH 3 TIMES A DAY AS NEEDED FOR DIZZINESS 30 tablet 0  . rivaroxaban (XARELTO) 20 MG TABS tablet Take 1 tablet (20 mg total) by mouth daily with supper. 90 tablet 3  . rosuvastatin (CRESTOR) 10 MG tablet TAKE 1 TABLET BY MOUTH EVERY DAY 90 tablet 3  . sertraline (ZOLOFT) 100 MG tablet Take 100 mg by mouth daily.     No current facility-administered medications for this visit.      Objective:  BP 136/84 (BP Location: Left Arm, Patient Position: Sitting, Cuff Size: Normal)   Pulse 74   Temp 98.2 F (36.8 C) (Oral)   Ht 5\' 10"  (1.778 m)   SpO2 96%   BMI 29.71 kg/m  Gen: NAD, resting comfortably CV: RRR no murmurs rubs or gallops Lungs: CTAB no crackles, wheeze, rhonchi Ext: no edema Skin: warm, dry Neuro: good strength in hand MSK: right wrist pain not noted with palpation- patient reports sharp pain at his wrist when he makes "I love you sign from sign language" at times and then other times reports pain into 5th finger. With phalen- no pain or numbness. With Tinel's test at wrist- reports pain at wrist described as sharp pain.     Assessment and Plan   Right wrist Pain S: Sx x 1 month. Pain is on the dorsal aspect  of the hand but can get pain into 5th finger as well. Pain is stabbing. Worse with bending 3-4 fingers and extending 2 and 5 fingers at the same time (I love you sign in sign language). Sx improve throughout the day-worse in the morning and wrist feels stiff. He has broken his hand in the past. No recent XR. Denies swelling. He has tried Tylenol, CBD oil, topical Voltaren with minimal relief. Has not tried heat or ice. Pain is non-radiating. Reports occasional numbness in the tips of the right 3, 4, 5 fingers.   A/P: This could be carpal tunnel - but with pain shooting into 5th finger at times I am not confident of diagnosis. Advised him to trial cock up wrist splint to see if it helps overnight since pain  is worst in AM while he waits to see Dr. Junius Roads of [name]- he has seen him in the past and is pleased to hear he is back in town. I held off on x-ray to get Dr. Junius Roads opinion on that vs. Ultrasound.   Future Appointments  Date Time Provider [place]  04/26/2019 10:15 AM Josue Hector, MD [facility]  05/04/2019 10:40 AM [name], MD [facility]  05/20/2019  1:00 PM Oren Binet, PhD [facility] None  08/17/2019 11:30 AM [facility] None  08/17/2019 12:00 PM [place], Rudell Cobb, MD [facility] None   Lab/Order associations: Right wrist pain - Plan: Ambulatory referral to Orthopedics  Return precautions advised.  Garret Reddish, MD

## 2019-04-22 NOTE — Patient Instructions (Addendum)
We will call you within two weeks about your referral to Dr. Junius Roads. If you do not hear within 3 weeks, give [name] a call.   This could be carpal tunnel but fact your 5th finger bothers you doesn't go along with that exactly. Also you have tried some good conservative measures already- so lets get Dr. Junius Roads opinion- you could certainly try cock up wrist splint before yous ee him if you would like

## 2019-04-23 NOTE — Telephone Encounter (Signed)
Virtual Visit Pre-Appointment Phone Call  "(Name), I am calling you today to discuss your upcoming appointment. We are currently trying to limit exposure to the virus that causes COVID-19 by seeing patients at home rather than in the office."  1. "What is the BEST phone number to call the day of the visit?" - include this in appointment notes  2. "Do you have or have access to (through a family member/friend) a smartphone with video capability that we can use for your visit?" a. If yes - list this number in appt notes as "cell" (if different from BEST phone #) and list the appointment type as a VIDEO visit in appointment notes b. If no - list the appointment type as a PHONE visit in appointment notes  3. Confirm consent - "In the setting of the current Covid19 crisis, you are scheduled for a (phone or video) visit with your provider on (date) at (time).  Just as we do with many in-office visits, in order for you to participate in this visit, we must obtain consent.  If you'd like, I can send this to your mychart (if signed up) or email for you to review.  Otherwise, I can obtain your verbal consent now.  All virtual visits are billed to your insurance company just like a normal visit would be.  By agreeing to a virtual visit, we'd like you to understand that the technology does not allow for your provider to perform an examination, and thus may limit your provider's ability to fully assess your condition. If your provider identifies any concerns that need to be evaluated in person, we will make arrangements to do so.  Finally, though the technology is pretty good, we cannot assure that it will always work on either your or our end, and in the setting of a video visit, we may have to convert it to a phone-only visit.  In either situation, we cannot ensure that we have a secure connection.  Are you willing to proceed?YES  4. Advise patient to be prepared - "Two hours prior to your appointment, go  ahead and check your blood pressure, pulse, oxygen saturation, and your weight (if you have the equipment to check those) and write them all down. When your visit starts, your provider will ask you for this information. If you have an Apple Watch or Kardia device, please plan to have heart rate information ready on the day of your appointment. Please have a pen and paper handy nearby the day of the visit as well."  5. Give patient instructions for MyChart download to smartphone OR [email or web address] as below if video visit (depending on what platform provider is using)  6. Inform patient they will receive a phone call 15 minutes prior to their appointment time (may be from unknown caller ID) so they should be prepared to answer    [place] has been deemed a candidate for a follow-up tele-health visit to limit community exposure during the Covid-19 pandemic. I spoke with the patient via phone to ensure availability of phone/video source, confirm preferred email & phone number, and discuss instructions and expectations.  I reminded Mariana Goytia [place] to be prepared with any vital sign and/or heart rhythm information that could potentially be obtained via home monitoring, at the time of his visit. I reminded Ithiel Liebler [place] to expect a phone call prior to his visit.  Michaelyn Barter, RN 04/23/2019 3:21 PM  IF USING DOXIMITY  or DOXY.ME - The patient will receive a link just prior to their visit by text.     FULL LENGTH CONSENT FOR TELE-HEALTH VISIT   I hereby voluntarily request, consent and authorize [facility] and its employed or contracted physicians, physician assistants, nurse practitioners or other licensed health care professionals (the Practitioner), to provide me with telemedicine health care services (the "Services") as deemed necessary by the treating Practitioner. I acknowledge and consent to receive the Services by the Practitioner via telemedicine. I  understand that the telemedicine visit will involve communicating with the Practitioner through live audiovisual communication technology and the disclosure of certain medical information by electronic transmission. I acknowledge that I have been given the opportunity to request an in-person assessment or other available alternative prior to the telemedicine visit and am voluntarily participating in the telemedicine visit.  I understand that I have the right to withhold or withdraw my consent to the use of telemedicine in the course of my care at any time, without affecting my right to future care or treatment, and that the Practitioner or I may terminate the telemedicine visit at any time. I understand that I have the right to inspect all information obtained and/or recorded in the course of the telemedicine visit and may receive copies of available information for a reasonable fee.  I understand that some of the potential risks of receiving the Services via telemedicine include:  Marland Kitchen Delay or interruption in medical evaluation due to technological equipment failure or disruption; . Information transmitted may not be sufficient (e.g. poor resolution of images) to allow for appropriate medical decision making by the Practitioner; and/or  . In rare instances, security protocols could fail, causing a breach of personal health information.  Furthermore, I acknowledge that it is my responsibility to provide information about my medical history, conditions and care that is complete and accurate to the best of my ability. I acknowledge that Practitioner's advice, recommendations, and/or decision may be based on factors not within their control, such as incomplete or inaccurate data provided by me or distortions of diagnostic images or specimens that may result from electronic transmissions. I understand that the practice of medicine is not an exact science and that Practitioner makes no warranties or guarantees  regarding treatment outcomes. I acknowledge that I will receive a copy of this consent concurrently upon execution via email to the email address I last provided but may also request a printed copy by calling the office of [facility].    I understand that my insurance will be billed for this visit.   I have read or had this consent read to me. . I understand the contents of this consent, which adequately explains the benefits and risks of the Services being provided via telemedicine.  . I have been provided ample opportunity to ask questions regarding this consent and the Services and have had my questions answered to my satisfaction. . I give my informed consent for the services to be provided through the use of telemedicine in my medical care  By participating in this telemedicine visit I agree to the above.

## 2019-04-23 NOTE — Progress Notes (Signed)
Virtual Visit via Video Note   This visit type was conducted due to national recommendations for restrictions regarding the COVID-19 Pandemic (e.g. social distancing) in an effort to limit this patient's exposure and mitigate transmission in our community.  Due to his co-morbid illnesses, this patient is at least at moderate risk for complications without adequate follow up.  This format is felt to be most appropriate for this patient at this time.  All issues noted in this document were discussed and addressed.  A limited physical exam was performed with this format.  Please refer to the patient's chart for his consent to telehealth for [facility].   Date:  04/26/2019   ID:  [name], DOB 11/17/1945, MRN [id number]  Patient Location: Home Provider Location: Office  PCP:  [name], MD  Cardiologist:   [name] Electrophysiologist:  None   Evaluation Performed:  Follow-Up Visit  Chief Complaint:  AVR PAF   History of Present Illness:    73 y.o. with bioprosthetic AVR Dr [name] at [facility] July 2011 for severe AR No CAD. History of PAF and recurrent DVT/PE on chronic xarelto Also had LUE cephalic/basilic thrombus. CRF;s include HTN and HLD. Has CLL followed by Dr [name] post chemoRx. Also had radical prostatectomy for cancer In 2016 had RLL wedge resection for granulomatous benign nodule Has had sleep issues with nightmares and we have since stopped his beta blocker including lopressor and atenolol He is seen at [facility] for PTSD and uses Zoloft   Has been seen by [facility] and primary since our last visit Has some right wrist pain and stable lymphadenopathy. Had right foot reconstruction 03/02/19 involving the right gastrocnemius tendon for foot drop Traveled to [place] in February  [place] and a Rx at [place] for PTSD   The patient  does not have symptoms concerning for COVID-19 infection (fever, chills, cough, or new shortness of breath).    Past  Medical History:  Diagnosis Date  . Anticoagulants causing adverse effect in therapeutic use   . Anxiety    Paxil seemed to cause odd neuro side effects; better on prozac as of 09/2015  . Aortic regurgitation 04/2007 surgery   Resolved with tissue AVR at [facility]  . Cervical spondylosis    ESI by [name] in Ortho. 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 Chronic lymphocytic leukemia (CLL), B-cell ([place]) approx 2012   stable on f/u's with [name] (most recent 07/2017)  . History of prostate cancer 2003   Rad prost  . History of pulmonary embolism 2011; 10/2014   Recurrence off of anticoagulation 10/2014: per hem/onc ([name]) pt needs lifelong anticoagulation (hypercoag w/u neg).  . Hyperlipidemia    statin-intolerant, except for crestor low dose.  Marland Kitchen Hypertension   . Migraine syndrome   . Mitral regurgitation   . PAF (paroxysmal atrial fibrillation) ([place])   . Panic attacks    "           "             "                  "                  "                       "                             "                         .  Pulmonary nodule, right 2015   RLL: resected at [facility] --VATS; Benign RLL nodule (fungal and AFB stains neg).  Plan is for [facility] thoracic surg to do f/u o/v & CT chest 1 yr   . Right-sided headache 07/2016   Primary stabbing HA or migraine per neuro (Dr. Tomi Likens).  Gabapentin and topamax not tolerated.  These resolved spontaneously.   Past Surgical History:  Procedure Laterality Date  . AORTIC VALVE REPLACEMENT  2011   [facility]  (bioprosthetic)  . CARDIAC CATHETERIZATION  04/2010   Normal coronaries.  . Carotid dopplers  08/2015   NORMAL  . CATARACT EXTRACTION     L 12/06/09   R 09/14/09  . COLONOSCOPY  01/26/13   tic's, o/w normal.  ([facility])--recall 10 yrs.  Marland Kitchen PENILE PROSTHESIS IMPLANT    . PROSTATECTOMY  04/2002   for prostate cancer  . Pulmonary nodule resection  Fall/winter 2016   [facility]  . SHOULDER SURGERY     rotator cuff on both arms   . TIBIALIS TENDON TRANSFER /  REPAIR Right 03/02/2019   Procedure: RECONSTRUCTION OF RIGHT  ANTERIOR TIBIALIS TENDON;  Surgeon: Erle Crocker, MD;  Location: [place];  Service: Orthopedics;  Laterality: Right;  . TRANSTHORACIC ECHOCARDIOGRAM  09/20/2014; 03/2016; 03/2017   2015-mild LVH, EF 50-55%, wall motion nl, grade I diast dysfxn, prosth aort valve good,transaortic gradients decreased compared to echo 11/2013.   03/2016: EF 55-60%, normal LV function, severe LVH, normal diast fxn, mild increase in AV gradient compared to 09/2014 echo.  2018: EF 55-60%, normal LV fxn, grd II DD, AV stable/gradient's stable.     Current Meds  Medication Sig  . acetaminophen (TYLENOL) 500 MG tablet Take 2 tablets (1,000 mg total) by mouth every 6 (six) hours as needed.  . cholecalciferol (VITAMIN D) 1000 units tablet Take 1,000 Units by mouth daily.  . hydrochlorothiazide (HYDRODIURIL) 12.5 MG tablet TAKE 1 TABLET BY MOUTH EVERY DAY  . LORazepam (ATIVAN) 0.5 MG tablet TAKE 1 TABLET BY MOUTH EVERY DAY AS NEEDED FOR ANXIETY  . meclizine (ANTIVERT) 12.5 MG tablet TAKE 1 TABLET BY MOUTH 3 TIMES A DAY AS NEEDED FOR DIZZINESS  . rivaroxaban (XARELTO) 20 MG TABS tablet Take 1 tablet (20 mg total) by mouth daily with supper.  . rosuvastatin (CRESTOR) 10 MG tablet TAKE 1 TABLET BY MOUTH EVERY DAY  . sertraline (ZOLOFT) 100 MG tablet Take 100 mg by mouth daily.     Allergies:   Hydrocodone-acetaminophen, Oxycodone hcl, Telmisartan-hctz, Ramipril, Aspirin, Atorvastatin, Buspirone hcl, Codeine, Codeine phosphate, Morphine sulfate, Paroxetine, and Rabeprazole   Social History   Tobacco Use  . Smoking status: Former Smoker    Packs/day: 1.00    Years: 12.00    Pack years: 12.00    Types: Cigarettes    Start date: 11/28/1968    Quit date: 01/29/1981    Years since quitting: 38.2  . Smokeless tobacco: Never Used  . Tobacco comment: quit 73 yo   Substance Use Topics  . Alcohol use: Yes    Alcohol/week: 0.0 standard drinks     Comment: occasional  . Drug use: No     Family Hx: The patient's family history includes Cancer in his sister; Colon cancer (age of onset: 87) in his sister; Prostate cancer in his father; Stroke in his brother, mother, and sister. There is no history of Heart attack.  ROS:   Please see the history of present illness.     All other systems reviewed and  are negative.   Prior CV studies:   The following studies were reviewed today:  Echo 04/08/18  EF 60-65% mild MR moderate LAE stable mean gradient through AVR 18 mmHg peak 31 mmHg DVI 0.32   Labs/Other Tests and Data Reviewed:    EKG:  SR rate 73 RBBB/LAFB 10/29/18   Recent Labs: 04/13/2019: ALT 13; BUN 18; Creatinine 1.34; Hemoglobin 13.9; Platelet Count 131; Potassium 3.9; Sodium 137   Recent Lipid Panel Lab Results  Component Value Date/Time   CHOL 184 09/29/2018 01:42 PM   TRIG 226.0 (H) 09/29/2018 01:42 PM   HDL 41.60 09/29/2018 01:42 PM   CHOLHDL 4 09/29/2018 01:42 PM   LDLCALC 146 (H) 08/11/2017 08:43 AM   LDLDIRECT 105.0 09/29/2018 01:42 PM    Wt Readings from Last 3 Encounters:  04/13/19 93.9 kg  03/02/19 93.6 kg  02/12/19 89.4 kg     Objective:    Vital Signs:  BP 117/73   Pulse 77   Ht 5\' 10"  (1.778 m)   BMI 29.71 kg/m    Skin warm and dry No distress No tachypnea No JVP elevation  Neuro appears non focal No edema   ASSESSMENT & PLAN:    Aortic insufficiency S/P Bioprosthetic AVR (aortic valve replacement) in 2011 Stable gradients mean 18 mmHg peak 31 mmHg DVI:  0.32 echo 04/08/18  F/u echo October 2020  History of DVT (deep vein thrombosis) and Recurrent pulmonary embolism Continue Xarelto. This is managed by heme/onc.   Paroxysmal atrial fibrillation Maintaining NSR.  Continue  Xarelto.   Essential hypertension Borderline control.  Continue to monitor.  HLD (hyperlipidemia) Has had leg pain with statins in past. Taking crestor 3 x/week   Lung nodule  Post VATS at [facility]  Recent  CT see above f/u [facility]   Carotid Bruit:  Left duplex no stenosis f/u 08/2019   Depression:  Better off paroxetine now on low dose prozac with tofranil  Headache:  F/u primary PRN tylenol ok has schrap metal on right side of head Cant have MRI  YOV:ZCHY with primary typically Cr around 1.6 on 02/16/18    BBB:  RBBB LAD post AVR ECG q 6 months follow for more advanced AV block   CLL:  F/u with Dr Jonette Eva post chemo observation recent CT scans including neck ok   Insomnia:  Improved off beta blocker and on zoloft   COVID-19 Education: The signs and symptoms of COVID-19 were discussed with the patient and how to seek care for testing (follow up with PCP or arrange E-visit).  The importance of social distancing was discussed today.  Time:   Today, I have spent 30 minutes with the patient with telehealth technology discussing the above problems.     Medication Adjustments/Labs and Tests Ordered: Current medicines are reviewed at length with the patient today.  Concerns regarding medicines are outlined above.   Tests Ordered:  Echo for AVR and carotid for bruit October  Medication Changes:  None   Disposition:  Follow up in 6 months  Signed, Jenkins Rouge, MD  04/26/2019 10:01 AM    [place]

## 2019-04-26 ENCOUNTER — Encounter: Payer: Self-pay | Admitting: Cardiovascular Disease

## 2019-04-26 ENCOUNTER — Other Ambulatory Visit: Payer: Self-pay

## 2019-04-26 ENCOUNTER — Telehealth (INDEPENDENT_AMBULATORY_CARE_PROVIDER_SITE_OTHER): Payer: Medicare Other | Admitting: Cardiovascular Disease

## 2019-04-26 ENCOUNTER — Encounter: Payer: Self-pay | Admitting: Family Medicine

## 2019-04-26 ENCOUNTER — Ambulatory Visit (INDEPENDENT_AMBULATORY_CARE_PROVIDER_SITE_OTHER): Payer: Medicare Other | Admitting: Family Medicine

## 2019-04-26 VITALS — BP 117/73 | HR 77 | Ht 70.0 in

## 2019-04-26 DIAGNOSIS — Z952 Presence of prosthetic heart valve: Secondary | ICD-10-CM | POA: Diagnosis not present

## 2019-04-26 DIAGNOSIS — R0989 Other specified symptoms and signs involving the circulatory and respiratory systems: Secondary | ICD-10-CM

## 2019-04-26 DIAGNOSIS — M79641 Pain in right hand: Secondary | ICD-10-CM

## 2019-04-26 NOTE — Progress Notes (Signed)
Office Visit Note   Patient: [name]           Date of Birth: 08/22/1946           MRN: [id number] Visit Date: 04/26/2019 Requested by: [name], MD [place],  [place] [address] PCP: [name], MD  Subjective: Chief Complaint  Patient presents with  . Right Wrist - Pain    Pain in the wrist x 1 month. Occasional shooting pains up the wrist. Occasional pain into the right shoulder. Right hand dominant. Trying a wrist splint at night x 2 now - wakes up with less pain.    HPI: He is a 73 year old right-hand-dominant male with right hand and wrist pain.  Symptoms started about a month ago with no injury.  He started noticing pain when he first wakes up, very uncomfortable to put any pressure on his hand.  After about an hour the pain seems to get better but he still has pain when doing certain movements such as flexing his third and fourth fingers while extending the index and fifth.  Pain seems to occur on the dorsum and the palmar aspect of his hand.  He has not noticed any numbness or weakness.  He tries to avoid taking medications due to chronic kidney disease but he did have some Voltaren gel at home and tried it for a couple days with no improvement.  This past weekend he started wearing a carpal tunnel brace on his wrist and today he is actually feeling much better.  He does note some neck pain and right shoulder blade pain, but he has not had any radicular type symptoms.               ROS: Denies fevers or chills or respiratory symptoms.  All other systems were reviewed and are negative.  Objective: Vital Signs: There were no vitals taken for this visit.  Physical Exam:  General:  Alert and oriented, in no acute distress. Pulm:  Breathing unlabored. Psy:  Normal mood, congruent affect. Skin: No rash on his skin. Neck: He has some tightness and tenderness in the right-sided paraspinous muscles especially near the C4-5 level.  He has a tender  trigger point in the trapezius belly and another one in the rhomboid area on the right.  Full range of motion of the shoulder and neck with negative Spurling's test. Right wrist: Equivocal Phalen's test, negative Tinel's at the carpal tunnel.  No atrophy of the thenar muscles.  Full flexion and extension of all of his fingers with no triggering at the A1 pulleys.  He does have some tenderness near the fourth finger A1 pulley but not second or fifth.  No synovitis in any of his joints.  Imaging: Limited diagnostic ultrasound right hand and wrist: I do not see any joint effusions or synovial hypertrophy.  His fourth finger A1 pulley is slightly thickened but there is no detectable triggering.   Assessment & Plan: 1.  Improving right hand and wrist pain, etiology uncertain.  Could have arthritis in his hand, cannot rule out carpal tunnel syndrome given response to brace. -Discussed various options with him and he plans to continue wearing his wrist brace, he also has a steroid Dosepak at home that he never took and he might take that as well.  If symptoms persist could consider hand therapy referral.  We will take x-rays if fails conservative management.     Procedures: No procedures performed  No notes on file     PMFS History: Patient Active Problem List   Diagnosis Date Noted  . PTSD (post-traumatic stress disorder) 09/29/2018  . BPPV (benign paroxysmal positional vertigo) 09/04/2017  . CKD (chronic kidney disease), stage III ([place]) 08/11/2017  . Former smoker 08/29/2016  . Cervical spondylosis   . S/P Bioprosthetic AVR (aortic valve replacement) in 2011 03/22/2015  . Recurrent pulmonary embolism ([place]) 11/09/2014  . Lung nodule 11/02/2014  . Chronic lymphocytic leukemia ([place]) 11/11/2013  . Migraine 10/08/2010  . GERD 12/11/2009  . MITRAL REGURGITATION 04/07/2009  . Insomnia 02/03/2008  . Anxiety state 09/06/2007  . HLD (hyperlipidemia) 04/20/2007  . Essential hypertension  04/20/2007  . Atrial fibrillation ([place]) 04/20/2007  . PROSTATE CANCER, HX OF 04/20/2007   Past Medical History:  Diagnosis Date  . Anticoagulants causing adverse effect in therapeutic use   . Anxiety    Paxil seemed to cause odd neuro side effects; better on prozac as of 09/2015  . Aortic regurgitation 04/2007 surgery   Resolved with tissue AVR at [facility]  . Cervical spondylosis    ESI by [name] in Ortho. 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 Chronic lymphocytic leukemia (CLL), B-cell ([place]) approx 2012   stable on f/u's with [name] (most recent 07/2017)  . History of prostate cancer 2003   Rad prost  . History of pulmonary embolism 2011; 10/2014   Recurrence off of anticoagulation 10/2014: per hem/onc ([name]) pt needs lifelong anticoagulation (hypercoag w/u neg).  . Hyperlipidemia    statin-intolerant, except for crestor low dose.  Marland Kitchen Hypertension   . Migraine syndrome   . Mitral regurgitation   . PAF (paroxysmal atrial fibrillation) ([place])   . Panic attacks    "           "             "                  "                  "                       "                             "                         . Pulmonary nodule, right 2015   RLL: resected at [facility] --VATS; Benign RLL nodule (fungal and AFB stains neg).  Plan is for [facility] thoracic surg to do f/u o/v & CT chest 1 yr   . Right-sided headache 07/2016   Primary stabbing HA or migraine per neuro (Dr. Tomi Likens).  Gabapentin and topamax not tolerated.  These resolved spontaneously.    Family History  Problem Relation Age of Onset  . Cancer Sister        uterine  . Colon cancer Sister 46  . Stroke Mother   . Prostate cancer Father   . Stroke Sister   . Stroke Brother   . Heart attack Neg Hx     Past Surgical History:  Procedure Laterality Date  . AORTIC VALVE REPLACEMENT  2011   [facility]  (bioprosthetic)  . CARDIAC CATHETERIZATION  04/2010   Normal coronaries.  . Carotid dopplers  08/2015   NORMAL  . CATARACT  EXTRACTION      L 12/06/09   R 09/14/09  . COLONOSCOPY  01/26/13   tic's, o/w normal.  ([facility])--recall 10 yrs.  Marland Kitchen PENILE PROSTHESIS IMPLANT    . PROSTATECTOMY  04/2002   for prostate cancer  . Pulmonary nodule resection  Fall/winter 2016   [facility]  . SHOULDER SURGERY     rotator cuff on both arms   . TIBIALIS TENDON TRANSFER / REPAIR Right 03/02/2019   Procedure: RECONSTRUCTION OF RIGHT  ANTERIOR TIBIALIS TENDON;  Surgeon: Erle Crocker, MD;  Location: [place];  Service: Orthopedics;  Laterality: Right;  . TRANSTHORACIC ECHOCARDIOGRAM  09/20/2014; 03/2016; 03/2017   2015-mild LVH, EF 50-55%, wall motion nl, grade I diast dysfxn, prosth aort valve good,transaortic gradients decreased compared to echo 11/2013.   03/2016: EF 55-60%, normal LV function, severe LVH, normal diast fxn, mild increase in AV gradient compared to 09/2014 echo.  2018: EF 55-60%, normal LV fxn, grd II DD, AV stable/gradient's stable.   Social History   Occupational History    Comment: Retired - Tobacco   Tobacco Use  . Smoking status: Former Smoker    Packs/day: 1.00    Years: 12.00    Pack years: 12.00    Types: Cigarettes    Start date: 11/28/1968    Quit date: 01/29/1981    Years since quitting: 38.2  . Smokeless tobacco: Never Used  . Tobacco comment: quit 73 yo   Substance and Sexual Activity  . Alcohol use: Yes    Alcohol/week: 0.0 standard drinks    Comment: occasional  . Drug use: No  . Sexual activity: Not on file

## 2019-04-26 NOTE — Patient Instructions (Addendum)
Medication Instructions:   If you need a refill on your cardiac medications before your next appointment, please call your pharmacy.   Lab work:  If you have labs (blood work) drawn today and your tests are completely normal, you will receive your results only by: Marland Kitchen MyChart Message (if you have MyChart) OR . A paper copy in the mail If you have any lab test that is abnormal or we need to change your treatment, we will call you to review the results.  Testing/Procedures: Your physician has requested that you have a carotid duplex in October. This test is an ultrasound of the carotid arteries in your neck. It looks at blood flow through these arteries that supply the brain with blood. Allow one hour for this exam. There are no restrictions or special instructions.  Your physician has requested that you have an echocardiogram in October. Echocardiography is a painless test that uses sound waves to create images of your heart. It provides your doctor with information about the size and shape of your heart and how well your heart's chambers and valves are working. This procedure takes approximately one hour. There are no restrictions for this procedure.  Follow-Up: At [facility], you and your health needs are our priority.  As part of our continuing mission to provide you with exceptional heart care, we have created designated Provider Care Teams.  These Care Teams include your primary Cardiologist (physician) and Advanced Practice Providers (APPs -  Physician Assistants and Nurse Practitioners) who all work together to provide you with the care you need, when you need it. . You will need a follow up appointment in October to see Dr. [name] on same day as echocardiogram.    Someone will call you with appointment times for test and office visit.

## 2019-05-04 ENCOUNTER — Other Ambulatory Visit: Payer: Self-pay

## 2019-05-04 ENCOUNTER — Ambulatory Visit (INDEPENDENT_AMBULATORY_CARE_PROVIDER_SITE_OTHER): Payer: Medicare Other | Admitting: Family Medicine

## 2019-05-04 ENCOUNTER — Encounter: Payer: Self-pay | Admitting: Family Medicine

## 2019-05-04 VITALS — BP 112/74 | HR 74 | Temp 98.1°F | Ht 70.0 in | Wt 208.0 lb

## 2019-05-04 DIAGNOSIS — I2699 Other pulmonary embolism without acute cor pulmonale: Secondary | ICD-10-CM | POA: Diagnosis not present

## 2019-05-04 DIAGNOSIS — Z0000 Encounter for general adult medical examination without abnormal findings: Secondary | ICD-10-CM

## 2019-05-04 DIAGNOSIS — I480 Paroxysmal atrial fibrillation: Secondary | ICD-10-CM

## 2019-05-04 DIAGNOSIS — N183 Chronic kidney disease, stage 3 unspecified: Secondary | ICD-10-CM

## 2019-05-04 MED ORDER — MECLIZINE HCL 12.5 MG PO TABS: ORAL_TABLET | ORAL | 2 refills | Status: DC

## 2019-05-04 NOTE — Patient Instructions (Signed)
No labs today- we can complete in November depending on how things line up with your onology labs.   No changes planned at present  Hoping you can get out of that boot soon!

## 2019-05-04 NOTE — Progress Notes (Signed)
Phone: [phone]   Subjective:  Patient presents today for their annual physical. Chief complaint-noted.   See problem oriented charting- ROS- full  review of systems was completed and negative except for: Activity change, sinus pressure, eye discharge, eye itching, wheezing, seasonal allergies, lightheadedness, numbness, anxiety  The following were reviewed and entered/updated in epic: Past Medical History:  Diagnosis Date  . Anticoagulants causing adverse effect in therapeutic use   . Anxiety    Paxil seemed to cause odd neuro side effects; better on prozac as of 09/2015  . Aortic regurgitation 04/2007 surgery   Resolved with [facility]R at [facility]  . Cervical spondylosis    ESI by [name] in Ortho. 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 Chronic lymphocytic leukemia (CLL), B-cell ([place]) approx 2012   stable on f/u's with [name] (most recent 07/2017)  . History of prostate cancer 2003   Rad prost  . History of pulmonary embolism 2011; 10/2014   Recurrence off of anticoagulation 10/2014: per hem/onc ([name]) pt needs lifelong anticoagulation (hypercoag w/u neg).  . Hyperlipidemia    statin-intolerant, except for crestor low dose.  Marland Kitchen Hypertension   . Migraine syndrome   . Mitral regurgitation   . PAF (paroxysmal atrial fibrillation) ([place])   . Panic attacks    "           "             "                  "                  "                       "                             "                         . Pulmonary nodule, right 2015   RLL: resected at [facility] --VATS; Benign RLL nodule (fungal and AFB stains neg).  Plan is for [facility] thoracic surg to do f/u o/v & CT chest 1 yr   . Right-sided headache 07/2016   Primary stabbing HA or migraine per neuro (Dr. Tomi Likens).  Gabapentin and topamax not tolerated.  These resolved spontaneously.   Patient Active Problem List   Diagnosis Date Noted  . S/P Bioprosthetic AVR (aortic valve replacement) in 2011 03/22/2015    Priority: High  .  Recurrent pulmonary embolism ([place]) 11/09/2014    Priority: High  . Chronic lymphocytic leukemia ([place]) 11/11/2013    Priority: High  . Atrial fibrillation ([place]) 04/20/2007    Priority: High  . PROSTATE CANCER, HX OF 04/20/2007    Priority: High  . PTSD (post-traumatic stress disorder) 09/29/2018    Priority: Medium  . BPPV (benign paroxysmal positional vertigo) 09/04/2017    Priority: Medium  . CKD (chronic kidney disease), stage III ([place]) 08/11/2017    Priority: Medium  . Cervical spondylosis     Priority: Medium  . Migraine 10/08/2010    Priority: Medium  . Insomnia 02/03/2008    Priority: Medium  . Anxiety state 09/06/2007    Priority: Medium  . HLD (hyperlipidemia) 04/20/2007    Priority: Medium  . Essential hypertension 04/20/2007    Priority: Medium  . Former smoker  08/29/2016    Priority: Low  . Lung nodule 11/02/2014    Priority: Low  . GERD 12/11/2009    Priority: Low  . MITRAL REGURGITATION 04/07/2009    Priority: Low   Past Surgical History:  Procedure Laterality Date  . AORTIC VALVE REPLACEMENT  2011   [facility]  (bioprosthetic)  . CARDIAC CATHETERIZATION  04/2010   Normal coronaries.  . Carotid dopplers  08/2015   NORMAL  . CATARACT EXTRACTION     L 12/06/09   R 09/14/09  . COLONOSCOPY  01/26/13   tic's, o/w normal.  ([place])--recall 10 yrs.  Marland Kitchen PENILE PROSTHESIS IMPLANT    . PROSTATECTOMY  04/2002   for prostate cancer  . Pulmonary nodule resection  Fall/winter 2016   [facility]  . SHOULDER SURGERY     rotator cuff on both arms   . TIBIALIS TENDON TRANSFER / REPAIR Right 03/02/2019   Procedure: RECONSTRUCTION OF RIGHT  ANTERIOR TIBIALIS TENDON;  Surgeon: Erle Crocker, MD;  Location: [place];  Service: Orthopedics;  Laterality: Right;  . TRANSTHORACIC ECHOCARDIOGRAM  09/20/2014; 03/2016; 03/2017   2015-mild LVH, EF 50-55%, wall motion nl, grade I diast dysfxn, prosth aort valve good,transaortic gradients decreased compared to echo 11/2013.    03/2016: EF 55-60%, normal LV function, severe LVH, normal diast fxn, mild increase in AV gradient compared to 09/2014 echo.  2018: EF 55-60%, normal LV fxn, grd II DD, AV stable/gradient's stable.    Family History  Problem Relation Age of Onset  . Cancer Sister        uterine  . Colon cancer Sister 36  . Stroke Mother   . Prostate cancer Father   . Stroke Sister   . Stroke Brother   . Heart attack Neg Hx     Medications- reviewed and updated Current Outpatient Medications  Medication Sig Dispense Refill  . acetaminophen (TYLENOL) 500 MG tablet Take 2 tablets (1,000 mg total) by mouth every 6 (six) hours as needed. 100 tablet 2  . cholecalciferol (VITAMIN D) 1000 units tablet Take 1,000 Units by mouth daily.    . hydrochlorothiazide (HYDRODIURIL) 12.5 MG tablet TAKE 1 TABLET BY MOUTH EVERY DAY 90 tablet 1  . LORazepam (ATIVAN) 0.5 MG tablet TAKE 1 TABLET BY MOUTH EVERY DAY AS NEEDED FOR ANXIETY 30 tablet 2  . meclizine (ANTIVERT) 12.5 MG tablet Take 1 tablet qd prn dizziness 30 tablet 2  . rivaroxaban (XARELTO) 20 MG TABS tablet Take 1 tablet (20 mg total) by mouth daily with supper. 90 tablet 3  . rosuvastatin (CRESTOR) 10 MG tablet TAKE 1 TABLET BY MOUTH EVERY DAY 90 tablet 3  . sertraline (ZOLOFT) 100 MG tablet Take 100 mg by mouth daily.     No current facility-administered medications for this visit.     Allergies-reviewed and updated Allergies  Allergen Reactions  . Hydrocodone-Acetaminophen Shortness Of Breath  . Oxycodone Hcl Shortness Of Breath  . Telmisartan-Hctz Shortness Of Breath and Palpitations    Dizziness, too  . Ramipril   . Aspirin Other (See Comments) and Rash    Other Reaction: confusion Can't take high dose  . Atorvastatin Other (See Comments)    myalgia  . Buspirone Hcl Other (See Comments)    headache  . Codeine Nausea Only and Other (See Comments)    Other Reaction: confusion  . Codeine Phosphate Itching and Nausea Only  . Morphine Sulfate  Itching  . Paroxetine Other (See Comments)    Paresthesias: R arm, R  leg, r side of face  . Rabeprazole Other (See Comments)    headache   Social History   Social History Narrative   Single. No children. Lives alone      Retired from [employer].       Hobbies: travel, Scientist, product/process development, enjoys being outdoors including hiking   Objective  Objective:  BP 112/74 (BP Location: Left Arm, Patient Position: Sitting, Cuff Size: Large)   Pulse 74   Temp 98.1 F (36.7 C) (Oral)   Ht '5\' 10"'$  (1.778 m)   Wt 208 lb (94.3 kg)   SpO2 96%   BMI 29.84 kg/m  Gen: NAD, resting comfortably HEENT: Mucous membranes are moist. Oropharynx normal Neck: no thyromegaly CV: RRR no murmurs rubs or gallops Lungs: CTAB no crackles, wheeze, rhonchi Abdomen: soft/nontender/nondistended/normal bowel sounds. No rebound or guarding.  Ext: no edema, good pulses, wearing a cam walker on right leg Skin: warm, dry Neuro: grossly normal, moves all extremities, PERRLA   Assessment and Plan  74 y.o. male presenting for annual physical.  Health Maintenance counseling: 1. Anticipatory guidance: Patient counseled regarding regular dental exams -q6 months, eye exams -yearly at [facility],  avoiding smoking and second hand smoke , limiting alcohol to 2 beverages per day - not drinking.   2. Risk factor reduction:  Advised patient of need for regular exercise and diet rich and fruits and vegetables to reduce risk of heart attack and stroke. Exercise- still doing tai chi despite his boot. Diet-reasonably healthy diet. Encouraged need for healthy eating, regular exercise, weight loss- perhaps 5-10 lbs (though did have walking boot on with weight today) Wt Readings from Last 3 Encounters:  05/04/19 208 lb (94.3 kg)  04/13/19 207 lb 1.3 oz (93.9 kg)  03/02/19 206 lb 5.6 oz (93.6 kg)  3. Immunizations/screenings/ancillary studies-discussed Shingrix but would defer for now  Immunization History  Administered  Date(s) Administered  . Influenza Split 08/16/2011, 08/14/2012  . Influenza Whole 08/17/2010  . Influenza, High Dose Seasonal PF 08/23/2013, 08/29/2016, 08/11/2017  . Influenza,inj,Quad PF,6+ Mos 09/05/2014, 08/02/2015  . Influenza-Unspecified 06/29/2018  . Pneumococcal Conjugate-13 01/23/2015  . Pneumococcal Polysaccharide-23 03/15/2011  . Td 04/16/2007  . Tdap 04/14/2018  . Zoster 08/16/2011  4. Prostate cancer screening- prostatectomy in 2008.  Patient had previously made decision with Dr. Ernestine Conrad to stop screening PSAs- we have done PSAs to monitor and have been normal- can repeat at follow up - yearly is reasonable Lab Results  Component Value Date   PSA 0.00 (L) 09/29/2018   PSA 0.01 (L) 11/16/2014   PSA 0.00 (L) 03/30/2013   5. Colon cancer screening - March 2014 with 10-year follow-up  6. Skin cancer screening- advised regular sunscreen use. Denies worrisome, changing, or new skin lesions.  7. former smoker- quit 1982. Ct abd/pelvis 2016 with no AAA.   Status of chronic or acute concerns   Bioprosthetic aortic valve-monitored by echocardiogram and stable may 2019 with another one ordered for this year- thinks October.   Migraine prevention-previously on imipramine. Doing ok without that- gets some numbing type headaches but not so severe that he cant tolerate it  CLL-follows with oncology.  Has been stable-continue close oncology follow-up. Regular lab monitoring with them.   Atrial fibrillation- rate controlled without medication- did not tolerate beta blocker with dream issues.  On Xarelto for anticoagulation. -Xarelto also covers for history of recurrent PE-stable without obvious recurrence  Chronic kidney disease stage III- creatinine largely stable and 1.3-1.6 range.  1.34 within  3 weeks- will not repeat at this time.  GFR over 50. No nsaids.   Anxiety state- patient on Zoloft through the [place] and working with Dr. Cheryln Manly  (focuses less on PTSD issues) and the [facility]  (finds them helpful for PTSD)  Hyperlipidemia-compliant with Crestor daily now at '10mg'$ . Discussed briefly going to '20mg'$ - wants to repeat after November and monitor.   Hypertension-controlled on hydrochlorothiazide 12.5 mg    right wrist issues- did not improve substantially on prednisone with Dr. Junius Roads- is going to see a hand sjpecialist.   Allergy issues- uses nasal saline for sinus pressure. Does not do well with irrigation. Nasal wheezing.   Vertigo- refill meclizine  Wants to schedule one-year visit-discussed this would place back lipid panel and PSA some but that is likely reasonable-he will let us know if he has any acute concerns Future Appointments  Date Time Provider [place]  05/20/2019  1:00 PM Oren Binet, PhD [facility] None  08/17/2019 11:30 AM [facility] None  08/17/2019 12:00 PM Ennever, Rudell Cobb, MD [facility] None   Lab/Order associations: no labs today    ICD-10-CM   1. Insomnia, unspecified type  G47.00    Meds ordered this encounter  Medications  . meclizine (ANTIVERT) 12.5 MG tablet    Sig: Take 1 tablet qd prn dizziness    Dispense:  30 tablet    Refill:  2   Return precautions advised.  Garret Reddish, MD

## 2019-05-20 ENCOUNTER — Ambulatory Visit (INDEPENDENT_AMBULATORY_CARE_PROVIDER_SITE_OTHER): Payer: Medicare Other | Admitting: Psychology

## 2019-05-20 DIAGNOSIS — F4321 Adjustment disorder with depressed mood: Secondary | ICD-10-CM

## 2019-05-24 ENCOUNTER — Encounter: Payer: Self-pay | Admitting: Family Medicine

## 2019-05-24 DIAGNOSIS — M79641 Pain in right hand: Secondary | ICD-10-CM

## 2019-05-24 NOTE — Addendum Note (Signed)
Addended by: Hortencia Pilar on: 05/24/2019 09:01 AM   Modules accepted: Orders

## 2019-05-25 ENCOUNTER — Telehealth: Payer: Self-pay | Admitting: *Deleted

## 2019-05-25 NOTE — Telephone Encounter (Signed)
I talked to [facility] to get her take on the appt PA.  She said they have to send this to [name] to get approval - they aren't allowed to schedule.  That part confused me until I asked - you and I see PA and we think medications, insurance companies, etc.  I see where Dr Junius Roads put an order in for a hand xray - I don't see an order for a shoulder xray.  I imagine he would go to [place] to get that done?  But per the message, I guess he wants to discuss with Dr Yong Channel first.  And maybe add a  shoulder xray too?  I guess this needs to go to the admin pool for scheduling.

## 2019-05-25 NOTE — Telephone Encounter (Signed)
Copied from [place] [phone]. Topic: Appointment Scheduling - Prior Auth Required for Appointment >> May 25, 2019  7:34 AM Robina Ade, Helene Kelp D wrote: No appointment has been scheduled. Patient is requesting a same day appointment to talk about havin x-ray done for hand & shoulder. Per scheduling protocol, this appointment requires a prior authorization prior to scheduling.  Route to department's PEC pool.

## 2019-05-25 NOTE — Telephone Encounter (Signed)
Hello, can you look into this I am not sure what it means or what pt needs. Thanks

## 2019-05-25 NOTE — Telephone Encounter (Unsigned)
Copied from [place] [id number]. Topic: Appointment Scheduling - Prior Auth Required for Appointment >> May 25, 2019  7:34 AM Robina Ade, Helene Kelp D wrote: No appointment has been scheduled. Patient is requesting a same day appointment to talk about havin x-ray done for hand & shoulder. Per scheduling protocol, this appointment requires a prior authorization prior to scheduling.  Route to department's PEC pool.

## 2019-05-26 ENCOUNTER — Other Ambulatory Visit: Payer: Self-pay

## 2019-05-26 ENCOUNTER — Ambulatory Visit (INDEPENDENT_AMBULATORY_CARE_PROVIDER_SITE_OTHER): Payer: Medicare Other

## 2019-05-26 ENCOUNTER — Telehealth: Payer: Self-pay | Admitting: Family Medicine

## 2019-05-26 ENCOUNTER — Other Ambulatory Visit: Payer: Medicare Other

## 2019-05-26 DIAGNOSIS — M25511 Pain in right shoulder: Secondary | ICD-10-CM

## 2019-05-26 DIAGNOSIS — M79641 Pain in right hand: Secondary | ICD-10-CM | POA: Diagnosis not present

## 2019-05-26 DIAGNOSIS — G8929 Other chronic pain: Secondary | ICD-10-CM

## 2019-05-26 NOTE — Telephone Encounter (Signed)
Called and spoke with patient. He just needs to come here to get XR of the R hand ordered by Dr. Junius Roads. Pt is on his way to the office now for XR.

## 2019-05-26 NOTE — Telephone Encounter (Signed)
Hand x-rays show early arthritic change in the scapholunate joint.  No other bony abnormality seen.  Shoulder x-rays show moderate AC joint arthritis, and mild to moderate glenohumeral joint arthritis.  There appears to be shrapnel in the soft tissues.

## 2019-05-27 NOTE — Addendum Note (Signed)
Addended by: Hortencia Pilar on: 05/27/2019 08:21 AM   Modules accepted: Orders

## 2019-06-16 ENCOUNTER — Ambulatory Visit (INDEPENDENT_AMBULATORY_CARE_PROVIDER_SITE_OTHER): Payer: Medicare Other | Admitting: Physical Medicine and Rehabilitation

## 2019-06-16 ENCOUNTER — Other Ambulatory Visit: Payer: Self-pay

## 2019-06-16 ENCOUNTER — Other Ambulatory Visit: Payer: Self-pay | Admitting: Family Medicine

## 2019-06-16 DIAGNOSIS — R202 Paresthesia of skin: Secondary | ICD-10-CM | POA: Diagnosis not present

## 2019-06-16 NOTE — Progress Notes (Signed)
.  Numeric Pain Rating Scale and Functional Assessment Average Pain 10   In the last MONTH (on 0-10 scale) has pain interfered with the following?  1. General activity like being  able to carry out your everyday physical activities such as walking, climbing stairs, carrying groceries, or moving a chair?  Rating(8)    

## 2019-06-16 NOTE — Telephone Encounter (Signed)
Last OV 05/04/19 Last refill 03/01/19 #30/2 Next OV not scheduled   Forwarding to Dr. Yong Channel

## 2019-06-17 ENCOUNTER — Encounter: Payer: Self-pay | Admitting: Family Medicine

## 2019-06-17 ENCOUNTER — Ambulatory Visit (INDEPENDENT_AMBULATORY_CARE_PROVIDER_SITE_OTHER): Payer: Medicare Other | Admitting: Psychology

## 2019-06-17 DIAGNOSIS — F4321 Adjustment disorder with depressed mood: Secondary | ICD-10-CM

## 2019-06-17 NOTE — Progress Notes (Signed)
[name] - 73 y.o. male MRN [id number]  Date of birth: 1946/02/08  Office Visit Note: Visit Date: 06/16/2019 PCP: [name], MD Referred by: [name], MD  Subjective: Chief Complaint  Patient presents with  . Right Elbow - Pain  . Right Wrist - Pain  . Right Hand - Pain, Numbness   HPI:  [name] is a 73 y.o. male who comes in today At the request of Dr. Legrand Como hilts for electrodiagnostic study of the right upper limb.  Patient reports a few months history of right elbow and right wrist pain with pressure.  He is right-hand dominant.  He does endorse some numbness but somewhat nondermatomal in the middle fourth and fifth digits.  He reports constant symptoms but does report some relief wearing a brace.  He reports his pain is 10 out of 10.  He reports he does suffer from anxiety.  He does do alternative treatments such as acupuncture and that does help to a degree.  He is not having frank radicular complaints.  No prior electrodiagnostic studies.  ROS Otherwise per HPI.  Assessment & Plan: Visit Diagnoses:  1. Paresthesia of skin     Plan: Impression: The above electrodiagnostic study is ABNORMAL and reveals evidence of a moderate right median nerve entrapment at the wrist (carpal tunnel syndrome) affecting sensory and motor components.   There is no significant electrodiagnostic evidence of any other focal nerve entrapment, brachial plexopathy or cervical radiculopathy.  **Carefull clinical correlation to symptoms is paramount in this case.  Recommendations: 1.  Follow-up with referring physician. 2.  Continue current management of symptoms. 3.  Continue use of resting splint at night-time and as needed during the day.   Meds & Orders: No orders of the defined types were placed in this encounter.   Orders Placed This Encounter  Procedures  . NCV with EMG (electromyography)    Follow-up: Return for Eunice Blase, MD.   Procedures: No  procedures performed  EMG & NCV Findings: Evaluation of the right median motor nerve showed prolonged distal onset latency (4.5 ms).  The right median (across palm) sensory nerve showed prolonged distal peak latency (4.3 ms).  All remaining nerves (as indicated in the following tables) were within normal limits.    All examined muscles (as indicated in the following table) showed no evidence of electrical instability.    Impression: The above electrodiagnostic study is ABNORMAL and reveals evidence of a moderate right median nerve entrapment at the wrist (carpal tunnel syndrome) affecting sensory and motor components.   There is no significant electrodiagnostic evidence of any other focal nerve entrapment, brachial plexopathy or cervical radiculopathy.  **Carefull clinical correlation to symptoms is paramount in this case.  Recommendations: 1.  Follow-up with referring physician. 2.  Continue current management of symptoms. 3.  Continue use of resting splint at night-time and as needed during the day.  ___________________________ Wonda Olds Board Certified, American Board of Physical Medicine and Rehabilitation    Nerve Conduction Studies Anti Sensory Summary Table   Stim Site NR Peak (ms) Norm Peak (ms) P-T Amp (V) Norm P-T Amp Site1 Site2 Delta-P (ms) Dist (cm) Vel (m/s) Norm Vel (m/s)  Right Median Acr Palm Anti Sensory (2nd Digit)  31.9C  Wrist    *4.3 <3.6 23.3 >10        Site 3    1.8  26.2         Right Radial Anti Sensory (Base 1st  Digit)  31.6C  Wrist    2.3 <3.1 25.8  Wrist Base 1st Digit 2.3 0.0    Right Ulnar Anti Sensory (5th Digit)  31.8C  Wrist    3.4 <3.7 21.8 >15.0 Wrist 5th Digit 3.4 14.0 41 >38   Motor Summary Table   Stim Site NR Onset (ms) Norm Onset (ms) O-P Amp (mV) Norm O-P Amp Site1 Site2 Delta-0 (ms) Dist (cm) Vel (m/s) Norm Vel (m/s)  Right Median Motor (Abd Poll Brev)  31.8C  Wrist    *4.5 <4.2 5.7 >5 Elbow Wrist 5.1 26.0 51 >50  Elbow     9.6  5.4         Right Ulnar Motor (Abd Dig Min)  31.6C  Wrist    3.2 <4.2 8.1 >3 B Elbow Wrist 4.3 25.0 58 >53  B Elbow    7.5  6.7  A Elbow B Elbow 1.8 10.0 56 >53  A Elbow    9.3  8.1          EMG   Side Muscle Nerve Root Ins Act Fibs Psw Amp Dur Poly Recrt Int Fraser Din Comment  Right Abd Poll Brev Median C8-T1 Nml Nml Nml Nml Nml 0 Nml Nml   Right 1stDorInt Ulnar C8-T1 Nml Nml Nml Nml Nml 0 Nml Nml   Right PronatorTeres Median C6-7 Nml Nml Nml Nml Nml 0 Nml Nml   Right Biceps Musculocut C5-6 Nml Nml Nml Nml Nml 0 Nml Nml   Right Deltoid Axillary C5-6 Nml Nml Nml Nml Nml 0 Nml Nml     Nerve Conduction Studies Anti Sensory Left/Right Comparison   Stim Site L Lat (ms) R Lat (ms) L-R Lat (ms) L Amp (V) R Amp (V) L-R Amp (%) Site1 Site2 L Vel (m/s) R Vel (m/s) L-R Vel (m/s)  Median Acr Palm Anti Sensory (2nd Digit)  31.9C  Wrist  *4.3   23.3        Site 3  1.8   26.2        Radial Anti Sensory (Base 1st Digit)  31.6C  Wrist  2.3   25.8  Wrist Base 1st Digit     Ulnar Anti Sensory (5th Digit)  31.8C  Wrist  3.4   21.8  Wrist 5th Digit  41    Motor Left/Right Comparison   Stim Site L Lat (ms) R Lat (ms) L-R Lat (ms) L Amp (mV) R Amp (mV) L-R Amp (%) Site1 Site2 L Vel (m/s) R Vel (m/s) L-R Vel (m/s)  Median Motor (Abd Poll Brev)  31.8C  Wrist  *4.5   5.7  Elbow Wrist  51   Elbow  9.6   5.4        Ulnar Motor (Abd Dig Min)  31.6C  Wrist  3.2   8.1  B Elbow Wrist  58   B Elbow  7.5   6.7  A Elbow B Elbow  56   A Elbow  9.3   8.1           Waveforms:            Clinical History: No specialty comments available.     Objective:  VS:  HT:    WT:   BMI:     BP:   HR: bpm  TEMP: ( )  RESP:  Physical Exam Vitals signs and nursing note reviewed.  Musculoskeletal:        General: No tenderness.     Comments: Inspection reveals no  atrophy of the bilateral APB or FDI or hand intrinsics. There is no swelling, color changes, allodynia or dystrophic changes. There is  5 out of 5 strength in the bilateral wrist extension, finger abduction and long finger flexion. There is intact sensation to light touch in all dermatomal and peripheral nerve distributions.  There is a negative Hoffmann's test bilaterally.  Skin:    General: Skin is warm and dry.     Findings: No erythema or rash.  Neurological:     General: No focal deficit present.     Mental Status: He is alert and oriented to person, place, and time.     Sensory: No sensory deficit.     Motor: No weakness or abnormal muscle tone.     Coordination: Coordination normal.     Gait: Gait normal.  Psychiatric:        Mood and Affect: Mood normal.        Behavior: Behavior normal.        Thought Content: Thought content normal.     Ortho Exam Imaging: No results found.

## 2019-06-17 NOTE — Procedures (Signed)
EMG & NCV Findings: Evaluation of the right median motor nerve showed prolonged distal onset latency (4.5 ms).  The right median (across palm) sensory nerve showed prolonged distal peak latency (4.3 ms).  All remaining nerves (as indicated in the following tables) were within normal limits.    All examined muscles (as indicated in the following table) showed no evidence of electrical instability.    Impression: The above electrodiagnostic study is ABNORMAL and reveals evidence of a moderate right median nerve entrapment at the wrist (carpal tunnel syndrome) affecting sensory and motor components.   There is no significant electrodiagnostic evidence of any other focal nerve entrapment, brachial plexopathy or cervical radiculopathy.  **Carefull clinical correlation to symptoms is paramount in this case.  Recommendations: 1.  Follow-up with referring physician. 2.  Continue current management of symptoms. 3.  Continue use of resting splint at night-time and as needed during the day.  ___________________________ Wonda Olds Board Certified, American Board of Physical Medicine and Rehabilitation    Nerve Conduction Studies Anti Sensory Summary Table   Stim Site NR Peak (ms) Norm Peak (ms) P-T Amp (V) Norm P-T Amp Site1 Site2 Delta-P (ms) Dist (cm) Vel (m/s) Norm Vel (m/s)  Right Median Acr Palm Anti Sensory (2nd Digit)  31.9C  Wrist    *4.3 <3.6 23.3 >10        Site 3    1.8  26.2         Right Radial Anti Sensory (Base 1st Digit)  31.6C  Wrist    2.3 <3.1 25.8  Wrist Base 1st Digit 2.3 0.0    Right Ulnar Anti Sensory (5th Digit)  31.8C  Wrist    3.4 <3.7 21.8 >15.0 Wrist 5th Digit 3.4 14.0 41 >38   Motor Summary Table   Stim Site NR Onset (ms) Norm Onset (ms) O-P Amp (mV) Norm O-P Amp Site1 Site2 Delta-0 (ms) Dist (cm) Vel (m/s) Norm Vel (m/s)  Right Median Motor (Abd Poll Brev)  31.8C  Wrist    *4.5 <4.2 5.7 >5 Elbow Wrist 5.1 26.0 51 >50  Elbow    9.6  5.4          Right Ulnar Motor (Abd Dig Min)  31.6C  Wrist    3.2 <4.2 8.1 >3 B Elbow Wrist 4.3 25.0 58 >53  B Elbow    7.5  6.7  A Elbow B Elbow 1.8 10.0 56 >53  A Elbow    9.3  8.1          EMG   Side Muscle Nerve Root Ins Act Fibs Psw Amp Dur Poly Recrt Int Fraser Din Comment  Right Abd Poll Brev Median C8-T1 Nml Nml Nml Nml Nml 0 Nml Nml   Right 1stDorInt Ulnar C8-T1 Nml Nml Nml Nml Nml 0 Nml Nml   Right PronatorTeres Median C6-7 Nml Nml Nml Nml Nml 0 Nml Nml   Right Biceps Musculocut C5-6 Nml Nml Nml Nml Nml 0 Nml Nml   Right Deltoid Axillary C5-6 Nml Nml Nml Nml Nml 0 Nml Nml     Nerve Conduction Studies Anti Sensory Left/Right Comparison   Stim Site L Lat (ms) R Lat (ms) L-R Lat (ms) L Amp (V) R Amp (V) L-R Amp (%) Site1 Site2 L Vel (m/s) R Vel (m/s) L-R Vel (m/s)  Median Acr Palm Anti Sensory (2nd Digit)  31.9C  Wrist  *4.3   23.3        Site 3  1.8   26.2  Radial Anti Sensory (Base 1st Digit)  31.6C  Wrist  2.3   25.8  Wrist Base 1st Digit     Ulnar Anti Sensory (5th Digit)  31.8C  Wrist  3.4   21.8  Wrist 5th Digit  41    Motor Left/Right Comparison   Stim Site L Lat (ms) R Lat (ms) L-R Lat (ms) L Amp (mV) R Amp (mV) L-R Amp (%) Site1 Site2 L Vel (m/s) R Vel (m/s) L-R Vel (m/s)  Median Motor (Abd Poll Brev)  31.8C  Wrist  *4.5   5.7  Elbow Wrist  51   Elbow  9.6   5.4        Ulnar Motor (Abd Dig Min)  31.6C  Wrist  3.2   8.1  B Elbow Wrist  58   B Elbow  7.5   6.7  A Elbow B Elbow  56   A Elbow  9.3   8.1           Waveforms:

## 2019-06-18 ENCOUNTER — Encounter: Payer: Self-pay | Admitting: Family Medicine

## 2019-06-21 ENCOUNTER — Encounter: Payer: Self-pay | Admitting: Family Medicine

## 2019-06-21 ENCOUNTER — Ambulatory Visit (INDEPENDENT_AMBULATORY_CARE_PROVIDER_SITE_OTHER): Payer: Medicare Other | Admitting: Family Medicine

## 2019-06-21 DIAGNOSIS — M79641 Pain in right hand: Secondary | ICD-10-CM

## 2019-06-21 NOTE — Progress Notes (Signed)
Office Visit Note   Patient: [name]           Date of Birth: 05-Jan-1946           MRN: [id number] Visit Date: 06/21/2019 Requested by: [name], MD [place],   [id number] PCP: [name], MD  Subjective: Chief Complaint  Patient presents with  . Right Arm - Follow-up    S/p EMG/NCS    HPI: He is here for follow-up right hand and wrist pain.  Nerve study showed moderate carpal tunnel syndrome.  Still having pain, especially when putting pressure on his hand with his wrist dorsiflexed.              ROS: No fever or chills.  All other systems were reviewed and are negative.  Objective: Vital Signs: There were no vitals taken for this visit.  Physical Exam:  General:  Alert and oriented, in no acute distress. Pulm:  Breathing unlabored. Psy:  Normal mood, congruent affect. Skin: No rash on the skin. Right wrist: He has pain with flexion of the wrist against resistance, but only when I let go, not during resistance.  Positive Tinel's over carpal tunnel, positive Phalen's test but the Phalen's test is normally.He has pain, only numbness in his fingers.  Imaging: None today.  Assessment & Plan: 1.  Right wrist pain, with confirmed moderate carpal tunnel syndrome but I do not think all of his pain is coming from that.  He could have ligament damage in his wrist or possibly DJD causing some of his pain. -We discussed treatment options, he wants to continue with hand therapy.  If he fails to improve, then a one-time carpal tunnel injection.     Procedures: No procedures performed  No notes on file     PMFS History: Patient Active Problem List   Diagnosis Date Noted  . PTSD (post-traumatic stress disorder) 09/29/2018  . BPPV (benign paroxysmal positional vertigo) 09/04/2017  . CKD (chronic kidney disease), stage III ([place]) 08/11/2017  . Former smoker 08/29/2016  . Cervical spondylosis   . S/P Bioprosthetic AVR (aortic valve  replacement) in 2011 03/22/2015  . Recurrent pulmonary embolism ([place]) 11/09/2014  . Lung nodule 11/02/2014  . Chronic lymphocytic leukemia ([place]) 11/11/2013  . Migraine 10/08/2010  . GERD 12/11/2009  . MITRAL REGURGITATION 04/07/2009  . Insomnia 02/03/2008  . Anxiety state 09/06/2007  . HLD (hyperlipidemia) 04/20/2007  . Essential hypertension 04/20/2007  . Atrial fibrillation ([place]) 04/20/2007  . PROSTATE CANCER, HX OF 04/20/2007   Past Medical History:  Diagnosis Date  . Anticoagulants causing adverse effect in therapeutic use   . Anxiety    Paxil seemed to cause odd neuro side effects; better on prozac as of 09/2015  . Aortic regurgitation 04/2007 surgery   Resolved with tissue AVR at [facility]  . Cervical spondylosis    ESI by [name] in Ortho. 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 Chronic lymphocytic leukemia (CLL), B-cell ([place]) approx 2012   stable on f/u's with [name] (most recent 07/2017)  . History of prostate cancer 2003   Rad prost  . History of pulmonary embolism 2011; 10/2014   Recurrence off of anticoagulation 10/2014: per hem/onc ([name]) pt needs lifelong anticoagulation (hypercoag w/u neg).  . Hyperlipidemia    statin-intolerant, except for crestor low dose.  Marland Kitchen Hypertension   . Migraine syndrome   . Mitral regurgitation   . PAF (paroxysmal atrial fibrillation) ([place])   .  Panic attacks    "           "             "                  "                  "                       "                             "                         . Pulmonary nodule, right 2015   RLL: resected at [facility] --VATS; Benign RLL nodule (fungal and AFB stains neg).  Plan is for [facility] thoracic surg to do f/u o/v & CT chest 1 yr   . Right-sided headache 07/2016   Primary stabbing HA or migraine per neuro (Dr. Tomi Likens).  Gabapentin and topamax not tolerated.  These resolved spontaneously.    Family History  Problem Relation Age of Onset  . Cancer Sister        uterine  . Colon cancer Sister  56  . Stroke Mother   . Prostate cancer Father   . Stroke Sister   . Stroke Brother   . Heart attack Neg Hx     Past Surgical History:  Procedure Laterality Date  . AORTIC VALVE REPLACEMENT  2011   [facility]  (bioprosthetic)  . CARDIAC CATHETERIZATION  04/2010   Normal coronaries.  . Carotid dopplers  08/2015   NORMAL  . CATARACT EXTRACTION     L 12/06/09   R 09/14/09  . COLONOSCOPY  01/26/13   tic's, o/w normal.  ([place])--recall 10 yrs.  Marland Kitchen PENILE PROSTHESIS IMPLANT    . PROSTATECTOMY  04/2002   for prostate cancer  . Pulmonary nodule resection  Fall/winter 2016   [facility]  . SHOULDER SURGERY     rotator cuff on both arms   . TIBIALIS TENDON TRANSFER / REPAIR Right 03/02/2019   Procedure: RECONSTRUCTION OF RIGHT  ANTERIOR TIBIALIS TENDON;  Surgeon: Erle Crocker, MD;  Location: [facility];  Service: Orthopedics;  Laterality: Right;  . TRANSTHORACIC ECHOCARDIOGRAM  09/20/2014; 03/2016; 03/2017   2015-mild LVH, EF 50-55%, wall motion nl, grade I diast dysfxn, prosth aort valve good,transaortic gradients decreased compared to echo 11/2013.   03/2016: EF 55-60%, normal LV function, severe LVH, normal diast fxn, mild increase in AV gradient compared to 09/2014 echo.  2018: EF 55-60%, normal LV fxn, grd II DD, AV stable/gradient's stable.   Social History   Occupational History    Comment: Retired - Tobacco   Tobacco Use  . Smoking status: Former Smoker    Packs/day: 1.00    Years: 12.00    Pack years: 12.00    Types: Cigarettes    Start date: 11/28/1968    Quit date: 01/29/1981    Years since quitting: 38.4  . Smokeless tobacco: Never Used  . Tobacco comment: quit 73 yo   Substance and Sexual Activity  . Alcohol use: Yes    Alcohol/week: 0.0 standard drinks    Comment: occasional  . Drug use: No  . Sexual activity: Not on file

## 2019-07-05 ENCOUNTER — Encounter: Payer: Self-pay | Admitting: Family Medicine

## 2019-07-07 ENCOUNTER — Encounter: Payer: Self-pay | Admitting: Family Medicine

## 2019-07-07 ENCOUNTER — Ambulatory Visit (INDEPENDENT_AMBULATORY_CARE_PROVIDER_SITE_OTHER): Payer: Medicare Other | Admitting: Family Medicine

## 2019-07-07 DIAGNOSIS — M25521 Pain in right elbow: Secondary | ICD-10-CM

## 2019-07-07 DIAGNOSIS — M7021 Olecranon bursitis, right elbow: Secondary | ICD-10-CM | POA: Diagnosis not present

## 2019-07-07 DIAGNOSIS — M25522 Pain in left elbow: Secondary | ICD-10-CM

## 2019-07-07 DIAGNOSIS — M7022 Olecranon bursitis, left elbow: Secondary | ICD-10-CM | POA: Diagnosis not present

## 2019-07-07 NOTE — Progress Notes (Signed)
Office Visit Note   Patient: [name]           Date of Birth: 1946/04/08           MRN: [id number] Visit Date: 07/07/2019 Requested by: [name], MD [place],  [place] [id number] PCP: [name], MD  Subjective: Chief Complaint  Patient presents with  . Right Elbow - Pain  . Left Elbow - Pain    HPI: He is here with bilateral elbow pain.  He is noticed swelling on the back of his elbows for about 3 weeks, no definite injury.  Denies fevers or chills.  He has not tried anything to make it go away.              ROS:   All other systems were reviewed and are negative.  Objective: Vital Signs: There were no vitals taken for this visit.  Physical Exam:  General:  Alert and oriented, in no acute distress. Pulm:  Breathing unlabored. Psy:  Normal mood, congruent affect. Skin: No rash or erythema. Elbow elbows: He has bilateral 1+ olecranon bursal swelling with no warmth.  Full range of motion of the elbows, mild tenderness over the bursa.  Imaging: None today.  Assessment & Plan: 1.  Bilateral aseptic olecranon bursitis -Discussed options with him and he wants to try aspiration and injection.  He will follow-up as needed.    Procedures: Bilateral elbow aspiration and injection: After sterile prep with Betadine, injected 2-1/2 cc 1% lidocaine without epinephrine and then aspirated 12 cc from the right and 9 cc from the left, then injected 20 mg of methylprednisolone into each.   PMFS History: Patient Active Problem List   Diagnosis Date Noted  . PTSD (post-traumatic stress disorder) 09/29/2018  . BPPV (benign paroxysmal positional vertigo) 09/04/2017  . CKD (chronic kidney disease), stage III ([place]) 08/11/2017  . Former smoker 08/29/2016  . Cervical spondylosis   . S/P Bioprosthetic AVR (aortic valve replacement) in 2011 03/22/2015  . Recurrent pulmonary embolism ([place]) 11/09/2014  . Lung nodule 11/02/2014  . Chronic lymphocytic  leukemia ([place]) 11/11/2013  . Migraine 10/08/2010  . GERD 12/11/2009  . MITRAL REGURGITATION 04/07/2009  . Insomnia 02/03/2008  . Anxiety state 09/06/2007  . HLD (hyperlipidemia) 04/20/2007  . Essential hypertension 04/20/2007  . Atrial fibrillation ([place]) 04/20/2007  . PROSTATE CANCER, HX OF 04/20/2007   Past Medical History:  Diagnosis Date  . Anticoagulants causing adverse effect in therapeutic use   . Anxiety    Paxil seemed to cause odd neuro side effects; better on prozac as of 09/2015  . Aortic regurgitation 04/2007 surgery   Resolved with tissue AVR at [facility]  . Cervical spondylosis    ESI by [name] in Ortho. 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 Chronic lymphocytic leukemia (CLL), B-cell ([place]) approx 2012   stable on f/u's with [name] (most recent 07/2017)  . History of prostate cancer 2003   Rad prost  . History of pulmonary embolism 2011; 10/2014   Recurrence off of anticoagulation 10/2014: per hem/onc ([name]) pt needs lifelong anticoagulation (hypercoag w/u neg).  . Hyperlipidemia    statin-intolerant, except for crestor low dose.  Marland Kitchen Hypertension   . Migraine syndrome   . Mitral regurgitation   . PAF (paroxysmal atrial fibrillation) ([place])   . Panic attacks    "           "             "                  "                  "                       "                             "                         .  Pulmonary nodule, right 2015   RLL: resected at [facility] --VATS; Benign RLL nodule (fungal and AFB stains neg).  Plan is for [facility] thoracic surg to do f/u o/v & CT chest 1 yr   . Right-sided headache 07/2016   Primary stabbing HA or migraine per neuro (Dr. Tomi Likens).  Gabapentin and topamax not tolerated.  These resolved spontaneously.    Family History  Problem Relation Age of Onset  . Cancer Sister        uterine  . Colon cancer Sister 30  . Stroke Mother   . Prostate cancer Father   . Stroke Sister   . Stroke Brother   . Heart attack Neg Hx     Past Surgical  History:  Procedure Laterality Date  . AORTIC VALVE REPLACEMENT  2011   [facility]  (bioprosthetic)  . CARDIAC CATHETERIZATION  04/2010   Normal coronaries.  . Carotid dopplers  08/2015   NORMAL  . CATARACT EXTRACTION     L 12/06/09   R 09/14/09  . COLONOSCOPY  01/26/13   tic's, o/w normal.  ([place])--recall 10 yrs.  Marland Kitchen PENILE PROSTHESIS IMPLANT    . PROSTATECTOMY  04/2002   for prostate cancer  . Pulmonary nodule resection  Fall/winter 2016   [facility]  . SHOULDER SURGERY     rotator cuff on both arms   . TIBIALIS TENDON TRANSFER / REPAIR Right 03/02/2019   Procedure: RECONSTRUCTION OF RIGHT  ANTERIOR TIBIALIS TENDON;  Surgeon: Erle Crocker, MD;  Location: [facility];  Service: Orthopedics;  Laterality: Right;  . TRANSTHORACIC ECHOCARDIOGRAM  09/20/2014; 03/2016; 03/2017   2015-mild LVH, EF 50-55%, wall motion nl, grade I diast dysfxn, prosth aort valve good,transaortic gradients decreased compared to echo 11/2013.   03/2016: EF 55-60%, normal LV function, severe LVH, normal diast fxn, mild increase in AV gradient compared to 09/2014 echo.  2018: EF 55-60%, normal LV fxn, grd II DD, AV stable/gradient's stable.   Social History   Occupational History    Comment: Retired - Tobacco   Tobacco Use  . Smoking status: Former Smoker    Packs/day: 1.00    Years: 12.00    Pack years: 12.00    Types: Cigarettes    Start date: 11/28/1968    Quit date: 01/29/1981    Years since quitting: 38.4  . Smokeless tobacco: Never Used  . Tobacco comment: quit 73 yo   Substance and Sexual Activity  . Alcohol use: Yes    Alcohol/week: 0.0 standard drinks    Comment: occasional  . Drug use: No  . Sexual activity: Not on file

## 2019-07-08 ENCOUNTER — Ambulatory Visit: Payer: Medicare Other | Admitting: Family Medicine

## 2019-07-15 ENCOUNTER — Ambulatory Visit (INDEPENDENT_AMBULATORY_CARE_PROVIDER_SITE_OTHER): Payer: Medicare Other | Admitting: Psychology

## 2019-07-15 DIAGNOSIS — F4321 Adjustment disorder with depressed mood: Secondary | ICD-10-CM | POA: Diagnosis not present

## 2019-08-05 ENCOUNTER — Other Ambulatory Visit: Payer: Self-pay | Admitting: Family Medicine

## 2019-08-11 ENCOUNTER — Telehealth: Payer: Self-pay | Admitting: Hematology & Oncology

## 2019-08-11 NOTE — Telephone Encounter (Signed)
Called and LMVM for patient regarding date/time change of appts.  Moved from 10/6 to 10/5 / provider out of office early per 9/30 secure chat

## 2019-08-12 ENCOUNTER — Ambulatory Visit: Payer: Medicare Other | Admitting: Psychology

## 2019-08-16 ENCOUNTER — Inpatient Hospital Stay: Payer: Medicare Other | Attending: Hematology & Oncology

## 2019-08-16 ENCOUNTER — Inpatient Hospital Stay (HOSPITAL_BASED_OUTPATIENT_CLINIC_OR_DEPARTMENT_OTHER): Payer: Medicare Other | Admitting: Hematology & Oncology

## 2019-08-16 ENCOUNTER — Encounter: Payer: Self-pay | Admitting: Hematology & Oncology

## 2019-08-16 ENCOUNTER — Other Ambulatory Visit: Payer: Self-pay

## 2019-08-16 VITALS — BP 141/84 | HR 62 | Temp 97.3°F | Resp 18 | Wt 205.0 lb

## 2019-08-16 DIAGNOSIS — C9110 Chronic lymphocytic leukemia of B-cell type not having achieved remission: Secondary | ICD-10-CM | POA: Diagnosis not present

## 2019-08-16 DIAGNOSIS — I2699 Other pulmonary embolism without acute cor pulmonale: Secondary | ICD-10-CM | POA: Insufficient documentation

## 2019-08-16 DIAGNOSIS — Z7901 Long term (current) use of anticoagulants: Secondary | ICD-10-CM | POA: Insufficient documentation

## 2019-08-16 LAB — CBC WITH DIFFERENTIAL (CANCER CENTER ONLY)
Abs Immature Granulocytes: 0.03 10*3/uL (ref 0.00–0.07)
Basophils Absolute: 0 10*3/uL (ref 0.0–0.1)
Basophils Relative: 0 %
Eosinophils Absolute: 0.1 10*3/uL (ref 0.0–0.5)
Eosinophils Relative: 1 %
HCT: 40.5 % (ref 39.0–52.0)
Hemoglobin: 13.5 g/dL (ref 13.0–17.0)
Immature Granulocytes: 0 %
Lymphocytes Relative: 81 %
Lymphs Abs: 11.3 10*3/uL — ABNORMAL HIGH (ref 0.7–4.0)
MCH: 32.6 pg (ref 26.0–34.0)
MCHC: 33.3 g/dL (ref 30.0–36.0)
MCV: 97.8 fL (ref 80.0–100.0)
Monocytes Absolute: 1.1 10*3/uL — ABNORMAL HIGH (ref 0.1–1.0)
Monocytes Relative: 8 %
Neutro Abs: 1.4 10*3/uL — ABNORMAL LOW (ref 1.7–7.7)
Neutrophils Relative %: 10 %
Platelet Count: 105 10*3/uL — ABNORMAL LOW (ref 150–400)
RBC: 4.14 MIL/uL — ABNORMAL LOW (ref 4.22–5.81)
RDW: 13 % (ref 11.5–15.5)
WBC Count: 13.9 10*3/uL — ABNORMAL HIGH (ref 4.0–10.5)
nRBC: 0 % (ref 0.0–0.2)

## 2019-08-16 LAB — CMP (CANCER CENTER ONLY)
ALT: 12 U/L (ref 0–44)
AST: 15 U/L (ref 15–41)
Albumin: 4.6 g/dL (ref 3.5–5.0)
Alkaline Phosphatase: 50 U/L (ref 38–126)
Anion gap: 8 (ref 5–15)
BUN: 19 mg/dL (ref 8–23)
CO2: 25 mmol/L (ref 22–32)
Calcium: 9.4 mg/dL (ref 8.9–10.3)
Chloride: 105 mmol/L (ref 98–111)
Creatinine: 1.34 mg/dL — ABNORMAL HIGH (ref 0.61–1.24)
GFR, Est AFR Am: >60 mL/min (ref >60)
GFR, Estimated: 52 mL/min — ABNORMAL LOW (ref >60)
Glucose, Bld: 96 mg/dL (ref 70–99)
Potassium: 3.9 mmol/L (ref 3.5–5.1)
Sodium: 138 mmol/L (ref 135–145)
Total Bilirubin: 0.5 mg/dL (ref 0.3–1.2)
Total Protein: 6.8 g/dL (ref 6.5–8.1)

## 2019-08-16 LAB — LACTATE DEHYDROGENASE: LDH: 336 U/L — ABNORMAL HIGH (ref 98–192)

## 2019-08-16 NOTE — Progress Notes (Signed)
Hematology and Oncology Follow Up Visit  [name] [id number] 10/10/1946 73 y.o. 08/16/2019   Principle Diagnosis:   Right lower lobe pulmonary nodule- non-malignant  Recurrent pulmonary embolism  CLL -- progressive -- 11q-/13q-  Current Therapy:    Xarelto 20 mg by mouth daily       Rituxan/bendamustine-s/p cycle #2 - given 06/18/2017      Interim History:  Mr.  Bell is back for follow-up.  Unfortunately, I do think that he is now beginning to progress.  His white cell count has doubled since we last saw him.  His percentage of lymphocytes is up to 81%.  His platelet count is also slowly dropping.  He feels well.  He is had no complaints since we last saw him.  He has had no issues with nausea or vomiting.  He has had no fever.  He has had no swollen lymph nodes what he can tell.  He has had no change in bowel or bladder habits.  He  does have the mechanical cardiac valve.  He does see cardiology for this.  He has had no cardiac issues.  He is on Xarelto and doing well with this.  He has not noted any problems with weight loss.  Overall, his performance status is ECOG 0.  Medications:  Current Outpatient Medications:  .  acetaminophen (TYLENOL) 500 MG tablet, Take 2 tablets (1,000 mg total) by mouth every 6 (six) hours as needed., Disp: 100 tablet, Rfl: 2 .  cholecalciferol (VITAMIN D) 1000 units tablet, Take 1,000 Units by mouth daily., Disp: , Rfl:  .  hydrochlorothiazide (HYDRODIURIL) 12.5 MG tablet, TAKE 1 TABLET BY MOUTH EVERY DAY, Disp: 90 tablet, Rfl: 1 .  LORazepam (ATIVAN) 0.5 MG tablet, TAKE 1 TABLET BY MOUTH EVERY DAY AS NEEDED FOR ANXIETY, Disp: 30 tablet, Rfl: 2 .  meclizine (ANTIVERT) 12.5 MG tablet, TAKE 1 TABLET EVERY DAY AS NEEDED FOR DIZZINESS, Disp: 30 tablet, Rfl: 2 .  rivaroxaban (XARELTO) 20 MG TABS tablet, Take 1 tablet (20 mg total) by mouth daily with supper., Disp: 90 tablet, Rfl: 3 .  rosuvastatin (CRESTOR) 10 MG tablet, TAKE 1 TABLET BY  MOUTH EVERY DAY, Disp: 90 tablet, Rfl: 3 .  sertraline (ZOLOFT) 100 MG tablet, Take 100 mg by mouth daily., Disp: , Rfl:   Allergies:  Allergies  Allergen Reactions  . Hydrocodone-Acetaminophen Shortness Of Breath  . Oxycodone Hcl Shortness Of Breath  . Telmisartan-Hctz Shortness Of Breath and Palpitations    Dizziness, too  . Ramipril   . Aspirin Other (See Comments) and Rash    Other Reaction: confusion Can't take high dose  . Atorvastatin Other (See Comments)    myalgia  . Buspirone Hcl Other (See Comments)    headache  . Codeine Nausea Only and Other (See Comments)    Other Reaction: confusion  . Codeine Phosphate Itching and Nausea Only  . Morphine Sulfate Itching  . Paroxetine Other (See Comments)    Paresthesias: R arm, R leg, r side of face  . Rabeprazole Other (See Comments)    headache    Past Medical History, Surgical history, Social history, and Family History were reviewed and updated.  Review of Systems: Review of Systems  Constitutional: Negative.   HENT: Negative.   Eyes: Negative.   Respiratory: Negative.   Cardiovascular: Negative.   Gastrointestinal: Negative.   Genitourinary: Negative.   Musculoskeletal: Negative.   Skin: Negative.   Neurological: Negative.   Endo/Heme/Allergies: Negative.   Psychiatric/Behavioral: Negative.  Physical Exam:  weight is 205 lb (93 kg). His oral temperature is 97.3 F (36.3 C) (abnormal). His blood pressure is 141/84 (abnormal) and his pulse is 62. His respiration is 18 and oxygen saturation is 97%.   Physical Exam Vitals signs reviewed.  Constitutional:      Comments: Axillary exam does show bilateral axillary adenopathy.  There is more prominent axillary lymph nodes in the left axilla.  These probably measure about 1.5 cm.  They are mobile and nontender.  HENT:     Head: Normocephalic and atraumatic.  Eyes:     Pupils: Pupils are equal, round, and reactive to light.  Neck:     Musculoskeletal: Normal  range of motion.     Comments: On his neck exam, he does have adenopathy in the left supraclavicular region.  Lymph nodes are mobile.  They probably measure about 1 cm.  I cannot palpate any adenopathy in the right neck. Cardiovascular:     Rate and Rhythm: Normal rate and regular rhythm.     Heart sounds: Normal heart sounds.  Pulmonary:     Effort: Pulmonary effort is normal.     Breath sounds: Normal breath sounds.  Abdominal:     General: Bowel sounds are normal.     Palpations: Abdomen is soft.  Musculoskeletal: Normal range of motion.        General: No tenderness or deformity.  Lymphadenopathy:     Cervical: No cervical adenopathy.  Skin:    General: Skin is warm and dry.     Findings: No erythema or rash.  Neurological:     Mental Status: He is alert and oriented to person, place, and time.  Psychiatric:        Behavior: Behavior normal.        Thought Content: Thought content normal.        Judgment: Judgment normal.     Lab Results  Component Value Date   WBC 13.9 (H) 08/16/2019   HGB 13.5 08/16/2019   HCT 40.5 08/16/2019   MCV 97.8 08/16/2019   PLT 105 (L) 08/16/2019     Chemistry      Component Value Date/Time   NA 138 08/16/2019 1058   NA 145 07/22/2017 0756   NA 139 06/27/2016 1410   K 3.9 08/16/2019 1058   K 4.3 07/22/2017 0756   K 5.1 06/27/2016 1410   CL 105 08/16/2019 1058   CL 105 07/22/2017 0756   CO2 25 08/16/2019 1058   CO2 30 07/22/2017 0756   CO2 28 06/27/2016 1410   BUN 19 08/16/2019 1058   BUN 17 07/22/2017 0756   BUN 20.9 06/27/2016 1410   CREATININE 1.34 (H) 08/16/2019 1058   CREATININE 1.6 (H) 07/22/2017 0756   CREATININE 1.6 (H) 06/27/2016 1410      Component Value Date/Time   CALCIUM 9.4 08/16/2019 1058   CALCIUM 9.6 07/22/2017 0756   CALCIUM 9.6 06/27/2016 1410   ALKPHOS 50 08/16/2019 1058   ALKPHOS 59 07/22/2017 0756   ALKPHOS 62 06/27/2016 1410   AST 15 08/16/2019 1058   AST 18 06/27/2016 1410   ALT 12 08/16/2019 1058    ALT 24 07/22/2017 0756   ALT 24 06/27/2016 1410   BILITOT 0.5 08/16/2019 1058   BILITOT 0.46 06/27/2016 1410         Impression and Plan: [name] is 73 year-old African-American gentleman. He has recurrent pulmonary embolism. I think he needs lifelong anticoagulation.He is doing very well on Xarelto.  He enjoys being on Xarelto.  Again, I highly suspect that we will have to treat him early next year.  I would like to try to get him through the holidays if we can without any therapy.  I probably would consider acalabrutinib.  He does have a cardiac issues but I think acalabrutinib would be easiest for him to take.  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  I think that the acalabrutinib would be okay for this.  I gave him his information about acalabrutinib.  I want to get him back between Thanksgiving and Christmas.  I want to do a CT scan when we see him back so we can see what changes have occurred with his lymphadenopathy.  I spent about 35 minutes with him today.  We went over the lab work.  We went over the side effects about the acalabrutinib.  I answered all of his questions.   Volanda Napoleon, MD 10/5/202012:57 PM

## 2019-08-17 ENCOUNTER — Other Ambulatory Visit: Payer: Medicare Other

## 2019-08-17 ENCOUNTER — Telehealth: Payer: Self-pay | Admitting: Hematology & Oncology

## 2019-08-17 ENCOUNTER — Ambulatory Visit: Payer: Medicare Other | Admitting: Hematology & Oncology

## 2019-08-17 LAB — BETA 2 MICROGLOBULIN, SERUM: Beta-2 Microglobulin: 2.9 mg/L — ABNORMAL HIGH (ref 0.6–2.4)

## 2019-08-17 NOTE — Telephone Encounter (Signed)
lmom to inform patient of appts made for 12/8 at 0830 per 10/5 LOS

## 2019-08-19 ENCOUNTER — Ambulatory Visit (HOSPITAL_COMMUNITY)
Admission: EM | Admit: 2019-08-19 | Discharge: 2019-08-19 | Disposition: A | Discharge status: Home / Self Care | Payer: Medicare Other | Attending: Nurse Practitioner | Admitting: Nurse Practitioner

## 2019-08-19 ENCOUNTER — Ambulatory Visit (INDEPENDENT_AMBULATORY_CARE_PROVIDER_SITE_OTHER): Payer: Medicare Other

## 2019-08-19 ENCOUNTER — Encounter (HOSPITAL_COMMUNITY): Payer: Self-pay | Admitting: Emergency Medicine

## 2019-08-19 ENCOUNTER — Ambulatory Visit: Payer: Self-pay | Admitting: *Deleted

## 2019-08-19 ENCOUNTER — Other Ambulatory Visit: Payer: Self-pay

## 2019-08-19 DIAGNOSIS — R0602 Shortness of breath: Secondary | ICD-10-CM | POA: Diagnosis not present

## 2019-08-19 DIAGNOSIS — S29012A Strain of muscle and tendon of back wall of thorax, initial encounter: Secondary | ICD-10-CM

## 2019-08-19 MED ORDER — TIZANIDINE HCL 4 MG PO TABS: 4.0000 mg | ORAL_TABLET | Freq: Two times a day (BID) | ORAL | 0 refills | Status: AC

## 2019-08-19 MED ORDER — NAPROXEN 500 MG PO TABS: 500.0000 mg | ORAL_TABLET | Freq: Two times a day (BID) | ORAL | 0 refills | Status: AC

## 2019-08-19 NOTE — Telephone Encounter (Signed)
  Pt called in c/o pain in his bilateral back under his rib cage.   It started this morning all of a sudden.   There is no pain when he is sitting down but when he is standing the pain occurs with shooting pains that "takes my breath away".  Denies any urinary symptoms or history of kidney stones.  The protocol is for him to go to the ED however he asked if it was possible he could see Dr. Yong Channel instead.   He is concerned about COVID-19 exposure in the ED.   He has CLL so he wants to be careful.    I called into the office and spoke with [facility] Dr. Ansel Bong nurse and Dr. Yong Channel does not have any openings today plus it would be better for him to go to the ED or an urgent care. I let pt know it was preferable he go to the ED or an urgent care.    Pt has decided to go to [facility].  I sent these notes to Dr. Ansel Bong office.   Reason for Disposition . [1] Sudden onset of severe flank pain AND [2] age > 51  Answer Assessment - Initial Assessment Questions 1. LOCATION: "Where does it hurt?" (e.g., left, right)     It's my upper back under my ribs on both sides.   The sharp pain takes my breath away.    No fever. 2. ONSET: "When did the pain start?"     This morning.   It happened after I got up.   I walked with a friend in the park and had to sit down.   I have CLL so my white count is elevated. My oncologist has me drinking a lot of water.   Denies burning or urinary frequency.   3. SEVERITY: "How bad is the pain?" (e.g., Scale 1-10; mild, moderate, or severe)   - MILD (1-3): doesn't interfere with normal activities    - MODERATE (4-7): interferes with normal activities or awakens from sleep    - SEVERE (8-10): excruciating pain and patient unable to do normal activities (stays in bed)       Moderate when sharp pains occurs it's on both sides. 4. PATTERN: "Does the pain come and go, or is it constant?"      I can sit down and it does not hurt at all.   But minute I stand up it's  there. 5. CAUSE: "What do you think is causing the pain?"     Doctor told me my kidneys were fine when I had blood work done recently. 6. OTHER SYMPTOMS:  "Do you have any other symptoms?" (e.g., fever, abdominal pain, vomiting, leg weakness, burning with urination, blood in urine)     All pain is under the rib area.  Denies any other symptoms. 7. PREGNANCY:  "Is there any chance you are pregnant?" "When was your last menstrual period?"     N/A  Protocols used: FLANK PAIN-A-AH

## 2019-08-19 NOTE — ED Notes (Signed)
Patient transported to X-ray.  Patient is not in treatment room.

## 2019-08-19 NOTE — ED Provider Notes (Signed)
[facility]    CSN: [id number] Arrival date & time: 08/19/19  1210      History   Chief Complaint Chief Complaint  Patient presents with  . Back Pain    HPI Jaylun Blancarte Dombkowski is a 73 y.o. male.   Subjective:  Arye Espejo Hummel is a 73 y.o. male who presents for evaluation of thoracic back pain. The patient has had no prior back problems. Symptoms have been present for several hours and are unchanged. Onset was related to / precipitated by no known injury. Acute onset while making his coffee this morning. The pain is located across the thoracic region of his back and radiates to the around to the chest. The pain is described as stabbing and occurs intermittently. He is currently in no pain. Symptoms are exacerbated by flexion and standing. Symptoms are improved by rest. He has also tried acetaminophen which provided no symptom relief. He endorses mild shortness of breath. Denies any weakness, facial/limb paraesthesias, dysuria, flank pain, urinary incontinence, urinary retention, bowel incontinence, constipation, groin/perineal numbness, nausea, vomiting, palpitaitons or chest pain. The patient has no "red flag" history indicative of complicated back pain.  The following portions of the patient's history were reviewed and updated as appropriate: allergies, current medications, past family history, past medical history, past social history, past surgical history and problem list.        Past Medical History:  Diagnosis Date  . Anticoagulants causing adverse effect in therapeutic use   . Anxiety    Paxil seemed to cause odd neuro side effects; better on prozac as of 09/2015  . Aortic regurgitation 04/2007 surgery   Resolved [facility]R at [facility]  . Cervical spondylosis    ESI by [name] in Ortho. 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 Chronic lymphocytic leukemia (CLL), B-cell ([place]) approx 2012   stable on f/u's with [name] (most recent 07/2017)  . History of prostate  cancer 2003   Rad prost  . History of pulmonary embolism 2011; 10/2014   Recurrence off of anticoagulation 10/2014: per hem/onc ([name]) pt needs lifelong anticoagulation (hypercoag w/u neg).  . Hyperlipidemia    statin-intolerant, except for crestor low dose.  Marland Kitchen Hypertension   . Migraine syndrome   . Mitral regurgitation   . PAF (paroxysmal atrial fibrillation) ([place])   . Panic attacks    "           "             "                  "                  "                       "                             "                         . Pulmonary nodule, right 2015   RLL: resected at [facility] --VATS; Benign RLL nodule (fungal and AFB stains neg).  Plan is for [facility] thoracic surg to do f/u o/v & CT chest 1 yr   . Right-sided headache 07/2016   Primary stabbing HA or migraine per neuro (Dr. Tomi Likens).  Gabapentin and  topamax not tolerated.  These resolved spontaneously.    Patient Active Problem List   Diagnosis Date Noted  . PTSD (post-traumatic stress disorder) 09/29/2018  . BPPV (benign paroxysmal positional vertigo) 09/04/2017  . CKD (chronic kidney disease), stage III 08/11/2017  . Former smoker 08/29/2016  . Cervical spondylosis   . S/P Bioprosthetic AVR (aortic valve replacement) in 2011 03/22/2015  . Recurrent pulmonary embolism ([place]) 11/09/2014  . Lung nodule 11/02/2014  . Chronic lymphocytic leukemia ([place]) 11/11/2013  . Migraine 10/08/2010  . GERD 12/11/2009  . MITRAL REGURGITATION 04/07/2009  . Insomnia 02/03/2008  . Anxiety state 09/06/2007  . HLD (hyperlipidemia) 04/20/2007  . Essential hypertension 04/20/2007  . Atrial fibrillation ([place]) 04/20/2007  . PROSTATE CANCER, HX OF 04/20/2007    Past Surgical History:  Procedure Laterality Date  . AORTIC VALVE REPLACEMENT  2011   [facility]  (bioprosthetic)  . CARDIAC CATHETERIZATION  04/2010   Normal coronaries.  . Carotid dopplers  08/2015   NORMAL  . CATARACT EXTRACTION     L 12/06/09   R 09/14/09  . COLONOSCOPY  01/26/13    tic's, o/w normal.  ([place])--recall 10 yrs.  Marland Kitchen PENILE PROSTHESIS IMPLANT    . PROSTATECTOMY  04/2002   for prostate cancer  . Pulmonary nodule resection  Fall/winter 2016   [facility]  . SHOULDER SURGERY     rotator cuff on both arms   . TIBIALIS TENDON TRANSFER / REPAIR Right 03/02/2019   Procedure: RECONSTRUCTION OF RIGHT  ANTERIOR TIBIALIS TENDON;  Surgeon: Erle Crocker, MD;  Location: [facility];  Service: Orthopedics;  Laterality: Right;  . TRANSTHORACIC ECHOCARDIOGRAM  09/20/2014; 03/2016; 03/2017   2015-mild LVH, EF 50-55%, wall motion nl, grade I diast dysfxn, prosth aort valve good,transaortic gradients decreased compared to echo 11/2013.   03/2016: EF 55-60%, normal LV function, severe LVH, normal diast fxn, mild increase in AV gradient compared to 09/2014 echo.  2018: EF 55-60%, normal LV fxn, grd II DD, AV stable/gradient's stable.       Home Medications    Prior to Admission medications   Medication Sig Start Date End Date Taking? Authorizing Provider  acetaminophen (TYLENOL) 500 MG tablet Take 2 tablets (1,000 mg total) by mouth every 6 (six) hours as needed. 03/02/19 03/01/20  Erle Crocker, MD  cholecalciferol (VITAMIN D) 1000 units tablet Take 1,000 Units by mouth daily.    [provider]  hydrochlorothiazide (HYDRODIURIL) 12.5 MG tablet TAKE 1 TABLET BY MOUTH EVERY DAY 04/16/19   [name], MD  LORazepam (ATIVAN) 0.5 MG tablet TAKE 1 TABLET BY MOUTH EVERY DAY AS NEEDED FOR ANXIETY 06/17/19   [name], MD  meclizine (ANTIVERT) 12.5 MG tablet TAKE 1 TABLET EVERY DAY AS NEEDED FOR DIZZINESS 08/05/19   [name], MD  naproxen (NAPROSYN) 500 MG tablet Take 1 tablet (500 mg total) by mouth 2 (two) times daily for 7 days. 08/19/19 08/26/19  Enrique Sack, FNP  rivaroxaban (XARELTO) 20 MG TABS tablet Take 1 tablet (20 mg total) by mouth daily with supper. 02/06/18   [name], MD  rosuvastatin (CRESTOR) 10 MG tablet TAKE 1  TABLET BY MOUTH EVERY DAY 01/06/19   [name], MD  sertraline (ZOLOFT) 100 MG tablet Take 100 mg by mouth daily.    [provider]  tiZANidine (ZANAFLEX) 4 MG tablet Take 1 tablet (4 mg total) by mouth 2 (two) times daily for 7 days. 08/19/19 08/26/19  Enrique Sack, FNP  Family History Family History  Problem Relation Age of Onset  . Cancer Sister        uterine  . Colon cancer Sister 38  . Stroke Mother   . Prostate cancer Father   . Stroke Sister   . Stroke Brother   . Heart attack Neg Hx     Social History Social History   Tobacco Use  . Smoking status: Former Smoker    Packs/day: 1.00    Years: 12.00    Pack years: 12.00    Types: Cigarettes    Start date: 11/28/1968    Quit date: 01/29/1981    Years since quitting: 38.5  . Smokeless tobacco: Never Used  . Tobacco comment: quit 73 yo   Substance Use Topics  . Alcohol use: Yes    Alcohol/week: 0.0 standard drinks    Comment: occasional  . Drug use: No     Allergies   Hydrocodone-acetaminophen, Oxycodone hcl, Telmisartan-hctz, Ramipril, Aspirin, Atorvastatin, Buspirone hcl, Codeine, Codeine phosphate, Morphine sulfate, Paroxetine, and Rabeprazole   Review of Systems Review of Systems  Constitutional: Negative for fever.  Respiratory: Positive for shortness of breath.   Gastrointestinal: Negative.   Genitourinary: Negative.   Musculoskeletal: Positive for back pain.  Neurological: Negative.   All other systems reviewed and are negative.    Physical Exam Triage Vital Signs ED Triage Vitals [08/19/19 1251]  Enc Vitals Group     BP 112/73     Pulse Rate 79     Resp 18     Temp 98.1 F (36.7 C)     Temp Source Oral     SpO2 100 %     Weight      Height      Head Circumference      Peak Flow      Pain Score 7     Pain Loc      Pain Edu?      Excl. in [place]?    No data found.  Updated Vital Signs BP 112/73 (BP Location: Right Arm)   Pulse 79   Temp 98.1 F (36.7 C) (Oral)    Resp 18   SpO2 100%   Visual Acuity Right Eye Distance:   Left Eye Distance:   Bilateral Distance:    Right Eye Near:   Left Eye Near:    Bilateral Near:     Physical Exam Vitals signs reviewed.  Constitutional:      Appearance: Normal appearance.  HENT:     Head: Normocephalic.  Neck:     Musculoskeletal: Normal range of motion and neck supple.  Cardiovascular:     Rate and Rhythm: Normal rate and regular rhythm.     Pulses: Normal pulses.     Heart sounds: Normal heart sounds.  Pulmonary:     Effort: Pulmonary effort is normal.     Breath sounds: Normal breath sounds.  Musculoskeletal: Normal range of motion.     Thoracic back: He exhibits pain. He exhibits normal range of motion, no tenderness, no bony tenderness, no swelling, no edema, no deformity, no laceration and no spasm.  Skin:    General: Skin is warm and dry.  Neurological:     General: No focal deficit present.     Mental Status: He is alert and oriented to person, place, and time.     Cranial Nerves: Cranial nerves are intact.     Sensory: Sensation is intact. No sensory deficit.     Motor: Motor function  is intact.     Coordination: Coordination is intact.     Gait: Gait is intact.  Psychiatric:        Mood and Affect: Mood normal.        Behavior: Behavior normal.      UC Treatments / Results  Labs (all labs ordered are listed, but only abnormal results are displayed) Labs Reviewed - No data to display  EKG   Radiology Dg Thoracic Spine 2 View  Result Date: 08/19/2019 CLINICAL DATA:  Thoracic back pain. EXAM: THORACIC SPINE 2 VIEWS COMPARISON:  CT chest 04/13/2019 FINDINGS: Negative for fracture or mass. Disc spaces maintained. Mild anterior spurring T9-10. Aortic valve replacement.  Cardiac enlargement. IMPRESSION: No acute thoracic abnormality. Electronically Signed   By: Franchot Gallo M.D.   On: 08/19/2019 14:52    Procedures Procedures (including critical care time)  Medications  Ordered in UC Medications - No data to display  Initial Impression / Assessment and Plan / UC Course  I have reviewed the triage vital signs and the nursing notes.  Pertinent labs & imaging results that were available during my care of the patient were reviewed by me and considered in my medical decision making (see chart for details).    73 y.o. male who presents for evaluation of thoracic back pain with radiation around to the chest. No weakness, facial/limb paraesthesias, dysuria, flank pain, urinary incontinence, urinary retention, bowel incontinence, constipation, groin/perineal numbness, nausea, vomiting, palpitaitons or chest pain. Patient AAOx3. Afebrile. Nontoxic appearing. Physical exam unremarkable. No focal deficits. EKG unchanged from prior studies and without any acute findings. Thoracic spine x-ray negative for any abnormalities. Will treat as an acute thoracic strain.    Plan:  Natural history and expected course discussed. Questions answered. Neurosurgeon distributed. Proper lifting, bending technique discussed. Stretching exercises discussed. Regular aerobic and trunk strengthening exercises discussed. Short (2-4 day) period of relative rest recommended until acute symptoms improve. Ice to affected area as needed for local pain relief. Heat to affected area as needed for local pain relief. OTC analgesics as needed. Muscle relaxants per medication orders.  Today's evaluation has revealed no signs of a dangerous process. Discussed diagnosis with patient and/or guardian. Patient and/or guardian aware of their diagnosis, possible red flag symptoms to watch out for and need for close follow up. Patient and/or guardian understands verbal and written discharge instructions. Patient and/or guardian comfortable with plan and disposition.  Patient and/or guardian has a clear mental status at this time, good insight into illness (after discussion and teaching) and has clear  judgment to make decisions regarding their care  This care was provided during an unprecedented National Emergency due to the Novel Coronavirus (COVID-19) pandemic. COVID-19 infections and transmission risks place heavy strains on healthcare resources.  As this pandemic evolves, our facility, providers, and staff strive to respond fluidly, to remain operational, and to provide care relative to available resources and information. Outcomes are unpredictable and treatments are without well-defined guidelines. Further, the impact of COVID-19 on all aspects of urgent care, including the impact to patients seeking care for reasons other than COVID-19, is unavoidable during this national emergency. At this time of the global pandemic, management of patients has significantly changed, even for non-COVID positive patients given high local and regional COVID volumes at this time requiring high healthcare system and resource utilization. The standard of care for management of both COVID suspected and non-COVID suspected patients continues to change rapidly at the local, regional, national, and global  levels. This patient was worked up and treated to the best available but ever changing evidence and resources available at this current time.   Documentation was completed with the aid of voice recognition software. Transcription may contain typographical errors.   Final Clinical Impressions(s) / UC Diagnoses   Final diagnoses:  Strain of thoracic back region     Discharge Instructions     Take medications as prescribed. Alternate between ice and heat to affected areas. Complete the exercises as attached to help with your pain. Follow-up with your primary care provider if no improvement in your symptoms. Go to the ED immediately if your symptoms get worse or you have any other concerns.     ED Prescriptions    Medication Sig Dispense Auth. Provider   tiZANidine (ZANAFLEX) 4 MG tablet Take 1 tablet (4 mg  total) by mouth 2 (two) times daily for 7 days. 14 tablet Enrique Sack, FNP   naproxen (NAPROSYN) 500 MG tablet Take 1 tablet (500 mg total) by mouth 2 (two) times daily for 7 days. 14 tablet Enrique Sack, FNP     PDMP not reviewed this encounter.   Enrique Sack, [place] 08/19/19 [phone]

## 2019-08-19 NOTE — Telephone Encounter (Signed)
See note

## 2019-08-19 NOTE — Telephone Encounter (Signed)
FYI

## 2019-08-19 NOTE — ED Triage Notes (Signed)
Pt here for mid back pain upon waking today; pt sts worse with inspiration

## 2019-08-19 NOTE — Discharge Instructions (Addendum)
Take medications as prescribed. Alternate between ice and heat to affected areas. Complete the exercises as attached to help with your pain. Follow-up with your primary care provider if no improvement in your symptoms. Go to the ED immediately if your symptoms get worse or you have any other concerns.

## 2019-08-27 ENCOUNTER — Ambulatory Visit (HOSPITAL_COMMUNITY): Payer: Medicare Other | Attending: Cardiology

## 2019-08-27 ENCOUNTER — Other Ambulatory Visit: Payer: Self-pay

## 2019-08-27 ENCOUNTER — Encounter (HOSPITAL_COMMUNITY): Payer: Medicare Other

## 2019-08-27 DIAGNOSIS — Z952 Presence of prosthetic heart valve: Secondary | ICD-10-CM | POA: Insufficient documentation

## 2019-08-30 ENCOUNTER — Telehealth: Payer: Self-pay

## 2019-08-30 ENCOUNTER — Other Ambulatory Visit (HOSPITAL_COMMUNITY): Payer: Medicare Other

## 2019-08-30 DIAGNOSIS — Z952 Presence of prosthetic heart valve: Secondary | ICD-10-CM

## 2019-08-30 NOTE — Telephone Encounter (Signed)
Called patient with results. Will place order for TAVR gated CTA of valve. Rescheduled patient's appointment to 09/30/19 with Dr. [name] to discuss results.

## 2019-08-30 NOTE — Telephone Encounter (Signed)
-----   Message from Josue Hector, MD sent at 08/27/2019  5:20 PM EDT ----- Gradient across AVR much higher than previous needs to see me and discuss. Order TAVR gated CTA of valve for me to read before appointment with me to discuss

## 2019-09-06 ENCOUNTER — Ambulatory Visit: Payer: Medicare Other | Admitting: Cardiovascular Disease

## 2019-09-08 ENCOUNTER — Telehealth: Payer: Self-pay | Admitting: Cardiovascular Disease

## 2019-09-08 MED ORDER — METOPROLOL TARTRATE 100 MG PO TABS: ORAL_TABLET | ORAL | 0 refills | Status: DC

## 2019-09-08 NOTE — Telephone Encounter (Signed)
Called patient about instructions for CT. Went over CT instructions and Metoprolol protocol for test. Patient's CT is on 09/15/19. Patient just has a carotid duplex tomorrow, and informed patient that he does not have to do anything prior to that test. Patient verbalized understanding.

## 2019-09-08 NOTE — Telephone Encounter (Signed)
New Message:  Patient has a carotid scan tomorrow at 3:00 pm, and two different CT scans  09/15/19. He wanted to know if there are any specific instructions (no eating or drinking,hold medications, etc) before any of these tests.  The patient may be contacted via MyChart message if that is easier for the office

## 2019-09-09 ENCOUNTER — Ambulatory Visit (HOSPITAL_COMMUNITY)
Admission: RE | Admit: 2019-09-09 | Discharge: 2019-09-09 | Disposition: A | Discharge status: Home / Self Care | Payer: Medicare Other | Source: Ambulatory Visit | Attending: Cardiology | Admitting: Cardiology

## 2019-09-09 ENCOUNTER — Encounter (HOSPITAL_COMMUNITY): Payer: Self-pay

## 2019-09-09 ENCOUNTER — Ambulatory Visit: Payer: Medicare Other | Admitting: Psychology

## 2019-09-09 ENCOUNTER — Ambulatory Visit (HOSPITAL_BASED_OUTPATIENT_CLINIC_OR_DEPARTMENT_OTHER)
Admission: RE | Admit: 2019-09-09 | Discharge: 2019-09-09 | Disposition: A | Discharge status: Home / Self Care | Payer: Medicare Other | Source: Ambulatory Visit | Attending: Cardiovascular Disease | Admitting: Cardiovascular Disease

## 2019-09-09 ENCOUNTER — Other Ambulatory Visit: Payer: Self-pay

## 2019-09-09 DIAGNOSIS — R0989 Other specified symptoms and signs involving the circulatory and respiratory systems: Secondary | ICD-10-CM

## 2019-09-09 NOTE — Progress Notes (Signed)
Carotid duplex complete. Please see CV Proc tab for preliminary results. Lita Mains- RDMS, RVT 4:24 PM  09/09/2019

## 2019-09-13 ENCOUNTER — Telehealth (HOSPITAL_COMMUNITY): Payer: Self-pay | Admitting: Emergency Medicine

## 2019-09-13 ENCOUNTER — Other Ambulatory Visit: Payer: Self-pay | Admitting: Family Medicine

## 2019-09-13 NOTE — Telephone Encounter (Signed)
Pt returning phone call regarding upcoming cardiac imaging study; pt verbalizes understanding of appt date/time, parking situation and where to check in, pre-test NPO status and medications ordered, and verified current allergies; name and call back number provided for further questions should they arise Marchia Bond RN Navigator Cardiac Imaging [facility] [phone] office [phone] cell  Suggested patient take 50mg  metoprolol instead of 100mg  as last HR in OV was 62bpm

## 2019-09-13 NOTE — Telephone Encounter (Signed)
Left message on voicemail with name and callback number Derrich Gaby RN Navigator Cardiac Imaging [facility] [phone] Office [phone] Cell  

## 2019-09-15 ENCOUNTER — Encounter: Payer: Self-pay | Admitting: *Deleted

## 2019-09-15 ENCOUNTER — Encounter: Payer: Self-pay | Admitting: Hematology & Oncology

## 2019-09-15 ENCOUNTER — Ambulatory Visit (HOSPITAL_COMMUNITY)
Admission: RE | Admit: 2019-09-15 | Discharge: 2019-09-15 | Disposition: A | Discharge status: Home / Self Care | Payer: Medicare Other | Source: Ambulatory Visit | Attending: Cardiovascular Disease | Admitting: Cardiovascular Disease

## 2019-09-15 ENCOUNTER — Other Ambulatory Visit: Payer: Self-pay

## 2019-09-15 ENCOUNTER — Telehealth: Payer: Self-pay

## 2019-09-15 DIAGNOSIS — I480 Paroxysmal atrial fibrillation: Secondary | ICD-10-CM | POA: Insufficient documentation

## 2019-09-15 DIAGNOSIS — I2510 Atherosclerotic heart disease of native coronary artery without angina pectoris: Secondary | ICD-10-CM | POA: Diagnosis not present

## 2019-09-15 DIAGNOSIS — Z7901 Long term (current) use of anticoagulants: Secondary | ICD-10-CM | POA: Insufficient documentation

## 2019-09-15 DIAGNOSIS — I517 Cardiomegaly: Secondary | ICD-10-CM | POA: Insufficient documentation

## 2019-09-15 DIAGNOSIS — Z952 Presence of prosthetic heart valve: Secondary | ICD-10-CM

## 2019-09-15 DIAGNOSIS — I350 Nonrheumatic aortic (valve) stenosis: Secondary | ICD-10-CM

## 2019-09-15 DIAGNOSIS — R590 Localized enlarged lymph nodes: Secondary | ICD-10-CM | POA: Insufficient documentation

## 2019-09-15 DIAGNOSIS — I700 Atherosclerosis of aorta: Secondary | ICD-10-CM | POA: Insufficient documentation

## 2019-09-15 DIAGNOSIS — I359 Nonrheumatic aortic valve disorder, unspecified: Secondary | ICD-10-CM

## 2019-09-15 MED ORDER — IOHEXOL 350 MG/ML SOLN
100.0000 mL | Freq: Once | INTRAVENOUS | Status: AC | PRN
  Administered 2019-09-15: 75 mL via INTRAVENOUS

## 2019-09-15 MED ORDER — WARFARIN SODIUM 5 MG PO TABS: 5.0000 mg | ORAL_TABLET | Freq: Every day | ORAL | 0 refills | Status: DC

## 2019-09-15 MED ORDER — NITROGLYCERIN 0.4 MG SL SUBL: 0.8000 mg | SUBLINGUAL_TABLET | SUBLINGUAL | Status: DC | PRN

## 2019-09-15 NOTE — Telephone Encounter (Signed)
-----   Message from Josue Hector, MD sent at 09/15/2019  4:14 PM EST ----- He has thrombus on his AVR needs to stop xarelto and start coumadin Set him up in coumadin clinic and call in script He did admit to not taking xarelto for 2-3 weeks when I spoke with him but similar to [facility] literature only talks about coumadin for this issue f/u with me with echo 3 months after being on Rx coumadin

## 2019-09-15 NOTE — Telephone Encounter (Signed)
Called patient with appointment for coumadin clinic. Sent coumadin 5 mg by mouth daily to patient's pharmacy of choice. Ordered echo for 3 months and will have patient come in same day to see Dr. [name].

## 2019-09-16 ENCOUNTER — Telehealth: Payer: Self-pay | Admitting: Family Medicine

## 2019-09-16 NOTE — Telephone Encounter (Signed)
I called the patient to schedule AWV with [name].  There was no answer, and the voicemail was full.

## 2019-09-20 ENCOUNTER — Encounter (HOSPITAL_COMMUNITY): Payer: Medicare Other

## 2019-09-20 ENCOUNTER — Encounter: Payer: Self-pay | Admitting: Family Medicine

## 2019-09-21 ENCOUNTER — Ambulatory Visit (INDEPENDENT_AMBULATORY_CARE_PROVIDER_SITE_OTHER): Payer: Medicare Other | Admitting: *Deleted

## 2019-09-21 ENCOUNTER — Other Ambulatory Visit: Payer: Self-pay

## 2019-09-21 DIAGNOSIS — Z5181 Encounter for therapeutic drug level monitoring: Secondary | ICD-10-CM | POA: Diagnosis not present

## 2019-09-21 DIAGNOSIS — I480 Paroxysmal atrial fibrillation: Secondary | ICD-10-CM | POA: Diagnosis not present

## 2019-09-21 DIAGNOSIS — I2699 Other pulmonary embolism without acute cor pulmonale: Secondary | ICD-10-CM | POA: Diagnosis not present

## 2019-09-21 DIAGNOSIS — Z952 Presence of prosthetic heart valve: Secondary | ICD-10-CM

## 2019-09-21 LAB — POCT INR: INR: 1 — AB (ref 2.0–3.0)

## 2019-09-21 NOTE — Patient Instructions (Addendum)
Description   Today and tomorrow 1.5 tablets then start taking 1.5 tablets daily. Recheck INR in 1 week. Coumadin Clinic [phone] Main [phone]      A full discussion of the nature of anticoagulants has been carried out.  A benefit risk analysis has been presented to the patient, so that they understand the justification for choosing anticoagulation at this time. The need for frequent and regular monitoring, precise dosage adjustment and compliance is stressed.  Side effects of potential bleeding are discussed.  The patient should avoid any OTC items containing aspirin or ibuprofen, and should avoid great swings in general diet.  Avoid alcohol consumption.  Call if any signs of abnormal bleeding.  Marland Kitchen

## 2019-09-23 ENCOUNTER — Ambulatory Visit (INDEPENDENT_AMBULATORY_CARE_PROVIDER_SITE_OTHER): Payer: Medicare Other | Admitting: Psychology

## 2019-09-23 DIAGNOSIS — F4321 Adjustment disorder with depressed mood: Secondary | ICD-10-CM | POA: Diagnosis not present

## 2019-09-28 ENCOUNTER — Other Ambulatory Visit: Payer: Self-pay

## 2019-09-28 ENCOUNTER — Ambulatory Visit (INDEPENDENT_AMBULATORY_CARE_PROVIDER_SITE_OTHER): Payer: Medicare Other | Admitting: *Deleted

## 2019-09-28 DIAGNOSIS — I480 Paroxysmal atrial fibrillation: Secondary | ICD-10-CM

## 2019-09-28 DIAGNOSIS — Z952 Presence of prosthetic heart valve: Secondary | ICD-10-CM

## 2019-09-28 DIAGNOSIS — I2699 Other pulmonary embolism without acute cor pulmonale: Secondary | ICD-10-CM | POA: Diagnosis not present

## 2019-09-28 DIAGNOSIS — Z5181 Encounter for therapeutic drug level monitoring: Secondary | ICD-10-CM

## 2019-09-28 LAB — POCT INR: INR: 1.6 — AB (ref 2.0–3.0)

## 2019-09-28 NOTE — Patient Instructions (Signed)
Description   Today take 2 tablets then start taking 1.5 tablets daily except 2 tablets on Tuesdays and Saturdays. Recheck INR in 1 week. Coumadin Clinic [phone] Main [phone]

## 2019-09-29 ENCOUNTER — Encounter: Payer: Self-pay | Admitting: Hematology & Oncology

## 2019-09-30 ENCOUNTER — Ambulatory Visit: Payer: Medicare Other | Admitting: Cardiovascular Disease

## 2019-10-05 ENCOUNTER — Encounter: Payer: Self-pay | Admitting: Hematology & Oncology

## 2019-10-05 ENCOUNTER — Other Ambulatory Visit: Payer: Self-pay

## 2019-10-05 ENCOUNTER — Ambulatory Visit (INDEPENDENT_AMBULATORY_CARE_PROVIDER_SITE_OTHER): Payer: Medicare Other | Admitting: *Deleted

## 2019-10-05 ENCOUNTER — Inpatient Hospital Stay (HOSPITAL_BASED_OUTPATIENT_CLINIC_OR_DEPARTMENT_OTHER): Payer: Medicare Other | Admitting: Hematology & Oncology

## 2019-10-05 ENCOUNTER — Inpatient Hospital Stay: Payer: Medicare Other | Attending: Hematology & Oncology

## 2019-10-05 VITALS — BP 113/78 | HR 77 | Temp 97.8°F | Resp 18 | Wt 203.0 lb

## 2019-10-05 DIAGNOSIS — I2699 Other pulmonary embolism without acute cor pulmonale: Secondary | ICD-10-CM | POA: Insufficient documentation

## 2019-10-05 DIAGNOSIS — Z79899 Other long term (current) drug therapy: Secondary | ICD-10-CM | POA: Diagnosis not present

## 2019-10-05 DIAGNOSIS — Z5181 Encounter for therapeutic drug level monitoring: Secondary | ICD-10-CM | POA: Diagnosis not present

## 2019-10-05 DIAGNOSIS — Z952 Presence of prosthetic heart valve: Secondary | ICD-10-CM

## 2019-10-05 DIAGNOSIS — Z7901 Long term (current) use of anticoagulants: Secondary | ICD-10-CM | POA: Diagnosis not present

## 2019-10-05 DIAGNOSIS — C9110 Chronic lymphocytic leukemia of B-cell type not having achieved remission: Secondary | ICD-10-CM | POA: Insufficient documentation

## 2019-10-05 DIAGNOSIS — I480 Paroxysmal atrial fibrillation: Secondary | ICD-10-CM | POA: Diagnosis not present

## 2019-10-05 DIAGNOSIS — R591 Generalized enlarged lymph nodes: Secondary | ICD-10-CM | POA: Insufficient documentation

## 2019-10-05 LAB — CBC WITH DIFFERENTIAL (CANCER CENTER ONLY)
Abs Immature Granulocytes: 0.01 10*3/uL (ref 0.00–0.07)
Basophils Absolute: 0 10*3/uL (ref 0.0–0.1)
Basophils Relative: 0 %
Eosinophils Absolute: 0.1 10*3/uL (ref 0.0–0.5)
Eosinophils Relative: 1 %
HCT: 40 % (ref 39.0–52.0)
Hemoglobin: 13.4 g/dL (ref 13.0–17.0)
Immature Granulocytes: 0 %
Lymphocytes Relative: 77 %
Lymphs Abs: 10.9 10*3/uL — ABNORMAL HIGH (ref 0.7–4.0)
MCH: 32.6 pg (ref 26.0–34.0)
MCHC: 33.5 g/dL (ref 30.0–36.0)
MCV: 97.3 fL (ref 80.0–100.0)
Monocytes Absolute: 1.9 10*3/uL — ABNORMAL HIGH (ref 0.1–1.0)
Monocytes Relative: 13 %
Neutro Abs: 1.2 10*3/uL — ABNORMAL LOW (ref 1.7–7.7)
Neutrophils Relative %: 9 %
Platelet Count: 122 10*3/uL — ABNORMAL LOW (ref 150–400)
RBC: 4.11 MIL/uL — ABNORMAL LOW (ref 4.22–5.81)
RDW: 12.7 % (ref 11.5–15.5)
WBC Count: 14.1 10*3/uL — ABNORMAL HIGH (ref 4.0–10.5)
nRBC: 0 % (ref 0.0–0.2)

## 2019-10-05 LAB — CMP (CANCER CENTER ONLY)
ALT: 12 U/L (ref 0–44)
AST: 17 U/L (ref 15–41)
Albumin: 4.7 g/dL (ref 3.5–5.0)
Alkaline Phosphatase: 48 U/L (ref 38–126)
Anion gap: 8 (ref 5–15)
BUN: 22 mg/dL (ref 8–23)
CO2: 30 mmol/L (ref 22–32)
Calcium: 9.3 mg/dL (ref 8.9–10.3)
Chloride: 100 mmol/L (ref 98–111)
Creatinine: 1.44 mg/dL — ABNORMAL HIGH (ref 0.61–1.24)
GFR, Est AFR Am: 55 mL/min — ABNORMAL LOW (ref >60)
GFR, Estimated: 48 mL/min — ABNORMAL LOW (ref >60)
Glucose, Bld: 127 mg/dL — ABNORMAL HIGH (ref 70–99)
Potassium: 3.7 mmol/L (ref 3.5–5.1)
Sodium: 138 mmol/L (ref 135–145)
Total Bilirubin: 0.4 mg/dL (ref 0.3–1.2)
Total Protein: 6.8 g/dL (ref 6.5–8.1)

## 2019-10-05 LAB — POCT INR: INR: 2.2 (ref 2.0–3.0)

## 2019-10-05 MED ORDER — WARFARIN SODIUM 5 MG PO TABS: 5.0000 mg | ORAL_TABLET | Freq: Every day | ORAL | 0 refills | Status: DC

## 2019-10-05 NOTE — Patient Instructions (Signed)
Description   Continue taking 1.5 tablets daily except 2 tablets on Tuesdays and Saturdays. Recheck INR in 1 week. Coumadin Clinic [phone] Main [phone]

## 2019-10-05 NOTE — Progress Notes (Signed)
Hematology and Oncology Follow Up Visit  Shakil Bodner Ulbrich [id number] [birth year] 73 y.o. 10/05/2019   Principle Diagnosis:   Right lower lobe pulmonary nodule- non-malignant  Recurrent pulmonary embolism  CLL -- progressive -- 11q-/13q-  Current Therapy:    Xarelto 20 mg by mouth daily       Rituxan/bendamustine-s/p cycle #2 - given 06/18/2017      Interim History:  Mr.  Venturo is back for follow-up.  He actually looks quite good.  I was quite worried that things were starting to happen to him with respect to his heart.  I know he has been followed very closely by Dr. [name] of cardiology.  It looks like there is an echocardiogram that was done in October.  There is seem to show that there was increased gradient across the aortic valve.  It also looks like the valvular area of the aortic valve was less.  A CT angiogram was then done.  This was done of the chest abdomen pelvis.  The CT angiogram showed that there was some mild enlargement of some lymph nodes in the mediastinum and hilum and axilla.  In addition, there is some slightly enlarged lymph nodes in the abdomen.  It looks like there was some indication that he might undergo TAVR.  When I saw Mr. Sharrett today, he really looks good.  He feels great.  He really does not complain of shortness of breath.  There is no chest wall pain.  He has had no fever.  He has not noted any palpable lymph nodes.  His labs show that his CLL is holding pretty steady.  His white cell count, hemoglobin and platelet count all are about the same as they were 6 weeks ago.  Thankfully, I do not think that we have to embark upon any intervention for the CLL right now.  I think that treatment probably would have more problems then he has right now with the CLL.  He is quite worried over the possibility of cardiac issues.  I tried to reassure him that I did not think that he was in need of any type of urgent procedure.  He is eating well.  He is not lost  weight.  He has had no rashes.  There is been no leg swelling.  He has had no change in bowel or bladder habits.  He does his yoga.  He does acupuncture.  He is very into naturopathic type treatments.  Overall, I would have to say that his performance status is ECOG 0.    Medications:  Current Outpatient Medications:  .  acetaminophen (TYLENOL) 500 MG tablet, Take 2 tablets (1,000 mg total) by mouth every 6 (six) hours as needed., Disp: 100 tablet, Rfl: 2 .  cholecalciferol (VITAMIN D) 1000 units tablet, Take 1,000 Units by mouth daily., Disp: , Rfl:  .  hydrochlorothiazide (HYDRODIURIL) 12.5 MG tablet, TAKE 1 TABLET BY MOUTH EVERY DAY, Disp: 90 tablet, Rfl: 1 .  LORazepam (ATIVAN) 0.5 MG tablet, TAKE 1 TABLET BY MOUTH EVERY DAY AS NEEDED FOR ANXIETY, Disp: 30 tablet, Rfl: 2 .  meclizine (ANTIVERT) 12.5 MG tablet, TAKE 1 TABLET EVERY DAY AS NEEDED FOR DIZZINESS, Disp: 30 tablet, Rfl: 2 .  metoprolol tartrate (LOPRESSOR) 100 MG tablet, Take 1 tablet 2 hours prior to CT test, Disp: 1 tablet, Rfl: 0 .  rosuvastatin (CRESTOR) 10 MG tablet, TAKE 1 TABLET BY MOUTH EVERY DAY, Disp: 90 tablet, Rfl: 3 .  sertraline (ZOLOFT) 100 MG tablet, Take  100 mg by mouth daily., Disp: , Rfl:  .  warfarin (COUMADIN) 5 MG tablet, Take 1 tablet (5 mg total) by mouth daily at 6 PM., Disp: 40 tablet, Rfl: 0  Allergies:  Allergies  Allergen Reactions  . Hydrocodone-Acetaminophen Shortness Of Breath  . Oxycodone Hcl Shortness Of Breath  . Telmisartan-Hctz Shortness Of Breath and Palpitations    Dizziness, too  . Ramipril   . Aspirin Other (See Comments) and Rash    Other Reaction: confusion Can't take high dose  . Atorvastatin Other (See Comments)    myalgia  . Buspirone Hcl Other (See Comments)    headache  . Codeine Nausea Only and Other (See Comments)    Other Reaction: confusion  . Codeine Phosphate Itching and Nausea Only  . Morphine Sulfate Itching  . Paroxetine Other (See Comments)    Paresthesias:  R arm, R leg, r side of face  . Rabeprazole Other (See Comments)    headache    Past Medical History, Surgical history, Social history, and Family History were reviewed and updated.  Review of Systems: Review of Systems  Constitutional: Negative.   HENT: Negative.   Eyes: Negative.   Respiratory: Negative.   Cardiovascular: Negative.   Gastrointestinal: Negative.   Genitourinary: Negative.   Musculoskeletal: Negative.   Skin: Negative.   Neurological: Negative.   Endo/Heme/Allergies: Negative.   Psychiatric/Behavioral: Negative.     Physical Exam:  weight is 203 lb (92.1 kg). His temporal temperature is 97.8 F (36.6 C). His blood pressure is 113/78 and his pulse is 77. His respiration is 18 and oxygen saturation is 100%.   Physical Exam Vitals signs reviewed.  Constitutional:      Comments: Axillary exam does show some mild axillary adenopathy.  This is mostly in the left axilla.  I really cannot palpate any lymph nodes in the right axilla.    HENT:     Head: Normocephalic and atraumatic.  Eyes:     Pupils: Pupils are equal, round, and reactive to light.  Neck:     Musculoskeletal: Normal range of motion.     Comments: On his neck exam, he does have adenopathy in the left supraclavicular region.  Lymph nodes are mobile.  They probably measure about 5-8 mm.  I cannot palpate any adenopathy in the right neck. Cardiovascular:     Rate and Rhythm: Normal rate and regular rhythm.     Heart sounds: Normal heart sounds.  Pulmonary:     Effort: Pulmonary effort is normal.     Breath sounds: Normal breath sounds.  Abdominal:     General: Bowel sounds are normal.     Palpations: Abdomen is soft.  Musculoskeletal: Normal range of motion.        General: No tenderness or deformity.  Lymphadenopathy:     Cervical: No cervical adenopathy.  Skin:    General: Skin is warm and dry.     Findings: No erythema or rash.  Neurological:     Mental Status: He is alert and oriented to  person, place, and time.  Psychiatric:        Behavior: Behavior normal.        Thought Content: Thought content normal.        Judgment: Judgment normal.     Lab Results  Component Value Date   WBC 14.1 (H) 10/05/2019   HGB 13.4 10/05/2019   HCT 40.0 10/05/2019   MCV 97.3 10/05/2019   PLT 122 (L) 10/05/2019  Chemistry      Component Value Date/Time   NA 138 10/05/2019 1453   NA 145 07/22/2017 0756   NA 139 06/27/2016 1410   K 3.7 10/05/2019 1453   K 4.3 07/22/2017 0756   K 5.1 06/27/2016 1410   CL 100 10/05/2019 1453   CL 105 07/22/2017 0756   CO2 30 10/05/2019 1453   CO2 30 07/22/2017 0756   CO2 28 06/27/2016 1410   BUN 22 10/05/2019 1453   BUN 17 07/22/2017 0756   BUN 20.9 06/27/2016 1410   CREATININE 1.44 (H) 10/05/2019 1453   CREATININE 1.6 (H) 07/22/2017 0756   CREATININE 1.6 (H) 06/27/2016 1410      Component Value Date/Time   CALCIUM 9.3 10/05/2019 1453   CALCIUM 9.6 07/22/2017 0756   CALCIUM 9.6 06/27/2016 1410   ALKPHOS 48 10/05/2019 1453   ALKPHOS 59 07/22/2017 0756   ALKPHOS 62 06/27/2016 1410   AST 17 10/05/2019 1453   AST 18 06/27/2016 1410   ALT 12 10/05/2019 1453   ALT 24 07/22/2017 0756   ALT 24 06/27/2016 1410   BILITOT 0.4 10/05/2019 1453   BILITOT 0.46 06/27/2016 1410      Impression and Plan: Mr. Ruback is 73 year-old African-American gentleman. He has recurrent pulmonary embolism. I think he needs lifelong anticoagulation.He is doing very well on Xarelto. He enjoys being on Xarelto.  Thankfully, I think that the CLL is holding steady.  The lymphadenopathy really does not bother me that much since he is asymptomatic with this.  I think that if he needs any type of cardiac intervention, I do not see any contraindication to him with respect to the CLL.  I does do not think that the CLL is going to be an active issue for a while.  I told him that we would repeat the CT of the chest probably in January.  I think this would be a good time  frame for repeating the CT scan so we can see if there is any further changes in his lymphadenopathy.  He would be amenable to this.   I spent about 35 minutes with him today.  We went over the lab work.  I went over the CAT scans with him.  I try to reassure him that I just do not see anything that looked "bulky" with his lymph nodes and that we just do not have to embark upon any intervention right now with systemic therapy.  He felt much better after we talked.Marland Kitchen    Marland KitchenVolanda Napoleon, MD 11/24/20203:46 PM

## 2019-10-06 ENCOUNTER — Telehealth: Payer: Self-pay | Admitting: Hematology & Oncology

## 2019-10-06 ENCOUNTER — Other Ambulatory Visit: Payer: Self-pay

## 2019-10-06 LAB — LACTATE DEHYDROGENASE: LDH: 298 U/L — ABNORMAL HIGH (ref 98–192)

## 2019-10-06 MED ORDER — WARFARIN SODIUM 5 MG PO TABS: ORAL_TABLET | ORAL | 0 refills | Status: DC

## 2019-10-06 NOTE — Telephone Encounter (Signed)
Appointments scheduled letter/calendar mailed per 11/25 los

## 2019-10-07 LAB — IGG, IGA, IGM
IgA: 92 mg/dL (ref 61–437)
IgG (Immunoglobin G), Serum: 704 mg/dL (ref 603–1613)
IgM (Immunoglobulin M), Srm: 82 mg/dL (ref 15–143)

## 2019-10-08 LAB — KAPPA/LAMBDA LIGHT CHAINS
Kappa free light chain: 44.4 mg/L — ABNORMAL HIGH (ref 3.3–19.4)
Kappa, lambda light chain ratio: 3.89 — ABNORMAL HIGH (ref 0.26–1.65)
Lambda free light chains: 11.4 mg/L (ref 5.7–26.3)

## 2019-10-08 LAB — PROTEIN ELECTROPHORESIS, SERUM, WITH REFLEX
A/G Ratio: 1.7 (ref 0.7–1.7)
Albumin ELP: 4 g/dL (ref 2.9–4.4)
Alpha-1-Globulin: 0.2 g/dL (ref 0.0–0.4)
Alpha-2-Globulin: 0.5 g/dL (ref 0.4–1.0)
Beta Globulin: 1 g/dL (ref 0.7–1.3)
Gamma Globulin: 0.6 g/dL (ref 0.4–1.8)
Globulin, Total: 2.3 g/dL (ref 2.2–3.9)
Total Protein ELP: 6.3 g/dL (ref 6.0–8.5)

## 2019-10-13 ENCOUNTER — Other Ambulatory Visit: Payer: Self-pay

## 2019-10-13 ENCOUNTER — Ambulatory Visit (INDEPENDENT_AMBULATORY_CARE_PROVIDER_SITE_OTHER): Payer: Medicare Other | Admitting: *Deleted

## 2019-10-13 DIAGNOSIS — Z5181 Encounter for therapeutic drug level monitoring: Secondary | ICD-10-CM | POA: Diagnosis not present

## 2019-10-13 DIAGNOSIS — Z952 Presence of prosthetic heart valve: Secondary | ICD-10-CM

## 2019-10-13 DIAGNOSIS — I2699 Other pulmonary embolism without acute cor pulmonale: Secondary | ICD-10-CM

## 2019-10-13 DIAGNOSIS — I480 Paroxysmal atrial fibrillation: Secondary | ICD-10-CM | POA: Diagnosis not present

## 2019-10-13 LAB — POCT INR: INR: 2.3 (ref 2.0–3.0)

## 2019-10-13 NOTE — Patient Instructions (Signed)
Description   Continue taking 1.5 tablets daily except 2 tablets on Tuesdays and Saturdays. Recheck INR in 2 weeks. [facility] [phone] Main [phone]

## 2019-10-14 ENCOUNTER — Other Ambulatory Visit: Payer: Medicare Other

## 2019-10-14 ENCOUNTER — Ambulatory Visit: Payer: Medicare Other | Admitting: Hematology & Oncology

## 2019-10-19 ENCOUNTER — Ambulatory Visit: Payer: Medicare Other | Admitting: Hematology & Oncology

## 2019-10-19 ENCOUNTER — Other Ambulatory Visit (HOSPITAL_BASED_OUTPATIENT_CLINIC_OR_DEPARTMENT_OTHER): Payer: Medicare Other

## 2019-10-19 ENCOUNTER — Other Ambulatory Visit: Payer: Medicare Other

## 2019-10-22 ENCOUNTER — Other Ambulatory Visit: Payer: Self-pay | Admitting: Family Medicine

## 2019-10-27 ENCOUNTER — Other Ambulatory Visit: Payer: Self-pay

## 2019-10-27 ENCOUNTER — Ambulatory Visit: Payer: Medicare Other | Admitting: *Deleted

## 2019-10-27 DIAGNOSIS — I480 Paroxysmal atrial fibrillation: Secondary | ICD-10-CM

## 2019-10-27 DIAGNOSIS — I2699 Other pulmonary embolism without acute cor pulmonale: Secondary | ICD-10-CM | POA: Diagnosis not present

## 2019-10-27 DIAGNOSIS — Z952 Presence of prosthetic heart valve: Secondary | ICD-10-CM

## 2019-10-27 DIAGNOSIS — Z5181 Encounter for therapeutic drug level monitoring: Secondary | ICD-10-CM | POA: Diagnosis not present

## 2019-10-27 LAB — POCT INR: INR: 2.2 (ref 2.0–3.0)

## 2019-10-27 NOTE — Patient Instructions (Signed)
Description   Continue taking 1.5 tablets daily except 2 tablets on Tuesdays and Saturdays. Recheck INR in 3 weeks. [facility] [phone] Main [phone]

## 2019-11-01 ENCOUNTER — Telehealth: Payer: Self-pay | Admitting: Family Medicine

## 2019-11-01 NOTE — Telephone Encounter (Signed)
I left a message asking the patient to call and schedule Medicare AWV with Loma Sousa ([place]).  If patient calls back, please schedule Medicare Wellness Visit at next available opening. Last AWV 03/20/18 [facility] ([place])

## 2019-11-04 ENCOUNTER — Ambulatory Visit: Payer: Medicare Other | Admitting: Psychology

## 2019-11-15 ENCOUNTER — Ambulatory Visit (INDEPENDENT_AMBULATORY_CARE_PROVIDER_SITE_OTHER): Payer: Medicare Other

## 2019-11-15 ENCOUNTER — Other Ambulatory Visit: Payer: Self-pay

## 2019-11-15 DIAGNOSIS — Z0000 Encounter for general adult medical examination without abnormal findings: Secondary | ICD-10-CM

## 2019-11-15 NOTE — Patient Instructions (Signed)
[name] , Thank you for taking time to come for your Medicare Wellness Visit. I appreciate your ongoing commitment to your health goals. Please review the following plan we discussed and let me know if I can assist you in the future.   Screening recommendations/referrals: Colorectal Screening: up to date; last colonoscopy 318/14  Vision and Dental Exams: Recommended annual ophthalmology exams for early detection of glaucoma and other disorders of the eye Recommended annual dental exams for proper oral hygiene  Vaccinations: Influenza vaccine: completed 06/24/19 Pneumococcal vaccine: up to date; last 01/23/15 Tdap vaccine: up to date; last 04/14/18  Shingles vaccine: Please call your insurance company to determine your out of pocket expense for the Shingrix vaccine. You may receive this vaccine at your local pharmacy. (see attached information)  Advanced directives: Please bring a copy of your POA (Power of Attorney) and/or Living Will to your next appointment.  Goals: Recommend to drink at least 6-8 8oz glasses of water per day and consume a balanced diet rich in fresh fruits and vegetables.   Next appointment: Please schedule your Annual Wellness Visit with your Nurse Health Advisor in one year.  Preventive Care 74 Years and Older, Male Preventive care refers to lifestyle choices and visits with your health care provider that can promote health and wellness. What does preventive care include?  A yearly physical exam. This is also called an annual well check.  Dental exams once or twice a year.  Routine eye exams. Ask your health care provider how often you should have your eyes checked.  Personal lifestyle choices, including:  Daily care of your teeth and gums.  Regular physical activity.  Eating a healthy diet.  Avoiding tobacco and drug use.  Limiting alcohol use.  Practicing safe sex.  Taking low doses of aspirin every day if recommended by your health care  provider..  Taking vitamin and mineral supplements as recommended by your health care provider. What happens during an annual well check? The services and screenings done by your health care provider during your annual well check will depend on your age, overall health, lifestyle risk factors, and family history of disease. Counseling  Your health care provider may ask you questions about your:  Alcohol use.  Tobacco use.  Drug use.  Emotional well-being.  Home and relationship well-being.  Sexual activity.  Eating habits.  History of falls.  Memory and ability to understand (cognition).  Work and work Statistician. Screening  You may have the following tests or measurements:  Height, weight, and BMI.  Blood pressure.  Lipid and cholesterol levels. These may be checked every 5 years, or more frequently if you are over 69 years old.  Skin check.  Lung cancer screening. You may have this screening every year starting at age 74 if you have a 30-pack-year history of smoking and currently smoke or have quit within the past 15 years.  Fecal occult blood test (FOBT) of the stool. You may have this test every year starting at age 74.  Flexible sigmoidoscopy or colonoscopy. You may have a sigmoidoscopy every 5 years or a colonoscopy every 10 years starting at age 74.  Prostate cancer screening. Recommendations will vary depending on your family history and other risks.  Hepatitis C blood test.  Hepatitis B blood test.  Sexually transmitted disease (STD) testing.  Diabetes screening. This is done by checking your blood sugar (glucose) after you have not eaten for a while (fasting). You may have this done every 1-3 years.  Abdominal aortic aneurysm (AAA) screening. You may need this if you are a current or former smoker.  Osteoporosis. You may be screened starting at age 74 if you are at high risk. Talk with your health care provider about your test results, treatment  options, and if necessary, the need for more tests. Vaccines  Your health care provider may recommend certain vaccines, such as:  Influenza vaccine. This is recommended every year.  Tetanus, diphtheria, and acellular pertussis (Tdap, Td) vaccine. You may need a Td booster every 10 years.  Zoster vaccine. You may need this after age 74.  Pneumococcal 13-valent conjugate (PCV13) vaccine. One dose is recommended after age 74.  Pneumococcal polysaccharide (PPSV23) vaccine. One dose is recommended after age 74. Talk to your health care provider about which screenings and vaccines you need and how often you need them. This information is not intended to replace advice given to you by your health care provider. Make sure you discuss any questions you have with your health care provider. Document Released: 11/24/2015 Document Revised: 07/17/2016 Document Reviewed: 08/29/2015 Elsevier Interactive Patient Education  2017 [place] Prevention in the Home Falls can cause injuries. They can happen to people of all ages. There are many things you can do to make your home safe and to help prevent falls. What can I do on the outside of my home?  Regularly fix the edges of walkways and driveways and fix any cracks.  Remove anything that might make you trip as you walk through a door, such as a raised step or threshold.  Trim any bushes or trees on the path to your home.  Use bright outdoor lighting.  Clear any walking paths of anything that might make someone trip, such as rocks or tools.  Regularly check to see if handrails are loose or broken. Make sure that both sides of any steps have handrails.  Any raised decks and porches should have guardrails on the edges.  Have any leaves, snow, or ice cleared regularly.  Use sand or salt on walking paths during winter.  Clean up any spills in your garage right away. This includes oil or grease spills. What can I do in the  bathroom?  Use night lights.  Install grab bars by the toilet and in the tub and shower. Do not use towel bars as grab bars.  Use non-skid mats or decals in the tub or shower.  If you need to sit down in the shower, use a plastic, non-slip stool.  Keep the floor dry. Clean up any water that spills on the floor as soon as it happens.  Remove soap buildup in the tub or shower regularly.  Attach bath mats securely with double-sided non-slip rug tape.  Do not have throw rugs and other things on the floor that can make you trip. What can I do in the bedroom?  Use night lights.  Make sure that you have a light by your bed that is easy to reach.  Do not use any sheets or blankets that are too big for your bed. They should not hang down onto the floor.  Have a firm chair that has side arms. You can use this for support while you get dressed.  Do not have throw rugs and other things on the floor that can make you trip. What can I do in the kitchen?  Clean up any spills right away.  Avoid walking on wet floors.  Keep items that you use  a lot in easy-to-reach places.  If you need to reach something above you, use a strong step stool that has a grab bar.  Keep electrical cords out of the way.  Do not use floor polish or wax that makes floors slippery. If you must use wax, use non-skid floor wax.  Do not have throw rugs and other things on the floor that can make you trip. What can I do with my stairs?  Do not leave any items on the stairs.  Make sure that there are handrails on both sides of the stairs and use them. Fix handrails that are broken or loose. Make sure that handrails are as long as the stairways.  Check any carpeting to make sure that it is firmly attached to the stairs. Fix any carpet that is loose or worn.  Avoid having throw rugs at the top or bottom of the stairs. If you do have throw rugs, attach them to the floor with carpet tape.  Make sure that you have a  light switch at the top of the stairs and the bottom of the stairs. If you do not have them, ask someone to add them for you. What else can I do to help prevent falls?  Wear shoes that:  Do not have high heels.  Have rubber bottoms.  Are comfortable and fit you well.  Are closed at the toe. Do not wear sandals.  If you use a stepladder:  Make sure that it is fully opened. Do not climb a closed stepladder.  Make sure that both sides of the stepladder are locked into place.  Ask someone to hold it for you, if possible.  Clearly mark and make sure that you can see:  Any grab bars or handrails.  First and last steps.  Where the edge of each step is.  Use tools that help you move around (mobility aids) if they are needed. These include:  Canes.  Walkers.  Scooters.  Crutches.  Turn on the lights when you go into a dark area. Replace any light bulbs as soon as they burn out.  Set up your furniture so you have a clear path. Avoid moving your furniture around.  If any of your floors are uneven, fix them.  If there are any pets around you, be aware of where they are.  Review your medicines with your doctor. Some medicines can make you feel dizzy. This can increase your chance of falling. Ask your doctor what other things that you can do to help prevent falls. This information is not intended to replace advice given to you by your health care provider. Make sure you discuss any questions you have with your health care provider. Document Released: 08/24/2009 Document Revised: 04/04/2016 Document Reviewed: 12/02/2014 Elsevier Interactive Patient Education  2017 [employer].

## 2019-11-15 NOTE — Progress Notes (Signed)
This visit is being conducted via phone call due to the COVID-19 pandemic. This patient has given me verbal consent via phone to conduct this visit, patient states they are participating from their home address. Some vital signs may be absent or patient reported.   Patient identification: identified by name, DOB, and current address.  Location provider: [facility], Office Persons participating in the virtual visit: Denman George LPN, patient, and Dr. Garret Reddish     Subjective:   [name] is a 74 y.o. male who presents for Medicare Annual/Subsequent preventive examination.  Review of Systems:   Cardiac Risk Factors include: advanced age (>4mn, >>58women);male gender;hypertension    Objective:    Vitals: There were no vitals taken for this visit.  There is no height or weight on file to calculate BMI.  Advanced Directives 11/15/2019 10/05/2019 08/16/2019 04/13/2019 03/02/2019 02/25/2019 12/14/2018  Does Patient Have a Medical Advance Directive? Yes Yes Yes Yes No No Yes  Type of Advance Directive Living will [place]Living will [place] will [place]road Top CityLiving will - - [place]Living will  Does patient want to make changes to medical advance directive? No - Patient declined No - Patient declined - - - - -  Copy of HPress photographerin Chart? - - - No - copy requested - - -  Would patient like information on creating a medical advance directive? - - No - Patient declined No - Patient declined No - Patient declined - -    Tobacco Social History   Tobacco Use  Smoking Status Former Smoker  . Packs/day: 1.00  . Years: 12.00  . Pack years: 12.00  . Types: Cigarettes  . Start date: 11/28/1968  . Quit date: 01/29/1981  . Years since quitting: 38.8  Smokeless Tobacco Never Used  Tobacco Comment   quit 74yo      Counseling given: Not Answered Comment: quit 74yo    Clinical  Intake:  Pre-visit preparation completed: Yes  Pain : No/denies pain  Diabetes: No  How often do you need to have someone help you when you read instructions, pamphlets, or other written materials from your doctor or pharmacy?: 1 - Never  Interpreter Needed?: No  Information entered by :: CDenman GeorgeLPN  Past Medical History:  Diagnosis Date  . Anticoagulants causing adverse effect in therapeutic use   . Anxiety    Paxil seemed to cause odd neuro side effects; better on prozac as of 09/2015  . Aortic regurgitation 04/2007 surgery   [facility] . Cervical spondylosis    ESI by Dr. WJacelyn [facility]rip[facility]. 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 Chronic lymphocytic leukemia (CLL), B-cell [place]   stable on f/u's with Dr. EMarin Olp(most recent 07/2017)  . History of prostate cancer 2003   Rad prost  . History of pulmonary embolism 2011; 10/2014   Recurrence off of anticoagulation 10/2014: per hem/onc (Dr. EMarin Olp pt needs lifelong anticoagulation (hypercoag w/u neg).  . Hyperlipidemia    statin-intolerant, except for crestor low dose.  .Marland KitchenHypertension   . Migraine syndrome   . Mitral regurgitation   . PA[place]oxysmal [place]   . Panic attacks    "           "             "                  "                  "                       "                             "                         .  Pulmonary nodule, right 2015   RLL: resected at [facility] --[facility]; Benign RLL nodule (fungal and AFB stains neg).  Plan is for [facility] thoracic surg to do f/u o/v & CT chest 1 yr   . Right-sided headache 07/2016   Primary stabbing HA or migraine per neuro (Dr. Tomi Likens).  Gabapentin and topamax not tolerated.  These resolved spontaneously.   Past Surgical History:  Procedure Laterality Date  . AORTIC VALVE REPLACEMENT  2011   [facility]  (bioprosthetic)  . CARDIAC CATHETERIZATION  04/2010   Normal coronaries.  . Carotid dopplers  08/2015   NORMAL  . CATARACT  EXTRACTION     L 12/06/09   R 09/14/09  . COLONOSCOPY  01/26/13   tic's, o/w normal.  ([facility])--recall 10 yrs.  Marland Kitchen PENILE PROSTHESIS IMPLANT    . PROSTATECTOMY  04/2002   for prostate cancer  . Pulmonary nodule resection  Fall/winter 2016   [facility]  . SHOULDER SURGERY     rotator cuff on both arms   . TIBIALIS TENDON TRANSFER / REPAIR Right 03/02/2019   Procedure: RECONSTRUCTION OF RIGHT  ANTERIOR TIBIALIS TENDON;  Surgeon: Erle Crocker, MD;  Location: [facility];  Service: Orthopedics;  Laterality: Right;  . TRANSTHORACIC ECHOCARDIOGRAM  09/20/2014; 03/2016; 03/2017   2015-mild LVH, EF 50-55%, wall motion nl, grade I diast dysfxn, prosth aort valve good,transaortic gradients decreased compared to echo 11/2013.   03/2016: EF 55-60%, normal LV function, severe LVH, normal diast fxn, mild increase in AV gradient compared to 09/2014 echo.  2018: EF 55-60%, normal LV fxn, grd II DD, AV stable/gradient's stable.   Family History  Problem Relation Age of Onset  . Cancer Sister        uterine  . Colon cancer Sister 18  . Stroke Mother   . Prostate cancer Father   . Stroke Sister   . Stroke Brother   . Heart attack Neg Hx    Social History   Socioeconomic History  . Marital status: Single    Spouse name: Not on file  . Number of children: Not on file  . Years of education: Not on file  . Highest education level: Not on file  Occupational History    Comment: Retired - Tobacco   Tobacco Use  . Smoking status: Former Smoker    Packs/day: 1.00    Years: 12.00    Pack years: 12.00    Types: Cigarettes    Start date: 11/28/1968    Quit date: 01/29/1981    Years since quitting: 38.8  . Smokeless tobacco: Never Used  . Tobacco comment: quit 74 yo   Substance and Sexual Activity  . Alcohol use: Yes    Alcohol/week: 0.0 standard drinks    Comment: occasional  . Drug use: No  . Sexual activity: Not on file  Other Topics Concern  . Not on file  Social History Narrative    Single. No children. Lives alone      Retired from [employer].       Hobbies: travel, Scientist, product/process development, enjoys being outdoors including hiking   Social Determinants of Health   Financial Resource Strain:   . Difficulty of Paying Living Expenses: Not on file  Food Insecurity:   . Worried About Charity fundraiser in the Last Year: Not on file  . Ran Out of Food in the Last Year: Not on file  Transportation Needs:   . Lack of Transportation (Medical): Not on file  .  Lack of Transportation (Non-Medical): Not on file  Physical Activity:   . Days of Exercise per Week: Not on file  . Minutes of Exercise per Session: Not on file  Stress:   . Feeling of Stress : Not on file  Social Connections:   . Frequency of Communication with Friends and Family: Not on file  . Frequency of Social Gatherings with Friends and Family: Not on file  . Attends Religious Services: Not on file  . Active Member of Clubs or Organizations: Not on file  . Attends Archivist Meetings: Not on file  . Marital Status: Not on file    Outpatient Encounter Medications as of 11/15/2019  Medication Sig  . acetaminophen (TYLENOL) 500 MG tablet Take 2 tablets (1,000 mg total) by mouth every 6 (six) hours as needed.  . cholecalciferol (VITAMIN D) 1000 units tablet Take 1,000 Units by mouth daily.  . hydrochlorothiazide (HYDRODIURIL) 12.5 MG tablet TAKE 1 TABLET BY MOUTH EVERY DAY  . LORazepam (ATIVAN) 0.5 MG tablet TAKE 1 TABLET BY MOUTH EVERY DAY AS NEEDED FOR ANXIETY  . meclizine (ANTIVERT) 12.5 MG tablet TAKE 1 TABLET EVERY DAY AS NEEDED FOR DIZZINESS  . metoprolol tartrate (LOPRESSOR) 100 MG tablet Take 1 tablet 2 hours prior to CT test  . rosuvastatin (CRESTOR) 10 MG tablet TAKE 1 TABLET BY MOUTH EVERY DAY  . sertraline (ZOLOFT) 100 MG tablet Take 100 mg by mouth daily.  Marland Kitchen warfarin (COUMADIN) 5 MG tablet Take as directed by [facility]   No facility-administered encounter  medications on file as of 11/15/2019.    Activities of Daily Living In your present state of health, do you have any difficulty performing the following activities: 11/15/2019 03/02/2019  Hearing? N N  Vision? N N  Difficulty concentrating or making decisions? N N  Walking or climbing stairs? N Y  Comment - due to dx  Dressing or bathing? N N  Doing errands, shopping? N -  Preparing Food and eating ? N -  Using the Toilet? N -  In the past six months, have you accidently leaked urine? N -  Do you have problems with loss of bowel control? N -  Managing your Medications? N -  Managing your Finances? N -  Housekeeping or managing your Housekeeping? N -  Some recent data might be hidden    Patient Care Team: [name], MD as PCP - General (Family Medicine) Josue Hector, MD as PCP - Cardiology (Cardiology) Ladell Pier, MD as Consulting Physician (Oncology) Josue Hector, MD as Consulting Physician (Cardiology) Chesley Mires, MD as Consulting Physician (Pulmonary Disease) [name] Rudell Cobb, MD as Consulting Physician (Oncology) Pieter Partridge, DO as Consulting Physician (Neurology) Everitt Amber, MD as Consulting Physician (Ophthalmology) Rutherford Guys, MD as Consulting Physician (Ophthalmology)   Assessment:   This is a routine wellness examination for [name].  Exercise Activities and Dietary recommendations Current Exercise Habits: Home exercise routine;Structured exercise class, Type of exercise: Other - see comments;yoga(Tai-Chi), Time (Minutes): 45, Frequency (Times/Week): 5, Weekly Exercise (Minutes/Week): 225, Intensity: Moderate  Goals    . Maintain current health    . patient     Traveling soon! Do more photography!        Fall Risk Fall Risk  11/15/2019 04/22/2019 03/20/2018 05/21/2017 02/21/2017  Falls in the past year? 0 0 No No No  Number falls in past yr: - - - - -  Injury with Fall? 0 - - - -  Risk for fall due to : - - - - -  Follow up Falls  evaluation completed;Education provided;Falls prevention discussed Falls evaluation completed - - -   Is the patient's home free of loose throw rugs in walkways, pet beds, electrical cords, etc?   yes      Grab bars in the bathroom? yes      Handrails on the stairs?   yes      Adequate lighting?   yes  Depression Screen PHQ 2/9 Scores 11/15/2019 04/22/2019 03/20/2018 02/21/2017  PHQ - 2 Score 0 1 0 0  PHQ- 9 Score - 3 0 -    Cognitive Function- no cognitive concerns at this time  Cognitive Testing  Alert? Yes         Normal Appearance? N/a  Oriented to person? Yes           Place? Yes  Time? Yes  Recall of three objects? Yes  Can perform simple calculations? Yes  Displays appropriate judgment? Yes  Can read the correct time from a watch face? Yes   MMSE - Mini Mental State Exam 02/21/2017  Not completed: (No Data)        Immunization History  Administered Date(s) Administered  . Influenza Split 08/16/2011, 08/14/2012  . Influenza Whole 08/17/2010  . Influenza, High Dose Seasonal PF 08/23/2013, 08/29/2016, 08/11/2017, 06/24/2019  . Influenza,inj,Quad PF,6+ Mos 09/05/2014, 08/02/2015  . Influenza-Unspecified 06/29/2018  . Pneumococcal Conjugate-13 01/23/2015  . Pneumococcal Polysaccharide-23 03/15/2011  . Td 04/16/2007  . Tdap 04/14/2018  . Zoster 08/16/2011    Qualifies for Shingles Vaccine? Discussed and patient will check with pharmacy for coverage.  Patient education handout provided   Screening Tests Health Maintenance  Topic Date Due  . COLONOSCOPY  01/27/2023  . TETANUS/TDAP  04/14/2028  . INFLUENZA VACCINE  Completed  . Hepatitis C Screening  Completed  . PNA vac Low Risk Adult  Completed   Cancer Screenings: Lung: Low Dose CT Chest recommended if Age 74-80 years, 30 pack-year currently smoking OR have quit w/in 15years. Patient does not qualify. Colorectal: colonoscopy 01/26/13      Plan:  I have personally reviewed and addressed the Medicare Annual Wellness  questionnaire and have noted the following in the patient's chart:  A. Medical and social history B. Use of alcohol, tobacco or illicit drugs  C. Current medications and supplements D. Functional ability and status E.  Nutritional status F.  Physical activity G. Advance directives H. List of other physicians I.  Hospitalizations, surgeries, and ER visits in previous 12 months J.  Ross such as hearing and vision if needed, cognitive and depression L. Referrals, records requested, and appointments- none   In addition, I have reviewed and discussed with patient certain preventive protocols, quality metrics, and best practice recommendations. A written personalized care plan for preventive services as well as general preventive health recommendations were provided to patient.   Signed,  Denman George, LPN  Nurse Health Advisor   Nurse Notes: no additional

## 2019-11-18 ENCOUNTER — Other Ambulatory Visit: Payer: Self-pay

## 2019-11-18 ENCOUNTER — Ambulatory Visit: Payer: Medicare Other | Admitting: *Deleted

## 2019-11-18 DIAGNOSIS — Z5181 Encounter for therapeutic drug level monitoring: Secondary | ICD-10-CM | POA: Diagnosis not present

## 2019-11-18 DIAGNOSIS — Z952 Presence of prosthetic heart valve: Secondary | ICD-10-CM | POA: Diagnosis not present

## 2019-11-18 DIAGNOSIS — I480 Paroxysmal atrial fibrillation: Secondary | ICD-10-CM | POA: Diagnosis not present

## 2019-11-18 DIAGNOSIS — I2699 Other pulmonary embolism without acute cor pulmonale: Secondary | ICD-10-CM

## 2019-11-18 LAB — POCT INR: INR: 2 (ref 2.0–3.0)

## 2019-11-18 NOTE — Patient Instructions (Signed)
Description   Continue taking 1.5 tablets daily except 2 tablets on Tuesdays and Saturdays. Recheck INR in 4 weeks. [facility] [phone] Main [phone]

## 2019-11-24 ENCOUNTER — Encounter: Payer: Self-pay | Admitting: Hematology & Oncology

## 2019-11-25 ENCOUNTER — Inpatient Hospital Stay: Payer: Medicare Other | Attending: Hematology & Oncology

## 2019-11-25 ENCOUNTER — Other Ambulatory Visit: Payer: Self-pay

## 2019-11-25 ENCOUNTER — Ambulatory Visit (HOSPITAL_BASED_OUTPATIENT_CLINIC_OR_DEPARTMENT_OTHER)
Admission: RE | Admit: 2019-11-25 | Discharge: 2019-11-25 | Disposition: A | Discharge status: Home / Self Care | Payer: Medicare Other | Source: Ambulatory Visit | Attending: Hematology & Oncology | Admitting: Hematology & Oncology

## 2019-11-25 ENCOUNTER — Inpatient Hospital Stay (HOSPITAL_BASED_OUTPATIENT_CLINIC_OR_DEPARTMENT_OTHER): Payer: Medicare Other | Admitting: Hematology & Oncology

## 2019-11-25 ENCOUNTER — Encounter (HOSPITAL_BASED_OUTPATIENT_CLINIC_OR_DEPARTMENT_OTHER): Payer: Self-pay

## 2019-11-25 ENCOUNTER — Encounter: Payer: Self-pay | Admitting: Hematology & Oncology

## 2019-11-25 VITALS — BP 139/89 | HR 63 | Temp 97.3°F | Resp 20 | Wt 201.1 lb

## 2019-11-25 DIAGNOSIS — I2699 Other pulmonary embolism without acute cor pulmonale: Secondary | ICD-10-CM | POA: Diagnosis not present

## 2019-11-25 DIAGNOSIS — C9110 Chronic lymphocytic leukemia of B-cell type not having achieved remission: Secondary | ICD-10-CM | POA: Diagnosis not present

## 2019-11-25 DIAGNOSIS — R911 Solitary pulmonary nodule: Secondary | ICD-10-CM | POA: Insufficient documentation

## 2019-11-25 DIAGNOSIS — Z7901 Long term (current) use of anticoagulants: Secondary | ICD-10-CM | POA: Insufficient documentation

## 2019-11-25 LAB — CMP (CANCER CENTER ONLY)
ALT: 14 U/L (ref 0–44)
AST: 18 U/L (ref 15–41)
Albumin: 4.7 g/dL (ref 3.5–5.0)
Alkaline Phosphatase: 58 U/L (ref 38–126)
Anion gap: 6 (ref 5–15)
BUN: 25 mg/dL — ABNORMAL HIGH (ref 8–23)
CO2: 29 mmol/L (ref 22–32)
Calcium: 9.7 mg/dL (ref 8.9–10.3)
Chloride: 103 mmol/L (ref 98–111)
Creatinine: 1.53 mg/dL — ABNORMAL HIGH (ref 0.61–1.24)
GFR, Est AFR Am: 52 mL/min — ABNORMAL LOW (ref >60)
GFR, Estimated: 44 mL/min — ABNORMAL LOW (ref >60)
Glucose, Bld: 101 mg/dL — ABNORMAL HIGH (ref 70–99)
Potassium: 4.7 mmol/L (ref 3.5–5.1)
Sodium: 138 mmol/L (ref 135–145)
Total Bilirubin: 0.4 mg/dL (ref 0.3–1.2)
Total Protein: 6.7 g/dL (ref 6.5–8.1)

## 2019-11-25 LAB — CBC WITH DIFFERENTIAL (CANCER CENTER ONLY)
Abs Immature Granulocytes: 0.03 10*3/uL (ref 0.00–0.07)
Basophils Absolute: 0 10*3/uL (ref 0.0–0.1)
Basophils Relative: 0 %
Eosinophils Absolute: 0.1 10*3/uL (ref 0.0–0.5)
Eosinophils Relative: 0 %
HCT: 43 % (ref 39.0–52.0)
Hemoglobin: 14.3 g/dL (ref 13.0–17.0)
Immature Granulocytes: 0 %
Lymphocytes Relative: 86 %
Lymphs Abs: 17.5 10*3/uL — ABNORMAL HIGH (ref 0.7–4.0)
MCH: 32.6 pg (ref 26.0–34.0)
MCHC: 33.3 g/dL (ref 30.0–36.0)
MCV: 98.2 fL (ref 80.0–100.0)
Monocytes Absolute: 1.4 10*3/uL — ABNORMAL HIGH (ref 0.1–1.0)
Monocytes Relative: 7 %
Neutro Abs: 1.3 10*3/uL — ABNORMAL LOW (ref 1.7–7.7)
Neutrophils Relative %: 7 %
Platelet Count: 149 10*3/uL — ABNORMAL LOW (ref 150–400)
RBC: 4.38 MIL/uL (ref 4.22–5.81)
RDW: 12.8 % (ref 11.5–15.5)
WBC Count: 20.3 10*3/uL — ABNORMAL HIGH (ref 4.0–10.5)
nRBC: 0 % (ref 0.0–0.2)

## 2019-11-25 MED ORDER — IOHEXOL 300 MG/ML  SOLN
100.0000 mL | Freq: Once | INTRAMUSCULAR | Status: AC | PRN
  Administered 2019-11-25: 100 mL via INTRAVENOUS

## 2019-11-25 NOTE — Progress Notes (Signed)
Hematology and Oncology Follow Up Visit  [name] [id number] 09-18-1946 74 y.o. 11/25/2019   Principle Diagnosis:   Right lower lobe pulmonary nodule- non-malignant  Recurrent pulmonary embolism  CLL -- progressive -- 11q-/13q-  Current Therapy:    Xarelto 20 mg by mouth daily       Rituxan/bendamustine-s/p cycle #2 - given 06/18/2017      Interim History:  Mr.  Bell is back for follow-up.  So far, everything is going pretty well for him.  He had no problems over the holiday season.  The big news is that he is going to get a new puppy.  This will be a bearded collie.  He is actually getting it from a breeder in [place].  It sounds like the dog, which is a boy dog, will arrive in early February.  [name] had a CT scan done today.  This was of the chest.  Essentially, the CT scan showed stable adenopathy.  He is feeling well.  He is still exercising.  He is not missing his routine at all.  He is very diligent with keeping active and keeping in good shape.  He has had no problems with fever.  He has had no rashes.  He has not noted any lymph nodes that appear to be growing.  He has not yet had the COVID vaccine.  Hopefully, he will have this soon.  He has had no problems with bowels or bladder.  There is been no chest wall pain.  He has had no cough.  There has been no bleeding.  Overall, his performance status is ECOG 0.    Medications:  Current Outpatient Medications:  .  acetaminophen (TYLENOL) 500 MG tablet, Take 2 tablets (1,000 mg total) by mouth every 6 (six) hours as needed., Disp: 100 tablet, Rfl: 2 .  cholecalciferol (VITAMIN D) 1000 units tablet, Take 1,000 Units by mouth daily., Disp: , Rfl:  .  hydrochlorothiazide (HYDRODIURIL) 12.5 MG tablet, TAKE 1 TABLET BY MOUTH EVERY DAY, Disp: 90 tablet, Rfl: 1 .  LORazepam (ATIVAN) 0.5 MG tablet, TAKE 1 TABLET BY MOUTH EVERY DAY AS NEEDED FOR ANXIETY, Disp: 30 tablet, Rfl: 2 .  rosuvastatin (CRESTOR) 10 MG tablet,  TAKE 1 TABLET BY MOUTH EVERY DAY, Disp: 90 tablet, Rfl: 3 .  sertraline (ZOLOFT) 100 MG tablet, Take 100 mg by mouth daily., Disp: , Rfl:  .  warfarin (COUMADIN) 5 MG tablet, Take as directed by [facility] (Patient taking differently: Take as directed by [facility] 11/25/2019 Takes 1.5 tabs daily except Tuesday and Saturday then takes 2 tablets.), Disp: 150 tablet, Rfl: 0  Allergies:  Allergies  Allergen Reactions  . Hydrocodone-Acetaminophen Shortness Of Breath  . Oxycodone Hcl Shortness Of Breath  . Telmisartan-Hctz Shortness Of Breath and Palpitations    Dizziness, too  . Ramipril   . Aspirin Other (See Comments) and Rash    Other Reaction: confusion Can't take high dose  . Atorvastatin Other (See Comments)    myalgia  . Buspirone Hcl Other (See Comments)    headache  . Codeine Nausea Only and Other (See Comments)    Other Reaction: confusion  . Codeine Phosphate Itching and Nausea Only  . Morphine Sulfate Itching  . Paroxetine Other (See Comments)    Paresthesias: R arm, R leg, r side of face  . Rabeprazole Other (See Comments)    headache    Past Medical History, Surgical history, Social history, and Family History were reviewed and updated.  Review of Systems: Review of Systems  Constitutional: Negative.   HENT: Negative.   Eyes: Negative.   Respiratory: Negative.   Cardiovascular: Negative.   Gastrointestinal: Negative.   Genitourinary: Negative.   Musculoskeletal: Negative.   Skin: Negative.   Neurological: Negative.   Endo/Heme/Allergies: Negative.   Psychiatric/Behavioral: Negative.     Physical Exam:  weight is 201 lb 1.9 oz (91.2 kg). His temporal temperature is 97.3 F (36.3 C) (abnormal). His blood pressure is 139/89 and his pulse is 63. His respiration is 20 and oxygen saturation is 99%.   Physical Exam Vitals reviewed.  Constitutional:      Comments: Axillary exam does show some mild axillary adenopathy.  This is mostly in the left  axilla.  I really cannot palpate any lymph nodes in the right axilla.    HENT:     Head: Normocephalic and atraumatic.  Eyes:     Pupils: Pupils are equal, round, and reactive to light.  Neck:     Comments: On his neck exam, he does have adenopathy in the left supraclavicular region.  Lymph nodes are mobile.  They probably measure about 5-8 mm.  I cannot palpate any adenopathy in the right neck. Cardiovascular:     Rate and Rhythm: Normal rate and regular rhythm.     Heart sounds: Normal heart sounds.  Pulmonary:     Effort: Pulmonary effort is normal.     Breath sounds: Normal breath sounds.  Abdominal:     General: Bowel sounds are normal.     Palpations: Abdomen is soft.  Musculoskeletal:        General: No tenderness or deformity. Normal range of motion.     Cervical back: Normal range of motion.  Lymphadenopathy:     Cervical: No cervical adenopathy.  Skin:    General: Skin is warm and dry.     Findings: No erythema or rash.  Neurological:     Mental Status: He is alert and oriented to person, place, and time.  Psychiatric:        Behavior: Behavior normal.        Thought Content: Thought content normal.        Judgment: Judgment normal.     Lab Results  Component Value Date   WBC 20.3 (H) 11/25/2019   HGB 14.3 11/25/2019   HCT 43.0 11/25/2019   MCV 98.2 11/25/2019   PLT 149 (L) 11/25/2019     Chemistry      Component Value Date/Time   NA 138 11/25/2019 1404   NA 145 07/22/2017 0756   NA 139 06/27/2016 1410   K 4.7 11/25/2019 1404   K 4.3 07/22/2017 0756   K 5.1 06/27/2016 1410   CL 103 11/25/2019 1404   CL 105 07/22/2017 0756   CO2 29 11/25/2019 1404   CO2 30 07/22/2017 0756   CO2 28 06/27/2016 1410   BUN 25 (H) 11/25/2019 1404   BUN 17 07/22/2017 0756   BUN 20.9 06/27/2016 1410   CREATININE 1.53 (H) 11/25/2019 1404   CREATININE 1.6 (H) 07/22/2017 0756   CREATININE 1.6 (H) 06/27/2016 1410      Component Value Date/Time   CALCIUM 9.7 11/25/2019 1404    CALCIUM 9.6 07/22/2017 0756   CALCIUM 9.6 06/27/2016 1410   ALKPHOS 58 11/25/2019 1404   ALKPHOS 59 07/22/2017 0756   ALKPHOS 62 06/27/2016 1410   AST 18 11/25/2019 1404   AST 18 06/27/2016 1410   ALT 14 11/25/2019 1404  ALT 24 07/22/2017 0756   ALT 24 06/27/2016 1410   BILITOT 0.4 11/25/2019 1404   BILITOT 0.46 06/27/2016 1410      Impression and Plan: Mr. Iaquinto is 74 year-old African-American gentleman. He has recurrent pulmonary embolism. I think he needs lifelong anticoagulation.He is doing very well on Xarelto. He enjoys being on Xarelto.  Thankfully, I think that the CLL is holding steady.  The lymphadenopathy really does not bother me that much since he is asymptomatic with this.  Even better is the fact that the CT scan recently was relatively stable.  His blood counts also look pretty stable.  I know that the white cell count is up a little bit but his hemoglobin and platelet count are holding up.  I think we can get him back in 3 months now.  I think this would be reasonable.  He certainly knows he can call us if he has any problem before we see him back.  I do not see that we have to do any scans on him.  I hope that he has many happy years with his new puppy.  The pup's name is Information systems manager.     Marland KitchenVolanda Napoleon, MD 1/14/20213:52 PM

## 2019-11-26 ENCOUNTER — Telehealth: Payer: Self-pay | Admitting: Hematology & Oncology

## 2019-11-26 LAB — LACTATE DEHYDROGENASE: LDH: 309 U/L — ABNORMAL HIGH (ref 98–192)

## 2019-11-26 NOTE — Telephone Encounter (Signed)
Called and advised patient of upcoming appointments scheduled per 1/14 los

## 2019-11-30 ENCOUNTER — Encounter: Payer: Self-pay | Admitting: Family Medicine

## 2019-12-02 ENCOUNTER — Ambulatory Visit (INDEPENDENT_AMBULATORY_CARE_PROVIDER_SITE_OTHER): Payer: Medicare Other | Admitting: Psychology

## 2019-12-02 DIAGNOSIS — F4321 Adjustment disorder with depressed mood: Secondary | ICD-10-CM | POA: Diagnosis not present

## 2019-12-08 ENCOUNTER — Telehealth: Payer: Self-pay | Admitting: Pharmacist

## 2019-12-08 NOTE — Telephone Encounter (Signed)
INR range adjusted in Epic, will advise pt of INR range change at next INR check on 2/3.

## 2019-12-08 NOTE — Telephone Encounter (Signed)
[name] Hector, MD  Ramond Dial, RPH; Bhargav Barbaro, Harlon Flor, [facility]  Can we run his INR 2.5-3.0 concern for marantic process on aortic valve with increasing gradients and thrombus on valve as well as history of DVT/PE Seems to have progressive CLL

## 2019-12-08 NOTE — Progress Notes (Signed)
Date:  12/15/2019   ID:  [name], DOB 07-17-1946, MRN [id number]  Provider Location: Office  PCP:  [name], MD  Cardiologist:   [name] Electrophysiologist:  None   Evaluation Performed:  Follow-Up Visit  Chief Complaint:  AVR PAF   History of Present Illness:    74 y.o. with bioprosthetic AVR Dr [name] at [facility] July 2011 for severe AR No CAD. History of PAF and recurrent DVT/PE on chronic xarelto Also had LUE cephalic/basilic thrombus. CRF;s include HTN and HLD. Has CLL followed by Dr [name] post chemoRx. Also had radical prostatectomy for cancer In 2016 had RLL wedge resection for granulomatous benign nodule Has had sleep issues with nightmares and we have since stopped his beta blocker including lopressor and atenolol He is seen at [facility] for PTSD and uses Zoloft   Has been seen by [facility] and primary since our last visit Has some right wrist pain and stable lymphadenopathy. Had right foot reconstruction 03/02/19 involving the right gastrocnemius tendon for foot drop  Echo reviewed from 08/27/19 EF 60-65% mild MR AVR trivial PVL marked increase in gradients compared to 04/08/18 mean 39 mmHg peak 65 mmHg with DVI 0.26.  Given these findings patient had cardiac CTA to assess the valve CTA 09/15/19 with no CAD and normal aortic Root 3.5 cm. 25 mm CE bioprosthetic valve with HALT/HAM extensive involving the right and non coronary cusp He was thus started on coumadin instead of xarelto which he was on for his history of DVT/PE  Concern that this could represent a hypercoagulable state " marantic" in light of his CLL progression Seen by Dr [name] and CT chest with stable Adenopathy WBC was up to 20.3 11/25/19 from 14.1 but stable Hct/Plt's   TTE reviewed from today 12/15/19 Peak velocity 3.7 m/sec mean gradient  39->30 mmHg peak 65-> 55 mmHg AVA 0.79 cm2   He feels better Getting acupuncture and doing Tai Chi Weight down 8 lbs  Had first vaccine for COVID through [place]  The  patient  does not have symptoms concerning for COVID-19 infection (fever, chills, cough, or new shortness of breath).    Past Medical History:  Diagnosis Date  . Anticoagulants causing adverse effect in therapeutic use   . Anxiety    Paxil seemed to cause odd neuro side effects; better on prozac as of 09/2015  . Aortic regurgitation 04/2007 surgery   Resolved with tissue AVR at [facility]  . Cervical spondylosis    ESI by [name] in Ortho. 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 Chronic lymphocytic leukemia (CLL), B-cell ([place]) approx 2012   stable on f/u's with [name] (most recent 07/2017)  . History of prostate cancer 2003   Rad prost  . History of pulmonary embolism 2011; 10/2014   Recurrence off of anticoagulation 10/2014: per hem/onc ([name]) pt needs lifelong anticoagulation (hypercoag w/u neg).  . Hyperlipidemia    statin-intolerant, except for crestor low dose.  Marland Kitchen Hypertension   . Migraine syndrome   . Mitral regurgitation   . PAF (paroxysmal atrial fibrillation) ([place])   . Panic attacks    "           "             "                  "                  "                       "                             "                         .  Pulmonary nodule, right 2015   RLL: resected at [facility] --VATS; Benign RLL nodule (fungal and AFB stains neg).  Plan is for [facility] thoracic surg to do f/u o/v & CT chest 1 yr   . Right-sided headache 07/2016   Primary stabbing HA or migraine per neuro (Dr. Tomi Likens).  Gabapentin and topamax not tolerated.  These resolved spontaneously.   Past Surgical History:  Procedure Laterality Date  . AORTIC VALVE REPLACEMENT  2011   [facility]  (bioprosthetic)  . CARDIAC CATHETERIZATION  04/2010   Normal coronaries.  . Carotid dopplers  08/2015   NORMAL  . CATARACT EXTRACTION     L 12/06/09   R 09/14/09  . COLONOSCOPY  01/26/13   tic's, o/w normal.  ([place])--recall 10 yrs.  Marland Kitchen PENILE PROSTHESIS IMPLANT    . PROSTATECTOMY  04/2002   for prostate cancer  . Pulmonary  nodule resection  Fall/winter 2016   [facility]  . SHOULDER SURGERY     rotator cuff on both arms   . TIBIALIS TENDON TRANSFER / REPAIR Right 03/02/2019   Procedure: RECONSTRUCTION OF RIGHT  ANTERIOR TIBIALIS TENDON;  Surgeon: Erle Crocker, MD;  Location: [place];  Service: Orthopedics;  Laterality: Right;  . TRANSTHORACIC ECHOCARDIOGRAM  09/20/2014; 03/2016; 03/2017   2015-mild LVH, EF 50-55%, wall motion nl, grade I diast dysfxn, prosth aort valve good,transaortic gradients decreased compared to echo 11/2013.   03/2016: EF 55-60%, normal LV function, severe LVH, normal diast fxn, mild increase in AV gradient compared to 09/2014 echo.  2018: EF 55-60%, normal LV fxn, grd II DD, AV stable/gradient's stable.     No outpatient medications have been marked as taking for the 12/15/19 encounter (Appointment) with Josue Hector, MD.     Allergies:   Hydrocodone-acetaminophen, Oxycodone hcl, Telmisartan-hctz, Ramipril, Aspirin, Atorvastatin, Buspirone hcl, Codeine, Codeine phosphate, Morphine sulfate, Paroxetine, and Rabeprazole   Social History   Tobacco Use  . Smoking status: Former Smoker    Packs/day: 1.00    Years: 12.00    Pack years: 12.00    Types: Cigarettes    Start date: 11/28/1968    Quit date: 01/29/1981    Years since quitting: 38.9  . Smokeless tobacco: Never Used  . Tobacco comment: quit 74 yo   Substance Use Topics  . Alcohol use: Yes    Alcohol/week: 0.0 standard drinks    Comment: occasional  . Drug use: No     Family Hx: The patient's family history includes Cancer in his sister; Colon cancer (age of onset: 58) in his sister; Prostate cancer in his father; Stroke in his brother, mother, and sister. There is no history of Heart attack.  ROS:   Please see the history of present illness.     All other systems reviewed and are negative.   Prior CV studies:   The following studies were reviewed today:  Echo 04/08/18  EF 60-65% mild MR moderate LAE  stable mean gradient through AVR 18 mmHg peak 31 mmHg DVI 0.32   Labs/Other Tests and Data Reviewed:    EKG:  SR rate 73 RBBB/LAFB 10/29/18   Recent Labs: 11/25/2019: ALT 14; BUN 25; Creatinine 1.53; Hemoglobin 14.3; Platelet Count 149; Potassium 4.7; Sodium 138   Recent Lipid Panel Lab Results  Component Value Date/Time   CHOL 184 09/29/2018 01:42 PM   TRIG 226.0 (H) 09/29/2018 01:42 PM   HDL 41.60 09/29/2018 01:42 PM   CHOLHDL 4 09/29/2018 01:42 PM   LDLCALC 146 (H) 08/11/2017  08:43 AM   LDLDIRECT 105.0 09/29/2018 01:42 PM    Wt Readings from Last 3 Encounters:  11/25/19 201 lb 1.9 oz (91.2 kg)  10/05/19 203 lb (92.1 kg)  08/16/19 205 lb (93 kg)     Objective:    Vital Signs:  There were no vitals taken for this visit.   Affect appropriate Healthy:  appears stated age HEENT:bilateral supraclavicular adenopathy worse on left  Neck left supraclavicular adenopathy noted  JVP normal left bruits no thyromegaly Lungs clear with no wheezing and good diaphragmatic motion Heart:  S1/S2 AS  murmur, no rub, gallop or click PMI normal post sternotomy for AVR  Abdomen: benighn, BS positve, no tenderness, no AAA no bruit.  No HSM or HJR Distal pulses intact with no bruits No edema Neuro non-focal Skin warm and dry No muscular weakness   ASSESSMENT & PLAN:    Aortic insufficiency S/P Bioprosthetic AVR (aortic valve replacement) in 2011 25 mm CE. Marked increase in gradients on echo 08/27/19 with cardiac CT showeing HALT/HAM despite being on xarelto. Changed to coumadin. Plan repeat echo March 2021  Echo today improved with mean and peak gradients down by 10 mmHg each continue Coumadin target INR 2.5 -3 repeat echo in 6 months   History of DVT (deep vein thrombosis) and Recurrent pulmonary embolism Continue coumadin . This is managed by heme/onc.   Paroxysmal atrial fibrillation Maintaining NSR.  Continue  Coumadin    Essential hypertension Borderline control.   Continue to monitor.  HLD (hyperlipidemia) Has had leg pain with statins in past. Taking crestor 3 x/week   Lung nodule  [facility] VATS at [facility]  Recent CT see above f/u [facility]   Carotid Bruit:  Left duplex no stenosis f/u 08/2019   Depression:  Better off paroxetine now on low dose prozac with tofranil  Headache:  F/u primary PRN tylenol ok has schrap metal on right side of head Cant have MRI  PGF:QMKJ with primary typically Cr around 1.6 on 02/16/18    BBB:  RBBB LAD post AVR ECG q 6 months follow for more advanced AV block   CLL:  F/u with Dr Jonette Eva post chemo observation recent CT scans including neck ok but Concern for marantic process on AV Elevated WBC and left supraclavicular adenopathy Low Threshold to Rx as I believe CLL activity related to valve issues   Insomnia:  Improved off beta blocker and on zoloft   COVID-19 Education: The signs and symptoms of COVID-19 were discussed with the patient and how to seek care for testing (follow up with PCP or arrange E-visit).  The importance of social distancing was discussed today.  Time:   Today, I have spent 30 minutes with the patient with telehealth technology discussing the above problems.     Medication Adjustments/Labs and Tests Ordered: Current medicines are reviewed at length with the patient today.  Concerns regarding medicines are outlined above.   Tests Ordered:  Echo for AVR March 2021   Medication Changes:  None   Disposition:  F/u with me in 3 months when he has his echo   Signed, Jenkins Rouge, MD  12/15/2019 10:05 AM    [place]

## 2019-12-13 ENCOUNTER — Encounter: Payer: Self-pay | Admitting: Family Medicine

## 2019-12-15 ENCOUNTER — Ambulatory Visit (HOSPITAL_COMMUNITY): Payer: Medicare Other | Attending: Cardiology

## 2019-12-15 ENCOUNTER — Other Ambulatory Visit: Payer: Self-pay

## 2019-12-15 ENCOUNTER — Ambulatory Visit (INDEPENDENT_AMBULATORY_CARE_PROVIDER_SITE_OTHER): Payer: Medicare Other

## 2019-12-15 ENCOUNTER — Ambulatory Visit: Payer: Medicare Other | Admitting: Cardiovascular Disease

## 2019-12-15 VITALS — HR 71 | Ht 70.0 in

## 2019-12-15 DIAGNOSIS — Z952 Presence of prosthetic heart valve: Secondary | ICD-10-CM

## 2019-12-15 DIAGNOSIS — I2699 Other pulmonary embolism without acute cor pulmonale: Secondary | ICD-10-CM

## 2019-12-15 DIAGNOSIS — I480 Paroxysmal atrial fibrillation: Secondary | ICD-10-CM | POA: Diagnosis not present

## 2019-12-15 DIAGNOSIS — Z5181 Encounter for therapeutic drug level monitoring: Secondary | ICD-10-CM | POA: Diagnosis not present

## 2019-12-15 DIAGNOSIS — I359 Nonrheumatic aortic valve disorder, unspecified: Secondary | ICD-10-CM | POA: Insufficient documentation

## 2019-12-15 LAB — POCT INR: INR: 1.6 — AB (ref 2.0–3.0)

## 2019-12-15 NOTE — Patient Instructions (Signed)
Description   Take 2 tablets today, then start taking 1.5 tablets daily except 2 tablets on Tuesdays, Thursdays and Saturdays. Recheck INR in 2 weeks. Coumadin Clinic [phone] Main [phone]

## 2019-12-15 NOTE — Patient Instructions (Addendum)
Your physician recommends that you continue on your current medications as directed. Please refer to the Current Medication list given to you today.  Your physician has requested that you have an echocardiogram. Echocardiography is a painless test that uses sound waves to create images of your heart. It provides your doctor with information about the size and shape of your heart and how well your heart's chambers and valves are working. This procedure takes approximately one hour. There are no restrictions for this procedure. 6 MONTHS SAME DAY AS APPT Your physician wants you to follow-up in: [place] will receive a reminder letter in the mail two months in advance. If you don't receive a letter, please call our office to schedule the follow-up appointment.

## 2019-12-25 ENCOUNTER — Encounter: Payer: Self-pay | Admitting: Hematology & Oncology

## 2019-12-26 ENCOUNTER — Other Ambulatory Visit: Payer: Self-pay | Admitting: Family Medicine

## 2019-12-28 ENCOUNTER — Telehealth: Payer: Self-pay | Admitting: Hematology & Oncology

## 2019-12-28 ENCOUNTER — Other Ambulatory Visit: Payer: Self-pay

## 2019-12-28 ENCOUNTER — Inpatient Hospital Stay (HOSPITAL_BASED_OUTPATIENT_CLINIC_OR_DEPARTMENT_OTHER): Payer: Medicare Other | Admitting: Hematology & Oncology

## 2019-12-28 ENCOUNTER — Encounter: Payer: Self-pay | Admitting: Hematology & Oncology

## 2019-12-28 ENCOUNTER — Inpatient Hospital Stay: Payer: Medicare Other | Attending: Hematology & Oncology

## 2019-12-28 ENCOUNTER — Other Ambulatory Visit: Payer: Self-pay | Admitting: Family Medicine

## 2019-12-28 VITALS — BP 104/80 | HR 67 | Temp 97.0°F | Resp 20 | Wt 202.0 lb

## 2019-12-28 DIAGNOSIS — C9110 Chronic lymphocytic leukemia of B-cell type not having achieved remission: Secondary | ICD-10-CM

## 2019-12-28 DIAGNOSIS — Z79899 Other long term (current) drug therapy: Secondary | ICD-10-CM | POA: Diagnosis not present

## 2019-12-28 DIAGNOSIS — R591 Generalized enlarged lymph nodes: Secondary | ICD-10-CM | POA: Insufficient documentation

## 2019-12-28 DIAGNOSIS — Z7901 Long term (current) use of anticoagulants: Secondary | ICD-10-CM | POA: Insufficient documentation

## 2019-12-28 DIAGNOSIS — E7800 Pure hypercholesterolemia, unspecified: Secondary | ICD-10-CM

## 2019-12-28 DIAGNOSIS — I2699 Other pulmonary embolism without acute cor pulmonale: Secondary | ICD-10-CM | POA: Diagnosis not present

## 2019-12-28 LAB — CMP (CANCER CENTER ONLY)
ALT: 15 U/L (ref 0–44)
AST: 17 U/L (ref 15–41)
Albumin: 4.6 g/dL (ref 3.5–5.0)
Alkaline Phosphatase: 48 U/L (ref 38–126)
Anion gap: 6 (ref 5–15)
BUN: 19 mg/dL (ref 8–23)
CO2: 30 mmol/L (ref 22–32)
Calcium: 9.5 mg/dL (ref 8.9–10.3)
Chloride: 103 mmol/L (ref 98–111)
Creatinine: 1.49 mg/dL — ABNORMAL HIGH (ref 0.61–1.24)
GFR, Est AFR Am: 53 mL/min — ABNORMAL LOW (ref >60)
GFR, Estimated: 46 mL/min — ABNORMAL LOW (ref >60)
Glucose, Bld: 102 mg/dL — ABNORMAL HIGH (ref 70–99)
Potassium: 4.6 mmol/L (ref 3.5–5.1)
Sodium: 139 mmol/L (ref 135–145)
Total Bilirubin: 0.6 mg/dL (ref 0.3–1.2)
Total Protein: 6.4 g/dL — ABNORMAL LOW (ref 6.5–8.1)

## 2019-12-28 LAB — CBC WITH DIFFERENTIAL (CANCER CENTER ONLY)
Abs Immature Granulocytes: 0.03 10*3/uL (ref 0.00–0.07)
Basophils Absolute: 0 10*3/uL (ref 0.0–0.1)
Basophils Relative: 0 %
Eosinophils Absolute: 0.1 10*3/uL (ref 0.0–0.5)
Eosinophils Relative: 0 %
HCT: 41 % (ref 39.0–52.0)
Hemoglobin: 13.6 g/dL (ref 13.0–17.0)
Immature Granulocytes: 0 %
Lymphocytes Relative: 88 %
Lymphs Abs: 17.1 10*3/uL — ABNORMAL HIGH (ref 0.7–4.0)
MCH: 32.7 pg (ref 26.0–34.0)
MCHC: 33.2 g/dL (ref 30.0–36.0)
MCV: 98.6 fL (ref 80.0–100.0)
Monocytes Absolute: 1.2 10*3/uL — ABNORMAL HIGH (ref 0.1–1.0)
Monocytes Relative: 6 %
Neutro Abs: 1.3 10*3/uL — ABNORMAL LOW (ref 1.7–7.7)
Neutrophils Relative %: 6 %
Platelet Count: 146 10*3/uL — ABNORMAL LOW (ref 150–400)
RBC: 4.16 MIL/uL — ABNORMAL LOW (ref 4.22–5.81)
RDW: 13 % (ref 11.5–15.5)
WBC Count: 19.6 10*3/uL — ABNORMAL HIGH (ref 4.0–10.5)
nRBC: 0 % (ref 0.0–0.2)

## 2019-12-28 LAB — LACTATE DEHYDROGENASE: LDH: 316 U/L — ABNORMAL HIGH (ref 98–192)

## 2019-12-28 LAB — SAVE SMEAR(SSMR), FOR PROVIDER SLIDE REVIEW

## 2019-12-28 NOTE — Telephone Encounter (Signed)
LAST APPOINTMENT DATE: n/a  NEXT APPOINTMENT DATE:@2 /16/2021   LAST REFILL: 09/13/2019  QTY:30 with 2 rf

## 2019-12-28 NOTE — Telephone Encounter (Signed)
Tried calling patient VM was full unable to LM.  I did send a My Chart message to patient regarding appointment times for 2/17 per 2/16 sch msg

## 2019-12-28 NOTE — Progress Notes (Signed)
Hematology and Oncology Follow Up Visit  [name] [id number] [birth year] 74 y.o. 12/28/2019   Principle Diagnosis:   Right lower lobe pulmonary nodule- non-malignant  Recurrent pulmonary embolism  CLL -- progressive -- 11q-/13q-  Current Therapy:    Xarelto 20 mg by mouth daily       Rituxan/bendamustine-s/p cycle #2 - given 06/18/2017      Interim History:  [name] is back for an early visit.  He is worried about the lymph nodes on his neck becoming more prominent.  Of note, he did have his first coronavirus vaccine on January 31.  I suspect that some of the lymphadenopathy might be reactive to the vaccine.  He feels great.  He is really enjoying his new puppy.  It is a bearded collie.  He got this from [place].  It is 67 weeks old now.  He really is having a good time with him.  There is been no fever.  He has had no rashes.  He has had no cough.  He does have the mitral valve which I think needs to be replaced at some point.  He is being followed by cardiology very closely.  He is not noted any change in bowel or bladder habits.  He has had no leg swelling.  He is on Xarelto and doing well with this without any bleeding.  Overall, his performance status is ECOG 1.     Medications:  Current Outpatient Medications:  .  cholecalciferol (VITAMIN D) 1000 units tablet, Take 1,000 Units by mouth daily., Disp: , Rfl:  .  hydrochlorothiazide (HYDRODIURIL) 12.5 MG tablet, TAKE 1 TABLET BY MOUTH EVERY DAY, Disp: 90 tablet, Rfl: 1 .  LORazepam (ATIVAN) 0.5 MG tablet, TAKE 1 TABLET BY MOUTH EVERY DAY AS NEEDED FOR ANXIETY, Disp: 30 tablet, Rfl: 2 .  rosuvastatin (CRESTOR) 10 MG tablet, TAKE 1 TABLET BY MOUTH EVERY DAY, Disp: 90 tablet, Rfl: 3 .  sertraline (ZOLOFT) 100 MG tablet, Take 100 mg by mouth daily., Disp: , Rfl:  .  warfarin (COUMADIN) 5 MG tablet, Take as directed by [facility]madin Clinic (Patient taking differently: Take as directed by [facility]madin Clinic 12/28/2019 Takes  10 mg daily Tuesday, Thursday,and Saturday then takes 7.5 mg  the other days.), Disp: 150 tablet, Rfl: 0 .  acetaminophen (TYLENOL) 500 MG tablet, Take 2 tablets (1,000 mg total) by mouth every 6 (six) hours as needed., Disp: 100 tablet, Rfl: 2  Allergies:  Allergies  Allergen Reactions  . Hydrocodone-Acetaminophen Shortness Of Breath  . Oxycodone Hcl Shortness Of Breath  . Telmisartan-Hctz Shortness Of Breath and Palpitations    Dizziness, too  . Ramipril   . Aspirin Other (See Comments) and Rash    Other Reaction: confusion Can't take high dose  . Atorvastatin Other (See Comments)    myalgia  . Buspirone Hcl Other (See Comments)    headache  . Codeine Nausea Only and Other (See Comments)    Other Reaction: confusion  . Codeine Phosphate Itching and Nausea Only  . Morphine Sulfate Itching  . Paroxetine Other (See Comments)    Paresthesias: R arm, R leg, r side of face  . Rabeprazole Other (See Comments)    headache    Past Medical History, Surgical history, Social history, and Family History were reviewed and updated.  Review of Systems: Review of Systems  Constitutional: Negative.   HENT: Negative.   Eyes: Negative.   Respiratory: Negative.   Cardiovascular: Negative.   Gastrointestinal: Negative.  Genitourinary: Negative.   Musculoskeletal: Negative.   Skin: Negative.   Neurological: Negative.   Endo/Heme/Allergies: Negative.   Psychiatric/Behavioral: Negative.     Physical Exam:  weight is 202 lb (91.6 kg). His skin temperature is 97 F (36.1 C) (abnormal). His blood pressure is 104/80 and his pulse is 67. His respiration is 20 and oxygen saturation is 98%.   Physical Exam Vitals reviewed.  Constitutional:      Comments: Axillary exam does show some mild axillary adenopathy.  This is mostly in the left axilla.  I really cannot palpate any lymph nodes in the right axilla.    HENT:     Head: Normocephalic and atraumatic.  Eyes:     Pupils: Pupils are equal,  round, and reactive to light.  Neck:     Comments: On his neck exam, he does have adenopathy in the left supraclavicular region.  Lymph nodes are mobile.  They probably measure about 5-8 mm.  I cannot palpate any adenopathy in the right neck. Cardiovascular:     Rate and Rhythm: Normal rate and regular rhythm.     Heart sounds: Normal heart sounds.  Pulmonary:     Effort: Pulmonary effort is normal.     Breath sounds: Normal breath sounds.  Abdominal:     General: Bowel sounds are normal.     Palpations: Abdomen is soft.  Musculoskeletal:        General: No tenderness or deformity. Normal range of motion.     Cervical back: Normal range of motion.  Lymphadenopathy:     Cervical: No cervical adenopathy.  Skin:    General: Skin is warm and dry.     Findings: No erythema or rash.  Neurological:     Mental Status: He is alert and oriented to person, place, and time.  Psychiatric:        Behavior: Behavior normal.        Thought Content: Thought content normal.        Judgment: Judgment normal.     Lab Results  Component Value Date   WBC 19.6 (H) 12/28/2019   HGB 13.6 12/28/2019   HCT 41.0 12/28/2019   MCV 98.6 12/28/2019   PLT 146 (L) 12/28/2019     Chemistry      Component Value Date/Time   NA 139 12/28/2019 1201   NA 145 07/22/2017 0756   NA 139 06/27/2016 1410   K 4.6 12/28/2019 1201   K 4.3 07/22/2017 0756   K 5.1 06/27/2016 1410   CL 103 12/28/2019 1201   CL 105 07/22/2017 0756   CO2 30 12/28/2019 1201   CO2 30 07/22/2017 0756   CO2 28 06/27/2016 1410   BUN 19 12/28/2019 1201   BUN 17 07/22/2017 0756   BUN 20.9 06/27/2016 1410   CREATININE 1.49 (H) 12/28/2019 1201   CREATININE 1.6 (H) 07/22/2017 0756   CREATININE 1.6 (H) 06/27/2016 1410      Component Value Date/Time   CALCIUM 9.5 12/28/2019 1201   CALCIUM 9.6 07/22/2017 0756   CALCIUM 9.6 06/27/2016 1410   ALKPHOS 48 12/28/2019 1201   ALKPHOS 59 07/22/2017 0756   ALKPHOS 62 06/27/2016 1410   AST 17  12/28/2019 1201   AST 18 06/27/2016 1410   ALT 15 12/28/2019 1201   ALT 24 07/22/2017 0756   ALT 24 06/27/2016 1410   BILITOT 0.6 12/28/2019 1201   BILITOT 0.46 06/27/2016 1410      Impression and Plan: [name] is 74  year-old African-American gentleman. He has recurrent pulmonary embolism. I think he needs lifelong anticoagulation.He is doing very well on Xarelto. He enjoys being on Xarelto.  I still feel that the CLL is doing okay.  His labs all look fantastic.  His white cell count really has not gone up at all.  In fact, the white cell count is very steady.  I think if his white cell count was a lot higher, then we might have to worry about his CLL progressing and causing the lymph nodes to enlarge more.  I know he gets a second COVID vaccine next week.  I still think he will be okay to have this.  Hopefully the lymph nodes will not progress.  I think his regular appointment is in a couple months.  We will keep the appointment.       Marland KitchenVolanda Napoleon, MD 2/16/20211:51 PM

## 2019-12-29 ENCOUNTER — Telehealth: Payer: Self-pay | Admitting: Hematology & Oncology

## 2019-12-29 ENCOUNTER — Encounter: Payer: Self-pay | Admitting: Family Medicine

## 2019-12-29 ENCOUNTER — Other Ambulatory Visit: Payer: Self-pay

## 2019-12-29 ENCOUNTER — Ambulatory Visit: Payer: Medicare Other

## 2019-12-29 DIAGNOSIS — I480 Paroxysmal atrial fibrillation: Secondary | ICD-10-CM

## 2019-12-29 DIAGNOSIS — I2699 Other pulmonary embolism without acute cor pulmonale: Secondary | ICD-10-CM | POA: Diagnosis not present

## 2019-12-29 DIAGNOSIS — Z952 Presence of prosthetic heart valve: Secondary | ICD-10-CM

## 2019-12-29 DIAGNOSIS — Z5181 Encounter for therapeutic drug level monitoring: Secondary | ICD-10-CM | POA: Diagnosis not present

## 2019-12-29 LAB — POCT INR: INR: 1.8 — AB (ref 2.0–3.0)

## 2019-12-29 NOTE — Progress Notes (Signed)
I am not 100% sure which med is being requested- I sent one in under refill request though

## 2019-12-29 NOTE — Patient Instructions (Signed)
Description   Take 2 tablets today, then start taking 1.5 tablets daily except 2 tablets on Tuesdays, Thursdays, Saturdays and Sundays. Recheck INR in 2 weeks. [facility] [phone] Main [phone]

## 2019-12-29 NOTE — Telephone Encounter (Signed)
No los 2/16 °

## 2019-12-30 ENCOUNTER — Ambulatory Visit (INDEPENDENT_AMBULATORY_CARE_PROVIDER_SITE_OTHER): Payer: Medicare Other | Admitting: Psychology

## 2019-12-30 DIAGNOSIS — F4321 Adjustment disorder with depressed mood: Secondary | ICD-10-CM | POA: Diagnosis not present

## 2019-12-30 NOTE — Telephone Encounter (Signed)
Called [facility]  Rx was picked up yesterday.

## 2019-12-30 NOTE — Progress Notes (Signed)
Rx was sent and pt picked up yesterday

## 2020-01-01 ENCOUNTER — Other Ambulatory Visit: Payer: Self-pay | Admitting: Cardiovascular Disease

## 2020-01-06 ENCOUNTER — Encounter: Payer: Self-pay | Admitting: Family Medicine

## 2020-01-06 NOTE — Telephone Encounter (Signed)
Forwarding to Dr. Jerline Pain to advise as Dr. Yong Channel is out of the office until Monday.

## 2020-01-12 ENCOUNTER — Ambulatory Visit: Payer: Medicare Other | Admitting: *Deleted

## 2020-01-12 ENCOUNTER — Other Ambulatory Visit: Payer: Self-pay

## 2020-01-12 DIAGNOSIS — Z952 Presence of prosthetic heart valve: Secondary | ICD-10-CM | POA: Diagnosis not present

## 2020-01-12 DIAGNOSIS — I2699 Other pulmonary embolism without acute cor pulmonale: Secondary | ICD-10-CM | POA: Diagnosis not present

## 2020-01-12 DIAGNOSIS — Z5181 Encounter for therapeutic drug level monitoring: Secondary | ICD-10-CM

## 2020-01-12 DIAGNOSIS — I480 Paroxysmal atrial fibrillation: Secondary | ICD-10-CM

## 2020-01-12 LAB — POCT INR: INR: 1.6 — AB (ref 2.0–3.0)

## 2020-01-12 NOTE — Patient Instructions (Addendum)
Description   Start taking 2 tablets daily except 1.5 tablets on Mondays. Recheck INR in 2 weeks. [facility] [phone] Main [phone]

## 2020-01-21 ENCOUNTER — Encounter: Payer: Self-pay | Admitting: Hematology & Oncology

## 2020-01-26 ENCOUNTER — Ambulatory Visit: Payer: Medicare Other | Admitting: Pharmacist

## 2020-01-26 ENCOUNTER — Encounter: Payer: Self-pay | Admitting: Hematology & Oncology

## 2020-01-26 ENCOUNTER — Other Ambulatory Visit: Payer: Self-pay

## 2020-01-26 DIAGNOSIS — Z952 Presence of prosthetic heart valve: Secondary | ICD-10-CM | POA: Diagnosis not present

## 2020-01-26 DIAGNOSIS — I480 Paroxysmal atrial fibrillation: Secondary | ICD-10-CM

## 2020-01-26 DIAGNOSIS — I2699 Other pulmonary embolism without acute cor pulmonale: Secondary | ICD-10-CM

## 2020-01-26 DIAGNOSIS — Z5181 Encounter for therapeutic drug level monitoring: Secondary | ICD-10-CM

## 2020-01-26 LAB — POCT INR: INR: 1.8 — AB (ref 2.0–3.0)

## 2020-01-26 NOTE — Patient Instructions (Signed)
Take 3 tablets today and tomorrow then start taking 2 tablets daily except 2.5 tablets on Mondays. Recheck INR in 2 weeks. [facility] [phone] Main [phone]

## 2020-01-27 ENCOUNTER — Ambulatory Visit (INDEPENDENT_AMBULATORY_CARE_PROVIDER_SITE_OTHER): Payer: Medicare Other | Admitting: Psychology

## 2020-01-27 DIAGNOSIS — F4321 Adjustment disorder with depressed mood: Secondary | ICD-10-CM

## 2020-02-02 ENCOUNTER — Other Ambulatory Visit: Payer: Self-pay | Admitting: Family Medicine

## 2020-02-02 NOTE — Telephone Encounter (Signed)
May refill but get him set up for physical 1 year from last physical

## 2020-02-02 NOTE — Telephone Encounter (Signed)
Patient requesting refill on Meclizine. Patient was last seen in office on 05-04-2019. No pending appointment. Please advise

## 2020-02-03 ENCOUNTER — Telehealth: Payer: Self-pay

## 2020-02-03 NOTE — Telephone Encounter (Signed)
Please call patient to make CPE app

## 2020-02-03 NOTE — Telephone Encounter (Signed)
Called pt and LVM to schedule appt

## 2020-02-09 ENCOUNTER — Other Ambulatory Visit: Payer: Self-pay

## 2020-02-09 ENCOUNTER — Ambulatory Visit: Payer: Medicare Other | Admitting: Pharmacist

## 2020-02-09 DIAGNOSIS — Z5181 Encounter for therapeutic drug level monitoring: Secondary | ICD-10-CM | POA: Diagnosis not present

## 2020-02-09 DIAGNOSIS — Z952 Presence of prosthetic heart valve: Secondary | ICD-10-CM

## 2020-02-09 DIAGNOSIS — I2699 Other pulmonary embolism without acute cor pulmonale: Secondary | ICD-10-CM

## 2020-02-09 DIAGNOSIS — I480 Paroxysmal atrial fibrillation: Secondary | ICD-10-CM | POA: Diagnosis not present

## 2020-02-09 LAB — POCT INR: INR: 2.6 (ref 2.0–3.0)

## 2020-02-09 NOTE — Patient Instructions (Addendum)
Continue taking 2 tablets daily except 2.5 tablets on Mondays. Recheck INR in 3 weeks. [facility]in Clinic [phone] Main [phone]

## 2020-02-13 ENCOUNTER — Encounter: Payer: Self-pay | Admitting: Hematology & Oncology

## 2020-02-24 ENCOUNTER — Ambulatory Visit (INDEPENDENT_AMBULATORY_CARE_PROVIDER_SITE_OTHER): Payer: Medicare Other | Admitting: Psychology

## 2020-02-24 DIAGNOSIS — F4321 Adjustment disorder with depressed mood: Secondary | ICD-10-CM | POA: Diagnosis not present

## 2020-03-02 ENCOUNTER — Encounter: Payer: Self-pay | Admitting: Family Medicine

## 2020-03-03 ENCOUNTER — Ambulatory Visit: Payer: Medicare Other | Admitting: *Deleted

## 2020-03-03 ENCOUNTER — Other Ambulatory Visit: Payer: Self-pay

## 2020-03-03 ENCOUNTER — Encounter: Payer: Self-pay | Admitting: Hematology & Oncology

## 2020-03-03 ENCOUNTER — Telehealth: Payer: Self-pay | Admitting: Hematology & Oncology

## 2020-03-03 DIAGNOSIS — I2699 Other pulmonary embolism without acute cor pulmonale: Secondary | ICD-10-CM

## 2020-03-03 DIAGNOSIS — I480 Paroxysmal atrial fibrillation: Secondary | ICD-10-CM

## 2020-03-03 DIAGNOSIS — Z952 Presence of prosthetic heart valve: Secondary | ICD-10-CM | POA: Diagnosis not present

## 2020-03-03 DIAGNOSIS — Z5181 Encounter for therapeutic drug level monitoring: Secondary | ICD-10-CM | POA: Diagnosis not present

## 2020-03-03 LAB — POCT INR: INR: 3.4 — AB (ref 2.0–3.0)

## 2020-03-03 NOTE — Progress Notes (Signed)
Pt also has appointment scheduled to see [name] on 4/28. Pt stated that he needs INR  completed 72 hours before procedure. Pt stated he thought dental procedure was scheduled for 4/28. Gave pt the number to the coumadin clinic and asked him to give [name] a call if we need to reschedule INR appointment.

## 2020-03-03 NOTE — Telephone Encounter (Signed)
Turned call to patient regarding appointments being rescheduled as requested by patient per 4/23 sch msg

## 2020-03-03 NOTE — Patient Instructions (Addendum)
Description   Take 1/2 a tablet today and then continue taking 2 tablets daily except 2.5 tablets on Mondays.  Inr recheck on 4/27 for dental procedure (usually 3 weeks). [facility]in Clinic [phone] Main [phone]

## 2020-03-07 ENCOUNTER — Ambulatory Visit: Payer: Medicare Other | Admitting: *Deleted

## 2020-03-07 ENCOUNTER — Other Ambulatory Visit: Payer: Self-pay

## 2020-03-07 ENCOUNTER — Other Ambulatory Visit: Payer: Self-pay | Admitting: *Deleted

## 2020-03-07 DIAGNOSIS — Z5181 Encounter for therapeutic drug level monitoring: Secondary | ICD-10-CM

## 2020-03-07 DIAGNOSIS — I2699 Other pulmonary embolism without acute cor pulmonale: Secondary | ICD-10-CM

## 2020-03-07 DIAGNOSIS — Z952 Presence of prosthetic heart valve: Secondary | ICD-10-CM | POA: Diagnosis not present

## 2020-03-07 DIAGNOSIS — C9110 Chronic lymphocytic leukemia of B-cell type not having achieved remission: Secondary | ICD-10-CM

## 2020-03-07 DIAGNOSIS — I480 Paroxysmal atrial fibrillation: Secondary | ICD-10-CM

## 2020-03-07 LAB — POCT INR: INR: 2.3 (ref 2.0–3.0)

## 2020-03-07 NOTE — Patient Instructions (Signed)
Description   Take take 2.5 tablets today  and then continue taking 2 tablets daily except 2.5 tablets on Mondays.  Recheck INR in 3 weeks. [facility]in Clinic [phone] Main [phone]

## 2020-03-07 NOTE — Telephone Encounter (Signed)
Called patient and left several messages. Got in touch with patient today. Patient complaining of chest pain here and there, and feeling winded with activity, which is not usual for him. Patient stated he is under a lot of stress, and this might be all stress related. Encouraged patient to come in to see a PA. Patient refused an appointment for tomorrow, due to other appointments. First available is on Friday that works with patient's schedule. Patient verbalized understanding.

## 2020-03-08 ENCOUNTER — Inpatient Hospital Stay: Payer: Medicare Other | Attending: Hematology & Oncology

## 2020-03-08 ENCOUNTER — Inpatient Hospital Stay (HOSPITAL_BASED_OUTPATIENT_CLINIC_OR_DEPARTMENT_OTHER): Payer: Medicare Other | Admitting: Hematology & Oncology

## 2020-03-08 ENCOUNTER — Encounter: Payer: Self-pay | Admitting: Hematology & Oncology

## 2020-03-08 ENCOUNTER — Telehealth: Payer: Self-pay | Admitting: Pharmacy Technician

## 2020-03-08 VITALS — BP 142/79 | HR 63 | Temp 97.1°F | Resp 18 | Wt 202.0 lb

## 2020-03-08 DIAGNOSIS — Z7901 Long term (current) use of anticoagulants: Secondary | ICD-10-CM | POA: Diagnosis not present

## 2020-03-08 DIAGNOSIS — C9110 Chronic lymphocytic leukemia of B-cell type not having achieved remission: Secondary | ICD-10-CM | POA: Insufficient documentation

## 2020-03-08 DIAGNOSIS — I2699 Other pulmonary embolism without acute cor pulmonale: Secondary | ICD-10-CM | POA: Diagnosis not present

## 2020-03-08 DIAGNOSIS — R911 Solitary pulmonary nodule: Secondary | ICD-10-CM | POA: Insufficient documentation

## 2020-03-08 LAB — CBC WITH DIFFERENTIAL (CANCER CENTER ONLY)
Abs Immature Granulocytes: 0.04 10*3/uL (ref 0.00–0.07)
Basophils Absolute: 0 10*3/uL (ref 0.0–0.1)
Basophils Relative: 0 %
Eosinophils Absolute: 0.1 10*3/uL (ref 0.0–0.5)
Eosinophils Relative: 0 %
HCT: 38.3 % — ABNORMAL LOW (ref 39.0–52.0)
Hemoglobin: 12.5 g/dL — ABNORMAL LOW (ref 13.0–17.0)
Immature Granulocytes: 0 %
Lymphocytes Relative: 88 %
Lymphs Abs: 23.4 10*3/uL — ABNORMAL HIGH (ref 0.7–4.0)
MCH: 32.5 pg (ref 26.0–34.0)
MCHC: 32.6 g/dL (ref 30.0–36.0)
MCV: 99.5 fL (ref 80.0–100.0)
Monocytes Absolute: 1.9 10*3/uL — ABNORMAL HIGH (ref 0.1–1.0)
Monocytes Relative: 7 %
Neutro Abs: 1.4 10*3/uL — ABNORMAL LOW (ref 1.7–7.7)
Neutrophils Relative %: 5 %
Platelet Count: 119 10*3/uL — ABNORMAL LOW (ref 150–400)
RBC: 3.85 MIL/uL — ABNORMAL LOW (ref 4.22–5.81)
RDW: 13 % (ref 11.5–15.5)
WBC Count: 26.7 10*3/uL — ABNORMAL HIGH (ref 4.0–10.5)
nRBC: 0 % (ref 0.0–0.2)

## 2020-03-08 LAB — CMP (CANCER CENTER ONLY)
ALT: 14 U/L (ref 0–44)
AST: 17 U/L (ref 15–41)
Albumin: 4.3 g/dL (ref 3.5–5.0)
Alkaline Phosphatase: 48 U/L (ref 38–126)
Anion gap: 7 (ref 5–15)
BUN: 25 mg/dL — ABNORMAL HIGH (ref 8–23)
CO2: 29 mmol/L (ref 22–32)
Calcium: 9.5 mg/dL (ref 8.9–10.3)
Chloride: 105 mmol/L (ref 98–111)
Creatinine: 1.56 mg/dL — ABNORMAL HIGH (ref 0.61–1.24)
GFR, Est AFR Am: 50 mL/min — ABNORMAL LOW (ref >60)
GFR, Estimated: 43 mL/min — ABNORMAL LOW (ref >60)
Glucose, Bld: 108 mg/dL — ABNORMAL HIGH (ref 70–99)
Potassium: 4.4 mmol/L (ref 3.5–5.1)
Sodium: 141 mmol/L (ref 135–145)
Total Bilirubin: 0.4 mg/dL (ref 0.3–1.2)
Total Protein: 6.3 g/dL — ABNORMAL LOW (ref 6.5–8.1)

## 2020-03-08 MED ORDER — ACALABRUTINIB 100 MG PO CAPS: 100.0000 mg | ORAL_CAPSULE | Freq: Two times a day (BID) | ORAL | 6 refills | Status: DC

## 2020-03-08 NOTE — Telephone Encounter (Signed)
Oral Oncology Patient Advocate Encounter   Received notification from [facility] that prior authorization for [place] is required.   PA submitted on CoverMyMeds Key [id number] Status is pending   Oral Oncology Clinic will continue to follow.  [place] Patient [place] Phone [phone] Fax [phone] 03/09/2020 9:26 AM

## 2020-03-08 NOTE — Progress Notes (Signed)
Hematology and Oncology Follow Up Visit  Mcguire Dresel Daffin OJ:[id number] 1945-12-27 74 y.o. 03/08/2020   Principle Diagnosis:   Right lower lobe pulmonary nodule- non-malignant  Recurrent pulmonary embolism  CLL -- progressive -- 11q-/13q-  Current Therapy:    Xarelto 20 mg by mouth daily  Calquence 100 mg po BID -- start on 03/13/2020  Venetoclax 400 mg po q day -- start in July 2021       Rituxan/bendamustine-s/p cycle #2 - given 06/18/2017      Interim History:  Mr.  Goldfine is back for follow-up.  Overall, I think we are now seen that his CLL is becoming more active.  His white cell count is up.  He has more adenopathy in the neck and in the axilla.  He has tried to hold off as long as possible with respect to getting treatment.  I think that we really have to get started on treatment.  However, I would like to get a baseline CT scan of his neck/chest/abdomen/pelvis so we can see where things stand as a baseline.  He does have an adverse genetic abnormality.  He does have the 11q-chromosome abnormality on FISH.  I think that he would do well with the acalabrutinib/venetoclax protocol.  This clearly has shown that this is quite active.  Denies think about this is a patient is all had to take treatment for a year.  He is doing well with his new dog.  The dog is a bearded collie.  He is now I think about 67 months old.  Mr. Palmese is on Xarelto.  He has had no bleeding with the Xarelto.  He has had no problems with nausea or vomiting.  He has had no abdominal pain.  He has had no change in bowel or bladder habits.  Overall, I would say his performance status is ECOG 0.  Medications:  Current Outpatient Medications:  .  cholecalciferol (VITAMIN D) 1000 units tablet, Take 1,000 Units by mouth daily., Disp: , Rfl:  .  hydrochlorothiazide (HYDRODIURIL) 12.5 MG tablet, TAKE 1 TABLET BY MOUTH EVERY DAY, Disp: 90 tablet, Rfl: 1 .  LORazepam (ATIVAN) 0.5 MG tablet, TAKE 1 TABLET BY  MOUTH EVERY DAY AS NEEDED FOR ANXIETY, Disp: 30 tablet, Rfl: 2 .  meclizine (ANTIVERT) 12.5 MG tablet, TAKE 1 TABLET EVERY DAY AS NEEDED FOR DIZZINESS, Disp: 30 tablet, Rfl: 2 .  rosuvastatin (CRESTOR) 10 MG tablet, TAKE 1 TABLET BY MOUTH EVERY DAY, Disp: 90 tablet, Rfl: 3 .  sertraline (ZOLOFT) 100 MG tablet, Take 100 mg by mouth daily., Disp: , Rfl:  .  warfarin (COUMADIN) 5 MG tablet, Take 2 tablets daily except 1.5 tablets on Monday, Wednesday, Friday or as directed by Coumadin Clinic., Disp: 170 tablet, Rfl: 0  Allergies:  Allergies  Allergen Reactions  . Hydrocodone-Acetaminophen Shortness Of Breath  . Oxycodone Hcl Shortness Of Breath  . Telmisartan-Hctz Shortness Of Breath and Palpitations    Dizziness, too  . Ramipril   . Aspirin Other (See Comments) and Rash    Other Reaction: confusion Can't take high dose  . Atorvastatin Other (See Comments)    myalgia  . Buspirone Hcl Other (See Comments)    headache  . Codeine Nausea Only and Other (See Comments)    Other Reaction: confusion  . Codeine Phosphate Itching and Nausea Only  . Morphine Sulfate Itching  . Paroxetine Other (See Comments)    Paresthesias: R arm, R leg, r side of face  . Rabeprazole Other (  See Comments)    headache    Past Medical History, Surgical history, Social history, and Family History were reviewed and updated.  Review of Systems: Review of Systems  Constitutional: Negative.   HENT: Negative.   Eyes: Negative.   Respiratory: Negative.   Cardiovascular: Negative.   Gastrointestinal: Negative.   Genitourinary: Negative.   Musculoskeletal: Negative.   Skin: Negative.   Neurological: Negative.   Endo/Heme/Allergies: Negative.   Psychiatric/Behavioral: Negative.     Physical Exam:  weight is 202 lb (91.6 kg). His temporal temperature is 97.1 F (36.2 C) (abnormal). His blood pressure is 142/79 (abnormal) and his pulse is 63. His respiration is 18 and oxygen saturation is 98%.   Physical  Exam Vitals reviewed.  Constitutional:      Comments: Axillary exam does show increase in adenopathy bilaterally.  He has both left and right axillary adenopathy.  He probably has more bulk in the left axilla.  Marland Kitchen    HENT:     Head: Normocephalic and atraumatic.  Eyes:     Pupils: Pupils are equal, round, and reactive to light.  Neck:     Comments: On his neck exam, he does have adenopathy in the left supraclavicular region.  He has a lymph node probably measures about 2 cm.  Lymph nodes are mobile.  Other lymph nodes in the posterior cervical left chain by measure about 1 cm.  He does have some palpable lymph nodes on the right neck measuring about 5-10 mm.   Cardiovascular:     Rate and Rhythm: Normal rate and regular rhythm.     Heart sounds: Normal heart sounds.  Pulmonary:     Effort: Pulmonary effort is normal.     Breath sounds: Normal breath sounds.  Abdominal:     General: Bowel sounds are normal.     Palpations: Abdomen is soft.  Musculoskeletal:        General: No tenderness or deformity. Normal range of motion.     Cervical back: Normal range of motion.  Lymphadenopathy:     Cervical: No cervical adenopathy.  Skin:    General: Skin is warm and dry.     Findings: No erythema or rash.  Neurological:     Mental Status: He is alert and oriented to person, place, and time.  Psychiatric:        Behavior: Behavior normal.        Thought Content: Thought content normal.        Judgment: Judgment normal.     Lab Results  Component Value Date   WBC 26.7 (H) 03/08/2020   HGB 12.5 (L) 03/08/2020   HCT 38.3 (L) 03/08/2020   MCV 99.5 03/08/2020   PLT 119 (L) 03/08/2020     Chemistry      Component Value Date/Time   NA 141 03/08/2020 1151   NA 145 07/22/2017 0756   NA 139 06/27/2016 1410   K 4.4 03/08/2020 1151   K 4.3 07/22/2017 0756   K 5.1 06/27/2016 1410   CL 105 03/08/2020 1151   CL 105 07/22/2017 0756   CO2 29 03/08/2020 1151   CO2 30 07/22/2017 0756   CO2  28 06/27/2016 1410   BUN 25 (H) 03/08/2020 1151   BUN 17 07/22/2017 0756   BUN 20.9 06/27/2016 1410   CREATININE 1.56 (H) 03/08/2020 1151   CREATININE 1.6 (H) 07/22/2017 0756   CREATININE 1.6 (H) 06/27/2016 1410      Component Value Date/Time   CALCIUM  9.5 03/08/2020 1151   CALCIUM 9.6 07/22/2017 0756   CALCIUM 9.6 06/27/2016 1410   ALKPHOS 48 03/08/2020 1151   ALKPHOS 59 07/22/2017 0756   ALKPHOS 62 06/27/2016 1410   AST 17 03/08/2020 1151   AST 18 06/27/2016 1410   ALT 14 03/08/2020 1151   ALT 24 07/22/2017 0756   ALT 24 06/27/2016 1410   BILITOT 0.4 03/08/2020 1151   BILITOT 0.46 06/27/2016 1410      Impression and Plan: Mr. Rissmiller is 74 year-old African-American gentleman. He has recurrent pulmonary embolism. I think he needs lifelong anticoagulation.He is doing very well on Xarelto. He enjoys being on Xarelto.  I feel that we now have to embark upon therapy.  I believe that the acalabrutinib/venetoclax combination would be tolerable.  I think would be effective.  We will start off with the acalabrutinib.  We will dose him at 100 mg p.o. twice daily.  I told him that it is common to see the white cell count increased for the first couple months and then the white cell count starts to normalize.  I would not start the venetoclax probably until July depending on how his white cell count response.  We should be able to see his lymphadenopathy improve over the next several weeks.  I gave him some information about each oral medication.  Hopefully, this will not be expensive for him.  I spent about 45 minutes with him today.  I had to go over the treatment protocol.  I went over side effects.  He understands all this.  He is very good about getting back with this to let us know what is going on.  We will see about getting the CT scans done next week.  I will plan to see him back in probably a month or so.  By then, he should be well into the acalabrutinib portion of the  protocol.         Marland KitchenVolanda Napoleon, MD 4/28/202112:49 PM

## 2020-03-09 ENCOUNTER — Telehealth: Payer: Self-pay | Admitting: Hematology & Oncology

## 2020-03-09 ENCOUNTER — Telehealth: Payer: Self-pay | Admitting: Pharmacist

## 2020-03-09 NOTE — Telephone Encounter (Signed)
Appointments scheduled calendar printed & mailed per 4/28 los

## 2020-03-09 NOTE — Telephone Encounter (Signed)
Oral Oncology Patient Advocate Encounter  Prior Authorization for Calquence has been approved.    PA# [id number] Effective dates: 03/09/20 through 11/10/20  Patients co-pay is [id number]  Oral Oncology Clinic will continue to follow.   [facility]rett Patient [place] Phone [phone] Fax [phone] 03/09/2020 9:31 AM

## 2020-03-10 ENCOUNTER — Ambulatory Visit: Payer: Medicare Other | Admitting: Physician Assistant

## 2020-03-10 ENCOUNTER — Encounter: Payer: Self-pay | Admitting: Physician Assistant

## 2020-03-10 ENCOUNTER — Other Ambulatory Visit: Payer: Self-pay

## 2020-03-10 VITALS — BP 140/90 | Ht 70.0 in | Wt 200.6 lb

## 2020-03-10 DIAGNOSIS — I480 Paroxysmal atrial fibrillation: Secondary | ICD-10-CM | POA: Diagnosis not present

## 2020-03-10 DIAGNOSIS — F411 Generalized anxiety disorder: Secondary | ICD-10-CM

## 2020-03-10 DIAGNOSIS — R0602 Shortness of breath: Secondary | ICD-10-CM

## 2020-03-10 DIAGNOSIS — Z5181 Encounter for therapeutic drug level monitoring: Secondary | ICD-10-CM | POA: Diagnosis not present

## 2020-03-10 DIAGNOSIS — I10 Essential (primary) hypertension: Secondary | ICD-10-CM

## 2020-03-10 DIAGNOSIS — R0789 Other chest pain: Secondary | ICD-10-CM

## 2020-03-10 DIAGNOSIS — Z952 Presence of prosthetic heart valve: Secondary | ICD-10-CM

## 2020-03-10 NOTE — Progress Notes (Signed)
Cardiology Office Note    Date:  03/10/2020   ID:  [name], DOB 1946-05-11, MRN [id number]  PCP:  [name], MD  Cardiologist:  Dr. [name]   Chief Complaint: CP and SOB  History of Present Illness:   [name] is a 74 y.o. male with hx of  bioprosthetic AVR in 4098, PAF, LUE cephalic/basilic thrombus, CKD,  HTN,  HLD, recurrent pulmonary embolism on lifelong anticoagulation (now on coumadin, previously Xarelto), CLL, radical prostatectomy for cancer In 2016, RLL wedge resection for granulomatous benign nodule, PTSD seen for CP and SOB.   Has had sleep issues with nightmares and we have since stopped his beta blocker including lopressor and atenolol.   Marked increase in gradients on echo 08/27/19 with cardiac CT showing HALT/HAM despite being on xarelto. Changed to coumadin. Repeat echo 12/2018 showed improved  mean and peak gradients (Peak velocity 3.7 m/sec mean gradient  39->30 mmHg peak 65-> 55 mmHg AVA 0.79 cm2). Plan to repeat echo in 6 months.   Seen by [name] 03/08/20. Having adenopathy. Started on Calquence and plan for repeat scan.   Added to my schedule for CP and SOB. On and off for past one month. Only last for few seconds.  Sometimes he has sensation of dull achy chest discomfort and then becomes anxious in relation to shortness of breath.  He goes for hiking frequently and walks every day without reproducible symptoms.  No orthopnea, PND, syncope, lower extremity edema or melena.   Past Medical History:  Diagnosis Date  . Anticoagulants causing adverse effect in therapeutic use   . Anxiety    Paxil seemed to cause odd neuro side effects; better on prozac as of 09/2015  . Aortic regurgitation 04/2007 surgery   Resolved with [facility]R at [facility]  . Cervical spondylosis    ESI by [name] in Ortho. 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 Chronic lymphocytic leukemia (CLL), B-cell ([place]) approx 2012   stable on f/u's with [name] (most recent 07/2017)   . History of prostate cancer 2003   Rad prost  . History of pulmonary embolism 2011; 10/2014   Recurrence off of anticoagulation 10/2014: per hem/onc ([name]) pt needs lifelong anticoagulation (hypercoag w/u neg).  . Hyperlipidemia    statin-intolerant, except for crestor low dose.  Marland Kitchen Hypertension   . Migraine syndrome   . Mitral regurgitation   . PAF (paroxysmal atrial fibrillation) ([place])   . Panic attacks    "           "             "                  "                  "                       "                             "                         . Pulmonary nodule, right 2015   RLL: resected at [facility] --VATS; Benign RLL nodule (fungal and AFB stains neg).  Plan is [facility] thoracic surg to do f/u o/v & CT chest  1 yr   . Right-sided headache 07/2016   Primary stabbing HA or migraine per neuro (Dr. Tomi Likens).  Gabapentin and topamax not tolerated.  These resolved spontaneously.    Past Surgical History:  Procedure Laterality Date  . AORTIC VALVE REPLACEMENT  2011   [facility]  (bioprosthetic)  . CARDIAC CATHETERIZATION  04/2010   Normal coronaries.  . Carotid dopplers  08/2015   NORMAL  . CATARACT EXTRACTION     L 12/06/09   R 09/14/09  . COLONOSCOPY  01/26/13   tic's, o/w normal.  ([place])--recall 10 yrs.  Marland Kitchen PENILE PROSTHESIS IMPLANT    . PROSTATECTOMY  04/2002   for prostate cancer  . Pulmonary nodule resection  Fall/winter 2016   [facility]  . SHOULDER SURGERY     rotator cuff on both arms   . TIBIALIS TENDON TRANSFER / REPAIR Right 03/02/2019   Procedure: RECONSTRUCTION OF RIGHT  ANTERIOR TIBIALIS TENDON;  Surgeon: Erle Crocker, MD;  Location: [place];  Service: Orthopedics;  Laterality: Right;  . TRANSTHORACIC ECHOCARDIOGRAM  09/20/2014; 03/2016; 03/2017   2015-mild LVH, EF 50-55%, wall motion nl, grade I diast dysfxn, prosth aort valve good,transaortic gradients decreased compared to echo 11/2013.   03/2016: EF 55-60%, normal LV function, severe LVH, normal  diast fxn, mild increase in AV gradient compared to 09/2014 echo.  2018: EF 55-60%, normal LV fxn, grd II DD, AV stable/gradient's stable.    Current Medications: Prior to Admission medications   Medication Sig Start Date End Date Taking? Authorizing Provider  acalabrutinib (CALQUENCE) 100 MG capsule Take 1 capsule (100 mg total) by mouth 2 (two) times daily. 03/08/20   Volanda Napoleon, MD  cholecalciferol (VITAMIN D) 1000 units tablet Take 1,000 Units by mouth daily.    [provider]  hydrochlorothiazide (HYDRODIURIL) 12.5 MG tablet TAKE 1 TABLET BY MOUTH EVERY DAY 10/22/19   [name], MD  LORazepam (ATIVAN) 0.5 MG tablet TAKE 1 TABLET BY MOUTH EVERY DAY AS NEEDED FOR ANXIETY 12/29/19   [name], MD  meclizine (ANTIVERT) 12.5 MG tablet TAKE 1 TABLET EVERY DAY AS NEEDED FOR DIZZINESS 02/03/20   [name], MD  rosuvastatin (CRESTOR) 10 MG tablet TAKE 1 TABLET BY MOUTH EVERY DAY 12/28/19   [name], MD  sertraline (ZOLOFT) 100 MG tablet Take 100 mg by mouth daily.    [provider]  warfarin (COUMADIN) 5 MG tablet Take 2 tablets daily except 1.5 tablets on Monday, Wednesday, Friday or as directed by Coumadin Clinic. 01/03/20   Josue Hector, MD    Allergies:   Hydrocodone-acetaminophen, Oxycodone hcl, Telmisartan-hctz, Ramipril, Aspirin, Atorvastatin, Buspirone hcl, Codeine, Codeine phosphate, Morphine sulfate, Paroxetine, and Rabeprazole   Social History   Socioeconomic History  . Marital status: Single    Spouse name: Not on file  . Number of children: Not on file  . Years of education: Not on file  . Highest education level: Not on file  Occupational History    Comment: Retired - Tobacco   Tobacco Use  . Smoking status: Former Smoker    Packs/day: 1.00    Years: 12.00    Pack years: 12.00    Types: Cigarettes    Start date: 11/28/1968    Quit date: 01/29/1981    Years since quitting: 39.1  . Smokeless tobacco: Never Used  .  Tobacco comment: quit 74 yo   Substance and Sexual Activity  . Alcohol use: Yes    Alcohol/week: 0.0  standard drinks    Comment: occasional  . Drug use: No  . Sexual activity: Not on file  Other Topics Concern  . Not on file  Social History Narrative   Single. No children. Lives alone      Retired from [employer].       Hobbies: travel, Scientist, product/process development, enjoys being outdoors including hiking   Social Determinants of Radio broadcast assistant Strain:   . Difficulty of Paying Living Expenses:   Food Insecurity:   . Worried About Charity fundraiser in the Last Year:   . Arboriculturist in the Last Year:   Transportation Needs:   . Film/video editor (Medical):   Marland Kitchen Lack of Transportation (Non-Medical):   Physical Activity:   . Days of Exercise per Week:   . Minutes of Exercise per Session:   Stress:   . Feeling of Stress :   Social Connections:   . Frequency of Communication with Friends and Family:   . Frequency of Social Gatherings with Friends and Family:   . Attends Religious Services:   . Active Member of Clubs or Organizations:   . Attends Archivist Meetings:   Marland Kitchen Marital Status:      Family History:  The patient's family history includes Cancer in his sister; Colon cancer (age of onset: 65) in his sister; Prostate cancer in his father; Stroke in his brother, mother, and sister.   ROS:   Please see the history of present illness.    ROS All other systems reviewed and are negative.   PHYSICAL EXAM:   VS:  BP 140/90   Ht '5\' 10"'$  (1.778 m)   Wt 200 lb 9.6 oz (91 kg)   SpO2 96%   BMI 28.78 kg/m    GEN: Well nourished, well developed, in no acute distress  HEENT: normal  Neck: no JVD, carotid bruits, or masses Cardiac: RRR; no murmurs, rubs, or gallops,no edema  Respiratory:  clear to auscultation bilaterally, normal work of breathing GI: soft, nontender, nondistended, + BS MS: no deformity or atrophy  Skin: warm and  dry, no rash Neuro:  Alert and Oriented x 3, Strength and sensation are intact Psych: euthymic mood, full affect  Wt Readings from Last 3 Encounters:  03/10/20 200 lb 9.6 oz (91 kg)  03/08/20 202 lb (91.6 kg)  12/28/19 202 lb (91.6 kg)      Studies/Labs Reviewed:   EKG:  EKG is ordered today.  The ekg ordered today demonstrates normal sinus rhythm chronic right bundle branch block  Recent Labs: 03/08/2020: ALT 14; BUN 25; Creatinine 1.56; Hemoglobin 12.5; Platelet Count 119; Potassium 4.4; Sodium 141   Lipid Panel    Component Value Date/Time   CHOL 184 09/29/2018 1342   TRIG 226.0 (H) 09/29/2018 1342   HDL 41.60 09/29/2018 1342   CHOLHDL 4 09/29/2018 1342   VLDL 45.2 (H) 09/29/2018 1342   LDLCALC 146 (H) 08/11/2017 0843   LDLDIRECT 105.0 09/29/2018 1342    Additional studies/ records that were reviewed today include:   Echocardiogram: 12/15/19 1. Left ventricular ejection fraction, by visual estimation, is 60 to  65%. The left ventricle has normal function. There is mildly increased  left ventricular hypertrophy.  2. Left ventricular diastolic parameters are indeterminate.  3. The left ventricle has no regional wall motion abnormalities.  4. Global right ventricle has normal systolic function.The right  ventricular size is normal. No increase in right ventricular wall  thickness.  5. Left atrial size was mild-moderately dilated.  6. Right atrial size was normal.  7. Mild mitral annular calcification.  8. The mitral valve is grossly normal. Mild mitral valve regurgitation.  No evidence of mitral stenosis.  9. The tricuspid valve is normal in structure.  10. The tricuspid valve is normal in structure. Tricuspid valve  regurgitation is mild.  11. Aortic valve regurgitation is not visualized. No evidence of aortic  valve sclerosis or stenosis.  12. 70m CE aortic bioprosthetic valve. No perivalvular leak.   08/27/2019: mean 39 mmHg peak 65 mmHg with DVI 0.26.  (On Xarelto)   Current ECHO 12/15/2019: mean 273mg, peak 5238m (reduced > 69m33mon  Warfarin).  13. Pulmonic regurgitation is mild.  14. The pulmonic valve was normal in structure. Pulmonic valve  regurgitation is mild.  15. Mildly elevated pulmonary artery systolic pressure.  16. The tricuspid regurgitant velocity is 2.52 m/s, and with an assumed  right atrial pressure of 10 mmHg, the estimated right ventricular systolic  pressure is mildly elevated at 35.4 mmHg.  17. The inferior vena cava is normal in size with greater than 50%  respiratory variability, suggesting right atrial pressure of 3 mmHg.   In comparison to the previous echocardiogram(s): Decreased bioprosthetic  CE aortic valve gradient (approx 69mm76mon Coumadin. Discussed with Dr.  NishaJohnsie Cancel  ASSESSMENT & PLAN:    1. Shortness of breath and chest discomfort His symptoms consistent with anxiety and stress related rather than cardiac issue.  He walks regularly and does frequent hiking up to 5 miles each time without chest discomfort or shortness of breath.  No further work-up needed. EKG without acute ischemic changes.   2. Bioprosthetic AVR - Echo 12/2018 showed improved  mean and peak gradients (Peak velocity 3.7 m/sec mean gradient  39->30 mmHg peak 65-> 55 mmHg AVA 0.79 cm2). Plan to repeat echo in 6 months.   3. PAF - maintains sinus rhythm - On coumadin  4. HTN - BP of 140/90 - Repeat check by myself after normal talk 130/80   He has underling anxiety/stress issue. On Zofot. Advised to dicussed by PCP if worsening symptoms.    Medication Adjustments/Labs and Tests Ordered: Current medicines are reviewed at length with the patient today.  Concerns regarding medicines are outlined above.  Medication changes, Labs and Tests ordered today are listed in the Patient Instructions below. Patient Instructions  Medication Instructions:  Your physician recommends that you continue on your current medications  as directed. Please refer to the Current Medication list given to you today.  *If you need a refill on your cardiac medications before your next appointment, please call your pharmacy*   Lab Work: None ordered  If you have labs (blood work) drawn today and your tests are completely normal, you will receive your results only by: . MyCMarland Kitchenart Message (if you have MyChart) OR . A paper copy in the mail If you have any lab test that is abnormal or we need to change your treatment, we will call you to review the results.   Testing/Procedures: None ordered   Follow-Up: At [facility] and your health needs are our priority.  As part of our continuing mission to provide you with exceptional heart care, we have created designated Provider Care Teams.  These Care Teams include your primary Cardiologist (physician) and Advanced Practice Providers (APPs -  Physician Assistants and Nurse Practitioners) who all work together to provide you with the care  you need, when you need it.  We recommend signing up for the patient portal called "MyChart".  Sign up information is provided on this After Visit Summary.  MyChart is used to connect with patients for Virtual Visits (Telemedicine).  Patients are able to view lab/test results, encounter notes, upcoming appointments, etc.  Non-urgent messages can be sent to your provider as well.   To learn more about what you can do with MyChart, go to [email or web address].    Your next appointment:   6 month(s)  The format for your next appointment:   In Person  Provider:   You may see Jenkins Rouge, MD or one of the following Advanced Practice Providers on your designated Care Team:    Truitt Merle, NP  Cecilie Kicks, NP  Kathyrn Drown, NP    Other Instructions      Signed, Leanor Kail, [place]  03/10/2020 3:37 PM    [facility] [address], [place], [place]  [address] Phone: [phone]; Fax: [phone]

## 2020-03-10 NOTE — Patient Instructions (Signed)
Medication Instructions:  °Your physician recommends that you continue on your current medications as directed. Please refer to the Current Medication list given to you today. ° °*If you need a refill on your cardiac medications before your next appointment, please call your pharmacy* ° ° °Lab Work: °None ordered ° °If you have labs (blood work) drawn today and your tests are completely normal, you will receive your results only by: °• MyChart Message (if you have MyChart) OR °• A paper copy in the mail °If you have any lab test that is abnormal or we need to change your treatment, we will call you to review the results. ° ° °Testing/Procedures: °None ordered ° ° °Follow-Up: °At [facility], you and your health needs are our priority.  As part of our continuing mission to provide you with exceptional heart care, we have created designated Provider Care Teams.  These Care Teams include your primary Cardiologist (physician) and Advanced Practice Providers (APPs -  Physician Assistants and Nurse Practitioners) who all work together to provide you with the care you need, when you need it. ° °We recommend signing up for the patient portal called "MyChart".  Sign up information is provided on this After Visit Summary.  MyChart is used to connect with patients for Virtual Visits (Telemedicine).  Patients are able to view lab/test results, encounter notes, upcoming appointments, etc.  Non-urgent messages can be sent to your provider as well.   °To learn more about what you can do with MyChart, go to [email or web address].   ° °Your next appointment:   °6 month(s) ° °The format for your next appointment:   °In Person ° °Provider:   °You may see Peter Nishan, MD or one of the following Advanced Practice Providers on your designated Care Team:   °· Lori Gerhardt, NP °· Laura Ingold, NP °· Jill McDaniel, NP ° ° ° °Other Instructions ° ° °

## 2020-03-13 ENCOUNTER — Telehealth: Payer: Self-pay | Admitting: *Deleted

## 2020-03-13 NOTE — Telephone Encounter (Addendum)
Oral Oncology Patient Advocate Encounter  [name] would like to see if he is able to get Calquence through the [place].  This has caused a delay in the patient getting his medication.  I offered getting him a grant to cover his copay, but he wanted to see if he could get it from the [place] first.  Office notes have been faxed the the [place] (per note in chart) for review to determine if he is eligible to receive Calquence.  I am not sure of the turnaround time for making a decision but will check with him in a few days to see if he has any updates.    [place] Patient [name] Phone [phone] Fax [phone] 03/14/2020 8:39 AM

## 2020-03-13 NOTE — Telephone Encounter (Signed)
Message received from pt requesting that his records be faxed to the [place] at [phone] regarding financial assistance for Calquence.  Dr Antonieta Pert last office note and most recent labs faxed per pt.'s request.

## 2020-03-13 NOTE — Telephone Encounter (Signed)
Oral Oncology Pharmacist Encounter  Received new prescription for Calquence (acalabrutinib) for the treatment of CLL in conjunction with venetoclax after the start of acalabrutinib, planned duration until disease progression or unacceptable drug toxicity.  Prescription dose and frequency assessed.   Current medication list in Epic reviewed, a few DDIs with acalabrutinib identified: -Acalabrutinib may enhance the antiplatelet effect of sertraline and the anticoagulant affect of warfarin. Monitor patient for s/sx of bleeding.  Prescription has been e-scribed to the [facility] for benefits analysis and approval.  Oral Oncology Clinic will continue to follow for insurance authorization, copayment issues, initial counseling and start date.  Darl Pikes, PharmD, BCPS, BCOP, CPP Hematology/Oncology Clinical Pharmacist [facility] [place] [facility] [phone]  03/13/2020 12:43 PM

## 2020-03-14 ENCOUNTER — Encounter: Payer: Self-pay | Admitting: Hematology & Oncology

## 2020-03-15 ENCOUNTER — Ambulatory Visit (HOSPITAL_BASED_OUTPATIENT_CLINIC_OR_DEPARTMENT_OTHER)
Admission: RE | Admit: 2020-03-15 | Discharge: 2020-03-15 | Disposition: A | Discharge status: Home / Self Care | Payer: Medicare Other | Source: Ambulatory Visit | Attending: Hematology & Oncology | Admitting: Hematology & Oncology

## 2020-03-15 ENCOUNTER — Other Ambulatory Visit: Payer: Self-pay

## 2020-03-15 ENCOUNTER — Encounter (HOSPITAL_BASED_OUTPATIENT_CLINIC_OR_DEPARTMENT_OTHER): Payer: Self-pay

## 2020-03-15 DIAGNOSIS — C9110 Chronic lymphocytic leukemia of B-cell type not having achieved remission: Secondary | ICD-10-CM

## 2020-03-15 MED ORDER — IOHEXOL 300 MG/ML  SOLN
100.0000 mL | Freq: Once | INTRAMUSCULAR | Status: AC | PRN
  Administered 2020-03-15: 100 mL via INTRAVENOUS

## 2020-03-16 ENCOUNTER — Encounter: Payer: Self-pay | Admitting: *Deleted

## 2020-03-20 ENCOUNTER — Encounter: Payer: Self-pay | Admitting: Hematology & Oncology

## 2020-03-20 NOTE — Telephone Encounter (Signed)
Called the [place] to check and see if there was a decision made as to if the patient could fill Calquence through the [place].  I was directed to [place] at [place].  I left a voicemail to return my call.  [place] phone number [phone].  [place] Patient [place] Phone [phone] Fax [phone] 03/20/2020 2:25 PM

## 2020-03-21 ENCOUNTER — Encounter: Payer: Self-pay | Admitting: Hematology & Oncology

## 2020-03-21 NOTE — Telephone Encounter (Signed)
[name] returned my call on 5/10 and stated that she is a the [place] clinic, not [place].  She was not the person I needed to talk to, but did state that if the [place] did not refer the patient to Dr [name] they would not cover the medication.  She transferred me back to the main line and I tried reaching the PCP line, but the voicemail was not accepting any messages.    [name] left me a voicemail yesterday and said [facility] to check the status and they had not received any paperwork, or could not find any paperwork from Dr [name].  The [facility] was to call [name] back today.

## 2020-03-22 ENCOUNTER — Other Ambulatory Visit: Payer: Self-pay

## 2020-03-22 MED ORDER — ACALABRUTINIB 100 MG PO CAPS: 100.0000 mg | ORAL_CAPSULE | Freq: Two times a day (BID) | ORAL | 6 refills | Status: DC

## 2020-03-23 ENCOUNTER — Ambulatory Visit (INDEPENDENT_AMBULATORY_CARE_PROVIDER_SITE_OTHER): Payer: Medicare Other | Admitting: Psychology

## 2020-03-23 DIAGNOSIS — F4321 Adjustment disorder with depressed mood: Secondary | ICD-10-CM | POA: Diagnosis not present

## 2020-03-23 NOTE — Telephone Encounter (Signed)
Scheduled patient to receive a 30 day free trial of Calquence on 03/24/20 from [facility].  Patient is trying to obtain medication through the [place].  This will get him started while awaiting a decision.  [place] Patient [place] Phone [phone] Fax [phone] 03/23/2020 3:25 PM

## 2020-03-24 ENCOUNTER — Other Ambulatory Visit: Payer: Medicare Other

## 2020-03-24 ENCOUNTER — Ambulatory Visit: Payer: Medicare Other | Admitting: Hematology & Oncology

## 2020-03-24 ENCOUNTER — Telehealth: Payer: Self-pay | Admitting: Pharmacist

## 2020-03-24 NOTE — Telephone Encounter (Signed)
Oral Chemotherapy Pharmacist Encounter  [name], patient advocate, initially called [name] on 03/13/20 to discussing filling his Calquence prescription, but he wanted to try and get the [place] to fill his Calquence instead. On 03/23/20 [name] was able to convince [name] to let her use a one month free voucher for his Calquence while he was working on getting his medication from the [place] to prevent further delay in his CLL treatment.  Patient Education I spoke with patient for overview of new oral chemotherapy medication: Calquence (acalabrutinib) for the treatment of CLL in conjunction with venetoclax after the start of acalabrutinib, planned duration until disease progression or unacceptable drug toxicity.   Counseled patient on administration, dosing, side effects, monitoring, drug-food interactions, safe handling, storage, and disposal. Patient will take 1 capsule (100 mg total) by mouth 2 (two) times daily.  Side effects include but not limited to: headache, diarrhea, decreased wbc/hgb/plt .    Reviewed with patient importance of keeping a medication schedule and plan for any missed doses.  [name] voiced understanding and appreciation. All questions answered. Medication handout placed in the mail.  Provided patient with Oral [place] phone number. Patient knows to call the office with questions or concerns. Oral Chemotherapy Navigation Clinic will continue to follow.  Darl Pikes, PharmD, BCPS, BCOP, CPP Hematology/Oncology Clinical Pharmacist Practitioner [facility] [phone]  03/24/2020 9:57 AM

## 2020-03-26 ENCOUNTER — Other Ambulatory Visit: Payer: Self-pay

## 2020-03-26 ENCOUNTER — Ambulatory Visit (HOSPITAL_COMMUNITY)
Admission: EM | Admit: 2020-03-26 | Discharge: 2020-03-26 | Disposition: A | Discharge status: Home / Self Care | Payer: Medicare Other | Attending: Emergency Medicine | Admitting: Emergency Medicine

## 2020-03-26 DIAGNOSIS — R220 Localized swelling, mass and lump, head: Secondary | ICD-10-CM

## 2020-03-26 DIAGNOSIS — K047 Periapical abscess without sinus: Secondary | ICD-10-CM | POA: Diagnosis not present

## 2020-03-26 DIAGNOSIS — W57XXXA Bitten or stung by nonvenomous insect and other nonvenomous arthropods, initial encounter: Secondary | ICD-10-CM

## 2020-03-26 MED ORDER — DOXYCYCLINE HYCLATE 100 MG PO CAPS: 200.0000 mg | ORAL_CAPSULE | Freq: Once | ORAL | 0 refills | Status: AC

## 2020-03-26 MED ORDER — AMOXICILLIN-POT CLAVULANATE 875-125 MG PO TABS: 1.0000 | ORAL_TABLET | Freq: Two times a day (BID) | ORAL | 0 refills | Status: AC

## 2020-03-26 NOTE — Discharge Instructions (Signed)
Begin Augmentin twice daily for 1 week-take with food, may use daily yogurt/activita to help prevent any diarrhea associated with this Tylenol for pain every 4-6 hours Warm compresses to face  Please follow-up if any symptoms not improving or worsening developing increased facial swelling, neck stiffness, difficulty swallowing, fevers

## 2020-03-26 NOTE — ED Triage Notes (Signed)
Pt had dental surgery on 4/28, started having facial swelling yesterday. Pt recently started on new oral chemo medication.

## 2020-03-26 NOTE — ED Provider Notes (Signed)
[facility]    CSN: [id number] Arrival date & time: 03/26/20  1005      History   Chief Complaint Chief Complaint  Patient presents with  . Facial Swelling    HPI [name] is a 74 y.o. male history of prior PE, hypertension, paroxysmal A. fib, prostate cancer, presenting today for evaluation of facial swelling.  Patient reports on 4/28 he had a sinus lift as well as screws placed for implant.  Over the past 24-48 hours he has developed increased swelling to his right upper jaw as well as around the right side of his neck.  He reports a dull aching sensation with this.  Denies any associated fevers.  Denies difficulty swallowing or neck stiffness.  He also asks to look at an area which he believes may be an "skin tag".  On examination this appears to be a tick.  Reports that this has been there for approximately 1 week.  Denies fevers, headaches, nausea or vomiting.  Denies other rashes.  Denies dizziness or lightheadedness.  HPI  Past Medical History:  Diagnosis Date  . Anticoagulants causing adverse effect in therapeutic use   . Anxiety    Paxil seemed to cause odd neuro side effects; better on prozac as of 09/2015  . Aortic regurgitation 04/2007 surgery   Resolved with [facility]R at [facility]  . Cervical spondylosis    ESI by [name] in Ortho. 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 Chronic lymphocytic leukemia (CLL), B-cell ([place]) approx 2012   stable on f/u's with [name] (most recent 07/2017)  . History of prostate cancer 2003   Rad prost  . History of pulmonary embolism 2011; 10/2014   Recurrence off of anticoagulation 10/2014: per hem/onc ([name]) pt needs lifelong anticoagulation (hypercoag w/u neg).  . Hyperlipidemia    statin-intolerant, except for crestor low dose.  Marland Kitchen Hypertension   . Migraine syndrome   . Mitral regurgitation   . PAF (paroxysmal atrial fibrillation) ([place])   . Panic attacks    "           "             "                  "                   "                       "                             "                         . Pulmonary nodule, right 2015   RLL: resected at [facility] --VATS; Benign RLL nodule (fungal and AFB stains neg).  Plan is for [facility] thoracic surg to do f/u o/v & CT chest 1 yr   . Right-sided headache 07/2016   Primary stabbing HA or migraine per neuro (Dr. Tomi Likens).  Gabapentin and topamax not tolerated.  These resolved spontaneously.    Patient Active Problem List   Diagnosis Date Noted  . Encounter for therapeutic drug monitoring 09/21/2019  . PTSD (post-traumatic stress disorder) 09/29/2018  . BPPV (benign paroxysmal positional vertigo) 09/04/2017  . CKD (chronic kidney disease), stage III 08/11/2017  . Former smoker  08/29/2016  . Cervical spondylosis   . S/P Bioprosthetic AVR (aortic valve replacement) in 2011 03/22/2015  . Recurrent pulmonary embolism ([place]) 11/09/2014  . Lung nodule 11/02/2014  . Chronic lymphocytic leukemia ([place]) 11/11/2013  . Migraine 10/08/2010  . GERD 12/11/2009  . MITRAL REGURGITATION 04/07/2009  . Insomnia 02/03/2008  . Anxiety state 09/06/2007  . HLD (hyperlipidemia) 04/20/2007  . Essential hypertension 04/20/2007  . Atrial fibrillation ([place]) 04/20/2007  . PROSTATE CANCER, HX OF 04/20/2007    Past Surgical History:  Procedure Laterality Date  . AORTIC VALVE REPLACEMENT  2011   [facility]  (bioprosthetic)  . CARDIAC CATHETERIZATION  04/2010   Normal coronaries.  . Carotid dopplers  08/2015   NORMAL  . CATARACT EXTRACTION     L 12/06/09   R 09/14/09  . COLONOSCOPY  01/26/13   tic's, o/w normal.  ([place])--recall 10 yrs.  Marland Kitchen PENILE PROSTHESIS IMPLANT    . PROSTATECTOMY  04/2002   for prostate cancer  . Pulmonary nodule resection  Fall/winter 2016   [facility]  . SHOULDER SURGERY     rotator cuff on both arms   . TIBIALIS TENDON TRANSFER / REPAIR Right 03/02/2019   Procedure: RECONSTRUCTION OF RIGHT  ANTERIOR TIBIALIS TENDON;  Surgeon: Erle Crocker, MD;  Location: [facility];  Service: Orthopedics;  Laterality: Right;  . TRANSTHORACIC ECHOCARDIOGRAM  09/20/2014; 03/2016; 03/2017   2015-mild LVH, EF 50-55%, wall motion nl, grade I diast dysfxn, prosth aort valve good,transaortic gradients decreased compared to echo 11/2013.   03/2016: EF 55-60%, normal LV function, severe LVH, normal diast fxn, mild increase in AV gradient compared to 09/2014 echo.  2018: EF 55-60%, normal LV fxn, grd II DD, AV stable/gradient's stable.       Home Medications    Prior to Admission medications   Medication Sig Start Date End Date Taking? Authorizing Provider  acalabrutinib (CALQUENCE) 100 MG capsule Take 1 capsule (100 mg total) by mouth 2 (two) times daily. 03/22/20   Volanda Napoleon, MD  amoxicillin (AMOXIL) 500 MG capsule Take 500 mg by mouth 2 (two) times daily.    [provider]  amoxicillin-clavulanate (AUGMENTIN) 875-125 MG tablet Take 1 tablet by mouth every 12 (twelve) hours for 7 days. With food 03/26/20 04/02/20  Ramata Strothman C, PA-C  cholecalciferol (VITAMIN D) 1000 units tablet Take 1,000 Units by mouth daily.    [provider]  doxycycline (VIBRAMYCIN) 100 MG capsule Take 2 capsules (200 mg total) by mouth once for 1 dose. 03/26/20 03/26/20  Naftali Carchi C, PA-C  hydrochlorothiazide (HYDRODIURIL) 12.5 MG tablet TAKE 1 TABLET BY MOUTH EVERY DAY 10/22/19   [name], MD  LORazepam (ATIVAN) 0.5 MG tablet TAKE 1 TABLET BY MOUTH EVERY DAY AS NEEDED FOR ANXIETY 12/29/19   [name], MD  meclizine (ANTIVERT) 12.5 MG tablet TAKE 1 TABLET EVERY DAY AS NEEDED FOR DIZZINESS 02/03/20   [name], MD  predniSONE (STERAPRED UNI-PAK 21 TAB) 10 MG (21) TBPK tablet Take 10 mg by mouth daily.    [provider]  rosuvastatin (CRESTOR) 10 MG tablet TAKE 1 TABLET BY MOUTH EVERY DAY 12/28/19   [name], MD  sertraline (ZOLOFT) 100 MG tablet Take 100 mg by mouth daily.    [provider]  warfarin  (COUMADIN) 5 MG tablet Take 2 tablets daily except 1.5 tablets on Monday, Wednesday, Friday or as directed by Coumadin Clinic. 01/03/20   Josue Hector, MD  Family History Family History  Problem Relation Age of Onset  . Cancer Sister        uterine  . Colon cancer Sister 77  . Stroke Mother   . Prostate cancer Father   . Stroke Sister   . Stroke Brother   . Heart attack Neg Hx     Social History Social History   Tobacco Use  . Smoking status: Former Smoker    Packs/day: 1.00    Years: 12.00    Pack years: 12.00    Types: Cigarettes    Start date: 11/28/1968    Quit date: 01/29/1981    Years since quitting: 39.1  . Smokeless tobacco: Never Used  . Tobacco comment: quit 74 yo   Substance Use Topics  . Alcohol use: Yes    Alcohol/week: 0.0 standard drinks    Comment: occasional  . Drug use: No     Allergies   Hydrocodone-acetaminophen, Oxycodone hcl, Telmisartan-hctz, Ramipril, Aspirin, Atorvastatin, Buspirone hcl, Codeine, Codeine phosphate, Morphine sulfate, Paroxetine, and Rabeprazole   Review of Systems Review of Systems  Constitutional: Negative for activity change, appetite change, chills, fatigue and fever.  HENT: Positive for dental problem and facial swelling. Negative for congestion, ear pain, rhinorrhea, sinus pressure, sore throat and trouble swallowing.   Eyes: Negative for discharge and redness.  Respiratory: Negative for cough, chest tightness and shortness of breath.   Cardiovascular: Negative for chest pain.  Gastrointestinal: Negative for abdominal pain, diarrhea, nausea and vomiting.  Musculoskeletal: Negative for myalgias.  Skin: Negative for rash.  Neurological: Negative for dizziness, light-headedness and headaches.     Physical Exam Triage Vital Signs ED Triage Vitals  Enc Vitals Group     BP 03/26/20 1020 (!) 141/77     Pulse Rate 03/26/20 1019 64     Resp 03/26/20 1019 18     Temp 03/26/20 1019 98.2 F (36.8 C)     Temp src --       SpO2 03/26/20 1019 99 %     Weight --      Height --      Head Circumference --      Peak Flow --      Pain Score 03/26/20 1019 0     Pain Loc --      Pain Edu? --      Excl. in [place]? --    No data found.  Updated Vital Signs BP (!) 141/77   Pulse 64   Temp 98.2 F (36.8 C)   Resp 18   SpO2 99%   Visual Acuity Right Eye Distance:   Left Eye Distance:   Bilateral Distance:    Right Eye Near:   Left Eye Near:    Bilateral Near:     Physical Exam Vitals and nursing note reviewed.  Constitutional:      Appearance: He is well-developed.     Comments: No acute distress  HENT:     Head: Normocephalic and atraumatic.     Comments: Mild facial swelling noted about right maxillary area, swelling noted to right submandibular/tonsillar area below jawline; tender to palpation over this area, palpable lymphadenopathy and tonsillar area    Nose: Nose normal.     Mouth/Throat:     Comments: Gingiva erythematous and sutures present within right upper jaw  Posterior pharynx patent, no soft palate swelling Eyes:     Conjunctiva/sclera: Conjunctivae normal.  Cardiovascular:     Rate and Rhythm: Normal rate.  Pulmonary:  Effort: Pulmonary effort is normal. No respiratory distress.     Comments: Breathing comfortably at rest, CTABL, no wheezing, rales or other adventitious sounds auscultated Abdominal:     General: There is no distension.  Musculoskeletal:        General: Normal range of motion.     Cervical back: Neck supple.  Skin:    General: Skin is warm and dry.  Neurological:     Mental Status: He is alert and oriented to person, place, and time.      UC Treatments / Results  Labs (all labs ordered are listed, but only abnormal results are displayed) Labs Reviewed - No data to display  EKG   Radiology No results found.  Procedures Procedures (including critical care time)  Medications Ordered in UC Medications - No data to display  Initial  Impression / Assessment and Plan / UC Course  I have reviewed the triage vital signs and the nursing notes.  Pertinent labs & imaging results that were available during my care of the patient were reviewed by me and considered in my medical decision making (see chart for details).     Facial swelling concerning for infection, initiating on Augmentin twice daily for 1 week.  Continue Tylenol for pain, warm compresses to face.  Tick removed using tick tornado, no remnants of tick visualized remaining in skin.  Mild erythema to immediate area around bite.  Providing 200 mg doxycycline prophylaxis dose.  Discussed concerns for RMSF/Lyme and monitor for development of any of the symptoms over the next month to follow-up.  Doxycycline and Coumadin with risk of increased thrombosis.  Given this is a one-time dose will opt to proceed, feel benefit greater than risk.  Discussed strict return precautions. Patient verbalized understanding and is agreeable with plan.  Final Clinical Impressions(s) / UC Diagnoses   Final diagnoses:  Facial swelling  Dental infection  Tick bite with subsequent removal of tick     Discharge Instructions     Begin Augmentin twice daily for 1 week-take with food, may use daily yogurt/activita to help prevent any diarrhea associated with this Tylenol for pain every 4-6 hours Warm compresses to face  Please follow-up if any symptoms not improving or worsening developing increased facial swelling, neck stiffness, difficulty swallowing, fevers   ED Prescriptions    Medication Sig Dispense Auth. Provider   amoxicillin-clavulanate (AUGMENTIN) 875-125 MG tablet Take 1 tablet by mouth every 12 (twelve) hours for 7 days. With food 14 tablet Paetyn Pietrzak C, PA-C   doxycycline (VIBRAMYCIN) 100 MG capsule Take 2 capsules (200 mg total) by mouth once for 1 dose. 2 capsule Rahshawn Remo, [place] C, PA-C     PDMP not reviewed this encounter.   Lakresha Stifter, [place] C, PA-C 03/26/20  1046

## 2020-03-27 ENCOUNTER — Encounter: Payer: Self-pay | Admitting: Hematology & Oncology

## 2020-03-28 ENCOUNTER — Other Ambulatory Visit: Payer: Self-pay

## 2020-03-28 ENCOUNTER — Ambulatory Visit: Payer: Medicare Other | Admitting: *Deleted

## 2020-03-28 DIAGNOSIS — I2699 Other pulmonary embolism without acute cor pulmonale: Secondary | ICD-10-CM | POA: Diagnosis not present

## 2020-03-28 DIAGNOSIS — Z5181 Encounter for therapeutic drug level monitoring: Secondary | ICD-10-CM

## 2020-03-28 DIAGNOSIS — I480 Paroxysmal atrial fibrillation: Secondary | ICD-10-CM

## 2020-03-28 DIAGNOSIS — Z952 Presence of prosthetic heart valve: Secondary | ICD-10-CM | POA: Diagnosis not present

## 2020-03-28 LAB — POCT INR: INR: 4.9 — AB (ref 2.0–3.0)

## 2020-03-28 NOTE — Patient Instructions (Signed)
Description   Do not take any Warfarin today and No Warfarin tomorrow then continue taking 2 tablets daily except 2.5 tablets on Mondays.  Recheck INR in 1 week. Coumadin Clinic [phone] Main [phone]

## 2020-03-30 ENCOUNTER — Other Ambulatory Visit: Payer: Self-pay | Admitting: Cardiovascular Disease

## 2020-04-05 ENCOUNTER — Inpatient Hospital Stay (HOSPITAL_BASED_OUTPATIENT_CLINIC_OR_DEPARTMENT_OTHER): Payer: Medicare Other | Admitting: Hematology & Oncology

## 2020-04-05 ENCOUNTER — Inpatient Hospital Stay: Payer: Medicare Other | Attending: Hematology & Oncology

## 2020-04-05 ENCOUNTER — Encounter: Payer: Self-pay | Admitting: Hematology & Oncology

## 2020-04-05 ENCOUNTER — Ambulatory Visit: Payer: Medicare Other | Admitting: *Deleted

## 2020-04-05 ENCOUNTER — Other Ambulatory Visit: Payer: Self-pay

## 2020-04-05 VITALS — BP 114/74 | HR 82 | Temp 96.8°F | Resp 18 | Wt 193.0 lb

## 2020-04-05 DIAGNOSIS — Z79899 Other long term (current) drug therapy: Secondary | ICD-10-CM | POA: Insufficient documentation

## 2020-04-05 DIAGNOSIS — I2699 Other pulmonary embolism without acute cor pulmonale: Secondary | ICD-10-CM | POA: Insufficient documentation

## 2020-04-05 DIAGNOSIS — Z7901 Long term (current) use of anticoagulants: Secondary | ICD-10-CM | POA: Insufficient documentation

## 2020-04-05 DIAGNOSIS — R911 Solitary pulmonary nodule: Secondary | ICD-10-CM | POA: Diagnosis not present

## 2020-04-05 DIAGNOSIS — Z5181 Encounter for therapeutic drug level monitoring: Secondary | ICD-10-CM

## 2020-04-05 DIAGNOSIS — C9110 Chronic lymphocytic leukemia of B-cell type not having achieved remission: Secondary | ICD-10-CM

## 2020-04-05 DIAGNOSIS — Z952 Presence of prosthetic heart valve: Secondary | ICD-10-CM

## 2020-04-05 DIAGNOSIS — I480 Paroxysmal atrial fibrillation: Secondary | ICD-10-CM

## 2020-04-05 LAB — CMP (CANCER CENTER ONLY)
ALT: 14 U/L (ref 0–44)
AST: 16 U/L (ref 15–41)
Albumin: 4.5 g/dL (ref 3.5–5.0)
Alkaline Phosphatase: 50 U/L (ref 38–126)
Anion gap: 8 (ref 5–15)
BUN: 27 mg/dL — ABNORMAL HIGH (ref 8–23)
CO2: 29 mmol/L (ref 22–32)
Calcium: 9.6 mg/dL (ref 8.9–10.3)
Chloride: 102 mmol/L (ref 98–111)
Creatinine: 1.77 mg/dL — ABNORMAL HIGH (ref 0.61–1.24)
GFR, Est AFR Am: 43 mL/min — ABNORMAL LOW (ref >60)
GFR, Estimated: 37 mL/min — ABNORMAL LOW (ref >60)
Glucose, Bld: 128 mg/dL — ABNORMAL HIGH (ref 70–99)
Potassium: 3.9 mmol/L (ref 3.5–5.1)
Sodium: 139 mmol/L (ref 135–145)
Total Bilirubin: 0.5 mg/dL (ref 0.3–1.2)
Total Protein: 6.8 g/dL (ref 6.5–8.1)

## 2020-04-05 LAB — POCT INR: INR: 3.1 — AB (ref 2.0–3.0)

## 2020-04-05 LAB — CBC WITH DIFFERENTIAL (CANCER CENTER ONLY)
Abs Immature Granulocytes: 0.04 10*3/uL (ref 0.00–0.07)
Basophils Absolute: 0.1 10*3/uL (ref 0.0–0.1)
Basophils Relative: 0 %
Eosinophils Absolute: 0 10*3/uL (ref 0.0–0.5)
Eosinophils Relative: 0 %
HCT: 37.8 % — ABNORMAL LOW (ref 39.0–52.0)
Hemoglobin: 12.1 g/dL — ABNORMAL LOW (ref 13.0–17.0)
Immature Granulocytes: 0 %
Lymphocytes Relative: 95 %
Lymphs Abs: 36.8 10*3/uL — ABNORMAL HIGH (ref 0.7–4.0)
MCH: 32.4 pg (ref 26.0–34.0)
MCHC: 32 g/dL (ref 30.0–36.0)
MCV: 101.3 fL — ABNORMAL HIGH (ref 80.0–100.0)
Monocytes Absolute: 0.2 10*3/uL (ref 0.1–1.0)
Monocytes Relative: 1 %
Neutro Abs: 1.6 10*3/uL — ABNORMAL LOW (ref 1.7–7.7)
Neutrophils Relative %: 4 %
Platelet Count: 144 10*3/uL — ABNORMAL LOW (ref 150–400)
RBC: 3.73 MIL/uL — ABNORMAL LOW (ref 4.22–5.81)
RDW: 13 % (ref 11.5–15.5)
WBC Count: 38.8 10*3/uL — ABNORMAL HIGH (ref 4.0–10.5)
nRBC: 0 % (ref 0.0–0.2)

## 2020-04-05 LAB — SAVE SMEAR(SSMR), FOR PROVIDER SLIDE REVIEW

## 2020-04-05 NOTE — Patient Instructions (Signed)
Description   Today take 1 tablet then start taking 2 tablets daily.  Recheck INR in 2 weeks. Coumadin Clinic [phone] Main [phone]

## 2020-04-05 NOTE — Progress Notes (Signed)
Hematology and Oncology Follow Up Visit  [name] [id number] 13-Dec-1945 74 y.o. 04/05/2020   Principle Diagnosis:   Right lower lobe pulmonary nodule- non-malignant  Recurrent pulmonary embolism  CLL -- progressive -- 11q-/13q-  Current Therapy:    Xarelto 20 mg by mouth daily  Calquence 100 mg po BID -- start on 03/13/2020  Venetoclax 400 mg po q day -- start in July 2021       Rituxan/bendamustine-s/p cycle #2 - given 06/18/2017      Interim History:  [name] is back for follow-up.  He is doing okay.  He actually had to go to the emergency room because of some swelling on the right side of his face.  He was found to have a tick on his upper back.  This was removed.  I think he was given some antibiotic for this.  The swelling has gone down.   He has been on the acalabrutinib for about 2 weeks.  Overall I think he is doing okay with this.  He has had no nausea or vomiting.  There has been no palpitations.  He has had no fever.  His house is being remodeled a little bit.  He is living in the basement of his house.  Hopefully the remodel will be completed this week.  He has had no swollen lymph nodes.  He says that the lymph nodes in his left neck is seem to be a little bit better.  His appetite is doing okay.  There is no change in bowel or bladder habits.  He has had no nausea or vomiting.  There has been no leg swelling  He is doing well with his Xarelto.  There is no bleeding.  He is exercising.  He is busy with his new pump.  His dog is definitely keeping him busy and active.  Overall, I would say his performance status is ECOG 1.    Medications:  Current Outpatient Medications:  .  acalabrutinib (CALQUENCE) 100 MG capsule, Take 1 capsule (100 mg total) by mouth 2 (two) times daily., Disp: 60 capsule, Rfl: 6 .  amoxicillin (AMOXIL) 500 MG capsule, Take 500 mg by mouth 2 (two) times daily., Disp: , Rfl:  .  cholecalciferol (VITAMIN D) 1000 units tablet, Take  1,000 Units by mouth daily., Disp: , Rfl:  .  hydrochlorothiazide (HYDRODIURIL) 12.5 MG tablet, TAKE 1 TABLET BY MOUTH EVERY DAY, Disp: 90 tablet, Rfl: 1 .  LORazepam (ATIVAN) 0.5 MG tablet, TAKE 1 TABLET BY MOUTH EVERY DAY AS NEEDED FOR ANXIETY, Disp: 30 tablet, Rfl: 2 .  meclizine (ANTIVERT) 12.5 MG tablet, TAKE 1 TABLET EVERY DAY AS NEEDED FOR DIZZINESS, Disp: 30 tablet, Rfl: 2 .  predniSONE (STERAPRED UNI-PAK 21 TAB) 10 MG (21) TBPK tablet, Take 10 mg by mouth daily., Disp: , Rfl:  .  rosuvastatin (CRESTOR) 10 MG tablet, TAKE 1 TABLET BY MOUTH EVERY DAY, Disp: 90 tablet, Rfl: 3 .  sertraline (ZOLOFT) 100 MG tablet, Take 100 mg by mouth daily., Disp: , Rfl:  .  warfarin (COUMADIN) 5 MG tablet, TAKE AS DIRECTED BY COUMADIN CLINIC, Disp: 195 tablet, Rfl: 1  Allergies:  Allergies  Allergen Reactions  . Hydrocodone-Acetaminophen Shortness Of Breath  . Oxycodone Hcl Shortness Of Breath  . Telmisartan-Hctz Shortness Of Breath and Palpitations    Dizziness, too  . Ramipril   . Aspirin Other (See Comments) and Rash    Other Reaction: confusion Can't take high dose  . Atorvastatin Other (  See Comments)    myalgia  . Buspirone Hcl Other (See Comments)    headache  . Codeine Nausea Only and Other (See Comments)    Other Reaction: confusion  . Codeine Phosphate Itching and Nausea Only  . Morphine Sulfate Itching  . Paroxetine Other (See Comments)    Paresthesias: R arm, R leg, r side of face  . Rabeprazole Other (See Comments)    headache    Past Medical History, Surgical history, Social history, and Family History were reviewed and updated.  Review of Systems: Review of Systems  Constitutional: Negative.   HENT: Negative.   Eyes: Negative.   Respiratory: Negative.   Cardiovascular: Negative.   Gastrointestinal: Negative.   Genitourinary: Negative.   Musculoskeletal: Negative.   Skin: Negative.   Neurological: Negative.   Endo/Heme/Allergies: Negative.   Psychiatric/Behavioral:  Negative.     Physical Exam:  weight is 193 lb (87.5 kg). His temporal temperature is 96.8 F (36 C) (abnormal). His blood pressure is 114/74 and his pulse is 82. His respiration is 18 and oxygen saturation is 96%.   Physical Exam Vitals reviewed.  Constitutional:      Comments: Axillary exam does show increase in adenopathy bilaterally.  He has both left and right axillary adenopathy.  He probably has more bulk in the left axilla.  Marland Kitchen    HENT:     Head: Normocephalic and atraumatic.  Eyes:     Pupils: Pupils are equal, round, and reactive to light.  Neck:     Comments: On his neck exam, he does have adenopathy in the left supraclavicular region.  He has a lymph node probably measures about 2 cm.  Lymph nodes are mobile.  Other lymph nodes in the posterior cervical left chain by measure about 1 cm.  He does have some palpable lymph nodes on the right neck measuring about 5-10 mm.   Cardiovascular:     Rate and Rhythm: Normal rate and regular rhythm.     Heart sounds: Normal heart sounds.  Pulmonary:     Effort: Pulmonary effort is normal.     Breath sounds: Normal breath sounds.  Abdominal:     General: Bowel sounds are normal.     Palpations: Abdomen is soft.  Musculoskeletal:        General: No tenderness or deformity. Normal range of motion.     Cervical back: Normal range of motion.  Lymphadenopathy:     Cervical: No cervical adenopathy.  Skin:    General: Skin is warm and dry.     Findings: No erythema or rash.  Neurological:     Mental Status: He is alert and oriented to person, place, and time.  Psychiatric:        Behavior: Behavior normal.        Thought Content: Thought content normal.        Judgment: Judgment normal.     Lab Results  Component Value Date   WBC 38.8 (H) 04/05/2020   HGB 12.1 (L) 04/05/2020   HCT 37.8 (L) 04/05/2020   MCV 101.3 (H) 04/05/2020   PLT 144 (L) 04/05/2020     Chemistry      Component Value Date/Time   NA 139 04/05/2020 1450    NA 145 07/22/2017 0756   NA 139 06/27/2016 1410   K 3.9 04/05/2020 1450   K 4.3 07/22/2017 0756   K 5.1 06/27/2016 1410   CL 102 04/05/2020 1450   CL 105 07/22/2017 0756  CO2 29 04/05/2020 1450   CO2 30 07/22/2017 0756   CO2 28 06/27/2016 1410   BUN 27 (H) 04/05/2020 1450   BUN 17 07/22/2017 0756   BUN 20.9 06/27/2016 1410   CREATININE 1.77 (H) 04/05/2020 1450   CREATININE 1.6 (H) 07/22/2017 0756   CREATININE 1.6 (H) 06/27/2016 1410      Component Value Date/Time   CALCIUM 9.6 04/05/2020 1450   CALCIUM 9.6 07/22/2017 0756   CALCIUM 9.6 06/27/2016 1410   ALKPHOS 50 04/05/2020 1450   ALKPHOS 59 07/22/2017 0756   ALKPHOS 62 06/27/2016 1410   AST 16 04/05/2020 1450   AST 18 06/27/2016 1410   ALT 14 04/05/2020 1450   ALT 24 07/22/2017 0756   ALT 24 06/27/2016 1410   BILITOT 0.5 04/05/2020 1450   BILITOT 0.46 06/27/2016 1410      Impression and Plan: Mr. Asberry is 74 year-old African-American gentleman.  He has recurrent CLL.  We started him on acalabrutinib a couple weeks ago.  He is doing okay with this.  Of note, because he is affiliated with the [place] system, he is to see a hematologist with the [place].  I think this is probably one of the hematologist at [facility]  I feel very good about this.  Again, I would not start the venetoclax on him until his white cell count gets below 20,000.  He does feel as if his lymph nodes are improving a little bit.  This I think is a good indicator that treatment is effective.  I think we can probably get her back in 6 weeks now.  I will try to get him back after the July 4 holiday.  Hopefully, he will be able to utilize his entire house once the remodeling gets completed.          Marland KitchenVolanda Napoleon, MD 5/26/20213:42 PM

## 2020-04-06 ENCOUNTER — Telehealth: Payer: Self-pay | Admitting: Hematology & Oncology

## 2020-04-06 ENCOUNTER — Other Ambulatory Visit: Payer: Self-pay | Admitting: Family Medicine

## 2020-04-06 LAB — PROTEIN ELECTROPHORESIS, SERUM, WITH REFLEX
A/G Ratio: 1.7 (ref 0.7–1.7)
Albumin ELP: 4 g/dL (ref 2.9–4.4)
Alpha-1-Globulin: 0.2 g/dL (ref 0.0–0.4)
Alpha-2-Globulin: 0.6 g/dL (ref 0.4–1.0)
Beta Globulin: 1 g/dL (ref 0.7–1.3)
Gamma Globulin: 0.6 g/dL (ref 0.4–1.8)
Globulin, Total: 2.4 g/dL (ref 2.2–3.9)
Total Protein ELP: 6.4 g/dL (ref 6.0–8.5)

## 2020-04-06 LAB — KAPPA/LAMBDA LIGHT CHAINS
Kappa free light chain: 52.4 mg/L — ABNORMAL HIGH (ref 3.3–19.4)
Kappa, lambda light chain ratio: 4.16 — ABNORMAL HIGH (ref 0.26–1.65)
Lambda free light chains: 12.6 mg/L (ref 5.7–26.3)

## 2020-04-06 LAB — IGG, IGA, IGM
IgA: 90 mg/dL (ref 61–437)
IgG (Immunoglobin G), Serum: 685 mg/dL (ref 603–1613)
IgM (Immunoglobulin M), Srm: 97 mg/dL (ref 15–143)

## 2020-04-06 LAB — LACTATE DEHYDROGENASE: LDH: 296 U/L — ABNORMAL HIGH (ref 98–192)

## 2020-04-06 NOTE — Telephone Encounter (Signed)
Appointments scheduled calendar printed &  mailed per 5/26 los °

## 2020-04-14 ENCOUNTER — Other Ambulatory Visit: Payer: Self-pay | Admitting: Family Medicine

## 2020-04-18 ENCOUNTER — Encounter: Payer: Self-pay | Admitting: Hematology & Oncology

## 2020-04-18 ENCOUNTER — Ambulatory Visit: Payer: Medicare Other | Admitting: *Deleted

## 2020-04-18 ENCOUNTER — Other Ambulatory Visit: Payer: Self-pay

## 2020-04-18 DIAGNOSIS — Z5181 Encounter for therapeutic drug level monitoring: Secondary | ICD-10-CM

## 2020-04-18 DIAGNOSIS — I2699 Other pulmonary embolism without acute cor pulmonale: Secondary | ICD-10-CM | POA: Diagnosis not present

## 2020-04-18 DIAGNOSIS — I480 Paroxysmal atrial fibrillation: Secondary | ICD-10-CM | POA: Diagnosis not present

## 2020-04-18 DIAGNOSIS — Z952 Presence of prosthetic heart valve: Secondary | ICD-10-CM | POA: Diagnosis not present

## 2020-04-18 LAB — POCT INR: INR: 6 — AB (ref 2.0–3.0)

## 2020-04-18 LAB — PROTIME-INR
INR: 5.3 (ref 0.9–1.2)
Prothrombin Time: 56.3 s — ABNORMAL HIGH (ref 9.1–12.0)

## 2020-04-20 ENCOUNTER — Encounter: Payer: Self-pay | Admitting: *Deleted

## 2020-04-20 ENCOUNTER — Ambulatory Visit: Payer: Medicare Other | Admitting: Psychology

## 2020-04-20 NOTE — Telephone Encounter (Signed)
Spoke with [name] yesterday afternoon to let him know about getting his prescription from the [place].  I spoke with [name], nurse with [facility], and she gave me the fax number to the [facility] pharmacy.  She said that [facility] is the pharmacist and would process his prescription.  I asked [name] Edu, RN for Dr [name], to fax the prescription to [phone] and to include a cover sheet with a contact person and phone number for any issues.    [name] gave me the names and numbers for the patients nurse navigators and are listed below.  [name] - [phone] [place] [phone]  [facility] - [phone] ext [phone] (pharmacist)  [name] - [phone] ext [place] Patient [place] Phone [phone] Fax [phone] 04/20/2020 10:29 AM

## 2020-04-20 NOTE — Progress Notes (Signed)
Signed prescription for Calquence faxed to [phone] attn: Sharol Roussel at the [facility] per request of Dennison Nancy.

## 2020-04-21 ENCOUNTER — Encounter: Payer: Self-pay | Admitting: *Deleted

## 2020-04-21 NOTE — Progress Notes (Signed)
Calquence appeal letter faxed to Sharol Roussel PharmD at [phone] at the [facility].

## 2020-04-26 MED FILL — CALQUENCE 100 MG CAPSULE: 100 | 3 days supply | Qty: 6 | Fill #1

## 2020-04-26 NOTE — Telephone Encounter (Signed)
Mr. Arps called about his prescription status with the [place].  He said that he took his last capsule on Sunday.  [facility] by tomorrow 6/17 from the [place] location.  He wanted to pay for a short supply of Calquence to get him through until the shipment came.  I processed his prescription for a 3 day supply and he is going to pick it up from [facility] today.  The cost will be $9.99 and he was ok with paying that.  [place] [facility] [place] Phone [phone] Fax [phone] 04/26/2020 1:42 PM

## 2020-04-27 ENCOUNTER — Ambulatory Visit: Payer: Medicare Other | Admitting: Pharmacist

## 2020-04-27 ENCOUNTER — Other Ambulatory Visit: Payer: Self-pay

## 2020-04-27 DIAGNOSIS — Z5181 Encounter for therapeutic drug level monitoring: Secondary | ICD-10-CM

## 2020-04-27 DIAGNOSIS — I2699 Other pulmonary embolism without acute cor pulmonale: Secondary | ICD-10-CM | POA: Diagnosis not present

## 2020-04-27 DIAGNOSIS — I480 Paroxysmal atrial fibrillation: Secondary | ICD-10-CM | POA: Diagnosis not present

## 2020-04-27 DIAGNOSIS — Z952 Presence of prosthetic heart valve: Secondary | ICD-10-CM

## 2020-04-27 LAB — POCT INR: INR: 2.3 (ref 2.0–3.0)

## 2020-04-27 NOTE — Patient Instructions (Addendum)
Description   Take 1 tablet on Sunday and Thursday and 2 tablets every other day of the week.  Recheck INR in 2 weeks. Please call with any questions or concerns please call Coumadin Clinic [phone] Main [phone]

## 2020-05-11 ENCOUNTER — Ambulatory Visit (INDEPENDENT_AMBULATORY_CARE_PROVIDER_SITE_OTHER): Payer: Medicare Other | Admitting: Family Medicine

## 2020-05-11 ENCOUNTER — Ambulatory Visit: Payer: Medicare Other | Admitting: *Deleted

## 2020-05-11 ENCOUNTER — Encounter: Payer: Self-pay | Admitting: Family Medicine

## 2020-05-11 ENCOUNTER — Other Ambulatory Visit: Payer: Self-pay

## 2020-05-11 VITALS — BP 130/80 | HR 63 | Temp 98.2°F | Ht 70.0 in | Wt 196.6 lb

## 2020-05-11 DIAGNOSIS — Z0000 Encounter for general adult medical examination without abnormal findings: Secondary | ICD-10-CM | POA: Diagnosis not present

## 2020-05-11 DIAGNOSIS — I2699 Other pulmonary embolism without acute cor pulmonale: Secondary | ICD-10-CM | POA: Diagnosis not present

## 2020-05-11 DIAGNOSIS — Z952 Presence of prosthetic heart valve: Secondary | ICD-10-CM

## 2020-05-11 DIAGNOSIS — Z5181 Encounter for therapeutic drug level monitoring: Secondary | ICD-10-CM | POA: Diagnosis not present

## 2020-05-11 DIAGNOSIS — E785 Hyperlipidemia, unspecified: Secondary | ICD-10-CM

## 2020-05-11 DIAGNOSIS — I480 Paroxysmal atrial fibrillation: Secondary | ICD-10-CM

## 2020-05-11 DIAGNOSIS — C9110 Chronic lymphocytic leukemia of B-cell type not having achieved remission: Secondary | ICD-10-CM

## 2020-05-11 DIAGNOSIS — N183 Chronic kidney disease, stage 3 unspecified: Secondary | ICD-10-CM

## 2020-05-11 DIAGNOSIS — Z8546 Personal history of malignant neoplasm of prostate: Secondary | ICD-10-CM

## 2020-05-11 LAB — POCT INR: INR: 3.1 — AB (ref 2.0–3.0)

## 2020-05-11 NOTE — Progress Notes (Signed)
Phone: [phone]   Subjective:  Patient presents today for their annual physical. Chief complaint-noted.   See problem oriented charting- Review of Systems  Constitutional: Negative for chills and fever.  HENT: Negative for congestion, ear discharge and hearing loss.   Eyes: Positive for blurred vision (occasionally with new CLL treatment. feels like poor focus. ). Negative for double vision, pain and redness.  Respiratory: Negative for cough and shortness of breath.   Cardiovascular: Negative for chest pain and palpitations.  Gastrointestinal: Negative for constipation, diarrhea, nausea and vomiting.  Genitourinary: Negative for dysuria and frequency.  Musculoskeletal: Positive for joint pain and myalgias.  Skin: Negative for itching and rash.  Neurological: Positive for headaches (mild since new Cll treatment). Negative for tremors and weakness.  Endo/Heme/Allergies: Negative for polydipsia. Bruises/bleeds easily.  Psychiatric/Behavioral: Negative for hallucinations, substance abuse and suicidal ideas.   The following were reviewed and entered/updated in epic: Past Medical History:  Diagnosis Date  . Anticoagulants causing adverse effect in therapeutic use   . Anxiety    Paxil seemed to cause odd neuro side effects; better on prozac as of 09/2015  . Aortic regurgitation 04/2007 surgery   Resolved with tissue AVR at [facility]  . Cervical spondylosis    ESI by [name] in Ortho. 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 Chronic lymphocytic leukemia (CLL), B-cell ([place]) approx 2012   stable on f/u's with [name] (most recent 07/2017)  . History of prostate cancer 2003   Rad prost  . History of pulmonary embolism 2011; 10/2014   Recurrence off of anticoagulation 10/2014: per hem/onc ([name]) pt needs lifelong anticoagulation (hypercoag w/u neg).  . Hyperlipidemia    statin-intolerant, except for crestor low dose.  Marland Kitchen Hypertension   . Migraine syndrome   . Mitral regurgitation   .  PAF (paroxysmal atrial fibrillation) ([place])   . Panic attacks    "           "             "                  "                  "                       "                             "                         . Pulmonary nodule, right 2015   RLL: resected at [facility]; Benign RLL nodule (fungal and AFB stains neg).  Plan is for [facility] thoracic surg to do f/u o/v & CT chest 1 yr   . Right-sided headache 07/2016   Primary stabbing HA or migraine per neuro (Dr. Tomi Likens).  Gabapentin and topamax not tolerated.  These resolved spontaneously.   Patient Active Problem List   Diagnosis Date Noted  . S/P Bioprosthetic AVR (aortic valve replacement) in 2011 03/22/2015    Priority: High  . Recurrent pulmonary embolism ([place]) 11/09/2014    Priority: High  . Chronic lymphocytic leukemia ([place]) 11/11/2013    Priority: High  . Atrial fibrillation ([place]) 04/20/2007    Priority: High  . PROSTATE CANCER, HX OF 04/20/2007    Priority: High  .  PTSD (post-traumatic stress disorder) 09/29/2018    Priority: Medium  . BPPV (benign paroxysmal positional vertigo) 09/04/2017    Priority: Medium  . CKD (chronic kidney disease), stage III 08/11/2017    Priority: Medium  . Cervical spondylosis     Priority: Medium  . Migraine 10/08/2010    Priority: Medium  . Insomnia 02/03/2008    Priority: Medium  . Anxiety state 09/06/2007    Priority: Medium  . HLD (hyperlipidemia) 04/20/2007    Priority: Medium  . Essential hypertension 04/20/2007    Priority: Medium  . Former smoker 08/29/2016    Priority: Low  . Lung nodule 11/02/2014    Priority: Low  . GERD 12/11/2009    Priority: Low  . MITRAL REGURGITATION 04/07/2009    Priority: Low  . Encounter for therapeutic drug monitoring 09/21/2019   Past Surgical History:  Procedure Laterality Date  . AORTIC VALVE REPLACEMENT  2011   [facility]  (bioprosthetic)  . CARDIAC CATHETERIZATION  04/2010   Normal coronaries.  . Carotid dopplers  08/2015   NORMAL  . CATARACT  EXTRACTION     L 12/06/09   R 09/14/09  . COLONOSCOPY  01/26/13   tic's, o/w normal.  ([facility])--recall 10 yrs.  Marland Kitchen PENILE PROSTHESIS IMPLANT    . PROSTATECTOMY  04/2002   for prostate cancer  . Pulmonary nodule resection  Fall/winter 2016   [facility]  . SHOULDER SURGERY     rotator cuff on both arms   . TIBIALIS TENDON TRANSFER / REPAIR Right 03/02/2019   Procedure: RECONSTRUCTION OF RIGHT  ANTERIOR TIBIALIS TENDON;  Surgeon: Erle Crocker, MD;  Location: [place];  Service: Orthopedics;  Laterality: Right;  . TRANSTHORACIC ECHOCARDIOGRAM  09/20/2014; 03/2016; 03/2017   2015-mild LVH, EF 50-55%, wall motion nl, grade I diast dysfxn, prosth aort valve good,transaortic gradients decreased compared to echo 11/2013.   03/2016: EF 55-60%, normal LV function, severe LVH, normal diast fxn, mild increase in AV gradient compared to 09/2014 echo.  2018: EF 55-60%, normal LV fxn, grd II DD, AV stable/gradient's stable.    Family History  Problem Relation Age of Onset  . Cancer Sister        uterine  . Colon cancer Sister 28  . Stroke Mother   . Prostate cancer Father   . Stroke Sister   . Stroke Brother   . Heart attack Neg Hx     Medications- reviewed and updated Current Outpatient Medications  Medication Sig Dispense Refill  . acalabrutinib (CALQUENCE) 100 MG capsule Take 1 capsule (100 mg total) by mouth 2 (two) times daily. 60 capsule 6  . amoxicillin (AMOXIL) 500 MG capsule Take 500 mg by mouth 2 (two) times daily.    . cholecalciferol (VITAMIN D) 1000 units tablet Take 1,000 Units by mouth daily.    . hydrochlorothiazide (HYDRODIURIL) 12.5 MG tablet TAKE 1 TABLET BY MOUTH EVERY DAY 90 tablet 1  . LORazepam (ATIVAN) 0.5 MG tablet TAKE 1 TABLET BY MOUTH EVERY DAY AS NEEDED FOR ANXIETY 30 tablet 2  . meclizine (ANTIVERT) 12.5 MG tablet TAKE 1 TABLET EVERY DAY AS NEEDED FOR DIZZINESS 30 tablet 2  . rosuvastatin (CRESTOR) 10 MG tablet TAKE 1 TABLET BY MOUTH EVERY DAY 90 tablet 3   . sertraline (ZOLOFT) 100 MG tablet Take 100 mg by mouth daily.    Marland Kitchen warfarin (COUMADIN) 5 MG tablet TAKE AS DIRECTED BY COUMADIN CLINIC 195 tablet 1   No current facility-administered medications for this visit.  Allergies-reviewed and updated Allergies  Allergen Reactions  . Hydrocodone-Acetaminophen Shortness Of Breath  . Oxycodone Hcl Shortness Of Breath  . Telmisartan-Hctz Shortness Of Breath and Palpitations    Dizziness, too  . Ramipril   . Aspirin Other (See Comments) and Rash    Other Reaction: confusion Can't take high dose  . Atorvastatin Other (See Comments)    myalgia  . Buspirone Hcl Other (See Comments)    headache  . Codeine Nausea Only and Other (See Comments)    Other Reaction: confusion  . Codeine Phosphate Itching and Nausea Only  . Morphine Sulfate Itching  . Paroxetine Other (See Comments)    Paresthesias: R arm, R leg, r side of face  . Rabeprazole Other (See Comments)    headache    Social History   Social History Narrative   Single. No children. 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       Hobbies: travel, Investment banker, operational, enjoys being outdoors including hiking   Objective  Objective:  BP 130/80   Pulse 63   Temp 98.2 F (36.8 C)   Ht 5\' 10"  (1.778 m)   Wt 196 lb 9.6 oz (89.2 kg)   SpO2 95%   BMI 28.21 kg/m  Gen: NAD, resting comfortably HEENT: Mucous membranes are moist. Oropharynx normal Neck: no thyromegaly CV: RRR no murmurs rubs or gallops Lungs: CTAB no crackles, wheeze, rhonchi Abdomen: soft/nontender/nondistended/normal bowel sounds. No rebound or guarding.  Ext: no edema Skin: warm, dry Neuro: grossly normal, moves all extremities, PERRLA    Assessment and Plan  74 y.o. male presenting for annual physical.  Health Maintenance counseling: 1. Anticipatory guidance: Patient counseled regarding regular dental exams q6 months- much more than this with getting some work done, eye exams yearly,   avoiding smoking and second hand smoke , limiting alcohol to 2 beverages per day- none since chemotherapy  2. Risk factor reduction:  Advised patient of need for regular exercise and diet rich and fruits and vegetables to reduce risk of heart attack and stroke. Exercise- walks, swims and teaches ti chi. Diet- slight weight loss on CLL treatment- but thinks bigger issue was having work done in the 66 from Last 3 Encounters:  05/11/20 196 lb 9.6 oz (89.2 kg)  04/05/20 193 lb (87.5 kg)  03/10/20 200 lb 9.6 oz (91 kg)  3. Immunizations/screenings/ancillary studies- up to date other than shingrix.  Immunization History  Administered Date(s) Administered  . Influenza Split 08/16/2011, 08/14/2012  . Influenza Whole 08/17/2010  . Influenza, High Dose Seasonal PF 08/23/2013, 08/29/2016, 08/11/2017, 06/24/2019  . Influenza,inj,Quad PF,6+ Mos 09/05/2014, 08/02/2015  . Influenza-Unspecified 06/29/2018  . PFIZER SARS-COV-2 Vaccination 12/12/2019, 01/02/2020  . Pneumococcal Conjugate-13 01/23/2015  . Pneumococcal Polysaccharide-23 03/15/2011  . Td 04/16/2007  . Tdap 04/14/2018  . Zoster 08/16/2011  4. Prostate cancer screening- prostatectomy 2008 - will trend psa Lab Results  Component Value Date   PSA 0.00 (L) 09/29/2018   PSA 0.01 (L) 11/16/2014   PSA 0.00 (L) 03/30/2013   5. Colon cancer screening - 2014 with 10 year repeat 6. Skin cancer screening- lower risk due to melanin content. advised regular sunscreen use. Denies worrisome, changing, or new skin lesions.  7. former smoker- quit in 1980s.  AAA screened by ct abd pelvis 2016 negative.  8. STD screening - declines- not active at present  Status of chronic or acute concerns   #CLL- now on calquence. Lymphadenopathy is improving. Only  occasional headache on this- overall feels pretty good- walking/exercising/sweimming still - gets med through [place]- would be $1400 per month otherwise - WBC actually increases with this. May  have to add another pill to cover for high white blood cells  #poor sleep-  not sleeping the best with new pup Dylan- jumps up in bed at times.   # s/p bioprosthetic AVR- due to thrombus on AVR- now on coumadin per cardiology - this also treats/covers recurrent pulmonary embolism- will be on lifes long therapy - coumadin also covers for a fib. Not on beta blocker. No recurrence of a fib since avr.  -all issues stable- continue current meds  #Chronic kidney disease stage III S: GFR is typically in the 40s range -Patient knows to avoid NSAIDs . Does take acetaminophen A/P: hopefully stable- update cmp   #hypertension S: medication: hctz 12.'5mg'$  Home readings #s: does not check BP Readings from Last 3 Encounters:  05/11/20 130/80  04/05/20 114/74  03/26/20 (!) 141/77  A/P: Stable. Continue current medications.   # Anxiety/ptsd S:Medication: zoloft '100mg'$ . Also uses ativan half tablet daily since starting calquence.  Feels like has reasonable control COunseling- sees Dr. Cheryln Manly GAD 7 : Generalized Anxiety Score 05/04/2019 06/15/2018 03/27/2018  Nervous, Anxious, on Edge '1 2 1  '$ Control/stop worrying 1 2 0  Worry too much - different things 1 1 0  Trouble relaxing '1 1 1  '$ Restless 0 1 1  Easily annoyed or irritable 0 1 0  Afraid - awful might happen 0 2 0  Total GAD 7 Score '4 10 3  '$ Anxiety Difficulty Not difficult at all Somewhat difficult Not difficult at all  A/P: reasonable control despite recent stressor of starting new med for CLL- will continue current meds   #hyperlipidemia S: Medication:rosuvastatin '10mg'$  daily Lab Results  Component Value Date   CHOL 184 09/29/2018   HDL 41.60 09/29/2018   LDLCALC 146 (H) 08/11/2017   LDLDIRECT 105.0 09/29/2018   TRIG 226.0 (H) 09/29/2018   CHOLHDL 4 09/29/2018   A/P: ideally LDL would be under 70 but would like at least under 100- update lipid panel  Recommended follow up: Return in about 6 months (around 11/11/2020) for follow up- or  sooner if needed. Future Appointments  Date Time Provider [place]  05/18/2020  8:30 AM [facility] None  05/18/2020  9:00 AM Ennever, Rudell Cobb, MD [facility] None  05/18/2020  1:00 PM Oren Binet, PhD [facility] None  06/01/2020  1:15 PM [facility]   Lab/Order associations: not fasting- will come back fasting    ICD-10-CM   1. Preventative health care  Z00.00 Comprehensive metabolic panel    CBC with Differential/Platelet    Lipid panel    PSA  2. Hyperlipidemia, unspecified hyperlipidemia type  E78.5 Comprehensive metabolic panel    CBC with Differential/Platelet    Lipid panel  3. Paroxysmal atrial fibrillation (HCC)  I48.0   4. S/P Bioprosthetic AVR (aortic valve replacement) in 2011  Z95.2   5. Recurrent pulmonary embolism (HCC)  I26.99   6. Chronic lymphocytic leukemia (HCC)  C91.10   7. PROSTATE CANCER, HX OF  Z85.46 PSA  8. Stage 3 chronic kidney disease, unspecified whether stage 3a or 3b CKD  N18.30     No orders of the defined types were placed in this encounter.   Return precautions advised.  Garret Reddish, MD

## 2020-05-11 NOTE — Patient Instructions (Addendum)
Description   Please have a dark green leafy veggie today or tomorrow then continue taking 2 tablets daily except 1 tablet on Sundays and Thursdays.  Recheck INR in 3 weeks. Please call with any questions or concerns please call Coumadin Clinic [phone] Main [phone]

## 2020-05-11 NOTE — Patient Instructions (Addendum)
Schedule a lab visit at the check out desk within 2 weeks. Return for future fasting labs meaning nothing but water after midnight please. Ok to take your medications with water.   No changes today  Keep me in the loop- I will read along with Dr. Antonieta Pert notes and check in 6 months from now

## 2020-05-12 ENCOUNTER — Other Ambulatory Visit (INDEPENDENT_AMBULATORY_CARE_PROVIDER_SITE_OTHER): Payer: Medicare Other

## 2020-05-12 DIAGNOSIS — Z0000 Encounter for general adult medical examination without abnormal findings: Secondary | ICD-10-CM

## 2020-05-12 DIAGNOSIS — E785 Hyperlipidemia, unspecified: Secondary | ICD-10-CM | POA: Diagnosis not present

## 2020-05-12 DIAGNOSIS — Z8546 Personal history of malignant neoplasm of prostate: Secondary | ICD-10-CM | POA: Diagnosis not present

## 2020-05-12 LAB — COMPREHENSIVE METABOLIC PANEL
ALT: 14 U/L (ref 0–53)
AST: 18 U/L (ref 0–37)
Albumin: 4.5 g/dL (ref 3.5–5.2)
Alkaline Phosphatase: 62 U/L (ref 39–117)
BUN: 21 mg/dL (ref 6–23)
CO2: 29 mEq/L (ref 19–32)
Calcium: 9.3 mg/dL (ref 8.4–10.5)
Chloride: 104 mEq/L (ref 96–112)
Creatinine, Ser: 1.42 mg/dL (ref 0.40–1.50)
GFR: 58.93 mL/min — ABNORMAL LOW (ref >60.00)
Glucose, Bld: 94 mg/dL (ref 70–99)
Potassium: 4.2 mEq/L (ref 3.5–5.1)
Sodium: 140 mEq/L (ref 135–145)
Total Bilirubin: 0.4 mg/dL (ref 0.2–1.2)
Total Protein: 6.3 g/dL (ref 6.0–8.3)

## 2020-05-12 LAB — LIPID PANEL
Cholesterol: 181 mg/dL (ref 0–200)
HDL: 48.5 mg/dL (ref >39.00)
LDL Cholesterol: 110 mg/dL — ABNORMAL HIGH (ref 0–99)
NonHDL: 132.96
Total CHOL/HDL Ratio: 4
Triglycerides: 114 mg/dL (ref 0.0–149.0)
VLDL: 22.8 mg/dL (ref 0.0–40.0)

## 2020-05-12 LAB — CBC WITH DIFFERENTIAL/PLATELET
Basophils Absolute: 0.1 10*3/uL (ref 0.0–0.1)
Basophils Relative: 0.2 % (ref 0.0–3.0)
Eosinophils Absolute: 0.1 10*3/uL (ref 0.0–0.7)
Eosinophils Relative: 0.1 % (ref 0.0–5.0)
HCT: 37.8 % — ABNORMAL LOW (ref 39.0–52.0)
Hemoglobin: 12.4 g/dL — ABNORMAL LOW (ref 13.0–17.0)
Lymphocytes Relative: 96.1 % — ABNORMAL HIGH (ref 12.0–46.0)
Lymphs Abs: 65.1 10*3/uL — ABNORMAL HIGH (ref 0.7–4.0)
MCHC: 32.7 g/dL (ref 30.0–36.0)
MCV: 101.9 fl — ABNORMAL HIGH (ref 78.0–100.0)
Monocytes Absolute: 1.1 10*3/uL — ABNORMAL HIGH (ref 0.1–1.0)
Monocytes Relative: 1.6 % — ABNORMAL LOW (ref 3.0–12.0)
Neutro Abs: 1.4 10*3/uL (ref 1.4–7.7)
Neutrophils Relative %: 2 % — ABNORMAL LOW (ref 43.0–77.0)
Platelets: 121 10*3/uL — ABNORMAL LOW (ref 150.0–400.0)
RBC: 3.71 Mil/uL — ABNORMAL LOW (ref 4.22–5.81)
RDW: 14.1 % (ref 11.5–15.5)
WBC: 67.7 10*3/uL (ref 4.0–10.5)

## 2020-05-12 LAB — PSA: PSA: 0 ng/mL — ABNORMAL LOW (ref 0.10–4.00)

## 2020-05-17 ENCOUNTER — Encounter: Payer: Self-pay | Admitting: Hematology & Oncology

## 2020-05-18 ENCOUNTER — Telehealth: Payer: Self-pay | Admitting: Hematology & Oncology

## 2020-05-18 ENCOUNTER — Encounter: Payer: Self-pay | Admitting: Hematology & Oncology

## 2020-05-18 ENCOUNTER — Telehealth: Payer: Self-pay | Admitting: *Deleted

## 2020-05-18 ENCOUNTER — Other Ambulatory Visit: Payer: Self-pay

## 2020-05-18 ENCOUNTER — Inpatient Hospital Stay (HOSPITAL_BASED_OUTPATIENT_CLINIC_OR_DEPARTMENT_OTHER): Payer: No Typology Code available for payment source | Admitting: Hematology & Oncology

## 2020-05-18 ENCOUNTER — Inpatient Hospital Stay: Payer: No Typology Code available for payment source | Attending: Hematology & Oncology

## 2020-05-18 ENCOUNTER — Ambulatory Visit (INDEPENDENT_AMBULATORY_CARE_PROVIDER_SITE_OTHER): Payer: Medicare Other | Admitting: Psychology

## 2020-05-18 VITALS — BP 117/65 | HR 62 | Temp 96.2°F | Resp 20 | Wt 196.1 lb

## 2020-05-18 DIAGNOSIS — I2699 Other pulmonary embolism without acute cor pulmonale: Secondary | ICD-10-CM | POA: Insufficient documentation

## 2020-05-18 DIAGNOSIS — R918 Other nonspecific abnormal finding of lung field: Secondary | ICD-10-CM | POA: Insufficient documentation

## 2020-05-18 DIAGNOSIS — C9110 Chronic lymphocytic leukemia of B-cell type not having achieved remission: Secondary | ICD-10-CM

## 2020-05-18 DIAGNOSIS — Z7901 Long term (current) use of anticoagulants: Secondary | ICD-10-CM | POA: Diagnosis not present

## 2020-05-18 DIAGNOSIS — Z79899 Other long term (current) drug therapy: Secondary | ICD-10-CM | POA: Insufficient documentation

## 2020-05-18 DIAGNOSIS — F4321 Adjustment disorder with depressed mood: Secondary | ICD-10-CM | POA: Diagnosis not present

## 2020-05-18 LAB — CBC WITH DIFFERENTIAL (CANCER CENTER ONLY)
Abs Immature Granulocytes: 0.05 10*3/uL (ref 0.00–0.07)
Basophils Absolute: 0 10*3/uL (ref 0.0–0.1)
Basophils Relative: 0 %
Eosinophils Absolute: 0.1 10*3/uL (ref 0.0–0.5)
Eosinophils Relative: 0 %
HCT: 37 % — ABNORMAL LOW (ref 39.0–52.0)
Hemoglobin: 11.9 g/dL — ABNORMAL LOW (ref 13.0–17.0)
Immature Granulocytes: 0 %
Lymphocytes Relative: 98 %
Lymphs Abs: 71.6 10*3/uL — ABNORMAL HIGH (ref 0.7–4.0)
MCH: 33.1 pg (ref 26.0–34.0)
MCHC: 32.2 g/dL (ref 30.0–36.0)
MCV: 103.1 fL — ABNORMAL HIGH (ref 80.0–100.0)
Monocytes Absolute: 0.3 10*3/uL (ref 0.1–1.0)
Monocytes Relative: 0 %
Neutro Abs: 1.2 10*3/uL — ABNORMAL LOW (ref 1.7–7.7)
Neutrophils Relative %: 2 %
Platelet Count: 108 10*3/uL — ABNORMAL LOW (ref 150–400)
RBC: 3.59 MIL/uL — ABNORMAL LOW (ref 4.22–5.81)
RDW: 13.8 % (ref 11.5–15.5)
WBC Count: 73.3 10*3/uL (ref 4.0–10.5)
nRBC: 0 % (ref 0.0–0.2)

## 2020-05-18 LAB — CMP (CANCER CENTER ONLY)
ALT: 12 U/L (ref 0–44)
AST: 14 U/L — ABNORMAL LOW (ref 15–41)
Albumin: 4.4 g/dL (ref 3.5–5.0)
Alkaline Phosphatase: 57 U/L (ref 38–126)
Anion gap: 5 (ref 5–15)
BUN: 24 mg/dL — ABNORMAL HIGH (ref 8–23)
CO2: 30 mmol/L (ref 22–32)
Calcium: 9.5 mg/dL (ref 8.9–10.3)
Chloride: 104 mmol/L (ref 98–111)
Creatinine: 1.45 mg/dL — ABNORMAL HIGH (ref 0.61–1.24)
GFR, Est AFR Am: 55 mL/min — ABNORMAL LOW (ref >60)
GFR, Estimated: 47 mL/min — ABNORMAL LOW (ref >60)
Glucose, Bld: 94 mg/dL (ref 70–99)
Potassium: 4.6 mmol/L (ref 3.5–5.1)
Sodium: 139 mmol/L (ref 135–145)
Total Bilirubin: 0.4 mg/dL (ref 0.3–1.2)
Total Protein: 6.2 g/dL — ABNORMAL LOW (ref 6.5–8.1)

## 2020-05-18 LAB — LACTATE DEHYDROGENASE: LDH: 276 U/L — ABNORMAL HIGH (ref 98–192)

## 2020-05-18 LAB — SAVE SMEAR(SSMR), FOR PROVIDER SLIDE REVIEW

## 2020-05-18 NOTE — Telephone Encounter (Signed)
Richardson Landry from lab brought a panic WBC count of 73.3 to me. Results given to MD.

## 2020-05-18 NOTE — Progress Notes (Signed)
Hematology and Oncology Follow Up Visit  [name] [id number] 1946-07-24 74 y.o. 05/18/2020   Principle Diagnosis:   Right lower lobe pulmonary nodule- non-malignant  Recurrent pulmonary embolism  CLL -- progressive -- 11q-/13q-  Current Therapy:    Xarelto 20 mg by mouth daily  Calquence 100 mg po BID -- start on 03/13/2020  Venetoclax 400 mg po q day -- start in August or September 2021       Rituxan/bendamustine-s/p cycle #2 - given 06/18/2017      Interim History:  Mr.  [name] is back for follow-up.  He is doing okay.  He feels okay.  He does have occasions of being tired.  He feels little bit bloated this morning.  He had a very nice July 4 weekend.  He is still enjoying his new dog.  His pup is keeping quite busy.  He is having no problems with the Calquence.  His white cell count is still on the higher side.  He has been on the [place] now for 2 months.  We does have to be patient and allow the white cell count to come back down before we start the venetoclax.  He has had no problems with bowels or bladder.  He has had no diarrhea.  He has had no rashes.  There is been no chest wall pain.  He is noted that the lymph nodes have not grown any.  His appetite has been pretty good.   Overall, I would say his performance status is ECOG 1.    Medications:  Current Outpatient Medications:  .  acalabrutinib (CALQUENCE) 100 MG capsule, Take 1 capsule (100 mg total) by mouth 2 (two) times daily., Disp: 60 capsule, Rfl: 6 .  cholecalciferol (VITAMIN D) 1000 units tablet, Take 1,000 Units by mouth daily., Disp: , Rfl:  .  hydrochlorothiazide (HYDRODIURIL) 12.5 MG tablet, TAKE 1 TABLET BY MOUTH EVERY DAY, Disp: 90 tablet, Rfl: 1 .  LORazepam (ATIVAN) 0.5 MG tablet, TAKE 1 TABLET BY MOUTH EVERY DAY AS NEEDED FOR ANXIETY, Disp: 30 tablet, Rfl: 2 .  meclizine (ANTIVERT) 12.5 MG tablet, TAKE 1 TABLET EVERY DAY AS NEEDED FOR DIZZINESS, Disp: 30 tablet, Rfl: 2 .  rosuvastatin  (CRESTOR) 10 MG tablet, TAKE 1 TABLET BY MOUTH EVERY DAY, Disp: 90 tablet, Rfl: 3 .  sertraline (ZOLOFT) 100 MG tablet, Take 100 mg by mouth daily., Disp: , Rfl:  .  warfarin (COUMADIN) 5 MG tablet, TAKE AS DIRECTED BY COUMADIN CLINIC (Patient taking differently: TAKE AS DIRECTED BY COUMADIN CLINIC 05/18/2020 10mg  daily except Sunday  and Thursday then takes 5mg ), Disp: 195 tablet, Rfl: 1 .  amoxicillin (AMOXIL) 500 MG capsule, Take 500 mg by mouth 2 (two) times daily. (Patient not taking: Reported on 05/18/2020), Disp: , Rfl:   Allergies:  Allergies  Allergen Reactions  . Hydrocodone-Acetaminophen Shortness Of Breath  . Oxycodone Hcl Shortness Of Breath  . Telmisartan-Hctz Shortness Of Breath and Palpitations    Dizziness, too  . Ramipril   . Aspirin Other (See Comments) and Rash    Other Reaction: confusion Can't take high dose  . Atorvastatin Other (See Comments)    myalgia  . Buspirone Hcl Other (See Comments)    headache  . Codeine Nausea Only and Other (See Comments)    Other Reaction: confusion  . Codeine Phosphate Itching and Nausea Only  . Morphine Sulfate Itching  . Paroxetine Other (See Comments)    Paresthesias: R arm, R leg, r side of face  .  Rabeprazole Other (See Comments)    headache    Past Medical History, Surgical history, Social history, and Family History were reviewed and updated.  Review of Systems: Review of Systems  Constitutional: Negative.   HENT: Negative.   Eyes: Negative.   Respiratory: Negative.   Cardiovascular: Negative.   Gastrointestinal: Negative.   Genitourinary: Negative.   Musculoskeletal: Negative.   Skin: Negative.   Neurological: Negative.   Endo/Heme/Allergies: Negative.   Psychiatric/Behavioral: Negative.     Physical Exam:  weight is 196 lb 1.9 oz (89 kg). His oral temperature is 96.2 F (35.7 C) (abnormal). His blood pressure is 117/65 and his pulse is 62. His respiration is 20 and oxygen saturation is 99%.   Physical  Exam Vitals reviewed.  Constitutional:      Comments: Axillary exam does not show any palpable adenopathy in either axilla. Marland Kitchen    HENT:     Head: Normocephalic and atraumatic.  Eyes:     Pupils: Pupils are equal, round, and reactive to light.  Neck:     Comments: On his neck exam, he has had nice progression of his lymphadenopathy.  I cannot palpate any adenopathy in the cervical or supraclavicular chains. Cardiovascular:     Rate and Rhythm: Normal rate and regular rhythm.     Heart sounds: Normal heart sounds.  Pulmonary:     Effort: Pulmonary effort is normal.     Breath sounds: Normal breath sounds.  Abdominal:     General: Bowel sounds are normal.     Palpations: Abdomen is soft.  Musculoskeletal:        General: No tenderness or deformity. Normal range of motion.     Cervical back: Normal range of motion.  Lymphadenopathy:     Cervical: No cervical adenopathy.  Skin:    General: Skin is warm and dry.     Findings: No erythema or rash.  Neurological:     Mental Status: He is alert and oriented to person, place, and time.  Psychiatric:        Behavior: Behavior normal.        Thought Content: Thought content normal.        Judgment: Judgment normal.     Lab Results  Component Value Date   WBC 73.3 (HH) 05/18/2020   HGB 11.9 (L) 05/18/2020   HCT 37.0 (L) 05/18/2020   MCV 103.1 (H) 05/18/2020   PLT 108 (L) 05/18/2020     Chemistry      Component Value Date/Time   NA 139 05/18/2020 0825   NA 145 07/22/2017 0756   NA 139 06/27/2016 1410   K 4.6 05/18/2020 0825   K 4.3 07/22/2017 0756   K 5.1 06/27/2016 1410   CL 104 05/18/2020 0825   CL 105 07/22/2017 0756   CO2 30 05/18/2020 0825   CO2 30 07/22/2017 0756   CO2 28 06/27/2016 1410   BUN 24 (H) 05/18/2020 0825   BUN 17 07/22/2017 0756   BUN 20.9 06/27/2016 1410   CREATININE 1.45 (H) 05/18/2020 0825   CREATININE 1.6 (H) 07/22/2017 0756   CREATININE 1.6 (H) 06/27/2016 1410      Component Value Date/Time    CALCIUM 9.5 05/18/2020 0825   CALCIUM 9.6 07/22/2017 0756   CALCIUM 9.6 06/27/2016 1410   ALKPHOS 57 05/18/2020 0825   ALKPHOS 59 07/22/2017 0756   ALKPHOS 62 06/27/2016 1410   AST 14 (L) 05/18/2020 0825   AST 18 06/27/2016 1410   ALT 12 05/18/2020  0825   ALT 24 07/22/2017 0756   ALT 24 06/27/2016 1410   BILITOT 0.4 05/18/2020 0825   BILITOT 0.46 06/27/2016 1410      Impression and Plan: Mr. Morales is 74 year-old African-American gentleman.  He has recurrent CLL.  We started him on acalabrutinib a couple weeks ago.  He is doing okay with this.  Of note, because he is affiliated with the [place] system, he is to see a hematologist with the [place].  I think this is probably one of the hematologist at [facility] [address]  I feel very good about this.  Again, I would not start the venetoclax on him until his white cell count gets below 20,000.  I would like to think that we will see his lymph nodes start going down.  We will plan to get him back in another 6 weeks.  At that time, hopefully we will be able to consider starting the venetoclax.            Marland KitchenVolanda Napoleon, MD 7/8/20219:17 AM

## 2020-05-18 NOTE — Telephone Encounter (Signed)
Appointments scheduled calendar printed per 7/8 los 

## 2020-05-19 ENCOUNTER — Encounter: Payer: Self-pay | Admitting: Hematology & Oncology

## 2020-05-20 ENCOUNTER — Other Ambulatory Visit: Payer: Self-pay | Admitting: Family Medicine

## 2020-06-01 ENCOUNTER — Ambulatory Visit: Payer: Medicare Other | Admitting: *Deleted

## 2020-06-01 ENCOUNTER — Other Ambulatory Visit: Payer: Self-pay

## 2020-06-01 DIAGNOSIS — Z952 Presence of prosthetic heart valve: Secondary | ICD-10-CM

## 2020-06-01 DIAGNOSIS — Z5181 Encounter for therapeutic drug level monitoring: Secondary | ICD-10-CM

## 2020-06-01 DIAGNOSIS — I480 Paroxysmal atrial fibrillation: Secondary | ICD-10-CM

## 2020-06-01 DIAGNOSIS — I2699 Other pulmonary embolism without acute cor pulmonale: Secondary | ICD-10-CM | POA: Diagnosis not present

## 2020-06-01 LAB — POCT INR: INR: 3.3 — AB (ref 2.0–3.0)

## 2020-06-01 NOTE — Patient Instructions (Signed)
Description   Today take 1/2 tablet then start taking 2 tablets daily except 1 tablet on Sundays, Tuesdays, and Thursdays.  Recheck INR in 3 weeks. Please call with any questions or concerns please call Coumadin Clinic [phone] Main [phone]

## 2020-06-02 ENCOUNTER — Encounter: Payer: Self-pay | Admitting: Hematology & Oncology

## 2020-06-15 ENCOUNTER — Ambulatory Visit (INDEPENDENT_AMBULATORY_CARE_PROVIDER_SITE_OTHER): Payer: Medicare Other | Admitting: Psychology

## 2020-06-15 DIAGNOSIS — F4321 Adjustment disorder with depressed mood: Secondary | ICD-10-CM

## 2020-06-21 ENCOUNTER — Other Ambulatory Visit: Payer: Self-pay

## 2020-06-21 ENCOUNTER — Ambulatory Visit (HOSPITAL_COMMUNITY): Payer: Medicare Other | Attending: Cardiovascular Disease

## 2020-06-21 ENCOUNTER — Ambulatory Visit (INDEPENDENT_AMBULATORY_CARE_PROVIDER_SITE_OTHER): Payer: Medicare Other | Admitting: Pharmacist

## 2020-06-21 DIAGNOSIS — Z952 Presence of prosthetic heart valve: Secondary | ICD-10-CM

## 2020-06-21 DIAGNOSIS — I480 Paroxysmal atrial fibrillation: Secondary | ICD-10-CM

## 2020-06-21 DIAGNOSIS — Z5181 Encounter for therapeutic drug level monitoring: Secondary | ICD-10-CM

## 2020-06-21 DIAGNOSIS — I2699 Other pulmonary embolism without acute cor pulmonale: Secondary | ICD-10-CM

## 2020-06-21 LAB — ECHOCARDIOGRAM COMPLETE
AV Mean grad: 27 mmHg
AV Peak grad: 46.8 mmHg
Ao pk vel: 3.42 m/s
Area-P 1/2: 4.68 cm2
S' Lateral: 3.7 cm

## 2020-06-21 LAB — POCT INR: INR: 2.1 (ref 2.0–3.0)

## 2020-06-21 NOTE — Patient Instructions (Signed)
Description   Take 2 tablets today, then start taking 1.5 tablets daily except 2 tablets on Mondays and Fridays. Recheck INR in 2 weeks. Please call with any questions or concerns please call Coumadin Clinic [phone] Main [phone]

## 2020-06-29 ENCOUNTER — Telehealth: Payer: Self-pay | Admitting: *Deleted

## 2020-06-29 ENCOUNTER — Encounter: Payer: Self-pay | Admitting: Hematology & Oncology

## 2020-06-29 ENCOUNTER — Other Ambulatory Visit: Payer: Self-pay

## 2020-06-29 ENCOUNTER — Inpatient Hospital Stay: Payer: No Typology Code available for payment source | Attending: Hematology & Oncology

## 2020-06-29 ENCOUNTER — Inpatient Hospital Stay (HOSPITAL_BASED_OUTPATIENT_CLINIC_OR_DEPARTMENT_OTHER): Payer: No Typology Code available for payment source | Admitting: Hematology & Oncology

## 2020-06-29 VITALS — BP 121/73 | HR 63 | Temp 97.9°F | Resp 18 | Wt 199.2 lb

## 2020-06-29 DIAGNOSIS — Z7901 Long term (current) use of anticoagulants: Secondary | ICD-10-CM | POA: Insufficient documentation

## 2020-06-29 DIAGNOSIS — R918 Other nonspecific abnormal finding of lung field: Secondary | ICD-10-CM | POA: Diagnosis not present

## 2020-06-29 DIAGNOSIS — E883 Tumor lysis syndrome: Secondary | ICD-10-CM | POA: Diagnosis not present

## 2020-06-29 DIAGNOSIS — C9110 Chronic lymphocytic leukemia of B-cell type not having achieved remission: Secondary | ICD-10-CM | POA: Insufficient documentation

## 2020-06-29 DIAGNOSIS — I2699 Other pulmonary embolism without acute cor pulmonale: Secondary | ICD-10-CM | POA: Diagnosis not present

## 2020-06-29 DIAGNOSIS — Z79899 Other long term (current) drug therapy: Secondary | ICD-10-CM | POA: Insufficient documentation

## 2020-06-29 LAB — CMP (CANCER CENTER ONLY)
ALT: 11 U/L (ref 0–44)
AST: 15 U/L (ref 15–41)
Albumin: 4.3 g/dL (ref 3.5–5.0)
Alkaline Phosphatase: 52 U/L (ref 38–126)
Anion gap: 6 (ref 5–15)
BUN: 26 mg/dL — ABNORMAL HIGH (ref 8–23)
CO2: 29 mmol/L (ref 22–32)
Calcium: 9.7 mg/dL (ref 8.9–10.3)
Chloride: 103 mmol/L (ref 98–111)
Creatinine: 1.57 mg/dL — ABNORMAL HIGH (ref 0.61–1.24)
GFR, Est AFR Am: 50 mL/min — ABNORMAL LOW (ref >60)
GFR, Estimated: 43 mL/min — ABNORMAL LOW (ref >60)
Glucose, Bld: 99 mg/dL (ref 70–99)
Potassium: 4.5 mmol/L (ref 3.5–5.1)
Sodium: 138 mmol/L (ref 135–145)
Total Bilirubin: 0.4 mg/dL (ref 0.3–1.2)
Total Protein: 6.3 g/dL — ABNORMAL LOW (ref 6.5–8.1)

## 2020-06-29 LAB — CBC WITH DIFFERENTIAL (CANCER CENTER ONLY)
Abs Immature Granulocytes: 0.05 10*3/uL (ref 0.00–0.07)
Basophils Absolute: 0 10*3/uL (ref 0.0–0.1)
Basophils Relative: 0 %
Eosinophils Absolute: 0 10*3/uL (ref 0.0–0.5)
Eosinophils Relative: 0 %
HCT: 36.3 % — ABNORMAL LOW (ref 39.0–52.0)
Hemoglobin: 11.6 g/dL — ABNORMAL LOW (ref 13.0–17.0)
Immature Granulocytes: 0 %
Lymphocytes Relative: 97 %
Lymphs Abs: 79.1 10*3/uL — ABNORMAL HIGH (ref 0.7–4.0)
MCH: 33.2 pg (ref 26.0–34.0)
MCHC: 32 g/dL (ref 30.0–36.0)
MCV: 104 fL — ABNORMAL HIGH (ref 80.0–100.0)
Monocytes Absolute: 0.7 10*3/uL (ref 0.1–1.0)
Monocytes Relative: 1 %
Neutro Abs: 1.2 10*3/uL — ABNORMAL LOW (ref 1.7–7.7)
Neutrophils Relative %: 2 %
Platelet Count: 114 10*3/uL — ABNORMAL LOW (ref 150–400)
RBC: 3.49 MIL/uL — ABNORMAL LOW (ref 4.22–5.81)
RDW: 13.5 % (ref 11.5–15.5)
WBC Count: 81.1 10*3/uL (ref 4.0–10.5)
nRBC: 0 % (ref 0.0–0.2)

## 2020-06-29 LAB — LACTATE DEHYDROGENASE: LDH: 274 U/L — ABNORMAL HIGH (ref 98–192)

## 2020-06-29 LAB — SAVE SMEAR(SSMR), FOR PROVIDER SLIDE REVIEW

## 2020-06-29 MED ORDER — VENETOCLAX 10 & 50 & 100 MG PO TBPK: 50.0000 mg | ORAL_TABLET | Freq: Every day | ORAL | 3 refills | Status: DC

## 2020-06-29 MED ORDER — ALLOPURINOL 100 MG PO TABS: 100.0000 mg | ORAL_TABLET | Freq: Every day | ORAL | 2 refills | Status: DC

## 2020-06-29 NOTE — Telephone Encounter (Signed)
[name] notified of WBC-81.1.  No new orders received at this time.

## 2020-06-29 NOTE — Progress Notes (Signed)
Hematology and Oncology Follow Up Visit  [name] [id number] Nov 04, 1946 74 y.o. 06/29/2020   Principle Diagnosis:   Right lower lobe pulmonary nodule- non-malignant  Recurrent pulmonary embolism  CLL -- progressive -- 11q-/13q-  Current Therapy:    Xarelto 20 mg by mouth daily  Calquence 100 mg po BID -- start on 03/13/2020  Venetoclax 400 mg po q day -- start in August 2021       Interim History:  Mr.  Bell is back for follow-up.  I am incredibly disappointed that his white cell count continues to trend upward.  I am surprised by this.  He has been very diligent with taking the Calquence.  I do not see anything that would make the CLL resilient to Calquence.  We are going to have to get him on venetoclax.  I have to believe that venetoclax will overcome what ever resistance might be developing and get his CLL back into some type of remission.  I realize that his white cell count is high.  Unfortunately, we just have no choice but to start the venetoclax.  I talked to him about this.  He gets his medications from the [place].  I am sure that [name] we will be able to help [name] out with this.  We will start him off at a low dose and titrate up slowly.  I will get him on allopurinol at 100 mg p.o. daily.  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  I do not want to start anything until after the tournament.  His dog is growing.  I saw pictures of [name].  He really is a good looking bearded collie.  [name] has had no problems with bleeding.  There is no change in bowel or bladder habits.  He does have some of fatigue on occasion.  He has not noted any palpable lymph nodes.  He has had no cough or shortness of breath.  There has been no chest wall pain.  He is on Xarelto and doing well with this.  Overall, I would say his performance status is ECOG 1.    Medications:  Current Outpatient Medications:  .  acalabrutinib (CALQUENCE) 100 MG capsule, Take 1  capsule (100 mg total) by mouth 2 (two) times daily., Disp: 60 capsule, Rfl: 6 .  cholecalciferol (VITAMIN D) 1000 units tablet, Take 1,000 Units by mouth daily., Disp: , Rfl:  .  hydrochlorothiazide (HYDRODIURIL) 12.5 MG tablet, TAKE 1 TABLET BY MOUTH EVERY DAY, Disp: 90 tablet, Rfl: 1 .  LORazepam (ATIVAN) 0.5 MG tablet, TAKE 1 TABLET BY MOUTH EVERY DAY AS NEEDED FOR ANXIETY, Disp: 30 tablet, Rfl: 2 .  meclizine (ANTIVERT) 12.5 MG tablet, TAKE 1 TABLET EVERY DAY AS NEEDED FOR DIZZINESS, Disp: 30 tablet, Rfl: 2 .  rosuvastatin (CRESTOR) 10 MG tablet, TAKE 1 TABLET BY MOUTH EVERY DAY, Disp: 90 tablet, Rfl: 3 .  sertraline (ZOLOFT) 100 MG tablet, Take 100 mg by mouth daily., Disp: , Rfl:  .  warfarin (COUMADIN) 5 MG tablet, TAKE AS DIRECTED BY COUMADIN CLINIC (Patient taking differently: TAKE AS DIRECTED BY COUMADIN CLINIC 05/18/2020 10mg  daily except Sunday  and Thursday then takes 5mg ), Disp: 195 tablet, Rfl: 1 .  amoxicillin (AMOXIL) 500 MG capsule, Take 500 mg by mouth 2 (two) times daily. (Patient not taking: Reported on 05/18/2020), Disp: , Rfl:   Allergies:  Allergies  Allergen Reactions  . Hydrocodone-Acetaminophen Shortness Of Breath  . Oxycodone Hcl Shortness Of  Breath  . Telmisartan-Hctz Shortness Of Breath and Palpitations    Dizziness, too  . Ramipril   . Aspirin Other (See Comments) and Rash    Other Reaction: confusion Can't take high dose  . Atorvastatin Other (See Comments)    myalgia  . Buspirone Hcl Other (See Comments)    headache  . Codeine Nausea Only and Other (See Comments)    Other Reaction: confusion  . Codeine Phosphate Itching and Nausea Only  . Morphine Sulfate Itching  . Paroxetine Other (See Comments)    Paresthesias: R arm, R leg, r side of face  . Rabeprazole Other (See Comments)    headache    Past Medical History, Surgical history, Social history, and Family History were reviewed and updated.  Review of Systems: Review of Systems  Constitutional:  Negative.   HENT: Negative.   Eyes: Negative.   Respiratory: Negative.   Cardiovascular: Negative.   Gastrointestinal: Negative.   Genitourinary: Negative.   Musculoskeletal: Negative.   Skin: Negative.   Neurological: Negative.   Endo/Heme/Allergies: Negative.   Psychiatric/Behavioral: Negative.     Physical Exam:  weight is 199 lb 4 oz (90.4 kg). His oral temperature is 97.9 F (36.6 C). His blood pressure is 121/73 and his pulse is 63. His respiration is 18 and oxygen saturation is 100%.   Physical Exam Vitals reviewed.  Constitutional:      Comments: Axillary exam does not show any palpable adenopathy in either axilla. Marland Kitchen    HENT:     Head: Normocephalic and atraumatic.  Eyes:     Pupils: Pupils are equal, round, and reactive to light.  Neck:     Comments: On his neck exam, he has had nice regression of his lymphadenopathy.  I cannot palpate any adenopathy in the cervical or supraclavicular chains. Cardiovascular:     Rate and Rhythm: Normal rate and regular rhythm.     Heart sounds: Normal heart sounds.  Pulmonary:     Effort: Pulmonary effort is normal.     Breath sounds: Normal breath sounds.  Abdominal:     General: Bowel sounds are normal.     Palpations: Abdomen is soft.  Musculoskeletal:        General: No tenderness or deformity. Normal range of motion.     Cervical back: Normal range of motion.  Lymphadenopathy:     Cervical: No cervical adenopathy.  Skin:    General: Skin is warm and dry.     Findings: No erythema or rash.  Neurological:     Mental Status: He is alert and oriented to person, place, and time.  Psychiatric:        Behavior: Behavior normal.        Thought Content: Thought content normal.        Judgment: Judgment normal.     Lab Results  Component Value Date   WBC 81.1 (HH) 06/29/2020   HGB 11.6 (L) 06/29/2020   HCT 36.3 (L) 06/29/2020   MCV 104.0 (H) 06/29/2020   PLT 114 (L) 06/29/2020     Chemistry      Component Value  Date/Time   NA 138 06/29/2020 1022   NA 145 07/22/2017 0756   NA 139 06/27/2016 1410   K 4.5 06/29/2020 1022   K 4.3 07/22/2017 0756   K 5.1 06/27/2016 1410   CL 103 06/29/2020 1022   CL 105 07/22/2017 0756   CO2 29 06/29/2020 1022   CO2 30 07/22/2017 0756   CO2 28  06/27/2016 1410   BUN 26 (H) 06/29/2020 1022   BUN 17 07/22/2017 0756   BUN 20.9 06/27/2016 1410   CREATININE 1.57 (H) 06/29/2020 1022   CREATININE 1.6 (H) 07/22/2017 0756   CREATININE 1.6 (H) 06/27/2016 1410      Component Value Date/Time   CALCIUM 9.7 06/29/2020 1022   CALCIUM 9.6 07/22/2017 0756   CALCIUM 9.6 06/27/2016 1410   ALKPHOS 52 06/29/2020 1022   ALKPHOS 59 07/22/2017 0756   ALKPHOS 62 06/27/2016 1410   AST 15 06/29/2020 1022   AST 18 06/27/2016 1410   ALT 11 06/29/2020 1022   ALT 24 07/22/2017 0756   ALT 24 06/27/2016 1410   BILITOT 0.4 06/29/2020 1022   BILITOT 0.46 06/27/2016 1410      Impression and Plan: [name] is 74 year-old African-American gentleman.  He has recurrent CLL.  We started him on acalabrutinib a couple weeks ago.  He is doing okay with this.  I just hate the fact that we are not seeing a good response with the [place].  Again, I think venetoclax will help.  We will have to be careful with tumor lysis.  We will get him on allopurinol.  Told him that we have to monitor his blood work weekly.  We really need to make sure that his uric acid and renal function are not deteriorating.  This is complicated.  I spent about 40 minutes with [name].  We went over the venetoclax and some of the side effects.  We will have to see about the [facility] providing venetoclax for him.  I would like to see him back in 1 month myself.  If, for some reason we do not see his CLL responding, we may actually have to go after a lymph node and try to take 1 out and analyze it.  We may have to also consider aggressive chemotherapy with R-CHOP or something similar.              Marland KitchenVolanda Napoleon, MD 8/19/20215:02 PM

## 2020-06-30 ENCOUNTER — Encounter: Payer: Self-pay | Admitting: *Deleted

## 2020-06-30 ENCOUNTER — Telehealth: Payer: Self-pay | Admitting: Pharmacist

## 2020-06-30 ENCOUNTER — Other Ambulatory Visit: Payer: Self-pay | Admitting: *Deleted

## 2020-06-30 ENCOUNTER — Telehealth: Payer: Self-pay | Admitting: Pharmacy Technician

## 2020-06-30 ENCOUNTER — Telehealth: Payer: Self-pay | Admitting: Hematology & Oncology

## 2020-06-30 DIAGNOSIS — C9110 Chronic lymphocytic leukemia of B-cell type not having achieved remission: Secondary | ICD-10-CM

## 2020-06-30 MED ORDER — VENETOCLAX 10 & 50 & 100 MG PO TBPK: ORAL_TABLET | ORAL | 0 refills | Status: AC

## 2020-06-30 NOTE — Telephone Encounter (Signed)
Oral Oncology Pharmacy Student Encounter  Received new prescription for Venclexta (venetoclax) for the treatment of recurrent CLL, planned duration until disease control or unacceptable toxicity.  Labs from 06/29/2020 assessed:  -Lymphocyte count 79,100 (trending up), suggesting high tumor burden -Scr 1.57 (eGFR 50), reduced renal function -K 4.5, WNL -Ca 9.7, WNL -Uric acid 6.5, WNL  Prescription dose and frequency assessed. Ramp up schedule as follows: Week 1 - 20 mg PO daily Week 2 - 50 mg PO daily Week 3 - 100 mg PO daily Week 4 - 200 mg PO daily Week 5 & beyond - 400 mg PO daily  Patient will also be starting on allopurinol for TLS prophylaxis.  Current medication list in Epic reviewed, 1 DDI with venetoclax identified: -Warfarin concentrations may be increased by venetoclax  Evaluated chart and no patient barriers to medication adherence identified.   Prescription redirected to [facility] where the patient fills his medication.  Oral Oncology Clinic will continue to follow for insurance authorization, copayment issues, initial counseling and start date.  Esmeralda Links, PharmD Candidate [facility] Oral Chemotherapy Navigation Clinic [phone]  06/30/2020 9:45 AM

## 2020-06-30 NOTE — Telephone Encounter (Signed)
Oral Oncology Patient Advocate Encounter  Received notification from [employer] that prior authorization for Venclexta is required.  PA submitted on CoverMyMeds Key [id number]  Status is pending  Oral Oncology Clinic will continue to follow.  [place] Patient [place] Phone [phone] Fax [phone] 06/30/2020 11:09 AM

## 2020-06-30 NOTE — Telephone Encounter (Signed)
Oral Chemotherapy Pharmacist Encounter  Patient knows his venetoclax prescription was sent to the [place]. He plans on following up with the [facility] next weeks.  Patient Education I spoke with patient for overview of new oral chemotherapy medication: Venclexta (venetoclax) for the treatment of recurrent CLL, planned duration until disease control or unacceptable toxicity.   Pt is doing well. Counseled patient on administration, dosing, side effects, monitoring, drug-food interactions, safe handling, storage, and disposal. Patient will take 20 mg by mouth daily for 7 days, THEN 50 mg daily for 7 days, THEN 100 mg daily for 7 days, THEN 200 mg daily for 7 days. Take with food and water.  Side effects include but not limited to: N/V, fatigue, risk of TLS, decreased wbc.    Discussed the importance of hydration and lab monitoring with the risk of TLS. Patient has already started his allopurinol and know knows that is part of the TLS prophylaxis plan.  Reviewed with patient importance of keeping a medication schedule and plan for any missed doses.  After discussion with patient no patient barriers to medication adherence identified.   Mr. Pellicane voiced understanding and appreciation. All questions answered. Medication handout placed in the mail.  Provided patient with [facility] phone number. Patient knows to call the office with questions or concerns. Oral Chemotherapy Navigation Clinic will continue to follow.  Darl Pikes, PharmD, BCPS, BCOP, CPP Hematology/Oncology Clinical Pharmacist Practitioner [facility] [phone]  06/30/2020 4:07 PM

## 2020-06-30 NOTE — Telephone Encounter (Signed)
Oral Oncology Patient Advocate Encounter  Prior Authorization for [name] has been approved.    PA# [id number] Effective dates: 06/30/20 through 11/10/20.  Patients co-pay is $267.32  [facility].  Oral Oncology Clinic will continue to follow.   [name] Phone [phone] Fax [phone] 06/30/2020 11:17 AM

## 2020-06-30 NOTE — Telephone Encounter (Signed)
Appointments scheduled calendar printed & mailed per 8/19 los 

## 2020-06-30 NOTE — Telephone Encounter (Signed)
Oral Chemotherapy Pharmacist Encounter   Message Cardiology pharmacy team managing the patient's warfarin to let the know about the DDI with venetoclax.  Phos labs added to patient's TLS monitoring plan.  Darl Pikes, PharmD, BCPS, BCOP, CPP Hematology/Oncology Clinical Pharmacist [facility] [phone]  06/30/2020 4:04 PM

## 2020-07-03 NOTE — Telephone Encounter (Signed)
Oral Chemotherapy Pharmacist Encounter   Patient called today to let me know that [facility] was [facility] today.   Darl Pikes, PharmD, BCPS, BCOP, CPP Hematology/Oncology Clinical Pharmacist [facility] [place] [facility] [phone]  07/03/2020 1:45 PM

## 2020-07-05 ENCOUNTER — Other Ambulatory Visit: Payer: Self-pay

## 2020-07-05 ENCOUNTER — Ambulatory Visit (INDEPENDENT_AMBULATORY_CARE_PROVIDER_SITE_OTHER): Payer: No Typology Code available for payment source | Admitting: *Deleted

## 2020-07-05 DIAGNOSIS — I2699 Other pulmonary embolism without acute cor pulmonale: Secondary | ICD-10-CM | POA: Diagnosis not present

## 2020-07-05 DIAGNOSIS — Z952 Presence of prosthetic heart valve: Secondary | ICD-10-CM | POA: Diagnosis not present

## 2020-07-05 DIAGNOSIS — Z5181 Encounter for therapeutic drug level monitoring: Secondary | ICD-10-CM

## 2020-07-05 DIAGNOSIS — I480 Paroxysmal atrial fibrillation: Secondary | ICD-10-CM

## 2020-07-05 LAB — POCT INR: INR: 2.5 (ref 2.0–3.0)

## 2020-07-05 NOTE — Patient Instructions (Addendum)
Description   Continue taking 1.5 tablets daily except 2 tablets on Mondays and Fridays. Recheck INR in 3 weeks. Please call with any questions or concerns please call Coumadin Clinic [phone] Main [phone]

## 2020-07-08 ENCOUNTER — Encounter: Payer: Self-pay | Admitting: Hematology & Oncology

## 2020-07-12 ENCOUNTER — Other Ambulatory Visit: Payer: Self-pay

## 2020-07-12 ENCOUNTER — Telehealth: Payer: Self-pay

## 2020-07-12 ENCOUNTER — Inpatient Hospital Stay: Payer: No Typology Code available for payment source | Attending: Hematology & Oncology

## 2020-07-12 DIAGNOSIS — Z7901 Long term (current) use of anticoagulants: Secondary | ICD-10-CM | POA: Diagnosis not present

## 2020-07-12 DIAGNOSIS — Z79899 Other long term (current) drug therapy: Secondary | ICD-10-CM | POA: Insufficient documentation

## 2020-07-12 DIAGNOSIS — I2699 Other pulmonary embolism without acute cor pulmonale: Secondary | ICD-10-CM | POA: Diagnosis not present

## 2020-07-12 DIAGNOSIS — C9110 Chronic lymphocytic leukemia of B-cell type not having achieved remission: Secondary | ICD-10-CM

## 2020-07-12 DIAGNOSIS — R911 Solitary pulmonary nodule: Secondary | ICD-10-CM | POA: Insufficient documentation

## 2020-07-12 LAB — CMP (CANCER CENTER ONLY)
ALT: 12 U/L (ref 0–44)
AST: 15 U/L (ref 15–41)
Albumin: 4.3 g/dL (ref 3.5–5.0)
Alkaline Phosphatase: 53 U/L (ref 38–126)
Anion gap: 6 (ref 5–15)
BUN: 20 mg/dL (ref 8–23)
CO2: 30 mmol/L (ref 22–32)
Calcium: 9.6 mg/dL (ref 8.9–10.3)
Chloride: 104 mmol/L (ref 98–111)
Creatinine: 1.52 mg/dL — ABNORMAL HIGH (ref 0.61–1.24)
GFR, Est AFR Am: 52 mL/min — ABNORMAL LOW (ref >60)
GFR, Estimated: 44 mL/min — ABNORMAL LOW (ref >60)
Glucose, Bld: 78 mg/dL (ref 70–99)
Potassium: 4.4 mmol/L (ref 3.5–5.1)
Sodium: 140 mmol/L (ref 135–145)
Total Bilirubin: 0.4 mg/dL (ref 0.3–1.2)
Total Protein: 6.6 g/dL (ref 6.5–8.1)

## 2020-07-12 LAB — CBC WITH DIFFERENTIAL (CANCER CENTER ONLY)
Abs Immature Granulocytes: 0.04 10*3/uL (ref 0.00–0.07)
Basophils Absolute: 0 10*3/uL (ref 0.0–0.1)
Basophils Relative: 0 %
Eosinophils Absolute: 0.1 10*3/uL (ref 0.0–0.5)
Eosinophils Relative: 0 %
HCT: 37.7 % — ABNORMAL LOW (ref 39.0–52.0)
Hemoglobin: 12.1 g/dL — ABNORMAL LOW (ref 13.0–17.0)
Immature Granulocytes: 0 %
Lymphocytes Relative: 97 %
Lymphs Abs: 67.7 10*3/uL — ABNORMAL HIGH (ref 0.7–4.0)
MCH: 33.2 pg (ref 26.0–34.0)
MCHC: 32.1 g/dL (ref 30.0–36.0)
MCV: 103.3 fL — ABNORMAL HIGH (ref 80.0–100.0)
Monocytes Absolute: 0.3 10*3/uL (ref 0.1–1.0)
Monocytes Relative: 1 %
Neutro Abs: 1.3 10*3/uL — ABNORMAL LOW (ref 1.7–7.7)
Neutrophils Relative %: 2 %
Platelet Count: 114 10*3/uL — ABNORMAL LOW (ref 150–400)
RBC: 3.65 MIL/uL — ABNORMAL LOW (ref 4.22–5.81)
RDW: 13.2 % (ref 11.5–15.5)
WBC Count: 69.4 10*3/uL (ref 4.0–10.5)
nRBC: 0 % (ref 0.0–0.2)

## 2020-07-12 LAB — PROTIME-INR
INR: 2.6 — ABNORMAL HIGH (ref 0.8–1.2)
Prothrombin Time: 27.2 seconds — ABNORMAL HIGH (ref 11.4–15.2)

## 2020-07-12 LAB — URIC ACID: Uric Acid, Serum: 6.3 mg/dL (ref 3.7–8.6)

## 2020-07-12 LAB — PHOSPHORUS: Phosphorus: 3.4 mg/dL (ref 2.5–4.6)

## 2020-07-12 NOTE — Telephone Encounter (Signed)
DR [name] aware of Critical high WBC, improved since last reading. No new orders at this time. dph

## 2020-07-13 ENCOUNTER — Ambulatory Visit: Payer: Medicare Other | Admitting: Psychology

## 2020-07-16 ENCOUNTER — Other Ambulatory Visit: Payer: Self-pay | Admitting: Family Medicine

## 2020-07-17 ENCOUNTER — Encounter: Payer: Self-pay | Admitting: Hematology & Oncology

## 2020-07-19 ENCOUNTER — Other Ambulatory Visit: Payer: Self-pay

## 2020-07-19 ENCOUNTER — Encounter: Payer: Self-pay | Admitting: Family Medicine

## 2020-07-19 ENCOUNTER — Other Ambulatory Visit: Payer: Non-veteran care

## 2020-07-19 ENCOUNTER — Inpatient Hospital Stay: Payer: No Typology Code available for payment source

## 2020-07-19 DIAGNOSIS — C9110 Chronic lymphocytic leukemia of B-cell type not having achieved remission: Secondary | ICD-10-CM

## 2020-07-19 LAB — CBC WITH DIFFERENTIAL (CANCER CENTER ONLY)
Abs Immature Granulocytes: 0.05 10*3/uL (ref 0.00–0.07)
Basophils Absolute: 0 10*3/uL (ref 0.0–0.1)
Basophils Relative: 0 %
Eosinophils Absolute: 0.1 10*3/uL (ref 0.0–0.5)
Eosinophils Relative: 0 %
HCT: 36.7 % — ABNORMAL LOW (ref 39.0–52.0)
Hemoglobin: 11.6 g/dL — ABNORMAL LOW (ref 13.0–17.0)
Immature Granulocytes: 0 %
Lymphocytes Relative: 96 %
Lymphs Abs: 43.3 10*3/uL — ABNORMAL HIGH (ref 0.7–4.0)
MCH: 32.7 pg (ref 26.0–34.0)
MCHC: 31.6 g/dL (ref 30.0–36.0)
MCV: 103.4 fL — ABNORMAL HIGH (ref 80.0–100.0)
Monocytes Absolute: 0.3 10*3/uL (ref 0.1–1.0)
Monocytes Relative: 1 %
Neutro Abs: 1.4 10*3/uL — ABNORMAL LOW (ref 1.7–7.7)
Neutrophils Relative %: 3 %
Platelet Count: 122 10*3/uL — ABNORMAL LOW (ref 150–400)
RBC: 3.55 MIL/uL — ABNORMAL LOW (ref 4.22–5.81)
RDW: 13.1 % (ref 11.5–15.5)
WBC Count: 45.1 10*3/uL — ABNORMAL HIGH (ref 4.0–10.5)
nRBC: 0 % (ref 0.0–0.2)

## 2020-07-19 LAB — CMP (CANCER CENTER ONLY)
ALT: 12 U/L (ref 0–44)
AST: 16 U/L (ref 15–41)
Albumin: 4.2 g/dL (ref 3.5–5.0)
Alkaline Phosphatase: 49 U/L (ref 38–126)
Anion gap: 8 (ref 5–15)
BUN: 23 mg/dL (ref 8–23)
CO2: 28 mmol/L (ref 22–32)
Calcium: 9.4 mg/dL (ref 8.9–10.3)
Chloride: 103 mmol/L (ref 98–111)
Creatinine: 1.77 mg/dL — ABNORMAL HIGH (ref 0.61–1.24)
GFR, Est AFR Am: 43 mL/min — ABNORMAL LOW (ref >60)
GFR, Estimated: 37 mL/min — ABNORMAL LOW (ref >60)
Glucose, Bld: 94 mg/dL (ref 70–99)
Potassium: 4.4 mmol/L (ref 3.5–5.1)
Sodium: 139 mmol/L (ref 135–145)
Total Bilirubin: 0.3 mg/dL (ref 0.3–1.2)
Total Protein: 6.4 g/dL — ABNORMAL LOW (ref 6.5–8.1)

## 2020-07-19 LAB — PROTIME-INR
INR: 2.5 — ABNORMAL HIGH (ref 0.8–1.2)
Prothrombin Time: 26 seconds — ABNORMAL HIGH (ref 11.4–15.2)

## 2020-07-19 LAB — URIC ACID: Uric Acid, Serum: 7.1 mg/dL (ref 3.7–8.6)

## 2020-07-19 LAB — PHOSPHORUS: Phosphorus: 4 mg/dL (ref 2.5–4.6)

## 2020-07-19 MED ORDER — LORAZEPAM 0.5 MG PO TABS: 0.5000 mg | ORAL_TABLET | Freq: Every day | ORAL | 2 refills | Status: DC | PRN

## 2020-07-19 NOTE — Telephone Encounter (Signed)
LAST APPOINTMENT DATE:05/20/2020   NEXT APPOINTMENT DATE: 11/13/2020    LAST REFILL: 04/06/2020  QTY: #30 with 2 rf

## 2020-07-24 ENCOUNTER — Other Ambulatory Visit: Payer: Self-pay

## 2020-07-24 ENCOUNTER — Ambulatory Visit (INDEPENDENT_AMBULATORY_CARE_PROVIDER_SITE_OTHER): Payer: No Typology Code available for payment source | Admitting: Pharmacist

## 2020-07-24 DIAGNOSIS — Z5181 Encounter for therapeutic drug level monitoring: Secondary | ICD-10-CM | POA: Diagnosis not present

## 2020-07-24 DIAGNOSIS — I480 Paroxysmal atrial fibrillation: Secondary | ICD-10-CM

## 2020-07-24 DIAGNOSIS — I2699 Other pulmonary embolism without acute cor pulmonale: Secondary | ICD-10-CM

## 2020-07-24 DIAGNOSIS — Z952 Presence of prosthetic heart valve: Secondary | ICD-10-CM

## 2020-07-24 LAB — POCT INR: INR: 2.4 (ref 2.0–3.0)

## 2020-07-24 NOTE — Patient Instructions (Signed)
Description   Take 2.5 tablets today, then continue taking 1.5 tablets daily except 2 tablets on Mondays and Fridays. Recheck INR in 3 weeks. Please call with any questions or concerns please call Coumadin Clinic [phone] Main [phone]

## 2020-07-26 ENCOUNTER — Inpatient Hospital Stay: Payer: No Typology Code available for payment source

## 2020-07-26 ENCOUNTER — Other Ambulatory Visit: Payer: Self-pay

## 2020-07-26 DIAGNOSIS — C9110 Chronic lymphocytic leukemia of B-cell type not having achieved remission: Secondary | ICD-10-CM | POA: Diagnosis not present

## 2020-07-26 LAB — CBC WITH DIFFERENTIAL (CANCER CENTER ONLY)
Abs Immature Granulocytes: 0.05 10*3/uL (ref 0.00–0.07)
Basophils Absolute: 0.1 10*3/uL (ref 0.0–0.1)
Basophils Relative: 0 %
Eosinophils Absolute: 0 10*3/uL (ref 0.0–0.5)
Eosinophils Relative: 0 %
HCT: 36.3 % — ABNORMAL LOW (ref 39.0–52.0)
Hemoglobin: 11.6 g/dL — ABNORMAL LOW (ref 13.0–17.0)
Immature Granulocytes: 0 %
Lymphocytes Relative: 96 %
Lymphs Abs: 31.3 10*3/uL — ABNORMAL HIGH (ref 0.7–4.0)
MCH: 33.2 pg (ref 26.0–34.0)
MCHC: 32 g/dL (ref 30.0–36.0)
MCV: 104 fL — ABNORMAL HIGH (ref 80.0–100.0)
Monocytes Absolute: 0.3 10*3/uL (ref 0.1–1.0)
Monocytes Relative: 1 %
Neutro Abs: 1.1 10*3/uL — ABNORMAL LOW (ref 1.7–7.7)
Neutrophils Relative %: 3 %
Platelet Count: 116 10*3/uL — ABNORMAL LOW (ref 150–400)
RBC: 3.49 MIL/uL — ABNORMAL LOW (ref 4.22–5.81)
RDW: 13.1 % (ref 11.5–15.5)
WBC Count: 32.9 10*3/uL — ABNORMAL HIGH (ref 4.0–10.5)
nRBC: 0 % (ref 0.0–0.2)

## 2020-07-26 LAB — CMP (CANCER CENTER ONLY)
ALT: 11 U/L (ref 0–44)
AST: 15 U/L (ref 15–41)
Albumin: 4.1 g/dL (ref 3.5–5.0)
Alkaline Phosphatase: 52 U/L (ref 38–126)
Anion gap: 5 (ref 5–15)
BUN: 25 mg/dL — ABNORMAL HIGH (ref 8–23)
CO2: 29 mmol/L (ref 22–32)
Calcium: 9.5 mg/dL (ref 8.9–10.3)
Chloride: 104 mmol/L (ref 98–111)
Creatinine: 1.58 mg/dL — ABNORMAL HIGH (ref 0.61–1.24)
GFR, Est AFR Am: 49 mL/min — ABNORMAL LOW (ref >60)
GFR, Estimated: 42 mL/min — ABNORMAL LOW (ref >60)
Glucose, Bld: 97 mg/dL (ref 70–99)
Potassium: 4.7 mmol/L (ref 3.5–5.1)
Sodium: 138 mmol/L (ref 135–145)
Total Bilirubin: 0.3 mg/dL (ref 0.3–1.2)
Total Protein: 6 g/dL — ABNORMAL LOW (ref 6.5–8.1)

## 2020-07-26 LAB — URIC ACID: Uric Acid, Serum: 6.4 mg/dL (ref 3.7–8.6)

## 2020-07-26 LAB — PROTIME-INR
INR: 2.4 — ABNORMAL HIGH (ref 0.8–1.2)
Prothrombin Time: 25.2 seconds — ABNORMAL HIGH (ref 11.4–15.2)

## 2020-07-27 ENCOUNTER — Encounter: Payer: Self-pay | Admitting: Family Medicine

## 2020-07-31 ENCOUNTER — Encounter: Payer: Self-pay | Admitting: Hematology & Oncology

## 2020-07-31 ENCOUNTER — Other Ambulatory Visit: Payer: Self-pay | Admitting: *Deleted

## 2020-07-31 MED ORDER — VENETOCLAX 100 MG PO TABS: 200.0000 mg | ORAL_TABLET | Freq: Every day | ORAL | 3 refills | Status: DC

## 2020-08-02 ENCOUNTER — Other Ambulatory Visit: Payer: Self-pay

## 2020-08-02 ENCOUNTER — Other Ambulatory Visit: Payer: Self-pay | Admitting: *Deleted

## 2020-08-02 ENCOUNTER — Inpatient Hospital Stay: Payer: No Typology Code available for payment source

## 2020-08-02 DIAGNOSIS — C9110 Chronic lymphocytic leukemia of B-cell type not having achieved remission: Secondary | ICD-10-CM

## 2020-08-02 LAB — CBC WITH DIFFERENTIAL (CANCER CENTER ONLY)
Abs Immature Granulocytes: 0.06 10*3/uL (ref 0.00–0.07)
Basophils Absolute: 0 10*3/uL (ref 0.0–0.1)
Basophils Relative: 0 %
Eosinophils Absolute: 0 10*3/uL (ref 0.0–0.5)
Eosinophils Relative: 0 %
HCT: 37 % — ABNORMAL LOW (ref 39.0–52.0)
Hemoglobin: 12.2 g/dL — ABNORMAL LOW (ref 13.0–17.0)
Immature Granulocytes: 0 %
Lymphocytes Relative: 91 %
Lymphs Abs: 13.9 10*3/uL — ABNORMAL HIGH (ref 0.7–4.0)
MCH: 33.5 pg (ref 26.0–34.0)
MCHC: 33 g/dL (ref 30.0–36.0)
MCV: 101.6 fL — ABNORMAL HIGH (ref 80.0–100.0)
Monocytes Absolute: 0.3 10*3/uL (ref 0.1–1.0)
Monocytes Relative: 2 %
Neutro Abs: 1 10*3/uL — ABNORMAL LOW (ref 1.7–7.7)
Neutrophils Relative %: 7 %
Platelet Count: 120 10*3/uL — ABNORMAL LOW (ref 150–400)
RBC: 3.64 MIL/uL — ABNORMAL LOW (ref 4.22–5.81)
RDW: 13 % (ref 11.5–15.5)
WBC Count: 15.3 10*3/uL — ABNORMAL HIGH (ref 4.0–10.5)
nRBC: 0 % (ref 0.0–0.2)

## 2020-08-02 LAB — PROTIME-INR
INR: 2.8 — ABNORMAL HIGH (ref 0.8–1.2)
Prothrombin Time: 28.9 seconds — ABNORMAL HIGH (ref 11.4–15.2)

## 2020-08-02 LAB — CMP (CANCER CENTER ONLY)
ALT: 11 U/L (ref 0–44)
AST: 18 U/L (ref 15–41)
Albumin: 4.4 g/dL (ref 3.5–5.0)
Alkaline Phosphatase: 54 U/L (ref 38–126)
Anion gap: 7 (ref 5–15)
BUN: 24 mg/dL — ABNORMAL HIGH (ref 8–23)
CO2: 29 mmol/L (ref 22–32)
Calcium: 9.7 mg/dL (ref 8.9–10.3)
Chloride: 102 mmol/L (ref 98–111)
Creatinine: 1.59 mg/dL — ABNORMAL HIGH (ref 0.61–1.24)
GFR, Est AFR Am: 49 mL/min — ABNORMAL LOW (ref >60)
GFR, Estimated: 42 mL/min — ABNORMAL LOW (ref >60)
Glucose, Bld: 76 mg/dL (ref 70–99)
Potassium: 4.9 mmol/L (ref 3.5–5.1)
Sodium: 138 mmol/L (ref 135–145)
Total Bilirubin: 0.4 mg/dL (ref 0.3–1.2)
Total Protein: 6.7 g/dL (ref 6.5–8.1)

## 2020-08-02 LAB — LACTATE DEHYDROGENASE: LDH: 310 U/L — ABNORMAL HIGH (ref 98–192)

## 2020-08-02 LAB — SAVE SMEAR(SSMR), FOR PROVIDER SLIDE REVIEW

## 2020-08-10 ENCOUNTER — Inpatient Hospital Stay (HOSPITAL_BASED_OUTPATIENT_CLINIC_OR_DEPARTMENT_OTHER): Payer: No Typology Code available for payment source | Admitting: Hematology & Oncology

## 2020-08-10 ENCOUNTER — Telehealth: Payer: Self-pay | Admitting: Hematology & Oncology

## 2020-08-10 ENCOUNTER — Other Ambulatory Visit: Payer: Self-pay

## 2020-08-10 ENCOUNTER — Inpatient Hospital Stay: Payer: No Typology Code available for payment source

## 2020-08-10 ENCOUNTER — Ambulatory Visit (INDEPENDENT_AMBULATORY_CARE_PROVIDER_SITE_OTHER): Payer: Medicare Other | Admitting: Psychology

## 2020-08-10 VITALS — BP 135/84 | HR 62 | Temp 98.4°F | Resp 18 | Wt 200.0 lb

## 2020-08-10 DIAGNOSIS — C9110 Chronic lymphocytic leukemia of B-cell type not having achieved remission: Secondary | ICD-10-CM | POA: Diagnosis not present

## 2020-08-10 DIAGNOSIS — F4321 Adjustment disorder with depressed mood: Secondary | ICD-10-CM

## 2020-08-10 LAB — CMP (CANCER CENTER ONLY)
ALT: 12 U/L (ref 0–44)
AST: 16 U/L (ref 15–41)
Albumin: 4.3 g/dL (ref 3.5–5.0)
Alkaline Phosphatase: 44 U/L (ref 38–126)
Anion gap: 6 (ref 5–15)
BUN: 24 mg/dL — ABNORMAL HIGH (ref 8–23)
CO2: 30 mmol/L (ref 22–32)
Calcium: 9.5 mg/dL (ref 8.9–10.3)
Chloride: 102 mmol/L (ref 98–111)
Creatinine: 1.56 mg/dL — ABNORMAL HIGH (ref 0.61–1.24)
GFR, Est AFR Am: 50 mL/min — ABNORMAL LOW (ref >60)
GFR, Estimated: 43 mL/min — ABNORMAL LOW (ref >60)
Glucose, Bld: 93 mg/dL (ref 70–99)
Potassium: 4.3 mmol/L (ref 3.5–5.1)
Sodium: 138 mmol/L (ref 135–145)
Total Bilirubin: 0.4 mg/dL (ref 0.3–1.2)
Total Protein: 6.4 g/dL — ABNORMAL LOW (ref 6.5–8.1)

## 2020-08-10 LAB — URIC ACID: Uric Acid, Serum: 6.5 mg/dL (ref 3.7–8.6)

## 2020-08-10 LAB — CBC WITH DIFFERENTIAL (CANCER CENTER ONLY)
Abs Immature Granulocytes: 0.03 10*3/uL (ref 0.00–0.07)
Basophils Absolute: 0 10*3/uL (ref 0.0–0.1)
Basophils Relative: 0 %
Eosinophils Absolute: 0 10*3/uL (ref 0.0–0.5)
Eosinophils Relative: 0 %
HCT: 35.9 % — ABNORMAL LOW (ref 39.0–52.0)
Hemoglobin: 11.8 g/dL — ABNORMAL LOW (ref 13.0–17.0)
Immature Granulocytes: 0 %
Lymphocytes Relative: 91 %
Lymphs Abs: 9.2 10*3/uL — ABNORMAL HIGH (ref 0.7–4.0)
MCH: 33.5 pg (ref 26.0–34.0)
MCHC: 32.9 g/dL (ref 30.0–36.0)
MCV: 102 fL — ABNORMAL HIGH (ref 80.0–100.0)
Monocytes Absolute: 0.2 10*3/uL (ref 0.1–1.0)
Monocytes Relative: 2 %
Neutro Abs: 0.7 10*3/uL — ABNORMAL LOW (ref 1.7–7.7)
Neutrophils Relative %: 7 %
Platelet Count: 125 10*3/uL — ABNORMAL LOW (ref 150–400)
RBC: 3.52 MIL/uL — ABNORMAL LOW (ref 4.22–5.81)
RDW: 12.8 % (ref 11.5–15.5)
WBC Count: 10.2 10*3/uL (ref 4.0–10.5)
nRBC: 0 % (ref 0.0–0.2)

## 2020-08-10 LAB — PHOSPHORUS: Phosphorus: 3.2 mg/dL (ref 2.5–4.6)

## 2020-08-10 LAB — PROTIME-INR
INR: 3 — ABNORMAL HIGH (ref 0.8–1.2)
Prothrombin Time: 30.2 seconds — ABNORMAL HIGH (ref 11.4–15.2)

## 2020-08-10 NOTE — Telephone Encounter (Signed)
Appointments scheduled patient has My Chart Access per 9/30 los

## 2020-08-10 NOTE — Progress Notes (Signed)
Hematology and Oncology Follow Up Visit  [name] [id number] February 24, 1946 74 y.o. 08/10/2020   Principle Diagnosis:   Right lower lobe pulmonary nodule- non-malignant  Recurrent pulmonary embolism  CLL -- progressive -- 11q-/13q-  Current Therapy:    Coumadin 5 mg po q day  Calquence 100 mg po BID -- start on 03/13/2020  Venetoclax 200 mg po q day -- start in August 2021       Interim History:  [name] is back for follow-up.  So far, he is done quite well with the venetoclax added to the acalabrutinib.  His white cell count is coming down nicely.  The percentage of lymphocytes is still in the high side but I am sure this will eventually start to decline.  He has had no problems with the venetoclax/Calquence combination.  He has had no diarrhea.  He has had no fever.  He has had no rashes.  There has been no nausea or vomiting.  He has been quite active.  He exercises.  He does tai chi.  He has his dog that he walks all the time.  He is on Coumadin now.  He is not on Xarelto.  Cardiology is managing his INR.    Overall, I would say his performance status is ECOG 1.    Medications:  Current Outpatient Medications:  .  acalabrutinib (CALQUENCE) 100 MG capsule, Take 1 capsule (100 mg total) by mouth 2 (two) times daily., Disp: 60 capsule, Rfl: 6 .  allopurinol (ZYLOPRIM) 100 MG tablet, Take 1 tablet (100 mg total) by mouth daily., Disp: 30 tablet, Rfl: 2 .  amoxicillin (AMOXIL) 500 MG capsule, Take 500 mg by mouth 2 (two) times daily. Prior to dental procedures., Disp: , Rfl:  .  cholecalciferol (VITAMIN D) 1000 units tablet, Take 1,000 Units by mouth daily., Disp: , Rfl:  .  hydrochlorothiazide (HYDRODIURIL) 12.5 MG tablet, TAKE 1 TABLET BY MOUTH EVERY DAY, Disp: 90 tablet, Rfl: 1 .  LORazepam (ATIVAN) 0.5 MG tablet, Take 1 tablet (0.5 mg total) by mouth daily as needed for anxiety., Disp: 30 tablet, Rfl: 2 .  meclizine (ANTIVERT) 12.5 MG tablet, TAKE 1 TABLET EVERY DAY  AS NEEDED FOR DIZZINESS, Disp: 30 tablet, Rfl: 2 .  rosuvastatin (CRESTOR) 10 MG tablet, TAKE 1 TABLET BY MOUTH EVERY DAY, Disp: 90 tablet, Rfl: 3 .  sertraline (ZOLOFT) 100 MG tablet, Take 100 mg by mouth daily., Disp: , Rfl:  .  venetoclax 100 MG TABS, Take 200 mg by mouth daily., Disp: 60 tablet, Rfl: 3 .  warfarin (COUMADIN) 5 MG tablet, TAKE AS DIRECTED BY COUMADIN CLINIC (Patient taking differently: TAKE AS DIRECTED BY COUMADIN CLINIC 05/18/2020 42m daily except Sunday  and Thursday then takes 533m, Disp: 195 tablet, Rfl: 1  Allergies:  Allergies  Allergen Reactions  . Hydrocodone-Acetaminophen Shortness Of Breath  . Oxycodone Hcl Shortness Of Breath  . Telmisartan-Hctz Shortness Of Breath and Palpitations    Dizziness, too  . Ramipril   . Aspirin Other (See Comments) and Rash    Other Reaction: confusion Can't take high dose  . Atorvastatin Other (See Comments)    myalgia  . Buspirone Hcl Other (See Comments)    headache  . Codeine Nausea Only and Other (See Comments)    Other Reaction: confusion  . Codeine Phosphate Itching and Nausea Only  . Morphine Sulfate Itching  . Paroxetine Other (See Comments)    Paresthesias: R arm, R leg, r side of face  .  Rabeprazole Other (See Comments)    headache    Past Medical History, Surgical history, Social history, and Family History were reviewed and updated.  Review of Systems: Review of Systems  Constitutional: Negative.   HENT: Negative.   Eyes: Negative.   Respiratory: Negative.   Cardiovascular: Negative.   Gastrointestinal: Negative.   Genitourinary: Negative.   Musculoskeletal: Negative.   Skin: Negative.   Neurological: Negative.   Endo/Heme/Allergies: Negative.   Psychiatric/Behavioral: Negative.     Physical Exam:  weight is 200 lb (90.7 kg). His oral temperature is 98.4 F (36.9 C). His blood pressure is 135/84 and his pulse is 62. His respiration is 18 and oxygen saturation is 100%.   Physical Exam Vitals  reviewed.  Constitutional:      Comments: Axillary exam does not show any palpable adenopathy in either axilla. Marland Kitchen    HENT:     Head: Normocephalic and atraumatic.  Eyes:     Pupils: Pupils are equal, round, and reactive to light.  Neck:     Comments: On his neck exam, he has had nice regression of his lymphadenopathy.  I cannot palpate any adenopathy in the cervical or supraclavicular chains. Cardiovascular:     Rate and Rhythm: Normal rate and regular rhythm.     Heart sounds: Normal heart sounds.  Pulmonary:     Effort: Pulmonary effort is normal.     Breath sounds: Normal breath sounds.  Abdominal:     General: Bowel sounds are normal.     Palpations: Abdomen is soft.  Musculoskeletal:        General: No tenderness or deformity. Normal range of motion.     Cervical back: Normal range of motion.  Lymphadenopathy:     Cervical: No cervical adenopathy.  Skin:    General: Skin is warm and dry.     Findings: No erythema or rash.  Neurological:     Mental Status: He is alert and oriented to person, place, and time.  Psychiatric:        Behavior: Behavior normal.        Thought Content: Thought content normal.        Judgment: Judgment normal.     Lab Results  Component Value Date   WBC 10.2 08/10/2020   HGB 11.8 (L) 08/10/2020   HCT 35.9 (L) 08/10/2020   MCV 102.0 (H) 08/10/2020   PLT 125 (L) 08/10/2020     Chemistry      Component Value Date/Time   NA 138 08/10/2020 1017   NA 145 07/22/2017 0756   NA 139 06/27/2016 1410   K 4.3 08/10/2020 1017   K 4.3 07/22/2017 0756   K 5.1 06/27/2016 1410   CL 102 08/10/2020 1017   CL 105 07/22/2017 0756   CO2 30 08/10/2020 1017   CO2 30 07/22/2017 0756   CO2 28 06/27/2016 1410   BUN 24 (H) 08/10/2020 1017   BUN 17 07/22/2017 0756   BUN 20.9 06/27/2016 1410   CREATININE 1.56 (H) 08/10/2020 1017   CREATININE 1.6 (H) 07/22/2017 0756   CREATININE 1.6 (H) 06/27/2016 1410      Component Value Date/Time   CALCIUM 9.5  08/10/2020 1017   CALCIUM 9.6 07/22/2017 0756   CALCIUM 9.6 06/27/2016 1410   ALKPHOS 44 08/10/2020 1017   ALKPHOS 59 07/22/2017 0756   ALKPHOS 62 06/27/2016 1410   AST 16 08/10/2020 1017   AST 18 06/27/2016 1410   ALT 12 08/10/2020 1017   ALT 24  07/22/2017 0756   ALT 24 06/27/2016 1410   BILITOT 0.4 08/10/2020 1017   BILITOT 0.46 06/27/2016 1410      Impression and Plan: [name] is 74 year-old African-American gentleman.  He has recurrent CLL.    He now is on combination oral therapy.  He is doing well.  His white cell count has come down quite nicely.  We do not make any changes.  I do not think he needs a full dose venetoclax.  I think that the 200 mg daily dose is appropriate.  We can now plan to get him back right before Thanksgiving.  I would like to get him back before Thanksgiving then hopefully be able to get him through all of the holiday season.  At some point, we will do another CT scan on him so we can see how his lymphadenopathy has resolved.  Marland KitchenVolanda Napoleon, MD 9/30/202111:40 AM

## 2020-08-14 ENCOUNTER — Ambulatory Visit (INDEPENDENT_AMBULATORY_CARE_PROVIDER_SITE_OTHER): Payer: Medicare Other | Admitting: *Deleted

## 2020-08-14 ENCOUNTER — Other Ambulatory Visit: Payer: Self-pay

## 2020-08-14 ENCOUNTER — Encounter: Payer: Self-pay | Admitting: Hematology & Oncology

## 2020-08-14 DIAGNOSIS — Z5181 Encounter for therapeutic drug level monitoring: Secondary | ICD-10-CM | POA: Diagnosis not present

## 2020-08-14 DIAGNOSIS — Z952 Presence of prosthetic heart valve: Secondary | ICD-10-CM

## 2020-08-14 DIAGNOSIS — I2699 Other pulmonary embolism without acute cor pulmonale: Secondary | ICD-10-CM | POA: Diagnosis not present

## 2020-08-14 DIAGNOSIS — I480 Paroxysmal atrial fibrillation: Secondary | ICD-10-CM | POA: Diagnosis not present

## 2020-08-14 LAB — POCT INR: INR: 2.8 (ref 2.0–3.0)

## 2020-08-14 NOTE — Patient Instructions (Signed)
Description   Continue taking 1.5 tablets daily except 2 tablets on Mondays and Fridays. Recheck INR in 4 weeks. Please call with any questions or concerns please call [facility] [phone] Main [phone]

## 2020-08-15 ENCOUNTER — Other Ambulatory Visit: Payer: Self-pay | Admitting: Family Medicine

## 2020-09-01 ENCOUNTER — Encounter: Payer: Self-pay | Admitting: Family Medicine

## 2020-09-07 ENCOUNTER — Encounter: Payer: Self-pay | Admitting: Hematology & Oncology

## 2020-09-07 ENCOUNTER — Ambulatory Visit (INDEPENDENT_AMBULATORY_CARE_PROVIDER_SITE_OTHER): Payer: Medicare Other | Admitting: Psychology

## 2020-09-07 DIAGNOSIS — F4321 Adjustment disorder with depressed mood: Secondary | ICD-10-CM | POA: Diagnosis not present

## 2020-09-11 ENCOUNTER — Ambulatory Visit (INDEPENDENT_AMBULATORY_CARE_PROVIDER_SITE_OTHER): Payer: Medicare Other | Admitting: *Deleted

## 2020-09-11 ENCOUNTER — Other Ambulatory Visit: Payer: Self-pay

## 2020-09-11 DIAGNOSIS — Z952 Presence of prosthetic heart valve: Secondary | ICD-10-CM | POA: Diagnosis not present

## 2020-09-11 DIAGNOSIS — Z5181 Encounter for therapeutic drug level monitoring: Secondary | ICD-10-CM

## 2020-09-11 DIAGNOSIS — I2699 Other pulmonary embolism without acute cor pulmonale: Secondary | ICD-10-CM

## 2020-09-11 DIAGNOSIS — I480 Paroxysmal atrial fibrillation: Secondary | ICD-10-CM

## 2020-09-11 LAB — POCT INR: INR: 2.9 (ref 2.0–3.0)

## 2020-09-11 NOTE — Patient Instructions (Signed)
Description   Continue taking 1.5 tablets daily except 2 tablets on Mondays and Fridays. Recheck INR in 4 weeks with MD Appt. Please call with any questions or concerns please call [facility]madin Clinic [phone] Main [phone]

## 2020-09-20 ENCOUNTER — Other Ambulatory Visit: Payer: Self-pay | Admitting: Cardiovascular Disease

## 2020-09-20 ENCOUNTER — Other Ambulatory Visit: Payer: Self-pay | Admitting: Hematology & Oncology

## 2020-09-21 ENCOUNTER — Other Ambulatory Visit: Payer: Self-pay

## 2020-09-21 ENCOUNTER — Encounter: Payer: Self-pay | Admitting: Hematology & Oncology

## 2020-09-21 ENCOUNTER — Inpatient Hospital Stay (HOSPITAL_BASED_OUTPATIENT_CLINIC_OR_DEPARTMENT_OTHER): Payer: Medicare Other | Admitting: Hematology & Oncology

## 2020-09-21 ENCOUNTER — Inpatient Hospital Stay: Payer: Medicare Other | Attending: Hematology & Oncology

## 2020-09-21 VITALS — BP 121/77 | HR 61 | Temp 99.6°F | Resp 19 | Wt 201.0 lb

## 2020-09-21 DIAGNOSIS — I2699 Other pulmonary embolism without acute cor pulmonale: Secondary | ICD-10-CM | POA: Insufficient documentation

## 2020-09-21 DIAGNOSIS — Z7901 Long term (current) use of anticoagulants: Secondary | ICD-10-CM | POA: Diagnosis not present

## 2020-09-21 DIAGNOSIS — C9110 Chronic lymphocytic leukemia of B-cell type not having achieved remission: Secondary | ICD-10-CM

## 2020-09-21 DIAGNOSIS — R911 Solitary pulmonary nodule: Secondary | ICD-10-CM | POA: Insufficient documentation

## 2020-09-21 DIAGNOSIS — Z79899 Other long term (current) drug therapy: Secondary | ICD-10-CM | POA: Insufficient documentation

## 2020-09-21 LAB — CBC WITH DIFFERENTIAL (CANCER CENTER ONLY)
Abs Immature Granulocytes: 0.32 10*3/uL — ABNORMAL HIGH (ref 0.00–0.07)
Basophils Absolute: 0 10*3/uL (ref 0.0–0.1)
Basophils Relative: 0 %
Eosinophils Absolute: 0 10*3/uL (ref 0.0–0.5)
Eosinophils Relative: 0 %
HCT: 36.9 % — ABNORMAL LOW (ref 39.0–52.0)
Hemoglobin: 12 g/dL — ABNORMAL LOW (ref 13.0–17.0)
Immature Granulocytes: 7 %
Lymphocytes Relative: 59 %
Lymphs Abs: 2.8 10*3/uL (ref 0.7–4.0)
MCH: 32.9 pg (ref 26.0–34.0)
MCHC: 32.5 g/dL (ref 30.0–36.0)
MCV: 101.1 fL — ABNORMAL HIGH (ref 80.0–100.0)
Monocytes Absolute: 0.3 10*3/uL (ref 0.1–1.0)
Monocytes Relative: 7 %
Neutro Abs: 1.3 10*3/uL — ABNORMAL LOW (ref 1.7–7.7)
Neutrophils Relative %: 27 %
Platelet Count: 141 10*3/uL — ABNORMAL LOW (ref 150–400)
RBC: 3.65 MIL/uL — ABNORMAL LOW (ref 4.22–5.81)
RDW: 12.4 % (ref 11.5–15.5)
WBC Count: 4.7 10*3/uL (ref 4.0–10.5)
nRBC: 0 % (ref 0.0–0.2)

## 2020-09-21 LAB — CMP (CANCER CENTER ONLY)
ALT: 12 U/L (ref 0–44)
AST: 17 U/L (ref 15–41)
Albumin: 4.3 g/dL (ref 3.5–5.0)
Alkaline Phosphatase: 49 U/L (ref 38–126)
Anion gap: 9 (ref 5–15)
BUN: 25 mg/dL — ABNORMAL HIGH (ref 8–23)
CO2: 28 mmol/L (ref 22–32)
Calcium: 9.5 mg/dL (ref 8.9–10.3)
Chloride: 101 mmol/L (ref 98–111)
Creatinine: 1.6 mg/dL — ABNORMAL HIGH (ref 0.61–1.24)
GFR, Estimated: 45 mL/min — ABNORMAL LOW (ref >60)
Glucose, Bld: 102 mg/dL — ABNORMAL HIGH (ref 70–99)
Potassium: 4 mmol/L (ref 3.5–5.1)
Sodium: 138 mmol/L (ref 135–145)
Total Bilirubin: 0.3 mg/dL (ref 0.3–1.2)
Total Protein: 6.5 g/dL (ref 6.5–8.1)

## 2020-09-21 LAB — PROTIME-INR
INR: 2.4 — ABNORMAL HIGH (ref 0.8–1.2)
Prothrombin Time: 25.6 seconds — ABNORMAL HIGH (ref 11.4–15.2)

## 2020-09-21 LAB — SAVE SMEAR(SSMR), FOR PROVIDER SLIDE REVIEW

## 2020-09-21 NOTE — Progress Notes (Signed)
Hematology and Oncology Follow Up Visit  Abdur Hoglund Corvin [id number] 1946-07-14 74 y.o. 09/21/2020   Principle Diagnosis:   Right lower lobe pulmonary nodule- non-malignant  Recurrent pulmonary embolism  CLL -- progressive -- 11q-/13q-  Current Therapy:    Coumadin 5 mg po q day  Calquence 100 mg po BID -- start on 03/13/2020  Venetoclax 200 mg po q day -- start in August 2021       Interim History:  Mr.  Franks is back for follow-up.  So far, he is done quite well with the venetoclax added to the acalabrutinib.  His white cell count is coming down nicely.  The percentage of lymphocytes is finally starting to come down.  Lymphocyte percentage went from 91% down to 59%.  I suspect that his neutrophils will start coming up as his bone marrow starts to normalize.  He will be going up to [place] for Thanksgiving.  He is going to take his dog with him.  He has had no problems with bowels or bladder.  He has had no bleeding.  There has been no cough or shortness of breath.  He is on Coumadin.  Cardiology is managing the Coumadin dose.  We do need to get a CT scan on him.  We need to see how his lymphadenopathy has responded to the combination of Calquence and venetoclax.  Currently, his performance status is ECOG 0.    Medications:  Current Outpatient Medications:  .  acalabrutinib (CALQUENCE) 100 MG capsule, Take 1 capsule (100 mg total) by mouth 2 (two) times daily., Disp: 60 capsule, Rfl: 6 .  allopurinol (ZYLOPRIM) 100 MG tablet, TAKE 1 TABLET BY MOUTH EVERY DAY, Disp: 90 tablet, Rfl: 0 .  amoxicillin (AMOXIL) 500 MG capsule, Take 500 mg by mouth 2 (two) times daily. Prior to dental procedures., Disp: , Rfl:  .  cholecalciferol (VITAMIN D) 1000 units tablet, Take 1,000 Units by mouth daily., Disp: , Rfl:  .  hydrochlorothiazide (HYDRODIURIL) 12.5 MG tablet, TAKE 1 TABLET BY MOUTH EVERY DAY, Disp: 90 tablet, Rfl: 1 .  LORazepam (ATIVAN) 0.5 MG tablet, Take 1 tablet (0.5 mg  total) by mouth daily as needed for anxiety., Disp: 30 tablet, Rfl: 2 .  meclizine (ANTIVERT) 12.5 MG tablet, TAKE 1 TABLET EVERY DAY AS NEEDED FOR DIZZINESS, Disp: 30 tablet, Rfl: 2 .  rosuvastatin (CRESTOR) 10 MG tablet, TAKE 1 TABLET BY MOUTH EVERY DAY, Disp: 90 tablet, Rfl: 3 .  sertraline (ZOLOFT) 100 MG tablet, Take 100 mg by mouth daily., Disp: , Rfl:  .  venetoclax 100 MG TABS, Take 200 mg by mouth daily., Disp: 60 tablet, Rfl: 3 .  warfarin (COUMADIN) 5 MG tablet, TAKE AS DIRECTED BY COUMADIN CLINIC, Disp: 160 tablet, Rfl: 1  Allergies:  Allergies  Allergen Reactions  . Hydrocodone-Acetaminophen Shortness Of Breath  . Oxycodone Hcl Shortness Of Breath  . Telmisartan-Hctz Shortness Of Breath and Palpitations    Dizziness, too  . Ramipril   . Aspirin Other (See Comments) and Rash    Other Reaction: confusion Can't take high dose  . Atorvastatin Other (See Comments)    myalgia  . Buspirone Hcl Other (See Comments)    headache  . Codeine Nausea Only and Other (See Comments)    Other Reaction: confusion  . Codeine Phosphate Itching and Nausea Only  . Morphine Sulfate Itching  . Paroxetine Other (See Comments)    Paresthesias: R arm, R leg, r side of face  . Rabeprazole Other (See  Comments)    headache    Past Medical History, Surgical history, Social history, and Family History were reviewed and updated.  Review of Systems: Review of Systems  Constitutional: Negative.   HENT: Negative.   Eyes: Negative.   Respiratory: Negative.   Cardiovascular: Negative.   Gastrointestinal: Negative.   Genitourinary: Negative.   Musculoskeletal: Negative.   Skin: Negative.   Neurological: Negative.   Endo/Heme/Allergies: Negative.   Psychiatric/Behavioral: Negative.     Physical Exam:  weight is 201 lb (91.2 kg). His oral temperature is 99.6 F (37.6 C). His blood pressure is 121/77 and his pulse is 61. His respiration is 19 and oxygen saturation is 97%.   Physical  Exam Vitals reviewed.  Constitutional:      Comments: Axillary exam does not show any palpable adenopathy in either axilla. Marland Kitchen    HENT:     Head: Normocephalic and atraumatic.  Eyes:     Pupils: Pupils are equal, round, and reactive to light.  Neck:     Comments: On his neck exam, he has had nice regression of his lymphadenopathy.  I cannot palpate any adenopathy in the cervical or supraclavicular chains. Cardiovascular:     Rate and Rhythm: Normal rate and regular rhythm.     Heart sounds: Normal heart sounds.  Pulmonary:     Effort: Pulmonary effort is normal.     Breath sounds: Normal breath sounds.  Abdominal:     General: Bowel sounds are normal.     Palpations: Abdomen is soft.  Musculoskeletal:        General: No tenderness or deformity. Normal range of motion.     Cervical back: Normal range of motion.  Lymphadenopathy:     Cervical: No cervical adenopathy.  Skin:    General: Skin is warm and dry.     Findings: No erythema or rash.  Neurological:     Mental Status: He is alert and oriented to person, place, and time.  Psychiatric:        Behavior: Behavior normal.        Thought Content: Thought content normal.        Judgment: Judgment normal.     Lab Results  Component Value Date   WBC 4.7 09/21/2020   HGB 12.0 (L) 09/21/2020   HCT 36.9 (L) 09/21/2020   MCV 101.1 (H) 09/21/2020   PLT 141 (L) 09/21/2020     Chemistry      Component Value Date/Time   NA 138 09/21/2020 1401   NA 145 07/22/2017 0756   NA 139 06/27/2016 1410   K 4.0 09/21/2020 1401   K 4.3 07/22/2017 0756   K 5.1 06/27/2016 1410   CL 101 09/21/2020 1401   CL 105 07/22/2017 0756   CO2 28 09/21/2020 1401   CO2 30 07/22/2017 0756   CO2 28 06/27/2016 1410   BUN 25 (H) 09/21/2020 1401   BUN 17 07/22/2017 0756   BUN 20.9 06/27/2016 1410   CREATININE 1.60 (H) 09/21/2020 1401   CREATININE 1.6 (H) 07/22/2017 0756   CREATININE 1.6 (H) 06/27/2016 1410      Component Value Date/Time   CALCIUM  9.5 09/21/2020 1401   CALCIUM 9.6 07/22/2017 0756   CALCIUM 9.6 06/27/2016 1410   ALKPHOS 49 09/21/2020 1401   ALKPHOS 59 07/22/2017 0756   ALKPHOS 62 06/27/2016 1410   AST 17 09/21/2020 1401   AST 18 06/27/2016 1410   ALT 12 09/21/2020 1401   ALT 24 07/22/2017 0756  ALT 24 06/27/2016 1410   BILITOT 0.3 09/21/2020 1401   BILITOT 0.46 06/27/2016 1410      Impression and Plan: Mr. Eichhorst is 74 year-old African-American gentleman.  He has recurrent CLL.    He now is on combination oral therapy.  He is doing well.  His white cell count has come down quite nicely.  We do not make any change with the venetoclax dose.  We may have to cut the dose back to 100 mg a day.  I really do not want to see the white cell count go down much more.  We will plan to do our scans we see him back.  I will see him back in about a month.  I am just happy that the labs show that the combination is working well.     Marland KitchenVolanda Napoleon, MD 11/11/20212:59 PM

## 2020-09-22 ENCOUNTER — Telehealth: Payer: Self-pay

## 2020-09-22 NOTE — Telephone Encounter (Signed)
Called and left message with appts per 09/21/20 los pt to get a call for scan... AOM

## 2020-09-29 ENCOUNTER — Encounter: Payer: Self-pay | Admitting: Family Medicine

## 2020-09-29 NOTE — Progress Notes (Signed)
Date:  10/11/2020   ID:  [name], DOB [birth year], MRN [id number]  Provider Location: Office  PCP:  [name], MD  Cardiologist:   [name] Electrophysiologist:  None   Evaluation Performed:  Follow-Up Visit  Chief Complaint:  AVR PAF   History of Present Illness:    74 y.o. with bioprosthetic AVR Dr [name] at [facility] July 2011 for severe AR No CAD. History of PAF and recurrent DVT/PE on chronic xarelto Also had LUE cephalic/basilic thrombus. CRF;s include HTN and HLD. Has CLL followed by Dr [name] post chemoRx. Also had radical prostatectomy for cancer In 2016 had RLL wedge resection for granulomatous benign nodule Has had sleep issues with nightmares and we have since stopped his beta blocker including lopressor and atenolol He is seen at [facility] for PTSD and uses Zoloft   Had right foot reconstruction 03/02/19 involving the right gastrocnemius tendon for foot drop  Echo 08/27/19 showed increased gradients on his 25 mm CE bioprosthesis with CT showing HALT/HAM started on coumadin instead of xarelto Concern related to progression of his CLL and marantic. On coumadin gradients improved somewhat with mean 39-30 mmHg peak 65-55 mmHg by TTE 12/15/19 Latest echo 06/21/20 with EF 60-65% mean gradient 27 mmHg peak 47 mmHg DVI 0.27 Coumadin levels / INR have been Rx Started on Calquence by Dr [name] 03/22/20 to Rx CLL   Seen by PA 03/10/20 with dyspnea and palpitations Thought to be anxiety related  Soft tissue CT neck 03/15/20 with moderate bulky cervical lymphadenopathy new/progressed since 2019 Seen by Dr [name] 09/21/20 felt the venetoclax and acalabrutnib doing good job bringing lymphocyte count down from 91% to 51%  F/U CT scanning planned for latter in December  Has been hoarse for weeks. Has ENT appointment but not till end of month Had some dental implant work A few weeks ago. Concern that it is related to his CLL and worsening lymphadenopathy     Past Medical History:  Diagnosis  Date  . Anticoagulants causing adverse effect in therapeutic use   . Anxiety    Paxil seemed to cause odd neuro side effects; better on prozac as of 09/2015  . Aortic regurgitation 04/2007 surgery   Resolved with tissue AVR at [facility]  . Cervical spondylosis    ESI by [name] in Ortho. 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 Chronic lymphocytic leukemia (CLL), B-cell ([place]) approx 2012   stable on f/u's with [name] (most recent 07/2017)  . History of prostate cancer 2003   Rad prost  . History of pulmonary embolism 2011; 10/2014   Recurrence off of anticoagulation 10/2014: per hem/onc ([name]) pt needs lifelong anticoagulation (hypercoag w/u neg).  . Hyperlipidemia    statin-intolerant, except for crestor low dose.  Marland Kitchen Hypertension   . Migraine syndrome   . Mitral regurgitation   . PAF (paroxysmal atrial fibrillation) ([place])   . Panic attacks    "           "             "                  "                  "                       "                             "                         .  Pulmonary nodule, right 2015   RLL: resected at [facility] --VATS; Benign RLL nodule (fungal and AFB stains neg).  Plan is for [facility] thoracic surg to do f/u o/v & CT chest 1 yr   . Right-sided headache 07/2016   Primary stabbing HA or migraine per neuro (Dr. Tomi Likens).  Gabapentin and topamax not tolerated.  These resolved spontaneously.   Past Surgical History:  Procedure Laterality Date  . AORTIC VALVE REPLACEMENT  2011   [facility]  (bioprosthetic)  . CARDIAC CATHETERIZATION  04/2010   Normal coronaries.  . Carotid dopplers  08/2015   NORMAL  . CATARACT EXTRACTION     L 12/06/09   R 09/14/09  . COLONOSCOPY  01/26/13   tic's, o/w normal.  ([facility])--recall 10 yrs.  Marland Kitchen PENILE PROSTHESIS IMPLANT    . PROSTATECTOMY  04/2002   for prostate cancer  . Pulmonary nodule resection  Fall/winter 2016   [facility]  . SHOULDER SURGERY     rotator cuff on both arms   . TIBIALIS TENDON TRANSFER / REPAIR Right 03/02/2019    Procedure: RECONSTRUCTION OF RIGHT  ANTERIOR TIBIALIS TENDON;  Surgeon: Erle Crocker, MD;  Location: [facility];  Service: Orthopedics;  Laterality: Right;  . TRANSTHORACIC ECHOCARDIOGRAM  09/20/2014; 03/2016; 03/2017   2015-mild LVH, EF 50-55%, wall motion nl, grade I diast dysfxn, prosth aort valve good,transaortic gradients decreased compared to echo 11/2013.   03/2016: EF 55-60%, normal LV function, severe LVH, normal diast fxn, mild increase in AV gradient compared to 09/2014 echo.  2018: EF 55-60%, normal LV fxn, grd II DD, AV stable/gradient's stable.     Current Meds  Medication Sig  . acalabrutinib (CALQUENCE) 100 MG capsule Take 1 capsule (100 mg total) by mouth 2 (two) times daily.  Marland Kitchen allopurinol (ZYLOPRIM) 100 MG tablet TAKE 1 TABLET BY MOUTH EVERY DAY  . amoxicillin (AMOXIL) 500 MG capsule Take 500 mg by mouth 2 (two) times daily. Prior to dental procedures.  . cholecalciferol (VITAMIN D) 1000 units tablet Take 1,000 Units by mouth daily.  . hydrochlorothiazide (HYDRODIURIL) 12.5 MG tablet TAKE 1 TABLET BY MOUTH EVERY DAY  . ipratropium (ATROVENT) 0.03 % nasal spray Place 2 sprays into both nostrils every 12 (twelve) hours.  Marland Kitchen LORazepam (ATIVAN) 0.5 MG tablet Take 1 tablet (0.5 mg total) by mouth daily as needed for anxiety.  . meclizine (ANTIVERT) 12.5 MG tablet TAKE 1 TABLET EVERY DAY AS NEEDED FOR DIZZINESS  . rosuvastatin (CRESTOR) 10 MG tablet TAKE 1 TABLET BY MOUTH EVERY DAY  . sertraline (ZOLOFT) 100 MG tablet Take 100 mg by mouth daily.  Marland Kitchen venetoclax 100 MG TABS Take 200 mg by mouth daily.  Marland Kitchen warfarin (COUMADIN) 5 MG tablet TAKE AS DIRECTED BY COUMADIN CLINIC     Allergies:   Hydrocodone-acetaminophen, Oxycodone hcl, Telmisartan-hctz, Ramipril, Aspirin, Atorvastatin, Buspirone hcl, Codeine, Codeine phosphate, Morphine sulfate, Paroxetine, and Rabeprazole   Social History   Tobacco Use  . Smoking status: Former Smoker    Packs/day: 1.00    Years:  12.00    Pack years: 12.00    Types: Cigarettes    Start date: 11/28/1968    Quit date: 01/29/1981    Years since quitting: 39.7  . Smokeless tobacco: Never Used  . Tobacco comment: quit 74 yo   Vaping Use  . Vaping Use: Never used  Substance Use Topics  . Alcohol use: Yes    Alcohol/week: 0.0 standard drinks    Comment: occasional  . Drug use: No  Family Hx: The patient's family history includes Cancer in his sister; Colon cancer (age of onset: 74) in his sister; Prostate cancer in his father; Stroke in his brother, mother, and sister. There is no history of Heart attack.  ROS:   Please see the history of present illness.     All other systems reviewed and are negative.   Prior CV studies:   The following studies were reviewed today:  Echo 04/08/18  EF 60-65% mild MR moderate LAE stable mean gradient through AVR 18 mmHg peak 31 mmHg DVI 0.32   Labs/Other Tests and Data Reviewed:    EKG:  SR rate 73 RBBB/LAFB 10/29/18   Recent Labs: 09/21/2020: ALT 12; BUN 25; Creatinine 1.60; Hemoglobin 12.0; Platelet Count 141; Potassium 4.0; Sodium 138   Recent Lipid Panel Lab Results  Component Value Date/Time   CHOL 181 05/12/2020 08:40 AM   TRIG 114.0 05/12/2020 08:40 AM   HDL 48.50 05/12/2020 08:40 AM   CHOLHDL 4 05/12/2020 08:40 AM   LDLCALC 110 (H) 05/12/2020 08:40 AM   LDLDIRECT 105.0 09/29/2018 01:42 PM    Wt Readings from Last 3 Encounters:  10/11/20 92.4 kg  10/04/20 91.7 kg  09/21/20 91.2 kg     Objective:    Vital Signs:  BP 122/80   Pulse 78   Ht 5\' 10"  (1.778 m)   Wt 92.4 kg   SpO2 95%   BMI 29.21 kg/m    Affect appropriate Hoarse  Healthy:  appears stated age HEENT:bilateral supraclavicular adenopathy worse on left  JVP normal left bruits no thyromegaly Lungs clear with no wheezing and good diaphragmatic motion Heart:  S1/S2 AS  murmur, no rub, gallop or click PMI normal post sternotomy for AVR  Abdomen: benighn, BS positve, no tenderness, no  AAA no bruit.  No HSM or HJR Distal pulses intact with no bruits No edema Neuro non-focal Skin warm and dry No muscular weakness   ASSESSMENT & PLAN:    AVR:  2011 25 mm CE. Marked increase in gradients on echo 08/27/19 with cardiac CT showeing HALT/HAM despite being on xarelto. Changed to coumadin. With improvement by echo 06/21/20 No doubt his tissue AVR is starting to fail. F/U echo February 2022 May be a candidate for valve in valve TAVR in future   History of DVT (deep vein thrombosis) and Recurrent pulmonary embolism Continue coumadin . This is managed by heme/onc.   Paroxysmal atrial fibrillation Maintaining NSR.  Continue  Coumadin    Essential hypertension Borderline control.  Continue to monitor.  HLD (hyperlipidemia) Has had leg pain with statins in past. Taking crestor 3 x/week   Lung nodule  Post VATS at [facility]  Recent CT see above f/u [facility]   Carotid Bruit:  Left duplex no stenosis f/u 08/2019   Depression:  Better off paroxetine now on low dose prozac with tofranil  Headache:  F/u primary PRN tylenol ok has schrap metal on right side of head Cant have MRI  AST:MHDQ with primary typically Cr around 1.6 on 02/16/18    BBB:  RBBB LAD post AVR ECG q 6 months follow for more advanced AV block   CLL:  F/u with Dr [name] on dual Rx with improvement in lymphocyte counts due to have CT adenopathy in neck December   Insomnia:  Improved off beta blocker and on zoloft   ENT:  Hoarseness persistent will try to expedite f/u with ENT needs vocal chords looked at    Medication Adjustments/Labs and Tests Ordered:  Current medicines are reviewed at length with the patient today.  Concerns regarding medicines are outlined above.   Tests Ordered:  Echo for AVR February 2022   Medication Changes:  None   Disposition:  F/u with me in 6 months when he has his echo   Signed, Jenkins Rouge, MD  10/11/2020 8:50 AM    [facility]

## 2020-09-29 NOTE — Telephone Encounter (Signed)
Pt called our office like advised, but is really not wanting to come for an appt if possible. He is looking for a recommendation of possibly an OTC medication that he can be taking.

## 2020-10-02 ENCOUNTER — Other Ambulatory Visit: Payer: Medicare Other

## 2020-10-02 DIAGNOSIS — Z20822 Contact with and (suspected) exposure to covid-19: Secondary | ICD-10-CM

## 2020-10-02 NOTE — Telephone Encounter (Signed)
Patient has been scheduled for covid testing.

## 2020-10-03 LAB — SARS-COV-2, NAA 2 DAY TAT

## 2020-10-03 LAB — NOVEL CORONAVIRUS, NAA: SARS-CoV-2, NAA: NOT DETECTED

## 2020-10-04 ENCOUNTER — Ambulatory Visit: Payer: Medicare Other | Admitting: Physician Assistant

## 2020-10-04 ENCOUNTER — Encounter: Payer: Self-pay | Admitting: Physician Assistant

## 2020-10-04 ENCOUNTER — Other Ambulatory Visit: Payer: Self-pay

## 2020-10-04 VITALS — BP 118/70 | HR 75 | Temp 97.8°F | Ht 70.0 in | Wt 202.2 lb

## 2020-10-04 DIAGNOSIS — R490 Dysphonia: Secondary | ICD-10-CM | POA: Diagnosis not present

## 2020-10-04 MED ORDER — IPRATROPIUM BROMIDE 0.03 % NA SOLN: 2.0000 | Freq: Two times a day (BID) | NASAL | 12 refills | Status: AC

## 2020-10-04 NOTE — Progress Notes (Signed)
[name] is a 74 y.o. male here for a new problem.  I acted as a Education administrator for [employer], PA-C Anselmo Pickler, LPN   History of Present Illness:   Chief Complaint  Patient presents with  . Hoarse    HPI    Hoarse voice quality Pt c/o voice being hoarse x 2 weeks off and on, was sore for few hours a week ago. Pt says as he talks throughout the day it gets worse. Denies fever or chills. Had some recent nasal congestion but does tend to get this seasonally.   Hot showers are the most helpful for his symptoms. Trialed saline nasal spray from [facility] and took Robitussin DM with little relief of symptoms.  Denies unintentional weight loss, bloody cough, night sweats, unusual throat pain/swelling.   Past Medical History:  Diagnosis Date  . Anticoagulants causing adverse effect in therapeutic use   . Anxiety    Paxil seemed to cause odd neuro side effects; better on prozac as of 09/2015  . Aortic regurgitation 04/2007 surgery   Resolved with tissue AVR at [facility]  . Cervical spondylosis    [facility] by [name] in Ortho. 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 Chronic lymphocytic leukemia (CLL), B-cell ([place]) approx 2012   stable on f/u's with [name] (most recent 07/2017)  . History of prostate cancer 2003   Rad prost  . History of pulmonary embolism 2011; 10/2014   Recurrence off of anticoagulation 10/2014: per hem/onc ([name]) pt needs lifelong anticoagulation (hypercoag w/u neg).  . Hyperlipidemia    statin-intolerant, except for crestor low dose.  Marland Kitchen Hypertension   . Migraine syndrome   . Mitral regurgitation   . PAF (paroxysmal atrial fibrillation) ([place])   . Panic attacks    "           "             "                  "                  "                       "                             "                         . Pulmonary nodule, right 2015   RLL: resected at [facility]; Benign RLL nodule (fungal and AFB stains neg).  Plan is for [facility] 2 thoracic surg to do f/u o/v & CT  chest 1 yr   . Right-sided headache 07/2016   Primary stabbing HA or migraine per neuro (Dr. Tomi Likens).  Gabapentin and topamax not tolerated.  These resolved spontaneously.     Social History   Tobacco Use  . Smoking status: Former Smoker    Packs/day: 1.00    Years: 12.00    Pack years: 12.00    Types: Cigarettes    Start date: 11/28/1968    Quit date: 01/29/1981    Years since quitting: 39.7  . Smokeless tobacco: Never Used  . Tobacco comment: quit 74 yo   Vaping Use  . Vaping Use: Never used  Substance Use Topics  . Alcohol use: Yes    Alcohol/week: 0.0  standard drinks    Comment: occasional  . Drug use: No    Past Surgical History:  Procedure Laterality Date  . AORTIC VALVE REPLACEMENT  2011   [facility]  (bioprosthetic)  . CARDIAC CATHETERIZATION  04/2010   Normal coronaries.  . Carotid dopplers  08/2015   NORMAL  . CATARACT EXTRACTION     L 12/06/09   R 09/14/09  . COLONOSCOPY  01/26/13   tic's, o/w normal.  ([facility])--recall 10 yrs.  Marland Kitchen PENILE PROSTHESIS IMPLANT    . PROSTATECTOMY  04/2002   for prostate cancer  . Pulmonary nodule resection  Fall/winter 2016   [facility]  . SHOULDER SURGERY     rotator cuff on both arms   . TIBIALIS TENDON TRANSFER / REPAIR Right 03/02/2019   Procedure: RECONSTRUCTION OF RIGHT  ANTERIOR TIBIALIS TENDON;  Surgeon: Erle Crocker, MD;  Location: [place];  Service: Orthopedics;  Laterality: Right;  . TRANSTHORACIC ECHOCARDIOGRAM  09/20/2014; 03/2016; 03/2017   2015-mild LVH, EF 50-55%, wall motion nl, grade I diast dysfxn, prosth aort valve good,transaortic gradients decreased compared to echo 11/2013.   03/2016: EF 55-60%, normal LV function, severe LVH, normal diast fxn, mild increase in AV gradient compared to 09/2014 echo.  2018: EF 55-60%, normal LV fxn, grd II DD, AV stable/gradient's stable.    Family History  Problem Relation Age of Onset  . Cancer Sister        uterine  . Colon cancer Sister 57  . Stroke Mother   .  Prostate cancer Father   . Stroke Sister   . Stroke Brother   . Heart attack Neg Hx     Allergies  Allergen Reactions  . Hydrocodone-Acetaminophen Shortness Of Breath  . Oxycodone Hcl Shortness Of Breath  . Telmisartan-Hctz Shortness Of Breath and Palpitations    Dizziness, too  . Ramipril   . Aspirin Other (See Comments) and Rash    Other Reaction: confusion Can't take high dose  . Atorvastatin Other (See Comments)    myalgia  . Buspirone Hcl Other (See Comments)    headache  . Codeine Nausea Only and Other (See Comments)    Other Reaction: confusion  . Codeine Phosphate Itching and Nausea Only  . Morphine Sulfate Itching  . Paroxetine Other (See Comments)    Paresthesias: R arm, R leg, r side of face  . Rabeprazole Other (See Comments)    headache    Current Medications:   Current Outpatient Medications:  .  acalabrutinib (CALQUENCE) 100 MG capsule, Take 1 capsule (100 mg total) by mouth 2 (two) times daily., Disp: 60 capsule, Rfl: 6 .  allopurinol (ZYLOPRIM) 100 MG tablet, TAKE 1 TABLET BY MOUTH EVERY DAY, Disp: 90 tablet, Rfl: 0 .  amoxicillin (AMOXIL) 500 MG capsule, Take 500 mg by mouth 2 (two) times daily. Prior to dental procedures., Disp: , Rfl:  .  cholecalciferol (VITAMIN D) 1000 units tablet, Take 1,000 Units by mouth daily., Disp: , Rfl:  .  hydrochlorothiazide (HYDRODIURIL) 12.5 MG tablet, TAKE 1 TABLET BY MOUTH EVERY DAY, Disp: 90 tablet, Rfl: 1 .  LORazepam (ATIVAN) 0.5 MG tablet, Take 1 tablet (0.5 mg total) by mouth daily as needed for anxiety., Disp: 30 tablet, Rfl: 2 .  meclizine (ANTIVERT) 12.5 MG tablet, TAKE 1 TABLET EVERY DAY AS NEEDED FOR DIZZINESS, Disp: 30 tablet, Rfl: 2 .  rosuvastatin (CRESTOR) 10 MG tablet, TAKE 1 TABLET BY MOUTH EVERY DAY, Disp: 90 tablet, Rfl: 3 .  sertraline (ZOLOFT)  100 MG tablet, Take 100 mg by mouth daily., Disp: , Rfl:  .  venetoclax 100 MG TABS, Take 200 mg by mouth daily., Disp: 60 tablet, Rfl: 3 .  warfarin (COUMADIN)  5 MG tablet, TAKE AS DIRECTED BY COUMADIN CLINIC, Disp: 160 tablet, Rfl: 1 .  ipratropium (ATROVENT) 0.03 % nasal spray, Place 2 sprays into both nostrils every 12 (twelve) hours., Disp: 30 mL, Rfl: 12   Review of Systems:   ROS  Negative unless otherwise specified per HPI.  Vitals:   Vitals:   10/04/20 1314  BP: 118/70  Pulse: 75  Temp: 97.8 F (36.6 C)  TempSrc: Temporal  SpO2: 97%  Weight: 202 lb 4 oz (91.7 kg)  Height: 5\' 10"  (1.778 m)     Body mass index is 29.02 kg/m.  Physical Exam:   Physical Exam Vitals and nursing note reviewed.  Constitutional:      General: He is not in acute distress.    Appearance: He is well-developed. He is not ill-appearing or toxic-appearing.     Comments: Significant hoarse voice quality  HENT:     Head: Normocephalic and atraumatic.     Right Ear: Tympanic membrane, ear canal and external ear normal. Tympanic membrane is not erythematous, retracted or bulging.     Left Ear: Tympanic membrane, ear canal and external ear normal. Tympanic membrane is not erythematous, retracted or bulging.     Nose: Nose normal.     Right Sinus: No maxillary sinus tenderness or frontal sinus tenderness.     Left Sinus: No maxillary sinus tenderness or frontal sinus tenderness.     Mouth/Throat:     Pharynx: Uvula midline. No posterior oropharyngeal erythema.  Eyes:     General: Lids are normal.     Conjunctiva/sclera: Conjunctivae normal.  Neck:     Trachea: Trachea normal.  Cardiovascular:     Rate and Rhythm: Normal rate and regular rhythm.     Heart sounds: Normal heart sounds, S1 normal and S2 normal.  Pulmonary:     Effort: Pulmonary effort is normal.     Breath sounds: Normal breath sounds. No decreased breath sounds, wheezing, rhonchi or rales.  Lymphadenopathy:     Cervical: No cervical adenopathy.  Skin:    General: Skin is warm and dry.  Neurological:     Mental Status: He is alert.  Psychiatric:        Speech: Speech normal.         Behavior: Behavior normal. Behavior is cooperative.     Assessment and Plan:   Izik was seen today for hoarse.  Diagnoses and all orders for this visit:  Hoarse voice quality Symptoms present for 2 weeks. Discussed that evidence-based guidelines recommend ENT evaluation for persistent hoarse throat after 3 weeks. Will go ahead and place referral. In the interim, will trial atrovent nasal spray. If this is ineffective, may trial claritin or zyrtec. -     Ambulatory referral to ENT  Other orders -     ipratropium (ATROVENT) 0.03 % nasal spray; Place 2 sprays into both nostrils every 12 (twelve) hours.  CMA or LPN served as scribe during this visit. History, Physical, and Plan performed by medical provider. The above documentation has been reviewed and is accurate and complete.  Inda Coke, PA-C

## 2020-10-04 NOTE — Patient Instructions (Addendum)
It was great to see you!  Trial the daily nasal spray that I have sent in to see if this helps dry up your nasal passages and post nasal drip.  If this doesn't work, you may trial daily antihistamine such as Claritin/Loratadine and Zyrtec/Cetrizine to see if this can help.   I'm going to put in a referral for the Ear, Nose, Throat doctor for further evaluation.  Take care,  Inda Coke PA-C  Hoarseness  Hoarseness, also called dysphonia, is any abnormal change in your voice that can make it difficult to speak. Your voice may sound raspy, breathy, or strained. Hoarseness is caused by a problem with your vocal cords (vocal folds). These are two bands of tissue inside your voice box (larynx). When you speak, your vocal cords move back and forth to create sound. The surfaces of your vocal cords need to be smooth for your voice to sound clear. Swelling or lumps on your vocal cords can cause hoarseness. Common causes of vocal cord problems include:  Infection in the nose, throat, and upper air passages (upper respiratory infection).  A long-term cough.  Straining or overusing your voice.  Smoking, or exposure to secondhand smoke.  Allergies.  Medication side effects.  Vocal cord growths.  Vocal cord injuries.  Stomach acids that move up in your throat and irritate your vocal cords (gastroesophageal reflux).  Diseases that affect the nervous system, such as a stroke or Parkinson's disease. Follow these instructions at home: Watch your condition for any changes. To ease discomfort and protect your vocal cords:  Rest your voice.  Do not whisper. Whispering can cause muscle strain.  Do not speak in a loud or harsh voice.  Avoid coughing or clearing your throat.  Do not use any products that contain nicotine or tobacco, such as cigarettes and e-cigarettes. If you need help quitting, ask your health care provider.  Avoid secondhand smoke.  Do not eat foods that give you  heartburn, such as spicy or acidic foods like hot peppers and orange juice. Heartburn can make gastroesophageal reflux worse.  Do not drink beverages that contain caffeine (coffee, tea, or soft drinks) or alcohol (beer, wine, or liquor).  Drink enough fluid to keep your urine pale yellow.  Use a humidifier if the air in your home is dry. If recommended by your health care provider, schedule an appointment with a speech-language specialist. This specialist may give you methods to try that can help you avoid misusing your voice. Contact a health care provider if:  You have hoarseness that lasts longer than 3 weeks.  You almost lose or completely lose your voice for more than 3 days.  You have pain when you swallow or try to talk.  You feel a lump in your neck. Get help right away if:  You have trouble swallowing.  You feel like you are choking when you swallow.  You cough up blood or vomit blood.  You have trouble breathing.  You choke, cannot swallow, or cannot breathe if you lie flat.  You notice swelling or a rash on your body, face, or tongue. Summary  Hoarseness, also called dysphonia, is any abnormal change in your voice that can make it difficult to speak. Your voice may sound raspy, breathy, or strained.  Hoarseness is caused by a problem with your vocal cords (vocal folds).  Do not speak in a loud or harsh voice, use nicotine or tobacco products, or eat foods that give you heartburn.  If recommended  by your health care provider, meet with a speech-language specialist. This information is not intended to replace advice given to you by your health care provider. Make sure you discuss any questions you have with your health care provider. Document Revised: 10/10/2017 Document Reviewed: 07/25/2017 Elsevier Patient Education  [place].

## 2020-10-06 ENCOUNTER — Other Ambulatory Visit: Payer: Self-pay | Admitting: Family Medicine

## 2020-10-06 ENCOUNTER — Encounter: Payer: Self-pay | Admitting: Family Medicine

## 2020-10-10 ENCOUNTER — Encounter: Payer: Self-pay | Admitting: Hematology & Oncology

## 2020-10-11 ENCOUNTER — Other Ambulatory Visit: Payer: Self-pay

## 2020-10-11 ENCOUNTER — Ambulatory Visit (INDEPENDENT_AMBULATORY_CARE_PROVIDER_SITE_OTHER): Payer: Medicare Other | Admitting: *Deleted

## 2020-10-11 ENCOUNTER — Encounter: Payer: Self-pay | Admitting: Cardiovascular Disease

## 2020-10-11 ENCOUNTER — Ambulatory Visit (INDEPENDENT_AMBULATORY_CARE_PROVIDER_SITE_OTHER): Payer: Medicare Other | Admitting: Cardiovascular Disease

## 2020-10-11 VITALS — BP 122/80 | HR 78 | Ht 70.0 in | Wt 203.6 lb

## 2020-10-11 DIAGNOSIS — Z952 Presence of prosthetic heart valve: Secondary | ICD-10-CM | POA: Diagnosis not present

## 2020-10-11 DIAGNOSIS — C9110 Chronic lymphocytic leukemia of B-cell type not having achieved remission: Secondary | ICD-10-CM

## 2020-10-11 DIAGNOSIS — Z5181 Encounter for therapeutic drug level monitoring: Secondary | ICD-10-CM | POA: Diagnosis not present

## 2020-10-11 DIAGNOSIS — I2699 Other pulmonary embolism without acute cor pulmonale: Secondary | ICD-10-CM

## 2020-10-11 DIAGNOSIS — I480 Paroxysmal atrial fibrillation: Secondary | ICD-10-CM | POA: Diagnosis not present

## 2020-10-11 LAB — POCT INR: INR: 2.2 (ref 2.0–3.0)

## 2020-10-11 NOTE — Patient Instructions (Addendum)
Medication Instructions:  *If you need a refill on your cardiac medications before your next appointment, please call your pharmacy*  Lab Work: If you have labs (blood work) drawn today and your tests are completely normal, you will receive your results only by: Marland Kitchen MyChart Message (if you have MyChart) OR . A paper copy in the mail If you have any lab test that is abnormal or we need to change your treatment, we will call you to review the results.  Testing/Procedures: Your physician has requested that you have an echocardiogram in February. Echocardiography is a painless test that uses sound waves to create images of your heart. It provides your doctor with information about the size and shape of your heart and how well your heart's chambers and valves are working. This procedure takes approximately one hour. There are no restrictions for this procedure.  Follow-Up: At [facility], you and your health needs are our priority.  As part of our continuing mission to provide you with exceptional heart care, we have created designated Provider Care Teams.  These Care Teams include your primary Cardiologist (physician) and Advanced Practice Providers (APPs -  Physician Assistants and Nurse Practitioners) who all work together to provide you with the care you need, when you need it.  We recommend signing up for the patient portal called "MyChart".  Sign up information is provided on this After Visit Summary.  MyChart is used to connect with patients for Virtual Visits (Telemedicine).  Patients are able to view lab/test results, encounter notes, upcoming appointments, etc.  Non-urgent messages can be sent to your provider as well.   To learn more about what you can do with MyChart, go to [email or web address].    Your next appointment:   6 month(s)  The format for your next appointment:   In Person  Provider:   You may see Jenkins Rouge, MD or one of the following Advanced Practice Providers on  your designated Care Team:    Truitt Merle, NP  Cecilie Kicks, NP  Kathyrn Drown, NP

## 2020-10-11 NOTE — Patient Instructions (Signed)
Description   Today take 2 tablets then continue taking 1.5 tablets daily except 2 tablets on Mondays and Fridays. Recheck INR in 4 weeks. Please call with any questions or concerns please call [facility] [phone] Main [phone]

## 2020-10-13 ENCOUNTER — Telehealth: Payer: Self-pay | Admitting: Cardiovascular Disease

## 2020-10-13 NOTE — Telephone Encounter (Signed)
Tried to call patient back. No answer and voicemail was full.  

## 2020-10-13 NOTE — Telephone Encounter (Signed)
New Message:   He would like a call from Dr Kyla Balzarine nurse, concerning his appt with the ENT doctor.

## 2020-10-14 ENCOUNTER — Other Ambulatory Visit: Payer: Self-pay | Admitting: Family Medicine

## 2020-10-16 NOTE — Telephone Encounter (Signed)
LR: 07-19-2020 Qty: 30 with 2 refills  Last office visit: 10-04-2020 (acute visit with [facility]) 05-11-2020( cpe with Dr. Yong Channel)  Upcoming appointment: 11-13-2020

## 2020-10-18 ENCOUNTER — Ambulatory Visit (HOSPITAL_BASED_OUTPATIENT_CLINIC_OR_DEPARTMENT_OTHER)
Admission: RE | Admit: 2020-10-18 | Discharge: 2020-10-18 | Disposition: A | Discharge status: Home / Self Care | Payer: Medicare Other | Source: Ambulatory Visit | Attending: Hematology & Oncology | Admitting: Hematology & Oncology

## 2020-10-18 ENCOUNTER — Encounter (HOSPITAL_BASED_OUTPATIENT_CLINIC_OR_DEPARTMENT_OTHER): Payer: Self-pay

## 2020-10-18 ENCOUNTER — Other Ambulatory Visit: Payer: Self-pay

## 2020-10-18 ENCOUNTER — Encounter: Payer: Self-pay | Admitting: Hematology & Oncology

## 2020-10-18 DIAGNOSIS — C9110 Chronic lymphocytic leukemia of B-cell type not having achieved remission: Secondary | ICD-10-CM | POA: Insufficient documentation

## 2020-10-18 MED ORDER — IOHEXOL 300 MG/ML  SOLN
100.0000 mL | Freq: Once | INTRAMUSCULAR | Status: AC | PRN
  Administered 2020-10-18: 80 mL via INTRAVENOUS

## 2020-10-27 ENCOUNTER — Other Ambulatory Visit: Payer: Self-pay | Admitting: *Deleted

## 2020-10-27 ENCOUNTER — Inpatient Hospital Stay: Payer: Medicare Other | Attending: Hematology & Oncology

## 2020-10-27 ENCOUNTER — Inpatient Hospital Stay (HOSPITAL_BASED_OUTPATIENT_CLINIC_OR_DEPARTMENT_OTHER): Payer: Medicare Other | Admitting: Hematology & Oncology

## 2020-10-27 ENCOUNTER — Encounter: Payer: Self-pay | Admitting: Hematology & Oncology

## 2020-10-27 ENCOUNTER — Other Ambulatory Visit: Payer: Self-pay

## 2020-10-27 VITALS — BP 132/83 | HR 63 | Temp 98.8°F | Resp 18 | Wt 200.0 lb

## 2020-10-27 DIAGNOSIS — C9110 Chronic lymphocytic leukemia of B-cell type not having achieved remission: Secondary | ICD-10-CM | POA: Diagnosis not present

## 2020-10-27 DIAGNOSIS — Z7901 Long term (current) use of anticoagulants: Secondary | ICD-10-CM | POA: Insufficient documentation

## 2020-10-27 DIAGNOSIS — I2699 Other pulmonary embolism without acute cor pulmonale: Secondary | ICD-10-CM | POA: Insufficient documentation

## 2020-10-27 DIAGNOSIS — R911 Solitary pulmonary nodule: Secondary | ICD-10-CM | POA: Insufficient documentation

## 2020-10-27 DIAGNOSIS — Z79899 Other long term (current) drug therapy: Secondary | ICD-10-CM | POA: Insufficient documentation

## 2020-10-27 LAB — CBC WITH DIFFERENTIAL (CANCER CENTER ONLY)
Abs Immature Granulocytes: 0.15 10*3/uL — ABNORMAL HIGH (ref 0.00–0.07)
Basophils Absolute: 0 10*3/uL (ref 0.0–0.1)
Basophils Relative: 0 %
Eosinophils Absolute: 0 10*3/uL (ref 0.0–0.5)
Eosinophils Relative: 0 %
HCT: 38.9 % — ABNORMAL LOW (ref 39.0–52.0)
Hemoglobin: 12.6 g/dL — ABNORMAL LOW (ref 13.0–17.0)
Immature Granulocytes: 4 %
Lymphocytes Relative: 40 %
Lymphs Abs: 1.7 10*3/uL (ref 0.7–4.0)
MCH: 32.2 pg (ref 26.0–34.0)
MCHC: 32.4 g/dL (ref 30.0–36.0)
MCV: 99.5 fL (ref 80.0–100.0)
Monocytes Absolute: 0.6 10*3/uL (ref 0.1–1.0)
Monocytes Relative: 15 %
Neutro Abs: 1.7 10*3/uL (ref 1.7–7.7)
Neutrophils Relative %: 41 %
Platelet Count: 138 10*3/uL — ABNORMAL LOW (ref 150–400)
RBC: 3.91 MIL/uL — ABNORMAL LOW (ref 4.22–5.81)
RDW: 12.6 % (ref 11.5–15.5)
WBC Count: 4.2 10*3/uL (ref 4.0–10.5)
nRBC: 0 % (ref 0.0–0.2)

## 2020-10-27 LAB — CMP (CANCER CENTER ONLY)
ALT: 13 U/L (ref 0–44)
AST: 17 U/L (ref 15–41)
Albumin: 4.4 g/dL (ref 3.5–5.0)
Alkaline Phosphatase: 48 U/L (ref 38–126)
Anion gap: 6 (ref 5–15)
BUN: 27 mg/dL — ABNORMAL HIGH (ref 8–23)
CO2: 30 mmol/L (ref 22–32)
Calcium: 9.7 mg/dL (ref 8.9–10.3)
Chloride: 101 mmol/L (ref 98–111)
Creatinine: 1.63 mg/dL — ABNORMAL HIGH (ref 0.61–1.24)
GFR, Estimated: 44 mL/min — ABNORMAL LOW (ref >60)
Glucose, Bld: 88 mg/dL (ref 70–99)
Potassium: 4.1 mmol/L (ref 3.5–5.1)
Sodium: 137 mmol/L (ref 135–145)
Total Bilirubin: 0.4 mg/dL (ref 0.3–1.2)
Total Protein: 6.8 g/dL (ref 6.5–8.1)

## 2020-10-27 LAB — SAVE SMEAR(SSMR), FOR PROVIDER SLIDE REVIEW

## 2020-10-27 LAB — LACTATE DEHYDROGENASE: LDH: 294 U/L — ABNORMAL HIGH (ref 98–192)

## 2020-10-27 MED ORDER — VENETOCLAX 100 MG PO TABS
200.0000 mg | ORAL_TABLET | Freq: Every day | ORAL | 3 refills | Status: DC
Start: 2020-10-27 — End: 2021-03-28

## 2020-10-27 NOTE — Progress Notes (Signed)
Hematology and Oncology Follow Up Visit  [name] [id number] [birth year] 74 y.o. 10/27/2020   Principle Diagnosis:   Right lower lobe pulmonary nodule- non-malignant  Recurrent pulmonary embolism  CLL -- progressive -- 11q-/13q-  Current Therapy:    Coumadin 5 mg po q day  Calquence 100 mg po BID -- start on 03/13/2020  Venetoclax 200 mg po q day -- start in August 2021       Interim History:  [name] is back for follow-up.  So far, he is done quite well with the venetoclax added to the acalabrutinib.  He recently seemed to have some type of issue with his throat.  He went to see ENT.  He was having some vocal issues.  Has had that he may have had reflux.  We did go ahead and do a CT scan of his body, including his neck.  CT of the neck showed nice improvement in his lymphadenopathy.  CT of the body showed an incredible resolution of most lymph node areas.  However there was a lymph node in the left celiac region that measured 2.1 x 1.7 cm.  Previously measured 1.1 x 0.8 cm.  I am not sure exactly what this means.  I will know if this is inflammatory if he had gastric reflux.  We will just have to watch this.  He has had no problems with diarrhea.  He has had no rashes.  He has had no bleeding.  He has had some left knee problems.  He had an injection into the left knee recently.  Of note, his percentage of lymphocytes continues to improve.  Today his percentage of lymphocytes is 40%.  He is walking quite a bit.  Sounds like he may get together with some family and friends this weekend and walk.  His dog is doing quite well.  His dog is a little over 1 year.  Overall, his performance status is ECOG 0.      Medications:  Current Outpatient Medications:  .  acalabrutinib (CALQUENCE) 100 MG capsule, Take 1 capsule (100 mg total) by mouth 2 (two) times daily., Disp: 60 capsule, Rfl: 6 .  allopurinol (ZYLOPRIM) 100 MG tablet, TAKE 1 TABLET BY MOUTH EVERY DAY, Disp:  90 tablet, Rfl: 0 .  amoxicillin (AMOXIL) 500 MG capsule, Take 500 mg by mouth 2 (two) times daily. Prior to dental procedures., Disp: , Rfl:  .  cholecalciferol (VITAMIN D) 1000 units tablet, Take 1,000 Units by mouth daily., Disp: , Rfl:  .  hydrochlorothiazide (HYDRODIURIL) 12.5 MG tablet, TAKE 1 TABLET BY MOUTH EVERY DAY, Disp: 90 tablet, Rfl: 1 .  ipratropium (ATROVENT) 0.03 % nasal spray, Place 2 sprays into both nostrils every 12 (twelve) hours., Disp: 30 mL, Rfl: 12 .  LORazepam (ATIVAN) 0.5 MG tablet, TAKE 1 TABLET BY MOUTH EVERY DAY AS NEEDED FOR ANXIETY, Disp: 30 tablet, Rfl: 2 .  meclizine (ANTIVERT) 12.5 MG tablet, TAKE 1 TABLET EVERY DAY AS NEEDED FOR DIZZINESS, Disp: 30 tablet, Rfl: 2 .  omeprazole (PRILOSEC) 40 MG capsule, Take 40 mg by mouth daily., Disp: , Rfl:  .  rosuvastatin (CRESTOR) 10 MG tablet, TAKE 1 TABLET BY MOUTH EVERY DAY, Disp: 90 tablet, Rfl: 3 .  sertraline (ZOLOFT) 100 MG tablet, Take 100 mg by mouth daily., Disp: , Rfl:  .  venetoclax 100 MG TABS, Take 200 mg by mouth daily., Disp: 60 tablet, Rfl: 3 .  warfarin (COUMADIN) 5 MG tablet, TAKE AS DIRECTED BY  COUMADIN CLINIC, Disp: 160 tablet, Rfl: 1  Allergies:  Allergies  Allergen Reactions  . Hydrocodone-Acetaminophen Shortness Of Breath  . Oxycodone Hcl Shortness Of Breath  . Telmisartan-Hctz Shortness Of Breath and Palpitations    Dizziness, too  . Ramipril   . Aspirin Other (See Comments) and Rash    Other Reaction: confusion Can't take high dose  . Atorvastatin Other (See Comments)    myalgia  . Buspirone Hcl Other (See Comments)    headache  . Codeine Nausea Only and Other (See Comments)    Other Reaction: confusion  . Codeine Phosphate Itching and Nausea Only  . Morphine Sulfate Itching  . Paroxetine Other (See Comments)    Paresthesias: R arm, R leg, r side of face  . Rabeprazole Other (See Comments)    headache    Past Medical History, Surgical history, Social history, and Family History  were reviewed and updated.  Review of Systems: Review of Systems  Constitutional: Negative.   HENT: Negative.   Eyes: Negative.   Respiratory: Negative.   Cardiovascular: Negative.   Gastrointestinal: Negative.   Genitourinary: Negative.   Musculoskeletal: Negative.   Skin: Negative.   Neurological: Negative.   Endo/Heme/Allergies: Negative.   Psychiatric/Behavioral: Negative.     Physical Exam:  weight is 200 lb (90.7 kg). His oral temperature is 98.8 F (37.1 C). His blood pressure is 132/83 and his pulse is 63. His respiration is 18 and oxygen saturation is 98%.   Physical Exam Vitals reviewed.  Constitutional:      Comments: Axillary exam does not show any palpable adenopathy in either axilla. Marland Kitchen    HENT:     Head: Normocephalic and atraumatic.  Eyes:     Pupils: Pupils are equal, round, and reactive to light.  Neck:     Comments: On his neck exam, he has had nice regression of his lymphadenopathy.  I cannot palpate any adenopathy in the cervical or supraclavicular chains. Cardiovascular:     Rate and Rhythm: Normal rate and regular rhythm.     Heart sounds: Normal heart sounds.  Pulmonary:     Effort: Pulmonary effort is normal.     Breath sounds: Normal breath sounds.  Abdominal:     General: Bowel sounds are normal.     Palpations: Abdomen is soft.  Musculoskeletal:        General: No tenderness or deformity. Normal range of motion.     Cervical back: Normal range of motion.  Lymphadenopathy:     Cervical: No cervical adenopathy.  Skin:    General: Skin is warm and dry.     Findings: No erythema or rash.  Neurological:     Mental Status: He is alert and oriented to person, place, and time.  Psychiatric:        Behavior: Behavior normal.        Thought Content: Thought content normal.        Judgment: Judgment normal.     Lab Results  Component Value Date   WBC 4.2 10/27/2020   HGB 12.6 (L) 10/27/2020   HCT 38.9 (L) 10/27/2020   MCV 99.5 10/27/2020    PLT 138 (L) 10/27/2020     Chemistry      Component Value Date/Time   NA 137 10/27/2020 0927   NA 145 07/22/2017 0756   NA 139 06/27/2016 1410   K 4.1 10/27/2020 0927   K 4.3 07/22/2017 0756   K 5.1 06/27/2016 1410   CL 101 10/27/2020 3875  CL 105 07/22/2017 0756   CO2 30 10/27/2020 0927   CO2 30 07/22/2017 0756   CO2 28 06/27/2016 1410   BUN 27 (H) 10/27/2020 0927   BUN 17 07/22/2017 0756   BUN 20.9 06/27/2016 1410   CREATININE 1.63 (H) 10/27/2020 0927   CREATININE 1.6 (H) 07/22/2017 0756   CREATININE 1.6 (H) 06/27/2016 1410      Component Value Date/Time   CALCIUM 9.7 10/27/2020 0927   CALCIUM 9.6 07/22/2017 0756   CALCIUM 9.6 06/27/2016 1410   ALKPHOS 48 10/27/2020 0927   ALKPHOS 59 07/22/2017 0756   ALKPHOS 62 06/27/2016 1410   AST 17 10/27/2020 0927   AST 18 06/27/2016 1410   ALT 13 10/27/2020 0927   ALT 24 07/22/2017 0756   ALT 24 06/27/2016 1410   BILITOT 0.4 10/27/2020 0927   BILITOT 0.46 06/27/2016 1410      Impression and Plan: [name] is 74 year-old African-American gentleman.  He has recurrent CLL.    He now is on combination oral therapy.  He is doing well.  His white cell count has come down quite nicely.  The percentage of lymphocytes is also coming down nicely.  We can have to be cognizant of this left celiac node.  I probably would repeat a scan in another 3 months.  It is possible that this note might be a little bit more aggressive and we might need to consider some kind of change or possibly radiation to this area.  I would like to see [name] back in another 6 weeks.   Marland KitchenVolanda Napoleon, MD 12/17/202110:11 AM

## 2020-10-28 LAB — IGG, IGA, IGM
IgA: 104 mg/dL (ref 61–437)
IgG (Immunoglobin G), Serum: 739 mg/dL (ref 603–1613)
IgM (Immunoglobulin M), Srm: 61 mg/dL (ref 15–143)

## 2020-11-01 ENCOUNTER — Other Ambulatory Visit: Payer: Self-pay | Admitting: Family Medicine

## 2020-11-01 DIAGNOSIS — E7800 Pure hypercholesterolemia, unspecified: Secondary | ICD-10-CM

## 2020-11-02 ENCOUNTER — Ambulatory Visit: Payer: Medicare Other | Admitting: Psychology

## 2020-11-08 ENCOUNTER — Ambulatory Visit (INDEPENDENT_AMBULATORY_CARE_PROVIDER_SITE_OTHER): Payer: No Typology Code available for payment source

## 2020-11-08 ENCOUNTER — Other Ambulatory Visit: Payer: Self-pay

## 2020-11-08 DIAGNOSIS — I480 Paroxysmal atrial fibrillation: Secondary | ICD-10-CM | POA: Diagnosis not present

## 2020-11-08 DIAGNOSIS — I2699 Other pulmonary embolism without acute cor pulmonale: Secondary | ICD-10-CM | POA: Diagnosis not present

## 2020-11-08 DIAGNOSIS — Z5181 Encounter for therapeutic drug level monitoring: Secondary | ICD-10-CM

## 2020-11-08 DIAGNOSIS — Z952 Presence of prosthetic heart valve: Secondary | ICD-10-CM

## 2020-11-08 LAB — POCT INR: INR: 3 (ref 2.0–3.0)

## 2020-11-08 NOTE — Patient Instructions (Signed)
Description   Continue on same dosage 1.5 tablets daily except 2 tablets on Mondays and Fridays. Recheck INR in 4 weeks. Please call with any questions or concerns please call Coumadin Clinic [phone] Main [phone]

## 2020-11-11 NOTE — Patient Instructions (Addendum)
Great to see you!   Glad you are doing well  Skip labs today as you just had with oncology

## 2020-11-11 NOTE — Progress Notes (Signed)
Phone [phone] In person visit   Subjective:   [name] is a 75 y.o. year old very pleasant male patient who presents for/with See problem oriented charting Chief Complaint  Patient presents with  . Hyperlipidemia  . Hypertension   This visit occurred during the SARS-CoV-2 public health emergency.  Safety protocols were in place, including screening questions prior to the visit, additional usage of staff PPE, and extensive cleaning of exam room while observing appropriate contact time as indicated for disinfecting solutions.   Past Medical History-  Patient Active Problem List   Diagnosis Date Noted  . S/P Bioprosthetic AVR (aortic valve replacement) in 2011 03/22/2015    Priority: High  . Recurrent pulmonary embolism ([place]) 11/09/2014    Priority: High  . Chronic lymphocytic leukemia ([place]) 11/11/2013    Priority: High  . Atrial fibrillation ([place]) 04/20/2007    Priority: High  . PROSTATE CANCER, HX OF 04/20/2007    Priority: High  . PTSD (post-traumatic stress disorder) 09/29/2018    Priority: Medium  . BPPV (benign paroxysmal positional vertigo) 09/04/2017    Priority: Medium  . CKD (chronic kidney disease), stage III ([place]) 08/11/2017    Priority: Medium  . Cervical spondylosis     Priority: Medium  . Migraine 10/08/2010    Priority: Medium  . Insomnia 02/03/2008    Priority: Medium  . Anxiety state 09/06/2007    Priority: Medium  . HLD (hyperlipidemia) 04/20/2007    Priority: Medium  . Essential hypertension 04/20/2007    Priority: Medium  . Former smoker 08/29/2016    Priority: Low  . Lung nodule 11/02/2014    Priority: Low  . GERD 12/11/2009    Priority: Low  . MITRAL REGURGITATION 04/07/2009    Priority: Low  . Encounter for therapeutic drug monitoring 09/21/2019    Medications- reviewed and updated Current Outpatient Medications  Medication Sig Dispense Refill  . acalabrutinib (CALQUENCE) 100 MG capsule Take 1 capsule (100 mg total) by mouth  2 (two) times daily. 60 capsule 6  . allopurinol (ZYLOPRIM) 100 MG tablet TAKE 1 TABLET BY MOUTH EVERY DAY 90 tablet 0  . cholecalciferol (VITAMIN D) 1000 units tablet Take 1,000 Units by mouth daily.    . hydrochlorothiazide (HYDRODIURIL) 12.5 MG tablet TAKE 1 TABLET BY MOUTH EVERY DAY 90 tablet 1  . ipratropium (ATROVENT) 0.03 % nasal spray Place 2 sprays into both nostrils every 12 (twelve) hours. 30 mL 12  . LORazepam (ATIVAN) 0.5 MG tablet TAKE 1 TABLET BY MOUTH EVERY DAY AS NEEDED FOR ANXIETY 30 tablet 2  . meclizine (ANTIVERT) 12.5 MG tablet TAKE 1 TABLET EVERY DAY AS NEEDED FOR DIZZINESS 30 tablet 2  . rosuvastatin (CRESTOR) 10 MG tablet TAKE 1 TABLET BY MOUTH EVERY DAY 90 tablet 3  . sertraline (ZOLOFT) 100 MG tablet Take 100 mg by mouth daily.    Marland Kitchen venetoclax (VENCLEXTA) 100 MG tablet Take 2 tablets (200 mg total) by mouth daily. 60 tablet 3  . warfarin (COUMADIN) 5 MG tablet TAKE AS DIRECTED BY COUMADIN CLINIC 160 tablet 1  . amoxicillin (AMOXIL) 500 MG capsule Take 500 mg by mouth 2 (two) times daily. Prior to dental procedures. (Patient not taking: Reported on 11/13/2020)    . omeprazole (PRILOSEC) 40 MG capsule Take 40 mg by mouth daily. (Patient not taking: Reported on 11/13/2020)     No current facility-administered medications for this visit.     Objective:  BP 118/78   Pulse 67   Temp 98.6  F (37 C) (Temporal)   Ht 5\' 10"  (1.778 m)   Wt 201 lb 12.8 oz (91.5 kg)   SpO2 96%   BMI 28.96 kg/m  Gen: NAD, resting comfortably, appears younger than stated age CV: RRR no murmurs rubs or gallops Lungs: CTAB no crackles, wheeze, rhonchi Abdomen: soft/nontender/nondistended/normal bowel sounds. No rebound or guarding.  Ext: no edema Skin: warm, dry     Assessment and Plan   # CLL- resistant to treatment now on venetoclax in addition to calquence - numbers looking much better and has 6 month follow up.  Lab Results  Component Value Date   WBC 4.2 10/27/2020   HGB 12.6 (L)  10/27/2020   HCT 38.9 (L) 10/27/2020   MCV 99.5 10/27/2020   PLT 138 (L) 10/27/2020    #hyperlipidemia S: Medication: Crestor 10Mg  daily now Lab Results  Component Value Date   CHOL 181 05/12/2020   HDL 48.50 05/12/2020   LDLCALC 110 (H) 05/12/2020   LDLDIRECT 105.0 09/29/2018   TRIG 114.0 05/12/2020   CHOLHDL 4 05/12/2020   A/P: reasonable control with LDL down from peak over 200- recheck next visit  # Anxiety S:Medication: sertraline 100mg , Ativan 0.5Mg - can cut in half- about 20 per month- pretty stable Counseling: working with his therapists still  A/P:  Reasonable control with current meds- continue current medication. Advised no driving for 8 hours after taking   # GERD- saw ENT- advised to cut back on coffee and spicy foods S:Medication: Prilosec 40Mg  A/P: doing well. Continue current meds. May need to looking at reducing dose at next visit.    #hypertension/CKD III S: medication: HCTZ 12.5Mg  BP Readings from Last 3 Encounters:  11/13/20 118/78  10/27/20 132/83  10/11/20 122/80  A/P: HTN well controlled- continue current meds. CKD III stable on last labs. Encouraged to stay well hydrated  #Changed to Coumadin by cardiology due to thrombus on AVR- doing well with this. This also treates recurrent PE- stable. contineu current meds. Also treates stable a fib- rate controlled without meds- continue current meds  #social update- enjoying his dog Camillia Herter- just over a year old now- he showed me pictures- beautiful dog.   Really wants to travel potentially to [place] but doesn't enjoy flying and wants tensions to settle before flying  Recommended follow up: Return in about 6 months (around 05/13/2021) for physical or sooner if needed. Future Appointments  Date Time Provider [place]  11/30/2020  1:00 PM Oren Binet, PhD [facility] None  12/06/2020  1:30 PM [facility]  12/08/2020  2:30 PM [facility] None  12/08/2020   3:00 PM Ennever, Rudell Cobb, MD [facility] None  12/13/2020  1:05 PM [facility]  12/28/2020  1:00 PM Oren Binet, PhD [facility] None  01/25/2021  1:00 PM Oren Binet, PhD [facility] None  02/22/2021  1:00 PM Oren Binet, PhD [facility] None  03/22/2021  1:00 PM Oren Binet, PhD [facility] None  04/19/2021  1:00 PM Oren Binet, PhD [facility] None  05/01/2021  2:15 PM Josue Hector, MD [facility]  05/17/2021  1:00 PM Oren Binet, PhD [facility] None  05/18/2021  1:20 PM [name], MD [facility]  06/14/2021  1:00 PM Oren Binet, PhD [facility] None    Lab/Order associations:   ICD-10-CM   1. Essential hypertension  I10   2. Anxiety state  F41.1   3. Hyperlipidemia, unspecified hyperlipidemia type  E78.5  4. Gastroesophageal reflux disease without esophagitis  K21.9   5. Chronic lymphocytic leukemia (HCC)  C91.10   6. Recurrent pulmonary embolism (HCC)  I26.99   7. Paroxysmal atrial fibrillation (HCC)  I48.0   8. Stage 3 chronic kidney disease, unspecified whether stage 3a or 3b CKD (HCC)  N18.30     Return precautions advised.  Tana Conch, MD

## 2020-11-13 ENCOUNTER — Ambulatory Visit (INDEPENDENT_AMBULATORY_CARE_PROVIDER_SITE_OTHER): Payer: No Typology Code available for payment source | Admitting: Family Medicine

## 2020-11-13 ENCOUNTER — Encounter: Payer: Self-pay | Admitting: Family Medicine

## 2020-11-13 ENCOUNTER — Other Ambulatory Visit: Payer: Self-pay

## 2020-11-13 VITALS — BP 118/78 | HR 67 | Temp 98.6°F | Ht 70.0 in | Wt 201.8 lb

## 2020-11-13 DIAGNOSIS — E785 Hyperlipidemia, unspecified: Secondary | ICD-10-CM

## 2020-11-13 DIAGNOSIS — K219 Gastro-esophageal reflux disease without esophagitis: Secondary | ICD-10-CM | POA: Diagnosis not present

## 2020-11-13 DIAGNOSIS — I2699 Other pulmonary embolism without acute cor pulmonale: Secondary | ICD-10-CM

## 2020-11-13 DIAGNOSIS — I10 Essential (primary) hypertension: Secondary | ICD-10-CM

## 2020-11-13 DIAGNOSIS — F411 Generalized anxiety disorder: Secondary | ICD-10-CM

## 2020-11-13 DIAGNOSIS — I480 Paroxysmal atrial fibrillation: Secondary | ICD-10-CM

## 2020-11-13 DIAGNOSIS — C9110 Chronic lymphocytic leukemia of B-cell type not having achieved remission: Secondary | ICD-10-CM

## 2020-11-13 DIAGNOSIS — N183 Chronic kidney disease, stage 3 unspecified: Secondary | ICD-10-CM

## 2020-11-15 ENCOUNTER — Encounter: Payer: Self-pay | Admitting: Hematology & Oncology

## 2020-11-22 ENCOUNTER — Encounter: Payer: Self-pay | Admitting: Family Medicine

## 2020-11-27 ENCOUNTER — Encounter: Payer: Self-pay | Admitting: Hematology & Oncology

## 2020-11-27 ENCOUNTER — Other Ambulatory Visit: Payer: Self-pay | Admitting: *Deleted

## 2020-11-27 MED ORDER — ACALABRUTINIB 100 MG PO CAPS: 100.0000 mg | ORAL_CAPSULE | Freq: Two times a day (BID) | ORAL | 6 refills | Status: DC

## 2020-11-27 NOTE — Progress Notes (Signed)
Phone [phone] In person visit   Subjective:   [name] is a 75 y.o. year old very pleasant male patient who presents for/with See problem oriented charting Chief Complaint  Patient presents with  . Leg Pain    Right leg, pain started on 1/10 - mild pain in hip and knee during the day, worsening pain while laying in bed at night - has increased water intake and elevates leg on a pillow   This visit occurred during the SARS-CoV-2 public health emergency.  Safety protocols were in place, including screening questions prior to the visit, additional usage of staff PPE, and extensive cleaning of exam room while observing appropriate contact time as indicated for disinfecting solutions.   Past Medical History-  Patient Active Problem List   Diagnosis Date Noted  . S/P Bioprosthetic AVR (aortic valve replacement) in 2011 03/22/2015    Priority: High  . Recurrent pulmonary embolism ([place]) 11/09/2014    Priority: High  . Chronic lymphocytic leukemia ([place]) 11/11/2013    Priority: High  . Atrial fibrillation ([place]) 04/20/2007    Priority: High  . PROSTATE CANCER, HX OF 04/20/2007    Priority: High  . PTSD (post-traumatic stress disorder) 09/29/2018    Priority: Medium  . BPPV (benign paroxysmal positional vertigo) 09/04/2017    Priority: Medium  . CKD (chronic kidney disease), stage III ([place]) 08/11/2017    Priority: Medium  . Cervical spondylosis     Priority: Medium  . Migraine 10/08/2010    Priority: Medium  . Insomnia 02/03/2008    Priority: Medium  . Anxiety state 09/06/2007    Priority: Medium  . HLD (hyperlipidemia) 04/20/2007    Priority: Medium  . Essential hypertension 04/20/2007    Priority: Medium  . Former smoker 08/29/2016    Priority: Low  . Lung nodule 11/02/2014    Priority: Low  . GERD 12/11/2009    Priority: Low  . MITRAL REGURGITATION 04/07/2009    Priority: Low  . Encounter for therapeutic drug monitoring 09/21/2019    Medications- reviewed  and updated Current Outpatient Medications  Medication Sig Dispense Refill  . acalabrutinib (CALQUENCE) 100 MG capsule Take 1 capsule (100 mg total) by mouth 2 (two) times daily. 60 capsule 6  . allopurinol (ZYLOPRIM) 100 MG tablet TAKE 1 TABLET BY MOUTH EVERY DAY 90 tablet 0  . amoxicillin (AMOXIL) 500 MG capsule Take 500 mg by mouth 2 (two) times daily. Prior to dental procedures.    . cholecalciferol (VITAMIN D) 1000 units tablet Take 1,000 Units by mouth daily.    . hydrochlorothiazide (HYDRODIURIL) 12.5 MG tablet TAKE 1 TABLET BY MOUTH EVERY DAY 90 tablet 1  . ipratropium (ATROVENT) 0.03 % nasal spray Place 2 sprays into both nostrils every 12 (twelve) hours. 30 mL 12  . LORazepam (ATIVAN) 0.5 MG tablet TAKE 1 TABLET BY MOUTH EVERY DAY AS NEEDED FOR ANXIETY 30 tablet 2  . meclizine (ANTIVERT) 12.5 MG tablet TAKE 1 TABLET EVERY DAY AS NEEDED FOR DIZZINESS 30 tablet 2  . omeprazole (PRILOSEC) 40 MG capsule Take 40 mg by mouth daily.    . rosuvastatin (CRESTOR) 10 MG tablet TAKE 1 TABLET BY MOUTH EVERY DAY 90 tablet 3  . sertraline (ZOLOFT) 100 MG tablet Take 100 mg by mouth daily.    Marland Kitchen venetoclax (VENCLEXTA) 100 MG tablet Take 2 tablets (200 mg total) by mouth daily. 60 tablet 3  . warfarin (COUMADIN) 5 MG tablet TAKE AS DIRECTED BY COUMADIN CLINIC 160 tablet 1  No current facility-administered medications for this visit.     Objective:  BP 124/78   Pulse 74   Temp 97.8 F (36.6 C) (Temporal)   Wt 199 lb 3.2 oz (90.4 kg)   SpO2 95%   BMI 28.58 kg/m  Gen: NAD, resting comfortably CV: RRR no murmurs rubs or gallops Lungs: CTAB no crackles, wheeze, rhonchi Abdomen: soft/nontender/nondistended/normal bowel sounds. No rebound or guarding.  Ext: no edema in either leg- varicose veins noted- slightly more prominent on the right Knee exam largely reassuring other than some crepitus- ligaments and meniscus appears stable Skin: warm, dry, some varicose veins noted     Assessment and  Plan  Right Leg (primarily knee) Pain S: Patient started with right leg pain on January 10, mild pain in the right hip and knee during the day, pain worsens while laying in bed at night.   Pain on first night up to 10/10 (he says actually beyond) and then recurred on night 2- in and below the knee. He has tried increasing hydration and elevating legs on a pillow without significant relief. No pain now but feeling stiff at night. No significant issues in daytime- exercise actually improves this. Also having some right lateral hip pain. Changing sleep position did seem to help- normally right leg underneath with left leg pulled up like fetal position on left leg only. Now sleeping flat on back with right leg elevated. No fever or chills. No calf pain or swelling. Felt almost line bone was grinding.   History of foot reconstruction due to drop foot on right foot in April 2020 and didn't do PT bc of pandemic. Still gets stiffness at the ankle. He is in ongoing PT since before Christmas and that is helping foot as well as knee some.   Has tried heat A/P: right leg pain mainly around the knee but some onto tibia/shin. History of right foot reconstruction and has had some ankle/hip issues- ankle issues may have triggered/tweaked knee and or hip. Exam today largely reassuring. Will get x-ray to rule out bony abnormality especially with cancer history (prostate and CLL) . Last week had severe pain x 2 nights but now only mild. Will try Voltaren otc/heat as treatment.  Does have some varicose veins but doubt source of pain- can try compression stockings. If new or worsening symptoms likely refer to [facility]. Doubt dvt/PE with no calf swelling and on coumadin.   Recommended follow up: as needed for acute concerns Future Appointments  Date Time Provider [place]  11/30/2020  1:00 PM Oren Binet, PhD [facility] None  12/06/2020  1:30 PM [facility]  12/08/2020  2:30 PM [facility] None  12/08/2020  3:00 PM Ennever, Rudell Cobb, MD [facility] None  12/13/2020  1:05 PM [facility]  12/28/2020  1:00 PM Oren Binet, PhD [facility] None  01/25/2021  1:00 PM Oren Binet, PhD [facility] None  02/22/2021  1:00 PM Oren Binet, PhD [facility] None  03/22/2021  1:00 PM Oren Binet, PhD [facility] None  04/19/2021  1:00 PM Oren Binet, PhD [facility] None  05/01/2021  2:15 PM Josue Hector, MD [facility]  05/17/2021  1:00 PM Oren Binet, PhD [facility] None  05/18/2021  1:20 PM [name], MD [facility]  06/14/2021  1:00 PM Oren Binet, PhD [facility] None    Lab/Order associations:   ICD-10-CM   1. Acute pain of  right knee  M25.561 DG Knee Complete 4 Views Right   Time Spent: 21 minutes of total time (4:00 PM- 4:21 PM) was spent on the date of the encounter performing the following actions: chart review prior to seeing the patient, obtaining history, performing a medically necessary exam, counseling on the treatment plan, placing orders, and documenting in our EHR.   Return precautions advised.  Garret Reddish, MD

## 2020-11-27 NOTE — Patient Instructions (Addendum)
Continue heat for the knee  Could also try topical voltaren gel  Continue to stay well hydrated  Try compression stockings but I am not confident that the varicose veins caused the pain  Please stop by x-ray before you go If you have mychart- we will send your results within 3 business days of [name] receiving them.  If you do not have mychart- we will call you about results within 5 business days of [name] receiving them.

## 2020-11-28 ENCOUNTER — Ambulatory Visit (INDEPENDENT_AMBULATORY_CARE_PROVIDER_SITE_OTHER): Payer: No Typology Code available for payment source | Admitting: Family Medicine

## 2020-11-28 ENCOUNTER — Ambulatory Visit (INDEPENDENT_AMBULATORY_CARE_PROVIDER_SITE_OTHER): Payer: No Typology Code available for payment source

## 2020-11-28 ENCOUNTER — Other Ambulatory Visit: Payer: Self-pay

## 2020-11-28 ENCOUNTER — Encounter: Payer: Self-pay | Admitting: Family Medicine

## 2020-11-28 VITALS — BP 124/78 | HR 74 | Temp 97.8°F | Wt 199.2 lb

## 2020-11-28 DIAGNOSIS — M25561 Pain in right knee: Secondary | ICD-10-CM | POA: Diagnosis not present

## 2020-11-29 ENCOUNTER — Encounter: Payer: Self-pay | Admitting: Family Medicine

## 2020-11-30 ENCOUNTER — Ambulatory Visit (INDEPENDENT_AMBULATORY_CARE_PROVIDER_SITE_OTHER): Payer: Medicare Other | Admitting: Psychology

## 2020-11-30 ENCOUNTER — Encounter: Payer: Self-pay | Admitting: Hematology & Oncology

## 2020-11-30 DIAGNOSIS — F4321 Adjustment disorder with depressed mood: Secondary | ICD-10-CM

## 2020-12-01 ENCOUNTER — Encounter: Payer: Self-pay | Admitting: Hematology & Oncology

## 2020-12-01 ENCOUNTER — Other Ambulatory Visit: Payer: Self-pay | Admitting: Family Medicine

## 2020-12-07 ENCOUNTER — Ambulatory Visit (INDEPENDENT_AMBULATORY_CARE_PROVIDER_SITE_OTHER): Payer: Medicare Other | Admitting: *Deleted

## 2020-12-07 ENCOUNTER — Other Ambulatory Visit: Payer: Self-pay

## 2020-12-07 DIAGNOSIS — I2699 Other pulmonary embolism without acute cor pulmonale: Secondary | ICD-10-CM | POA: Diagnosis not present

## 2020-12-07 DIAGNOSIS — I480 Paroxysmal atrial fibrillation: Secondary | ICD-10-CM | POA: Diagnosis not present

## 2020-12-07 DIAGNOSIS — Z5181 Encounter for therapeutic drug level monitoring: Secondary | ICD-10-CM

## 2020-12-07 DIAGNOSIS — Z952 Presence of prosthetic heart valve: Secondary | ICD-10-CM | POA: Diagnosis not present

## 2020-12-07 LAB — POCT INR: INR: 3.2 — AB (ref 2.0–3.0)

## 2020-12-07 NOTE — Patient Instructions (Signed)
Description   Take 1/2 a tablet today then continue on same dosage 1.5 tablets daily except 2 tablets on Mondays and Fridays. Recheck INR in 3 weeks. Please call with any questions or concerns please call [facility]madin Clinic [phone] Main [phone]

## 2020-12-08 ENCOUNTER — Inpatient Hospital Stay: Payer: Medicare Other | Admitting: Hematology & Oncology

## 2020-12-08 ENCOUNTER — Inpatient Hospital Stay: Payer: Medicare Other

## 2020-12-08 ENCOUNTER — Encounter: Payer: Self-pay | Admitting: Family Medicine

## 2020-12-08 ENCOUNTER — Encounter: Payer: Self-pay | Admitting: Hematology & Oncology

## 2020-12-12 ENCOUNTER — Encounter: Payer: Self-pay | Admitting: Hematology & Oncology

## 2020-12-12 ENCOUNTER — Encounter: Payer: Self-pay | Admitting: Family Medicine

## 2020-12-13 ENCOUNTER — Other Ambulatory Visit: Payer: Self-pay

## 2020-12-13 ENCOUNTER — Ambulatory Visit (HOSPITAL_COMMUNITY): Payer: Medicare Other | Attending: Cardiology

## 2020-12-13 DIAGNOSIS — Z952 Presence of prosthetic heart valve: Secondary | ICD-10-CM | POA: Insufficient documentation

## 2020-12-13 LAB — ECHOCARDIOGRAM COMPLETE
AR max vel: 1.02 cm2
AV Area VTI: 1.09 cm2
AV Area mean vel: 1.01 cm2
AV Mean grad: 27.5 mmHg
AV Peak grad: 46.2 mmHg
Ao pk vel: 3.4 m/s
Area-P 1/2: 1.69 cm2
S' Lateral: 3.8 cm

## 2020-12-14 ENCOUNTER — Inpatient Hospital Stay: Payer: Medicare Other | Attending: Hematology & Oncology

## 2020-12-14 ENCOUNTER — Inpatient Hospital Stay (HOSPITAL_BASED_OUTPATIENT_CLINIC_OR_DEPARTMENT_OTHER): Payer: Medicare Other | Admitting: Hematology & Oncology

## 2020-12-14 ENCOUNTER — Other Ambulatory Visit: Payer: Self-pay | Admitting: Hematology & Oncology

## 2020-12-14 ENCOUNTER — Telehealth: Payer: Self-pay

## 2020-12-14 ENCOUNTER — Encounter: Payer: Self-pay | Admitting: Hematology & Oncology

## 2020-12-14 VITALS — BP 122/93 | HR 70 | Temp 99.1°F | Resp 20 | Wt 204.1 lb

## 2020-12-14 DIAGNOSIS — I2699 Other pulmonary embolism without acute cor pulmonale: Secondary | ICD-10-CM | POA: Insufficient documentation

## 2020-12-14 DIAGNOSIS — Z7901 Long term (current) use of anticoagulants: Secondary | ICD-10-CM | POA: Diagnosis not present

## 2020-12-14 DIAGNOSIS — R911 Solitary pulmonary nodule: Secondary | ICD-10-CM | POA: Diagnosis not present

## 2020-12-14 DIAGNOSIS — C9110 Chronic lymphocytic leukemia of B-cell type not having achieved remission: Secondary | ICD-10-CM | POA: Diagnosis present

## 2020-12-14 DIAGNOSIS — Z79899 Other long term (current) drug therapy: Secondary | ICD-10-CM | POA: Diagnosis not present

## 2020-12-14 LAB — CMP (CANCER CENTER ONLY)
ALT: 14 U/L (ref 0–44)
AST: 18 U/L (ref 15–41)
Albumin: 4.3 g/dL (ref 3.5–5.0)
Alkaline Phosphatase: 58 U/L (ref 38–126)
Anion gap: 6 (ref 5–15)
BUN: 26 mg/dL — ABNORMAL HIGH (ref 8–23)
CO2: 30 mmol/L (ref 22–32)
Calcium: 9.5 mg/dL (ref 8.9–10.3)
Chloride: 101 mmol/L (ref 98–111)
Creatinine: 1.53 mg/dL — ABNORMAL HIGH (ref 0.61–1.24)
GFR, Estimated: 47 mL/min — ABNORMAL LOW (ref >60)
Glucose, Bld: 90 mg/dL (ref 70–99)
Potassium: 4.6 mmol/L (ref 3.5–5.1)
Sodium: 137 mmol/L (ref 135–145)
Total Bilirubin: 0.4 mg/dL (ref 0.3–1.2)
Total Protein: 6.2 g/dL — ABNORMAL LOW (ref 6.5–8.1)

## 2020-12-14 LAB — CBC WITH DIFFERENTIAL (CANCER CENTER ONLY)
Abs Immature Granulocytes: 0.02 10*3/uL (ref 0.00–0.07)
Basophils Absolute: 0 10*3/uL (ref 0.0–0.1)
Basophils Relative: 0 %
Eosinophils Absolute: 0 10*3/uL (ref 0.0–0.5)
Eosinophils Relative: 1 %
HCT: 37.8 % — ABNORMAL LOW (ref 39.0–52.0)
Hemoglobin: 12.5 g/dL — ABNORMAL LOW (ref 13.0–17.0)
Immature Granulocytes: 1 %
Lymphocytes Relative: 46 %
Lymphs Abs: 1.5 10*3/uL (ref 0.7–4.0)
MCH: 32.5 pg (ref 26.0–34.0)
MCHC: 33.1 g/dL (ref 30.0–36.0)
MCV: 98.2 fL (ref 80.0–100.0)
Monocytes Absolute: 0.5 10*3/uL (ref 0.1–1.0)
Monocytes Relative: 17 %
Neutro Abs: 1.1 10*3/uL — ABNORMAL LOW (ref 1.7–7.7)
Neutrophils Relative %: 35 %
Platelet Count: 112 10*3/uL — ABNORMAL LOW (ref 150–400)
RBC: 3.85 MIL/uL — ABNORMAL LOW (ref 4.22–5.81)
RDW: 13 % (ref 11.5–15.5)
WBC Count: 3.1 10*3/uL — ABNORMAL LOW (ref 4.0–10.5)
nRBC: 0 % (ref 0.0–0.2)

## 2020-12-14 LAB — SAVE SMEAR(SSMR), FOR PROVIDER SLIDE REVIEW

## 2020-12-14 LAB — LACTATE DEHYDROGENASE: LDH: 266 U/L — ABNORMAL HIGH (ref 98–192)

## 2020-12-14 NOTE — Progress Notes (Signed)
Hematology and Oncology Follow Up Visit  [name] [id number] [birth year] 75 y.o. 12/14/2020   Principle Diagnosis:   Right lower lobe pulmonary nodule- non-malignant  Recurrent pulmonary embolism  CLL -- progressive -- 11q-/13q-  Current Therapy:    Coumadin 5 mg po q day  Calquence 100 mg po BID -- start on 03/13/2020  Venetoclax 200 mg po q day -- start in August 2021     Interim History:  [name] is back for follow-up.  So far, he is done quite well with the venetoclax added to the acalabrutinib.  [facility].  I think the family was able to get it.  The big news that he will be going to [place] the end of of April for a seminar.  He is looking forward to this.  His dog is doing quite well.  I am very hopeful that I will be able to see his dog next time he comes in.  He has had no problems with nausea or vomiting.  He has had no issues with cough or shortness of breath.  He recently had echocardiogram done.  They are looking at his aortic valve.  He has moderate bioprosthetic valve stenosis.  There is no regurgitation.  He has a good left ventricular ejection fraction of 55-60%.    Overall, his performance status is ECOG 0.      Medications:  Current Outpatient Medications:  .  acalabrutinib (CALQUENCE) 100 MG capsule, Take 1 capsule (100 mg total) by mouth 2 (two) times daily., Disp: 60 capsule, Rfl: 6 .  allopurinol (ZYLOPRIM) 100 MG tablet, TAKE 1 TABLET BY MOUTH EVERY DAY, Disp: 90 tablet, Rfl: 0 .  cholecalciferol (VITAMIN D) 1000 units tablet, Take 1,000 Units by mouth daily., Disp: , Rfl:  .  hydrochlorothiazide (HYDRODIURIL) 12.5 MG tablet, TAKE 1 TABLET BY MOUTH EVERY DAY, Disp: 90 tablet, Rfl: 1 .  ipratropium (ATROVENT) 0.03 % nasal spray, Place 2 sprays into both nostrils every 12 (twelve) hours., Disp: 30 mL, Rfl: 12 .  LORazepam (ATIVAN) 0.5 MG tablet, TAKE 1 TABLET BY MOUTH EVERY DAY AS NEEDED  FOR ANXIETY, Disp: 30 tablet, Rfl: 2 .  Meclizine HCl 25 MG CHEW, TAKE 1 TABLET EVERY DAY AS NEEDED FOR DIZZINESS, Disp: 30 tablet, Rfl: 2 .  omeprazole (PRILOSEC) 40 MG capsule, Take 40 mg by mouth daily., Disp: , Rfl:  .  rosuvastatin (CRESTOR) 10 MG tablet, TAKE 1 TABLET BY MOUTH EVERY DAY, Disp: 90 tablet, Rfl: 3 .  senna (SENOKOT) 8.6 MG tablet, Take 2 tablets by mouth daily., Disp: , Rfl:  .  sertraline (ZOLOFT) 100 MG tablet, Take 100 mg by mouth daily., Disp: , Rfl:  .  venetoclax (VENCLEXTA) 100 MG tablet, Take 2 tablets (200 mg total) by mouth daily., Disp: 60 tablet, Rfl: 3 .  warfarin (COUMADIN) 5 MG tablet, TAKE AS DIRECTED BY COUMADIN CLINIC, Disp: 160 tablet, Rfl: 1 .  amoxicillin (AMOXIL) 500 MG capsule, Take 500 mg by mouth 2 (two) times daily. Prior to dental procedures. (Patient not taking: Reported on 12/14/2020), Disp: , Rfl:   Allergies:  Allergies  Allergen Reactions  . Hydrocodone-Acetaminophen Shortness Of Breath  . Oxycodone Hcl Shortness Of Breath  . Telmisartan-Hctz Shortness Of Breath and Palpitations    Dizziness, too  . Ramipril   . Aspirin Other (See Comments) and Rash    Other Reaction: confusion Can't take high dose  . Atorvastatin  Other (See Comments)    myalgia  . Buspirone Hcl Other (See Comments)    headache  . Codeine Nausea Only and Other (See Comments)    Other Reaction: confusion  . Codeine Phosphate Itching and Nausea Only  . Morphine Sulfate Itching  . Paroxetine Other (See Comments)    Paresthesias: R arm, R leg, r side of face  . Rabeprazole Other (See Comments)    headache    Past Medical History, Surgical history, Social history, and Family History were reviewed and updated.  Review of Systems: Review of Systems  Constitutional: Negative.   HENT: Negative.   Eyes: Negative.   Respiratory: Negative.   Cardiovascular: Negative.   Gastrointestinal: Negative.   Genitourinary: Negative.   Musculoskeletal: Negative.   Skin:  Negative.   Neurological: Negative.   Endo/Heme/Allergies: Negative.   Psychiatric/Behavioral: Negative.     Physical Exam:  weight is 204 lb 1.9 oz (92.6 kg). His oral temperature is 99.1 F (37.3 C). His blood pressure is 122/93 (abnormal) and his pulse is 70. His respiration is 20 and oxygen saturation is 99%.   Physical Exam Vitals reviewed.  Constitutional:      Comments: Axillary exam does not show any palpable adenopathy in either axilla. Marland Kitchen    HENT:     Head: Normocephalic and atraumatic.  Eyes:     Pupils: Pupils are equal, round, and reactive to light.  Neck:     Comments: On his neck exam, he has had nice regression of his lymphadenopathy.  I cannot palpate any adenopathy in the cervical or supraclavicular chains. Cardiovascular:     Rate and Rhythm: Normal rate and regular rhythm.     Heart sounds: Normal heart sounds.  Pulmonary:     Effort: Pulmonary effort is normal.     Breath sounds: Normal breath sounds.  Abdominal:     General: Bowel sounds are normal.     Palpations: Abdomen is soft.  Musculoskeletal:        General: No tenderness or deformity. Normal range of motion.     Cervical back: Normal range of motion.  Lymphadenopathy:     Cervical: No cervical adenopathy.  Skin:    General: Skin is warm and dry.     Findings: No erythema or rash.  Neurological:     Mental Status: He is alert and oriented to person, place, and time.  Psychiatric:        Behavior: Behavior normal.        Thought Content: Thought content normal.        Judgment: Judgment normal.     Lab Results  Component Value Date   WBC 3.1 (L) 12/14/2020   HGB 12.5 (L) 12/14/2020   HCT 37.8 (L) 12/14/2020   MCV 98.2 12/14/2020   PLT 112 (L) 12/14/2020     Chemistry      Component Value Date/Time   NA 137 12/14/2020 1255   NA 145 07/22/2017 0756   NA 139 06/27/2016 1410   K 4.6 12/14/2020 1255   K 4.3 07/22/2017 0756   K 5.1 06/27/2016 1410   CL 101 12/14/2020 1255   CL 105  07/22/2017 0756   CO2 30 12/14/2020 1255   CO2 30 07/22/2017 0756   CO2 28 06/27/2016 1410   BUN 26 (H) 12/14/2020 1255   BUN 17 07/22/2017 0756   BUN 20.9 06/27/2016 1410   CREATININE 1.53 (H) 12/14/2020 1255   CREATININE 1.6 (H) 07/22/2017 0756   CREATININE 1.6 (  H) 06/27/2016 1410      Component Value Date/Time   CALCIUM 9.5 12/14/2020 1255   CALCIUM 9.6 07/22/2017 0756   CALCIUM 9.6 06/27/2016 1410   ALKPHOS 58 12/14/2020 1255   ALKPHOS 59 07/22/2017 0756   ALKPHOS 62 06/27/2016 1410   AST 18 12/14/2020 1255   AST 18 06/27/2016 1410   ALT 14 12/14/2020 1255   ALT 24 07/22/2017 0756   ALT 24 06/27/2016 1410   BILITOT 0.4 12/14/2020 1255   BILITOT 0.46 06/27/2016 1410      Impression and Plan: [name] is 75 year-old African-American gentleman.  He has recurrent CLL.    He now is on combination oral therapy.  He is doing well.  His white cell count has come down quite nicely.   I would like to see [name] back in another 10 weeks.   Marland KitchenVolanda Napoleon, MD 2/3/20221:58 PM

## 2020-12-14 NOTE — Telephone Encounter (Signed)
appts made and pt aware of appt per 12/14/20 los   [name]

## 2020-12-15 LAB — BETA 2 MICROGLOBULIN, SERUM: Beta-2 Microglobulin: 2.6 mg/L — ABNORMAL HIGH (ref 0.6–2.4)

## 2020-12-20 ENCOUNTER — Encounter: Payer: Self-pay | Admitting: Family Medicine

## 2020-12-25 ENCOUNTER — Encounter: Payer: Self-pay | Admitting: Hematology & Oncology

## 2020-12-27 ENCOUNTER — Other Ambulatory Visit: Payer: Self-pay

## 2020-12-27 ENCOUNTER — Ambulatory Visit (INDEPENDENT_AMBULATORY_CARE_PROVIDER_SITE_OTHER): Payer: Medicare Other | Admitting: *Deleted

## 2020-12-27 DIAGNOSIS — Z5181 Encounter for therapeutic drug level monitoring: Secondary | ICD-10-CM

## 2020-12-27 DIAGNOSIS — I480 Paroxysmal atrial fibrillation: Secondary | ICD-10-CM

## 2020-12-27 DIAGNOSIS — I2699 Other pulmonary embolism without acute cor pulmonale: Secondary | ICD-10-CM

## 2020-12-27 DIAGNOSIS — Z952 Presence of prosthetic heart valve: Secondary | ICD-10-CM

## 2020-12-27 LAB — POCT INR: INR: 4.1 — AB (ref 2.0–3.0)

## 2020-12-27 NOTE — Patient Instructions (Signed)
Description   Do not take any Warfarin today then start taking 1.5 tablets daily except 2 tablets on Fridays. Recheck INR in 3 weeks. Please call with any questions or concerns please call Coumadin Clinic [phone] Main [phone]

## 2020-12-28 ENCOUNTER — Ambulatory Visit (INDEPENDENT_AMBULATORY_CARE_PROVIDER_SITE_OTHER): Payer: Medicare Other | Admitting: Psychology

## 2020-12-28 DIAGNOSIS — F4321 Adjustment disorder with depressed mood: Secondary | ICD-10-CM

## 2021-01-12 ENCOUNTER — Ambulatory Visit: Payer: Medicare Other

## 2021-01-15 ENCOUNTER — Ambulatory Visit (INDEPENDENT_AMBULATORY_CARE_PROVIDER_SITE_OTHER): Payer: Medicare Other

## 2021-01-15 ENCOUNTER — Other Ambulatory Visit: Payer: Self-pay

## 2021-01-15 DIAGNOSIS — Z0000 Encounter for general adult medical examination without abnormal findings: Secondary | ICD-10-CM

## 2021-01-15 NOTE — Patient Instructions (Addendum)
[name] , Thank you for taking time to come for your Medicare Wellness Visit. I appreciate your ongoing commitment to your health goals. Please review the following plan we discussed and let me know if I can assist you in the future.   Screening recommendations/referrals: Colonoscopy: Done 01/26/13 Recommended yearly ophthalmology/optometry visit for glaucoma screening and checkup Recommended yearly dental visit for hygiene and checkup  Vaccinations: Influenza vaccine: Done 09/24/20 Pneumococcal vaccine: Up to date Tdap vaccine: Up to date Shingles vaccine: Shingrix discussed. Please contact your pharmacy for coverage information.    Covid-19: Completed 1/31, 2/21, & 08/03/20  Advanced directives: total hip arthroplasty  Conditions/risks identified: to get of 2 medications after remission!  Next appointment: Follow up in one year for your annual wellness visit.   Preventive Care 75 Years and Older, Male Preventive care refers to lifestyle choices and visits with your health care provider that can promote health and wellness. What does preventive care include?  A yearly physical exam. This is also called an annual well check.  Dental exams once or twice a year.  Routine eye exams. Ask your health care provider how often you should have your eyes checked.  Personal lifestyle choices, including:  Daily care of your teeth and gums.  Regular physical activity.  Eating a healthy diet.  Avoiding tobacco and drug use.  Limiting alcohol use.  Practicing safe sex.  Taking low doses of aspirin every day.  Taking vitamin and mineral supplements as recommended by your health care provider. What happens during an annual well check? The services and screenings done by your health care provider during your annual well check will depend on your age, overall health, lifestyle risk factors, and family history of disease. Counseling  Your health care provider may ask you questions  about your:  Alcohol use.  Tobacco use.  Drug use.  Emotional well-being.  Home and relationship well-being.  Sexual activity.  Eating habits.  History of falls.  Memory and ability to understand (cognition).  Work and work Statistician. Screening  You may have the following tests or measurements:  Height, weight, and BMI.  Blood pressure.  Lipid and cholesterol levels. These may be checked every 5 years, or more frequently if you are over 30 years old.  Skin check.  Lung cancer screening. You may have this screening every year starting at age 79 if you have a 30-pack-year history of smoking and currently smoke or have quit within the past 15 years.  Fecal occult blood test (FOBT) of the stool. You may have this test every year starting at age 55.  Flexible sigmoidoscopy or colonoscopy. You may have a sigmoidoscopy every 5 years or a colonoscopy every 10 years starting at age 53.  Prostate cancer screening. Recommendations will vary depending on your family history and other risks.  Hepatitis C blood test.  Hepatitis B blood test.  Sexually transmitted disease (STD) testing.  Diabetes screening. This is done by checking your blood sugar (glucose) after you have not eaten for a while (fasting). You may have this done every 1-3 years.  Abdominal aortic aneurysm (AAA) screening. You may need this if you are a current or former smoker.  Osteoporosis. You may be screened starting at age 61 if you are at high risk. Talk with your health care provider about your test results, treatment options, and if necessary, the need for more tests. Vaccines  Your health care provider may recommend certain vaccines, such as:  Influenza vaccine. This is  recommended every year.  Tetanus, diphtheria, and acellular pertussis (Tdap, Td) vaccine. You may need a Td booster every 10 years.  Zoster vaccine. You may need this after age 54.  Pneumococcal 13-valent conjugate (PCV13)  vaccine. One dose is recommended after age 87.  Pneumococcal polysaccharide (PPSV23) vaccine. One dose is recommended after age 61. Talk to your health care provider about which screenings and vaccines you need and how often you need them. This information is not intended to replace advice given to you by your health care provider. Make sure you discuss any questions you have with your health care provider. Document Released: 11/24/2015 Document Revised: 07/17/2016 Document Reviewed: 08/29/2015 Elsevier Interactive Patient Education  2017 [place] Prevention in the Home Falls can cause injuries. They can happen to people of all ages. There are many things you can do to make your home safe and to help prevent falls. What can I do on the outside of my home?  Regularly fix the edges of walkways and driveways and fix any cracks.  Remove anything that might make you trip as you walk through a door, such as a raised step or threshold.  Trim any bushes or trees on the path to your home.  Use bright outdoor lighting.  Clear any walking paths of anything that might make someone trip, such as rocks or tools.  Regularly check to see if handrails are loose or broken. Make sure that both sides of any steps have handrails.  Any raised decks and porches should have guardrails on the edges.  Have any leaves, snow, or ice cleared regularly.  Use sand or salt on walking paths during winter.  Clean up any spills in your garage right away. This includes oil or grease spills. What can I do in the bathroom?  Use night lights.  Install grab bars by the toilet and in the tub and shower. Do not use towel bars as grab bars.  Use non-skid mats or decals in the tub or shower.  If you need to sit down in the shower, use a plastic, non-slip stool.  Keep the floor dry. Clean up any water that spills on the floor as soon as it happens.  Remove soap buildup in the tub or shower  regularly.  Attach bath mats securely with double-sided non-slip rug tape.  Do not have throw rugs and other things on the floor that can make you trip. What can I do in the bedroom?  Use night lights.  Make sure that you have a light by your bed that is easy to reach.  Do not use any sheets or blankets that are too big for your bed. They should not hang down onto the floor.  Have a firm chair that has side arms. You can use this for support while you get dressed.  Do not have throw rugs and other things on the floor that can make you trip. What can I do in the kitchen?  Clean up any spills right away.  Avoid walking on wet floors.  Keep items that you use a lot in easy-to-reach places.  If you need to reach something above you, use a strong step stool that has a grab bar.  Keep electrical cords out of the way.  Do not use floor polish or wax that makes floors slippery. If you must use wax, use non-skid floor wax.  Do not have throw rugs and other things on the floor that can make you trip.  What can I do with my stairs?  Do not leave any items on the stairs.  Make sure that there are handrails on both sides of the stairs and use them. Fix handrails that are broken or loose. Make sure that handrails are as long as the stairways.  Check any carpeting to make sure that it is firmly attached to the stairs. Fix any carpet that is loose or worn.  Avoid having throw rugs at the top or bottom of the stairs. If you do have throw rugs, attach them to the floor with carpet tape.  Make sure that you have a light switch at the top of the stairs and the bottom of the stairs. If you do not have them, ask someone to add them for you. What else can I do to help prevent falls?  Wear shoes that:  Do not have high heels.  Have rubber bottoms.  Are comfortable and fit you well.  Are closed at the toe. Do not wear sandals.  If you use a stepladder:  Make sure that it is fully  opened. Do not climb a closed stepladder.  Make sure that both sides of the stepladder are locked into place.  Ask someone to hold it for you, if possible.  Clearly mark and make sure that you can see:  Any grab bars or handrails.  First and last steps.  Where the edge of each step is.  Use tools that help you move around (mobility aids) if they are needed. These include:  Canes.  Walkers.  Scooters.  Crutches.  Turn on the lights when you go into a dark area. Replace any light bulbs as soon as they burn out.  Set up your furniture so you have a clear path. Avoid moving your furniture around.  If any of your floors are uneven, fix them.  If there are any pets around you, be aware of where they are.  Review your medicines with your doctor. Some medicines can make you feel dizzy. This can increase your chance of falling. Ask your doctor what other things that you can do to help prevent falls. This information is not intended to replace advice given to you by your health care provider. Make sure you discuss any questions you have with your health care provider. Document Released: 08/24/2009 Document Revised: 04/04/2016 Document Reviewed: 12/02/2014 Elsevier Interactive Patient Education  2017 [employer].

## 2021-01-15 NOTE — Progress Notes (Signed)
Virtual Visit via Telephone Note  I connected with  [name] on 01/15/21 at 11:45 AM EST by telephone and verified that I am speaking with the correct person using two identifiers.  Medicare Annual Wellness visit completed telephonically due to Covid-19 pandemic.   Persons participating in this call: This Health Coach and this patient.   Location: Patient: Home Provider: Office   I discussed the limitations, risks, security and privacy concerns of performing an evaluation and management service by telephone and the availability of in person appointments. The patient expressed understanding and agreed to proceed.  Unable to perform video visit due to video visit attempted and failed and/or patient does not have video capability.   Some vital signs may be absent or patient reported.   Willette Brace, LPN    Subjective:   [name] is a 75 y.o. male who presents for Medicare Annual/Subsequent preventive examination.  Review of Systems     Cardiac Risk Factors include: advanced age (>69mn, >>92women);hypertension;dyslipidemia;male gender     Objective:    There were no vitals filed for this visit. There is no height or weight on file to calculate BMI.  Advanced Directives 01/15/2021 12/14/2020 10/27/2020 09/21/2020 08/10/2020 06/29/2020 05/18/2020  Does Patient Have a Medical Advance Directive? _0  Yes Yes  Type of AParamedicof ALeedsLiving will HDahlenLiving will HHostetterLiving will Living will HJacksonLiving will HColbyLiving will HBrendaLiving will  Does patient want to make changes to medical advance directive? - - No - Patient declined No - Patient declined No - Patient declined No - Patient declined -  Copy of HSuffernin Chart? No - copy requested - - - - No - copy requested -  Would patient like  information on creating a medical advance directive? - - - - - No - Patient declined -    Current Medications (verified) Outpatient Encounter Medications as of 01/15/2021  Medication Sig  . acalabrutinib (CALQUENCE) 100 MG capsule Take 1 capsule (100 mg total) by mouth 2 (two) times daily.  .Marland Kitchenallopurinol (ZYLOPRIM) 100 MG tablet TAKE 1 TABLET BY MOUTH EVERY DAY  . cholecalciferol (VITAMIN D) 1000 units tablet Take 1,000 Units by mouth daily.  . hydrochlorothiazide (HYDRODIURIL) 12.5 MG tablet TAKE 1 TABLET BY MOUTH EVERY DAY  . ipratropium (ATROVENT) 0.03 % nasal spray Place 2 sprays into both nostrils every 12 (twelve) hours.  .Marland KitchenLORazepam (ATIVAN) 0.5 MG tablet TAKE 1 TABLET BY MOUTH EVERY DAY AS NEEDED FOR ANXIETY  . Meclizine HCl 25 MG CHEW TAKE 1 TABLET EVERY DAY AS NEEDED FOR DIZZINESS  . omeprazole (PRILOSEC) 40 MG capsule Take 40 mg by mouth daily.  . rosuvastatin (CRESTOR) 10 MG tablet TAKE 1 TABLET BY MOUTH EVERY DAY  . senna (SENOKOT) 8.6 MG tablet Take 2 tablets by mouth daily.  . sertraline (ZOLOFT) 100 MG tablet Take 100 mg by mouth daily.  .Marland Kitchenvenetoclax (VENCLEXTA) 100 MG tablet Take 2 tablets (200 mg total) by mouth daily.  .Marland Kitchenwarfarin (COUMADIN) 5 MG tablet TAKE AS DIRECTED BY COUMADIN CLINIC  . amoxicillin (AMOXIL) 500 MG capsule Take 500 mg by mouth 2 (two) times daily. Prior to dental procedures. (Patient not taking: No sig reported)   No facility-administered encounter medications on file as of 01/15/2021.    Allergies (verified) Hydrocodone-acetaminophen, Oxycodone hcl, Telmisartan-hctz, Ramipril, Aspirin, Atorvastatin, Buspirone hcl,  Codeine, Codeine phosphate, Morphine sulfate, Paroxetine, and Rabeprazole   History: Past Medical History:  Diagnosis Date  . Anticoagulants causing adverse effect in therapeutic use   . Anxiety    Paxil seemed to cause odd neuro side effects; better on prozac as of 09/2015  . Aortic regurgitation 04/2007 surgery   Resolved with tissue  [facility]R at [facility]  . Cervical spondylosis    ESI by [name] in Ortho. 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 Chronic lymphocytic leukemia (CLL), B-cell ([place]) approx 2012   stable on f/u's with [name] (most recent 07/2017)  . History of prostate cancer 2003   Rad prost  . History of pulmonary embolism 2011; 10/2014   Recurrence off of anticoagulation 10/2014: per hem/onc ([name]) pt needs lifelong anticoagulation (hypercoag w/u neg).  . Hyperlipidemia    statin-intolerant, except for crestor low dose.  Marland Kitchen Hypertension   . Migraine syndrome   . Mitral regurgitation   . PAF (paroxysmal atrial fibrillation) ([place])   . Panic attacks    "           "             "                  "                  "                       "                             "                         . Pulmonary nodule, right 2015   RLL: resected at [facility] --VATS; Benign RLL nodule (fungal and AFB stains neg).  Plan is for [facility] thoracic surg to do f/u o/v & CT chest 1 yr   . Right-sided headache 07/2016   Primary stabbing HA or migraine per neuro (Dr. Tomi Likens).  Gabapentin and topamax not tolerated.  These resolved spontaneously.   Past Surgical History:  Procedure Laterality Date  . AORTIC VALVE REPLACEMENT  2011   [facility]  (bioprosthetic)  . CARDIAC CATHETERIZATION  04/2010   Normal coronaries.  . Carotid dopplers  08/2015   NORMAL  . CATARACT EXTRACTION     L 12/06/09   R 09/14/09  . COLONOSCOPY  01/26/13   tic's, o/w normal.  ([facility])--recall 10 yrs.  Marland Kitchen PENILE PROSTHESIS IMPLANT    . PROSTATECTOMY  04/2002   for prostate cancer  . Pulmonary nodule resection  Fall/winter 2016   [facility]  . SHOULDER SURGERY     rotator cuff on both arms   . TIBIALIS TENDON TRANSFER / REPAIR Right 03/02/2019   Procedure: RECONSTRUCTION OF RIGHT  ANTERIOR TIBIALIS TENDON;  Surgeon: Erle Crocker, MD;  Location: [facility];  Service: Orthopedics;  Laterality: Right;  . TRANSTHORACIC ECHOCARDIOGRAM  09/20/2014; 03/2016;  03/2017   2015-mild LVH, EF 50-55%, wall motion nl, grade I diast dysfxn, prosth aort valve good,transaortic gradients decreased compared to echo 11/2013.   03/2016: EF 55-60%, normal LV function, severe LVH, normal diast fxn, mild increase in AV gradient compared to 09/2014 echo.  2018: EF 55-60%, normal LV fxn, grd II DD, AV stable/gradient's stable.   Family History  Problem Relation  Age of Onset  . Cancer Sister        uterine  . Colon cancer Sister 69  . Stroke Mother   . Prostate cancer Father   . Stroke Sister   . Stroke Brother   . Heart attack Neg Hx    Social History   Socioeconomic History  . Marital status: Single    Spouse name: Not on file  . Number of children: Not on file  . Years of education: Not on file  . Highest education level: Not on file  Occupational History    Comment: Retired - Tobacco   Tobacco Use  . Smoking status: Former Smoker    Packs/day: 1.00    Years: 12.00    Pack years: 12.00    Types: Cigarettes    Start date: 11/28/1968    Quit date: 01/29/1981    Years since quitting: 39.9  . Smokeless tobacco: Never Used  . Tobacco comment: quit 75 yo   Vaping Use  . Vaping Use: Never used  Substance and Sexual Activity  . Alcohol use: Yes    Alcohol/week: 0.0 standard drinks    Comment: occasional  . Drug use: No  . Sexual activity: Not on file  Other Topics Concern  . Not on file  Social History Narrative   Single. No children. Lives alone      Retired from [employer].       Hobbies: travel, Scientist, product/process development, enjoys being outdoors including hiking   Social Determinants of Health   Financial Resource Strain: [place]   . Difficulty of Paying Living Expenses: Not hard at all  Food Insecurity: No Food Insecurity  . Worried About Charity fundraiser in the Last Year: Never true  . Ran Out of Food in the Last Year: Never true  Transportation Needs: No Transportation Needs  . Lack of Transportation (Medical): No  .  Lack of Transportation (Non-Medical): No  Physical Activity: Sufficiently Active  . Days of Exercise per Week: 5 days  . Minutes of Exercise per Session: 60 min  Stress: No Stress Concern Present  . Feeling of Stress : Only a little  Social Connections: Socially Isolated  . Frequency of Communication with Friends and Family: More than three times a week  . Frequency of Social Gatherings with Friends and Family: More than three times a week  . Attends Religious Services: Never  . Active Member of Clubs or Organizations: No  . Attends Archivist Meetings: Never  . Marital Status: Never married    Tobacco Counseling Counseling given: Not Answered Comment: quit 75 yo    Clinical Intake:  Pre-visit preparation completed: Yes  Pain : No/denies pain     BMI - recorded: 29.29 Nutritional Status: BMI 25 -29 Overweight Nutritional Risks: None Diabetes: No  How often do you need to have someone help you when you read instructions, pamphlets, or other written materials from your doctor or pharmacy?: 1 - Never  Diabetic?No  Interpreter Needed?: No  Information entered by :: Charlott Rakes, LPN   Activities of Daily Living In your present state of health, do you have any difficulty performing the following activities: 01/15/2021  Hearing? N  Vision? N  Difficulty concentrating or making decisions? N  Walking or climbing stairs? N  Dressing or bathing? N  Doing errands, shopping? N  Preparing Food and eating ? N  Using the Toilet? N  In the past six months, have you accidently leaked  urine? N  Do you have problems with loss of bowel control? N  Managing your Medications? N  Managing your Finances? N  Housekeeping or managing your Housekeeping? N  Some recent data might be hidden    Patient Care Team: [name], MD as PCP - General (Family Medicine) Josue Hector, MD as PCP - Cardiology (Cardiology) Ladell Pier, MD as Consulting Physician  (Oncology) Josue Hector, MD as Consulting Physician (Cardiology) Chesley Mires, MD as Consulting Physician (Pulmonary Disease) Volanda Napoleon, MD as Consulting Physician (Oncology) Pieter Partridge, DO as Consulting Physician (Neurology) Everitt Amber, MD as Consulting Physician (Ophthalmology) Rutherford Guys, MD as Consulting Physician (Ophthalmology)  Indicate any recent Medical Services you may have received from other than [facility] providers in the past year (date may be approximate).     Assessment:   This is a routine wellness examination for [name].  Hearing/Vision screen  Hearing Screening   _0  _1  _2  _3  _4  _5  _6  _7  _8   Right ear:           Left ear:           Comments: Pt denies nay hearing issues   Vision Screening Comments: Pt hasn't followed up with eye care in a few years, stated he will call [facility] provider to get appt   Dietary issues and exercise activities discussed: Current Exercise Habits: Home exercise routine;Structured exercise class, Type of exercise: walking;Other - see comments (ti chi), Time (Minutes): > 60, Frequency (Times/Week): 5, Weekly Exercise (Minutes/Week): 0  Goals    . Maintain current health    . patient     Traveling soon! Do more photography!     . Patient Stated      To get off 2 medications after total remission       Depression Screen PHQ 2/9 Scores 01/15/2021 11/13/2020 05/11/2020 11/15/2019 04/22/2019 03/20/2018 02/21/2017  PHQ - 2 Score 0 0 1 0 1 0 0  PHQ- 9 Score - - 2 - 3 0 -    Fall Risk Fall Risk  01/15/2021 11/13/2020 11/15/2019 04/22/2019 03/20/2018  Falls in the past year? 0 0 0 0 No  Number falls in past yr: 0 - - - -  Injury with Fall? 0 - 0 - -  Risk for fall due to : Impaired vision No Fall Risks - - -  Follow up Falls prevention discussed - Falls evaluation completed;Education provided;Falls prevention discussed Falls evaluation completed -    FALL RISK PREVENTION PERTAINING TO THE HOME:  Any stairs  in or around the home? Yes  If so, are there any without handrails? No  Home free of loose throw rugs in walkways, pet beds, electrical cords, etc? Yes  Adequate lighting in your home to reduce risk of falls? Yes   ASSISTIVE DEVICES UTILIZED TO PREVENT FALLS:  Life alert? Yes apple watch for life alert  Use of a cane, walker or w/c? No  Grab bars in the bathroom? No  Shower chair or bench in shower? yes Elevated toilet seat or a handicapped toilet? Yes   TIMED UP AND GO:  Was the test performed? No .     Cognitive Function: MMSE - Mini Mental State Exam 02/21/2017  Not completed: (No Data)     6CIT Screen 01/15/2021  What Year? 0 points  What month? 0 points  Count back from 20 0 points  Months in reverse 0 points  Repeat phrase 2 points    Immunizations Immunization  History  Administered Date(s) Administered  . Fluad Quad(high Dose 65+) 09/24/2020  . Influenza Split 08/16/2011, 08/14/2012, 07/13/2015, 09/11/2016  . Influenza Whole 08/17/2010  . Influenza, High Dose Seasonal PF 08/23/2013, 08/29/2016, 08/11/2017, 08/11/2018, 06/24/2019  . Influenza,inj,Quad PF,6+ Mos 09/05/2014, 08/02/2015  . Influenza-Unspecified 06/29/2018, 08/11/2020  . PFIZER(Purple Top)SARS-COV-2 Vaccination 12/12/2019, 01/02/2020, 08/03/2020  . Pneumococcal Conjugate-13 01/23/2015, 08/12/2015  . Pneumococcal Polysaccharide-23 03/15/2011, 12/20/2020  . Td 04/16/2007  . Tdap 04/14/2018, 12/20/2020  . Zoster 08/16/2011    TDAP status: Up to date  Flu Vaccine status: Up to date  Pneumococcal vaccine status: Up to date  Covid-19 vaccine status: Completed vaccines  Qualifies for Shingles Vaccine? Yes   Zostavax completed Yes   Shingrix Completed?: No.    Education has been provided regarding the importance of this vaccine. Patient has been advised to call insurance company to determine out of pocket expense if they have not yet received this vaccine. Advised may also receive vaccine at local  pharmacy or Health Dept. Verbalized acceptance and understanding.  Screening Tests Health Maintenance  Topic Date Due  . COVID-19 Vaccine (4 - Booster for Pfizer series) 01/31/2021  . COLONOSCOPY (Pts 45-52yr Insurance coverage will need to be confirmed)  01/27/2023  . TETANUS/TDAP  12/20/2030  . INFLUENZA VACCINE  Completed  . Hepatitis C Screening  Completed  . PNA vac Low Risk Adult  Completed  . HPV VACCINES  Aged Out    Health Maintenance  There are no preventive care reminders to display for this patient.  Colorectal cancer screening: Type of screening: Colonoscopy. Completed 01/26/13. Repeat every 10 years   Additional Screening:  Hepatitis C Screening: Completed 03/20/18  Vision Screening: Recommended annual ophthalmology exams for early detection of glaucoma and other disorders of the eye. Is the patient up to date with their annual eye exam?  No  Who is the provider or what is the name of the office in which the patient attends annual eye exams? [facility]  If pt is not established with a provider, would they like to be referred to a provider to establish care? No .   Dental Screening: Recommended annual dental exams for proper oral hygiene  Community Resource Referral / Chronic Care Management: CRR required this visit?  No   CCM required this visit?  No      Plan:     I have personally reviewed and noted the following in the patient's chart:   . Medical and social history . Use of alcohol, tobacco or illicit drugs  . Current medications and supplements . Functional ability and status . Nutritional status . Physical activity . Advanced directives . List of other physicians . Hospitalizations, surgeries, and ER visits in previous 12 months . Vitals . Screenings to include cognitive, depression, and falls . Referrals and appointments  In addition, I have reviewed and discussed with patient certain preventive protocols, quality  metrics, and best practice recommendations. A written personalized care plan for preventive services as well as general preventive health recommendations were provided to patient.     TWillette Brace LPN   [id number]  Nurse Notes: None

## 2021-01-18 ENCOUNTER — Ambulatory Visit: Payer: Medicare Other

## 2021-01-18 ENCOUNTER — Other Ambulatory Visit: Payer: Self-pay

## 2021-01-18 DIAGNOSIS — I480 Paroxysmal atrial fibrillation: Secondary | ICD-10-CM

## 2021-01-18 DIAGNOSIS — Z5181 Encounter for therapeutic drug level monitoring: Secondary | ICD-10-CM

## 2021-01-18 DIAGNOSIS — I2699 Other pulmonary embolism without acute cor pulmonale: Secondary | ICD-10-CM

## 2021-01-18 DIAGNOSIS — Z952 Presence of prosthetic heart valve: Secondary | ICD-10-CM | POA: Diagnosis not present

## 2021-01-18 LAB — POCT INR: INR: 2.6 (ref 2.0–3.0)

## 2021-01-18 NOTE — Patient Instructions (Signed)
Description   Continue on same dosage 1.5 tablets daily except 2 tablets on Fridays. Recheck INR in 4 weeks. Please call with any questions or concerns please call [facility] [phone] Main [phone]

## 2021-01-25 ENCOUNTER — Ambulatory Visit (INDEPENDENT_AMBULATORY_CARE_PROVIDER_SITE_OTHER): Payer: Medicare Other | Admitting: Psychology

## 2021-01-25 DIAGNOSIS — F4321 Adjustment disorder with depressed mood: Secondary | ICD-10-CM

## 2021-02-07 ENCOUNTER — Encounter: Payer: Self-pay | Admitting: Family Medicine

## 2021-02-09 ENCOUNTER — Ambulatory Visit (INDEPENDENT_AMBULATORY_CARE_PROVIDER_SITE_OTHER): Payer: No Typology Code available for payment source | Admitting: Physician Assistant

## 2021-02-09 ENCOUNTER — Other Ambulatory Visit: Payer: Self-pay

## 2021-02-09 VITALS — BP 118/78 | HR 72 | Temp 97.6°F | Resp 16 | Ht 70.0 in | Wt 206.2 lb

## 2021-02-09 DIAGNOSIS — L258 Unspecified contact dermatitis due to other agents: Secondary | ICD-10-CM

## 2021-02-09 MED ORDER — TRIAMCINOLONE ACETONIDE 0.5 % EX CREA: 1.0000 "application " | TOPICAL_CREAM | Freq: Three times a day (TID) | CUTANEOUS | 0 refills | Status: AC

## 2021-02-09 NOTE — Progress Notes (Signed)
Acute Office Visit  Subjective:    Patient ID: [name], male    DOB: Mar 30, 1946, 75 y.o.   MRN: [id number]  Chief Complaint  Patient presents with  . Abrasion    Pt reports abrasion on his leg that has not healed over the past 2 weeks, notes itching, has not changed visually since noticing it initially     HPI Patient is in today for itchy spot on his lower right leg x2 weeks.  He denies any injury to this area.  He states just a small patch of redness appeared 1 day and has some crusting to the top of it and is now very itchy.  He has tried Vaseline and lidocaine, which does help just a little bit.  He denies any new pets although he does have a puppy, new detergents, new body wash, or any other changes.  No other spots on his body.  No fever or chills.  Past Medical History:  Diagnosis Date  . Anticoagulants causing adverse effect in therapeutic use   . Anxiety    Paxil seemed to cause odd neuro side effects; better on prozac as of 09/2015  . Aortic regurgitation 04/2007 surgery   Resolved with [facility] AVR at [facility]  . Cervical spondylosis    ESI by [name] in Ortho. 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 Chronic lymphocytic leukemia (CLL), B-cell ([place]) approx 2012   stable on f/u's with [name] (most recent 07/2017)  . History of prostate cancer 2003   Rad prost  . History of pulmonary embolism 2011; 10/2014   Recurrence off of anticoagulation 10/2014: per hem/onc ([name]) pt needs lifelong anticoagulation (hypercoag w/u neg).  . Hyperlipidemia    statin-intolerant, except for crestor low dose.  Marland Kitchen Hypertension   . Migraine syndrome   . Mitral regurgitation   . PAF (paroxysmal atrial fibrillation) ([place])   . Panic attacks    "           "             "                  "                  "                       "                             "                         . Pulmonary nodule, right 2015   RLL: resected at [facility] --VATS; Benign RLL nodule (fungal and AFB stains  neg).  Plan is for [facility] thoracic surg to do f/u o/v & CT chest 1 yr   . Right-sided headache 07/2016   Primary stabbing HA or migraine per neuro (Dr. Tomi Likens).  Gabapentin and topamax not tolerated.  These resolved spontaneously.    Past Surgical History:  Procedure Laterality Date  . AORTIC VALVE REPLACEMENT  2011   [facility]  (bioprosthetic)  . CARDIAC CATHETERIZATION  04/2010   Normal coronaries.  . Carotid dopplers  08/2015   NORMAL  . CATARACT EXTRACTION     L 12/06/09   R 09/14/09  . COLONOSCOPY  01/26/13   tic's, o/w normal.  ([place])--recall  10 yrs.  Marland Kitchen PENILE PROSTHESIS IMPLANT    . PROSTATECTOMY  04/2002   for prostate cancer  . Pulmonary nodule resection  Fall/winter 2016   [facility]  . SHOULDER SURGERY     rotator cuff on both arms   . TIBIALIS TENDON TRANSFER / REPAIR Right 03/02/2019   Procedure: RECONSTRUCTION OF RIGHT  ANTERIOR TIBIALIS TENDON;  Surgeon: Erle Crocker, MD;  Location: [place];  Service: Orthopedics;  Laterality: Right;  . TRANSTHORACIC ECHOCARDIOGRAM  09/20/2014; 03/2016; 03/2017   2015-mild LVH, EF 50-55%, wall motion nl, grade I diast dysfxn, prosth aort valve good,transaortic gradients decreased compared to echo 11/2013.   03/2016: EF 55-60%, normal LV function, severe LVH, normal diast fxn, mild increase in AV gradient compared to 09/2014 echo.  2018: EF 55-60%, normal LV fxn, grd II DD, AV stable/gradient's stable.    Family History  Problem Relation Age of Onset  . Cancer Sister        uterine  . Colon cancer Sister 7  . Stroke Mother   . Prostate cancer Father   . Stroke Sister   . Stroke Brother   . Heart attack Neg Hx     Social History   Socioeconomic History  . Marital status: Single    Spouse name: Not on file  . Number of children: Not on file  . Years of education: Not on file  . Highest education level: Not on file  Occupational History    Comment: Retired - Tobacco   Tobacco Use  . Smoking status: Former Smoker     Packs/day: 1.00    Years: 12.00    Pack years: 12.00    Types: Cigarettes    Start date: 11/28/1968    Quit date: 01/29/1981    Years since quitting: 40.0  . Smokeless tobacco: Never Used  . Tobacco comment: quit 75 yo   Vaping Use  . Vaping Use: Never used  Substance and Sexual Activity  . Alcohol use: Yes    Alcohol/week: 0.0 standard drinks    Comment: occasional  . Drug use: No  . Sexual activity: Not on file  Other Topics Concern  . Not on file  Social History Narrative   Single. No children. Lives alone      Retired from [employer].       Hobbies: travel, Scientist, product/process development, enjoys being outdoors including hiking   Social Determinants of Health   Financial Resource Strain: [place]   . Difficulty of Paying Living Expenses: Not hard at all  Food Insecurity: No Food Insecurity  . Worried About Charity fundraiser in the Last Year: Never true  . Ran Out of Food in the Last Year: Never true  Transportation Needs: No Transportation Needs  . Lack of Transportation (Medical): No  . Lack of Transportation (Non-Medical): No  Physical Activity: Sufficiently Active  . Days of Exercise per Week: 5 days  . Minutes of Exercise per Session: 60 min  Stress: No Stress Concern Present  . Feeling of Stress : Only a little  Social Connections: Socially Isolated  . Frequency of Communication with Friends and Family: More than three times a week  . Frequency of Social Gatherings with Friends and Family: More than three times a week  . Attends Religious Services: Never  . Active Member of Clubs or Organizations: No  . Attends Archivist Meetings: Never  . Marital Status: Never married  Intimate Partner Violence: Not At  Risk  . Fear of Current or Ex-Partner: No  . Emotionally Abused: No  . Physically Abused: No  . Sexually Abused: No    Outpatient Medications Prior to Visit  Medication Sig Dispense Refill  . acalabrutinib (CALQUENCE) 100 MG capsule  Take 1 capsule (100 mg total) by mouth 2 (two) times daily. 60 capsule 6  . allopurinol (ZYLOPRIM) 100 MG tablet TAKE 1 TABLET BY MOUTH EVERY DAY 90 tablet 0  . amoxicillin (AMOXIL) 500 MG capsule Take 500 mg by mouth 2 (two) times daily. Prior to dental procedures.    . cholecalciferol (VITAMIN D) 1000 units tablet Take 1,000 Units by mouth daily.    . hydrochlorothiazide (HYDRODIURIL) 12.5 MG tablet TAKE 1 TABLET BY MOUTH EVERY DAY 90 tablet 1  . ipratropium (ATROVENT) 0.03 % nasal spray Place 2 sprays into both nostrils every 12 (twelve) hours. 30 mL 12  . LORazepam (ATIVAN) 0.5 MG tablet TAKE 1 TABLET BY MOUTH EVERY DAY AS NEEDED FOR ANXIETY 30 tablet 2  . Meclizine HCl 25 MG CHEW TAKE 1 TABLET EVERY DAY AS NEEDED FOR DIZZINESS 30 tablet 2  . omeprazole (PRILOSEC) 40 MG capsule Take 40 mg by mouth daily.    . rosuvastatin (CRESTOR) 10 MG tablet TAKE 1 TABLET BY MOUTH EVERY DAY 90 tablet 3  . senna (SENOKOT) 8.6 MG tablet Take 2 tablets by mouth daily.    . sertraline (ZOLOFT) 100 MG tablet Take 100 mg by mouth daily.    Marland Kitchen venetoclax (VENCLEXTA) 100 MG tablet Take 2 tablets (200 mg total) by mouth daily. 60 tablet 3  . warfarin (COUMADIN) 5 MG tablet TAKE AS DIRECTED BY COUMADIN CLINIC 160 tablet 1   No facility-administered medications prior to visit.    Allergies  Allergen Reactions  . Hydrocodone-Acetaminophen Shortness Of Breath  . Oxycodone Hcl Shortness Of Breath  . Telmisartan-Hctz Shortness Of Breath and Palpitations    Dizziness, too  . Ramipril   . Aspirin Other (See Comments) and Rash    Other Reaction: confusion Can't take high dose  . Atorvastatin Other (See Comments)    myalgia  . Buspirone Hcl Other (See Comments)    headache  . Codeine Nausea Only and Other (See Comments)    Other Reaction: confusion  . Codeine Phosphate Itching and Nausea Only  . Morphine Sulfate Itching  . Paroxetine Other (See Comments)    Paresthesias: R arm, R leg, r side of face  .  Rabeprazole Other (See Comments)    headache    Review of Systems REFER TO HPI FOR PERTINENT POSITIVES AND NEGATIVES     Objective:    Physical Exam Vitals and nursing note reviewed.  Constitutional:      Appearance: Normal appearance.  Skin:      Neurological:     Mental Status: He is alert.     BP 118/78   Pulse 72   Temp 97.6 F (36.4 C) (Temporal)   Resp 16   Ht _0  (1.778 m)   Wt 206 lb 3.2 oz (93.5 kg)   SpO2 97%   BMI 29.59 kg/m  Wt Readings from Last 3 Encounters:  02/09/21 206 lb 3.2 oz (93.5 kg)  12/14/20 204 lb 1.9 oz (92.6 kg)  11/28/20 199 lb 3.2 oz (90.4 kg)    Health Maintenance Due  Topic Date Due  . COVID-19 Vaccine (4 - Booster for Pfizer series) 01/31/2021    There are no preventive care reminders to display for this patient.  Lab Results  Component Value Date   TSH 1.63 12/30/2017   Lab Results  Component Value Date   WBC 3.1 (L) 12/14/2020   HGB 12.5 (L) 12/14/2020   HCT 37.8 (L) 12/14/2020   MCV 98.2 12/14/2020   PLT 112 (L) 12/14/2020   Lab Results  Component Value Date   NA 137 12/14/2020   K 4.6 12/14/2020   CHLORIDE 104 06/27/2016   CO2 30 12/14/2020   GLUCOSE 90 12/14/2020   BUN 26 (H) 12/14/2020   CREATININE 1.53 (H) 12/14/2020   BILITOT 0.4 12/14/2020   ALKPHOS 58 12/14/2020   AST 18 12/14/2020   ALT 14 12/14/2020   PROT 6.2 (L) 12/14/2020   ALBUMIN 4.3 12/14/2020   CALCIUM 9.5 12/14/2020   ANIONGAP 6 12/14/2020   EGFR 50 (L) 06/27/2016   GFR 58.93 (L) 05/12/2020   Lab Results  Component Value Date   CHOL 181 05/12/2020   Lab Results  Component Value Date   HDL 48.50 05/12/2020   Lab Results  Component Value Date   LDLCALC 110 (H) 05/12/2020   Lab Results  Component Value Date   TRIG 114.0 05/12/2020   Lab Results  Component Value Date   CHOLHDL 4 05/12/2020   Lab Results  Component Value Date   HGBA1C 5.7 11/29/2015       Assessment & Plan:   Problem List Items Addressed This  Visit   None     1. Contact dermatitis due to other agent, unspecified contact dermatitis type Reassured patient that I think this is a type of contact dermatitis.  I prescribed triamcinolone cream to apply to the area 2-3 times daily.  He may mix this with a moisturizer such as Eucerin or Aveeno.  If this does not seem to improve in the next 14 days, he will return to the office for reevaluation.  This note was prepared with assistance of Systems analyst. Occasional wrong-word or sound-a-like substitutions may have occurred due to the inherent limitations of voice recognition software.    Persephanie Laatsch M Takashi Korol, PA-C

## 2021-02-14 ENCOUNTER — Other Ambulatory Visit: Payer: Self-pay

## 2021-02-14 ENCOUNTER — Ambulatory Visit (INDEPENDENT_AMBULATORY_CARE_PROVIDER_SITE_OTHER): Payer: No Typology Code available for payment source | Admitting: *Deleted

## 2021-02-14 DIAGNOSIS — I2699 Other pulmonary embolism without acute cor pulmonale: Secondary | ICD-10-CM | POA: Diagnosis not present

## 2021-02-14 DIAGNOSIS — Z952 Presence of prosthetic heart valve: Secondary | ICD-10-CM | POA: Diagnosis not present

## 2021-02-14 DIAGNOSIS — I480 Paroxysmal atrial fibrillation: Secondary | ICD-10-CM | POA: Diagnosis not present

## 2021-02-14 DIAGNOSIS — Z5181 Encounter for therapeutic drug level monitoring: Secondary | ICD-10-CM

## 2021-02-14 LAB — POCT INR: INR: 3.5 — AB (ref 2.0–3.0)

## 2021-02-14 NOTE — Patient Instructions (Signed)
Description   Take 1/2 a tablet of warfarin today, then continue on same dosage 1.5 tablets daily except 2 tablets on Fridays. Recheck INR in 3 weeks. Please call with any questions or concerns please call Coumadin Clinic [phone] Main [phone]

## 2021-02-20 ENCOUNTER — Encounter: Payer: Self-pay | Admitting: Hematology & Oncology

## 2021-02-20 NOTE — Telephone Encounter (Signed)
The patient called in to inquire about switching back to Xarelto from Warfarin. He has a 75 year old puppy and he said he bruises so easily.  Reiterated to the patient that he was switched to warfarin 09/2021 due to a thrombus on his valve, and chances are if he got a clot while taking Xarelto once, he could get another, and switching back may be unsafe.  He requests Dr. Kyla Balzarine opinion on whether or not he may switch back to Xarelto.

## 2021-02-22 ENCOUNTER — Ambulatory Visit: Payer: Medicare Other | Admitting: Hematology & Oncology

## 2021-02-22 ENCOUNTER — Other Ambulatory Visit: Payer: Medicare Other

## 2021-02-22 ENCOUNTER — Other Ambulatory Visit: Payer: Self-pay

## 2021-02-22 ENCOUNTER — Inpatient Hospital Stay: Payer: Medicare Other | Attending: Hematology & Oncology

## 2021-02-22 ENCOUNTER — Ambulatory Visit: Payer: Medicare Other | Admitting: Psychology

## 2021-02-22 ENCOUNTER — Encounter: Payer: Self-pay | Admitting: Hematology & Oncology

## 2021-02-22 ENCOUNTER — Inpatient Hospital Stay (HOSPITAL_BASED_OUTPATIENT_CLINIC_OR_DEPARTMENT_OTHER): Payer: Medicare Other | Admitting: Hematology & Oncology

## 2021-02-22 VITALS — BP 126/89 | HR 73 | Temp 98.4°F | Resp 16 | Wt 204.0 lb

## 2021-02-22 DIAGNOSIS — I2699 Other pulmonary embolism without acute cor pulmonale: Secondary | ICD-10-CM | POA: Diagnosis not present

## 2021-02-22 DIAGNOSIS — Z79899 Other long term (current) drug therapy: Secondary | ICD-10-CM | POA: Insufficient documentation

## 2021-02-22 DIAGNOSIS — R911 Solitary pulmonary nodule: Secondary | ICD-10-CM | POA: Insufficient documentation

## 2021-02-22 DIAGNOSIS — Z7901 Long term (current) use of anticoagulants: Secondary | ICD-10-CM | POA: Insufficient documentation

## 2021-02-22 DIAGNOSIS — C9110 Chronic lymphocytic leukemia of B-cell type not having achieved remission: Secondary | ICD-10-CM

## 2021-02-22 LAB — CBC WITH DIFFERENTIAL (CANCER CENTER ONLY)
Abs Immature Granulocytes: 0.02 10*3/uL (ref 0.00–0.07)
Basophils Absolute: 0 10*3/uL (ref 0.0–0.1)
Basophils Relative: 0 %
Eosinophils Absolute: 0 10*3/uL (ref 0.0–0.5)
Eosinophils Relative: 1 %
HCT: 38.1 % — ABNORMAL LOW (ref 39.0–52.0)
Hemoglobin: 13 g/dL (ref 13.0–17.0)
Immature Granulocytes: 1 %
Lymphocytes Relative: 48 %
Lymphs Abs: 1.6 10*3/uL (ref 0.7–4.0)
MCH: 32.7 pg (ref 26.0–34.0)
MCHC: 34.1 g/dL (ref 30.0–36.0)
MCV: 95.7 fL (ref 80.0–100.0)
Monocytes Absolute: 0.6 10*3/uL (ref 0.1–1.0)
Monocytes Relative: 19 %
Neutro Abs: 1 10*3/uL — ABNORMAL LOW (ref 1.7–7.7)
Neutrophils Relative %: 31 %
Platelet Count: 108 10*3/uL — ABNORMAL LOW (ref 150–400)
RBC: 3.98 MIL/uL — ABNORMAL LOW (ref 4.22–5.81)
RDW: 13.2 % (ref 11.5–15.5)
WBC Count: 3.2 10*3/uL — ABNORMAL LOW (ref 4.0–10.5)
nRBC: 0 % (ref 0.0–0.2)

## 2021-02-22 LAB — LACTATE DEHYDROGENASE: LDH: 309 U/L — ABNORMAL HIGH (ref 98–192)

## 2021-02-22 LAB — CMP (CANCER CENTER ONLY)
ALT: 16 U/L (ref 0–44)
AST: 20 U/L (ref 15–41)
Albumin: 4.4 g/dL (ref 3.5–5.0)
Alkaline Phosphatase: 55 U/L (ref 38–126)
Anion gap: 7 (ref 5–15)
BUN: 32 mg/dL — ABNORMAL HIGH (ref 8–23)
CO2: 30 mmol/L (ref 22–32)
Calcium: 9.8 mg/dL (ref 8.9–10.3)
Chloride: 103 mmol/L (ref 98–111)
Creatinine: 1.68 mg/dL — ABNORMAL HIGH (ref 0.61–1.24)
GFR, Estimated: 42 mL/min — ABNORMAL LOW (ref >60)
Glucose, Bld: 93 mg/dL (ref 70–99)
Potassium: 4.3 mmol/L (ref 3.5–5.1)
Sodium: 140 mmol/L (ref 135–145)
Total Bilirubin: 0.4 mg/dL (ref 0.3–1.2)
Total Protein: 6.4 g/dL — ABNORMAL LOW (ref 6.5–8.1)

## 2021-02-22 LAB — SAVE SMEAR(SSMR), FOR PROVIDER SLIDE REVIEW

## 2021-02-22 NOTE — Progress Notes (Signed)
Hematology and Oncology Follow Up Visit  [name] [id number] [birth year] 75 y.o. 02/22/2021   Principle Diagnosis:   Right lower lobe pulmonary nodule- non-malignant  Recurrent pulmonary embolism  CLL -- progressive -- 11q-/13q-  Current Therapy:    Coumadin 5 mg po q day  Calquence 100 mg po BID -- start on 03/13/2020  Venetoclax 200 mg po q day -- start in August 2021     Interim History:  Mr.  Sowash is back for follow-up.  He is doing quite well.  He really has no complaints.  He does have a tape on 2 of his fingers.  The index finger and third finger on the left hand are taped together.  He was with his dog when this happened.  He still plan to go to [place].  He lives on [address]  He is doing well with the venetoclax and acalabrutinib.  So far, he has had no complications from treatment.  There is no problems with nausea or vomiting.  He has had no problems with cough or shortness of breath.  He has had no change in bowel or bladder habits.  He is on Coumadin.  He has been followed by cardiology.  He has had no leg swelling.  Overall, his performance status is ECOG 1.     Medications:  Current Outpatient Medications:  .  acalabrutinib (CALQUENCE) 100 MG capsule, Take 1 capsule (100 mg total) by mouth 2 (two) times daily., Disp: 60 capsule, Rfl: 6 .  allopurinol (ZYLOPRIM) 100 MG tablet, TAKE 1 TABLET BY MOUTH EVERY DAY, Disp: 90 tablet, Rfl: 0 .  amoxicillin (AMOXIL) 500 MG capsule, Take 500 mg by mouth 2 (two) times daily. Prior to dental procedures., Disp: , Rfl:  .  cholecalciferol (VITAMIN D) 1000 units tablet, Take 1,000 Units by mouth daily., Disp: , Rfl:  .  hydrochlorothiazide (HYDRODIURIL) 12.5 MG tablet, TAKE 1 TABLET BY MOUTH EVERY DAY, Disp: 90 tablet, Rfl: 1 .  ipratropium (ATROVENT) 0.03 % nasal spray, Place 2 sprays into both nostrils every 12 (twelve) hours., Disp: 30 mL, Rfl: 12 .  LORazepam (ATIVAN) 0.5 MG tablet, TAKE 1 TABLET BY MOUTH EVERY  DAY AS NEEDED FOR ANXIETY, Disp: 30 tablet, Rfl: 2 .  Meclizine HCl 25 MG CHEW, TAKE 1 TABLET EVERY DAY AS NEEDED FOR DIZZINESS, Disp: 30 tablet, Rfl: 2 .  omeprazole (PRILOSEC) 40 MG capsule, Take 40 mg by mouth daily., Disp: , Rfl:  .  rosuvastatin (CRESTOR) 10 MG tablet, TAKE 1 TABLET BY MOUTH EVERY DAY, Disp: 90 tablet, Rfl: 3 .  senna (SENOKOT) 8.6 MG tablet, Take 2 tablets by mouth daily., Disp: , Rfl:  .  sertraline (ZOLOFT) 100 MG tablet, Take 100 mg by mouth daily., Disp: , Rfl:  .  triamcinolone cream (KENALOG) 0.5 %, Apply 1 application topically 3 (three) times daily for 14 days., Disp: 30 g, Rfl: 0 .  venetoclax (VENCLEXTA) 100 MG tablet, Take 2 tablets (200 mg total) by mouth daily., Disp: 60 tablet, Rfl: 3 .  warfarin (COUMADIN) 5 MG tablet, TAKE AS DIRECTED BY COUMADIN CLINIC, Disp: 160 tablet, Rfl: 1  Allergies:  Allergies  Allergen Reactions  . Hydrocodone-Acetaminophen Shortness Of Breath  . Oxycodone Hcl Shortness Of Breath  . Telmisartan-Hctz Shortness Of Breath and Palpitations    Dizziness, too Dizziness, too  . No Known Allergies Nausea And Vomiting  . Ramipril   . Aspirin Other (See Comments) and Rash    Other Reaction: confusion Can't  take high dose  . Atorvastatin Other (See Comments)    myalgia  . Buspirone Hcl Other (See Comments)    headache  . Buspirone Hcl Other (See Comments)    headache  . Codeine Nausea Only, Other (See Comments), Hives and Nausea And Vomiting    Other Reaction: confusion  . Codeine Phosphate Itching and Nausea Only  . Morphine Sulfate Itching  . Paroxetine Other (See Comments)    Paresthesias: R arm, R leg, r side of face  . Rabeprazole Other (See Comments)    headache    Past Medical History, Surgical history, Social history, and Family History were reviewed and updated.  Review of Systems: Review of Systems  Constitutional: Negative.   HENT: Negative.   Eyes: Negative.   Respiratory: Negative.   Cardiovascular:  Negative.   Gastrointestinal: Negative.   Genitourinary: Negative.   Musculoskeletal: Negative.   Skin: Negative.   Neurological: Negative.   Endo/Heme/Allergies: Negative.   Psychiatric/Behavioral: Negative.     Physical Exam:  weight is 204 lb (92.5 kg). His oral temperature is 98.4 F (36.9 C). His blood pressure is 126/89 and his pulse is 73. His respiration is 16 and oxygen saturation is 98%.   Physical Exam Vitals reviewed.  Constitutional:      Comments: Axillary exam does not show any palpable adenopathy in either axilla. Marland Kitchen    HENT:     Head: Normocephalic and atraumatic.  Eyes:     Pupils: Pupils are equal, round, and reactive to light.  Neck:     Comments: On his neck exam, he has had nice regression of his lymphadenopathy.  I cannot palpate any adenopathy in the cervical or supraclavicular chains. Cardiovascular:     Rate and Rhythm: Normal rate and regular rhythm.     Heart sounds: Normal heart sounds.  Pulmonary:     Effort: Pulmonary effort is normal.     Breath sounds: Normal breath sounds.  Abdominal:     General: Bowel sounds are normal.     Palpations: Abdomen is soft.  Musculoskeletal:        General: No tenderness or deformity. Normal range of motion.     Cervical back: Normal range of motion.  Lymphadenopathy:     Cervical: No cervical adenopathy.  Skin:    General: Skin is warm and dry.     Findings: No erythema or rash.  Neurological:     Mental Status: He is alert and oriented to person, place, and time.  Psychiatric:        Behavior: Behavior normal.        Thought Content: Thought content normal.        Judgment: Judgment normal.     Lab Results  Component Value Date   WBC 3.2 (L) 02/22/2021   HGB 13.0 02/22/2021   HCT 38.1 (L) 02/22/2021   MCV 95.7 02/22/2021   PLT 108 (L) 02/22/2021     Chemistry      Component Value Date/Time   NA 140 02/22/2021 1418   NA 145 07/22/2017 0756   NA 139 06/27/2016 1410   K 4.3 02/22/2021 1418    K 4.3 07/22/2017 0756   K 5.1 06/27/2016 1410   CL 103 02/22/2021 1418   CL 105 07/22/2017 0756   CO2 30 02/22/2021 1418   CO2 30 07/22/2017 0756   CO2 28 06/27/2016 1410   BUN 32 (H) 02/22/2021 1418   BUN 17 07/22/2017 0756   BUN 20.9 06/27/2016 1410  CREATININE 1.68 (H) 02/22/2021 1418   CREATININE 1.6 (H) 07/22/2017 0756   CREATININE 1.6 (H) 06/27/2016 1410      Component Value Date/Time   CALCIUM 9.8 02/22/2021 1418   CALCIUM 9.6 07/22/2017 0756   CALCIUM 9.6 06/27/2016 1410   ALKPHOS 55 02/22/2021 1418   ALKPHOS 59 07/22/2017 0756   ALKPHOS 62 06/27/2016 1410   AST 20 02/22/2021 1418   AST 18 06/27/2016 1410   ALT 16 02/22/2021 1418   ALT 24 07/22/2017 0756   ALT 24 06/27/2016 1410   BILITOT 0.4 02/22/2021 1418   BILITOT 0.46 06/27/2016 1410      Impression and Plan: Mr. Santibanez is 75 year-old African-American gentleman.  He has recurrent CLL.    I think is doing quite well with the combination.  When we see him back, I will run a flow cytometry on his peripheral blood to see if there is a monoclonal population of B cells.  Hopefully, we will not find a monoclonal population.  We will continue him on the venetoclax and acalabrutinib.  I will plan to get him back to see [name] in another 2 months.  I do not see that we have to do any scans on him right now.   Volanda Napoleon, MD 4/14/20223:05 PM

## 2021-02-26 ENCOUNTER — Other Ambulatory Visit: Payer: Self-pay | Admitting: Family Medicine

## 2021-02-26 NOTE — Telephone Encounter (Signed)
Last Refill 01/10/2021 Last OV 11/28/2020 dx Acute Knee Pain

## 2021-02-27 ENCOUNTER — Other Ambulatory Visit: Payer: Self-pay | Admitting: Family Medicine

## 2021-02-27 ENCOUNTER — Telehealth: Payer: Self-pay

## 2021-02-27 ENCOUNTER — Ambulatory Visit (INDEPENDENT_AMBULATORY_CARE_PROVIDER_SITE_OTHER): Payer: Medicare Other | Admitting: Psychology

## 2021-02-27 DIAGNOSIS — F4321 Adjustment disorder with depressed mood: Secondary | ICD-10-CM

## 2021-02-27 NOTE — Telephone Encounter (Signed)
Called pt with appt per 02/22/21 los   [employer]

## 2021-03-12 ENCOUNTER — Ambulatory Visit (INDEPENDENT_AMBULATORY_CARE_PROVIDER_SITE_OTHER): Payer: Medicare Other | Admitting: *Deleted

## 2021-03-12 ENCOUNTER — Other Ambulatory Visit: Payer: Self-pay | Admitting: Hematology & Oncology

## 2021-03-12 ENCOUNTER — Other Ambulatory Visit: Payer: Self-pay

## 2021-03-12 DIAGNOSIS — Z5181 Encounter for therapeutic drug level monitoring: Secondary | ICD-10-CM

## 2021-03-12 DIAGNOSIS — I480 Paroxysmal atrial fibrillation: Secondary | ICD-10-CM | POA: Diagnosis not present

## 2021-03-12 DIAGNOSIS — Z952 Presence of prosthetic heart valve: Secondary | ICD-10-CM | POA: Diagnosis not present

## 2021-03-12 DIAGNOSIS — I2699 Other pulmonary embolism without acute cor pulmonale: Secondary | ICD-10-CM | POA: Diagnosis not present

## 2021-03-12 LAB — POCT INR: INR: 3 (ref 2.0–3.0)

## 2021-03-12 NOTE — Patient Instructions (Signed)
Description   Continue on same dosage 1.5 tablets daily except 2 tablets on Fridays. Recheck INR in 4 weeks. Please call with any questions or concerns please call Coumadin Clinic [phone] Main [phone]     

## 2021-03-16 ENCOUNTER — Other Ambulatory Visit: Payer: Self-pay

## 2021-03-16 ENCOUNTER — Ambulatory Visit (INDEPENDENT_AMBULATORY_CARE_PROVIDER_SITE_OTHER): Payer: No Typology Code available for payment source

## 2021-03-16 DIAGNOSIS — Z952 Presence of prosthetic heart valve: Secondary | ICD-10-CM

## 2021-03-16 DIAGNOSIS — I480 Paroxysmal atrial fibrillation: Secondary | ICD-10-CM | POA: Diagnosis not present

## 2021-03-16 DIAGNOSIS — Z5181 Encounter for therapeutic drug level monitoring: Secondary | ICD-10-CM | POA: Diagnosis not present

## 2021-03-16 DIAGNOSIS — I2699 Other pulmonary embolism without acute cor pulmonale: Secondary | ICD-10-CM | POA: Diagnosis not present

## 2021-03-16 LAB — POCT INR: INR: 3 (ref 2.0–3.0)

## 2021-03-16 NOTE — Patient Instructions (Signed)
-   Continue on same dosage 1.5 tablets daily except 2 tablets on Fridays.  - Recheck INR as scheduled. Please call with any questions or concerns please call Coumadin Clinic [phone] Main [phone]

## 2021-03-18 ENCOUNTER — Other Ambulatory Visit: Payer: Self-pay | Admitting: Cardiovascular Disease

## 2021-03-22 ENCOUNTER — Ambulatory Visit: Payer: Medicare Other | Admitting: Psychology

## 2021-03-28 ENCOUNTER — Other Ambulatory Visit: Payer: Self-pay | Admitting: *Deleted

## 2021-03-28 ENCOUNTER — Encounter: Payer: Self-pay | Admitting: Hematology & Oncology

## 2021-03-28 DIAGNOSIS — C9110 Chronic lymphocytic leukemia of B-cell type not having achieved remission: Secondary | ICD-10-CM

## 2021-03-28 MED ORDER — VENETOCLAX 100 MG PO TABS: 200.0000 mg | ORAL_TABLET | Freq: Every day | ORAL | 3 refills | Status: DC

## 2021-04-04 ENCOUNTER — Other Ambulatory Visit: Payer: Self-pay | Admitting: Family Medicine

## 2021-04-08 ENCOUNTER — Other Ambulatory Visit: Payer: Self-pay

## 2021-04-08 ENCOUNTER — Emergency Department (HOSPITAL_COMMUNITY)
Admission: EM | Admit: 2021-04-08 | Discharge: 2021-04-08 | Disposition: A | Discharge status: Home / Self Care | Payer: Medicare Other | Attending: Emergency Medicine | Admitting: Emergency Medicine

## 2021-04-08 ENCOUNTER — Encounter (HOSPITAL_COMMUNITY): Payer: Self-pay | Admitting: Emergency Medicine

## 2021-04-08 DIAGNOSIS — I4891 Unspecified atrial fibrillation: Secondary | ICD-10-CM | POA: Insufficient documentation

## 2021-04-08 DIAGNOSIS — Z8546 Personal history of malignant neoplasm of prostate: Secondary | ICD-10-CM | POA: Diagnosis not present

## 2021-04-08 DIAGNOSIS — Z87891 Personal history of nicotine dependence: Secondary | ICD-10-CM | POA: Insufficient documentation

## 2021-04-08 DIAGNOSIS — Z79899 Other long term (current) drug therapy: Secondary | ICD-10-CM | POA: Diagnosis not present

## 2021-04-08 DIAGNOSIS — I129 Hypertensive chronic kidney disease with stage 1 through stage 4 chronic kidney disease, or unspecified chronic kidney disease: Secondary | ICD-10-CM | POA: Diagnosis not present

## 2021-04-08 DIAGNOSIS — R040 Epistaxis: Secondary | ICD-10-CM | POA: Insufficient documentation

## 2021-04-08 DIAGNOSIS — N183 Chronic kidney disease, stage 3 unspecified: Secondary | ICD-10-CM | POA: Diagnosis not present

## 2021-04-08 DIAGNOSIS — Z7901 Long term (current) use of anticoagulants: Secondary | ICD-10-CM | POA: Insufficient documentation

## 2021-04-08 LAB — CBC WITH DIFFERENTIAL/PLATELET
Abs Immature Granulocytes: 0.02 K/uL (ref 0.00–0.07)
Basophils Absolute: 0 K/uL (ref 0.0–0.1)
Basophils Relative: 1 %
Eosinophils Absolute: 0 K/uL (ref 0.0–0.5)
Eosinophils Relative: 1 %
HCT: 39.5 % (ref 39.0–52.0)
Hemoglobin: 13.3 g/dL (ref 13.0–17.0)
Immature Granulocytes: 1 %
Lymphocytes Relative: 41 %
Lymphs Abs: 1.3 K/uL (ref 0.7–4.0)
MCH: 33.4 pg (ref 26.0–34.0)
MCHC: 33.7 g/dL (ref 30.0–36.0)
MCV: 99.2 fL (ref 80.0–100.0)
Monocytes Absolute: 0.7 K/uL (ref 0.1–1.0)
Monocytes Relative: 23 %
Neutro Abs: 1 K/uL — ABNORMAL LOW (ref 1.7–7.7)
Neutrophils Relative %: 33 %
Platelets: 114 K/uL — ABNORMAL LOW (ref 150–400)
RBC: 3.98 MIL/uL — ABNORMAL LOW (ref 4.22–5.81)
RDW: 13.2 % (ref 11.5–15.5)
WBC: 3 K/uL — ABNORMAL LOW (ref 4.0–10.5)
nRBC: 0 % (ref 0.0–0.2)

## 2021-04-08 LAB — BASIC METABOLIC PANEL
Anion gap: 5 (ref 5–15)
BUN: 24 mg/dL — ABNORMAL HIGH (ref 8–23)
CO2: 28 mmol/L (ref 22–32)
Calcium: 8.8 mg/dL — ABNORMAL LOW (ref 8.9–10.3)
Chloride: 107 mmol/L (ref 98–111)
Creatinine, Ser: 1.53 mg/dL — ABNORMAL HIGH (ref 0.61–1.24)
GFR, Estimated: 47 mL/min — ABNORMAL LOW (ref >60)
Glucose, Bld: 110 mg/dL — ABNORMAL HIGH (ref 70–99)
Potassium: 4.1 mmol/L (ref 3.5–5.1)
Sodium: 140 mmol/L (ref 135–145)

## 2021-04-08 LAB — PROTIME-INR
INR: 3.8 — ABNORMAL HIGH (ref 0.8–1.2)
Prothrombin Time: 37.2 s — ABNORMAL HIGH (ref 11.4–15.2)

## 2021-04-08 MED ORDER — ONDANSETRON 4 MG PO TBDP
4.0000 mg | ORAL_TABLET | Freq: Once | ORAL | Status: AC
  Administered 2021-04-08: 4 mg via ORAL
  Filled 2021-04-08: qty 1

## 2021-04-08 MED ORDER — OXYMETAZOLINE HCL 0.05 % NA SOLN
1.0000 | Freq: Once | NASAL | Status: AC
  Administered 2021-04-08: 1 via NASAL
  Filled 2021-04-08: qty 30

## 2021-04-08 MED ORDER — ONDANSETRON HCL 4 MG/2ML IJ SOLN: 4.0000 mg | Freq: Once | INTRAMUSCULAR | Status: DC

## 2021-04-08 NOTE — ED Provider Notes (Signed)
[place] EMERGENCY DEPARTMENT Provider Note   CSN: [id number] Arrival date & time: 04/08/21  0732     History Chief Complaint  Patient presents with  . Epistaxis    [name] is a 75 y.o. male.  HPI   75 year old male with past medical history of CLL, previous PE anticoagulated on Coumadin, HTN, HLD presents the emergency department with nosebleed.  Patient states he periodically gets nosebleeds, they usually subside at home with pressure.  However when he woke up this morning he had blood dripping on the left nostril that has not stopped with direct pressure.  He states he is been taking his Coumadin as directed, he believes his last INR was appropriate.  He is complaining of mild nausea due to swallowing blood but denies any chest pain, shortness of breath, lightheadedness.  Past Medical History:  Diagnosis Date  . Anticoagulants causing adverse effect in therapeutic use   . Anxiety    Paxil seemed to cause odd neuro side effects; better on prozac as of 09/2015  . Aortic regurgitation 04/2007 surgery   Resolved with tissue AVR at [facility]  . Cervical spondylosis    ESI by [name] in Ortho. 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 Chronic lymphocytic leukemia (CLL), B-cell ([place]) approx 2012   stable on f/u's with [name] (most recent 07/2017)  . History of prostate cancer 2003   Rad prost  . History of pulmonary embolism 2011; 10/2014   Recurrence off of anticoagulation 10/2014: per hem/onc ([name]) pt needs lifelong anticoagulation (hypercoag w/u neg).  . Hyperlipidemia    statin-intolerant, except for crestor low dose.  Marland Kitchen Hypertension   . Migraine syndrome   . Mitral regurgitation   . PAF (paroxysmal atrial fibrillation) ([place])   . Panic attacks    "           "             "                  "                  "                       "                             "                         . Pulmonary nodule, right 2015   RLL: resected at [facility]  --VATS; Benign RLL nodule (fungal and AFB stains neg).  Plan is for [facility] thoracic surg to do f/u o/v & CT chest 1 yr   . Right-sided headache 07/2016   Primary stabbing HA or migraine per neuro (Dr. Tomi Likens).  Gabapentin and topamax not tolerated.  These resolved spontaneously.    Patient Active Problem List   Diagnosis Date Noted  . Encounter for therapeutic drug monitoring 09/21/2019  . PTSD (post-traumatic stress disorder) 09/29/2018  . BPPV (benign paroxysmal positional vertigo) 09/04/2017  . CKD (chronic kidney disease), stage III ([place]) 08/11/2017  . Former smoker 08/29/2016  . Cervical spondylosis   . S/P Bioprosthetic AVR (aortic valve replacement) in 2011 03/22/2015  . Recurrent pulmonary embolism ([place]) 11/09/2014  . Lung nodule 11/02/2014  . Chronic lymphocytic leukemia ([place]) 11/11/2013  .  Migraine 10/08/2010  . GERD 12/11/2009  . MITRAL REGURGITATION 04/07/2009  . Insomnia 02/03/2008  . Anxiety state 09/06/2007  . HLD (hyperlipidemia) 04/20/2007  . Essential hypertension 04/20/2007  . Atrial fibrillation ([place]) 04/20/2007  . PROSTATE CANCER, HX OF 04/20/2007    Past Surgical History:  Procedure Laterality Date  . AORTIC VALVE REPLACEMENT  2011   [facility]  (bioprosthetic)  . CARDIAC CATHETERIZATION  04/2010   Normal coronaries.  . Carotid dopplers  08/2015   NORMAL  . CATARACT EXTRACTION     L 12/06/09   R 09/14/09  . COLONOSCOPY  01/26/13   tic's, o/w normal.  ([place])--recall 10 yrs.  Marland Kitchen PENILE PROSTHESIS IMPLANT    . PROSTATECTOMY  04/2002   for prostate cancer  . Pulmonary nodule resection  Fall/winter 2016   [facility]  . SHOULDER SURGERY     rotator cuff on both arms   . TIBIALIS TENDON TRANSFER / REPAIR Right 03/02/2019   Procedure: RECONSTRUCTION OF RIGHT  ANTERIOR TIBIALIS TENDON;  Surgeon: Erle Crocker, MD;  Location: [place];  Service: Orthopedics;  Laterality: Right;  . TRANSTHORACIC ECHOCARDIOGRAM  09/20/2014; 03/2016; 03/2017   2015-mild  LVH, EF 50-55%, wall motion nl, grade I diast dysfxn, prosth aort valve good,transaortic gradients decreased compared to echo 11/2013.   03/2016: EF 55-60%, normal LV function, severe LVH, normal diast fxn, mild increase in AV gradient compared to 09/2014 echo.  2018: EF 55-60%, normal LV fxn, grd II DD, AV stable/gradient's stable.       Family History  Problem Relation Age of Onset  . Cancer Sister        uterine  . Colon cancer Sister 6  . Stroke Mother   . Prostate cancer Father   . Stroke Sister   . Stroke Brother   . Heart attack Neg Hx     Social History   Tobacco Use  . Smoking status: Former Smoker    Packs/day: 1.00    Years: 12.00    Pack years: 12.00    Types: Cigarettes    Start date: 11/28/1968    Quit date: 01/29/1981    Years since quitting: 40.2  . Smokeless tobacco: Never Used  . Tobacco comment: quit 75 yo   Vaping Use  . Vaping Use: Never used  Substance Use Topics  . Alcohol use: Yes    Alcohol/week: 0.0 standard drinks    Comment: occasional  . Drug use: No    Home Medications Prior to Admission medications   Medication Sig Start Date End Date Taking? Authorizing Provider  acalabrutinib (CALQUENCE) 100 MG capsule Take 1 capsule (100 mg total) by mouth 2 (two) times daily. 11/27/20   Volanda Napoleon, MD  allopurinol (ZYLOPRIM) 100 MG tablet TAKE 1 TABLET BY MOUTH EVERY DAY 03/12/21   Volanda Napoleon, MD  amoxicillin (AMOXIL) 500 MG capsule Take 500 mg by mouth 2 (two) times daily. Prior to dental procedures.    [provider]  cholecalciferol (VITAMIN D) 1000 units tablet Take 1,000 Units by mouth daily.    [provider]  hydrochlorothiazide (HYDRODIURIL) 12.5 MG tablet TAKE 1 TABLET BY MOUTH EVERY DAY 04/04/21   [name], MD  ipratropium (ATROVENT) 0.03 % nasal spray Place 2 sprays into both nostrils every 12 (twelve) hours. 10/04/20   Inda Coke, PA  LORazepam (ATIVAN) 0.5 MG tablet TAKE 1 TABLET BY MOUTH EVERY DAY  AS NEEDED FOR ANXIETY 02/26/21   [name], MD  Meclizine HCl 25 MG CHEW CHEW 1 TABLET EVERY DAY AS NEEDED FOR DIZZINESS 02/27/21   [name], MD  omeprazole (PRILOSEC) 40 MG capsule Take 40 mg by mouth daily. 10/18/20   [provider]  rosuvastatin (CRESTOR) 10 MG tablet TAKE 1 TABLET BY MOUTH EVERY DAY 11/01/20   [name], MD  senna (SENOKOT) 8.6 MG tablet Take 2 tablets by mouth daily. 03/07/16   [provider]  sertraline (ZOLOFT) 100 MG tablet Take 100 mg by mouth daily.    [provider]  venetoclax (VENCLEXTA) 100 MG tablet Take 2 tablets (200 mg total) by mouth daily. 03/28/21   Volanda Napoleon, MD  warfarin (COUMADIN) 5 MG tablet TAKE AS DIRECTED BY COUMADIN CLINIC 03/19/21   Josue Hector, MD    Allergies    Hydrocodone-acetaminophen, Oxycodone hcl, Telmisartan-hctz, No known allergies, Ramipril, Aspirin, Atorvastatin, Buspirone hcl, Buspirone hcl, Codeine, Codeine phosphate, Morphine sulfate, Paroxetine, and Rabeprazole  Review of Systems   Review of Systems  Constitutional: Negative for chills and fever.  HENT: Positive for nosebleeds.   Respiratory: Negative for shortness of breath.   Cardiovascular: Negative for chest pain.  Gastrointestinal: Negative for abdominal pain, diarrhea and vomiting.  Skin: Negative for rash.  Neurological: Negative for light-headedness and headaches.    Physical Exam Updated Vital Signs BP 119/87 (BP Location: Right Arm)   Pulse 73   Temp 97.9 F (36.6 C)   Resp 18   SpO2 98%   Physical Exam Vitals and nursing note reviewed.  Constitutional:      Appearance: Normal appearance.  HENT:     Head: Normocephalic.     Nose:     Comments: Dried blood at the left nares, no bleeding anteriorly, no noted bleeding vessel in the anterior area    Mouth/Throat:     Mouth: Mucous membranes are moist.     Comments: Bright red blood in the posterior pharynx, no clots Cardiovascular:     Rate and  Rhythm: Normal rate.  Pulmonary:     Effort: Pulmonary effort is normal. No respiratory distress.  Abdominal:     Palpations: Abdomen is soft.     Tenderness: There is no abdominal tenderness.  Skin:    General: Skin is warm.  Neurological:     Mental Status: He is alert and oriented to person, place, and time. Mental status is at baseline.  Psychiatric:        Mood and Affect: Mood normal.     ED Results / Procedures / Treatments   Labs (all labs ordered are listed, but only abnormal results are displayed) Labs Reviewed  CBC WITH DIFFERENTIAL/PLATELET  PROTIME-INR  BASIC METABOLIC PANEL    EKG None  Radiology No results found.  Procedures Procedures   Medications Ordered in ED Medications  ondansetron (ZOFRAN) injection 4 mg (has no administration in time range)  oxymetazoline (AFRIN) 0.05 % nasal spray 1 spray (has no administration in time range)    ED Course  I have reviewed the triage vital signs and the nursing notes.  Pertinent labs & imaging results that were available during my care of the patient were reviewed by me and considered in my medical decision making (see chart for details).    MDM Rules/Calculators/A&P                          75 year old male who is anticoagulated presents emergency department with left-sided nosebleed.  Vitals  are stable on arrival.  He has active bleeding of the posterior oropharynx but no anterior bleeding, no signs of hemorrhage.  Plan to check blood work, INR and control bleeding.  Afrin soaked cotton was placed in the left naris and direct pressure was held.  When this packing was removed there was no further active bleeding.  INR is slightly therapeutic at 3.8.  Patient currently takes 7.5 of Coumadin nightly.  I have advised that he goes down to 5 mg for the next 2 days and he is having a repeat INR drawn later this week.  He understands.  This has been listed on his discharge papers.  Patient will be discharged and  treated as an outpatient.  Discharge plan and strict return to ED precautions discussed, patient verbalizes understanding and agreement.  Final Clinical Impression(s) / ED Diagnoses Final diagnoses:  None    Rx / DC Orders ED Discharge Orders    None       Lorelle Gibbs, DO 04/08/21 1034

## 2021-04-08 NOTE — Discharge Instructions (Addendum)
You have been seen and discharged from the emergency department.  Your nosebleed was treated with Afrin soaked cotton balls and direct pressure.  Your blood work showed that your INR was slightly elevated at 3.8.  Recommendation is to decrease your Coumadin dose to 1 pill in the evening for today (5/29) and tomorrow Monday (5/30).  Then you may resume your normal regimen and have repeat outpatient blood work to evaluate for the INR.  Follow-up with your primary provider for reevaluation and further care. Take other home medications as prescribed. If you have any worsening symptoms or further concerns for your health please return to an emergency department for further evaluation.

## 2021-04-08 NOTE — ED Notes (Signed)
Patient returned from MRI.

## 2021-04-08 NOTE — ED Triage Notes (Signed)
C/o nosebleed x 1 hour.  States mostly on the left nostril.  Takes Coumadin.  Reports dizziness.

## 2021-04-11 ENCOUNTER — Ambulatory Visit (INDEPENDENT_AMBULATORY_CARE_PROVIDER_SITE_OTHER): Payer: Medicare Other

## 2021-04-11 ENCOUNTER — Other Ambulatory Visit: Payer: Self-pay

## 2021-04-11 DIAGNOSIS — Z5181 Encounter for therapeutic drug level monitoring: Secondary | ICD-10-CM | POA: Diagnosis not present

## 2021-04-11 DIAGNOSIS — Z952 Presence of prosthetic heart valve: Secondary | ICD-10-CM | POA: Diagnosis not present

## 2021-04-11 DIAGNOSIS — I480 Paroxysmal atrial fibrillation: Secondary | ICD-10-CM | POA: Diagnosis not present

## 2021-04-11 DIAGNOSIS — I2699 Other pulmonary embolism without acute cor pulmonale: Secondary | ICD-10-CM | POA: Diagnosis not present

## 2021-04-11 LAB — POCT INR: INR: 3.8 — AB (ref 2.0–3.0)

## 2021-04-11 NOTE — Patient Instructions (Signed)
Description   Take 1/2 tablet today, then start taking 1.5 tablets daily except 1 tablet on Mondays.  Recheck in 1 week.  Please call with any questions or concerns please call Coumadin Clinic [phone] Main [phone]

## 2021-04-17 ENCOUNTER — Ambulatory Visit (INDEPENDENT_AMBULATORY_CARE_PROVIDER_SITE_OTHER): Payer: Medicare Other | Admitting: Psychology

## 2021-04-17 ENCOUNTER — Encounter: Payer: Self-pay | Admitting: Family Medicine

## 2021-04-17 DIAGNOSIS — F4321 Adjustment disorder with depressed mood: Secondary | ICD-10-CM | POA: Diagnosis not present

## 2021-04-18 ENCOUNTER — Telehealth: Payer: Self-pay

## 2021-04-18 ENCOUNTER — Other Ambulatory Visit: Payer: Self-pay

## 2021-04-18 ENCOUNTER — Inpatient Hospital Stay: Payer: Medicare Other | Attending: Hematology & Oncology

## 2021-04-18 ENCOUNTER — Encounter: Payer: Self-pay | Admitting: Hematology & Oncology

## 2021-04-18 ENCOUNTER — Inpatient Hospital Stay (HOSPITAL_BASED_OUTPATIENT_CLINIC_OR_DEPARTMENT_OTHER): Payer: Medicare Other | Admitting: Hematology & Oncology

## 2021-04-18 VITALS — BP 130/86 | HR 70 | Temp 99.1°F | Resp 18 | Wt 200.8 lb

## 2021-04-18 DIAGNOSIS — R911 Solitary pulmonary nodule: Secondary | ICD-10-CM | POA: Diagnosis not present

## 2021-04-18 DIAGNOSIS — C9110 Chronic lymphocytic leukemia of B-cell type not having achieved remission: Secondary | ICD-10-CM

## 2021-04-18 DIAGNOSIS — Z79899 Other long term (current) drug therapy: Secondary | ICD-10-CM | POA: Insufficient documentation

## 2021-04-18 DIAGNOSIS — Z7901 Long term (current) use of anticoagulants: Secondary | ICD-10-CM | POA: Diagnosis not present

## 2021-04-18 DIAGNOSIS — I2699 Other pulmonary embolism without acute cor pulmonale: Secondary | ICD-10-CM | POA: Diagnosis not present

## 2021-04-18 LAB — CMP (CANCER CENTER ONLY)
ALT: 14 U/L (ref 0–44)
AST: 17 U/L (ref 15–41)
Albumin: 4.3 g/dL (ref 3.5–5.0)
Alkaline Phosphatase: 57 U/L (ref 38–126)
Anion gap: 7 (ref 5–15)
BUN: 30 mg/dL — ABNORMAL HIGH (ref 8–23)
CO2: 30 mmol/L (ref 22–32)
Calcium: 9.6 mg/dL (ref 8.9–10.3)
Chloride: 103 mmol/L (ref 98–111)
Creatinine: 1.67 mg/dL — ABNORMAL HIGH (ref 0.61–1.24)
GFR, Estimated: 42 mL/min — ABNORMAL LOW (ref >60)
Glucose, Bld: 98 mg/dL (ref 70–99)
Potassium: 4.2 mmol/L (ref 3.5–5.1)
Sodium: 140 mmol/L (ref 135–145)
Total Bilirubin: 0.4 mg/dL (ref 0.3–1.2)
Total Protein: 6.2 g/dL — ABNORMAL LOW (ref 6.5–8.1)

## 2021-04-18 LAB — CBC WITH DIFFERENTIAL (CANCER CENTER ONLY)
Abs Immature Granulocytes: 0.02 10*3/uL (ref 0.00–0.07)
Basophils Absolute: 0 10*3/uL (ref 0.0–0.1)
Basophils Relative: 0 %
Eosinophils Absolute: 0 10*3/uL (ref 0.0–0.5)
Eosinophils Relative: 1 %
HCT: 36.1 % — ABNORMAL LOW (ref 39.0–52.0)
Hemoglobin: 12.1 g/dL — ABNORMAL LOW (ref 13.0–17.0)
Immature Granulocytes: 1 %
Lymphocytes Relative: 37 %
Lymphs Abs: 1.1 10*3/uL (ref 0.7–4.0)
MCH: 32.6 pg (ref 26.0–34.0)
MCHC: 33.5 g/dL (ref 30.0–36.0)
MCV: 97.3 fL (ref 80.0–100.0)
Monocytes Absolute: 0.6 10*3/uL (ref 0.1–1.0)
Monocytes Relative: 22 %
Neutro Abs: 1.1 10*3/uL — ABNORMAL LOW (ref 1.7–7.7)
Neutrophils Relative %: 39 %
Platelet Count: 112 10*3/uL — ABNORMAL LOW (ref 150–400)
RBC: 3.71 MIL/uL — ABNORMAL LOW (ref 4.22–5.81)
RDW: 13.4 % (ref 11.5–15.5)
WBC Count: 2.9 10*3/uL — ABNORMAL LOW (ref 4.0–10.5)
nRBC: 0 % (ref 0.0–0.2)

## 2021-04-18 LAB — SAVE SMEAR(SSMR), FOR PROVIDER SLIDE REVIEW

## 2021-04-18 NOTE — Telephone Encounter (Signed)
appts made per 04/18/21 los, pt aware that he will get a call with [facility] appts and will view in [employer]

## 2021-04-18 NOTE — Progress Notes (Signed)
Hematology and Oncology Follow Up Visit  [name] [id number] February 25, 1946 75 y.o. 04/18/2021   Principle Diagnosis:   Right lower lobe pulmonary nodule- non-malignant  Recurrent pulmonary embolism  CLL -- progressive -- 11q-/13q-  Current Therapy:    Coumadin 5 mg po q day  Calquence 100 mg po BID -- start on 03/13/2020  Venetoclax 200 mg po q day -- start in August 2021     Interim History:  Mr.  [name] is back for follow-up.  He looks quite good.  He feels okay.  Unfortunate, he did have I think a dislocation of one of his fingers on the left hand with his dog.  This is seems to be improving right now.  Had a wonderful time out in [place].  He was out there for about a week or so.  He really enjoyed himself.  He might be going to [place] in the fall.  I think he has some friends out in the [place] that he would like to see.  He has done incredibly well with the Calquence and the venetoclax.  At this point, we are at the point that we will see if we can get him off treatment.  I we will set him up with a bone marrow biopsy in August to see if he has minimal residual disease.  He has had no problems with bowels or bladder.  He is still being followed with Dr. [name] of cardiology for his valvular issues.  There has been no fever.  He has had no cough or shortness of breath.  He has had no rashes.  He exercises.  He stays in great shape.  Overall, his performance status is ECOG 1.      Medications:  Current Outpatient Medications:  .  acalabrutinib (CALQUENCE) 100 MG capsule, Take 1 capsule (100 mg total) by mouth 2 (two) times daily., Disp: 60 capsule, Rfl: 6 .  allopurinol (ZYLOPRIM) 100 MG tablet, TAKE 1 TABLET BY MOUTH EVERY DAY, Disp: 90 tablet, Rfl: 0 .  amoxicillin (AMOXIL) 500 MG capsule, Take 500 mg by mouth 2 (two) times daily. Prior to dental procedures., Disp: , Rfl:  .  cholecalciferol (VITAMIN D) 1000 units tablet, Take 1,000 Units by mouth  daily., Disp: , Rfl:  .  hydrochlorothiazide (HYDRODIURIL) 12.5 MG tablet, TAKE 1 TABLET BY MOUTH EVERY DAY, Disp: 90 tablet, Rfl: 1 .  ipratropium (ATROVENT) 0.03 % nasal spray, Place 2 sprays into both nostrils every 12 (twelve) hours., Disp: 30 mL, Rfl: 12 .  LORazepam (ATIVAN) 0.5 MG tablet, TAKE 1 TABLET BY MOUTH EVERY DAY AS NEEDED FOR ANXIETY, Disp: 30 tablet, Rfl: 2 .  Meclizine HCl 25 MG CHEW, CHEW 1 TABLET EVERY DAY AS NEEDED FOR DIZZINESS, Disp: 30 tablet, Rfl: 2 .  omeprazole (PRILOSEC) 40 MG capsule, Take 40 mg by mouth daily., Disp: , Rfl:  .  rosuvastatin (CRESTOR) 10 MG tablet, TAKE 1 TABLET BY MOUTH EVERY DAY, Disp: 90 tablet, Rfl: 3 .  senna (SENOKOT) 8.6 MG tablet, Take 2 tablets by mouth daily., Disp: , Rfl:  .  sertraline (ZOLOFT) 100 MG tablet, Take 100 mg by mouth daily., Disp: , Rfl:  .  venetoclax (VENCLEXTA) 100 MG tablet, Take 2 tablets (200 mg total) by mouth daily., Disp: 60 tablet, Rfl: 3 .  warfarin (COUMADIN) 5 MG tablet, TAKE AS DIRECTED BY COUMADIN CLINIC, Disp: 160 tablet, Rfl: 1  Allergies:  Allergies  Allergen Reactions  . Hydrocodone-Acetaminophen Shortness Of Breath  .  Oxycodone Hcl Shortness Of Breath  . Telmisartan-Hctz Shortness Of Breath and Palpitations    Dizziness, too Dizziness, too  . No Known Allergies Nausea And Vomiting  . Ramipril   . Aspirin Other (See Comments) and Rash    Other Reaction: confusion Can't take high dose  . Atorvastatin Other (See Comments)    myalgia  . Buspirone Hcl Other (See Comments)    headache  . Buspirone Hcl Other (See Comments)    headache  . Codeine Nausea Only, Other (See Comments), Hives and Nausea And Vomiting    Other Reaction: confusion  . Codeine Phosphate Itching and Nausea Only  . Morphine Sulfate Itching  . Paroxetine Other (See Comments)    Paresthesias: R arm, R leg, r side of face  . Rabeprazole Other (See Comments)    headache    Past Medical History, Surgical history, Social history,  and Family History were reviewed and updated.  Review of Systems: Review of Systems  Constitutional: Negative.   HENT: Negative.   Eyes: Negative.   Respiratory: Negative.   Cardiovascular: Negative.   Gastrointestinal: Negative.   Genitourinary: Negative.   Musculoskeletal: Negative.   Skin: Negative.   Neurological: Negative.   Endo/Heme/Allergies: Negative.   Psychiatric/Behavioral: Negative.     Physical Exam:  weight is 91.1 kg. His oral temperature is 99.1 F (37.3 C). His blood pressure is 130/86 and his pulse is 70. His respiration is 18 and oxygen saturation is 100%.   Physical Exam Vitals reviewed.  Constitutional:      Comments: Axillary exam does not show any palpable adenopathy in either axilla. Marland Kitchen    HENT:     Head: Normocephalic and atraumatic.  Eyes:     Pupils: Pupils are equal, round, and reactive to light.  Neck:     Comments: On his neck exam, he has had nice regression of his lymphadenopathy.  I cannot palpate any adenopathy in the cervical or supraclavicular chains. Cardiovascular:     Rate and Rhythm: Normal rate and regular rhythm.     Heart sounds: Normal heart sounds.  Pulmonary:     Effort: Pulmonary effort is normal.     Breath sounds: Normal breath sounds.  Abdominal:     General: Bowel sounds are normal.     Palpations: Abdomen is soft.  Musculoskeletal:        General: No tenderness or deformity. Normal range of motion.     Cervical back: Normal range of motion.  Lymphadenopathy:     Cervical: No cervical adenopathy.  Skin:    General: Skin is warm and dry.     Findings: No erythema or rash.  Neurological:     Mental Status: He is alert and oriented to person, place, and time.  Psychiatric:        Behavior: Behavior normal.        Thought Content: Thought content normal.        Judgment: Judgment normal.     Lab Results  Component Value Date   WBC 2.9 (L) 04/18/2021   HGB 12.1 (L) 04/18/2021   HCT 36.1 (L) 04/18/2021   MCV  97.3 04/18/2021   PLT 112 (L) 04/18/2021     Chemistry      Component Value Date/Time   NA 140 04/18/2021 1348   NA 145 07/22/2017 0756   NA 139 06/27/2016 1410   K 4.2 04/18/2021 1348   K 4.3 07/22/2017 0756   K 5.1 06/27/2016 1410   CL 103  04/18/2021 1348   CL 105 07/22/2017 0756   CO2 30 04/18/2021 1348   CO2 30 07/22/2017 0756   CO2 28 06/27/2016 1410   BUN 30 (H) 04/18/2021 1348   BUN 17 07/22/2017 0756   BUN 20.9 06/27/2016 1410   CREATININE 1.67 (H) 04/18/2021 1348   CREATININE 1.6 (H) 07/22/2017 0756   CREATININE 1.6 (H) 06/27/2016 1410      Component Value Date/Time   CALCIUM 9.6 04/18/2021 1348   CALCIUM 9.6 07/22/2017 0756   CALCIUM 9.6 06/27/2016 1410   ALKPHOS 57 04/18/2021 1348   ALKPHOS 59 07/22/2017 0756   ALKPHOS 62 06/27/2016 1410   AST 17 04/18/2021 1348   AST 18 06/27/2016 1410   ALT 14 04/18/2021 1348   ALT 24 07/22/2017 0756   ALT 24 06/27/2016 1410   BILITOT 0.4 04/18/2021 1348   BILITOT 0.46 06/27/2016 1410      Impression and Plan: Mr. Sittner is 76 year-old African-American gentleman.  He has recurrent CLL.    I want to set him up with a bone marrow biopsy and aspirate in August.  Again I need to see if he has minimal residual disease and that way we can try to get him off therapy.  I would like to hope that he will have MRD.  I have to believe that he will have this.  We will continue him on the combination of Calquence and venetoclax for right now.  I will plan to see him back after he has the bone marrow biopsy done.   Marland KitchenVolanda Napoleon, MD 6/8/20223:06 PM

## 2021-04-19 ENCOUNTER — Ambulatory Visit: Payer: Medicare Other | Admitting: Psychology

## 2021-04-19 ENCOUNTER — Ambulatory Visit (INDEPENDENT_AMBULATORY_CARE_PROVIDER_SITE_OTHER): Payer: Medicare Other | Admitting: *Deleted

## 2021-04-19 DIAGNOSIS — Z952 Presence of prosthetic heart valve: Secondary | ICD-10-CM

## 2021-04-19 DIAGNOSIS — Z5181 Encounter for therapeutic drug level monitoring: Secondary | ICD-10-CM

## 2021-04-19 DIAGNOSIS — I480 Paroxysmal atrial fibrillation: Secondary | ICD-10-CM | POA: Diagnosis not present

## 2021-04-19 DIAGNOSIS — I2699 Other pulmonary embolism without acute cor pulmonale: Secondary | ICD-10-CM

## 2021-04-19 LAB — KAPPA/LAMBDA LIGHT CHAINS
Kappa free light chain: 22.1 mg/L — ABNORMAL HIGH (ref 3.3–19.4)
Kappa, lambda light chain ratio: 1.83 — ABNORMAL HIGH (ref 0.26–1.65)
Lambda free light chains: 12.1 mg/L (ref 5.7–26.3)

## 2021-04-19 LAB — IGG, IGA, IGM
IgA: 95 mg/dL (ref 61–437)
IgG (Immunoglobin G), Serum: 662 mg/dL (ref 603–1613)
IgM (Immunoglobulin M), Srm: 48 mg/dL (ref 15–143)

## 2021-04-19 LAB — POCT INR: INR: 2.6 (ref 2.0–3.0)

## 2021-04-19 NOTE — Patient Instructions (Signed)
Description   Continue taking Warfarin 1.5 tablets daily except 1 tablet on Mondays. Recheck in 2 weeks.  Please call with any questions or concerns please call Coumadin Clinic [phone] Main [phone]

## 2021-04-20 LAB — SURGICAL PATHOLOGY

## 2021-04-20 LAB — FLOW CYTOMETRY

## 2021-04-23 ENCOUNTER — Encounter: Payer: Self-pay | Admitting: Family Medicine

## 2021-04-25 NOTE — Progress Notes (Signed)
Date:  04/25/2021   ID:  [name], DOB Feb 08, 1946, MRN [id number]  Provider Location: Office  PCP:  [name], MD  Cardiologist:   [name] Electrophysiologist:  None   Evaluation Performed:  Follow-Up Visit  Chief Complaint:  AVR PAF   History of Present Illness:    75 y.o. with bioprosthetic AVR Dr [name] at [facility] July 2011 for severe AR No CAD. History of PAF and recurrent DVT/PE on chronic anticoagulation  Also had LUE cephalic/basilic thrombus. CRF;s include HTN and HLD. Has CLL followed by Dr [name] post chemoRx. Also had radical prostatectomy for cancer In 2016 had RLL wedge resection for granulomatous benign nodule Has had sleep issues with nightmares and we have since stopped his beta blocker including lopressor and atenolol He is seen at [facility] for PTSD and uses Zoloft   Had right foot reconstruction 03/02/19 involving the right gastrocnemius tendon for foot drop  Echo 08/27/19 showed increased gradients on his 25 mm CE bioprosthesis with CT showing HALT/HAM started on coumadin instead of xarelto Concern related to progression of his CLL and marantic. On coumadin gradients improved somewhat with mean 39-30 mmHg peak 65-55 mmHg by TTE 12/15/19 Latest echo 12/13/20 stable with mean gradient 27.5 and peak 46.2 mmHg DVI 0.24 AVA 1.2 cm2   Seen by PA 03/10/20 with dyspnea and palpitations Thought to be anxiety related  Soft tissue CT neck 03/15/20 with moderate bulky cervical lymphadenopathy new/progressed since 2019 Seen by Dr [name] 09/21/20 felt the venetoclax and acalabrutnib doing good job bringing lymphocyte count down from 91% to 51%  F/U CT  10/18/20 with significant improvement He is due to have BM biopsy in August   10/2020: Had some dental implant work increased hoarseness and seen by ENT  He has a new puppy Dillon a bearded collie and bruises easily on anticoagulation but told him safest to stay on coumadin not DOAC had dislocation of one of his fingers left hand   playing with dog  Doing well no cardiac complaints     Past Medical History:  Diagnosis Date   Anticoagulants causing adverse effect in therapeutic use    Anxiety    Paxil seemed to cause odd neuro side effects; better on prozac as of 09/2015   Aortic regurgitation 04/2007 surgery   Resolved with tissue AVR at [facility]   Cervical spondylosis    ESI by [name] in [place]. 92 years martial arts training   Chronic lymphocytic leukemia (CLL), B-cell ([place]) approx 2012   stable on f/u's with [name] (most recent 07/2017)   History of prostate cancer 2003   Rad prost   History of pulmonary embolism 2011; 10/2014   Recurrence off of anticoagulation 10/2014: per hem/onc ([name]) pt needs lifelong anticoagulation (hypercoag w/u neg).   Hyperlipidemia    statin-intolerant, except for crestor low dose.   Hypertension    Migraine syndrome    Mitral regurgitation    PAF (paroxysmal atrial fibrillation) (HCC)    Panic attacks    "           "             "                  "                  "                       "                             "  Pulmonary nodule, right 2015   RLL: resected at [facility] --VATS; Benign RLL nodule (fungal and AFB stains neg).  Plan is for [facility] thoracic surg to do f/u o/v & CT chest 1 yr    Right-sided headache 07/2016   Primary stabbing HA or migraine per neuro (Dr. Tomi Likens).  Gabapentin and topamax not tolerated.  These resolved spontaneously.   Past Surgical History:  Procedure Laterality Date   AORTIC VALVE REPLACEMENT  2011   [facility]  (bioprosthetic)   CARDIAC CATHETERIZATION  04/2010   Normal coronaries.   Carotid dopplers  08/2015   NORMAL   CATARACT EXTRACTION     L 12/06/09   R 09/14/09   COLONOSCOPY  01/26/13   tic's, o/w normal.  ([facility])--recall 10 yrs.   PENILE PROSTHESIS IMPLANT     PROSTATECTOMY  04/2002   for prostate cancer   Pulmonary nodule resection  Fall/winter 2016   [facility]   SHOULDER SURGERY     rotator cuff on both  arms    TIBIALIS TENDON TRANSFER / REPAIR Right 03/02/2019   Procedure: RECONSTRUCTION OF RIGHT  ANTERIOR TIBIALIS TENDON;  Surgeon: Erle Crocker, MD;  Location: [place];  Service: Orthopedics;  Laterality: Right;   TRANSTHORACIC ECHOCARDIOGRAM  09/20/2014; 03/2016; 03/2017   2015-mild LVH, EF 50-55%, wall motion nl, grade I diast dysfxn, prosth aort valve good,transaortic gradients decreased compared to echo 11/2013.   03/2016: EF 55-60%, normal LV function, severe LVH, normal diast fxn, mild increase in AV gradient compared to 09/2014 echo.  2018: EF 55-60%, normal LV fxn, grd II DD, AV stable/gradient's stable.     No outpatient medications have been marked as taking for the 05/01/21 encounter (Appointment) with Josue Hector, MD.     Allergies:   Hydrocodone-acetaminophen, Oxycodone hcl, Telmisartan-hctz, No known allergies, Ramipril, Aspirin, Atorvastatin, Buspirone hcl, Buspirone hcl, Codeine, Codeine phosphate, Morphine sulfate, Paroxetine, and Rabeprazole   Social History   Tobacco Use   Smoking status: Former    Packs/day: 1.00    Years: 12.00    Pack years: 12.00    Types: Cigarettes    Start date: 11/28/1968    Quit date: 01/29/1981    Years since quitting: 40.2   Smokeless tobacco: Never   Tobacco comments:    quit 75 yo   Vaping Use   Vaping Use: Never used  Substance Use Topics   Alcohol use: Yes    Alcohol/week: 0.0 standard drinks    Comment: occasional   Drug use: No     Family Hx: The patient's family history includes Cancer in his sister; Colon cancer (age of onset: 11) in his sister; Prostate cancer in his father; Stroke in his brother, mother, and sister. There is no history of Heart attack.  ROS:   Please see the history of present illness.     All other systems reviewed and are negative.   Prior CV studies:   The following studies were reviewed today:  Echo 04/08/18  EF 60-65% mild MR moderate LAE stable mean gradient through  AVR 18 mmHg peak 31 mmHg DVI 0.32   Labs/Other Tests and Data Reviewed:    EKG:  05/01/2021 SR rate 76 RBBB/LAFB    Recent Labs: 04/18/2021: ALT 14; BUN 30; Creatinine 1.67; Hemoglobin 12.1; Platelet Count 112; Potassium 4.2; Sodium 140   Recent Lipid Panel Lab Results  Component Value Date/Time   CHOL 181 05/12/2020 08:40 AM   TRIG 114.0 05/12/2020 08:40 AM   HDL 48.50 05/12/2020  08:40 AM   CHOLHDL 4 05/12/2020 08:40 AM   LDLCALC 110 (H) 05/12/2020 08:40 AM   LDLDIRECT 105.0 09/29/2018 01:42 PM    Wt Readings from Last 3 Encounters:  04/18/21 91.1 kg  02/22/21 92.5 kg  02/09/21 93.5 kg     Objective:    Vital Signs:  There were no vitals taken for this visit.   Affect appropriate Hoarse  Healthy:  appears stated age HEENT:bilateral supraclavicular adenopathy worse on left  JVP normal left bruits no thyromegaly Lungs clear with no wheezing and good diaphragmatic motion Heart:  S1/S2 AS  murmur, no rub, gallop or click PMI normal post sternotomy for AVR  Abdomen: benighn, BS positve, no tenderness, no AAA no bruit.  No HSM or HJR Varicose veins with trace edema bilaterally  Neuro non-focal Skin warm and dry No muscular weakness   ASSESSMENT & PLAN:    AVR:  2011 25 mm CE. Marked increase in gradients on echo 08/27/19 with cardiac CT showeing HALT/HAM despite being on xarelto. Changed to coumadin. Most recent echo 12/13/20 stable with slightly lower gradients mean 30 mmhg INR;s Have been Rx    History of DVT (deep vein thrombosis) and Recurrent pulmonary embolism Continue coumadin . This is managed by heme/onc.    Paroxysmal atrial fibrillation Maintaining NSR.  Continue  Coumadin     Essential hypertension Borderline control.  Continue to monitor.   HLD (hyperlipidemia) Has had leg pain with statins in past. Taking crestor 3 x/week    Lung nodule  Post VATS at [facility]  Recent CT see above f/u [facility]    Carotid Bruit:  Left duplex no stenosis f/u 08/2019     Depression:  Better off paroxetine now on low dose prozac with tofranil   Headache:  F/u primary PRN tylenol ok has schrap metal on right side of head Cant have MRI   MAY:OKHT with primary typically Cr around 1.6 on 02/16/18     BBB:  RBBB LAD post AVR ECG q 6 months follow for more advanced AV block    CLL:  F/u with Dr [name]  doing well on Calquence and Venetoclax BM biopsy August to see if  He can come off Rx WBC 2.9 with 37% lymphs down from 91% April 18 2021    Insomnia:  Improved off beta blocker and on zoloft   ENT:  Hoarseness ? Related to CLL f/u ENT    Medication Adjustments/Labs and Tests Ordered: Current medicines are reviewed at length with the patient today.  Concerns regarding medicines are outlined above.   Tests Ordered:  Echo AVR   Medication Changes:  None   Disposition:  F/u with me in 6 months when he has his echo   Signed, Jenkins Rouge, MD  04/25/2021 5:04 PM    [place]

## 2021-05-01 ENCOUNTER — Ambulatory Visit (INDEPENDENT_AMBULATORY_CARE_PROVIDER_SITE_OTHER): Payer: Medicare Other

## 2021-05-01 ENCOUNTER — Ambulatory Visit: Payer: Medicare Other | Admitting: Cardiovascular Disease

## 2021-05-01 ENCOUNTER — Other Ambulatory Visit: Payer: Self-pay

## 2021-05-01 VITALS — BP 122/78 | HR 76 | Ht 70.0 in | Wt 200.0 lb

## 2021-05-01 DIAGNOSIS — C9110 Chronic lymphocytic leukemia of B-cell type not having achieved remission: Secondary | ICD-10-CM

## 2021-05-01 DIAGNOSIS — Z952 Presence of prosthetic heart valve: Secondary | ICD-10-CM | POA: Diagnosis not present

## 2021-05-01 DIAGNOSIS — Z7901 Long term (current) use of anticoagulants: Secondary | ICD-10-CM

## 2021-05-01 DIAGNOSIS — I2699 Other pulmonary embolism without acute cor pulmonale: Secondary | ICD-10-CM | POA: Diagnosis not present

## 2021-05-01 DIAGNOSIS — Z5181 Encounter for therapeutic drug level monitoring: Secondary | ICD-10-CM

## 2021-05-01 DIAGNOSIS — I480 Paroxysmal atrial fibrillation: Secondary | ICD-10-CM | POA: Diagnosis not present

## 2021-05-01 LAB — POCT INR: INR: 2.6 (ref 2.0–3.0)

## 2021-05-01 NOTE — Patient Instructions (Addendum)
Medication Instructions:  *If you need a refill on your cardiac medications before your next appointment, please call your pharmacy*  Lab Work: If you have labs (blood work) drawn today and your tests are completely normal, you will receive your results only by: [place] (if you have MyChart) OR A paper copy in the mail If you have any lab test that is abnormal or we need to change your treatment, we will call you to review the results.  Testing/Procedures: Your physician has requested that you have an echocardiogram in 6 months. Echocardiography is a painless test that uses sound waves to create images of your heart. It provides your doctor with information about the size and shape of your heart and how well your heart's chambers and valves are working. This procedure takes approximately one hour. There are no restrictions for this procedure.  Follow-Up: At [facility], you and your health needs are our priority.  As part of our continuing mission to provide you with exceptional heart care, we have created designated Provider Care Teams.  These Care Teams include your primary Cardiologist (physician) and Advanced Practice Providers (APPs -  Physician Assistants and Nurse Practitioners) who all work together to provide you with the care you need, when you need it.  We recommend signing up for the patient portal called "MyChart".  Sign up information is provided on this After Visit Summary.  MyChart is used to connect with patients for Virtual Visits (Telemedicine).  Patients are able to view lab/test results, encounter notes, upcoming appointments, etc.  Non-urgent messages can be sent to your provider as well.   To learn more about what you can do with MyChart, go to [email or web address].    Your next appointment:   6 month(s)  The format for your next appointment:   In Person  Provider:   You may see Jenkins Rouge, MD or one of the following Advanced Practice Providers on your  designated Care Team:   Kathyrn Drown, NP

## 2021-05-01 NOTE — Patient Instructions (Signed)
Description   Continue taking Warfarin 1.5 tablets daily except 1 tablet on Mondays. Recheck in 3 weeks.  Please call with any questions or concerns please call [facility] [phone] Main [phone]

## 2021-05-01 NOTE — Addendum Note (Signed)
Addended by: Jacinta Shoe on: 05/01/2021 04:10 PM   Modules accepted: Orders

## 2021-05-15 ENCOUNTER — Encounter: Payer: Medicare Other | Admitting: Family Medicine

## 2021-05-17 ENCOUNTER — Ambulatory Visit: Payer: Medicare Other | Admitting: Psychology

## 2021-05-18 ENCOUNTER — Encounter: Payer: Self-pay | Admitting: Family Medicine

## 2021-05-18 ENCOUNTER — Ambulatory Visit (INDEPENDENT_AMBULATORY_CARE_PROVIDER_SITE_OTHER): Payer: Medicare Other | Admitting: Family Medicine

## 2021-05-18 ENCOUNTER — Other Ambulatory Visit: Payer: Self-pay

## 2021-05-18 VITALS — BP 105/71 | HR 75 | Temp 98.1°F | Ht 70.0 in | Wt 202.4 lb

## 2021-05-18 DIAGNOSIS — Z952 Presence of prosthetic heart valve: Secondary | ICD-10-CM | POA: Diagnosis not present

## 2021-05-18 DIAGNOSIS — C9110 Chronic lymphocytic leukemia of B-cell type not having achieved remission: Secondary | ICD-10-CM | POA: Diagnosis not present

## 2021-05-18 DIAGNOSIS — Z8546 Personal history of malignant neoplasm of prostate: Secondary | ICD-10-CM | POA: Diagnosis not present

## 2021-05-18 DIAGNOSIS — N183 Chronic kidney disease, stage 3 unspecified: Secondary | ICD-10-CM

## 2021-05-18 DIAGNOSIS — E785 Hyperlipidemia, unspecified: Secondary | ICD-10-CM | POA: Diagnosis not present

## 2021-05-18 DIAGNOSIS — Z79899 Other long term (current) drug therapy: Secondary | ICD-10-CM

## 2021-05-18 DIAGNOSIS — Z0000 Encounter for general adult medical examination without abnormal findings: Secondary | ICD-10-CM

## 2021-05-18 DIAGNOSIS — I2699 Other pulmonary embolism without acute cor pulmonale: Secondary | ICD-10-CM | POA: Diagnosis not present

## 2021-05-18 DIAGNOSIS — I480 Paroxysmal atrial fibrillation: Secondary | ICD-10-CM

## 2021-05-18 LAB — COMPREHENSIVE METABOLIC PANEL
ALT: 14 U/L (ref 0–53)
AST: 17 U/L (ref 0–37)
Albumin: 4.3 g/dL (ref 3.5–5.2)
Alkaline Phosphatase: 84 U/L (ref 39–117)
BUN: 24 mg/dL — ABNORMAL HIGH (ref 6–23)
CO2: 28 mEq/L (ref 19–32)
Calcium: 9.2 mg/dL (ref 8.4–10.5)
Chloride: 103 mEq/L (ref 96–112)
Creatinine, Ser: 1.52 mg/dL — ABNORMAL HIGH (ref 0.40–1.50)
GFR: 44.63 mL/min — ABNORMAL LOW (ref >60.00)
Glucose, Bld: 100 mg/dL — ABNORMAL HIGH (ref 70–99)
Potassium: 4.1 mEq/L (ref 3.5–5.1)
Sodium: 139 mEq/L (ref 135–145)
Total Bilirubin: 0.4 mg/dL (ref 0.2–1.2)
Total Protein: 6.4 g/dL (ref 6.0–8.3)

## 2021-05-18 LAB — CBC WITH DIFFERENTIAL/PLATELET
Basophils Absolute: 0 10*3/uL (ref 0.0–0.1)
Basophils Relative: 0.4 % (ref 0.0–3.0)
Eosinophils Absolute: 0 10*3/uL (ref 0.0–0.7)
Eosinophils Relative: 1 % (ref 0.0–5.0)
HCT: 35.1 % — ABNORMAL LOW (ref 39.0–52.0)
Hemoglobin: 12.1 g/dL — ABNORMAL LOW (ref 13.0–17.0)
Lymphocytes Relative: 41.4 % (ref 12.0–46.0)
Lymphs Abs: 1 10*3/uL (ref 0.7–4.0)
MCHC: 34.4 g/dL (ref 30.0–36.0)
MCV: 96.4 fl (ref 78.0–100.0)
Monocytes Absolute: 0.5 10*3/uL (ref 0.1–1.0)
Monocytes Relative: 20.9 % — ABNORMAL HIGH (ref 3.0–12.0)
Neutro Abs: 0.8 10*3/uL — ABNORMAL LOW (ref 1.4–7.7)
Neutrophils Relative %: 36.3 % — ABNORMAL LOW (ref 43.0–77.0)
Platelets: 160 10*3/uL (ref 150.0–400.0)
RBC: 3.64 Mil/uL — ABNORMAL LOW (ref 4.22–5.81)
RDW: 13.5 % (ref 11.5–15.5)
WBC: 2.3 10*3/uL — ABNORMAL LOW (ref 4.0–10.5)

## 2021-05-18 LAB — LIPID PANEL
Cholesterol: 170 mg/dL (ref 0–200)
HDL: 35.8 mg/dL — ABNORMAL LOW (ref >39.00)
NonHDL: 134.42
Total CHOL/HDL Ratio: 5
Triglycerides: 238 mg/dL — ABNORMAL HIGH (ref 0.0–149.0)
VLDL: 47.6 mg/dL — ABNORMAL HIGH (ref 0.0–40.0)

## 2021-05-18 LAB — LDL CHOLESTEROL, DIRECT: Direct LDL: 95 mg/dL

## 2021-05-18 LAB — PSA: PSA: 0 ng/mL — ABNORMAL LOW (ref 0.10–4.00)

## 2021-05-18 LAB — VITAMIN B12: Vitamin B-12: 484 pg/mL (ref 211–911)

## 2021-05-18 NOTE — Progress Notes (Signed)
Phone: [phone]   Subjective:  Patient presents today for their annual physical. Chief complaint-noted.   See problem oriented charting- ROS- full  review of systems was completed and negative  except for: nosebleeds on coumadin- went to urgent care and ended up in ED back in May, sinus pressure, dizziness, anxiety - also when he was in [place] nad was at higher elevation- chest tightness, shortness of breath- trouble with an incline there but others were having issues.  -later when got home had some wheezing once getting home and noted some shortness of breath as well. Stabilizing posture helped him with this- used tai chi move- wondered if it was anxiety related. Dr. [name] did not think it was heart related on 05/01/21 per patient - chest pain/sob/wheezing much improved in last few days- states very minimal- exercising without difficulty -he did dive out of bed recently around time of fireworks but that was after chest pain/sob/wheezing -we will monitor for now but if new or worsening symptoms he should let us know- glad has seen cardiology and oncology   The following were reviewed and entered/updated in epic: Past Medical History:  Diagnosis Date   Anticoagulants causing adverse effect in therapeutic use    Anxiety    Paxil seemed to cause odd neuro side effects; better on prozac as of 09/2015   Aortic regurgitation 04/2007 surgery   Resolved with tissue AVR at [facility]   Cervical spondylosis    ESI by [name] in [place]. 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 (most recent 07/2017)   History of prostate cancer 2003   Rad prost   History of pulmonary embolism 2011; 10/2014   Recurrence off of anticoagulation 10/2014: per hem/onc ([name]) pt needs lifelong anticoagulation (hypercoag w/u neg).   Hyperlipidemia    statin-intolerant, except for crestor low dose.   Hypertension    Migraine  syndrome    Mitral regurgitation    PAF (paroxysmal atrial fibrillation) (HCC)    Panic attacks    "           "             "                  "                  "                       "                             "                          Pulmonary nodule, right 2015   RLL: resected at [facility] --VATS; Benign RLL nodule (fungal and AFB stains neg).  Plan is for [facility] thoracic surg to do f/u o/v & CT chest 1 yr    Right-sided headache 07/2016   Primary stabbing HA or migraine per neuro (Dr. Tomi Likens).  Gabapentin and topamax not tolerated.  These resolved spontaneously.   Patient Active Problem List   Diagnosis Date Noted   S/P Bioprosthetic AVR (aortic valve replacement) in 2011 03/22/2015    Priority: High   Recurrent pulmonary embolism ([place]) 11/09/2014    Priority: High  Chronic lymphocytic leukemia ([place]) 11/11/2013    Priority: High   Atrial fibrillation ([place]) 04/20/2007    Priority: High   PROSTATE CANCER, HX OF 04/20/2007    Priority: High   PTSD (post-traumatic stress disorder) 09/29/2018    Priority: Medium   BPPV (benign paroxysmal positional vertigo) 09/04/2017    Priority: Medium   CKD (chronic kidney disease), stage III ([place]) 08/11/2017    Priority: Medium   Cervical spondylosis     Priority: Medium   Migraine 10/08/2010    Priority: Medium   Insomnia 02/03/2008    Priority: Medium   Anxiety state 09/06/2007    Priority: Medium   HLD (hyperlipidemia) 04/20/2007    Priority: Medium   Essential hypertension 04/20/2007    Priority: Medium   Former smoker 08/29/2016    Priority: Low   Lung nodule 11/02/2014    Priority: Low   GERD 12/11/2009    Priority: Low   MITRAL REGURGITATION 04/07/2009    Priority: Low   Encounter for therapeutic drug monitoring 09/21/2019   Past Surgical History:  Procedure Laterality Date   AORTIC VALVE REPLACEMENT  2011   [facility]  (bioprosthetic)   CARDIAC CATHETERIZATION  04/2010   Normal coronaries.   Carotid dopplers  08/2015    NORMAL   CATARACT EXTRACTION     L 12/06/09   R 09/14/09   COLONOSCOPY  01/26/13   tic's, o/w normal.  ([facility])--recall 10 yrs.   PENILE PROSTHESIS IMPLANT     PROSTATECTOMY  04/2002   for prostate cancer   Pulmonary nodule resection  Fall/winter 2016   [facility]   SHOULDER SURGERY     rotator cuff on both arms    TIBIALIS TENDON TRANSFER / REPAIR Right 03/02/2019   Procedure: RECONSTRUCTION OF RIGHT  ANTERIOR TIBIALIS TENDON;  Surgeon: Erle Crocker, MD;  Location: [place];  Service: Orthopedics;  Laterality: Right;   TRANSTHORACIC ECHOCARDIOGRAM  09/20/2014; 03/2016; 03/2017   2015-mild LVH, EF 50-55%, wall motion nl, grade I diast dysfxn, prosth aort valve good,transaortic gradients decreased compared to echo 11/2013.   03/2016: EF 55-60%, normal LV function, severe LVH, normal diast fxn, mild increase in AV gradient compared to 09/2014 echo.  2018: EF 55-60%, normal LV fxn, grd II DD, AV stable/gradient's stable.    Family History  Problem Relation Age of Onset   Cancer Sister        uterine   Colon cancer Sister 34   Stroke Mother    Prostate cancer Father    Stroke Sister    Stroke Brother    Heart attack Neg Hx     Medications- reviewed and updated Current Outpatient Medications  Medication Sig Dispense Refill   acalabrutinib (CALQUENCE) 100 MG capsule Take 1 capsule (100 mg total) by mouth 2 (two) times daily. 60 capsule 6   allopurinol (ZYLOPRIM) 100 MG tablet TAKE 1 TABLET BY MOUTH EVERY DAY 90 tablet 0   amoxicillin (AMOXIL) 500 MG capsule Take 500 mg by mouth 2 (two) times daily. Prior to dental procedures.     cholecalciferol (VITAMIN D) 1000 units tablet Take 1,000 Units by mouth daily.     hydrochlorothiazide (HYDRODIURIL) 12.5 MG tablet TAKE 1 TABLET BY MOUTH EVERY DAY 90 tablet 1   ipratropium (ATROVENT) 0.03 % nasal spray Place 2 sprays into both nostrils every 12 (twelve) hours. 30 mL 12   LORazepam (ATIVAN) 0.5 MG tablet TAKE 1 TABLET BY MOUTH  EVERY DAY AS NEEDED FOR ANXIETY 30 tablet  2   Meclizine HCl 25 MG CHEW CHEW 1 TABLET EVERY DAY AS NEEDED FOR DIZZINESS 30 tablet 2   omeprazole (PRILOSEC) 40 MG capsule Take 40 mg by mouth daily.     rosuvastatin (CRESTOR) 10 MG tablet TAKE 1 TABLET BY MOUTH EVERY DAY 90 tablet 3   senna (SENOKOT) 8.6 MG tablet Take 2 tablets by mouth daily as needed.     sertraline (ZOLOFT) 100 MG tablet Take 100 mg by mouth daily.     venetoclax (VENCLEXTA) 100 MG tablet Take 2 tablets (200 mg total) by mouth daily. 60 tablet 3   warfarin (COUMADIN) 5 MG tablet TAKE AS DIRECTED BY COUMADIN CLINIC 160 tablet 1   No current facility-administered medications for this visit.    Allergies-reviewed and updated Allergies  Allergen Reactions   Hydrocodone-Acetaminophen Shortness Of Breath   Oxycodone Hcl Shortness Of Breath   Telmisartan-Hctz Shortness Of Breath and Palpitations    Dizziness, too Dizziness, too   No Known Allergies Nausea And Vomiting   Ramipril    Aspirin Other (See Comments) and Rash    Other Reaction: confusion Can't take high dose   Atorvastatin Other (See Comments)    myalgia   Buspirone Hcl Other (See Comments)    headache   Buspirone Hcl Other (See Comments)    headache   Codeine Nausea Only, Other (See Comments), Hives and Nausea And Vomiting    Other Reaction: confusion   Codeine Phosphate Itching and Nausea Only   Morphine Sulfate Itching   Paroxetine Other (See Comments)    Paresthesias: R arm, R leg, r side of face   Rabeprazole Other (See Comments)    headache    Social History   Social History Narrative   Single. No children. Lives alone      Retired from [employer].       Hobbies: travel, Scientist, product/process development, enjoys being outdoors including hiking   Objective  Objective:  BP 105/71   Pulse 75   Temp 98.1 F (36.7 C) (Temporal)   Ht _0  (1.778 m)   Wt 202 lb 6.4 oz (91.8 kg)   SpO2 99%   BMI 29.04 kg/m  Gen: NAD, resting  comfortably HEENT: Mucous membranes are moist. Oropharynx normal Neck: no thyromegaly CV: RRR no murmurs rubs or gallops Lungs: CTAB no crackles, wheeze, rhonchi Abdomen: soft/nontender/nondistended/normal bowel sounds. No rebound or guarding.  Ext: no edema Skin: warm, dry Neuro: grossly normal, moves all extremities, PERRLA   Assessment and Plan  75 y.o. male presenting for annual physical.  Health Maintenance counseling: 1. Anticipatory guidance: Patient counseled regarding regular dental exams -q6 months, eye exams - advised yearly,  avoiding smoking and second hand smoke , limiting alcohol to 2 beverages per day - no alcohol since started chemo pills around a year ago.   2. Risk factor reduction:  Advised patient of need for regular exercise and diet rich and fruits and vegetables to reduce risk of heart attack and stroke. Exercise-walks, swims, tai chi. Diet-.  Was able to regain weight after prior loss on CLL treatment and due to kitchen being redone- would maintain weight Wt Readings from Last 3 Encounters:  05/18/21 202 lb 6.4 oz (91.8 kg)  05/01/21 200 lb (90.7 kg)  04/18/21 200 lb 12.8 oz (91.1 kg)  3. Immunizations/screenings/ancillary studies-fully up-to-date  Immunization History  Administered Date(s) Administered   Fluad Quad(high Dose 65+) 09/24/2020   Influenza Split 08/16/2011, 08/14/2012, 07/13/2015, 09/11/2016   Influenza Whole  08/17/2010   Influenza, High Dose Seasonal PF 08/23/2013, 08/29/2016, 08/11/2017, 08/11/2018, 06/24/2019   Influenza,inj,Quad PF,6+ Mos 09/05/2014, 08/02/2015   Influenza-Unspecified 06/29/2018, 08/11/2020   PFIZER Comirnaty(Gray Top)Covid-19 Tri-Sucrose Vaccine 02/26/2021   PFIZER(Purple Top)SARS-COV-2 Vaccination 12/12/2019, 01/02/2020, 08/03/2020   Pneumococcal Conjugate-13 01/23/2015, 08/12/2015   Pneumococcal Polysaccharide-23 03/15/2011, 12/20/2020   Td 04/16/2007   Tdap 04/14/2018, 12/20/2020   Zoster Recombinat (Shingrix)  02/18/2021   Zoster, Live 08/16/2011  4. Prostate cancer screening-  prostatectomy 2008-continue to trend PSA due to history prostate cancer Lab Results  Component Value Date   PSA 0.00 (L) 05/12/2020   PSA 0.00 (L) 09/29/2018   PSA 0.01 (L) 11/16/2014   5. Colon cancer screening - 2014 with 10-year repeat planned 6. Skin cancer screening-lower risk due to melanin content.  Advised regular sunscreen use. Denies worrisome, changing, or new skin lesions- other than possible small lipoma on left arm  7.  Former smoker-quit in 1980s.  AAA screen by CT abdomen pelvis 2016 which was negative 8. STD screening - declines-not active at present  Status of chronic or acute concerns   #social update- [place]- he enjoyed this. Considering [place] for seeing friend or [place] for training for tai chi -enjoying pup dylan  - see notes in ROS about SOB/chest tightness/wheezing  #CLL-patient on venetoclax in addition to Calquence-regular follow-up with oncology  -upcoming bone marrow biopsy and aspirate coming up  # Atrial fibrillation-follows with cardiology #Recurrent pulmonary embolism-on chronic Coumadin #Status post bioprosthetic AVR S: Rate controlled without meds Anticoagulated with Coumadin-changed to this by cardiology due to thrombus on AVR A/P: a fib appropriately anticoagulated- this is also treating thrombus on the AVR and chronic PE- continue current meds. Rate controlled without meds   #hypertension S: medication: Hydrochlorothiazide 12.5 mg Home readings #s: does not check- anxiety provoking BP Readings from Last 3 Encounters:  05/18/21 105/71  05/01/21 122/78  04/18/21 130/86  A/P: Stable- feels fine with lower BP today. Continue current medications.   #Chronic kidney disease stage III S: GFR is typically in the 40srange -Patient knows to avoid NSAIDs  A/P: hopefully stable- update CMP today.   #hyperlipidemia S: Medication: Crestor 10 mg  daily  A/P: Update lipid panel today-hopefully LDL at least near 100-do not feel strongly about being aggressive on dose considering peak LDL was over 200-has had impressive response between 30 and 50% improvement with no known CAD   # Anxiety/PTSD- has seen [place] psychiatry in past but not anymore S:Medication: Sertraline 100 mg, Ativan 0.5 mg-able to cut in half and use-once or twice a week-knows to avoid driving for 8 hours after use  Counseling: Continues to work with therapist Dr. Cheryln Manly A/P: with recent events of CP/SOB and possible relation to anxiety- would prefer to hold steady on the sertraline dose especially with having to still take ativan.   # GERD S:Medication: Prilosec 40 mg from ENT -Has seen ENT in the past who recommended cutting back on coffee and spicy foods A/P: Stable. Continue current medications. ENT managing so will not make adjustment- ideally reduce if possible with CKD history   #BPPV- still present at times- does feel some tightness in face when this occurse. No falls related to this. Meclizine if happens. Occurs in spells up to weeks   Recommended follow up: Return in about 6 months (around 11/18/2021) for follow up- or sooner if needed. Future Appointments  Date Time Provider [place]  05/21/2021 10:30 AM Oren Binet, PhD  [facility] None  05/23/2021  1:00 PM [facility]  06/14/2021  1:00 PM Oren Binet, PhD [facility] None  06/22/2021  9:30 AM [facility] ROOM [facility] None  06/22/2021 11:00 AM [facility] 1 [facility] [place]  07/05/2021  2:30 PM [facility] LAB [facility] None  07/05/2021  3:00 PM Ennever, Rudell Cobb, MD [facility] None  10/15/2021  8:20 AM [facility] ECHO 2 [facility]  10/15/2021  9:30 AM Josue Hector, MD [facility]  01/21/2022  1:00 PM [facility] HEALTH COACH [facility]   Lab/Order associations:NOT fasting   ICD-10-CM   1. Preventative health care  Z00.00     2. Chronic  lymphocytic leukemia ([place])  C91.10     3. Recurrent pulmonary embolism (HCC)  I26.99     4. S/P Bioprosthetic AVR (aortic valve replacement) in 2011  Z95.2     5. Paroxysmal atrial fibrillation (HCC)  I48.0     6. PROSTATE CANCER, HX OF  Z85.46 PSA    7. Stage 3 chronic kidney disease, unspecified whether stage 3a or 3b CKD (HCC)  N18.30     8. Hyperlipidemia, unspecified hyperlipidemia type  E78.5 CBC with Differential/Platelet    Comprehensive metabolic panel    Lipid panel    9. High risk medication use  Z79.899 B12      Return precautions advised.  Garret Reddish, MD

## 2021-05-18 NOTE — Patient Instructions (Addendum)
Please stop by lab before you go If you have mychart- we will send your results within 3 business days of [name] receiving them.  If you do not have mychart- we will call you about results within 5 business days of [name] receiving them.  *please also note that you will see labs on mychart as soon as they post. I will later go in and write notes on them- will say "notes from Dr. Yong Channel"  Wish you the best with the upcoming bone marrow biopsy- hoping for good results  Recommended follow up: Return in about 6 months (around 11/18/2021) for follow up- or sooner if needed.

## 2021-05-21 ENCOUNTER — Ambulatory Visit (INDEPENDENT_AMBULATORY_CARE_PROVIDER_SITE_OTHER): Payer: Medicare Other | Admitting: Psychology

## 2021-05-21 ENCOUNTER — Encounter: Payer: Self-pay | Admitting: Family Medicine

## 2021-05-21 DIAGNOSIS — F4321 Adjustment disorder with depressed mood: Secondary | ICD-10-CM

## 2021-05-22 ENCOUNTER — Encounter: Payer: Self-pay | Admitting: Hematology & Oncology

## 2021-05-23 ENCOUNTER — Other Ambulatory Visit: Payer: Self-pay

## 2021-05-23 ENCOUNTER — Ambulatory Visit (INDEPENDENT_AMBULATORY_CARE_PROVIDER_SITE_OTHER): Payer: Medicare Other | Admitting: *Deleted

## 2021-05-23 DIAGNOSIS — Z5181 Encounter for therapeutic drug level monitoring: Secondary | ICD-10-CM

## 2021-05-23 DIAGNOSIS — I480 Paroxysmal atrial fibrillation: Secondary | ICD-10-CM

## 2021-05-23 DIAGNOSIS — Z952 Presence of prosthetic heart valve: Secondary | ICD-10-CM | POA: Diagnosis not present

## 2021-05-23 DIAGNOSIS — I2699 Other pulmonary embolism without acute cor pulmonale: Secondary | ICD-10-CM | POA: Diagnosis not present

## 2021-05-23 LAB — POCT INR: INR: 2.6 (ref 2.0–3.0)

## 2021-05-23 NOTE — Patient Instructions (Signed)
Continue taking Warfarin 1.5 tablets daily except 1 tablet on Mondays. Recheck in 2 weeks. Please call with any questions or concerns please call Coumadin Clinic [phone] Main [phone]

## 2021-05-26 ENCOUNTER — Encounter: Payer: Self-pay | Admitting: Hematology & Oncology

## 2021-05-29 ENCOUNTER — Emergency Department (HOSPITAL_COMMUNITY)
Admission: EM | Admit: 2021-05-29 | Discharge: 2021-05-29 | Disposition: A | Discharge status: Home / Self Care | Payer: No Typology Code available for payment source | Attending: Emergency Medicine | Admitting: Emergency Medicine

## 2021-05-29 ENCOUNTER — Emergency Department (HOSPITAL_COMMUNITY): Payer: No Typology Code available for payment source

## 2021-05-29 ENCOUNTER — Telehealth: Payer: Self-pay

## 2021-05-29 DIAGNOSIS — N183 Chronic kidney disease, stage 3 unspecified: Secondary | ICD-10-CM | POA: Diagnosis not present

## 2021-05-29 DIAGNOSIS — D709 Neutropenia, unspecified: Secondary | ICD-10-CM | POA: Diagnosis not present

## 2021-05-29 DIAGNOSIS — I129 Hypertensive chronic kidney disease with stage 1 through stage 4 chronic kidney disease, or unspecified chronic kidney disease: Secondary | ICD-10-CM | POA: Diagnosis not present

## 2021-05-29 DIAGNOSIS — Z87891 Personal history of nicotine dependence: Secondary | ICD-10-CM | POA: Insufficient documentation

## 2021-05-29 DIAGNOSIS — R509 Fever, unspecified: Secondary | ICD-10-CM | POA: Diagnosis present

## 2021-05-29 DIAGNOSIS — I4891 Unspecified atrial fibrillation: Secondary | ICD-10-CM | POA: Insufficient documentation

## 2021-05-29 DIAGNOSIS — R011 Cardiac murmur, unspecified: Secondary | ICD-10-CM | POA: Insufficient documentation

## 2021-05-29 DIAGNOSIS — Z8546 Personal history of malignant neoplasm of prostate: Secondary | ICD-10-CM | POA: Insufficient documentation

## 2021-05-29 DIAGNOSIS — Z7901 Long term (current) use of anticoagulants: Secondary | ICD-10-CM | POA: Diagnosis not present

## 2021-05-29 DIAGNOSIS — Z20822 Contact with and (suspected) exposure to covid-19: Secondary | ICD-10-CM | POA: Insufficient documentation

## 2021-05-29 DIAGNOSIS — Z79899 Other long term (current) drug therapy: Secondary | ICD-10-CM | POA: Diagnosis not present

## 2021-05-29 LAB — URINALYSIS, ROUTINE W REFLEX MICROSCOPIC
Bacteria, UA: NONE SEEN
Bilirubin Urine: NEGATIVE
Glucose, UA: NEGATIVE mg/dL
Ketones, ur: NEGATIVE mg/dL
Leukocytes,Ua: NEGATIVE
Nitrite: NEGATIVE
Protein, ur: NEGATIVE mg/dL
Specific Gravity, Urine: 1.013 (ref 1.005–1.030)
pH: 6 (ref 5.0–8.0)

## 2021-05-29 LAB — CBC
HCT: 34.5 % — ABNORMAL LOW (ref 39.0–52.0)
Hemoglobin: 11.4 g/dL — ABNORMAL LOW (ref 13.0–17.0)
MCH: 32.7 pg (ref 26.0–34.0)
MCHC: 33 g/dL (ref 30.0–36.0)
MCV: 98.9 fL (ref 80.0–100.0)
Platelets: 78 10*3/uL — ABNORMAL LOW (ref 150–400)
RBC: 3.49 MIL/uL — ABNORMAL LOW (ref 4.22–5.81)
RDW: 13.1 % (ref 11.5–15.5)
WBC: 1.2 10*3/uL — CL (ref 4.0–10.5)
nRBC: 0 % (ref 0.0–0.2)

## 2021-05-29 LAB — CBC WITH DIFFERENTIAL/PLATELET
Abs Immature Granulocytes: 0.02 10*3/uL (ref 0.00–0.07)
Basophils Absolute: 0 10*3/uL (ref 0.0–0.1)
Basophils Relative: 0 %
Eosinophils Absolute: 0 10*3/uL (ref 0.0–0.5)
Eosinophils Relative: 0 %
HCT: 34 % — ABNORMAL LOW (ref 39.0–52.0)
Hemoglobin: 11.6 g/dL — ABNORMAL LOW (ref 13.0–17.0)
Immature Granulocytes: 2 %
Lymphocytes Relative: 33 %
Lymphs Abs: 0.4 10*3/uL — ABNORMAL LOW (ref 0.7–4.0)
MCH: 33.4 pg (ref 26.0–34.0)
MCHC: 34.1 g/dL (ref 30.0–36.0)
MCV: 98 fL (ref 80.0–100.0)
Monocytes Absolute: 0.3 10*3/uL (ref 0.1–1.0)
Monocytes Relative: 24 %
Neutro Abs: 0.5 10*3/uL — ABNORMAL LOW (ref 1.7–7.7)
Neutrophils Relative %: 41 %
Platelets: 81 10*3/uL — ABNORMAL LOW (ref 150–400)
RBC: 3.47 MIL/uL — ABNORMAL LOW (ref 4.22–5.81)
RDW: 13.1 % (ref 11.5–15.5)
WBC: 1.1 10*3/uL — CL (ref 4.0–10.5)
nRBC: 0 % (ref 0.0–0.2)

## 2021-05-29 LAB — TROPONIN I (HIGH SENSITIVITY)
Troponin I (High Sensitivity): 23 ng/L — ABNORMAL HIGH (ref <18)
Troponin I (High Sensitivity): 21 ng/L — ABNORMAL HIGH (ref <18)

## 2021-05-29 LAB — LACTIC ACID, PLASMA
Lactic Acid, Venous: 0.9 mmol/L (ref 0.5–1.9)
Lactic Acid, Venous: 0.6 mmol/L (ref 0.5–1.9)

## 2021-05-29 LAB — BASIC METABOLIC PANEL
Anion gap: 10 (ref 5–15)
BUN: 30 mg/dL — ABNORMAL HIGH (ref 8–23)
CO2: 25 mmol/L (ref 22–32)
Calcium: 8.6 mg/dL — ABNORMAL LOW (ref 8.9–10.3)
Chloride: 99 mmol/L (ref 98–111)
Creatinine, Ser: 2.05 mg/dL — ABNORMAL HIGH (ref 0.61–1.24)
GFR, Estimated: 33 mL/min — ABNORMAL LOW (ref >60)
Glucose, Bld: 112 mg/dL — ABNORMAL HIGH (ref 70–99)
Potassium: 3.7 mmol/L (ref 3.5–5.1)
Sodium: 134 mmol/L — ABNORMAL LOW (ref 135–145)

## 2021-05-29 LAB — RESP PANEL BY RT-PCR (FLU A&B, COVID) ARPGX2
Influenza A by PCR: NEGATIVE
Influenza B by PCR: NEGATIVE
SARS Coronavirus 2 by RT PCR: NEGATIVE

## 2021-05-29 LAB — PROTIME-INR
INR: 2.2 — ABNORMAL HIGH (ref 0.8–1.2)
Prothrombin Time: 24.4 seconds — ABNORMAL HIGH (ref 11.4–15.2)

## 2021-05-29 MED ORDER — CEFDINIR 300 MG PO CAPS: 300.0000 mg | ORAL_CAPSULE | Freq: Two times a day (BID) | ORAL | 0 refills | Status: AC

## 2021-05-29 MED ORDER — SODIUM CHLORIDE 0.9 % IV BOLUS
1000.0000 mL | Freq: Once | INTRAVENOUS | Status: AC
  Administered 2021-05-29: 1000 mL via INTRAVENOUS

## 2021-05-29 MED ORDER — CIPROFLOXACIN HCL 500 MG PO TABS
500.0000 mg | ORAL_TABLET | Freq: Once | ORAL | Status: AC
  Administered 2021-05-29: 500 mg via ORAL
  Filled 2021-05-29: qty 1

## 2021-05-29 NOTE — ED Notes (Signed)
Informed RN of heart monitor readings 

## 2021-05-29 NOTE — ED Provider Notes (Signed)
[facility] EMERGENCY DEPARTMENT Provider Note   CSN: [id number] Arrival date & time: 05/29/21  1010     History Chief Complaint  Patient presents with   Fever   Weakness    [name] is a 75 y.o. male.  HPI 75 year old male with a history of CLL currently being treated with venetoclax presents with fever.  Last night he had a temperature of 103 or 102.  No fever today.  He has a mild dull headache today.  Previous to this for the past month or so he has been getting lightheaded and short of breath for about 25 seconds when he stands up.  Feels off balance and then this resolves and he is able to go about his day.  Otherwise no cough or shortness of breath currently.  No chest pain, abdominal pain, vomiting, diarrhea.  Past Medical History:  Diagnosis Date   Anticoagulants causing adverse effect in therapeutic use    Anxiety    Paxil seemed to cause odd neuro side effects; better on prozac as of 09/2015   Aortic regurgitation 04/2007 surgery   Resolved with tissue AVR at [facility]   Cervical spondylosis    ESI by [name] in [place]. 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 (most recent 07/2017)   History of prostate cancer 2003   Rad prost   History of pulmonary embolism 2011; 10/2014   Recurrence off of anticoagulation 10/2014: per hem/onc ([name]) pt needs lifelong anticoagulation (hypercoag w/u neg).   Hyperlipidemia    statin-intolerant, except for crestor low dose.   Hypertension    Migraine syndrome    Mitral regurgitation    PAF (paroxysmal atrial fibrillation) (HCC)    Panic attacks    "           "             "                  "                  "                       "                             "                          Pulmonary nodule, right 2015   RLL: resected at [facility] --VATS; Benign RLL nodule (fungal and AFB stains neg).  Plan is for [facility]  thoracic surg to do f/u o/v & CT chest 1 yr    Right-sided headache 07/2016   Primary stabbing HA or migraine per neuro (Dr. Tomi Likens).  Gabapentin and topamax not tolerated.  These resolved spontaneously.    Patient Active Problem List   Diagnosis Date Noted   Encounter for therapeutic drug monitoring 09/21/2019   PTSD (post-traumatic stress disorder) 09/29/2018   BPPV (benign paroxysmal positional vertigo) 09/04/2017   CKD (chronic kidney disease), stage III ([place]) 08/11/2017   Former smoker 08/29/2016   Cervical spondylosis    S/P Bioprosthetic AVR (aortic valve replacement) in 2011 03/22/2015   Recurrent pulmonary embolism ([place]) 11/09/2014   Lung nodule 11/02/2014   Chronic lymphocytic leukemia (  [place]) 11/11/2013   Migraine 10/08/2010   GERD 12/11/2009   MITRAL REGURGITATION 04/07/2009   Insomnia 02/03/2008   Anxiety state 09/06/2007   HLD (hyperlipidemia) 04/20/2007   Essential hypertension 04/20/2007   Atrial fibrillation ([place]) 04/20/2007   PROSTATE CANCER, HX OF 04/20/2007    Past Surgical History:  Procedure Laterality Date   AORTIC VALVE REPLACEMENT  2011   [place]  (bioprosthetic)   CARDIAC CATHETERIZATION  04/2010   Normal coronaries.   Carotid dopplers  08/2015   NORMAL   CATARACT EXTRACTION     L 12/06/09   R 09/14/09   COLONOSCOPY  01/26/13   tic's, o/w normal.  ([place])--recall 10 yrs.   PENILE PROSTHESIS IMPLANT     PROSTATECTOMY  04/2002   for prostate cancer   Pulmonary nodule resection  Fall/winter 2016   [facility]   SHOULDER SURGERY     rotator cuff on both arms    TIBIALIS TENDON TRANSFER / REPAIR Right 03/02/2019   Procedure: RECONSTRUCTION OF RIGHT  ANTERIOR TIBIALIS TENDON;  Surgeon: Erle Crocker, MD;  Location: [facility];  Service: Orthopedics;  Laterality: Right;   TRANSTHORACIC ECHOCARDIOGRAM  09/20/2014; 03/2016; 03/2017   2015-mild LVH, EF 50-55%, wall motion nl, grade I diast dysfxn, prosth aort valve good,transaortic gradients  decreased compared to echo 11/2013.   03/2016: EF 55-60%, normal LV function, severe LVH, normal diast fxn, mild increase in AV gradient compared to 09/2014 echo.  2018: EF 55-60%, normal LV fxn, grd II DD, AV stable/gradient's stable.       Family History  Problem Relation Age of Onset   Cancer Sister        uterine   Colon cancer Sister 31   Stroke Mother    Prostate cancer Father    Stroke Sister    Stroke Brother    Heart attack Neg Hx     Social History   Tobacco Use   Smoking status: Former    Packs/day: 1.00    Years: 12.00    Pack years: 12.00    Types: Cigarettes    Start date: 11/28/1968    Quit date: 01/29/1981    Years since quitting: 40.3   Smokeless tobacco: Never   Tobacco comments:    quit 75 yo   Vaping Use   Vaping Use: Never used  Substance Use Topics   Alcohol use: Yes    Alcohol/week: 0.0 standard drinks    Comment: occasional   Drug use: No    Home Medications Prior to Admission medications   Medication Sig Start Date End Date Taking? Authorizing Provider  cefdinir (OMNICEF) 300 MG capsule Take 1 capsule (300 mg total) by mouth 2 (two) times daily for 10 days. 05/29/21 06/08/21 Yes Sherwood Gambler, MD  acalabrutinib (CALQUENCE) 100 MG capsule Take 1 capsule (100 mg total) by mouth 2 (two) times daily. 11/27/20   Volanda Napoleon, MD  allopurinol (ZYLOPRIM) 100 MG tablet TAKE 1 TABLET BY MOUTH EVERY DAY 03/12/21   Volanda Napoleon, MD  amoxicillin (AMOXIL) 500 MG capsule Take 500 mg by mouth 2 (two) times daily. Prior to dental procedures.    [provider]  cholecalciferol (VITAMIN D) 1000 units tablet Take 1,000 Units by mouth daily.    [provider]  hydrochlorothiazide (HYDRODIURIL) 12.5 MG tablet TAKE 1 TABLET BY MOUTH EVERY DAY 04/04/21   [name], MD  ipratropium (ATROVENT) 0.03 % nasal spray Place 2 sprays into both nostrils every 12 (twelve) hours.  10/04/20   Inda Coke, PA  LORazepam (ATIVAN) 0.5 MG tablet TAKE  1 TABLET BY MOUTH EVERY DAY AS NEEDED FOR ANXIETY 02/26/21   [name], MD  Meclizine HCl 25 MG CHEW CHEW 1 TABLET EVERY DAY AS NEEDED FOR DIZZINESS 02/27/21   [name], MD  omeprazole (PRILOSEC) 40 MG capsule Take 40 mg by mouth daily. 10/18/20   [provider]  rosuvastatin (CRESTOR) 10 MG tablet TAKE 1 TABLET BY MOUTH EVERY DAY 11/01/20   [name], MD  senna (SENOKOT) 8.6 MG tablet Take 2 tablets by mouth daily as needed. 03/07/16   [provider]  sertraline (ZOLOFT) 100 MG tablet Take 100 mg by mouth daily.    [provider]  venetoclax (VENCLEXTA) 100 MG tablet Take 2 tablets (200 mg total) by mouth daily. 03/28/21   Volanda Napoleon, MD  warfarin (COUMADIN) 5 MG tablet TAKE AS DIRECTED BY COUMADIN CLINIC 03/19/21   Josue Hector, MD    Allergies    Hydrocodone-acetaminophen, Oxycodone hcl, Telmisartan-hctz, No known allergies, Ramipril, Aspirin, Atorvastatin, Buspirone hcl, Buspirone hcl, Codeine, Codeine phosphate, Morphine sulfate, Paroxetine, and Rabeprazole  Review of Systems   Review of Systems  Constitutional:  Positive for fever.  Respiratory:  Positive for shortness of breath.   Cardiovascular:  Negative for chest pain.  Gastrointestinal:  Negative for abdominal pain and vomiting.  Neurological:  Positive for dizziness, light-headedness and headaches.  All other systems reviewed and are negative.  Physical Exam Updated Vital Signs BP 107/82   Pulse 75   Temp 98.2 F (36.8 C) (Oral)   Resp 19   Ht 5\' 10"  (1.778 m)   Wt 93.4 kg   SpO2 100%   BMI 29.56 kg/m   Physical Exam Vitals and nursing note reviewed.  Constitutional:      Appearance: He is well-developed.  HENT:     Head: Normocephalic and atraumatic.     Right Ear: External ear normal.     Left Ear: External ear normal.     Nose: Nose normal.  Eyes:     General:        Right eye: No discharge.        Left eye: No discharge.     Extraocular Movements:  Extraocular movements intact.  Cardiovascular:     Rate and Rhythm: Normal rate and regular rhythm.     Heart sounds: Murmur heard.  Pulmonary:     Effort: Pulmonary effort is normal.     Breath sounds: Normal breath sounds.  Abdominal:     Palpations: Abdomen is soft.     Tenderness: There is no abdominal tenderness.  Musculoskeletal:     Cervical back: Neck supple.  Skin:    General: Skin is warm and dry.  Neurological:     Mental Status: He is alert.     Comments: CN 3-12 grossly intact. 5/5 strength in all 4 extremities. Grossly normal sensation. Normal finger to nose.   Psychiatric:        Mood and Affect: Mood is not anxious.    ED Results / Procedures / Treatments   Labs (all labs ordered are listed, but only abnormal results are displayed) Labs Reviewed  BASIC METABOLIC PANEL - Abnormal; Notable for the following components:      Result Value   Sodium 134 (*)    Glucose, Bld 112 (*)    BUN 30 (*)    Creatinine, Ser 2.05 (*)    Calcium  8.6 (*)    GFR, Estimated 33 (*)    All other components within normal limits  CBC - Abnormal; Notable for the following components:   WBC 1.2 (*)    RBC 3.49 (*)    Hemoglobin 11.4 (*)    HCT 34.5 (*)    Platelets 78 (*)    All other components within normal limits  PROTIME-INR - Abnormal; Notable for the following components:   Prothrombin Time 24.4 (*)    INR 2.2 (*)    All other components within normal limits  URINALYSIS, ROUTINE W REFLEX MICROSCOPIC - Abnormal; Notable for the following components:   Hgb urine dipstick SMALL (*)    All other components within normal limits  CBC WITH DIFFERENTIAL/PLATELET - Abnormal; Notable for the following components:   WBC 1.1 (*)    RBC 3.47 (*)    Hemoglobin 11.6 (*)    HCT 34.0 (*)    Platelets 81 (*)    Neutro Abs 0.5 (*)    Lymphs Abs 0.4 (*)    All other components within normal limits  TROPONIN I (HIGH SENSITIVITY) - Abnormal; Notable for the following components:    Troponin I (High Sensitivity) 23 (*)    All other components within normal limits  TROPONIN I (HIGH SENSITIVITY) - Abnormal; Notable for the following components:   Troponin I (High Sensitivity) 21 (*)    All other components within normal limits  RESP PANEL BY RT-PCR (FLU A&B, COVID) ARPGX2  CULTURE, BLOOD (ROUTINE X 2)  CULTURE, BLOOD (ROUTINE X 2)  URINE CULTURE  LACTIC ACID, PLASMA  LACTIC ACID, PLASMA    EKG None  Radiology DG Chest 2 View  Result Date: 05/29/2021 CLINICAL DATA:  Weakness/dizziness x 1 month, fever x last night - per pt, HTN, past smoker Tech wore surgical mask/pt had on mask EXAM: CHEST - 2 VIEW COMPARISON:  11/02/2014 FINDINGS: Relatively low lung volumes.  Lungs are clear. Heart size and mediastinal contours are within normal limits. Previous AVR. No effusion.  No pneumothorax. Sternotomy wires, fractured. IMPRESSION: No acute cardiopulmonary disease. Electronically Signed   By: Lucrezia Europe M.D.   On: 05/29/2021 12:02    Procedures Procedures   Medications Ordered in ED Medications  sodium chloride 0.9 % bolus 1,000 mL (0 mLs Intravenous Stopped 05/29/21 1408)  ciprofloxacin (CIPRO) tablet 500 mg (500 mg Oral Given 05/29/21 1411)    ED Course  I have reviewed the triage vital signs and the nursing notes.  Pertinent labs & imaging results that were available during my care of the patient were reviewed by me and considered in my medical decision making (see chart for details).    MDM Rules/Calculators/A&P                          No clear source of an infection.  He is not febrile here.  He is neutropenic which I discussed with his oncologist, [name].  At first he recommends a dose of Cipro and prescription of Cipro but after being given this it is realized that this should not be given as he is on warfarin.  Discussed with pharmacy and is recommended he get an INR checked in 2-3 days.  Changed to Omnicef to broadly cover for potential infection, though  again there is no clear infection at this point.  Otherwise [name] would like the patient to call his office tomorrow. Final Clinical Impression(s) / ED Diagnoses Final diagnoses:  Neutropenia, unspecified type [facility])    Rx / DC Orders ED Discharge Orders          Ordered    cefdinir (OMNICEF) 300 MG capsule  2 times daily        05/29/21 1517             Sherwood Gambler, MD 05/29/21 1608

## 2021-05-29 NOTE — Discharge Instructions (Addendum)
Call Dr. Antonieta Pert office tomorrow morning.  Because of the antibiotics you were given you will need to get your INR rechecked later this week, ideally Friday, 7/22.    If you develop new or worsening fever or any other new symptoms such as severe headache, trouble breathing, or any other new/concerning symptoms then return to the ER.

## 2021-05-29 NOTE — ED Triage Notes (Signed)
Pt arrives POV from home with complaints of fever and weakness. Weakness symptoms X1 month. Fever began last night. CLL pt. Called primary care and was told to come here

## 2021-05-29 NOTE — ED Provider Notes (Signed)
Emergency Medicine Provider Triage Evaluation Note  [name] , a 75 y.o. male  was evaluated in triage.  Pt complains of fever and weakness. Pt has CLL.   Review of Systems  Positive: fever Negative: cough  Physical Exam  BP 103/86 (BP Location: Left Arm)   Pulse 66   Temp 98.2 F (36.8 C) (Oral)   Resp 14   Ht 5\' 10"  (1.778 m)   Wt 93.4 kg   SpO2 99%   BMI 29.56 kg/m  Gen:   Awake, no distress   Resp:  Normal effort  MSK:   Moves extremities without difficulty  Other:    Medical Decision Making  Medically screening exam initiated at 11:20 AM.  Appropriate orders placed.  [name] was informed that the remainder of the evaluation will be completed by another provider, this initial triage assessment does not replace that evaluation, and the importance of remaining in the ED until their evaluation is complete.     [name], [name] 05/29/21 1121    Milton Ferguson, MD 05/31/21 1012

## 2021-05-30 ENCOUNTER — Telehealth: Payer: Self-pay | Admitting: *Deleted

## 2021-05-30 ENCOUNTER — Other Ambulatory Visit: Payer: No Typology Code available for payment source

## 2021-05-30 ENCOUNTER — Encounter: Payer: Self-pay | Admitting: Hematology & Oncology

## 2021-05-30 DIAGNOSIS — C9110 Chronic lymphocytic leukemia of B-cell type not having achieved remission: Secondary | ICD-10-CM

## 2021-05-30 LAB — URINE CULTURE: Culture: NO GROWTH

## 2021-05-30 NOTE — Telephone Encounter (Signed)
Per scheduling message 05/30/21 - Lorriane Shire - called and gave lab only appointment - confirmed

## 2021-05-30 NOTE — Telephone Encounter (Signed)
Call received from patient to receive further instruction from [name] regarding ER visit from 05/29/21. Pt instructed per order of [name] to continue antibiotics until they are complete, to call with any further temperature >100.4 and to return on Friday, 06/01/21 for lab only visit.  Teach back done.  Pt appreciative of information and has no further questions at this time.

## 2021-06-01 ENCOUNTER — Inpatient Hospital Stay: Payer: Medicare Other | Attending: Hematology & Oncology

## 2021-06-01 ENCOUNTER — Encounter: Payer: Self-pay | Admitting: Hematology & Oncology

## 2021-06-01 ENCOUNTER — Other Ambulatory Visit: Payer: Self-pay

## 2021-06-01 DIAGNOSIS — C9110 Chronic lymphocytic leukemia of B-cell type not having achieved remission: Secondary | ICD-10-CM | POA: Diagnosis present

## 2021-06-01 LAB — CBC WITH DIFFERENTIAL (CANCER CENTER ONLY)
Abs Immature Granulocytes: 0 10*3/uL (ref 0.00–0.07)
Basophils Absolute: 0 10*3/uL (ref 0.0–0.1)
Basophils Relative: 0 %
Eosinophils Absolute: 0 10*3/uL (ref 0.0–0.5)
Eosinophils Relative: 1 %
HCT: 32.3 % — ABNORMAL LOW (ref 39.0–52.0)
Hemoglobin: 11 g/dL — ABNORMAL LOW (ref 13.0–17.0)
Immature Granulocytes: 0 %
Lymphocytes Relative: 45 %
Lymphs Abs: 0.6 10*3/uL — ABNORMAL LOW (ref 0.7–4.0)
MCH: 32.8 pg (ref 26.0–34.0)
MCHC: 34.1 g/dL (ref 30.0–36.0)
MCV: 96.4 fL (ref 80.0–100.0)
Monocytes Absolute: 0.3 10*3/uL (ref 0.1–1.0)
Monocytes Relative: 18 %
Neutro Abs: 0.5 10*3/uL — ABNORMAL LOW (ref 1.7–7.7)
Neutrophils Relative %: 36 %
Platelet Count: 88 10*3/uL — ABNORMAL LOW (ref 150–400)
RBC: 3.35 MIL/uL — ABNORMAL LOW (ref 4.22–5.81)
RDW: 13.1 % (ref 11.5–15.5)
WBC Count: 1.4 10*3/uL — ABNORMAL LOW (ref 4.0–10.5)
nRBC: 0 % (ref 0.0–0.2)

## 2021-06-01 LAB — CMP (CANCER CENTER ONLY)
ALT: 27 U/L (ref 0–44)
AST: 35 U/L (ref 15–41)
Albumin: 3.8 g/dL (ref 3.5–5.0)
Alkaline Phosphatase: 71 U/L (ref 38–126)
Anion gap: 7 (ref 5–15)
BUN: 21 mg/dL (ref 8–23)
CO2: 29 mmol/L (ref 22–32)
Calcium: 9.1 mg/dL (ref 8.9–10.3)
Chloride: 101 mmol/L (ref 98–111)
Creatinine: 1.48 mg/dL — ABNORMAL HIGH (ref 0.61–1.24)
GFR, Estimated: 49 mL/min — ABNORMAL LOW (ref >60)
Glucose, Bld: 101 mg/dL — ABNORMAL HIGH (ref 70–99)
Potassium: 3.8 mmol/L (ref 3.5–5.1)
Sodium: 137 mmol/L (ref 135–145)
Total Bilirubin: 0.4 mg/dL (ref 0.3–1.2)
Total Protein: 6.2 g/dL — ABNORMAL LOW (ref 6.5–8.1)

## 2021-06-03 LAB — CULTURE, BLOOD (ROUTINE X 2)
Culture: NO GROWTH
Culture: NO GROWTH
Special Requests: ADEQUATE

## 2021-06-04 ENCOUNTER — Encounter: Payer: Self-pay | Admitting: Hematology & Oncology

## 2021-06-06 ENCOUNTER — Encounter: Payer: Self-pay | Admitting: Hematology & Oncology

## 2021-06-06 ENCOUNTER — Other Ambulatory Visit: Payer: Self-pay

## 2021-06-06 ENCOUNTER — Ambulatory Visit (INDEPENDENT_AMBULATORY_CARE_PROVIDER_SITE_OTHER): Payer: No Typology Code available for payment source | Admitting: *Deleted

## 2021-06-06 ENCOUNTER — Other Ambulatory Visit: Payer: Self-pay | Admitting: Family Medicine

## 2021-06-06 ENCOUNTER — Other Ambulatory Visit: Payer: Self-pay | Admitting: Hematology & Oncology

## 2021-06-06 DIAGNOSIS — I480 Paroxysmal atrial fibrillation: Secondary | ICD-10-CM | POA: Diagnosis not present

## 2021-06-06 DIAGNOSIS — I2699 Other pulmonary embolism without acute cor pulmonale: Secondary | ICD-10-CM

## 2021-06-06 DIAGNOSIS — Z952 Presence of prosthetic heart valve: Secondary | ICD-10-CM | POA: Diagnosis not present

## 2021-06-06 DIAGNOSIS — Z5181 Encounter for therapeutic drug level monitoring: Secondary | ICD-10-CM

## 2021-06-06 LAB — POCT INR: INR: 2.7 (ref 2.0–3.0)

## 2021-06-06 NOTE — Patient Instructions (Signed)
Description   Continue taking Warfarin 1.5 tablets daily except 1 tablet on Mondays. Recheck in 2 weeks. Pt stopped taking Venclexta 05/26/2021. Please call with any questions or concerns please call [facility] [phone] Main [phone]

## 2021-06-11 ENCOUNTER — Other Ambulatory Visit: Payer: Self-pay | Admitting: *Deleted

## 2021-06-14 ENCOUNTER — Ambulatory Visit (INDEPENDENT_AMBULATORY_CARE_PROVIDER_SITE_OTHER): Payer: Medicare Other | Admitting: Psychology

## 2021-06-14 DIAGNOSIS — F4321 Adjustment disorder with depressed mood: Secondary | ICD-10-CM | POA: Diagnosis not present

## 2021-06-18 ENCOUNTER — Encounter (HOSPITAL_COMMUNITY): Payer: Self-pay

## 2021-06-18 ENCOUNTER — Emergency Department (HOSPITAL_COMMUNITY)
Admission: EM | Admit: 2021-06-18 | Discharge: 2021-06-18 | Disposition: A | Discharge status: Home / Self Care | Payer: No Typology Code available for payment source | Attending: Emergency Medicine | Admitting: Emergency Medicine

## 2021-06-18 ENCOUNTER — Emergency Department (HOSPITAL_COMMUNITY): Payer: No Typology Code available for payment source

## 2021-06-18 ENCOUNTER — Inpatient Hospital Stay: Payer: Medicare Other | Attending: Hematology & Oncology

## 2021-06-18 ENCOUNTER — Other Ambulatory Visit: Payer: Self-pay

## 2021-06-18 ENCOUNTER — Encounter: Payer: Self-pay | Admitting: Hematology & Oncology

## 2021-06-18 ENCOUNTER — Other Ambulatory Visit: Payer: Self-pay | Admitting: *Deleted

## 2021-06-18 ENCOUNTER — Emergency Department (HOSPITAL_COMMUNITY)
Admission: EM | Admit: 2021-06-18 | Discharge: 2021-06-19 | Disposition: A | Discharge status: Home / Self Care | Payer: No Typology Code available for payment source | Source: Home / Self Care | Attending: Emergency Medicine | Admitting: Emergency Medicine

## 2021-06-18 DIAGNOSIS — N289 Disorder of kidney and ureter, unspecified: Secondary | ICD-10-CM | POA: Insufficient documentation

## 2021-06-18 DIAGNOSIS — Z7901 Long term (current) use of anticoagulants: Secondary | ICD-10-CM | POA: Insufficient documentation

## 2021-06-18 DIAGNOSIS — I129 Hypertensive chronic kidney disease with stage 1 through stage 4 chronic kidney disease, or unspecified chronic kidney disease: Secondary | ICD-10-CM | POA: Insufficient documentation

## 2021-06-18 DIAGNOSIS — D701 Agranulocytosis secondary to cancer chemotherapy: Secondary | ICD-10-CM

## 2021-06-18 DIAGNOSIS — I4891 Unspecified atrial fibrillation: Secondary | ICD-10-CM | POA: Insufficient documentation

## 2021-06-18 DIAGNOSIS — E871 Hypo-osmolality and hyponatremia: Secondary | ICD-10-CM | POA: Insufficient documentation

## 2021-06-18 DIAGNOSIS — R509 Fever, unspecified: Secondary | ICD-10-CM | POA: Diagnosis present

## 2021-06-18 DIAGNOSIS — D649 Anemia, unspecified: Secondary | ICD-10-CM | POA: Insufficient documentation

## 2021-06-18 DIAGNOSIS — Z5321 Procedure and treatment not carried out due to patient leaving prior to being seen by health care provider: Secondary | ICD-10-CM | POA: Insufficient documentation

## 2021-06-18 DIAGNOSIS — N183 Chronic kidney disease, stage 3 unspecified: Secondary | ICD-10-CM | POA: Insufficient documentation

## 2021-06-18 DIAGNOSIS — Z79899 Other long term (current) drug therapy: Secondary | ICD-10-CM | POA: Insufficient documentation

## 2021-06-18 DIAGNOSIS — C9110 Chronic lymphocytic leukemia of B-cell type not having achieved remission: Secondary | ICD-10-CM | POA: Insufficient documentation

## 2021-06-18 DIAGNOSIS — D696 Thrombocytopenia, unspecified: Secondary | ICD-10-CM

## 2021-06-18 DIAGNOSIS — Z20822 Contact with and (suspected) exposure to covid-19: Secondary | ICD-10-CM | POA: Insufficient documentation

## 2021-06-18 DIAGNOSIS — Z8546 Personal history of malignant neoplasm of prostate: Secondary | ICD-10-CM | POA: Insufficient documentation

## 2021-06-18 DIAGNOSIS — Z87891 Personal history of nicotine dependence: Secondary | ICD-10-CM | POA: Insufficient documentation

## 2021-06-18 DIAGNOSIS — I2699 Other pulmonary embolism without acute cor pulmonale: Secondary | ICD-10-CM | POA: Diagnosis not present

## 2021-06-18 DIAGNOSIS — R911 Solitary pulmonary nodule: Secondary | ICD-10-CM | POA: Insufficient documentation

## 2021-06-18 LAB — CMP (CANCER CENTER ONLY)
ALT: 19 U/L (ref 0–44)
AST: 19 U/L (ref 15–41)
Albumin: 3.9 g/dL (ref 3.5–5.0)
Alkaline Phosphatase: 69 U/L (ref 38–126)
Anion gap: 8 (ref 5–15)
BUN: 26 mg/dL — ABNORMAL HIGH (ref 8–23)
CO2: 27 mmol/L (ref 22–32)
Calcium: 9.3 mg/dL (ref 8.9–10.3)
Chloride: 100 mmol/L (ref 98–111)
Creatinine: 1.61 mg/dL — ABNORMAL HIGH (ref 0.61–1.24)
GFR, Estimated: 44 mL/min — ABNORMAL LOW (ref >60)
Glucose, Bld: 78 mg/dL (ref 70–99)
Potassium: 4.1 mmol/L (ref 3.5–5.1)
Sodium: 135 mmol/L (ref 135–145)
Total Bilirubin: 0.5 mg/dL (ref 0.3–1.2)
Total Protein: 6.1 g/dL — ABNORMAL LOW (ref 6.5–8.1)

## 2021-06-18 LAB — URINALYSIS, ROUTINE W REFLEX MICROSCOPIC
Bacteria, UA: NONE SEEN
Bilirubin Urine: NEGATIVE
Glucose, UA: NEGATIVE mg/dL
Ketones, ur: NEGATIVE mg/dL
Leukocytes,Ua: NEGATIVE
Nitrite: NEGATIVE
Protein, ur: NEGATIVE mg/dL
Specific Gravity, Urine: 1.02 (ref 1.005–1.030)
pH: 6 (ref 5.0–8.0)

## 2021-06-18 LAB — CBC WITH DIFFERENTIAL (CANCER CENTER ONLY)
Abs Immature Granulocytes: 0.02 10*3/uL (ref 0.00–0.07)
Basophils Absolute: 0 10*3/uL (ref 0.0–0.1)
Basophils Relative: 1 %
Eosinophils Absolute: 0 10*3/uL (ref 0.0–0.5)
Eosinophils Relative: 1 %
HCT: 36 % — ABNORMAL LOW (ref 39.0–52.0)
Hemoglobin: 12 g/dL — ABNORMAL LOW (ref 13.0–17.0)
Immature Granulocytes: 1 %
Lymphocytes Relative: 30 %
Lymphs Abs: 0.6 10*3/uL — ABNORMAL LOW (ref 0.7–4.0)
MCH: 32.6 pg (ref 26.0–34.0)
MCHC: 33.3 g/dL (ref 30.0–36.0)
MCV: 97.8 fL (ref 80.0–100.0)
Monocytes Absolute: 0.5 10*3/uL (ref 0.1–1.0)
Monocytes Relative: 25 %
Neutro Abs: 0.9 10*3/uL — ABNORMAL LOW (ref 1.7–7.7)
Neutrophils Relative %: 42 %
Platelet Count: 80 10*3/uL — ABNORMAL LOW (ref 150–400)
RBC: 3.68 MIL/uL — ABNORMAL LOW (ref 4.22–5.81)
RDW: 13.1 % (ref 11.5–15.5)
WBC Count: 2 10*3/uL — ABNORMAL LOW (ref 4.0–10.5)
nRBC: 0 % (ref 0.0–0.2)

## 2021-06-18 LAB — PROTIME-INR
INR: 2.9 — ABNORMAL HIGH (ref 0.8–1.2)
Prothrombin Time: 30.6 seconds — ABNORMAL HIGH (ref 11.4–15.2)

## 2021-06-18 NOTE — ED Provider Notes (Signed)
Emergency Medicine Provider Triage Evaluation Note  [name] , a 75 y.o. male  was evaluated in triage.  Pt complains of chills onset today. Tmax at home 101F. Took '1000mg'$  Tylenol 45 minutes PTA. Is currently on chemotherapy for CLL. Finished course of Cefdinir 5 days ago. No cough, congestion, vomiting, urinary sxs. Hx of neutropenia.  Review of Systems  Positive: Fever, chills Negative: V/D, URI sxs  Physical Exam  BP 112/70 (BP Location: Left Arm)   Pulse 100   Temp (!) 102.6 F (39.2 C) (Oral)   Resp 20   Ht '5\' 10"'$  (1.778 m)   Wt 91.2 kg   SpO2 96%   BMI 28.84 kg/m  Gen:   Awake, no distress   Resp:  Normal effort  MSK:   Moves extremities without difficulty  Other:  Tachycardic   Medical Decision Making  Medically screening exam initiated at 10:49 PM.  Appropriate orders placed.  [name] was informed that the remainder of the evaluation will be completed by another provider, this initial triage assessment does not replace that evaluation, and the importance of remaining in the ED until their evaluation is complete.  Neutropenic fever   [name], [name] 06/18/21 2251    Isla Pence, MD 06/19/21 1735

## 2021-06-18 NOTE — ED Notes (Signed)
Pt urine specimen/UA obtained and placed in triage urine bin, holding for order. 

## 2021-06-18 NOTE — ED Triage Notes (Signed)
Pt states he came in d/t fever.  Had chills earlier and went to [facility] to be seen.  Pt had blood work at oncologists office earlier.  Has hx of neutropenic fever.

## 2021-06-18 NOTE — ED Notes (Signed)
Pt ambulatory in ED lobby. 

## 2021-06-18 NOTE — ED Provider Notes (Cosign Needed)
Emergency Medicine Provider Triage Evaluation Note  [name] , a 75 y.o. male  was evaluated in triage.  Pt complains of fever she noted today with a temperature of 100.  He is currently on a chemo treatment with dual therapy regulated by [name].  Prior visit to the ED for neutropenia, given scription of cefdinir and disposition home.  Has no other complaints.  Review of Systems  Positive: fever Negative: Shortness of breath, chest pain, abdominal pain  Physical Exam  BP 114/77 (BP Location: Left Arm)   Pulse 95   Temp 100 F (37.8 C) (Oral)   Resp 18   Ht '5\' 10"'$  (1.778 m)   Wt 91.2 kg   SpO2 100%   BMI 28.84 kg/m  Gen:   Awake, no distress   Resp:  Normal effort  MSK:   Moves extremities without difficulty  Other:    Medical Decision Making  Medically screening exam initiated at 8:08 PM.  Appropriate orders placed.  Comer Buyer Booher was informed that the remainder of the evaluation will be completed by another provider, this initial triage assessment does not replace that evaluation, and the importance of remaining in the ED until their evaluation is complete.  Labs were obtained this morning, markable for leukopenia.  Denies any infectious signs/ symptoms such as shortness of breath, cough, abdominal pain or other complaints.   Janeece Fitting, PA-C 06/18/21 2013

## 2021-06-18 NOTE — ED Triage Notes (Signed)
Pt states that he was told to come and be seen when he has a fever. Pt sees an Materials engineer for treatment. Pt complains of chills and fever only.

## 2021-06-19 LAB — CBC WITH DIFFERENTIAL/PLATELET
Abs Immature Granulocytes: 0.01 10*3/uL (ref 0.00–0.07)
Basophils Absolute: 0 10*3/uL (ref 0.0–0.1)
Basophils Relative: 0 %
Eosinophils Absolute: 0 10*3/uL (ref 0.0–0.5)
Eosinophils Relative: 0 %
HCT: 36 % — ABNORMAL LOW (ref 39.0–52.0)
Hemoglobin: 12 g/dL — ABNORMAL LOW (ref 13.0–17.0)
Immature Granulocytes: 0 %
Lymphocytes Relative: 31 %
Lymphs Abs: 0.7 10*3/uL (ref 0.7–4.0)
MCH: 32.5 pg (ref 26.0–34.0)
MCHC: 33.3 g/dL (ref 30.0–36.0)
MCV: 97.6 fL (ref 80.0–100.0)
Monocytes Absolute: 0.5 10*3/uL (ref 0.1–1.0)
Monocytes Relative: 22 %
Neutro Abs: 1.1 10*3/uL — ABNORMAL LOW (ref 1.7–7.7)
Neutrophils Relative %: 47 %
Platelets: 76 10*3/uL — ABNORMAL LOW (ref 150–400)
RBC: 3.69 MIL/uL — ABNORMAL LOW (ref 4.22–5.81)
RDW: 13.1 % (ref 11.5–15.5)
WBC: 2.3 10*3/uL — ABNORMAL LOW (ref 4.0–10.5)
nRBC: 0 % (ref 0.0–0.2)

## 2021-06-19 LAB — RESP PANEL BY RT-PCR (FLU A&B, COVID) ARPGX2
Influenza A by PCR: NEGATIVE
Influenza B by PCR: NEGATIVE
SARS Coronavirus 2 by RT PCR: NEGATIVE

## 2021-06-19 LAB — COMPREHENSIVE METABOLIC PANEL
ALT: 23 U/L (ref 0–44)
AST: 23 U/L (ref 15–41)
Albumin: 3.6 g/dL (ref 3.5–5.0)
Alkaline Phosphatase: 67 U/L (ref 38–126)
Anion gap: 11 (ref 5–15)
BUN: 34 mg/dL — ABNORMAL HIGH (ref 8–23)
CO2: 21 mmol/L — ABNORMAL LOW (ref 22–32)
Calcium: 8.9 mg/dL (ref 8.9–10.3)
Chloride: 101 mmol/L (ref 98–111)
Creatinine, Ser: 1.94 mg/dL — ABNORMAL HIGH (ref 0.61–1.24)
GFR, Estimated: 35 mL/min — ABNORMAL LOW (ref >60)
Glucose, Bld: 100 mg/dL — ABNORMAL HIGH (ref 70–99)
Potassium: 4.1 mmol/L (ref 3.5–5.1)
Sodium: 133 mmol/L — ABNORMAL LOW (ref 135–145)
Total Bilirubin: 0.7 mg/dL (ref 0.3–1.2)
Total Protein: 6.4 g/dL — ABNORMAL LOW (ref 6.5–8.1)

## 2021-06-19 LAB — LACTIC ACID, PLASMA: Lactic Acid, Venous: 1 mmol/L (ref 0.5–1.9)

## 2021-06-19 MED ORDER — CEFDINIR 300 MG PO CAPS
300.0000 mg | ORAL_CAPSULE | Freq: Once | ORAL | Status: AC
  Administered 2021-06-19: 300 mg via ORAL
  Filled 2021-06-19: qty 1

## 2021-06-19 MED ORDER — CEFDINIR 300 MG PO CAPS: 300.0000 mg | ORAL_CAPSULE | Freq: Two times a day (BID) | ORAL | 0 refills | Status: DC

## 2021-06-19 NOTE — Discharge Instructions (Addendum)
Return if you are having any problems. 

## 2021-06-19 NOTE — ED Provider Notes (Signed)
[facility] EMERGENCY DEPARTMENT Provider Note   CSN: [id number] Arrival date & time: 06/18/21  2216     History Chief Complaint  Patient presents with   Fever    [name] is a 75 y.o. male.  The history is provided by the patient.  Fever He has history of hypertension, hyperlipidemia, paroxysmal atrial fibrillation, aortic valve replacement anticoagulated on warfarin, chronic lymphocytic leukemia and comes in because of fever and chills today.  Temperature was as high as 101.  He also had some sweats.  He denies any rhinorrhea, sore throat, cough.  He denies abdominal pain, nausea, vomiting, diarrhea.  He denies any urinary urgency, frequency, tenesmus, dysuria.  He denies any sick contacts.   Past Medical History:  Diagnosis Date   Anticoagulants causing adverse effect in therapeutic use    Anxiety    Paxil seemed to cause odd neuro side effects; better on prozac as of 09/2015   Aortic regurgitation 04/2007 surgery   Resolved with tissue AVR at [facility]   Cervical spondylosis    ESI by [name] in [place]. 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 (most recent 07/2017)   History of prostate cancer 2003   Rad prost   History of pulmonary embolism 2011; 10/2014   Recurrence off of anticoagulation 10/2014: per hem/onc ([name]) pt needs lifelong anticoagulation (hypercoag w/u neg).   Hyperlipidemia    statin-intolerant, except for crestor low dose.   Hypertension    Migraine syndrome    Mitral regurgitation    PAF (paroxysmal atrial fibrillation) (HCC)    Panic attacks    "           "             "                  "                  "                       "                             "                          Pulmonary nodule, right 2015   RLL: resected at [facility] --VATS; Benign RLL nodule (fungal and AFB stains neg).  Plan is for [facility] thoracic surg to do f/u o/v & CT  chest 1 yr    Right-sided headache 07/2016   Primary stabbing HA or migraine per neuro (Dr. Tomi Likens).  Gabapentin and topamax not tolerated.  These resolved spontaneously.    Patient Active Problem List   Diagnosis Date Noted   Encounter for therapeutic drug monitoring 09/21/2019   PTSD (post-traumatic stress disorder) 09/29/2018   BPPV (benign paroxysmal positional vertigo) 09/04/2017   CKD (chronic kidney disease), stage III ([place]) 08/11/2017   Former smoker 08/29/2016   Cervical spondylosis    S/P Bioprosthetic AVR (aortic valve replacement) in 2011 03/22/2015   Recurrent pulmonary embolism ([place]) 11/09/2014   Lung nodule 11/02/2014   Chronic lymphocytic leukemia ([place]) 11/11/2013   Migraine 10/08/2010   GERD 12/11/2009   MITRAL REGURGITATION 04/07/2009   Insomnia 02/03/2008   Anxiety  state 09/06/2007   HLD (hyperlipidemia) 04/20/2007   Essential hypertension 04/20/2007   Atrial fibrillation ([place]) 04/20/2007   PROSTATE CANCER, HX OF 04/20/2007    Past Surgical History:  Procedure Laterality Date   AORTIC VALVE REPLACEMENT  2011   [place]  (bioprosthetic)   CARDIAC CATHETERIZATION  04/2010   Normal coronaries.   Carotid dopplers  08/2015   NORMAL   CATARACT EXTRACTION     L 12/06/09   R 09/14/09   COLONOSCOPY  01/26/13   tic's, o/w normal.  ([place])--recall 10 yrs.   PENILE PROSTHESIS IMPLANT     PROSTATECTOMY  04/2002   for prostate cancer   Pulmonary nodule resection  Fall/winter 2016   [facility]   SHOULDER SURGERY     rotator cuff on both arms    TIBIALIS TENDON TRANSFER / REPAIR Right 03/02/2019   Procedure: RECONSTRUCTION OF RIGHT  ANTERIOR TIBIALIS TENDON;  Surgeon: Erle Crocker, MD;  Location: [place];  Service: Orthopedics;  Laterality: Right;   TRANSTHORACIC ECHOCARDIOGRAM  09/20/2014; 03/2016; 03/2017   2015-mild LVH, EF 50-55%, wall motion nl, grade I diast dysfxn, prosth aort valve good,transaortic gradients decreased compared to echo 11/2013.    03/2016: EF 55-60%, normal LV function, severe LVH, normal diast fxn, mild increase in AV gradient compared to 09/2014 echo.  2018: EF 55-60%, normal LV fxn, grd II DD, AV stable/gradient's stable.       Family History  Problem Relation Age of Onset   Cancer Sister        uterine   Colon cancer Sister 73   Stroke Mother    Prostate cancer Father    Stroke Sister    Stroke Brother    Heart attack Neg Hx     Social History   Tobacco Use   Smoking status: Former    Packs/day: 1.00    Years: 12.00    Pack years: 12.00    Types: Cigarettes    Start date: 11/28/1968    Quit date: 01/29/1981    Years since quitting: 40.4   Smokeless tobacco: Never   Tobacco comments:    quit 75 yo   Vaping Use   Vaping Use: Never used  Substance Use Topics   Alcohol use: Yes    Alcohol/week: 0.0 standard drinks    Comment: occasional   Drug use: No    Home Medications Prior to Admission medications   Medication Sig Start Date End Date Taking? Authorizing Provider  acalabrutinib (CALQUENCE) 100 MG capsule Take 1 capsule (100 mg total) by mouth 2 (two) times daily. 11/27/20   Volanda Napoleon, MD  allopurinol (ZYLOPRIM) 100 MG tablet TAKE 1 TABLET BY MOUTH EVERY DAY 03/12/21   Volanda Napoleon, MD  amoxicillin (AMOXIL) 500 MG capsule Take 500 mg by mouth 2 (two) times daily. Prior to dental procedures.    [provider]  cholecalciferol (VITAMIN D) 1000 units tablet Take 1,000 Units by mouth daily.    [provider]  [employer] MOTION SICKNESS RELIEF 25 MG CHEW CHEW 1 TABLET EVERY DAY AS NEEDED FOR DIZZINESS 06/06/21   [name], MD  hydrochlorothiazide (HYDRODIURIL) 12.5 MG tablet TAKE 1 TABLET BY MOUTH EVERY DAY 04/04/21   [name], MD  ipratropium (ATROVENT) 0.03 % nasal spray Place 2 sprays into both nostrils every 12 (twelve) hours. 10/04/20   Inda Coke, PA  LORazepam (ATIVAN) 0.5 MG tablet TAKE 1 TABLET BY MOUTH EVERY DAY AS NEEDED FOR ANXIETY 02/26/21  [name], MD  omeprazole (PRILOSEC) 40 MG capsule Take 40 mg by mouth daily. 10/18/20   [provider]  rosuvastatin (CRESTOR) 10 MG tablet TAKE 1 TABLET BY MOUTH EVERY DAY 11/01/20   [name], MD  senna (SENOKOT) 8.6 MG tablet Take 2 tablets by mouth daily as needed. 03/07/16   [provider]  sertraline (ZOLOFT) 100 MG tablet Take 100 mg by mouth daily.    [provider]  warfarin (COUMADIN) 5 MG tablet TAKE AS DIRECTED BY COUMADIN CLINIC 03/19/21   Josue Hector, MD    Allergies    Hydrocodone-acetaminophen, Oxycodone hcl, Telmisartan-hctz, No known allergies, Ramipril, Aspirin, Atorvastatin, Buspirone hcl, Buspirone hcl, Codeine, Codeine phosphate, Morphine sulfate, Paroxetine, and Rabeprazole  Review of Systems   Review of Systems  Constitutional:  Positive for fever.  All other systems reviewed and are negative.  Physical Exam Updated Vital Signs BP 99/66 (BP Location: Right Arm)   Pulse 79   Temp 98.8 F (37.1 C)   Resp 18   Ht '5\' 10"'$  (1.778 m)   Wt 91.2 kg   SpO2 95%   BMI 28.84 kg/m   Physical Exam Vitals and nursing note reviewed.  75 year old male, resting comfortably and in no acute distress. Vital signs are normal. Oxygen saturation is 95%, which is normal. Head is normocephalic and atraumatic. PERRLA, EOMI. Oropharynx is clear. Neck is nontender and supple without adenopathy or JVD. Back is nontender and there is no CVA tenderness. Lungs are clear without rales, wheezes, or rhonchi. Chest is nontender. Heart has regular rate and rhythm without murmur. Abdomen is soft, flat, nontender without masses or hepatosplenomegaly and peristalsis is normoactive. Extremities have no cyanosis or edema, full range of motion is present. Skin is warm and dry without rash. Neurologic: Mental status is normal, cranial nerves are intact, there are no motor or sensory deficits.  ED Results / Procedures / Treatments   Labs (all labs  ordered are listed, but only abnormal results are displayed) Labs Reviewed  COMPREHENSIVE METABOLIC PANEL - Abnormal; Notable for the following components:      Result Value   Sodium 133 (*)    CO2 21 (*)    Glucose, Bld 100 (*)    BUN 34 (*)    Creatinine, Ser 1.94 (*)    Total Protein 6.4 (*)    GFR, Estimated 35 (*)    All other components within normal limits  CBC WITH DIFFERENTIAL/PLATELET - Abnormal; Notable for the following components:   WBC 2.3 (*)    RBC 3.69 (*)    Hemoglobin 12.0 (*)    HCT 36.0 (*)    Platelets 76 (*)    Neutro Abs 1.1 (*)    All other components within normal limits  PROTIME-INR - Abnormal; Notable for the following components:   Prothrombin Time 30.6 (*)    INR 2.9 (*)    All other components within normal limits  URINALYSIS, ROUTINE W REFLEX MICROSCOPIC - Abnormal; Notable for the following components:   Hgb urine dipstick SMALL (*)    All other components within normal limits  RESP PANEL BY RT-PCR (FLU A&B, COVID) ARPGX2  CULTURE, BLOOD (ROUTINE X 2)  CULTURE, BLOOD (ROUTINE X 2)  LACTIC ACID, PLASMA  LACTIC ACID, PLASMA    EKG EKG Interpretation  Date/Time:  Monday June 18 2021 23:01:47 EDT Ventricular Rate:  96 PR Interval:  226 QRS Duration: 142 QT Interval:  380 QTC Calculation: 480 R  Axis:   -70 Text Interpretation: Sinus rhythm with 1st degree A-V block Right bundle branch block Left anterior fascicular block ** Bifascicular block ** Left ventricular hypertrophy with repolarization abnormality ( R in aVL , Romhilt-Estes ) Abnormal ECG When compared with ECG of 05/29/2021, No significant change was found Confirmed by Delora Fuel ([id number]) on 06/19/2021 1:15:35 AM  Radiology DG Chest 2 View  Result Date: 06/18/2021 CLINICAL DATA:  Questionable sepsis - evaluate for abnormality Fever. EXAM: CHEST - 2 VIEW COMPARISON:  Most recent radiograph 05/29/2021 FINDINGS: Median sternotomy wires again seen. Prosthetic aortic valve. Normal heart  size with stable mediastinal contours. No focal airspace disease or evidence of pneumonia. No pulmonary edema. No pleural fluid or pneumothorax. No acute osseous abnormalities are seen. Punctate densities again project over the right upper arm. IMPRESSION: No acute chest findings.  No evidence of pneumonia. Electronically Signed   By: Keith Rake M.D.   On: 06/18/2021 23:55    Procedures Procedures   Medications Ordered in ED Medications  cefdinir (OMNICEF) capsule 300 mg (has no administration in time range)    ED Course  I have reviewed the triage vital signs and the nursing notes.  Pertinent labs & imaging results that were available during my care of the patient were reviewed by me and considered in my medical decision making (see chart for details).   MDM Rules/Calculators/A&P                         Fever in patient with chronic lymphocytic leukemia.  Old records are reviewed, and he has had leukopenia recently.  He had a recent ED visit with a similar presentation where he was treated with cefdinir empirically.  Today, WBC is 2.3 with absolute neutrophil count of 1.1.  Although this is low, it is adequate to fight infections.  Mild anemia is present unchanged from baseline.  Thrombocytopenia is present also unchanged from baseline.  Renal insufficiency is present slightly worse than most recent value but within the range that he has been it recently.  Lactic acid is normal.  Urinalysis shows no evidence of infection and chest x-ray shows no evidence of pneumonia.  ECG is unchanged from prior.  He appears clinically well.  Will place back on cefdinir and have him follow-up with PCP or.  Blood and urine cultures were sent today, he is to contact his physician's office in 2 days to get results of cultures.  If cultures are negative, may consider stopping antibiotics.  Final Clinical Impression(s) / ED Diagnoses Final diagnoses:  Fever in adult  Chemotherapy-induced neutropenia (HCC)   Renal insufficiency  Normochromic normocytic anemia  Anticoagulated on warfarin  Thrombocytopenia (HCC)  Hyponatremia    Rx / DC Orders ED Discharge Orders          Ordered    cefdinir (OMNICEF) 300 MG capsule  2 times daily        06/19/21 [id number]             Delora Fuel, MD [phone]

## 2021-06-20 ENCOUNTER — Other Ambulatory Visit: Payer: Self-pay | Admitting: Internal Medicine

## 2021-06-20 ENCOUNTER — Telehealth: Payer: Self-pay

## 2021-06-20 NOTE — Telephone Encounter (Signed)
I am just now seeing this and am out of office this afternoon. Lets try to get him in ASAP here. Have him test for covid with rapid test again though negative in ER. I am not sure what we are treating with the cefdinir but is he taking this still?  I am concerned about the dizziness he is reporting- lets have him triaged by nursing.

## 2021-06-20 NOTE — Telephone Encounter (Signed)
I am already overbooked since my 20 minute catch up blocks have been removed for slot utilization concerns from [facility]. I would normally see him- Tammy can you try to work him in somewhere in someones schedule in [place] that is willing to see a febrile patient in person tomorrow?  Would also forward this to leadership who is requesting improved "slot utilization" which is limiting my ability to work patients in like I have been able to recently

## 2021-06-20 NOTE — Telephone Encounter (Signed)
Spoke with pt who states he plans to f/u with his PCP tomorrow. Advised him to let his PCP know that his b/\p has been running low and with him being on HCTZ might be contributing to the dizziness or even orthostatic hypotention. Pt is concerned this is a s/e to Calquence. Advised pt it could be a s/o but Dr [name] thinks it is more likely due to his b/p.

## 2021-06-20 NOTE — Telephone Encounter (Signed)
See below

## 2021-06-20 NOTE — Telephone Encounter (Signed)
Called patient, gave message below. No openings with you this week, patient has a bone marrow on Friday and would only be available tomorrow. Did send patient through to triage, waiting on note to come back.

## 2021-06-20 NOTE — Telephone Encounter (Signed)
Patient is calling in requesting an appointment today with Dr.Hunter. Advised I didn't have anything and asked what was going on. Patient states he has CLL and has been running a fever, was recently in ED. Spoke with his oncologist who advised him to talk to [place]. Offered appointment tomorrow but patient declined, wanted me to send a message to see what Dr.Hunter would recommend. [name] also has a bone biopsy on Friday.

## 2021-06-20 NOTE — Telephone Encounter (Signed)
Spoke with pt who states he cant walk but a few feet before he gets dizzy to where he has to sit down. Pt wakes up with dizziness almost every day but today was the worse. The fever started this AM. Only other symptoms has been stomach cramps. Pt has not contacted PCP but plans to today. Inquired about driving and pt states he does not have an issue driving only when standing up. Pt is taking Cefdinir as prescribed by ER. Per ER note was told to f/u with PCP. Pt to CB with an update.

## 2021-06-20 NOTE — Telephone Encounter (Signed)
Please schedule pt ASAP per Dr. Yong Channel and sent to triage.

## 2021-06-20 NOTE — Telephone Encounter (Signed)
Nurse Assessment Nurse: [name] Askew, RN, Beth Date/Time Eilene Ghazi Time): 06/20/2021 2:53:05 PM Confirm and document reason for call. If symptomatic, describe symptoms. ---Caller states he is having dizziness off and on for months but this morning was bad. Does the patient have any new or worsening symptoms? ---Yes Will a triage be completed? ---Yes Related visit to physician within the last 2 weeks? ---No Does the PT have any chronic conditions? (i.e. diabetes, asthma, this includes High risk factors for pregnancy, etc.) ---Yes List chronic conditions. ---[name] Is this a behavioral health or substance abuse call? ---No Guidelines Guideline Title Affirmed Question Affirmed Notes Nurse Date/Time (Eastern Time) Dizziness - Vertigo [1] NO dizziness now AND [2] age > 65 [name], [name], [name] 06/20/2021 2:55:00 PM Disp. Time Eilene Ghazi Time) Disposition Final User 06/20/2021 3:02:38 PM See PCP within 24 Hours Yes [name] Askew, RN, Beth PLEASE NOTE: All timestamps contained within this report are represented as [place] Standard Time. CONFIDENTIALTY NOTICE: This fax transmission is intended only for the addressee. It contains information that is legally privileged, confidential or otherwise protected from use or disclosure. If you are not the intended recipient, you are strictly prohibited from reviewing, disclosing, copying using or disseminating any of this information or taking any action in reliance on or regarding this information. If you have received this fax in error, please notify us immediately by telephone so that we can arrange for its return to [name]. Phone: [phone], Toll-Free: [phone], Fax: [phone] Page: 2 of 2 Call Id: [id number] [place] Disagree/Comply Comply Caller Understands Yes PreDisposition Call Doctor Care Advice Given Per Guideline SEE PCP WITHIN 24 HOURS: * IF OFFICE WILL BE OPEN: You need to be examined within the next 24 hours. Call your doctor (or NP/PA) when the office  opens and make an appointment. CALL BACK IF: * Severe headache occurs * Weakness develops in an arm or leg * Unable to walk without falling * Dizziness becomes constant * You become worse CARE ADVICE given per Dizziness - Vertigo (Adult) guideline. Comments User: Melene Muller, RN Date/Time Eilene Ghazi Time): 06/20/2021 3:02:37 PM Called oncologist this morning about running a temp and waiting on call back Referrals REFERRED TO PCP OFFIC

## 2021-06-21 ENCOUNTER — Encounter: Payer: Self-pay | Admitting: Family Medicine

## 2021-06-21 ENCOUNTER — Telehealth (INDEPENDENT_AMBULATORY_CARE_PROVIDER_SITE_OTHER): Payer: No Typology Code available for payment source | Admitting: Family Medicine

## 2021-06-21 ENCOUNTER — Ambulatory Visit (INDEPENDENT_AMBULATORY_CARE_PROVIDER_SITE_OTHER): Payer: No Typology Code available for payment source | Admitting: *Deleted

## 2021-06-21 ENCOUNTER — Other Ambulatory Visit: Payer: Self-pay

## 2021-06-21 ENCOUNTER — Encounter: Payer: Self-pay | Admitting: Hematology & Oncology

## 2021-06-21 VITALS — Temp 98.0°F

## 2021-06-21 DIAGNOSIS — Z5181 Encounter for therapeutic drug level monitoring: Secondary | ICD-10-CM | POA: Diagnosis not present

## 2021-06-21 DIAGNOSIS — Z952 Presence of prosthetic heart valve: Secondary | ICD-10-CM

## 2021-06-21 DIAGNOSIS — I480 Paroxysmal atrial fibrillation: Secondary | ICD-10-CM

## 2021-06-21 DIAGNOSIS — I2699 Other pulmonary embolism without acute cor pulmonale: Secondary | ICD-10-CM

## 2021-06-21 DIAGNOSIS — R509 Fever, unspecified: Secondary | ICD-10-CM | POA: Diagnosis not present

## 2021-06-21 LAB — POCT INR: INR: 3.1 — AB (ref 2.0–3.0)

## 2021-06-21 NOTE — Patient Instructions (Signed)
  I am so glad you are feeling better and am hoping you stay well!  I sent a message to Dr. Ansel Bong office to let them know I talked with you today and to request a close follow up. Please contact their office for follow up if they do not reach out to you in the next 24 hours.   Seek in person care promptly if your symptoms recur, you have fevers, new concerns arise or you are not continuing to feel well with treatment.  It was nice to meet you today. I help Declo out with telemedicine visits on Tuesdays and Thursdays and am available for visits on those days. If you have any concerns or questions following this visit please schedule a follow up visit with your Primary Care doctor or seek care at a local urgent care clinic to avoid delays in care.

## 2021-06-21 NOTE — Telephone Encounter (Signed)
What do advise as far as his procedure tomorrow?

## 2021-06-21 NOTE — Telephone Encounter (Signed)
Returned phone call and gave message below.

## 2021-06-21 NOTE — Telephone Encounter (Signed)
Called patient and got him scheduled for a virtual with Dr.Kim today. Advised that Dr.Hunter would recommend a PCR test. [name] is concerned because he will have to cancel his procedure tomorrow if he doesn't know the results. Wanting to know what he should do about the procedure.

## 2021-06-21 NOTE — Telephone Encounter (Signed)
Called and lm on pt vm tcb, when pt calls please give below message.

## 2021-06-21 NOTE — Patient Instructions (Signed)
Description   Take 1 tablet today and then continue taking Warfarin 1.5 tablets daily except 1 tablet on Mondays. Recheck in 3 weeks. Pt stopped taking Venclexta 05/26/2021. Please call with any questions or concerns please call [facility]madin Clinic [phone] Main [phone]

## 2021-06-21 NOTE — Progress Notes (Signed)
Please advise on where to put patient in for follow up

## 2021-06-21 NOTE — Progress Notes (Signed)
Virtual Visit via Video Note  I connected with [name]  on 06/21/21 at 12:40 PM EDT by a video enabled telemedicine application and verified that I am speaking with the correct person using two identifiers.  Location patient: home, [place] Location provider:work or home office Persons participating in the virtual visit: patient, provider  I discussed the limitations of evaluation and management by telemedicine and the availability of in person appointments. The patient expressed understanding and agreed to proceed.   HPI:  Acute telemedicine visit for fever: -Onset: 3 days ago (seen ER fo evaluation) -Symptoms include: fever is resolved today, did have fevers, chills and headache at times earlier this week, dizziness (but has had this since April and is thought to be 2ndary to hypotension per report) -he was in the ER several days ago for a fever and had an extensive work up per his report and was put on Cefdinir for 10 days -on ROC CXR report, blood cultures x2, urine and flu/covid testing neg at this point -reports today he feels fine with resolution of all symptoms -reports he has been keeping his oncologist updated with all of this -Denies:today denies fevers (temp is 98), cp, sob, headache or any symptoms today -Pertinent past medical history:see below -Pertinent medication allergies: Allergies  Allergen Reactions   Hydrocodone-Acetaminophen Shortness Of Breath   Oxycodone Hcl Shortness Of Breath   Telmisartan-Hctz Shortness Of Breath and Palpitations    Dizziness, too Dizziness, too   No Known Allergies Nausea And Vomiting   Ramipril    Aspirin Other (See Comments) and Rash    Other Reaction: confusion Can't take high dose   Atorvastatin Other (See Comments)    myalgia   Buspirone Hcl Other (See Comments)    headache   Buspirone Hcl Other (See Comments)    headache   Codeine Nausea Only, Other (See Comments), Hives and Nausea And Vomiting    Other Reaction: confusion   Codeine  Phosphate Itching and Nausea Only   Morphine Sulfate Itching   Paroxetine Other (See Comments)    Paresthesias: R arm, R leg, r side of face   Rabeprazole Other (See Comments)    headache  -COVID-19 vaccine status:vaccinated and boosted x2  ROS: See pertinent positives and negatives per HPI.  Past Medical History:  Diagnosis Date   Anticoagulants causing adverse effect in therapeutic use    Anxiety    Paxil seemed to cause odd neuro side effects; better on prozac as of 09/2015   Aortic regurgitation 04/2007 surgery   Resolved with [facility] AVR at [facility]   Cervical spondylosis    ESI by [name] in [place]. 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 (most recent 07/2017)   History of prostate cancer 2003   Rad prost   History of pulmonary embolism 2011; 10/2014   Recurrence off of anticoagulation 10/2014: per hem/onc ([name]) pt needs lifelong anticoagulation (hypercoag w/u neg).   Hyperlipidemia    statin-intolerant, except for crestor low dose.   Hypertension    Migraine syndrome    Mitral regurgitation    PAF (paroxysmal atrial fibrillation) (HCC)    Panic attacks    "           "             "                  "                  "                       "                             "  Pulmonary nodule, right 2015   RLL: resected at [facility] --VATS; Benign RLL nodule (fungal and AFB stains neg).  Plan [facility] surg to do f/u o/v & CT chest 1 yr    Right-sided headache 07/2016   Primary stabbing HA or migraine per neuro (Dr. Tomi Likens).  Gabapentin and topamax not tolerated.  These resolved spontaneously.    Past Surgical History:  Procedure Laterality Date   AORTIC VALVE REPLACEMENT  2011   [place]  (bioprosthetic)   CARDIAC CATHETERIZATION  04/2010   Normal coronaries.   Carotid dopplers  08/2015   NORMAL   CATARACT EXTRACTION     L 12/06/09   R 09/14/09    COLONOSCOPY  01/26/13   tic's, o/w normal.  ([facility])--recall 10 yrs.   PENILE PROSTHESIS IMPLANT     PROSTATECTOMY  04/2002   for prostate cancer   Pulmonary nodule resection  Fall/winter 2016   [facility]   SHOULDER SURGERY     rotator cuff on both arms    TIBIALIS TENDON TRANSFER / REPAIR Right 03/02/2019   Procedure: RECONSTRUCTION OF RIGHT  ANTERIOR TIBIALIS TENDON;  Surgeon: Erle Crocker, MD;  Location: [facility];  Service: Orthopedics;  Laterality: Right;   TRANSTHORACIC ECHOCARDIOGRAM  09/20/2014; 03/2016; 03/2017   2015-mild LVH, EF 50-55%, wall motion nl, grade I diast dysfxn, prosth aort valve good,transaortic gradients decreased compared to echo 11/2013.   03/2016: EF 55-60%, normal LV function, severe LVH, normal diast fxn, mild increase in AV gradient compared to 09/2014 echo.  2018: EF 55-60%, normal LV fxn, grd II DD, AV stable/gradient's stable.     Current Outpatient Medications:    acalabrutinib (CALQUENCE) 100 MG capsule, Take 1 capsule (100 mg total) by mouth 2 (two) times daily., Disp: 60 capsule, Rfl: 6   allopurinol (ZYLOPRIM) 100 MG tablet, TAKE 1 TABLET BY MOUTH EVERY DAY, Disp: 90 tablet, Rfl: 0   amoxicillin (AMOXIL) 500 MG capsule, Take 500 mg by mouth 2 (two) times daily. Prior to dental procedures., Disp: , Rfl:    cefdinir (OMNICEF) 300 MG capsule, Take 1 capsule (300 mg total) by mouth 2 (two) times daily., Disp: 20 capsule, Rfl: 0   cholecalciferol (VITAMIN D) 1000 units tablet, Take 1,000 Units by mouth daily., Disp: , Rfl:    [employer] MOTION SICKNESS RELIEF 25 MG CHEW, CHEW 1 TABLET EVERY DAY AS NEEDED FOR DIZZINESS, Disp: 32 tablet, Rfl: 2   hydrochlorothiazide (HYDRODIURIL) 12.5 MG tablet, TAKE 1 TABLET BY MOUTH EVERY DAY, Disp: 90 tablet, Rfl: 1   ipratropium (ATROVENT) 0.03 % nasal spray, Place 2 sprays into both nostrils every 12 (twelve) hours., Disp: 30 mL, Rfl: 12   LORazepam (ATIVAN) 0.5 MG tablet, TAKE 1 TABLET BY MOUTH EVERY DAY AS NEEDED  FOR ANXIETY, Disp: 30 tablet, Rfl: 2   omeprazole (PRILOSEC) 40 MG capsule, Take 40 mg by mouth daily., Disp: , Rfl:    rosuvastatin (CRESTOR) 10 MG tablet, TAKE 1 TABLET BY MOUTH EVERY DAY, Disp: 90 tablet, Rfl: 3   sertraline (ZOLOFT) 100 MG tablet, Take 100 mg by mouth daily., Disp: , Rfl:    warfarin (COUMADIN) 5 MG tablet, TAKE AS DIRECTED BY COUMADIN CLINIC, Disp: 160 tablet, Rfl: 1  EXAM:  VITALS per patient if applicable:  GENERAL: alert, oriented, appears well and in no acute distress  HEENT: atraumatic, conjunttiva clear, no obvious abnormalities on inspection of external nose and ears  NECK: normal movements of the head and neck  LUNGS: on  inspection no signs of respiratory distress, breathing rate appears normal, no obvious gross SOB, gasping or wheezing  CV: no obvious cyanosis  MS: moves all visible extremities without noticeable abnormality  PSYCH/NEURO: pleasant and cooperative, no obvious depression or anxiety, speech and thought processing grossly intact  ASSESSMENT AND PLAN:  Discussed the following assessment and plan:  Fever, unspecified fever cause  -we discussed possible serious and likely etiologies, options for evaluation and workup, limitations of telemedicine visit vs in person visit, treatment, treatment risks and precautions. Pt prefers to treat via telemedicine empirically rather than in person at this moment. He had evaluation for fever in the ER 3 days ago and started cefdinir for unknown etiology. Today he reports all symptoms have resolved and temp is 98. He opted to self monitor for any new or returning symptoms and close follow up with PCP. Advise prompt medical evaluation if any new or recurrent symptoms and that he also notify oncologist of all of this and of any changes.  Scheduled follow up with PCP offered: sent message to PCP to request a close follow up visit.  Advised to seek prompt in person care if worsening, new symptoms arise, or if is  not continuing to feel well on treatment. Discussed options for inperson care if PCP office not available. Did let this patient know that I only do telemedicine on Tuesdays and Thursdays for [facility]. Advised to schedule follow up visit with PCP or [facility] if any further questions or concerns to avoid delays in care.   I discussed the assessment and treatment plan with the patient. The patient was provided an opportunity to ask questions and all were answered. The patient agreed with the plan and demonstrated an understanding of the instructions.     Lucretia Kern, DO

## 2021-06-21 NOTE — Telephone Encounter (Signed)
I apologize- forgot he had been tested on the 8th and was negative.should be able to have procedure

## 2021-06-22 ENCOUNTER — Ambulatory Visit (HOSPITAL_COMMUNITY)
Admission: RE | Admit: 2021-06-22 | Discharge: 2021-06-22 | Disposition: A | Discharge status: Home / Self Care | Payer: Medicare Other | Source: Ambulatory Visit | Attending: Hematology & Oncology | Admitting: Hematology & Oncology

## 2021-06-22 ENCOUNTER — Other Ambulatory Visit: Payer: Self-pay

## 2021-06-22 ENCOUNTER — Encounter (HOSPITAL_COMMUNITY): Payer: Self-pay

## 2021-06-22 ENCOUNTER — Encounter: Payer: Self-pay | Admitting: Internal Medicine

## 2021-06-22 DIAGNOSIS — Z800 Family history of malignant neoplasm of digestive organs: Secondary | ICD-10-CM | POA: Diagnosis not present

## 2021-06-22 DIAGNOSIS — Z8546 Personal history of malignant neoplasm of prostate: Secondary | ICD-10-CM | POA: Diagnosis not present

## 2021-06-22 DIAGNOSIS — T45515A Adverse effect of anticoagulants, initial encounter: Secondary | ICD-10-CM | POA: Insufficient documentation

## 2021-06-22 DIAGNOSIS — D61818 Other pancytopenia: Secondary | ICD-10-CM | POA: Insufficient documentation

## 2021-06-22 DIAGNOSIS — C9110 Chronic lymphocytic leukemia of B-cell type not having achieved remission: Secondary | ICD-10-CM | POA: Diagnosis present

## 2021-06-22 LAB — CBC WITH DIFFERENTIAL/PLATELET
Abs Immature Granulocytes: 0 10*3/uL (ref 0.00–0.07)
Basophils Absolute: 0 10*3/uL (ref 0.0–0.1)
Basophils Relative: 0 %
Eosinophils Absolute: 0 10*3/uL (ref 0.0–0.5)
Eosinophils Relative: 0 %
HCT: 35.6 % — ABNORMAL LOW (ref 39.0–52.0)
Hemoglobin: 11.9 g/dL — ABNORMAL LOW (ref 13.0–17.0)
Lymphocytes Relative: 26 %
Lymphs Abs: 0.5 10*3/uL — ABNORMAL LOW (ref 0.7–4.0)
MCH: 32.4 pg (ref 26.0–34.0)
MCHC: 33.4 g/dL (ref 30.0–36.0)
MCV: 97 fL (ref 80.0–100.0)
Monocytes Absolute: 0.3 10*3/uL (ref 0.1–1.0)
Monocytes Relative: 17 %
Neutro Abs: 1 10*3/uL — ABNORMAL LOW (ref 1.7–7.7)
Neutrophils Relative %: 57 %
Platelets: 74 10*3/uL — ABNORMAL LOW (ref 150–400)
RBC: 3.67 MIL/uL — ABNORMAL LOW (ref 4.22–5.81)
RDW: 13.2 % (ref 11.5–15.5)
WBC: 1.8 10*3/uL — ABNORMAL LOW (ref 4.0–10.5)
nRBC: 0 % (ref 0.0–0.2)

## 2021-06-22 MED ORDER — SODIUM CHLORIDE 0.9 % IV SOLN: INTRAVENOUS | Status: DC

## 2021-06-22 MED ORDER — MIDAZOLAM HCL 2 MG/2ML IJ SOLN
INTRAMUSCULAR | Status: AC | PRN
  Administered 2021-06-22 (×2): 1 mg via INTRAVENOUS

## 2021-06-22 MED ORDER — MIDAZOLAM HCL 2 MG/2ML IJ SOLN
INTRAMUSCULAR | Status: AC
  Filled 2021-06-22: qty 4

## 2021-06-22 MED ORDER — FENTANYL CITRATE (PF) 100 MCG/2ML IJ SOLN
INTRAMUSCULAR | Status: AC
  Filled 2021-06-22: qty 2

## 2021-06-22 MED ORDER — FENTANYL CITRATE (PF) 100 MCG/2ML IJ SOLN
INTRAMUSCULAR | Status: AC | PRN
  Administered 2021-06-22 (×2): 50 ug via INTRAVENOUS

## 2021-06-22 MED ORDER — OXYMETAZOLINE HCL 0.05 % NA SOLN: 1.0000 | Freq: Two times a day (BID) | NASAL | Status: DC

## 2021-06-22 NOTE — Progress Notes (Signed)
LVM for patient to call back. ?

## 2021-06-22 NOTE — Procedures (Signed)
Interventional Radiology Procedure:   Indications: CLL  Procedure: CT guided bone marrow biopsy  Findings: 2 aspirates and 1 core from right ilium.  Patient developed a nose bleed prior to procedure.  Minimal bleeding noted during the procedure.   Complications: None     EBL: Minimal, less than 10 ml  Plan: Discharge to home in one hour.   Yulitza Shorts R. Anselm Pancoast, MD  Pager: [phone]

## 2021-06-22 NOTE — Progress Notes (Signed)
Patient returned to Short Stay and discharged with no evidence of continued nose bleed. Patient provided education with all questions answered.  Patient verbalized understanding

## 2021-06-22 NOTE — Sedation Documentation (Signed)
Nasal cannula caused pts nose to start bleeding. Pressure held during case. N/C placed on lower lip and O2 increased to 6L sats increased from 88% to 100%.

## 2021-06-22 NOTE — Consult Note (Signed)
     Chief Complaint: Patient was seen in consultation today for bone marrow biopsy   Referring Physician(s): Ennever,Peter R  Supervising Physician: Henn, Adam  Patient Status: [facility] - Out-pt  History of Present Illness: [name] is a 75 y.o. male w/ PMH of CCL, Prostate cancer, aortic regurgitation, PE, HTN, migraines and anxiety. He is here today for CT guided biopsy to r/o minimal residual disease   Past Medical History:  Diagnosis Date   Anticoagulants causing adverse effect in therapeutic use    Anxiety    Paxil seemed to cause odd neuro side effects; better on prozac as of 09/2015   Aortic regurgitation 04/2007 surgery   Resolved with tissue AVR at [facility]   Cervical spondylosis    [place]. [place]. 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 Ennever (most recent 07/2017)   History of prostate cancer 2003   Rad prost   History of pulmonary embolism 2011; 10/2014   Recurrence off of anticoagulation 10/2014: per hem/onc (Dr. Ennever) pt needs lifelong anticoagulation (hypercoag w/u neg).   Hyperlipidemia    statin-intolerant, except for crestor low dose.   Hypertension    Migraine syndrome    Mitral regurgitation    PAF (paroxysmal atrial fibrillation) (HCC)    Panic attacks    "           "             "                  "                  "                       "                             "                          Pulmonary nodule, right 2015   RLL: resected at [facility] --VATS; Benign RLL nodule (fungal and AFB stains neg).  Plan is for [facility] thoracic surg to do f/u o/v & CT chest 1 yr    Right-sided headache 07/2016   Primary stabbing HA or migraine per neuro (Dr. Jaffe).  Gabapentin and topamax not tolerated.  These resolved spontaneously.    Past Surgical History:  Procedure Laterality Date   AORTIC VALVE REPLACEMENT  2011   [facility]  (bioprosthetic)   CARDIAC CATHETERIZATION   04/2010   Normal coronaries.   Carotid dopplers  08/2015   NORMAL   CATARACT EXTRACTION     L 12/06/09   R 09/14/09   COLONOSCOPY  01/26/13   tic's, o/w normal.  ([facility])--recall 10 yrs.   PENILE PROSTHESIS IMPLANT     PROSTATECTOMY  04/2002   for prostate cancer   Pulmonary nodule resection  Fall/winter 2016   [facility]   SHOULDER SURGERY     rotator cuff on both arms    TIBIALIS TENDON TRANSFER / REPAIR Right 03/02/2019   Procedure: RECONSTRUCTION OF RIGHT  ANTERIOR TIBIALIS TENDON;  Surgeon: Adair, Christopher R, MD;  Location: [facility];  Service: Orthopedics;  Laterality: Right;   TRANSTHORACIC ECHOCARDIOGRAM  09/20/2014; 03/2016; 03/2017     2015-mild LVH, EF 50-55%, wall motion nl, grade I diast dysfxn, prosth aort valve good,transaortic gradients decreased compared to echo 11/2013.   03/2016: EF 55-60%, normal LV function, severe LVH, normal diast fxn, mild increase in AV gradient compared to 09/2014 echo.  2018: EF 55-60%, normal LV fxn, grd II DD, AV stable/gradient's stable.    Allergies: Hydrocodone-acetaminophen, Oxycodone hcl, Telmisartan-hctz, No known allergies, Ramipril, Aspirin, Atorvastatin, Buspirone hcl, Buspirone hcl, Codeine, Codeine phosphate, Morphine sulfate, Paroxetine, and Rabeprazole  Medications: Prior to Admission medications   Medication Sig Start Date End Date Taking? Authorizing Provider  acalabrutinib (CALQUENCE) 100 MG capsule Take 1 capsule (100 mg total) by mouth 2 (two) times daily. 11/27/20  Yes Ennever, Peter R, MD  allopurinol (ZYLOPRIM) 100 MG tablet TAKE 1 TABLET BY MOUTH EVERY DAY 03/12/21  Yes Ennever, Peter R, MD  cefdinir (OMNICEF) 300 MG capsule Take 1 capsule (300 mg total) by mouth 2 (two) times daily. 06/19/21  Yes Glick, David, MD  cholecalciferol (VITAMIN D) 1000 units tablet Take 1,000 Units by mouth daily.   Yes [provider]  hydrochlorothiazide (HYDRODIURIL) 12.5 MG tablet TAKE 1 TABLET BY MOUTH EVERY DAY 04/04/21  Yes  Hunter, Stephen O, MD  ipratropium (ATROVENT) 0.03 % nasal spray Place 2 sprays into both nostrils every 12 (twelve) hours. 10/04/20  Yes Worley, Samantha, PA  LORazepam (ATIVAN) 0.5 MG tablet TAKE 1 TABLET BY MOUTH EVERY DAY AS NEEDED FOR ANXIETY 02/26/21  Yes Hunter, Stephen O, MD  omeprazole (PRILOSEC) 40 MG capsule Take 40 mg by mouth daily. 10/18/20  Yes [provider]  rosuvastatin (CRESTOR) 10 MG tablet TAKE 1 TABLET BY MOUTH EVERY DAY 11/01/20  Yes Hunter, Stephen O, MD  sertraline (ZOLOFT) 100 MG tablet Take 100 mg by mouth daily.   Yes [provider]  warfarin (COUMADIN) 5 MG tablet TAKE AS DIRECTED BY COUMADIN CLINIC 03/19/21  Yes Nishan, Peter C, MD  amoxicillin (AMOXIL) 500 MG capsule Take 500 mg by mouth 2 (two) times daily. Prior to dental procedures.    [provider]  CVS MOTION SICKNESS RELIEF 25 MG CHEW CHEW 1 TABLET EVERY DAY AS NEEDED FOR DIZZINESS 06/06/21   Hunter, Stephen O, MD     Family History  Problem Relation Age of Onset   Cancer Sister        uterine   Colon cancer Sister 76   Stroke Mother    Prostate cancer Father    Stroke Sister    Stroke Brother    Heart attack Neg Hx     Social History   Socioeconomic History   Marital status: Single    Spouse name: Not on file   Number of children: Not on file   Years of education: Not on file   Highest education level: Not on file  Occupational History    Comment: Retired - Tobacco   Tobacco Use   Smoking status: Former    Packs/day: 1.00    Years: 12.00    Pack years: 12.00    Types: Cigarettes    Start date: 11/28/1968    Quit date: 01/29/1981    Years since quitting: 40.4   Smokeless tobacco: Never   Tobacco comments:    quit 75 yo   Vaping Use   Vaping Use: Never used  Substance and Sexual Activity   Alcohol use: Yes    Alcohol/week: 0.0 standard drinks    Comment: occasional   Drug use: No   Sexual activity: Not on   file  Other Topics Concern   Not on file  Social  History Narrative   Single. No children. Lives alone      Retired from [employer].       Hobbies: travel, Scientist, product/process development, enjoys being outdoors including hiking   Social Determinants of Radio broadcast assistant Strain: Low Risk    Difficulty of Paying Living Expenses: Not hard at all  Food Insecurity: No Food Insecurity   Worried About Charity fundraiser in the Last Year: Never true   Arboriculturist in the Last Year: Never true  Transportation Needs: No Transportation Needs   Lack of Transportation (Medical): No   Lack of Transportation (Non-Medical): No  Physical Activity: Sufficiently Active   Days of Exercise per Week: 5 days   Minutes of Exercise per Session: 60 min  Stress: No Stress Concern Present   Feeling of Stress : Only a little  Social Connections: Socially Isolated   Frequency of Communication with Friends and Family: More than three times a week   Frequency of Social Gatherings with Friends and Family: More than three times a week   Attends Religious Services: Never   Marine scientist or Organizations: No   Attends Archivist Meetings: Never   Marital Status: Never married      Review of Systems  Constitutional:  Negative for chills and fever.  Respiratory:  Negative for shortness of breath.   Cardiovascular:  Negative for chest pain.  Gastrointestinal:  Negative for abdominal pain, nausea and vomiting.   Vital Signs: BP 124/76   Pulse 73   Temp 97.7 F (36.5 C) (Oral)   Resp 18   SpO2 100%   Physical Exam Constitutional:      Appearance: Normal appearance.  HENT:     Mouth/Throat:     Mouth: Mucous membranes are moist.     Pharynx: Oropharynx is clear.  Cardiovascular:     Rate and Rhythm: Normal rate and regular rhythm.     Heart sounds: Normal heart sounds.  Pulmonary:     Effort: Pulmonary effort is normal.     Breath sounds: Normal breath sounds.  Abdominal:     General: There is no  distension.     Palpations: Abdomen is soft.     Tenderness: There is no abdominal tenderness. There is no guarding.  Musculoskeletal:     Right lower leg: No edema.     Left lower leg: No edema.  Skin:    General: Skin is warm and dry.  Neurological:     Mental Status: He is alert and oriented to person, place, and time.  Psychiatric:        Mood and Affect: Mood normal.        Behavior: Behavior normal.        Thought Content: Thought content normal.        Judgment: Judgment normal.    Imaging: DG Chest 2 View  Result Date: 06/18/2021 CLINICAL DATA:  Questionable sepsis - evaluate for abnormality Fever. EXAM: CHEST - 2 VIEW COMPARISON:  Most recent radiograph 05/29/2021 FINDINGS: Median sternotomy wires again seen. Prosthetic aortic valve. Normal heart size with stable mediastinal contours. No focal airspace disease or evidence of pneumonia. No pulmonary edema. No pleural fluid or pneumothorax. No acute osseous abnormalities are seen. Punctate densities again project over the right upper arm. IMPRESSION: No acute chest findings.  No evidence of pneumonia. Electronically Signed   By:  Melanie  Sanford M.D.   On: 06/18/2021 23:55   DG Chest 2 View  Result Date: 05/29/2021 CLINICAL DATA:  Weakness/dizziness x 1 month, fever x last night - per pt, HTN, past smoker Tech wore surgical mask/pt had on mask EXAM: CHEST - 2 VIEW COMPARISON:  11/02/2014 FINDINGS: Relatively low lung volumes.  Lungs are clear. Heart size and mediastinal contours are within normal limits. Previous AVR. No effusion.  No pneumothorax. Sternotomy wires, fractured. IMPRESSION: No acute cardiopulmonary disease. Electronically Signed   By: D  Hassell M.D.   On: 05/29/2021 12:02    Labs:  CBC: Recent Labs    06/01/21 1059 06/18/21 0915 06/18/21 2308 06/22/21 0922  WBC 1.4* 2.0* 2.3* 1.8*  HGB 11.0* 12.0* 12.0* 11.9*  HCT 32.3* 36.0* 36.0* 35.6*  PLT 88* 80* 76* 74*    COAGS: Recent Labs    05/29/21 1116  06/06/21 1308 06/18/21 2308 06/21/21 1503  INR 2.2* 2.7 2.9* 3.1*    BMP: Recent Labs    07/19/20 1148 07/26/20 1120 08/02/20 1053 08/10/20 1017 09/21/20 1401 05/29/21 1116 06/01/21 1059 06/18/21 0915 06/18/21 2308  NA 139 138 138 138   < > 134* 137 135 133*  K 4.4 4.7 4.9 4.3   < > 3.7 3.8 4.1 4.1  CL 103 104 102 102   < > 99 101 100 101  CO2 28 29 29 30   < > 25 29 27 21*  GLUCOSE 94 97 76 93   < > 112* 101* 78 100*  BUN 23 25* 24* 24*   < > 30* 21 26* 34*  CALCIUM 9.4 9.5 9.7 9.5   < > 8.6* 9.1 9.3 8.9  CREATININE 1.77* 1.58* 1.59* 1.56*   < > 2.05* 1.48* 1.61* 1.94*  GFRNONAA 37* 42* 42* 43*   < > 33* 49* 44* 35*  GFRAA 43* 49* 49* 50*  --   --   --   --   --    < > = values in this interval not displayed.    LIVER FUNCTION TESTS: Recent Labs    05/18/21 1427 06/01/21 1059 06/18/21 0915 06/18/21 2308  BILITOT 0.4 0.4 0.5 0.7  AST 17 35 19 23  ALT 14 27 19 23  ALKPHOS 84 71 69 67  PROT 6.4 6.2* 6.1* 6.4*  ALBUMIN 4.3 3.8 3.9 3.6    TUMOR MARKERS: No results for input(s): AFPTM, CEA, CA199, CHROMGRNA in the last 8760 hours.  Assessment and Plan: Nichollas M Davidovich is a 75 y.o. male w/ PMH of CCL, Prostate cancer, aortic regurgitation, a fib, PE, HTN, migraines and anxiety. He appears well today. He is calm, pleasant and in NAD. He is here today for CT guided biopsy to r/o minimal residual disease   CBC today:  WBC 1.8  Hgb 11.9  Hct 35.6  PLT 74  INR 3.1 yesterday  Risks and benefits of bone marrow biopsy was discussed with the patient and/or patient's family including, but not limited to bleeding, infection, damage to adjacent structures or low yield requiring additional tests.  All of the questions were answered and there is agreement to proceed.  Consent signed and in chart.    Thank you for this interesting consult.  I greatly enjoyed meeting Orvis M Stringfield and look forward to participating in their care.  A copy of this report was sent to the  requesting provider on this date.  Electronically Signed:  T , NP 06/22/2021, 10:26 AM   I   spent a total of 20 minutes in face to face in clinical consultation, greater than 50% of which was counseling/coordinating care for bone marrow biopsy.

## 2021-06-22 NOTE — Discharge Instructions (Signed)

## 2021-06-23 LAB — CULTURE, BLOOD (ROUTINE X 2)
Culture: NO GROWTH
Culture: NO GROWTH

## 2021-06-25 ENCOUNTER — Encounter: Payer: Self-pay | Admitting: Hematology & Oncology

## 2021-06-26 ENCOUNTER — Ambulatory Visit (INDEPENDENT_AMBULATORY_CARE_PROVIDER_SITE_OTHER): Payer: No Typology Code available for payment source | Admitting: Family Medicine

## 2021-06-26 ENCOUNTER — Encounter: Payer: Self-pay | Admitting: Family Medicine

## 2021-06-26 ENCOUNTER — Other Ambulatory Visit: Payer: Self-pay

## 2021-06-26 ENCOUNTER — Encounter: Payer: Self-pay | Admitting: Hematology & Oncology

## 2021-06-26 ENCOUNTER — Ambulatory Visit: Payer: Medicare Other | Admitting: Family Medicine

## 2021-06-26 VITALS — BP 127/78 | HR 62 | Temp 98.7°F | Ht 70.0 in | Wt 196.4 lb

## 2021-06-26 DIAGNOSIS — I4891 Unspecified atrial fibrillation: Secondary | ICD-10-CM | POA: Diagnosis not present

## 2021-06-26 DIAGNOSIS — I10 Essential (primary) hypertension: Secondary | ICD-10-CM | POA: Diagnosis not present

## 2021-06-26 DIAGNOSIS — N183 Chronic kidney disease, stage 3 unspecified: Secondary | ICD-10-CM

## 2021-06-26 DIAGNOSIS — D709 Neutropenia, unspecified: Secondary | ICD-10-CM

## 2021-06-26 DIAGNOSIS — R5081 Fever presenting with conditions classified elsewhere: Secondary | ICD-10-CM

## 2021-06-26 LAB — SURGICAL PATHOLOGY

## 2021-06-26 NOTE — Progress Notes (Signed)
Phone [phone] In person visit   Subjective:   [name] is a 75 y.o. year old very pleasant male patient who presents for/with See problem oriented charting Chief Complaint  Patient presents with   ED Follow up     Bone morrow biopsy.    This visit occurred during the SARS-CoV-2 public health emergency.  Safety protocols were in place, including screening questions prior to the visit, additional usage of staff PPE, and extensive cleaning of exam room while observing appropriate contact time as indicated for disinfecting solutions.   Past Medical History-  Patient Active Problem List   Diagnosis Date Noted   S/P Bioprosthetic AVR (aortic valve replacement) in 2011 03/22/2015    Priority: High   Recurrent pulmonary embolism ([place]) 11/09/2014    Priority: High   Chronic lymphocytic leukemia ([place]) 11/11/2013    Priority: High   Atrial fibrillation ([place]) 04/20/2007    Priority: High   PROSTATE CANCER, HX OF 04/20/2007    Priority: High   PTSD (post-traumatic stress disorder) 09/29/2018    Priority: Medium   BPPV (benign paroxysmal positional vertigo) 09/04/2017    Priority: Medium   CKD (chronic kidney disease), stage III ([place]) 08/11/2017    Priority: Medium   Cervical spondylosis     Priority: Medium   Migraine 10/08/2010    Priority: Medium   Insomnia 02/03/2008    Priority: Medium   Anxiety state 09/06/2007    Priority: Medium   HLD (hyperlipidemia) 04/20/2007    Priority: Medium   Essential hypertension 04/20/2007    Priority: Medium   Former smoker 08/29/2016    Priority: Low   Lung nodule 11/02/2014    Priority: Low   GERD 12/11/2009    Priority: Low   MITRAL REGURGITATION 04/07/2009    Priority: Low   Anticoagulants causing adverse effect in therapeutic use 06/22/2021   Encounter for therapeutic drug monitoring 09/21/2019    Medications- reviewed and updated Current Outpatient Medications  Medication Sig Dispense Refill   acalabrutinib  (CALQUENCE) 100 MG capsule Take 1 capsule (100 mg total) by mouth 2 (two) times daily. 60 capsule 6   allopurinol (ZYLOPRIM) 100 MG tablet TAKE 1 TABLET BY MOUTH EVERY DAY 90 tablet 0   amoxicillin (AMOXIL) 500 MG capsule Take 500 mg by mouth 2 (two) times daily. Prior to dental procedures.     cefdinir (OMNICEF) 300 MG capsule Take 1 capsule (300 mg total) by mouth 2 (two) times daily. 20 capsule 0   cholecalciferol (VITAMIN D) 1000 units tablet Take 1,000 Units by mouth daily.     [employer] MOTION SICKNESS RELIEF 25 MG CHEW CHEW 1 TABLET EVERY DAY AS NEEDED FOR DIZZINESS 32 tablet 2   hydrochlorothiazide (HYDRODIURIL) 12.5 MG tablet TAKE 1 TABLET BY MOUTH EVERY DAY 90 tablet 1   ipratropium (ATROVENT) 0.03 % nasal spray Place 2 sprays into both nostrils every 12 (twelve) hours. 30 mL 12   LORazepam (ATIVAN) 0.5 MG tablet TAKE 1 TABLET BY MOUTH EVERY DAY AS NEEDED FOR ANXIETY 30 tablet 2   omeprazole (PRILOSEC) 40 MG capsule Take 40 mg by mouth daily.     rosuvastatin (CRESTOR) 10 MG tablet TAKE 1 TABLET BY MOUTH EVERY DAY 90 tablet 3   sertraline (ZOLOFT) 100 MG tablet Take 100 mg by mouth daily.     warfarin (COUMADIN) 5 MG tablet TAKE AS DIRECTED BY COUMADIN CLINIC 160 tablet 1   No current facility-administered medications for this visit.     Objective:  BP 127/78   Pulse 62   Temp 98.7 F (37.1 C) (Temporal)   Ht _0  (1.778 m)   Wt 196 lb 6.4 oz (89.1 kg)   SpO2 97%   BMI 28.18 kg/m  Gen: NAD, resting comfortably CV: Regular heart rate no murmurs rubs or gallops Lungs: CTAB no crackles, wheeze, rhonchi Abdomen: soft/nontender/nondistended/normal bowel sounds.  Skin: warm, dry, small scab over bone marrow biopsy site healing well- no surrounding erythema or warmth     Assessment and Plan  # Hospital F/U for Neutropenic Fever  S:75 y.o. male with CLL on  Calquence with neutopenia who presented to ER on 05/29/21 and 06/18/21 due to fever. No clear source of infetion found- cipro  avoided due to being on coumadin and patient started on omnicef/cefdinir for broad coverage After initial visit. Symptoms improved  but patient with recurrence on 06/18/21.  Absolute neutrophil count remains low at 1.1 thousand.  Lactic acid was normal, urinalysis without evidence of infection as well as chest x-ray without pneumonia.  Blood cultures were drawn though I do not see a urine culture-ER provider mentioned potentially stopping antibiotics if cultures were negative-I will have a different opinion on this due to neutropenia and neutropenic fever not always having a clear source.  Patient did have mildly elevated creatinine above baseline up to 1.9.  Patient was discharged on cefdinir as well this time and instructed to follow-up with PCP  Patient did have a virtual visit with Dr. Maudie Mercury on 06/21/2021- he reported all of his symptoms have resolved and opted to monitor for any new or returning symptoms. He was also advised to prompt a medical evaluation if any new or recurrent symptoms and that he also notify oncologist of all of this and of any changes and have a close follow-up with his PCP. A/P: Patient with neutropenic fever on 2 separate occasions-work-up unrevealing for source-we discussed probability of GI source as well as the fact that often neutropenic fever does not show source on work-up such as blood cultures-I would still prefer for him to finish antibiotics and if recurrence would certainly consider doing this again  #CLL with recurrence-patient on venetoclax in past- now on Calquence alone-regular follow-up with oncology-we will change in regimen hoping WBC will increase.  Recently had bone marrow biopsy-overall results appear to be stable/encouraging-I briefly reviewed results with patient but encouraged him that I am certainly not in oncologist and would recommend following up with 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  No evidence of recurrent pulmonary embolism-continue Coumadin for this as well  #hypertension S: medication: Hydrochlorothiazide 12.5 mg daily BP Readings from Last 3 Encounters:  06/26/21 127/78  06/22/21 107/68  06/19/21 115/68  A/P: Well-controlled-continue current medication   #Chronic kidney disease stage III S: GFR is typically in the 40srange-but in the 30s range recently in the emergency room A/P: This is discharged a week ago-reasonable to recheck at next follow-up  #Health maintenance-recommended patient hold off on immunizations until discussing with oncology in light of neutropenia  Recommended follow up: No follow-ups on file. Future Appointments  Date Time Provider [place]  07/05/2021  2:30 PM [facility] None  07/05/2021  3:00 PM Ennever, Rudell Cobb, MD [facility] None  07/12/2021  1:00 PM Oren Binet, PhD [facility] None  07/12/2021  2:15 PM [facility]  08/09/2021  1:00 PM Oren Binet, PhD [facility] None  10/15/2021  8:20 AM [facility] E[facility] 2 [facility]  10/15/2021  9:30 AM Josue Hector, MD [facility]  11/19/2021  1:00 PM [name], MD [facility]  01/21/2022  1:00 PM [facility] HEALTH COACH [facility]    Lab/Order associations:   ICD-10-CM   1. Neutropenic fever ([place])  D70.9    R50.81     2. Essential hypertension  I10     3. Stage 3 chronic kidney disease, unspecified whether stage 3a or  3b CKD ([facility])  N18.30     4. Atrial fibrillation, unspecified type ([place])  I48.91      I,Harris Phan,acting as a scribe for Garret Reddish, MD.,have documented all relevant documentation on the behalf of Garret Reddish, MD,as directed by  Garret Reddish, MD while in the presence of Garret Reddish, MD.   I, Garret Reddish, MD, have reviewed all documentation for this visit. The documentation on 06/26/21 for the exam, diagnosis, procedures, and orders are all accurate and complete.   Return precautions advised.  Garret Reddish, MD

## 2021-06-26 NOTE — Patient Instructions (Addendum)
No vaccinations at present unless guided by [name].  Please finish cefdinir and keep [name] updated if any more fevers  Hopefully [name] will comment on biopsy relatively soon  I do think it is best that you ask [name] about group exercising.  Recommended follow up: keep January visit or sooner if needed

## 2021-06-27 ENCOUNTER — Encounter: Payer: Self-pay | Admitting: Hematology & Oncology

## 2021-06-29 ENCOUNTER — Encounter: Payer: Self-pay | Admitting: Family Medicine

## 2021-07-02 ENCOUNTER — Encounter (HOSPITAL_COMMUNITY): Payer: Self-pay | Admitting: Hematology & Oncology

## 2021-07-05 ENCOUNTER — Other Ambulatory Visit: Payer: Self-pay

## 2021-07-05 ENCOUNTER — Inpatient Hospital Stay (HOSPITAL_BASED_OUTPATIENT_CLINIC_OR_DEPARTMENT_OTHER): Payer: Medicare Other | Admitting: Hematology & Oncology

## 2021-07-05 ENCOUNTER — Inpatient Hospital Stay: Payer: Medicare Other

## 2021-07-05 ENCOUNTER — Encounter: Payer: Self-pay | Admitting: Hematology & Oncology

## 2021-07-05 VITALS — BP 122/81 | HR 84 | Temp 99.5°F | Resp 18 | Wt 200.0 lb

## 2021-07-05 DIAGNOSIS — C9110 Chronic lymphocytic leukemia of B-cell type not having achieved remission: Secondary | ICD-10-CM

## 2021-07-05 LAB — CMP (CANCER CENTER ONLY)
ALT: 16 U/L (ref 0–44)
AST: 21 U/L (ref 15–41)
Albumin: 4 g/dL (ref 3.5–5.0)
Alkaline Phosphatase: 67 U/L (ref 38–126)
Anion gap: 9 (ref 5–15)
BUN: 22 mg/dL (ref 8–23)
CO2: 26 mmol/L (ref 22–32)
Calcium: 9.3 mg/dL (ref 8.9–10.3)
Chloride: 101 mmol/L (ref 98–111)
Creatinine: 1.74 mg/dL — ABNORMAL HIGH (ref 0.61–1.24)
GFR, Estimated: 40 mL/min — ABNORMAL LOW (ref >60)
Glucose, Bld: 124 mg/dL — ABNORMAL HIGH (ref 70–99)
Potassium: 4.1 mmol/L (ref 3.5–5.1)
Sodium: 136 mmol/L (ref 135–145)
Total Bilirubin: 0.4 mg/dL (ref 0.3–1.2)
Total Protein: 6.5 g/dL (ref 6.5–8.1)

## 2021-07-05 LAB — CBC WITH DIFFERENTIAL (CANCER CENTER ONLY)
Abs Immature Granulocytes: 0.02 10*3/uL (ref 0.00–0.07)
Basophils Absolute: 0 10*3/uL (ref 0.0–0.1)
Basophils Relative: 0 %
Eosinophils Absolute: 0 10*3/uL (ref 0.0–0.5)
Eosinophils Relative: 1 %
HCT: 34 % — ABNORMAL LOW (ref 39.0–52.0)
Hemoglobin: 11.3 g/dL — ABNORMAL LOW (ref 13.0–17.0)
Immature Granulocytes: 1 %
Lymphocytes Relative: 25 %
Lymphs Abs: 0.7 10*3/uL (ref 0.7–4.0)
MCH: 32.3 pg (ref 26.0–34.0)
MCHC: 33.2 g/dL (ref 30.0–36.0)
MCV: 97.1 fL (ref 80.0–100.0)
Monocytes Absolute: 0.7 10*3/uL (ref 0.1–1.0)
Monocytes Relative: 25 %
Neutro Abs: 1.3 10*3/uL — ABNORMAL LOW (ref 1.7–7.7)
Neutrophils Relative %: 48 %
Platelet Count: 142 10*3/uL — ABNORMAL LOW (ref 150–400)
RBC: 3.5 MIL/uL — ABNORMAL LOW (ref 4.22–5.81)
RDW: 13.3 % (ref 11.5–15.5)
WBC Count: 2.7 10*3/uL — ABNORMAL LOW (ref 4.0–10.5)
nRBC: 0 % (ref 0.0–0.2)

## 2021-07-05 LAB — LACTATE DEHYDROGENASE: LDH: 280 U/L — ABNORMAL HIGH (ref 98–192)

## 2021-07-05 LAB — SAVE SMEAR(SSMR), FOR PROVIDER SLIDE REVIEW

## 2021-07-05 MED ORDER — VENETOCLAX 100 MG PO TABS: 100.0000 mg | ORAL_TABLET | Freq: Every day | ORAL | 4 refills | Status: DC

## 2021-07-05 NOTE — Progress Notes (Signed)
Hematology and Oncology Follow Up Visit  Jayvien Rowlette Lengyel [id number] 07-18-1946 75 y.o. 07/05/2021   Principle Diagnosis:  Right lower lobe pulmonary nodule- non-malignant Recurrent pulmonary embolism CLL -- progressive -- 11q-/13q-  Current Therapy:   Coumadin 5 mg po q day Calquence 100 mg po BID -- start on 03/13/2020 -- d/c on 07/05/2021 Venetoclax 100 mg po q day -- change 07/05/2021     Interim History:  Mr.  Fleagle is back for follow-up.  He really has had a tough time with treatment.  He really is not tolerant of both treatments at the 1 time.  As such, I really think were going to have to drop the Calquence.  I do believe that the venetoclax is a much more potent drug for him.  As such, I will try to keep him on daily venetoclax but only use the 100 mg dose.  We did go ahead and do a bone marrow biopsy on him.  This was done on 06/22/2021.  The pathology report ([id number]) and this showed a normocellular marrow.  There was minimal involvement by CLL.  By flow cytometry, there is only 1% cells that were monoclonal.  Again, his quality of life has been horrible.  He has been to the emergency room.  He has had a problem with infection.  He has had no energy.  His quality of life is so important for him.  He is always been so functional and so active.  I just do not want to see his quality of life compromised like this.  I talked to him about this.  I think that he is close to what we call minimal residual disease.  It would be nice if he was minimal residual disease negative.  Hopefully we can still get there.  Again, I think we just go with venetoclax 100 mg a day and see how this works for him.  He has had no cardiac issues.  He has had no cough.  He has had no fever.  He has had no change in bowel or bladder habits.  Currently, I would say his performance status by ECOG 1.   Medications:  Current Outpatient Medications:    venetoclax (VENCLEXTA) 100 MG tablet, Take 1 tablet  (100 mg total) by mouth daily. Tablets should be swallowed whole with a meal and a full glass of water., Disp: 30 tablet, Rfl: 4   amoxicillin (AMOXIL) 500 MG capsule, Take 500 mg by mouth 2 (two) times daily. Prior to dental procedures., Disp: , Rfl:    cholecalciferol (VITAMIN D) 1000 units tablet, Take 1,000 Units by mouth daily., Disp: , Rfl:    [employer]S MOTION SICKNESS RELIEF 25 MG CHEW, CHEW 1 TABLET EVERY DAY AS NEEDED FOR DIZZINESS, Disp: 32 tablet, Rfl: 2   hydrochlorothiazide (HYDRODIURIL) 12.5 MG tablet, TAKE 1 TABLET BY MOUTH EVERY DAY, Disp: 90 tablet, Rfl: 1   ipratropium (ATROVENT) 0.03 % nasal spray, Place 2 sprays into both nostrils every 12 (twelve) hours., Disp: 30 mL, Rfl: 12   LORazepam (ATIVAN) 0.5 MG tablet, TAKE 1 TABLET BY MOUTH EVERY DAY AS NEEDED FOR ANXIETY, Disp: 30 tablet, Rfl: 2   omeprazole (PRILOSEC) 40 MG capsule, Take 40 mg by mouth daily., Disp: , Rfl:    rosuvastatin (CRESTOR) 10 MG tablet, TAKE 1 TABLET BY MOUTH EVERY DAY, Disp: 90 tablet, Rfl: 3   sertraline (ZOLOFT) 100 MG tablet, Take 100 mg by mouth daily., Disp: , Rfl:    warfarin (COUMADIN) 5  MG tablet, TAKE AS DIRECTED BY COUMADIN CLINIC, Disp: 160 tablet, Rfl: 1  Allergies:  Allergies  Allergen Reactions   Hydrocodone-Acetaminophen Shortness Of Breath   Oxycodone Hcl Shortness Of Breath   Telmisartan-Hctz Shortness Of Breath and Palpitations    Dizziness, too Dizziness, too   No Known Allergies Nausea And Vomiting   Ramipril    Aspirin Other (See Comments) and Rash    Other Reaction: confusion Can't take high dose   Atorvastatin Other (See Comments)    myalgia   Buspirone Hcl Other (See Comments)    headache   Buspirone Hcl Other (See Comments)    headache   Codeine Nausea Only, Other (See Comments), Hives and Nausea And Vomiting    Other Reaction: confusion   Codeine Phosphate Itching and Nausea Only   Morphine Sulfate Itching   Paroxetine Other (See Comments)    Paresthesias: R arm, R  leg, r side of face   Rabeprazole Other (See Comments)    headache    Past Medical History, Surgical history, Social history, and Family History were reviewed and updated.  Review of Systems: Review of Systems  Constitutional: Negative.   HENT: Negative.    Eyes: Negative.   Respiratory: Negative.    Cardiovascular: Negative.   Gastrointestinal: Negative.   Genitourinary: Negative.   Musculoskeletal: Negative.   Skin: Negative.   Neurological: Negative.   Endo/Heme/Allergies: Negative.   Psychiatric/Behavioral: Negative.     Physical Exam:  weight is 200 lb (90.7 kg). His oral temperature is 99.5 F (37.5 C). His blood pressure is 122/81 and his pulse is 84. His respiration is 18 and oxygen saturation is 98%.   Physical Exam Vitals reviewed.  Constitutional:      Comments: Axillary exam does not show any palpable adenopathy in either axilla. Marland Kitchen    HENT:     Head: Normocephalic and atraumatic.  Eyes:     Pupils: Pupils are equal, round, and reactive to light.  Neck:     Comments: On his neck exam, he has had nice regression of his lymphadenopathy.  I cannot palpate any adenopathy in the cervical or supraclavicular chains. Cardiovascular:     Rate and Rhythm: Normal rate and regular rhythm.     Heart sounds: Normal heart sounds.  Pulmonary:     Effort: Pulmonary effort is normal.     Breath sounds: Normal breath sounds.  Abdominal:     General: Bowel sounds are normal.     Palpations: Abdomen is soft.  Musculoskeletal:        General: No tenderness or deformity. Normal range of motion.     Cervical back: Normal range of motion.  Lymphadenopathy:     Cervical: No cervical adenopathy.  Skin:    General: Skin is warm and dry.     Findings: No erythema or rash.  Neurological:     Mental Status: He is alert and oriented to person, place, and time.  Psychiatric:        Behavior: Behavior normal.        Thought Content: Thought content normal.        Judgment: Judgment  normal.    Lab Results  Component Value Date   WBC 2.7 (L) 07/05/2021   HGB 11.3 (L) 07/05/2021   HCT 34.0 (L) 07/05/2021   MCV 97.1 07/05/2021   PLT 142 (L) 07/05/2021     Chemistry      Component Value Date/Time   NA 136 07/05/2021 1416   NA  145 07/22/2017 0756   NA 139 06/27/2016 1410   K 4.1 07/05/2021 1416   K 4.3 07/22/2017 0756   K 5.1 06/27/2016 1410   CL 101 07/05/2021 1416   CL 105 07/22/2017 0756   CO2 26 07/05/2021 1416   CO2 30 07/22/2017 0756   CO2 28 06/27/2016 1410   BUN 22 07/05/2021 1416   BUN 17 07/22/2017 0756   BUN 20.9 06/27/2016 1410   CREATININE 1.74 (H) 07/05/2021 1416   CREATININE 1.6 (H) 07/22/2017 0756   CREATININE 1.6 (H) 06/27/2016 1410      Component Value Date/Time   CALCIUM 9.3 07/05/2021 1416   CALCIUM 9.6 07/22/2017 0756   CALCIUM 9.6 06/27/2016 1410   ALKPHOS 67 07/05/2021 1416   ALKPHOS 59 07/22/2017 0756   ALKPHOS 62 06/27/2016 1410   AST 21 07/05/2021 1416   AST 18 06/27/2016 1410   ALT 16 07/05/2021 1416   ALT 24 07/22/2017 0756   ALT 24 06/27/2016 1410   BILITOT 0.4 07/05/2021 1416   BILITOT 0.46 06/27/2016 1410      Impression and Plan: Mr. Fregia is 75 year-old African-American gentleman.  He has recurrent CLL.    Am happy that he has has such a great response to treatment.  I had believe that he has but we would consider minimal residual disease.  I would like to try to get him to be negative for MRD.  Again we will try the venetoclax at 100 mg a day.  We will see how he tolerates this.  We will see how his blood counts trend.  I think we can get him back in 6 weeks.  If we have to increase the dose of venetoclax we certainly can do that if his blood counts and performance status will tolerate this.  As always, I have such a wonderful time talking with Mr. Lafontaine.  He is an incredibly interesting guy who has had such great life experiences and I want to make sure that he is able to enjoy what he wishes to  do.  Marland KitchenVolanda Napoleon, MD 8/25/20223:35 PM

## 2021-07-06 ENCOUNTER — Emergency Department (HOSPITAL_BASED_OUTPATIENT_CLINIC_OR_DEPARTMENT_OTHER)
Admission: EM | Admit: 2021-07-06 | Discharge: 2021-07-06 | Disposition: A | Discharge status: Home / Self Care | Payer: Non-veteran care | Attending: Emergency Medicine | Admitting: Emergency Medicine

## 2021-07-06 ENCOUNTER — Other Ambulatory Visit: Payer: Self-pay

## 2021-07-06 ENCOUNTER — Encounter (HOSPITAL_BASED_OUTPATIENT_CLINIC_OR_DEPARTMENT_OTHER): Payer: Self-pay

## 2021-07-06 DIAGNOSIS — R509 Fever, unspecified: Secondary | ICD-10-CM | POA: Insufficient documentation

## 2021-07-06 DIAGNOSIS — Z5321 Procedure and treatment not carried out due to patient leaving prior to being seen by health care provider: Secondary | ICD-10-CM | POA: Insufficient documentation

## 2021-07-06 LAB — KAPPA/LAMBDA LIGHT CHAINS
Kappa free light chain: 22.8 mg/L — ABNORMAL HIGH (ref 3.3–19.4)
Kappa, lambda light chain ratio: 1.64 (ref 0.26–1.65)
Lambda free light chains: 13.9 mg/L (ref 5.7–26.3)

## 2021-07-06 LAB — BETA 2 MICROGLOBULIN, SERUM: Beta-2 Microglobulin: 3.6 mg/L — ABNORMAL HIGH (ref 0.6–2.4)

## 2021-07-06 NOTE — ED Triage Notes (Addendum)
Pt is present for a fever PTA, highest report temp 101. Temp is 98.8 during Triage. Told by oncologist to go to the ER or UC if he has a fever. Pt denies any other sx other than slight nausea and "being tired". Pt stopped chemo drugs yesterday per Oncologist. Medication started to "eat away the good cells" and recently had bone marrow tested. Last taken tylenol this morning.

## 2021-07-07 ENCOUNTER — Encounter (HOSPITAL_BASED_OUTPATIENT_CLINIC_OR_DEPARTMENT_OTHER): Payer: Self-pay

## 2021-07-07 ENCOUNTER — Other Ambulatory Visit: Payer: Self-pay

## 2021-07-07 ENCOUNTER — Emergency Department (HOSPITAL_BASED_OUTPATIENT_CLINIC_OR_DEPARTMENT_OTHER): Payer: No Typology Code available for payment source | Admitting: Radiology

## 2021-07-07 ENCOUNTER — Emergency Department (HOSPITAL_BASED_OUTPATIENT_CLINIC_OR_DEPARTMENT_OTHER)
Admission: EM | Admit: 2021-07-07 | Discharge: 2021-07-07 | Disposition: A | Discharge status: Home / Self Care | Payer: No Typology Code available for payment source | Attending: Emergency Medicine | Admitting: Emergency Medicine

## 2021-07-07 DIAGNOSIS — Z8546 Personal history of malignant neoplasm of prostate: Secondary | ICD-10-CM | POA: Diagnosis not present

## 2021-07-07 DIAGNOSIS — D709 Neutropenia, unspecified: Secondary | ICD-10-CM | POA: Diagnosis not present

## 2021-07-07 DIAGNOSIS — Z20822 Contact with and (suspected) exposure to covid-19: Secondary | ICD-10-CM | POA: Diagnosis not present

## 2021-07-07 DIAGNOSIS — M2550 Pain in unspecified joint: Secondary | ICD-10-CM | POA: Diagnosis not present

## 2021-07-07 DIAGNOSIS — R42 Dizziness and giddiness: Secondary | ICD-10-CM | POA: Insufficient documentation

## 2021-07-07 DIAGNOSIS — R5081 Fever presenting with conditions classified elsewhere: Secondary | ICD-10-CM | POA: Diagnosis not present

## 2021-07-07 DIAGNOSIS — R5383 Other fatigue: Secondary | ICD-10-CM | POA: Diagnosis not present

## 2021-07-07 DIAGNOSIS — Z7901 Long term (current) use of anticoagulants: Secondary | ICD-10-CM | POA: Insufficient documentation

## 2021-07-07 DIAGNOSIS — R0602 Shortness of breath: Secondary | ICD-10-CM | POA: Insufficient documentation

## 2021-07-07 DIAGNOSIS — I480 Paroxysmal atrial fibrillation: Secondary | ICD-10-CM | POA: Insufficient documentation

## 2021-07-07 DIAGNOSIS — R509 Fever, unspecified: Secondary | ICD-10-CM | POA: Diagnosis present

## 2021-07-07 DIAGNOSIS — Z87891 Personal history of nicotine dependence: Secondary | ICD-10-CM | POA: Insufficient documentation

## 2021-07-07 LAB — CBC WITH DIFFERENTIAL/PLATELET
Abs Immature Granulocytes: 0.02 10*3/uL (ref 0.00–0.07)
Basophils Absolute: 0 10*3/uL (ref 0.0–0.1)
Basophils Relative: 0 %
Eosinophils Absolute: 0 10*3/uL (ref 0.0–0.5)
Eosinophils Relative: 0 %
HCT: 34.1 % — ABNORMAL LOW (ref 39.0–52.0)
Hemoglobin: 11.5 g/dL — ABNORMAL LOW (ref 13.0–17.0)
Immature Granulocytes: 1 %
Lymphocytes Relative: 21 %
Lymphs Abs: 0.6 10*3/uL — ABNORMAL LOW (ref 0.7–4.0)
MCH: 32.3 pg (ref 26.0–34.0)
MCHC: 33.7 g/dL (ref 30.0–36.0)
MCV: 95.8 fL (ref 80.0–100.0)
Monocytes Absolute: 0.7 10*3/uL (ref 0.1–1.0)
Monocytes Relative: 25 %
Neutro Abs: 1.3 10*3/uL — ABNORMAL LOW (ref 1.7–7.7)
Neutrophils Relative %: 53 %
Platelets: 102 10*3/uL — ABNORMAL LOW (ref 150–400)
RBC: 3.56 MIL/uL — ABNORMAL LOW (ref 4.22–5.81)
RDW: 13.4 % (ref 11.5–15.5)
WBC: 2.6 10*3/uL — ABNORMAL LOW (ref 4.0–10.5)
nRBC: 0 % (ref 0.0–0.2)

## 2021-07-07 LAB — IGG, IGA, IGM
IgA: 92 mg/dL (ref 61–437)
IgG (Immunoglobin G), Serum: 693 mg/dL (ref 603–1613)
IgM (Immunoglobulin M), Srm: 54 mg/dL (ref 15–143)

## 2021-07-07 LAB — COMPREHENSIVE METABOLIC PANEL
ALT: 16 U/L (ref 0–44)
AST: 22 U/L (ref 15–41)
Albumin: 3.7 g/dL (ref 3.5–5.0)
Alkaline Phosphatase: 55 U/L (ref 38–126)
Anion gap: 8 (ref 5–15)
BUN: 22 mg/dL (ref 8–23)
CO2: 25 mmol/L (ref 22–32)
Calcium: 8.7 mg/dL — ABNORMAL LOW (ref 8.9–10.3)
Chloride: 102 mmol/L (ref 98–111)
Creatinine, Ser: 1.53 mg/dL — ABNORMAL HIGH (ref 0.61–1.24)
GFR, Estimated: 47 mL/min — ABNORMAL LOW (ref >60)
Glucose, Bld: 99 mg/dL (ref 70–99)
Potassium: 3.9 mmol/L (ref 3.5–5.1)
Sodium: 135 mmol/L (ref 135–145)
Total Bilirubin: 0.6 mg/dL (ref 0.3–1.2)
Total Protein: 6.2 g/dL — ABNORMAL LOW (ref 6.5–8.1)

## 2021-07-07 LAB — URINALYSIS, ROUTINE W REFLEX MICROSCOPIC
Bilirubin Urine: NEGATIVE
Glucose, UA: NEGATIVE mg/dL
Ketones, ur: NEGATIVE mg/dL
Leukocytes,Ua: NEGATIVE
Nitrite: NEGATIVE
Protein, ur: NEGATIVE mg/dL
Specific Gravity, Urine: 1.017 (ref 1.005–1.030)
pH: 7 (ref 5.0–8.0)

## 2021-07-07 LAB — PROCALCITONIN: Procalcitonin: <0.1 ng/mL

## 2021-07-07 LAB — RESP PANEL BY RT-PCR (FLU A&B, COVID) ARPGX2
Influenza A by PCR: NEGATIVE
Influenza B by PCR: NEGATIVE
SARS Coronavirus 2 by RT PCR: NEGATIVE

## 2021-07-07 LAB — PROTIME-INR
INR: 2.8 — ABNORMAL HIGH (ref 0.8–1.2)
Prothrombin Time: 29.4 seconds — ABNORMAL HIGH (ref 11.4–15.2)

## 2021-07-07 LAB — LACTIC ACID, PLASMA: Lactic Acid, Venous: 0.5 mmol/L (ref 0.5–1.9)

## 2021-07-07 MED ORDER — CEFDINIR 300 MG PO CAPS: 300.0000 mg | ORAL_CAPSULE | Freq: Two times a day (BID) | ORAL | 0 refills | Status: DC

## 2021-07-07 MED ORDER — LACTATED RINGERS IV SOLN: INTRAVENOUS | Status: DC

## 2021-07-07 NOTE — ED Provider Notes (Signed)
75 yo M with a cc of fever.  CLL on chemo.  I received the patient in signout from Dr. Florina Ou.  Patient fatigued and slightly dizzy.  Has been having fevers off and on since the beginning of August.  Had multiple ED visits.  Neutropenic but not critically so.  Neutropenia today with a total neutrophils of 1.3.  At or slightly above his baseline.  Afebrile here.  Chest x-ray viewed by me without focal infiltrate.  No abdominal pain no urinary symptoms no cough.  We will start again on Omnicef.  Have him follow-up with his PCP and oncologist.   Deno Etienne, DO 07/07/21 [phone]

## 2021-07-07 NOTE — ED Provider Notes (Signed)
[place] DEPT [facility] Provider Note: Georgena Spurling, MD, FACEP  CSN: [id number] MRN: [id number] ARRIVAL: 07/07/21 at [place]: [id number]   CHIEF COMPLAINT  Fever   HISTORY OF PRESENT ILLNESS  07/07/21 5:50 AM [name] is a 75 y.o. male with CLL on [place] and venetoclax therapy.  He was discontinued 07/05/2021 from [facility] but was advised to continue venetoclax.  Bone marrow biopsy 06/22/2021 showed minimal residual CLL.    Yesterday afternoon he developed a fever which has been as high as 101 and was associated with generalized joint aches.  He has been treating his fever with Tylenol.  He is here after awakening about 3 AM with chills and sweats.  He is having generalized fatigue, lightheadedness and shortness of breath but denies nausea, vomiting, diarrhea, chest pain, abdominal pain, nasal congestion, sore throat, cough or dysuria.  His last dose of Tylenol was about 30 minutes prior to arrival.     Past Medical History:  Diagnosis Date   Anticoagulants causing adverse effect in therapeutic use    Anxiety    Paxil seemed to cause odd neuro side effects; better on prozac as of 09/2015   Aortic regurgitation 04/2007 surgery   Resolved with [facility] AVR at [facility]   Cervical spondylosis    ESI by [name] in [place]. 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 (most recent 07/2017)   History of prostate cancer 2003   Rad prost   History of pulmonary embolism 2011; 10/2014   Recurrence off of anticoagulation 10/2014: per hem/onc ([name]) pt needs lifelong anticoagulation (hypercoag w/u neg).   Hyperlipidemia    statin-intolerant, except for crestor low dose.   Hypertension    Migraine syndrome    Mitral regurgitation    PAF (paroxysmal atrial fibrillation) (HCC)    Panic attacks    "           "             "                  "                  "                       "                              "                          Pulmonary nodule, right 2015   RLL: resected at [facility] --VATS; Benign RLL nodule (fungal and AFB stains neg).  Plan is for [facility] thoracic surg to do f/u o/v & CT chest 1 yr    Right-sided headache 07/2016   Primary stabbing HA or migraine per neuro (Dr. Tomi Likens).  Gabapentin and topamax not tolerated.  These resolved spontaneously.    Past Surgical History:  Procedure Laterality Date   AORTIC VALVE REPLACEMENT  2011   [place]  (bioprosthetic)   CARDIAC CATHETERIZATION  04/2010   Normal coronaries.   Carotid dopplers  08/2015   NORMAL   CATARACT EXTRACTION     L 12/06/09   R 09/14/09   COLONOSCOPY  01/26/13   tic's, o/w normal.  ([facility])--recall 10  yrs.   PENILE PROSTHESIS IMPLANT     PROSTATECTOMY  04/2002   for prostate cancer   Pulmonary nodule resection  Fall/winter 2016   [place]     rotator cuff on both arms    TIBIALIS TENDON TRANSFER / REPAIR Right 03/02/2019   Procedure: RECONSTRUCTION OF RIGHT  ANTERIOR TIBIALIS TENDON;  Surgeon: Erle Crocker, MD;  Location: [place];  Service: Orthopedics;  Laterality: Right;   TRANSTHORACIC ECHOCARDIOGRAM  09/20/2014; 03/2016; 03/2017   2015-mild LVH, EF 50-55%, wall motion nl, grade I diast dysfxn, prosth aort valve good,transaortic gradients decreased compared to echo 11/2013.   03/2016: EF 55-60%, normal LV function, severe LVH, normal diast fxn, mild increase in AV gradient compared to 09/2014 echo.  2018: EF 55-60%, normal LV fxn, grd II DD, AV stable/gradient's stable.    Family History  Problem Relation Age of Onset   Cancer Sister        uterine   Colon cancer Sister 73   Stroke Mother    Prostate cancer Father    Stroke Sister    Stroke Brother    Heart attack Neg Hx     Social History   Tobacco Use   Smoking status: Former    Packs/day: 1.00    Years: 12.00    Pack years: 12.00    Types: Cigarettes    Start date: 11/28/1968    Quit date: 01/29/1981     Years since quitting: 40.4   Smokeless tobacco: Never   Tobacco comments:    quit 75 yo   Vaping Use   Vaping Use: Never used  Substance Use Topics   Alcohol use: Not Currently    Comment: occasional   Drug use: No    Prior to Admission medications   Medication Sig Start Date End Date Taking? Authorizing Provider  cefdinir (OMNICEF) 300 MG capsule Take 1 capsule (300 mg total) by mouth 2 (two) times daily for 7 days. 07/07/21 07/14/21 Yes Deno Etienne, DO  amoxicillin (AMOXIL) 500 MG capsule Take 500 mg by mouth 2 (two) times daily. Prior to dental procedures.    [provider]  cholecalciferol (VITAMIN D) 1000 units tablet Take 1,000 Units by mouth daily.    [provider]  [employer] MOTION SICKNESS RELIEF 25 MG CHEW CHEW 1 TABLET EVERY DAY AS NEEDED FOR DIZZINESS 06/06/21   [name], MD  hydrochlorothiazide (HYDRODIURIL) 12.5 MG tablet TAKE 1 TABLET BY MOUTH EVERY DAY 04/04/21   [name], MD  ipratropium (ATROVENT) 0.03 % nasal spray Place 2 sprays into both nostrils every 12 (twelve) hours. 10/04/20   Inda Coke, PA  LORazepam (ATIVAN) 0.5 MG tablet TAKE 1 TABLET BY MOUTH EVERY DAY AS NEEDED FOR ANXIETY 02/26/21   [name], MD  omeprazole (PRILOSEC) 40 MG capsule Take 40 mg by mouth daily. 10/18/20   [provider]  rosuvastatin (CRESTOR) 10 MG tablet TAKE 1 TABLET BY MOUTH EVERY DAY 11/01/20   [name], MD  sertraline (ZOLOFT) 100 MG tablet Take 100 mg by mouth daily.    [provider]  venetoclax (VENCLEXTA) 100 MG tablet Take 1 tablet (100 mg total) by mouth daily. Tablets should be swallowed whole with a meal and a full glass of water. 07/05/21   Volanda Napoleon, MD  warfarin (COUMADIN) 5 MG tablet TAKE AS DIRECTED BY COUMADIN CLINIC 03/19/21   Josue Hector, MD    Allergies Hydrocodone-acetaminophen, Oxycodone  hcl, Telmisartan-hctz, No known allergies, Ramipril, Aspirin, Atorvastatin, Buspirone hcl, Buspirone  hcl, Codeine, Codeine phosphate, Morphine sulfate, Paroxetine, and Rabeprazole   REVIEW OF SYSTEMS  Negative except as noted here or in the History of Present Illness.   PHYSICAL EXAMINATION  Initial Vital Signs Blood pressure (!) 146/89, pulse 82, temperature 99.5 F (37.5 C), temperature source Oral, resp. rate 18, height $RemoveBe'5\' 10"'leoLNDzRY$  (1.778 m), weight 90.7 kg, SpO2 98 %.  Examination General: Well-developed, well-nourished male in no acute distress; appearance consistent with age of record HENT: normocephalic; atraumatic Eyes: pupils equal, round and reactive to light; extraocular muscles intact; bilateral pseudophakia and arcus senilis Neck: supple Heart: regular rate and rhythm Lungs: clear to auscultation bilaterally Abdomen: soft; nondistended; nontender; no masses or hepatosplenomegaly; bowel sounds present Extremities: No deformity; full range of motion; pulses normal Neurologic: Awake, alert and oriented; motor function intact in all extremities and symmetric; no facial droop Skin: Warm and dry Psychiatric: Normal mood and affect   RESULTS  Summary of this visit's results, reviewed and interpreted by myself:   EKG Interpretation  Date/Time:    Ventricular Rate:    PR Interval:    QRS Duration:   QT Interval:    QTC Calculation:   R Axis:     Text Interpretation:          Laboratory Studies: Results for orders placed or performed during the hospital encounter of 07/07/21 (from the past 24 hour(s))  Urinalysis, Routine w reflex microscopic     Status: Abnormal   Collection Time: 07/07/21  7:38 AM  Result Value Ref Range   Color, Urine YELLOW YELLOW   APPearance CLEAR CLEAR   Specific Gravity, Urine 1.017 1.005 - 1.030   pH 7.0 5.0 - 8.0   Glucose, UA NEGATIVE NEGATIVE mg/dL   Hgb urine dipstick TRACE (A) NEGATIVE   Bilirubin Urine NEGATIVE NEGATIVE   Ketones, ur NEGATIVE NEGATIVE mg/dL   Protein, ur NEGATIVE NEGATIVE mg/dL   Nitrite NEGATIVE NEGATIVE    Leukocytes,Ua NEGATIVE NEGATIVE   RBC / HPF 11-20 0 - 5 RBC/hpf   Bacteria, UA RARE (A) NONE SEEN   Squamous Epithelial / LPF 0-5 0 - 5   *Note: Due to a large number of results and/or encounters for the requested time period, some results have not been displayed. A complete set of results can be found in Results Review.   Imaging Studies: DG Chest 2 View  Result Date: 07/07/2021 CLINICAL DATA:  75 year old male with possible sepsis.  Fever.  CLL. EXAM: CHEST - 2 VIEW COMPARISON:  Chest radiographs 06/18/2021 and earlier. FINDINGS: Chronic sternotomy and cardiac valve replacement. Normal cardiac size and mediastinal contours. Visualized tracheal air column is within normal limits. Lung volumes are within normal limits. No pneumothorax, pulmonary edema, pleural effusion or confluent pulmonary opacity. No acute osseous abnormality identified. Negative visible bowel gas pattern. Stable small dystrophic calcifications or retained radiopaque foreign bodies at the right axilla. IMPRESSION: No acute cardiopulmonary abnormality. Electronically Signed   By: Genevie Ann M.D.   On: 07/07/2021 06:48    ED COURSE and MDM  Nursing notes, initial and subsequent vitals signs, including pulse oximetry, reviewed and interpreted by myself.  Vitals:   07/07/21 0600 07/07/21 0645 07/07/21 0700 07/07/21 0730  BP: 138/74 125/78 136/78 131/86  Pulse: 73 69 73 66  Resp: $Remo'17 17 17 14  'oWfrm$ Temp:      TempSrc:      SpO2: 98% 98% 99% 98%  Weight:  Height:       Medications - No data to display   6:02 AM Patient does not meet sepsis criteria but work-up ordered per sepsis order set.  6:55 AM Signed out to Dr. Tyrone Nine. Diagnostic studies pending.   PROCEDURES  Procedures   ED DIAGNOSES     ICD-10-CM   1. Neutropenic fever ([place])  D70.9    R50.81          Kamani Magnussen, Jenny Reichmann, MD 07/08/21 [phone]

## 2021-07-07 NOTE — ED Notes (Signed)
Pt dc home pov,ambulatory. Pt states understanding of dc instructions.

## 2021-07-07 NOTE — ED Triage Notes (Signed)
Reports having fevers and taking tylenol.  Last tylenol around 30 mins pta.  Patient has CLL taking oral chemo.  Highest temp 101. Reports fatigue, lightheadedness sob and denies chest pain.

## 2021-07-07 NOTE — Discharge Instructions (Addendum)
Return for cough, difficulty breathing, if you feel worse.  Call your doctor on Monday and discuss your visit.

## 2021-07-08 ENCOUNTER — Encounter: Payer: Self-pay | Admitting: Family Medicine

## 2021-07-08 ENCOUNTER — Other Ambulatory Visit: Payer: Self-pay

## 2021-07-08 ENCOUNTER — Emergency Department (HOSPITAL_COMMUNITY): Payer: No Typology Code available for payment source

## 2021-07-08 ENCOUNTER — Encounter (HOSPITAL_COMMUNITY): Payer: Self-pay

## 2021-07-08 ENCOUNTER — Telehealth (HOSPITAL_BASED_OUTPATIENT_CLINIC_OR_DEPARTMENT_OTHER): Payer: Self-pay | Admitting: Emergency Medicine

## 2021-07-08 ENCOUNTER — Emergency Department (HOSPITAL_COMMUNITY)
Admission: EM | Admit: 2021-07-08 | Discharge: 2021-07-08 | Disposition: A | Discharge status: Home / Self Care | Payer: No Typology Code available for payment source | Attending: Emergency Medicine | Admitting: Emergency Medicine

## 2021-07-08 DIAGNOSIS — Z87891 Personal history of nicotine dependence: Secondary | ICD-10-CM | POA: Diagnosis not present

## 2021-07-08 DIAGNOSIS — R7881 Bacteremia: Secondary | ICD-10-CM

## 2021-07-08 DIAGNOSIS — Z79899 Other long term (current) drug therapy: Secondary | ICD-10-CM | POA: Diagnosis not present

## 2021-07-08 DIAGNOSIS — N189 Chronic kidney disease, unspecified: Secondary | ICD-10-CM | POA: Insufficient documentation

## 2021-07-08 DIAGNOSIS — R509 Fever, unspecified: Secondary | ICD-10-CM | POA: Diagnosis not present

## 2021-07-08 DIAGNOSIS — I129 Hypertensive chronic kidney disease with stage 1 through stage 4 chronic kidney disease, or unspecified chronic kidney disease: Secondary | ICD-10-CM | POA: Diagnosis not present

## 2021-07-08 DIAGNOSIS — Z8546 Personal history of malignant neoplasm of prostate: Secondary | ICD-10-CM | POA: Insufficient documentation

## 2021-07-08 DIAGNOSIS — Z856 Personal history of leukemia: Secondary | ICD-10-CM | POA: Diagnosis not present

## 2021-07-08 LAB — BLOOD CULTURE ID PANEL (REFLEXED) - BCID2

## 2021-07-08 LAB — MRSA NEXT GEN BY PCR, NASAL: MRSA by PCR Next Gen: NOT DETECTED

## 2021-07-08 MED ORDER — LEVOFLOXACIN 500 MG PO TABS: 500.0000 mg | ORAL_TABLET | Freq: Every day | ORAL | 0 refills | Status: DC

## 2021-07-08 MED ORDER — LEVOFLOXACIN 500 MG PO TABS
500.0000 mg | ORAL_TABLET | Freq: Once | ORAL | Status: AC
  Administered 2021-07-08: 500 mg via ORAL
  Filled 2021-07-08: qty 1

## 2021-07-08 NOTE — ED Triage Notes (Signed)
Pt was seen at [facility] yesterday for fever and fatigue. Pt was called today to go to ER due to positive blood cultures. Pt denies fatigue and nosebleeds today.

## 2021-07-08 NOTE — ED Notes (Signed)
Pt d/c home per MD order. Discharge summary reviewed with pt, pt verbalizes understanding. Ambulatory off unit. No s/s of acute distress noted at discharge.  °

## 2021-07-08 NOTE — ED Provider Notes (Signed)
[name]   CSN: [id number] Arrival date & time: 07/08/21  1135     History Chief Complaint  Patient presents with   Abnormal Lab    [name] is a 75 y.o. male.  75 yo male with hx CLL on chemotherapy to ED for repeat visit 2/2 positive blood cultures x1 with staph species.  Patient with absolute neutrophils 1.3 at recent ER evaluation, similar to baseline/slightly improved.  Patient was recently evaluated at OSH complaint of fever over the past few days.  He was started on Omnicef.  Patient reports T-max at home 101-102.  Been taking Tylenol intermittently.  He has had been having some chills, diaphoretic.  No chest pain, dyspnea, nausea, vomiting.  He is tolerating oral intake without difficulty.  No change in bowel or bladder function.   Patient reports last time he experienced fever was this morning, diaphoretic, chills.  No chest pain, dyspnea, abdominal pain, nausea or vomiting, no rashes.  No recent medication or dietary changes.  No recent travel or sick contacts. He does report his symptoms have been improving since starting oral abx.   The history is provided by the patient. No language interpreter was used.  Abnormal Lab     Past Medical History:  Diagnosis Date   Anticoagulants causing adverse effect in therapeutic use    Anxiety    Paxil seemed to cause odd neuro side effects; better on prozac as of 09/2015   Aortic regurgitation 04/2007 surgery   Resolved [facility]R [facility]   Cervical spondylosis    ESI by [name] in [place]. 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 Marin Bell (most recent 07/2017)   History of prostate cancer 2003   Rad prost   History of pulmonary embolism 2011; 10/2014   Recurrence off of anticoagulation 10/2014: per hem/onc (Dr. Marin Bell) pt needs lifelong anticoagulation (hypercoag w/u neg).   Hyperlipidemia     statin-intolerant, except for crestor low dose.   Hypertension    Migraine syndrome    Mitral regurgitation    PAF (paroxysmal atrial fibrillation) (HCC)    Panic attacks    "           "             "                  "                  "                       "                             "                          Pulmonary nodule, right 2015   RLL: resected at [facility] --VATS; Benign RLL nodule (fungal and AFB stains neg).  Plan is for [facility] thoracic surg to do f/u o/v & CT chest 1 yr    Right-sided headache 07/2016   Primary stabbing HA or migraine per neuro (Dr. Tomi Likens).  Gabapentin and topamax not tolerated.  These resolved spontaneously.    Patient Active Problem List   Diagnosis Date Noted  Anticoagulants causing adverse effect in therapeutic use 06/22/2021   Encounter for therapeutic drug monitoring 09/21/2019   PTSD (post-traumatic stress disorder) 09/29/2018   BPPV (benign paroxysmal positional vertigo) 09/04/2017   CKD (chronic kidney disease), stage III ([place]) 08/11/2017   Former smoker 08/29/2016   Cervical spondylosis    S/P Bioprosthetic AVR (aortic valve replacement) in 2011 03/22/2015   Recurrent pulmonary embolism ([place]) 11/09/2014   Lung nodule 11/02/2014   Chronic lymphocytic leukemia ([place]) 11/11/2013   Migraine 10/08/2010   GERD 12/11/2009   MITRAL REGURGITATION 04/07/2009   Insomnia 02/03/2008   Anxiety state 09/06/2007   HLD (hyperlipidemia) 04/20/2007   Essential hypertension 04/20/2007   Atrial fibrillation ([place]) 04/20/2007   PROSTATE CANCER, HX OF 04/20/2007    Past Surgical History:  Procedure Laterality Date   AORTIC VALVE REPLACEMENT  2011   [place]  (bioprosthetic)   CARDIAC CATHETERIZATION  04/2010   Normal coronaries.   Carotid dopplers  08/2015   NORMAL   CATARACT EXTRACTION     L 12/06/09   R 09/14/09   COLONOSCOPY  01/26/13   tic's, o/w normal.  ([facility])--recall 10 yrs.   PENILE PROSTHESIS IMPLANT     PROSTATECTOMY  04/2002   for prostate  cancer   Pulmonary nodule resection  Fall/winter 2016   [facility]   SHOULDER SURGERY     rotator cuff on both arms    TIBIALIS TENDON TRANSFER / REPAIR Right 03/02/2019   Procedure: RECONSTRUCTION OF RIGHT  ANTERIOR TIBIALIS TENDON;  Surgeon: Erle Crocker, MD;  Location: [place];  Service: Orthopedics;  Laterality: Right;   TRANSTHORACIC ECHOCARDIOGRAM  09/20/2014; 03/2016; 03/2017   2015-mild LVH, EF 50-55%, wall motion nl, grade I diast dysfxn, prosth aort valve good,transaortic gradients decreased compared to echo 11/2013.   03/2016: EF 55-60%, normal LV function, severe LVH, normal diast fxn, mild increase in AV gradient compared to 09/2014 echo.  2018: EF 55-60%, normal LV fxn, grd II DD, AV stable/gradient's stable.       Family History  Problem Relation Age of Onset   Cancer Sister        uterine   Colon cancer Sister 58   Stroke Mother    Prostate cancer Father    Stroke Sister    Stroke Brother    Heart attack Neg Hx     Social History   Tobacco Use   Smoking status: Former    Packs/day: 1.00    Years: 12.00    Pack years: 12.00    Types: Cigarettes    Start date: 11/28/1968    Quit date: 01/29/1981    Years since quitting: 40.4   Smokeless tobacco: Never   Tobacco comments:    quit 75 yo   Vaping Use   Vaping Use: Never used  Substance Use Topics   Alcohol use: Not Currently    Comment: occasional   Drug use: No    Home Medications Prior to Admission medications   Medication Sig Start Date End Date Taking? Authorizing Provider  amoxicillin (AMOXIL) 500 MG capsule Take 500 mg by mouth 2 (two) times daily. Prior to dental procedures.   Yes [provider]  cholecalciferol (VITAMIN D) 1000 units tablet Take 1,000 Units by mouth daily.   Yes [provider]  [employer]S MOTION SICKNESS RELIEF 25 MG CHEW CHEW 1 TABLET EVERY DAY AS NEEDED FOR DIZZINESS Patient taking differently: Chew 1 tablet by mouth daily as needed (vertigo). 06/06/21   Yes [name] Olp,  MD  hydrochlorothiazide (HYDRODIURIL) 12.5 MG tablet TAKE 1 TABLET BY MOUTH EVERY DAY Patient taking differently: Take 12.5 mg by mouth daily. 04/04/21  Yes [name] Olp, MD  ipratropium (ATROVENT) 0.03 % nasal spray Place 2 sprays into both nostrils every 12 (twelve) hours. 10/04/20  Yes Inda Coke, PA  levofloxacin (LEVAQUIN) 500 MG tablet Take 1 tablet (500 mg total) by mouth daily. 07/09/21  Yes Wynona Dove A, DO  LORazepam (ATIVAN) 0.5 MG tablet TAKE 1 TABLET BY MOUTH EVERY DAY AS NEEDED FOR ANXIETY Patient taking differently: Take 0.5 mg by mouth daily as needed for anxiety. 02/26/21  Yes [name] Olp, MD  omeprazole (PRILOSEC) 40 MG capsule Take 40 mg by mouth daily. 10/18/20  Yes [provider]  rosuvastatin (CRESTOR) 10 MG tablet TAKE 1 TABLET BY MOUTH EVERY DAY Patient taking differently: Take 10 mg by mouth daily. 11/01/20  Yes [name] Olp, MD  sertraline (ZOLOFT) 100 MG tablet Take 100 mg by mouth daily.   Yes [provider]  warfarin (COUMADIN) 5 MG tablet TAKE AS DIRECTED BY COUMADIN CLINIC Patient taking differently: Take 5 mg by mouth See admin instructions. '5mg'$  on mondays, 7.'5mg'$ , on all other days. 03/19/21  Yes Josue Hector, MD  venetoclax (VENCLEXTA) 100 MG tablet Take 1 tablet (100 mg total) by mouth daily. Tablets should be swallowed whole with a meal and a full glass of water. 07/05/21   Volanda Napoleon, MD    Allergies    Hydrocodone-acetaminophen, Oxycodone hcl, Telmisartan-hctz, No known allergies, Ramipril, Aspirin, Atorvastatin, Buspirone hcl, Buspirone hcl, Codeine, Codeine phosphate, Morphine sulfate, Paroxetine, and Rabeprazole  Review of Systems   Review of Systems  Constitutional:  Positive for chills, diaphoresis and fever.  HENT:  Negative for facial swelling and trouble swallowing.   Eyes:  Negative for photophobia and visual disturbance.  Respiratory:  Negative for cough and shortness of  breath.   Cardiovascular:  Negative for chest pain and palpitations.  Gastrointestinal:  Negative for abdominal pain, nausea and vomiting.  Endocrine: Negative for polydipsia and polyuria.  Genitourinary:  Negative for difficulty urinating and hematuria.  Musculoskeletal:  Negative for gait problem and joint swelling.  Skin:  Negative for pallor and rash.  Neurological:  Negative for syncope and headaches.  Psychiatric/Behavioral:  Negative for agitation and confusion.    Physical Exam Updated Vital Signs BP 125/74   Pulse 60   Temp 98.1 F (36.7 C) (Oral)   Resp 17   SpO2 100%   Physical Exam Vitals and nursing Bell reviewed.  Constitutional:      General: He is not in acute distress.    Appearance: He is well-developed.  HENT:     Head: Normocephalic and atraumatic.     Right Ear: External ear normal.     Left Ear: External ear normal.     Mouth/Throat:     Mouth: Mucous membranes are moist.  Eyes:     General: No scleral icterus. Cardiovascular:     Rate and Rhythm: Normal rate and regular rhythm.     Pulses: Normal pulses.     Heart sounds: Normal heart sounds.  Pulmonary:     Effort: Pulmonary effort is normal. No respiratory distress.     Breath sounds: Normal breath sounds.  Abdominal:     General: Abdomen is flat.     Palpations: Abdomen is soft.     Tenderness: There is no abdominal tenderness.  Musculoskeletal:  General: Normal range of motion.     Cervical back: Full passive range of motion without pain and normal range of motion.     Right lower leg: No edema.     Left lower leg: No edema.  Skin:    General: Skin is warm and dry.     Capillary Refill: Capillary refill takes less than 2 seconds.  Neurological:     Mental Status: He is alert and oriented to person, place, and time.  Psychiatric:        Mood and Affect: Mood normal.        Behavior: Behavior normal.    ED Results / Procedures / Treatments   Labs (all labs ordered are listed,  but only abnormal results are displayed) Labs Reviewed  MRSA NEXT GEN BY PCR, NASAL    EKG None  Radiology DG Chest 2 View  Result Date: 07/08/2021 CLINICAL DATA:  Fever. EXAM: CHEST - 2 VIEW COMPARISON:  July 07, 2021. FINDINGS: The heart size and mediastinal contours are within normal limits. Both lungs are clear. Status post aortic valve repair. The visualized skeletal structures are unremarkable. IMPRESSION: No active cardiopulmonary disease. Electronically Signed   By: Marijo Conception M.D.   On: 07/08/2021 14:45   DG Chest 2 View  Result Date: 07/07/2021 CLINICAL DATA:  75 year old male with possible sepsis.  Fever.  CLL. EXAM: CHEST - 2 VIEW COMPARISON:  Chest radiographs 06/18/2021 and earlier. FINDINGS: Chronic sternotomy and cardiac valve replacement. Normal cardiac size and mediastinal contours. Visualized tracheal air column is within normal limits. Lung volumes are within normal limits. No pneumothorax, pulmonary edema, pleural effusion or confluent pulmonary opacity. No acute osseous abnormality identified. Negative visible bowel gas pattern. Stable small dystrophic calcifications or retained radiopaque foreign bodies at the right axilla. IMPRESSION: No acute cardiopulmonary abnormality. Electronically Signed   By: Genevie Ann M.D.   On: 07/07/2021 06:48    Procedures Procedures   Medications Ordered in ED Medications  levofloxacin (LEVAQUIN) tablet 500 mg (has no administration in time range)    ED Course  I have reviewed the triage vital signs and the nursing notes.  Pertinent labs & imaging results that were available during my care of the patient were reviewed by me and considered in my medical decision making (see chart for details).    MDM Rules/Calculators/A&P                          75 year old man with history as above presented ER secondary to abnormal lab results.  Pt has been having ongoing fever over the past few days. Tmax PTA 102, afebrile on presentation.   Vital signs reviewed and are stable.  Previous laboratory evaluation was reviewed and is stable.  Serious etiology considered.   Patient has no complaints other than fever.  Obvious source infection blood cultures with staph species.  Started on [place] by outside facility.  Patient does not want to stay in the hospital and is requesting trial outpatient therapy.  Is reasonable given baseline labs and improving symptoms. Stable presentation. Discussed with oncology, recommends switching to Levaquin for 5 days.  Follow-up with oncology in the next 24 to 48 hours.  Message sent to patient's primary oncologist Dr [name].    Will send MRSA swab, chest x-ray.  Started on Levaquin.  Chest x-ray stable    The patient improved significantly and was discharged in stable condition. Detailed discussions were had with the patient  regarding current findings, and need for close f/u with PCP or on call doctor. The patient has been instructed to return immediately if the symptoms worsen in any way for re-evaluation. Patient verbalized understanding and is in agreement with current care plan. All questions answered prior to discharge.  Advised patient return to ED for any worsening worrisome symptoms.  If unable to see oncology please return to emergency department.   Final Clinical Impression(s) / ED Diagnoses Final diagnoses:  Fever, unspecified fever cause  Bacteremia    Rx / DC Orders ED Discharge Orders          Ordered    levofloxacin (LEVAQUIN) 500 MG tablet  Daily        07/08/21 Mascotte, Atilla A, DO 07/08/21 1518

## 2021-07-09 ENCOUNTER — Other Ambulatory Visit: Payer: Self-pay

## 2021-07-09 ENCOUNTER — Inpatient Hospital Stay (HOSPITAL_BASED_OUTPATIENT_CLINIC_OR_DEPARTMENT_OTHER)
Admission: EM | Admit: 2021-07-09 | Discharge: 2021-07-13 | DRG: 871 | Disposition: A | Discharge status: Home / Self Care | Payer: No Typology Code available for payment source | Attending: Family Medicine | Admitting: Family Medicine

## 2021-07-09 ENCOUNTER — Emergency Department (HOSPITAL_BASED_OUTPATIENT_CLINIC_OR_DEPARTMENT_OTHER)
Admission: EM | Admit: 2021-07-09 | Discharge: 2021-07-09 | Disposition: A | Discharge status: Home / Self Care | Payer: No Typology Code available for payment source | Attending: Emergency Medicine | Admitting: Emergency Medicine

## 2021-07-09 ENCOUNTER — Encounter (HOSPITAL_BASED_OUTPATIENT_CLINIC_OR_DEPARTMENT_OTHER): Payer: Self-pay | Admitting: *Deleted

## 2021-07-09 ENCOUNTER — Encounter: Payer: Self-pay | Admitting: Family Medicine

## 2021-07-09 DIAGNOSIS — Z885 Allergy status to narcotic agent status: Secondary | ICD-10-CM | POA: Diagnosis not present

## 2021-07-09 DIAGNOSIS — Z20822 Contact with and (suspected) exposure to covid-19: Secondary | ICD-10-CM | POA: Diagnosis present

## 2021-07-09 DIAGNOSIS — R7881 Bacteremia: Secondary | ICD-10-CM | POA: Diagnosis present

## 2021-07-09 DIAGNOSIS — B957 Other staphylococcus as the cause of diseases classified elsewhere: Secondary | ICD-10-CM | POA: Diagnosis present

## 2021-07-09 DIAGNOSIS — I340 Nonrheumatic mitral (valve) insufficiency: Secondary | ICD-10-CM | POA: Diagnosis not present

## 2021-07-09 DIAGNOSIS — F410 Panic disorder [episodic paroxysmal anxiety] without agoraphobia: Secondary | ICD-10-CM | POA: Diagnosis present

## 2021-07-09 DIAGNOSIS — C9110 Chronic lymphocytic leukemia of B-cell type not having achieved remission: Secondary | ICD-10-CM | POA: Diagnosis present

## 2021-07-09 DIAGNOSIS — Z823 Family history of stroke: Secondary | ICD-10-CM

## 2021-07-09 DIAGNOSIS — Z86711 Personal history of pulmonary embolism: Secondary | ICD-10-CM | POA: Diagnosis not present

## 2021-07-09 DIAGNOSIS — F32A Depression, unspecified: Secondary | ICD-10-CM | POA: Diagnosis present

## 2021-07-09 DIAGNOSIS — D709 Neutropenia, unspecified: Secondary | ICD-10-CM | POA: Diagnosis present

## 2021-07-09 DIAGNOSIS — Z9842 Cataract extraction status, left eye: Secondary | ICD-10-CM | POA: Diagnosis not present

## 2021-07-09 DIAGNOSIS — Z952 Presence of prosthetic heart valve: Secondary | ICD-10-CM

## 2021-07-09 DIAGNOSIS — N183 Chronic kidney disease, stage 3 unspecified: Secondary | ICD-10-CM | POA: Diagnosis not present

## 2021-07-09 DIAGNOSIS — Z8546 Personal history of malignant neoplasm of prostate: Secondary | ICD-10-CM | POA: Diagnosis not present

## 2021-07-09 DIAGNOSIS — R5081 Fever presenting with conditions classified elsewhere: Secondary | ICD-10-CM | POA: Diagnosis present

## 2021-07-09 DIAGNOSIS — Z87891 Personal history of nicotine dependence: Secondary | ICD-10-CM | POA: Diagnosis not present

## 2021-07-09 DIAGNOSIS — I480 Paroxysmal atrial fibrillation: Secondary | ICD-10-CM | POA: Diagnosis present

## 2021-07-09 DIAGNOSIS — Z8042 Family history of malignant neoplasm of prostate: Secondary | ICD-10-CM

## 2021-07-09 DIAGNOSIS — Z7901 Long term (current) use of anticoagulants: Secondary | ICD-10-CM

## 2021-07-09 DIAGNOSIS — E785 Hyperlipidemia, unspecified: Secondary | ICD-10-CM | POA: Diagnosis present

## 2021-07-09 DIAGNOSIS — R509 Fever, unspecified: Secondary | ICD-10-CM

## 2021-07-09 DIAGNOSIS — Z9079 Acquired absence of other genital organ(s): Secondary | ICD-10-CM

## 2021-07-09 DIAGNOSIS — N1832 Chronic kidney disease, stage 3b: Secondary | ICD-10-CM | POA: Diagnosis present

## 2021-07-09 DIAGNOSIS — Z9841 Cataract extraction status, right eye: Secondary | ICD-10-CM | POA: Diagnosis not present

## 2021-07-09 DIAGNOSIS — J189 Pneumonia, unspecified organism: Secondary | ICD-10-CM | POA: Diagnosis present

## 2021-07-09 DIAGNOSIS — N179 Acute kidney failure, unspecified: Secondary | ICD-10-CM | POA: Diagnosis present

## 2021-07-09 DIAGNOSIS — I10 Essential (primary) hypertension: Secondary | ICD-10-CM | POA: Diagnosis not present

## 2021-07-09 DIAGNOSIS — I129 Hypertensive chronic kidney disease with stage 1 through stage 4 chronic kidney disease, or unspecified chronic kidney disease: Secondary | ICD-10-CM | POA: Diagnosis present

## 2021-07-09 DIAGNOSIS — Z5321 Procedure and treatment not carried out due to patient leaving prior to being seen by health care provider: Secondary | ICD-10-CM | POA: Diagnosis not present

## 2021-07-09 DIAGNOSIS — Z800 Family history of malignant neoplasm of digestive organs: Secondary | ICD-10-CM

## 2021-07-09 DIAGNOSIS — N1831 Chronic kidney disease, stage 3a: Secondary | ICD-10-CM | POA: Diagnosis present

## 2021-07-09 DIAGNOSIS — D61818 Other pancytopenia: Secondary | ICD-10-CM

## 2021-07-09 DIAGNOSIS — Z86718 Personal history of other venous thrombosis and embolism: Secondary | ICD-10-CM

## 2021-07-09 DIAGNOSIS — B958 Unspecified staphylococcus as the cause of diseases classified elsewhere: Secondary | ICD-10-CM | POA: Diagnosis not present

## 2021-07-09 DIAGNOSIS — Z79899 Other long term (current) drug therapy: Secondary | ICD-10-CM

## 2021-07-09 DIAGNOSIS — I350 Nonrheumatic aortic (valve) stenosis: Secondary | ICD-10-CM | POA: Diagnosis not present

## 2021-07-09 LAB — CBC WITH DIFFERENTIAL/PLATELET
Abs Immature Granulocytes: 0.01 10*3/uL (ref 0.00–0.07)
Basophils Absolute: 0 10*3/uL (ref 0.0–0.1)
Basophils Relative: 0 %
Eosinophils Absolute: 0 10*3/uL (ref 0.0–0.5)
Eosinophils Relative: 0 %
HCT: 33.7 % — ABNORMAL LOW (ref 39.0–52.0)
Hemoglobin: 11.1 g/dL — ABNORMAL LOW (ref 13.0–17.0)
Immature Granulocytes: 1 %
Lymphocytes Relative: 17 %
Lymphs Abs: 0.3 10*3/uL — ABNORMAL LOW (ref 0.7–4.0)
MCH: 31.6 pg (ref 26.0–34.0)
MCHC: 32.9 g/dL (ref 30.0–36.0)
MCV: 96 fL (ref 80.0–100.0)
Monocytes Absolute: 0.5 10*3/uL (ref 0.1–1.0)
Monocytes Relative: 26 %
Neutro Abs: 1.1 10*3/uL — ABNORMAL LOW (ref 1.7–7.7)
Neutrophils Relative %: 56 %
Platelets: 76 10*3/uL — ABNORMAL LOW (ref 150–400)
RBC: 3.51 MIL/uL — ABNORMAL LOW (ref 4.22–5.81)
RDW: 13.5 % (ref 11.5–15.5)
WBC: 1.9 10*3/uL — ABNORMAL LOW (ref 4.0–10.5)
nRBC: 0 % (ref 0.0–0.2)

## 2021-07-09 LAB — LACTIC ACID, PLASMA: Lactic Acid, Venous: 0.8 mmol/L (ref 0.5–1.9)

## 2021-07-09 LAB — PROTIME-INR
INR: 2.8 — ABNORMAL HIGH (ref 0.8–1.2)
Prothrombin Time: 29.3 seconds — ABNORMAL HIGH (ref 11.4–15.2)

## 2021-07-09 LAB — URINALYSIS, ROUTINE W REFLEX MICROSCOPIC
Bilirubin Urine: NEGATIVE
Cellular Cast, UA: 1
Glucose, UA: NEGATIVE mg/dL
Ketones, ur: NEGATIVE mg/dL
Leukocytes,Ua: NEGATIVE
Nitrite: NEGATIVE
Specific Gravity, Urine: 1.022 (ref 1.005–1.030)
pH: 6.5 (ref 5.0–8.0)

## 2021-07-09 LAB — PROTEIN ELECTROPHORESIS, SERUM, WITH REFLEX
A/G Ratio: 1.7 (ref 0.7–1.7)
Albumin ELP: 3.7 g/dL (ref 2.9–4.4)
Alpha-1-Globulin: 0.2 g/dL (ref 0.0–0.4)
Alpha-2-Globulin: 0.5 g/dL (ref 0.4–1.0)
Beta Globulin: 0.9 g/dL (ref 0.7–1.3)
Gamma Globulin: 0.6 g/dL (ref 0.4–1.8)
Globulin, Total: 2.2 g/dL (ref 2.2–3.9)
Total Protein ELP: 5.9 g/dL — ABNORMAL LOW (ref 6.0–8.5)

## 2021-07-09 LAB — RESP PANEL BY RT-PCR (FLU A&B, COVID) ARPGX2
Influenza A by PCR: NEGATIVE
Influenza B by PCR: NEGATIVE
SARS Coronavirus 2 by RT PCR: NEGATIVE

## 2021-07-09 LAB — BASIC METABOLIC PANEL
Anion gap: 9 (ref 5–15)
BUN: 28 mg/dL — ABNORMAL HIGH (ref 8–23)
CO2: 25 mmol/L (ref 22–32)
Calcium: 8.8 mg/dL — ABNORMAL LOW (ref 8.9–10.3)
Chloride: 100 mmol/L (ref 98–111)
Creatinine, Ser: 1.99 mg/dL — ABNORMAL HIGH (ref 0.61–1.24)
GFR, Estimated: 34 mL/min — ABNORMAL LOW (ref >60)
Glucose, Bld: 114 mg/dL — ABNORMAL HIGH (ref 70–99)
Potassium: 3.7 mmol/L (ref 3.5–5.1)
Sodium: 134 mmol/L — ABNORMAL LOW (ref 135–145)

## 2021-07-09 LAB — PROCALCITONIN: Procalcitonin: 0.15 ng/mL

## 2021-07-09 MED ORDER — ACETAMINOPHEN 500 MG PO TABS
1000.0000 mg | ORAL_TABLET | Freq: Once | ORAL | Status: AC
  Administered 2021-07-09: 1000 mg via ORAL
  Filled 2021-07-09: qty 2

## 2021-07-09 MED ORDER — LORAZEPAM 1 MG PO TABS
0.5000 mg | ORAL_TABLET | Freq: Once | ORAL | Status: AC
  Administered 2021-07-09: 0.5 mg via ORAL
  Filled 2021-07-09: qty 1

## 2021-07-09 MED ORDER — ONDANSETRON HCL 4 MG/2ML IJ SOLN: 4.0000 mg | Freq: Four times a day (QID) | INTRAMUSCULAR | Status: DC | PRN

## 2021-07-09 MED ORDER — VANCOMYCIN HCL 1750 MG/350ML IV SOLN
1750.0000 mg | Freq: Once | INTRAVENOUS | Status: DC
  Filled 2021-07-09: qty 350

## 2021-07-09 MED ORDER — ROSUVASTATIN CALCIUM 10 MG PO TABS
10.0000 mg | ORAL_TABLET | Freq: Every day | ORAL | Status: DC
  Administered 2021-07-09 – 2021-07-12 (×4): 10 mg via ORAL
  Filled 2021-07-09 (×4): qty 1

## 2021-07-09 MED ORDER — BISACODYL 5 MG PO TBEC: 5.0000 mg | DELAYED_RELEASE_TABLET | Freq: Every day | ORAL | Status: DC | PRN

## 2021-07-09 MED ORDER — IPRATROPIUM BROMIDE 0.03 % NA SOLN
2.0000 | Freq: Two times a day (BID) | NASAL | Status: DC | PRN
  Filled 2021-07-09: qty 30

## 2021-07-09 MED ORDER — SODIUM CHLORIDE 0.9 % IV BOLUS: 1000.0000 mL | Freq: Once | INTRAVENOUS | Status: DC

## 2021-07-09 MED ORDER — ACETAMINOPHEN 650 MG RE SUPP: 650.0000 mg | Freq: Four times a day (QID) | RECTAL | Status: DC | PRN

## 2021-07-09 MED ORDER — CEFAZOLIN SODIUM-DEXTROSE 2-4 GM/100ML-% IV SOLN
2.0000 g | Freq: Three times a day (TID) | INTRAVENOUS | Status: DC
  Administered 2021-07-09 – 2021-07-10 (×4): 2 g via INTRAVENOUS
  Filled 2021-07-09 (×5): qty 100

## 2021-07-09 MED ORDER — METOPROLOL TARTRATE 5 MG/5ML IV SOLN: 5.0000 mg | Freq: Four times a day (QID) | INTRAVENOUS | Status: DC | PRN

## 2021-07-09 MED ORDER — SODIUM CHLORIDE 0.9 % IV BOLUS
500.0000 mL | Freq: Once | INTRAVENOUS | Status: AC
  Administered 2021-07-09: 500 mL via INTRAVENOUS

## 2021-07-09 MED ORDER — ONDANSETRON HCL 4 MG PO TABS: 4.0000 mg | ORAL_TABLET | Freq: Four times a day (QID) | ORAL | Status: DC | PRN

## 2021-07-09 MED ORDER — WARFARIN - PHARMACIST DOSING INPATIENT: Freq: Every day | Status: DC

## 2021-07-09 MED ORDER — WARFARIN SODIUM 2.5 MG PO TABS
2.5000 mg | ORAL_TABLET | Freq: Once | ORAL | Status: AC
  Administered 2021-07-09: 2.5 mg via ORAL
  Filled 2021-07-09: qty 1

## 2021-07-09 MED ORDER — VENETOCLAX 100 MG PO TABS
100.0000 mg | ORAL_TABLET | Freq: Every day | ORAL | Status: DC
Start: 2021-07-10 — End: 2021-07-09

## 2021-07-09 MED ORDER — ACETAMINOPHEN 325 MG PO TABS
650.0000 mg | ORAL_TABLET | Freq: Four times a day (QID) | ORAL | Status: DC | PRN
  Administered 2021-07-09 – 2021-07-11 (×4): 650 mg via ORAL
  Filled 2021-07-09 (×5): qty 2

## 2021-07-09 MED ORDER — SERTRALINE HCL 100 MG PO TABS
100.0000 mg | ORAL_TABLET | Freq: Every morning | ORAL | Status: DC
  Administered 2021-07-10 – 2021-07-12 (×3): 100 mg via ORAL
  Filled 2021-07-09 (×3): qty 1

## 2021-07-09 MED ORDER — VITAMIN D 25 MCG (1000 UNIT) PO TABS
2000.0000 [IU] | ORAL_TABLET | Freq: Every morning | ORAL | Status: DC
  Administered 2021-07-10 – 2021-07-12 (×3): 2000 [IU] via ORAL
  Filled 2021-07-09 (×3): qty 2

## 2021-07-09 MED ORDER — SENNOSIDES-DOCUSATE SODIUM 8.6-50 MG PO TABS: 1.0000 | ORAL_TABLET | Freq: Every evening | ORAL | Status: DC | PRN

## 2021-07-09 MED ORDER — OMEPRAZOLE 20 MG PO CPDR
40.0000 mg | DELAYED_RELEASE_CAPSULE | Freq: Every evening | ORAL | Status: DC
  Administered 2021-07-09 – 2021-07-12 (×4): 40 mg via ORAL
  Filled 2021-07-09 (×5): qty 2

## 2021-07-09 MED ORDER — MECLIZINE HCL 25 MG PO TABS
25.0000 mg | ORAL_TABLET | Freq: Every morning | ORAL | Status: DC
  Administered 2021-07-10 – 2021-07-12 (×3): 25 mg via ORAL
  Filled 2021-07-09 (×3): qty 1

## 2021-07-09 NOTE — ED Triage Notes (Signed)
Pt returned to the ED for possible admission. Pt c/o fever that has been ongoing. Was seen at [facility] yesterday for positive blood cultures and was placed on levaquin. Pt states he woke up this morning at 2am with fever of 103. Pt. Took tylenol. Denies any urinary symptoms or sob. C/o of feeling dizzy at times when he is walking. Denies dizziness at present. Pt has CLL and on chemo

## 2021-07-09 NOTE — ED Notes (Signed)
Pt states that he needs to leave and get his cell phone before his workup begins. Pt states he does not have anyone who can help him to get the phone. I encouraged pt to stay and not leave to get this phone. I asked Dr. Dina Rich to speak with pt.

## 2021-07-09 NOTE — H&P (Signed)
History and Physical    [name] NOB:[id number] DOB: [birth year] DOA: 07/09/2021  PCP: [name], MD  Patient coming from: Home  I have personally briefly reviewed patient's old medical records in [facility].  Chief Complaint: fevers. Called back due to positive blood culture.  HPI: [name] is a 75 y.o. male with medical history significant for CLL not currently in remission, pancytopenia, history of pulmonary emboli related to travel and now on chronic warfarin, history of aortic valve repair, who presents to the emergency department on 07/09/2021 with fevers and a positive blood culture.  Patient's original issue was that he had a nosebleed with  and chills with fever 101 at home on 07/07/21; he did present to the emergency department, had cultures drawn, and was started on oral antibiotics. He was told to come to the emergency department 07/08/21 due to finding of a positive blood culture; he came to the emergency department and improved and was sent home on oral levofloxacin. Symptoms are alleviated by nothing and exacerbated by nothing. Around 2 am on 07/09/21 he developed a cold sweat, chills, then burning; temperature was 103 at home. He took Tylenol at home. Associated symptoms: He is a former Scientist, product/process development and usually has excellent balance but for the last month he has had worsening balance. No shortness of breath, cough, wheezing, chest pain, palpitations, abdominal pain, vomiting, diarrhea, bloody stool, dysuria, or hematuria. Occasional tension headaches over the last week; they are located on the right side usually behind his right eye, 3/10 "airy" pain; it is alleviated by nothing and exacerbated by nothing.    ED Course: Patient continued to have pancytopenia. He was febrile. Another set of blood cultures were drawn.  ED physician discussed the case with the pharmacist who recommended Ancef, which was started in the emergency department.   History:  CLL  was in remission until 02/2021. He developed dizziness and room spinning. Then started medication again.  Had a PE and DVT from travel around 2012; had it 2 separate times.  Xarelto in past.   Most recent dose warfarin was 1.5 tabets on 07/08/21. Is supposed to have 1 tablet on 8/29.  Echocardiogram, 12/13/2020: IMPRESSIONS  1. Left ventricular ejection fraction, by estimation, is 55 to 60%. The  left ventricle has normal function. The left ventricle has no regional  wall motion abnormalities. Left ventricular diastolic parameters are  consistent with Grade II diastolic dysfunction (pseudonormalization).   2. Right ventricular systolic function is normal. The right ventricular  size is normal.   3. The mitral valve is normal in structure. Trivial mitral valve  regurgitation. No evidence of mitral stenosis.   4. Bioprosthetic aortic valve. Mean gradient 30 mmHg with calculated AVA 1.09 cm^2 suggestive of moderate bioprosthetic valve stenosis. No significant regurgitation.   5. Aortic dilatation noted. There is mild dilatation of the aortic root,  measuring 41 mm.   6. Right atrial size was mildly dilated.   7. Left atrial size was mildly dilated.   8. IVC not visualized. Peak RV-RA gradient 24 mmHg.  Review of Systems: As per HPI otherwise all other systems reviewed and are unremarable.  No new joint pain or joint swelling.     Past Medical History:  Diagnosis Date   Anticoagulants causing adverse effect in therapeutic use    Anxiety    Paxil seemed to cause odd neuro side effects; better on prozac as of 09/2015   Aortic regurgitation 04/2007 surgery   Resolved  with tissue AVR at [facility]   Cervical spondylosis    ESI by [name] in [place]. 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 (most recent 07/2017)   History of prostate cancer 2003   Rad prost   History of pulmonary embolism 2011; 10/2014    Recurrence off of anticoagulation 10/2014: per hem/onc ([name]) pt needs lifelong anticoagulation (hypercoag w/u neg).   Hyperlipidemia    statin-intolerant, except for crestor low dose.   Hypertension    Migraine syndrome    Mitral regurgitation    PAF (paroxysmal atrial fibrillation) (HCC)    Panic attacks    "           "             "                  "                  "                       "                             "                          Pulmonary nodule, right 2015   RLL: resected at [facility] --VATS; Benign RLL nodule (fungal and AFB stains neg).  Plan is for [facility] thoracic surg to do f/u o/v & CT chest 1 yr    Right-sided headache 07/2016   Primary stabbing HA or migraine per neuro (Dr. Tomi Likens).  Gabapentin and topamax not tolerated.  These resolved spontaneously.    Past Surgical History:  Procedure Laterality Date   AORTIC VALVE REPLACEMENT  2011   [place]  (bioprosthetic)   CARDIAC CATHETERIZATION  04/2010   Normal coronaries.   Carotid dopplers  08/2015   NORMAL   CATARACT EXTRACTION     L 12/06/09   R 09/14/09   COLONOSCOPY  01/26/13   tic's, o/w normal.  ([facility])--recall 10 yrs.   PENILE PROSTHESIS IMPLANT     PROSTATECTOMY  04/2002   for prostate cancer   Pulmonary nodule resection  Fall/winter 2016   [facility]   SHOULDER SURGERY     rotator cuff on both arms    TIBIALIS TENDON TRANSFER / REPAIR Right 03/02/2019   Procedure: RECONSTRUCTION OF RIGHT  ANTERIOR TIBIALIS TENDON;  Surgeon: Erle Crocker, MD;  Location: [place];  Service: Orthopedics;  Laterality: Right;   TRANSTHORACIC ECHOCARDIOGRAM  09/20/2014; 03/2016; 03/2017   2015-mild LVH, EF 50-55%, wall motion nl, grade I diast dysfxn, prosth aort valve good,transaortic gradients decreased compared to echo 11/2013.   03/2016: EF 55-60%, normal LV function, severe LVH, normal diast fxn, mild increase in AV gradient compared to 09/2014 echo.  2018: EF 55-60%, normal LV fxn, grd II DD, AV  stable/gradient's stable.    Social History  reports that he quit smoking about 40 years ago. His smoking use included cigarettes. He started smoking about 52 years ago. He has a 12.00 pack-year smoking history. He has never used smokeless tobacco. He reports that he does not currently use alcohol. He reports that he does not use drugs.  Allergies  Allergen Reactions  Hydrocodone-Acetaminophen Shortness Of Breath   Oxycodone Hcl Shortness Of Breath   Telmisartan-Hctz Shortness Of Breath, Palpitations and Other (See Comments)    Dizziness also   Ramipril Other (See Comments)    Unknown reaction   Aspirin Rash and Other (See Comments)    Confusion also Cannot take high dose   Atorvastatin Other (See Comments)    myalgia   Buspirone Hcl Other (See Comments)    headache   Buspirone Hcl Other (See Comments)    headache   Codeine Hives, Nausea And Vomiting and Other (See Comments)    Confusion also   Morphine Sulfate Itching   Paroxetine Other (See Comments)    Paresthesias: R arm, R leg, r side of face   Rabeprazole Other (See Comments)    headache    Family History  Problem Relation Age of Onset   Cancer Sister        uterine   Colon cancer Sister 57   Stroke Mother    Prostate cancer Father    Stroke Sister    Stroke Brother    Heart attack Neg Hx      Home Medications  Prior to Admission medications   Medication Sig Start Date End Date Taking? Authorizing Provider  amoxicillin (AMOXIL) 500 MG capsule Take 2,000 mg by mouth See admin instructions. Take 4 capsules (2000 mg) by mouth one hour prior to dental appointments   Yes [provider]  Cholecalciferol (VITAMIN D3) 50 MCG (2000 UT) capsule Take 2,000 Units by mouth every morning.   Yes [provider]  [employer] MOTION SICKNESS RELIEF 25 MG CHEW CHEW 1 TABLET EVERY DAY AS NEEDED FOR DIZZINESS Patient taking differently: Chew 25 mg by mouth every morning. meclizine 06/06/21  Yes [name], MD   docusate sodium (STOOL SOFTENER) 100 MG capsule Take 100 mg by mouth daily as needed (constipation).   Yes [provider]  hydrochlorothiazide (HYDRODIURIL) 12.5 MG tablet TAKE 1 TABLET BY MOUTH EVERY DAY Patient taking differently: Take 12.5 mg by mouth every morning. 04/04/21  Yes [name], MD  ipratropium (ATROVENT) 0.03 % nasal spray Place 2 sprays into both nostrils every 12 (twelve) hours. Patient taking differently: Place 2 sprays into both nostrils 2 (two) times daily as needed for rhinitis (congestion). 10/04/20  Yes Worley, Aldona Bar, PA  LORazepam (ATIVAN) 0.5 MG tablet TAKE 1 TABLET BY MOUTH EVERY DAY AS NEEDED FOR ANXIETY Patient taking differently: Take 0.125 mg by mouth at bedtime as needed for anxiety. 1/4 tablet - 0.125 mg 02/26/21  Yes [name], MD  omeprazole (PRILOSEC) 40 MG capsule Take 40 mg by mouth every evening. 10/18/20  Yes [provider]  rosuvastatin (CRESTOR) 10 MG tablet TAKE 1 TABLET BY MOUTH EVERY DAY Patient taking differently: Take 10 mg by mouth at bedtime. 11/01/20  Yes [name], MD  sertraline (ZOLOFT) 100 MG tablet Take 100 mg by mouth every morning.   Yes [provider]  venetoclax (VENCLEXTA) 100 MG tablet Take 1 tablet (100 mg total) by mouth daily. Tablets should be swallowed whole with a meal and a full glass of water. Patient taking differently: Take 100 mg by mouth at bedtime. Tablets should be swallowed whole with a meal and a full glass of water. 07/05/21  Yes Volanda Napoleon, MD  warfarin (COUMADIN) 5 MG tablet TAKE AS DIRECTED BY COUMADIN CLINIC Patient taking differently: Take 5-7.5 mg by mouth See admin instructions. Take 1 tablet (5 mg) by  mouth on Monday evenings, take 1 1/2 tablets (7.5 mg) on all other evenings of the week 03/19/21  Yes Josue Hector, MD  levofloxacin (LEVAQUIN) 500 MG tablet Take 1 tablet (500 mg total) by mouth daily. 07/09/21   Jeanell Sparrow, DO    Physical  Exam: Vitals:   07/09/21 1300 07/09/21 1400 07/09/21 1415 07/09/21 1515  BP: 122/70 138/74  118/60  Pulse: 92 89 89 85  Resp: 15 (!) $Remo'23 20 20  'FuJYY$ Temp: (!) 101.6 F (38.7 C)   99.3 F (37.4 C)  TempSrc: Oral   Oral  SpO2: 96% 95% 96% 95%  Weight:      Height:        Constitutional: NAD, calm, comfortable, ill-appearing. Vitals:   07/09/21 1300 07/09/21 1400 07/09/21 1415 07/09/21 1515  BP: 122/70 138/74  118/60  Pulse: 92 89 89 85  Resp: 15 (!) $Remo'23 20 20  'QuMSh$ Temp: (!) 101.6 F (38.7 C)   99.3 F (37.4 C)  TempSrc: Oral   Oral  SpO2: 96% 95% 96% 95%  Weight:      Height:       Eyes: Pupils equal and round, lids and conjunctivae without icterus or erythema. ENMT: Mucous membranes are dry. Posterior pharynx clear of any exudate or lesions. Nares patent without discharge or bleeding.  Normocephalic, atraumatic.  Normal dentition.  Neck: normal, supple, no masses, trachea midline.  Thyroid nontender, no masses appreciated, no thyromegaly. Respiratory: clear to auscultation bilaterally. Chest wall movements are symmetric. No wheezing, no crackles.  No rhonchi.  Normal respiratory effort. No accessory muscle use.  Cardiovascular: Normal S1, S2. Murmur 3/6 systolic. No rubs / gallops. Pulses: Radial and DP pulses 2+ bilaterally. No carotid bruits.  Capillary refill less than 3 seconds. Edema: None bilaterally. GI: soft, non-distended, normal active bowel sounds. No hepatosplenomegaly. No rigidity, rebound, or guarding. Non-tender. No masses palpated.  Musculoskeletal: no clubbing / cyanosis. No joint deformity upper and lower extremities. Good ROM, no contractures. Normal muscle tone.  No tenderness or deformity in the back bilaterally. Integument: no rashes, lesions, ulcers. No induration. Clean, dry, intact. Neurologic: CN 2-12 grossly intact. Sensation grossly intact to light touch. DTR 2+ bilaterally.  Babinski: Toes downgoing bilaterally.  Strength 5/5 in all 4.  Intact rapid alternating  movements bilaterally.  No pronator drift. Psychiatric: Normal judgment and insight. Alert and oriented x 3. Normal mood.  Normal and appropriate affect. Lymphatic: No cervical lymphadenopathy. No supraclavicular lymphadenopathy.   Labs on Admission: I have personally reviewed the following labs and imaging studies.  CBC: Recent Labs  Lab 07/05/21 1416 07/07/21 0605 07/09/21 0708  WBC 2.7* 2.6* 1.9*  NEUTROABS 1.3* 1.3* 1.1*  HGB 11.3* 11.5* 11.1*  HCT 34.0* 34.1* 33.7*  MCV 97.1 95.8 96.0  PLT 142* 102* 76*    Basic Metabolic Panel: Recent Labs  Lab 07/05/21 1416 07/07/21 0605 07/09/21 0708  NA 136 135 134*  K 4.1 3.9 3.7  CL 101 102 100  CO2 $Re'26 25 25  'fMM$ GLUCOSE 124* 99 114*  BUN 22 22 28*  CREATININE 1.74* 1.53* 1.99*  CALCIUM 9.3 8.7* 8.8*    GFR: Estimated Creatinine Clearance: 33.1 mL/min (A) (by C-G formula based on SCr of 1.99 mg/dL (H)).  Liver Function Tests: Recent Labs  Lab 07/05/21 1416 07/07/21 0605  AST 21 22  ALT 16 16  ALKPHOS 67 55  BILITOT 0.4 0.6  PROT 6.5 6.2*  ALBUMIN 4.0 3.7    Urine analysis:  Component Value Date/Time   COLORURINE YELLOW 07/09/2021 0647   APPEARANCEUR CLEAR 07/09/2021 0647   LABSPEC 1.022 07/09/2021 0647   PHURINE 6.5 07/09/2021 0647   GLUCOSEU NEGATIVE 07/09/2021 0647   GLUCOSEU NEGATIVE 08/11/2017 0843   HGBUR SMALL (A) 07/09/2021 0647   HGBUR negative 03/20/2010 0843   BILIRUBINUR NEGATIVE 07/09/2021 0647   BILIRUBINUR n 03/22/2011 0000   KETONESUR NEGATIVE 07/09/2021 0647   PROTEINUR TRACE (A) 07/09/2021 0647   UROBILINOGEN 0.2 08/11/2017 0843   NITRITE NEGATIVE 07/09/2021 0647   LEUKOCYTESUR NEGATIVE 07/09/2021 0647    Radiological Exams on Admission: DG Chest 2 View  Result Date: 07/08/2021 CLINICAL DATA:  Fever. EXAM: CHEST - 2 VIEW COMPARISON:  July 07, 2021. FINDINGS: The heart size and mediastinal contours are within normal limits. Both lungs are clear. Status post aortic valve repair. The  visualized skeletal structures are unremarkable. IMPRESSION: No active cardiopulmonary disease. Electronically Signed   By: Marijo Conception M.D.   On: 07/08/2021 14:45    EKG: Independently reviewed.  71 bpm.  Sinus rhythm.  RBBB.  LAFB.   Assessment/Plan Principal Problem:   Bacteremia Active Problems:   Chronic lymphocytic leukemia (HCC)   Essential hypertension   PROSTATE CANCER, HX OF   S/P Bioprosthetic AVR (aortic valve replacement) in 2011   CKD (chronic kidney disease), stage III (HCC)   History of pulmonary embolism   Principal Problem:   Bacteremia With recurrent fevers.  Failed outpatient management.  Plan: Additional blood cultures drawn.  Empiric IV Ancef per pharmacy recommendation.  Active Problems:   Chronic lymphocytic leukemia (HCC) Not in remission.  Plan: Continue follow-up with oncology.    CKD (chronic kidney disease), stage III (HCC) Chronic kidney disease likely due to hypertension. Baseline creatinine 2022 appears to be one-point 5-2.  Admission creatinine within his usual range. Plan: Avoid nephrotoxins.  Outpatient follow-up.    History of pulmonary embolism Plan: Warfarin, pharmacy to dose.    Essential hypertension Plan: Home medications.    S/P Bioprosthetic AVR (aortic valve replacement) in 2011 Noted.  Plan: Routine outpatient follow-up.    PROSTATE CANCER, HX OF Noted.  Plan: Routine follow-up.    DVT prophylaxis: Warfarin Code Status:   Full Code   Disposition Plan:   Patient is from:  Home  Anticipated DC to:  Home  Anticipated DC date:  07/18/2021  Anticipated DC barriers: Failed outpatient management with oral antibiotics and need for prolonged IV antibiotics.  Consults called:  None.  Admission status:  Inpatient.   Severity of Illness: The appropriate patient status for this patient is INPATIENT. Inpatient status is judged to be reasonable and necessary in order to provide the required intensity of service to ensure the  patient's safety. The patient's presenting symptoms, physical exam findings, and initial radiographic and laboratory data in the context of their chronic comorbidities is felt to place them at high risk for further clinical deterioration. Furthermore, it is not anticipated that the patient will be medically stable for discharge from the hospital within 2 midnights of admission. The following factors support the patient status of inpatient.   " The patient's presenting symptoms include recurrent fever and chills. " The worrisome physical exam findings include heart murmur. " The initial radiographic and laboratory data are worrisome because of concern for bacteremia. " The chronic co-morbidities include CLL with pancytopenia.   * I certify that at the point of admission it is my clinical judgment that the patient will require inpatient hospital care spanning  beyond 2 midnights from the point of admission due to high intensity of service, high risk for further deterioration and high frequency of surveillance required.Tacey Ruiz MD [facility]  How to contact the [facility] Attending or Consulting provider [place] or covering provider during after hours [place], for this patient?   Check the care team in [facility] and look for a) attending/consulting [facility] provider listed and b) the [facility] team listed Log into [email or web address] and use [place]'s universal password to access. If you do not have the password, please contact the hospital operator. Locate the [facility] provider you are looking for under [facility] and page to a number that you can be directly reached. If you still have difficulty reaching the provider, please page the [facility] (Director on Call) for the Hospitalists listed on amion for assistance.  07/09/2021, 5:09 PM

## 2021-07-09 NOTE — ED Notes (Signed)
Dr. Roslynn Amble in with pt

## 2021-07-09 NOTE — ED Notes (Signed)
Paged hospitalist via [place]

## 2021-07-09 NOTE — ED Provider Notes (Signed)
MSE was initiated and I personally evaluated the patient and placed orders (if any) at  5:00 AM on July 09, 2021.  The patient appears stable so that the remainder of the MSE may be completed by another provider.  MSE completed during triage process.  Patient with recurrent fevers at home.  History of cancer.  Was seen and evaluated on Saturday morning.  At that time ultimately had a blood culture come back positive for staph.  He had been on Omnicef.  He returned to [facility] long hospital yesterday afternoon.  At that time his oncologist was consulted.  He just returned because of the positive blood cultures.  Recommendation was to transition to Levaquin.  Patient preferred outpatient trial of antibiotic.  He did receive 1 dose of Levaquin.  Patient reports that he woke up around 2 AM this morning sweating and chilled.  He had a fever to 103.  He called the number on his discharge instructions and was told to be seen in the ED.  At time of triage, patient is stating that he needs to go get his phone.  He does not have any contacts and does not know any of his numbers.  He does not have anyone who can get his phone for him.  Temperature right now 100.3.  He is awake, alert, oriented.  He is not ill-appearing.  His vital signs are otherwise reassuring.  I discussed with patient that my formal recommendation would be that he stay for evaluation although anticipate that may result in admission to the hospital making getting his cell phone more difficult unless he has someone to bring it to him.  Patient is requesting that he go get his cell phone and return for full evaluation.  He understands that during that time he may deteriorate or worsen and he accepts that risk.   Merryl Hacker, MD 07/09/21 [phone]

## 2021-07-09 NOTE — Progress Notes (Signed)
Pharmacy Antibiotic Note  [name] is a 75 y.o. male admitted on 07/09/2021 with fever, bacteremia, previous blood cultures grew Staph Hominis in 1/2 bottles.  Pharmacy has been consulted for Vancomycin dosing.  He was started on Ancef at [facility] ED prior to transfer.    WBC low at 1.9 Tm 101.6 Procalcitonin < 0.1 on 8/27, increased to 0.15 on 8/29, but is affected by renal function.    Plan: Vancomycin 1750 mg IV x1 then 1000 mg IV q24h.   Measure Vanc levels as needed.  Goal AUC = 400 - 550 Follow up renal function, culture results, and clinical course.   Height: '5\' 10"'$  (177.8 cm) Weight: 87.5 kg (193 lb) IBW/kg (Calculated) : 73  Temp (24hrs), Avg:99.9 F (37.7 C), Min:98.2 F (36.8 C), Max:101.6 F (38.7 C)  Recent Labs  Lab 07/05/21 1416 07/07/21 0605 07/09/21 0708 07/09/21 0900  WBC 2.7* 2.6* 1.9*  --   CREATININE 1.74* 1.53* 1.99*  --   LATICACIDVEN  --  0.5  --  0.8    Estimated Creatinine Clearance: 33.1 mL/min (A) (by C-G formula based on SCr of 1.99 mg/dL (H)).    Allergies  Allergen Reactions   Hydrocodone-Acetaminophen Shortness Of Breath   Oxycodone Hcl Shortness Of Breath   Telmisartan-Hctz Shortness Of Breath, Palpitations and Other (See Comments)    Dizziness also   Ramipril Other (See Comments)    Unknown reaction   Aspirin Rash and Other (See Comments)    Confusion also Cannot take high dose   Atorvastatin Other (See Comments)    myalgia   Buspirone Hcl Other (See Comments)    headache   Buspirone Hcl Other (See Comments)    headache   Codeine Hives, Nausea And Vomiting and Other (See Comments)    Confusion also   Morphine Sulfate Itching   Paroxetine Other (See Comments)    Paresthesias: R arm, R leg, r side of face   Rabeprazole Other (See Comments)    headache    Antimicrobials this admission: 8/29 Cefazolin >>  8/29 Vancomycin >>   Dose adjustments this admission:   Microbiology results: 8/8 Resp panel: Covid neg,  influenza neg 8/8 BCx: NGF 8/27 Resp panel: Covid neg, influenza neg 8/27 BCx: 1/2 bottles Staphylococcus Hominis 8/27 BCID:  Staphylococcus species 8/28 MRSA PCR: not detected 8/29 UCx:  8/29 BCx: 8/29 Resp panel: Covid neg, influenza neg  Thank you for allowing pharmacy to be a part of this patient's care.  Gretta Arab PharmD, BCPS Clinical Pharmacist [facility] main pharmacy [phone] 07/09/2021 5:06 PM

## 2021-07-09 NOTE — ED Triage Notes (Addendum)
Pt c/o fever that has been ongoing. Was seen at [facility] yesterday for positive blood cultures and was placed on levaquin. Pt states he woke up this morning at 2am with fever of 103. Pt. Took tylenol. Denies any urinary symptoms or sob. C/o of feeling dizzy at times when he is walking. Denies dizziness at present. Pt has CLL and on chemo

## 2021-07-09 NOTE — ED Notes (Signed)
[facility] has received report

## 2021-07-09 NOTE — ED Provider Notes (Signed)
[facility] EMERGENCY DEPT Provider Note   CSN: [id number] Arrival date & time: 07/09/21  0554     History Chief Complaint  Patient presents with   Fever    [name] is a 75 y.o. male.  Presented to the emergency room with concern for fever.  Patient has history of CLL, neutropenia, s/p AVR, pulmonary embolism on Coumadin.  Presents to ER with concern for fever.  Has been having daily fevers for the last few days.  This morning woke up with fever around 103.  Was seen in the emergency department originally on 8/27 for fever.  Blood culture sent, patient discharged on [place].  Blood culture came back positive.  Patient came back to the emergency room but ultimately requested discharge.  Discharged with course of Levaquin.  Patient states he does not have any other symptoms except for feeling feverish and chills.  Currently feels fine.  Specifically denies cough.  No headache or neck stiffness.  No GI complaints.  No dysuria or hematuria.  HPI     Past Medical History:  Diagnosis Date   Anticoagulants causing adverse effect in therapeutic use    Anxiety    Paxil seemed to cause odd neuro side effects; better on prozac as of 09/2015   Aortic regurgitation 04/2007 surgery   Resolved with tissue AVR at [facility]   Cervical spondylosis    ESI by [name] in [place]. 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 (most recent 07/2017)   History of prostate cancer 2003   Rad prost   History of pulmonary embolism 2011; 10/2014   Recurrence off of anticoagulation 10/2014: per hem/onc ([name]) pt needs lifelong anticoagulation (hypercoag w/u neg).   Hyperlipidemia    statin-intolerant, except for crestor low dose.   Hypertension    Migraine syndrome    Mitral regurgitation    PAF (paroxysmal atrial fibrillation) (HCC)    Panic attacks    "           "             "                  "                   "                       "                             "                          Pulmonary nodule, right 2015   RLL: resected at [facility] --VATS; Benign RLL nodule (fungal and AFB stains neg).  Plan is for [facility] thoracic surg to do f/u o/v & CT chest 1 yr    Right-sided headache 07/2016   Primary stabbing HA or migraine per neuro (Dr. Tomi Likens).  Gabapentin and topamax not tolerated.  These resolved spontaneously.    Patient Active Problem List   Diagnosis Date Noted   Anticoagulants causing adverse effect in therapeutic use 06/22/2021   Encounter for therapeutic drug monitoring 09/21/2019   PTSD (post-traumatic stress disorder) 09/29/2018   BPPV (benign paroxysmal positional vertigo) 09/04/2017   CKD (chronic kidney  disease), stage III ([place]) 08/11/2017   Former smoker 08/29/2016   Cervical spondylosis    S/P Bioprosthetic AVR (aortic valve replacement) in 2011 03/22/2015   Recurrent pulmonary embolism ([place]) 11/09/2014   Lung nodule 11/02/2014   Chronic lymphocytic leukemia ([place]) 11/11/2013   Migraine 10/08/2010   GERD 12/11/2009   MITRAL REGURGITATION 04/07/2009   Insomnia 02/03/2008   Anxiety state 09/06/2007   HLD (hyperlipidemia) 04/20/2007   Essential hypertension 04/20/2007   Atrial fibrillation ([place]) 04/20/2007   PROSTATE CANCER, HX OF 04/20/2007    Past Surgical History:  Procedure Laterality Date   AORTIC VALVE REPLACEMENT  2011   [place]  (bioprosthetic)   CARDIAC CATHETERIZATION  04/2010   Normal coronaries.   Carotid dopplers  08/2015   NORMAL   CATARACT EXTRACTION     L 12/06/09   R 09/14/09   COLONOSCOPY  01/26/13   tic's, o/w normal.  ([place])--recall 10 yrs.   PENILE PROSTHESIS IMPLANT     PROSTATECTOMY  04/2002   for prostate cancer   Pulmonary nodule resection  Fall/winter 2016   [facility]   SHOULDER SURGERY     rotator cuff on both arms    TIBIALIS TENDON TRANSFER / REPAIR Right 03/02/2019   Procedure: RECONSTRUCTION OF RIGHT  ANTERIOR TIBIALIS TENDON;   Surgeon: Erle Crocker, MD;  Location: [facility];  Service: Orthopedics;  Laterality: Right;   TRANSTHORACIC ECHOCARDIOGRAM  09/20/2014; 03/2016; 03/2017   2015-mild LVH, EF 50-55%, wall motion nl, grade I diast dysfxn, prosth aort valve good,transaortic gradients decreased compared to echo 11/2013.   03/2016: EF 55-60%, normal LV function, severe LVH, normal diast fxn, mild increase in AV gradient compared to 09/2014 echo.  2018: EF 55-60%, normal LV fxn, grd II DD, AV stable/gradient's stable.       Family History  Problem Relation Age of Onset   Cancer Sister        uterine   Colon cancer Sister 32   Stroke Mother    Prostate cancer Father    Stroke Sister    Stroke Brother    Heart attack Neg Hx     Social History   Tobacco Use   Smoking status: Former    Packs/day: 1.00    Years: 12.00    Pack years: 12.00    Types: Cigarettes    Start date: 11/28/1968    Quit date: 01/29/1981    Years since quitting: 40.4   Smokeless tobacco: Never   Tobacco comments:    quit 75 yo   Vaping Use   Vaping Use: Never used  Substance Use Topics   Alcohol use: Not Currently    Comment: occasional   Drug use: No    Home Medications Prior to Admission medications   Medication Sig Start Date End Date Taking? Authorizing Provider  amoxicillin (AMOXIL) 500 MG capsule Take 500 mg by mouth 2 (two) times daily. Prior to dental procedures.    [provider]  cholecalciferol (VITAMIN D) 1000 units tablet Take 1,000 Units by mouth daily.    [provider]  [employer] MOTION SICKNESS RELIEF 25 MG CHEW CHEW 1 TABLET EVERY DAY AS NEEDED FOR DIZZINESS Patient taking differently: Chew 1 tablet by mouth daily as needed (vertigo). 06/06/21   [name], MD  hydrochlorothiazide (HYDRODIURIL) 12.5 MG tablet TAKE 1 TABLET BY MOUTH EVERY DAY Patient taking differently: Take 12.5 mg by mouth daily. 04/04/21   [name], MD  ipratropium (ATROVENT) 0.03 % nasal  spray Place 2 sprays into both nostrils every 12 (twelve) hours. 10/04/20   Inda Coke, PA  levofloxacin (LEVAQUIN) 500 MG tablet Take 1 tablet (500 mg total) by mouth daily. 07/09/21   Jeanell Sparrow, DO  LORazepam (ATIVAN) 0.5 MG tablet TAKE 1 TABLET BY MOUTH EVERY DAY AS NEEDED FOR ANXIETY Patient taking differently: Take 0.5 mg by mouth daily as needed for anxiety. 02/26/21   [name], MD  omeprazole (PRILOSEC) 40 MG capsule Take 40 mg by mouth daily. 10/18/20   [provider]  rosuvastatin (CRESTOR) 10 MG tablet TAKE 1 TABLET BY MOUTH EVERY DAY Patient taking differently: Take 10 mg by mouth daily. 11/01/20   [name], MD  sertraline (ZOLOFT) 100 MG tablet Take 100 mg by mouth daily.    [provider]  venetoclax (VENCLEXTA) 100 MG tablet Take 1 tablet (100 mg total) by mouth daily. Tablets should be swallowed whole with a meal and a full glass of water. 07/05/21   Volanda Napoleon, MD  warfarin (COUMADIN) 5 MG tablet TAKE AS DIRECTED BY COUMADIN CLINIC Patient taking differently: Take 5 mg by mouth See admin instructions. '5mg'$  on mondays, 7.'5mg'$ , on all other days. 03/19/21   Josue Hector, MD    Allergies    Hydrocodone-acetaminophen, Oxycodone hcl, Telmisartan-hctz, No known allergies, Ramipril, Aspirin, Atorvastatin, Buspirone hcl, Buspirone hcl, Codeine, Codeine phosphate, Morphine sulfate, Paroxetine, and Rabeprazole  Review of Systems   Review of Systems  Constitutional:  Positive for chills and fever.  HENT:  Negative for ear pain and sore throat.   Eyes:  Negative for pain and visual disturbance.  Respiratory:  Negative for cough and shortness of breath.   Cardiovascular:  Negative for chest pain and palpitations.  Gastrointestinal:  Negative for abdominal pain and vomiting.  Genitourinary:  Negative for dysuria and hematuria.  Musculoskeletal:  Negative for arthralgias and back pain.  Skin:  Negative for color change and rash.   Neurological:  Negative for seizures and syncope.  All other systems reviewed and are negative.  Physical Exam Updated Vital Signs BP (!) 92/52 (BP Location: Right Arm)   Pulse 71   Temp 98.2 F (36.8 C)   Resp (!) 24   Ht '5\' 10"'$  (1.778 m)   Wt 87.5 kg   SpO2 99%   BMI 27.69 kg/m   Physical Exam Vitals and nursing note reviewed.  Constitutional:      Appearance: He is well-developed.  HENT:     Head: Normocephalic and atraumatic.  Eyes:     Conjunctiva/sclera: Conjunctivae normal.  Cardiovascular:     Rate and Rhythm: Normal rate and regular rhythm.     Heart sounds: No murmur heard. Pulmonary:     Effort: Pulmonary effort is normal. No respiratory distress.     Breath sounds: Normal breath sounds.  Abdominal:     Palpations: Abdomen is soft.     Tenderness: There is no abdominal tenderness.  Musculoskeletal:     Cervical back: Neck supple.  Skin:    General: Skin is warm and dry.  Neurological:     General: No focal deficit present.     Mental Status: He is alert.  Psychiatric:        Mood and Affect: Mood normal.        Behavior: Behavior normal.    ED Results / Procedures / Treatments   Labs (all labs ordered are listed, but only abnormal results are displayed) Labs Reviewed  CBC WITH  DIFFERENTIAL/PLATELET - Abnormal; Notable for the following components:      Result Value   WBC 1.9 (*)    RBC 3.51 (*)    Hemoglobin 11.1 (*)    HCT 33.7 (*)    Platelets 76 (*)    Neutro Abs 1.1 (*)    Lymphs Abs 0.3 (*)    All other components within normal limits  BASIC METABOLIC PANEL - Abnormal; Notable for the following components:   Sodium 134 (*)    Glucose, Bld 114 (*)    BUN 28 (*)    Creatinine, Ser 1.99 (*)    Calcium 8.8 (*)    GFR, Estimated 34 (*)    All other components within normal limits  PROTIME-INR - Abnormal; Notable for the following components:   Prothrombin Time 29.3 (*)    INR 2.8 (*)    All other components within normal limits   CULTURE, BLOOD (ROUTINE X 2)  CULTURE, BLOOD (ROUTINE X 2)  RESP PANEL BY RT-PCR (FLU A&B, COVID) ARPGX2  URINE CULTURE  PROCALCITONIN  URINALYSIS, ROUTINE W REFLEX MICROSCOPIC  LACTIC ACID, PLASMA    EKG None  Radiology DG Chest 2 View  Result Date: 07/08/2021 CLINICAL DATA:  Fever. EXAM: CHEST - 2 VIEW COMPARISON:  July 07, 2021. FINDINGS: The heart size and mediastinal contours are within normal limits. Both lungs are clear. Status post aortic valve repair. The visualized skeletal structures are unremarkable. IMPRESSION: No active cardiopulmonary disease. Electronically Signed   By: Marijo Conception M.D.   On: 07/08/2021 14:45    Procedures .Critical Care  Date/Time: 07/09/2021 8:40 AM Performed by: Lucrezia Starch, MD Authorized by: Lucrezia Starch, MD   Critical care provider statement:    Critical care time (minutes):  40   Critical care was necessary to treat or prevent imminent or life-threatening deterioration of the following conditions:  Sepsis   Critical care was time spent personally by me on the following activities:  Discussions with consultants, evaluation of patient's response to treatment, examination of patient, ordering and performing treatments and interventions, ordering and review of laboratory studies, ordering and review of radiographic studies, pulse oximetry, re-evaluation of patient's condition, obtaining history from patient or surrogate and review of old charts   Medications Ordered in ED Medications  ceFAZolin (ANCEF) IVPB 2g/100 mL premix (0 g Intravenous Stopped 07/09/21 0813)  sodium chloride 0.9 % bolus 500 mL (500 mLs Intravenous New Bag/Given 07/09/21 0807)    ED Course  I have reviewed the triage vital signs and the nursing notes.  Pertinent labs & imaging results that were available during my care of the patient were reviewed by me and considered in my medical decision making (see chart for details).    MDM Rules/Calculators/A&P                             75 year old male with multiple medical issues including CLL, neutropenia presenting to ER with concern for recurrent fever.  Recent blood cultures positive.  Staph species.  Discussed with pharmacist, recommended Ancef for now.  We will repeat cultures, check basic blood work.  Patient still has degree of neutropenia, ANC 1.1.  Creatinine slightly more elevated than prior.  Discussed case with [facility] hospitalist, will admit for further management.  Patient is nontoxic-appearing and has stable vitals at present.  Final Clinical Impression(s) / ED Diagnoses Final diagnoses:  Bacteremia  Neutropenic fever ([place])    Rx /  DC Orders ED Discharge Orders     None        Lucrezia Starch, MD 07/09/21 [phone]

## 2021-07-09 NOTE — Progress Notes (Signed)
ANTICOAGULATION CONSULT NOTE - Initial Consult  Pharmacy Consult for Warfarin Indication: Hx PE, AVR  Allergies  Allergen Reactions   Hydrocodone-Acetaminophen Shortness Of Breath   Oxycodone Hcl Shortness Of Breath   Telmisartan-Hctz Shortness Of Breath, Palpitations and Other (See Comments)    Dizziness also   Ramipril Other (See Comments)    Unknown reaction   Aspirin Rash and Other (See Comments)    Confusion also Cannot take high dose   Atorvastatin Other (See Comments)    myalgia   Buspirone Hcl Other (See Comments)    headache   Buspirone Hcl Other (See Comments)    headache   Codeine Hives, Nausea And Vomiting and Other (See Comments)    Confusion also   Morphine Sulfate Itching   Paroxetine Other (See Comments)    Paresthesias: R arm, R leg, r side of face   Rabeprazole Other (See Comments)    headache    Patient Measurements: Height: '5\' 10"'$  (177.8 cm) Weight: 87.5 kg (193 lb) IBW/kg (Calculated) : 73 Heparin Dosing Weight:  Vital Signs: Temp: 99.3 F (37.4 C) (08/29 1515) Temp Source: Oral (08/29 1515) BP: 118/60 (08/29 1515) Pulse Rate: 85 (08/29 1515)  Labs: Recent Labs    07/07/21 0605 07/09/21 0708  HGB 11.5* 11.1*  HCT 34.1* 33.7*  PLT 102* 76*  LABPROT 29.4* 29.3*  INR 2.8* 2.8*  CREATININE 1.53* 1.99*    Estimated Creatinine Clearance: 33.1 mL/min (A) (by C-G formula based on SCr of 1.99 mg/dL (H)).   Medical History: Past Medical History:  Diagnosis Date   Anticoagulants causing adverse effect in therapeutic use    Anxiety    Paxil seemed to cause odd neuro side effects; better on prozac as of 09/2015   Aortic regurgitation 04/2007 surgery   Resolved with [facility]R at [facility]   Cervical spondylosis    ESI by [name] in [place]. 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 (most recent 07/2017)   History of prostate cancer 2003   Rad prost   History  of pulmonary embolism 2011; 10/2014   Recurrence off of anticoagulation 10/2014: per hem/onc ([name]) pt needs lifelong anticoagulation (hypercoag w/u neg).   Hyperlipidemia    statin-intolerant, except for crestor low dose.   Hypertension    Migraine syndrome    Mitral regurgitation    PAF (paroxysmal atrial fibrillation) (HCC)    Panic attacks    "           "             "                  "                  "                       "                             "                          Pulmonary nodule, right 2015   RLL: resected at [facility] --VATS; Benign RLL nodule (fungal and AFB stains neg).  Plan is for [facility] thoracic surg to do f/u o/v & CT  chest 1 yr    Right-sided headache 07/2016   Primary stabbing HA or migraine per neuro (Dr. Tomi Likens).  Gabapentin and topamax not tolerated.  These resolved spontaneously.    Medications:  Scheduled:  Infusions:    ceFAZolin (ANCEF) IV Stopped (07/09/21 1422)    Assessment: 25 yoM admitted on 8/29 for fevers, neutropenia, hx of CLL.  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  Pharmacy is consulted to resume warfarin PTA Warfarin 7.5 mg daily except '5mg'$  on Mondays.  Last dose taken on 8/28 at 1830.   Admit INR 2.8 CBC:  Hgb 11.1 (baseline ~11.5-12), Plt 76 k (baseline 76-142k) Drug-drug interactions: PTA Venetoclax may increase the serum concentration of Warfarin Diet: regular  Goal of Therapy:  INR 2-3 Monitor platelets by anticoagulation protocol: Yes   Plan:  Warfarin 2.5 mg PO x 1. Daily PT/INR. Monitor for signs and symptoms of bleeding.   Gretta Arab PharmD, BCPS Clinical Pharmacist [facility] main pharmacy [phone] 07/09/2021 5:30 PM

## 2021-07-09 NOTE — ED Notes (Signed)
Pt given medication, repositioned. Pt aware he has a bed at [facility]

## 2021-07-09 NOTE — ED Notes (Signed)
Assessment: Pt returned due to fever, pt seen yesterday at [facility] and elected not to be admitted, due to fever 103 oral he elected to come back for admission.

## 2021-07-10 ENCOUNTER — Inpatient Hospital Stay (HOSPITAL_COMMUNITY): Payer: No Typology Code available for payment source

## 2021-07-10 DIAGNOSIS — N183 Chronic kidney disease, stage 3 unspecified: Secondary | ICD-10-CM | POA: Diagnosis not present

## 2021-07-10 DIAGNOSIS — C9110 Chronic lymphocytic leukemia of B-cell type not having achieved remission: Secondary | ICD-10-CM

## 2021-07-10 DIAGNOSIS — B958 Unspecified staphylococcus as the cause of diseases classified elsewhere: Secondary | ICD-10-CM

## 2021-07-10 DIAGNOSIS — Z952 Presence of prosthetic heart valve: Secondary | ICD-10-CM

## 2021-07-10 DIAGNOSIS — Z86711 Personal history of pulmonary embolism: Secondary | ICD-10-CM | POA: Diagnosis not present

## 2021-07-10 DIAGNOSIS — D709 Neutropenia, unspecified: Secondary | ICD-10-CM

## 2021-07-10 DIAGNOSIS — R5081 Fever presenting with conditions classified elsewhere: Secondary | ICD-10-CM

## 2021-07-10 DIAGNOSIS — D61818 Other pancytopenia: Secondary | ICD-10-CM

## 2021-07-10 DIAGNOSIS — Z8546 Personal history of malignant neoplasm of prostate: Secondary | ICD-10-CM

## 2021-07-10 DIAGNOSIS — R7881 Bacteremia: Secondary | ICD-10-CM

## 2021-07-10 DIAGNOSIS — R509 Fever, unspecified: Secondary | ICD-10-CM | POA: Diagnosis not present

## 2021-07-10 LAB — ECHOCARDIOGRAM COMPLETE
AR max vel: 1.07 cm2
AV Area VTI: 0.99 cm2
AV Area mean vel: 1.46 cm2
AV Mean grad: 46.5 mmHg
AV Peak grad: 76.9 mmHg
Ao pk vel: 4.38 m/s
Area-P 1/2: 3.61 cm2
Height: 70 in
S' Lateral: 3.5 cm
Single Plane A4C EF: 67.7 %
Weight: 3088 oz

## 2021-07-10 LAB — URINE CULTURE: Culture: NO GROWTH

## 2021-07-10 LAB — CBC
HCT: 34 % — ABNORMAL LOW (ref 39.0–52.0)
Hemoglobin: 11 g/dL — ABNORMAL LOW (ref 13.0–17.0)
MCH: 32.1 pg (ref 26.0–34.0)
MCHC: 32.4 g/dL (ref 30.0–36.0)
MCV: 99.1 fL (ref 80.0–100.0)
Platelets: 77 10*3/uL — ABNORMAL LOW (ref 150–400)
RBC: 3.43 MIL/uL — ABNORMAL LOW (ref 4.22–5.81)
RDW: 13.7 % (ref 11.5–15.5)
WBC: 1.8 10*3/uL — ABNORMAL LOW (ref 4.0–10.5)
nRBC: 0 % (ref 0.0–0.2)

## 2021-07-10 LAB — BASIC METABOLIC PANEL
Anion gap: 8 (ref 5–15)
BUN: 22 mg/dL (ref 8–23)
CO2: 25 mmol/L (ref 22–32)
Calcium: 8.8 mg/dL — ABNORMAL LOW (ref 8.9–10.3)
Chloride: 102 mmol/L (ref 98–111)
Creatinine, Ser: 1.47 mg/dL — ABNORMAL HIGH (ref 0.61–1.24)
GFR, Estimated: 49 mL/min — ABNORMAL LOW (ref >60)
Glucose, Bld: 105 mg/dL — ABNORMAL HIGH (ref 70–99)
Potassium: 3.6 mmol/L (ref 3.5–5.1)
Sodium: 135 mmol/L (ref 135–145)

## 2021-07-10 LAB — HEPATITIS PANEL, ACUTE
HCV Ab: NONREACTIVE
Hep A IgM: NONREACTIVE
Hep B C IgM: NONREACTIVE
Hepatitis B Surface Ag: NONREACTIVE

## 2021-07-10 LAB — PROTIME-INR
INR: 2.3 — ABNORMAL HIGH (ref 0.8–1.2)
Prothrombin Time: 25.3 seconds — ABNORMAL HIGH (ref 11.4–15.2)

## 2021-07-10 LAB — HIV ANTIBODY (ROUTINE TESTING W REFLEX): HIV Screen 4th Generation wRfx: NONREACTIVE

## 2021-07-10 LAB — PROCALCITONIN: Procalcitonin: 0.15 ng/mL

## 2021-07-10 MED ORDER — HYDROCHLOROTHIAZIDE 12.5 MG PO CAPS
12.5000 mg | ORAL_CAPSULE | Freq: Every day | ORAL | Status: DC
  Administered 2021-07-10 – 2021-07-12 (×3): 12.5 mg via ORAL
  Filled 2021-07-10 (×3): qty 1

## 2021-07-10 MED ORDER — LORAZEPAM 0.5 MG PO TABS
0.5000 mg | ORAL_TABLET | Freq: Every evening | ORAL | Status: DC | PRN
  Administered 2021-07-11 – 2021-07-12 (×2): 0.5 mg via ORAL
  Filled 2021-07-10 (×2): qty 1

## 2021-07-10 MED ORDER — FILGRASTIM 480 MCG/1.6ML IJ SOLN
480.0000 ug | Freq: Every day | INTRAMUSCULAR | Status: DC
  Administered 2021-07-10: 480 ug via SUBCUTANEOUS
  Filled 2021-07-10 (×2): qty 1.6

## 2021-07-10 MED ORDER — WARFARIN SODIUM 5 MG PO TABS: 5.0000 mg | ORAL_TABLET | Freq: Once | ORAL | Status: DC

## 2021-07-10 MED ORDER — IOHEXOL 9 MG/ML PO SOLN
500.0000 mL | ORAL | Status: AC
Start: 2021-07-10 — End: 2021-07-10
  Administered 2021-07-10 (×2): 500 mL via ORAL

## 2021-07-10 MED ORDER — TRAMADOL HCL 50 MG PO TABS: 50.0000 mg | ORAL_TABLET | Freq: Four times a day (QID) | ORAL | Status: DC | PRN

## 2021-07-10 MED ORDER — HEPARIN (PORCINE) 25000 UT/250ML-% IV SOLN
1300.0000 [IU]/h | INTRAVENOUS | Status: DC
  Filled 2021-07-10: qty 250

## 2021-07-10 MED ORDER — IOHEXOL 350 MG/ML SOLN
80.0000 mL | Freq: Once | INTRAVENOUS | Status: AC | PRN
  Administered 2021-07-10: 80 mL via INTRAVENOUS

## 2021-07-10 MED ORDER — IOHEXOL 9 MG/ML PO SOLN
ORAL | Status: AC
  Administered 2021-07-10: 500 mL
  Filled 2021-07-10: qty 1000

## 2021-07-10 MED ORDER — ENOXAPARIN SODIUM 80 MG/0.8ML IJ SOSY
80.0000 mg | PREFILLED_SYRINGE | Freq: Two times a day (BID) | INTRAMUSCULAR | Status: DC
  Administered 2021-07-10 – 2021-07-11 (×2): 80 mg via SUBCUTANEOUS
  Filled 2021-07-10 (×2): qty 0.8

## 2021-07-10 NOTE — Progress Notes (Signed)
PROGRESS NOTE    [name]  [id number] DOB: 19-May-1946 DOA: 07/09/2021 PCP: [name], MD     Brief Narrative:  [name] is a 75 y.o. male with medical history significant for CLL, pancytopenia, history of pulmonary emboli related to travel and now on chronic warfarin, history of aortic valve replacement in 2011, who presents to the emergency department on 07/09/2021 with fevers and a positive blood culture.  Patient's original issue was that he had a nosebleed with and chills with fever 101 at home on 07/07/21; he did present to the emergency department, had cultures drawn, and was started on oral antibiotics. He was told to come back to the emergency department 07/08/21 due to finding of a positive blood culture; he came to the emergency department and improved and was sent home on oral levofloxacin. Around 2 am on 07/09/21 he developed a cold sweat, chills, then burning; temperature was 103 at home. He took Tylenol at home. ED physician discussed the case with the pharmacist who recommended Ancef, which was started in the emergency department.  New events last 24 hours / Subjective: Patient had night sweats.  Wants to walk around the hallway today.  Denies any chest pain, worsening shortness of breath, nausea, vomiting, abdominal pain or dysuria.  Assessment & Plan:   Principal Problem:   Bacteremia Active Problems:   Essential hypertension   PROSTATE CANCER, HX OF   Chronic lymphocytic leukemia (HCC)   S/P Bioprosthetic AVR (aortic valve replacement) in 2011   CKD (chronic kidney disease), stage III (HCC)   History of pulmonary embolism   Staph bacteremia -1 set drawn on 8/27 revealed staph hominis -Repeat blood cultures drawn 8/29 pending -Continue IV Ancef -Echocardiogram pending -Infectious disease consulted today  AKI on CKD stage IIIa -Baseline creatinine 1.5 -Continue to monitor, AKI resolved   CLL Pancytopenia -Currently on therapy with venetoclax   -Followed by [name] -Started on Neupogen today  History of PE -Continue warfarin  HLD -Continue crestor  Depression/anxiety  -Continue zoloft, ativan prn  HTN -Continue HCTZ     DVT prophylaxis: Coumadin SCDs Start: 07/09/21 1840 Place and maintain sequential compression device Start: 07/09/21 [id number]  Code Status:     Code Status Orders  (From admission, onward)           Start     Ordered   07/09/21 1840  Full code  Continuous        07/09/21 1839           Code Status History     Date Active Date Inactive Code Status Order ID Comments User Context   11/02/2014 1644 11/03/2014 1731 Full Code [id number]  Thurnell Lose, MD Inpatient      Advance Directive Documentation    Flowsheet Row Most Recent Value  Type of Advance Directive Living will  Pre-existing out of facility DNR order (yellow form or pink MOST form) --  "MOST" Form in Place? --      Family Communication: No family at bedside Disposition Plan:  Status is: Inpatient  Remains inpatient appropriate because:IV treatments appropriate due to intensity of illness or inability to take PO  Dispo: The patient is from: Home              Anticipated d/c is to: Home              Patient currently is not medically stable to d/c.  Remains on IV antibiotics.  Infectious disease consulted today  due to staph bacteremia.   Difficult to place patient No      Consultants:  Oncology Infectious disease   Antimicrobials:  Anti-infectives (From admission, onward)    Start     Dose/Rate Route Frequency Ordered Stop   07/09/21 1800  vancomycin (VANCOREADY) IVPB 1750 mg/350 mL  Status:  Discontinued        1,750 mg 175 mL/hr over 120 Minutes Intravenous  Once 07/09/21 1704 07/09/21 1728   07/09/21 0715  ceFAZolin (ANCEF) IVPB 2g/100 mL premix        2 g 200 mL/hr over 30 Minutes Intravenous Every 8 hours 07/09/21 0708          Objective: Vitals:   07/09/21 1515 07/09/21 2044 07/09/21  2313 07/10/21 0318  BP: 118/60 121/72 120/65 121/81  Pulse: 85 78 79 79  Resp: '20 19 15 18  '$ Temp: 99.3 F (37.4 C) 100 F (37.8 C) (!) 100.7 F (38.2 C) 100.3 F (37.9 C)  TempSrc: Oral Oral Oral Oral  SpO2: 95% 99% 95% 99%  Weight:      Height:        Intake/Output Summary (Last 24 hours) at 07/10/2021 0959 Last data filed at 07/09/2021 1422 Gross per 24 hour  Intake 100 ml  Output --  Net 100 ml   Filed Weights   07/09/21 0639  Weight: 87.5 kg    Examination:  General exam: Appears calm and comfortable  Respiratory system: Clear to auscultation. Respiratory effort normal. No respiratory distress. No conversational dyspnea.  Cardiovascular system: S1 & S2 heard, RRR. + Systolic murmur Gastrointestinal system: Abdomen is nondistended, soft and nontender. Normal bowel sounds heard. Central nervous system: Alert and oriented. No focal neurological deficits. Speech clear.  Extremities: Symmetric in appearance  Skin: No rashes, lesions or ulcers on exposed skin  Psychiatry: Judgement and insight appear normal. Mood & affect appropriate.   Data Reviewed: I have personally reviewed following labs and imaging studies  CBC: Recent Labs  Lab 07/05/21 1416 07/07/21 0605 07/09/21 0708 07/10/21 0521  WBC 2.7* 2.6* 1.9* 1.8*  NEUTROABS 1.3* 1.3* 1.1*  --   HGB 11.3* 11.5* 11.1* 11.0*  HCT 34.0* 34.1* 33.7* 34.0*  MCV 97.1 95.8 96.0 99.1  PLT 142* 102* 76* 77*   Basic Metabolic Panel: Recent Labs  Lab 07/05/21 1416 07/07/21 0605 07/09/21 0708 07/10/21 0521  NA 136 135 134* 135  K 4.1 3.9 3.7 3.6  CL 101 102 100 102  CO2 '26 25 25 25  '$ GLUCOSE 124* 99 114* 105*  BUN 22 22 28* 22  CREATININE 1.74* 1.53* 1.99* 1.47*  CALCIUM 9.3 8.7* 8.8* 8.8*   GFR: Estimated Creatinine Clearance: 44.8 mL/min (A) (by C-G formula based on SCr of 1.47 mg/dL (H)). Liver Function Tests: Recent Labs  Lab 07/05/21 1416 07/07/21 0605  AST 21 22  ALT 16 16  ALKPHOS 67 55  BILITOT  0.4 0.6  PROT 6.5 6.2*  ALBUMIN 4.0 3.7   No results for input(s): LIPASE, AMYLASE in the last 168 hours. No results for input(s): AMMONIA in the last 168 hours. Coagulation Profile: Recent Labs  Lab 07/07/21 0605 07/09/21 0708  INR 2.8* 2.8*   Cardiac Enzymes: No results for input(s): CKTOTAL, CKMB, CKMBINDEX, TROPONINI in the last 168 hours. BNP (last 3 results) No results for input(s): PROBNP in the last 8760 hours. HbA1C: No results for input(s): HGBA1C in the last 72 hours. CBG: No results for input(s): GLUCAP in the last 168 hours. Lipid Profile:  No results for input(s): CHOL, HDL, LDLCALC, TRIG, CHOLHDL, LDLDIRECT in the last 72 hours. Thyroid Function Tests: No results for input(s): TSH, T4TOTAL, FREET4, T3FREE, THYROIDAB in the last 72 hours. Anemia Panel: No results for input(s): VITAMINB12, FOLATE, FERRITIN, TIBC, IRON, RETICCTPCT in the last 72 hours. Sepsis Labs: Recent Labs  Lab 07/07/21 0605 07/09/21 0708 07/09/21 0900 07/10/21 0521  PROCALCITON <0.10 0.15  --  0.15  LATICACIDVEN 0.5  --  0.8  --     Recent Results (from the past 240 hour(s))  Blood Culture (routine x 2)     Status: Abnormal   Collection Time: 07/07/21  6:05 AM   Specimen: BLOOD  Result Value Ref Range Status   Specimen Description   Final    BLOOD BLOOD RIGHT FOREARM Performed at [facility], [address] [place], [place] [address]    Special Requests   Final    BOTTLES DRAWN AEROBIC AND ANAEROBIC Blood Culture adequate volume Performed at [facility], [address] [place], [place] [address]    Culture  Setup Time   Final    GRAM POSITIVE COCCI IN CLUSTERS ANAEROBIC BOTTLE ONLY CRITICAL RESULT CALLED TO, READ BACK BY AND VERIFIED WITH: Mickie Hillier RN '@1007'$  07/08/21 EB    Culture (A)  Final    STAPHYLOCOCCUS HOMINIS THE SIGNIFICANCE OF ISOLATING THIS ORGANISM FROM A SINGLE SET OF BLOOD CULTURES WHEN MULTIPLE SETS ARE DRAWN IS  UNCERTAIN. PLEASE NOTIFY THE MICROBIOLOGY DEPARTMENT WITHIN ONE WEEK IF SPECIATION AND SENSITIVITIES ARE REQUIRED. Performed at [facility], [place] [address] [place], [place] [address]    Report Status 07/10/2021 FINAL  Final  Resp Panel by RT-PCR (Flu A&B, Covid) Peripheral     Status: None   Collection Time: 07/07/21  6:05 AM   Specimen: Peripheral; Nasopharyngeal(NP) swabs in vial transport medium  Result Value Ref Range Status   SARS Coronavirus 2 by RT PCR NEGATIVE NEGATIVE Final    Comment: (NOTE) SARS-CoV-2 target nucleic acids are NOT DETECTED.  The SARS-CoV-2 RNA is generally detectable in upper respiratory specimens during the acute phase of infection. The lowest concentration of SARS-CoV-2 viral copies this assay can detect is 138 copies/mL. A negative result does not preclude SARS-Cov-2 infection and should not be used as the sole basis for treatment or other patient management decisions. A negative result may occur with  improper specimen collection/handling, submission of specimen other than nasopharyngeal swab, presence of viral mutation(s) within the areas targeted by this assay, and inadequate number of viral copies(<138 copies/mL). A negative result must be combined with clinical observations, patient history, and epidemiological information. The expected result is Negative.  Fact Sheet for Patients:  EntrepreneurPulse.com.au  Fact Sheet for Healthcare Providers:  IncredibleEmployment.be  This test is no t yet approved or cleared by the [place] FDA and  has been authorized for detection and/or diagnosis of SARS-CoV-2 by FDA under an Emergency Use Authorization (EUA). This EUA will remain  in effect (meaning this test can be used) for the duration of the COVID-19 declaration under Section 564(b)(1) of the Act, 21 U.S.C.section 360bbb-3(b)(1), unless the authorization is terminated  or revoked sooner.        Influenza A by PCR NEGATIVE NEGATIVE Final   Influenza B by PCR NEGATIVE NEGATIVE Final    Comment: (NOTE) The Xpert Xpress SARS-CoV-2/FLU/RSV plus assay is intended as an aid in the diagnosis of influenza from Nasopharyngeal swab specimens and should not be used as a sole basis  for treatment. Nasal washings and aspirates are unacceptable for Xpert Xpress SARS-CoV-2/FLU/RSV testing.  Fact Sheet for Patients: EntrepreneurPulse.com.au  Fact Sheet for Healthcare Providers: IncredibleEmployment.be  This test is not yet approved or cleared by the [place] FDA and has been authorized for detection and/or diagnosis of SARS-CoV-2 by FDA under an Emergency Use Authorization (EUA). This EUA will remain in effect (meaning this test can be used) for the duration of the COVID-19 declaration under Section 564(b)(1) of the Act, 21 U.S.C. section 360bbb-3(b)(1), unless the authorization is terminated or revoked.  Performed at [facility], [address], [place], [place] [address]   Blood Culture ID Panel (Reflexed)     Status: Abnormal   Collection Time: 07/07/21  6:05 AM  Result Value Ref Range Status   Enterococcus faecalis NOT DETECTED NOT DETECTED Final   Enterococcus Faecium NOT DETECTED NOT DETECTED Final   Listeria monocytogenes NOT DETECTED NOT DETECTED Final   Staphylococcus species DETECTED (A) NOT DETECTED Final    Comment: CRITICAL RESULT CALLED TO, READ BACK BY AND VERIFIED WITH: [place] RN '@1007'$  07/08/21 EB    Staphylococcus aureus (BCID) NOT DETECTED NOT DETECTED Final   Staphylococcus epidermidis NOT DETECTED NOT DETECTED Final   Staphylococcus lugdunensis NOT DETECTED NOT DETECTED Final   Streptococcus species NOT DETECTED NOT DETECTED Final   Streptococcus agalactiae NOT DETECTED NOT DETECTED Final   Streptococcus pneumoniae NOT DETECTED NOT DETECTED Final   Streptococcus pyogenes NOT DETECTED NOT DETECTED  Final   A.calcoaceticus-baumannii NOT DETECTED NOT DETECTED Final   Bacteroides fragilis NOT DETECTED NOT DETECTED Final   Enterobacterales NOT DETECTED NOT DETECTED Final   Enterobacter cloacae complex NOT DETECTED NOT DETECTED Final   Escherichia coli NOT DETECTED NOT DETECTED Final   Klebsiella aerogenes NOT DETECTED NOT DETECTED Final   Klebsiella oxytoca NOT DETECTED NOT DETECTED Final   Klebsiella pneumoniae NOT DETECTED NOT DETECTED Final   Proteus species NOT DETECTED NOT DETECTED Final   Salmonella species NOT DETECTED NOT DETECTED Final   Serratia marcescens NOT DETECTED NOT DETECTED Final   Haemophilus influenzae NOT DETECTED NOT DETECTED Final   Neisseria meningitidis NOT DETECTED NOT DETECTED Final   Pseudomonas aeruginosa NOT DETECTED NOT DETECTED Final   Stenotrophomonas maltophilia NOT DETECTED NOT DETECTED Final   Candida albicans NOT DETECTED NOT DETECTED Final   Candida auris NOT DETECTED NOT DETECTED Final   Candida glabrata NOT DETECTED NOT DETECTED Final   Candida krusei NOT DETECTED NOT DETECTED Final   Candida parapsilosis NOT DETECTED NOT DETECTED Final   Candida tropicalis NOT DETECTED NOT DETECTED Final   Cryptococcus neoformans/gattii NOT DETECTED NOT DETECTED Final    Comment: Performed at [facility], [address], [place], [place] [address]  MRSA Next Gen by PCR, Nasal     Status: None   Collection Time: 07/08/21  3:27 PM   Specimen: Nasal Mucosa; Nasal Swab  Result Value Ref Range Status   MRSA by PCR Next Gen NOT DETECTED NOT DETECTED Final    Comment: (NOTE) The GeneXpert MRSA Assay (FDA approved for NASAL specimens only), is one component of a comprehensive MRSA colonization surveillance program. It is not intended to diagnose MRSA infection nor to guide or monitor treatment for MRSA infections. Test performance is not FDA approved in patients less than 43 years old. Performed at [facility], [place] [address] [place],  [address]   Blood culture (routine x 2)     Status: None (Preliminary result)  Collection Time: 07/09/21  7:08 AM   Specimen: BLOOD  Result Value Ref Range Status   Specimen Description   Final    BLOOD BLOOD RIGHT FOREARM Performed at [facility], [address] [place], [place] [address]    Special Requests   Final    BOTTLES DRAWN AEROBIC AND ANAEROBIC Blood Culture adequate volume Performed at [facility], [address] [place], [place] [address]    Culture   Final    NO GROWTH < 24 HOURS Performed at [facility], [place] [address] [place], [place] [address]    Report Status PENDING  Incomplete  Resp Panel by RT-PCR (Flu A&B, Covid) Nasopharyngeal Swab     Status: None   Collection Time: 07/09/21  7:29 AM   Specimen: Nasopharyngeal Swab; Nasopharyngeal(NP) swabs in vial transport medium  Result Value Ref Range Status   SARS Coronavirus 2 by RT PCR NEGATIVE NEGATIVE Final    Comment: (NOTE) SARS-CoV-2 target nucleic acids are NOT DETECTED.  The SARS-CoV-2 RNA is generally detectable in upper respiratory specimens during the acute phase of infection. The lowest concentration of SARS-CoV-2 viral copies this assay can detect is 138 copies/mL. A negative result does not preclude SARS-Cov-2 infection and should not be used as the sole basis for treatment or other patient management decisions. A negative result may occur with  improper specimen collection/handling, submission of specimen other than nasopharyngeal swab, presence of viral mutation(s) within the areas targeted by this assay, and inadequate number of viral copies(<138 copies/mL). A negative result must be combined with clinical observations, patient history, and epidemiological information. The expected result is Negative.  Fact Sheet for Patients:  EntrepreneurPulse.com.au  Fact Sheet for Healthcare Providers:   IncredibleEmployment.be  This test is no t yet approved or cleared by the [place] FDA and  has been authorized for detection and/or diagnosis of SARS-CoV-2 by FDA under an Emergency Use Authorization (EUA). This EUA will remain  in effect (meaning this test can be used) for the duration of the COVID-19 declaration under Section 564(b)(1) of the Act, 21 U.S.C.section 360bbb-3(b)(1), unless the authorization is terminated  or revoked sooner.       Influenza A by PCR NEGATIVE NEGATIVE Final   Influenza B by PCR NEGATIVE NEGATIVE Final    Comment: (NOTE) The Xpert Xpress SARS-CoV-2/FLU/RSV plus assay is intended as an aid in the diagnosis of influenza from Nasopharyngeal swab specimens and should not be used as a sole basis for treatment. Nasal washings and aspirates are unacceptable for Xpert Xpress SARS-CoV-2/FLU/RSV testing.  Fact Sheet for Patients: EntrepreneurPulse.com.au  Fact Sheet for Healthcare Providers: IncredibleEmployment.be  This test is not yet approved or cleared by the [place] FDA and has been authorized for detection and/or diagnosis of SARS-CoV-2 by FDA under an Emergency Use Authorization (EUA). This EUA will remain in effect (meaning this test can be used) for the duration of the COVID-19 declaration under Section 564(b)(1) of the Act, 21 U.S.C. section 360bbb-3(b)(1), unless the authorization is terminated or revoked.  Performed at [facility], [address], [place],  [address]   Blood culture (routine x 2)     Status: None (Preliminary result)   Collection Time: 07/09/21  7:30 AM   Specimen: BLOOD LEFT HAND  Result Value Ref Range Status   Specimen Description   Final    BLOOD LEFT HAND Performed at [facility], [address] [place],  [address]    Special Requests  Final    Blood Culture adequate volume Performed at [facility], [address], [place], [place] [address]    Culture   Final    NO GROWTH < 24 HOURS Performed at [facility], [place] [address] [place], [place] [address]    Report Status PENDING  Incomplete      Radiology Studies: DG Chest 2 View  Result Date: 07/08/2021 CLINICAL DATA:  Fever. EXAM: CHEST - 2 VIEW COMPARISON:  July 07, 2021. FINDINGS: The heart size and mediastinal contours are within normal limits. Both lungs are clear. Status post aortic valve repair. The visualized skeletal structures are unremarkable. IMPRESSION: No active cardiopulmonary disease. Electronically Signed   By: Marijo Conception M.D.   On: 07/08/2021 14:45      Scheduled Meds:  cholecalciferol  2,000 Units Oral q morning   filgrastim  480 mcg Subcutaneous Daily   meclizine  25 mg Oral q morning   omeprazole  40 mg Oral QPM   rosuvastatin  10 mg Oral QHS   sertraline  100 mg Oral q morning   Warfarin - Pharmacist Dosing Inpatient   Does not apply q1600   Continuous Infusions:   ceFAZolin (ANCEF) IV 2 g (07/10/21 0555)     LOS: 1 day      Time spent: 30 minutes   Dessa Phi, DO [facility] 07/10/2021, 9:59 AM   Available via Epic secure chat 7am-7pm After these hours, please refer to coverage provider listed on [email or web address]

## 2021-07-10 NOTE — Consult Note (Signed)
Cardiology Consultation:   Patient ID: [name] MRN: [id number]; DOB: 16-Nov-1945  Admit date: 07/09/2021 Date of Consult: 07/10/2021  PCP:  [name], MD   [facility] Providers Cardiologist:  Jenkins Rouge, MD        Patient Profile:   [name] is a 75 y.o. male with a hx of LL, pancytopenia, pulmonary emboli on warfarin, bioprosthetic aortic valve replacement in 2011 who is being seen 07/10/2021 for the evaluation of endocarditis at the request of Dr Maylene Roes.  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  He initially presented to the ED on 8/27 with fever and nosebleeding.  Cultures were drawn, he was started on oral antibiotics and discharged.  He was then recommended to come back to the ED on 8/28 for positive blood culture.  He was actually discharged from the ED on 8/28 on oral levofloxacin.  He subsequently had fever and chills on 8/29 and presented back to the ED.  Initial vital signs notable for BP 111/79, pulse 75, SPO2 95% on room air.  Has had fever up to 101.6 F this admission.  Labs notable for creatinine 1.99 (has improved to 1.47 today), WBC 1.9, hemoglobin 11.1, platelets 76, INR 2.3.  Blood culture on 8/27 growing staph hominis.  Repeat blood cultures on 8/29 with no growth to date.  Echocardiogram on 8/30 shows EF 55 to 60%, normal RV function, no vegetation or perivalvular abscess seen, aortic valve mean gradient measures 47 mmHg, V-max 4.4 m/s, acceleration time 90 ms.  Compared to echo 12/13/2020, has higher gradients but unchanged dimensionless index and calculated effective valve area, which along with low acceleration time, suggest increased gradients to high output state in setting of chronic aortic prosthesis mismatch/stenosis.  He  follows with Dr. [name] in cardiology, last seen 04/2021.  He underwent bioprosthetic AVR in July 2011 for severe AI at [facility].  Echo 08/2019 showed increased gradients through aortic valve up to 25 mmHg.  CT showed HALT/HAM, was started on warfarin.  Echo 12/15/2019 showed mean gradients 30 to 39 mmHg.  Echo 12/13/2020 showed mean gradient 28 mmHg, DI 0.24, AVA 1.2 cm.   Past Medical History:  Diagnosis Date   Anticoagulants causing adverse effect in therapeutic use    Anxiety    Paxil seemed to cause odd neuro side effects; better on prozac as of 09/2015   Aortic regurgitation 04/2007 surgery   Resolved with tissue AVR at [facility]   Cervical spondylosis    ESI by [name] in [place]. 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 (most recent 07/2017)   History of prostate cancer 2003   Rad prost   History of pulmonary embolism 2011; 10/2014   Recurrence off of anticoagulation 10/2014: per hem/onc ([name]) pt needs lifelong anticoagulation (hypercoag w/u neg).   Hyperlipidemia    statin-intolerant, except for crestor low dose.   Hypertension    Migraine syndrome    Mitral regurgitation    PAF (paroxysmal atrial fibrillation) (HCC)    Panic attacks    "           "             "                  "                  "                       "                             "  Pulmonary nodule, right 2015   RLL: resected at [facility] --VATS; Benign RLL nodule (fungal and AFB stains neg).  Plan is for [facility] thoracic surg to do f/u o/v & CT chest 1 yr    Right-sided headache 07/2016   Primary stabbing HA or migraine per neuro (Dr. Tomi Likens).  Gabapentin and topamax not tolerated.  These resolved spontaneously.    Past Surgical History:  Procedure Laterality Date   AORTIC VALVE REPLACEMENT  2011   [place]  (bioprosthetic)   CARDIAC CATHETERIZATION  04/2010   Normal coronaries.   Carotid dopplers  08/2015    NORMAL   CATARACT EXTRACTION     L 12/06/09   R 09/14/09   COLONOSCOPY  01/26/13   tic's, o/w normal.  ([facility])--recall 10 yrs.   PENILE PROSTHESIS IMPLANT     PROSTATECTOMY  04/2002   for prostate cancer   Pulmonary nodule resection  Fall/winter 2016   [facility]   SHOULDER SURGERY     rotator cuff on both arms    TIBIALIS TENDON TRANSFER / REPAIR Right 03/02/2019   Procedure: RECONSTRUCTION OF RIGHT  ANTERIOR TIBIALIS TENDON;  Surgeon: Erle Crocker, MD;  Location: [facility];  Service: Orthopedics;  Laterality: Right;   TRANSTHORACIC ECHOCARDIOGRAM  09/20/2014; 03/2016; 03/2017   2015-mild LVH, EF 50-55%, wall motion nl, grade I diast dysfxn, prosth aort valve good,transaortic gradients decreased compared to echo 11/2013.   03/2016: EF 55-60%, normal LV function, severe LVH, normal diast fxn, mild increase in AV gradient compared to 09/2014 echo.  2018: EF 55-60%, normal LV fxn, grd II DD, AV stable/gradient's stable.       Inpatient Medications: Scheduled Meds:  cholecalciferol  2,000 Units Oral q morning   filgrastim  480 mcg Subcutaneous Daily   hydrochlorothiazide  12.5 mg Oral Daily   iohexol  500 mL Oral Q1H   iohexol       meclizine  25 mg Oral q morning   omeprazole  40 mg Oral QPM   rosuvastatin  10 mg Oral QHS   sertraline  100 mg Oral q morning   warfarin  5 mg Oral ONCE-1600   Warfarin - Pharmacist Dosing Inpatient   Does not apply q1600   Continuous Infusions:  PRN Meds: acetaminophen **OR** acetaminophen, bisacodyl, ipratropium, LORazepam, metoprolol tartrate, ondansetron **OR** ondansetron (ZOFRAN) IV, senna-docusate  Allergies:    Allergies  Allergen Reactions   Hydrocodone-Acetaminophen Shortness Of Breath   Oxycodone Hcl Shortness Of Breath   Telmisartan-Hctz Shortness Of Breath, Palpitations and Other (See Comments)    Dizziness also   Ramipril Other (See Comments)    Unknown reaction   Aspirin Rash and Other (See Comments)    Confusion  also Cannot take high dose   Atorvastatin Other (See Comments)    myalgia   Buspirone Hcl Other (See Comments)    headache   Buspirone Hcl Other (See Comments)    headache   Codeine Hives, Nausea And Vomiting and Other (See Comments)    Confusion also   Morphine Sulfate Itching   Paroxetine Other (See Comments)    Paresthesias: R arm, R leg, r side of face   Rabeprazole Other (See Comments)    headache    Social History:   Social History   Socioeconomic History   Marital status: Single    Spouse name: Not on file   Number of children: Not on file   Years of education: Not on file   Highest education level: Not on file  Occupational History    Comment: Retired - Tobacco   Tobacco Use   Smoking status: Former    Packs/day: 1.00    Years: 12.00    Pack years: 12.00    Types: Cigarettes    Start date: 11/28/1968    Quit date: 01/29/1981    Years since quitting: 40.4   Smokeless tobacco: Never   Tobacco comments:    quit 75 yo   Vaping Use   Vaping Use: Never used  Substance and Sexual Activity   Alcohol use: Not Currently    Comment: occasional   Drug use: No   Sexual activity: Not on file  Other Topics Concern   Not on file  Social History Narrative   Single. No children. Lives alone      Retired from [place].       Hobbies: travel, Scientist, product/process development, enjoys being outdoors including hiking   Social Determinants of Radio broadcast assistant Strain: Low Risk    Difficulty of Paying Living Expenses: Not hard at all  Food Insecurity: No Food Insecurity   Worried About Charity fundraiser in the Last Year: Never true   Arboriculturist in the Last Year: Never true  Transportation Needs: No Transportation Needs   Lack of Transportation (Medical): No   Lack of Transportation (Non-Medical): No  Physical Activity: Sufficiently Active   Days of Exercise per Week: 5 days   Minutes of Exercise per Session: 60 min  Stress: No Stress Concern  Present   Feeling of Stress : Only a little  Social Connections: Socially Isolated   Frequency of Communication with Friends and Family: More than three times a week   Frequency of Social Gatherings with Friends and Family: More than three times a week   Attends Religious Services: Never   Marine scientist or Organizations: No   Attends Music therapist: Never   Marital Status: Never married  Human resources officer Violence: Not At Risk   Fear of Current or Ex-Partner: No   Emotionally Abused: No   Physically Abused: No   Sexually Abused: No    Family History:    Family History  Problem Relation Age of Onset   Cancer Sister        uterine   Colon cancer Sister 39   Stroke Mother    Prostate cancer Father    Stroke Sister    Stroke Brother    Heart attack Neg Hx      ROS:  Please see the history of present illness.   All other ROS reviewed and negative.     Physical Exam/Data:   Vitals:   07/09/21 1515 07/09/21 2044 07/09/21 2313 07/10/21 0318  BP: 118/60 121/72 120/65 121/81  Pulse: 85 78 79 79  Resp: $Remo'20 19 15 18  'JiwNu$ Temp: 99.3 F (37.4 C) 100 F (37.8 C) (!) 100.7 F (38.2 C) 100.3 F (37.9 C)  TempSrc: Oral Oral Oral Oral  SpO2: 95% 99% 95% 99%  Weight:      Height:       No intake or output data in the 24 hours ending 07/10/21 1428 Last 3 Weights 07/09/2021 07/09/2021 07/07/2021  Weight (lbs) 193 lb 193 lb 200 lb  Weight (kg) 87.544 kg 87.544 kg 90.719 kg     Body mass index is 27.69 kg/m.  General:  in no acute distress HEENT: normal Lymph: no adenopathy Neck: no JVD Cardiac:  RRR; 3/6 systolic murmur  Lungs:  clear to auscultation bilaterally, no wheezing, rhonchi or rales  Abd: soft, nontender Ext: no edema Musculoskeletal:  No deformities, Skin: warm and dry  Neuro:  no focal abnormalities noted Psych:  Normal affect   EKG:  The EKG was personally reviewed and demonstrates: Normal sinus rhythm, LVH, right bundle branch block, left  anterior fascicular block Telemetry:  not on telemetry  Relevant CV Studies:   Laboratory Data:  High Sensitivity Troponin:  No results for input(s): TROPONINIHS in the last 720 hours.   Chemistry Recent Labs  Lab 07/07/21 0605 07/09/21 0708 07/10/21 0521  NA 135 134* 135  K 3.9 3.7 3.6  CL 102 100 102  CO2 $Re'25 25 25  'xcf$ GLUCOSE 99 114* 105*  BUN 22 28* 22  CREATININE 1.53* 1.99* 1.47*  CALCIUM 8.7* 8.8* 8.8*  GFRNONAA 47* 34* 49*  ANIONGAP $RemoveB'8 9 8    'GyUboupf$ Recent Labs  Lab 07/05/21 1416 07/07/21 0605  PROT 6.5 6.2*  ALBUMIN 4.0 3.7  AST 21 22  ALT 16 16  ALKPHOS 67 55  BILITOT 0.4 0.6   Hematology Recent Labs  Lab 07/07/21 0605 07/09/21 0708 07/10/21 0521  WBC 2.6* 1.9* 1.8*  RBC 3.56* 3.51* 3.43*  HGB 11.5* 11.1* 11.0*  HCT 34.1* 33.7* 34.0*  MCV 95.8 96.0 99.1  MCH 32.3 31.6 32.1  MCHC 33.7 32.9 32.4  RDW 13.4 13.5 13.7  PLT 102* 76* 77*   BNPNo results for input(s): BNP, PROBNP in the last 168 hours.  DDimer No results for input(s): DDIMER in the last 168 hours.   Radiology/Studies:  DG Chest 2 View  Result Date: 07/08/2021 CLINICAL DATA:  Fever. EXAM: CHEST - 2 VIEW COMPARISON:  July 07, 2021. FINDINGS: The heart size and mediastinal contours are within normal limits. Both lungs are clear. Status post aortic valve repair. The visualized skeletal structures are unremarkable. IMPRESSION: No active cardiopulmonary disease. Electronically Signed   By: Marijo Conception M.D.   On: 07/08/2021 14:45   DG Chest 2 View  Result Date: 07/07/2021 CLINICAL DATA:  75 year old male with possible sepsis.  Fever.  CLL. EXAM: CHEST - 2 VIEW COMPARISON:  Chest radiographs 06/18/2021 and earlier. FINDINGS: Chronic sternotomy and cardiac valve replacement. Normal cardiac size and mediastinal contours. Visualized tracheal air column is within normal limits. Lung volumes are within normal limits. No pneumothorax, pulmonary edema, pleural effusion or confluent pulmonary opacity. No  acute osseous abnormality identified. Negative visible bowel gas pattern. Stable small dystrophic calcifications or retained radiopaque foreign bodies at the right axilla. IMPRESSION: No acute cardiopulmonary abnormality. Electronically Signed   By: Genevie Ann M.D.   On: 07/07/2021 06:48   ECHOCARDIOGRAM COMPLETE  Result Date: 07/10/2021    ECHOCARDIOGRAM REPORT   Patient Name:   [name] Date of Exam: 07/10/2021 Medical Rec #:  [id number]      Height:       70.0 in Accession #:    [id number]     Weight:       193.0 lb Date of Birth:  1945-11-22      BSA:          2.056 m Patient Age:    42 years       BP:           132/82 mmHg Patient Gender: M              HR:           83 bpm. Exam Location:  Inpatient Procedure: 2D Echo, Cardiac Doppler and Color Doppler Indications:    Bacteremia  History:        Patient has prior history of Echocardiogram examinations, most                 recent 06/21/2020. Arrythmias:Atrial Fibrillation,                 Signs/Symptoms:Bacteremia; Risk Factors:Dyslipidemia. TAVR,                 afib,HLD.                 Aortic Valve: 25 mm Carpentier bioprosthetic valve is present in                 the aortic position. Procedure Date: 2011.  Sonographer:    MH Referring Phys: [place]  1. Left ventricular ejection fraction, by estimation, is 55 to 60%. The left ventricle has normal function. The left ventricle has no regional wall motion abnormalities. There is moderate concentric left ventricular hypertrophy. Left ventricular diastolic parameters are indeterminate.  2. Right ventricular systolic function is normal. The right ventricular size is normal. There is normal pulmonary artery systolic pressure.  3. Left atrial size was mildly dilated.  4. The mitral valve is normal in structure. Mild mitral valve regurgitation.  5. No vegetation or perivalvular abscess is seen. The aortic valve has been repaired/replaced. Aortic valve regurgitation is not visualized.  There is a 25 mm Carpentier bioprosthetic valve present in the aortic position. Procedure Date: 2011. Echo findings are consistent with stenosis of the aortic prosthesis. Aortic valve mean gradient measures 46.5 mmHg. Aortic valve Vmax measures 4.38 m/s. Aortic valve acceleration time measures 90 msec.  6. There is moderate dilatation of the ascending aorta, measuring 47 mm. Comparison(s): Prior images reviewed side by side. Changes from prior study are noted. The transprosthetic gradients are markedly increased, in the range of severe stenosis. This is a chronic abnormality. Although the gradients are higher since 12/13/2020, the dimensionless index and the calculated effective valve area are unchanged. This suggests that the higher gradients are due to an increase in cardiac output, superimposed on chronic aortic prosthesis mismatch/stenosis,. Conclusion(s)/Recommendation(s): No evidence of valvular vegetations on this transthoracic echocardiogram. Would recommend a transesophageal echocardiogram to exclude infective endocarditis if clinically indicated. FINDINGS  Left Ventricle: Left ventricular ejection fraction, by estimation, is 55 to 60%. The left ventricle has normal function. The left ventricle has no regional wall motion abnormalities. The left ventricular internal cavity size was normal in size. There is  moderate concentric left ventricular hypertrophy. Left ventricular diastolic parameters are indeterminate. Right Ventricle: The right ventricular size is normal. No increase in right ventricular wall thickness. Right ventricular systolic function is normal. There is normal pulmonary artery systolic pressure. The tricuspid regurgitant velocity is 2.71 m/s, and  with an assumed right atrial pressure of 3 mmHg, the estimated right ventricular systolic pressure is 86.7 mmHg. Left Atrium: Left atrial size was mildly dilated. Right Atrium: Right atrial size was normal in size. Pericardium: There is no evidence  of pericardial effusion. Mitral Valve: The mitral valve is normal in structure. Mild mitral annular calcification. Mild mitral valve regurgitation. Tricuspid Valve: Diastolic TR is seen due to 1st degree AV block. The tricuspid valve is normal in structure. Tricuspid valve regurgitation is trivial. Aortic Valve: No vegetation or perivalvular abscess is seen. The aortic valve has been repaired/replaced. Aortic valve regurgitation is not visualized. Aortic valve  mean gradient measures 46.5 mmHg. Aortic valve peak gradient measures 76.9 mmHg. Aortic valve area, by VTI measures 0.99 cm. There is a 25 mm Carpentier bioprosthetic valve present in the aortic position. Procedure Date: 2011. Pulmonic Valve: The pulmonic valve was grossly normal. Pulmonic valve regurgitation is not visualized. Aorta: The aortic root is normal in size and structure. There is moderate dilatation of the ascending aorta, measuring 47 mm. IAS/Shunts: No atrial level shunt detected by color flow Doppler.  LEFT VENTRICLE PLAX 2D LVIDd:         5.00 cm     Diastology LVIDs:         3.50 cm     LV e' medial:    7.51 cm/s LV PW:         1.60 cm     LV E/e' medial:  12.0 LV IVS:        1.60 cm     LV e' lateral:   10.80 cm/s LVOT diam:     2.30 cm     LV E/e' lateral: 8.4 LV SV:         88 LV SV Index:   43 LVOT Area:     4.15 cm  LV Volumes (MOD) LV vol d, MOD A4C: 82.7 ml LV vol s, MOD A4C: 26.7 ml LV SV MOD A4C:     82.7 ml RIGHT VENTRICLE RV S prime:     14.60 cm/s TAPSE (M-mode): 2.0 cm LEFT ATRIUM             Index       RIGHT ATRIUM           Index LA diam:        3.80 cm 1.85 cm/m  RA Area:     15.00 cm LA Vol (A2C):   48.4 ml 23.54 ml/m RA Volume:   29.70 ml  14.45 ml/m LA Vol (A4C):   54.4 ml 26.46 ml/m LA Biplane Vol: 51.8 ml 25.20 ml/m  AORTIC VALVE AV Area (Vmax):    1.07 cm AV Area (Vmean):   1.46 cm AV Area (VTI):     0.99 cm AV Vmax:           438.43 cm/s AV Vmean:          216.980 cm/s AV VTI:            0.889 m AV Peak Grad:       76.9 mmHg AV Mean Grad:      46.5 mmHg LVOT Vmax:         113.00 cm/s LVOT Vmean:        76.500 cm/s LVOT VTI:          0.211 m LVOT/AV VTI ratio: 0.24  AORTA Ao Root diam: 3.60 cm Ao Asc diam:  4.70 cm MITRAL VALVE               TRICUSPID VALVE MV Area (PHT): 3.61 cm    TR Peak grad:   29.4 mmHg MV E velocity: 90.41 cm/s  TR Vmax:        271.00 cm/s MV A velocity: 66.80 cm/s MV E/A ratio:  1.35        SHUNTS                            Systemic VTI:  0.21 m  Systemic Diam: 2.30 cm Sanda Klein MD Electronically signed by Sanda Klein MD Signature Date/Time: 07/10/2021/1:38:57 PM    Final      Assessment and Plan:   S/p bioprosthetic AV: He underwent bioprosthetic AVR in July 2011 for severe AI at [facility].  Echo 08/2019 showed increased gradients through aortic valve up to 25 mmHg.  CT showed HALT/HAM, was started on warfarin.  Echo 12/15/2019 showed mean gradients 39 mmHg.  Echo 12/13/2020 showed mean gradient 28 mmHg, DI 0.24, AVA 1.2 cm.  Echocardiogram on 8/30 shows EF 55 to 60%, normal RV function, no vegetation or perivalvular abscess seen, aortic valve mean gradient measures 47 mmHg, V-max 4.4 m/s, acceleration time 90 ms.  Compared to echo 12/13/2020, has higher gradients but unchanged dimensionless index and calculated effective valve area, which along with low acceleration time, suggest increased gradients to high output state in setting of chronic aortic prosthesis mismatch/stenosis.   -Recommend TEE to exclude endocarditis.  This has been scheduled for 9/2 (next available time)  Fevers/Staph bacteremia: Blood cultures on 8/7 growing staph hominis.  ID consulted.  AKI on CKD: Baseline creatinine 1.5, was up to 2.0 on admission.  Has improved to 1.5 today.  CLL: Has pancytopenia.  Platelet count stable in 70s, should be okay for TEE provided platelets above 50  History of recurrent DVT/PE: On warfarin.  INR 2.3.  Would favor holding for now in case procedures needed,  can start heparin gtt when subtherapeutic  PAF: in sinus rhythm.  On warfarin.  Hypertension: On hydrochlorothiazide   For questions or updates, please contact [place] Please consult [email or web address] for contact info under    Signed, Donato Heinz, MD  07/10/2021 2:28 PM

## 2021-07-10 NOTE — Progress Notes (Addendum)
ANTICOAGULATION CONSULT NOTE - follow up  Pharmacy Consult for LMWH bridge while warfarin on hold for TEE Indication: Hx PE/DVT, AVR  Allergies  Allergen Reactions   Hydrocodone-Acetaminophen Shortness Of Breath   Oxycodone Hcl Shortness Of Breath   Telmisartan-Hctz Shortness Of Breath, Palpitations and Other (See Comments)    Dizziness also   Ramipril Other (See Comments)    Unknown reaction   Aspirin Rash and Other (See Comments)    Confusion also Cannot take high dose   Atorvastatin Other (See Comments)    myalgia   Buspirone Hcl Other (See Comments)    headache   Buspirone Hcl Other (See Comments)    headache   Codeine Hives, Nausea And Vomiting and Other (See Comments)    Confusion also   Morphine Sulfate Itching   Paroxetine Other (See Comments)    Paresthesias: R arm, R leg, r side of face   Rabeprazole Other (See Comments)    headache    Patient Measurements: Height: '5\' 10"'$  (177.8 cm) Weight: 87.5 kg (193 lb) IBW/kg (Calculated) : 73 Heparin Dosing Weight:  Vital Signs:    Labs: Recent Labs    07/09/21 0708 07/10/21 0521 07/10/21 1100  HGB 11.1* 11.0*  --   HCT 33.7* 34.0*  --   PLT 76* 77*  --   LABPROT 29.3*  --  25.3*  INR 2.8*  --  2.3*  CREATININE 1.99* 1.47*  --      Estimated Creatinine Clearance: 44.8 mL/min (A) (by C-G formula based on SCr of 1.47 mg/dL (H)).   Assessment: 14 yoM admitted on 8/29 for fevers, neutropenia, hx of CLL.  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  Pharmacy is consulted to resume warfarin PTA Warfarin 7.5 mg daily except '5mg'$  on Mondays.  Last dose taken on 8/28 at 1830.   Admit INR 2.8 CBC:  Hgb 11.1 (baseline ~11.5-12), Plt 76 k (baseline 76-142k) Drug-drug interactions: PTA Venetoclax may increase the serum concentration of Warfarin Diet: regular INR goal per coumadin clinic 2.5-3 - last outpatient check 06/21/2021 INR 3.1  07/10/21 INR 2.3 - below his goal of 2.5 - 3. To start lovenox while warfarin on hold  for TEE Friday 9/2 (Heparin originally ordered but pt gets headache w/ heparin) CBC: Hgb 11 stable, Plts 77 low but stable from yesterday No reported bleeding SCr 1.47, CrCl ~ 45  Goal of Therapy:  Heparin level 0.3 - 0.7 Monitor platelets by anticoagulation protocol: Yes   Plan:  Lovenox 1 mg/kg sq q12 hrs - will round to nearest syringe size  LMWH 80 mg SQ q12h MD has ordered CBC w/ diff, INR & BMET for tomorrow Will f/u when to hold for TEE  Eudelia Bunch, Pharm.D 07/10/2021 5:49 PM   Eudelia Bunch, Pharm.D 07/10/2021 3:42 PM

## 2021-07-10 NOTE — Progress Notes (Signed)
ANTICOAGULATION CONSULT NOTE - follow up  Pharmacy Consult for Warfarin Indication: Hx PE, AVR  Allergies  Allergen Reactions   Hydrocodone-Acetaminophen Shortness Of Breath   Oxycodone Hcl Shortness Of Breath   Telmisartan-Hctz Shortness Of Breath, Palpitations and Other (See Comments)    Dizziness also   Ramipril Other (See Comments)    Unknown reaction   Aspirin Rash and Other (See Comments)    Confusion also Cannot take high dose   Atorvastatin Other (See Comments)    myalgia   Buspirone Hcl Other (See Comments)    headache   Buspirone Hcl Other (See Comments)    headache   Codeine Hives, Nausea And Vomiting and Other (See Comments)    Confusion also   Morphine Sulfate Itching   Paroxetine Other (See Comments)    Paresthesias: R arm, R leg, r side of face   Rabeprazole Other (See Comments)    headache    Patient Measurements: Height: '5\' 10"'$  (177.8 cm) Weight: 87.5 kg (193 lb) IBW/kg (Calculated) : 73 Heparin Dosing Weight:  Vital Signs: Temp: 100.3 F (37.9 C) (08/30 0318) Temp Source: Oral (08/30 0318) BP: 121/81 (08/30 0318) Pulse Rate: 79 (08/30 0318)  Labs: Recent Labs    07/09/21 0708 07/10/21 0521 07/10/21 1100  HGB 11.1* 11.0*  --   HCT 33.7* 34.0*  --   PLT 76* 77*  --   LABPROT 29.3*  --  25.3*  INR 2.8*  --  2.3*  CREATININE 1.99* 1.47*  --      Estimated Creatinine Clearance: 44.8 mL/min (A) (by C-G formula based on SCr of 1.47 mg/dL (H)).   Medical History: Past Medical History:  Diagnosis Date   Anticoagulants causing adverse effect in therapeutic use    Anxiety    Paxil seemed to cause odd neuro side effects; better on prozac as of 09/2015   Aortic regurgitation 04/2007 surgery   Resolved with tissue AVR at [facility]   Cervical spondylosis    ESI by [name] in [place]. 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 (most recent 07/2017)   History of  prostate cancer 2003   Rad prost   History of pulmonary embolism 2011; 10/2014   Recurrence off of anticoagulation 10/2014: per hem/onc ([name]) pt needs lifelong anticoagulation (hypercoag w/u neg).   Hyperlipidemia    statin-intolerant, except for crestor low dose.   Hypertension    Migraine syndrome    Mitral regurgitation    PAF (paroxysmal atrial fibrillation) (HCC)    Panic attacks    "           "             "                  "                  "                       "                             "                          Pulmonary nodule, right 2015   RLL: resected at [facility] --VATS;  Benign RLL nodule (fungal and AFB stains neg).  Plan is for [facility] thoracic surg to do f/u o/v & CT chest 1 yr    Right-sided headache 07/2016   Primary stabbing HA or migraine per neuro (Dr. Tomi Likens).  Gabapentin and topamax not tolerated.  These resolved spontaneously.    Medications:  Scheduled:   cholecalciferol  2,000 Units Oral q morning   filgrastim  480 mcg Subcutaneous Daily   hydrochlorothiazide  12.5 mg Oral Daily   meclizine  25 mg Oral q morning   omeprazole  40 mg Oral QPM   rosuvastatin  10 mg Oral QHS   sertraline  100 mg Oral q morning   Warfarin - Pharmacist Dosing Inpatient   Does not apply q1600   Infusions:     Assessment: 69 yoM admitted on 8/29 for fevers, neutropenia, hx of CLL.  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  Pharmacy is consulted to resume warfarin PTA Warfarin 7.5 mg daily except '5mg'$  on Mondays.  Last dose taken on 8/28 at 1830.   Admit INR 2.8 CBC:  Hgb 11.1 (baseline ~11.5-12), Plt 76 k (baseline 76-142k) Drug-drug interactions: PTA Venetoclax may increase the serum concentration of Warfarin Diet: regular  07/10/21 INR therapeutic, 2.'5mg'$  dose given 8/29 CBC: Hgb 11 stable, Plts 77 stable No reported bleeding   Goal of Therapy:  INR 2-3 Monitor platelets by anticoagulation protocol: Yes   Plan:  Warfarin '5mg'$  today at 1600 Daily  PT/INR. Monitor for signs and symptoms of bleeding.   Adrian Saran, PharmD, BCPS Secure Chat if ?s 07/10/2021 12:43 PM

## 2021-07-10 NOTE — Progress Notes (Signed)
   07/10/21 1605  Assess: MEWS Score  Temp (!) 102.6 F (39.2 C) (rn aware)  BP 120/76  Pulse Rate 80  Level of Consciousness Alert  SpO2 97 %  Assess: MEWS Score  MEWS Temp 2  MEWS Systolic 0  MEWS Pulse 0  MEWS RR 0  MEWS LOC 0  MEWS Score 2  MEWS Score Color Yellow  Assess: if the MEWS score is Yellow or Red  Were vital signs taken at a resting state? Yes  Focused Assessment No change from prior assessment  Does the patient meet 2 or more of the SIRS criteria? Yes  Does the patient have a confirmed or suspected source of infection? Yes  Provider and Rapid Response Notified? Yes  MEWS guidelines implemented *See Row Information* Yes  Treat  MEWS Interventions Escalated (See documentation below)  Pain Scale 0-10  Pain Score 0  Take Vital Signs  Increase Vital Sign Frequency  Yellow: Q 2hr X 2 then Q 4hr X 2, if remains yellow, continue Q 4hrs  Escalate  MEWS: Escalate Yellow: discuss with charge nurse/RN and consider discussing with provider and RRT  Notify: Charge Nurse/RN  Name of Charge Nurse/RN Notified Pamala Hurry, RN  Date Charge Nurse/RN Notified 07/10/21  Time Charge Nurse/RN Notified 39  Notify: Provider  Provider Name/Title Maylene Roes, MD  Date Provider Notified 07/10/21  Notification Type Page  Notification Reason Other (Comment) (yellow MEWS)  Provider response No new orders;Other (Comment) (Tylenol given)  Date of Provider Response 07/10/21  Time of Provider Response 1650  Document  Patient Outcome Stabilized after interventions  Assess: SIRS CRITERIA  SIRS Temperature  1  SIRS Pulse 0  SIRS Respirations  0  SIRS WBC 1  SIRS Score Sum  2  Pt in yellow MEWS. Agricultural consultant notified. MD notified. Yellow MEWS protocol implement. Pt received Tylenol for temperature.

## 2021-07-10 NOTE — Consult Note (Signed)
Referral MD  Reason for Referral: CLL -- Staph bacteremia -- Neutropenia  Chief Complaint  Patient presents with   Fever  : I had another temperature.  HPI: [name] is a very nice 75-year-old African-American male.  He is well-known to me.  He has a long history of CLL.  He currently have on therapy with venetoclax.  He had been on combination therapy with acalabrutinib and venetoclax.  However, his blood counts were not tolerant of both agents.  I saw him on 07/05/2021.  I decided just to have 1 single agent venetoclax since his bone marrow looked so good and he had minimal residual disease.  He subsequently ended up in the emergency room over the weekend.  He was found to have a positive blood culture for Staph hominis.  He has been admitted.  He is on oral vancomycin.  When he was admitted, his white cell count was 1.9.  Hemoglobin 11.1.  Platelet count 76,000.  His ANC was 1100.  He has BUN of 28 creatinine 1.99.  Today, the BUN is 22 creatinine 1.47.  Today, his white cell count is 1.8.  Hemoglobin 11.  Platelet count 77,000.  It is no surprise that his white cell count platelet count has dropped a little bit with his infection.  He does have a prosthetic aortic valve.  He may need to have a TEE to make sure there is no vegetation on the valve.  Because of his aortic valve prosthesis, I clearly would treat this aggressively.  He has had no rashes.  There is been no nausea or vomiting.  He has had no diarrhea.  There is been no cough or shortness of breath.  He had a chest x-ray on 07/08/2021.  This was unremarkable.  Overall, his performance status is ECOG 1.    Past Medical History:  Diagnosis Date   Anticoagulants causing adverse effect in therapeutic use    Anxiety    Paxil seemed to cause odd neuro side effects; better on prozac as of 09/2015   Aortic regurgitation 04/2007 surgery   Resolved with tissue AVR at [facility]   Cervical spondylosis    [place]. [place]  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  (most recent 07/2017)   History of prostate cancer 2003   Rad prost   History of pulmonary embolism 2011; 10/2014   Recurrence off of anticoagulation 10/2014: per hem/onc (Dr. ) pt needs lifelong anticoagulation (hypercoag w/u neg).   Hyperlipidemia    statin-intolerant, except for crestor low dose.   Hypertension    Migraine syndrome    Mitral regurgitation    PAF (paroxysmal atrial fibrillation) (HCC)    Panic attacks    "           "             "                  "                  "                       "                             "                            Pulmonary nodule, right 2015   RLL: resected at [facility] --VATS; Benign RLL nodule (fungal and AFB stains neg).  Plan is for [facility] thoracic surg to do f/u o/v & CT chest 1 yr    Right-sided headache 07/2016   Primary stabbing HA or migraine per neuro (Dr. Jaffe).  Gabapentin and topamax not tolerated.  These resolved spontaneously.  :   Past Surgical History:  Procedure Laterality Date   AORTIC VALVE REPLACEMENT  2011   [facility]  (bioprosthetic)   CARDIAC CATHETERIZATION  04/2010   Normal coronaries.   Carotid dopplers  08/2015   NORMAL   CATARACT EXTRACTION     L 12/06/09   R 09/14/09   COLONOSCOPY  01/26/13   tic's, o/w normal.  ([facility])--recall 10 yrs.   PENILE PROSTHESIS IMPLANT     PROSTATECTOMY  04/2002   for prostate cancer   Pulmonary nodule resection  Fall/winter 2016   [facility]   SHOULDER SURGERY     rotator cuff on both arms    TIBIALIS TENDON TRANSFER / REPAIR Right 03/02/2019   Procedure: RECONSTRUCTION OF RIGHT  ANTERIOR TIBIALIS TENDON;  Surgeon: Adair, Christopher R, MD;  Location: [facility];  Service: Orthopedics;  Laterality: Right;   TRANSTHORACIC ECHOCARDIOGRAM  09/20/2014; 03/2016; 03/2017   2015-mild LVH, EF 50-55%, wall motion nl, grade I diast dysfxn, prosth  aort valve good,transaortic gradients decreased compared to echo 11/2013.   03/2016: EF 55-60%, normal LV function, severe LVH, normal diast fxn, mild increase in AV gradient compared to 09/2014 echo.  2018: EF 55-60%, normal LV fxn, grd II DD, AV stable/gradient's stable.  :   Current Facility-Administered Medications:    acetaminophen (TYLENOL) tablet 650 mg, 650 mg, Oral, Q6H PRN, 650 mg at 07/09/21 2131 **OR** acetaminophen (TYLENOL) suppository 650 mg, 650 mg, Rectal, Q6H PRN, Garing, Kendall, MD   bisacodyl (DULCOLAX) EC tablet 5 mg, 5 mg, Oral, Daily PRN, Garing, Kendall, MD   ceFAZolin (ANCEF) IVPB 2g/100 mL premix, 2 g, Intravenous, Q8H, Garing, Kendall, MD, Last Rate: 200 mL/hr at 07/10/21 0555, 2 g at 07/10/21 0555   cholecalciferol (VITAMIN D3) tablet 2,000 Units, 2,000 Units, Oral, q morning, Garing, Kendall, MD   filgrastim (NEUPOGEN) injection 480 mcg, 480 mcg, Subcutaneous, Daily, ,  R, MD   ipratropium (ATROVENT) 0.03 % nasal spray 2 spray, 2 spray, Each Nare, BID PRN, Garing, Kendall, MD   meclizine (ANTIVERT) tablet 25 mg, 25 mg, Oral, q morning, Garing, Kendall, MD   metoprolol tartrate (LOPRESSOR) injection 5 mg, 5 mg, Intravenous, Q6H PRN, Garing, Kendall, MD   omeprazole (PRILOSEC) capsule 40 mg, 40 mg, Oral, QPM, Garing, Kendall, MD, 40 mg at 07/09/21 2226   ondansetron (ZOFRAN) tablet 4 mg, 4 mg, Oral, Q6H PRN **OR** ondansetron (ZOFRAN) injection 4 mg, 4 mg, Intravenous, Q6H PRN, Garing, Kendall, MD   rosuvastatin (CRESTOR) tablet 10 mg, 10 mg, Oral, QHS, Garing, Kendall, MD, 10 mg at 07/09/21 2131   senna-docusate (Senokot-S) tablet 1 tablet, 1 tablet, Oral, QHS PRN, Garing, Kendall, MD   sertraline (ZOLOFT) tablet 100 mg, 100 mg, Oral, q morning, Garing, Kendall, MD   Warfarin - Pharmacist Dosing Inpatient, , Does not apply, q1600, Shade, Christine E, RPH:   cholecalciferol  2,000 Units Oral q morning   filgrastim  480 mcg Subcutaneous Daily   meclizine  25  mg Oral q morning   omeprazole  40 mg Oral QPM   rosuvastatin  10 mg Oral QHS   sertraline  100 mg   Oral q morning   Warfarin - Pharmacist Dosing Inpatient   Does not apply q1600  :   Allergies  Allergen Reactions   Hydrocodone-Acetaminophen Shortness Of Breath   Oxycodone Hcl Shortness Of Breath   Telmisartan-Hctz Shortness Of Breath, Palpitations and Other (See Comments)    Dizziness also   Ramipril Other (See Comments)    Unknown reaction   Aspirin Rash and Other (See Comments)    Confusion also Cannot take high dose   Atorvastatin Other (See Comments)    myalgia   Buspirone Hcl Other (See Comments)    headache   Buspirone Hcl Other (See Comments)    headache   Codeine Hives, Nausea And Vomiting and Other (See Comments)    Confusion also   Morphine Sulfate Itching   Paroxetine Other (See Comments)    Paresthesias: R arm, R leg, r side of face   Rabeprazole Other (See Comments)    headache  :   Family History  Problem Relation Age of Onset   Cancer Sister        uterine   Colon cancer Sister 76   Stroke Mother    Prostate cancer Father    Stroke Sister    Stroke Brother    Heart attack Neg Hx   :   Social History   Socioeconomic History   Marital status: Single    Spouse name: Not on file   Number of children: Not on file   Years of education: Not on file   Highest education level: Not on file  Occupational History    Comment: Retired - Tobacco   Tobacco Use   Smoking status: Former    Packs/day: 1.00    Years: 12.00    Pack years: 12.00    Types: Cigarettes    Start date: 11/28/1968    Quit date: 01/29/1981    Years since quitting: 40.4   Smokeless tobacco: Never   Tobacco comments:    quit 75 yo   Vaping Use   Vaping Use: Never used  Substance and Sexual Activity   Alcohol use: Not Currently    Comment: occasional   Drug use: No   Sexual activity: Not on file  Other Topics Concern   Not on file  Social History Narrative   Single. No  children. 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       Hobbies: travel, Tai-chi instructor, enjoys being outdoors including hiking   Social Determinants of Health   Financial Resource Strain: Low Risk    Difficulty of Paying Living Expenses: Not hard at all  Food Insecurity: No Food Insecurity   Worried About Running Out of Food in the Last Year: Never true   Ran Out of Food in the Last Year: Never true  Transportation Needs: No Transportation Needs   Lack of Transportation (Medical): No   Lack of Transportation (Non-Medical): No  Physical Activity: Sufficiently Active   Days of Exercise per Week: 5 days   Minutes of Exercise per Session: 60 min  Stress: No Stress Concern Present   Feeling of Stress : Only a little  Social Connections: Socially Isolated   Frequency of Communication with Friends and Family: More than three times a week   Frequency of Social Gatherings with Friends and Family: More than three times a week   Attends Religious Services: Never   Active Member of Clubs or Organizations: No   Attends Club or Organization Meetings: Never     Marital Status: Never married  Human resources officer Violence: Not At Risk   Fear of Current or Ex-Partner: No   Emotionally Abused: No   Physically Abused: No   Sexually Abused: No  :  Review of Systems  Constitutional:  Positive for chills, diaphoresis and fever.  HENT: Negative.    Eyes: Negative.   Respiratory: Negative.    Cardiovascular: Negative.   Gastrointestinal: Negative.   Genitourinary: Negative.  Negative for dysuria.  Musculoskeletal: Negative.   Skin: Negative.   Neurological: Negative.   Endo/Heme/Allergies: Negative.   Psychiatric/Behavioral: Negative.      Exam:  Physical Exam Vitals reviewed.  HENT:     Head: Normocephalic and atraumatic.  Eyes:     Pupils: Pupils are equal, round, and reactive to light.  Cardiovascular:     Rate and Rhythm: Normal rate and regular rhythm.      Heart sounds: Normal heart sounds.  Pulmonary:     Effort: Pulmonary effort is normal.     Breath sounds: Normal breath sounds.  Abdominal:     General: Bowel sounds are normal.     Palpations: Abdomen is soft.  Musculoskeletal:        General: No tenderness or deformity. Normal range of motion.     Cervical back: Normal range of motion.  Lymphadenopathy:     Cervical: No cervical adenopathy.  Skin:    General: Skin is warm and dry.     Findings: No erythema or rash.  Neurological:     Mental Status: He is alert and oriented to person, place, and time.  Psychiatric:        Behavior: Behavior normal.        Thought Content: Thought content normal.        Judgment: Judgment normal.    Patient Vitals for the past 24 hrs:  BP Temp Temp src Pulse Resp SpO2  07/10/21 0318 121/81 100.3 F (37.9 C) Oral 79 18 99 %  07/09/21 2313 120/65 (!) 100.7 F (38.2 C) Oral 79 15 95 %  07/09/21 2044 121/72 100 F (37.8 C) Oral 78 19 99 %  07/09/21 1515 118/60 99.3 F (37.4 C) Oral 85 20 95 %  07/09/21 1415 -- -- -- 89 20 96 %  07/09/21 1400 138/74 -- -- 89 (!) 23 95 %  07/09/21 1300 122/70 (!) 101.6 F (38.7 C) Oral 92 15 96 %  07/09/21 1200 136/66 -- -- 76 (!) 21 97 %  07/09/21 0815 (!) 92/52 -- -- 71 (!) 24 99 %      Recent Labs    07/09/21 0708 07/10/21 0521  WBC 1.9* 1.8*  HGB 11.1* 11.0*  HCT 33.7* 34.0*  PLT 76* 77*    Recent Labs    07/09/21 0708 07/10/21 0521  NA 134* 135  K 3.7 3.6  CL 100 102  CO2 25 25  GLUCOSE 114* 105*  BUN 28* 22  CREATININE 1.99* 1.47*  CALCIUM 8.8* 8.8*    Blood smear review: None  Pathology: None    Assessment and Plan: [name] is a very nice 75 year old African-American male.  He is incredibly active.  He has CLL.  He has had a very nice response to oral therapy.  He has all Staphylococcus his blood.  Just one blood culture was positive.  However, given that he has this prosthetic aortic valve, granted it is a tissue  valve, I think we have to make sure that there is no issues with respect to  vegetations.  I suppose a echocardiogram would be reasonable to start.  We will see what the sensitivities are with the Staphylococcus.  I will put him on Neupogen.  We really need to get his white cell count up a little bit.  I know that he will get great care from all the staff on 5 E.  I do appreciate everybody's hard work.   Pete , MD  Isaiah 41:10 

## 2021-07-10 NOTE — Consult Note (Signed)
Date of Admission:  07/09/2021          Reason for Consult: Fevers and positive blood culture for Staphylococcus hominis   Referring Provider: Dr Maylene Roes   Assessment:  Fevers and drenching night sweats going back to July of 2022 History of aortic valve replacement Single blood culture positive for Staph hominis from  August 27th 2022 CLL having difficulty tolerating aclabrunitib and venetoclax--now on only latter but no doses since Sunday Pancytopenia History of prostate cancer Chronic kidney disease History of prior pulmonary embolism  Plan:  We will stop cefazolin for now We asked microbiology lab to work-up the Staph hominis Agree with 2D echo which does not show vegetations would pursue transesophageal echocardiogram--though I doubt Cardiology will do with his platelets this low Will obtain CT chest abdomen pelvis and FUO work-up Other FUO labs including HIV antibody, hepatitis panel, EBV panel, CMV panel ESR, CRP, ferritin, LDH, ANA, RF, ehrlichia, RMSF titers (though doubt he has had either this long with pattern of labs and presentation) If workup does NOT pin down a diagnosis we may need to empirically treat him for endocarditis from Staphylococcus hominis. The antibiotic susceptibilities, blood culture data and TEE may help guide this decision making  Principal Problem:   Bacteremia Active Problems:   Essential hypertension   PROSTATE CANCER, HX OF   Chronic lymphocytic leukemia (HCC)   S/P Bioprosthetic AVR (aortic valve replacement) in 2011   CKD (chronic kidney disease), stage III (HCC)   History of pulmonary embolism   Scheduled Meds:  cholecalciferol  2,000 Units Oral q morning   filgrastim  480 mcg Subcutaneous Daily   hydrochlorothiazide  12.5 mg Oral Daily   iohexol  500 mL Oral Q1H   iohexol       meclizine  25 mg Oral q morning   omeprazole  40 mg Oral QPM   rosuvastatin  10 mg Oral QHS   sertraline  100 mg Oral q morning   warfarin  5 mg Oral  ONCE-1600   Warfarin - Pharmacist Dosing Inpatient   Does not apply q1600   Continuous Infusions: PRN Meds:.acetaminophen **OR** acetaminophen, bisacodyl, ipratropium, LORazepam, metoprolol tartrate, ondansetron **OR** ondansetron (ZOFRAN) IV, senna-docusate  HPI: [name] is a 75 y.o. male with past medical history significant for CLL on aclabrunitib and venetoclax- but now on latter alone, hx of valve replacement with aortic stenosis, chronic kidney disease pulmonary embolism who presents with more than a month if not longer history of fevers of unknown origin with accompanying drenching night sweats malaise and rigors along with body aches.  He appears to have first been seen in the ER on July 19 with similar symptoms and had an isolated blood culture done which did not reveal an organism.  Yet again on 8 August he had was seen and had blood cultures again done this time for 2 sites which neither yielded an organism.  More recently on July 07, 2021 he again had a blood culture taken unfortunately from only 1 site.  This 1 yielded a Staph hominis which was not worked up by the microbiology lab because the thought it was likely a contaminant.  And unfortunately there were not 2 nights sample to confirm that this was a contaminant or to prove that it was a real pathogen.  On July 19 he had been discharged with a course of cefdinir though without a clear target for antibiotics.  He was again fairly neutropenic. Again on August 8  he was seen cultures taken and again prescribed cefdinir.  Today did not yield an organism again.  The 27th he came in blood cultures were taken again but only from one site. He was YET AGAIN given a 7 day course   positive from 1 site--the only 1 site sampled. of cefdinir.  Called by ER staff and asked to come back to the hospital.  He did not want to be admitted and was then switched to levofloxacin.  Spite being on levofloxacin he again awoke at 2 in the morning with  drenching sweats chills and a fever to 103.  He has been admitted to the hospital and blood cultures have been taken again on the 29th.  He was having no urinary symptoms but his urine was taken for culture as well.  So far blood cultures albeit on levofloxacin have not yielded an organism.  Certainly he is an individual who would be at high risk for having prosthetic valve endocarditis given his prosthetic valve and given the 1 positive blood culture.  That being said he has multiple negative blood cultures and the 1 positive blood culture that was taken did not have an accompanying site to confirm that this was a true positive culture or whether this was a contaminant.  If he truly has Staph hominis bacteremia and endocarditis it may very well not be a methicillin sensitive species and therefore would not likely respond to cefdinir or cefazolin.  The fact that he has had fairly significant neutropenia in the context of treatment of his CLL also confounds ability to understand why he is having such high fevers.  I got a stop his cefazolin at present.  Perhaps repeat blood cultures taken or new ones may be revealing.   The 2D echocardiogram I followed up does not show evidence of endocarditis.  Ideally a TEE should be pursued but I think his platelets would give cardiology pause.  I will order CT chest abdomen pelvis to look for source of fever of unknown origin.  He appears to be getting filgrastim to help boost his neutrophil counts.  The venetoclax can cause fever apparently in minority of patients but he has not taken this since Sunday.  I spent 84 minutes with the patient including greater than 50% of time in face to face counseling of the patient during the work-up for fever of unknown origin, neutropenic fevers interpretation of blood cultures, risk for prosthetic valve endocarditis, personally reviewing his chest x-ray August 28 that does not show any cardiopulmonary disease, his blood  culture data since July his CBC and comprehensive metabolic panel in  review of medical records before and during the visit and in coordination of his care.       Review of Systems: Review of Systems  Constitutional:  Positive for chills, diaphoresis, fever and malaise/fatigue. Negative for weight loss.  HENT:  Negative for congestion, hearing loss and sore throat.   Eyes:  Negative for blurred vision and photophobia.  Respiratory:  Negative for cough, shortness of breath and wheezing.   Cardiovascular:  Negative for chest pain, palpitations and leg swelling.  Gastrointestinal:  Negative for abdominal pain, blood in stool, constipation, diarrhea, heartburn, melena, nausea and vomiting.  Genitourinary:  Negative for dysuria, flank pain and hematuria.  Musculoskeletal:  Positive for myalgias. Negative for back pain, falls and joint pain.  Skin:  Negative for itching and rash.  Neurological:  Negative for dizziness, focal weakness, loss of consciousness, weakness and headaches.  Endo/Heme/Allergies:  Does not bruise/bleed easily.  Psychiatric/Behavioral:  Negative for depression, substance abuse and suicidal ideas. The patient does not have insomnia.    Past Medical History:  Diagnosis Date   Anticoagulants causing adverse effect in therapeutic use    Anxiety    Paxil seemed to cause odd neuro side effects; better on prozac as of 09/2015   Aortic regurgitation 04/2007 surgery   Resolved [facility]R [facility]   Cervical spondylosis    ESI by [name] in [place]. 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 (most recent 07/2017)   History of prostate cancer 2003   Rad prost   History of pulmonary embolism 2011; 10/2014   Recurrence off of anticoagulation 10/2014: per hem/onc ([name]) pt needs lifelong anticoagulation (hypercoag w/u neg).   Hyperlipidemia    statin-intolerant, except for crestor low  dose.   Hypertension    Migraine syndrome    Mitral regurgitation    PAF (paroxysmal atrial fibrillation) (HCC)    Panic attacks    "           "             "                  "                  "                       "                             "                          Pulmonary nodule, right 2015   RLL: resected at [facility] --VATS; Benign RLL nodule (fungal and AFB stains neg).  Plan is for [facility] thoracic surg to do f/u o/v & CT chest 1 yr    Right-sided headache 07/2016   Primary stabbing HA or migraine per neuro (Dr. Tomi Likens).  Gabapentin and topamax not tolerated.  These resolved spontaneously.    Social History   Tobacco Use   Smoking status: Former    Packs/day: 1.00    Years: 12.00    Pack years: 12.00    Types: Cigarettes    Start date: 11/28/1968    Quit date: 01/29/1981    Years since quitting: 40.4   Smokeless tobacco: Never   Tobacco comments:    quit 75 yo   Vaping Use   Vaping Use: Never used  Substance Use Topics   Alcohol use: Not Currently    Comment: occasional   Drug use: No    Family History  Problem Relation Age of Onset   Cancer Sister        uterine   Colon cancer Sister 56   Stroke Mother    Prostate cancer Father    Stroke Sister    Stroke Brother    Heart attack Neg Hx    Allergies  Allergen Reactions   Hydrocodone-Acetaminophen Shortness Of Breath   Oxycodone Hcl Shortness Of Breath   Telmisartan-Hctz Shortness Of Breath, Palpitations and Other (See Comments)    Dizziness also   Ramipril Other (See Comments)    Unknown reaction   Aspirin Rash and Other (  See Comments)    Confusion also Cannot take high dose   Atorvastatin Other (See Comments)    myalgia   Buspirone Hcl Other (See Comments)    headache   Buspirone Hcl Other (See Comments)    headache   Codeine Hives, Nausea And Vomiting and Other (See Comments)    Confusion also   Morphine Sulfate Itching   Paroxetine Other (See Comments)    Paresthesias: R arm, R leg, r side  of face   Rabeprazole Other (See Comments)    headache    OBJECTIVE: Blood pressure 121/81, pulse 79, temperature 100.3 F (37.9 C), temperature source Oral, resp. rate 18, height $RemoveBe'5\' 10"'rycGNFHov$  (1.778 m), weight 87.5 kg, SpO2 99 %.  Physical Exam Constitutional:      Appearance: He is well-developed.  HENT:     Head: Normocephalic and atraumatic.  Eyes:     General:        Right eye: No discharge.        Left eye: No discharge.     Extraocular Movements: Extraocular movements intact.     Conjunctiva/sclera: Conjunctivae normal.  Cardiovascular:     Rate and Rhythm: Normal rate and regular rhythm.     Heart sounds: Murmur heard.  Pulmonary:     Effort: Pulmonary effort is normal. No respiratory distress.     Breath sounds: Normal breath sounds. No stridor. No wheezing or rhonchi.  Abdominal:     General: There is no distension.     Palpations: Abdomen is soft. There is no mass.     Tenderness: There is no abdominal tenderness.     Hernia: No hernia is present.  Musculoskeletal:        General: No swelling, tenderness, deformity or signs of injury. Normal range of motion.     Cervical back: Normal range of motion and neck supple.  Skin:    General: Skin is warm and dry.     Coloration: Skin is not pale.     Findings: No erythema or rash.  Neurological:     General: No focal deficit present.     Mental Status: He is alert and oriented to person, place, and time.  Psychiatric:        Mood and Affect: Mood normal.        Behavior: Behavior normal.        Thought Content: Thought content normal.        Judgment: Judgment normal.    Lab Results Lab Results  Component Value Date   WBC 1.8 (L) 07/10/2021   HGB 11.0 (L) 07/10/2021   HCT 34.0 (L) 07/10/2021   MCV 99.1 07/10/2021   PLT 77 (L) 07/10/2021    Lab Results  Component Value Date   CREATININE 1.47 (H) 07/10/2021   BUN 22 07/10/2021   NA 135 07/10/2021   K 3.6 07/10/2021   CL 102 07/10/2021   CO2 25 07/10/2021     Lab Results  Component Value Date   ALT 16 07/07/2021   AST 22 07/07/2021   ALKPHOS 55 07/07/2021   BILITOT 0.6 07/07/2021     Microbiology: Recent Results (from the past 240 hour(s))  Blood Culture (routine x 2)     Status: Abnormal (Preliminary result)   Collection Time: 07/07/21  6:05 AM   Specimen: BLOOD  Result Value Ref Range Status   Specimen Description   Final    BLOOD BLOOD RIGHT FOREARM Performed at [facility], [address], [place],  [place] [address]    Special Requests   Final    BOTTLES DRAWN AEROBIC AND ANAEROBIC Blood Culture adequate volume Performed at [facility], [place], [place] [address]    Culture  Setup Time   Final    GRAM POSITIVE COCCI IN CLUSTERS ANAEROBIC BOTTLE ONLY CRITICAL RESULT CALLED TO, READ BACK BY AND VERIFIED WITH: Gary RN $Remov'[email or web address]$  07/08/21 EB    Culture (A)  Final    STAPHYLOCOCCUS HOMINIS SUSCEPTIBILITIES TO FOLLOW Performed at [facility], [address], [place], [place] [address]    Report Status PENDING  Incomplete  Resp Panel by RT-PCR (Flu A&B, Covid) Peripheral     Status: None   Collection Time: 07/07/21  6:05 AM   Specimen: Peripheral; Nasopharyngeal(NP) swabs in vial transport medium  Result Value Ref Range Status   SARS Coronavirus 2 by RT PCR NEGATIVE NEGATIVE Final    Comment: (NOTE) SARS-CoV-2 target nucleic acids are NOT DETECTED.  The SARS-CoV-2 RNA is generally detectable in upper respiratory specimens during the acute phase of infection. The lowest concentration of SARS-CoV-2 viral copies this assay can detect is 138 copies/mL. A negative result does not preclude SARS-Cov-2 infection and should not be used as the sole basis for treatment or other patient management decisions. A negative result may occur with  improper specimen collection/handling, submission of specimen other than nasopharyngeal swab, presence of viral mutation(s) within  the areas targeted by this assay, and inadequate number of viral copies(<138 copies/mL). A negative result must be combined with clinical observations, patient history, and epidemiological information. The expected result is Negative.  Fact Sheet for Patients:  EntrepreneurPulse.com.au  Fact Sheet for Healthcare Providers:  IncredibleEmployment.be  This test is no t yet approved or cleared by the [place] FDA and  has been authorized for detection and/or diagnosis of SARS-CoV-2 by FDA under an Emergency Use Authorization (EUA). This EUA will remain  in effect (meaning this test can be used) for the duration of the COVID-19 declaration under Section 564(b)(1) of the Act, 21 U.S.C.section 360bbb-3(b)(1), unless the authorization is terminated  or revoked sooner.       Influenza A by PCR NEGATIVE NEGATIVE Final   Influenza B by PCR NEGATIVE NEGATIVE Final    Comment: (NOTE) The Xpert Xpress SARS-CoV-2/FLU/RSV plus assay is intended as an aid in the diagnosis of influenza from Nasopharyngeal swab specimens and should not be used as a sole basis for treatment. Nasal washings and aspirates are unacceptable for Xpert Xpress SARS-CoV-2/FLU/RSV testing.  Fact Sheet for Patients: EntrepreneurPulse.com.au  Fact Sheet for Healthcare Providers: IncredibleEmployment.be  This test is not yet approved or cleared by the [place] FDA and has been authorized for detection and/or diagnosis of SARS-CoV-2 by FDA under an Emergency Use Authorization (EUA). This EUA will remain in effect (meaning this test can be used) for the duration of the COVID-19 declaration under Section 564(b)(1) of the Act, 21 U.S.C. section 360bbb-3(b)(1), unless the authorization is terminated or revoked.  Performed at [facility], [address], [place],  [address]   Blood Culture ID Panel (Reflexed)      Status: Abnormal   Collection Time: 07/07/21  6:05 AM  Result Value Ref Range Status   Enterococcus faecalis NOT DETECTED NOT DETECTED Final   Enterococcus Faecium NOT DETECTED NOT DETECTED Final   Listeria monocytogenes NOT DETECTED NOT DETECTED Final   Staphylococcus species DETECTED (A) NOT DETECTED Final    Comment: CRITICAL RESULT CALLED  TO, READ BACK BY AND VERIFIED WITH: Barnes RN $[email or web address]$  07/08/21 EB    Staphylococcus aureus (BCID) NOT DETECTED NOT DETECTED Final   Staphylococcus epidermidis NOT DETECTED NOT DETECTED Final   Staphylococcus lugdunensis NOT DETECTED NOT DETECTED Final   Streptococcus species NOT DETECTED NOT DETECTED Final   Streptococcus agalactiae NOT DETECTED NOT DETECTED Final   Streptococcus pneumoniae NOT DETECTED NOT DETECTED Final   Streptococcus pyogenes NOT DETECTED NOT DETECTED Final   A.calcoaceticus-baumannii NOT DETECTED NOT DETECTED Final   Bacteroides fragilis NOT DETECTED NOT DETECTED Final   Enterobacterales NOT DETECTED NOT DETECTED Final   Enterobacter cloacae complex NOT DETECTED NOT DETECTED Final   Escherichia coli NOT DETECTED NOT DETECTED Final   Klebsiella aerogenes NOT DETECTED NOT DETECTED Final   Klebsiella oxytoca NOT DETECTED NOT DETECTED Final   Klebsiella pneumoniae NOT DETECTED NOT DETECTED Final   Proteus species NOT DETECTED NOT DETECTED Final   Salmonella species NOT DETECTED NOT DETECTED Final   Serratia marcescens NOT DETECTED NOT DETECTED Final   Haemophilus influenzae NOT DETECTED NOT DETECTED Final   Neisseria meningitidis NOT DETECTED NOT DETECTED Final   Pseudomonas aeruginosa NOT DETECTED NOT DETECTED Final   Stenotrophomonas maltophilia NOT DETECTED NOT DETECTED Final   Candida albicans NOT DETECTED NOT DETECTED Final   Candida auris NOT DETECTED NOT DETECTED Final   Candida glabrata NOT DETECTED NOT DETECTED Final   Candida krusei NOT DETECTED NOT DETECTED Final   Candida parapsilosis NOT DETECTED NOT DETECTED  Final   Candida tropicalis NOT DETECTED NOT DETECTED Final   Cryptococcus neoformans/gattii NOT DETECTED NOT DETECTED Final    Comment: Performed at [facility], [address], [place], [place] [address]  MRSA Next Gen by PCR, Nasal     Status: None   Collection Time: 07/08/21  3:27 PM   Specimen: Nasal Mucosa; Nasal Swab  Result Value Ref Range Status   MRSA by PCR Next Gen NOT DETECTED NOT DETECTED Final    Comment: (NOTE) The GeneXpert MRSA Assay (FDA approved for NASAL specimens only), is one component of a comprehensive MRSA colonization surveillance program. It is not intended to diagnose MRSA infection nor to guide or monitor treatment for MRSA infections. Test performance is not FDA approved in patients less than 50 years old. Performed at [facility], [place] [address] [place], [place] [address]   Urine Culture     Status: None   Collection Time: 07/09/21  6:48 AM   Specimen: Urine, Clean Catch  Result Value Ref Range Status   Specimen Description   Final    URINE, CLEAN CATCH Performed at [facility], [address] [place], [place] [address]    Special Requests   Final    NONE Performed at [facility], [address] [place], [place] [address]    Culture   Final    NO GROWTH Performed at [facility], [place] [address], [place], [place] [address]    Report Status 07/10/2021 FINAL  Final  Blood culture (routine x 2)     Status: None (Preliminary result)   Collection Time: 07/09/21  7:08 AM   Specimen: BLOOD  Result Value Ref Range Status   Specimen Description   Final    BLOOD BLOOD RIGHT FOREARM Performed at [facility], [address], [place], [place] [address]    Special Requests   Final    BOTTLES DRAWN AEROBIC AND ANAEROBIC Blood Culture adequate volume Performed at  [facility], [address], [place], [place] [address]    Culture   Final     NO GROWTH < 24 HOURS Performed at [facility], [place] [address] [place], [place] [address]    Report Status PENDING  Incomplete  Resp Panel by RT-PCR (Flu A&B, Covid) Nasopharyngeal Swab     Status: None   Collection Time: 07/09/21  7:29 AM   Specimen: Nasopharyngeal Swab; Nasopharyngeal(NP) swabs in vial transport medium  Result Value Ref Range Status   SARS Coronavirus 2 by RT PCR NEGATIVE NEGATIVE Final    Comment: (NOTE) SARS-CoV-2 target nucleic acids are NOT DETECTED.  The SARS-CoV-2 RNA is generally detectable in upper respiratory specimens during the acute phase of infection. The lowest concentration of SARS-CoV-2 viral copies this assay can detect is 138 copies/mL. A negative result does not preclude SARS-Cov-2 infection and should not be used as the sole basis for treatment or other patient management decisions. A negative result may occur with  improper specimen collection/handling, submission of specimen other than nasopharyngeal swab, presence of viral mutation(s) within the areas targeted by this assay, and inadequate number of viral copies(<138 copies/mL). A negative result must be combined with clinical observations, patient history, and epidemiological information. The expected result is Negative.  Fact Sheet for Patients:  EntrepreneurPulse.com.au  Fact Sheet for Healthcare Providers:  IncredibleEmployment.be  This test is no t yet approved or cleared by the [place] FDA and  has been authorized for detection and/or diagnosis of SARS-CoV-2 by FDA under an Emergency Use Authorization (EUA). This EUA will remain  in effect (meaning this test can be used) for the duration of the COVID-19 declaration under Section 564(b)(1) of the Act, 21 U.S.C.section 360bbb-3(b)(1), unless the authorization is terminated  or revoked sooner.       Influenza A by PCR NEGATIVE NEGATIVE Final   Influenza B by PCR NEGATIVE NEGATIVE  Final    Comment: (NOTE) The Xpert Xpress SARS-CoV-2/FLU/RSV plus assay is intended as an aid in the diagnosis of influenza from Nasopharyngeal swab specimens and should not be used as a sole basis for treatment. Nasal washings and aspirates are unacceptable for Xpert Xpress SARS-CoV-2/FLU/RSV testing.  Fact Sheet for Patients: EntrepreneurPulse.com.au  Fact Sheet for Healthcare Providers: IncredibleEmployment.be  This test is not yet approved or cleared by the [place] FDA and has been authorized for detection and/or diagnosis of SARS-CoV-2 by FDA under an Emergency Use Authorization (EUA). This EUA will remain in effect (meaning this test can be used) for the duration of the COVID-19 declaration under Section 564(b)(1) of the Act, 21 U.S.C. section 360bbb-3(b)(1), unless the authorization is terminated or revoked.  Performed at [facility], [address] [place], [place] [address]   Blood culture (routine x 2)     Status: None (Preliminary result)   Collection Time: 07/09/21  7:30 AM   Specimen: BLOOD LEFT HAND  Result Value Ref Range Status   Specimen Description   Final    BLOOD LEFT HAND Performed at [facility], [address], [place], [place] [address]    Special Requests   Final    Blood Culture adequate volume Performed at [facility], [address] [place], [place] [address]    Culture   Final    NO GROWTH < 24 HOURS Performed at [facility], [place] [address], [place],  [address]    Report Status PENDING  Incomplete    Alcide Evener,  [place] [place] [phone] pager  07/10/2021, 2:17 PM

## 2021-07-11 DIAGNOSIS — J189 Pneumonia, unspecified organism: Secondary | ICD-10-CM

## 2021-07-11 DIAGNOSIS — N183 Chronic kidney disease, stage 3 unspecified: Secondary | ICD-10-CM | POA: Diagnosis not present

## 2021-07-11 DIAGNOSIS — R7881 Bacteremia: Principal | ICD-10-CM

## 2021-07-11 DIAGNOSIS — D61818 Other pancytopenia: Secondary | ICD-10-CM

## 2021-07-11 DIAGNOSIS — R509 Fever, unspecified: Secondary | ICD-10-CM | POA: Diagnosis not present

## 2021-07-11 DIAGNOSIS — Z86711 Personal history of pulmonary embolism: Secondary | ICD-10-CM

## 2021-07-11 DIAGNOSIS — I10 Essential (primary) hypertension: Secondary | ICD-10-CM

## 2021-07-11 DIAGNOSIS — D709 Neutropenia, unspecified: Secondary | ICD-10-CM

## 2021-07-11 DIAGNOSIS — Z952 Presence of prosthetic heart valve: Secondary | ICD-10-CM | POA: Diagnosis not present

## 2021-07-11 DIAGNOSIS — R5081 Fever presenting with conditions classified elsewhere: Secondary | ICD-10-CM

## 2021-07-11 DIAGNOSIS — C9110 Chronic lymphocytic leukemia of B-cell type not having achieved remission: Secondary | ICD-10-CM | POA: Diagnosis not present

## 2021-07-11 LAB — BASIC METABOLIC PANEL
Anion gap: 8 (ref 5–15)
BUN: 21 mg/dL (ref 8–23)
CO2: 28 mmol/L (ref 22–32)
Calcium: 9.1 mg/dL (ref 8.9–10.3)
Chloride: 103 mmol/L (ref 98–111)
Creatinine, Ser: 1.49 mg/dL — ABNORMAL HIGH (ref 0.61–1.24)
GFR, Estimated: 49 mL/min — ABNORMAL LOW (ref >60)
Glucose, Bld: 103 mg/dL — ABNORMAL HIGH (ref 70–99)
Potassium: 3.8 mmol/L (ref 3.5–5.1)
Sodium: 139 mmol/L (ref 135–145)

## 2021-07-11 LAB — CBC WITH DIFFERENTIAL/PLATELET
Abs Immature Granulocytes: 0.04 10*3/uL (ref 0.00–0.07)
Basophils Absolute: 0 10*3/uL (ref 0.0–0.1)
Basophils Relative: 0 %
Eosinophils Absolute: 0 10*3/uL (ref 0.0–0.5)
Eosinophils Relative: 0 %
HCT: 32.7 % — ABNORMAL LOW (ref 39.0–52.0)
Hemoglobin: 10.9 g/dL — ABNORMAL LOW (ref 13.0–17.0)
Immature Granulocytes: 1 %
Lymphocytes Relative: 4 %
Lymphs Abs: 0.3 10*3/uL — ABNORMAL LOW (ref 0.7–4.0)
MCH: 32.2 pg (ref 26.0–34.0)
MCHC: 33.3 g/dL (ref 30.0–36.0)
MCV: 96.5 fL (ref 80.0–100.0)
Monocytes Absolute: 0.4 10*3/uL (ref 0.1–1.0)
Monocytes Relative: 6 %
Neutro Abs: 6 10*3/uL (ref 1.7–7.7)
Neutrophils Relative %: 89 %
Platelets: 69 10*3/uL — ABNORMAL LOW (ref 150–400)
RBC: 3.39 MIL/uL — ABNORMAL LOW (ref 4.22–5.81)
RDW: 13.5 % (ref 11.5–15.5)
WBC: 6.8 10*3/uL (ref 4.0–10.5)
nRBC: 0 % (ref 0.0–0.2)

## 2021-07-11 LAB — PROCALCITONIN: Procalcitonin: 0.15 ng/mL

## 2021-07-11 LAB — CULTURE, BLOOD (ROUTINE X 2): Special Requests: ADEQUATE

## 2021-07-11 LAB — PROTIME-INR
INR: 1.9 — ABNORMAL HIGH (ref 0.8–1.2)
Prothrombin Time: 21.9 seconds — ABNORMAL HIGH (ref 11.4–15.2)

## 2021-07-11 LAB — C-REACTIVE PROTEIN: CRP: 15.8 mg/dL — ABNORMAL HIGH (ref <1.0)

## 2021-07-11 LAB — SEDIMENTATION RATE: Sed Rate: 36 mm/hr — ABNORMAL HIGH (ref 0–16)

## 2021-07-11 LAB — FERRITIN: Ferritin: 365 ng/mL — ABNORMAL HIGH (ref 24–336)

## 2021-07-11 LAB — CK: Total CK: 73 U/L (ref 49–397)

## 2021-07-11 LAB — LACTATE DEHYDROGENASE: LDH: 310 U/L — ABNORMAL HIGH (ref 98–192)

## 2021-07-11 MED ORDER — ENOXAPARIN SODIUM 100 MG/ML IJ SOSY
1.0000 mg/kg | PREFILLED_SYRINGE | Freq: Two times a day (BID) | INTRAMUSCULAR | Status: AC
  Administered 2021-07-11 – 2021-07-12 (×3): 90 mg via SUBCUTANEOUS
  Filled 2021-07-11 (×3): qty 1

## 2021-07-11 MED ORDER — AMOXICILLIN-POT CLAVULANATE 875-125 MG PO TABS
1.0000 | ORAL_TABLET | Freq: Two times a day (BID) | ORAL | Status: DC
  Administered 2021-07-11 – 2021-07-12 (×4): 1 via ORAL
  Filled 2021-07-11 (×5): qty 1

## 2021-07-11 NOTE — Progress Notes (Signed)
   07/11/21 1238  Assess: MEWS Score  Temp (!) 102.8 F (39.3 C)  BP 110/70  Pulse Rate 78  Resp 20  Level of Consciousness Alert  SpO2 91 %  O2 Device Room Air  Patient Activity (if Appropriate) In bed  Assess: MEWS Score  MEWS Temp 2  MEWS Systolic 0  MEWS Pulse 0  MEWS RR 0  MEWS LOC 0  MEWS Score 2  MEWS Score Color Yellow  Assess: if the MEWS score is Yellow or Red  Were vital signs taken at a resting state? Yes  Focused Assessment No change from prior assessment  Does the patient meet 2 or more of the SIRS criteria? No  Does the patient have a confirmed or suspected source of infection? Yes  Provider and Rapid Response Notified? Yes  MEWS guidelines implemented *See Row Information* Yes  Treat  MEWS Interventions Escalated (See documentation below)  Pain Scale 0-10  Pain Score 0  Take Vital Signs  Increase Vital Sign Frequency  Yellow: Q 2hr X 2 then Q 4hr X 2, if remains yellow, continue Q 4hrs  Escalate  MEWS: Escalate Yellow: discuss with charge nurse/RN and consider discussing with provider and RRT  Notify: Charge Nurse/RN  Name of Charge Nurse/RN Notified [place], RN  Date Charge Nurse/RN Notified 07/11/21  Time Charge Nurse/RN Notified 1250  Notify: Provider  Provider Name/Title Darrick Meigs, MD  Date Provider Notified 07/11/21  Time Provider Notified 1331  Notification Type Page  Notification Reason Change in status;Other (Comment) (yellow MEWS due to fever)  Provider response No new orders (tylenol given)  Date of Provider Response 07/11/21  Time of Provider Response  (n/a)  Document  Patient Outcome Stabilized after interventions  Progress note created (see row info) Yes  Assess: SIRS CRITERIA  SIRS Temperature  1  SIRS Pulse 0  SIRS Respirations  0  SIRS WBC 0  SIRS Score Sum  1  Pt in yellow MEWS due to elevated temperature. Charge RN made aware. MD [place] made aware. Pt given Tylenol. Pt denies any pain, SOB, or dizziness. Yellow MEWS protocol  implemented.

## 2021-07-11 NOTE — Progress Notes (Signed)
Progress Note  Patient Name: [name] Date of Encounter: 07/11/2021  [facility] Cardiologist: Jenkins Rouge, MD   Subjective   Denies any chest pain, dyspnea, lightheadedness  Inpatient Medications    Scheduled Meds:  amoxicillin-clavulanate  1 tablet Oral Q12H   cholecalciferol  2,000 Units Oral q morning   enoxaparin (LOVENOX) injection  1 mg/kg Subcutaneous Q12H   hydrochlorothiazide  12.5 mg Oral Daily   meclizine  25 mg Oral q morning   omeprazole  40 mg Oral QPM   rosuvastatin  10 mg Oral QHS   sertraline  100 mg Oral q morning   Continuous Infusions:  PRN Meds: acetaminophen **OR** acetaminophen, bisacodyl, ipratropium, LORazepam, metoprolol tartrate, ondansetron **OR** ondansetron (ZOFRAN) IV, senna-docusate   Vital Signs    Vitals:   07/10/21 2006 07/11/21 0004 07/11/21 0215 07/11/21 0405  BP: 117/66 117/69  (!) 103/58  Pulse: 72 78  70  Resp: '20 20  20  '$ Temp: 98.4 F (36.9 C) (!) 101.8 F (38.8 C) 100.2 F (37.9 C) 98.4 F (36.9 C)  TempSrc: Oral Oral Oral   SpO2: 97% 100%  96%  Weight:    89.7 kg  Height:        Intake/Output Summary (Last 24 hours) at 07/11/2021 1139 Last data filed at 07/10/2021 2020 Gross per 24 hour  Intake --  Output 300 ml  Net -300 ml   Last 3 Weights 07/11/2021 07/09/2021 07/09/2021  Weight (lbs) 197 lb 12 oz 193 lb 193 lb  Weight (kg) 89.7 kg 87.544 kg 87.544 kg      Telemetry    Not on telemetry- Personally Reviewed  ECG    No new ECG - Personally Reviewed  Physical Exam   GEN: No acute distress.   Neck: No JVD Cardiac: RRR, no murmurs, rubs, or gallops.  Respiratory: Clear to auscultation bilaterally. GI: Soft, nontender, non-distended  MS: No edema; No deformity. Neuro:  Nonfocal  Psych: Normal affect   Labs    High Sensitivity Troponin:  No results for input(s): TROPONINIHS in the last 720 hours.    Chemistry Recent Labs  Lab 07/05/21 1416 07/05/21 1416 07/07/21 0605 07/09/21 0708  07/10/21 0521 07/11/21 0529  NA 136  --  135 134* 135 139  K 4.1  --  3.9 3.7 3.6 3.8  CL 101  --  102 100 102 103  CO2 26  --  '25 25 25 28  '$ GLUCOSE 124*  --  99 114* 105* 103*  BUN 22  --  22 28* 22 21  CREATININE 1.74*   < > 1.53* 1.99* 1.47* 1.49*  CALCIUM 9.3  --  8.7* 8.8* 8.8* 9.1  PROT 6.5  --  6.2*  --   --   --   ALBUMIN 4.0  --  3.7  --   --   --   AST 21  --  22  --   --   --   ALT 16  --  16  --   --   --   ALKPHOS 67  --  55  --   --   --   BILITOT 0.4  --  0.6  --   --   --   GFRNONAA 40*   < > 47* 34* 49* 49*  ANIONGAP 9  --  '8 9 8 8   '$ < > = values in this interval not displayed.     Hematology Recent Labs  Lab 07/09/21 0708 07/10/21  [id number] 07/11/21 0529  WBC 1.9* 1.8* 6.8  RBC 3.51* 3.43* 3.39*  HGB 11.1* 11.0* 10.9*  HCT 33.7* 34.0* 32.7*  MCV 96.0 99.1 96.5  MCH 31.6 32.1 32.2  MCHC 32.9 32.4 33.3  RDW 13.5 13.7 13.5  PLT 76* 77* 69*    BNPNo results for input(s): BNP, PROBNP in the last 168 hours.   DDimer No results for input(s): DDIMER in the last 168 hours.   Radiology    CT CHEST W CONTRAST  Result Date: 07/10/2021 CLINICAL DATA:  Fever of unknown origin. History of chronic lymphocytic leukemia. EXAM: CT CHEST, ABDOMEN, AND PELVIS WITH CONTRAST TECHNIQUE: Multidetector CT imaging of the chest, abdomen and pelvis was performed following the standard protocol during bolus administration of intravenous contrast. CONTRAST:  43m OMNIPAQUE IOHEXOL 350 MG/ML SOLN COMPARISON:  October 18, 2020. FINDINGS: CT CHEST FINDINGS Cardiovascular: Status post aortic valve repair. Atherosclerosis of thoracic aorta is noted without aneurysm or dissection. Normal cardiac size. No pericardial effusion. Mediastinum/Nodes: Thyroid gland is unremarkable. The esophagus is unremarkable. 1.9 cm precarinal lymph node is noted which is significantly enlarged compared to prior exam. 8 mm aortopulmonary window node is noted which is enlarged compared to prior exam. 13 mm right  paratracheal lymph node is noted which is enlarged compared to prior exam. 10 mm right supraclavicular lymph node is noted which is enlarged compared to prior exam. Lungs/Pleura: No pneumothorax or pleural effusion is noted. Stable scarring is noted in right lower lobe. Increased peribronchial opacity is noted in superior segment of right lower lobe concerning for focal inflammation. Musculoskeletal: No chest wall mass or suspicious bone lesions identified. CT ABDOMEN PELVIS FINDINGS Hepatobiliary: No focal liver abnormality is seen. No gallstones, gallbladder wall thickening, or biliary dilatation. Pancreas: Unremarkable. No pancreatic ductal dilatation or surrounding inflammatory changes. Spleen: Normal in size without focal abnormality. Adrenals/Urinary Tract: Adrenal glands appear normal. Bilateral renal cysts are noted. No hydronephrosis or renal obstruction is noted. Urinary bladder is unremarkable. Stomach/Bowel: Stomach is within normal limits. Appendix appears normal. No evidence of bowel wall thickening, distention, or inflammatory changes. Vascular/Lymphatic: Aortic atherosclerosis. 15 x 11 mm lymph node is seen to the left of the superior mesenteric trunk which is slightly smaller compared to prior exam. 7 mm lymph node is noted anterior to the abdominal aorta which is unchanged compared to prior exam 9 mm right periaortic lymph node is noted which is enlarged compared to prior exam. 1 cm left periaortic lymph node is noted just inferior to the splenic vein, which is enlarged compared to prior. Reproductive: Status post prostatectomy. Other: No abdominal wall hernia or abnormality. No abdominopelvic ascites. Musculoskeletal: No acute or significant osseous findings. IMPRESSION: Increased opacity is seen involving the superior segment of right lower lobe concerning for focal pneumonia or other inflammation. Mildly enlarged mediastinal, supraclavicular and periaortic adenopathy is noted concerning for  malignancy given the patient's history of chronic lymphocytic leukemia. Aortic Atherosclerosis (ICD10-I70.0). Electronically Signed   By: JMarijo ConceptionM.D.   On: 07/10/2021 20:34   CT ABDOMEN PELVIS W CONTRAST  Result Date: 07/10/2021 CLINICAL DATA:  Fever of unknown origin. History of chronic lymphocytic leukemia. EXAM: CT CHEST, ABDOMEN, AND PELVIS WITH CONTRAST TECHNIQUE: Multidetector CT imaging of the chest, abdomen and pelvis was performed following the standard protocol during bolus administration of intravenous contrast. CONTRAST:  871mOMNIPAQUE IOHEXOL 350 MG/ML SOLN COMPARISON:  October 18, 2020. FINDINGS: CT CHEST FINDINGS Cardiovascular: Status post aortic valve repair. Atherosclerosis of thoracic aorta is noted  without aneurysm or dissection. Normal cardiac size. No pericardial effusion. Mediastinum/Nodes: Thyroid gland is unremarkable. The esophagus is unremarkable. 1.9 cm precarinal lymph node is noted which is significantly enlarged compared to prior exam. 8 mm aortopulmonary window node is noted which is enlarged compared to prior exam. 13 mm right paratracheal lymph node is noted which is enlarged compared to prior exam. 10 mm right supraclavicular lymph node is noted which is enlarged compared to prior exam. Lungs/Pleura: No pneumothorax or pleural effusion is noted. Stable scarring is noted in right lower lobe. Increased peribronchial opacity is noted in superior segment of right lower lobe concerning for focal inflammation. Musculoskeletal: No chest wall mass or suspicious bone lesions identified. CT ABDOMEN PELVIS FINDINGS Hepatobiliary: No focal liver abnormality is seen. No gallstones, gallbladder wall thickening, or biliary dilatation. Pancreas: Unremarkable. No pancreatic ductal dilatation or surrounding inflammatory changes. Spleen: Normal in size without focal abnormality. Adrenals/Urinary Tract: Adrenal glands appear normal. Bilateral renal cysts are noted. No hydronephrosis or  renal obstruction is noted. Urinary bladder is unremarkable. Stomach/Bowel: Stomach is within normal limits. Appendix appears normal. No evidence of bowel wall thickening, distention, or inflammatory changes. Vascular/Lymphatic: Aortic atherosclerosis. 15 x 11 mm lymph node is seen to the left of the superior mesenteric trunk which is slightly smaller compared to prior exam. 7 mm lymph node is noted anterior to the abdominal aorta which is unchanged compared to prior exam 9 mm right periaortic lymph node is noted which is enlarged compared to prior exam. 1 cm left periaortic lymph node is noted just inferior to the splenic vein, which is enlarged compared to prior. Reproductive: Status post prostatectomy. Other: No abdominal wall hernia or abnormality. No abdominopelvic ascites. Musculoskeletal: No acute or significant osseous findings. IMPRESSION: Increased opacity is seen involving the superior segment of right lower lobe concerning for focal pneumonia or other inflammation. Mildly enlarged mediastinal, supraclavicular and periaortic adenopathy is noted concerning for malignancy given the patient's history of chronic lymphocytic leukemia. Aortic Atherosclerosis (ICD10-I70.0). Electronically Signed   By: Marijo Conception M.D.   On: 07/10/2021 20:34   ECHOCARDIOGRAM COMPLETE  Result Date: 07/10/2021    ECHOCARDIOGRAM REPORT   Patient Name:   GRANTLEY STAN Date of Exam: 07/10/2021 Medical Rec #:  [id number]      Height:       70.0 in Accession #:    [id number]     Weight:       193.0 lb Date of Birth:  11-01-1946      BSA:          2.056 m Patient Age:    12 years       BP:           132/82 mmHg Patient Gender: M              HR:           83 bpm. Exam Location:  Inpatient Procedure: 2D Echo, Cardiac Doppler and Color Doppler Indications:    Bacteremia  History:        Patient has prior history of Echocardiogram examinations, most                 recent 06/21/2020. Arrythmias:Atrial Fibrillation,                  Signs/Symptoms:Bacteremia; Risk Factors:Dyslipidemia. TAVR,                 afib,HLD.  Aortic Valve: 25 mm Carpentier bioprosthetic valve is present in                 the aortic position. Procedure Date: 2011.  Sonographer:    MH Referring Phys: [place]  1. Left ventricular ejection fraction, by estimation, is 55 to 60%. The left ventricle has normal function. The left ventricle has no regional wall motion abnormalities. There is moderate concentric left ventricular hypertrophy. Left ventricular diastolic parameters are indeterminate.  2. Right ventricular systolic function is normal. The right ventricular size is normal. There is normal pulmonary artery systolic pressure.  3. Left atrial size was mildly dilated.  4. The mitral valve is normal in structure. Mild mitral valve regurgitation.  5. No vegetation or perivalvular abscess is seen. The aortic valve has been repaired/replaced. Aortic valve regurgitation is not visualized. There is a 25 mm Carpentier bioprosthetic valve present in the aortic position. Procedure Date: 2011. Echo findings are consistent with stenosis of the aortic prosthesis. Aortic valve mean gradient measures 46.5 mmHg. Aortic valve Vmax measures 4.38 m/s. Aortic valve acceleration time measures 90 msec.  6. There is moderate dilatation of the ascending aorta, measuring 47 mm. Comparison(s): Prior images reviewed side by side. Changes from prior study are noted. The transprosthetic gradients are markedly increased, in the range of severe stenosis. This is a chronic abnormality. Although the gradients are higher since 12/13/2020, the dimensionless index and the calculated effective valve area are unchanged. This suggests that the higher gradients are due to an increase in cardiac output, superimposed on chronic aortic prosthesis mismatch/stenosis,. Conclusion(s)/Recommendation(s): No evidence of valvular vegetations on this transthoracic echocardiogram.  Would recommend a transesophageal echocardiogram to exclude infective endocarditis if clinically indicated. FINDINGS  Left Ventricle: Left ventricular ejection fraction, by estimation, is 55 to 60%. The left ventricle has normal function. The left ventricle has no regional wall motion abnormalities. The left ventricular internal cavity size was normal in size. There is  moderate concentric left ventricular hypertrophy. Left ventricular diastolic parameters are indeterminate. Right Ventricle: The right ventricular size is normal. No increase in right ventricular wall thickness. Right ventricular systolic function is normal. There is normal pulmonary artery systolic pressure. The tricuspid regurgitant velocity is 2.71 m/s, and  with an assumed right atrial pressure of 3 mmHg, the estimated right ventricular systolic pressure is [id number] mmHg. Left Atrium: Left atrial size was mildly dilated. Right Atrium: Right atrial size was normal in size. Pericardium: There is no evidence of pericardial effusion. Mitral Valve: The mitral valve is normal in structure. Mild mitral annular calcification. Mild mitral valve regurgitation. Tricuspid Valve: Diastolic TR is seen due to 1st degree AV block. The tricuspid valve is normal in structure. Tricuspid valve regurgitation is trivial. Aortic Valve: No vegetation or perivalvular abscess is seen. The aortic valve has been repaired/replaced. Aortic valve regurgitation is not visualized. Aortic valve mean gradient measures 46.5 mmHg. Aortic valve peak gradient measures 76.9 mmHg. Aortic valve area, by VTI measures 0.99 cm. There is a 25 mm Carpentier bioprosthetic valve present in the aortic position. Procedure Date: 2011. Pulmonic Valve: The pulmonic valve was grossly normal. Pulmonic valve regurgitation is not visualized. Aorta: The aortic root is normal in size and structure. There is moderate dilatation of the ascending aorta, measuring 47 mm. IAS/Shunts: No atrial level shunt detected  by color flow Doppler.  LEFT VENTRICLE PLAX 2D LVIDd:         5.00 cm     Diastology LVIDs:  3.50 cm     LV e' medial:    7.51 cm/s LV PW:         1.60 cm     LV E/e' medial:  12.0 LV IVS:        1.60 cm     LV e' lateral:   10.80 cm/s LVOT diam:     2.30 cm     LV E/e' lateral: 8.4 LV SV:         88 LV SV Index:   43 LVOT Area:     4.15 cm  LV Volumes (MOD) LV vol d, MOD A4C: 82.7 ml LV vol s, MOD A4C: 26.7 ml LV SV MOD A4C:     82.7 ml RIGHT VENTRICLE RV S prime:     14.60 cm/s TAPSE (M-mode): 2.0 cm LEFT ATRIUM             Index       RIGHT ATRIUM           Index LA diam:        3.80 cm 1.85 cm/m  RA Area:     15.00 cm LA Vol (A2C):   48.4 ml 23.54 ml/m RA Volume:   29.70 ml  14.45 ml/m LA Vol (A4C):   54.4 ml 26.46 ml/m LA Biplane Vol: 51.8 ml 25.20 ml/m  AORTIC VALVE AV Area (Vmax):    1.07 cm AV Area (Vmean):   1.46 cm AV Area (VTI):     0.99 cm AV Vmax:           438.43 cm/s AV Vmean:          216.980 cm/s AV VTI:            0.889 m AV Peak Grad:      76.9 mmHg AV Mean Grad:      46.5 mmHg LVOT Vmax:         113.00 cm/s LVOT Vmean:        76.500 cm/s LVOT VTI:          0.211 m LVOT/AV VTI ratio: 0.24  AORTA Ao Root diam: 3.60 cm Ao Asc diam:  4.70 cm MITRAL VALVE               TRICUSPID VALVE MV Area (PHT): 3.61 cm    TR Peak grad:   29.4 mmHg MV E velocity: 90.41 cm/s  TR Vmax:        271.00 cm/s MV A velocity: 66.80 cm/s MV E/A ratio:  1.35        SHUNTS                            Systemic VTI:  0.21 m                            Systemic Diam: 2.30 cm Sanda Klein MD Electronically signed by Sanda Klein MD Signature Date/Time: 07/10/2021/1:38:57 PM    Final     Cardiac Studies   Echo 8/30:  1. Left ventricular ejection fraction, by estimation, is 55 to 60%. The  left ventricle has normal function. The left ventricle has no regional  wall motion abnormalities. There is moderate concentric left ventricular  hypertrophy. Left ventricular  diastolic parameters are indeterminate.    2. Right ventricular systolic function is normal. The right ventricular  size is normal. There is normal pulmonary artery systolic pressure.  3. Left atrial size was mildly dilated.   4. The mitral valve is normal in structure. Mild mitral valve  regurgitation.   5. No vegetation or perivalvular abscess is seen. The aortic valve has  been repaired/replaced. Aortic valve regurgitation is not visualized.  There is a 25 mm Carpentier bioprosthetic valve present in the aortic  position. Procedure Date: 2011. Echo  findings are consistent with stenosis of the aortic prosthesis. Aortic  valve mean gradient measures 46.5 mmHg. Aortic valve Vmax measures 4.38  m/s. Aortic valve acceleration time measures 90 msec.   6. There is moderate dilatation of the ascending aorta, measuring 47 mm.   Patient Profile     75 y.o. male with a hx of LL, pancytopenia, pulmonary emboli on warfarin, bioprosthetic aortic valve replacement in 2011 who is being seen 07/10/2021 for the evaluation of endocarditis   Assessment & Plan     S/p bioprosthetic AV: He underwent bioprosthetic AVR in July 2011 for severe AI at [facility]  Echo 08/2019 showed increased gradients through aortic valve up to 25 mmHg.  CT showed HALT/HAM, was started on warfarin.  Echo 12/15/2019 showed mean gradients 39 mmHg.  Echo 12/13/2020 showed mean gradient 28 mmHg, DI 0.24, AVA 1.2 cm.  Echocardiogram on 8/30 shows EF 55 to 60%, normal RV function, no vegetation or perivalvular abscess seen, aortic valve mean gradient measures 47 mmHg, V-max 4.4 m/s, acceleration time 90 ms.  Compared to echo 12/13/2020, has higher gradients but unchanged dimensionless index and calculated effective valve area, which along with low acceleration time, suggest increased gradients to high output state in setting of chronic aortic prosthesis mismatch/stenosis.   -Recommend TEE to exclude endocarditis.  This has been scheduled for 9/2 (next available time)   Fevers/Staph  bacteremia: Blood cultures on 8/7 growing staph hominis.  ID consulted.  He is on Augmentin.   AKI on CKD: Baseline creatinine 1.5, was up to 2.0 on admission.  Has improved to 1.5    CLL: Has pancytopenia.  Platelet count 69 today, should be okay for TEE provided platelets above 50   History of recurrent DVT/PE: On warfarin.  Recommend holding warfarin and starting lovenox injections.  We will plan to hold lovenox for TEE on Friday given patient is at increased bleeding risk with thrombocytopenia   PAF: in sinus rhythm.  On warfarin.  Starting Lovenox as above   Hypertension: On hydrochlorothiazide.  Appears controlled  For questions or updates, please contact Jessie Please consult [email or web address] for contact info under        Signed, Donato Heinz, MD  07/11/2021, 11:39 AM

## 2021-07-11 NOTE — Progress Notes (Signed)
Unfortunately, Mr. Gest is still having some temperatures.  He had a temperature spike of 102.6 yesterday.  Infectious disease is seeing him.  Cardiology is seeing him.  He has the Staph in the blood.  He had the echocardiogram which I think looked okay.  However he is going to have a TEE on Friday.  I certainly would agree with empirically treating him for endocarditis.  The Neupogen worked incredibly well.  His white cell count is 6.8.  His ANC is 6000.  His platelet count is dropped a little bit.  His platelet count 69,000.  He did have a CT scan.  The CT scan showed some mild mediastinal/hilar adenopathy.  I think this is probably more reactive.  There may be the possibility of pneumonia on the CT scan.  He is off antibiotics now.  He has had no diarrhea.  His appetite is down a little bit.  He does have sweats when his temperature breaks.  His vital signs show temperature of 98.4.  Pulse 70.  Blood pressure 103/58.  His lungs are clear bilaterally.  I hear no wheezing.  Cardiac exam regular rate and rhythm.  He does have a 2/6 systolic ejection murmur.  Abdomen is soft.  He has good bowel sounds.  There is no palpable liver or spleen tip.  Skin exam is unremarkable.  Neurological exam is nonfocal.  I guess we will have to see how his temperature curve goes.  Again he is not neutropenic any longer.  Just 1 dose Neupogen really did help.  I guess we will have to wait the report from the TEE.  I do appreciate everybody's help.  Lattie Haw, MD  Romans 8:31

## 2021-07-11 NOTE — Progress Notes (Signed)
   07/11/21 0029  Assess: if the MEWS score is Yellow or Red  Were vital signs taken at a resting state? Yes  Focused Assessment No change from prior assessment  Does the patient have a confirmed or suspected source of infection? Yes  MEWS guidelines implemented *See Row Information* No, previously yellow, continue vital signs every 4 hours  Escalate  MEWS: Escalate Yellow: discuss with charge nurse/RN and consider discussing with provider and RRT  Notify: Charge Nurse/RN  Name of Charge Nurse/RN Notified Debbie, RN  Date Charge Nurse/RN Notified 07/11/21  Time Charge Nurse/RN Notified 0030  Notify: Provider  Provider Name/Title Clarene Essex  Date Provider Notified 07/11/21  Time Provider Notified 0040  Notification Type Page  Notification Reason Change in status (fever, 2 blankets on)  Pt's temperature 101.8 placed him in YELLOW MEWS. Informed charge Therapist, sports. Attending notified with no new orders received. Tylenol administered and removed one of the 3 blankets on patient. Yellow MEWS protocol initiated. Will continue to monitor

## 2021-07-11 NOTE — Progress Notes (Signed)
[facility]  PROGRESS NOTE  [name] [id number] DOB: 01-14-1946 DOA: 07/09/2021 PCP: [name], MD   Brief HPI:   75 year old male with history of CLL, pancytopenia, pulmonary embolus due to travel now on chronic warfarin therapy, history of aortic valve replacement in 2011 presented to ED on 8/29 with fever and positive blood culture. Patient's original issue was that he had a nosebleed with and chills with fever 101 at home on 07/07/21; he did present to the emergency department, had cultures drawn, and was started on oral antibiotics. He was told to come back to the emergency department 07/08/21 due to finding of a positive blood culture; he came to the emergency department and improved and was sent home on oral levofloxacin. Around 2 am on 07/09/21 he developed a cold sweat, chills, then burning; temperature was 103 at home. He took Tylenol at home. ED physician discussed the case with the pharmacist who recommended Ancef, which was started in the emergency department.   Subjective   Denies any complaints   Assessment/Plan:     Staph hominis bacteremia -1 set of blood cultures drawn on 8/27 revealed staph hominis -Repeat blood cultures drawn on 8/29 is currently pending -ID has been consulted; IV fluids that this could be contaminant or true positive and culture -TEE scheduled for 07-13-21  Right-sided pneumonia -Started on Augmentin for 5-day course  Acute kidney injury on CKD stage III -Patient baseline creatinine 1.5 -Creatinine back to baseline  CLL/pancytopenia -Currently on therapy with Venetoclax -Followed by [name] -Received Neupogen yesterday  Hypertension -Continue HCTZ  Depression/anxiety -Continue Zoloft, Ativan as needed  Hyperlipidemia -Continue Crestor    Scheduled medications:    amoxicillin-clavulanate  1 tablet Oral Q12H   cholecalciferol  2,000 Units Oral q morning   enoxaparin (LOVENOX) injection  1 mg/kg Subcutaneous  Q12H   hydrochlorothiazide  12.5 mg Oral Daily   meclizine  25 mg Oral q morning   omeprazole  40 mg Oral QPM   rosuvastatin  10 mg Oral QHS   sertraline  100 mg Oral q morning         Data Reviewed:   CBG:  No results for input(s): GLUCAP in the last 168 hours.  SpO2: 97 %    Vitals:   07/11/21 0405 07/11/21 1238 07/11/21 1447 07/11/21 1656  BP: (!) 103/58 110/70 110/68 104/63  Pulse: 70 78 72 70  Resp: '20 20  20  '$ Temp: 98.4 F (36.9 C) (!) 102.8 F (39.3 C) 99.3 F (37.4 C) 98.7 F (37.1 C)  TempSrc:  Oral Oral Oral  SpO2: 96% 91% 95% 97%  Weight: 89.7 kg     Height:         Intake/Output Summary (Last 24 hours) at 07/11/2021 2011 Last data filed at 07/11/2021 1300 Gross per 24 hour  Intake 480 ml  Output 300 ml  Net 180 ml    08/30 0701 - 08/31 1900 In: 480 [P.O.:480] Out: 300 [Urine:300]  Filed Weights   07/09/21 0639 07/11/21 0405  Weight: 87.5 kg 89.7 kg    CBC:  Recent Labs  Lab 07/05/21 1416 07/07/21 0605 07/09/21 0708 07/10/21 0521 07/11/21 0529  WBC 2.7* 2.6* 1.9* 1.8* 6.8  HGB 11.3* 11.5* 11.1* 11.0* 10.9*  HCT 34.0* 34.1* 33.7* 34.0* 32.7*  PLT 142* 102* 76* 77* 69*  MCV 97.1 95.8 96.0 99.1 96.5  MCH 32.3 32.3 31.6 32.1 32.2  MCHC 33.2 33.7 32.9 32.4 33.3  RDW 13.3 13.4 13.5 13.7  13.5  LYMPHSABS 0.7 0.6* 0.3*  --  0.3*  MONOABS 0.7 0.7 0.5  --  0.4  EOSABS 0.0 0.0 0.0  --  0.0  BASOSABS 0.0 0.0 0.0  --  0.0    Complete metabolic panel:  Recent Labs  Lab 07/05/21 1416 07/07/21 0605 07/09/21 0708 07/09/21 0900 07/10/21 0521 07/10/21 1100 07/11/21 0529  NA 136 135 134*  --  135  --  139  K 4.1 3.9 3.7  --  3.6  --  3.8  CL 101 102 100  --  102  --  103  CO2 '26 25 25  '$ --  25  --  28  GLUCOSE 124* 99 114*  --  105*  --  103*  BUN 22 22 28*  --  22  --  21  CREATININE 1.74* 1.53* 1.99*  --  1.47*  --  1.49*  CALCIUM 9.3 8.7* 8.8*  --  8.8*  --  9.1  AST 21 22  --   --   --   --   --   ALT 16 16  --   --   --   --    --   ALKPHOS 67 55  --   --   --   --   --   BILITOT 0.4 0.6  --   --   --   --   --   ALBUMIN 4.0 3.7  --   --   --   --   --   CRP  --   --   --   --   --   --  15.8*  PROCALCITON  --  <0.10 0.15  --  0.15  --  0.15  LATICACIDVEN  --  0.5  --  0.8  --   --   --   INR  --  2.8* 2.8*  --   --  2.3* 1.9*    No results for input(s): LIPASE, AMYLASE in the last 168 hours.  Recent Labs  Lab 07/07/21 0605 07/09/21 0708 07/09/21 0729 07/10/21 0521 07/11/21 0529  CRP  --   --   --   --  15.8*  PROCALCITON <0.10 0.15  --  0.15 0.15  SARSCOV2NAA NEGATIVE  --  NEGATIVE  --   --     ------------------------------------------------------------------------------------------------------------------ No results for input(s): CHOL, HDL, LDLCALC, TRIG, CHOLHDL, LDLDIRECT in the last 72 hours.  Lab Results  Component Value Date   HGBA1C 5.7 11/29/2015   ------------------------------------------------------------------------------------------------------------------ No results for input(s): TSH, T4TOTAL, T3FREE, THYROIDAB in the last 72 hours.  Invalid input(s): FREET3 ------------------------------------------------------------------------------------------------------------------ Recent Labs    07/11/21 0529  FERRITIN 365*    Coagulation profile Recent Labs  Lab 07/07/21 0605 07/09/21 0708 07/10/21 1100 07/11/21 0529  INR 2.8* 2.8* 2.3* 1.9*   No results for input(s): DDIMER in the last 72 hours.  Cardiac Enzymes Recent Labs  Lab 07/11/21 0529  CKTOTAL 73    ------------------------------------------------------------------------------------------------------------------ No results found for: BNP   Antibiotics: Anti-infectives (From admission, onward)    Start     Dose/Rate Route Frequency Ordered Stop   07/11/21 1200  amoxicillin-clavulanate (AUGMENTIN) 875-125 MG per tablet 1 tablet        1 tablet Oral Every 12 hours 07/11/21 1105 07/16/21 0959   07/09/21 1800   vancomycin (VANCOREADY) IVPB 1750 mg/350 mL  Status:  Discontinued        1,750 mg 175 mL/hr over 120 Minutes Intravenous  Once 07/09/21  1704 07/09/21 1728   07/09/21 0715  ceFAZolin (ANCEF) IVPB 2g/100 mL premix  Status:  Discontinued        2 g 200 mL/hr over 30 Minutes Intravenous Every 8 hours 07/09/21 0708 07/10/21 1229        Radiology Reports  CT CHEST W CONTRAST  Result Date: 07/10/2021 CLINICAL DATA:  Fever of unknown origin. History of chronic lymphocytic leukemia. EXAM: CT CHEST, ABDOMEN, AND PELVIS WITH CONTRAST TECHNIQUE: Multidetector CT imaging of the chest, abdomen and pelvis was performed following the standard protocol during bolus administration of intravenous contrast. CONTRAST:  50m OMNIPAQUE IOHEXOL 350 MG/ML SOLN COMPARISON:  October 18, 2020. FINDINGS: CT CHEST FINDINGS Cardiovascular: Status post aortic valve repair. Atherosclerosis of thoracic aorta is noted without aneurysm or dissection. Normal cardiac size. No pericardial effusion. Mediastinum/Nodes: Thyroid gland is unremarkable. The esophagus is unremarkable. 1.9 cm precarinal lymph node is noted which is significantly enlarged compared to prior exam. 8 mm aortopulmonary window node is noted which is enlarged compared to prior exam. 13 mm right paratracheal lymph node is noted which is enlarged compared to prior exam. 10 mm right supraclavicular lymph node is noted which is enlarged compared to prior exam. Lungs/Pleura: No pneumothorax or pleural effusion is noted. Stable scarring is noted in right lower lobe. Increased peribronchial opacity is noted in superior segment of right lower lobe concerning for focal inflammation. Musculoskeletal: No chest wall mass or suspicious bone lesions identified. CT ABDOMEN PELVIS FINDINGS Hepatobiliary: No focal liver abnormality is seen. No gallstones, gallbladder wall thickening, or biliary dilatation. Pancreas: Unremarkable. No pancreatic ductal dilatation or surrounding  inflammatory changes. Spleen: Normal in size without focal abnormality. Adrenals/Urinary Tract: Adrenal glands appear normal. Bilateral renal cysts are noted. No hydronephrosis or renal obstruction is noted. Urinary bladder is unremarkable. Stomach/Bowel: Stomach is within normal limits. Appendix appears normal. No evidence of bowel wall thickening, distention, or inflammatory changes. Vascular/Lymphatic: Aortic atherosclerosis. 15 x 11 mm lymph node is seen to the left of the superior mesenteric trunk which is slightly smaller compared to prior exam. 7 mm lymph node is noted anterior to the abdominal aorta which is unchanged compared to prior exam 9 mm right periaortic lymph node is noted which is enlarged compared to prior exam. 1 cm left periaortic lymph node is noted just inferior to the splenic vein, which is enlarged compared to prior. Reproductive: Status post prostatectomy. Other: No abdominal wall hernia or abnormality. No abdominopelvic ascites. Musculoskeletal: No acute or significant osseous findings. IMPRESSION: Increased opacity is seen involving the superior segment of right lower lobe concerning for focal pneumonia or other inflammation. Mildly enlarged mediastinal, supraclavicular and periaortic adenopathy is noted concerning for malignancy given the patient's history of chronic lymphocytic leukemia. Aortic Atherosclerosis (ICD10-I70.0). Electronically Signed   By: JMarijo ConceptionM.D.   On: 07/10/2021 20:34   CT ABDOMEN PELVIS W CONTRAST  Result Date: 07/10/2021 CLINICAL DATA:  Fever of unknown origin. History of chronic lymphocytic leukemia. EXAM: CT CHEST, ABDOMEN, AND PELVIS WITH CONTRAST TECHNIQUE: Multidetector CT imaging of the chest, abdomen and pelvis was performed following the standard protocol during bolus administration of intravenous contrast. CONTRAST:  839mOMNIPAQUE IOHEXOL 350 MG/ML SOLN COMPARISON:  October 18, 2020. FINDINGS: CT CHEST FINDINGS Cardiovascular: Status post  aortic valve repair. Atherosclerosis of thoracic aorta is noted without aneurysm or dissection. Normal cardiac size. No pericardial effusion. Mediastinum/Nodes: Thyroid gland is unremarkable. The esophagus is unremarkable. 1.9 cm precarinal lymph node is noted which is significantly enlarged compared  to prior exam. 8 mm aortopulmonary window node is noted which is enlarged compared to prior exam. 13 mm right paratracheal lymph node is noted which is enlarged compared to prior exam. 10 mm right supraclavicular lymph node is noted which is enlarged compared to prior exam. Lungs/Pleura: No pneumothorax or pleural effusion is noted. Stable scarring is noted in right lower lobe. Increased peribronchial opacity is noted in superior segment of right lower lobe concerning for focal inflammation. Musculoskeletal: No chest wall mass or suspicious bone lesions identified. CT ABDOMEN PELVIS FINDINGS Hepatobiliary: No focal liver abnormality is seen. No gallstones, gallbladder wall thickening, or biliary dilatation. Pancreas: Unremarkable. No pancreatic ductal dilatation or surrounding inflammatory changes. Spleen: Normal in size without focal abnormality. Adrenals/Urinary Tract: Adrenal glands appear normal. Bilateral renal cysts are noted. No hydronephrosis or renal obstruction is noted. Urinary bladder is unremarkable. Stomach/Bowel: Stomach is within normal limits. Appendix appears normal. No evidence of bowel wall thickening, distention, or inflammatory changes. Vascular/Lymphatic: Aortic atherosclerosis. 15 x 11 mm lymph node is seen to the left of the superior mesenteric trunk which is slightly smaller compared to prior exam. 7 mm lymph node is noted anterior to the abdominal aorta which is unchanged compared to prior exam 9 mm right periaortic lymph node is noted which is enlarged compared to prior exam. 1 cm left periaortic lymph node is noted just inferior to the splenic vein, which is enlarged compared to prior.  Reproductive: Status post prostatectomy. Other: No abdominal wall hernia or abnormality. No abdominopelvic ascites. Musculoskeletal: No acute or significant osseous findings. IMPRESSION: Increased opacity is seen involving the superior segment of right lower lobe concerning for focal pneumonia or other inflammation. Mildly enlarged mediastinal, supraclavicular and periaortic adenopathy is noted concerning for malignancy given the patient's history of chronic lymphocytic leukemia. Aortic Atherosclerosis (ICD10-I70.0). Electronically Signed   By: Marijo Conception M.D.   On: 07/10/2021 20:34   ECHOCARDIOGRAM COMPLETE  Result Date: 07/10/2021    ECHOCARDIOGRAM REPORT   Patient Name:   MANSEL KIRGAN Date of Exam: 07/10/2021 Medical Rec #:  [id number]      Height:       70.0 in Accession #:    [id number]     Weight:       193.0 lb Date of Birth:  [birth year]      BSA:          2.056 m Patient Age:    34 years       BP:           132/82 mmHg Patient Gender: M              HR:           83 bpm. Exam Location:  Inpatient Procedure: 2D Echo, Cardiac Doppler and Color Doppler Indications:    Bacteremia  History:        Patient has prior history of Echocardiogram examinations, most                 recent 06/21/2020. Arrythmias:Atrial Fibrillation,                 Signs/Symptoms:Bacteremia; Risk Factors:Dyslipidemia. TAVR,                 afib,HLD.                 Aortic Valve: 25 mm Carpentier bioprosthetic valve is present in  the aortic position. Procedure Date: 2011.  Sonographer:    MH Referring Phys: Center Line  1. Left ventricular ejection fraction, by estimation, is 55 to 60%. The left ventricle has normal function. The left ventricle has no regional wall motion abnormalities. There is moderate concentric left ventricular hypertrophy. Left ventricular diastolic parameters are indeterminate.  2. Right ventricular systolic function is normal. The right ventricular size is normal. There  is normal pulmonary artery systolic pressure.  3. Left atrial size was mildly dilated.  4. The mitral valve is normal in structure. Mild mitral valve regurgitation.  5. No vegetation or perivalvular abscess is seen. The aortic valve has been repaired/replaced. Aortic valve regurgitation is not visualized. There is a 25 mm Carpentier bioprosthetic valve present in the aortic position. Procedure Date: 2011. Echo findings are consistent with stenosis of the aortic prosthesis. Aortic valve mean gradient measures 46.5 mmHg. Aortic valve Vmax measures 4.38 m/s. Aortic valve acceleration time measures 90 msec.  6. There is moderate dilatation of the ascending aorta, measuring 47 mm. Comparison(s): Prior images reviewed side by side. Changes from prior study are noted. The transprosthetic gradients are markedly increased, in the range of severe stenosis. This is a chronic abnormality. Although the gradients are higher since 12/13/2020, the dimensionless index and the calculated effective valve area are unchanged. This suggests that the higher gradients are due to an increase in cardiac output, superimposed on chronic aortic prosthesis mismatch/stenosis,. Conclusion(s)/Recommendation(s): No evidence of valvular vegetations on this transthoracic echocardiogram. Would recommend a transesophageal echocardiogram to exclude infective endocarditis if clinically indicated. FINDINGS  Left Ventricle: Left ventricular ejection fraction, by estimation, is 55 to 60%. The left ventricle has normal function. The left ventricle has no regional wall motion abnormalities. The left ventricular internal cavity size was normal in size. There is  moderate concentric left ventricular hypertrophy. Left ventricular diastolic parameters are indeterminate. Right Ventricle: The right ventricular size is normal. No increase in right ventricular wall thickness. Right ventricular systolic function is normal. There is normal pulmonary artery systolic  pressure. The tricuspid regurgitant velocity is 2.71 m/s, and  with an assumed right atrial pressure of 3 mmHg, the estimated right ventricular systolic pressure is [id number] mmHg. Left Atrium: Left atrial size was mildly dilated. Right Atrium: Right atrial size was normal in size. Pericardium: There is no evidence of pericardial effusion. Mitral Valve: The mitral valve is normal in structure. Mild mitral annular calcification. Mild mitral valve regurgitation. Tricuspid Valve: Diastolic TR is seen due to 1st degree AV block. The tricuspid valve is normal in structure. Tricuspid valve regurgitation is trivial. Aortic Valve: No vegetation or perivalvular abscess is seen. The aortic valve has been repaired/replaced. Aortic valve regurgitation is not visualized. Aortic valve mean gradient measures 46.5 mmHg. Aortic valve peak gradient measures 76.9 mmHg. Aortic valve area, by VTI measures 0.99 cm. There is a 25 mm Carpentier bioprosthetic valve present in the aortic position. Procedure Date: 2011. Pulmonic Valve: The pulmonic valve was grossly normal. Pulmonic valve regurgitation is not visualized. Aorta: The aortic root is normal in size and structure. There is moderate dilatation of the ascending aorta, measuring 47 mm. IAS/Shunts: No atrial level shunt detected by color flow Doppler.  LEFT VENTRICLE PLAX 2D LVIDd:         5.00 cm     Diastology LVIDs:         3.50 cm     LV e' medial:    7.51 cm/s LV PW:  1.60 cm     LV E/e' medial:  12.0 LV IVS:        1.60 cm     LV e' lateral:   10.80 cm/s LVOT diam:     2.30 cm     LV E/e' lateral: 8.4 LV SV:         88 LV SV Index:   43 LVOT Area:     4.15 cm  LV Volumes (MOD) LV vol d, MOD A4C: 82.7 ml LV vol s, MOD A4C: 26.7 ml LV SV MOD A4C:     82.7 ml RIGHT VENTRICLE RV S prime:     14.60 cm/s TAPSE (M-mode): 2.0 cm LEFT ATRIUM             Index       RIGHT ATRIUM           Index LA diam:        3.80 cm 1.85 cm/m  RA Area:     15.00 cm LA Vol (A2C):   48.4 ml 23.54  ml/m RA Volume:   29.70 ml  14.45 ml/m LA Vol (A4C):   54.4 ml 26.46 ml/m LA Biplane Vol: 51.8 ml 25.20 ml/m  AORTIC VALVE AV Area (Vmax):    1.07 cm AV Area (Vmean):   1.46 cm AV Area (VTI):     0.99 cm AV Vmax:           438.43 cm/s AV Vmean:          216.980 cm/s AV VTI:            0.889 m AV Peak Grad:      76.9 mmHg AV Mean Grad:      46.5 mmHg LVOT Vmax:         113.00 cm/s LVOT Vmean:        76.500 cm/s LVOT VTI:          0.211 m LVOT/AV VTI ratio: 0.24  AORTA Ao Root diam: 3.60 cm Ao Asc diam:  4.70 cm MITRAL VALVE               TRICUSPID VALVE MV Area (PHT): 3.61 cm    TR Peak grad:   29.4 mmHg MV E velocity: 90.41 cm/s  TR Vmax:        271.00 cm/s MV A velocity: 66.80 cm/s MV E/A ratio:  1.35        SHUNTS                            Systemic VTI:  0.21 m                            Systemic Diam: 2.30 cm Mihai Croitoru MD Electronically signed by Sanda Klein MD Signature Date/Time: 07/10/2021/1:38:57 PM    Final       DVT prophylaxis: Coumadin  Code Status: Full code  Family Communication: No family at bedside   Consultants:   Procedures:     Objective    Physical Examination:  She is general-appears in no acute distress Heart-S1-S2, regular, no murmur auscultated Lungs-clear to auscultation bilaterally, no wheezing or crackles auscultated Abdomen-soft, nontender, no organomegaly Extremities-no edema in the lower extremities Neuro-alert, oriented x3, no focal deficit noted  Status is: Inpatient  Dispo: The patient is from: Home              Anticipated d/c  is to: Home              Anticipated d/c date is: 07/15/2021              Patient currently not stable for discharge  Barrier to discharge-ongoing evaluation for bacteremia retrocaval  COVID-19 Labs  Recent Labs    07/11/21 0529  FERRITIN 365*  LDH 310*  CRP 15.8*    Lab Results  Component Value Date   SARSCOV2NAA NEGATIVE 07/09/2021   [place] NEGATIVE 07/07/2021   [place] NEGATIVE  06/18/2021   [place] NEGATIVE 05/29/2021    Microbiology  Recent Results (from the past 240 hour(s))  Blood Culture (routine x 2)     Status: Abnormal   Collection Time: 07/07/21  6:05 AM   Specimen: BLOOD  Result Value Ref Range Status   Specimen Description   Final    BLOOD BLOOD RIGHT FOREARM Performed at [facility], [address] [place], [place] [address]    Special Requests   Final    BOTTLES DRAWN AEROBIC AND ANAEROBIC Blood Culture adequate volume Performed at [facility], [address], [place], [place] [address]    Culture  Setup Time   Final    GRAM POSITIVE COCCI IN CLUSTERS ANAEROBIC BOTTLE ONLY CRITICAL RESULT CALLED TO, READ BACK BY AND VERIFIED WITH: Mickie Hillier RN '@1007'$  07/08/21 EB Performed at [facility] [address], [place], [place] [address]    Culture STAPHYLOCOCCUS HOMINIS (A)  Final   Report Status 07/11/2021 FINAL  Final   Organism ID, Bacteria STAPHYLOCOCCUS HOMINIS  Final      Susceptibility   Staphylococcus hominis - MIC*    CIPROFLOXACIN <=0.5 SENSITIVE Sensitive     ERYTHROMYCIN <=0.25 SENSITIVE Sensitive     GENTAMICIN <=0.5 SENSITIVE Sensitive     OXACILLIN RESISTANT Resistant     TETRACYCLINE <=1 SENSITIVE Sensitive     VANCOMYCIN <=0.5 SENSITIVE Sensitive     TRIMETH/SULFA 20 SENSITIVE Sensitive     CLINDAMYCIN <=0.25 SENSITIVE Sensitive     RIFAMPIN <=0.5 SENSITIVE Sensitive     Inducible Clindamycin NEGATIVE Sensitive     * STAPHYLOCOCCUS HOMINIS  Resp Panel by RT-PCR (Flu A&B, Covid) Peripheral     Status: None   Collection Time: 07/07/21  6:05 AM   Specimen: Peripheral; Nasopharyngeal(NP) swabs in vial transport medium  Result Value Ref Range Status   SARS Coronavirus 2 by RT PCR NEGATIVE NEGATIVE Final    Comment: (NOTE) SARS-CoV-2 target nucleic acids are NOT DETECTED.  The SARS-CoV-2 RNA is generally detectable in upper respiratory specimens during the acute phase  of infection. The lowest concentration of SARS-CoV-2 viral copies this assay can detect is 138 copies/mL. A negative result does not preclude SARS-Cov-2 infection and should not be used as the sole basis for treatment or other patient management decisions. A negative result may occur with  improper specimen collection/handling, submission of specimen other than nasopharyngeal swab, presence of viral mutation(s) within the areas targeted by this assay, and inadequate number of viral copies(<138 copies/mL). A negative result must be combined with clinical observations, patient history, and epidemiological information. The expected result is Negative.  Fact Sheet for Patients:  EntrepreneurPulse.com.au  Fact Sheet for Healthcare Providers:  IncredibleEmployment.be  This test is no t yet approved or cleared by the [place] FDA and  has been authorized for detection and/or diagnosis of SARS-CoV-2 by FDA under an Emergency Use Authorization (EUA). This EUA will remain  in effect (meaning  this test can be used) for the duration of the COVID-19 declaration under Section 564(b)(1) of the Act, 21 U.S.C.section 360bbb-3(b)(1), unless the authorization is terminated  or revoked sooner.       Influenza A by PCR NEGATIVE NEGATIVE Final   Influenza B by PCR NEGATIVE NEGATIVE Final    Comment: (NOTE) The Xpert Xpress SARS-CoV-2/FLU/RSV plus assay is intended as an aid in the diagnosis of influenza from Nasopharyngeal swab specimens and should not be used as a sole basis for treatment. Nasal washings and aspirates are unacceptable for Xpert Xpress SARS-CoV-2/FLU/RSV testing.  Fact Sheet for Patients: EntrepreneurPulse.com.au  Fact Sheet for Healthcare Providers: IncredibleEmployment.be  This test is not yet approved or cleared by the [place] FDA and has been authorized for detection and/or diagnosis of  SARS-CoV-2 by FDA under an Emergency Use Authorization (EUA). This EUA will remain in effect (meaning this test can be used) for the duration of the COVID-19 declaration under Section 564(b)(1) of the Act, 21 U.S.C. section 360bbb-3(b)(1), unless the authorization is terminated or revoked.  Performed at [facility], [address] [place], [place] [address]   Blood Culture ID Panel (Reflexed)     Status: Abnormal   Collection Time: 07/07/21  6:05 AM  Result Value Ref Range Status   Enterococcus faecalis NOT DETECTED NOT DETECTED Final   Enterococcus Faecium NOT DETECTED NOT DETECTED Final   Listeria monocytogenes NOT DETECTED NOT DETECTED Final   Staphylococcus species DETECTED (A) NOT DETECTED Final    Comment: CRITICAL RESULT CALLED TO, READ BACK BY AND VERIFIED WITH: [place] RN '@1007'$  07/08/21 EB    Staphylococcus aureus (BCID) NOT DETECTED NOT DETECTED Final   Staphylococcus epidermidis NOT DETECTED NOT DETECTED Final   Staphylococcus lugdunensis NOT DETECTED NOT DETECTED Final   Streptococcus species NOT DETECTED NOT DETECTED Final   Streptococcus agalactiae NOT DETECTED NOT DETECTED Final   Streptococcus pneumoniae NOT DETECTED NOT DETECTED Final   Streptococcus pyogenes NOT DETECTED NOT DETECTED Final   A.calcoaceticus-baumannii NOT DETECTED NOT DETECTED Final   Bacteroides fragilis NOT DETECTED NOT DETECTED Final   Enterobacterales NOT DETECTED NOT DETECTED Final   Enterobacter cloacae complex NOT DETECTED NOT DETECTED Final   Escherichia coli NOT DETECTED NOT DETECTED Final   Klebsiella aerogenes NOT DETECTED NOT DETECTED Final   Klebsiella oxytoca NOT DETECTED NOT DETECTED Final   Klebsiella pneumoniae NOT DETECTED NOT DETECTED Final   Proteus species NOT DETECTED NOT DETECTED Final   Salmonella species NOT DETECTED NOT DETECTED Final   Serratia marcescens NOT DETECTED NOT DETECTED Final   Haemophilus influenzae NOT DETECTED NOT DETECTED Final    Neisseria meningitidis NOT DETECTED NOT DETECTED Final   Pseudomonas aeruginosa NOT DETECTED NOT DETECTED Final   Stenotrophomonas maltophilia NOT DETECTED NOT DETECTED Final   Candida albicans NOT DETECTED NOT DETECTED Final   Candida auris NOT DETECTED NOT DETECTED Final   Candida glabrata NOT DETECTED NOT DETECTED Final   Candida krusei NOT DETECTED NOT DETECTED Final   Candida parapsilosis NOT DETECTED NOT DETECTED Final   Candida tropicalis NOT DETECTED NOT DETECTED Final   Cryptococcus neoformans/gattii NOT DETECTED NOT DETECTED Final    Comment: Performed at [facility], [address], [place], [place] [address]  MRSA Next Gen by PCR, Nasal     Status: None   Collection Time: 07/08/21  3:27 PM   Specimen: Nasal Mucosa; Nasal Swab  Result Value Ref Range Status   MRSA by PCR Next Gen NOT DETECTED  NOT DETECTED Final    Comment: (NOTE) The GeneXpert MRSA Assay (FDA approved for NASAL specimens only), is one component of a comprehensive MRSA colonization surveillance program. It is not intended to diagnose MRSA infection nor to guide or monitor treatment for MRSA infections. Test performance is not FDA approved in patients less than 21 years old. Performed at [facility], [place] [address], [place], [place] [address]   Urine Culture     Status: None   Collection Time: 07/09/21  6:48 AM   Specimen: Urine, Clean Catch  Result Value Ref Range Status   Specimen Description   Final    URINE, CLEAN CATCH Performed at [facility], [address] [place], [place] [address]    Special Requests   Final    NONE Performed at [facility], [address], [place], [place] [address]    Culture   Final    NO GROWTH Performed at [facility], [place] [address], [place], [place] [address]    Report Status 07/10/2021 FINAL  Final  Blood culture (routine x 2)     Status: None (Preliminary result)   Collection Time:  07/09/21  7:08 AM   Specimen: BLOOD  Result Value Ref Range Status   Specimen Description   Final    BLOOD BLOOD RIGHT FOREARM Performed at [facility], [address], [place], [place] [address]    Special Requests   Final    BOTTLES DRAWN AEROBIC AND ANAEROBIC Blood Culture adequate volume Performed at [facility], [address], [place], [place] [address]    Culture   Final    NO GROWTH 2 DAYS Performed at [facility], [place] [address], [place], [place] [address]    Report Status PENDING  Incomplete  Resp Panel by RT-PCR (Flu A&B, Covid) Nasopharyngeal Swab     Status: None   Collection Time: 07/09/21  7:29 AM   Specimen: Nasopharyngeal Swab; Nasopharyngeal(NP) swabs in vial transport medium  Result Value Ref Range Status   SARS Coronavirus 2 by RT PCR NEGATIVE NEGATIVE Final    Comment: (NOTE) SARS-CoV-2 target nucleic acids are NOT DETECTED.  The SARS-CoV-2 RNA is generally detectable in upper respiratory specimens during the acute phase of infection. The lowest concentration of SARS-CoV-2 viral copies this assay can detect is 138 copies/mL. A negative result does not preclude SARS-Cov-2 infection and should not be used as the sole basis for treatment or other patient management decisions. A negative result may occur with  improper specimen collection/handling, submission of specimen other than nasopharyngeal swab, presence of viral mutation(s) within the areas targeted by this assay, and inadequate number of viral copies(<138 copies/mL). A negative result must be combined with clinical observations, patient history, and epidemiological information. The expected result is Negative.  Fact Sheet for Patients:  EntrepreneurPulse.com.au  Fact Sheet for Healthcare Providers:  IncredibleEmployment.be  This test is no t yet approved or cleared by the [place] FDA and  has been authorized  for detection and/or diagnosis of SARS-CoV-2 by FDA under an Emergency Use Authorization (EUA). This EUA will remain  in effect (meaning this test can be used) for the duration of the COVID-19 declaration under Section 564(b)(1) of the Act, 21 U.S.C.section 360bbb-3(b)(1), unless the authorization is terminated  or revoked sooner.       Influenza A by PCR NEGATIVE NEGATIVE Final   Influenza B by PCR NEGATIVE NEGATIVE Final    Comment: (NOTE) The Xpert  Xpress SARS-CoV-2/FLU/RSV plus assay is intended as an aid in the diagnosis of influenza from Nasopharyngeal swab specimens and should not be used as a sole basis for treatment. Nasal washings and aspirates are unacceptable for Xpert Xpress SARS-CoV-2/FLU/RSV testing.  Fact Sheet for Patients: EntrepreneurPulse.com.au  Fact Sheet for Healthcare Providers: IncredibleEmployment.be  This test is not yet approved or cleared by the [place] FDA and has been authorized for detection and/or diagnosis of SARS-CoV-2 by FDA under an Emergency Use Authorization (EUA). This EUA will remain in effect (meaning this test can be used) for the duration of the COVID-19 declaration under Section 564(b)(1) of the Act, 21 U.S.C. section 360bbb-3(b)(1), unless the authorization is terminated or revoked.  Performed at [facility], [address] [place], [place] [address]   Blood culture (routine x 2)     Status: None (Preliminary result)   Collection Time: 07/09/21  7:30 AM   Specimen: BLOOD LEFT HAND  Result Value Ref Range Status   Specimen Description   Final    BLOOD LEFT HAND Performed at [facility], [address] [place], [place] [address]    Special Requests   Final    Blood Culture adequate volume Performed at [facility], [address] [place], [place] [address]    Culture   Final    NO GROWTH 2 DAYS Performed at [facility], [place] [address] [place], [place] [address]    Report Status PENDING  Incomplete             Oswald Hillock   [facility] If 7PM-7AM, please contact night-coverage at [email or web address], Office  [phone]   07/11/2021, 8:11 PM  LOS: 2 days

## 2021-07-11 NOTE — Progress Notes (Signed)
Subjective:   Patient is quite concerned about recurrent fevers he has been having and also multiple other problems  Antibiotics:  Anti-infectives (From admission, onward)    Start     Dose/Rate Route Frequency Ordered Stop   07/11/21 1200  amoxicillin-clavulanate (AUGMENTIN) 875-125 MG per tablet 1 tablet        1 tablet Oral Every 12 hours 07/11/21 1105 07/16/21 0959   07/09/21 1800  vancomycin (VANCOREADY) IVPB 1750 mg/350 mL  Status:  Discontinued        1,750 mg 175 mL/hr over 120 Minutes Intravenous  Once 07/09/21 1704 07/09/21 1728   07/09/21 0715  ceFAZolin (ANCEF) IVPB 2g/100 mL premix  Status:  Discontinued        2 g 200 mL/hr over 30 Minutes Intravenous Every 8 hours 07/09/21 0708 07/10/21 1229       Medications: Scheduled Meds:  amoxicillin-clavulanate  1 tablet Oral Q12H   cholecalciferol  2,000 Units Oral q morning   enoxaparin (LOVENOX) injection  1 mg/kg Subcutaneous Q12H   hydrochlorothiazide  12.5 mg Oral Daily   meclizine  25 mg Oral q morning   omeprazole  40 mg Oral QPM   rosuvastatin  10 mg Oral QHS   sertraline  100 mg Oral q morning   Continuous Infusions: PRN Meds:.acetaminophen **OR** acetaminophen, bisacodyl, ipratropium, LORazepam, metoprolol tartrate, ondansetron **OR** ondansetron (ZOFRAN) IV, senna-docusate    Objective: Weight change:   Intake/Output Summary (Last 24 hours) at 07/11/2021 1518 Last data filed at 07/11/2021 1300 Gross per 24 hour  Intake 480 ml  Output 300 ml  Net 180 ml   Blood pressure 110/68, pulse 72, temperature 99.3 F (37.4 C), temperature source Oral, resp. rate 20, height '5\' 10"'$  (1.778 m), weight 89.7 kg, SpO2 95 %. Temp:  [98.4 F (36.9 C)-102.8 F (39.3 C)] 99.3 F (37.4 C) (08/31 1447) Pulse Rate:  [70-82] 72 (08/31 1447) Resp:  [20] 20 (08/31 1238) BP: (96-120)/(53-76) 110/68 (08/31 1447) SpO2:  [91 %-100 %] 95 % (08/31 1447) Weight:  [89.7 kg] 89.7 kg (08/31 0405)  Physical  Exam: Physical Exam Constitutional:      Appearance: He is well-developed.  HENT:     Head: Normocephalic and atraumatic.  Eyes:     General:        Right eye: No discharge.        Left eye: No discharge.     Conjunctiva/sclera: Conjunctivae normal.  Cardiovascular:     Rate and Rhythm: Normal rate and regular rhythm.     Heart sounds: Murmur heard.    No friction rub. No gallop.  Pulmonary:     Effort: Pulmonary effort is normal. No respiratory distress.     Breath sounds: No stridor. Examination of the right-lower field reveals decreased breath sounds. Examination of the left-lower field reveals decreased breath sounds. Decreased breath sounds present. No wheezing or rhonchi.  Abdominal:     General: There is no distension.     Palpations: Abdomen is soft. There is no mass.  Musculoskeletal:        General: Normal range of motion.     Cervical back: Normal range of motion and neck supple.  Skin:    General: Skin is warm and dry.     Findings: No erythema or rash.  Neurological:     General: No focal deficit present.     Mental Status: He is alert and oriented to person, place, and time.  Psychiatric:  Mood and Affect: Mood normal.        Behavior: Behavior normal.        Thought Content: Thought content normal.        Judgment: Judgment normal.     CBC:    BMET Recent Labs    07/10/21 0521 07/11/21 0529  NA 135 139  K 3.6 3.8  CL 102 103  CO2 25 28  GLUCOSE 105* 103*  BUN 22 21  CREATININE 1.47* 1.49*  CALCIUM 8.8* 9.1     Liver Panel  No results for input(s): PROT, ALBUMIN, AST, ALT, ALKPHOS, BILITOT, BILIDIR, IBILI in the last 72 hours.     Sedimentation Rate Recent Labs    07/11/21 0529  ESRSEDRATE 36*   C-Reactive Protein Recent Labs    07/11/21 0529  CRP 15.8*    Micro Results: Recent Results (from the past 720 hour(s))  Resp Panel by RT-PCR (Flu A&B, Covid) Nasopharyngeal Swab     Status: None   Collection Time: 06/18/21  12:37 AM   Specimen: Nasopharyngeal Swab; Nasopharyngeal(NP) swabs in vial transport medium  Result Value Ref Range Status   SARS Coronavirus 2 by RT PCR NEGATIVE NEGATIVE Final    Comment: (NOTE) SARS-CoV-2 target nucleic acids are NOT DETECTED.  The SARS-CoV-2 RNA is generally detectable in upper respiratory specimens during the acute phase of infection. The lowest concentration of SARS-CoV-2 viral copies this assay can detect is 138 copies/mL. A negative result does not preclude SARS-Cov-2 infection and should not be used as the sole basis for treatment or other patient management decisions. A negative result may occur with  improper specimen collection/handling, submission of specimen other than nasopharyngeal swab, presence of viral mutation(s) within the areas targeted by this assay, and inadequate number of viral copies(<138 copies/mL). A negative result must be combined with clinical observations, patient history, and epidemiological information. The expected result is Negative.  Fact Sheet for Patients:  EntrepreneurPulse.com.au  Fact Sheet for Healthcare Providers:  IncredibleEmployment.be  This test is no t yet approved or cleared by the [place] FDA and  has been authorized for detection and/or diagnosis of SARS-CoV-2 by FDA under an Emergency Use Authorization (EUA). This EUA will remain  in effect (meaning this test can be used) for the duration of the COVID-19 declaration under Section 564(b)(1) of the Act, 21 U.S.C.section 360bbb-3(b)(1), unless the authorization is terminated  or revoked sooner.       Influenza A by PCR NEGATIVE NEGATIVE Final   Influenza B by PCR NEGATIVE NEGATIVE Final    Comment: (NOTE) The Xpert Xpress SARS-CoV-2/FLU/RSV plus assay is intended as an aid in the diagnosis of influenza from Nasopharyngeal swab specimens and should not be used as a sole basis for treatment. Nasal washings and aspirates  are unacceptable for Xpert Xpress SARS-CoV-2/FLU/RSV testing.  Fact Sheet for Patients: EntrepreneurPulse.com.au  Fact Sheet for Healthcare Providers: IncredibleEmployment.be  This test is not yet approved or cleared by the [place] FDA and has been authorized for detection and/or diagnosis of SARS-CoV-2 by FDA under an Emergency Use Authorization (EUA). This EUA will remain in effect (meaning this test can be used) for the duration of the COVID-19 declaration under Section 564(b)(1) of the Act, 21 U.S.C. section 360bbb-3(b)(1), unless the authorization is terminated or revoked.  Performed at [facility], [place] [address], [place], [place] [address]   Blood Culture (routine x 2)     Status: None   Collection Time: 06/18/21 10:50 PM  Specimen: BLOOD RIGHT ARM  Result Value Ref Range Status   Specimen Description BLOOD RIGHT ARM  Final   Special Requests   Final    BOTTLES DRAWN AEROBIC AND ANAEROBIC Blood Culture results may not be optimal due to an excessive volume of blood received in culture bottles   Culture   Final    NO GROWTH 5 DAYS Performed at [facility], [place] [address], [place], [place] [address]    Report Status 06/23/2021 FINAL  Final  Blood Culture (routine x 2)     Status: None   Collection Time: 06/18/21 11:00 PM   Specimen: BLOOD LEFT ARM  Result Value Ref Range Status   Specimen Description BLOOD LEFT ARM  Final   Special Requests   Final    BOTTLES DRAWN AEROBIC ONLY Blood Culture results may not be optimal due to an excessive volume of blood received in culture bottles   Culture   Final    NO GROWTH 5 DAYS Performed at [facility], [place] [address], [place], [place] [address]    Report Status 06/23/2021 FINAL  Final  Blood Culture (routine x 2)     Status: Abnormal   Collection Time: 07/07/21  6:05 AM   Specimen: BLOOD  Result Value Ref Range Status   Specimen Description   Final    BLOOD  BLOOD RIGHT FOREARM Performed at [facility], [address] [place], [place] [address]    Special Requests   Final    BOTTLES DRAWN AEROBIC AND ANAEROBIC Blood Culture adequate volume Performed at [facility], [address], [place], [place] [address]    Culture  Setup Time   Final    GRAM POSITIVE COCCI IN CLUSTERS ANAEROBIC BOTTLE ONLY CRITICAL RESULT CALLED TO, READ BACK BY AND VERIFIED WITH: Mickie Hillier RN '@1007'$  07/08/21 EB Performed at [facility], [place] [address], [place], [place] [address]    Culture STAPHYLOCOCCUS HOMINIS (A)  Final   Report Status 07/11/2021 FINAL  Final   Organism ID, Bacteria STAPHYLOCOCCUS HOMINIS  Final      Susceptibility   Staphylococcus hominis - MIC*    CIPROFLOXACIN <=0.5 SENSITIVE Sensitive     ERYTHROMYCIN <=0.25 SENSITIVE Sensitive     GENTAMICIN <=0.5 SENSITIVE Sensitive     OXACILLIN RESISTANT Resistant     TETRACYCLINE <=1 SENSITIVE Sensitive     VANCOMYCIN <=0.5 SENSITIVE Sensitive     TRIMETH/SULFA 20 SENSITIVE Sensitive     CLINDAMYCIN <=0.25 SENSITIVE Sensitive     RIFAMPIN <=0.5 SENSITIVE Sensitive     Inducible Clindamycin NEGATIVE Sensitive     * STAPHYLOCOCCUS HOMINIS  Resp Panel by RT-PCR (Flu A&B, Covid) Peripheral     Status: None   Collection Time: 07/07/21  6:05 AM   Specimen: Peripheral; Nasopharyngeal(NP) swabs in vial transport medium  Result Value Ref Range Status   SARS Coronavirus 2 by RT PCR NEGATIVE NEGATIVE Final    Comment: (NOTE) SARS-CoV-2 target nucleic acids are NOT DETECTED.  The SARS-CoV-2 RNA is generally detectable in upper respiratory specimens during the acute phase of infection. The lowest concentration of SARS-CoV-2 viral copies this assay can detect is 138 copies/mL. A negative result does not preclude SARS-Cov-2 infection and should not be used as the sole basis for treatment or other patient management decisions. A negative result may occur with   improper specimen collection/handling, submission of specimen other than nasopharyngeal swab, presence of viral mutation(s) within the areas targeted by  this assay, and inadequate number of viral copies(<138 copies/mL). A negative result must be combined with clinical observations, patient history, and epidemiological information. The expected result is Negative.  Fact Sheet for Patients:  EntrepreneurPulse.com.au  Fact Sheet for Healthcare Providers:  IncredibleEmployment.be  This test is no t yet approved or cleared by the [place] FDA and  has been authorized for detection and/or diagnosis of SARS-CoV-2 by FDA under an Emergency Use Authorization (EUA). This EUA will remain  in effect (meaning this test can be used) for the duration of the COVID-19 declaration under Section 564(b)(1) of the Act, 21 U.S.C.section 360bbb-3(b)(1), unless the authorization is terminated  or revoked sooner.       Influenza A by PCR NEGATIVE NEGATIVE Final   Influenza B by PCR NEGATIVE NEGATIVE Final    Comment: (NOTE) The Xpert Xpress SARS-CoV-2/FLU/RSV plus assay is intended as an aid in the diagnosis of influenza from Nasopharyngeal swab specimens and should not be used as a sole basis for treatment. Nasal washings and aspirates are unacceptable for Xpert Xpress SARS-CoV-2/FLU/RSV testing.  Fact Sheet for Patients: EntrepreneurPulse.com.au  Fact Sheet for Healthcare Providers: IncredibleEmployment.be  This test is not yet approved or cleared by the [place] FDA and has been authorized for detection and/or diagnosis of SARS-CoV-2 by FDA under an Emergency Use Authorization (EUA). This EUA will remain in effect (meaning this test can be used) for the duration of the COVID-19 declaration under Section 564(b)(1) of the Act, 21 U.S.C. section 360bbb-3(b)(1), unless the authorization is terminated  or revoked.  Performed at [facility], [address], [place], [place] [address]   Blood Culture ID Panel (Reflexed)     Status: Abnormal   Collection Time: 07/07/21  6:05 AM  Result Value Ref Range Status   Enterococcus faecalis NOT DETECTED NOT DETECTED Final   Enterococcus Faecium NOT DETECTED NOT DETECTED Final   Listeria monocytogenes NOT DETECTED NOT DETECTED Final   Staphylococcus species DETECTED (A) NOT DETECTED Final    Comment: CRITICAL RESULT CALLED TO, READ BACK BY AND VERIFIED WITH: [place] RN '@1007'$  07/08/21 EB    Staphylococcus aureus (BCID) NOT DETECTED NOT DETECTED Final   Staphylococcus epidermidis NOT DETECTED NOT DETECTED Final   Staphylococcus lugdunensis NOT DETECTED NOT DETECTED Final   Streptococcus species NOT DETECTED NOT DETECTED Final   Streptococcus agalactiae NOT DETECTED NOT DETECTED Final   Streptococcus pneumoniae NOT DETECTED NOT DETECTED Final   Streptococcus pyogenes NOT DETECTED NOT DETECTED Final   A.calcoaceticus-baumannii NOT DETECTED NOT DETECTED Final   Bacteroides fragilis NOT DETECTED NOT DETECTED Final   Enterobacterales NOT DETECTED NOT DETECTED Final   Enterobacter cloacae complex NOT DETECTED NOT DETECTED Final   Escherichia coli NOT DETECTED NOT DETECTED Final   Klebsiella aerogenes NOT DETECTED NOT DETECTED Final   Klebsiella oxytoca NOT DETECTED NOT DETECTED Final   Klebsiella pneumoniae NOT DETECTED NOT DETECTED Final   Proteus species NOT DETECTED NOT DETECTED Final   Salmonella species NOT DETECTED NOT DETECTED Final   Serratia marcescens NOT DETECTED NOT DETECTED Final   Haemophilus influenzae NOT DETECTED NOT DETECTED Final   Neisseria meningitidis NOT DETECTED NOT DETECTED Final   Pseudomonas aeruginosa NOT DETECTED NOT DETECTED Final   Stenotrophomonas maltophilia NOT DETECTED NOT DETECTED Final   Candida albicans NOT DETECTED NOT DETECTED Final   Candida auris NOT DETECTED NOT DETECTED Final    Candida glabrata NOT DETECTED NOT DETECTED Final   Candida krusei NOT DETECTED NOT DETECTED Final  Candida parapsilosis NOT DETECTED NOT DETECTED Final   Candida tropicalis NOT DETECTED NOT DETECTED Final   Cryptococcus neoformans/gattii NOT DETECTED NOT DETECTED Final    Comment: Performed at [facility], [address], [place], [place] [address]  MRSA Next Gen by PCR, Nasal     Status: None   Collection Time: 07/08/21  3:27 PM   Specimen: Nasal Mucosa; Nasal Swab  Result Value Ref Range Status   MRSA by PCR Next Gen NOT DETECTED NOT DETECTED Final    Comment: (NOTE) The GeneXpert MRSA Assay (FDA approved for NASAL specimens only), is one component of a comprehensive MRSA colonization surveillance program. It is not intended to diagnose MRSA infection nor to guide or monitor treatment for MRSA infections. Test performance is not FDA approved in patients less than 34 years old. Performed at [facility], [place] [address], [place], [place] [address]   Urine Culture     Status: None   Collection Time: 07/09/21  6:48 AM   Specimen: Urine, Clean Catch  Result Value Ref Range Status   Specimen Description   Final    URINE, CLEAN CATCH Performed at [facility], [address], [place], [place] [address]    Special Requests   Final    NONE Performed at [facility], [address], [place], [place] [address]    Culture   Final    NO GROWTH Performed at [facility], [place] [address] [place], [place] [address]    Report Status 07/10/2021 FINAL  Final  Blood culture (routine x 2)     Status: None (Preliminary result)   Collection Time: 07/09/21  7:08 AM   Specimen: BLOOD  Result Value Ref Range Status   Specimen Description   Final    BLOOD BLOOD RIGHT FOREARM Performed at [facility], [address] [place], [place] [address]    Special Requests   Final    BOTTLES DRAWN AEROBIC AND  ANAEROBIC Blood Culture adequate volume Performed at [facility], [address] [place], [place] [address]    Culture   Final    NO GROWTH 2 DAYS Performed at [facility], [place] [address], [place], [place] [address]    Report Status PENDING  Incomplete  Resp Panel by RT-PCR (Flu A&B, Covid) Nasopharyngeal Swab     Status: None   Collection Time: 07/09/21  7:29 AM   Specimen: Nasopharyngeal Swab; Nasopharyngeal(NP) swabs in vial transport medium  Result Value Ref Range Status   SARS Coronavirus 2 by RT PCR NEGATIVE NEGATIVE Final    Comment: (NOTE) SARS-CoV-2 target nucleic acids are NOT DETECTED.  The SARS-CoV-2 RNA is generally detectable in upper respiratory specimens during the acute phase of infection. The lowest concentration of SARS-CoV-2 viral copies this assay can detect is 138 copies/mL. A negative result does not preclude SARS-Cov-2 infection and should not be used as the sole basis for treatment or other patient management decisions. A negative result may occur with  improper specimen collection/handling, submission of specimen other than nasopharyngeal swab, presence of viral mutation(s) within the areas targeted by this assay, and inadequate number of viral copies(<138 copies/mL). A negative result must be combined with clinical observations, patient history, and epidemiological information. The expected result is Negative.  Fact Sheet for Patients:  EntrepreneurPulse.com.au  Fact Sheet for Healthcare Providers:  IncredibleEmployment.be  This test is no t yet approved or cleared by the [place] and  has been authorized for detection and/or diagnosis of SARS-CoV-2 by FDA under an Emergency Use Authorization (EUA). This EUA will remain  in effect (meaning this test can be used) for the duration of the COVID-19 declaration under Section 564(b)(1) of the Act, 21 U.S.C.section 360bbb-3(b)(1),  unless the authorization is terminated  or revoked sooner.       Influenza A by PCR NEGATIVE NEGATIVE Final   Influenza B by PCR NEGATIVE NEGATIVE Final    Comment: (NOTE) The Xpert Xpress SARS-CoV-2/FLU/RSV plus assay is intended as an aid in the diagnosis of influenza from Nasopharyngeal swab specimens and should not be used as a sole basis for treatment. Nasal washings and aspirates are unacceptable for Xpert Xpress SARS-CoV-2/FLU/RSV testing.  Fact Sheet for Patients: EntrepreneurPulse.com.au  Fact Sheet for Healthcare Providers: IncredibleEmployment.be  This test is not yet approved or cleared by the [place] FDA and has been authorized for detection and/or diagnosis of SARS-CoV-2 by FDA under an Emergency Use Authorization (EUA). This EUA will remain in effect (meaning this test can be used) for the duration of the COVID-19 declaration under Section 564(b)(1) of the Act, 21 U.S.C. section 360bbb-3(b)(1), unless the authorization is terminated or revoked.  Performed at [facility], [address] [place], [place] [address]   Blood culture (routine x 2)     Status: None (Preliminary result)   Collection Time: 07/09/21  7:30 AM   Specimen: BLOOD LEFT HAND  Result Value Ref Range Status   Specimen Description   Final    BLOOD LEFT HAND Performed at [facility], [address], [place], [place] [address]    Special Requests   Final    Blood Culture adequate volume Performed at [facility], [address], [place], [place] [address]    Culture   Final    NO GROWTH 2 DAYS Performed at [facility], [place] [address] [place], [place] [address]    Report Status PENDING  Incomplete    Studies/Results: CT CHEST W CONTRAST  Result Date: 07/10/2021 CLINICAL DATA:  Fever of unknown origin. History of chronic lymphocytic leukemia. EXAM: CT CHEST, ABDOMEN, AND PELVIS  WITH CONTRAST TECHNIQUE: Multidetector CT imaging of the chest, abdomen and pelvis was performed following the standard protocol during bolus administration of intravenous contrast. CONTRAST:  49m OMNIPAQUE IOHEXOL 350 MG/ML SOLN COMPARISON:  October 18, 2020. FINDINGS: CT CHEST FINDINGS Cardiovascular: Status post aortic valve repair. Atherosclerosis of thoracic aorta is noted without aneurysm or dissection. Normal cardiac size. No pericardial effusion. Mediastinum/Nodes: Thyroid gland is unremarkable. The esophagus is unremarkable. 1.9 cm precarinal lymph node is noted which is significantly enlarged compared to prior exam. 8 mm aortopulmonary window node is noted which is enlarged compared to prior exam. 13 mm right paratracheal lymph node is noted which is enlarged compared to prior exam. 10 mm right supraclavicular lymph node is noted which is enlarged compared to prior exam. Lungs/Pleura: No pneumothorax or pleural effusion is noted. Stable scarring is noted in right lower lobe. Increased peribronchial opacity is noted in superior segment of right lower lobe concerning for focal inflammation. Musculoskeletal: No chest wall mass or suspicious bone lesions identified. CT ABDOMEN PELVIS FINDINGS Hepatobiliary: No focal liver abnormality is seen. No gallstones, gallbladder wall thickening, or biliary dilatation. Pancreas: Unremarkable. No pancreatic ductal dilatation or surrounding inflammatory changes. Spleen: Normal in size without focal abnormality. Adrenals/Urinary Tract: Adrenal glands appear normal. Bilateral renal cysts are noted. No hydronephrosis or renal obstruction is noted. Urinary  bladder is unremarkable. Stomach/Bowel: Stomach is within normal limits. Appendix appears normal. No evidence of bowel wall thickening, distention, or inflammatory changes. Vascular/Lymphatic: Aortic atherosclerosis. 15 x 11 mm lymph node is seen to the left of the superior mesenteric trunk which is slightly smaller  compared to prior exam. 7 mm lymph node is noted anterior to the abdominal aorta which is unchanged compared to prior exam 9 mm right periaortic lymph node is noted which is enlarged compared to prior exam. 1 cm left periaortic lymph node is noted just inferior to the splenic vein, which is enlarged compared to prior. Reproductive: Status post prostatectomy. Other: No abdominal wall hernia or abnormality. No abdominopelvic ascites. Musculoskeletal: No acute or significant osseous findings. IMPRESSION: Increased opacity is seen involving the superior segment of right lower lobe concerning for focal pneumonia or other inflammation. Mildly enlarged mediastinal, supraclavicular and periaortic adenopathy is noted concerning for malignancy given the patient's history of chronic lymphocytic leukemia. Aortic Atherosclerosis (ICD10-I70.0). Electronically Signed   By: Marijo Conception M.D.   On: 07/10/2021 20:34   CT ABDOMEN PELVIS W CONTRAST  Result Date: 07/10/2021 CLINICAL DATA:  Fever of unknown origin. History of chronic lymphocytic leukemia. EXAM: CT CHEST, ABDOMEN, AND PELVIS WITH CONTRAST TECHNIQUE: Multidetector CT imaging of the chest, abdomen and pelvis was performed following the standard protocol during bolus administration of intravenous contrast. CONTRAST:  72m OMNIPAQUE IOHEXOL 350 MG/ML SOLN COMPARISON:  October 18, 2020. FINDINGS: CT CHEST FINDINGS Cardiovascular: Status post aortic valve repair. Atherosclerosis of thoracic aorta is noted without aneurysm or dissection. Normal cardiac size. No pericardial effusion. Mediastinum/Nodes: Thyroid gland is unremarkable. The esophagus is unremarkable. 1.9 cm precarinal lymph node is noted which is significantly enlarged compared to prior exam. 8 mm aortopulmonary window node is noted which is enlarged compared to prior exam. 13 mm right paratracheal lymph node is noted which is enlarged compared to prior exam. 10 mm right supraclavicular lymph node is noted  which is enlarged compared to prior exam. Lungs/Pleura: No pneumothorax or pleural effusion is noted. Stable scarring is noted in right lower lobe. Increased peribronchial opacity is noted in superior segment of right lower lobe concerning for focal inflammation. Musculoskeletal: No chest wall mass or suspicious bone lesions identified. CT ABDOMEN PELVIS FINDINGS Hepatobiliary: No focal liver abnormality is seen. No gallstones, gallbladder wall thickening, or biliary dilatation. Pancreas: Unremarkable. No pancreatic ductal dilatation or surrounding inflammatory changes. Spleen: Normal in size without focal abnormality. Adrenals/Urinary Tract: Adrenal glands appear normal. Bilateral renal cysts are noted. No hydronephrosis or renal obstruction is noted. Urinary bladder is unremarkable. Stomach/Bowel: Stomach is within normal limits. Appendix appears normal. No evidence of bowel wall thickening, distention, or inflammatory changes. Vascular/Lymphatic: Aortic atherosclerosis. 15 x 11 mm lymph node is seen to the left of the superior mesenteric trunk which is slightly smaller compared to prior exam. 7 mm lymph node is noted anterior to the abdominal aorta which is unchanged compared to prior exam 9 mm right periaortic lymph node is noted which is enlarged compared to prior exam. 1 cm left periaortic lymph node is noted just inferior to the splenic vein, which is enlarged compared to prior. Reproductive: Status post prostatectomy. Other: No abdominal wall hernia or abnormality. No abdominopelvic ascites. Musculoskeletal: No acute or significant osseous findings. IMPRESSION: Increased opacity is seen involving the superior segment of right lower lobe concerning for focal pneumonia or other inflammation. Mildly enlarged mediastinal, supraclavicular and periaortic adenopathy is noted concerning for malignancy given the patient's history of  chronic lymphocytic leukemia. Aortic Atherosclerosis (ICD10-I70.0). Electronically  Signed   By: Marijo Conception M.D.   On: 07/10/2021 20:34   ECHOCARDIOGRAM COMPLETE  Result Date: 07/10/2021    ECHOCARDIOGRAM REPORT   Patient Name:   ANANIA BARBIE Date of Exam: 07/10/2021 Medical Rec #:  [id number]      Height:       70.0 in Accession #:    [id number]     Weight:       193.0 lb Date of Birth:  [birth year]      BSA:          2.056 m Patient Age:    55 years       BP:           132/82 mmHg Patient Gender: M              HR:           83 bpm. Exam Location:  Inpatient Procedure: 2D Echo, Cardiac Doppler and Color Doppler Indications:    Bacteremia  History:        Patient has prior history of Echocardiogram examinations, most                 recent 06/21/2020. Arrythmias:Atrial Fibrillation,                 Signs/Symptoms:Bacteremia; Risk Factors:Dyslipidemia. TAVR,                 afib,HLD.                 Aortic Valve: 25 mm Carpentier bioprosthetic valve is present in                 the aortic position. Procedure Date: 2011.  Sonographer:    MH Referring Phys: [place]  1. Left ventricular ejection fraction, by estimation, is 55 to 60%. The left ventricle has normal function. The left ventricle has no regional wall motion abnormalities. There is moderate concentric left ventricular hypertrophy. Left ventricular diastolic parameters are indeterminate.  2. Right ventricular systolic function is normal. The right ventricular size is normal. There is normal pulmonary artery systolic pressure.  3. Left atrial size was mildly dilated.  4. The mitral valve is normal in structure. Mild mitral valve regurgitation.  5. No vegetation or perivalvular abscess is seen. The aortic valve has been repaired/replaced. Aortic valve regurgitation is not visualized. There is a 25 mm Carpentier bioprosthetic valve present in the aortic position. Procedure Date: 2011. Echo findings are consistent with stenosis of the aortic prosthesis. Aortic valve mean gradient measures 46.5 mmHg. Aortic valve  Vmax measures 4.38 m/s. Aortic valve acceleration time measures 90 msec.  6. There is moderate dilatation of the ascending aorta, measuring 47 mm. Comparison(s): Prior images reviewed side by side. Changes from prior study are noted. The transprosthetic gradients are markedly increased, in the range of severe stenosis. This is a chronic abnormality. Although the gradients are higher since 12/13/2020, the dimensionless index and the calculated effective valve area are unchanged. This suggests that the higher gradients are due to an increase in cardiac output, superimposed on chronic aortic prosthesis mismatch/stenosis,. Conclusion(s)/Recommendation(s): No evidence of valvular vegetations on this transthoracic echocardiogram. Would recommend a transesophageal echocardiogram to exclude infective endocarditis if clinically indicated. FINDINGS  Left Ventricle: Left ventricular ejection fraction, by estimation, is 55 to 60%. The left ventricle has normal function. The left ventricle has no regional wall motion abnormalities. The left ventricular  internal cavity size was normal in size. There is  moderate concentric left ventricular hypertrophy. Left ventricular diastolic parameters are indeterminate. Right Ventricle: The right ventricular size is normal. No increase in right ventricular wall thickness. Right ventricular systolic function is normal. There is normal pulmonary artery systolic pressure. The tricuspid regurgitant velocity is 2.71 m/s, and  with an assumed right atrial pressure of 3 mmHg, the estimated right ventricular systolic pressure is [id number] mmHg. Left Atrium: Left atrial size was mildly dilated. Right Atrium: Right atrial size was normal in size. Pericardium: There is no evidence of pericardial effusion. Mitral Valve: The mitral valve is normal in structure. Mild mitral annular calcification. Mild mitral valve regurgitation. Tricuspid Valve: Diastolic TR is seen due to 1st degree AV block. The tricuspid  valve is normal in structure. Tricuspid valve regurgitation is trivial. Aortic Valve: No vegetation or perivalvular abscess is seen. The aortic valve has been repaired/replaced. Aortic valve regurgitation is not visualized. Aortic valve mean gradient measures 46.5 mmHg. Aortic valve peak gradient measures 76.9 mmHg. Aortic valve area, by VTI measures 0.99 cm. There is a 25 mm Carpentier bioprosthetic valve present in the aortic position. Procedure Date: 2011. Pulmonic Valve: The pulmonic valve was grossly normal. Pulmonic valve regurgitation is not visualized. Aorta: The aortic root is normal in size and structure. There is moderate dilatation of the ascending aorta, measuring 47 mm. IAS/Shunts: No atrial level shunt detected by color flow Doppler.  LEFT VENTRICLE PLAX 2D LVIDd:         5.00 cm     Diastology LVIDs:         3.50 cm     LV e' medial:    7.51 cm/s LV PW:         1.60 cm     LV E/e' medial:  12.0 LV IVS:        1.60 cm     LV e' lateral:   10.80 cm/s LVOT diam:     2.30 cm     LV E/e' lateral: 8.4 LV SV:         88 LV SV Index:   43 LVOT Area:     4.15 cm  LV Volumes (MOD) LV vol d, MOD A4C: 82.7 ml LV vol s, MOD A4C: 26.7 ml LV SV MOD A4C:     82.7 ml RIGHT VENTRICLE RV S prime:     14.60 cm/s TAPSE (M-mode): 2.0 cm LEFT ATRIUM             Index       RIGHT ATRIUM           Index LA diam:        3.80 cm 1.85 cm/m  RA Area:     15.00 cm LA Vol (A2C):   48.4 ml 23.54 ml/m RA Volume:   29.70 ml  14.45 ml/m LA Vol (A4C):   54.4 ml 26.46 ml/m LA Biplane Vol: 51.8 ml 25.20 ml/m  AORTIC VALVE AV Area (Vmax):    1.07 cm AV Area (Vmean):   1.46 cm AV Area (VTI):     0.99 cm AV Vmax:           438.43 cm/s AV Vmean:          216.980 cm/s AV VTI:            0.889 m AV Peak Grad:      76.9 mmHg AV Mean Grad:      46.5 mmHg LVOT Vmax:  113.00 cm/s LVOT Vmean:        76.500 cm/s LVOT VTI:          0.211 m LVOT/AV VTI ratio: 0.24  AORTA Ao Root diam: 3.60 cm Ao Asc diam:  4.70 cm MITRAL VALVE                TRICUSPID VALVE MV Area (PHT): 3.61 cm    TR Peak grad:   29.4 mmHg MV E velocity: 90.41 cm/s  TR Vmax:        271.00 cm/s MV A velocity: 66.80 cm/s MV E/A ratio:  1.35        SHUNTS                            Systemic VTI:  0.21 m                            Systemic Diam: 2.30 cm Mihai Croitoru MD Electronically signed by Sanda Klein MD Signature Date/Time: 07/10/2021/1:38:57 PM    Final       Assessment/Plan:  INTERVAL HISTORY: CT chest abdomen pelvis performed which shows possible pneumonia right lower lobe with expected mediastinal supraclavicular aortic adenopathy.   Principal Problem:   Bacteremia Active Problems:   Essential hypertension   PROSTATE CANCER, HX OF   Chronic lymphocytic leukemia (HCC)   S/P Bioprosthetic AVR (aortic valve replacement) in 2011   CKD (chronic kidney disease), stage III (HCC)   History of pulmonary embolism   Neutropenic fever (HCC)   FUO (fever of unknown origin)   Pancytopenia ([place])    [name] is a 75 y.o. male with AVR, CLL on chemotherapy with recurrent episodes of fever drenching sweats myalgias diffuse body aches in the context of neutropenia found to have Staph hominis and an isolated blood culture on August 27th.  He had been seen in the ER and prescribed cefdinir but was called back and came back to the hospital 28th.  He wished not to be admitted and was changed over to levofloxacin.  Unfortunately blood cultures were not done at that time.  He has been simply admitted to the hospital for ongoing fevers and drenching sweats.  Repeat blood culture taken on admission in the context of being on levofloxacin.  Staph hominis is subsequently worked up at her request which shows it to be methicillin-resistant but fluoroquinolone sensitive.  CT chest and pelvis performed showing possible right lower lobe pneumonia  #1 Staphylococcus hominis + culture:  This could be a contaminant or it could have been a true positive on culture.  Fact that 2nd culture never done and that he was changed from agent not active vs this species to one that was without blood culture makes her potation of admission cultures complicated since he was on a fluoroquinolone  TTE vegetations TEE planned for Friday he tells [name].  I am going to continue did not target this organism for now while he is in the hospital we can observe him.  I sent repeat blood cultures today  #2 R sided pneumonia: will start augmentin for 5 day course  #3 Fevers: my clinical suspicion is that he is simply having recurrent fevers in the context of neutropenia  #4 CLL chemotherapy currently on hold. Dr [name] following closely  I spent 36 minutes with the patient including greater than 50% of the time in  face to face counseling  of the patient regarding the work-up of his positive blood culture his neutropenia pancytopenia his findings on CT scan of right lower lobe infiltrate, personally reviewing along with review of his CT chest abdomen pelvis from the 31st, review of his blood culture data, CBC differential CMP negative hepatitis panel HIV negative fourth-generation test, along with review of medical records before and during the visit and in coordination of his care.    LOS: 2 days   Alcide Evener 07/11/2021, 3:18 PM

## 2021-07-12 ENCOUNTER — Encounter (HOSPITAL_COMMUNITY): Payer: Self-pay | Admitting: Internal Medicine

## 2021-07-12 ENCOUNTER — Ambulatory Visit: Payer: Medicare Other | Admitting: Psychology

## 2021-07-12 DIAGNOSIS — Z86711 Personal history of pulmonary embolism: Secondary | ICD-10-CM | POA: Diagnosis not present

## 2021-07-12 DIAGNOSIS — Z952 Presence of prosthetic heart valve: Secondary | ICD-10-CM | POA: Diagnosis not present

## 2021-07-12 DIAGNOSIS — C9110 Chronic lymphocytic leukemia of B-cell type not having achieved remission: Secondary | ICD-10-CM | POA: Diagnosis not present

## 2021-07-12 DIAGNOSIS — D709 Neutropenia, unspecified: Secondary | ICD-10-CM | POA: Diagnosis not present

## 2021-07-12 DIAGNOSIS — R7881 Bacteremia: Secondary | ICD-10-CM | POA: Diagnosis not present

## 2021-07-12 DIAGNOSIS — D61818 Other pancytopenia: Secondary | ICD-10-CM | POA: Diagnosis not present

## 2021-07-12 LAB — CBC WITH DIFFERENTIAL/PLATELET
Abs Immature Granulocytes: 0.02 10*3/uL (ref 0.00–0.07)
Basophils Absolute: 0 10*3/uL (ref 0.0–0.1)
Basophils Relative: 0 %
Eosinophils Absolute: 0 10*3/uL (ref 0.0–0.5)
Eosinophils Relative: 0 %
HCT: 30.9 % — ABNORMAL LOW (ref 39.0–52.0)
Hemoglobin: 10.4 g/dL — ABNORMAL LOW (ref 13.0–17.0)
Immature Granulocytes: 0 %
Lymphocytes Relative: 11 %
Lymphs Abs: 0.5 10*3/uL — ABNORMAL LOW (ref 0.7–4.0)
MCH: 32.2 pg (ref 26.0–34.0)
MCHC: 33.7 g/dL (ref 30.0–36.0)
MCV: 95.7 fL (ref 80.0–100.0)
Monocytes Absolute: 0.3 10*3/uL (ref 0.1–1.0)
Monocytes Relative: 6 %
Neutro Abs: 4.2 10*3/uL (ref 1.7–7.7)
Neutrophils Relative %: 83 %
Platelets: 68 10*3/uL — ABNORMAL LOW (ref 150–400)
RBC: 3.23 MIL/uL — ABNORMAL LOW (ref 4.22–5.81)
RDW: 13.5 % (ref 11.5–15.5)
WBC: 5.2 10*3/uL (ref 4.0–10.5)
nRBC: 0 % (ref 0.0–0.2)

## 2021-07-12 LAB — BASIC METABOLIC PANEL
Anion gap: 7 (ref 5–15)
BUN: 17 mg/dL (ref 8–23)
CO2: 25 mmol/L (ref 22–32)
Calcium: 8.8 mg/dL — ABNORMAL LOW (ref 8.9–10.3)
Chloride: 102 mmol/L (ref 98–111)
Creatinine, Ser: 1.26 mg/dL — ABNORMAL HIGH (ref 0.61–1.24)
GFR, Estimated: 59 mL/min — ABNORMAL LOW (ref >60)
Glucose, Bld: 98 mg/dL (ref 70–99)
Potassium: 3.6 mmol/L (ref 3.5–5.1)
Sodium: 134 mmol/L — ABNORMAL LOW (ref 135–145)

## 2021-07-12 LAB — RHEUMATOID FACTOR: Rheumatoid fact SerPl-aCnc: 10.6 IU/mL (ref <14.0)

## 2021-07-12 LAB — ANGIOTENSIN CONVERTING ENZYME: Angiotensin-Converting Enzyme: 77 U/L (ref 14–82)

## 2021-07-12 LAB — EPSTEIN-BARR VIRUS (EBV) ANTIBODY PROFILE
EBV NA IgG: 91.1 U/mL — ABNORMAL HIGH (ref 0.0–17.9)
EBV VCA IgG: >600 U/mL — ABNORMAL HIGH (ref 0.0–17.9)
EBV VCA IgM: 42.9 U/mL — ABNORMAL HIGH (ref 0.0–35.9)

## 2021-07-12 LAB — CMV IGM: CMV IgM: <30 AU/mL (ref 0.0–29.9)

## 2021-07-12 LAB — CMV ANTIBODY, IGG (EIA): CMV Ab - IgG: >10 U/mL — ABNORMAL HIGH (ref 0.00–0.59)

## 2021-07-12 MED ORDER — ENOXAPARIN SODIUM 100 MG/ML IJ SOSY: 1.0000 mg/kg | PREFILLED_SYRINGE | Freq: Two times a day (BID) | INTRAMUSCULAR | Status: DC

## 2021-07-12 NOTE — Progress Notes (Signed)
Progress Note  Patient Name: [name] Date of Encounter: 07/12/2021  [facility] Cardiologist: Jenkins Rouge, MD   Subjective   Denies any chest pain, dyspnea, lightheadedness.  BP 110/72, continues to have intermittent fevers.  Platelet count stable at 68  Inpatient Medications    Scheduled Meds:  amoxicillin-clavulanate  1 tablet Oral Q12H   cholecalciferol  2,000 Units Oral q morning   enoxaparin (LOVENOX) injection  1 mg/kg Subcutaneous Q12H   hydrochlorothiazide  12.5 mg Oral Daily   meclizine  25 mg Oral q morning   omeprazole  40 mg Oral QPM   rosuvastatin  10 mg Oral QHS   sertraline  100 mg Oral q morning   Continuous Infusions:  PRN Meds: acetaminophen **OR** acetaminophen, bisacodyl, ipratropium, LORazepam, metoprolol tartrate, ondansetron **OR** ondansetron (ZOFRAN) IV, senna-docusate   Vital Signs    Vitals:   07/11/21 1656 07/11/21 2045 07/11/21 2318 07/12/21 0337  BP: 104/63 121/72 118/66 110/72  Pulse: 70 65 70 73  Resp: '20 18 18 18  '$ Temp: 98.7 F (37.1 C) 98.6 F (37 C) 99.2 F (37.3 C) 98.8 F (37.1 C)  TempSrc: Oral Oral Oral Oral  SpO2: 97% 99% 96% 93%  Weight:      Height:        Intake/Output Summary (Last 24 hours) at 07/12/2021 1043 Last data filed at 07/12/2021 0900 Gross per 24 hour  Intake 360 ml  Output --  Net 360 ml    Last 3 Weights 07/11/2021 07/09/2021 07/09/2021  Weight (lbs) 197 lb 12 oz 193 lb 193 lb  Weight (kg) 89.7 kg 87.544 kg 87.544 kg      Telemetry    Normal sinus rhythm rate 60s personally Reviewed  ECG    No new ECG - Personally Reviewed  Physical Exam   GEN: No acute distress.   Neck: No JVD Cardiac: RRR, 3/6 systolic murmur Respiratory: Clear to auscultation bilaterally. GI: Soft, nontender, non-distended  MS: No edema; No deformity. Neuro:  Nonfocal  Psych: Normal affect   Labs    High Sensitivity Troponin:  No results for input(s): TROPONINIHS in the last 720 hours.     Chemistry Recent Labs  Lab 07/05/21 1416 07/07/21 0605 07/09/21 0708 07/10/21 0521 07/11/21 0529 07/12/21 0540  NA 136 135   < > 135 139 134*  K 4.1 3.9   < > 3.6 3.8 3.6  CL 101 102   < > 102 103 102  CO2 26 25   < > '25 28 25  '$ GLUCOSE 124* 99   < > 105* 103* 98  BUN 22 22   < > '22 21 17  '$ CREATININE 1.74* 1.53*   < > 1.47* 1.49* 1.26*  CALCIUM 9.3 8.7*   < > 8.8* 9.1 8.8*  PROT 6.5 6.2*  --   --   --   --   ALBUMIN 4.0 3.7  --   --   --   --   AST 21 22  --   --   --   --   ALT 16 16  --   --   --   --   ALKPHOS 67 55  --   --   --   --   BILITOT 0.4 0.6  --   --   --   --   GFRNONAA 40* 47*   < > 49* 49* 59*  ANIONGAP 9 8   < > '8 8 7   '$ < > =  values in this interval not displayed.      Hematology Recent Labs  Lab 07/10/21 0521 07/11/21 0529 07/12/21 0815  WBC 1.8* 6.8 5.2  RBC 3.43* 3.39* 3.23*  HGB 11.0* 10.9* 10.4*  HCT 34.0* 32.7* 30.9*  MCV 99.1 96.5 95.7  MCH 32.1 32.2 32.2  MCHC 32.4 33.3 33.7  RDW 13.7 13.5 13.5  PLT 77* 69* 68*     BNPNo results for input(s): BNP, PROBNP in the last 168 hours.   DDimer No results for input(s): DDIMER in the last 168 hours.   Radiology    CT CHEST W CONTRAST  Result Date: 07/10/2021 CLINICAL DATA:  Fever of unknown origin. History of chronic lymphocytic leukemia. EXAM: CT CHEST, ABDOMEN, AND PELVIS WITH CONTRAST TECHNIQUE: Multidetector CT imaging of the chest, abdomen and pelvis was performed following the standard protocol during bolus administration of intravenous contrast. CONTRAST:  46m OMNIPAQUE IOHEXOL 350 MG/ML SOLN COMPARISON:  October 18, 2020. FINDINGS: CT CHEST FINDINGS Cardiovascular: Status post aortic valve repair. Atherosclerosis of thoracic aorta is noted without aneurysm or dissection. Normal cardiac size. No pericardial effusion. Mediastinum/Nodes: Thyroid gland is unremarkable. The esophagus is unremarkable. 1.9 cm precarinal lymph node is noted which is significantly enlarged compared to prior  exam. 8 mm aortopulmonary window node is noted which is enlarged compared to prior exam. 13 mm right paratracheal lymph node is noted which is enlarged compared to prior exam. 10 mm right supraclavicular lymph node is noted which is enlarged compared to prior exam. Lungs/Pleura: No pneumothorax or pleural effusion is noted. Stable scarring is noted in right lower lobe. Increased peribronchial opacity is noted in superior segment of right lower lobe concerning for focal inflammation. Musculoskeletal: No chest wall mass or suspicious bone lesions identified. CT ABDOMEN PELVIS FINDINGS Hepatobiliary: No focal liver abnormality is seen. No gallstones, gallbladder wall thickening, or biliary dilatation. Pancreas: Unremarkable. No pancreatic ductal dilatation or surrounding inflammatory changes. Spleen: Normal in size without focal abnormality. Adrenals/Urinary Tract: Adrenal glands appear normal. Bilateral renal cysts are noted. No hydronephrosis or renal obstruction is noted. Urinary bladder is unremarkable. Stomach/Bowel: Stomach is within normal limits. Appendix appears normal. No evidence of bowel wall thickening, distention, or inflammatory changes. Vascular/Lymphatic: Aortic atherosclerosis. 15 x 11 mm lymph node is seen to the left of the superior mesenteric trunk which is slightly smaller compared to prior exam. 7 mm lymph node is noted anterior to the abdominal aorta which is unchanged compared to prior exam 9 mm right periaortic lymph node is noted which is enlarged compared to prior exam. 1 cm left periaortic lymph node is noted just inferior to the splenic vein, which is enlarged compared to prior. Reproductive: Status post prostatectomy. Other: No abdominal wall hernia or abnormality. No abdominopelvic ascites. Musculoskeletal: No acute or significant osseous findings. IMPRESSION: Increased opacity is seen involving the superior segment of right lower lobe concerning for focal pneumonia or other  inflammation. Mildly enlarged mediastinal, supraclavicular and periaortic adenopathy is noted concerning for malignancy given the patient's history of chronic lymphocytic leukemia. Aortic Atherosclerosis (ICD10-I70.0). Electronically Signed   By: JMarijo ConceptionM.D.   On: 07/10/2021 20:34   CT ABDOMEN PELVIS W CONTRAST  Result Date: 07/10/2021 CLINICAL DATA:  Fever of unknown origin. History of chronic lymphocytic leukemia. EXAM: CT CHEST, ABDOMEN, AND PELVIS WITH CONTRAST TECHNIQUE: Multidetector CT imaging of the chest, abdomen and pelvis was performed following the standard protocol during bolus administration of intravenous contrast. CONTRAST:  845mOMNIPAQUE IOHEXOL 350 MG/ML SOLN COMPARISON:  October 18, 2020. FINDINGS: CT CHEST FINDINGS Cardiovascular: Status post aortic valve repair. Atherosclerosis of thoracic aorta is noted without aneurysm or dissection. Normal cardiac size. No pericardial effusion. Mediastinum/Nodes: Thyroid gland is unremarkable. The esophagus is unremarkable. 1.9 cm precarinal lymph node is noted which is significantly enlarged compared to prior exam. 8 mm aortopulmonary window node is noted which is enlarged compared to prior exam. 13 mm right paratracheal lymph node is noted which is enlarged compared to prior exam. 10 mm right supraclavicular lymph node is noted which is enlarged compared to prior exam. Lungs/Pleura: No pneumothorax or pleural effusion is noted. Stable scarring is noted in right lower lobe. Increased peribronchial opacity is noted in superior segment of right lower lobe concerning for focal inflammation. Musculoskeletal: No chest wall mass or suspicious bone lesions identified. CT ABDOMEN PELVIS FINDINGS Hepatobiliary: No focal liver abnormality is seen. No gallstones, gallbladder wall thickening, or biliary dilatation. Pancreas: Unremarkable. No pancreatic ductal dilatation or surrounding inflammatory changes. Spleen: Normal in size without focal abnormality.  Adrenals/Urinary Tract: Adrenal glands appear normal. Bilateral renal cysts are noted. No hydronephrosis or renal obstruction is noted. Urinary bladder is unremarkable. Stomach/Bowel: Stomach is within normal limits. Appendix appears normal. No evidence of bowel wall thickening, distention, or inflammatory changes. Vascular/Lymphatic: Aortic atherosclerosis. 15 x 11 mm lymph node is seen to the left of the superior mesenteric trunk which is slightly smaller compared to prior exam. 7 mm lymph node is noted anterior to the abdominal aorta which is unchanged compared to prior exam 9 mm right periaortic lymph node is noted which is enlarged compared to prior exam. 1 cm left periaortic lymph node is noted just inferior to the splenic vein, which is enlarged compared to prior. Reproductive: Status post prostatectomy. Other: No abdominal wall hernia or abnormality. No abdominopelvic ascites. Musculoskeletal: No acute or significant osseous findings. IMPRESSION: Increased opacity is seen involving the superior segment of right lower lobe concerning for focal pneumonia or other inflammation. Mildly enlarged mediastinal, supraclavicular and periaortic adenopathy is noted concerning for malignancy given the patient's history of chronic lymphocytic leukemia. Aortic Atherosclerosis (ICD10-I70.0). Electronically Signed   By: Marijo Conception M.D.   On: 07/10/2021 20:34   ECHOCARDIOGRAM COMPLETE  Result Date: 07/10/2021    ECHOCARDIOGRAM REPORT   Patient Name:   [name] Date of Exam: 07/10/2021 Medical Rec #:  [id number]      Height:       70.0 in Accession #:    [id number]     Weight:       193.0 lb Date of Birth:  03-06-1946      BSA:          2.056 m Patient Age:    75 years       BP:           132/82 mmHg Patient Gender: M              HR:           83 bpm. Exam Location:  Inpatient Procedure: 2D Echo, Cardiac Doppler and Color Doppler Indications:    Bacteremia  History:        Patient has prior history of  Echocardiogram examinations, most                 recent 06/21/2020. Arrythmias:Atrial Fibrillation,                 Signs/Symptoms:Bacteremia; Risk Factors:Dyslipidemia. TAVR,  afib,HLD.                 Aortic Valve: 25 mm Carpentier bioprosthetic valve is present in                 the aortic position. Procedure Date: 2011.  Sonographer:    MH Referring Phys: [place]  1. Left ventricular ejection fraction, by estimation, is 55 to 60%. The left ventricle has normal function. The left ventricle has no regional wall motion abnormalities. There is moderate concentric left ventricular hypertrophy. Left ventricular diastolic parameters are indeterminate.  2. Right ventricular systolic function is normal. The right ventricular size is normal. There is normal pulmonary artery systolic pressure.  3. Left atrial size was mildly dilated.  4. The mitral valve is normal in structure. Mild mitral valve regurgitation.  5. No vegetation or perivalvular abscess is seen. The aortic valve has been repaired/replaced. Aortic valve regurgitation is not visualized. There is a 25 mm Carpentier bioprosthetic valve present in the aortic position. Procedure Date: 2011. Echo findings are consistent with stenosis of the aortic prosthesis. Aortic valve mean gradient measures 46.5 mmHg. Aortic valve Vmax measures 4.38 m/s. Aortic valve acceleration time measures 90 msec.  6. There is moderate dilatation of the ascending aorta, measuring 47 mm. Comparison(s): Prior images reviewed side by side. Changes from prior study are noted. The transprosthetic gradients are markedly increased, in the range of severe stenosis. This is a chronic abnormality. Although the gradients are higher since 12/13/2020, the dimensionless index and the calculated effective valve area are unchanged. This suggests that the higher gradients are due to an increase in cardiac output, superimposed on chronic aortic prosthesis  mismatch/stenosis,. Conclusion(s)/Recommendation(s): No evidence of valvular vegetations on this transthoracic echocardiogram. Would recommend a transesophageal echocardiogram to exclude infective endocarditis if clinically indicated. FINDINGS  Left Ventricle: Left ventricular ejection fraction, by estimation, is 55 to 60%. The left ventricle has normal function. The left ventricle has no regional wall motion abnormalities. The left ventricular internal cavity size was normal in size. There is  moderate concentric left ventricular hypertrophy. Left ventricular diastolic parameters are indeterminate. Right Ventricle: The right ventricular size is normal. No increase in right ventricular wall thickness. Right ventricular systolic function is normal. There is normal pulmonary artery systolic pressure. The tricuspid regurgitant velocity is 2.71 m/s, and  with an assumed right atrial pressure of 3 mmHg, the estimated right ventricular systolic pressure is [id number] mmHg. Left Atrium: Left atrial size was mildly dilated. Right Atrium: Right atrial size was normal in size. Pericardium: There is no evidence of pericardial effusion. Mitral Valve: The mitral valve is normal in structure. Mild mitral annular calcification. Mild mitral valve regurgitation. Tricuspid Valve: Diastolic TR is seen due to 1st degree AV block. The tricuspid valve is normal in structure. Tricuspid valve regurgitation is trivial. Aortic Valve: No vegetation or perivalvular abscess is seen. The aortic valve has been repaired/replaced. Aortic valve regurgitation is not visualized. Aortic valve mean gradient measures 46.5 mmHg. Aortic valve peak gradient measures 76.9 mmHg. Aortic valve area, by VTI measures 0.99 cm. There is a 25 mm Carpentier bioprosthetic valve present in the aortic position. Procedure Date: 2011. Pulmonic Valve: The pulmonic valve was grossly normal. Pulmonic valve regurgitation is not visualized. Aorta: The aortic root is normal in size  and structure. There is moderate dilatation of the ascending aorta, measuring 47 mm. IAS/Shunts: No atrial level shunt detected by color flow Doppler.  LEFT VENTRICLE PLAX 2D LVIDd:  5.00 cm     Diastology LVIDs:         3.50 cm     LV e' medial:    7.51 cm/s LV PW:         1.60 cm     LV E/e' medial:  12.0 LV IVS:        1.60 cm     LV e' lateral:   10.80 cm/s LVOT diam:     2.30 cm     LV E/e' lateral: 8.4 LV SV:         88 LV SV Index:   43 LVOT Area:     4.15 cm  LV Volumes (MOD) LV vol d, MOD A4C: 82.7 ml LV vol s, MOD A4C: 26.7 ml LV SV MOD A4C:     82.7 ml RIGHT VENTRICLE RV S prime:     14.60 cm/s TAPSE (M-mode): 2.0 cm LEFT ATRIUM             Index       RIGHT ATRIUM           Index LA diam:        3.80 cm 1.85 cm/m  RA Area:     15.00 cm LA Vol (A2C):   48.4 ml 23.54 ml/m RA Volume:   29.70 ml  14.45 ml/m LA Vol (A4C):   54.4 ml 26.46 ml/m LA Biplane Vol: 51.8 ml 25.20 ml/m  AORTIC VALVE AV Area (Vmax):    1.07 cm AV Area (Vmean):   1.46 cm AV Area (VTI):     0.99 cm AV Vmax:           438.43 cm/s AV Vmean:          216.980 cm/s AV VTI:            0.889 m AV Peak Grad:      76.9 mmHg AV Mean Grad:      46.5 mmHg LVOT Vmax:         113.00 cm/s LVOT Vmean:        76.500 cm/s LVOT VTI:          0.211 m LVOT/AV VTI ratio: 0.24  AORTA Ao Root diam: 3.60 cm Ao Asc diam:  4.70 cm MITRAL VALVE               TRICUSPID VALVE MV Area (PHT): 3.61 cm    TR Peak grad:   29.4 mmHg MV E velocity: 90.41 cm/s  TR Vmax:        271.00 cm/s MV A velocity: 66.80 cm/s MV E/A ratio:  1.35        SHUNTS                            Systemic VTI:  0.21 m                            Systemic Diam: 2.30 cm Sanda Klein MD Electronically signed by Sanda Klein MD Signature Date/Time: 07/10/2021/1:38:57 PM    Final     Cardiac Studies   Echo 8/30:  1. Left ventricular ejection fraction, by estimation, is 55 to 60%. The  left ventricle has normal function. The left ventricle has no regional  wall motion  abnormalities. There is moderate concentric left ventricular  hypertrophy. Left ventricular  diastolic parameters are indeterminate.   2. Right ventricular systolic function is  normal. The right ventricular  size is normal. There is normal pulmonary artery systolic pressure.   3. Left atrial size was mildly dilated.   4. The mitral valve is normal in structure. Mild mitral valve  regurgitation.   5. No vegetation or perivalvular abscess is seen. The aortic valve has  been repaired/replaced. Aortic valve regurgitation is not visualized.  There is a 25 mm Carpentier bioprosthetic valve present in the aortic  position. Procedure Date: 2011. Echo  findings are consistent with stenosis of the aortic prosthesis. Aortic  valve mean gradient measures 46.5 mmHg. Aortic valve Vmax measures 4.38  m/s. Aortic valve acceleration time measures 90 msec.   6. There is moderate dilatation of the ascending aorta, measuring 47 mm.   Patient Profile     75 y.o. male with a hx of LL, pancytopenia, pulmonary emboli on warfarin, bioprosthetic aortic valve replacement in 2011 who is being seen 07/10/2021 for the evaluation of endocarditis   Assessment & Plan     S/p bioprosthetic AV: He underwent bioprosthetic AVR in July 2011 for severe AI at [facility].  Echo 08/2019 showed increased gradients through aortic valve up to 25 mmHg.  CT showed HALT/HAM, was started on warfarin.  Echo 12/15/2019 showed mean gradients 39 mmHg.  Echo 12/13/2020 showed mean gradient 28 mmHg, DI 0.24, AVA 1.2 cm.  Echocardiogram on 8/30 shows EF 55 to 60%, normal RV function, no vegetation or perivalvular abscess seen, aortic valve mean gradient measures 47 mmHg, V-max 4.4 m/s, acceleration time 90 ms.  Compared to echo 12/13/2020, has higher gradients but unchanged dimensionless index and calculated effective valve area, which along with low acceleration time, suggest increased gradients to high output state in setting of chronic aortic prosthesis  mismatch/stenosis.   -Recommend TEE to exclude endocarditis.  This has been scheduled for 9/2 (next available time).  After careful review of history and examination, the risks and benefits of transesophageal echocardiogram have been explained including risks of esophageal damage, perforation (1:10,000 risk), bleeding, pharyngeal hematoma as well as other potential complications associated with conscious sedation including aspiration, arrhythmia, respiratory failure and death. Alternatives to treatment were discussed, questions were answered. Patient is willing to proceed.     Fevers/Staph bacteremia: Blood cultures on 8/7 growing staph hominis.  ID consulted.  He is on Augmentin.   AKI on CKD: Baseline creatinine 1.5, was up to 2.0 on admission.  Has improved to baseline   CLL: Has pancytopenia.  Platelet count 68 today, should be okay for TEE provided platelets above 50   History of recurrent DVT/PE: On warfarin, has been held and starting Lovenox injections.  Will plan to hold lovenox for TEE on Friday given patient is at increased bleeding risk with thrombocytopenia   PAF: in sinus rhythm.  On warfarin.  Starting Lovenox as above   Hypertension: On hydrochlorothiazide.  Appears controlled\   For questions or updates, please contact [place] Please consult [email or web address] for contact info under        Signed, Donato Heinz, MD  07/12/2021, 10:43 AM

## 2021-07-12 NOTE — Progress Notes (Addendum)
IV leaking, removed per pt request. Patient said he do not want a new site at this time.

## 2021-07-12 NOTE — Progress Notes (Signed)
HEMATOLOGY-ONCOLOGY PROGRESS NOTE  SUBJECTIVE: The patient reports that he is feeling better today.  He did have a fever up to 102.8 yesterday around noon.  Repeat blood cultures obtained yesterday are negative so far.  He has no other complaints today.  Due for TEE tomorrow.   REVIEW OF SYSTEMS:   Constitutional: He had a fever yesterday, none so far today Eyes: Denies blurriness of vision Ears, nose, mouth, throat, and face: Denies mucositis or sore throat Respiratory: Denies cough, dyspnea or wheezes Cardiovascular: Denies palpitation, chest discomfort Gastrointestinal:  Denies nausea, heartburn or change in bowel habits Skin: Denies abnormal skin rashes Lymphatics: Denies new lymphadenopathy or easy bruising Neurological:Denies numbness, tingling or new weaknesses Behavioral/Psych: Mood is stable, no new changes  Extremities: No lower extremity edema All other systems were reviewed with the patient and are negative.  I have reviewed the past medical history, past surgical history, social history and family history with the patient and they are unchanged from previous note.   PHYSICAL EXAMINATION: ECOG PERFORMANCE STATUS: 1 - Symptomatic but completely ambulatory  Vitals:   07/11/21 2318 07/12/21 0337  BP: 118/66 110/72  Pulse: 70 73  Resp: 18 18  Temp: 99.2 F (37.3 C) 98.8 F (37.1 C)  SpO2: 96% 93%   Filed Weights   07/09/21 0639 07/11/21 0405  Weight: 87.5 kg 89.7 kg    Intake/Output from previous day: 08/31 0701 - 09/01 0700 In: 480 [P.O.:480] Out: -   GENERAL:alert, no distress and comfortable SKIN: skin color, texture, turgor are normal, no rashes or significant lesions EYES: normal, Conjunctiva are pink and non-injected, sclera clear LUNGS: clear to auscultation and percussion with normal breathing effort HEART: regular rate & rhythm and no murmurs and no lower extremity edema ABDOMEN:abdomen soft, non-tender and normal bowel sounds NEURO: alert &  oriented x 3 with fluent speech, no focal motor/sensory deficits  LABORATORY DATA:  I have reviewed the data as listed CMP Latest Ref Rng & Units 07/12/2021 07/11/2021 07/10/2021  Glucose 70 - 99 mg/dL 98 103(H) 105(H)  BUN 8 - 23 mg/dL _0 Creatinine 0.61 - 1.24 mg/dL 1.26(H) 1.49(H) 1.47(H)  Sodium 135 - 145 mmol/L 134(L) 139 135  Potassium 3.5 - 5.1 mmol/L 3.6 3.8 3.6  Chloride 98 - 111 mmol/L 102 103 102  CO2 22 - 32 mmol/L _1 Calcium 8.9 - 10.3 mg/dL 8.8(L) 9.1 8.8(L)  Total Protein 6.5 - 8.1 g/dL - - -  Total Bilirubin 0.3 - 1.2 mg/dL - - -  Alkaline Phos 38 - 126 U/L - - -  AST 15 - 41 U/L - - -  ALT 0 - 44 U/L - - -    Lab Results  Component Value Date   WBC 5.2 07/12/2021   HGB 10.4 (L) 07/12/2021   HCT 30.9 (L) 07/12/2021   MCV 95.7 07/12/2021   PLT 68 (L) 07/12/2021   NEUTROABS 4.2 07/12/2021    DG Chest 2 View  Result Date: 07/08/2021 CLINICAL DATA:  Fever. EXAM: CHEST - 2 VIEW COMPARISON:  July 07, 2021. FINDINGS: The heart size and mediastinal contours are within normal limits. Both lungs are clear. Status post aortic valve repair. The visualized skeletal structures are unremarkable. IMPRESSION: No active cardiopulmonary disease. Electronically Signed   By: Marijo Conception M.D.   On: 07/08/2021 14:45   DG Chest 2 View  Result Date: 07/07/2021 CLINICAL DATA:  75 year old male with possible sepsis.  Fever.  CLL. EXAM: CHEST -  2 VIEW COMPARISON:  Chest radiographs 06/18/2021 and earlier. FINDINGS: Chronic sternotomy and cardiac valve replacement. Normal cardiac size and mediastinal contours. Visualized tracheal air column is within normal limits. Lung volumes are within normal limits. No pneumothorax, pulmonary edema, pleural effusion or confluent pulmonary opacity. No acute osseous abnormality identified. Negative visible bowel gas pattern. Stable small dystrophic calcifications or retained radiopaque foreign bodies at the right axilla. IMPRESSION: No acute  cardiopulmonary abnormality. Electronically Signed   By: Genevie Ann M.D.   On: 07/07/2021 06:48   DG Chest 2 View  Result Date: 06/18/2021 CLINICAL DATA:  Questionable sepsis - evaluate for abnormality Fever. EXAM: CHEST - 2 VIEW COMPARISON:  Most recent radiograph 05/29/2021 FINDINGS: Median sternotomy wires again seen. Prosthetic aortic valve. Normal heart size with stable mediastinal contours. No focal airspace disease or evidence of pneumonia. No pulmonary edema. No pleural fluid or pneumothorax. No acute osseous abnormalities are seen. Punctate densities again project over the right upper arm. IMPRESSION: No acute chest findings.  No evidence of pneumonia. Electronically Signed   By: Keith Rake M.D.   On: 06/18/2021 23:55   CT CHEST W CONTRAST  Result Date: 07/10/2021 CLINICAL DATA:  Fever of unknown origin. History of chronic lymphocytic leukemia. EXAM: CT CHEST, ABDOMEN, AND PELVIS WITH CONTRAST TECHNIQUE: Multidetector CT imaging of the chest, abdomen and pelvis was performed following the standard protocol during bolus administration of intravenous contrast. CONTRAST:  77m OMNIPAQUE IOHEXOL 350 MG/ML SOLN COMPARISON:  October 18, 2020. FINDINGS: CT CHEST FINDINGS Cardiovascular: Status post aortic valve repair. Atherosclerosis of thoracic aorta is noted without aneurysm or dissection. Normal cardiac size. No pericardial effusion. Mediastinum/Nodes: Thyroid gland is unremarkable. The esophagus is unremarkable. 1.9 cm precarinal lymph node is noted which is significantly enlarged compared to prior exam. 8 mm aortopulmonary window node is noted which is enlarged compared to prior exam. 13 mm right paratracheal lymph node is noted which is enlarged compared to prior exam. 10 mm right supraclavicular lymph node is noted which is enlarged compared to prior exam. Lungs/Pleura: No pneumothorax or pleural effusion is noted. Stable scarring is noted in right lower lobe. Increased peribronchial opacity is  noted in superior segment of right lower lobe concerning for focal inflammation. Musculoskeletal: No chest wall mass or suspicious bone lesions identified. CT ABDOMEN PELVIS FINDINGS Hepatobiliary: No focal liver abnormality is seen. No gallstones, gallbladder wall thickening, or biliary dilatation. Pancreas: Unremarkable. No pancreatic ductal dilatation or surrounding inflammatory changes. Spleen: Normal in size without focal abnormality. Adrenals/Urinary Tract: Adrenal glands appear normal. Bilateral renal cysts are noted. No hydronephrosis or renal obstruction is noted. Urinary bladder is unremarkable. Stomach/Bowel: Stomach is within normal limits. Appendix appears normal. No evidence of bowel wall thickening, distention, or inflammatory changes. Vascular/Lymphatic: Aortic atherosclerosis. 15 x 11 mm lymph node is seen to the left of the superior mesenteric trunk which is slightly smaller compared to prior exam. 7 mm lymph node is noted anterior to the abdominal aorta which is unchanged compared to prior exam 9 mm right periaortic lymph node is noted which is enlarged compared to prior exam. 1 cm left periaortic lymph node is noted just inferior to the splenic vein, which is enlarged compared to prior. Reproductive: Status post prostatectomy. Other: No abdominal wall hernia or abnormality. No abdominopelvic ascites. Musculoskeletal: No acute or significant osseous findings. IMPRESSION: Increased opacity is seen involving the superior segment of right lower lobe concerning for focal pneumonia or other inflammation. Mildly enlarged mediastinal, supraclavicular and periaortic adenopathy is noted  concerning for malignancy given the patient's history of chronic lymphocytic leukemia. Aortic Atherosclerosis (ICD10-I70.0). Electronically Signed   By: Marijo Conception M.D.   On: 07/10/2021 20:34   CT ABDOMEN PELVIS W CONTRAST  Result Date: 07/10/2021 CLINICAL DATA:  Fever of unknown origin. History of chronic  lymphocytic leukemia. EXAM: CT CHEST, ABDOMEN, AND PELVIS WITH CONTRAST TECHNIQUE: Multidetector CT imaging of the chest, abdomen and pelvis was performed following the standard protocol during bolus administration of intravenous contrast. CONTRAST:  87m OMNIPAQUE IOHEXOL 350 MG/ML SOLN COMPARISON:  October 18, 2020. FINDINGS: CT CHEST FINDINGS Cardiovascular: Status post aortic valve repair. Atherosclerosis of thoracic aorta is noted without aneurysm or dissection. Normal cardiac size. No pericardial effusion. Mediastinum/Nodes: Thyroid gland is unremarkable. The esophagus is unremarkable. 1.9 cm precarinal lymph node is noted which is significantly enlarged compared to prior exam. 8 mm aortopulmonary window node is noted which is enlarged compared to prior exam. 13 mm right paratracheal lymph node is noted which is enlarged compared to prior exam. 10 mm right supraclavicular lymph node is noted which is enlarged compared to prior exam. Lungs/Pleura: No pneumothorax or pleural effusion is noted. Stable scarring is noted in right lower lobe. Increased peribronchial opacity is noted in superior segment of right lower lobe concerning for focal inflammation. Musculoskeletal: No chest wall mass or suspicious bone lesions identified. CT ABDOMEN PELVIS FINDINGS Hepatobiliary: No focal liver abnormality is seen. No gallstones, gallbladder wall thickening, or biliary dilatation. Pancreas: Unremarkable. No pancreatic ductal dilatation or surrounding inflammatory changes. Spleen: Normal in size without focal abnormality. Adrenals/Urinary Tract: Adrenal glands appear normal. Bilateral renal cysts are noted. No hydronephrosis or renal obstruction is noted. Urinary bladder is unremarkable. Stomach/Bowel: Stomach is within normal limits. Appendix appears normal. No evidence of bowel wall thickening, distention, or inflammatory changes. Vascular/Lymphatic: Aortic atherosclerosis. 15 x 11 mm lymph node is seen to the left of the  superior mesenteric trunk which is slightly smaller compared to prior exam. 7 mm lymph node is noted anterior to the abdominal aorta which is unchanged compared to prior exam 9 mm right periaortic lymph node is noted which is enlarged compared to prior exam. 1 cm left periaortic lymph node is noted just inferior to the splenic vein, which is enlarged compared to prior. Reproductive: Status post prostatectomy. Other: No abdominal wall hernia or abnormality. No abdominopelvic ascites. Musculoskeletal: No acute or significant osseous findings. IMPRESSION: Increased opacity is seen involving the superior segment of right lower lobe concerning for focal pneumonia or other inflammation. Mildly enlarged mediastinal, supraclavicular and periaortic adenopathy is noted concerning for malignancy given the patient's history of chronic lymphocytic leukemia. Aortic Atherosclerosis (ICD10-I70.0). Electronically Signed   By: JMarijo ConceptionM.D.   On: 07/10/2021 20:34   CT BIOPSY  Result Date: 06/22/2021 INDICATION: 75year old with CLL.  Request for bone marrow biopsy. EXAM: CT GUIDED BONE MARROW ASPIRATES AND BIOPSY Physician: AStephan Minister HAnselm Pancoast MD MEDICATIONS: None. ANESTHESIA/SEDATION: Fentanyl 100 mcg IV; Versed 2.0 mg IV Moderate Sedation Time:  10 minutes The patient was continuously monitored during the procedure by the interventional radiology nurse under my direct supervision. COMPLICATIONS: None immediate. PROCEDURE: The procedure was explained to the patient. The risks and benefits of the procedure were discussed and the patient's questions were addressed. Informed consent was obtained from the patient. The patient was placed prone on CT table. Images of the pelvis were obtained. The side of back was prepped and draped in sterile fashion. The skin and right posterior ilium were anesthetized  with 1% lidocaine. OnControl bone needle was directed into the right ilium with CT guidance. Two aspirates and one core biopsy were  obtained. Bandage placed over the puncture site. IMPRESSION: CT guided bone marrow aspiration and core biopsy. Electronically Signed   By: Markus Daft M.D.   On: 06/22/2021 14:10   CT BONE MARROW BIOPSY & ASPIRATION  Result Date: 06/22/2021 INDICATION: 75 year old with CLL.  Request for bone marrow biopsy. EXAM: CT GUIDED BONE MARROW ASPIRATES AND BIOPSY Physician: Stephan Minister. Anselm Pancoast, MD MEDICATIONS: None. ANESTHESIA/SEDATION: Fentanyl 100 mcg IV; Versed 2.0 mg IV Moderate Sedation Time:  10 minutes The patient was continuously monitored during the procedure by the interventional radiology nurse under my direct supervision. COMPLICATIONS: None immediate. PROCEDURE: The procedure was explained to the patient. The risks and benefits of the procedure were discussed and the patient's questions were addressed. Informed consent was obtained from the patient. The patient was placed prone on CT table. Images of the pelvis were obtained. The side of back was prepped and draped in sterile fashion. The skin and right posterior ilium were anesthetized with 1% lidocaine. OnControl bone needle was directed into the right ilium with CT guidance. Two aspirates and one core biopsy were obtained. Bandage placed over the puncture site. IMPRESSION: CT guided bone marrow aspiration and core biopsy. Electronically Signed   By: Markus Daft M.D.   On: 06/22/2021 14:10   ECHOCARDIOGRAM COMPLETE  Result Date: 07/10/2021    ECHOCARDIOGRAM REPORT   Patient Name:   JARMAINE EHRLER Date of Exam: 07/10/2021 Medical Rec #:  [id number]      Height:       70.0 in Accession #:    [id number]     Weight:       193.0 lb Date of Birth:  06-07-1946      BSA:          2.056 m Patient Age:    75 years       BP:           132/82 mmHg Patient Gender: M              HR:           83 bpm. Exam Location:  Inpatient Procedure: 2D Echo, Cardiac Doppler and Color Doppler Indications:    Bacteremia  History:        Patient has prior history of Echocardiogram  examinations, most                 recent 06/21/2020. Arrythmias:Atrial Fibrillation,                 Signs/Symptoms:Bacteremia; Risk Factors:Dyslipidemia. TAVR,                 afib,HLD.                 Aortic Valve: 25 mm Carpentier bioprosthetic valve is present in                 the aortic position. Procedure Date: 2011.  Sonographer:    MH Referring Phys: Alicia  1. Left ventricular ejection fraction, by estimation, is 55 to 60%. The left ventricle has normal function. The left ventricle has no regional wall motion abnormalities. There is moderate concentric left ventricular hypertrophy. Left ventricular diastolic parameters are indeterminate.  2. Right ventricular systolic function is normal. The right ventricular size is normal. There is normal pulmonary artery systolic pressure.  3. Left atrial size  was mildly dilated.  4. The mitral valve is normal in structure. Mild mitral valve regurgitation.  5. No vegetation or perivalvular abscess is seen. The aortic valve has been repaired/replaced. Aortic valve regurgitation is not visualized. There is a 25 mm Carpentier bioprosthetic valve present in the aortic position. Procedure Date: 2011. Echo findings are consistent with stenosis of the aortic prosthesis. Aortic valve mean gradient measures 46.5 mmHg. Aortic valve Vmax measures 4.38 m/s. Aortic valve acceleration time measures 90 msec.  6. There is moderate dilatation of the ascending aorta, measuring 47 mm. Comparison(s): Prior images reviewed side by side. Changes from prior study are noted. The transprosthetic gradients are markedly increased, in the range of severe stenosis. This is a chronic abnormality. Although the gradients are higher since 12/13/2020, the dimensionless index and the calculated effective valve area are unchanged. This suggests that the higher gradients are due to an increase in cardiac output, superimposed on chronic aortic prosthesis mismatch/stenosis,.  Conclusion(s)/Recommendation(s): No evidence of valvular vegetations on this transthoracic echocardiogram. Would recommend a transesophageal echocardiogram to exclude infective endocarditis if clinically indicated. FINDINGS  Left Ventricle: Left ventricular ejection fraction, by estimation, is 55 to 60%. The left ventricle has normal function. The left ventricle has no regional wall motion abnormalities. The left ventricular internal cavity size was normal in size. There is  moderate concentric left ventricular hypertrophy. Left ventricular diastolic parameters are indeterminate. Right Ventricle: The right ventricular size is normal. No increase in right ventricular wall thickness. Right ventricular systolic function is normal. There is normal pulmonary artery systolic pressure. The tricuspid regurgitant velocity is 2.71 m/s, and  with an assumed right atrial pressure of 3 mmHg, the estimated right ventricular systolic pressure is 62.3 mmHg. Left Atrium: Left atrial size was mildly dilated. Right Atrium: Right atrial size was normal in size. Pericardium: There is no evidence of pericardial effusion. Mitral Valve: The mitral valve is normal in structure. Mild mitral annular calcification. Mild mitral valve regurgitation. Tricuspid Valve: Diastolic TR is seen due to 1st degree AV block. The tricuspid valve is normal in structure. Tricuspid valve regurgitation is trivial. Aortic Valve: No vegetation or perivalvular abscess is seen. The aortic valve has been repaired/replaced. Aortic valve regurgitation is not visualized. Aortic valve mean gradient measures 46.5 mmHg. Aortic valve peak gradient measures 76.9 mmHg. Aortic valve area, by VTI measures 0.99 cm. There is a 25 mm Carpentier bioprosthetic valve present in the aortic position. Procedure Date: 2011. Pulmonic Valve: The pulmonic valve was grossly normal. Pulmonic valve regurgitation is not visualized. Aorta: The aortic root is normal in size and structure. There  is moderate dilatation of the ascending aorta, measuring 47 mm. IAS/Shunts: No atrial level shunt detected by color flow Doppler.  LEFT VENTRICLE PLAX 2D LVIDd:         5.00 cm     Diastology LVIDs:         3.50 cm     LV e' medial:    7.51 cm/s LV PW:         1.60 cm     LV E/e' medial:  12.0 LV IVS:        1.60 cm     LV e' lateral:   10.80 cm/s LVOT diam:     2.30 cm     LV E/e' lateral: 8.4 LV SV:         88 LV SV Index:   43 LVOT Area:     4.15 cm  LV Volumes (MOD) LV vol  d, MOD A4C: 82.7 ml LV vol s, MOD A4C: 26.7 ml LV SV MOD A4C:     82.7 ml RIGHT VENTRICLE RV S prime:     14.60 cm/s TAPSE (M-mode): 2.0 cm LEFT ATRIUM             Index       RIGHT ATRIUM           Index LA diam:        3.80 cm 1.85 cm/m  RA Area:     15.00 cm LA Vol (A2C):   48.4 ml 23.54 ml/m RA Volume:   29.70 ml  14.45 ml/m LA Vol (A4C):   54.4 ml 26.46 ml/m LA Biplane Vol: 51.8 ml 25.20 ml/m  AORTIC VALVE AV Area (Vmax):    1.07 cm AV Area (Vmean):   1.46 cm AV Area (VTI):     0.99 cm AV Vmax:           438.43 cm/s AV Vmean:          216.980 cm/s AV VTI:            0.889 m AV Peak Grad:      76.9 mmHg AV Mean Grad:      46.5 mmHg LVOT Vmax:         113.00 cm/s LVOT Vmean:        76.500 cm/s LVOT VTI:          0.211 m LVOT/AV VTI ratio: 0.24  AORTA Ao Root diam: 3.60 cm Ao Asc diam:  4.70 cm MITRAL VALVE               TRICUSPID VALVE MV Area (PHT): 3.61 cm    TR Peak grad:   29.4 mmHg MV E velocity: 90.41 cm/s  TR Vmax:        271.00 cm/s MV A velocity: 66.80 cm/s MV E/A ratio:  1.35        SHUNTS                            Systemic VTI:  0.21 m                            Systemic Diam: 2.30 cm Dani Gobble Croitoru MD Electronically signed by Sanda Klein MD Signature Date/Time: 07/10/2021/1:38:57 PM    Final     ASSESSMENT AND PLAN: 1.  CLL 2.  Anemia 3.  Thrombocytopenia 4.  Staph hominis bacteremia-?  Contaminant versus true positive 5.  Right-sided pneumonia 6.  AKI on CKD 7.  Hypertension 8.  Depression/anxiety 9.   Hyperlipidemia  -Continues to have intermittent fevers.  Overall, he is feeling better.  May be related to right-sided pneumonia.  Unclear if he has true bacteremia versus contaminant.  He has on a course of Augmentin.  Due for TEE tomorrow.  Appreciate assistance from hospitalist team, ID, and cardiology. -Overall, his CBC is stable.  We will look at restarting his treatment for CLL as an outpatient.  He was previously on venetoclax.  May need to consider addition of Granix with his chemotherapy at least intermittently going forward.   LOS: 3 days   Mikey Bussing, DNP, AGPCNP-BC, AOCNP 07/12/21

## 2021-07-12 NOTE — Progress Notes (Signed)
   07/11/21 2045  Assess: MEWS Score  Temp 98.6 F (37 C)  BP 121/72  Pulse Rate 65  Resp 18  Level of Consciousness Alert  SpO2 99 %  O2 Device Room Air  Assess: MEWS Score  MEWS Temp 0  MEWS Systolic 0  MEWS Pulse 0  MEWS RR 0  MEWS LOC 0  MEWS Score 0  MEWS Score Color Green  Assess: if the MEWS score is Yellow or Red  Were vital signs taken at a resting state? Yes  Focused Assessment No change from prior assessment  Does the patient meet 2 or more of the SIRS criteria? No  Does the patient have a confirmed or suspected source of infection? No  Provider and Rapid Response Notified? No  MEWS guidelines implemented *See Row Information* No, previously yellow, continue vital signs every 4 hours  Treat  Pain Scale 0-10  Pain Score 0  Document  Patient Outcome Other (Comment) (remain on unit)  Progress note created (see row info) Yes  Assess: SIRS CRITERIA  SIRS Temperature  0  SIRS Pulse 0  SIRS Respirations  0  SIRS WBC 0  SIRS Score Sum  0  Patient is green mews and remain on the unit.

## 2021-07-12 NOTE — Progress Notes (Signed)
Subjective:  Patient is anxious to get back home.  Apparently also his dog which is a bearded poodle has been "having a pool party" at his house"   Antibiotics:  Anti-infectives (From admission, onward)    Start     Dose/Rate Route Frequency Ordered Stop   07/11/21 1200  amoxicillin-clavulanate (AUGMENTIN) 875-125 MG per tablet 1 tablet        1 tablet Oral Every 12 hours 07/11/21 1105 07/16/21 0959   07/09/21 1800  vancomycin (VANCOREADY) IVPB 1750 mg/350 mL  Status:  Discontinued        1,750 mg 175 mL/hr over 120 Minutes Intravenous  Once 07/09/21 1704 07/09/21 1728   07/09/21 0715  ceFAZolin (ANCEF) IVPB 2g/100 mL premix  Status:  Discontinued        2 g 200 mL/hr over 30 Minutes Intravenous Every 8 hours 07/09/21 0708 07/10/21 1229       Medications: Scheduled Meds:  amoxicillin-clavulanate  1 tablet Oral Q12H   cholecalciferol  2,000 Units Oral q morning   enoxaparin (LOVENOX) injection  1 mg/kg Subcutaneous Q12H   [START ON 07/13/2021] enoxaparin (LOVENOX) injection  1 mg/kg Subcutaneous Q12H   hydrochlorothiazide  12.5 mg Oral Daily   meclizine  25 mg Oral q morning   omeprazole  40 mg Oral QPM   rosuvastatin  10 mg Oral QHS   sertraline  100 mg Oral q morning   Continuous Infusions: PRN Meds:.acetaminophen **OR** acetaminophen, bisacodyl, ipratropium, LORazepam, metoprolol tartrate, ondansetron **OR** ondansetron (ZOFRAN) IV, senna-docusate    Objective: Weight change:   Intake/Output Summary (Last 24 hours) at 07/12/2021 1352 Last data filed at 07/12/2021 0900 Gross per 24 hour  Intake 120 ml  Output --  Net 120 ml    Blood pressure 117/73, pulse 64, temperature 98.5 F (36.9 C), temperature source Oral, resp. rate 18, height '5\' 10"'$  (1.778 m), weight 89.7 kg, SpO2 99 %. Temp:  [98.5 F (36.9 C)-99.3 F (37.4 C)] 98.5 F (36.9 C) (09/01 1332) Pulse Rate:  [64-73] 64 (09/01 1332) Resp:  [18-20] 18 (09/01 0337) BP: (104-121)/(63-73) 117/73  (09/01 1332) SpO2:  [93 %-99 %] 99 % (09/01 1332)  Physical Exam: Physical Exam Constitutional:      Appearance: He is well-developed.  HENT:     Head: Normocephalic and atraumatic.  Eyes:     General:        Right eye: No discharge.        Left eye: No discharge.     Extraocular Movements: Extraocular movements intact.     Conjunctiva/sclera: Conjunctivae normal.  Cardiovascular:     Rate and Rhythm: Normal rate and regular rhythm.     Heart sounds: Murmur heard.  Pulmonary:     Effort: Pulmonary effort is normal. No respiratory distress.     Breath sounds: Normal breath sounds. No stridor. No wheezing or rhonchi.  Abdominal:     General: There is no distension.     Palpations: Abdomen is soft.  Musculoskeletal:        General: Normal range of motion.     Cervical back: Normal range of motion and neck supple.  Skin:    General: Skin is warm and dry.     Findings: No erythema or rash.  Neurological:     General: No focal deficit present.     Mental Status: He is alert and oriented to person, place, and time.  Psychiatric:  Mood and Affect: Mood normal.        Behavior: Behavior normal.        Thought Content: Thought content normal.        Judgment: Judgment normal.     CBC:    BMET Recent Labs    07/11/21 0529 07/12/21 0540  NA 139 134*  K 3.8 3.6  CL 103 102  CO2 28 25  GLUCOSE 103* 98  BUN 21 17  CREATININE 1.49* 1.26*  CALCIUM 9.1 8.8*      Liver Panel  No results for input(s): PROT, ALBUMIN, AST, ALT, ALKPHOS, BILITOT, BILIDIR, IBILI in the last 72 hours.     Sedimentation Rate Recent Labs    07/11/21 0529  ESRSEDRATE 36*    C-Reactive Protein Recent Labs    07/11/21 0529  CRP 15.8*     Micro Results: Recent Results (from the past 720 hour(s))  Resp Panel by RT-PCR (Flu A&B, Covid) Nasopharyngeal Swab     Status: None   Collection Time: 06/18/21 12:37 AM   Specimen: Nasopharyngeal Swab; Nasopharyngeal(NP) swabs in vial  transport medium  Result Value Ref Range Status   SARS Coronavirus 2 by RT PCR NEGATIVE NEGATIVE Final    Comment: (NOTE) SARS-CoV-2 target nucleic acids are NOT DETECTED.  The SARS-CoV-2 RNA is generally detectable in upper respiratory specimens during the acute phase of infection. The lowest concentration of SARS-CoV-2 viral copies this assay can detect is 138 copies/mL. A negative result does not preclude SARS-Cov-2 infection and should not be used as the sole basis for treatment or other patient management decisions. A negative result may occur with  improper specimen collection/handling, submission of specimen other than nasopharyngeal swab, presence of viral mutation(s) within the areas targeted by this assay, and inadequate number of viral copies(<138 copies/mL). A negative result must be combined with clinical observations, patient history, and epidemiological information. The expected result is Negative.  Fact Sheet for Patients:  EntrepreneurPulse.com.au  Fact Sheet for Healthcare Providers:  IncredibleEmployment.be  This test is no t yet approved or cleared by the [place] FDA and  has been authorized for detection and/or diagnosis of SARS-CoV-2 by FDA under an Emergency Use Authorization (EUA). This EUA will remain  in effect (meaning this test can be used) for the duration of the COVID-19 declaration under Section 564(b)(1) of the Act, 21 U.S.C.section 360bbb-3(b)(1), unless the authorization is terminated  or revoked sooner.       Influenza A by PCR NEGATIVE NEGATIVE Final   Influenza B by PCR NEGATIVE NEGATIVE Final    Comment: (NOTE) The Xpert Xpress SARS-CoV-2/FLU/RSV plus assay is intended as an aid in the diagnosis of influenza from Nasopharyngeal swab specimens and should not be used as a sole basis for treatment. Nasal washings and aspirates are unacceptable for Xpert Xpress SARS-CoV-2/FLU/RSV testing.  Fact  Sheet for Patients: EntrepreneurPulse.com.au  Fact Sheet for Healthcare Providers: IncredibleEmployment.be  This test is not yet approved or cleared by the [place] FDA and has been authorized for detection and/or diagnosis of SARS-CoV-2 by FDA under an Emergency Use Authorization (EUA). This EUA will remain in effect (meaning this test can be used) for the duration of the COVID-19 declaration under Section 564(b)(1) of the Act, 21 U.S.C. section 360bbb-3(b)(1), unless the authorization is terminated or revoked.  Performed at [facility], [place] [address] [place], [place] [address]   Blood Culture (routine x 2)     Status: None   Collection Time: 06/18/21 10:50  PM   Specimen: BLOOD RIGHT ARM  Result Value Ref Range Status   Specimen Description BLOOD RIGHT ARM  Final   Special Requests   Final    BOTTLES DRAWN AEROBIC AND ANAEROBIC Blood Culture results may not be optimal due to an excessive volume of blood received in culture bottles   Culture   Final    NO GROWTH 5 DAYS Performed at [facility], [place] [address], [place], [place] [address]    Report Status 06/23/2021 FINAL  Final  Blood Culture (routine x 2)     Status: None   Collection Time: 06/18/21 11:00 PM   Specimen: BLOOD LEFT ARM  Result Value Ref Range Status   Specimen Description BLOOD LEFT ARM  Final   Special Requests   Final    BOTTLES DRAWN AEROBIC ONLY Blood Culture results may not be optimal due to an excessive volume of blood received in culture bottles   Culture   Final    NO GROWTH 5 DAYS Performed at [facility], [place] [address] [place], [place] [address]    Report Status 06/23/2021 FINAL  Final  Blood Culture (routine x 2)     Status: Abnormal   Collection Time: 07/07/21  6:05 AM   Specimen: BLOOD  Result Value Ref Range Status   Specimen Description   Final    BLOOD BLOOD RIGHT FOREARM Performed at [facility], [address] [place], [place] [address]    Special Requests   Final    BOTTLES DRAWN AEROBIC AND ANAEROBIC Blood Culture adequate volume Performed at [facility], [address], [place],  [address]    Culture  Setup Time   Final    GRAM POSITIVE COCCI IN CLUSTERS ANAEROBIC BOTTLE ONLY CRITICAL RESULT CALLED TO, READ BACK BY AND VERIFIED WITH: Mickie Hillier RN '@1007'$  07/08/21 EB Performed at [facility], [place] [address] [place], [place] [address]    Culture STAPHYLOCOCCUS HOMINIS (A)  Final   Report Status 07/11/2021 FINAL  Final   Organism ID, Bacteria STAPHYLOCOCCUS HOMINIS  Final      Susceptibility   Staphylococcus hominis - MIC*    CIPROFLOXACIN <=0.5 SENSITIVE Sensitive     ERYTHROMYCIN <=0.25 SENSITIVE Sensitive     GENTAMICIN <=0.5 SENSITIVE Sensitive     OXACILLIN RESISTANT Resistant     TETRACYCLINE <=1 SENSITIVE Sensitive     VANCOMYCIN <=0.5 SENSITIVE Sensitive     TRIMETH/SULFA 20 SENSITIVE Sensitive     CLINDAMYCIN <=0.25 SENSITIVE Sensitive     RIFAMPIN <=0.5 SENSITIVE Sensitive     Inducible Clindamycin NEGATIVE Sensitive     * STAPHYLOCOCCUS HOMINIS  Resp Panel by RT-PCR (Flu A&B, Covid) Peripheral     Status: None   Collection Time: 07/07/21  6:05 AM   Specimen: Peripheral; Nasopharyngeal(NP) swabs in vial transport medium  Result Value Ref Range Status   SARS Coronavirus 2 by RT PCR NEGATIVE NEGATIVE Final    Comment: (NOTE) SARS-CoV-2 target nucleic acids are NOT DETECTED.  The SARS-CoV-2 RNA is generally detectable in upper respiratory specimens during the acute phase of infection. The lowest concentration of SARS-CoV-2 viral copies this assay can detect is 138 copies/mL. A negative result does not preclude SARS-Cov-2 infection and should not be used as the sole basis for treatment or other patient management decisions. A negative result may occur with  improper specimen collection/handling, submission of specimen  other than nasopharyngeal swab, presence of viral mutation(s) within the  areas targeted by this assay, and inadequate number of viral copies(<138 copies/mL). A negative result must be combined with clinical observations, patient history, and epidemiological information. The expected result is Negative.  Fact Sheet for Patients:  EntrepreneurPulse.com.au  Fact Sheet for Healthcare Providers:  IncredibleEmployment.be  This test is no t yet approved or cleared by the [place] FDA and  has been authorized for detection and/or diagnosis of SARS-CoV-2 by FDA under an Emergency Use Authorization (EUA). This EUA will remain  in effect (meaning this test can be used) for the duration of the COVID-19 declaration under Section 564(b)(1) of the Act, 21 U.S.C.section 360bbb-3(b)(1), unless the authorization is terminated  or revoked sooner.       Influenza A by PCR NEGATIVE NEGATIVE Final   Influenza B by PCR NEGATIVE NEGATIVE Final    Comment: (NOTE) The Xpert Xpress SARS-CoV-2/FLU/RSV plus assay is intended as an aid in the diagnosis of influenza from Nasopharyngeal swab specimens and should not be used as a sole basis for treatment. Nasal washings and aspirates are unacceptable for Xpert Xpress SARS-CoV-2/FLU/RSV testing.  Fact Sheet for Patients: EntrepreneurPulse.com.au  Fact Sheet for Healthcare Providers: IncredibleEmployment.be  This test is not yet approved or cleared by the [place] FDA and has been authorized for detection and/or diagnosis of SARS-CoV-2 by FDA under an Emergency Use Authorization (EUA). This EUA will remain in effect (meaning this test can be used) for the duration of the COVID-19 declaration under Section 564(b)(1) of the Act, 21 U.S.C. section 360bbb-3(b)(1), unless the authorization is terminated or revoked.  Performed at [facility], [address] [place], [place] [address]   Blood Culture ID Panel (Reflexed)     Status: Abnormal   Collection Time: 07/07/21  6:05 AM  Result Value Ref Range Status   Enterococcus faecalis NOT DETECTED NOT DETECTED Final   Enterococcus Faecium NOT DETECTED NOT DETECTED Final   Listeria monocytogenes NOT DETECTED NOT DETECTED Final   Staphylococcus species DETECTED (A) NOT DETECTED Final    Comment: CRITICAL RESULT CALLED TO, READ BACK BY AND VERIFIED WITH: [place] RN '@1007'$  07/08/21 EB    Staphylococcus aureus (BCID) NOT DETECTED NOT DETECTED Final   Staphylococcus epidermidis NOT DETECTED NOT DETECTED Final   Staphylococcus lugdunensis NOT DETECTED NOT DETECTED Final   Streptococcus species NOT DETECTED NOT DETECTED Final   Streptococcus agalactiae NOT DETECTED NOT DETECTED Final   Streptococcus pneumoniae NOT DETECTED NOT DETECTED Final   Streptococcus pyogenes NOT DETECTED NOT DETECTED Final   A.calcoaceticus-baumannii NOT DETECTED NOT DETECTED Final   Bacteroides fragilis NOT DETECTED NOT DETECTED Final   Enterobacterales NOT DETECTED NOT DETECTED Final   Enterobacter cloacae complex NOT DETECTED NOT DETECTED Final   Escherichia coli NOT DETECTED NOT DETECTED Final   Klebsiella aerogenes NOT DETECTED NOT DETECTED Final   Klebsiella oxytoca NOT DETECTED NOT DETECTED Final   Klebsiella pneumoniae NOT DETECTED NOT DETECTED Final   Proteus species NOT DETECTED NOT DETECTED Final   Salmonella species NOT DETECTED NOT DETECTED Final   Serratia marcescens NOT DETECTED NOT DETECTED Final   Haemophilus influenzae NOT DETECTED NOT DETECTED Final   Neisseria meningitidis NOT DETECTED NOT DETECTED Final   Pseudomonas aeruginosa NOT DETECTED NOT DETECTED Final   Stenotrophomonas maltophilia NOT DETECTED NOT DETECTED Final   Candida albicans NOT DETECTED NOT DETECTED Final   Candida auris NOT DETECTED NOT DETECTED Final   Candida glabrata NOT DETECTED NOT DETECTED Final   Candida krusei NOT  DETECTED NOT DETECTED  Final   Candida parapsilosis NOT DETECTED NOT DETECTED Final   Candida tropicalis NOT DETECTED NOT DETECTED Final   Cryptococcus neoformans/gattii NOT DETECTED NOT DETECTED Final    Comment: Performed at [facility], [address], [place], [place] [address]  MRSA Next Gen by PCR, Nasal     Status: None   Collection Time: 07/08/21  3:27 PM   Specimen: Nasal Mucosa; Nasal Swab  Result Value Ref Range Status   MRSA by PCR Next Gen NOT DETECTED NOT DETECTED Final    Comment: (NOTE) The GeneXpert MRSA Assay (FDA approved for NASAL specimens only), is one component of a comprehensive MRSA colonization surveillance program. It is not intended to diagnose MRSA infection nor to guide or monitor treatment for MRSA infections. Test performance is not FDA approved in patients less than 90 years old. Performed at [facility], [place] [address], [place], [place] [address]   Urine Culture     Status: None   Collection Time: 07/09/21  6:48 AM   Specimen: Urine, Clean Catch  Result Value Ref Range Status   Specimen Description   Final    URINE, CLEAN CATCH Performed at [facility], [address], [place], [place] [address]    Special Requests   Final    NONE Performed at [facility], [address], [place], [place] [address]    Culture   Final    NO GROWTH Performed at [facility], [place] [address], [place], [place] [address]    Report Status 07/10/2021 FINAL  Final  Blood culture (routine x 2)     Status: None (Preliminary result)   Collection Time: 07/09/21  7:08 AM   Specimen: BLOOD  Result Value Ref Range Status   Specimen Description   Final    BLOOD BLOOD RIGHT FOREARM Performed at [facility], [address] [place], [place] [address]    Special Requests   Final    BOTTLES DRAWN AEROBIC AND ANAEROBIC Blood Culture adequate volume Performed at [facility], [address], [place], [place] [address]    Culture   Final    NO GROWTH 3 DAYS Performed at [facility], [place] [address] [place], [place] [address]    Report Status PENDING  Incomplete  Resp Panel by RT-PCR (Flu A&B, Covid) Nasopharyngeal Swab     Status: None   Collection Time: 07/09/21  7:29 AM   Specimen: Nasopharyngeal Swab; Nasopharyngeal(NP) swabs in vial transport medium  Result Value Ref Range Status   SARS Coronavirus 2 by RT PCR NEGATIVE NEGATIVE Final    Comment: (NOTE) SARS-CoV-2 target nucleic acids are NOT DETECTED.  The SARS-CoV-2 RNA is generally detectable in upper respiratory specimens during the acute phase of infection. The lowest concentration of SARS-CoV-2 viral copies this assay can detect is 138 copies/mL. A negative result does not preclude SARS-Cov-2 infection and should not be used as the sole basis for treatment or other patient management decisions. A negative result may occur with  improper specimen collection/handling, submission of specimen other than nasopharyngeal swab, presence of viral mutation(s) within the areas targeted by this assay, and inadequate number of viral copies(<138 copies/mL). A negative result must be combined with clinical observations, patient history, and epidemiological information. The expected result is Negative.  Fact Sheet for Patients:  EntrepreneurPulse.com.au  Fact Sheet for Healthcare Providers:  IncredibleEmployment.be  This test is no t yet approved or cleared by the [place]  FDA and  has been authorized for detection and/or diagnosis of SARS-CoV-2 by FDA under an Emergency Use Authorization (EUA). This EUA will remain  in effect (meaning this test can be used) for the duration of the COVID-19 declaration under Section 564(b)(1) of the Act, 21 U.S.C.section 360bbb-3(b)(1), unless the authorization is terminated  or revoked sooner.        Influenza A by PCR NEGATIVE NEGATIVE Final   Influenza B by PCR NEGATIVE NEGATIVE Final    Comment: (NOTE) The Xpert Xpress SARS-CoV-2/FLU/RSV plus assay is intended as an aid in the diagnosis of influenza from Nasopharyngeal swab specimens and should not be used as a sole basis for treatment. Nasal washings and aspirates are unacceptable for Xpert Xpress SARS-CoV-2/FLU/RSV testing.  Fact Sheet for Patients: EntrepreneurPulse.com.au  Fact Sheet for Healthcare Providers: IncredibleEmployment.be  This test is not yet approved or cleared by the [place] FDA and has been authorized for detection and/or diagnosis of SARS-CoV-2 by FDA under an Emergency Use Authorization (EUA). This EUA will remain in effect (meaning this test can be used) for the duration of the COVID-19 declaration under Section 564(b)(1) of the Act, 21 U.S.C. section 360bbb-3(b)(1), unless the authorization is terminated or revoked.  Performed at [facility], [address] [place], [place] [address]   Blood culture (routine x 2)     Status: None (Preliminary result)   Collection Time: 07/09/21  7:30 AM   Specimen: BLOOD LEFT HAND  Result Value Ref Range Status   Specimen Description   Final    BLOOD LEFT HAND Performed at [facility], [address], [place], [place] [address]    Special Requests   Final    Blood Culture adequate volume Performed at [facility], [address], [place], [place] [address]    Culture   Final    NO GROWTH 3 DAYS Performed at [facility], [place] [address] [place], [place] [address]    Report Status PENDING  Incomplete  Culture, blood (Routine X 2) w Reflex to ID Panel     Status: None (Preliminary result)   Collection Time: 07/11/21  9:15 AM   Specimen: BLOOD  Result Value Ref Range Status   Specimen Description   Final    BLOOD RIGHT ANTECUBITAL Performed at [facility] [address], [place], [place] [address]    Special Requests   Final    BOTTLES DRAWN AEROBIC ONLY Blood Culture results may not be optimal due to an inadequate volume of blood received in culture bottles Performed at [facility] [address] [place], [place] [address]    Culture   Final    NO GROWTH < 24 HOURS Performed at [facility] [address], [place], [place] [address]    Report Status PENDING  Incomplete  Culture, blood (Routine X 2) w Reflex to ID Panel     Status: None (Preliminary result)   Collection Time: 07/11/21  9:15 AM   Specimen: BLOOD  Result Value Ref Range Status   Specimen Description   Final    BLOOD BLOOD LEFT HAND Performed at [facility] [address] [place], [place] [address]    Special Requests   Final    BOTTLES DRAWN AEROBIC AND ANAEROBIC Blood Culture adequate volume Performed at [facility] [address], [place],  [address]    Culture   Final    NO GROWTH <  24 HOURS Performed at [facility], [place] [address] [place], [place] [address]    Report Status PENDING  Incomplete    Studies/Results: CT CHEST W CONTRAST  Result Date: 07/10/2021 CLINICAL DATA:  Fever of unknown origin. History of chronic lymphocytic leukemia. EXAM: CT CHEST, ABDOMEN, AND PELVIS WITH CONTRAST TECHNIQUE: Multidetector CT imaging of the chest, abdomen and pelvis was performed following the standard protocol during bolus administration of intravenous contrast. CONTRAST:  81m OMNIPAQUE IOHEXOL 350 MG/ML SOLN COMPARISON:  October 18, 2020. FINDINGS: CT CHEST FINDINGS Cardiovascular: Status post aortic valve repair. Atherosclerosis of thoracic aorta is noted without aneurysm or dissection. Normal cardiac size. No pericardial effusion. Mediastinum/Nodes: Thyroid gland is unremarkable. The esophagus is unremarkable. 1.9 cm precarinal lymph node is noted which is  significantly enlarged compared to prior exam. 8 mm aortopulmonary window node is noted which is enlarged compared to prior exam. 13 mm right paratracheal lymph node is noted which is enlarged compared to prior exam. 10 mm right supraclavicular lymph node is noted which is enlarged compared to prior exam. Lungs/Pleura: No pneumothorax or pleural effusion is noted. Stable scarring is noted in right lower lobe. Increased peribronchial opacity is noted in superior segment of right lower lobe concerning for focal inflammation. Musculoskeletal: No chest wall mass or suspicious bone lesions identified. CT ABDOMEN PELVIS FINDINGS Hepatobiliary: No focal liver abnormality is seen. No gallstones, gallbladder wall thickening, or biliary dilatation. Pancreas: Unremarkable. No pancreatic ductal dilatation or surrounding inflammatory changes. Spleen: Normal in size without focal abnormality. Adrenals/Urinary Tract: Adrenal glands appear normal. Bilateral renal cysts are noted. No hydronephrosis or renal obstruction is noted. Urinary bladder is unremarkable. Stomach/Bowel: Stomach is within normal limits. Appendix appears normal. No evidence of bowel wall thickening, distention, or inflammatory changes. Vascular/Lymphatic: Aortic atherosclerosis. 15 x 11 mm lymph node is seen to the left of the superior mesenteric trunk which is slightly smaller compared to prior exam. 7 mm lymph node is noted anterior to the abdominal aorta which is unchanged compared to prior exam 9 mm right periaortic lymph node is noted which is enlarged compared to prior exam. 1 cm left periaortic lymph node is noted just inferior to the splenic vein, which is enlarged compared to prior. Reproductive: Status post prostatectomy. Other: No abdominal wall hernia or abnormality. No abdominopelvic ascites. Musculoskeletal: No acute or significant osseous findings. IMPRESSION: Increased opacity is seen involving the superior segment of right lower lobe concerning  for focal pneumonia or other inflammation. Mildly enlarged mediastinal, supraclavicular and periaortic adenopathy is noted concerning for malignancy given the patient's history of chronic lymphocytic leukemia. Aortic Atherosclerosis (ICD10-I70.0). Electronically Signed   By: JMarijo ConceptionM.D.   On: 07/10/2021 20:34   CT ABDOMEN PELVIS W CONTRAST  Result Date: 07/10/2021 CLINICAL DATA:  Fever of unknown origin. History of chronic lymphocytic leukemia. EXAM: CT CHEST, ABDOMEN, AND PELVIS WITH CONTRAST TECHNIQUE: Multidetector CT imaging of the chest, abdomen and pelvis was performed following the standard protocol during bolus administration of intravenous contrast. CONTRAST:  836mOMNIPAQUE IOHEXOL 350 MG/ML SOLN COMPARISON:  October 18, 2020. FINDINGS: CT CHEST FINDINGS Cardiovascular: Status post aortic valve repair. Atherosclerosis of thoracic aorta is noted without aneurysm or dissection. Normal cardiac size. No pericardial effusion. Mediastinum/Nodes: Thyroid gland is unremarkable. The esophagus is unremarkable. 1.9 cm precarinal lymph node is noted which is significantly enlarged compared to prior exam. 8 mm aortopulmonary window node is noted which is enlarged compared to prior exam. 13 mm right paratracheal lymph node  is noted which is enlarged compared to prior exam. 10 mm right supraclavicular lymph node is noted which is enlarged compared to prior exam. Lungs/Pleura: No pneumothorax or pleural effusion is noted. Stable scarring is noted in right lower lobe. Increased peribronchial opacity is noted in superior segment of right lower lobe concerning for focal inflammation. Musculoskeletal: No chest wall mass or suspicious bone lesions identified. CT ABDOMEN PELVIS FINDINGS Hepatobiliary: No focal liver abnormality is seen. No gallstones, gallbladder wall thickening, or biliary dilatation. Pancreas: Unremarkable. No pancreatic ductal dilatation or surrounding inflammatory changes. Spleen: Normal in  size without focal abnormality. Adrenals/Urinary Tract: Adrenal glands appear normal. Bilateral renal cysts are noted. No hydronephrosis or renal obstruction is noted. Urinary bladder is unremarkable. Stomach/Bowel: Stomach is within normal limits. Appendix appears normal. No evidence of bowel wall thickening, distention, or inflammatory changes. Vascular/Lymphatic: Aortic atherosclerosis. 15 x 11 mm lymph node is seen to the left of the superior mesenteric trunk which is slightly smaller compared to prior exam. 7 mm lymph node is noted anterior to the abdominal aorta which is unchanged compared to prior exam 9 mm right periaortic lymph node is noted which is enlarged compared to prior exam. 1 cm left periaortic lymph node is noted just inferior to the splenic vein, which is enlarged compared to prior. Reproductive: Status post prostatectomy. Other: No abdominal wall hernia or abnormality. No abdominopelvic ascites. Musculoskeletal: No acute or significant osseous findings. IMPRESSION: Increased opacity is seen involving the superior segment of right lower lobe concerning for focal pneumonia or other inflammation. Mildly enlarged mediastinal, supraclavicular and periaortic adenopathy is noted concerning for malignancy given the patient's history of chronic lymphocytic leukemia. Aortic Atherosclerosis (ICD10-I70.0). Electronically Signed   By: Marijo Conception M.D.   On: 07/10/2021 20:34      Assessment/Plan:  INTERVAL HISTORY: TEE tomorrow  Principal Problem:   Bacteremia Active Problems:   Essential hypertension   PROSTATE CANCER, HX OF   Chronic lymphocytic leukemia (HCC)   S/P Bioprosthetic AVR (aortic valve replacement) in 2011   CKD (chronic kidney disease), stage III (HCC)   History of pulmonary embolism   Neutropenic fever (HCC)   FUO (fever of unknown origin)   Pancytopenia (HCC)   Pneumonia of right lower lobe due to infectious organism    [name] is a 75 y.o. male with AVR,  CLL on chemotherapy with recurrent episodes of fever drenching sweats myalgias diffuse body aches in the context of neutropenia found to have Staph hominis and an isolated blood culture on August 27th.  He had been seen in the ER and prescribed cefdinir but was called back and came back to the hospital 28th.  He wished not to be admitted and was changed over to levofloxacin.  Unfortunately blood cultures were not done at that time.  He has been simply admitted to the hospital for ongoing fevers and drenching sweats.  Repeat blood culture taken on admission in the context of being on levofloxacin.  Staph hominis is subsequently worked up at her request which shows it to be methicillin-resistant but fluoroquinolone sensitive.  CT chest and pelvis performed showing possible right lower lobe pneumonia  #1 Staph hominis is of the blood culture:  See discussion from yesterday my inclination is to think this is a contaminant.  However given the fact that he has a bioprosthetic valve and that he has had recurrent fevers going back to July I think it is wise to make sure we have ruled out or if  he has not ruled in endocarditis   #2 right-sided pneumonia: Continue Augmentin to complete 5 days  #3 fevers: I think they are more likely related to his neutropenia rather than having a true bloodstream infection  #4 CLL on chemotherapy currently being held and followed currently closely by hematology oncology  I spent 37 minutes with the patient including greater than 50% of time in face to face counseling of the patient guarding the reasons to work-up his blood culture for endocarditis the differential for his fevers including neutropenic fevers possible pneumonia, reviewing his CT chest abdomen pelvis his updated blood culture CBC with differential metabolic panel along with review of medical records before and during the visit and in coordination of his care. e and during the visit and in coordination of his  care with Cardiology.   LOS: 3 days   Alcide Evener 07/12/2021, 1:52 PM

## 2021-07-12 NOTE — Progress Notes (Signed)
[facility]  PROGRESS NOTE  Kvaughn Lollis Colter [id number] DOB: 20-Dec-1945 DOA: 07/09/2021 PCP: [name], MD   Brief HPI:   75 year old male with history of CLL, pancytopenia, pulmonary embolus due to travel now on chronic warfarin therapy, history of aortic valve replacement in 2011 presented to ED on 8/29 with fever and positive blood culture. Patient's original issue was that he had a nosebleed with and chills with fever 101 at home on 07/07/21; he did present to the emergency department, had cultures drawn, and was started on oral antibiotics. He was told to come back to the emergency department 07/08/21 due to finding of a positive blood culture; he came to the emergency department and improved and was sent home on oral levofloxacin. Around 2 am on 07/09/21 he developed a cold sweat, chills, then burning; temperature was 103 at home. He took Tylenol at home. ED physician discussed the case with the pharmacist who recommended Ancef, which was started in the emergency department.   Subjective   Denies any complaints.  Awaiting to get TEE done.  TEE planned for tomorrow morning.1   Assessment/Plan:     Staph hominis bacteremia -1 set of blood cultures drawn on 8/27 revealed staph hominis -Repeat blood cultures drawn on 8/29 is currently pending -ID has been consulted; IV fluids that this could be contaminant or true positive and culture -TEE scheduled for 07-13-21  Right-sided pneumonia -Started on Augmentin for 5-day course  Acute kidney injury on CKD stage III -Patient baseline creatinine 1.5 -Creatinine back to baseline  CLL/pancytopenia -Currently on therapy with Venetoclax -Followed by [name] -Received Neupogen yesterday  Hypertension -Continue HCTZ  Depression/anxiety -Continue Zoloft, Ativan as needed  Hyperlipidemia -Continue Crestor    Scheduled medications:    amoxicillin-clavulanate  1 tablet Oral Q12H   cholecalciferol  2,000 Units Oral q  morning   enoxaparin (LOVENOX) injection  1 mg/kg Subcutaneous Q12H   [START ON 07/13/2021] enoxaparin (LOVENOX) injection  1 mg/kg Subcutaneous Q12H   hydrochlorothiazide  12.5 mg Oral Daily   meclizine  25 mg Oral q morning   omeprazole  40 mg Oral QPM   rosuvastatin  10 mg Oral QHS   sertraline  100 mg Oral q morning         Data Reviewed:   CBG:  No results for input(s): GLUCAP in the last 168 hours.  SpO2: 99 %    Vitals:   07/11/21 2045 07/11/21 2318 07/12/21 0337 07/12/21 1332  BP: 121/72 118/66 110/72 117/73  Pulse: 65 70 73 64  Resp: '18 18 18   '$ Temp: 98.6 F (37 C) 99.2 F (37.3 C) 98.8 F (37.1 C) 98.5 F (36.9 C)  TempSrc: Oral Oral Oral Oral  SpO2: 99% 96% 93% 99%  Weight:      Height:         Intake/Output Summary (Last 24 hours) at 07/12/2021 1822 Last data filed at 07/12/2021 0900 Gross per 24 hour  Intake 120 ml  Output --  Net 120 ml    08/30 1901 - 09/01 0700 In: 480 [P.O.:480] Out: 300 [Urine:300]  Filed Weights   07/09/21 0639 07/11/21 0405  Weight: 87.5 kg 89.7 kg    CBC:  Recent Labs  Lab 07/07/21 0605 07/09/21 0708 07/10/21 0521 07/11/21 0529 07/12/21 0815  WBC 2.6* 1.9* 1.8* 6.8 5.2  HGB 11.5* 11.1* 11.0* 10.9* 10.4*  HCT 34.1* 33.7* 34.0* 32.7* 30.9*  PLT 102* 76* 77* 69* 68*  MCV 95.8 96.0 99.1 96.5  95.7  MCH 32.3 31.6 32.1 32.2 32.2  MCHC 33.7 32.9 32.4 33.3 33.7  RDW 13.4 13.5 13.7 13.5 13.5  LYMPHSABS 0.6* 0.3*  --  0.3* 0.5*  MONOABS 0.7 0.5  --  0.4 0.3  EOSABS 0.0 0.0  --  0.0 0.0  BASOSABS 0.0 0.0  --  0.0 0.0    Complete metabolic panel:  Recent Labs  Lab 07/07/21 0605 07/09/21 0708 07/09/21 0900 07/10/21 0521 07/10/21 1100 07/11/21 0529 07/12/21 0540  NA 135 134*  --  135  --  139 134*  K 3.9 3.7  --  3.6  --  3.8 3.6  CL 102 100  --  102  --  103 102  CO2 25 25  --  25  --  28 25  GLUCOSE 99 114*  --  105*  --  103* 98  BUN 22 28*  --  22  --  21 17  CREATININE 1.53* 1.99*  --  1.47*  --   1.49* 1.26*  CALCIUM 8.7* 8.8*  --  8.8*  --  9.1 8.8*  AST 22  --   --   --   --   --   --   ALT 16  --   --   --   --   --   --   ALKPHOS 55  --   --   --   --   --   --   BILITOT 0.6  --   --   --   --   --   --   ALBUMIN 3.7  --   --   --   --   --   --   CRP  --   --   --   --   --  15.8*  --   PROCALCITON <0.10 0.15  --  0.15  --  0.15  --   LATICACIDVEN 0.5  --  0.8  --   --   --   --   INR 2.8* 2.8*  --   --  2.3* 1.9*  --     No results for input(s): LIPASE, AMYLASE in the last 168 hours.  Recent Labs  Lab 07/07/21 0605 07/09/21 0708 07/09/21 0729 07/10/21 0521 07/11/21 0529  CRP  --   --   --   --  15.8*  PROCALCITON <0.10 0.15  --  0.15 0.15  SARSCOV2NAA NEGATIVE  --  NEGATIVE  --   --     ------------------------------------------------------------------------------------------------------------------ No results for input(s): CHOL, HDL, LDLCALC, TRIG, CHOLHDL, LDLDIRECT in the last 72 hours.  Lab Results  Component Value Date   HGBA1C 5.7 11/29/2015   ------------------------------------------------------------------------------------------------------------------ No results for input(s): TSH, T4TOTAL, T3FREE, THYROIDAB in the last 72 hours.  Invalid input(s): FREET3 ------------------------------------------------------------------------------------------------------------------ Recent Labs    07/11/21 0529  FERRITIN 365*    Coagulation profile Recent Labs  Lab 07/07/21 0605 07/09/21 0708 07/10/21 1100 07/11/21 0529  INR 2.8* 2.8* 2.3* 1.9*   No results for input(s): DDIMER in the last 72 hours.  Cardiac Enzymes Recent Labs  Lab 07/11/21 0529  CKTOTAL 73    ------------------------------------------------------------------------------------------------------------------ No results found for: BNP   Antibiotics: Anti-infectives (From admission, onward)    Start     Dose/Rate Route Frequency Ordered Stop   07/11/21 1200   amoxicillin-clavulanate (AUGMENTIN) 875-125 MG per tablet 1 tablet        1 tablet Oral Every 12 hours 07/11/21 1105 07/16/21 0959  07/09/21 1800  vancomycin (VANCOREADY) IVPB 1750 mg/350 mL  Status:  Discontinued        1,750 mg 175 mL/hr over 120 Minutes Intravenous  Once 07/09/21 1704 07/09/21 1728   07/09/21 0715  ceFAZolin (ANCEF) IVPB 2g/100 mL premix  Status:  Discontinued        2 g 200 mL/hr over 30 Minutes Intravenous Every 8 hours 07/09/21 0708 07/10/21 1229        Radiology Reports  CT CHEST W CONTRAST  Result Date: 07/10/2021 CLINICAL DATA:  Fever of unknown origin. History of chronic lymphocytic leukemia. EXAM: CT CHEST, ABDOMEN, AND PELVIS WITH CONTRAST TECHNIQUE: Multidetector CT imaging of the chest, abdomen and pelvis was performed following the standard protocol during bolus administration of intravenous contrast. CONTRAST:  76m OMNIPAQUE IOHEXOL 350 MG/ML SOLN COMPARISON:  October 18, 2020. FINDINGS: CT CHEST FINDINGS Cardiovascular: Status post aortic valve repair. Atherosclerosis of thoracic aorta is noted without aneurysm or dissection. Normal cardiac size. No pericardial effusion. Mediastinum/Nodes: Thyroid gland is unremarkable. The esophagus is unremarkable. 1.9 cm precarinal lymph node is noted which is significantly enlarged compared to prior exam. 8 mm aortopulmonary window node is noted which is enlarged compared to prior exam. 13 mm right paratracheal lymph node is noted which is enlarged compared to prior exam. 10 mm right supraclavicular lymph node is noted which is enlarged compared to prior exam. Lungs/Pleura: No pneumothorax or pleural effusion is noted. Stable scarring is noted in right lower lobe. Increased peribronchial opacity is noted in superior segment of right lower lobe concerning for focal inflammation. Musculoskeletal: No chest wall mass or suspicious bone lesions identified. CT ABDOMEN PELVIS FINDINGS Hepatobiliary: No focal liver abnormality is  seen. No gallstones, gallbladder wall thickening, or biliary dilatation. Pancreas: Unremarkable. No pancreatic ductal dilatation or surrounding inflammatory changes. Spleen: Normal in size without focal abnormality. Adrenals/Urinary Tract: Adrenal glands appear normal. Bilateral renal cysts are noted. No hydronephrosis or renal obstruction is noted. Urinary bladder is unremarkable. Stomach/Bowel: Stomach is within normal limits. Appendix appears normal. No evidence of bowel wall thickening, distention, or inflammatory changes. Vascular/Lymphatic: Aortic atherosclerosis. 15 x 11 mm lymph node is seen to the left of the superior mesenteric trunk which is slightly smaller compared to prior exam. 7 mm lymph node is noted anterior to the abdominal aorta which is unchanged compared to prior exam 9 mm right periaortic lymph node is noted which is enlarged compared to prior exam. 1 cm left periaortic lymph node is noted just inferior to the splenic vein, which is enlarged compared to prior. Reproductive: Status post prostatectomy. Other: No abdominal wall hernia or abnormality. No abdominopelvic ascites. Musculoskeletal: No acute or significant osseous findings. IMPRESSION: Increased opacity is seen involving the superior segment of right lower lobe concerning for focal pneumonia or other inflammation. Mildly enlarged mediastinal, supraclavicular and periaortic adenopathy is noted concerning for malignancy given the patient's history of chronic lymphocytic leukemia. Aortic Atherosclerosis (ICD10-I70.0). Electronically Signed   By: JMarijo ConceptionM.D.   On: 07/10/2021 20:34   CT ABDOMEN PELVIS W CONTRAST  Result Date: 07/10/2021 CLINICAL DATA:  Fever of unknown origin. History of chronic lymphocytic leukemia. EXAM: CT CHEST, ABDOMEN, AND PELVIS WITH CONTRAST TECHNIQUE: Multidetector CT imaging of the chest, abdomen and pelvis was performed following the standard protocol during bolus administration of intravenous  contrast. CONTRAST:  863mOMNIPAQUE IOHEXOL 350 MG/ML SOLN COMPARISON:  October 18, 2020. FINDINGS: CT CHEST FINDINGS Cardiovascular: Status post aortic valve repair. Atherosclerosis of thoracic aorta is noted  without aneurysm or dissection. Normal cardiac size. No pericardial effusion. Mediastinum/Nodes: Thyroid gland is unremarkable. The esophagus is unremarkable. 1.9 cm precarinal lymph node is noted which is significantly enlarged compared to prior exam. 8 mm aortopulmonary window node is noted which is enlarged compared to prior exam. 13 mm right paratracheal lymph node is noted which is enlarged compared to prior exam. 10 mm right supraclavicular lymph node is noted which is enlarged compared to prior exam. Lungs/Pleura: No pneumothorax or pleural effusion is noted. Stable scarring is noted in right lower lobe. Increased peribronchial opacity is noted in superior segment of right lower lobe concerning for focal inflammation. Musculoskeletal: No chest wall mass or suspicious bone lesions identified. CT ABDOMEN PELVIS FINDINGS Hepatobiliary: No focal liver abnormality is seen. No gallstones, gallbladder wall thickening, or biliary dilatation. Pancreas: Unremarkable. No pancreatic ductal dilatation or surrounding inflammatory changes. Spleen: Normal in size without focal abnormality. Adrenals/Urinary Tract: Adrenal glands appear normal. Bilateral renal cysts are noted. No hydronephrosis or renal obstruction is noted. Urinary bladder is unremarkable. Stomach/Bowel: Stomach is within normal limits. Appendix appears normal. No evidence of bowel wall thickening, distention, or inflammatory changes. Vascular/Lymphatic: Aortic atherosclerosis. 15 x 11 mm lymph node is seen to the left of the superior mesenteric trunk which is slightly smaller compared to prior exam. 7 mm lymph node is noted anterior to the abdominal aorta which is unchanged compared to prior exam 9 mm right periaortic lymph node is noted which is  enlarged compared to prior exam. 1 cm left periaortic lymph node is noted just inferior to the splenic vein, which is enlarged compared to prior. Reproductive: Status post prostatectomy. Other: No abdominal wall hernia or abnormality. No abdominopelvic ascites. Musculoskeletal: No acute or significant osseous findings. IMPRESSION: Increased opacity is seen involving the superior segment of right lower lobe concerning for focal pneumonia or other inflammation. Mildly enlarged mediastinal, supraclavicular and periaortic adenopathy is noted concerning for malignancy given the patient's history of chronic lymphocytic leukemia. Aortic Atherosclerosis (ICD10-I70.0). Electronically Signed   By: Marijo Conception M.D.   On: 07/10/2021 20:34      DVT prophylaxis: Coumadin  Code Status: Full code  Family Communication: No family at bedside   Consultants:   Procedures:     Objective    Physical Examination:  General-appears in no acute distress Heart-S1-S2, regular, no murmur auscultated Lungs-clear to auscultation bilaterally, no wheezing or crackles auscultated Abdomen-soft, nontender, no organomegaly Extremities-no edema in the lower extremities Neuro-alert, oriented x3, no focal deficit noted  Status is: Inpatient  Dispo: The patient is from: Home              Anticipated d/c is to: Home              Anticipated d/c date is: 07/15/2021              Patient currently not stable for discharge  Barrier to discharge-ongoing evaluation for bacteremia retrocaval  COVID-19 Labs  Recent Labs    07/11/21 0529  FERRITIN 365*  LDH 310*  CRP 15.8*    Lab Results  Component Value Date   [place] NEGATIVE 07/09/2021   [place] NEGATIVE 07/07/2021   [place] NEGATIVE 06/18/2021   [place] NEGATIVE 05/29/2021    Microbiology  Recent Results (from the past 240 hour(s))  Blood Culture (routine x 2)     Status: Abnormal   Collection Time: 07/07/21  6:05 AM   Specimen:  BLOOD  Result Value Ref Range Status   Specimen Description  Final    BLOOD BLOOD RIGHT FOREARM Performed at [facility], [address], [place], [place] [address]    Special Requests   Final    BOTTLES DRAWN AEROBIC AND ANAEROBIC Blood Culture adequate volume Performed at [facility], [address] [place], [place] [address]    Culture  Setup Time   Final    GRAM POSITIVE COCCI IN CLUSTERS ANAEROBIC BOTTLE ONLY CRITICAL RESULT CALLED TO, READ BACK BY AND VERIFIED WITH: Mickie Hillier RN '@1007'$  07/08/21 EB Performed at [facility], [place] [address], [place], [place] [address]    Culture STAPHYLOCOCCUS HOMINIS (A)  Final   Report Status 07/11/2021 FINAL  Final   Organism ID, Bacteria STAPHYLOCOCCUS HOMINIS  Final      Susceptibility   Staphylococcus hominis - MIC*    CIPROFLOXACIN <=0.5 SENSITIVE Sensitive     ERYTHROMYCIN <=0.25 SENSITIVE Sensitive     GENTAMICIN <=0.5 SENSITIVE Sensitive     OXACILLIN RESISTANT Resistant     TETRACYCLINE <=1 SENSITIVE Sensitive     VANCOMYCIN <=0.5 SENSITIVE Sensitive     TRIMETH/SULFA 20 SENSITIVE Sensitive     CLINDAMYCIN <=0.25 SENSITIVE Sensitive     RIFAMPIN <=0.5 SENSITIVE Sensitive     Inducible Clindamycin NEGATIVE Sensitive     * STAPHYLOCOCCUS HOMINIS  Resp Panel by RT-PCR (Flu A&B, Covid) Peripheral     Status: None   Collection Time: 07/07/21  6:05 AM   Specimen: Peripheral; Nasopharyngeal(NP) swabs in vial transport medium  Result Value Ref Range Status   SARS Coronavirus 2 by RT PCR NEGATIVE NEGATIVE Final    Comment: (NOTE) SARS-CoV-2 target nucleic acids are NOT DETECTED.  The SARS-CoV-2 RNA is generally detectable in upper respiratory specimens during the acute phase of infection. The lowest concentration of SARS-CoV-2 viral copies this assay can detect is 138 copies/mL. A negative result does not preclude SARS-Cov-2 infection and should not be used as the sole basis for  treatment or other patient management decisions. A negative result may occur with  improper specimen collection/handling, submission of specimen other than nasopharyngeal swab, presence of viral mutation(s) within the areas targeted by this assay, and inadequate number of viral copies(<138 copies/mL). A negative result must be combined with clinical observations, patient history, and epidemiological information. The expected result is Negative.  Fact Sheet for Patients:  EntrepreneurPulse.com.au  Fact Sheet for Healthcare Providers:  IncredibleEmployment.be  This test is no t yet approved or cleared by the [place] FDA and  has been authorized for detection and/or diagnosis of SARS-CoV-2 by FDA under an Emergency Use Authorization (EUA). This EUA will remain  in effect (meaning this test can be used) for the duration of the COVID-19 declaration under Section 564(b)(1) of the Act, 21 U.S.C.section 360bbb-3(b)(1), unless the authorization is terminated  or revoked sooner.       Influenza A by PCR NEGATIVE NEGATIVE Final   Influenza B by PCR NEGATIVE NEGATIVE Final    Comment: (NOTE) The Xpert Xpress SARS-CoV-2/FLU/RSV plus assay is intended as an aid in the diagnosis of influenza from Nasopharyngeal swab specimens and should not be used as a sole basis for treatment. Nasal washings and aspirates are unacceptable for Xpert Xpress SARS-CoV-2/FLU/RSV testing.  Fact Sheet for Patients: EntrepreneurPulse.com.au  Fact Sheet for Healthcare Providers: IncredibleEmployment.be  This test is not yet approved or cleared by the [place] FDA and has been authorized for detection and/or diagnosis of SARS-CoV-2 by FDA under an Emergency Use Authorization (EUA). This EUA  will remain in effect (meaning this test can be used) for the duration of the COVID-19 declaration under Section 564(b)(1) of the Act, 21  U.S.C. section 360bbb-3(b)(1), unless the authorization is terminated or revoked.  Performed at [facility], [address], [place], [place] [address]   Blood Culture ID Panel (Reflexed)     Status: Abnormal   Collection Time: 07/07/21  6:05 AM  Result Value Ref Range Status   Enterococcus faecalis NOT DETECTED NOT DETECTED Final   Enterococcus Faecium NOT DETECTED NOT DETECTED Final   Listeria monocytogenes NOT DETECTED NOT DETECTED Final   Staphylococcus species DETECTED (A) NOT DETECTED Final    Comment: CRITICAL RESULT CALLED TO, READ BACK BY AND VERIFIED WITH: [place] RN '@1007'$  07/08/21 EB    Staphylococcus aureus (BCID) NOT DETECTED NOT DETECTED Final   Staphylococcus epidermidis NOT DETECTED NOT DETECTED Final   Staphylococcus lugdunensis NOT DETECTED NOT DETECTED Final   Streptococcus species NOT DETECTED NOT DETECTED Final   Streptococcus agalactiae NOT DETECTED NOT DETECTED Final   Streptococcus pneumoniae NOT DETECTED NOT DETECTED Final   Streptococcus pyogenes NOT DETECTED NOT DETECTED Final   A.calcoaceticus-baumannii NOT DETECTED NOT DETECTED Final   Bacteroides fragilis NOT DETECTED NOT DETECTED Final   Enterobacterales NOT DETECTED NOT DETECTED Final   Enterobacter cloacae complex NOT DETECTED NOT DETECTED Final   Escherichia coli NOT DETECTED NOT DETECTED Final   Klebsiella aerogenes NOT DETECTED NOT DETECTED Final   Klebsiella oxytoca NOT DETECTED NOT DETECTED Final   Klebsiella pneumoniae NOT DETECTED NOT DETECTED Final   Proteus species NOT DETECTED NOT DETECTED Final   Salmonella species NOT DETECTED NOT DETECTED Final   Serratia marcescens NOT DETECTED NOT DETECTED Final   Haemophilus influenzae NOT DETECTED NOT DETECTED Final   Neisseria meningitidis NOT DETECTED NOT DETECTED Final   Pseudomonas aeruginosa NOT DETECTED NOT DETECTED Final   Stenotrophomonas maltophilia NOT DETECTED NOT DETECTED Final   Candida albicans NOT DETECTED  NOT DETECTED Final   Candida auris NOT DETECTED NOT DETECTED Final   Candida glabrata NOT DETECTED NOT DETECTED Final   Candida krusei NOT DETECTED NOT DETECTED Final   Candida parapsilosis NOT DETECTED NOT DETECTED Final   Candida tropicalis NOT DETECTED NOT DETECTED Final   Cryptococcus neoformans/gattii NOT DETECTED NOT DETECTED Final    Comment: Performed at [facility], [address], [place], [place] [address]  MRSA Next Gen by PCR, Nasal     Status: None   Collection Time: 07/08/21  3:27 PM   Specimen: Nasal Mucosa; Nasal Swab  Result Value Ref Range Status   MRSA by PCR Next Gen NOT DETECTED NOT DETECTED Final    Comment: (NOTE) The GeneXpert MRSA Assay (FDA approved for NASAL specimens only), is one component of a comprehensive MRSA colonization surveillance program. It is not intended to diagnose MRSA infection nor to guide or monitor treatment for MRSA infections. Test performance is not FDA approved in patients less than 19 years old. Performed at [facility], [place] [address], [place], [place] [address]   Urine Culture     Status: None   Collection Time: 07/09/21  6:48 AM   Specimen: Urine, Clean Catch  Result Value Ref Range Status   Specimen Description   Final    URINE, CLEAN CATCH Performed at [facility], [address], [place], [place] [address]    Special Requests   Final    NONE Performed at [facility], [place], [place], [place] [address]    Culture   Final    NO GROWTH Performed at [facility], [place] [address], [place], [place] [address]    Report Status 07/10/2021 FINAL  Final  Blood culture (routine x 2)     Status: None (Preliminary result)   Collection Time: 07/09/21  7:08 AM   Specimen: BLOOD  Result Value Ref Range Status   Specimen Description   Final    BLOOD BLOOD RIGHT FOREARM Performed at [facility], [address] [place], [place]  [address]    Special Requests   Final    BOTTLES DRAWN AEROBIC AND ANAEROBIC Blood Culture adequate volume Performed at [facility], [address], [place], [place] [address]    Culture   Final    NO GROWTH 3 DAYS Performed at [facility], [place] [address], [place], [place] [address]    Report Status PENDING  Incomplete  Resp Panel by RT-PCR (Flu A&B, Covid) Nasopharyngeal Swab     Status: None   Collection Time: 07/09/21  7:29 AM   Specimen: Nasopharyngeal Swab; Nasopharyngeal(NP) swabs in vial transport medium  Result Value Ref Range Status   SARS Coronavirus 2 by RT PCR NEGATIVE NEGATIVE Final    Comment: (NOTE) SARS-CoV-2 target nucleic acids are NOT DETECTED.  The SARS-CoV-2 RNA is generally detectable in upper respiratory specimens during the acute phase of infection. The lowest concentration of SARS-CoV-2 viral copies this assay can detect is 138 copies/mL. A negative result does not preclude SARS-Cov-2 infection and should not be used as the sole basis for treatment or other patient management decisions. A negative result may occur with  improper specimen collection/handling, submission of specimen other than nasopharyngeal swab, presence of viral mutation(s) within the areas targeted by this assay, and inadequate number of viral copies(<138 copies/mL). A negative result must be combined with clinical observations, patient history, and epidemiological information. The expected result is Negative.  Fact Sheet for Patients:  EntrepreneurPulse.com.au  Fact Sheet for Healthcare Providers:  IncredibleEmployment.be  This test is no t yet approved or cleared by the [place] FDA and  has been authorized for detection and/or diagnosis of SARS-CoV-2 by FDA under an Emergency Use Authorization (EUA). This EUA will remain  in effect (meaning this test can be used) for the duration of the COVID-19 declaration under  Section 564(b)(1) of the Act, 21 U.S.C.section 360bbb-3(b)(1), unless the authorization is terminated  or revoked sooner.       Influenza A by PCR NEGATIVE NEGATIVE Final   Influenza B by PCR NEGATIVE NEGATIVE Final    Comment: (NOTE) The Xpert Xpress SARS-CoV-2/FLU/RSV plus assay is intended as an aid in the diagnosis of influenza from Nasopharyngeal swab specimens and should not be used as a sole basis for treatment. Nasal washings and aspirates are unacceptable for Xpert Xpress SARS-CoV-2/FLU/RSV testing.  Fact Sheet for Patients: EntrepreneurPulse.com.au  Fact Sheet for Healthcare Providers: IncredibleEmployment.be  This test is not yet approved or cleared by the [place] FDA and has been authorized for detection and/or diagnosis of SARS-CoV-2 by FDA under an Emergency Use Authorization (EUA). This EUA will remain in effect (meaning this test can be used) for the duration of the COVID-19 declaration under Section 564(b)(1) of the Act, 21 U.S.C. section 360bbb-3(b)(1), unless the authorization is terminated or revoked.  Performed at [facility], [address] [place], [place] [address]   Blood culture (routine x 2)     Status:  None (Preliminary result)   Collection Time: 07/09/21  7:30 AM   Specimen: BLOOD LEFT HAND  Result Value Ref Range Status   Specimen Description   Final    BLOOD LEFT HAND Performed at [facility], [address], [place], [place] [address]    Special Requests   Final    Blood Culture adequate volume Performed at [facility], [address] [place], [place] [address]    Culture   Final    NO GROWTH 3 DAYS Performed at [facility], [place] [address], [place], [place] [address]    Report Status PENDING  Incomplete  Culture, blood (Routine X 2) w Reflex to ID Panel     Status: None (Preliminary result)   Collection Time: 07/11/21  9:15  AM   Specimen: BLOOD  Result Value Ref Range Status   Specimen Description   Final    BLOOD RIGHT ANTECUBITAL Performed at [facility] [address], [place], [place] [address]    Special Requests   Final    BOTTLES DRAWN AEROBIC ONLY Blood Culture results may not be optimal due to an inadequate volume of blood received in culture bottles Performed at [facility] [address] [place], [place] [address]    Culture   Final    NO GROWTH < 24 HOURS Performed at [facility] [address] [place], [place] [address]    Report Status PENDING  Incomplete  Culture, blood (Routine X 2) w Reflex to ID Panel     Status: None (Preliminary result)   Collection Time: 07/11/21  9:15 AM   Specimen: BLOOD  Result Value Ref Range Status   Specimen Description   Final    BLOOD BLOOD LEFT HAND Performed at [facility] [address], [place], [place] [address]    Special Requests   Final    BOTTLES DRAWN AEROBIC AND ANAEROBIC Blood Culture adequate volume Performed at [facility] [address] [place], [place] [address]    Culture   Final    NO GROWTH < 24 HOURS Performed at [facility] [address], [place],  [address]    Report Status PENDING  Incomplete             Oswald Hillock   [facility] If 7PM-7AM, please contact night-coverage at [email or web address], Office  [phone]   07/12/2021, 6:22 PM  LOS: 3 days

## 2021-07-12 NOTE — Progress Notes (Signed)
   07/11/21 2318  Assess: MEWS Score  Temp 99.2 F (37.3 C)  BP 118/66  Pulse Rate 70  Resp 18  SpO2 96 %  O2 Device Room Air  Assess: MEWS Score  MEWS Temp 0  MEWS Systolic 0  MEWS Pulse 0  MEWS RR 0  MEWS LOC 0  MEWS Score 0  MEWS Score Color Green  Assess: if the MEWS score is Yellow or Red  Were vital signs taken at a resting state? Yes  Focused Assessment No change from prior assessment  Does the patient meet 2 or more of the SIRS criteria? No  Does the patient have a confirmed or suspected source of infection? No  Provider and Rapid Response Notified? No  MEWS guidelines implemented *See Row Information* No, previously yellow, continue vital signs every 4 hours  Document  Patient Outcome Other (Comment) (remain on unit)  Progress note created (see row info) Yes  Assess: SIRS CRITERIA  SIRS Temperature  0  SIRS Pulse 0  SIRS Respirations  0  SIRS WBC 0  SIRS Score Sum  0  Patient remain green mews and remain on the unit.

## 2021-07-13 ENCOUNTER — Encounter (HOSPITAL_COMMUNITY): Payer: Self-pay | Admitting: Internal Medicine

## 2021-07-13 ENCOUNTER — Inpatient Hospital Stay (HOSPITAL_COMMUNITY): Payer: No Typology Code available for payment source

## 2021-07-13 ENCOUNTER — Encounter (HOSPITAL_COMMUNITY)
Admission: EM | Disposition: A | Discharge status: Home / Self Care | Payer: Self-pay | Source: Home / Self Care | Attending: Family Medicine

## 2021-07-13 ENCOUNTER — Inpatient Hospital Stay (HOSPITAL_COMMUNITY): Payer: No Typology Code available for payment source | Admitting: Certified Registered Nurse Anesthetist

## 2021-07-13 DIAGNOSIS — Z86711 Personal history of pulmonary embolism: Secondary | ICD-10-CM | POA: Diagnosis not present

## 2021-07-13 DIAGNOSIS — I350 Nonrheumatic aortic (valve) stenosis: Secondary | ICD-10-CM | POA: Diagnosis not present

## 2021-07-13 DIAGNOSIS — I340 Nonrheumatic mitral (valve) insufficiency: Secondary | ICD-10-CM

## 2021-07-13 DIAGNOSIS — J189 Pneumonia, unspecified organism: Secondary | ICD-10-CM | POA: Diagnosis not present

## 2021-07-13 DIAGNOSIS — N183 Chronic kidney disease, stage 3 unspecified: Secondary | ICD-10-CM | POA: Diagnosis not present

## 2021-07-13 DIAGNOSIS — Z952 Presence of prosthetic heart valve: Secondary | ICD-10-CM | POA: Diagnosis not present

## 2021-07-13 DIAGNOSIS — R509 Fever, unspecified: Secondary | ICD-10-CM | POA: Diagnosis not present

## 2021-07-13 DIAGNOSIS — C9110 Chronic lymphocytic leukemia of B-cell type not having achieved remission: Secondary | ICD-10-CM | POA: Diagnosis not present

## 2021-07-13 DIAGNOSIS — R7881 Bacteremia: Secondary | ICD-10-CM | POA: Diagnosis not present

## 2021-07-13 HISTORY — PX: TEE WITHOUT CARDIOVERSION: SHX5443

## 2021-07-13 LAB — PROTEIN ELECTROPHORESIS, SERUM
A/G Ratio: 1.5 (ref 0.7–1.7)
Albumin ELP: 3.5 g/dL (ref 2.9–4.4)
Alpha-1-Globulin: 0.3 g/dL (ref 0.0–0.4)
Alpha-2-Globulin: 0.5 g/dL (ref 0.4–1.0)
Beta Globulin: 1 g/dL (ref 0.7–1.3)
Gamma Globulin: 0.5 g/dL (ref 0.4–1.8)
Globulin, Total: 2.3 g/dL (ref 2.2–3.9)
Total Protein ELP: 5.8 g/dL — ABNORMAL LOW (ref 6.0–8.5)

## 2021-07-13 LAB — ROCKY MTN SPOTTED FVR ABS PNL(IGG+IGM)
RMSF IgG: UNDETERMINED
RMSF IgM: 1.42 index — ABNORMAL HIGH (ref 0.00–0.89)

## 2021-07-13 LAB — PROTIME-INR
INR: 1.2 (ref 0.8–1.2)
Prothrombin Time: 15.6 seconds — ABNORMAL HIGH (ref 11.4–15.2)

## 2021-07-13 LAB — CBC WITH DIFFERENTIAL/PLATELET
Abs Immature Granulocytes: 0.02 10*3/uL (ref 0.00–0.07)
Basophils Absolute: 0 10*3/uL (ref 0.0–0.1)
Basophils Relative: 0 %
Eosinophils Absolute: 0.1 10*3/uL (ref 0.0–0.5)
Eosinophils Relative: 2 %
HCT: 31.2 % — ABNORMAL LOW (ref 39.0–52.0)
Hemoglobin: 10.6 g/dL — ABNORMAL LOW (ref 13.0–17.0)
Immature Granulocytes: 1 %
Lymphocytes Relative: 17 %
Lymphs Abs: 0.6 10*3/uL — ABNORMAL LOW (ref 0.7–4.0)
MCH: 32.1 pg (ref 26.0–34.0)
MCHC: 34 g/dL (ref 30.0–36.0)
MCV: 94.5 fL (ref 80.0–100.0)
Monocytes Absolute: 0.4 10*3/uL (ref 0.1–1.0)
Monocytes Relative: 12 %
Neutro Abs: 2.4 10*3/uL (ref 1.7–7.7)
Neutrophils Relative %: 68 %
Platelets: 78 10*3/uL — ABNORMAL LOW (ref 150–400)
RBC: 3.3 MIL/uL — ABNORMAL LOW (ref 4.22–5.81)
RDW: 13.3 % (ref 11.5–15.5)
WBC: 3.5 10*3/uL — ABNORMAL LOW (ref 4.0–10.5)
nRBC: 0 % (ref 0.0–0.2)

## 2021-07-13 LAB — RMSF, IGG, IFA: RMSF, IGG, IFA: 1:128 {titer} — ABNORMAL HIGH

## 2021-07-13 LAB — COMPREHENSIVE METABOLIC PANEL
ALT: 18 U/L (ref 0–44)
AST: 27 U/L (ref 15–41)
Albumin: 3.2 g/dL — ABNORMAL LOW (ref 3.5–5.0)
Alkaline Phosphatase: 61 U/L (ref 38–126)
Anion gap: 9 (ref 5–15)
BUN: 20 mg/dL (ref 8–23)
CO2: 24 mmol/L (ref 22–32)
Calcium: 9.1 mg/dL (ref 8.9–10.3)
Chloride: 103 mmol/L (ref 98–111)
Creatinine, Ser: 1.06 mg/dL (ref 0.61–1.24)
GFR, Estimated: >60 mL/min (ref >60)
Glucose, Bld: 88 mg/dL (ref 70–99)
Potassium: 3.5 mmol/L (ref 3.5–5.1)
Sodium: 136 mmol/L (ref 135–145)
Total Bilirubin: 0.5 mg/dL (ref 0.3–1.2)
Total Protein: 5.8 g/dL — ABNORMAL LOW (ref 6.5–8.1)

## 2021-07-13 LAB — ECHO TEE
AR max vel: 1.12 cm2
AV Area VTI: 1.02 cm2
AV Area mean vel: 1.16 cm2
AV Mean grad: 32 mmHg
AV Peak grad: 51.4 mmHg
Ao pk vel: 3.59 m/s

## 2021-07-13 SURGERY — ECHOCARDIOGRAM, TRANSESOPHAGEAL
Anesthesia: Monitor Anesthesia Care

## 2021-07-13 MED ORDER — ENOXAPARIN SODIUM 100 MG/ML IJ SOSY: 1.0000 mg/kg | PREFILLED_SYRINGE | Freq: Two times a day (BID) | INTRAMUSCULAR | 0 refills | Status: DC

## 2021-07-13 MED ORDER — PROPOFOL 10 MG/ML IV BOLUS
INTRAVENOUS | Status: DC | PRN
  Administered 2021-07-13: 50 mg via INTRAVENOUS
  Administered 2021-07-13: 20 mg via INTRAVENOUS
  Administered 2021-07-13: 40 mg via INTRAVENOUS

## 2021-07-13 MED ORDER — SODIUM CHLORIDE 0.9 % IV SOLN: INTRAVENOUS | Status: DC

## 2021-07-13 MED ORDER — LIDOCAINE 2% (20 MG/ML) 5 ML SYRINGE
INTRAMUSCULAR | Status: DC | PRN
  Administered 2021-07-13: 100 mg via INTRAVENOUS

## 2021-07-13 MED ORDER — PROPOFOL 500 MG/50ML IV EMUL
INTRAVENOUS | Status: DC | PRN
  Administered 2021-07-13: 150 ug/kg/min via INTRAVENOUS
  Administered 2021-07-13: 100 ug/kg/min via INTRAVENOUS

## 2021-07-13 MED ORDER — AMOXICILLIN-POT CLAVULANATE 875-125 MG PO TABS: 1.0000 | ORAL_TABLET | Freq: Two times a day (BID) | ORAL | 0 refills | Status: AC

## 2021-07-13 NOTE — Anesthesia Postprocedure Evaluation (Signed)
Anesthesia Post Note  Patient: [name]  Procedure(s) Performed: TRANSESOPHAGEAL ECHOCARDIOGRAM (TEE)     Patient location during evaluation: PACU Anesthesia Type: MAC Level of consciousness: awake and alert and oriented Pain management: pain level controlled Vital Signs Assessment: post-procedure vital signs reviewed and stable Respiratory status: spontaneous breathing, nonlabored ventilation and respiratory function stable Cardiovascular status: stable and blood pressure returned to baseline Postop Assessment: no apparent nausea or vomiting Anesthetic complications: no   No notable events documented.  Last Vitals:  Vitals:   07/13/21 1234 07/13/21 1353  BP: 127/78 (!) 90/57  Pulse: 63 60  Resp: 16 19  Temp: 36.5 C 36.5 C  SpO2: 99% 100%    Last Pain:  Vitals:   07/13/21 1353  TempSrc: Temporal  PainSc: 0-No pain                 Teoman Giraud A.

## 2021-07-13 NOTE — Interval H&P Note (Signed)
History and Physical Interval Note:  07/13/2021 1:07 PM  [name]  has presented today for surgery, with the diagnosis of BACTEREMIA.  The various methods of treatment have been discussed with the patient and family. After consideration of risks, benefits and other options for treatment, the patient has consented to  Procedure(s): TRANSESOPHAGEAL ECHOCARDIOGRAM (TEE) (N/A) as a surgical intervention.  The patient's history has been reviewed, patient examined, no change in status, stable for surgery.  I have reviewed the patient's chart and labs.  Questions were answered to the patient's satisfaction.     Pixie Casino

## 2021-07-13 NOTE — Progress Notes (Signed)
Subjective:  He is quite hungry after being n.p.o. for TEE and dislikes the hospital food.   Antibiotics:  Anti-infectives (From admission, onward)    Start     Dose/Rate Route Frequency Ordered Stop   07/11/21 1200  amoxicillin-clavulanate (AUGMENTIN) 875-125 MG per tablet 1 tablet        1 tablet Oral Every 12 hours 07/11/21 1105 07/16/21 0959   07/09/21 1800  vancomycin (VANCOREADY) IVPB 1750 mg/350 mL  Status:  Discontinued        1,750 mg 175 mL/hr over 120 Minutes Intravenous  Once 07/09/21 1704 07/09/21 1728   07/09/21 0715  ceFAZolin (ANCEF) IVPB 2g/100 mL premix  Status:  Discontinued        2 g 200 mL/hr over 30 Minutes Intravenous Every 8 hours 07/09/21 0708 07/10/21 1229       Medications: Scheduled Meds:  amoxicillin-clavulanate  1 tablet Oral Q12H   cholecalciferol  2,000 Units Oral q morning   enoxaparin (LOVENOX) injection  1 mg/kg Subcutaneous Q12H   hydrochlorothiazide  12.5 mg Oral Daily   meclizine  25 mg Oral q morning   omeprazole  40 mg Oral QPM   rosuvastatin  10 mg Oral QHS   sertraline  100 mg Oral q morning   Continuous Infusions: PRN Meds:.acetaminophen **OR** acetaminophen, bisacodyl, ipratropium, LORazepam, metoprolol tartrate, ondansetron **OR** ondansetron (ZOFRAN) IV, senna-docusate    Objective: Weight change:   Intake/Output Summary (Last 24 hours) at 07/13/2021 1615 Last data filed at 07/13/2021 1348 Gross per 24 hour  Intake 200 ml  Output --  Net 200 ml    Blood pressure (!) 118/95, pulse 66, temperature 97.7 F (36.5 C), temperature source Oral, resp. rate 18, height '5\' 10"'$  (1.778 m), weight 89.6 kg, SpO2 100 %. Temp:  [97.7 F (36.5 C)-98.7 F (37.1 C)] 97.7 F (36.5 C) (09/02 1452) Pulse Rate:  [60-67] 66 (09/02 1452) Resp:  [14-20] 18 (09/02 1452) BP: (90-127)/(57-95) 118/95 (09/02 1452) SpO2:  [97 %-100 %] 100 % (09/02 1452) Weight:  [89.6 kg] 89.6 kg (09/02 1234)  Physical Exam: Physical  Exam Constitutional:      Appearance: He is well-developed.  HENT:     Head: Normocephalic and atraumatic.  Eyes:     Conjunctiva/sclera: Conjunctivae normal.  Cardiovascular:     Rate and Rhythm: Normal rate and regular rhythm.  Pulmonary:     Effort: Pulmonary effort is normal. No respiratory distress.     Breath sounds: No stridor. No wheezing.  Abdominal:     General: There is no distension.     Palpations: Abdomen is soft.  Musculoskeletal:        General: Normal range of motion.     Cervical back: Normal range of motion and neck supple.  Skin:    General: Skin is warm and dry.     Findings: No erythema or rash.  Neurological:     General: No focal deficit present.     Mental Status: He is alert and oriented to person, place, and time.  Psychiatric:        Mood and Affect: Mood normal.        Behavior: Behavior normal.        Thought Content: Thought content normal.        Judgment: Judgment normal.     CBC:    BMET Recent Labs    07/12/21 0540 07/13/21 0521  NA 134* 136  K 3.6 3.5  CL  102 103  CO2 25 24  GLUCOSE 98 88  BUN 17 20  CREATININE 1.26* 1.06  CALCIUM 8.8* 9.1      Liver Panel  Recent Labs    07/13/21 0521  PROT 5.8*  ALBUMIN 3.2*  AST 27  ALT 18  ALKPHOS 61  BILITOT 0.5       Sedimentation Rate Recent Labs    07/11/21 0529  ESRSEDRATE 36*    C-Reactive Protein Recent Labs    07/11/21 0529  CRP 15.8*     Micro Results: Recent Results (from the past 720 hour(s))  Resp Panel by RT-PCR (Flu A&B, Covid) Nasopharyngeal Swab     Status: None   Collection Time: 06/18/21 12:37 AM   Specimen: Nasopharyngeal Swab; Nasopharyngeal(NP) swabs in vial transport medium  Result Value Ref Range Status   SARS Coronavirus 2 by RT PCR NEGATIVE NEGATIVE Final    Comment: (NOTE) SARS-CoV-2 target nucleic acids are NOT DETECTED.  The SARS-CoV-2 RNA is generally detectable in upper respiratory specimens during the acute phase of  infection. The lowest concentration of SARS-CoV-2 viral copies this assay can detect is 138 copies/mL. A negative result does not preclude SARS-Cov-2 infection and should not be used as the sole basis for treatment or other patient management decisions. A negative result may occur with  improper specimen collection/handling, submission of specimen other than nasopharyngeal swab, presence of viral mutation(s) within the areas targeted by this assay, and inadequate number of viral copies(<138 copies/mL). A negative result must be combined with clinical observations, patient history, and epidemiological information. The expected result is Negative.  Fact Sheet for Patients:  EntrepreneurPulse.com.au  Fact Sheet for Healthcare Providers:  IncredibleEmployment.be  This test is no t yet approved or cleared by the [place] FDA and  has been authorized for detection and/or diagnosis of SARS-CoV-2 by FDA under an Emergency Use Authorization (EUA). This EUA will remain  in effect (meaning this test can be used) for the duration of the COVID-19 declaration under Section 564(b)(1) of the Act, 21 U.S.C.section 360bbb-3(b)(1), unless the authorization is terminated  or revoked sooner.       Influenza A by PCR NEGATIVE NEGATIVE Final   Influenza B by PCR NEGATIVE NEGATIVE Final    Comment: (NOTE) The Xpert Xpress SARS-CoV-2/FLU/RSV plus assay is intended as an aid in the diagnosis of influenza from Nasopharyngeal swab specimens and should not be used as a sole basis for treatment. Nasal washings and aspirates are unacceptable for Xpert Xpress SARS-CoV-2/FLU/RSV testing.  Fact Sheet for Patients: EntrepreneurPulse.com.au  Fact Sheet for Healthcare Providers: IncredibleEmployment.be  This test is not yet approved or cleared by the [place] FDA and has been authorized for detection and/or diagnosis of SARS-CoV-2  by FDA under an Emergency Use Authorization (EUA). This EUA will remain in effect (meaning this test can be used) for the duration of the COVID-19 declaration under Section 564(b)(1) of the Act, 21 U.S.C. section 360bbb-3(b)(1), unless the authorization is terminated or revoked.  Performed at [facility], [place] [address], [place], [place] [address]   Blood Culture (routine x 2)     Status: None   Collection Time: 06/18/21 10:50 PM   Specimen: BLOOD RIGHT ARM  Result Value Ref Range Status   Specimen Description BLOOD RIGHT ARM  Final   Special Requests   Final    BOTTLES DRAWN AEROBIC AND ANAEROBIC Blood Culture results may not be optimal due to an excessive volume of blood received in culture bottles  Culture   Final    NO GROWTH 5 DAYS Performed at [facility], [place] [address] [place], [place] [address]    Report Status 06/23/2021 FINAL  Final  Blood Culture (routine x 2)     Status: None   Collection Time: 06/18/21 11:00 PM   Specimen: BLOOD LEFT ARM  Result Value Ref Range Status   Specimen Description BLOOD LEFT ARM  Final   Special Requests   Final    BOTTLES DRAWN AEROBIC ONLY Blood Culture results may not be optimal due to an excessive volume of blood received in culture bottles   Culture   Final    NO GROWTH 5 DAYS Performed at [facility], [place] [address] [place], [place] [address]    Report Status 06/23/2021 FINAL  Final  Blood Culture (routine x 2)     Status: Abnormal   Collection Time: 07/07/21  6:05 AM   Specimen: BLOOD  Result Value Ref Range Status   Specimen Description   Final    BLOOD BLOOD RIGHT FOREARM Performed at [facility], [address], [place], [place] [address]    Special Requests   Final    BOTTLES DRAWN AEROBIC AND ANAEROBIC Blood Culture adequate volume Performed at [facility], [address] [place], [place] [address]    Culture  Setup Time   Final    GRAM POSITIVE COCCI  IN CLUSTERS ANAEROBIC BOTTLE ONLY CRITICAL RESULT CALLED TO, READ BACK BY AND VERIFIED WITH: Mickie Hillier RN '@1007'$  07/08/21 EB Performed at [facility], [place] [address], [place], [place] [address]    Culture STAPHYLOCOCCUS HOMINIS (A)  Final   Report Status 07/11/2021 FINAL  Final   Organism ID, Bacteria STAPHYLOCOCCUS HOMINIS  Final      Susceptibility   Staphylococcus hominis - MIC*    CIPROFLOXACIN <=0.5 SENSITIVE Sensitive     ERYTHROMYCIN <=0.25 SENSITIVE Sensitive     GENTAMICIN <=0.5 SENSITIVE Sensitive     OXACILLIN RESISTANT Resistant     TETRACYCLINE <=1 SENSITIVE Sensitive     VANCOMYCIN <=0.5 SENSITIVE Sensitive     TRIMETH/SULFA 20 SENSITIVE Sensitive     CLINDAMYCIN <=0.25 SENSITIVE Sensitive     RIFAMPIN <=0.5 SENSITIVE Sensitive     Inducible Clindamycin NEGATIVE Sensitive     * STAPHYLOCOCCUS HOMINIS  Resp Panel by RT-PCR (Flu A&B, Covid) Peripheral     Status: None   Collection Time: 07/07/21  6:05 AM   Specimen: Peripheral; Nasopharyngeal(NP) swabs in vial transport medium  Result Value Ref Range Status   SARS Coronavirus 2 by RT PCR NEGATIVE NEGATIVE Final    Comment: (NOTE) SARS-CoV-2 target nucleic acids are NOT DETECTED.  The SARS-CoV-2 RNA is generally detectable in upper respiratory specimens during the acute phase of infection. The lowest concentration of SARS-CoV-2 viral copies this assay can detect is 138 copies/mL. A negative result does not preclude SARS-Cov-2 infection and should not be used as the sole basis for treatment or other patient management decisions. A negative result may occur with  improper specimen collection/handling, submission of specimen other than nasopharyngeal swab, presence of viral mutation(s) within the areas targeted by this assay, and inadequate number of viral copies(<138 copies/mL). A negative result must be combined with clinical observations, patient history, and epidemiological information. The expected result  is Negative.  Fact Sheet for Patients:  EntrepreneurPulse.com.au  Fact Sheet for Healthcare Providers:  IncredibleEmployment.be  This test is no t yet approved or cleared by  the [employer] and  has been authorized for detection and/or diagnosis of SARS-CoV-2 by FDA under an Emergency Use Authorization (EUA). This EUA will remain  in effect (meaning this test can be used) for the duration of the COVID-19 declaration under Section 564(b)(1) of the Act, 21 U.S.C.section 360bbb-3(b)(1), unless the authorization is terminated  or revoked sooner.       Influenza A by PCR NEGATIVE NEGATIVE Final   Influenza B by PCR NEGATIVE NEGATIVE Final    Comment: (NOTE) The Xpert Xpress SARS-CoV-2/FLU/RSV plus assay is intended as an aid in the diagnosis of influenza from Nasopharyngeal swab specimens and should not be used as a sole basis for treatment. Nasal washings and aspirates are unacceptable for Xpert Xpress SARS-CoV-2/FLU/RSV testing.  Fact Sheet for Patients: EntrepreneurPulse.com.au  Fact Sheet for Healthcare Providers: IncredibleEmployment.be  This test is not yet approved or cleared by the [place] FDA and has been authorized for detection and/or diagnosis of SARS-CoV-2 by FDA under an Emergency Use Authorization (EUA). This EUA will remain in effect (meaning this test can be used) for the duration of the COVID-19 declaration under Section 564(b)(1) of the Act, 21 U.S.C. section 360bbb-3(b)(1), unless the authorization is terminated or revoked.  Performed at [facility], [address] [place], [place] [address]   Blood Culture ID Panel (Reflexed)     Status: Abnormal   Collection Time: 07/07/21  6:05 AM  Result Value Ref Range Status   Enterococcus faecalis NOT DETECTED NOT DETECTED Final   Enterococcus Faecium NOT DETECTED NOT DETECTED Final   Listeria monocytogenes NOT  DETECTED NOT DETECTED Final   Staphylococcus species DETECTED (A) NOT DETECTED Final    Comment: CRITICAL RESULT CALLED TO, READ BACK BY AND VERIFIED WITH: McCracken RN '@1007'$  07/08/21 EB    Staphylococcus aureus (BCID) NOT DETECTED NOT DETECTED Final   Staphylococcus epidermidis NOT DETECTED NOT DETECTED Final   Staphylococcus lugdunensis NOT DETECTED NOT DETECTED Final   Streptococcus species NOT DETECTED NOT DETECTED Final   Streptococcus agalactiae NOT DETECTED NOT DETECTED Final   Streptococcus pneumoniae NOT DETECTED NOT DETECTED Final   Streptococcus pyogenes NOT DETECTED NOT DETECTED Final   A.calcoaceticus-baumannii NOT DETECTED NOT DETECTED Final   Bacteroides fragilis NOT DETECTED NOT DETECTED Final   Enterobacterales NOT DETECTED NOT DETECTED Final   Enterobacter cloacae complex NOT DETECTED NOT DETECTED Final   Escherichia coli NOT DETECTED NOT DETECTED Final   Klebsiella aerogenes NOT DETECTED NOT DETECTED Final   Klebsiella oxytoca NOT DETECTED NOT DETECTED Final   Klebsiella pneumoniae NOT DETECTED NOT DETECTED Final   Proteus species NOT DETECTED NOT DETECTED Final   Salmonella species NOT DETECTED NOT DETECTED Final   Serratia marcescens NOT DETECTED NOT DETECTED Final   Haemophilus influenzae NOT DETECTED NOT DETECTED Final   Neisseria meningitidis NOT DETECTED NOT DETECTED Final   Pseudomonas aeruginosa NOT DETECTED NOT DETECTED Final   Stenotrophomonas maltophilia NOT DETECTED NOT DETECTED Final   Candida albicans NOT DETECTED NOT DETECTED Final   Candida auris NOT DETECTED NOT DETECTED Final   Candida glabrata NOT DETECTED NOT DETECTED Final   Candida krusei NOT DETECTED NOT DETECTED Final   Candida parapsilosis NOT DETECTED NOT DETECTED Final   Candida tropicalis NOT DETECTED NOT DETECTED Final   Cryptococcus neoformans/gattii NOT DETECTED NOT DETECTED Final    Comment: Performed at [facility], [address] [place], [place] [address]  MRSA Next  Gen by PCR, Nasal     Status:  None   Collection Time: 07/08/21  3:27 PM   Specimen: Nasal Mucosa; Nasal Swab  Result Value Ref Range Status   MRSA by PCR Next Gen NOT DETECTED NOT DETECTED Final    Comment: (NOTE) The GeneXpert MRSA Assay (FDA approved for NASAL specimens only), is one component of a comprehensive MRSA colonization surveillance program. It is not intended to diagnose MRSA infection nor to guide or monitor treatment for MRSA infections. Test performance is not FDA approved in patients less than 55 years old. Performed at [facility], [place] [address] [place], [place] [address]   Urine Culture     Status: None   Collection Time: 07/09/21  6:48 AM   Specimen: Urine, Clean Catch  Result Value Ref Range Status   Specimen Description   Final    URINE, CLEAN CATCH Performed at [facility], [address] [place], [place] [address]    Special Requests   Final    NONE Performed at [facility], [address], [place], [place] [address]    Culture   Final    NO GROWTH Performed at [facility], [place] [address] [place], [place] [address]    Report Status 07/10/2021 FINAL  Final  Blood culture (routine x 2)     Status: None (Preliminary result)   Collection Time: 07/09/21  7:08 AM   Specimen: BLOOD  Result Value Ref Range Status   Specimen Description   Final    BLOOD BLOOD RIGHT FOREARM Performed at [facility], [address], [place], [place] [address]    Special Requests   Final    BOTTLES DRAWN AEROBIC AND ANAEROBIC Blood Culture adequate volume Performed at [facility], [address] [place], [place] [address]    Culture   Final    NO GROWTH 4 DAYS Performed at [facility], [place] [address], [place],  [address]    Report Status PENDING  Incomplete  Resp Panel by RT-PCR (Flu A&B, Covid) Nasopharyngeal Swab     Status: None   Collection  Time: 07/09/21  7:29 AM   Specimen: Nasopharyngeal Swab; Nasopharyngeal(NP) swabs in vial transport medium  Result Value Ref Range Status   SARS Coronavirus 2 by RT PCR NEGATIVE NEGATIVE Final    Comment: (NOTE) SARS-CoV-2 target nucleic acids are NOT DETECTED.  The SARS-CoV-2 RNA is generally detectable in upper respiratory specimens during the acute phase of infection. The lowest concentration of SARS-CoV-2 viral copies this assay can detect is 138 copies/mL. A negative result does not preclude SARS-Cov-2 infection and should not be used as the sole basis for treatment or other patient management decisions. A negative result may occur with  improper specimen collection/handling, submission of specimen other than nasopharyngeal swab, presence of viral mutation(s) within the areas targeted by this assay, and inadequate number of viral copies(<138 copies/mL). A negative result must be combined with clinical observations, patient history, and epidemiological information. The expected result is Negative.  Fact Sheet for Patients:  EntrepreneurPulse.com.au  Fact Sheet for Healthcare Providers:  IncredibleEmployment.be  This test is no t yet approved or cleared by the [place] FDA and  has been authorized for detection and/or diagnosis of SARS-CoV-2 by FDA under an Emergency Use Authorization (EUA). This EUA will remain  in effect (meaning this test can be used) for the duration of the COVID-19 declaration under Section 564(b)(1) of the Act, 21 U.S.C.section 360bbb-3(b)(1), unless the authorization is terminated  or revoked  sooner.       Influenza A by PCR NEGATIVE NEGATIVE Final   Influenza B by PCR NEGATIVE NEGATIVE Final    Comment: (NOTE) The Xpert Xpress SARS-CoV-2/FLU/RSV plus assay is intended as an aid in the diagnosis of influenza from Nasopharyngeal swab specimens and should not be used as a sole basis for treatment. Nasal washings  and aspirates are unacceptable for Xpert Xpress SARS-CoV-2/FLU/RSV testing.  Fact Sheet for Patients: EntrepreneurPulse.com.au  Fact Sheet for Healthcare Providers: IncredibleEmployment.be  This test is not yet approved or cleared by the [place] FDA and has been authorized for detection and/or diagnosis of SARS-CoV-2 by FDA under an Emergency Use Authorization (EUA). This EUA will remain in effect (meaning this test can be used) for the duration of the COVID-19 declaration under Section 564(b)(1) of the Act, 21 U.S.C. section 360bbb-3(b)(1), unless the authorization is terminated or revoked.  Performed at [facility], [address] [place], [place] [address]   Blood culture (routine x 2)     Status: None (Preliminary result)   Collection Time: 07/09/21  7:30 AM   Specimen: BLOOD LEFT HAND  Result Value Ref Range Status   Specimen Description   Final    BLOOD LEFT HAND Performed at [facility], [address] [place], [place] [address]    Special Requests   Final    Blood Culture adequate volume Performed at [facility], [address] [place], [place] [address]    Culture   Final    NO GROWTH 4 DAYS Performed at [facility], [place] [address], [place], [place] [address]    Report Status PENDING  Incomplete  Culture, blood (Routine X 2) w Reflex to ID Panel     Status: None (Preliminary result)   Collection Time: 07/11/21  9:15 AM   Specimen: BLOOD  Result Value Ref Range Status   Specimen Description   Final    BLOOD RIGHT ANTECUBITAL Performed at [facility] [address], [place], [place] [address]    Special Requests   Final    BOTTLES DRAWN AEROBIC ONLY Blood Culture results may not be optimal due to an inadequate volume of blood received in culture bottles Performed at [facility] [address]  [place], [place] [address]    Culture   Final    NO GROWTH 2 DAYS Performed at [facility] [address] [place], [place] [address]    Report Status PENDING  Incomplete  Culture, blood (Routine X 2) w Reflex to ID Panel     Status: None (Preliminary result)   Collection Time: 07/11/21  9:15 AM   Specimen: BLOOD  Result Value Ref Range Status   Specimen Description   Final    BLOOD BLOOD LEFT HAND Performed at [facility] [address] [place], [place] [address]    Special Requests   Final    BOTTLES DRAWN AEROBIC AND ANAEROBIC Blood Culture adequate volume Performed at [facility] [address] [place], [place] [address]    Culture   Final    NO GROWTH 2 DAYS Performed at [facility] [address], [place], [place] [address]    Report Status PENDING  Incomplete    Studies/Results: ECHO TEE  Result Date: 07/13/2021    TRANSESOPHOGEAL ECHO REPORT   Patient Name:   [name] Date of Exam: 07/13/2021 Medical Rec #:  [id number]  Height:       70.0 in Accession #:    [id number]     Weight:       197.5 lb Date of Birth:  1946/09/24      BSA:          2.076 m Patient Age:    1 years       BP:           132/77 mmHg Patient Gender: M              HR:           68 bpm. Exam Location:  Inpatient Procedure: Transesophageal Echo, 3D Echo, Color Doppler and Cardiac Doppler Indications:    Bacteremia  History:        Patient has prior history of Echocardiogram examinations, most                 recent 07/10/2021. Arrythmias:Atrial Fibrillation; Risk                 Factors:Hypertension and Dyslipidemia. 24m Carpentier Edwards                 Aortic Valve Bioprosthetic placed 06/01/10.  Sonographer:    ERaquel SarnaSenior RDCS Referring Phys: 9[place] After discussion of the risks and benefits of a TEE, an informed consent was obtained from the patient. The transesophogeal probe was passed without difficulty through the  esophogus of the patient. Sedation performed by different physician. The patient was monitored while under deep sedation. Anesthestetic sedation was provided intravenously by Anesthesiology: '387mg'$  of Propofol, '100mg'$  of Lidocaine. The patient developed no complications during the procedure. IMPRESSIONS  1. Left atrial size was mildly dilated. No left atrial/left atrial appendage thrombus was detected.  2. The mitral valve is abnormal. Mild mitral valve regurgitation.  3. The aortic valve has been repaired/replaced. Aortic valve regurgitation is trivial. Moderate aortic valve stenosis. There appears to be leaflet restriction at the bases, however, the leaflets are pliable and mobile. No evidence of vegetation. Echo findings are consistent with stenosis of the aortic prosthesis. Aortic valve area, by VTI measures 1.02 cm. Aortic valve mean gradient measures 32.0 mmHg. Aortic valve Vmax measures 3.58 m/s. Peak gradient 51 mmHg. DI is 0.23. AVA by planimetry was 1.37  cm2.  4. Aortic dilatation noted. There is borderline dilatation of the aortic root, measuring 38 mm.  5. Right ventricular systolic function is normal. The right ventricular size is normal.  6. Left ventricular ejection fraction, by estimation, is 55 to 60%. The left ventricle has normal function. Comparison(s): A prior study was performed on 07/10/2021. TTE: LVEF 55-60%, bioprosthetic AVR - mean gradient 46.5 mmHg, AVA 0.99 cm2, DI 0.21. Conclusion(s)/Recommendation(s): Compared to a the prior echo on 8/30, the aortic valve gradients are lower (both TEE/and TTE measurements made in this study) with no significant change in DI or calculated EOA. Most likely the higher gradients and calculated stenosis were due to high cardiac output, which has now reduced. 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 FINDINGS  Left Ventricle: Left ventricular ejection fraction, by estimation, is 55 to 60%. The left ventricle  has normal function. The left ventricular internal cavity size was normal in size. Right Ventricle: The right ventricular size is normal. No increase in right ventricular wall thickness. Right ventricular systolic function is normal. Left Atrium: Left atrial size was mildly dilated. No left atrial/left atrial appendage thrombus was detected. Right  Atrium: Right atrial size was normal in size. Pericardium: There is no evidence of pericardial effusion. Mitral Valve: The mitral valve is abnormal. There is mild thickening of the mitral valve leaflet(s). Mild mitral valve regurgitation. Tricuspid Valve: The tricuspid valve is grossly normal. Tricuspid valve regurgitation is trivial. Aortic Valve: The aortic valve has been repaired/replaced. Aortic valve regurgitation is trivial. Moderate aortic stenosis is present. Aortic valve mean gradient measures 32.0 mmHg. Aortic valve peak gradient measures 51.4 mmHg. Aortic valve area, by VTI  measures 1.02 cm. There is a 25 mm Carpentier bioprosthetic valve present in the aortic position. Pulmonic Valve: The pulmonic valve was grossly normal. Pulmonic valve regurgitation is trivial. Aorta: Aortic dilatation noted. There is borderline dilatation of the aortic root, measuring 38 mm. IAS/Shunts: No atrial level shunt detected by color flow Doppler.  LEFT VENTRICLE PLAX 2D LVOT diam:     2.40 cm LV SV:         87 LV SV Index:   42 LVOT Area:     4.52 cm  AORTIC VALVE AV Area (Vmax):    1.12 cm AV Area (Vmean):   1.16 cm AV Area (VTI):     1.02 cm AV Vmax:           358.50 cm/s AV Vmean:          271.000 cm/s AV VTI:            0.849 m AV Peak Grad:      51.4 mmHg AV Mean Grad:      32.0 mmHg LVOT Vmax:         89.00 cm/s LVOT Vmean:        69.700 cm/s LVOT VTI:          0.192 m LVOT/AV VTI ratio: 0.23  SHUNTS Systemic VTI:  0.19 m Systemic Diam: 2.40 cm Lyman Bishop MD Electronically signed by Lyman Bishop MD Signature Date/Time: 07/13/2021/4:07:24 PM    Final        Assessment/Plan:  INTERVAL HISTORY: TEE with out vegetations. Principal Problem:   Bacteremia Active Problems:   Essential hypertension   PROSTATE CANCER, HX OF   Chronic lymphocytic leukemia (HCC)   S/P Bioprosthetic AVR (aortic valve replacement) in 2011   CKD (chronic kidney disease), stage III (HCC)   History of pulmonary embolism   Neutropenic fever (HCC)   FUO (fever of unknown origin)   Pancytopenia ([place])   Pneumonia of right lower lobe due to infectious organism    [name] is a 75 y.o. male with AVR, CLL on chemotherapy with recurrent episodes of fever drenching sweats myalgias diffuse body aches in the context of neutropenia found to have Staph hominis and an isolated blood culture on August 27th.  He had been seen in the ER and prescribed cefdinir but was called back and came back to the hospital 28th.  He wished not to be admitted and was changed over to levofloxacin.  Unfortunately blood cultures were not done at that time.  He has been simply admitted to the hospital for ongoing fevers and drenching sweats.  Repeat blood culture taken on admission in the context of being on levofloxacin.  Staph hominis is subsequently worked up at her request which shows it to be methicillin-resistant but fluoroquinolone sensitive.  CT chest and pelvis performed showing possible right lower lobe pneumonia  ##1 Staph hominis positive blood culture:  I think this was likely a contaminant.  See prior discussion.  TEE is negative for  endocarditis  He has not had further fevers despite not being treated for this organism.  No other blood cultures have been positive   #2 right-sided pneumonia: We will complete 5 days total of Augmentin  #3 fevers as mentioned before I think that his fevers are more from and being neutropenic and that solving his tendency for neutropenia will prevent his recurrent fevers.    #4 CLL on chemotherapy currently being held and followed  currently closely by hematology oncology  He is safe to DC on augmentin from ID standpoint.  I spent 37 minutes with the patient including face to face counseling of the patient re his blood cultures, his radiographic findings, during his updated microbiological studies CBC metabolic panel in review of medical records before and during the visit and in coordination of his care with Dr. Debara Pickett and Dr. Darrick Meigs.   LOS: 4 days   Alcide Evener 07/13/2021, 4:15 PM

## 2021-07-13 NOTE — Transfer of Care (Signed)
Immediate Anesthesia Transfer of Care Note  Patient: [name]  Procedure(s) Performed: TRANSESOPHAGEAL ECHOCARDIOGRAM (TEE)  Patient Location: Endoscopy Unit  Anesthesia Type:MAC  Level of Consciousness: awake and alert   Airway & Oxygen Therapy: Patient Spontanous Breathing  Post-op Assessment: Report given to RN and Post -op Vital signs reviewed and stable  Post vital signs: Reviewed and stable  Last Vitals:  Vitals Value Taken Time  BP 90/57 07/13/21 1353  Temp    Pulse 59 07/13/21 1354  Resp 17 07/13/21 1354  SpO2 98 % 07/13/21 1354  Vitals shown include unvalidated device data.  Last Pain:  Vitals:   07/13/21 1353  TempSrc:   PainSc: 0-No pain         Complications: No notable events documented.

## 2021-07-13 NOTE — Discharge Summary (Signed)
Physician Discharge Summary  [name] XAJ:[id number] DOB: 02-10-1946 DOA: 07/09/2021  PCP: [name], MD  Admit date: 07/09/2021 Discharge date: 07/13/2021  Time spent: 60 minutes  Recommendations for Outpatient Follow-up:  Follow-up PCP in 1 week Follow-up cardiology as outpatient to check PT/INR  Discharge Diagnoses:  Principal Problem:   Positive blood culture Active Problems:   Essential hypertension   PROSTATE CANCER, HX OF   Chronic lymphocytic leukemia ([place])   S/P Bioprosthetic AVR (aortic valve replacement) in 2011   CKD (chronic kidney disease), stage III ([place])   History of pulmonary embolism   Neutropenic fever (HCC)   FUO (fever of unknown origin)   Pancytopenia ([place])   Pneumonia of right lower lobe due to infectious organism   Discharge Condition: Stable  Diet recommendation: Heart healthy diet  Filed Weights   07/11/21 0405 07/13/21 0445 07/13/21 1234  Weight: 89.7 kg 89.6 kg 89.6 kg    History of present illness:  75 year old male with history of CLL, pancytopenia, pulmonary embolus due to travel now on chronic warfarin therapy, history of aortic valve replacement in 2011 presented to ED on 8/29 with fever and positive blood culture. Patient's original issue was that he had a nosebleed with and chills with fever 101 at home on 07/07/21; he did present to the emergency department, had cultures drawn, and was started on oral antibiotics. He was told to come back to the emergency department 07/08/21 due to finding of a positive blood culture; he came to the emergency department and improved and was sent home on oral levofloxacin. Around 2 am on 07/09/21 he developed a cold sweat, chills, then burning; temperature was 103 at home. He took Tylenol at home. ED physician discussed the case with the pharmacist who recommended Ancef, which was started in the emergency department.    Hospital Course:   Staph hominis bacteremia -1 set of blood cultures drawn on  8/27 revealed staph hominis -Repeat blood cultures drawn on 8/29 is currently pending -ID has been consulted; IV fluids that this could be contaminant or true positive and culture -TEE today shows no vegetation. -As per ID this is likely contamination, will not require treatment.   Right-sided pneumonia -Started on Augmentin for 5-day course -We will discharge on Augmentin for 3 more days.  Stop after 07/16/2021.   Acute kidney injury on CKD stage III -Patient baseline creatinine 1.5 -Creatinine back to baseline -Today creatinine is 1.06.   CLL/pancytopenia -Currently on therapy with Venetoclax -Followed by [name] -Received Neupogen in the hospital   Hypertension -Continue HCTZ   Depression/anxiety -Continue Zoloft, Ativan as needed   Hyperlipidemia -Continue Crestor    Procedures: TEE  Consultations: Infectious disease Cardiology  Discharge Exam: Vitals:   07/13/21 1410 07/13/21 1452  BP: 127/71 (!) 118/95  Pulse: 61 66  Resp: 14 18  Temp:  97.7 F (36.5 C)  SpO2: 99% 100%    General: Appears in no acute distress Cardiovascular: S1-S2, regular, no murmur auscultated Respiratory: Clear to auscultation bilaterally  Discharge Instructions   Discharge Instructions     Diet - low sodium heart healthy   Complete by: As directed    Increase activity slowly   Complete by: As directed       Allergies as of 07/13/2021       Reactions   Hydrocodone-acetaminophen Shortness Of Breath   Oxycodone Hcl Shortness Of Breath   Telmisartan-hctz Shortness Of Breath, Palpitations, Other (See Comments)   Dizziness also   Ramipril  Other (See Comments)   Unknown reaction   Aspirin Rash, Other (See Comments)   Confusion also Cannot take high dose   Atorvastatin Other (See Comments)   myalgia   Buspirone Hcl Other (See Comments)   headache   Buspirone Hcl Other (See Comments)   headache   Codeine Hives, Nausea And Vomiting, Other (See Comments)   Confusion  also   Morphine Sulfate Itching   Paroxetine Other (See Comments)   Paresthesias: R arm, R leg, r side of face   Rabeprazole Other (See Comments)   headache        Medication List     STOP taking these medications    amoxicillin 500 MG capsule Commonly known as: AMOXIL   levofloxacin 500 MG tablet Commonly known as: LEVAQUIN       TAKE these medications    amoxicillin-clavulanate 875-125 MG tablet Commonly known as: AUGMENTIN Take 1 tablet by mouth every 12 (twelve) hours for 3 days.   [employer] Sickness Relief 25 MG Chew Generic drug: Meclizine HCl CHEW 1 TABLET EVERY DAY AS NEEDED FOR DIZZINESS What changed: See the new instructions.   enoxaparin 100 MG/ML injection Commonly known as: LOVENOX Inject 0.9 mLs (90 mg total) into the skin every 12 (twelve) hours for 10 days. Take till INR is greater than 2.0   hydrochlorothiazide 12.5 MG tablet Commonly known as: HYDRODIURIL TAKE 1 TABLET BY MOUTH EVERY DAY What changed: when to take this   ipratropium 0.03 % nasal spray Commonly known as: ATROVENT Place 2 sprays into both nostrils every 12 (twelve) hours. What changed:  when to take this reasons to take this   LORazepam 0.5 MG tablet Commonly known as: ATIVAN TAKE 1 TABLET BY MOUTH EVERY DAY AS NEEDED FOR ANXIETY What changed: See the new instructions.   omeprazole 40 MG capsule Commonly known as: PRILOSEC Take 40 mg by mouth every evening.   rosuvastatin 10 MG tablet Commonly known as: CRESTOR TAKE 1 TABLET BY MOUTH EVERY DAY What changed: when to take this   sertraline 100 MG tablet Commonly known as: ZOLOFT Take 100 mg by mouth every morning.   Stool Softener 100 MG capsule Generic drug: docusate sodium Take 100 mg by mouth daily as needed (constipation).   venetoclax 100 MG tablet Commonly known as: VENCLEXTA Take 1 tablet (100 mg total) by mouth daily. Tablets should be swallowed whole with a meal and a full glass of water. What  changed: when to take this   Vitamin D3 50 MCG (2000 UT) capsule Take 2,000 Units by mouth every morning.   warfarin 5 MG tablet Commonly known as: COUMADIN Take as directed. If you are unsure how to take this medication, talk to your nurse or doctor. Original instructions: TAKE AS DIRECTED BY COUMADIN CLINIC What changed: See the new instructions.       Allergies  Allergen Reactions   Hydrocodone-Acetaminophen Shortness Of Breath   Oxycodone Hcl Shortness Of Breath   Telmisartan-Hctz Shortness Of Breath, Palpitations and Other (See Comments)    Dizziness also   Ramipril Other (See Comments)    Unknown reaction   Aspirin Rash and Other (See Comments)    Confusion also Cannot take high dose   Atorvastatin Other (See Comments)    myalgia   Buspirone Hcl Other (See Comments)    headache   Buspirone Hcl Other (See Comments)    headache   Codeine Hives, Nausea And Vomiting and Other (See Comments)    Confusion also  Morphine Sulfate Itching   Paroxetine Other (See Comments)    Paresthesias: R arm, R leg, r side of face   Rabeprazole Other (See Comments)    headache      The results of significant diagnostics from this hospitalization (including imaging, microbiology, ancillary and laboratory) are listed below for reference.    Significant Diagnostic Studies: DG Chest 2 View  Result Date: 07/08/2021 CLINICAL DATA:  Fever. EXAM: CHEST - 2 VIEW COMPARISON:  July 07, 2021. FINDINGS: The heart size and mediastinal contours are within normal limits. Both lungs are clear. Status post aortic valve repair. The visualized skeletal structures are unremarkable. IMPRESSION: No active cardiopulmonary disease. Electronically Signed   By: Marijo Conception M.D.   On: 07/08/2021 14:45   DG Chest 2 View  Result Date: 07/07/2021 CLINICAL DATA:  75 year old male with possible sepsis.  Fever.  CLL. EXAM: CHEST - 2 VIEW COMPARISON:  Chest radiographs 06/18/2021 and earlier. FINDINGS: Chronic  sternotomy and cardiac valve replacement. Normal cardiac size and mediastinal contours. Visualized tracheal air column is within normal limits. Lung volumes are within normal limits. No pneumothorax, pulmonary edema, pleural effusion or confluent pulmonary opacity. No acute osseous abnormality identified. Negative visible bowel gas pattern. Stable small dystrophic calcifications or retained radiopaque foreign bodies at the right axilla. IMPRESSION: No acute cardiopulmonary abnormality. Electronically Signed   By: Genevie Ann M.D.   On: 07/07/2021 06:48   DG Chest 2 View  Result Date: 06/18/2021 CLINICAL DATA:  Questionable sepsis - evaluate for abnormality Fever. EXAM: CHEST - 2 VIEW COMPARISON:  Most recent radiograph 05/29/2021 FINDINGS: Median sternotomy wires again seen. Prosthetic aortic valve. Normal heart size with stable mediastinal contours. No focal airspace disease or evidence of pneumonia. No pulmonary edema. No pleural fluid or pneumothorax. No acute osseous abnormalities are seen. Punctate densities again project over the right upper arm. IMPRESSION: No acute chest findings.  No evidence of pneumonia. Electronically Signed   By: Keith Rake M.D.   On: 06/18/2021 23:55   CT CHEST W CONTRAST  Result Date: 07/10/2021 CLINICAL DATA:  Fever of unknown origin. History of chronic lymphocytic leukemia. EXAM: CT CHEST, ABDOMEN, AND PELVIS WITH CONTRAST TECHNIQUE: Multidetector CT imaging of the chest, abdomen and pelvis was performed following the standard protocol during bolus administration of intravenous contrast. CONTRAST:  14mL OMNIPAQUE IOHEXOL 350 MG/ML SOLN COMPARISON:  October 18, 2020. FINDINGS: CT CHEST FINDINGS Cardiovascular: Status post aortic valve repair. Atherosclerosis of thoracic aorta is noted without aneurysm or dissection. Normal cardiac size. No pericardial effusion. Mediastinum/Nodes: Thyroid gland is unremarkable. The esophagus is unremarkable. 1.9 cm precarinal lymph node is  noted which is significantly enlarged compared to prior exam. 8 mm aortopulmonary window node is noted which is enlarged compared to prior exam. 13 mm right paratracheal lymph node is noted which is enlarged compared to prior exam. 10 mm right supraclavicular lymph node is noted which is enlarged compared to prior exam. Lungs/Pleura: No pneumothorax or pleural effusion is noted. Stable scarring is noted in right lower lobe. Increased peribronchial opacity is noted in superior segment of right lower lobe concerning for focal inflammation. Musculoskeletal: No chest wall mass or suspicious bone lesions identified. CT ABDOMEN PELVIS FINDINGS Hepatobiliary: No focal liver abnormality is seen. No gallstones, gallbladder wall thickening, or biliary dilatation. Pancreas: Unremarkable. No pancreatic ductal dilatation or surrounding inflammatory changes. Spleen: Normal in size without focal abnormality. Adrenals/Urinary Tract: Adrenal glands appear normal. Bilateral renal cysts are noted. No hydronephrosis or renal obstruction is  noted. Urinary bladder is unremarkable. Stomach/Bowel: Stomach is within normal limits. Appendix appears normal. No evidence of bowel wall thickening, distention, or inflammatory changes. Vascular/Lymphatic: Aortic atherosclerosis. 15 x 11 mm lymph node is seen to the left of the superior mesenteric trunk which is slightly smaller compared to prior exam. 7 mm lymph node is noted anterior to the abdominal aorta which is unchanged compared to prior exam 9 mm right periaortic lymph node is noted which is enlarged compared to prior exam. 1 cm left periaortic lymph node is noted just inferior to the splenic vein, which is enlarged compared to prior. Reproductive: Status post prostatectomy. Other: No abdominal wall hernia or abnormality. No abdominopelvic ascites. Musculoskeletal: No acute or significant osseous findings. IMPRESSION: Increased opacity is seen involving the superior segment of right lower  lobe concerning for focal pneumonia or other inflammation. Mildly enlarged mediastinal, supraclavicular and periaortic adenopathy is noted concerning for malignancy given the patient's history of chronic lymphocytic leukemia. Aortic Atherosclerosis (ICD10-I70.0). Electronically Signed   By: Marijo Conception M.D.   On: 07/10/2021 20:34   CT ABDOMEN PELVIS W CONTRAST  Result Date: 07/10/2021 CLINICAL DATA:  Fever of unknown origin. History of chronic lymphocytic leukemia. EXAM: CT CHEST, ABDOMEN, AND PELVIS WITH CONTRAST TECHNIQUE: Multidetector CT imaging of the chest, abdomen and pelvis was performed following the standard protocol during bolus administration of intravenous contrast. CONTRAST:  50mL OMNIPAQUE IOHEXOL 350 MG/ML SOLN COMPARISON:  October 18, 2020. FINDINGS: CT CHEST FINDINGS Cardiovascular: Status post aortic valve repair. Atherosclerosis of thoracic aorta is noted without aneurysm or dissection. Normal cardiac size. No pericardial effusion. Mediastinum/Nodes: Thyroid gland is unremarkable. The esophagus is unremarkable. 1.9 cm precarinal lymph node is noted which is significantly enlarged compared to prior exam. 8 mm aortopulmonary window node is noted which is enlarged compared to prior exam. 13 mm right paratracheal lymph node is noted which is enlarged compared to prior exam. 10 mm right supraclavicular lymph node is noted which is enlarged compared to prior exam. Lungs/Pleura: No pneumothorax or pleural effusion is noted. Stable scarring is noted in right lower lobe. Increased peribronchial opacity is noted in superior segment of right lower lobe concerning for focal inflammation. Musculoskeletal: No chest wall mass or suspicious bone lesions identified. CT ABDOMEN PELVIS FINDINGS Hepatobiliary: No focal liver abnormality is seen. No gallstones, gallbladder wall thickening, or biliary dilatation. Pancreas: Unremarkable. No pancreatic ductal dilatation or surrounding inflammatory changes.  Spleen: Normal in size without focal abnormality. Adrenals/Urinary Tract: Adrenal glands appear normal. Bilateral renal cysts are noted. No hydronephrosis or renal obstruction is noted. Urinary bladder is unremarkable. Stomach/Bowel: Stomach is within normal limits. Appendix appears normal. No evidence of bowel wall thickening, distention, or inflammatory changes. Vascular/Lymphatic: Aortic atherosclerosis. 15 x 11 mm lymph node is seen to the left of the superior mesenteric trunk which is slightly smaller compared to prior exam. 7 mm lymph node is noted anterior to the abdominal aorta which is unchanged compared to prior exam 9 mm right periaortic lymph node is noted which is enlarged compared to prior exam. 1 cm left periaortic lymph node is noted just inferior to the splenic vein, which is enlarged compared to prior. Reproductive: Status post prostatectomy. Other: No abdominal wall hernia or abnormality. No abdominopelvic ascites. Musculoskeletal: No acute or significant osseous findings. IMPRESSION: Increased opacity is seen involving the superior segment of right lower lobe concerning for focal pneumonia or other inflammation. Mildly enlarged mediastinal, supraclavicular and periaortic adenopathy is noted concerning for malignancy given the patient's  history of chronic lymphocytic leukemia. Aortic Atherosclerosis (ICD10-I70.0). Electronically Signed   By: Marijo Conception M.D.   On: 07/10/2021 20:34   CT BIOPSY  Result Date: 06/22/2021 INDICATION: 75 year old with CLL.  Request for bone marrow biopsy. EXAM: CT GUIDED BONE MARROW ASPIRATES AND BIOPSY Physician: Stephan Minister. Anselm Pancoast, MD MEDICATIONS: None. ANESTHESIA/SEDATION: Fentanyl 100 mcg IV; Versed 2.0 mg IV Moderate Sedation Time:  10 minutes The patient was continuously monitored during the procedure by the interventional radiology nurse under my direct supervision. COMPLICATIONS: None immediate. PROCEDURE: The procedure was explained to the patient. The risks  and benefits of the procedure were discussed and the patient's questions were addressed. Informed consent was obtained from the patient. The patient was placed prone on CT table. Images of the pelvis were obtained. The side of back was prepped and draped in sterile fashion. The skin and right posterior ilium were anesthetized with 1% lidocaine. OnControl bone needle was directed into the right ilium with CT guidance. Two aspirates and one core biopsy were obtained. Bandage placed over the puncture site. IMPRESSION: CT guided bone marrow aspiration and core biopsy. Electronically Signed   By: Markus Daft M.D.   On: 06/22/2021 14:10   CT BONE MARROW BIOPSY & ASPIRATION  Result Date: 06/22/2021 INDICATION: 75 year old with CLL.  Request for bone marrow biopsy. EXAM: CT GUIDED BONE MARROW ASPIRATES AND BIOPSY Physician: Stephan Minister. Anselm Pancoast, MD MEDICATIONS: None. ANESTHESIA/SEDATION: Fentanyl 100 mcg IV; Versed 2.0 mg IV Moderate Sedation Time:  10 minutes The patient was continuously monitored during the procedure by the interventional radiology nurse under my direct supervision. COMPLICATIONS: None immediate. PROCEDURE: The procedure was explained to the patient. The risks and benefits of the procedure were discussed and the patient's questions were addressed. Informed consent was obtained from the patient. The patient was placed prone on CT table. Images of the pelvis were obtained. The side of back was prepped and draped in sterile fashion. The skin and right posterior ilium were anesthetized with 1% lidocaine. OnControl bone needle was directed into the right ilium with CT guidance. Two aspirates and one core biopsy were obtained. Bandage placed over the puncture site. IMPRESSION: CT guided bone marrow aspiration and core biopsy. Electronically Signed   By: Markus Daft M.D.   On: 06/22/2021 14:10   ECHOCARDIOGRAM COMPLETE  Result Date: 07/10/2021    ECHOCARDIOGRAM REPORT   Patient Name:   DARCY CORDNER Date of Exam:  07/10/2021 Medical Rec #:  [id number]      Height:       70.0 in Accession #:    [id number]     Weight:       193.0 lb Date of Birth:  22-Aug-1946      BSA:          2.056 m Patient Age:    11 years       BP:           132/82 mmHg Patient Gender: M              HR:           83 bpm. Exam Location:  Inpatient Procedure: 2D Echo, Cardiac Doppler and Color Doppler Indications:    Bacteremia  History:        Patient has prior history of Echocardiogram examinations, most                 recent 06/21/2020. Arrythmias:Atrial Fibrillation,  Signs/Symptoms:Bacteremia; Risk Factors:Dyslipidemia. TAVR,                 afib,HLD.                 Aortic Valve: 25 mm Carpentier bioprosthetic valve is present in                 the aortic position. Procedure Date: 2011.  Sonographer:    MH Referring Phys: [place]  1. Left ventricular ejection fraction, by estimation, is 55 to 60%. The left ventricle has normal function. The left ventricle has no regional wall motion abnormalities. There is moderate concentric left ventricular hypertrophy. Left ventricular diastolic parameters are indeterminate.  2. Right ventricular systolic function is normal. The right ventricular size is normal. There is normal pulmonary artery systolic pressure.  3. Left atrial size was mildly dilated.  4. The mitral valve is normal in structure. Mild mitral valve regurgitation.  5. No vegetation or perivalvular abscess is seen. The aortic valve has been repaired/replaced. Aortic valve regurgitation is not visualized. There is a 25 mm Carpentier bioprosthetic valve present in the aortic position. Procedure Date: 2011. Echo findings are consistent with stenosis of the aortic prosthesis. Aortic valve mean gradient measures 46.5 mmHg. Aortic valve Vmax measures 4.38 m/s. Aortic valve acceleration time measures 90 msec.  6. There is moderate dilatation of the ascending aorta, measuring 47 mm. Comparison(s): Prior images reviewed  side by side. Changes from prior study are noted. The transprosthetic gradients are markedly increased, in the range of severe stenosis. This is a chronic abnormality. Although the gradients are higher since 12/13/2020, the dimensionless index and the calculated effective valve area are unchanged. This suggests that the higher gradients are due to an increase in cardiac output, superimposed on chronic aortic prosthesis mismatch/stenosis,. Conclusion(s)/Recommendation(s): No evidence of valvular vegetations on this transthoracic echocardiogram. Would recommend a transesophageal echocardiogram to exclude infective endocarditis if clinically indicated. FINDINGS  Left Ventricle: Left ventricular ejection fraction, by estimation, is 55 to 60%. The left ventricle has normal function. The left ventricle has no regional wall motion abnormalities. The left ventricular internal cavity size was normal in size. There is  moderate concentric left ventricular hypertrophy. Left ventricular diastolic parameters are indeterminate. Right Ventricle: The right ventricular size is normal. No increase in right ventricular wall thickness. Right ventricular systolic function is normal. There is normal pulmonary artery systolic pressure. The tricuspid regurgitant velocity is 2.71 m/s, and  with an assumed right atrial pressure of 3 mmHg, the estimated right ventricular systolic pressure is 41.9 mmHg. Left Atrium: Left atrial size was mildly dilated. Right Atrium: Right atrial size was normal in size. Pericardium: There is no evidence of pericardial effusion. Mitral Valve: The mitral valve is normal in structure. Mild mitral annular calcification. Mild mitral valve regurgitation. Tricuspid Valve: Diastolic TR is seen due to 1st degree AV block. The tricuspid valve is normal in structure. Tricuspid valve regurgitation is trivial. Aortic Valve: No vegetation or perivalvular abscess is seen. The aortic valve has been repaired/replaced. Aortic  valve regurgitation is not visualized. Aortic valve mean gradient measures 46.5 mmHg. Aortic valve peak gradient measures 76.9 mmHg. Aortic valve area, by VTI measures 0.99 cm. There is a 25 mm Carpentier bioprosthetic valve present in the aortic position. Procedure Date: 2011. Pulmonic Valve: The pulmonic valve was grossly normal. Pulmonic valve regurgitation is not visualized. Aorta: The aortic root is normal in size and structure. There is moderate dilatation of the ascending  aorta, measuring 47 mm. IAS/Shunts: No atrial level shunt detected by color flow Doppler.  LEFT VENTRICLE PLAX 2D LVIDd:         5.00 cm     Diastology LVIDs:         3.50 cm     LV e' medial:    7.51 cm/s LV PW:         1.60 cm     LV E/e' medial:  12.0 LV IVS:        1.60 cm     LV e' lateral:   10.80 cm/s LVOT diam:     2.30 cm     LV E/e' lateral: 8.4 LV SV:         88 LV SV Index:   43 LVOT Area:     4.15 cm  LV Volumes (MOD) LV vol d, MOD A4C: 82.7 ml LV vol s, MOD A4C: 26.7 ml LV SV MOD A4C:     82.7 ml RIGHT VENTRICLE RV S prime:     14.60 cm/s TAPSE (M-mode): 2.0 cm LEFT ATRIUM             Index       RIGHT ATRIUM           Index LA diam:        3.80 cm 1.85 cm/m  RA Area:     15.00 cm LA Vol (A2C):   48.4 ml 23.54 ml/m RA Volume:   29.70 ml  14.45 ml/m LA Vol (A4C):   54.4 ml 26.46 ml/m LA Biplane Vol: 51.8 ml 25.20 ml/m  AORTIC VALVE AV Area (Vmax):    1.07 cm AV Area (Vmean):   1.46 cm AV Area (VTI):     0.99 cm AV Vmax:           438.43 cm/s AV Vmean:          216.980 cm/s AV VTI:            0.889 m AV Peak Grad:      76.9 mmHg AV Mean Grad:      46.5 mmHg LVOT Vmax:         113.00 cm/s LVOT Vmean:        76.500 cm/s LVOT VTI:          0.211 m LVOT/AV VTI ratio: 0.24  AORTA Ao Root diam: 3.60 cm Ao Asc diam:  4.70 cm MITRAL VALVE               TRICUSPID VALVE MV Area (PHT): 3.61 cm    TR Peak grad:   29.4 mmHg MV E velocity: 90.41 cm/s  TR Vmax:        271.00 cm/s MV A velocity: 66.80 cm/s MV E/A ratio:  1.35         SHUNTS                            Systemic VTI:  0.21 m                            Systemic Diam: 2.30 cm Sanda Klein MD Electronically signed by Sanda Klein MD Signature Date/Time: 07/10/2021/1:38:57 PM    Final    ECHO TEE  Result Date: 07/13/2021    TRANSESOPHOGEAL ECHO REPORT   Patient Name:   [name] Date of Exam: 07/13/2021 Medical Rec #:  [id number]  Height:       70.0 in Accession #:    [id number]     Weight:       197.5 lb Date of Birth:  Jan 08, 1946      BSA:          2.076 m Patient Age:    41 years       BP:           132/77 mmHg Patient Gender: M              HR:           68 bpm. Exam Location:  Inpatient Procedure: Transesophageal Echo, 3D Echo, Color Doppler and Cardiac Doppler Indications:    Bacteremia  History:        Patient has prior history of Echocardiogram examinations, most                 recent 07/10/2021. Arrythmias:Atrial Fibrillation; Risk                 Factors:Hypertension and Dyslipidemia. 27mm Carpentier Edwards                 Aortic Valve Bioprosthetic placed 06/01/10.  Sonographer:    Raquel Sarna Senior RDCS Referring Phys: [place]: After discussion of the risks and benefits of a TEE, an informed consent was obtained from the patient. The transesophogeal probe was passed without difficulty through the esophogus of the patient. Sedation performed by different physician. The patient was monitored while under deep sedation. Anesthestetic sedation was provided intravenously by Anesthesiology: 387mg  of Propofol, 100mg  of Lidocaine. The patient developed no complications during the procedure. IMPRESSIONS  1. Left atrial size was mildly dilated. No left atrial/left atrial appendage thrombus was detected.  2. The mitral valve is abnormal. Mild mitral valve regurgitation.  3. The aortic valve has been repaired/replaced. Aortic valve regurgitation is trivial. Moderate aortic valve stenosis. There appears to be leaflet restriction at the bases, however, the  leaflets are pliable and mobile. No evidence of vegetation. Echo findings are consistent with stenosis of the aortic prosthesis. Aortic valve area, by VTI measures 1.02 cm. Aortic valve mean gradient measures 32.0 mmHg. Aortic valve Vmax measures 3.58 m/s. Peak gradient 51 mmHg. DI is 0.23. AVA by planimetry was 1.37  cm2.  4. Aortic dilatation noted. There is borderline dilatation of the aortic root, measuring 38 mm.  5. Right ventricular systolic function is normal. The right ventricular size is normal.  6. Left ventricular ejection fraction, by estimation, is 55 to 60%. The left ventricle has normal function. Comparison(s): A prior study was performed on 07/10/2021. TTE: LVEF 55-60%, bioprosthetic AVR - mean gradient 46.5 mmHg, AVA 0.99 cm2, DI 0.21. Conclusion(s)/Recommendation(s): Compared to a the prior echo on 8/30, the aortic valve gradients are lower (both TEE/and TTE measurements made in this study) with no significant change in DI or calculated EOA. Most likely the higher gradients and calculated stenosis were due to high cardiac output, which has now reduced. 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 FINDINGS  Left Ventricle: Left ventricular ejection fraction, by estimation, is 55 to 60%. The left ventricle has normal function. The left ventricular internal cavity size was normal in size. Right Ventricle: The right ventricular size is normal. No increase in right ventricular wall thickness. Right ventricular systolic function is normal. Left Atrium: Left atrial size was mildly dilated. No left atrial/left atrial appendage thrombus was detected. Right Atrium:  Right atrial size was normal in size. Pericardium: There is no evidence of pericardial effusion. Mitral Valve: The mitral valve is abnormal. There is mild thickening of the mitral valve leaflet(s). Mild mitral valve regurgitation. Tricuspid Valve: The tricuspid valve is grossly normal. Tricuspid valve  regurgitation is trivial. Aortic Valve: The aortic valve has been repaired/replaced. Aortic valve regurgitation is trivial. Moderate aortic stenosis is present. Aortic valve mean gradient measures 32.0 mmHg. Aortic valve peak gradient measures 51.4 mmHg. Aortic valve area, by VTI  measures 1.02 cm. There is a 25 mm Carpentier bioprosthetic valve present in the aortic position. Pulmonic Valve: The pulmonic valve was grossly normal. Pulmonic valve regurgitation is trivial. Aorta: Aortic dilatation noted. There is borderline dilatation of the aortic root, measuring 38 mm. IAS/Shunts: No atrial level shunt detected by color flow Doppler.  LEFT VENTRICLE PLAX 2D LVOT diam:     2.40 cm LV SV:         87 LV SV Index:   42 LVOT Area:     4.52 cm  AORTIC VALVE AV Area (Vmax):    1.12 cm AV Area (Vmean):   1.16 cm AV Area (VTI):     1.02 cm AV Vmax:           358.50 cm/s AV Vmean:          271.000 cm/s AV VTI:            0.849 m AV Peak Grad:      51.4 mmHg AV Mean Grad:      32.0 mmHg LVOT Vmax:         89.00 cm/s LVOT Vmean:        69.700 cm/s LVOT VTI:          0.192 m LVOT/AV VTI ratio: 0.23  SHUNTS Systemic VTI:  0.19 m Systemic Diam: 2.40 cm Lyman Bishop MD Electronically signed by Lyman Bishop MD Signature Date/Time: 07/13/2021/4:07:24 PM    Final     Microbiology: Recent Results (from the past 240 hour(s))  Blood Culture (routine x 2)     Status: Abnormal   Collection Time: 07/07/21  6:05 AM   Specimen: BLOOD  Result Value Ref Range Status   Specimen Description   Final    BLOOD BLOOD RIGHT FOREARM Performed at [facility], [address] [place], [place] [address]    Special Requests   Final    BOTTLES DRAWN AEROBIC AND ANAEROBIC Blood Culture adequate volume Performed at [facility], [address] [place], [place] [address]    Culture  Setup Time   Final    GRAM POSITIVE COCCI IN CLUSTERS ANAEROBIC BOTTLE ONLY CRITICAL RESULT CALLED TO, READ BACK  BY AND VERIFIED WITH: Mickie Hillier RN [email or web address]  07/08/21 EB Performed at [facility], [name] [place], [place] [address]    Culture STAPHYLOCOCCUS HOMINIS (A)  Final   Report Status 07/11/2021 FINAL  Final   Organism ID, Bacteria STAPHYLOCOCCUS HOMINIS  Final      Susceptibility   Staphylococcus hominis - MIC*    CIPROFLOXACIN <=0.5 SENSITIVE Sensitive     ERYTHROMYCIN <=0.25 SENSITIVE Sensitive     GENTAMICIN <=0.5 SENSITIVE Sensitive     OXACILLIN RESISTANT Resistant     TETRACYCLINE <=1 SENSITIVE Sensitive     VANCOMYCIN <=0.5 SENSITIVE Sensitive     TRIMETH/SULFA 20 SENSITIVE Sensitive     CLINDAMYCIN <=0.25 SENSITIVE Sensitive     RIFAMPIN <=0.5 SENSITIVE Sensitive     Inducible  Clindamycin NEGATIVE Sensitive     * STAPHYLOCOCCUS HOMINIS  Resp Panel by RT-PCR (Flu A&B, Covid) Peripheral     Status: None   Collection Time: 07/07/21  6:05 AM   Specimen: Peripheral; Nasopharyngeal(NP) swabs in vial transport medium  Result Value Ref Range Status   SARS Coronavirus 2 by RT PCR NEGATIVE NEGATIVE Final    Comment: (NOTE) SARS-CoV-2 target nucleic acids are NOT DETECTED.  The SARS-CoV-2 RNA is generally detectable in upper respiratory specimens during the acute phase of infection. The lowest concentration of SARS-CoV-2 viral copies this assay can detect is 138 copies/mL. A negative result does not preclude SARS-Cov-2 infection and should not be used as the sole basis for treatment or other patient management decisions. A negative result may occur with  improper specimen collection/handling, submission of specimen other than nasopharyngeal swab, presence of viral mutation(s) within the areas targeted by this assay, and inadequate number of viral copies(<138 copies/mL). A negative result must be combined with clinical observations, patient history, and epidemiological information. The expected result is Negative.  Fact Sheet for Patients:   EntrepreneurPulse.com.au  Fact Sheet for Healthcare Providers:  IncredibleEmployment.be  This test is no t yet approved or cleared by the [place] FDA and  has been authorized for detection and/or diagnosis of SARS-CoV-2 by FDA under an Emergency Use Authorization (EUA). This EUA will remain  in effect (meaning this test can be used) for the duration of the COVID-19 declaration under Section 564(b)(1) of the Act, 21 U.S.C.section 360bbb-3(b)(1), unless the authorization is terminated  or revoked sooner.       Influenza A by PCR NEGATIVE NEGATIVE Final   Influenza B by PCR NEGATIVE NEGATIVE Final    Comment: (NOTE) The Xpert Xpress SARS-CoV-2/FLU/RSV plus assay is intended as an aid in the diagnosis of influenza from Nasopharyngeal swab specimens and should not be used as a sole basis for treatment. Nasal washings and aspirates are unacceptable for Xpert Xpress SARS-CoV-2/FLU/RSV testing.  Fact Sheet for Patients: EntrepreneurPulse.com.au  Fact Sheet for Healthcare Providers: IncredibleEmployment.be  This test is not yet approved or cleared by the [place] FDA and has been authorized for detection and/or diagnosis of SARS-CoV-2 by FDA under an Emergency Use Authorization (EUA). This EUA will remain in effect (meaning this test can be used) for the duration of the COVID-19 declaration under Section 564(b)(1) of the Act, 21 U.S.C. section 360bbb-3(b)(1), unless the authorization is terminated or revoked.  Performed at [facility], [address] [place], [place] [address]   Blood Culture ID Panel (Reflexed)     Status: Abnormal   Collection Time: 07/07/21  6:05 AM  Result Value Ref Range Status   Enterococcus faecalis NOT DETECTED NOT DETECTED Final   Enterococcus Faecium NOT DETECTED NOT DETECTED Final   Listeria monocytogenes NOT DETECTED NOT DETECTED Final    Staphylococcus species DETECTED (A) NOT DETECTED Final    Comment: CRITICAL RESULT CALLED TO, READ BACK BY AND VERIFIED WITH: [place] RN [email or web address]  07/08/21 EB    Staphylococcus aureus (BCID) NOT DETECTED NOT DETECTED Final   Staphylococcus epidermidis NOT DETECTED NOT DETECTED Final   Staphylococcus lugdunensis NOT DETECTED NOT DETECTED Final   Streptococcus species NOT DETECTED NOT DETECTED Final   Streptococcus agalactiae NOT DETECTED NOT DETECTED Final   Streptococcus pneumoniae NOT DETECTED NOT DETECTED Final   Streptococcus pyogenes NOT DETECTED NOT DETECTED Final   A.calcoaceticus-baumannii NOT DETECTED NOT DETECTED Final   Bacteroides fragilis NOT DETECTED NOT DETECTED Final  Enterobacterales NOT DETECTED NOT DETECTED Final   Enterobacter cloacae complex NOT DETECTED NOT DETECTED Final   Escherichia coli NOT DETECTED NOT DETECTED Final   Klebsiella aerogenes NOT DETECTED NOT DETECTED Final   Klebsiella oxytoca NOT DETECTED NOT DETECTED Final   Klebsiella pneumoniae NOT DETECTED NOT DETECTED Final   Proteus species NOT DETECTED NOT DETECTED Final   Salmonella species NOT DETECTED NOT DETECTED Final   Serratia marcescens NOT DETECTED NOT DETECTED Final   Haemophilus influenzae NOT DETECTED NOT DETECTED Final   Neisseria meningitidis NOT DETECTED NOT DETECTED Final   Pseudomonas aeruginosa NOT DETECTED NOT DETECTED Final   Stenotrophomonas maltophilia NOT DETECTED NOT DETECTED Final   Candida albicans NOT DETECTED NOT DETECTED Final   Candida auris NOT DETECTED NOT DETECTED Final   Candida glabrata NOT DETECTED NOT DETECTED Final   Candida krusei NOT DETECTED NOT DETECTED Final   Candida parapsilosis NOT DETECTED NOT DETECTED Final   Candida tropicalis NOT DETECTED NOT DETECTED Final   Cryptococcus neoformans/gattii NOT DETECTED NOT DETECTED Final    Comment: Performed at [facility], [place] [address] [place], [place] [address]  MRSA Next Gen by PCR, Nasal     Status:  None   Collection Time: 07/08/21  3:27 PM   Specimen: Nasal Mucosa; Nasal Swab  Result Value Ref Range Status   MRSA by PCR Next Gen NOT DETECTED NOT DETECTED Final    Comment: (NOTE) The GeneXpert MRSA Assay (FDA approved for NASAL specimens only), is one component of a comprehensive MRSA colonization surveillance program. It is not intended to diagnose MRSA infection nor to guide or monitor treatment for MRSA infections. Test performance is not FDA approved in patients less than 39 years old. Performed at [facility], [place] [address] [place], [place] [address]   Urine Culture     Status: None   Collection Time: 07/09/21  6:48 AM   Specimen: Urine, Clean Catch  Result Value Ref Range Status   Specimen Description   Final    URINE, CLEAN CATCH Performed at [facility], [address], [place], [place] [address]    Special Requests   Final    NONE Performed at [facility], [address], [place], [place] [address]    Culture   Final    NO GROWTH Performed at [facility], [place] [address], [place], [place] [address]    Report Status 07/10/2021 FINAL  Final  Blood culture (routine x 2)     Status: None (Preliminary result)   Collection Time: 07/09/21  7:08 AM   Specimen: BLOOD  Result Value Ref Range Status   Specimen Description   Final    BLOOD BLOOD RIGHT FOREARM Performed at [facility], [address], [place], [place] [address]    Special Requests   Final    BOTTLES DRAWN AEROBIC AND ANAEROBIC Blood Culture adequate volume Performed at [facility], [address], [place], [place] [address]    Culture   Final    NO GROWTH 4 DAYS Performed at [facility], [place] [address], [place],  [address]    Report Status PENDING  Incomplete  Resp Panel by RT-PCR (Flu A&B, Covid) Nasopharyngeal Swab     Status: None   Collection Time: 07/09/21  7:29 AM    Specimen: Nasopharyngeal Swab; Nasopharyngeal(NP) swabs in vial transport medium  Result Value Ref Range Status   SARS Coronavirus 2 by RT PCR NEGATIVE NEGATIVE  Final    Comment: (NOTE) SARS-CoV-2 target nucleic acids are NOT DETECTED.  The SARS-CoV-2 RNA is generally detectable in upper respiratory specimens during the acute phase of infection. The lowest concentration of SARS-CoV-2 viral copies this assay can detect is 138 copies/mL. A negative result does not preclude SARS-Cov-2 infection and should not be used as the sole basis for treatment or other patient management decisions. A negative result may occur with  improper specimen collection/handling, submission of specimen other than nasopharyngeal swab, presence of viral mutation(s) within the areas targeted by this assay, and inadequate number of viral copies(<138 copies/mL). A negative result must be combined with clinical observations, patient history, and epidemiological information. The expected result is Negative.  Fact Sheet for Patients:  EntrepreneurPulse.com.au  Fact Sheet for Healthcare Providers:  IncredibleEmployment.be  This test is no t yet approved or cleared by the [place] FDA and  has been authorized for detection and/or diagnosis of SARS-CoV-2 by FDA under an Emergency Use Authorization (EUA). This EUA will remain  in effect (meaning this test can be used) for the duration of the COVID-19 declaration under Section 564(b)(1) of the Act, 21 U.S.C.section 360bbb-3(b)(1), unless the authorization is terminated  or revoked sooner.       Influenza A by PCR NEGATIVE NEGATIVE Final   Influenza B by PCR NEGATIVE NEGATIVE Final    Comment: (NOTE) The Xpert Xpress SARS-CoV-2/FLU/RSV plus assay is intended as an aid in the diagnosis of influenza from Nasopharyngeal swab specimens and should not be used as a sole basis for treatment. Nasal washings and aspirates are  unacceptable for Xpert Xpress SARS-CoV-2/FLU/RSV testing.  Fact Sheet for Patients: EntrepreneurPulse.com.au  Fact Sheet for Healthcare Providers: IncredibleEmployment.be  This test is not yet approved or cleared by the [place] FDA and has been authorized for detection and/or diagnosis of SARS-CoV-2 by FDA under an Emergency Use Authorization (EUA). This EUA will remain in effect (meaning this test can be used) for the duration of the COVID-19 declaration under Section 564(b)(1) of the Act, 21 U.S.C. section 360bbb-3(b)(1), unless the authorization is terminated or revoked.  Performed at [facility], [address], [place], [place] [address]   Blood culture (routine x 2)     Status: None (Preliminary result)   Collection Time: 07/09/21  7:30 AM   Specimen: BLOOD LEFT HAND  Result Value Ref Range Status   Specimen Description   Final    BLOOD LEFT HAND Performed at [facility], [address] [place], [place] [address]    Special Requests   Final    Blood Culture adequate volume Performed at [facility], [address] [place], [place] [address]    Culture   Final    NO GROWTH 4 DAYS Performed at [facility], [place] [address], [place], [place] [address]    Report Status PENDING  Incomplete  Culture, blood (Routine X 2) w Reflex to ID Panel     Status: None (Preliminary result)   Collection Time: 07/11/21  9:15 AM   Specimen: BLOOD  Result Value Ref Range Status   Specimen Description   Final    BLOOD RIGHT ANTECUBITAL Performed at [facility] [address], [place], [place] [address]    Special Requests   Final    BOTTLES DRAWN AEROBIC ONLY Blood Culture results may not be optimal due to an inadequate volume of blood received in culture bottles Performed at [facility]  [place], [place] [address]     Culture   Final    NO GROWTH 2 DAYS Performed at [facility], [place] [address], [place], [place] [address]    Report Status PENDING  Incomplete  Culture, blood (Routine X 2) w Reflex to ID Panel     Status: None (Preliminary result)   Collection Time: 07/11/21  9:15 AM   Specimen: BLOOD  Result Value Ref Range Status   Specimen Description   Final    BLOOD BLOOD LEFT HAND Performed at [facility] [address], [place], [place] [address]    Special Requests   Final    BOTTLES DRAWN AEROBIC AND ANAEROBIC Blood Culture adequate volume Performed at [facility] [address] [place], [place] [address]    Culture   Final    NO GROWTH 2 DAYS Performed at [facility] [address] [place],  [address]    Report Status PENDING  Incomplete     Labs: Basic Metabolic Panel: Recent Labs  Lab 07/09/21 0708 07/10/21 0521 07/11/21 0529 07/12/21 0540 07/13/21 0521  NA 134* 135 139 134* 136  K 3.7 3.6 3.8 3.6 3.5  CL 100 102 103 102 103  CO2 $Re'25 25 28 25 24  'UFJ$ GLUCOSE 114* 105* 103* 98 88  BUN 28* $Remov'22 21 17 20  'BqBiTf$ CREATININE 1.99* 1.47* 1.49* 1.26* 1.06  CALCIUM 8.8* 8.8* 9.1 8.8* 9.1   Liver Function Tests: Recent Labs  Lab 07/07/21 0605 07/13/21 0521  AST 22 27  ALT 16 18  ALKPHOS 55 61  BILITOT 0.6 0.5  PROT 6.2* 5.8*  ALBUMIN 3.7 3.2*   No results for input(s): LIPASE, AMYLASE in the last 168 hours. No results for input(s): AMMONIA in the last 168 hours. CBC: Recent Labs  Lab 07/07/21 0605 07/09/21 0708 07/10/21 0521 07/11/21 0529 07/12/21 0815 07/13/21 0521  WBC 2.6* 1.9* 1.8* 6.8 5.2 3.5*  NEUTROABS 1.3* 1.1*  --  6.0 4.2 2.4  HGB 11.5* 11.1* 11.0* 10.9* 10.4* 10.6*  HCT 34.1* 33.7* 34.0* 32.7* 30.9* 31.2*  MCV 95.8 96.0 99.1 96.5 95.7 94.5  PLT 102* 76* 77* 69* 68* 78*   Cardiac Enzymes: Recent Labs  Lab 07/11/21 0529  CKTOTAL 73   BNP: BNP (last 3 results) No results for input(s): BNP in  the last 8760 hours.  ProBNP (last 3 results) No results for input(s): PROBNP in the last 8760 hours.  CBG: No results for input(s): GLUCAP in the last 168 hours.     Signed:  Oswald Hillock MD.  [facility] 07/13/2021, 5:12 PM

## 2021-07-13 NOTE — Progress Notes (Signed)
ANTICOAGULATION CONSULT NOTE - follow up  Pharmacy Consult for LMWH bridge while warfarin on hold for TEE on 9/2 Indication: Hx PE/DVT, AVR  Allergies  Allergen Reactions   Hydrocodone-Acetaminophen Shortness Of Breath   Oxycodone Hcl Shortness Of Breath   Telmisartan-Hctz Shortness Of Breath, Palpitations and Other (See Comments)    Dizziness also   Ramipril Other (See Comments)    Unknown reaction   Aspirin Rash and Other (See Comments)    Confusion also Cannot take high dose   Atorvastatin Other (See Comments)    myalgia   Buspirone Hcl Other (See Comments)    headache   Buspirone Hcl Other (See Comments)    headache   Codeine Hives, Nausea And Vomiting and Other (See Comments)    Confusion also   Morphine Sulfate Itching   Paroxetine Other (See Comments)    Paresthesias: R arm, R leg, r side of face   Rabeprazole Other (See Comments)    headache    Patient Measurements: Height: '5\' 10"'$  (177.8 cm) Weight: 89.6 kg (197 lb 8.5 oz) IBW/kg (Calculated) : 73 Heparin Dosing Weight:  Vital Signs: Temp: 98.7 F (37.1 C) (09/02 0445) Temp Source: Oral (09/02 0445) BP: 110/68 (09/02 0445) Pulse Rate: 60 (09/02 0445)  Labs: Recent Labs    07/10/21 1100 07/11/21 0529 07/11/21 0529 07/12/21 0540 07/12/21 0815 07/13/21 0521  HGB  --  10.9*   < >  --  10.4* 10.6*  HCT  --  32.7*  --   --  30.9* 31.2*  PLT  --  69*  --   --  68* 78*  LABPROT 25.3* 21.9*  --   --   --  15.6*  INR 2.3* 1.9*  --   --   --  1.2  CREATININE  --  1.49*  --  1.26*  --   --   CKTOTAL  --  73  --   --   --   --    < > = values in this interval not displayed.     Estimated Creatinine Clearance: 57 mL/min (A) (by C-G formula based on SCr of 1.26 mg/dL (H)).   Assessment: 32 yoM admitted on 8/29 for fevers, neutropenia, hx of CLL.  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  Pharmacy is consulted to resume warfarin PTA Warfarin 7.5 mg daily except '5mg'$  on Mondays.  Last dose taken on 8/28 at  1830.   Admit INR 2.8 CBC:  Hgb 11.1 (baseline ~11.5-12), Plt 76 k (baseline 76-142k) Drug-drug interactions: PTA Venetoclax may increase the serum concentration of Warfarin Diet: regular INR goal per coumadin clinic 2.5-3 - last outpatient check 06/21/2021 INR 3.1  07/13/21 INR 1.2 as expected Pt getting TEE today, Lovenox dose held this AM, to restart this evening after returns from procedure CBC: Hgb 10.6 stable, Plts 78 stable No reported bleeding SCr 1.26, CrCl ~ 57  Goal of Therapy:  Heparin level 0.3 - 0.7 Monitor platelets by anticoagulation protocol: Yes   Plan:  Lovenox 1 mg/kg sq q12 hrs to restart tonight after TEE Follow up plan for restart of warfarin   Adrian Saran, PharmD, BCPS Secure Chat if ?s 07/13/2021 9:07 AM

## 2021-07-13 NOTE — Progress Notes (Signed)
Responded to consult for IV. No scheduled IV medication due. No documentation of need for current PRN medication to this point. Spoke with RN regarding access needs. In agreement with holding on IV access and will consult if needed.

## 2021-07-13 NOTE — Progress Notes (Addendum)
Progress Note  Patient Name: [name] Date of Encounter: 07/13/2021  [facility] Cardiologist: Jenkins Rouge, MD   Subjective   Denies any chest pain, dyspnea, lightheadedness.    Inpatient Medications    Scheduled Meds:  amoxicillin-clavulanate  1 tablet Oral Q12H   cholecalciferol  2,000 Units Oral q morning   enoxaparin (LOVENOX) injection  1 mg/kg Subcutaneous Q12H   hydrochlorothiazide  12.5 mg Oral Daily   meclizine  25 mg Oral q morning   omeprazole  40 mg Oral QPM   rosuvastatin  10 mg Oral QHS   sertraline  100 mg Oral q morning   Continuous Infusions:  sodium chloride     PRN Meds: acetaminophen **OR** acetaminophen, bisacodyl, ipratropium, LORazepam, metoprolol tartrate, ondansetron **OR** ondansetron (ZOFRAN) IV, senna-docusate   Vital Signs    Vitals:   07/12/21 1332 07/12/21 1946 07/13/21 0445 07/13/21 1148  BP: 117/73 119/72 110/68 96/66  Pulse: 64 64 60 67  Resp:  '20 20 20  '$ Temp: 98.5 F (36.9 C) 98.4 F (36.9 C) 98.7 F (37.1 C)   TempSrc: Oral Oral Oral Oral  SpO2: 99% 97% 98% 98%  Weight:   89.6 kg   Height:       No intake or output data in the 24 hours ending 07/13/21 1155  Last 3 Weights 07/13/2021 07/11/2021 07/09/2021  Weight (lbs) 197 lb 8.5 oz 197 lb 12 oz 193 lb  Weight (kg) 89.6 kg 89.7 kg 87.544 kg      Telemetry    Normal sinus rhythm rate 60s personally Reviewed  ECG    No new ECG - Personally Reviewed  Physical Exam   GEN: No acute distress.   Neck: No JVD Cardiac: RRR, 3/6 systolic murmur Respiratory: Clear to auscultation bilaterally. GI: Soft, nontender, non-distended  MS: No edema; No deformity. Neuro:  Nonfocal  Psych: Normal affect   Labs    High Sensitivity Troponin:  No results for input(s): TROPONINIHS in the last 720 hours.    Chemistry Recent Labs  Lab 07/07/21 0605 07/09/21 0708 07/11/21 0529 07/12/21 0540 07/13/21 0521  NA 135   < > 139 134* 136  K 3.9   < > 3.8 3.6 3.5  CL 102   <  > 103 102 103  CO2 25   < > '28 25 24  '$ GLUCOSE 99   < > 103* 98 88  BUN 22   < > '21 17 20  '$ CREATININE 1.53*   < > 1.49* 1.26* 1.06  CALCIUM 8.7*   < > 9.1 8.8* 9.1  PROT 6.2*  --   --   --  5.8*  ALBUMIN 3.7  --   --   --  3.2*  AST 22  --   --   --  27  ALT 16  --   --   --  18  ALKPHOS 55  --   --   --  61  BILITOT 0.6  --   --   --  0.5  GFRNONAA 47*   < > 49* 59* >60  ANIONGAP 8   < > '8 7 9   '$ < > = values in this interval not displayed.      Hematology Recent Labs  Lab 07/11/21 0529 07/12/21 0815 07/13/21 0521  WBC 6.8 5.2 3.5*  RBC 3.39* 3.23* 3.30*  HGB 10.9* 10.4* 10.6*  HCT 32.7* 30.9* 31.2*  MCV 96.5 95.7 94.5  MCH 32.2 32.2 32.1  MCHC 33.3  33.7 34.0  RDW 13.5 13.5 13.3  PLT 69* 68* 78*     BNPNo results for input(s): BNP, PROBNP in the last 168 hours.   DDimer No results for input(s): DDIMER in the last 168 hours.   Radiology    No results found.  Cardiac Studies   Echo 8/30:  1. Left ventricular ejection fraction, by estimation, is 55 to 60%. The  left ventricle has normal function. The left ventricle has no regional  wall motion abnormalities. There is moderate concentric left ventricular  hypertrophy. Left ventricular  diastolic parameters are indeterminate.   2. Right ventricular systolic function is normal. The right ventricular  size is normal. There is normal pulmonary artery systolic pressure.   3. Left atrial size was mildly dilated.   4. The mitral valve is normal in structure. Mild mitral valve  regurgitation.   5. No vegetation or perivalvular abscess is seen. The aortic valve has  been repaired/replaced. Aortic valve regurgitation is not visualized.  There is a 25 mm Carpentier bioprosthetic valve present in the aortic  position. Procedure Date: 2011. Echo  findings are consistent with stenosis of the aortic prosthesis. Aortic  valve mean gradient measures 46.5 mmHg. Aortic valve Vmax measures 4.38  m/s. Aortic valve acceleration  time measures 90 msec.   6. There is moderate dilatation of the ascending aorta, measuring 47 mm.   Patient Profile     75 y.o. male with a hx of LL, pancytopenia, pulmonary emboli on warfarin, bioprosthetic aortic valve replacement in 2011 who is being seen 07/10/2021 for the evaluation of endocarditis   Assessment & Plan     S/p bioprosthetic AV: He underwent bioprosthetic AVR in July 2011 for severe AI at [facility].  Echo 08/2019 showed increased gradients through aortic valve up to 25 mmHg.  CT showed HALT/HAM, was started on warfarin.  Echo 12/15/2019 showed mean gradients 39 mmHg.  Echo 12/13/2020 showed mean gradient 28 mmHg, DI 0.24, AVA 1.2 cm.  Echocardiogram on 8/30 shows EF 55 to 60%, normal RV function, no vegetation or perivalvular abscess seen, aortic valve mean gradient measures 47 mmHg, V-max 4.4 m/s, acceleration time 90 ms.  Compared to echo 12/13/2020, has higher gradients but unchanged dimensionless index and calculated effective valve area, which along with low acceleration time, suggest increased gradients to high output state in setting of chronic aortic prosthesis mismatch/stenosis.   -Recommend TEE to exclude endocarditis, planned for today   Fevers/Staph bacteremia: Blood cultures on 8/7 growing staph hominis.  ID consulted.    AKI on CKD: Baseline creatinine 1.5, was up to 2.0 on admission.  Has improved to baseline   CLL: Has pancytopenia.  Platelet count 78 today, has been stable   History of recurrent DVT/PE: On warfarin, has been held and starting Lovenox injections.  Hold Lovenox for TEE this morning given thrombocytopenia.  Can restart Lovenox after TEE to bridge until INR therapeutic.   PAF: in sinus rhythm.  On warfarin.  On Lovenox as above   Hypertension: On hydrochlorothiazide.  Appears controlled  ADDENDDUM: TEE negative for vegetation.  Improvement in AV gradients seen, suggesting higher gradients seen on prior TTE were due to high output state.  OK for  discharge from cardiac standpoint.  Can discharge with lovenox to bridge until INR therapeutic. Scheduled in coumadin clinic on 9/6 and has f/u with Dr [name] on 9/8.  For questions or updates, please contact [place] Please consult [email or web address] for contact info under  Signed, Donato Heinz, MD  07/13/2021, 11:55 AM

## 2021-07-13 NOTE — CV Procedure (Signed)
TRANSESOPHAGEAL ECHOCARDIOGRAM (TEE) NOTE  INDICATIONS: bioprosthetic valve infective endocarditis  PROCEDURE:   Informed consent was obtained prior to the procedure. The risks, benefits and alternatives for the procedure were discussed and the patient comprehended these risks.  Risks include, but are not limited to, cough, sore throat, vomiting, nausea, somnolence, esophageal and stomach trauma or perforation, bleeding, low blood pressure, aspiration, pneumonia, infection, trauma to the teeth and death.    After a procedural time-out, the patient was given propofol per anesthesia for sedation.  The patient's heart rate, blood pressure, and oxygen saturation are monitored continuously during the procedure.The oropharynx was anesthetized with topical cetacaine.  The transesophageal probe was inserted in the esophagus and stomach without difficulty and multiple views were obtained.  The patient was kept under observation until the patient left the procedure room.  I was present face-to-face 100% of this time. The patient left the procedure room in stable condition.   Agitated microbubble saline contrast was not administered.  COMPLICATIONS:    There were no immediate complications.  Findings:  LEFT VENTRICLE: The left ventricular wall thickness is normal.  The left ventricular cavity is normal in size. Wall motion is normal.  LVEF is 55-60%.  RIGHT VENTRICLE:  The right ventricle is normal in structure and function without any thrombus or masses.    LEFT ATRIUM:  The left atrium is mildly dilated in size without any thrombus or masses.  There is not spontaneous echo contrast ("smoke") in the left atrium consistent with a low flow state.  LEFT ATRIAL APPENDAGE:  The left atrial appendage is free of any thrombus or masses. The appendage has single lobes. Pulse doppler indicates moderate flow in the appendage.  ATRIAL SEPTUM:  The atrial septum appears intact and is free of thrombus and/or  masses.  There is no evidence for interatrial shunting by color doppler and saline microbubble.  RIGHT ATRIUM:  The right atrium is normal in size and function without any thrombus or masses.  MITRAL VALVE:  The mitral valve is abnormal in structure with mild leaflet thickening and Mild regurgitation.  There were no vegetations or stenosis.  AORTIC VALVE:  The aortic valve is a bioprosthetic valve, specifically a 25 mm Carpentier valve that is trileaflet, with apparent fusion of the leaflet bases, moderate stenosis and  trivial  regurgitation.  There were no vegetations. AVA by planimetry was 1.37 cm2 - additional calculations will follow in the formal report.  TRICUSPID VALVE:  The tricuspid valve is normal in structure and function with  trivial  regurgitation.  There were no vegetations or stenosis   PULMONIC VALVE:  The pulmonic valve is normal in structure and function with  trivial  regurgitation.  There were no vegetations or stenosis.   AORTIC ARCH, ASCENDING AND DESCENDING AORTA:  There was no Ron Parker et. Al, 1992) atherosclerosis of the ascending aorta, aortic arch, or proximal descending aorta. The aortic root is borderline dilated at 38 mm.  12. PULMONARY VEINS: Anomalous pulmonary venous return was not noted.  13. PERICARDIUM: The pericardium appeared normal and non-thickened.  There is no pericardial effusion.  IMPRESSION:   Moderate bioprosthetic aortic valve stenosis without evidence of endocarditis - this appears to be due to leaflet thickening and fusion at the bases with otherwise normal excursion of the medial to leaflet tip portions LVEF 55-60%  RECOMMENDATIONS:    Suspect the prior report of severe bioprosthetic stenosis was based on high gradients due to increased LVOT flow - gradients are now  lower that the valve is clearly no more than moderately stenotic. Give the age of the valve - would continue to monitor for progressive stenosis.  Time Spent Directly with the  Patient:  60 minutes   Pixie Casino, MD, [facility], [place] Director of the Advanced Lipid Disorders &  Cardiovascular Risk Reduction Clinic Diplomate of the American Board of Clinical Lipidology Attending Cardiologist  Direct Dial: [phone]  Fax: [phone]  Website:  [email or web address] 07/13/2021, 1:49 PM

## 2021-07-13 NOTE — Progress Notes (Signed)
So far, Mr. Hamade is doing okay.  He had a very nice response to the Neupogen.  He had 1 dose.  His white cell count went up nicely.  There is starting to trend back downward.  Today, his white cell count is 3.5.  He has had no temperatures.  He is off antibiotics.  Infectious Disease thinks the Staph in his blood might be a contaminant.  He goes for the TEE today.  He says he feels good.  He has had no nausea or vomiting.  He has had no cough.  He has had no diarrhea.  He is eating okay.  There is no bleeding.  His labs show white cell count of 3.5.  Hemoglobin 10.6.  Platelet count 78,000.  I am going to hold his treatment for the CLL now.  He has had a really really nice response by his latest bone marrow.  I really cannot have him become neutropenic.  I just think that he has significant risk of issues with the neutropenia.  Again, his white cell count is dropping a little bit.  Will be interesting to see what it is tomorrow. May have to give him another dose of Neupogen and we find his white cell count dropping significantly.  His vital signs are stable.  Temperature 98.7.  Pulse 60.  Blood pressure 110/68.  Oxygen saturation is 98%.  His lungs are clear bilaterally.  He has good air movement bilaterally.  Cardiac exam regular rate and rhythm.  He may have an occasional extra beat.  I really cannot hear any obvious murmurs.  Abdomen is soft.  Bowel sounds are present.  Again, Mr. Schutt goes for the TEE today.  Will see what his CBC is tomorrow.  Hopefully, he will be able to go home over the weekend.  I know that he really wants to go home so he be with his dog.  I do appreciate the outstanding care that he is got from the hard-working staff up on [address].  Lattie Haw, MD  Colossians 3:17

## 2021-07-13 NOTE — Anesthesia Preprocedure Evaluation (Signed)
Anesthesia Evaluation  Patient identified by MRN, date of birth, ID band Patient awake    Reviewed: Allergy & Precautions, NPO status , Patient's Chart, lab work & pertinent test results, reviewed documented beta blocker date and time   History of Anesthesia Complications Negative for: history of anesthetic complications  Airway Mallampati: II  TM Distance: >3 FB Neck ROM: Full    Dental  (+) Dental Advisory Given, Teeth Intact, Caps,    Pulmonary pneumonia, resolved, former smoker, PE (x2, now on [facility] therapy)   breath sounds clear to auscultation       Cardiovascular Exercise Tolerance: Good hypertension, Pt. on medications + dysrhythmias Atrial Fibrillation + Valvular Problems/Murmurs  Rhythm:Regular Rate:Normal   S/p AVR   '19 TTE - mild concentric LVH. EF 60% to 65%. Bioprosthetic stented AVR with stable gradients since 2018 no peri valvular regurgitation. Mild MR. LA moderately dilated.    Neuro/Psych  Headaches, PSYCHIATRIC DISORDERS Anxiety    GI/Hepatic Neg liver ROS, GERD  Controlled and Medicated,  Endo/Other  negative endocrine ROS  Renal/GU Renal InsufficiencyRenal disease   Hx/o prostate Ca S/P RT    Musculoskeletal  (+) Arthritis , Osteoarthritis,    Abdominal   Peds  Hematology  (+) anemia , CLL Bacteremia   Anesthesia Other Findings   Reproductive/Obstetrics                             Anesthesia Physical  Anesthesia Plan  ASA: 3  Anesthesia Plan: MAC   Post-op Pain Management:    Induction: Intravenous  PONV Risk Score and Plan: 1 and Treatment may vary due to age or medical condition and Ondansetron  Airway Management Planned: Natural Airway and Simple Face Mask  Additional Equipment: None  Intra-op Plan:   Post-operative Plan: Extubation in OR  Informed Consent: I have reviewed the patients History and Physical, chart, labs and discussed the procedure  including the risks, benefits and alternatives for the proposed anesthesia with the patient or authorized representative who has indicated his/her understanding and acceptance.     Dental advisory given  Plan Discussed with: CRNA and Anesthesiologist  Anesthesia Plan Comments:         Anesthesia Quick Evaluation

## 2021-07-13 NOTE — Progress Notes (Signed)
Echocardiogram Echocardiogram Transesophageal has been performed.  Oneal Deputy Sandralee Tarkington RDCS 07/13/2021, 2:02 PM

## 2021-07-13 NOTE — Progress Notes (Signed)
[facility]  PROGRESS NOTE  [name] [id number] DOB: [birth year] DOA: 07/09/2021 PCP: [name], MD   Brief HPI:   75 year old male with history of CLL, pancytopenia, pulmonary embolus due to travel now on chronic warfarin therapy, history of aortic valve replacement in 2011 presented to ED on 8/29 with fever and positive blood culture. Patient's original issue was that he had a nosebleed with and chills with fever 101 at home on 07/07/21; he did present to the emergency department, had cultures drawn, and was started on oral antibiotics. He was told to come back to the emergency department 07/08/21 due to finding of a positive blood culture; he came to the emergency department and improved and was sent home on oral levofloxacin. Around 2 am on 07/09/21 he developed a cold sweat, chills, then burning; temperature was 103 at home. He took Tylenol at home. ED physician discussed the case with the pharmacist who recommended Ancef, which was started in the emergency department.   Subjective   Patient seen and examined, plan for TEE today.   Assessment/Plan:     Staph hominis bacteremia -1 set of blood cultures drawn on 8/27 revealed staph hominis -Repeat blood cultures drawn on 8/29 is currently pending -ID has been consulted; IV fluids that this could be contaminant or true positive and culture -TEE scheduled for 07-13-21  Right-sided pneumonia -Started on Augmentin for 5-day course  Acute kidney injury on CKD stage III -Patient baseline creatinine 1.5 -Creatinine back to baseline  CLL/pancytopenia -Currently on therapy with Venetoclax -Followed by [name] -Received Neupogen in the hospital  Hypertension -Continue HCTZ  Depression/anxiety -Continue Zoloft, Ativan as needed  Hyperlipidemia -Continue Crestor    Scheduled medications:    [MAR Hold] amoxicillin-clavulanate  1 tablet Oral Q12H   [MAR Hold] cholecalciferol  2,000 Units Oral q morning    [MAR Hold] enoxaparin (LOVENOX) injection  1 mg/kg Subcutaneous Q12H   [MAR Hold] hydrochlorothiazide  12.5 mg Oral Daily   [MAR Hold] meclizine  25 mg Oral q morning   [MAR Hold] omeprazole  40 mg Oral QPM   [MAR Hold] rosuvastatin  10 mg Oral QHS   [MAR Hold] sertraline  100 mg Oral q morning         Data Reviewed:   CBG:  No results for input(s): GLUCAP in the last 168 hours.  SpO2: 99 %    Vitals:   07/12/21 1946 07/13/21 0445 07/13/21 1148 07/13/21 1234  BP: 119/72 110/68 96/66 127/78  Pulse: 64 60 67 63  Resp: '20 20 20 16  '$ Temp: 98.4 F (36.9 C) 98.7 F (37.1 C)  97.7 F (36.5 C)  TempSrc: Oral Oral Oral Temporal  SpO2: 97% 98% 98% 99%  Weight:  89.6 kg  89.6 kg  Height:    '5\' 10"'$  (1.778 m)    No intake or output data in the 24 hours ending 07/13/21 1343   08/31 1901 - 09/02 0700 In: 120 [P.O.:120] Out: -   Filed Weights   07/11/21 0405 07/13/21 0445 07/13/21 1234  Weight: 89.7 kg 89.6 kg 89.6 kg    CBC:  Recent Labs  Lab 07/07/21 0605 07/09/21 0708 07/10/21 0521 07/11/21 0529 07/12/21 0815 07/13/21 0521  WBC 2.6* 1.9* 1.8* 6.8 5.2 3.5*  HGB 11.5* 11.1* 11.0* 10.9* 10.4* 10.6*  HCT 34.1* 33.7* 34.0* 32.7* 30.9* 31.2*  PLT 102* 76* 77* 69* 68* 78*  MCV 95.8 96.0 99.1 96.5 95.7 94.5  MCH 32.3 31.6 32.1 32.2 32.2  32.1  MCHC 33.7 32.9 32.4 33.3 33.7 34.0  RDW 13.4 13.5 13.7 13.5 13.5 13.3  LYMPHSABS 0.6* 0.3*  --  0.3* 0.5* 0.6*  MONOABS 0.7 0.5  --  0.4 0.3 0.4  EOSABS 0.0 0.0  --  0.0 0.0 0.1  BASOSABS 0.0 0.0  --  0.0 0.0 0.0    Complete metabolic panel:  Recent Labs  Lab 07/07/21 0605 07/09/21 0708 07/09/21 0900 07/10/21 0521 07/10/21 1100 07/11/21 0529 07/12/21 0540 07/13/21 0521  NA 135 134*  --  135  --  139 134* 136  K 3.9 3.7  --  3.6  --  3.8 3.6 3.5  CL 102 100  --  102  --  103 102 103  CO2 25 25  --  25  --  '28 25 24  '$ GLUCOSE 99 114*  --  105*  --  103* 98 88  BUN 22 28*  --  22  --  '21 17 20  '$ CREATININE 1.53*  1.99*  --  1.47*  --  1.49* 1.26* 1.06  CALCIUM 8.7* 8.8*  --  8.8*  --  9.1 8.8* 9.1  AST 22  --   --   --   --   --   --  27  ALT 16  --   --   --   --   --   --  18  ALKPHOS 55  --   --   --   --   --   --  61  BILITOT 0.6  --   --   --   --   --   --  0.5  ALBUMIN 3.7  --   --   --   --   --   --  3.2*  CRP  --   --   --   --   --  15.8*  --   --   PROCALCITON <0.10 0.15  --  0.15  --  0.15  --   --   LATICACIDVEN 0.5  --  0.8  --   --   --   --   --   INR 2.8* 2.8*  --   --  2.3* 1.9*  --  1.2    No results for input(s): LIPASE, AMYLASE in the last 168 hours.  Recent Labs  Lab 07/07/21 0605 07/09/21 0708 07/09/21 0729 07/10/21 0521 07/11/21 0529  CRP  --   --   --   --  15.8*  PROCALCITON <0.10 0.15  --  0.15 0.15  SARSCOV2NAA NEGATIVE  --  NEGATIVE  --   --     ------------------------------------------------------------------------------------------------------------------ No results for input(s): CHOL, HDL, LDLCALC, TRIG, CHOLHDL, LDLDIRECT in the last 72 hours.  Lab Results  Component Value Date   HGBA1C 5.7 11/29/2015   ------------------------------------------------------------------------------------------------------------------ No results for input(s): TSH, T4TOTAL, T3FREE, THYROIDAB in the last 72 hours.  Invalid input(s): FREET3 ------------------------------------------------------------------------------------------------------------------ Recent Labs    07/11/21 0529  FERRITIN 365*    Coagulation profile Recent Labs  Lab 07/07/21 0605 07/09/21 0708 07/10/21 1100 07/11/21 0529 07/13/21 0521  INR 2.8* 2.8* 2.3* 1.9* 1.2   No results for input(s): DDIMER in the last 72 hours.  Cardiac Enzymes Recent Labs  Lab 07/11/21 0529  CKTOTAL 73    ------------------------------------------------------------------------------------------------------------------ No results found for: BNP   Antibiotics: Anti-infectives (From admission, onward)     Start     Dose/Rate Route Frequency Ordered Stop   07/11/21 1200  [  MAR Hold]  amoxicillin-clavulanate (AUGMENTIN) 875-125 MG per tablet 1 tablet        (MAR Hold since Fri 07/13/2021 at 1213.Hold Reason: Transfer to a Procedural area)   1 tablet Oral Every 12 hours 07/11/21 1105 07/16/21 0959   07/09/21 1800  vancomycin (VANCOREADY) IVPB 1750 mg/350 mL  Status:  Discontinued        1,750 mg 175 mL/hr over 120 Minutes Intravenous  Once 07/09/21 1704 07/09/21 1728   07/09/21 0715  ceFAZolin (ANCEF) IVPB 2g/100 mL premix  Status:  Discontinued        2 g 200 mL/hr over 30 Minutes Intravenous Every 8 hours 07/09/21 0708 07/10/21 1229        Radiology Reports  No results found.    DVT prophylaxis: Coumadin  Code Status: Full code  Family Communication: No family at bedside   Consultants:   Procedures:     Objective    Physical Examination:  General-appears in no acute distress Heart-S1-S2, regular, no murmur auscultated Lungs-clear to auscultation bilaterally, no wheezing or crackles auscultated Abdomen-soft, nontender, no organomegaly Extremities-no edema in the lower extremities Neuro-alert, oriented x3, no focal deficit noted  Status is: Inpatient  Dispo: The patient is from: Home              Anticipated d/c is to: Home              Anticipated d/c date is: 07/15/2021              Patient currently not stable for discharge  Barrier to discharge-ongoing evaluation for bacteremia retrocaval  COVID-19 Labs  Recent Labs    07/11/21 0529  FERRITIN 365*  LDH 310*  CRP 15.8*    Lab Results  Component Value Date   SARSCOV2NAA NEGATIVE 07/09/2021   [place] NEGATIVE 07/07/2021   [place] NEGATIVE 06/18/2021   [place] NEGATIVE 05/29/2021    Microbiology  Recent Results (from the past 240 hour(s))  Blood Culture (routine x 2)     Status: Abnormal   Collection Time: 07/07/21  6:05 AM   Specimen: BLOOD  Result Value Ref Range Status    Specimen Description   Final    BLOOD BLOOD RIGHT FOREARM Performed at [facility], [address] [place], [place] [address]    Special Requests   Final    BOTTLES DRAWN AEROBIC AND ANAEROBIC Blood Culture adequate volume Performed at [facility], [address] [place], [place] [address]    Culture  Setup Time   Final    GRAM POSITIVE COCCI IN CLUSTERS ANAEROBIC BOTTLE ONLY CRITICAL RESULT CALLED TO, READ BACK BY AND VERIFIED WITH: Mickie Hillier RN '@1007'$  07/08/21 EB Performed at [facility], [address], [place], [place] [address]    Culture STAPHYLOCOCCUS HOMINIS (A)  Final   Report Status 07/11/2021 FINAL  Final   Organism ID, Bacteria STAPHYLOCOCCUS HOMINIS  Final      Susceptibility   Staphylococcus hominis - MIC*    CIPROFLOXACIN <=0.5 SENSITIVE Sensitive     ERYTHROMYCIN <=0.25 SENSITIVE Sensitive     GENTAMICIN <=0.5 SENSITIVE Sensitive     OXACILLIN RESISTANT Resistant     TETRACYCLINE <=1 SENSITIVE Sensitive     VANCOMYCIN <=0.5 SENSITIVE Sensitive     TRIMETH/SULFA 20 SENSITIVE Sensitive     CLINDAMYCIN <=0.25 SENSITIVE Sensitive     RIFAMPIN <=0.5 SENSITIVE Sensitive     Inducible Clindamycin NEGATIVE Sensitive     * STAPHYLOCOCCUS HOMINIS  Resp Panel  by RT-PCR (Flu A&B, Covid) Peripheral     Status: None   Collection Time: 07/07/21  6:05 AM   Specimen: Peripheral; Nasopharyngeal(NP) swabs in vial transport medium  Result Value Ref Range Status   SARS Coronavirus 2 by RT PCR NEGATIVE NEGATIVE Final    Comment: (NOTE) SARS-CoV-2 target nucleic acids are NOT DETECTED.  The SARS-CoV-2 RNA is generally detectable in upper respiratory specimens during the acute phase of infection. The lowest concentration of SARS-CoV-2 viral copies this assay can detect is 138 copies/mL. A negative result does not preclude SARS-Cov-2 infection and should not be used as the sole basis for treatment or other patient management  decisions. A negative result may occur with  improper specimen collection/handling, submission of specimen other than nasopharyngeal swab, presence of viral mutation(s) within the areas targeted by this assay, and inadequate number of viral copies(<138 copies/mL). A negative result must be combined with clinical observations, patient history, and epidemiological information. The expected result is Negative.  Fact Sheet for Patients:  EntrepreneurPulse.com.au  Fact Sheet for Healthcare Providers:  IncredibleEmployment.be  This test is no t yet approved or cleared by the [place] FDA and  has been authorized for detection and/or diagnosis of SARS-CoV-2 by FDA under an Emergency Use Authorization (EUA). This EUA will remain  in effect (meaning this test can be used) for the duration of the COVID-19 declaration under Section 564(b)(1) of the Act, 21 U.S.C.section 360bbb-3(b)(1), unless the authorization is terminated  or revoked sooner.       Influenza A by PCR NEGATIVE NEGATIVE Final   Influenza B by PCR NEGATIVE NEGATIVE Final    Comment: (NOTE) The Xpert Xpress SARS-CoV-2/FLU/RSV plus assay is intended as an aid in the diagnosis of influenza from Nasopharyngeal swab specimens and should not be used as a sole basis for treatment. Nasal washings and aspirates are unacceptable for Xpert Xpress SARS-CoV-2/FLU/RSV testing.  Fact Sheet for Patients: EntrepreneurPulse.com.au  Fact Sheet for Healthcare Providers: IncredibleEmployment.be  This test is not yet approved or cleared by the [place] FDA and has been authorized for detection and/or diagnosis of SARS-CoV-2 by FDA under an Emergency Use Authorization (EUA). This EUA will remain in effect (meaning this test can be used) for the duration of the COVID-19 declaration under Section 564(b)(1) of the Act, 21 U.S.C. section 360bbb-3(b)(1), unless the  authorization is terminated or revoked.  Performed at [facility], [address], [place], [place] [address]   Blood Culture ID Panel (Reflexed)     Status: Abnormal   Collection Time: 07/07/21  6:05 AM  Result Value Ref Range Status   Enterococcus faecalis NOT DETECTED NOT DETECTED Final   Enterococcus Faecium NOT DETECTED NOT DETECTED Final   Listeria monocytogenes NOT DETECTED NOT DETECTED Final   Staphylococcus species DETECTED (A) NOT DETECTED Final    Comment: CRITICAL RESULT CALLED TO, READ BACK BY AND VERIFIED WITH: B[place] RN '@1007'$  07/08/21 EB    Staphylococcus aureus (BCID) NOT DETECTED NOT DETECTED Final   Staphylococcus epidermidis NOT DETECTED NOT DETECTED Final   Staphylococcus lugdunensis NOT DETECTED NOT DETECTED Final   Streptococcus species NOT DETECTED NOT DETECTED Final   Streptococcus agalactiae NOT DETECTED NOT DETECTED Final   Streptococcus pneumoniae NOT DETECTED NOT DETECTED Final   Streptococcus pyogenes NOT DETECTED NOT DETECTED Final   A.calcoaceticus-baumannii NOT DETECTED NOT DETECTED Final   Bacteroides fragilis NOT DETECTED NOT DETECTED Final   Enterobacterales NOT DETECTED NOT DETECTED Final   Enterobacter cloacae complex NOT DETECTED  NOT DETECTED Final   Escherichia coli NOT DETECTED NOT DETECTED Final   Klebsiella aerogenes NOT DETECTED NOT DETECTED Final   Klebsiella oxytoca NOT DETECTED NOT DETECTED Final   Klebsiella pneumoniae NOT DETECTED NOT DETECTED Final   Proteus species NOT DETECTED NOT DETECTED Final   Salmonella species NOT DETECTED NOT DETECTED Final   Serratia marcescens NOT DETECTED NOT DETECTED Final   Haemophilus influenzae NOT DETECTED NOT DETECTED Final   Neisseria meningitidis NOT DETECTED NOT DETECTED Final   Pseudomonas aeruginosa NOT DETECTED NOT DETECTED Final   Stenotrophomonas maltophilia NOT DETECTED NOT DETECTED Final   Candida albicans NOT DETECTED NOT DETECTED Final   Candida auris NOT  DETECTED NOT DETECTED Final   Candida glabrata NOT DETECTED NOT DETECTED Final   Candida krusei NOT DETECTED NOT DETECTED Final   Candida parapsilosis NOT DETECTED NOT DETECTED Final   Candida tropicalis NOT DETECTED NOT DETECTED Final   Cryptococcus neoformans/gattii NOT DETECTED NOT DETECTED Final    Comment: Performed at [facility], [place] [address], [place], [place] [address]  MRSA Next Gen by PCR, Nasal     Status: None   Collection Time: 07/08/21  3:27 PM   Specimen: Nasal Mucosa; Nasal Swab  Result Value Ref Range Status   MRSA by PCR Next Gen NOT DETECTED NOT DETECTED Final    Comment: (NOTE) The GeneXpert MRSA Assay (FDA approved for NASAL specimens only), is one component of a comprehensive MRSA colonization surveillance program. It is not intended to diagnose MRSA infection nor to guide or monitor treatment for MRSA infections. Test performance is not FDA approved in patients less than 54 years old. Performed at [facility], [place] [address], [place], [place] [address]   Urine Culture     Status: None   Collection Time: 07/09/21  6:48 AM   Specimen: Urine, Clean Catch  Result Value Ref Range Status   Specimen Description   Final    URINE, CLEAN CATCH Performed at [facility], [address] [place], [place] [address]    Special Requests   Final    NONE Performed at [facility], [address], [place], [place] [address]    Culture   Final    NO GROWTH Performed at [facility], [place] [address] [place], [place] [address]    Report Status 07/10/2021 FINAL  Final  Blood culture (routine x 2)     Status: None (Preliminary result)   Collection Time: 07/09/21  7:08 AM   Specimen: BLOOD  Result Value Ref Range Status   Specimen Description   Final    BLOOD BLOOD RIGHT FOREARM Performed at [facility], [address], [place], [place] [address]    Special Requests   Final     BOTTLES DRAWN AEROBIC AND ANAEROBIC Blood Culture adequate volume Performed at [facility], [address], [place], [place] [address]    Culture   Final    NO GROWTH 4 DAYS Performed at [facility], [place] [address], [place], [place] [address]    Report Status PENDING  Incomplete  Resp Panel by RT-PCR (Flu A&B, Covid) Nasopharyngeal Swab     Status: None   Collection Time: 07/09/21  7:29 AM   Specimen: Nasopharyngeal Swab; Nasopharyngeal(NP) swabs in vial transport medium  Result Value Ref Range Status   SARS Coronavirus 2 by RT PCR NEGATIVE NEGATIVE Final    Comment: (NOTE) SARS-CoV-2 target nucleic acids are NOT DETECTED.  The SARS-CoV-2 RNA is generally detectable in upper respiratory specimens during the acute phase of infection. The lowest concentration of SARS-CoV-2 viral copies this assay can detect is 138 copies/mL. A negative result does not preclude SARS-Cov-2 infection and should not be used as the sole basis for treatment or other patient management decisions. A negative result may occur with  improper specimen collection/handling, submission of specimen other than nasopharyngeal swab, presence of viral mutation(s) within the areas targeted by this assay, and inadequate number of viral copies(<138 copies/mL). A negative result must be combined with clinical observations, patient history, and epidemiological information. The expected result is Negative.  Fact Sheet for Patients:  EntrepreneurPulse.com.au  Fact Sheet for Healthcare Providers:  IncredibleEmployment.be  This test is no t yet approved or cleared by the [place] FDA and  has been authorized for detection and/or diagnosis of SARS-CoV-2 by FDA under an Emergency Use Authorization (EUA). This EUA will remain  in effect (meaning this test can be used) for the duration of the COVID-19 declaration under Section 564(b)(1) of the Act,  21 U.S.C.section 360bbb-3(b)(1), unless the authorization is terminated  or revoked sooner.       Influenza A by PCR NEGATIVE NEGATIVE Final   Influenza B by PCR NEGATIVE NEGATIVE Final    Comment: (NOTE) The Xpert Xpress SARS-CoV-2/FLU/RSV plus assay is intended as an aid in the diagnosis of influenza from Nasopharyngeal swab specimens and should not be used as a sole basis for treatment. Nasal washings and aspirates are unacceptable for Xpert Xpress SARS-CoV-2/FLU/RSV testing.  Fact Sheet for Patients: EntrepreneurPulse.com.au  Fact Sheet for Healthcare Providers: IncredibleEmployment.be  This test is not yet approved or cleared by the [place] FDA and has been authorized for detection and/or diagnosis of SARS-CoV-2 by FDA under an Emergency Use Authorization (EUA). This EUA will remain in effect (meaning this test can be used) for the duration of the COVID-19 declaration under Section 564(b)(1) of the Act, 21 U.S.C. section 360bbb-3(b)(1), unless the authorization is terminated or revoked.  Performed at [facility], [address], [place], [place] [address]   Blood culture (routine x 2)     Status: None (Preliminary result)   Collection Time: 07/09/21  7:30 AM   Specimen: BLOOD LEFT HAND  Result Value Ref Range Status   Specimen Description   Final    BLOOD LEFT HAND Performed at [facility], [address], [place], [place] [address]    Special Requests   Final    Blood Culture adequate volume Performed at [facility], [address], [place], [place] [address]    Culture   Final    NO GROWTH 4 DAYS Performed at [facility], [place] [address], [place], [place] [address]    Report Status PENDING  Incomplete  Culture, blood (Routine X 2) w Reflex to ID Panel     Status: None (Preliminary result)   Collection Time: 07/11/21  9:15 AM   Specimen: BLOOD  Result  Value Ref Range Status   Specimen Description   Final    BLOOD RIGHT ANTECUBITAL Performed at [facility] [address], [place], [place] [address]    Special Requests   Final    BOTTLES DRAWN AEROBIC ONLY Blood Culture results may not be optimal due to an inadequate volume of blood received in culture bottles Performed at [facility] [address], [place], [place] [address]    Culture   Final  NO GROWTH 2 DAYS Performed at [facility], [place] [address] [place], [place] [address]    Report Status PENDING  Incomplete  Culture, blood (Routine X 2) w Reflex to ID Panel     Status: None (Preliminary result)   Collection Time: 07/11/21  9:15 AM   Specimen: BLOOD  Result Value Ref Range Status   Specimen Description   Final    BLOOD BLOOD LEFT HAND Performed at [facility] [address], [place], [place] [address]    Special Requests   Final    BOTTLES DRAWN AEROBIC AND ANAEROBIC Blood Culture adequate volume Performed at [facility] [address], [place], [place] [address]    Culture   Final    NO GROWTH 2 DAYS Performed at [facility] [address] [place], [place] [address]    Report Status PENDING  Incomplete             Oswald Hillock   [facility] If 7PM-7AM, please contact night-coverage at [email or web address], Office  [phone]   07/13/2021, 1:43 PM  LOS: 4 days

## 2021-07-13 NOTE — Progress Notes (Signed)
Gave pt discharge instructions, additional teaching completed on Lovenox administration. Pt states that he has taken Lovenox in the past and verbalizes how to give injection. Pt states that he has a friend who gave him the shots previously and she will be able to help again. Per Dr. Darrick Meigs pt does not need to take any dose before leaving hospital. Pt is aware that he is to start Lovenox tonight. Pt voices complete understanding, all questions answered.

## 2021-07-14 LAB — CULTURE, BLOOD (ROUTINE X 2)
Culture: NO GROWTH
Culture: NO GROWTH
Special Requests: ADEQUATE
Special Requests: ADEQUATE

## 2021-07-15 LAB — CRYOGLOBULIN

## 2021-07-16 ENCOUNTER — Encounter (HOSPITAL_COMMUNITY): Payer: Self-pay | Admitting: Internal Medicine

## 2021-07-16 LAB — CULTURE, BLOOD (ROUTINE X 2)
Culture: NO GROWTH
Culture: NO GROWTH
Special Requests: ADEQUATE

## 2021-07-16 NOTE — Progress Notes (Signed)
Virtual Visit via Video Note   This visit type was conducted due to national recommendations for restrictions regarding the COVID-19 Pandemic (e.g. social distancing) in an effort to limit this patient's exposure and mitigate transmission in our community.  Due to her co-morbid illnesses, this patient is at least at moderate risk for complications without adequate follow up.  This format is felt to be most appropriate for this patient at this time.  All issues noted in this document were discussed and addressed.  A limited physical exam was performed with this format.  Please refer to the patient's chart for her consent to telehealth for [facility].   Date:  07/19/2021   ID:  [name], DOB [birth year], MRN [id number]  Provider Location: Office Patient Location: Home  PCP:  [name] Majestic, MD  Cardiologist:   Eden Emms Electrophysiologist:  None   Evaluation Performed:  Follow-Up Visit  Chief Complaint:  AVR PAF   History of Present Illness:    75 y.o. with bioprosthetic AVR [name] at [facility] July 2011 for severe AR No CAD. History of PAF and recurrent DVT/PE on chronic anticoagulation  Also had LUE cephalic/basilic thrombus. CRF;s include HTN and HLD. Has CLL followed by [name] [name] post chemoRx. Also had radical prostatectomy for cancer In 2016 had RLL wedge resection for granulomatous benign nodule Has had sleep issues with nightmares and we have since stopped his beta blocker including lopressor and atenolol He is seen at [facility] for PTSD and uses Zoloft   Had right foot reconstruction 03/02/19 involving the right gastrocnemius tendon for foot drop  Echo 08/27/19 showed increased gradients on his 25 mm CE bioprosthesis with CT showing HALT/HAM started on coumadin instead of xarelto Concern related to progression of his CLL and marantic. On coumadin gradients improved somewhat with mean 39-30 mmHg peak 65-55 mmHg by TTE 12/15/19 Echo 12/13/20 stable with mean gradient 27.5 and peak 46.2  mmHg DVI 0.24 AVA 1.2 cm2   Seen by PA 03/10/20 with dyspnea and palpitations Thought to be anxiety related  Soft tissue CT neck 03/15/20 with moderate bulky cervical lymphadenopathy new/progressed since 2019 Seen by [name] [name] 09/21/20 felt the venetoclax and acalabrutnib doing good job bringing lymphocyte count down from 91% to 51%  F/U CT  10/18/20 with significant improvement He is due to have BM biopsy in August   07/09/21-07/13/21 admitted with nosebleed, fever, chills with staph hominis bacteremia ? 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 Modesto Charon in Ortho. 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 Myna Bell (most recent 07/2017)  History of prostate cancer 2003   Rad prost   History of pulmonary embolism 2011; 10/2014   Recurrence off of anticoagulation 10/2014: per hem/onc ([name]. Marin Bell) pt needs lifelong anticoagulation (hypercoag w/u neg).   Hyperlipidemia    statin-intolerant, except for crestor low dose.   Hypertension    Migraine syndrome    Mitral regurgitation    PAF (paroxysmal atrial fibrillation) (HCC)    Panic attacks    "           "             "                  "                  "                        "                             "                          Pulmonary nodule, right 2015   RLL: resected at [facility] --VATS; Benign RLL nodule (fungal and AFB stains neg).  Plan is for [facility] thoracic surg to do f/u o/v & CT chest 1 yr    Right-sided headache 07/2016   Primary stabbing HA or migraine per neuro ([name]. Tomi Likens).  Gabapentin and topamax not tolerated.  These resolved spontaneously.   Past Surgical History:  Procedure Laterality Date   AORTIC VALVE REPLACEMENT  2011   [place]  (bioprosthetic)   CARDIAC CATHETERIZATION  04/2010   Normal coronaries.   Carotid dopplers  08/2015   NORMAL   CATARACT EXTRACTION     L 12/06/09   R 09/14/09   COLONOSCOPY  01/26/13   tic's, o/w normal.  ([place])--recall 10 yrs.   PENILE PROSTHESIS IMPLANT     PROSTATECTOMY  04/2002   for prostate cancer   Pulmonary nodule resection  Fall/winter 2016   [facility]   SHOULDER SURGERY     rotator cuff on both arms    TEE WITHOUT CARDIOVERSION N/A 07/13/2021   Procedure: TRANSESOPHAGEAL ECHOCARDIOGRAM (TEE);  Surgeon: Pixie Casino, MD;  Location: [facility] ENDOSCOPY;  Service: Cardiovascular;  Laterality: N/A;   TIBIALIS TENDON TRANSFER / REPAIR Right 03/02/2019   Procedure: RECONSTRUCTION OF RIGHT  ANTERIOR TIBIALIS TENDON;  Surgeon: Erle Crocker, MD;  Location: [place];  Service: Orthopedics;  Laterality: Right;   TRANSTHORACIC ECHOCARDIOGRAM  09/20/2014; 03/2016; 03/2017   2015-mild LVH, EF 50-55%, wall motion nl, grade I diast dysfxn, prosth aort valve good,transaortic gradients decreased compared to echo 11/2013.   03/2016: EF 55-60%, normal LV function, severe LVH, normal diast fxn, mild increase in AV gradient compared to 09/2014 echo.  2018: EF 55-60%, normal LV fxn, grd II DD, AV stable/gradient's stable.     Current Meds  Medication Sig   [employer] MOTION SICKNESS RELIEF 25 MG CHEW CHEW 1 TABLET EVERY DAY AS NEEDED FOR DIZZINESS (Patient taking differently: Chew 25 mg by mouth every  morning. meclizine)   hydrochlorothiazide (HYDRODIURIL) 12.5 MG tablet TAKE 1 TABLET BY MOUTH EVERY DAY (Patient taking differently: Take 12.5 mg by mouth every morning.)   ipratropium (ATROVENT) 0.03 % nasal spray Place 2 sprays into both nostrils every 12 (twelve) hours. (  Patient taking differently: Place 2 sprays into both nostrils 2 (two) times daily as needed for rhinitis (congestion).)     Allergies:   Hydrocodone-acetaminophen, Oxycodone hcl, Telmisartan-hctz, Ramipril, Aspirin, Atorvastatin, Buspirone hcl, Buspirone hcl, Codeine, Morphine sulfate, Paroxetine, and Rabeprazole   Social History   Tobacco Use   Smoking status: Former    Packs/day: 1.00    Years: 12.00    Pack years: 12.00    Types: Cigarettes    Start date: 11/28/1968    Quit date: 01/29/1981    Years since quitting: 40.4   Smokeless tobacco: Never   Tobacco comments:    quit 75 yo   Vaping Use   Vaping Use: Never used  Substance Use Topics   Alcohol use: Not Currently    Comment: occasional   Drug use: No     Family Hx: The patient's family history includes Cancer in his sister; Colon cancer (age of onset: 42) in his sister; Prostate cancer in his father; Stroke in his brother, mother, and sister. There is no history of Heart attack.  ROS:   Please see the history of present illness.     All other systems reviewed and are negative.   Prior CV studies:   The following studies were reviewed today:  Echo 04/08/18  EF 60-65% mild MR moderate LAE stable mean gradient through AVR 18 mmHg peak 31 mmHg DVI 0.32   Labs/Other Tests and Data Reviewed:    EKG:  07/19/2021 SR rate 76 RBBB/LAFB    Recent Labs: 07/13/2021: ALT 18; BUN 20; Creatinine, Ser 1.06; Hemoglobin 10.6; Platelets 78; Potassium 3.5; Sodium 136   Recent Lipid Panel Lab Results  Component Value Date/Time   CHOL 170 05/18/2021 02:27 PM   TRIG 238.0 (H) 05/18/2021 02:27 PM   HDL 35.80 (L) 05/18/2021 02:27 PM   CHOLHDL 5 05/18/2021 02:27 PM    LDLCALC 110 (H) 05/12/2020 08:40 AM   LDLDIRECT 95.0 05/18/2021 02:27 PM    Wt Readings from Last 3 Encounters:  07/19/21 87.1 kg  07/13/21 89.6 kg  07/09/21 87.5 kg     Objective:    Vital Signs:  BP 105/70   Pulse 93   Ht $R'5\' 10"'tY$  (1.778 m)   Wt 87.1 kg   BMI 27.55 kg/m    No distress No tachypnea No edema   ASSESSMENT & PLAN:    AVR:  2011 25 mm CE. Marked increase in gradients on echo 08/27/19 with cardiac CT showeing HALT/HAM despite being on xarelto. Changed to coumadin. TEE 07/13/21 stable no SBE    History of DVT (deep vein thrombosis) and Recurrent pulmonary embolism Continue coumadin . This is managed by heme/onc.    Paroxysmal atrial fibrillation Maintaining NSR.  Continue  Coumadin     Essential hypertension Borderline control.  Continue to monitor.   HLD (hyperlipidemia) Has had leg pain with statins in past. Taking crestor 3 x/week    Lung nodule  [facility] VATS at [facility]  Recent CT see above f/u [facility]/Ennever    Carotid Bruit:  Left duplex no stenosis f/u 08/2019    Depression:  Better off paroxetine now on low dose prozac with tofranil   Headache:  F/u primary PRN tylenol ok has schrap metal on right side of head Cant have MRI   ZSW:FUXN with primary typically Cr around 1.25 07/13/21    BBB:  RBBB LAD post AVR ECG q 6 months follow for more advanced AV block    CLL:  F/u with [name]  doing well on Calquence and Venetoclax  Had a Neupogen shot while in hospital    Insomnia:  Improved off beta blocker and on zoloft   ENT:  Hoarseness ? Related to CLL f/u ENT   Fever/Sepsis:  recent d/c from [facility] 07/13/21 Rx with Augmentin 5 days Right sided pneumonia ? Bacteremia with contaminant staph hominis    Medication Adjustments/Labs and Tests Ordered: Current medicines are reviewed at length with the patient today.  Concerns regarding medicines are outlined above.   Tests Ordered:  None   Medication Changes:  None   Disposition:  F/u with me in 6  months   Time:  reviewing chart recent hospitalization TEE images direct patient interview and composing note 22 minutes   Signed, Jenkins Rouge, MD  07/19/2021 8:33 AM    [place]

## 2021-07-17 ENCOUNTER — Ambulatory Visit (INDEPENDENT_AMBULATORY_CARE_PROVIDER_SITE_OTHER): Payer: Medicare Other | Admitting: *Deleted

## 2021-07-17 ENCOUNTER — Encounter: Payer: Self-pay | Admitting: Family Medicine

## 2021-07-17 ENCOUNTER — Encounter: Payer: Self-pay | Admitting: Hematology & Oncology

## 2021-07-17 ENCOUNTER — Other Ambulatory Visit: Payer: Self-pay

## 2021-07-17 DIAGNOSIS — I2699 Other pulmonary embolism without acute cor pulmonale: Secondary | ICD-10-CM

## 2021-07-17 DIAGNOSIS — I480 Paroxysmal atrial fibrillation: Secondary | ICD-10-CM

## 2021-07-17 DIAGNOSIS — Z952 Presence of prosthetic heart valve: Secondary | ICD-10-CM | POA: Diagnosis not present

## 2021-07-17 DIAGNOSIS — Z5181 Encounter for therapeutic drug level monitoring: Secondary | ICD-10-CM

## 2021-07-17 LAB — POCT INR: INR: 1.1 — AB (ref 2.0–3.0)

## 2021-07-17 MED ORDER — ENOXAPARIN SODIUM 80 MG/0.8ML IJ SOSY: 80.0000 mg | PREFILLED_SYRINGE | Freq: Two times a day (BID) | INTRAMUSCULAR | 0 refills | Status: DC

## 2021-07-17 NOTE — Patient Instructions (Signed)
Description    Continue Lovenox Injections twice a day. Take warfarin 2 tablets today and tomorrow, then continue to take warfarin 1.5 tablets daily except for 1 tablet on Mondays. Recheck INR on Monday 9/12.

## 2021-07-18 ENCOUNTER — Telehealth: Payer: Self-pay

## 2021-07-18 LAB — EHRLICHIA ANTIBODY PANEL
E chaffeensis (HGE) Ab, IgG: NEGATIVE
E chaffeensis (HGE) Ab, IgM: NEGATIVE
E. Chaffeensis (HME) IgM Titer: NEGATIVE
E.Chaffeensis (HME) IgG: NEGATIVE

## 2021-07-18 LAB — ANTINUCLEAR ANTIBODIES, IFA: ANA Ab, IFA: NEGATIVE

## 2021-07-18 NOTE — Telephone Encounter (Signed)
LVM for patient to call back. ?

## 2021-07-18 NOTE — Telephone Encounter (Signed)
Pt called regarding a hospital follow up. Discharge papers say that he needs to be seen in a week. Dr Yong Channel does not have a 40 minute slot for the rest of September. Where should we schedule [name]? Please Advise.

## 2021-07-19 ENCOUNTER — Other Ambulatory Visit: Payer: Self-pay

## 2021-07-19 ENCOUNTER — Telehealth (INDEPENDENT_AMBULATORY_CARE_PROVIDER_SITE_OTHER): Payer: No Typology Code available for payment source | Admitting: Cardiovascular Disease

## 2021-07-19 VITALS — BP 105/70 | HR 93 | Ht 70.0 in | Wt 192.0 lb

## 2021-07-19 DIAGNOSIS — Z952 Presence of prosthetic heart valve: Secondary | ICD-10-CM

## 2021-07-19 DIAGNOSIS — C9110 Chronic lymphocytic leukemia of B-cell type not having achieved remission: Secondary | ICD-10-CM

## 2021-07-19 DIAGNOSIS — I4510 Unspecified right bundle-branch block: Secondary | ICD-10-CM

## 2021-07-19 DIAGNOSIS — I480 Paroxysmal atrial fibrillation: Secondary | ICD-10-CM | POA: Diagnosis not present

## 2021-07-19 NOTE — Patient Instructions (Signed)
Medication Instructions:  Your physician recommends that you continue on your current medications as directed. Please refer to the Current Medication list given to you today.  *If you need a refill on your cardiac medications before your next appointment, please call your pharmacy*  Lab Work: If you have labs (blood work) drawn today and your tests are completely normal, you will receive your results only by: [place] (if you have MyChart) OR A paper copy in the mail If you have any lab test that is abnormal or we need to change your treatment, we will call you to review the results.  Follow-Up: At [facility], you and your health needs are our priority.  As part of our continuing mission to provide you with exceptional heart care, we have created designated Provider Care Teams.  These Care Teams include your primary Cardiologist (physician) and Advanced Practice Providers (APPs -  Physician Assistants and Nurse Practitioners) who all work together to provide you with the care you need, when you need it.  We recommend signing up for the patient portal called "MyChart".  Sign up information is provided on this After Visit Summary.  MyChart is used to connect with patients for Virtual Visits (Telemedicine).  Patients are able to view lab/test results, encounter notes, upcoming appointments, etc.  Non-urgent messages can be sent to your provider as well.   To learn more about what you can do with MyChart, go to [email or web address].    Your next appointment:   6 month(s)  The format for your next appointment:   In Person  Provider:   You may see Jenkins Rouge, MD or one of the following Advanced Practice Providers on your designated Care Team:   Cecilie Kicks, NP

## 2021-07-19 NOTE — Telephone Encounter (Signed)
See below

## 2021-07-22 NOTE — Telephone Encounter (Signed)
Wednesday 11 40 AM but please tell him I will be running behind most likely as working him in

## 2021-07-23 ENCOUNTER — Other Ambulatory Visit: Payer: Self-pay

## 2021-07-23 ENCOUNTER — Ambulatory Visit (INDEPENDENT_AMBULATORY_CARE_PROVIDER_SITE_OTHER): Payer: No Typology Code available for payment source

## 2021-07-23 DIAGNOSIS — Z5181 Encounter for therapeutic drug level monitoring: Secondary | ICD-10-CM | POA: Diagnosis not present

## 2021-07-23 DIAGNOSIS — Z952 Presence of prosthetic heart valve: Secondary | ICD-10-CM

## 2021-07-23 DIAGNOSIS — I480 Paroxysmal atrial fibrillation: Secondary | ICD-10-CM

## 2021-07-23 DIAGNOSIS — I2699 Other pulmonary embolism without acute cor pulmonale: Secondary | ICD-10-CM

## 2021-07-23 LAB — POCT INR: INR: 1.9 — AB (ref 2.0–3.0)

## 2021-07-23 NOTE — Telephone Encounter (Signed)
See below

## 2021-07-23 NOTE — Telephone Encounter (Signed)
Pt is scheduled °

## 2021-07-23 NOTE — Patient Instructions (Signed)
Description    Take your last Lovenox Injection this evening. Take warfarin 2 tablets today, then resume same dosage 1.5 tablets daily except for 1 tablet on Mondays. Recheck INR in 2 weeks.  Original lovenox order: Lovenox '100mg'$  ('90mg'$  BID) per order. Pt could d/c LOVENOX when INR was greater then 2.

## 2021-07-23 NOTE — Progress Notes (Signed)
Phone [phone] In person visit   Subjective:   [name] is a 75 y.o. year old very pleasant male patient who presents for/with See problem oriented charting Chief Complaint  Patient presents with   Hospitalization Follow-up    Pt was seen in ED on 07/09/2021 and admitted through 07/13/2021.  His complaints were nosebleeds and fever w/ chills. Pt says that he is doing a lot better. He denies nosebleeds, fever/chills.   Bacteremia   Neutropenic    This visit occurred during the SARS-CoV-2 public health emergency.  Safety protocols were in place, including screening questions prior to the visit, additional usage of staff PPE, and extensive cleaning of exam room while observing appropriate contact time as indicated for disinfecting solutions.   Past Medical History-  Patient Active Problem List   Diagnosis Date Noted   S/P Bioprosthetic AVR (aortic valve replacement) in 2011 03/22/2015    Priority: High   Recurrent pulmonary embolism ([employer]) 11/09/2014    Priority: High   Chronic lymphocytic leukemia ([place]) 11/11/2013    Priority: High   Atrial fibrillation ([place]) 04/20/2007    Priority: High   PROSTATE CANCER, HX OF 04/20/2007    Priority: High   Aortic atherosclerosis ([place]) 07/25/2021    Priority: Medium   PTSD (post-traumatic stress disorder) 09/29/2018    Priority: Medium   BPPV (benign paroxysmal positional vertigo) 09/04/2017    Priority: Medium   CKD (chronic kidney disease), stage III ([place]) 08/11/2017    Priority: Medium   Cervical spondylosis     Priority: Medium   Migraine 10/08/2010    Priority: Medium   Insomnia 02/03/2008    Priority: Medium   Anxiety state 09/06/2007    Priority: Medium   HLD (hyperlipidemia) 04/20/2007    Priority: Medium   Essential hypertension 04/20/2007    Priority: Medium   Former smoker 08/29/2016    Priority: Low   Lung nodule 11/02/2014    Priority: Low   GERD 12/11/2009    Priority: Low   MITRAL REGURGITATION  04/07/2009    Priority: Low   Pneumonia of right lower lobe due to infectious organism    Neutropenic fever (HCC)    FUO (fever of unknown origin)    Pancytopenia ([place])    Positive blood culture 07/09/2021   Anticoagulants causing adverse effect in therapeutic use 06/22/2021   Encounter for therapeutic drug monitoring 09/21/2019   History of pulmonary embolism 03/07/2015    Medications- reviewed and updated Current Outpatient Medications  Medication Sig Dispense Refill   Cholecalciferol (VITAMIN D3) 50 MCG (2000 UT) capsule Take 2,000 Units by mouth every morning.     CVS MOTION SICKNESS RELIEF 25 MG CHEW CHEW 1 TABLET EVERY DAY AS NEEDED FOR DIZZINESS (Patient taking differently: Chew 25 mg by mouth every morning. meclizine) 32 tablet 2   docusate sodium (COLACE) 100 MG capsule Take 100 mg by mouth daily as needed (constipation).     hydrochlorothiazide (HYDRODIURIL) 12.5 MG tablet TAKE 1 TABLET BY MOUTH EVERY DAY (Patient taking differently: Take 12.5 mg by mouth every morning.) 90 tablet 1   ipratropium (ATROVENT) 0.03 % nasal spray Place 2 sprays into both nostrils every 12 (twelve) hours. (Patient taking differently: Place 2 sprays into both nostrils 2 (two) times daily as needed for rhinitis (congestion).) 30 mL 12   LORazepam (ATIVAN) 0.5 MG tablet TAKE 1 TABLET BY MOUTH EVERY DAY AS NEEDED FOR ANXIETY (Patient taking differently: Take 0.125 mg by mouth at bedtime as needed for  anxiety. 1/4 tablet - 0.125 mg) 30 tablet 2   omeprazole (PRILOSEC) 40 MG capsule Take 40 mg by mouth every evening.     rosuvastatin (CRESTOR) 10 MG tablet TAKE 1 TABLET BY MOUTH EVERY DAY (Patient taking differently: Take 10 mg by mouth at bedtime.) 90 tablet 3   sertraline (ZOLOFT) 100 MG tablet Take 100 mg by mouth every morning.     venetoclax (VENCLEXTA) 100 MG tablet Take 1 tablet (100 mg total) by mouth daily. Tablets should be swallowed whole with a meal and a full glass of water. (Patient taking  differently: Take 100 mg by mouth at bedtime. Tablets should be swallowed whole with a meal and a full glass of water.) 30 tablet 4   warfarin (COUMADIN) 5 MG tablet TAKE AS DIRECTED BY COUMADIN CLINIC (Patient taking differently: Take 5-7.5 mg by mouth See admin instructions. Take 1 tablet (5 mg) by mouth on Monday evenings, take 1 1/2 tablets (7.5 mg) on all other evenings of the week) 160 tablet 1   No current facility-administered medications for this visit.     Objective:  BP 124/78   Pulse 72   Temp 98 F (36.7 C) (Temporal)   Ht '5\' 10"'$  (1.778 m)   Wt 194 lb 12.8 oz (88.4 kg)   SpO2 97%   BMI 27.95 kg/m  Gen: NAD, resting comfortably Cervical and supraclavicular lymphadenopathy CV: Regular heart rate-stable murmur Lungs: CTAB no crackles, wheeze, rhonchi Ext: no edema Skin: warm, dry     Assessment and Plan   # Hospital F/U for pneumonia S:Patient was admitted in hospital 07/09/21 - 07/13/21 with fever and positive blood culture-ultimately blood culture was thought to be contaminant- from hospital notes "Staph hominis bacteremia -1 set of blood cultures drawn on 8/27 revealed staph hominis -Repeat blood cultures drawn on 8/29 is currently pending -ID has been consulted; IV fluids that this could be contaminant or true positive and culture -TEE today shows no vegetation. -As per ID this is likely contamination, will not require treatment.".    He was found to have pneumonia on chest x-ray and CT-initially treated with Ancef and discharged with 3 additional days of Augmentin.  From CT of the chest on 07/10/2021 " IMPRESSION: Increased opacity is seen involving the superior segment of right lower lobe concerning for focal pneumonia or other inflammation.   Mildly enlarged mediastinal, supraclavicular and periaortic adenopathy is noted concerning for malignancy given the patient's history of chronic lymphocytic leukemia.   Aortic Atherosclerosis (ICD10-I70.0)."   No  significant findings other than findings above on abdomen pelvis CT  Today patient reports- - no cough or congestion. Fevers have resolved since most recent hospitalization.  Finished antibiotics. Neuopgen in hospital and felt much better after this. Labs tomorrow with [name].   A/P: Thankfully no bacteremia was ultimately found.  Also 34-year-old no vegetations on valves on echocardiogram.  Patient has completed treatment with Augmentin for pneumonia with no residual fever.  Patient states he has felt much better since Neupogen injection-discussed updating CBC and CMP today but he declined and wants to do this with [name] tomorrow.  [name] was okay with flu shot today per secure messaging and this was given to patient  #CLL-patient is currently off venetoclax and is concerned about CLL progression-on the other hand there is concerned about recurrent neutropenic fever.  He was given Neupogen while off venetoclax-he wants to discuss with [name] if it is possible to get Neupogen while on venetoclax-I  told patient I was unsure on this topic.  Thankfully has follow-up tomorrow and can be discussed  #hyperlipidemia #Aortic atherosclerosis-discussed new diagnosis today S: Medication:rosuvastatin 10 mg daily  Lab Results  Component Value Date   CHOL 170 05/18/2021   HDL 35.80 (L) 05/18/2021   LDLCALC 110 (H) 05/12/2020   LDLDIRECT 95.0 05/18/2021   TRIG 238.0 (H) 05/18/2021   CHOLHDL 5 05/18/2021   A/P: Mild poor control of hyperlipidemia in light of new diagnosis of aortic atherosclerosis-discussed potentially pushing LDL under 70-with recent medical issues patient would prefer to hold off on any adjustments at this time-we will monitor  # Atrial fibrillation-follows with cardiology #Recurrent pulmonary embolism-on chronic Coumadin #Status post bioprosthetic AVR S: Rate controlled with your medication - warfarin 5 mg  Anticoagulated with Coumadin-changed to this by cardiology  due to thrombus on AVR A/P: Anticoagulated with Coumadin-had some adjustments with INR slightly being low this week-this is treatment both for atrial fibrillation and for recurrent PE.  Thankfully aortic valve was stable with no vegetations on echocardiogram recently-TEE.  Does have moderate aortic stenosis  #hypertension S: medication: Hydrochlorothiazide 12.5 mg daily BP Readings from Last 3 Encounters:  07/25/21 124/78  07/19/21 105/70  07/13/21 (!) 118/95  A/P:  Controlled. Continue current medications.    # Anxiety S:Medication: Zoloft 100 mg daily and Ativan 0.5 mg as needed - presents as spinning in his head.  Surprisingly with recent medical issues has not had significant worsening in anxiety  A/P: Patient reports reasonable control lately-continue current medications  Recommended follow up: Keep January visit Future Appointments  Date Time Provider [place]  07/26/2021  1:45 PM [facility] None  07/26/2021  2:15 PM Ennever, Rudell Cobb, MD [facility] None  08/06/2021 11:45 AM [facility]  08/09/2021  1:00 PM Oren Binet, PhD [facility] None  11/19/2021  1:00 PM [name], MD [facility]  01/11/2022 10:00 AM Josue Hector, MD [facility]  01/21/2022  1:00 PM [facility] HEALTH COACH [facility]    Lab/Order associations:   ICD-10-CM   1. Essential hypertension  I10     2. Stage 3 chronic kidney disease, unspecified whether stage 3a or 3b CKD (HCC)  N18.30     3. Atrial fibrillation, unspecified type ([place])  I48.91     4. Hyperlipidemia, unspecified hyperlipidemia type  E78.5     5. Gastroesophageal reflux disease without esophagitis  K21.9     6. Anxiety state  F41.1     7. Chronic lymphocytic leukemia (HCC)  C91.10     8. Aortic atherosclerosis (HCC)  I70.0     9. Need for immunization against influenza  Z23 Flu Vaccine QUAD High Dose(Fluad)     I,Jada Bradford,acting as a scribe for  Garret Reddish, MD.,have documented all relevant documentation on the behalf of Garret Reddish, MD,as directed by  Garret Reddish, MD while in the presence of Garret Reddish, MD.   I, Garret Reddish, MD, have reviewed all documentation for this visit. The documentation on 07/25/21 for the exam, diagnosis, procedures, and orders are all accurate and complete.   Return precautions advised.  Garret Reddish, MD

## 2021-07-25 ENCOUNTER — Ambulatory Visit (INDEPENDENT_AMBULATORY_CARE_PROVIDER_SITE_OTHER): Payer: No Typology Code available for payment source | Admitting: Family Medicine

## 2021-07-25 ENCOUNTER — Other Ambulatory Visit: Payer: Self-pay

## 2021-07-25 ENCOUNTER — Encounter: Payer: Self-pay | Admitting: Family Medicine

## 2021-07-25 VITALS — BP 124/78 | HR 72 | Temp 98.0°F | Ht 70.0 in | Wt 194.8 lb

## 2021-07-25 DIAGNOSIS — N183 Chronic kidney disease, stage 3 unspecified: Secondary | ICD-10-CM

## 2021-07-25 DIAGNOSIS — Z23 Encounter for immunization: Secondary | ICD-10-CM

## 2021-07-25 DIAGNOSIS — I700 Atherosclerosis of aorta: Secondary | ICD-10-CM

## 2021-07-25 DIAGNOSIS — E785 Hyperlipidemia, unspecified: Secondary | ICD-10-CM

## 2021-07-25 DIAGNOSIS — I10 Essential (primary) hypertension: Secondary | ICD-10-CM | POA: Diagnosis not present

## 2021-07-25 DIAGNOSIS — C9110 Chronic lymphocytic leukemia of B-cell type not having achieved remission: Secondary | ICD-10-CM

## 2021-07-25 DIAGNOSIS — I4891 Unspecified atrial fibrillation: Secondary | ICD-10-CM

## 2021-07-25 DIAGNOSIS — F411 Generalized anxiety disorder: Secondary | ICD-10-CM

## 2021-07-25 DIAGNOSIS — K219 Gastro-esophageal reflux disease without esophagitis: Secondary | ICD-10-CM

## 2021-07-25 NOTE — Patient Instructions (Addendum)
Health Maintenance Due  Topic Date Due   Zoster Vaccines- Shingrix (2 of 2)  - Hold off shingles shot at this time.   04/15/2021   INFLUENZA VACCINE   - High-dose shot today in office.   06/11/2021   Glad you are doing/feeling better!   Recommended follow up: keep January visit unless you need [name] sooner

## 2021-07-26 ENCOUNTER — Inpatient Hospital Stay (HOSPITAL_BASED_OUTPATIENT_CLINIC_OR_DEPARTMENT_OTHER): Payer: Medicare Other | Admitting: Hematology & Oncology

## 2021-07-26 ENCOUNTER — Encounter: Payer: Self-pay | Admitting: Hematology & Oncology

## 2021-07-26 ENCOUNTER — Inpatient Hospital Stay: Payer: Medicare Other | Attending: Hematology & Oncology

## 2021-07-26 VITALS — BP 119/80 | HR 78 | Temp 98.8°F | Resp 18 | Wt 194.8 lb

## 2021-07-26 DIAGNOSIS — Z86711 Personal history of pulmonary embolism: Secondary | ICD-10-CM | POA: Diagnosis not present

## 2021-07-26 DIAGNOSIS — R918 Other nonspecific abnormal finding of lung field: Secondary | ICD-10-CM | POA: Insufficient documentation

## 2021-07-26 DIAGNOSIS — Z79899 Other long term (current) drug therapy: Secondary | ICD-10-CM | POA: Diagnosis not present

## 2021-07-26 DIAGNOSIS — T451X5A Adverse effect of antineoplastic and immunosuppressive drugs, initial encounter: Secondary | ICD-10-CM | POA: Diagnosis not present

## 2021-07-26 DIAGNOSIS — C9110 Chronic lymphocytic leukemia of B-cell type not having achieved remission: Secondary | ICD-10-CM | POA: Insufficient documentation

## 2021-07-26 DIAGNOSIS — D701 Agranulocytosis secondary to cancer chemotherapy: Secondary | ICD-10-CM | POA: Diagnosis not present

## 2021-07-26 DIAGNOSIS — Z5189 Encounter for other specified aftercare: Secondary | ICD-10-CM | POA: Diagnosis not present

## 2021-07-26 DIAGNOSIS — Z7901 Long term (current) use of anticoagulants: Secondary | ICD-10-CM | POA: Diagnosis not present

## 2021-07-26 LAB — CBC WITH DIFFERENTIAL (CANCER CENTER ONLY)
Abs Immature Granulocytes: 0.02 10*3/uL (ref 0.00–0.07)
Basophils Absolute: 0 10*3/uL (ref 0.0–0.1)
Basophils Relative: 1 %
Eosinophils Absolute: 0.1 10*3/uL (ref 0.0–0.5)
Eosinophils Relative: 3 %
HCT: 35.5 % — ABNORMAL LOW (ref 39.0–52.0)
Hemoglobin: 11.5 g/dL — ABNORMAL LOW (ref 13.0–17.0)
Immature Granulocytes: 1 %
Lymphocytes Relative: 28 %
Lymphs Abs: 0.6 10*3/uL — ABNORMAL LOW (ref 0.7–4.0)
MCH: 31.4 pg (ref 26.0–34.0)
MCHC: 32.4 g/dL (ref 30.0–36.0)
MCV: 97 fL (ref 80.0–100.0)
Monocytes Absolute: 0.6 10*3/uL (ref 0.1–1.0)
Monocytes Relative: 29 %
Neutro Abs: 0.8 10*3/uL — ABNORMAL LOW (ref 1.7–7.7)
Neutrophils Relative %: 38 %
Platelet Count: 130 10*3/uL — ABNORMAL LOW (ref 150–400)
RBC: 3.66 MIL/uL — ABNORMAL LOW (ref 4.22–5.81)
RDW: 13.4 % (ref 11.5–15.5)
WBC Count: 2 10*3/uL — ABNORMAL LOW (ref 4.0–10.5)
nRBC: 0 % (ref 0.0–0.2)

## 2021-07-26 LAB — CMP (CANCER CENTER ONLY)
ALT: 29 U/L (ref 0–44)
AST: 21 U/L (ref 15–41)
Albumin: 4.2 g/dL (ref 3.5–5.0)
Alkaline Phosphatase: 74 U/L (ref 38–126)
Anion gap: 9 (ref 5–15)
BUN: 25 mg/dL — ABNORMAL HIGH (ref 8–23)
CO2: 28 mmol/L (ref 22–32)
Calcium: 9.5 mg/dL (ref 8.9–10.3)
Chloride: 100 mmol/L (ref 98–111)
Creatinine: 1.58 mg/dL — ABNORMAL HIGH (ref 0.61–1.24)
GFR, Estimated: 45 mL/min — ABNORMAL LOW (ref >60)
Glucose, Bld: 90 mg/dL (ref 70–99)
Potassium: 4.3 mmol/L (ref 3.5–5.1)
Sodium: 137 mmol/L (ref 135–145)
Total Bilirubin: 0.5 mg/dL (ref 0.3–1.2)
Total Protein: 6.3 g/dL — ABNORMAL LOW (ref 6.5–8.1)

## 2021-07-26 LAB — LACTATE DEHYDROGENASE: LDH: 260 U/L — ABNORMAL HIGH (ref 98–192)

## 2021-07-26 LAB — SAVE SMEAR(SSMR), FOR PROVIDER SLIDE REVIEW

## 2021-07-26 NOTE — Progress Notes (Signed)
Hematology and Oncology Follow Up Visit  Ravis Pingitore Wiers OJ:[id number] 1946-07-10 75 y.o. 07/26/2021   Principle Diagnosis:  Right lower lobe pulmonary nodule- non-malignant Recurrent pulmonary embolism CLL -- progressive -- 11q-/13q-  Current Therapy:   Coumadin 5 mg po q day Calquence 100 mg po BID -- start on 03/13/2020 -- d/c on 07/05/2021 Venetoclax 100 mg po q day -- change 07/05/2021     Interim History:  Mr.  Vradenburg is back for follow-up.  He was recently in the hospital.  He had Staph bacteremia.  Thankfully, this did not see any heart valves.  He had a TEE which did not show anything like this.  The problem was that he was quite neutropenic.  I am sure this is from his acalabrutinib/venetoclax.  I have off treatment right now.  I think we can keep him off treatment.  His last bone marrow looked quite good.  Had very, very little monoclonal lymphocytes.  His goal is to fly over to [place] in October.  I really do not see a problem with him flying over to [place] at that time.  He has had no problems with appetite.  He has had no problems with swallowing.  There is no change in bowel or bladder habits.  Currently, I would have to say that his performance status is ECOG 1.      Medications:  Current Outpatient Medications:    Cholecalciferol (VITAMIN D3) 50 MCG (2000 UT) capsule, Take 2,000 Units by mouth every morning., Disp: , Rfl:    [employer] MOTION SICKNESS RELIEF 25 MG CHEW, CHEW 1 TABLET EVERY DAY AS NEEDED FOR DIZZINESS (Patient taking differently: Chew 25 mg by mouth every morning. meclizine), Disp: 32 tablet, Rfl: 2   docusate sodium (COLACE) 100 MG capsule, Take 100 mg by mouth daily as needed (constipation)., Disp: , Rfl:    hydrochlorothiazide (HYDRODIURIL) 12.5 MG tablet, TAKE 1 TABLET BY MOUTH EVERY DAY (Patient taking differently: Take 12.5 mg by mouth every morning.), Disp: 90 tablet, Rfl: 1   ipratropium (ATROVENT) 0.03 % nasal spray, Place 2 sprays into both nostrils  every 12 (twelve) hours. (Patient taking differently: Place 2 sprays into both nostrils 2 (two) times daily as needed for rhinitis (congestion).), Disp: 30 mL, Rfl: 12   LORazepam (ATIVAN) 0.5 MG tablet, TAKE 1 TABLET BY MOUTH EVERY DAY AS NEEDED FOR ANXIETY (Patient taking differently: Take 0.125 mg by mouth at bedtime as needed for anxiety. 1/4 tablet - 0.125 mg), Disp: 30 tablet, Rfl: 2   omeprazole (PRILOSEC) 40 MG capsule, Take 40 mg by mouth every evening., Disp: , Rfl:    rosuvastatin (CRESTOR) 10 MG tablet, TAKE 1 TABLET BY MOUTH EVERY DAY (Patient taking differently: Take 10 mg by mouth at bedtime.), Disp: 90 tablet, Rfl: 3   sertraline (ZOLOFT) 100 MG tablet, Take 100 mg by mouth every morning., Disp: , Rfl:    venetoclax (VENCLEXTA) 100 MG tablet, Take 1 tablet (100 mg total) by mouth daily. Tablets should be swallowed whole with a meal and a full glass of water. (Patient taking differently: Take 100 mg by mouth at bedtime. Tablets should be swallowed whole with a meal and a full glass of water.), Disp: 30 tablet, Rfl: 4   warfarin (COUMADIN) 5 MG tablet, TAKE AS DIRECTED BY COUMADIN CLINIC (Patient taking differently: Take 5-7.5 mg by mouth See admin instructions. Take 1 tablet (5 mg) by mouth on Monday evenings, take 1 1/2 tablets (7.5 mg) on all other evenings of  the week), Disp: 160 tablet, Rfl: 1  Allergies:  Allergies  Allergen Reactions   Hydrocodone-Acetaminophen Shortness Of Breath   Oxycodone Hcl Shortness Of Breath   Telmisartan-Hctz Shortness Of Breath, Palpitations and Other (See Comments)    Dizziness also   Ramipril Other (See Comments)    Unknown reaction   Aspirin Rash and Other (See Comments)    Confusion also Cannot take high dose   Atorvastatin Other (See Comments)    myalgia   Buspirone Hcl Other (See Comments)    headache   Buspirone Hcl Other (See Comments)    headache   Codeine Hives, Nausea And Vomiting and Other (See Comments)    Confusion also    Morphine Sulfate Itching   Paroxetine Other (See Comments)    Paresthesias: R arm, R leg, r side of face   Rabeprazole Other (See Comments)    headache    Past Medical History, Surgical history, Social history, and Family History were reviewed and updated.  Review of Systems: Review of Systems  Constitutional: Negative.   HENT: Negative.    Eyes: Negative.   Respiratory: Negative.    Cardiovascular: Negative.   Gastrointestinal: Negative.   Genitourinary: Negative.   Musculoskeletal: Negative.   Skin: Negative.   Neurological: Negative.   Endo/Heme/Allergies: Negative.   Psychiatric/Behavioral: Negative.     Physical Exam:  weight is 194 lb 12.8 oz (88.4 kg). His oral temperature is 98.8 F (37.1 C). His blood pressure is 119/80 and his pulse is 78. His respiration is 18 and oxygen saturation is 98%.   Physical Exam Vitals reviewed.  Constitutional:      Comments: Axillary exam does not show any palpable adenopathy in either axilla. Marland Kitchen    HENT:     Head: Normocephalic and atraumatic.  Eyes:     Pupils: Pupils are equal, round, and reactive to light.  Neck:     Comments: On his neck exam, he has had nice regression of his lymphadenopathy.  I cannot palpate any adenopathy in the cervical or supraclavicular chains. Cardiovascular:     Rate and Rhythm: Normal rate and regular rhythm.     Heart sounds: Normal heart sounds.  Pulmonary:     Effort: Pulmonary effort is normal.     Breath sounds: Normal breath sounds.  Abdominal:     General: Bowel sounds are normal.     Palpations: Abdomen is soft.  Musculoskeletal:        General: No tenderness or deformity. Normal range of motion.     Cervical back: Normal range of motion.  Lymphadenopathy:     Cervical: No cervical adenopathy.  Skin:    General: Skin is warm and dry.     Findings: No erythema or rash.  Neurological:     Mental Status: He is alert and oriented to person, place, and time.  Psychiatric:         Behavior: Behavior normal.        Thought Content: Thought content normal.        Judgment: Judgment normal.    Lab Results  Component Value Date   WBC 2.0 (L) 07/26/2021   HGB 11.5 (L) 07/26/2021   HCT 35.5 (L) 07/26/2021   MCV 97.0 07/26/2021   PLT 130 (L) 07/26/2021     Chemistry      Component Value Date/Time   NA 137 07/26/2021 1333   NA 145 07/22/2017 0756   NA 139 06/27/2016 1410   K 4.3 07/26/2021 1333  K 4.3 07/22/2017 0756   K 5.1 06/27/2016 1410   CL 100 07/26/2021 1333   CL 105 07/22/2017 0756   CO2 28 07/26/2021 1333   CO2 30 07/22/2017 0756   CO2 28 06/27/2016 1410   BUN 25 (H) 07/26/2021 1333   BUN 17 07/22/2017 0756   BUN 20.9 06/27/2016 1410   CREATININE 1.58 (H) 07/26/2021 1333   CREATININE 1.6 (H) 07/22/2017 0756   CREATININE 1.6 (H) 06/27/2016 1410      Component Value Date/Time   CALCIUM 9.5 07/26/2021 1333   CALCIUM 9.6 07/22/2017 0756   CALCIUM 9.6 06/27/2016 1410   ALKPHOS 74 07/26/2021 1333   ALKPHOS 59 07/22/2017 0756   ALKPHOS 62 06/27/2016 1410   AST 21 07/26/2021 1333   AST 18 06/27/2016 1410   ALT 29 07/26/2021 1333   ALT 24 07/22/2017 0756   ALT 24 06/27/2016 1410   BILITOT 0.5 07/26/2021 1333   BILITOT 0.46 06/27/2016 1410      Impression and Plan: Mr. Schlimgen is 75 year-old African-American gentleman.  He has recurrent CLL.    I really hate that he was in the hospital.  Again, he certainly has a sensitivity to the combination of the acalabrutinib and the venetoclax.  I think with how good the bone marrow looked, we can just watch him right now.  I think that his white cell count started to come up.  He has a high monocyte percentage.  This typically indicates that the marrow is starting to recover.  I am going to get him back in a little less than a month.  I would like to see him back before he goes over to [place].  I want to make sure that his blood counts have improved.     Marland KitchenVolanda Napoleon, MD 9/15/20222:25 PM

## 2021-07-27 ENCOUNTER — Telehealth: Payer: Self-pay

## 2021-07-27 ENCOUNTER — Encounter (HOSPITAL_COMMUNITY): Payer: Self-pay | Admitting: Emergency Medicine

## 2021-07-27 ENCOUNTER — Other Ambulatory Visit: Payer: Self-pay

## 2021-07-27 ENCOUNTER — Emergency Department (HOSPITAL_COMMUNITY)
Admission: EM | Admit: 2021-07-27 | Discharge: 2021-07-27 | Disposition: A | Discharge status: Home / Self Care | Payer: No Typology Code available for payment source | Attending: Emergency Medicine | Admitting: Emergency Medicine

## 2021-07-27 DIAGNOSIS — R509 Fever, unspecified: Secondary | ICD-10-CM | POA: Diagnosis not present

## 2021-07-27 DIAGNOSIS — R519 Headache, unspecified: Secondary | ICD-10-CM | POA: Diagnosis not present

## 2021-07-27 DIAGNOSIS — J00 Acute nasopharyngitis [common cold]: Secondary | ICD-10-CM | POA: Diagnosis not present

## 2021-07-27 DIAGNOSIS — Z5321 Procedure and treatment not carried out due to patient leaving prior to being seen by health care provider: Secondary | ICD-10-CM | POA: Insufficient documentation

## 2021-07-27 LAB — CBC WITH DIFFERENTIAL/PLATELET
Abs Immature Granulocytes: 0.02 10*3/uL (ref 0.00–0.07)
Basophils Absolute: 0 10*3/uL (ref 0.0–0.1)
Basophils Relative: 1 %
Eosinophils Absolute: 0 10*3/uL (ref 0.0–0.5)
Eosinophils Relative: 1 %
HCT: 36 % — ABNORMAL LOW (ref 39.0–52.0)
Hemoglobin: 11.7 g/dL — ABNORMAL LOW (ref 13.0–17.0)
Immature Granulocytes: 1 %
Lymphocytes Relative: 19 %
Lymphs Abs: 0.4 10*3/uL — ABNORMAL LOW (ref 0.7–4.0)
MCH: 31.6 pg (ref 26.0–34.0)
MCHC: 32.5 g/dL (ref 30.0–36.0)
MCV: 97.3 fL (ref 80.0–100.0)
Monocytes Absolute: 0.4 10*3/uL (ref 0.1–1.0)
Monocytes Relative: 18 %
Neutro Abs: 1.2 10*3/uL — ABNORMAL LOW (ref 1.7–7.7)
Neutrophils Relative %: 60 %
Platelets: 123 10*3/uL — ABNORMAL LOW (ref 150–400)
RBC: 3.7 MIL/uL — ABNORMAL LOW (ref 4.22–5.81)
RDW: 13.5 % (ref 11.5–15.5)
WBC: 2 10*3/uL — ABNORMAL LOW (ref 4.0–10.5)
nRBC: 0 % (ref 0.0–0.2)

## 2021-07-27 LAB — COMPREHENSIVE METABOLIC PANEL
ALT: 30 U/L (ref 0–44)
AST: 24 U/L (ref 15–41)
Albumin: 4.3 g/dL (ref 3.5–5.0)
Alkaline Phosphatase: 74 U/L (ref 38–126)
Anion gap: 8 (ref 5–15)
BUN: 30 mg/dL — ABNORMAL HIGH (ref 8–23)
CO2: 26 mmol/L (ref 22–32)
Calcium: 9.4 mg/dL (ref 8.9–10.3)
Chloride: 100 mmol/L (ref 98–111)
Creatinine, Ser: 1.75 mg/dL — ABNORMAL HIGH (ref 0.61–1.24)
GFR, Estimated: 40 mL/min — ABNORMAL LOW (ref >60)
Glucose, Bld: 107 mg/dL — ABNORMAL HIGH (ref 70–99)
Potassium: 4.1 mmol/L (ref 3.5–5.1)
Sodium: 134 mmol/L — ABNORMAL LOW (ref 135–145)
Total Bilirubin: 0.7 mg/dL (ref 0.3–1.2)
Total Protein: 7.1 g/dL (ref 6.5–8.1)

## 2021-07-27 LAB — IGG, IGA, IGM
IgA: 79 mg/dL (ref 61–437)
IgG (Immunoglobin G), Serum: 632 mg/dL (ref 603–1613)
IgM (Immunoglobulin M), Srm: 41 mg/dL (ref 15–143)

## 2021-07-27 LAB — LACTIC ACID, PLASMA: Lactic Acid, Venous: 1.2 mmol/L (ref 0.5–1.9)

## 2021-07-27 NOTE — ED Triage Notes (Signed)
Patient states fever earlier. Checked his temp at home due to being cold. Patient states fever at home 100. Patient states a slight headache and stomach stomach unease. Patient took tylenol around 1730

## 2021-07-27 NOTE — ED Notes (Signed)
Patient stated to registration that he no longer wanted to wait. Handed his stickers to registration, and per registration, patient left department.

## 2021-07-27 NOTE — ED Provider Notes (Signed)
Emergency Medicine Provider Triage Evaluation Note  [name] , a 75 y.o. male  was evaluated in triage.  Pt complains of fever.  Of note, patient has CLL, not actively receiving treatment for same.  Patient was discharged on the 9/2 where he was admitted for sepsis.  Patient states that earlier today he started feeling febrile with chills, took his temperature and it was 100.  Took Tylenol at 6 PM.  Temperature now 98.8.  Also endorses mild headache and abdominal pain.  Review of Systems  Positive: Fevers, chills, headache, abdominal pain Negative:   Physical Exam  There were no vitals taken for this visit. Gen:   Awake, no distress   Resp:  Normal effort  MSK:   Moves extremities without difficulty  Other:  Abdomen nontender to deep palpation  Medical Decision Making  Medically screening exam initiated at 8:45 PM.  Appropriate orders placed.  Shlomy Palacio Riffe was informed that the remainder of the evaluation will be completed by another provider, this initial triage assessment does not replace that evaluation, and the importance of remaining in the ED until their evaluation is complete.     [name] 07/27/21 2051    [name] Lower, MD 07/28/21 [phone]

## 2021-07-29 ENCOUNTER — Encounter: Payer: Self-pay | Admitting: Hematology & Oncology

## 2021-07-29 LAB — PROTEIN ELECTROPHORESIS, SERUM, WITH REFLEX
A/G Ratio: 1.4 (ref 0.7–1.7)
Albumin ELP: 3.6 g/dL (ref 2.9–4.4)
Alpha-1-Globulin: 0.3 g/dL (ref 0.0–0.4)
Alpha-2-Globulin: 0.6 g/dL (ref 0.4–1.0)
Beta Globulin: 1 g/dL (ref 0.7–1.3)
Gamma Globulin: 0.6 g/dL (ref 0.4–1.8)
Globulin, Total: 2.5 g/dL (ref 2.2–3.9)
Total Protein ELP: 6.1 g/dL (ref 6.0–8.5)

## 2021-07-30 ENCOUNTER — Encounter: Payer: Self-pay | Admitting: Family Medicine

## 2021-07-30 ENCOUNTER — Telehealth: Payer: Self-pay

## 2021-07-30 ENCOUNTER — Other Ambulatory Visit: Payer: Self-pay | Admitting: *Deleted

## 2021-07-30 ENCOUNTER — Other Ambulatory Visit: Payer: Self-pay

## 2021-07-30 ENCOUNTER — Inpatient Hospital Stay: Payer: Medicare Other

## 2021-07-30 ENCOUNTER — Other Ambulatory Visit: Payer: Self-pay | Admitting: Pharmacist

## 2021-07-30 VITALS — BP 99/64 | HR 72 | Temp 98.0°F | Resp 17

## 2021-07-30 DIAGNOSIS — C9110 Chronic lymphocytic leukemia of B-cell type not having achieved remission: Secondary | ICD-10-CM

## 2021-07-30 DIAGNOSIS — D709 Neutropenia, unspecified: Secondary | ICD-10-CM

## 2021-07-30 LAB — CBC WITH DIFFERENTIAL (CANCER CENTER ONLY)
Abs Immature Granulocytes: 0.02 10*3/uL (ref 0.00–0.07)
Basophils Absolute: 0 10*3/uL (ref 0.0–0.1)
Basophils Relative: 1 %
Eosinophils Absolute: 0 10*3/uL (ref 0.0–0.5)
Eosinophils Relative: 0 %
HCT: 34.7 % — ABNORMAL LOW (ref 39.0–52.0)
Hemoglobin: 11.4 g/dL — ABNORMAL LOW (ref 13.0–17.0)
Immature Granulocytes: 1 %
Lymphocytes Relative: 24 %
Lymphs Abs: 0.4 10*3/uL — ABNORMAL LOW (ref 0.7–4.0)
MCH: 31.8 pg (ref 26.0–34.0)
MCHC: 32.9 g/dL (ref 30.0–36.0)
MCV: 96.7 fL (ref 80.0–100.0)
Monocytes Absolute: 0.3 10*3/uL (ref 0.1–1.0)
Monocytes Relative: 20 %
Neutro Abs: 0.8 10*3/uL — ABNORMAL LOW (ref 1.7–7.7)
Neutrophils Relative %: 54 %
Platelet Count: 77 10*3/uL — ABNORMAL LOW (ref 150–400)
RBC: 3.59 MIL/uL — ABNORMAL LOW (ref 4.22–5.81)
RDW: 13.2 % (ref 11.5–15.5)
WBC Count: 1.5 10*3/uL — ABNORMAL LOW (ref 4.0–10.5)
nRBC: 0 % (ref 0.0–0.2)

## 2021-07-30 MED ORDER — PEGFILGRASTIM-BMEZ 6 MG/0.6ML ~~LOC~~ SOSY
6.0000 mg | PREFILLED_SYRINGE | Freq: Once | SUBCUTANEOUS | Status: AC
  Administered 2021-07-30: 6 mg via SUBCUTANEOUS
  Filled 2021-07-30: qty 0.6

## 2021-07-30 NOTE — Patient Instructions (Signed)

## 2021-07-30 NOTE — Telephone Encounter (Signed)
FYI

## 2021-07-30 NOTE — Telephone Encounter (Signed)
I sent him a mychart message- please follow up with him tomorrow

## 2021-07-30 NOTE — Telephone Encounter (Signed)
Patient Name: [name] Gender: Male DOB: 12/20/1945 Age: 75 Y 22 M 9 D Return Phone Number: [phone] (Primary), [phone] (Secondary) Address: City/ State/ Zip: [place]  [address] Client [facility] Client Site [facility] Night Physician Garret Reddish- MD Contact Type Call Who Is Calling Patient / Member / Family / Caregiver Call Type Triage / Clinical Relationship To Patient Self Return Phone Number [phone] (Primary) Chief Complaint FEVER - any fever in cancer, chemotherapy, oncology, sickle cell or HIV patient. Reason for Call Request to Schedule Office Appointment Initial Comment Caller states he is a cancer patient and he has a fever of 104. He is calling to schedule an appointment. Translation No Nurse Assessment Nurse: Zenia Resides, RN, Diane Date/Time Eilene Ghazi Time): 07/30/2021 8:12:39 AM Confirm and document reason for call. If symptomatic, describe symptoms. ---Caller states he is a cancer patient and he has a fever of 104 and some nausea. No sob or chest pain or pressure. Fever started this morning. Does the patient have any new or worsening symptoms? ---Yes Will a triage be completed? ---Yes Related visit to physician within the last 2 weeks? ---Yes Does the PT have any chronic conditions? (i.e. diabetes, asthma, this includes High risk factors for pregnancy, etc.) ---Yes List chronic conditions. ---CLL Is this a behavioral health or substance abuse call? ---No Guidelines Guideline Title Affirmed Question Affirmed Notes Nurse Date/Time (Eastern Time) Fever Fever > 104 F (40 C) Allen, RN, Diane 07/30/2021 8:14:12 AM Disp. Time Eilene Ghazi Time) Disposition Final User 07/30/2021 8:11:27 AM Send to Urgent Queue Lacretia Nicks PLEASE NOTE: All timestamps contained within this report are represented as [place] Standard Time. CONFIDENTIALTY NOTICE: This fax transmission is  intended only for the addressee. It contains information that is legally privileged, confidential or otherwise protected from use or disclosure. If you are not the intended recipient, you are strictly prohibited from reviewing, disclosing, copying using or disseminating any of this information or taking any action in reliance on or regarding this information. If you have received this fax in error, please notify us immediately by telephone so that we can arrange for its return to [name]. Phone: [phone], Toll-Free: [phone], Fax: [phone] Page: 2 of 2 Call Id: [id number] 07/30/2021 8:17:44 AM See HCP within 4 Hours (or PCP triage) Yes Zenia Resides, RN, Diane Caller Disagree/Comply Comply Caller Understands Yes PreDisposition Call Doctor Care Advice Given Per Guideline SEE HCP (OR PCP TRIAGE) WITHIN 4 HOURS: * IF OFFICE WILL BE OPEN: You need to be seen within the next 3 or 4 hours. Call your doctor (or NP/PA) now or as soon as the office opens. FEVER MEDICINES: * For fevers above 101 F (38.3 C) take either acetaminophen or ibuprofen. * ACETAMINOPHEN - REGULAR STRENGTH TYLENOL: Take 650 mg (two 325 mg pills) by mouth every 4 to 6 hours as needed. Each Regular Strength Tylenol pill has 325 mg of acetaminophen. The most you should take each day is 3,250 mg (10 pills a day). * IBUPROFEN (E.G., MOTRIN, ADVIL): Take 400 mg (two 200 mg pills) by mouth every 6 hours. The most you should take each day is 1,200 mg (six 200 mg pills), unless your doctor has told you to take more. CALL BACK IF: * You become worse CARE ADVICE given per Fever (Adult) guideline. Comments User: Hildred Priest, RN Date/Time Eilene Ghazi Time): 07/30/2021 8:17:27 AM Had a temp Friday evening as well- it was 101. User: Hildred Priest, RN Date/Time Eilene Ghazi  Time): 07/30/2021 8:19:28 AM Warm transferred pt. for appt. Referrals REFERRED TO PCP OFFICE

## 2021-07-31 ENCOUNTER — Encounter: Payer: Self-pay | Admitting: Hematology & Oncology

## 2021-07-31 NOTE — Telephone Encounter (Signed)
LMTCB for further information.

## 2021-08-01 LAB — CULTURE, BLOOD (ROUTINE X 2)
Culture: NO GROWTH
Special Requests: ADEQUATE

## 2021-08-01 NOTE — Telephone Encounter (Signed)
Spoke with pt who states he had overall weakness yesterday. Pt stayed in bed all day. Pt is feeling better today up and moving around good. Pt had the Ziextenzo injection on 07/30/21. Advised pt that could have been the cause of why he felt bad. Advised pt he can have aches and pains with injection. Pt wants to know how often her can receive the Ziextenzo. Will verify with pharmacy and send a MyChart message advising pt of answer.

## 2021-08-02 ENCOUNTER — Other Ambulatory Visit: Payer: Self-pay | Admitting: Family Medicine

## 2021-08-06 ENCOUNTER — Ambulatory Visit (INDEPENDENT_AMBULATORY_CARE_PROVIDER_SITE_OTHER): Payer: Medicare Other

## 2021-08-06 ENCOUNTER — Other Ambulatory Visit: Payer: Self-pay

## 2021-08-06 DIAGNOSIS — I2699 Other pulmonary embolism without acute cor pulmonale: Secondary | ICD-10-CM

## 2021-08-06 DIAGNOSIS — Z952 Presence of prosthetic heart valve: Secondary | ICD-10-CM

## 2021-08-06 DIAGNOSIS — I480 Paroxysmal atrial fibrillation: Secondary | ICD-10-CM

## 2021-08-06 DIAGNOSIS — Z5181 Encounter for therapeutic drug level monitoring: Secondary | ICD-10-CM

## 2021-08-06 LAB — POCT INR: INR: 4.9 — AB (ref 2.0–3.0)

## 2021-08-06 NOTE — Patient Instructions (Signed)
-   skip warfarin tonight and tomorrow, then - resume same dosage 1.5 tablets daily except for 1 tablet on Mondays. Recheck INR in 1 week

## 2021-08-09 ENCOUNTER — Ambulatory Visit: Payer: Medicare Other | Admitting: Psychology

## 2021-08-09 DIAGNOSIS — F4321 Adjustment disorder with depressed mood: Secondary | ICD-10-CM | POA: Diagnosis not present

## 2021-08-13 ENCOUNTER — Ambulatory Visit (INDEPENDENT_AMBULATORY_CARE_PROVIDER_SITE_OTHER): Payer: No Typology Code available for payment source | Admitting: *Deleted

## 2021-08-13 ENCOUNTER — Other Ambulatory Visit: Payer: Self-pay

## 2021-08-13 DIAGNOSIS — Z952 Presence of prosthetic heart valve: Secondary | ICD-10-CM | POA: Diagnosis not present

## 2021-08-13 DIAGNOSIS — I2699 Other pulmonary embolism without acute cor pulmonale: Secondary | ICD-10-CM | POA: Diagnosis not present

## 2021-08-13 DIAGNOSIS — I480 Paroxysmal atrial fibrillation: Secondary | ICD-10-CM

## 2021-08-13 DIAGNOSIS — Z5181 Encounter for therapeutic drug level monitoring: Secondary | ICD-10-CM

## 2021-08-13 LAB — POCT INR: INR: 2.7 (ref 2.0–3.0)

## 2021-08-13 NOTE — Patient Instructions (Signed)
Description   Continue taking Warfarin 1.5 tablets daily except for 1 tablet on Mondays. Recheck INR in 2 weeks. [facility] [phone]

## 2021-08-18 ENCOUNTER — Encounter (HOSPITAL_COMMUNITY): Payer: Self-pay

## 2021-08-18 ENCOUNTER — Other Ambulatory Visit: Payer: Self-pay

## 2021-08-18 ENCOUNTER — Emergency Department (HOSPITAL_COMMUNITY): Payer: No Typology Code available for payment source

## 2021-08-18 ENCOUNTER — Emergency Department (HOSPITAL_COMMUNITY)
Admission: EM | Admit: 2021-08-18 | Discharge: 2021-08-18 | Disposition: A | Discharge status: Home / Self Care | Payer: No Typology Code available for payment source | Attending: Emergency Medicine | Admitting: Emergency Medicine

## 2021-08-18 DIAGNOSIS — Z20822 Contact with and (suspected) exposure to covid-19: Secondary | ICD-10-CM | POA: Diagnosis not present

## 2021-08-18 DIAGNOSIS — Z7901 Long term (current) use of anticoagulants: Secondary | ICD-10-CM | POA: Insufficient documentation

## 2021-08-18 DIAGNOSIS — I129 Hypertensive chronic kidney disease with stage 1 through stage 4 chronic kidney disease, or unspecified chronic kidney disease: Secondary | ICD-10-CM | POA: Diagnosis not present

## 2021-08-18 DIAGNOSIS — R61 Generalized hyperhidrosis: Secondary | ICD-10-CM | POA: Diagnosis not present

## 2021-08-18 DIAGNOSIS — R0602 Shortness of breath: Secondary | ICD-10-CM | POA: Diagnosis not present

## 2021-08-18 DIAGNOSIS — N183 Chronic kidney disease, stage 3 unspecified: Secondary | ICD-10-CM | POA: Insufficient documentation

## 2021-08-18 DIAGNOSIS — R110 Nausea: Secondary | ICD-10-CM | POA: Diagnosis not present

## 2021-08-18 DIAGNOSIS — D72819 Decreased white blood cell count, unspecified: Secondary | ICD-10-CM | POA: Insufficient documentation

## 2021-08-18 DIAGNOSIS — Z79899 Other long term (current) drug therapy: Secondary | ICD-10-CM | POA: Diagnosis not present

## 2021-08-18 DIAGNOSIS — Z87891 Personal history of nicotine dependence: Secondary | ICD-10-CM | POA: Insufficient documentation

## 2021-08-18 DIAGNOSIS — Z8546 Personal history of malignant neoplasm of prostate: Secondary | ICD-10-CM | POA: Insufficient documentation

## 2021-08-18 DIAGNOSIS — R000 Tachycardia, unspecified: Secondary | ICD-10-CM | POA: Insufficient documentation

## 2021-08-18 DIAGNOSIS — R509 Fever, unspecified: Secondary | ICD-10-CM | POA: Diagnosis present

## 2021-08-18 LAB — CBC WITH DIFFERENTIAL/PLATELET
Abs Immature Granulocytes: 0.04 10*3/uL (ref 0.00–0.07)
Basophils Absolute: 0 10*3/uL (ref 0.0–0.1)
Basophils Relative: 1 %
Eosinophils Absolute: 0 10*3/uL (ref 0.0–0.5)
Eosinophils Relative: 0 %
HCT: 36.7 % — ABNORMAL LOW (ref 39.0–52.0)
Hemoglobin: 12 g/dL — ABNORMAL LOW (ref 13.0–17.0)
Immature Granulocytes: 2 %
Lymphocytes Relative: 14 %
Lymphs Abs: 0.4 10*3/uL — ABNORMAL LOW (ref 0.7–4.0)
MCH: 31 pg (ref 26.0–34.0)
MCHC: 32.7 g/dL (ref 30.0–36.0)
MCV: 94.8 fL (ref 80.0–100.0)
Monocytes Absolute: 0.4 10*3/uL (ref 0.1–1.0)
Monocytes Relative: 17 %
Neutro Abs: 1.7 10*3/uL (ref 1.7–7.7)
Neutrophils Relative %: 66 %
Platelets: 110 10*3/uL — ABNORMAL LOW (ref 150–400)
RBC: 3.87 MIL/uL — ABNORMAL LOW (ref 4.22–5.81)
RDW: 13.9 % (ref 11.5–15.5)
WBC: 2.6 10*3/uL — ABNORMAL LOW (ref 4.0–10.5)
nRBC: 0 % (ref 0.0–0.2)

## 2021-08-18 LAB — COMPREHENSIVE METABOLIC PANEL
ALT: 19 U/L (ref 0–44)
AST: 24 U/L (ref 15–41)
Albumin: 4 g/dL (ref 3.5–5.0)
Alkaline Phosphatase: 76 U/L (ref 38–126)
Anion gap: 9 (ref 5–15)
BUN: 35 mg/dL — ABNORMAL HIGH (ref 8–23)
CO2: 24 mmol/L (ref 22–32)
Calcium: 9.5 mg/dL (ref 8.9–10.3)
Chloride: 106 mmol/L (ref 98–111)
Creatinine, Ser: 2.06 mg/dL — ABNORMAL HIGH (ref 0.61–1.24)
GFR, Estimated: 33 mL/min — ABNORMAL LOW (ref >60)
Glucose, Bld: 127 mg/dL — ABNORMAL HIGH (ref 70–99)
Potassium: 3.9 mmol/L (ref 3.5–5.1)
Sodium: 139 mmol/L (ref 135–145)
Total Bilirubin: 0.7 mg/dL (ref 0.3–1.2)
Total Protein: 6.9 g/dL (ref 6.5–8.1)

## 2021-08-18 LAB — URINALYSIS, ROUTINE W REFLEX MICROSCOPIC
Bilirubin Urine: NEGATIVE
Glucose, UA: NEGATIVE mg/dL
Ketones, ur: NEGATIVE mg/dL
Leukocytes,Ua: NEGATIVE
Nitrite: NEGATIVE
Protein, ur: NEGATIVE mg/dL
Specific Gravity, Urine: 1.023 (ref 1.005–1.030)
pH: 6 (ref 5.0–8.0)

## 2021-08-18 LAB — LACTIC ACID, PLASMA: Lactic Acid, Venous: 1.2 mmol/L (ref 0.5–1.9)

## 2021-08-18 LAB — RESP PANEL BY RT-PCR (FLU A&B, COVID) ARPGX2
Influenza A by PCR: NEGATIVE
Influenza B by PCR: NEGATIVE
SARS Coronavirus 2 by RT PCR: NEGATIVE

## 2021-08-18 LAB — PROTIME-INR
INR: 2.5 — ABNORMAL HIGH (ref 0.8–1.2)
Prothrombin Time: 27.3 seconds — ABNORMAL HIGH (ref 11.4–15.2)

## 2021-08-18 MED ORDER — ACETAMINOPHEN 500 MG PO TABS
1000.0000 mg | ORAL_TABLET | Freq: Once | ORAL | Status: AC
  Administered 2021-08-18: 1000 mg via ORAL
  Filled 2021-08-18: qty 2

## 2021-08-18 MED ORDER — AMOXICILLIN-POT CLAVULANATE 875-125 MG PO TABS: 1.0000 | ORAL_TABLET | Freq: Two times a day (BID) | ORAL | 0 refills | Status: DC

## 2021-08-18 MED ORDER — SODIUM CHLORIDE 0.9 % IV BOLUS
1000.0000 mL | Freq: Once | INTRAVENOUS | Status: AC
  Administered 2021-08-18: 1000 mL via INTRAVENOUS

## 2021-08-18 NOTE — Discharge Instructions (Addendum)
I prescribed you an antibiotic called Augmentin.  Please take this twice a day for the next 7 days.  Please do not stop taking this antibiotic early.  Please continue to monitor your symptoms closely.  I would recommend taking Tylenol for your fevers and staying adequately hydrated.  Please follow-up with your regular doctor next week.  If you develop any new or worsening symptoms please come back to the emergency department immediately.  It was a pleasure to meet you.

## 2021-08-18 NOTE — ED Provider Notes (Signed)
[place] DEPT Provider Note   CSN: [id number] Arrival date & time: 08/18/21  1707     History Chief Complaint  Patient presents with   Shortness of Breath   Fever    [name] is a 75 y.o. male.  HPI Patient is a 75 year old male with a history of aortic regurgitation, PE, B-cell CLL, PAF, hypertension, hyperlipidemia, right-sided pulmonary nodule status post resection, neutropenic fever, pancytopenia, who presents to the emergency department due to body aches.  Patient states he has a history of CLL and used to take oral chemotherapy.  He states that he was becoming pancytopenic so this was discontinued about 2 to 3 months ago.  He notes last night he began experiencing intermittent chills as well as night sweats.  This morning he woke up and was continuing to have the same symptoms.  He found that he was febrile this afternoon with a T-max of 104 F so he came to the emergency department.  He reports associated shortness of breath as well as nausea.  Denies any cough, rhinorrhea, sore throat, chest pain, abdominal pain, diarrhea, urinary complaints.  States he is vaccinated for COVID-19 x4 and denies any known history of previous COVID-19 infections.      Past Medical History:  Diagnosis Date   Anticoagulants causing adverse effect in therapeutic use    Anxiety    Paxil seemed to cause odd neuro side effects; better on prozac as of 09/2015   Aortic regurgitation 04/2007 surgery   Resolved with tissue AVR at [facility]   Cervical spondylosis    ESI by [name] in [place]. 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 (most recent 07/2017)   History of prostate cancer 2003   Rad prost   History of pulmonary embolism 2011; 10/2014   Recurrence off of anticoagulation 10/2014: per hem/onc ([name]) pt needs lifelong anticoagulation (hypercoag w/u neg).   Hyperlipidemia     statin-intolerant, except for crestor low dose.   Hypertension    Migraine syndrome    Mitral regurgitation    PAF (paroxysmal atrial fibrillation) (HCC)    Panic attacks    "           "             "                  "                  "                       "                             "                          Pulmonary nodule, right 2015   RLL: resected at [facility] --VATS; Benign RLL nodule (fungal and AFB stains neg).  Plan is for [facility] thoracic surg to do f/u o/v & CT chest 1 yr    Right-sided headache 07/2016   Primary stabbing HA or migraine per neuro (Dr. Tomi Likens).  Gabapentin and topamax not tolerated.  These resolved spontaneously.    Patient Active Problem List   Diagnosis Date Noted   Aortic  atherosclerosis ([place]) 07/25/2021   Pneumonia of right lower lobe due to infectious organism    Neutropenic fever ([place])    FUO (fever of unknown origin)    Pancytopenia ([place])    Positive blood culture 07/09/2021   Anticoagulants causing adverse effect in therapeutic use 06/22/2021   Encounter for therapeutic drug monitoring 09/21/2019   PTSD (post-traumatic stress disorder) 09/29/2018   BPPV (benign paroxysmal positional vertigo) 09/04/2017   CKD (chronic kidney disease), stage III ([place]) 08/11/2017   Former smoker 08/29/2016   Cervical spondylosis    S/P Bioprosthetic AVR (aortic valve replacement) in 2011 03/22/2015   History of pulmonary embolism 03/07/2015   Recurrent pulmonary embolism ([place]) 11/09/2014   Lung nodule 11/02/2014   Chronic lymphocytic leukemia ([place]) 11/11/2013   Migraine 10/08/2010   GERD 12/11/2009   MITRAL REGURGITATION 04/07/2009   Insomnia 02/03/2008   Anxiety state 09/06/2007   HLD (hyperlipidemia) 04/20/2007   Essential hypertension 04/20/2007   Atrial fibrillation ([place]) 04/20/2007   PROSTATE CANCER, HX OF 04/20/2007    Past Surgical History:  Procedure Laterality Date   AORTIC VALVE REPLACEMENT  2011   [place]  (bioprosthetic)   CARDIAC  CATHETERIZATION  04/2010   Normal coronaries.   Carotid dopplers  08/2015   NORMAL   CATARACT EXTRACTION     L 12/06/09   R 09/14/09   COLONOSCOPY  01/26/13   tic's, o/w normal.  ([place])--recall 10 yrs.   PENILE PROSTHESIS IMPLANT     PROSTATECTOMY  04/2002   for prostate cancer   Pulmonary nodule resection  Fall/winter 2016   [facility]   SHOULDER SURGERY     rotator cuff on both arms    TEE WITHOUT CARDIOVERSION N/A 07/13/2021   Procedure: TRANSESOPHAGEAL ECHOCARDIOGRAM (TEE);  Surgeon: Pixie Casino, MD;  Location: [facility] ENDOSCOPY;  Service: Cardiovascular;  Laterality: N/A;   TIBIALIS TENDON TRANSFER / REPAIR Right 03/02/2019   Procedure: RECONSTRUCTION OF RIGHT  ANTERIOR TIBIALIS TENDON;  Surgeon: Erle Crocker, MD;  Location: [place];  Service: Orthopedics;  Laterality: Right;   TRANSTHORACIC ECHOCARDIOGRAM  09/20/2014; 03/2016; 03/2017   2015-mild LVH, EF 50-55%, wall motion nl, grade I diast dysfxn, prosth aort valve good,transaortic gradients decreased compared to echo 11/2013.   03/2016: EF 55-60%, normal LV function, severe LVH, normal diast fxn, mild increase in AV gradient compared to 09/2014 echo.  2018: EF 55-60%, normal LV fxn, grd II DD, AV stable/gradient's stable.       Family History  Problem Relation Age of Onset   Cancer Sister        uterine   Colon cancer Sister 5   Stroke Mother    Prostate cancer Father    Stroke Sister    Stroke Brother    Heart attack Neg Hx     Social History   Tobacco Use   Smoking status: Former    Packs/day: 1.00    Years: 12.00    Pack years: 12.00    Types: Cigarettes    Start date: 11/28/1968    Quit date: 01/29/1981    Years since quitting: 40.5   Smokeless tobacco: Never   Tobacco comments:    quit 75 yo   Vaping Use   Vaping Use: Never used  Substance Use Topics   Alcohol use: Not Currently    Comment: occasional   Drug use: No    Home Medications Prior to Admission medications   Medication Sig  Start Date End Date Taking? Authorizing Provider  amoxicillin-clavulanate (AUGMENTIN) 875-125 MG tablet Take 1 tablet by mouth every 12 (twelve) hours. 08/18/21  Yes Rayna Sexton, PA-C  Cholecalciferol (VITAMIN D3) 50 MCG (2000 UT) capsule Take 2,000 Units by mouth every morning.   Yes [provider]  [employer] MOTION SICKNESS RELIEF 25 MG CHEW CHEW 1 TABLET EVERY DAY AS NEEDED FOR DIZZINESS Patient taking differently: Chew 25 mg by mouth every morning. 06/06/21  Yes [name], MD  docusate sodium (COLACE) 100 MG capsule Take 100 mg by mouth daily as needed (constipation).   Yes [provider]  hydrochlorothiazide (HYDRODIURIL) 12.5 MG tablet TAKE 1 TABLET BY MOUTH EVERY DAY Patient taking differently: Take 12.5 mg by mouth every morning. 04/04/21  Yes [name], MD  ipratropium (ATROVENT) 0.03 % nasal spray Place 2 sprays into both nostrils every 12 (twelve) hours. Patient taking differently: Place 2 sprays into both nostrils 2 (two) times daily as needed for rhinitis (congestion). 10/04/20  Yes Inda Coke, PA  LORazepam (ATIVAN) 0.5 MG tablet Take 1 tablet (0.5 mg total) by mouth at bedtime as needed for anxiety (can take 1/4 or 1/2 tablet  if effective.). Patient taking differently: Take 0.125-0.5 mg by mouth at bedtime as needed for anxiety. 08/02/21  Yes [name], MD  omeprazole (PRILOSEC) 40 MG capsule Take 40 mg by mouth every evening. 10/18/20  Yes [provider]  rosuvastatin (CRESTOR) 10 MG tablet TAKE 1 TABLET BY MOUTH EVERY DAY Patient taking differently: Take 10 mg by mouth at bedtime. 11/01/20  Yes [name], MD  sertraline (ZOLOFT) 100 MG tablet Take 100 mg by mouth every morning.   Yes [provider]  sodium chloride (OCEAN) 0.65 % SOLN nasal spray Place 1 spray into both nostrils as needed for congestion.   Yes [provider]  warfarin (COUMADIN) 5 MG tablet TAKE AS DIRECTED BY COUMADIN CLINIC Patient  taking differently: Take 5-7.5 mg by mouth See admin instructions. Take 7.5 mg by mouth in the evening on Sun/Tues/Wed/Thurs/Fri/Sat and 5 mg on Mondays 03/19/21  Yes Josue Hector, MD  venetoclax (VENCLEXTA) 100 MG tablet Take 1 tablet (100 mg total) by mouth daily. Tablets should be swallowed whole with a meal and a full glass of water. Patient not taking: No sig reported 07/05/21   Volanda Napoleon, MD    Allergies    Hydrocodone-acetaminophen, Oxycodone hcl, Telmisartan-hctz, Ramipril, Aspirin, Atorvastatin, Buspirone hcl, Codeine, Morphine sulfate, Paroxetine, and Rabeprazole  Review of Systems   Review of Systems  All other systems reviewed and are negative. Ten systems reviewed and are negative for acute change, except as noted in the HPI.   Physical Exam Updated Vital Signs BP 114/74 (BP Location: Right Arm)   Pulse 71   Temp 98.7 F (37.1 C) (Oral)   Resp 20   Ht 5\' 10"  (1.778 m)   Wt 89.8 kg   SpO2 96%   BMI 28.41 kg/m   Physical Exam Vitals and nursing note reviewed.  Constitutional:      General: He is not in acute distress.    Appearance: Normal appearance. He is well-developed and normal weight. He is not ill-appearing, toxic-appearing or diaphoretic.     Interventions: He is not intubated. HENT:     Head: Normocephalic and atraumatic.     Right Ear: External ear normal.     Left Ear: External ear normal.     Nose: Nose normal.     Mouth/Throat:     Mouth: Mucous  membranes are moist.     Pharynx: Oropharynx is clear. No oropharyngeal exudate or posterior oropharyngeal erythema.  Eyes:     Extraocular Movements: Extraocular movements intact.  Cardiovascular:     Rate and Rhythm: Regular rhythm. Tachycardia present.     Pulses: Normal pulses.     Heart sounds: Normal heart sounds. No murmur heard.   No friction rub. No gallop.     Comments: Tachycardic.   Pulmonary:     Effort: Pulmonary effort is normal. No tachypnea, bradypnea, accessory muscle usage or  respiratory distress. He is not intubated.     Breath sounds: Normal breath sounds. No stridor. No decreased breath sounds, wheezing, rhonchi or rales.     Comments: Lungs are clear to auscultation bilaterally.  Oxygen saturations fluctuating between the low to mid 90s. Abdominal:     General: Abdomen is flat.     Tenderness: There is no abdominal tenderness.  Musculoskeletal:        General: Normal range of motion.     Cervical back: Normal range of motion and neck supple. No tenderness.  Skin:    General: Skin is warm and dry.  Neurological:     General: No focal deficit present.     Mental Status: He is alert and oriented to person, place, and time.  Psychiatric:        Mood and Affect: Mood normal.        Behavior: Behavior normal.    ED Results / Procedures / Treatments   Labs (all labs ordered are listed, but only abnormal results are displayed) Labs Reviewed  COMPREHENSIVE METABOLIC PANEL - Abnormal; Notable for the following components:      Result Value   Glucose, Bld 127 (*)    BUN 35 (*)    Creatinine, Ser 2.06 (*)    GFR, Estimated 33 (*)    All other components within normal limits  CBC WITH DIFFERENTIAL/PLATELET - Abnormal; Notable for the following components:   WBC 2.6 (*)    RBC 3.87 (*)    Hemoglobin 12.0 (*)    HCT 36.7 (*)    Platelets 110 (*)    Lymphs Abs 0.4 (*)    All other components within normal limits  PROTIME-INR - Abnormal; Notable for the following components:   Prothrombin Time 27.3 (*)    INR 2.5 (*)    All other components within normal limits  URINALYSIS, ROUTINE W REFLEX MICROSCOPIC - Abnormal; Notable for the following components:   Hgb urine dipstick MODERATE (*)    Bacteria, UA RARE (*)    All other components within normal limits  RESP PANEL BY RT-PCR (FLU A&B, COVID) ARPGX2  CULTURE, BLOOD (ROUTINE X 2)  CULTURE, BLOOD (ROUTINE X 2)  LACTIC ACID, PLASMA  LACTIC ACID, PLASMA   EKG None  Radiology DG Chest 2 View  Result  Date: 08/18/2021 CLINICAL DATA:  Short of breath and fever, suspected sepsis EXAM: CHEST - 2 VIEW COMPARISON:  07/08/2021 FINDINGS: Frontal and lateral views of the chest demonstrates stable postsurgical changes from aortic valve replacement. Cardiac silhouette is unremarkable. No airspace disease, effusion, or pneumothorax. No acute bony abnormalities. IMPRESSION: 1. No acute intrathoracic process. Electronically Signed   By: Randa Ngo M.D.   On: 08/18/2021 18:42    Procedures Procedures   Medications Ordered in ED Medications  acetaminophen (TYLENOL) tablet 1,000 mg (1,000 mg Oral Given 08/18/21 1845)  sodium chloride 0.9 % bolus 1,000 mL (0 mLs Intravenous Stopped 08/18/21 2002)  ED Course  I have reviewed the triage vital signs and the nursing notes.  Pertinent labs & imaging results that were available during my care of the patient were reviewed by me and considered in my medical decision making (see chart for details).  Clinical Course as of 08/18/21 2134  Sat Aug 18, 2021  2044 INR(!): 2.5 On warfarin for atrial fibrillation [LJ]    Clinical Course User Index [LJ] Rayna Sexton, PA-C   MDM Rules/Calculators/A&P                          Pt is a 75 y.o. male with a history of CLL who presents to the emergency department with fevers, rigors, diaphoresis.  Labs: CBC with a white count of 2.6, hemoglobin of 12, hematocrit of 36.7, platelets of 110, lymphocytes of 0.4. Prothrombin time of 27.3 with an INR of 2.5. CMP with a glucose of 127, BUN of 35, creatinine of 2.06, GFR 33. Lactic acid of 1.2. Blood cultures obtained. Respiratory panel is negative.  Imaging: Chest x-ray is negative.  I, Rayna Sexton, PA-C, personally reviewed and evaluated these images and lab results as part of my medical decision-making.  Unsure the source of the patient's symptoms.  Denies any cough, rhinorrhea, sore throat, chest pain, abdominal pain, vomiting, diarrhea, urinary  complaints.  Patient is leukopenic at this visit but does not appear neutropenic.  UA does not appear infectious.  Respiratory panel is negative.  Creatinine of 2.06 with a GFR of 33.  Possibly a mild AKI.  Baseline creatinine around 1.5.  Patient treated with 1 L of IV fluids as well as 1 g of Tylenol.  Patient states he feels significantly better.  Fever and tachycardia have resolved.  Patient discussed with my attending physician Dr. Thamas Jaegers.  Discussed admission with the patient and he declined.  Patient states he feels comfortable being discharged at this time.  Feel that this is reasonable.  Blood cultures were obtained.  Recommended patient continue with Tylenol as needed for management of his fevers.  We will discharge him on a course of Augmentin.  He was given very strict return precautions and he knows to return to the emergency department with any new or worsening symptoms.  His questions were answered and he was amicable at the time of discharge.  Note: Portions of this report may have been transcribed using voice recognition software. Every effort was made to ensure accuracy; however, inadvertent computerized transcription errors may be present.   Final Clinical Impression(s) / ED Diagnoses Final diagnoses:  Fever of unknown origin   Rx / DC Orders ED Discharge Orders          Ordered    amoxicillin-clavulanate (AUGMENTIN) 875-125 MG tablet  Every 12 hours        08/18/21 2133             Rayna Sexton, PA-C 08/18/21 2140    Luna Fuse, MD 08/29/21 [phone]

## 2021-08-18 NOTE — ED Provider Notes (Signed)
Emergency Medicine Provider Triage Evaluation Note  [name] , a 75 y.o. male  was evaluated in triage.  Pt complains of sob.  Review of Systems  Positive: Fever, chills, sob, decreased appetite Negative: Runny nose, sneeze, cough, abd pain, dysuria, headache, neck stiffness, sore throat  Physical Exam  BP 115/74 (BP Location: Left Arm)   Pulse (!) 116   Temp (!) 103.2 F (39.6 C) (Oral)   Resp 20   Ht 5\' 10"  (1.778 m)   Wt 89.8 kg   SpO2 94%   BMI 28.41 kg/m  Gen:   Awake, no distress   Resp:  Normal effort  MSK:   Moves extremities without difficulty  Other:  tachycardic  Medical Decision Making  Medically screening exam initiated at 5:57 PM.  Appropriate orders placed.  [name] was informed that the remainder of the evaluation will be completed by another provider, this initial triage assessment does not replace that evaluation, and the importance of remaining in the ED until their evaluation is complete.  Pt with fever, body aches and sob since last night.  No cough, dysuria, or abd pain.  Decreased appetite.  Has fever of 103.2, and tachycardic. Potential sepsis   Domenic Moras, Hershal Coria 08/18/21 1805    Long, Wonda Olds, MD 08/19/21 2236

## 2021-08-18 NOTE — ED Triage Notes (Signed)
Patient reports he began having SOB and fever today. T. In triage 103.2 orally. Patient denies any chest pain or dysusria

## 2021-08-19 ENCOUNTER — Encounter (HOSPITAL_COMMUNITY): Payer: Self-pay | Admitting: Emergency Medicine

## 2021-08-19 ENCOUNTER — Other Ambulatory Visit: Payer: Self-pay

## 2021-08-19 ENCOUNTER — Emergency Department (HOSPITAL_COMMUNITY): Payer: No Typology Code available for payment source

## 2021-08-19 ENCOUNTER — Inpatient Hospital Stay (HOSPITAL_COMMUNITY)
Admission: EM | Admit: 2021-08-19 | Discharge: 2021-08-21 | DRG: 809 | Disposition: A | Discharge status: Home / Self Care | Payer: No Typology Code available for payment source | Attending: Family Medicine | Admitting: Family Medicine

## 2021-08-19 DIAGNOSIS — Z8546 Personal history of malignant neoplasm of prostate: Secondary | ICD-10-CM | POA: Diagnosis not present

## 2021-08-19 DIAGNOSIS — N183 Chronic kidney disease, stage 3 unspecified: Secondary | ICD-10-CM | POA: Diagnosis present

## 2021-08-19 DIAGNOSIS — E872 Acidosis, unspecified: Secondary | ICD-10-CM | POA: Diagnosis present

## 2021-08-19 DIAGNOSIS — F411 Generalized anxiety disorder: Secondary | ICD-10-CM | POA: Diagnosis present

## 2021-08-19 DIAGNOSIS — Z952 Presence of prosthetic heart valve: Secondary | ICD-10-CM

## 2021-08-19 DIAGNOSIS — B349 Viral infection, unspecified: Secondary | ICD-10-CM | POA: Diagnosis present

## 2021-08-19 DIAGNOSIS — Z20822 Contact with and (suspected) exposure to covid-19: Secondary | ICD-10-CM | POA: Diagnosis present

## 2021-08-19 DIAGNOSIS — D703 Neutropenia due to infection: Principal | ICD-10-CM | POA: Diagnosis present

## 2021-08-19 DIAGNOSIS — Z888 Allergy status to other drugs, medicaments and biological substances status: Secondary | ICD-10-CM

## 2021-08-19 DIAGNOSIS — D6959 Other secondary thrombocytopenia: Secondary | ICD-10-CM | POA: Diagnosis present

## 2021-08-19 DIAGNOSIS — I4891 Unspecified atrial fibrillation: Secondary | ICD-10-CM | POA: Diagnosis present

## 2021-08-19 DIAGNOSIS — Z86711 Personal history of pulmonary embolism: Secondary | ICD-10-CM

## 2021-08-19 DIAGNOSIS — D709 Neutropenia, unspecified: Secondary | ICD-10-CM | POA: Diagnosis present

## 2021-08-19 DIAGNOSIS — K219 Gastro-esophageal reflux disease without esophagitis: Secondary | ICD-10-CM | POA: Diagnosis present

## 2021-08-19 DIAGNOSIS — I10 Essential (primary) hypertension: Secondary | ICD-10-CM | POA: Diagnosis present

## 2021-08-19 DIAGNOSIS — C9110 Chronic lymphocytic leukemia of B-cell type not having achieved remission: Secondary | ICD-10-CM | POA: Diagnosis present

## 2021-08-19 DIAGNOSIS — I129 Hypertensive chronic kidney disease with stage 1 through stage 4 chronic kidney disease, or unspecified chronic kidney disease: Secondary | ICD-10-CM | POA: Diagnosis present

## 2021-08-19 DIAGNOSIS — M47812 Spondylosis without myelopathy or radiculopathy, cervical region: Secondary | ICD-10-CM | POA: Diagnosis present

## 2021-08-19 DIAGNOSIS — C9011 Plasma cell leukemia in remission: Secondary | ICD-10-CM | POA: Diagnosis not present

## 2021-08-19 DIAGNOSIS — T451X5A Adverse effect of antineoplastic and immunosuppressive drugs, initial encounter: Secondary | ICD-10-CM | POA: Diagnosis present

## 2021-08-19 DIAGNOSIS — I480 Paroxysmal atrial fibrillation: Secondary | ICD-10-CM | POA: Diagnosis present

## 2021-08-19 DIAGNOSIS — Z953 Presence of xenogenic heart valve: Secondary | ICD-10-CM | POA: Diagnosis not present

## 2021-08-19 DIAGNOSIS — R5081 Fever presenting with conditions classified elsewhere: Secondary | ICD-10-CM | POA: Diagnosis present

## 2021-08-19 DIAGNOSIS — Z7901 Long term (current) use of anticoagulants: Secondary | ICD-10-CM | POA: Diagnosis not present

## 2021-08-19 DIAGNOSIS — Z79899 Other long term (current) drug therapy: Secondary | ICD-10-CM | POA: Diagnosis not present

## 2021-08-19 DIAGNOSIS — Z923 Personal history of irradiation: Secondary | ICD-10-CM

## 2021-08-19 DIAGNOSIS — D6481 Anemia due to antineoplastic chemotherapy: Secondary | ICD-10-CM | POA: Diagnosis present

## 2021-08-19 DIAGNOSIS — E785 Hyperlipidemia, unspecified: Secondary | ICD-10-CM | POA: Diagnosis present

## 2021-08-19 DIAGNOSIS — Z885 Allergy status to narcotic agent status: Secondary | ICD-10-CM | POA: Diagnosis not present

## 2021-08-19 DIAGNOSIS — N1832 Chronic kidney disease, stage 3b: Secondary | ICD-10-CM | POA: Diagnosis present

## 2021-08-19 LAB — CBC WITH DIFFERENTIAL/PLATELET
Abs Immature Granulocytes: 0.05 10*3/uL (ref 0.00–0.07)
Basophils Absolute: 0 10*3/uL (ref 0.0–0.1)
Basophils Relative: 0 %
Eosinophils Absolute: 0 10*3/uL (ref 0.0–0.5)
Eosinophils Relative: 0 %
HCT: 37.1 % — ABNORMAL LOW (ref 39.0–52.0)
Hemoglobin: 11.8 g/dL — ABNORMAL LOW (ref 13.0–17.0)
Immature Granulocytes: 2 %
Lymphocytes Relative: 30 %
Lymphs Abs: 0.8 10*3/uL (ref 0.7–4.0)
MCH: 30.7 pg (ref 26.0–34.0)
MCHC: 31.8 g/dL (ref 30.0–36.0)
MCV: 96.6 fL (ref 80.0–100.0)
Monocytes Absolute: 0.5 10*3/uL (ref 0.1–1.0)
Monocytes Relative: 19 %
Neutro Abs: 1.3 10*3/uL — ABNORMAL LOW (ref 1.7–7.7)
Neutrophils Relative %: 49 %
Platelets: 99 10*3/uL — ABNORMAL LOW (ref 150–400)
RBC: 3.84 MIL/uL — ABNORMAL LOW (ref 4.22–5.81)
RDW: 14 % (ref 11.5–15.5)
WBC: 2.7 10*3/uL — ABNORMAL LOW (ref 4.0–10.5)
nRBC: 0 % (ref 0.0–0.2)

## 2021-08-19 LAB — URINALYSIS, ROUTINE W REFLEX MICROSCOPIC
Bacteria, UA: NONE SEEN
Bilirubin Urine: NEGATIVE
Glucose, UA: NEGATIVE mg/dL
Ketones, ur: NEGATIVE mg/dL
Leukocytes,Ua: NEGATIVE
Nitrite: NEGATIVE
Protein, ur: 30 mg/dL — AB
Specific Gravity, Urine: 1.021 (ref 1.005–1.030)
pH: 6 (ref 5.0–8.0)

## 2021-08-19 LAB — LACTIC ACID, PLASMA: Lactic Acid, Venous: 4.7 mmol/L (ref 0.5–1.9)

## 2021-08-19 LAB — PROTIME-INR
INR: 2.5 — ABNORMAL HIGH (ref 0.8–1.2)
Prothrombin Time: 27.2 seconds — ABNORMAL HIGH (ref 11.4–15.2)

## 2021-08-19 MED ORDER — SODIUM CHLORIDE 0.9 % IV SOLN
2.0000 g | Freq: Three times a day (TID) | INTRAVENOUS | Status: DC
  Administered 2021-08-19: 2 g via INTRAVENOUS
  Filled 2021-08-19 (×2): qty 2

## 2021-08-19 MED ORDER — ACETAMINOPHEN 500 MG PO TABS
1000.0000 mg | ORAL_TABLET | Freq: Once | ORAL | Status: AC
  Administered 2021-08-19: 1000 mg via ORAL
  Filled 2021-08-19: qty 2

## 2021-08-19 MED ORDER — SODIUM CHLORIDE 0.9 % IV BOLUS
500.0000 mL | Freq: Once | INTRAVENOUS | Status: AC
  Administered 2021-08-19: 500 mL via INTRAVENOUS

## 2021-08-19 MED ORDER — SODIUM CHLORIDE 0.9 % IV SOLN
2.0000 g | Freq: Two times a day (BID) | INTRAVENOUS | Status: DC
  Administered 2021-08-20 – 2021-08-21 (×3): 2 g via INTRAVENOUS
  Filled 2021-08-19 (×3): qty 2

## 2021-08-19 MED ORDER — SALINE SPRAY 0.65 % NA SOLN: 1.0000 | NASAL | Status: DC | PRN

## 2021-08-19 MED ORDER — SERTRALINE HCL 100 MG PO TABS
100.0000 mg | ORAL_TABLET | Freq: Every morning | ORAL | Status: DC
  Administered 2021-08-20 – 2021-08-21 (×2): 100 mg via ORAL
  Filled 2021-08-19 (×2): qty 1

## 2021-08-19 MED ORDER — DOCUSATE SODIUM 100 MG PO CAPS: 100.0000 mg | ORAL_CAPSULE | Freq: Every day | ORAL | Status: DC | PRN

## 2021-08-19 MED ORDER — IPRATROPIUM BROMIDE 0.03 % NA SOLN: 2.0000 | Freq: Two times a day (BID) | NASAL | Status: DC | PRN

## 2021-08-19 MED ORDER — ROSUVASTATIN CALCIUM 10 MG PO TABS
10.0000 mg | ORAL_TABLET | Freq: Every day | ORAL | Status: DC
  Administered 2021-08-20 (×2): 10 mg via ORAL
  Filled 2021-08-19 (×2): qty 1

## 2021-08-19 MED ORDER — HYDROCHLOROTHIAZIDE 25 MG PO TABS
12.5000 mg | ORAL_TABLET | Freq: Every morning | ORAL | Status: DC
  Administered 2021-08-20: 12.5 mg via ORAL
  Filled 2021-08-19: qty 1

## 2021-08-19 MED ORDER — SODIUM CHLORIDE 0.9 % IV BOLUS (SEPSIS)
1000.0000 mL | Freq: Once | INTRAVENOUS | Status: AC
  Administered 2021-08-19: 1000 mL via INTRAVENOUS

## 2021-08-19 MED ORDER — LORAZEPAM 0.5 MG PO TABS
0.5000 mg | ORAL_TABLET | Freq: Every evening | ORAL | Status: DC | PRN
  Administered 2021-08-20: 0.5 mg via ORAL
  Filled 2021-08-19: qty 1

## 2021-08-19 MED ORDER — VITAMIN D3 25 MCG (1000 UNIT) PO TABS
2000.0000 [IU] | ORAL_TABLET | Freq: Every morning | ORAL | Status: DC
  Administered 2021-08-20 – 2021-08-21 (×2): 2000 [IU] via ORAL
  Filled 2021-08-19 (×4): qty 2

## 2021-08-19 MED ORDER — PANTOPRAZOLE SODIUM 40 MG PO TBEC
40.0000 mg | DELAYED_RELEASE_TABLET | Freq: Every day | ORAL | Status: DC
  Administered 2021-08-20 – 2021-08-21 (×2): 40 mg via ORAL
  Filled 2021-08-19 (×2): qty 1

## 2021-08-19 NOTE — ED Provider Notes (Signed)
Emergency Medicine Provider Triage Evaluation Note  [name] , a 75 y.o. male  was evaluated in triage.  Pt complains of headache and chills.  History of CLL was discharged yesterday after fever strict return precautions.  States he woke up this morning with chills, headache, and malaise.  Review of Systems  Positive: Chills, headache, fatigue, nausea Negative: Chest pain, shortness of breath, abdominal pain, vomiting  Physical Exam  BP 129/80 (BP Location: Right Arm)   Pulse 92   Temp 98.6 F (37 C) (Oral)   Resp 19   SpO2 99%  Gen:   Awake, no distress   Resp:  Normal effort  MSK:   Moves extremities without difficulty  Other:  Patient visibly shaking in exam room  Medical Decision Making  Medically screening exam initiated at 3:42 PM.  Appropriate orders placed.  [name] was informed that the remainder of the evaluation will be completed by another provider, this initial triage assessment does not replace that evaluation, and the importance of remaining in the ED until their evaluation is complete.     Estill Cotta 08/19/21 1542    Margette Fast, MD 08/19/21 2237

## 2021-08-19 NOTE — Progress Notes (Signed)
Pharmacy Antibiotic Note  [name] is a 75 y.o. male with a h/o CLL admitted on 08/19/2021 with neutropenic fever.  Pharmacy has been consulted for cefepime dosing.  Plan: Cefepime 2 g iv q 12 hours  Will sign off and follow remotely  Height: 5\' 10"  (177.8 cm) Weight: 86.2 kg (190 lb) IBW/kg (Calculated) : 73  Temp (24hrs), Avg:99.5 F (37.5 C), Min:98.1 F (36.7 C), Max:102.1 F (38.9 C)  Recent Labs  Lab 08/18/21 1900 08/19/21 1652  WBC 2.6* 2.7*  CREATININE 2.06*  --   LATICACIDVEN 1.2 4.7*    Estimated Creatinine Clearance: 32 mL/min (A) (by C-G formula based on SCr of 2.06 mg/dL (H)).    Allergies  Allergen Reactions   Hydrocodone-Acetaminophen Shortness Of Breath   Oxycodone Hcl Shortness Of Breath   Telmisartan-Hctz Shortness Of Breath, Palpitations and Other (See Comments)    Dizziness also   Ramipril Other (See Comments)    Unknown reaction   Aspirin Rash and Other (See Comments)    Confusion, also- Cannot take high doses   Atorvastatin Other (See Comments)    Myalgia    Buspirone Hcl Other (See Comments)    Headaches    Codeine Hives, Nausea And Vomiting and Other (See Comments)    Confusion also   Morphine Sulfate Itching   Paroxetine Other (See Comments)    Paresthesias: R arm, R leg, r side of face   Rabeprazole Other (See Comments)    headache    Thank you for allowing pharmacy to be a part of this patient's care.  Napoleon Form 08/19/2021 7:29 PM

## 2021-08-19 NOTE — H&P (Signed)
History and Physical   [name] YQI:[id number] DOB: 12/23/1945 DOA: 08/19/2021  Referring MD/NP/PA: Dr. Laverta Bell  PCP: [name] Olp, MD   Outpatient Specialists: Dr. Marin Bell  Patient coming from: Home  Chief Complaint: Fever and chills  HPI: [name] is a 75 y.o. male with medical history significant of CLL on chemotherapy, chronic anticoagulation from pulmonary embolism and atrial fibrillation, essential hypertension, pulmonary nodule, history of prostate cancer s/p radiation therapy, hyperlipidemia who presented to the ER with weakness of reported fever at home.  Patient was found to be neutropenic.  He was also found to have a fever up to 102.  Patient also has findings consistent with febrile neutropenia.  He also has thrombocytopenia.  Patient's PT is 27.2 INR 2.5.  Urinalysis negative.  He has lactic acidosis with lactic acid of 4.7.  Patient is therefore being admitted with febrile neutropenia..  ED Course: Temperature 103.2, blood pressure 140/80, pulse 92 respirate 20 and oxygen sat 98% on room air.  Chemistry showed a glucose 127 BUN 35 creatinine 2.06.  His white count is 2.7 hemoglobin 10.8 and platelets 99.  PT 27.2 INR 2.5.  Chest x-ray showed no acute findings.  Patient being admitted was provided for pia.  Review of Systems: As per HPI otherwise 10 point review of systems negative.    Past Medical History:  Diagnosis Date   Anticoagulants causing adverse effect in therapeutic use    Anxiety    Paxil seemed to cause odd neuro side effects; better on prozac as of 09/2015   Aortic regurgitation 04/2007 surgery   Resolved with tissue AVR at [facility]   Cervical spondylosis    ESI by Dr. Jacelyn Bell in [place]. 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 Marin Bell (most recent 07/2017)   History of prostate cancer 2003   Rad prost   History of pulmonary embolism 2011; 10/2014   Recurrence off of  anticoagulation 10/2014: per hem/onc (Dr. Marin Bell) pt needs lifelong anticoagulation (hypercoag w/u neg).   Hyperlipidemia    statin-intolerant, except for crestor low dose.   Hypertension    Migraine syndrome    Mitral regurgitation    PAF (paroxysmal atrial fibrillation) (HCC)    Panic attacks    "           "             "                  "                  "                       "                             "                          Pulmonary nodule, right 2015   RLL: resected at [facility] --VATS; Benign RLL nodule (fungal and AFB stains neg).  Plan is for [facility] thoracic surg to do f/u o/v & CT chest 1 yr    Right-sided headache 07/2016   Primary stabbing HA or migraine per neuro (Dr. Tomi Bell).  Gabapentin and topamax not tolerated.  These resolved spontaneously.  Past Surgical History:  Procedure Laterality Date   AORTIC VALVE REPLACEMENT  2011   [place]  (bioprosthetic)   CARDIAC CATHETERIZATION  04/2010   Normal coronaries.   Carotid dopplers  08/2015   NORMAL   CATARACT EXTRACTION     L 12/06/09   R 09/14/09   COLONOSCOPY  01/26/13   tic's, o/w normal.  ([name])--recall 10 yrs.   PENILE PROSTHESIS IMPLANT     PROSTATECTOMY  04/2002   for prostate cancer   Pulmonary nodule resection  Fall/winter 2016   [facility]   SHOULDER SURGERY     rotator cuff on both arms    TEE WITHOUT CARDIOVERSION N/A 07/13/2021   Procedure: TRANSESOPHAGEAL ECHOCARDIOGRAM (TEE);  Surgeon: [name] Casino, MD;  Location: [facility] ENDOSCOPY;  Service: Cardiovascular;  Laterality: N/A;   TIBIALIS TENDON TRANSFER / REPAIR Right 03/02/2019   Procedure: RECONSTRUCTION OF RIGHT  ANTERIOR TIBIALIS TENDON;  Surgeon: [name] Crocker, MD;  Location: [facility];  Service: Orthopedics;  Laterality: Right;   TRANSTHORACIC ECHOCARDIOGRAM  09/20/2014; 03/2016; 03/2017   2015-mild LVH, EF 50-55%, wall motion nl, grade I diast dysfxn, prosth aort valve good,transaortic gradients decreased compared to echo 11/2013.    03/2016: EF 55-60%, normal LV function, severe LVH, normal diast fxn, mild increase in AV gradient compared to 09/2014 echo.  2018: EF 55-60%, normal LV fxn, grd II DD, AV stable/gradient's stable.     reports that he quit smoking about 40 years ago. His smoking use included cigarettes. He started smoking about 52 years ago. He has a 12.00 pack-year smoking history. He has never used smokeless tobacco. He reports that he does not currently use alcohol. He reports that he does not use drugs.  Allergies  Allergen Reactions   Hydrocodone-Acetaminophen Shortness Of Breath   Oxycodone Hcl Shortness Of Breath   Telmisartan-Hctz Shortness Of Breath, Palpitations and Other (See Comments)    Dizziness also   Ramipril Other (See Comments)    Unknown reaction   Aspirin Rash and Other (See Comments)    Confusion, also- Cannot take high doses   Atorvastatin Other (See Comments)    Myalgia    Buspirone Hcl Other (See Comments)    Headaches    Codeine Hives, Nausea And Vomiting and Other (See Comments)    Confusion also   Morphine Sulfate Itching   Paroxetine Other (See Comments)    Paresthesias: R arm, R leg, r side of face   Rabeprazole Other (See Comments)    headache    Family History  Problem Relation Age of Onset   Cancer Sister        uterine   Colon cancer Sister 54   Stroke Mother    Prostate cancer Father    Stroke Sister    Stroke Brother    Heart attack Neg Hx      Prior to Admission medications   Medication Sig Start Date End Date Taking? Authorizing Provider  Cholecalciferol (VITAMIN D3) 50 MCG (2000 UT) capsule Take 2,000 Units by mouth every morning.   Yes [provider]  [employer] MOTION SICKNESS RELIEF 25 MG CHEW CHEW 1 TABLET EVERY DAY AS NEEDED FOR DIZZINESS Patient taking differently: Chew 25 mg by mouth every morning. 06/06/21  Yes [name] Olp, MD  docusate sodium (COLACE) 100 MG capsule Take 100 mg by mouth daily as needed (constipation).   Yes  [provider]  hydrochlorothiazide (HYDRODIURIL) 12.5 MG tablet TAKE 1 TABLET BY MOUTH EVERY DAY Patient taking  differently: Take 12.5 mg by mouth every morning. 04/04/21  Yes [name] Olp, MD  ipratropium (ATROVENT) 0.03 % nasal spray Place 2 sprays into both nostrils every 12 (twelve) hours. Patient taking differently: Place 2 sprays into both nostrils 2 (two) times daily as needed for rhinitis (congestion). 10/04/20  Yes Inda Coke, PA  LORazepam (ATIVAN) 0.5 MG tablet Take 1 tablet (0.5 mg total) by mouth at bedtime as needed for anxiety (can take 1/4 or 1/2 tablet  if effective.). Patient taking differently: Take 0.125-0.5 mg by mouth at bedtime as needed for anxiety. 08/02/21  Yes [name] Olp, MD  omeprazole (PRILOSEC) 40 MG capsule Take 40 mg by mouth every evening. 10/18/20  Yes [provider]  rosuvastatin (CRESTOR) 10 MG tablet TAKE 1 TABLET BY MOUTH EVERY DAY Patient taking differently: Take 10 mg by mouth at bedtime. 11/01/20  Yes [name] Olp, MD  sertraline (ZOLOFT) 100 MG tablet Take 100 mg by mouth every morning.   Yes [provider]  sodium chloride (OCEAN) 0.65 % SOLN nasal spray Place 1 spray into both nostrils as needed for congestion.   Yes [provider]  warfarin (COUMADIN) 5 MG tablet TAKE AS DIRECTED BY COUMADIN CLINIC Patient taking differently: Take 5-7.5 mg by mouth See admin instructions. Take 7.5 mg by mouth in the evening on Sun/Tues/Wed/Thurs/Fri/Sat and 5 mg on Mondays 03/19/21  Yes Josue Hector, MD  amoxicillin-clavulanate (AUGMENTIN) 875-125 MG tablet Take 1 tablet by mouth every 12 (twelve) hours. 08/18/21   Rayna Sexton, PA-C  venetoclax (VENCLEXTA) 100 MG tablet Take 1 tablet (100 mg total) by mouth daily. Tablets should be swallowed whole with a meal and a full glass of water. Patient not taking: No sig reported 07/05/21   Volanda Napoleon, MD    Physical Exam: Vitals:   08/19/21 1723 08/19/21  1745 08/19/21 1800 08/19/21 2200  BP:  139/81 (!) 142/80 118/68  Pulse:  85 84 72  Resp:  16 20 16   Temp: 100.2 F (37.9 C) (!) 102.1 F (38.9 C)    TempSrc: Oral Oral    SpO2:  100% 98% 98%  Weight: 86.2 kg     Height: 5\' 10"  (1.778 m)         Constitutional: NAD, calm, comfortable Vitals:   08/19/21 1723 08/19/21 1745 08/19/21 1800 08/19/21 2200  BP:  139/81 (!) 142/80 118/68  Pulse:  85 84 72  Resp:  16 20 16   Temp: 100.2 F (37.9 C) (!) 102.1 F (38.9 C)    TempSrc: Oral Oral    SpO2:  100% 98% 98%  Weight: 86.2 kg     Height: 5\' 10"  (1.778 m)      Eyes: PERRL, lids and conjunctivae normal ENMT: Mucous membranes are moist. Posterior pharynx clear of any exudate or lesions.Normal dentition.  Neck: normal, supple, no masses, no thyromegaly Respiratory: clear to auscultation bilaterally, no wheezing, no crackles. Normal respiratory effort. No accessory muscle use.  Cardiovascular: Regular rate and rhythm, no murmurs / rubs / gallops. No extremity edema. 2+ pedal pulses. No carotid bruits.  Abdomen: no tenderness, no masses palpated. No hepatosplenomegaly. Bowel sounds positive.  Musculoskeletal: no clubbing / cyanosis. No joint deformity upper and lower extremities. Good ROM, no contractures. Normal muscle tone.  Skin: no rashes, lesions, ulcers. No induration Neurologic: CN 2-12 grossly intact. Sensation intact, DTR normal. Strength 5/5 in all 4.  Psychiatric: Normal judgment and insight. Alert and oriented x 3. Normal mood.  Labs on Admission: I have personally reviewed following labs and imaging studies  CBC: Recent Labs  Lab 08/18/21 1900 08/19/21 1652  WBC 2.6* 2.7*  NEUTROABS 1.7 1.3*  HGB 12.0* 11.8*  HCT 36.7* 37.1*  MCV 94.8 96.6  PLT 110* 99*   Basic Metabolic Panel: Recent Labs  Lab 08/18/21 1900  NA 139  K 3.9  CL 106  CO2 24  GLUCOSE 127*  BUN 35*  CREATININE 2.06*  CALCIUM 9.5   GFR: Estimated Creatinine Clearance: 32 mL/min (A)  (by C-G formula based on SCr of 2.06 mg/dL (H)). Liver Function Tests: Recent Labs  Lab 08/18/21 1900  AST 24  ALT 19  ALKPHOS 76  BILITOT 0.7  PROT 6.9  ALBUMIN 4.0   No results for input(s): LIPASE, AMYLASE in the last 168 hours. No results for input(s): AMMONIA in the last 168 hours. Coagulation Profile: Recent Labs  Lab 08/13/21 1137 08/18/21 1900 08/19/21 1652  INR 2.7 2.5* 2.5*   Cardiac Enzymes: No results for input(s): CKTOTAL, CKMB, CKMBINDEX, TROPONINI in the last 168 hours. BNP (last 3 results) No results for input(s): PROBNP in the last 8760 hours. HbA1C: No results for input(s): HGBA1C in the last 72 hours. CBG: No results for input(s): GLUCAP in the last 168 hours. Lipid Profile: No results for input(s): CHOL, HDL, LDLCALC, TRIG, CHOLHDL, LDLDIRECT in the last 72 hours. Thyroid Function Tests: No results for input(s): TSH, T4TOTAL, FREET4, T3FREE, THYROIDAB in the last 72 hours. Anemia Panel: No results for input(s): VITAMINB12, FOLATE, FERRITIN, TIBC, IRON, RETICCTPCT in the last 72 hours. Urine analysis:    Component Value Date/Time   COLORURINE YELLOW 08/19/2021 1754   APPEARANCEUR CLEAR 08/19/2021 1754   LABSPEC 1.021 08/19/2021 1754   PHURINE 6.0 08/19/2021 1754   GLUCOSEU NEGATIVE 08/19/2021 1754   GLUCOSEU NEGATIVE 08/11/2017 0843   HGBUR MODERATE (A) 08/19/2021 1754   HGBUR negative 03/20/2010 0843   BILIRUBINUR NEGATIVE 08/19/2021 1754   BILIRUBINUR n 03/22/2011 0000   KETONESUR NEGATIVE 08/19/2021 1754   PROTEINUR 30 (A) 08/19/2021 1754   UROBILINOGEN 0.2 08/11/2017 0843   NITRITE NEGATIVE 08/19/2021 1754   LEUKOCYTESUR NEGATIVE 08/19/2021 1754   Sepsis Labs: @LABRCNTIP (procalcitonin:4,lacticidven:4) ) Recent Results (from the past 240 hour(s))  Culture, blood (Routine x 2)     Status: None (Preliminary result)   Collection Time: 08/18/21  6:03 PM   Specimen: BLOOD  Result Value Ref Range Status   Specimen Description   Final     BLOOD RIGHT ANTECUBITAL Performed at [facility], [place] [address], [place], [place] [address]    Special Requests   Final    BOTTLES DRAWN AEROBIC AND ANAEROBIC Blood Culture adequate volume Performed at [facility] [address], [place], [place] [address]    Culture   Final    NO GROWTH < 12 HOURS Performed at [facility] [address] [place], [place] [address]    Report Status PENDING  Incomplete  Resp Panel by RT-PCR (Flu A&B, Covid) Nasopharyngeal Swab     Status: None   Collection Time: 08/18/21  6:04 PM   Specimen: Nasopharyngeal Swab; Nasopharyngeal(NP) swabs in vial transport medium  Result Value Ref Range Status   SARS Coronavirus 2 by RT PCR NEGATIVE NEGATIVE Final    Comment: (NOTE) SARS-CoV-2 target nucleic acids are NOT DETECTED.  The SARS-CoV-2 RNA is generally detectable in upper respiratory specimens during the acute phase of infection. The lowest concentration of SARS-CoV-2 viral  copies this assay can detect is 138 copies/mL. A negative result does not preclude SARS-Cov-2 infection and should not be used as the sole basis for treatment or other patient management decisions. A negative result may occur with  improper specimen collection/handling, submission of specimen other than nasopharyngeal swab, presence of viral mutation(s) within the areas targeted by this assay, and inadequate number of viral copies(<138 copies/mL). A negative result must be combined with clinical observations, patient history, and epidemiological information. The expected result is Negative.  Fact Sheet for Patients:  EntrepreneurPulse.com.au  Fact Sheet for Healthcare Providers:  IncredibleEmployment.be  This test is no t yet approved or cleared by the [place] FDA and  has been authorized for detection and/or diagnosis of SARS-CoV-2 by FDA under an Emergency Use Authorization (EUA). This EUA  will remain  in effect (meaning this test can be used) for the duration of the COVID-19 declaration under Section 564(b)(1) of the Act, 21 U.S.C.section 360bbb-3(b)(1), unless the authorization is terminated  or revoked sooner.       Influenza A by PCR NEGATIVE NEGATIVE Final   Influenza B by PCR NEGATIVE NEGATIVE Final    Comment: (NOTE) The Xpert Xpress SARS-CoV-2/FLU/RSV plus assay is intended as an aid in the diagnosis of influenza from Nasopharyngeal swab specimens and should not be used as a sole basis for treatment. Nasal washings and aspirates are unacceptable for Xpert Xpress SARS-CoV-2/FLU/RSV testing.  Fact Sheet for Patients: EntrepreneurPulse.com.au  Fact Sheet for Healthcare Providers: IncredibleEmployment.be  This test is not yet approved or cleared by the [place] FDA and has been authorized for detection and/or diagnosis of SARS-CoV-2 by FDA under an Emergency Use Authorization (EUA). This EUA will remain in effect (meaning this test can be used) for the duration of the COVID-19 declaration under Section 564(b)(1) of the Act, 21 U.S.C. section 360bbb-3(b)(1), unless the authorization is terminated or revoked.  Performed at [facility], [place] [address] [place], [place] [address]   Culture, blood (Routine x 2)     Status: None (Preliminary result)   Collection Time: 08/18/21  6:08 PM   Specimen: BLOOD  Result Value Ref Range Status   Specimen Description   Final    BLOOD BLOOD LEFT FOREARM Performed at [facility] [address] [place], [place] [address]    Special Requests   Final    BOTTLES DRAWN AEROBIC AND ANAEROBIC Blood Culture adequate volume Performed at [facility] [address], [place], [place] [address]    Culture   Final    NO GROWTH < 12 HOURS Performed at [facility] [address], [place],  [address]    Report Status  PENDING  Incomplete     Radiological Exams on Admission: DG Chest 2 View  Result Date: 08/18/2021 CLINICAL DATA:  Short of breath and fever, suspected sepsis EXAM: CHEST - 2 VIEW COMPARISON:  07/08/2021 FINDINGS: Frontal and lateral views of the chest demonstrates stable postsurgical changes from aortic valve replacement. Cardiac silhouette is unremarkable. No airspace disease, effusion, or pneumothorax. No acute bony abnormalities. IMPRESSION: 1. No acute intrathoracic process. Electronically Signed   By: Randa Ngo M.D.   On: 08/18/2021 18:42   DG Chest Portable 1 View  Result Date: 08/19/2021 CLINICAL DATA:  Fever. EXAM: PORTABLE CHEST 1 VIEW COMPARISON:  August 18, 2021 FINDINGS: The heart size and mediastinal contours are stable. Both lungs are clear. The visualized skeletal structures are unremarkable. IMPRESSION: No active disease.  Electronically Signed   By: Abelardo Diesel M.D.   On: 08/19/2021 16:29     Assessment/Plan Principal Problem:   Febrile neutropenia (HCC) Active Problems:   HLD (hyperlipidemia)   Anxiety state   Essential hypertension   Atrial fibrillation (HCC)   GERD   Chronic lymphocytic leukemia (HCC)   S/P Bioprosthetic AVR (aortic valve replacement) in 2011   CKD (chronic kidney disease), stage III ([place])   History of pulmonary embolism     #1 febrile neutropenia: Patient will be admitted and initiated on vancomycin and cefepime per protocol.  Blood cultures obtained.  Monitor closely.  Oncology to follow.  #2 CLL: Continue treatment per oncology.  #3 anxiety state: Resume home regimen.  Continue Ativan.  Continue Zoloft 100 mg daily.  #4 atrial fibrillation: Fully anticoagulated and rate is controlled.  Continue home regimen.  Continue warfarin  #5 essential hypertension: Continue hydrochlorothiazide.  #6 hyperlipidemia: Continue Crestor.  #7 anemia and thrombocytopenia: Secondary to chemotherapy.  Continue to monitor   DVT prophylaxis:  Warfarin Code Status: Full code Family Communication: No family at bedside Disposition Plan: Home Consults called: Dr. Marin Bell Admission status: Inpatient  Severity of Illness: The appropriate patient status for this patient is INPATIENT. Inpatient status is judged to be reasonable and necessary in order to provide the required intensity of service to ensure the patient's safety. The patient's presenting symptoms, physical exam findings, and initial radiographic and laboratory data in the context of their chronic comorbidities is felt to place them at high risk for further clinical deterioration. Furthermore, it is not anticipated that the patient will be medically stable for discharge from the hospital within 2 midnights of admission. The following factors support the patient status of inpatient.   " The patient's presenting symptoms include fever and weakness. " The worrisome physical exam findings include no acute findings. " The initial radiographic and laboratory data are worrisome because of neutropenia and thrombocytopenia. " The chronic co-morbidities include CLL.   * I certify that at the point of admission it is my clinical judgment that the patient will require inpatient hospital care spanning beyond 2 midnights from the point of admission due to high intensity of service, high risk for further deterioration and high frequency of surveillance required.Barbette Merino MD [facility] Pager [phone]  If 7PM-7AM, please contact night-coverage [email or web address] Password [facility]  08/19/2021, 11:37 PM

## 2021-08-19 NOTE — ED Provider Notes (Signed)
Emergency Department Provider Note   I have reviewed the triage vital signs and the nursing notes.   HISTORY  Chief Complaint No chief complaint on file.   HPI [name] is a 75 y.o. male with past medical history reviewed below including CLL followed by [name] returns to the emergency department with rigors, weakness, body aches, headaches.  He was seen yesterday in the emergency department with fevers and rigors but work-up at that time did not show a clear source for infection.  He is on Coumadin and oral chemotherapy (Calquence and Venetoclax).  Blood and urine cultures were sent yesterday the patient was discharged with a prescription for Augmentin but the pharmacy was unable to fill it soon enough for him to start prior to returning to the emergency department.  Patient states mainly today has been feeling lightheaded with standing.  He does have persistent, mild, headache along with continued shaking chills.  He has not recorded fevers today at home.    Past Medical History:  Diagnosis Date   Anticoagulants causing adverse effect in therapeutic use    Anxiety    Paxil seemed to cause odd neuro side effects; better on prozac as of 09/2015   Aortic regurgitation 04/2007 surgery   Resolved with tissue AVR at [facility]   Cervical spondylosis    ESI by [name] in [place]. 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 (most recent 07/2017)   History of prostate cancer 2003   Rad prost   History of pulmonary embolism 2011; 10/2014   Recurrence off of anticoagulation 10/2014: per hem/onc ([name]) pt needs lifelong anticoagulation (hypercoag w/u neg).   Hyperlipidemia    statin-intolerant, except for crestor low dose.   Hypertension    Migraine syndrome    Mitral regurgitation    PAF (paroxysmal atrial fibrillation) (HCC)    Panic attacks    "           "             "                  "                   "                       "                             "                          Pulmonary nodule, right 2015   RLL: resected at [facility] --VATS; Benign RLL nodule (fungal and AFB stains neg).  Plan is for [facility] thoracic surg to do f/u o/v & CT chest 1 yr    Right-sided headache 07/2016   Primary stabbing HA or migraine per neuro (Dr. Tomi Likens).  Gabapentin and topamax not tolerated.  These resolved spontaneously.    Patient Active Problem List   Diagnosis Date Noted   Febrile neutropenia ([place]) 08/19/2021   Aortic atherosclerosis ([place]) 07/25/2021   Pneumonia of right lower lobe due to infectious organism    Neutropenic fever (HCC)    FUO (fever of unknown origin)    Pancytopenia ([place])    Positive blood  culture 07/09/2021   Anticoagulants causing adverse effect in therapeutic use 06/22/2021   Encounter for therapeutic drug monitoring 09/21/2019   PTSD (post-traumatic stress disorder) 09/29/2018   BPPV (benign paroxysmal positional vertigo) 09/04/2017   CKD (chronic kidney disease), stage III ([place]) 08/11/2017   Former smoker 08/29/2016   Cervical spondylosis    S/P Bioprosthetic AVR (aortic valve replacement) in 2011 03/22/2015   History of pulmonary embolism 03/07/2015   Recurrent pulmonary embolism ([place]) 11/09/2014   Lung nodule 11/02/2014   Chronic lymphocytic leukemia ([place]) 11/11/2013   Migraine 10/08/2010   GERD 12/11/2009   MITRAL REGURGITATION 04/07/2009   Insomnia 02/03/2008   Anxiety state 09/06/2007   HLD (hyperlipidemia) 04/20/2007   Essential hypertension 04/20/2007   Atrial fibrillation ([place]) 04/20/2007   PROSTATE CANCER, HX OF 04/20/2007    Past Surgical History:  Procedure Laterality Date   AORTIC VALVE REPLACEMENT  2011   [place]  (bioprosthetic)   CARDIAC CATHETERIZATION  04/2010   Normal coronaries.   Carotid dopplers  08/2015   NORMAL   CATARACT EXTRACTION     L 12/06/09   R 09/14/09   COLONOSCOPY  01/26/13   tic's, o/w normal.  ([place])--recall 10  yrs.   PENILE PROSTHESIS IMPLANT     PROSTATECTOMY  04/2002   for prostate cancer   Pulmonary nodule resection  Fall/winter 2016   [facility]   SHOULDER SURGERY     rotator cuff on both arms    TEE WITHOUT CARDIOVERSION N/A 07/13/2021   Procedure: TRANSESOPHAGEAL ECHOCARDIOGRAM (TEE);  Surgeon: Pixie Casino, MD;  Location: [facility] ENDOSCOPY;  Service: Cardiovascular;  Laterality: N/A;   TIBIALIS TENDON TRANSFER / REPAIR Right 03/02/2019   Procedure: RECONSTRUCTION OF RIGHT  ANTERIOR TIBIALIS TENDON;  Surgeon: Erle Crocker, MD;  Location: [place];  Service: Orthopedics;  Laterality: Right;   TRANSTHORACIC ECHOCARDIOGRAM  09/20/2014; 03/2016; 03/2017   2015-mild LVH, EF 50-55%, wall motion nl, grade I diast dysfxn, prosth aort valve good,transaortic gradients decreased compared to echo 11/2013.   03/2016: EF 55-60%, normal LV function, severe LVH, normal diast fxn, mild increase in AV gradient compared to 09/2014 echo.  2018: EF 55-60%, normal LV fxn, grd II DD, AV stable/gradient's stable.    Allergies Hydrocodone-acetaminophen, Oxycodone hcl, Telmisartan-hctz, Ramipril, Aspirin, Atorvastatin, Buspirone hcl, Codeine, Morphine sulfate, Paroxetine, and Rabeprazole  Family History  Problem Relation Age of Onset   Cancer Sister        uterine   Colon cancer Sister 7   Stroke Mother    Prostate cancer Father    Stroke Sister    Stroke Brother    Heart attack Neg Hx     Social History Social History   Tobacco Use   Smoking status: Former    Packs/day: 1.00    Years: 12.00    Pack years: 12.00    Types: Cigarettes    Start date: 11/28/1968    Quit date: 01/29/1981    Years since quitting: 40.5   Smokeless tobacco: Never   Tobacco comments:    quit 75 yo   Vaping Use   Vaping Use: Never used  Substance Use Topics   Alcohol use: Not Currently    Comment: occasional   Drug use: No    Review of Systems  Constitutional: Positive fever/chills and rigors.  Eyes: No  visual changes. ENT: No sore throat. Positive chronic congestion.  Cardiovascular: Denies chest pain. Respiratory: Denies shortness of breath. Gastrointestinal: No abdominal pain.  No nausea, no vomiting.  No diarrhea.  No constipation. Genitourinary: Negative for dysuria. Musculoskeletal: Positive body aches.  Skin: Negative for rash. Neurological: Negative for focal weakness or numbness. Positive HA.  10-point ROS otherwise negative.  ____________________________________________   PHYSICAL EXAM:  VITAL SIGNS: ED Triage Vitals  Enc Vitals Group     BP 08/19/21 1524 129/80     Pulse Rate 08/19/21 1524 92     Resp 08/19/21 1524 19     Temp 08/19/21 1524 98.6 F (37 C)     Temp Source 08/19/21 1524 Oral     SpO2 08/19/21 1524 99 %   Constitutional: Alert and oriented. Well appearing without confusion but shaking chills.  Eyes: Conjunctivae are normal.  Head: Atraumatic. Nose: No congestion/rhinnorhea. Mouth/Throat: Mucous membranes are moist.  Oropharynx non-erythematous. Neck: No stridor.   Cardiovascular: Normal rate, regular rhythm. Good peripheral circulation. Grossly normal heart sounds.   Respiratory: Normal respiratory effort.  No retractions. Lungs CTAB. Gastrointestinal: Soft and nontender. No distention.  Musculoskeletal: No lower extremity tenderness nor edema. No gross deformities of extremities. Neurologic:  Normal speech and language. No gross focal neurologic deficits are appreciated.  Skin:  Skin is warm, dry and intact. No rash noted.   ____________________________________________   LABS (all labs ordered are listed, but only abnormal results are displayed)  Labs Reviewed  CBC WITH DIFFERENTIAL/PLATELET - Abnormal; Notable for the following components:      Result Value   WBC 2.7 (*)    RBC 3.84 (*)    Hemoglobin 11.8 (*)    HCT 37.1 (*)    Platelets 99 (*)    Neutro Abs 1.3 (*)    All other components within normal limits  LACTIC ACID, PLASMA -  Abnormal; Notable for the following components:   Lactic Acid, Venous 4.7 (*)    All other components within normal limits  PROTIME-INR - Abnormal; Notable for the following components:   Prothrombin Time 27.2 (*)    INR 2.5 (*)    All other components within normal limits  CULTURE, BLOOD (ROUTINE X 2)  CULTURE, BLOOD (ROUTINE X 2)  URINE CULTURE  LACTIC ACID, PLASMA  URINALYSIS, ROUTINE W REFLEX MICROSCOPIC  COMPREHENSIVE METABOLIC PANEL  LIPASE, BLOOD   ____________________________________________  RADIOLOGY  DG Chest Portable 1 View  Result Date: 08/19/2021 CLINICAL DATA:  Fever. EXAM: PORTABLE CHEST 1 VIEW COMPARISON:  August 18, 2021 FINDINGS: The heart size and mediastinal contours are stable. Both lungs are clear. The visualized skeletal structures are unremarkable. IMPRESSION: No active disease. Electronically Signed   By: Abelardo Diesel M.D.   On: 08/19/2021 16:29    ____________________________________________   PROCEDURES  Procedure(s) performed:   Procedures  CRITICAL CARE Performed by: Margette Fast Total critical care time: 35 minutes Critical care time was exclusive of separately billable procedures and treating other patients. Critical care was necessary to treat or prevent imminent or life-threatening deterioration. Critical care was time spent personally by me on the following activities: development of treatment plan with patient and/or surrogate as well as nursing, discussions with consultants, evaluation of patient's response to treatment, examination of patient, obtaining history from patient or surrogate, ordering and performing treatments and interventions, ordering and review of laboratory studies, ordering and review of radiographic studies, pulse oximetry and re-evaluation of patient's condition.  Nanda Quinton, MD Emergency Medicine  ____________________________________________   INITIAL IMPRESSION / ASSESSMENT AND PLAN / ED COURSE  Pertinent  labs & imaging results that were available during my care of the patient were  reviewed by me and considered in my medical decision making (see chart for details).   Patient presents emergency department with CLL on oral chemotherapy with fever of unknown origin and rigors.  Afebrile here with normal vital signs but is having active rigors here in the department on my initial exam.  Notes from yesterday and labs reviewed.  No blood or urine cultures have resulted from yesterday's visit.  Will redraw cultures and labs and give IV fluids.  COVID and flu testing were negative yesterday.  We will reach out to oncology for recommendations.   Spoke with [name]. Plan for abx. They will consult in the AM.   06:10 PM  Labs reviewed.  Patient's neutrophil count of 1.3 slightly worse from yesterday.  Surprisingly, the patient's lactic acid has come back at 4.7.  He does not have SIRS vitals other than fever.  Patient has already received antibiotics.  Clinically suspect bacteremia from neutropenia.  Will add additional IV fluid with elevated lactate and admit.   Discussed patient's case with [facility] to request admission. Patient and family (if present) updated with plan. Care transferred to [facility] service.  I reviewed all nursing notes, vitals, pertinent old records, EKGs, labs, imaging (as available).  ____________________________________________  FINAL CLINICAL IMPRESSION(S) / ED DIAGNOSES  Final diagnoses:  Neutropenic fever ([place])     MEDICATIONS GIVEN DURING THIS VISIT:  Medications  ceFEPIme (MAXIPIME) 2 g in sodium chloride 0.9 % 100 mL IVPB (0 g Intravenous Stopped 08/19/21 1751)  acetaminophen (TYLENOL) tablet 1,000 mg (has no administration in time range)  sodium chloride 0.9 % bolus 1,000 mL (has no administration in time range)    And  sodium chloride 0.9 % bolus 1,000 mL (has no administration in time range)  sodium chloride 0.9 % bolus 500 mL (0 mLs Intravenous Stopped 08/19/21 1751)     Note:  This document was prepared using Dragon voice recognition software and may include unintentional dictation errors.  Nanda Quinton, MD, [facility] Emergency Medicine    Kaydance Bowie, [facility], MD 08/19/21 [phone]

## 2021-08-19 NOTE — ED Triage Notes (Signed)
Patient complains of Ha and chills, states he was discharged yesterday and felt great but when he woke up this AM he had chills, HA, and malaise. Has CLL.

## 2021-08-20 ENCOUNTER — Encounter (HOSPITAL_COMMUNITY): Payer: Self-pay | Admitting: Internal Medicine

## 2021-08-20 DIAGNOSIS — C9011 Plasma cell leukemia in remission: Secondary | ICD-10-CM

## 2021-08-20 DIAGNOSIS — D709 Neutropenia, unspecified: Secondary | ICD-10-CM | POA: Diagnosis not present

## 2021-08-20 DIAGNOSIS — R5081 Fever presenting with conditions classified elsewhere: Secondary | ICD-10-CM | POA: Diagnosis not present

## 2021-08-20 LAB — COMPREHENSIVE METABOLIC PANEL
ALT: 17 U/L (ref 0–44)
AST: 25 U/L (ref 15–41)
Albumin: 3.5 g/dL (ref 3.5–5.0)
Alkaline Phosphatase: 58 U/L (ref 38–126)
Anion gap: 7 (ref 5–15)
BUN: 20 mg/dL (ref 8–23)
CO2: 25 mmol/L (ref 22–32)
Calcium: 8.5 mg/dL — ABNORMAL LOW (ref 8.9–10.3)
Chloride: 103 mmol/L (ref 98–111)
Creatinine, Ser: 1.42 mg/dL — ABNORMAL HIGH (ref 0.61–1.24)
GFR, Estimated: 52 mL/min — ABNORMAL LOW (ref >60)
Glucose, Bld: 94 mg/dL (ref 70–99)
Potassium: 3.7 mmol/L (ref 3.5–5.1)
Sodium: 135 mmol/L (ref 135–145)
Total Bilirubin: 1 mg/dL (ref 0.3–1.2)
Total Protein: 6 g/dL — ABNORMAL LOW (ref 6.5–8.1)

## 2021-08-20 LAB — LIPASE, BLOOD: Lipase: 44 U/L (ref 11–51)

## 2021-08-20 LAB — LACTIC ACID, PLASMA: Lactic Acid, Venous: 0.8 mmol/L (ref 0.5–1.9)

## 2021-08-20 MED ORDER — ONDANSETRON HCL 4 MG/2ML IJ SOLN: 4.0000 mg | Freq: Four times a day (QID) | INTRAMUSCULAR | Status: DC | PRN

## 2021-08-20 MED ORDER — TBO-FILGRASTIM 480 MCG/0.8ML ~~LOC~~ SOSY
480.0000 ug | PREFILLED_SYRINGE | Freq: Once | SUBCUTANEOUS | Status: AC
  Administered 2021-08-20: 480 ug via SUBCUTANEOUS
  Filled 2021-08-20 (×2): qty 0.8

## 2021-08-20 MED ORDER — LACTATED RINGERS IV SOLN: INTRAVENOUS | Status: DC

## 2021-08-20 MED ORDER — ACETAMINOPHEN 650 MG RE SUPP: 650.0000 mg | Freq: Four times a day (QID) | RECTAL | Status: DC | PRN

## 2021-08-20 MED ORDER — ONDANSETRON HCL 4 MG PO TABS: 4.0000 mg | ORAL_TABLET | Freq: Four times a day (QID) | ORAL | Status: DC | PRN

## 2021-08-20 MED ORDER — WARFARIN - PHARMACIST DOSING INPATIENT: Freq: Every day | Status: DC

## 2021-08-20 MED ORDER — ACETAMINOPHEN 325 MG PO TABS
650.0000 mg | ORAL_TABLET | Freq: Four times a day (QID) | ORAL | Status: DC | PRN
  Administered 2021-08-20: 650 mg via ORAL
  Filled 2021-08-20: qty 2

## 2021-08-20 MED ORDER — WARFARIN SODIUM 5 MG PO TABS
7.5000 mg | ORAL_TABLET | Freq: Once | ORAL | Status: AC
  Administered 2021-08-20: 7.5 mg via ORAL
  Filled 2021-08-20: qty 1

## 2021-08-20 MED ORDER — FILGRASTIM 480 MCG/1.6ML IJ SOLN: 480.0000 ug | Freq: Every day | INTRAMUSCULAR | Status: DC

## 2021-08-20 NOTE — Progress Notes (Signed)
ANTICOAGULATION CONSULT NOTE - Initial Consult  Pharmacy Consult for warfarin Indication: atrial fibrillation  Allergies  Allergen Reactions   Hydrocodone-Acetaminophen Shortness Of Breath   Oxycodone Hcl Shortness Of Breath   Telmisartan-Hctz Shortness Of Breath, Palpitations and Other (See Comments)    Dizziness also   Ramipril Other (See Comments)    Unknown reaction   Aspirin Rash and Other (See Comments)    Confusion, also- Cannot take high doses   Atorvastatin Other (See Comments)    Myalgia    Buspirone Hcl Other (See Comments)    Headaches    Codeine Hives, Nausea And Vomiting and Other (See Comments)    Confusion also   Morphine Sulfate Itching   Paroxetine Other (See Comments)    Paresthesias: R arm, R leg, r side of face   Rabeprazole Other (See Comments)    headache    Patient Measurements: Height: 5\' 10"  (177.8 cm) Weight: 86.2 kg (190 lb) IBW/kg (Calculated) : 73   Vital Signs: Temp: 102.1 F (38.9 C) (10/09 1745) Temp Source: Oral (10/09 1745) BP: 118/68 (10/09 2200) Pulse Rate: 72 (10/09 2200)  Labs: Recent Labs    08/18/21 1900 08/19/21 1652  HGB 12.0* 11.8*  HCT 36.7* 37.1*  PLT 110* 99*  LABPROT 27.3* 27.2*  INR 2.5* 2.5*  CREATININE 2.06*  --     Estimated Creatinine Clearance: 32 mL/min (A) (by C-G formula based on SCr of 2.06 mg/dL (H)).   Medical History: Past Medical History:  Diagnosis Date   Anticoagulants causing adverse effect in therapeutic use    Anxiety    Paxil seemed to cause odd neuro side effects; better on prozac as of 09/2015   Aortic regurgitation 04/2007 surgery   Resolved with tissue AVR at [facility]   Cervical spondylosis    ESI by [name] in [place]. 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 (most recent 07/2017)   History of prostate cancer 2003   Rad prost   History of pulmonary embolism 2011; 10/2014   Recurrence off of  anticoagulation 10/2014: per hem/onc ([name]) pt needs lifelong anticoagulation (hypercoag w/u neg).   Hyperlipidemia    statin-intolerant, except for crestor low dose.   Hypertension    Migraine syndrome    Mitral regurgitation    PAF (paroxysmal atrial fibrillation) (HCC)    Panic attacks    "           "             "                  "                  "                       "                             "                          Pulmonary nodule, right 2015   RLL: resected at [facility] --VATS; Benign RLL nodule (fungal and AFB stains neg).  Plan is for [facility] thoracic surg to do f/u o/v & CT chest 1 yr    Right-sided headache 07/2016  Primary stabbing HA or migraine per neuro (Dr. Tomi Likens).  Gabapentin and topamax not tolerated.  These resolved spontaneously.    Medications:  Warfarin: takes 7.5mg  on Mondays and 5mg  on all other days  Assessment: 75 y.o. male with medical history significant of CLL on chemotherapy, chronic anticoagulation from pulmonary embolism and atrial fibrillation, essential hypertension, pulmonary nodule, history of prostate cancer s/p radiation therapy, hyperlipidemia who presented to the ER with weakness of reported fever at home. Pharmacy consulted to dose warfarin.  INR 2.5 Hgb 11.8, Plts 99, Scr 2.06  Goal of Therapy:  INR 2-3    Plan:  Warfarin 7.5mg  po today at 1600 Daily INR  [place] 08/20/2021, 12:06 AM

## 2021-08-20 NOTE — Progress Notes (Addendum)
Progress Note:    [name]    EXN:[id number] DOB: 01/17/1946 DOA: 08/19/2021  PCP: [name], MD    Brief Narrative:   75 year old male with a past medical history significant for CLL, chronic anticoagulation for pulmonary embolism and atrial fibrillation, essential hypertension, pulmonary nodule, history of prostate cancer s/p radiation therapy, hyperlipidemia.  She presented to the ER with generalized weakness and fevers at home.  Found to be neutropenic and admitted for neutropenic fevers.  Temperatures as high as 102 F at home.     Subjective:   No acute events overnight.  Patient seen and examined at bedside this morning.  Low-grade fever 100.3 F at 3 AM this morning.  Denies any nausea vomiting abdominal pain diarrhea.  No cough or shortness of breath.  Denies any dysuria.    Assessment and Plan:   Febrile neutropenia: Continue cefepime per protocol.  Blood cultures negative to date.  Oncology following in consultation.  They have ordered a dose of Neupogen this morning.  Chest x-ray was negative.  Urinalysis unremarkable.  CLL: Per oncology  Anxiety: Continue home Ativan and Zoloft  Atrial fibrillation: Controlled.  INR therapeutic.  Continue warfarin  Hypertension: Well-controlled.  Continue home HCTZ  Dyslipidemia: Continue home Crestor  CKD3: Appears at baseline.    Other information:    DVT prophylaxis: Warfarin Code Status: Full code Family Communication: No family present at bedside Disposition:   Status is: Inpatient  Remains inpatient appropriate because:Inpatient level of care appropriate due to severity of illness  Dispo: The patient is from: Home              Anticipated d/c is to: Home              Patient currently is not medically stable to d/c.   Difficult to place patient No           Consultants:   Oncology    Objective:    Vitals:   08/19/21 2200 08/20/21 0015 08/20/21 0343 08/20/21 0936  BP:  118/68 110/60 (!) 152/85 105/66  Pulse: 72 66 87 78  Resp: 16 17 19 18   Temp:   100.3 F (37.9 C) 99.5 F (37.5 C)  TempSrc:   Oral Oral  SpO2: 98% 98% 98% 95%  Weight:   85.3 kg   Height:        Intake/Output Summary (Last 24 hours) at 08/20/2021 1348 Last data filed at 08/20/2021 0900 Gross per 24 hour  Intake 3060.41 ml  Output --  Net 3060.41 ml   Filed Weights   08/19/21 1723 08/20/21 0343  Weight: 86.2 kg 85.3 kg       Physical Exam:    General exam: Appears calm and comfortable  Respiratory system: Clear to auscultation. Respiratory effort normal. Cardiovascular system: S1 & S2 heard, RRR. No JVD, murmurs, rubs, gallops or clicks. No pedal edema. Gastrointestinal system: Abdomen is nondistended, soft and nontender. No organomegaly or masses felt. Normal bowel sounds heard. Central nervous system: Alert and oriented. No focal neurological deficits. Extremities: Symmetric 5 x 5 power. Skin: No rashes, lesions or ulcers Psychiatry: Judgement and insight appear normal. Mood & affect appropriate.     Data Reviewed:    I have personally reviewed following labs and imaging studies  CBC: Recent Labs  Lab 08/18/21 1900 08/19/21 1652  WBC 2.6* 2.7*  NEUTROABS 1.7 1.3*  HGB 12.0* 11.8*  HCT 36.7* 37.1*  MCV 94.8 96.6  PLT 110* 99*  Basic Metabolic Panel: Recent Labs  Lab 08/18/21 1900 08/20/21 0340  NA 139 135  K 3.9 3.7  CL 106 103  CO2 24 25  GLUCOSE 127* 94  BUN 35* 20  CREATININE 2.06* 1.42*  CALCIUM 9.5 8.5*    GFR: Estimated Creatinine Clearance: 46.4 mL/min (A) (by C-G formula based on SCr of 1.42 mg/dL (H)).  Liver Function Tests: Recent Labs  Lab 08/18/21 1900 08/20/21 0340  AST 24 25  ALT 19 17  ALKPHOS 76 58  BILITOT 0.7 1.0  PROT 6.9 6.0*  ALBUMIN 4.0 3.5    CBG: No results for input(s): GLUCAP in the last 168 hours.   Recent Results (from the past 240 hour(s))  Culture, blood (Routine x 2)     Status: None  (Preliminary result)   Collection Time: 08/18/21  6:03 PM   Specimen: BLOOD  Result Value Ref Range Status   Specimen Description   Final    BLOOD RIGHT ANTECUBITAL Performed at [facility] [address], [place], [place] [address]    Special Requests   Final    BOTTLES DRAWN AEROBIC AND ANAEROBIC Blood Culture adequate volume Performed at [facility] [address] [place], [place] [address]    Culture   Final    NO GROWTH 2 DAYS Performed at [facility] [address] [place], [place] [address]    Report Status PENDING  Incomplete  Resp Panel by RT-PCR (Flu A&B, Covid) Nasopharyngeal Swab     Status: None   Collection Time: 08/18/21  6:04 PM   Specimen: Nasopharyngeal Swab; Nasopharyngeal(NP) swabs in vial transport medium  Result Value Ref Range Status   SARS Coronavirus 2 by RT PCR NEGATIVE NEGATIVE Final    Comment: (NOTE) SARS-CoV-2 target nucleic acids are NOT DETECTED.  The SARS-CoV-2 RNA is generally detectable in upper respiratory specimens during the acute phase of infection. The lowest concentration of SARS-CoV-2 viral copies this assay can detect is 138 copies/mL. A negative result does not preclude SARS-Cov-2 infection and should not be used as the sole basis for treatment or other patient management decisions. A negative result may occur with  improper specimen collection/handling, submission of specimen other than nasopharyngeal swab, presence of viral mutation(s) within the areas targeted by this assay, and inadequate number of viral copies(<138 copies/mL). A negative result must be combined with clinical observations, patient history, and epidemiological information. The expected result is Negative.  Fact Sheet for Patients:  EntrepreneurPulse.com.au  Fact Sheet for Healthcare Providers:  IncredibleEmployment.be  This test is no t yet approved or cleared by the [place] FDA and  has been authorized for detection and/or diagnosis of SARS-CoV-2 by FDA under an Emergency Use Authorization (EUA). This EUA will remain  in effect (meaning this test can be used) for the duration of the COVID-19 declaration under Section 564(b)(1) of the Act, 21 U.S.C.section 360bbb-3(b)(1), unless the authorization is terminated  or revoked sooner.       Influenza A by PCR NEGATIVE NEGATIVE Final   Influenza B by PCR NEGATIVE NEGATIVE Final    Comment: (NOTE) The Xpert Xpress SARS-CoV-2/FLU/RSV plus assay is intended as an aid in the diagnosis of influenza from Nasopharyngeal swab specimens and should not be used as a sole basis for treatment. Nasal washings and aspirates are unacceptable for Xpert Xpress SARS-CoV-2/FLU/RSV testing.  Fact Sheet for Patients: EntrepreneurPulse.com.au  Fact Sheet for Healthcare Providers: IncredibleEmployment.be  This test is not yet approved or  cleared by the [place] and has been authorized for detection and/or diagnosis of SARS-CoV-2 by FDA under an Emergency Use Authorization (EUA). This EUA will remain in effect (meaning this test can be used) for the duration of the COVID-19 declaration under Section 564(b)(1) of the Act, 21 U.S.C. section 360bbb-3(b)(1), unless the authorization is terminated or revoked.  Performed at [facility], [place] [address] [place], [place] [address]   Culture, blood (Routine x 2)     Status: None (Preliminary result)   Collection Time: 08/18/21  6:08 PM   Specimen: BLOOD  Result Value Ref Range Status   Specimen Description   Final    BLOOD BLOOD LEFT FOREARM Performed at [facility] [address], [place], [place] [address]    Special Requests   Final    BOTTLES DRAWN AEROBIC AND ANAEROBIC Blood Culture adequate volume Performed at [facility] [address], [place], [place]  [address]    Culture   Final    NO GROWTH 2 DAYS Performed at [facility] [address], [place], [place] [address]    Report Status PENDING  Incomplete  Culture, blood (routine x 2)     Status: None (Preliminary result)   Collection Time: 08/19/21  4:05 PM   Specimen: BLOOD  Result Value Ref Range Status   Specimen Description   Final    BLOOD BLOOD RIGHT FOREARM Performed at [facility] [address], [place], [place] [address]    Special Requests   Final    BOTTLES DRAWN AEROBIC AND ANAEROBIC Blood Culture results may not be optimal due to an inadequate volume of blood received in culture bottles Performed at [facility] [address], [place], [place] [address]    Culture   Final    NO GROWTH < 12 HOURS Performed at [facility] [address], [place], [place] [address]    Report Status PENDING  Incomplete  Culture, blood (routine x 2)     Status: None (Preliminary result)   Collection Time: 08/19/21  4:52 PM   Specimen: BLOOD  Result Value Ref Range Status   Specimen Description   Final    BLOOD RIGHT ANTECUBITAL Performed at [facility] [address] [place], [place] [address]    Special Requests   Final    BOTTLES DRAWN AEROBIC AND ANAEROBIC Blood Culture results may not be optimal due to an inadequate volume of blood received in culture bottles Performed at [facility] [address], [place], [place] [address]    Culture   Final    NO GROWTH < 12 HOURS Performed at [facility] [address] [place], [place] [address]    Report Status PENDING  Incomplete         Radiology Studies:    DG Chest 2 View  Result Date: 08/18/2021 CLINICAL DATA:  Short of breath and fever, suspected sepsis EXAM: CHEST - 2 VIEW COMPARISON:  07/08/2021 FINDINGS: Frontal and lateral views of the chest demonstrates stable postsurgical changes from aortic valve replacement.  Cardiac silhouette is unremarkable. No airspace disease, effusion, or pneumothorax. No acute bony abnormalities. IMPRESSION: 1. No acute intrathoracic process. Electronically Signed   By: Randa Ngo M.D.   On: 08/18/2021 18:42   DG Chest Portable 1 View  Result Date: 08/19/2021 CLINICAL DATA:  Fever. EXAM: PORTABLE CHEST 1 VIEW COMPARISON:  August 18, 2021 FINDINGS: The heart size and mediastinal contours are stable. Both lungs are clear. The visualized skeletal structures are unremarkable. IMPRESSION: No active disease. Electronically Signed   By: Abelardo Diesel M.D.   On: 08/19/2021 16:29        Medications:    Scheduled Meds:  cholecalciferol  2,000 Units Oral q morning   hydrochlorothiazide  12.5 mg Oral q morning   pantoprazole  40 mg Oral Daily   rosuvastatin  10 mg Oral QHS   sertraline  100 mg Oral q morning   Tbo-filgastrim (GRANIX) SQ  480 mcg Subcutaneous ONCE-1800   warfarin  7.5 mg Oral ONCE-1600   Warfarin - Pharmacist Dosing Inpatient   Does not apply q1600   Continuous Infusions:  ceFEPime (MAXIPIME) IV Stopped (08/20/21 0559)   lactated ringers 75 mL/hr at 08/20/21 0336       LOS: 1 day    Time spent: 35 minutes    Leslee Home, MD [facility]   To contact the attending provider between 7A-7P or the covering provider during after hours 7P-7A, please log into the web site [email or web address] and access using universal [facility] password for that web site. If you do not have the password, please call the hospital operator.  08/20/2021, 1:48 PM

## 2021-08-20 NOTE — Consult Note (Signed)
@LOGO @ Hematology and Oncology Follow Up Visit  [name] [id number] October 02, 1946 75 y.o. 08/20/2021   Principle Diagnosis:  Neutropenia with fever; treated CLL  Current Therapy:   Observation     Interim History:  [name] is admitted.  He came in yesterday.  Had a temperature 102.1.  He was neutropenic.  He started having some shakes on Saturday.  It stopped.  He did not note any temperature.  There is no cough.  He had no diarrhea.  He had no dysuria.  He went to the emergency room on 08/18/2021.  His white cell count was 2.6.  Hemoglobin 12.  Platelet count 110,000.  His ANC was 1.7.  Yesterday, his white cell count was 2.7.  Hemoglobin 11.8.  Platelet count 99,000.  His ANC was 1.3.  He would had cultures taken.  Chest x-ray was negative.  He was started on Maxipime.  Will give him a dose of Neupogen.  I have to believe that the neutropenia still residual from his treatment with for the CLL.  His treatment was acalabrutinib with venetoclax.  He has been on treatment for a couple months.  He has not traveled.  He has been on no new medication.  He is on Coumadin.  There is been no rashes.  He has not noted any swollen lymph nodes.  Overall, I would say his performance status is probably ECOG 1.  Medications:  Current Facility-Administered Medications:    acetaminophen (TYLENOL) tablet 650 mg, 650 mg, Oral, Q6H PRN **OR** acetaminophen (TYLENOL) suppository 650 mg, 650 mg, Rectal, Q6H PRN, Jonelle Sidle, Mohammad L, MD   ceFEPIme (MAXIPIME) 2 g in sodium chloride 0.9 % 100 mL IVPB, 2 g, Intravenous, Q12H, Elwyn Reach, MD, Stopped at 08/20/21 0559   cholecalciferol (VITAMIN D) tablet 2,000 Units, 2,000 Units, Oral, q morning, Garba, Mohammad L, MD   docusate sodium (COLACE) capsule 100 mg, 100 mg, Oral, Daily PRN, Jonelle Sidle, Mohammad L, MD   filgrastim (NEUPOGEN) injection 480 mcg, 480 mcg, Subcutaneous, Daily, Kingsly Kloepfer, Rudell Cobb, MD   hydrochlorothiazide (HYDRODIURIL) tablet 12.5  mg, 12.5 mg, Oral, q morning, Garba, Mohammad L, MD   ipratropium (ATROVENT) 0.03 % nasal spray 2 spray, 2 spray, Each Nare, BID PRN, Elwyn Reach, MD   lactated ringers infusion, , Intravenous, Continuous, Elwyn Reach, MD, Last Rate: 75 mL/hr at 08/20/21 0336, New Bag at 08/20/21 0336   LORazepam (ATIVAN) tablet 0.5 mg, 0.5 mg, Oral, QHS PRN, Gala Romney L, MD, 0.5 mg at 08/20/21 0102   ondansetron (ZOFRAN) tablet 4 mg, 4 mg, Oral, Q6H PRN **OR** ondansetron (ZOFRAN) injection 4 mg, 4 mg, Intravenous, Q6H PRN, Jonelle Sidle, Mohammad L, MD   pantoprazole (PROTONIX) EC tablet 40 mg, 40 mg, Oral, Daily, Garba, Mohammad L, MD   rosuvastatin (CRESTOR) tablet 10 mg, 10 mg, Oral, QHS, Garba, Mohammad L, MD, 10 mg at 08/20/21 0102   sertraline (ZOLOFT) tablet 100 mg, 100 mg, Oral, q morning, Garba, Mohammad L, MD   sodium chloride (OCEAN) 0.65 % nasal spray 1 spray, 1 spray, Each Nare, PRN, Jonelle Sidle, Mohammad L, MD   warfarin (COUMADIN) tablet 7.5 mg, 7.5 mg, Oral, ONCE-1600, Angela Adam, [facility]   Warfarin - Pharmacist Dosing Inpatient, , Does not apply, q1600, Angela Adam, [facility]  Allergies:  Allergies  Allergen Reactions   Hydrocodone-Acetaminophen Shortness Of Breath   Oxycodone Hcl Shortness Of Breath   Telmisartan-Hctz Shortness Of Breath, Palpitations and Other (See Comments)    Dizziness also  Ramipril Other (See Comments)    Unknown reaction   Aspirin Rash and Other (See Comments)    Confusion, also- Cannot take high doses   Atorvastatin Other (See Comments)    Myalgia    Buspirone Hcl Other (See Comments)    Headaches    Codeine Hives, Nausea And Vomiting and Other (See Comments)    Confusion also   Morphine Sulfate Itching   Paroxetine Other (See Comments)    Paresthesias: R arm, R leg, r side of face   Rabeprazole Other (See Comments)    headache    Past Medical History, Surgical history, Social history, and Family History were reviewed and updated.  Review of  Systems: Review of Systems  Constitutional:  Positive for fever.  HENT:  Negative.    Eyes: Negative.   Respiratory: Negative.    Cardiovascular: Negative.  Negative for palpitations.  Gastrointestinal: Negative.   Endocrine: Negative.   Genitourinary: Negative.    Musculoskeletal: Negative.   Skin: Negative.   Neurological: Negative.   Hematological: Negative.   Psychiatric/Behavioral: Negative.     Physical Exam:  height is 5\' 10"  (1.778 m) and weight is 188 lb (85.3 kg). His oral temperature is 100.3 F (37.9 C). His blood pressure is 152/85 (abnormal) and his pulse is 87. His respiration is 19 and oxygen saturation is 98%.   Wt Readings from Last 3 Encounters:  08/20/21 188 lb (85.3 kg)  08/18/21 198 lb (89.8 kg)  07/26/21 194 lb 12.8 oz (88.4 kg)    Physical Exam Vitals reviewed.  HENT:     Head: Normocephalic and atraumatic.  Eyes:     Pupils: Pupils are equal, round, and reactive to light.  Cardiovascular:     Rate and Rhythm: Normal rate and regular rhythm.     Heart sounds: Normal heart sounds.  Pulmonary:     Effort: Pulmonary effort is normal.     Breath sounds: Normal breath sounds.  Abdominal:     General: Bowel sounds are normal.     Palpations: Abdomen is soft.  Musculoskeletal:        General: No tenderness or deformity. Normal range of motion.     Cervical back: Normal range of motion.  Lymphadenopathy:     Cervical: No cervical adenopathy.  Skin:    General: Skin is warm and dry.     Findings: No erythema or rash.  Neurological:     Mental Status: He is alert and oriented to person, place, and time.  Psychiatric:        Behavior: Behavior normal.        Thought Content: Thought content normal.        Judgment: Judgment normal.     Lab Results  Component Value Date   WBC 2.7 (L) 08/19/2021   HGB 11.8 (L) 08/19/2021   HCT 37.1 (L) 08/19/2021   MCV 96.6 08/19/2021   PLT 99 (L) 08/19/2021   @LASTCHEMISTRY @    Impression and  Plan: [name] is a very nice 75 year old African-American male.  He has a history of CLL.  He has been treated for this.  He has had neutropenia.  He was hospitalized I think about a month or so ago.  Had a complete work-up.  He had a TEE which was negative.  His white cell count is not as bad as I thought.  I do not have any lab work yet on him this morning.  There is cultures are negative, then we can probably  get him home in 1 day.  I will give a dose of Neupogen.  I do not think a bone marrow will really help [name] out right now.  I do believe that the staff on [address] will give him incredible care.  I do appreciate all of their efforts.  Lattie Haw, MD  Philippians 1:9   Volanda Napoleon, MD 10/10/20227:03 AM

## 2021-08-20 NOTE — Plan of Care (Signed)
Patient is alert and oriented x 4. Independent. Free of pain. Voids using the urinal. No issues overnight. Will continue to monitor for any change in condition. Bed locked in lower position.

## 2021-08-21 ENCOUNTER — Encounter (HOSPITAL_COMMUNITY): Payer: Self-pay | Admitting: Internal Medicine

## 2021-08-21 DIAGNOSIS — C9011 Plasma cell leukemia in remission: Secondary | ICD-10-CM | POA: Diagnosis not present

## 2021-08-21 DIAGNOSIS — D709 Neutropenia, unspecified: Secondary | ICD-10-CM | POA: Diagnosis not present

## 2021-08-21 DIAGNOSIS — R5081 Fever presenting with conditions classified elsewhere: Secondary | ICD-10-CM | POA: Diagnosis not present

## 2021-08-21 LAB — CBC WITH DIFFERENTIAL/PLATELET
Abs Immature Granulocytes: 0.04 10*3/uL (ref 0.00–0.07)
Basophils Absolute: 0 10*3/uL (ref 0.0–0.1)
Basophils Relative: 0 %
Eosinophils Absolute: 0 10*3/uL (ref 0.0–0.5)
Eosinophils Relative: 0 %
HCT: 31.5 % — ABNORMAL LOW (ref 39.0–52.0)
Hemoglobin: 10.5 g/dL — ABNORMAL LOW (ref 13.0–17.0)
Immature Granulocytes: 1 %
Lymphocytes Relative: 7 %
Lymphs Abs: 0.4 10*3/uL — ABNORMAL LOW (ref 0.7–4.0)
MCH: 30.9 pg (ref 26.0–34.0)
MCHC: 33.3 g/dL (ref 30.0–36.0)
MCV: 92.6 fL (ref 80.0–100.0)
Monocytes Absolute: 0.4 10*3/uL (ref 0.1–1.0)
Monocytes Relative: 6 %
Neutro Abs: 5.4 10*3/uL (ref 1.7–7.7)
Neutrophils Relative %: 86 %
Platelets: 85 10*3/uL — ABNORMAL LOW (ref 150–400)
RBC: 3.4 MIL/uL — ABNORMAL LOW (ref 4.22–5.81)
RDW: 13.6 % (ref 11.5–15.5)
WBC: 6.3 10*3/uL (ref 4.0–10.5)
nRBC: 0 % (ref 0.0–0.2)

## 2021-08-21 LAB — COMPREHENSIVE METABOLIC PANEL
ALT: 16 U/L (ref 0–44)
AST: 25 U/L (ref 15–41)
Albumin: 3.3 g/dL — ABNORMAL LOW (ref 3.5–5.0)
Alkaline Phosphatase: 54 U/L (ref 38–126)
Anion gap: 8 (ref 5–15)
BUN: 20 mg/dL (ref 8–23)
CO2: 26 mmol/L (ref 22–32)
Calcium: 8.5 mg/dL — ABNORMAL LOW (ref 8.9–10.3)
Chloride: 98 mmol/L (ref 98–111)
Creatinine, Ser: 1.28 mg/dL — ABNORMAL HIGH (ref 0.61–1.24)
GFR, Estimated: 58 mL/min — ABNORMAL LOW (ref >60)
Glucose, Bld: 92 mg/dL (ref 70–99)
Potassium: 3.4 mmol/L — ABNORMAL LOW (ref 3.5–5.1)
Sodium: 132 mmol/L — ABNORMAL LOW (ref 135–145)
Total Bilirubin: 0.9 mg/dL (ref 0.3–1.2)
Total Protein: 5.6 g/dL — ABNORMAL LOW (ref 6.5–8.1)

## 2021-08-21 LAB — PROTIME-INR
INR: 1.9 — ABNORMAL HIGH (ref 0.8–1.2)
Prothrombin Time: 22.1 seconds — ABNORMAL HIGH (ref 11.4–15.2)

## 2021-08-21 LAB — URINE CULTURE: Culture: NO GROWTH

## 2021-08-21 MED ORDER — INFLUENZA VAC A&B SA ADJ QUAD 0.5 ML IM PRSY: 0.5000 mL | PREFILLED_SYRINGE | INTRAMUSCULAR | Status: DC

## 2021-08-21 MED ORDER — POTASSIUM CHLORIDE CRYS ER 20 MEQ PO TBCR
40.0000 meq | EXTENDED_RELEASE_TABLET | Freq: Once | ORAL | Status: AC
  Administered 2021-08-21: 40 meq via ORAL
  Filled 2021-08-21: qty 2

## 2021-08-21 NOTE — Progress Notes (Signed)
[name] is doing okay.  All of his cultures are negative.  He got a dose of Neupogen.  His white cell count went up to 6.3.  He feels okay.  He has had no cough.  He has had no nausea or vomiting.  There is no diarrhea.  There is no rashes.  His electrolytes all look okay.  The BUN is 20 creatinine 1.28.  Calcium 8.5.  His albumin is 3.3.  Platelet count is down a little bit.  I just wonder if he has some kind of viral syndrome that could be doing this.  It is hard to prove.  I will stop his antibiotics.  I think he probably go home.  Nothing is happening in the hospital.  We can get him back to the office on Friday and see how his blood count looks.  I know that he has had fantastic care from all staff up on [address]  Lattie Haw, MD   Romans 12:10

## 2021-08-21 NOTE — Discharge Instructions (Signed)
Follow up with Oncologist on Friday for labs Come back to ER or call 911 if fevers return

## 2021-08-21 NOTE — Discharge Summary (Addendum)
Discharge Summary:   [name]   BJY:[id number] DOB: 1946/05/22 DOA: 08/19/2021  PCP: [name], MD  Admit date: 08/19/2021 Discharge date: 08/21/2021  Admitted From: Home Disposition: Home  Discharge Condition: Improved and stable CODE STATUS: Full code Diet recommendation: Heart healthy   Hospital Course:   75 year old male with a past medical history significant for CLL, on chronic anticoagulation for pulmonary embolism and A. fib with Coumadin, essential hypertension, history of pulmonary nodule, history of prostate cancer status postradiation therapy, dyslipidemia.  This patient presented to emergency department with generalized weakness and fevers at home.  Admitted for neutropenic fevers.  Started on broad-spectrum antibiotics with vancomycin and cefepime.  Oncology was consulted.  Infectious source work-up including chest x-ray, urine culture, urine urinalysis, blood cultures were negative.  Given a dose of Neupogen by oncology.  Felt to be secondary to her viral syndrome.  Furthermore he had no abdominal symptoms including abdominal pain, nausea, vomiting or diarrhea.  No rashes.  From oncology perspective cleared for discharge.  He will follow-up with them Friday for repeat labs.   Discharge Diagnoses:   Neutropenic fevers likely secondary to viral syndrome CLL Anxiety Atrial fibrillation on chronic anticoagulation with Coumadin Hypertension Dyslipidemia CKD 3   Discharge Instructions:    Discharge Instructions     Call MD for:  temperature >100.4   Complete by: As directed    Diet - low sodium heart healthy   Complete by: As directed    Increase activity slowly   Complete by: As directed       Allergies as of 08/21/2021       Reactions   Hydrocodone-acetaminophen Shortness Of Breath   Oxycodone Hcl Shortness Of Breath   Telmisartan-hctz Shortness Of Breath, Palpitations, Other (See Comments)   Dizziness also   Ramipril Other (See  Comments)   Unknown reaction   Aspirin Rash, Other (See Comments)   Confusion, also- Cannot take high doses   Atorvastatin Other (See Comments)   Myalgia   Buspirone Hcl Other (See Comments)   Headaches   Codeine Hives, Nausea And Vomiting, Other (See Comments)   Confusion also   Morphine Sulfate Itching   Paroxetine Other (See Comments)   Paresthesias: R arm, R leg, r side of face   Rabeprazole Other (See Comments)   headache        Medication List     STOP taking these medications    amoxicillin-clavulanate 875-125 MG tablet Commonly known as: AUGMENTIN       TAKE these medications    [employer] Motion Sickness Relief 25 MG Chew Generic drug: Meclizine HCl CHEW 1 TABLET EVERY DAY AS NEEDED FOR DIZZINESS What changed: See the new instructions.   docusate sodium 100 MG capsule Commonly known as: COLACE Take 100 mg by mouth daily as needed (constipation).   hydrochlorothiazide 12.5 MG tablet Commonly known as: HYDRODIURIL TAKE 1 TABLET BY MOUTH EVERY DAY What changed: when to take this   ipratropium 0.03 % nasal spray Commonly known as: ATROVENT Place 2 sprays into both nostrils every 12 (twelve) hours. What changed:  when to take this reasons to take this   LORazepam 0.5 MG tablet Commonly known as: ATIVAN Take 1 tablet (0.5 mg total) by mouth at bedtime as needed for anxiety (can take 1/4 or 1/2 tablet  if effective.). What changed:  how much to take reasons to take this   omeprazole 40 MG capsule Commonly known as: PRILOSEC Take 40 mg by mouth  every evening.   rosuvastatin 10 MG tablet Commonly known as: CRESTOR TAKE 1 TABLET BY MOUTH EVERY DAY What changed: when to take this   sertraline 100 MG tablet Commonly known as: ZOLOFT Take 100 mg by mouth every morning.   sodium chloride 0.65 % Soln nasal spray Commonly known as: OCEAN Place 1 spray into both nostrils as needed for congestion.   venetoclax 100 MG tablet Commonly known as:  VENCLEXTA Take 1 tablet (100 mg total) by mouth daily. Tablets should be swallowed whole with a meal and a full glass of water.   Vitamin D3 50 MCG (2000 UT) capsule Take 2,000 Units by mouth every morning.   warfarin 5 MG tablet Commonly known as: COUMADIN Take as directed. If you are unsure how to take this medication, talk to your nurse or doctor. Original instructions: TAKE AS DIRECTED BY COUMADIN CLINIC What changed: See the new instructions.        Allergies  Allergen Reactions   Hydrocodone-Acetaminophen Shortness Of Breath   Oxycodone Hcl Shortness Of Breath   Telmisartan-Hctz Shortness Of Breath, Palpitations and Other (See Comments)    Dizziness also   Ramipril Other (See Comments)    Unknown reaction   Aspirin Rash and Other (See Comments)    Confusion, also- Cannot take high doses   Atorvastatin Other (See Comments)    Myalgia    Buspirone Hcl Other (See Comments)    Headaches    Codeine Hives, Nausea And Vomiting and Other (See Comments)    Confusion also   Morphine Sulfate Itching   Paroxetine Other (See Comments)    Paresthesias: R arm, R leg, r side of face   Rabeprazole Other (See Comments)    headache      Consultations:   Oncology   Procedures/Studies:   DG Chest 2 View  Result Date: 08/18/2021 CLINICAL DATA:  Short of breath and fever, suspected sepsis EXAM: CHEST - 2 VIEW COMPARISON:  07/08/2021 FINDINGS: Frontal and lateral views of the chest demonstrates stable postsurgical changes from aortic valve replacement. Cardiac silhouette is unremarkable. No airspace disease, effusion, or pneumothorax. No acute bony abnormalities. IMPRESSION: 1. No acute intrathoracic process. Electronically Signed   By: Randa Ngo M.D.   On: 08/18/2021 18:42   DG Chest Portable 1 View  Result Date: 08/19/2021 CLINICAL DATA:  Fever. EXAM: PORTABLE CHEST 1 VIEW COMPARISON:  August 18, 2021 FINDINGS: The heart size and mediastinal contours are stable. Both  lungs are clear. The visualized skeletal structures are unremarkable. IMPRESSION: No active disease. Electronically Signed   By: Abelardo Diesel M.D.   On: 08/19/2021 16:29       Discharge Exam:   Vitals:   08/21/21 0217 08/21/21 0613  BP: 115/69 111/71  Pulse: 90 83  Resp: 18 18  Temp: 99.8 F (37.7 C) 100 F (37.8 C)  SpO2: 91% 95%   Vitals:   08/20/21 1838 08/20/21 2211 08/21/21 0217 08/21/21 0613  BP: 115/66 (!) 115/59 115/69 111/71  Pulse: 68 73 90 83  Resp: 18 16 18 18   Temp: 98.5 F (36.9 C) 98.4 F (36.9 C) 99.8 F (37.7 C) 100 F (37.8 C)  TempSrc: Oral Oral Oral   SpO2: 96% 94% 91% 95%  Weight:      Height:        General: Pt is alert, awake, not in acute distress Cardiovascular: RRR, S1/S2 +, no rubs, no gallops Respiratory: CTA bilaterally, no wheezing, no rhonchi Abdominal: Soft, NT, ND, bowel sounds +  Extremities: no edema, no cyanosis    The results of significant diagnostics from this hospitalization (including imaging, microbiology, ancillary and laboratory) are listed below for reference.     Microbiology:   Recent Results (from the past 240 hour(s))  Culture, blood (Routine x 2)     Status: None (Preliminary result)   Collection Time: 08/18/21  6:03 PM   Specimen: BLOOD  Result Value Ref Range Status   Specimen Description   Final    BLOOD RIGHT ANTECUBITAL Performed at [facility] [address], [place], [place] [address]    Special Requests   Final    BOTTLES DRAWN AEROBIC AND ANAEROBIC Blood Culture adequate volume Performed at [facility] [address] [place], [place] [address]    Culture   Final    NO GROWTH 3 DAYS Performed at [facility], [place] [address], [place], [place] [address]    Report Status PENDING  Incomplete  Resp Panel by RT-PCR (Flu A&B, Covid) Nasopharyngeal Swab     Status: None   Collection Time: 08/18/21  6:04 PM   Specimen: Nasopharyngeal Swab;  Nasopharyngeal(NP) swabs in vial transport medium  Result Value Ref Range Status   SARS Coronavirus 2 by RT PCR NEGATIVE NEGATIVE Final    Comment: (NOTE) SARS-CoV-2 target nucleic acids are NOT DETECTED.  The SARS-CoV-2 RNA is generally detectable in upper respiratory specimens during the acute phase of infection. The lowest concentration of SARS-CoV-2 viral copies this assay can detect is 138 copies/mL. A negative result does not preclude SARS-Cov-2 infection and should not be used as the sole basis for treatment or other patient management decisions. A negative result may occur with  improper specimen collection/handling, submission of specimen other than nasopharyngeal swab, presence of viral mutation(s) within the areas targeted by this assay, and inadequate number of viral copies(<138 copies/mL). A negative result must be combined with clinical observations, patient history, and epidemiological information. The expected result is Negative.  Fact Sheet for Patients:  EntrepreneurPulse.com.au  Fact Sheet for Healthcare Providers:  IncredibleEmployment.be  This test is no t yet approved or cleared by the [place] FDA and  has been authorized for detection and/or diagnosis of SARS-CoV-2 by FDA under an Emergency Use Authorization (EUA). This EUA will remain  in effect (meaning this test can be used) for the duration of the COVID-19 declaration under Section 564(b)(1) of the Act, 21 U.S.C.section 360bbb-3(b)(1), unless the authorization is terminated  or revoked sooner.       Influenza A by PCR NEGATIVE NEGATIVE Final   Influenza B by PCR NEGATIVE NEGATIVE Final    Comment: (NOTE) The Xpert Xpress SARS-CoV-2/FLU/RSV plus assay is intended as an aid in the diagnosis of influenza from Nasopharyngeal swab specimens and should not be used as a sole basis for treatment. Nasal washings and aspirates are unacceptable for Xpert Xpress  SARS-CoV-2/FLU/RSV testing.  Fact Sheet for Patients: EntrepreneurPulse.com.au  Fact Sheet for Healthcare Providers: IncredibleEmployment.be  This test is not yet approved or cleared by the [place] FDA and has been authorized for detection and/or diagnosis of SARS-CoV-2 by FDA under an Emergency Use Authorization (EUA). This EUA will remain in effect (meaning this test can be used) for the duration of the COVID-19 declaration under Section 564(b)(1) of the Act, 21 U.S.C. section 360bbb-3(b)(1), unless the authorization is terminated or revoked.  Performed at [facility], [place] [address] [place], [place] [address]   Culture, blood (Routine x 2)  Status: None (Preliminary result)   Collection Time: 08/18/21  6:08 PM   Specimen: BLOOD  Result Value Ref Range Status   Specimen Description   Final    BLOOD BLOOD LEFT FOREARM Performed at [facility] [address], [place], [place] [address]    Special Requests   Final    BOTTLES DRAWN AEROBIC AND ANAEROBIC Blood Culture adequate volume Performed at [facility] [address], [place], [place] [address]    Culture   Final    NO GROWTH 3 DAYS Performed at [facility], [place] [address] [place], [place] [address]    Report Status PENDING  Incomplete  Culture, blood (routine x 2)     Status: None (Preliminary result)   Collection Time: 08/19/21  4:05 PM   Specimen: BLOOD  Result Value Ref Range Status   Specimen Description   Final    BLOOD BLOOD RIGHT FOREARM Performed at [facility] [address], [place], [place] [address]    Special Requests   Final    BOTTLES DRAWN AEROBIC AND ANAEROBIC Blood Culture results may not be optimal due to an inadequate volume of blood received in culture bottles Performed at [facility] [address] [place], [place] [address]    Culture    Final    NO GROWTH 2 DAYS Performed at [facility] [address], [place], [place] [address]    Report Status PENDING  Incomplete  Culture, blood (routine x 2)     Status: None (Preliminary result)   Collection Time: 08/19/21  4:52 PM   Specimen: BLOOD  Result Value Ref Range Status   Specimen Description   Final    BLOOD RIGHT ANTECUBITAL Performed at [facility] [address], [place], [place] [address]    Special Requests   Final    BOTTLES DRAWN AEROBIC AND ANAEROBIC Blood Culture results may not be optimal due to an inadequate volume of blood received in culture bottles Performed at [facility] [address] [place], [place] [address]    Culture   Final    NO GROWTH 2 DAYS Performed at [facility] [address] [place], [place] [address]    Report Status PENDING  Incomplete  Urine Culture     Status: None   Collection Time: 08/19/21  5:54 PM   Specimen: BLOOD; Urine  Result Value Ref Range Status   Specimen Description   Final    BLOOD RIGHT ANTECUBITAL Performed at [facility] [address], [place], [place] [address]    Special Requests   Final    BOTTLES DRAWN AEROBIC AND ANAEROBIC Blood Culture results may not be optimal due to an inadequate volume of blood received in culture bottles Performed at [facility] [address] [place] [address], [place], [place] [address]    Culture   Final    NO GROWTH Performed at [facility], [place] [address] [place], [place] [address]    Report Status 08/21/2021 FINAL  Final  Culture, blood (x 2)     Status: None (Preliminary result)   Collection Time: 08/20/21  3:40 AM   Specimen: BLOOD  Result Value Ref Range Status   Specimen Description   Final    BLOOD BLOOD LEFT HAND Performed at [facility] [address] [place], [place] [address]    Special Requests   Final  BOTTLES DRAWN AEROBIC ONLY Blood Culture  adequate volume Performed at [facility] [address], [place], [place] [address]    Culture   Final    NO GROWTH 1 DAY Performed at [facility], [place] [address] [place], [place] [address]    Report Status PENDING  Incomplete  Culture, blood (x 2)     Status: None (Preliminary result)   Collection Time: 08/20/21  3:42 AM   Specimen: BLOOD  Result Value Ref Range Status   Specimen Description   Final    BLOOD RIGHT ANTECUBITAL Performed at [facility] [address], [place], [place] [address]    Special Requests   Final    BOTTLES DRAWN AEROBIC ONLY Blood Culture adequate volume Performed at [facility] [address] [place], [place] [address]    Culture   Final    NO GROWTH 1 DAY Performed at [facility], [place] [address] [place], [place] [address]    Report Status PENDING  Incomplete       Labs:   BNP (last 3 results) No results for input(s): BNP in the last 8760 hours. Basic Metabolic Panel: Recent Labs  Lab 08/18/21 1900 08/20/21 0340 08/21/21 0316  NA 139 135 132*  K 3.9 3.7 3.4*  CL 106 103 98  CO2 24 25 26   GLUCOSE 127* 94 92  BUN 35* 20 20  CREATININE 2.06* 1.42* 1.28*  CALCIUM 9.5 8.5* 8.5*   Liver Function Tests: Recent Labs  Lab 08/18/21 1900 08/20/21 0340 08/21/21 0316  AST 24 25 25   ALT 19 17 16   ALKPHOS 76 58 54  BILITOT 0.7 1.0 0.9  PROT 6.9 6.0* 5.6*  ALBUMIN 4.0 3.5 3.3*   Recent Labs  Lab 08/20/21 0340  LIPASE 44   No results for input(s): AMMONIA in the last 168 hours. CBC: Recent Labs  Lab 08/18/21 1900 08/19/21 1652 08/21/21 0316  WBC 2.6* 2.7* 6.3  NEUTROABS 1.7 1.3* 5.4  HGB 12.0* 11.8* 10.5*  HCT 36.7* 37.1* 31.5*  MCV 94.8 96.6 92.6  PLT 110* 99* 85*   Cardiac Enzymes: No results for input(s): CKTOTAL, CKMB, CKMBINDEX, TROPONINI in the last 168 hours. BNP: Invalid input(s): POCBNP CBG: No results for input(s): GLUCAP in the last  168 hours. D-Dimer No results for input(s): DDIMER in the last 72 hours. Hgb A1c No results for input(s): HGBA1C in the last 72 hours. Lipid Profile No results for input(s): CHOL, HDL, LDLCALC, TRIG, CHOLHDL, LDLDIRECT in the last 72 hours. Thyroid function studies No results for input(s): TSH, T4TOTAL, T3FREE, THYROIDAB in the last 72 hours.  Invalid input(s): FREET3 Anemia work up No results for input(s): VITAMINB12, FOLATE, FERRITIN, TIBC, IRON, RETICCTPCT in the last 72 hours. Urinalysis    Component Value Date/Time   COLORURINE YELLOW 08/19/2021 1754   APPEARANCEUR CLEAR 08/19/2021 1754   LABSPEC 1.021 08/19/2021 1754   PHURINE 6.0 08/19/2021 1754   GLUCOSEU NEGATIVE 08/19/2021 1754   GLUCOSEU NEGATIVE 08/11/2017 0843   HGBUR MODERATE (A) 08/19/2021 1754   HGBUR negative 03/20/2010 0843   BILIRUBINUR NEGATIVE 08/19/2021 1754   BILIRUBINUR n 03/22/2011 0000   KETONESUR NEGATIVE 08/19/2021 1754   PROTEINUR 30 (A) 08/19/2021 1754   UROBILINOGEN 0.2 08/11/2017 0843   NITRITE NEGATIVE 08/19/2021 1754   LEUKOCYTESUR NEGATIVE 08/19/2021 1754   Sepsis Labs Invalid input(s): PROCALCITONIN,  WBC,  LACTICIDVEN Microbiology Recent Results (from the past 240 hour(s))  Culture, blood (Routine x  2)     Status: None (Preliminary result)   Collection Time: 08/18/21  6:03 PM   Specimen: BLOOD  Result Value Ref Range Status   Specimen Description   Final    BLOOD RIGHT ANTECUBITAL Performed at [facility] [address] [place], [place] [address]    Special Requests   Final    BOTTLES DRAWN AEROBIC AND ANAEROBIC Blood Culture adequate volume Performed at [facility] [address], [place], [place] [address]    Culture   Final    NO GROWTH 3 DAYS Performed at [facility], [place] [address], [place], [place] [address]    Report Status PENDING  Incomplete  Resp Panel by RT-PCR (Flu A&B, Covid) Nasopharyngeal Swab     Status: None    Collection Time: 08/18/21  6:04 PM   Specimen: Nasopharyngeal Swab; Nasopharyngeal(NP) swabs in vial transport medium  Result Value Ref Range Status   SARS Coronavirus 2 by RT PCR NEGATIVE NEGATIVE Final    Comment: (NOTE) SARS-CoV-2 target nucleic acids are NOT DETECTED.  The SARS-CoV-2 RNA is generally detectable in upper respiratory specimens during the acute phase of infection. The lowest concentration of SARS-CoV-2 viral copies this assay can detect is 138 copies/mL. A negative result does not preclude SARS-Cov-2 infection and should not be used as the sole basis for treatment or other patient management decisions. A negative result may occur with  improper specimen collection/handling, submission of specimen other than nasopharyngeal swab, presence of viral mutation(s) within the areas targeted by this assay, and inadequate number of viral copies(<138 copies/mL). A negative result must be combined with clinical observations, patient history, and epidemiological information. The expected result is Negative.  Fact Sheet for Patients:  EntrepreneurPulse.com.au  Fact Sheet for Healthcare Providers:  IncredibleEmployment.be  This test is no t yet approved or cleared by the [place] FDA and  has been authorized for detection and/or diagnosis of SARS-CoV-2 by FDA under an Emergency Use Authorization (EUA). This EUA will remain  in effect (meaning this test can be used) for the duration of the COVID-19 declaration under Section 564(b)(1) of the Act, 21 U.S.C.section 360bbb-3(b)(1), unless the authorization is terminated  or revoked sooner.       Influenza A by PCR NEGATIVE NEGATIVE Final   Influenza B by PCR NEGATIVE NEGATIVE Final    Comment: (NOTE) The Xpert Xpress SARS-CoV-2/FLU/RSV plus assay is intended as an aid in the diagnosis of influenza from Nasopharyngeal swab specimens and should not be used as a sole basis for treatment.  Nasal washings and aspirates are unacceptable for Xpert Xpress SARS-CoV-2/FLU/RSV testing.  Fact Sheet for Patients: EntrepreneurPulse.com.au  Fact Sheet for Healthcare Providers: IncredibleEmployment.be  This test is not yet approved or cleared by the [place] FDA and has been authorized for detection and/or diagnosis of SARS-CoV-2 by FDA under an Emergency Use Authorization (EUA). This EUA will remain in effect (meaning this test can be used) for the duration of the COVID-19 declaration under Section 564(b)(1) of the Act, 21 U.S.C. section 360bbb-3(b)(1), unless the authorization is terminated or revoked.  Performed at [facility], [place] [address], [place], [place] [address]   Culture, blood (Routine x 2)     Status: None (Preliminary result)   Collection Time: 08/18/21  6:08 PM   Specimen: BLOOD  Result Value Ref Range Status   Specimen Description   Final    BLOOD BLOOD LEFT FOREARM Performed at [facility],  [place] [address], [place], [place] [address]    Special Requests   Final    BOTTLES DRAWN AEROBIC AND ANAEROBIC Blood Culture adequate volume Performed at [facility] [address], [place], [place] [address]    Culture   Final    NO GROWTH 3 DAYS Performed at [facility], [place] [address], [place], [place] [address]    Report Status PENDING  Incomplete  Culture, blood (routine x 2)     Status: None (Preliminary result)   Collection Time: 08/19/21  4:05 PM   Specimen: BLOOD  Result Value Ref Range Status   Specimen Description   Final    BLOOD BLOOD RIGHT FOREARM Performed at [facility] [address], [place], [place] [address]    Special Requests   Final    BOTTLES DRAWN AEROBIC AND ANAEROBIC Blood Culture results may not be optimal due to an inadequate volume of blood received in culture bottles Performed at [facility] [address] [place], [place] [address]    Culture   Final    NO GROWTH 2 DAYS Performed at [facility] [address] [place], [place] [address]    Report Status PENDING  Incomplete  Culture, blood (routine x 2)     Status: None (Preliminary result)   Collection Time: 08/19/21  4:52 PM   Specimen: BLOOD  Result Value Ref Range Status   Specimen Description   Final    BLOOD RIGHT ANTECUBITAL Performed at [facility] [address], [place], [place] [address]    Special Requests   Final    BOTTLES DRAWN AEROBIC AND ANAEROBIC Blood Culture results may not be optimal due to an inadequate volume of blood received in culture bottles Performed at [facility] [address] [place], [place] [address]    Culture   Final    NO GROWTH 2 DAYS Performed at [facility] [address], [place], [place] [address]    Report Status PENDING  Incomplete  Urine Culture     Status: None   Collection Time: 08/19/21  5:54 PM   Specimen: BLOOD; Urine  Result Value Ref Range Status   Specimen Description   Final    BLOOD RIGHT ANTECUBITAL Performed at [facility] [address] [place], [place] [address]    Special Requests   Final    BOTTLES DRAWN AEROBIC AND ANAEROBIC Blood Culture results may not be optimal due to an inadequate volume of blood received in culture bottles Performed at [facility] [address] [place], [place] [address]    Culture   Final    NO GROWTH Performed at [facility], [place] [address] [place], [place] [address]    Report Status 08/21/2021 FINAL  Final  Culture, blood (x 2)     Status: None (Preliminary result)   Collection Time: 08/20/21  3:40 AM   Specimen: BLOOD  Result Value Ref Range Status   Specimen Description   Final    BLOOD BLOOD LEFT HAND Performed at [facility] [address] [place], [place] [address]     Special Requests   Final    BOTTLES DRAWN AEROBIC ONLY Blood Culture adequate volume Performed at [facility] [address] [place],  [address]    Culture   Final    NO GROWTH 1 DAY Performed at [facility], [place] [address], [place], [place] [address]    Report Status PENDING  Incomplete  Culture, blood (x 2)     Status: None (Preliminary result)   Collection Time: 08/20/21  3:42 AM   Specimen: BLOOD  Result Value Ref Range Status   Specimen Description   Final    BLOOD RIGHT ANTECUBITAL Performed at [facility] [address] [place], [place] [address]    Special Requests   Final    BOTTLES DRAWN AEROBIC ONLY Blood Culture adequate volume Performed at [facility] [address], [place], [place] [address]    Culture   Final    NO GROWTH 1 DAY Performed at [facility], [place] [address] [place], [place] [address]    Report Status PENDING  Incomplete     Time coordinating discharge: 45 minutes  SIGNED:   Leslee Home, MD  [facility] 08/21/2021, 12:23 PM Pager   If 7PM-7AM, please contact night-coverage [email or web address] Password [email or web address]

## 2021-08-22 ENCOUNTER — Telehealth: Payer: Self-pay | Admitting: *Deleted

## 2021-08-22 ENCOUNTER — Inpatient Hospital Stay: Payer: Medicare Other

## 2021-08-22 ENCOUNTER — Telehealth: Payer: Self-pay

## 2021-08-22 NOTE — Telephone Encounter (Signed)
Per staff message Tiffany - called and gave upcoming appointments - confirmed

## 2021-08-22 NOTE — Telephone Encounter (Signed)
Transition Care Management Follow-up Telephone Call Date of discharge and from where: 08/21/21 from [facility] How have you been since you were released from the hospital? Reports feeling drained, sleeping a lot, weak, experienced a little dizziness that passed. Denies being dizzy now. Reinforced discharge instructions-reasons to call 911 or get help right away. Any questions or concerns? No  Items Reviewed: Did the pt receive and understand the discharge instructions provided? Yes  Medications obtained and verified? Yes  Other? No  Any new allergies since your discharge? No  Dietary orders reviewed? Yes Do you have support at home?  Lives by self, but has support  Home Care and Equipment/Supplies: Were home health services ordered? no If so, what is the name of the agency? Not applicable  Has the agency set up a time to come to the patient's home? not applicable Were any new equipment or medical supplies ordered?  No What is the name of the medical supply agency? Not applicable Were you able to get the supplies/equipment? not applicable Do you have any questions related to the use of the equipment or supplies? No  Functional Questionnaire: (I = Independent and D = Dependent) ADLs: I  Bathing/Dressing- I  Meal Prep- I  Eating- I  Maintaining continence- I  Transferring/Ambulation- I  Managing Meds- I  Follow up appointments reviewed:  PCP Hospital f/u appt confirmed? No  request for scheduler to call and schedule placed. [facility] f/u appt confirmed? Yes  Scheduled to see oncologist Dr. Martha Clan on 08/24/21 @ 1:00. Are transportation arrangements needed? No  If their condition worsens, is the pt aware to call PCP or go to the Emergency Dept.? Yes Was the patient provided with contact information for the PCP's office or ED? Yes Was to pt encouraged to call back with questions or concerns? Yes    Thea Silversmith, RN, MSN, BSN, [place] [facility]  Coordinator [phone]

## 2021-08-23 ENCOUNTER — Other Ambulatory Visit: Payer: Self-pay | Admitting: *Deleted

## 2021-08-23 DIAGNOSIS — D709 Neutropenia, unspecified: Secondary | ICD-10-CM

## 2021-08-23 DIAGNOSIS — C9110 Chronic lymphocytic leukemia of B-cell type not having achieved remission: Secondary | ICD-10-CM

## 2021-08-23 DIAGNOSIS — R5081 Fever presenting with conditions classified elsewhere: Secondary | ICD-10-CM

## 2021-08-23 LAB — CULTURE, BLOOD (ROUTINE X 2)
Culture: NO GROWTH
Culture: NO GROWTH
Special Requests: ADEQUATE
Special Requests: ADEQUATE

## 2021-08-24 ENCOUNTER — Encounter: Payer: Self-pay | Admitting: Hematology & Oncology

## 2021-08-24 ENCOUNTER — Inpatient Hospital Stay: Payer: Medicare Other | Attending: Hematology & Oncology

## 2021-08-24 ENCOUNTER — Other Ambulatory Visit: Payer: Self-pay

## 2021-08-24 ENCOUNTER — Inpatient Hospital Stay: Payer: Medicare Other

## 2021-08-24 ENCOUNTER — Inpatient Hospital Stay (HOSPITAL_BASED_OUTPATIENT_CLINIC_OR_DEPARTMENT_OTHER): Payer: Medicare Other | Admitting: Hematology & Oncology

## 2021-08-24 VITALS — BP 134/80 | HR 63 | Temp 97.8°F | Resp 18

## 2021-08-24 VITALS — Wt 188.0 lb

## 2021-08-24 DIAGNOSIS — R5081 Fever presenting with conditions classified elsewhere: Secondary | ICD-10-CM

## 2021-08-24 DIAGNOSIS — C9110 Chronic lymphocytic leukemia of B-cell type not having achieved remission: Secondary | ICD-10-CM

## 2021-08-24 DIAGNOSIS — D709 Neutropenia, unspecified: Secondary | ICD-10-CM

## 2021-08-24 DIAGNOSIS — D701 Agranulocytosis secondary to cancer chemotherapy: Secondary | ICD-10-CM | POA: Insufficient documentation

## 2021-08-24 LAB — CMP (CANCER CENTER ONLY)
ALT: 16 U/L (ref 0–44)
AST: 23 U/L (ref 15–41)
Albumin: 3.9 g/dL (ref 3.5–5.0)
Alkaline Phosphatase: 86 U/L (ref 38–126)
Anion gap: 8 (ref 5–15)
BUN: 20 mg/dL (ref 8–23)
CO2: 31 mmol/L (ref 22–32)
Calcium: 10 mg/dL (ref 8.9–10.3)
Chloride: 102 mmol/L (ref 98–111)
Creatinine: 1.38 mg/dL — ABNORMAL HIGH (ref 0.61–1.24)
GFR, Estimated: 53 mL/min — ABNORMAL LOW (ref >60)
Glucose, Bld: 98 mg/dL (ref 70–99)
Potassium: 4 mmol/L (ref 3.5–5.1)
Sodium: 141 mmol/L (ref 135–145)
Total Bilirubin: 0.4 mg/dL (ref 0.3–1.2)
Total Protein: 5.9 g/dL — ABNORMAL LOW (ref 6.5–8.1)

## 2021-08-24 LAB — CBC WITH DIFFERENTIAL (CANCER CENTER ONLY)
Abs Immature Granulocytes: 0.05 10*3/uL (ref 0.00–0.07)
Basophils Absolute: 0 10*3/uL (ref 0.0–0.1)
Basophils Relative: 0 %
Eosinophils Absolute: 0.1 10*3/uL (ref 0.0–0.5)
Eosinophils Relative: 2 %
HCT: 31.4 % — ABNORMAL LOW (ref 39.0–52.0)
Hemoglobin: 10.3 g/dL — ABNORMAL LOW (ref 13.0–17.0)
Immature Granulocytes: 1 %
Lymphocytes Relative: 19 %
Lymphs Abs: 0.7 10*3/uL (ref 0.7–4.0)
MCH: 31 pg (ref 26.0–34.0)
MCHC: 32.8 g/dL (ref 30.0–36.0)
MCV: 94.6 fL (ref 80.0–100.0)
Monocytes Absolute: 0.4 10*3/uL (ref 0.1–1.0)
Monocytes Relative: 11 %
Neutro Abs: 2.5 10*3/uL (ref 1.7–7.7)
Neutrophils Relative %: 67 %
Platelet Count: 127 10*3/uL — ABNORMAL LOW (ref 150–400)
RBC: 3.32 MIL/uL — ABNORMAL LOW (ref 4.22–5.81)
RDW: 13.8 % (ref 11.5–15.5)
WBC Count: 3.8 10*3/uL — ABNORMAL LOW (ref 4.0–10.5)
nRBC: 0 % (ref 0.0–0.2)

## 2021-08-24 LAB — CULTURE, BLOOD (ROUTINE X 2)
Culture: NO GROWTH
Culture: NO GROWTH

## 2021-08-24 MED ORDER — PEGFILGRASTIM-BMEZ 6 MG/0.6ML ~~LOC~~ SOSY
6.0000 mg | PREFILLED_SYRINGE | Freq: Once | SUBCUTANEOUS | Status: AC
  Administered 2021-08-24: 6 mg via SUBCUTANEOUS
  Filled 2021-08-24: qty 0.6

## 2021-08-24 NOTE — Progress Notes (Signed)
Hematology and Oncology Follow Up Visit  [name] [id number] 02/25/1946 75 y.o. 08/24/2021   Principle Diagnosis:  Right lower lobe pulmonary nodule- non-malignant Recurrent pulmonary embolism CLL -- progressive -- 11q-/13q- Chronic neutropenia secondary to chemotherapy  Current Therapy:   Coumadin 5 mg po q day Calquence 100 mg po BID -- start on 03/13/2020 -- d/c on 07/05/2021 Venetoclax 100 mg po q day -- change 07/05/2021 -- d/c on 07/05/2021 Neulasta 6 mg subcu as needed     Interim History:  Mr.  Kauth is back for after being in the hospital.  He was admitted to [facility] last Sunday.  He again had neutropenia.  He has is from his chemotherapy.  He has been off venetoclax and acalabrutinib for almost 2 months.  His cultures were all negative.  X-rays were all negative.  We did give him a dose of Neupogen in the hospital.  He responded very well to this.  As such, we were able to get him out of the hospital.  He comes back in.  He feels well.  He has had no fever.  He has had no shakes.    There has been no change in bowel or bladder habits.  He has cancel his trip over to [place].  I think this is probably a wise move given the fact that he has had issues with his immune system.  Currently, his performance status is ECOG 1.     Medications:  Current Outpatient Medications:    Cholecalciferol (VITAMIN D3) 50 MCG (2000 UT) capsule, Take 2,000 Units by mouth every morning., Disp: , Rfl:    [employer] MOTION SICKNESS RELIEF 25 MG CHEW, CHEW 1 TABLET EVERY DAY AS NEEDED FOR DIZZINESS (Patient taking differently: Chew 25 mg by mouth every morning.), Disp: 32 tablet, Rfl: 2   docusate sodium (COLACE) 100 MG capsule, Take 100 mg by mouth daily as needed (constipation)., Disp: , Rfl:    hydrochlorothiazide (HYDRODIURIL) 12.5 MG tablet, TAKE 1 TABLET BY MOUTH EVERY DAY (Patient taking differently: Take 12.5 mg by mouth every morning.), Disp: 90 tablet, Rfl: 1    ipratropium (ATROVENT) 0.03 % nasal spray, Place 2 sprays into both nostrils every 12 (twelve) hours. (Patient taking differently: Place 2 sprays into both nostrils 2 (two) times daily as needed for rhinitis (congestion).), Disp: 30 mL, Rfl: 12   LORazepam (ATIVAN) 0.5 MG tablet, Take 1 tablet (0.5 mg total) by mouth at bedtime as needed for anxiety (can take 1/4 or 1/2 tablet  if effective.). (Patient taking differently: Take 0.125-0.5 mg by mouth at bedtime as needed for anxiety.), Disp: 30 tablet, Rfl: 2   omeprazole (PRILOSEC) 40 MG capsule, Take 40 mg by mouth every evening., Disp: , Rfl:    rosuvastatin (CRESTOR) 10 MG tablet, TAKE 1 TABLET BY MOUTH EVERY DAY (Patient taking differently: Take 10 mg by mouth at bedtime.), Disp: 90 tablet, Rfl: 3   sertraline (ZOLOFT) 100 MG tablet, Take 100 mg by mouth every morning., Disp: , Rfl:    sodium chloride (OCEAN) 0.65 % SOLN nasal spray, Place 1 spray into both nostrils as needed for congestion., Disp: , Rfl:    venetoclax (VENCLEXTA) 100 MG tablet, Take 1 tablet (100 mg total) by mouth daily. Tablets should be swallowed whole with a meal and a full glass of water. (Patient not taking: No sig reported), Disp: 30 tablet, Rfl: 4   warfarin (COUMADIN) 5 MG tablet, TAKE AS DIRECTED BY COUMADIN CLINIC (Patient taking differently:  Take 5-7.5 mg by mouth See admin instructions. Take 7.5 mg by mouth in the evening on Sun/Tues/Wed/Thurs/Fri/Sat and 5 mg on Mondays), Disp: 160 tablet, Rfl: 1  Allergies:  Allergies  Allergen Reactions   Hydrocodone-Acetaminophen Shortness Of Breath   Oxycodone Hcl Shortness Of Breath   Telmisartan-Hctz Shortness Of Breath, Palpitations and Other (See Comments)    Dizziness also   Ramipril Other (See Comments)    Unknown reaction   Aspirin Rash and Other (See Comments)    Confusion, also- Cannot take high doses   Atorvastatin Other (See Comments)    Myalgia    Buspirone Hcl Other (See Comments)    Headaches    Codeine  Hives, Nausea And Vomiting and Other (See Comments)    Confusion also   Morphine Sulfate Itching   Paroxetine Other (See Comments)    Paresthesias: R arm, R leg, r side of face   Rabeprazole Other (See Comments)    headache    Past Medical History, Surgical history, Social history, and Family History were reviewed and updated.  Review of Systems: Review of Systems  Constitutional: Negative.   HENT: Negative.    Eyes: Negative.   Respiratory: Negative.    Cardiovascular: Negative.   Gastrointestinal: Negative.   Genitourinary: Negative.   Musculoskeletal: Negative.   Skin: Negative.   Neurological: Negative.   Endo/Heme/Allergies: Negative.   Psychiatric/Behavioral: Negative.     Physical Exam:  weight is 188 lb (85.3 kg).   Physical Exam Vitals reviewed.  Constitutional:      Comments: Axillary exam does not show any palpable adenopathy in either axilla. Marland Kitchen    HENT:     Head: Normocephalic and atraumatic.  Eyes:     Pupils: Pupils are equal, round, and reactive to light.  Neck:     Comments: On his neck exam, he has had nice regression of his lymphadenopathy.  I cannot palpate any adenopathy in the cervical or supraclavicular chains. Cardiovascular:     Rate and Rhythm: Normal rate and regular rhythm.     Heart sounds: Normal heart sounds.  Pulmonary:     Effort: Pulmonary effort is normal.     Breath sounds: Normal breath sounds.  Abdominal:     General: Bowel sounds are normal.     Palpations: Abdomen is soft.  Musculoskeletal:        General: No tenderness or deformity. Normal range of motion.     Cervical back: Normal range of motion.  Lymphadenopathy:     Cervical: No cervical adenopathy.  Skin:    General: Skin is warm and dry.     Findings: No erythema or rash.  Neurological:     Mental Status: He is alert and oriented to person, place, and time.  Psychiatric:        Behavior: Behavior normal.        Thought Content: Thought content normal.         Judgment: Judgment normal.    Lab Results  Component Value Date   WBC 3.8 (L) 08/24/2021   HGB 10.3 (L) 08/24/2021   HCT 31.4 (L) 08/24/2021   MCV 94.6 08/24/2021   PLT 127 (L) 08/24/2021     Chemistry      Component Value Date/Time   NA 141 08/24/2021 1310   NA 145 07/22/2017 0756   NA 139 06/27/2016 1410   K 4.0 08/24/2021 1310   K 4.3 07/22/2017 0756   K 5.1 06/27/2016 1410   CL 102 08/24/2021 1310  CL 105 07/22/2017 0756   CO2 31 08/24/2021 1310   CO2 30 07/22/2017 0756   CO2 28 06/27/2016 1410   BUN 20 08/24/2021 1310   BUN 17 07/22/2017 0756   BUN 20.9 06/27/2016 1410   CREATININE 1.38 (H) 08/24/2021 1310   CREATININE 1.6 (H) 07/22/2017 0756   CREATININE 1.6 (H) 06/27/2016 1410      Component Value Date/Time   CALCIUM 10.0 08/24/2021 1310   CALCIUM 9.6 07/22/2017 0756   CALCIUM 9.6 06/27/2016 1410   ALKPHOS 86 08/24/2021 1310   ALKPHOS 59 07/22/2017 0756   ALKPHOS 62 06/27/2016 1410   AST 23 08/24/2021 1310   AST 18 06/27/2016 1410   ALT 16 08/24/2021 1310   ALT 24 07/22/2017 0756   ALT 24 06/27/2016 1410   BILITOT 0.4 08/24/2021 1310   BILITOT 0.46 06/27/2016 1410      Impression and Plan: Mr. Higinbotham is 75 year-old African-American gentleman.  He has recurrent CLL.    He got his Neulasta today.  We will see how long this lasts.  I would like to have him come back on October 31 and we will see how his CBC is.  I would not make an appointment for him as of yet.  I would like to see how his blood count is with the Neulasta.  I am sure I will probably see him back sometime around Thanksgiving.  Marland KitchenVolanda Napoleon, MD 10/14/20222:19 PM

## 2021-08-25 LAB — CULTURE, BLOOD (ROUTINE X 2)
Culture: NO GROWTH
Culture: NO GROWTH
Special Requests: ADEQUATE
Special Requests: ADEQUATE

## 2021-08-27 ENCOUNTER — Other Ambulatory Visit: Payer: Self-pay

## 2021-08-27 ENCOUNTER — Ambulatory Visit (INDEPENDENT_AMBULATORY_CARE_PROVIDER_SITE_OTHER): Payer: No Typology Code available for payment source

## 2021-08-27 DIAGNOSIS — I2699 Other pulmonary embolism without acute cor pulmonale: Secondary | ICD-10-CM | POA: Diagnosis not present

## 2021-08-27 DIAGNOSIS — I480 Paroxysmal atrial fibrillation: Secondary | ICD-10-CM

## 2021-08-27 DIAGNOSIS — Z5181 Encounter for therapeutic drug level monitoring: Secondary | ICD-10-CM

## 2021-08-27 DIAGNOSIS — Z952 Presence of prosthetic heart valve: Secondary | ICD-10-CM | POA: Diagnosis not present

## 2021-08-27 LAB — POCT INR: INR: 2.5 (ref 2.0–3.0)

## 2021-08-27 NOTE — Patient Instructions (Signed)
Continue taking Warfarin 1.5 tablets daily except for 1 tablet on Mondays. Recheck INR in 2 weeks. [facility] [phone]

## 2021-08-30 ENCOUNTER — Encounter: Payer: Self-pay | Admitting: Hematology & Oncology

## 2021-08-30 ENCOUNTER — Encounter: Payer: Self-pay | Admitting: Family Medicine

## 2021-08-30 NOTE — Telephone Encounter (Signed)
Patient is scheduled   

## 2021-09-03 NOTE — Progress Notes (Signed)
Phone [phone] In person visit   Subjective:   [name] is a 75 y.o. year old very pleasant male patient who presents for/with See problem oriented charting Chief Complaint  Patient presents with   Hospitalization Follow-up    This visit occurred during the SARS-CoV-2 public health emergency.  Safety protocols were in place, including screening questions prior to the visit, additional usage of staff PPE, and extensive cleaning of exam room while observing appropriate contact time as indicated for disinfecting solutions.   Past Medical History-  Patient Active Problem List   Diagnosis Date Noted   S/P Bioprosthetic AVR (aortic valve replacement) in 2011 03/22/2015    Priority: 1.   Recurrent pulmonary embolism ([place]) 11/09/2014    Priority: 1.   Chronic lymphocytic leukemia ([place]) 11/11/2013    Priority: 1.   Atrial fibrillation ([place]) 04/20/2007    Priority: 1.   PROSTATE CANCER, HX OF 04/20/2007    Priority: 1.   Aortic atherosclerosis ([place]) 07/25/2021    Priority: 2.   PTSD (post-traumatic stress disorder) 09/29/2018    Priority: 2.   BPPV (benign paroxysmal positional vertigo) 09/04/2017    Priority: 2.   CKD (chronic kidney disease), stage III ([place]) 08/11/2017    Priority: 2.   Cervical spondylosis     Priority: 2.   Migraine 10/08/2010    Priority: 2.   Insomnia 02/03/2008    Priority: 2.   Anxiety state 09/06/2007    Priority: 2.   HLD (hyperlipidemia) 04/20/2007    Priority: 2.   Essential hypertension 04/20/2007    Priority: 2.   Former smoker 08/29/2016    Priority: 3.   Lung nodule 11/02/2014    Priority: 3.   GERD 12/11/2009    Priority: 3.   MITRAL REGURGITATION 04/07/2009    Priority: 3.   Febrile neutropenia ([place]) 08/19/2021   Pneumonia of right lower lobe due to infectious organism    Neutropenic fever (HCC)    FUO (fever of unknown origin)    Pancytopenia ([place])    Positive blood culture 07/09/2021   Anticoagulants causing adverse  effect in therapeutic use 06/22/2021   Encounter for therapeutic drug monitoring 09/21/2019   History of pulmonary embolism 03/07/2015    Medications- reviewed and updated Current Outpatient Medications  Medication Sig Dispense Refill   Cholecalciferol (VITAMIN D3) 50 MCG (2000 UT) capsule Take 2,000 Units by mouth every morning.     [employer] MOTION SICKNESS RELIEF 25 MG CHEW CHEW 1 TABLET EVERY DAY AS NEEDED FOR DIZZINESS (Patient taking differently: Chew 25 mg by mouth every morning.) 32 tablet 2   docusate sodium (COLACE) 100 MG capsule Take 100 mg by mouth daily as needed (constipation).     hydrochlorothiazide (HYDRODIURIL) 12.5 MG tablet TAKE 1 TABLET BY MOUTH EVERY DAY (Patient taking differently: Take 12.5 mg by mouth every morning.) 90 tablet 1   ipratropium (ATROVENT) 0.03 % nasal spray Place 2 sprays into both nostrils every 12 (twelve) hours. (Patient taking differently: Place 2 sprays into both nostrils 2 (two) times daily as needed for rhinitis (congestion).) 30 mL 12   LORazepam (ATIVAN) 0.5 MG tablet Take 1 tablet (0.5 mg total) by mouth at bedtime as needed for anxiety (can take 1/4 or 1/2 tablet  if effective.). (Patient taking differently: Take 0.125-0.5 mg by mouth at bedtime as needed for anxiety.) 30 tablet 2   omeprazole (PRILOSEC) 40 MG capsule Take 40 mg by mouth every evening.     rosuvastatin (CRESTOR) 10  MG tablet TAKE 1 TABLET BY MOUTH EVERY DAY (Patient taking differently: Take 10 mg by mouth at bedtime.) 90 tablet 3   sertraline (ZOLOFT) 100 MG tablet Take 100 mg by mouth every morning.     sodium chloride (OCEAN) 0.65 % SOLN nasal spray Place 1 spray into both nostrils as needed for congestion.     warfarin (COUMADIN) 5 MG tablet TAKE AS DIRECTED BY COUMADIN CLINIC (Patient taking differently: Take 5-7.5 mg by mouth See admin instructions. Take 7.5 mg by mouth in the evening on Sun/Tues/Wed/Thurs/Fri/Sat and 5 mg on Mondays) 160 tablet 1   venetoclax (VENCLEXTA) 100 MG  tablet Take 1 tablet (100 mg total) by mouth daily. Tablets should be swallowed whole with a meal and a full glass of water. (Patient not taking: No sig reported) 30 tablet 4   No current facility-administered medications for this visit.     Objective:  BP 112/66   Pulse 74   Temp 97.9 F (36.6 C)   Ht 5\' 10"  (1.778 m)   Wt 191 lb 6.4 oz (86.8 kg)   SpO2 97%   BMI 27.46 kg/m  Gen: NAD, resting comfortably TM normal, nasal turbinates normal-septal deviation noted.  Oropharynx largely normal CV: RRR  stable murmur Lungs: CTAB no crackles, wheeze, rhonchi Abdomen: soft/nontender/nondistended/normal bowel sounds. No rebound or guarding.  Ext: no edema Skin: warm, dry, no obvious rash     Assessment and Plan   # Hospitalized F/U Febrile Nuetropenia S:He presented in the ED on 10/09/202 with generalized weakness and fevers at home. He was admitted for neutropenic fevers. He started on broad-spectrum antibiotics with vancomycin and cefepime. Oncology was consulted and given a dose of neupogen. Denied any abdominal symptoms or rashes.    - Infectious source work-up including chest x-ray, urine culture, urine urinalysis, blood cultures were negative.    -Telephone call on 08/22/2021- reported feeling drained, sleeping a lot, and weak. He was given Neulasta.  -He reported, after leaving the hospital, not much had changed.   Patient presented to the emergency room yesterday per our request-had decreased energy and nausea as well as fever.  He also contacted oncology who recommended visit to the emergency room.  Patient had a good response to Neupogen 2 weeks prior and reported feeling reasonably well until day prior to visit-he was slightly hyponatremic and GFR was noted at 43, slightly anemic, slight thrombocytopenia, urine dipstick with small hemoglobin but urine microscopic without blood-had blood cultures x2, lactic acid (came back normal), lipase, chest x-ray ordered with no pneumonia.   Patient received a fluid bolus.  Respiratory viral panel including COVID and flu was negative.  Lipase was normal.  Blood cultures are still pending  Concern was for neutropenic fever.  He was minimally tachycardic but this was thought due to fever.  Blood pressure was low initially but improved with no intervention (later received fluid bolus)-sepsis was doubted.  Today reports had a fever again this morning- took tylenol and hydrated and has not recurred yet. Feeling run down. Fluids helped him feel better yesterday. No increased cough from baseline. Also had some nausea this AM. No diarrhea. No chest pain A/P: Patient with history of neutropenic fever in light of prior CLL treatment and did require hospitalization in early October for neutropenic fever (have recovered well from this-ultimately with no clear source other than possibly viral)-thankfully no neutropenia when checked out in the ER last night-I explained reasoning for ER trip needed to ensure prompt blood work and  work-up in case there was neutropenia when I was informed about his situation late afternoon yesterday.  I would certainly be open to an alternate pathway of outpatient treatment but I would need some guidance from [name] on this. -Extensive ER work-up without obvious bacterial infection-possible viral illness causing symptoms-seems to have gradual improvement-he agrees to continue to update me since [name] is out of the office this week-we did discuss possibility of repeating chest x-ray if progressive cough or new shortness of breath  #CLL-patient remains currently off venetoclax at present.  He will continue close follow-up with [name] and restart if possible/needed  # Atrial fibrillation-followed with cardiology #Recurrent pulmonary embolism-on chronic Coumadin #Status post bioprosthetic AVR-stable murmur S: Rate controlled without medication Anticoagulated with Coumadin-changed to this by cardiology due to  thrombus on AVR A/P: Appropriately anticoagulated-rate controlled without medicine.  Continue current medication.  With chronic Coumadin-doubt pulmonary embolism and no current shortness of breath-did recheck oxygen levels initially at 93% but came back to 97%   #hypertension S: medication: Hydrochlorothiazide 12.5 mg daily BP Readings from Last 3 Encounters:  09/05/21 112/66  09/04/21 130/82  08/24/21 134/80  A/P: blood pressure running lower for him than normal- I do not suspect sepsis but with illness discussed holding  hydrochlorothiazide until he is feeling better.    Recommended follow up: As needed for new or worsening symptoms Future Appointments  Date Time Provider [place]  09/06/2021  1:00 PM Oren Binet, PhD [facility] None  09/10/2021 11:45 AM [facility]  09/10/2021  3:00 PM [facility] None  11/19/2021  1:00 PM [name], MD [facility]  01/11/2022 10:00 AM Josue Hector, MD [facility]  01/21/2022  1:00 PM [facility] HEALTH COACH [facility]   Lab/Order associations:   ICD-10-CM   1. Fever, unspecified fever cause  R50.9     2. History of neutropenia  Z86.2     3. Essential hypertension  I10     4. Chronic lymphocytic leukemia (HCC)  C91.10     5. Atrial fibrillation, unspecified type ([place])  I48.91     6. Recurrent pulmonary embolism ([place])  I26.99      I,Jada Bradford,acting as a scribe for Garret Reddish, MD.,have documented all relevant documentation on the behalf of Garret Reddish, MD,as directed by  Garret Reddish, MD while in the presence of Garret Reddish, MD.   I, Garret Reddish, MD, have reviewed all documentation for this visit. The documentation on 09/05/21 for the exam, diagnosis, procedures, and orders are all accurate and complete.  Return precautions advised.  Garret Reddish, MD

## 2021-09-04 ENCOUNTER — Emergency Department (HOSPITAL_BASED_OUTPATIENT_CLINIC_OR_DEPARTMENT_OTHER)
Admission: EM | Admit: 2021-09-04 | Discharge: 2021-09-04 | Disposition: A | Discharge status: Home / Self Care | Payer: Medicare Other | Attending: Emergency Medicine | Admitting: Emergency Medicine

## 2021-09-04 ENCOUNTER — Emergency Department (HOSPITAL_BASED_OUTPATIENT_CLINIC_OR_DEPARTMENT_OTHER): Payer: Medicare Other | Admitting: Radiology

## 2021-09-04 ENCOUNTER — Telehealth: Payer: Self-pay

## 2021-09-04 ENCOUNTER — Encounter (HOSPITAL_BASED_OUTPATIENT_CLINIC_OR_DEPARTMENT_OTHER): Payer: Self-pay | Admitting: Emergency Medicine

## 2021-09-04 ENCOUNTER — Other Ambulatory Visit: Payer: Self-pay

## 2021-09-04 DIAGNOSIS — R509 Fever, unspecified: Secondary | ICD-10-CM | POA: Diagnosis not present

## 2021-09-04 DIAGNOSIS — Z79899 Other long term (current) drug therapy: Secondary | ICD-10-CM | POA: Insufficient documentation

## 2021-09-04 DIAGNOSIS — R531 Weakness: Secondary | ICD-10-CM | POA: Insufficient documentation

## 2021-09-04 DIAGNOSIS — N183 Chronic kidney disease, stage 3 unspecified: Secondary | ICD-10-CM | POA: Diagnosis not present

## 2021-09-04 DIAGNOSIS — Z7901 Long term (current) use of anticoagulants: Secondary | ICD-10-CM | POA: Diagnosis not present

## 2021-09-04 DIAGNOSIS — Z87891 Personal history of nicotine dependence: Secondary | ICD-10-CM | POA: Diagnosis not present

## 2021-09-04 DIAGNOSIS — Z20822 Contact with and (suspected) exposure to covid-19: Secondary | ICD-10-CM | POA: Diagnosis not present

## 2021-09-04 DIAGNOSIS — I129 Hypertensive chronic kidney disease with stage 1 through stage 4 chronic kidney disease, or unspecified chronic kidney disease: Secondary | ICD-10-CM | POA: Diagnosis not present

## 2021-09-04 DIAGNOSIS — Z8546 Personal history of malignant neoplasm of prostate: Secondary | ICD-10-CM | POA: Diagnosis not present

## 2021-09-04 DIAGNOSIS — R110 Nausea: Secondary | ICD-10-CM | POA: Insufficient documentation

## 2021-09-04 DIAGNOSIS — I4891 Unspecified atrial fibrillation: Secondary | ICD-10-CM | POA: Diagnosis not present

## 2021-09-04 LAB — URINALYSIS, ROUTINE W REFLEX MICROSCOPIC
Bilirubin Urine: NEGATIVE
Glucose, UA: NEGATIVE mg/dL
Ketones, ur: NEGATIVE mg/dL
Leukocytes,Ua: NEGATIVE
Nitrite: NEGATIVE
Protein, ur: NEGATIVE mg/dL
Specific Gravity, Urine: 1.021 (ref 1.005–1.030)
pH: 6 (ref 5.0–8.0)

## 2021-09-04 LAB — COMPREHENSIVE METABOLIC PANEL
ALT: 14 U/L (ref 0–44)
AST: 15 U/L (ref 15–41)
Albumin: 4 g/dL (ref 3.5–5.0)
Alkaline Phosphatase: 123 U/L (ref 38–126)
Anion gap: 9 (ref 5–15)
BUN: 25 mg/dL — ABNORMAL HIGH (ref 8–23)
CO2: 24 mmol/L (ref 22–32)
Calcium: 9.3 mg/dL (ref 8.9–10.3)
Chloride: 98 mmol/L (ref 98–111)
Creatinine, Ser: 1.65 mg/dL — ABNORMAL HIGH (ref 0.61–1.24)
GFR, Estimated: 43 mL/min — ABNORMAL LOW (ref >60)
Glucose, Bld: 132 mg/dL — ABNORMAL HIGH (ref 70–99)
Potassium: 3.6 mmol/L (ref 3.5–5.1)
Sodium: 131 mmol/L — ABNORMAL LOW (ref 135–145)
Total Bilirubin: 0.5 mg/dL (ref 0.3–1.2)
Total Protein: 6.5 g/dL (ref 6.5–8.1)

## 2021-09-04 LAB — CBC WITH DIFFERENTIAL/PLATELET
Abs Immature Granulocytes: 0.04 10*3/uL (ref 0.00–0.07)
Basophils Absolute: 0 10*3/uL (ref 0.0–0.1)
Basophils Relative: 0 %
Eosinophils Absolute: 0 10*3/uL (ref 0.0–0.5)
Eosinophils Relative: 0 %
HCT: 34.9 % — ABNORMAL LOW (ref 39.0–52.0)
Hemoglobin: 11.4 g/dL — ABNORMAL LOW (ref 13.0–17.0)
Immature Granulocytes: 1 %
Lymphocytes Relative: 8 %
Lymphs Abs: 0.5 10*3/uL — ABNORMAL LOW (ref 0.7–4.0)
MCH: 30.4 pg (ref 26.0–34.0)
MCHC: 32.7 g/dL (ref 30.0–36.0)
MCV: 93.1 fL (ref 80.0–100.0)
Monocytes Absolute: 0.7 10*3/uL (ref 0.1–1.0)
Monocytes Relative: 11 %
Neutro Abs: 5 10*3/uL (ref 1.7–7.7)
Neutrophils Relative %: 80 %
Platelets: 129 10*3/uL — ABNORMAL LOW (ref 150–400)
RBC: 3.75 MIL/uL — ABNORMAL LOW (ref 4.22–5.81)
RDW: 14.5 % (ref 11.5–15.5)
WBC: 6.3 10*3/uL (ref 4.0–10.5)
nRBC: 0 % (ref 0.0–0.2)

## 2021-09-04 LAB — PROTIME-INR
INR: 2.4 — ABNORMAL HIGH (ref 0.8–1.2)
Prothrombin Time: 26.2 seconds — ABNORMAL HIGH (ref 11.4–15.2)

## 2021-09-04 LAB — RESP PANEL BY RT-PCR (FLU A&B, COVID) ARPGX2
Influenza A by PCR: NEGATIVE
Influenza B by PCR: NEGATIVE
SARS Coronavirus 2 by RT PCR: NEGATIVE

## 2021-09-04 LAB — LACTIC ACID, PLASMA: Lactic Acid, Venous: 1.2 mmol/L (ref 0.5–1.9)

## 2021-09-04 LAB — LIPASE, BLOOD: Lipase: 40 U/L (ref 11–51)

## 2021-09-04 MED ORDER — LACTATED RINGERS IV BOLUS
1000.0000 mL | Freq: Once | INTRAVENOUS | Status: AC
  Administered 2021-09-04: 1000 mL via INTRAVENOUS

## 2021-09-04 NOTE — ED Triage Notes (Signed)
Pt reports having neutropenia due to chemo. Reports fever today with nausea. Denies vomiting and diarrhea.

## 2021-09-04 NOTE — Telephone Encounter (Signed)
Patient called stating he has had fevers of 100 to 101 with no relief and same symptoms as last time.  States he called Dr.Hunter, his PCP who advised him to go to ED but wanted to see what Dr.Ennever could advise. Called patient back and informed him Dr.Ennever was out of office this week and also advised to go to ED or urgent care. Patient verbalized and states he will go to ED.

## 2021-09-04 NOTE — Discharge Instructions (Signed)
Your work-up today was overall reassuring.  While you did show up with a fever and a high heart rate, this improved with Tylenol.  Continue to take as needed for fevers. There is no obvious source of infection.  Your COVID, flu, urine, chest x-ray did not show any signs of infection. Your white count and neutrophil numbers were normal, indicating your body should be able to fight off this infection. Follow-up closely with your primary care doctor, go tomorrow to schedule appointment. You have blood cultures pending.  If they are positive, you will see the phone call instructing you to return to the ER for further evaluation. Return with any new, worsening, concerning symptoms

## 2021-09-04 NOTE — ED Provider Notes (Signed)
[facility] EMERGENCY DEPT Provider Note   CSN: [id number] Arrival date & time: 09/04/21  1528     History Chief Complaint  Patient presents with   Fever    [name] is a 75 y.o. male presenting for evaluation of fever.  Patient states he started to feel poorly yesterday. He had decreased energy and nausea.  Today he developed a fever, called his oncologist who recommended he come to the ER.  Patient was admitted 2 wks ago for similar, at the time had febrile neutropenia.  He was given Neupogen by his oncologist, had a good response.  Was feeling well until yesterday.  He denies cough, chest pain, shortness of breath, abdominal pain, urinary symptoms, abnormal bowel movements.  He has a history of CLL and prostate cancer, is not currently on any antineoplastic, chemo, radiation.  He denies sick contacts.  Additional history obtained in chart review.  History of aortic regurg status post AVR, CLL, history of prostate cancer status post radiation, history of PE on Coumadin, hypertension, hyperlipidemia, PAF.   HPI     Past Medical History:  Diagnosis Date   Anticoagulants causing adverse effect in therapeutic use    Anxiety    Paxil seemed to cause odd neuro side effects; better on prozac as of 09/2015   Aortic regurgitation 04/2007 surgery   Resolved with tissue AVR at [facility]   Cervical spondylosis    ESI by [name] in [place]. 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 (most recent 07/2017)   History of prostate cancer 2003   Rad prost   History of pulmonary embolism 2011; 10/2014   Recurrence off of anticoagulation 10/2014: per hem/onc ([name]) pt needs lifelong anticoagulation (hypercoag w/u neg).   Hyperlipidemia    statin-intolerant, except for crestor low dose.   Hypertension    Migraine syndrome    Mitral regurgitation    PAF (paroxysmal atrial fibrillation)  (HCC)    Panic attacks    "           "             "                  "                  "                       "                             "                          Pulmonary nodule, right 2015   RLL: resected at [facility] --VATS; Benign RLL nodule (fungal and AFB stains neg).  Plan is for [facility] thoracic surg to do f/u o/v & CT chest 1 yr    Right-sided headache 07/2016   Primary stabbing HA or migraine per neuro (Dr. Tomi Likens).  Gabapentin and topamax not tolerated.  These resolved spontaneously.    Patient Active Problem List   Diagnosis Date Noted   Febrile neutropenia ([place]) 08/19/2021   Aortic atherosclerosis ([place]) 07/25/2021   Pneumonia of right lower lobe due to infectious organism    Neutropenic fever ([place])  FUO (fever of unknown origin)    Pancytopenia ([place])    Positive blood culture 07/09/2021   Anticoagulants causing adverse effect in therapeutic use 06/22/2021   Encounter for therapeutic drug monitoring 09/21/2019   PTSD (post-traumatic stress disorder) 09/29/2018   BPPV (benign paroxysmal positional vertigo) 09/04/2017   CKD (chronic kidney disease), stage III ([place]) 08/11/2017   Former smoker 08/29/2016   Cervical spondylosis    S/P Bioprosthetic AVR (aortic valve replacement) in 2011 03/22/2015   History of pulmonary embolism 03/07/2015   Recurrent pulmonary embolism ([place]) 11/09/2014   Lung nodule 11/02/2014   Chronic lymphocytic leukemia ([place]) 11/11/2013   Migraine 10/08/2010   GERD 12/11/2009   MITRAL REGURGITATION 04/07/2009   Insomnia 02/03/2008   Anxiety state 09/06/2007   HLD (hyperlipidemia) 04/20/2007   Essential hypertension 04/20/2007   Atrial fibrillation ([place]) 04/20/2007   PROSTATE CANCER, HX OF 04/20/2007    Past Surgical History:  Procedure Laterality Date   AORTIC VALVE REPLACEMENT  2011   [place]  (bioprosthetic)   CARDIAC CATHETERIZATION  04/2010   Normal coronaries.   Carotid dopplers  08/2015   NORMAL   CATARACT EXTRACTION     L 12/06/09   R  09/14/09   COLONOSCOPY  01/26/13   tic's, o/w normal.  ([place])--recall 10 yrs.   PENILE PROSTHESIS IMPLANT     PROSTATECTOMY  04/2002   for prostate cancer   Pulmonary nodule resection  Fall/winter 2016   [facility]   SHOULDER SURGERY     rotator cuff on both arms    TEE WITHOUT CARDIOVERSION N/A 07/13/2021   Procedure: TRANSESOPHAGEAL ECHOCARDIOGRAM (TEE);  Surgeon: Pixie Casino, MD;  Location: [facility] ENDOSCOPY;  Service: Cardiovascular;  Laterality: N/A;   TIBIALIS TENDON TRANSFER / REPAIR Right 03/02/2019   Procedure: RECONSTRUCTION OF RIGHT  ANTERIOR TIBIALIS TENDON;  Surgeon: Erle Crocker, MD;  Location: [facility];  Service: Orthopedics;  Laterality: Right;   TRANSTHORACIC ECHOCARDIOGRAM  09/20/2014; 03/2016; 03/2017   2015-mild LVH, EF 50-55%, wall motion nl, grade I diast dysfxn, prosth aort valve good,transaortic gradients decreased compared to echo 11/2013.   03/2016: EF 55-60%, normal LV function, severe LVH, normal diast fxn, mild increase in AV gradient compared to 09/2014 echo.  2018: EF 55-60%, normal LV fxn, grd II DD, AV stable/gradient's stable.       Family History  Problem Relation Age of Onset   Cancer Sister        uterine   Colon cancer Sister 54   Stroke Mother    Prostate cancer Father    Stroke Sister    Stroke Brother    Heart attack Neg Hx     Social History   Tobacco Use   Smoking status: Former    Packs/day: 1.00    Years: 12.00    Pack years: 12.00    Types: Cigarettes    Start date: 11/28/1968    Quit date: 01/29/1981    Years since quitting: 40.6   Smokeless tobacco: Never   Tobacco comments:    quit 75 yo   Vaping Use   Vaping Use: Never used  Substance Use Topics   Alcohol use: Not Currently    Comment: occasional   Drug use: No    Home Medications Prior to Admission medications   Medication Sig Start Date End Date Taking? Authorizing Provider  Cholecalciferol (VITAMIN D3) 50 MCG (2000 UT) capsule Take 2,000 Units by  mouth every morning.    [provider]  [facility]  MOTION SICKNESS RELIEF 25 MG CHEW CHEW 1 TABLET EVERY DAY AS NEEDED FOR DIZZINESS Patient taking differently: Chew 25 mg by mouth every morning. 06/06/21   [name], MD  docusate sodium (COLACE) 100 MG capsule Take 100 mg by mouth daily as needed (constipation).    [provider]  hydrochlorothiazide (HYDRODIURIL) 12.5 MG tablet TAKE 1 TABLET BY MOUTH EVERY DAY Patient taking differently: Take 12.5 mg by mouth every morning. 04/04/21   [name], MD  ipratropium (ATROVENT) 0.03 % nasal spray Place 2 sprays into both nostrils every 12 (twelve) hours. Patient taking differently: Place 2 sprays into both nostrils 2 (two) times daily as needed for rhinitis (congestion). 10/04/20   Inda Coke, PA  LORazepam (ATIVAN) 0.5 MG tablet Take 1 tablet (0.5 mg total) by mouth at bedtime as needed for anxiety (can take 1/4 or 1/2 tablet  if effective.). Patient taking differently: Take 0.125-0.5 mg by mouth at bedtime as needed for anxiety. 08/02/21   [name], MD  omeprazole (PRILOSEC) 40 MG capsule Take 40 mg by mouth every evening. 10/18/20   [provider]  rosuvastatin (CRESTOR) 10 MG tablet TAKE 1 TABLET BY MOUTH EVERY DAY Patient taking differently: Take 10 mg by mouth at bedtime. 11/01/20   [name], MD  sertraline (ZOLOFT) 100 MG tablet Take 100 mg by mouth every morning.    [provider]  sodium chloride (OCEAN) 0.65 % SOLN nasal spray Place 1 spray into both nostrils as needed for congestion.    [provider]  venetoclax (VENCLEXTA) 100 MG tablet Take 1 tablet (100 mg total) by mouth daily. Tablets should be swallowed whole with a meal and a full glass of water. Patient not taking: No sig reported 07/05/21   Volanda Napoleon, MD  warfarin (COUMADIN) 5 MG tablet TAKE AS DIRECTED BY COUMADIN CLINIC Patient taking differently: Take 5-7.5 mg by mouth See admin instructions.  Take 7.5 mg by mouth in the evening on Sun/Tues/Wed/Thurs/Fri/Sat and 5 mg on Mondays 03/19/21   Josue Hector, MD    Allergies    Hydrocodone-acetaminophen, Oxycodone hcl, Telmisartan-hctz, Ramipril, Aspirin, Atorvastatin, Buspirone hcl, Codeine, Morphine sulfate, Paroxetine, and Rabeprazole  Review of Systems   Review of Systems  Constitutional:  Positive for fever.  Gastrointestinal:  Positive for nausea.  Neurological:  Positive for weakness.  All other systems reviewed and are negative.  Physical Exam Updated Vital Signs BP 125/89   Pulse 70   Temp 99.2 F (37.3 C)   Resp 19   Ht 5\' 10"  (1.778 m)   Wt 86.2 kg   SpO2 97%   BMI 27.26 kg/m   Physical Exam Vitals and nursing note reviewed.  Constitutional:      General: He is not in acute distress.    Appearance: Normal appearance.     Comments: Resting in the bed in no acute distress  HENT:     Head: Normocephalic and atraumatic.  Eyes:     Conjunctiva/sclera: Conjunctivae normal.     Pupils: Pupils are equal, round, and reactive to light.  Cardiovascular:     Rate and Rhythm: Normal rate and regular rhythm.     Pulses: Normal pulses.     Comments: Heart rate in the upper 90s on my exam Pulmonary:     Effort: Pulmonary effort is normal. No respiratory distress.     Breath sounds: Normal breath sounds. No wheezing.     Comments: Speaking in full sentences.  Clear lung sounds in all fields. Abdominal:     General: There is no distension.     Palpations: Abdomen is soft. There is no mass.     Tenderness: There is no abdominal tenderness. There is no guarding or rebound.     Comments: No TTP of the abdomen  Musculoskeletal:        General: Normal range of motion.     Cervical back: Normal range of motion and neck supple.  Skin:    General: Skin is warm and dry.     Capillary Refill: Capillary refill takes less than 2 seconds.  Neurological:     Mental Status: He is alert and oriented to person, place, and time.   Psychiatric:        Mood and Affect: Mood and affect normal.        Speech: Speech normal.        Behavior: Behavior normal.    ED Results / Procedures / Treatments   Labs (all labs ordered are listed, but only abnormal results are displayed) Labs Reviewed  COMPREHENSIVE METABOLIC PANEL - Abnormal; Notable for the following components:      Result Value   Sodium 131 (*)    Glucose, Bld 132 (*)    BUN 25 (*)    Creatinine, Ser 1.65 (*)    GFR, Estimated 43 (*)    All other components within normal limits  CBC WITH DIFFERENTIAL/PLATELET - Abnormal; Notable for the following components:   RBC 3.75 (*)    Hemoglobin 11.4 (*)    HCT 34.9 (*)    Platelets 129 (*)    Lymphs Abs 0.5 (*)    All other components within normal limits  PROTIME-INR - Abnormal; Notable for the following components:   Prothrombin Time 26.2 (*)    INR 2.4 (*)    All other components within normal limits  URINALYSIS, ROUTINE W REFLEX MICROSCOPIC - Abnormal; Notable for the following components:   Hgb urine dipstick SMALL (*)    All other components within normal limits  CULTURE, BLOOD (ROUTINE X 2)  CULTURE, BLOOD (ROUTINE X 2)  RESP PANEL BY RT-PCR (FLU A&B, COVID) ARPGX2  LACTIC ACID, PLASMA  LIPASE, BLOOD    EKG None  Radiology DG Chest 2 View  Result Date: 09/04/2021 CLINICAL DATA:  Sepsis EXAM: CHEST - 2 VIEW COMPARISON:  CT examination dated October 18, 2020 FINDINGS: The heart size and mediastinal contours are within normal limits. Atherosclerotic calcifications of the aortic arch. Prosthetic aortic valve. Both lungs are clear. The visualized skeletal structures are unremarkable. Sternotomy wires are intact. IMPRESSION: No active cardiopulmonary disease. Electronically Signed   By: Keane Police D.O.   On: 09/04/2021 16:52    Procedures Procedures   Medications Ordered in ED Medications  lactated ringers bolus 1,000 mL (0 mLs Intravenous Stopped 09/04/21 1833)    ED Course  I have  reviewed the triage vital signs and the nursing notes.  Pertinent labs & imaging results that were available during my care of the patient were reviewed by me and considered in my medical decision making (see chart for details).    MDM Rules/Calculators/A&P                           Patient presenting for evaluation of nausea, fever, generalized weakness.  On exam, patient appears nontoxic.  He has a history of neutropenia.  Concern for neutropenic fever.  Also consider viral  or infectious cause.  Patient presented febrile up to 102.  He was minimally tachycardic, but this is likely secondary to fever and decreased p.o. intake.  Blood pressure soft, however without any intervention, blood pressure improved to 532D systolic, feel this was most likely positional.  Doubt sepsis at this time, however will obtain labs and order set for further evaluation.  Will give a liter of fluids, obtain chest x-ray and urine, and reassess.  Chest x-ray viewed and independently interpreted by me, no pneumonia, no for some effusion.  Labs overall reassuring.  No leukocytosis.  No neutropenia.  Lactic is normal.  Electrolytes are stable.  Kidney, liver, pancreatic function normal.  Urine negative for infection.  On reassessment, patient's fever has resolved as expected with Tylenol.  Heart rate is reassuring.  Patient remains very well-appearing.  Case discussed with attending, Dr. Sherry Ruffing evaluated the patient.  Feel this is most likely a viral illness as her is no clear bacterial cause at this time.  Patient is aware that her blood cultures pending.  He has an appointment with his PCP already scheduled for tomorrow, will encourage him to keep this.  At this time, patient appears safe for discharge.  Return precautions given.  Patient states he understands and agrees to plan.  Final Clinical Impression(s) / ED Diagnoses Final diagnoses:  Fever, unspecified fever cause    Rx / DC Orders ED Discharge Orders     None         Franchot Heidelberg, PA-C 09/04/21 1909    Tegeler, Gwenyth Allegra, MD 09/05/21 [phone]

## 2021-09-05 ENCOUNTER — Encounter: Payer: Self-pay | Admitting: Family Medicine

## 2021-09-05 ENCOUNTER — Ambulatory Visit (INDEPENDENT_AMBULATORY_CARE_PROVIDER_SITE_OTHER): Payer: No Typology Code available for payment source | Admitting: Family Medicine

## 2021-09-05 VITALS — BP 112/66 | HR 74 | Temp 97.9°F | Ht 70.0 in | Wt 191.4 lb

## 2021-09-05 DIAGNOSIS — R509 Fever, unspecified: Secondary | ICD-10-CM | POA: Diagnosis not present

## 2021-09-05 DIAGNOSIS — I10 Essential (primary) hypertension: Secondary | ICD-10-CM

## 2021-09-05 DIAGNOSIS — I4891 Unspecified atrial fibrillation: Secondary | ICD-10-CM

## 2021-09-05 DIAGNOSIS — Z862 Personal history of diseases of the blood and blood-forming organs and certain disorders involving the immune mechanism: Secondary | ICD-10-CM | POA: Diagnosis not present

## 2021-09-05 DIAGNOSIS — C9110 Chronic lymphocytic leukemia of B-cell type not having achieved remission: Secondary | ICD-10-CM | POA: Diagnosis not present

## 2021-09-05 DIAGNOSIS — I700 Atherosclerosis of aorta: Secondary | ICD-10-CM

## 2021-09-05 DIAGNOSIS — I2699 Other pulmonary embolism without acute cor pulmonale: Secondary | ICD-10-CM

## 2021-09-05 DIAGNOSIS — E785 Hyperlipidemia, unspecified: Secondary | ICD-10-CM

## 2021-09-05 DIAGNOSIS — F411 Generalized anxiety disorder: Secondary | ICD-10-CM

## 2021-09-05 NOTE — Patient Instructions (Addendum)
Hold off your blood pressure medication, hydrochlorothiazide 12.5 mg, for now until you feel better.   Recommended follow up: As needed follow-up for new or worsening or persistent symptoms.   I hope you feel better! Keep me updated over next few days. If more cough or shortness of breath can get another x-ray

## 2021-09-06 ENCOUNTER — Ambulatory Visit (INDEPENDENT_AMBULATORY_CARE_PROVIDER_SITE_OTHER): Payer: Medicare Other | Admitting: Psychology

## 2021-09-06 ENCOUNTER — Encounter: Payer: Self-pay | Admitting: Family Medicine

## 2021-09-06 DIAGNOSIS — F4321 Adjustment disorder with depressed mood: Secondary | ICD-10-CM

## 2021-09-08 ENCOUNTER — Emergency Department (HOSPITAL_BASED_OUTPATIENT_CLINIC_OR_DEPARTMENT_OTHER): Payer: No Typology Code available for payment source

## 2021-09-08 ENCOUNTER — Emergency Department (HOSPITAL_BASED_OUTPATIENT_CLINIC_OR_DEPARTMENT_OTHER)
Admission: EM | Admit: 2021-09-08 | Discharge: 2021-09-08 | Disposition: A | Discharge status: Home / Self Care | Payer: No Typology Code available for payment source | Attending: Emergency Medicine | Admitting: Emergency Medicine

## 2021-09-08 ENCOUNTER — Other Ambulatory Visit: Payer: Self-pay

## 2021-09-08 ENCOUNTER — Encounter (HOSPITAL_BASED_OUTPATIENT_CLINIC_OR_DEPARTMENT_OTHER): Payer: Self-pay | Admitting: Emergency Medicine

## 2021-09-08 DIAGNOSIS — R0981 Nasal congestion: Secondary | ICD-10-CM | POA: Diagnosis not present

## 2021-09-08 DIAGNOSIS — R5383 Other fatigue: Secondary | ICD-10-CM | POA: Insufficient documentation

## 2021-09-08 DIAGNOSIS — R197 Diarrhea, unspecified: Secondary | ICD-10-CM | POA: Diagnosis not present

## 2021-09-08 DIAGNOSIS — R509 Fever, unspecified: Secondary | ICD-10-CM | POA: Insufficient documentation

## 2021-09-08 DIAGNOSIS — I4891 Unspecified atrial fibrillation: Secondary | ICD-10-CM | POA: Insufficient documentation

## 2021-09-08 DIAGNOSIS — I129 Hypertensive chronic kidney disease with stage 1 through stage 4 chronic kidney disease, or unspecified chronic kidney disease: Secondary | ICD-10-CM | POA: Diagnosis not present

## 2021-09-08 DIAGNOSIS — R531 Weakness: Secondary | ICD-10-CM | POA: Diagnosis not present

## 2021-09-08 DIAGNOSIS — Z20822 Contact with and (suspected) exposure to covid-19: Secondary | ICD-10-CM | POA: Insufficient documentation

## 2021-09-08 DIAGNOSIS — R519 Headache, unspecified: Secondary | ICD-10-CM | POA: Insufficient documentation

## 2021-09-08 DIAGNOSIS — R0602 Shortness of breath: Secondary | ICD-10-CM | POA: Diagnosis not present

## 2021-09-08 DIAGNOSIS — N183 Chronic kidney disease, stage 3 unspecified: Secondary | ICD-10-CM | POA: Diagnosis not present

## 2021-09-08 DIAGNOSIS — Z8546 Personal history of malignant neoplasm of prostate: Secondary | ICD-10-CM | POA: Diagnosis not present

## 2021-09-08 DIAGNOSIS — Z87891 Personal history of nicotine dependence: Secondary | ICD-10-CM | POA: Insufficient documentation

## 2021-09-08 LAB — COMPREHENSIVE METABOLIC PANEL
ALT: 15 U/L (ref 0–44)
AST: 25 U/L (ref 15–41)
Albumin: 3.8 g/dL (ref 3.5–5.0)
Alkaline Phosphatase: 86 U/L (ref 38–126)
Anion gap: 9 (ref 5–15)
BUN: 24 mg/dL — ABNORMAL HIGH (ref 8–23)
CO2: 27 mmol/L (ref 22–32)
Calcium: 9.1 mg/dL (ref 8.9–10.3)
Chloride: 98 mmol/L (ref 98–111)
Creatinine, Ser: 1.73 mg/dL — ABNORMAL HIGH (ref 0.61–1.24)
GFR, Estimated: 41 mL/min — ABNORMAL LOW (ref >60)
Glucose, Bld: 109 mg/dL — ABNORMAL HIGH (ref 70–99)
Potassium: 3.6 mmol/L (ref 3.5–5.1)
Sodium: 134 mmol/L — ABNORMAL LOW (ref 135–145)
Total Bilirubin: 0.6 mg/dL (ref 0.3–1.2)
Total Protein: 6.6 g/dL (ref 6.5–8.1)

## 2021-09-08 LAB — URINALYSIS, ROUTINE W REFLEX MICROSCOPIC
Bilirubin Urine: NEGATIVE
Glucose, UA: NEGATIVE mg/dL
Ketones, ur: NEGATIVE mg/dL
Leukocytes,Ua: NEGATIVE
Nitrite: NEGATIVE
Protein, ur: 30 mg/dL — AB
Specific Gravity, Urine: 1.025 (ref 1.005–1.030)
pH: 5.5 (ref 5.0–8.0)

## 2021-09-08 LAB — CBC WITH DIFFERENTIAL/PLATELET
Abs Immature Granulocytes: 0.08 10*3/uL — ABNORMAL HIGH (ref 0.00–0.07)
Basophils Absolute: 0 10*3/uL (ref 0.0–0.1)
Basophils Relative: 1 %
Eosinophils Absolute: 0 10*3/uL (ref 0.0–0.5)
Eosinophils Relative: 0 %
HCT: 33.8 % — ABNORMAL LOW (ref 39.0–52.0)
Hemoglobin: 11.1 g/dL — ABNORMAL LOW (ref 13.0–17.0)
Immature Granulocytes: 3 %
Lymphocytes Relative: 10 %
Lymphs Abs: 0.2 10*3/uL — ABNORMAL LOW (ref 0.7–4.0)
MCH: 30.7 pg (ref 26.0–34.0)
MCHC: 32.8 g/dL (ref 30.0–36.0)
MCV: 93.6 fL (ref 80.0–100.0)
Monocytes Absolute: 0.3 10*3/uL (ref 0.1–1.0)
Monocytes Relative: 14 %
Neutro Abs: 1.7 10*3/uL (ref 1.7–7.7)
Neutrophils Relative %: 72 %
Platelets: 84 10*3/uL — ABNORMAL LOW (ref 150–400)
RBC: 3.61 MIL/uL — ABNORMAL LOW (ref 4.22–5.81)
RDW: 14.4 % (ref 11.5–15.5)
WBC: 2.4 10*3/uL — ABNORMAL LOW (ref 4.0–10.5)
nRBC: 0 % (ref 0.0–0.2)

## 2021-09-08 LAB — APTT: aPTT: 39 seconds — ABNORMAL HIGH (ref 24–36)

## 2021-09-08 LAB — PROTIME-INR
INR: 1.4 — ABNORMAL HIGH (ref 0.8–1.2)
Prothrombin Time: 17 seconds — ABNORMAL HIGH (ref 11.4–15.2)

## 2021-09-08 LAB — RESP PANEL BY RT-PCR (FLU A&B, COVID) ARPGX2
Influenza A by PCR: NEGATIVE
Influenza B by PCR: NEGATIVE
SARS Coronavirus 2 by RT PCR: NEGATIVE

## 2021-09-08 LAB — LACTIC ACID, PLASMA: Lactic Acid, Venous: 1.1 mmol/L (ref 0.5–1.9)

## 2021-09-08 MED ORDER — SODIUM CHLORIDE 0.9 % IV BOLUS
1000.0000 mL | Freq: Once | INTRAVENOUS | Status: AC
  Administered 2021-09-08: 1000 mL via INTRAVENOUS

## 2021-09-08 NOTE — ED Notes (Signed)
Dc instructions reviewed with patient.patient voiced understanding.,

## 2021-09-08 NOTE — Discharge Instructions (Signed)
Please follow-up with your oncologist on Monday as scheduled.  Continue drinking plenty of fluids and water like we discussed.  We did draw blood cultures today.  If cultures return positive for bacteria, you will be contacted regarding coming back to the hospital for IV antibiotics.  Otherwise, you should return to the hospital if you have worsening dizziness, new symptoms including difficulty breathing, chest pain, persistent vomiting or diarrhea, feel dehydrated or woozy.

## 2021-09-08 NOTE — ED Provider Notes (Signed)
Hx of CLL, presenting with home temp of 100.5F at home, reporting fatigue, 1 episode of diarrhea today, diminished PO intake  Patient hospitalized 2 weeks ago (discharge on 08/21/21) with similar symptoms, diagnosed ultimately viral syndrome, with negative blood cx.  Received neuopogen from oncologist while in hospital.  Urine unremarkable.    TEE last month with no visible vegetations per report, after hospitalization in Sept 2022 with 1 since set of positive blood cultures (staph hominis), later felt by ID to be a contaminant.  However he was treated with Augmentin for right sided pneumonia at that time.  5 pm - update - I spoke to Dr Burr Medico (oncology) and reviewed the case.  She does not feel strongly that the patient would need antibiotics or admission at this time if there is no localizing source.  She recommended that we follow-up on a UA.  With no hypoxia or respiratory symptoms, Dr Burr Medico did not recommend CT chest, but I felt that it would be reasonable to pursue this given the patient did have an asymptomatic pneumonia 2 months ago.    Patient is in agreement to complete this infectious work-up.  He reports that he needs to get home otherwise, as he has does have a puppy to take care of.  He feels well enough to go home.  We advise staying hydrated, and he already has an oncology appointment in 2 days on Monday with Dr. Elnoria Howard.  He understands he would be contacted with abnormal results including positive blood cultures, which would prompt return to the ED.    CT chest reviewed - no evident PNA or infection seen here.  Patient discharged home.   Wyvonnia Dusky, MD 09/09/21 1007

## 2021-09-08 NOTE — ED Provider Notes (Signed)
Emergency Department Provider Note   I have reviewed the triage vital signs and the nursing notes.   HISTORY  Chief Complaint Fever and Diarrhea   HPI [name] is a 75 y.o. male with past medical history reviewed below including CLL followed by [name] with recent neutropenia requiring Neupogen presents with significant fatigue, feeling off balance, shaking chills, and borderline fever at home.  Patient recorded T-max of 100 F earlier today with some chills and fatigue.  He is having some mild congestion along with mild shortness of breath.  He is not having pain in his chest or abdomen.  Immediately prior to presenting to the emergency department he had some nonbloody diarrhea but that has not been a persistent symptom for him.  He is compliant with his home medications.  He is not having vomiting.  Denies sore throat or headache.  He states typically he will monitor his temperature at home and if it gets over 101 he comes to the emergency department.  He states last night he got a temp of 100 F even and took a tylenol. When he woke up this AM he was feeling worse but had no recorded fever this AM.    Past Medical History:  Diagnosis Date   Anticoagulants causing adverse effect in therapeutic use    Anxiety    Paxil seemed to cause odd neuro side effects; better on prozac as of 09/2015   Aortic regurgitation 04/2007 surgery   Resolved with tissue AVR at [facility]   Cervical spondylosis    ESI by [name] in [place]. 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 (most recent 07/2017)   History of prostate cancer 2003   Rad prost   History of pulmonary embolism 2011; 10/2014   Recurrence off of anticoagulation 10/2014: per hem/onc ([name]) pt needs lifelong anticoagulation (hypercoag w/u neg).   Hyperlipidemia    statin-intolerant, except for crestor low dose.   Hypertension    Migraine  syndrome    Mitral regurgitation    PAF (paroxysmal atrial fibrillation) (HCC)    Panic attacks    "           "             "                  "                  "                       "                             "                          Pulmonary nodule, right 2015   RLL: resected at [facility] --VATS; Benign RLL nodule (fungal and AFB stains neg).  Plan is for [facility] thoracic surg to do f/u o/v & CT chest 1 yr    Right-sided headache 07/2016   Primary stabbing HA or migraine per neuro (Dr. Tomi Likens).  Gabapentin and topamax not tolerated.  These resolved spontaneously.    Patient Active Problem List   Diagnosis Date Noted   Febrile neutropenia ([place]) 08/19/2021   Aortic  atherosclerosis ([place]) 07/25/2021   Pneumonia of right lower lobe due to infectious organism    Neutropenic fever ([place])    FUO (fever of unknown origin)    Pancytopenia ([place])    Positive blood culture 07/09/2021   Anticoagulants causing adverse effect in therapeutic use 06/22/2021   Encounter for therapeutic drug monitoring 09/21/2019   PTSD (post-traumatic stress disorder) 09/29/2018   BPPV (benign paroxysmal positional vertigo) 09/04/2017   CKD (chronic kidney disease), stage III ([place]) 08/11/2017   Former smoker 08/29/2016   Cervical spondylosis    S/P Bioprosthetic AVR (aortic valve replacement) in 2011 03/22/2015   History of pulmonary embolism 03/07/2015   Recurrent pulmonary embolism ([place]) 11/09/2014   Lung nodule 11/02/2014   Chronic lymphocytic leukemia ([place]) 11/11/2013   Migraine 10/08/2010   GERD 12/11/2009   MITRAL REGURGITATION 04/07/2009   Insomnia 02/03/2008   Anxiety state 09/06/2007   HLD (hyperlipidemia) 04/20/2007   Essential hypertension 04/20/2007   Atrial fibrillation ([place]) 04/20/2007   PROSTATE CANCER, HX OF 04/20/2007    Past Surgical History:  Procedure Laterality Date   AORTIC VALVE REPLACEMENT  2011   [place]  (bioprosthetic)   CARDIAC CATHETERIZATION  04/2010   Normal coronaries.    Carotid dopplers  08/2015   NORMAL   CATARACT EXTRACTION     L 12/06/09   R 09/14/09   COLONOSCOPY  01/26/13   tic's, o/w normal.  ([place])--recall 10 yrs.   PENILE PROSTHESIS IMPLANT     PROSTATECTOMY  04/2002   for prostate cancer   Pulmonary nodule resection  Fall/winter 2016   [facility]   SHOULDER SURGERY     rotator cuff on both arms    TEE WITHOUT CARDIOVERSION N/A 07/13/2021   Procedure: TRANSESOPHAGEAL ECHOCARDIOGRAM (TEE);  Surgeon: Pixie Casino, MD;  Location: [facility] ENDOSCOPY;  Service: Cardiovascular;  Laterality: N/A;   TIBIALIS TENDON TRANSFER / REPAIR Right 03/02/2019   Procedure: RECONSTRUCTION OF RIGHT  ANTERIOR TIBIALIS TENDON;  Surgeon: Erle Crocker, MD;  Location: [facility];  Service: Orthopedics;  Laterality: Right;   TRANSTHORACIC ECHOCARDIOGRAM  09/20/2014; 03/2016; 03/2017   2015-mild LVH, EF 50-55%, wall motion nl, grade I diast dysfxn, prosth aort valve good,transaortic gradients decreased compared to echo 11/2013.   03/2016: EF 55-60%, normal LV function, severe LVH, normal diast fxn, mild increase in AV gradient compared to 09/2014 echo.  2018: EF 55-60%, normal LV fxn, grd II DD, AV stable/gradient's stable.    Allergies Hydrocodone-acetaminophen, Oxycodone hcl, Telmisartan-hctz, Ramipril, Aspirin, Atorvastatin, Buspirone hcl, Codeine, Morphine sulfate, Paroxetine, and Rabeprazole  Family History  Problem Relation Age of Onset   Cancer Sister        uterine   Colon cancer Sister 3   Stroke Mother    Prostate cancer Father    Stroke Sister    Stroke Brother    Heart attack Neg Hx     Social History Social History   Tobacco Use   Smoking status: Former    Packs/day: 1.00    Years: 12.00    Pack years: 12.00    Types: Cigarettes    Start date: 11/28/1968    Quit date: 01/29/1981    Years since quitting: 40.6   Smokeless tobacco: Never   Tobacco comments:    quit 75 yo   Vaping Use   Vaping Use: Never used  Substance Use Topics    Alcohol use: Not Currently    Comment: occasional   Drug use: No    Review of  Systems  Constitutional: Borderline fever, fatigue, and generalized weakness. Reports chills yesterday.  Eyes: No visual changes. ENT: No sore throat. Mild congestion.  Cardiovascular: Denies chest pain. Respiratory: Mild shortness of breath. No cough.  Gastrointestinal: No abdominal pain.  No nausea, no vomiting. Positive diarrhea.  No constipation. Genitourinary: Negative for dysuria. Musculoskeletal: Negative for back pain.  Skin: Negative for rash. Neurological: Negative for headaches, focal weakness or numbness.  10-point ROS otherwise negative.  ____________________________________________   PHYSICAL EXAM:  VITAL SIGNS: ED Triage Vitals  Enc Vitals Group     BP 09/08/21 1401 94/60     Pulse Rate 09/08/21 1401 76     Resp 09/08/21 1401 (!) 24     Temp 09/08/21 1401 98.1 F (36.7 C)     Temp Source 09/08/21 1401 Oral     SpO2 09/08/21 1402 98 %     Weight 09/08/21 1401 191 lb (86.6 kg)     Height 09/08/21 1401 5\' 10"  (1.778 m)   Constitutional: Alert and oriented. Well appearing and in no acute distress. Eyes: Conjunctivae are normal.  Head: Atraumatic. Nose: No congestion/rhinnorhea. Mouth/Throat: Mucous membranes are moist.   Neck: No stridor.   Cardiovascular: Normal rate, regular rhythm. Good peripheral circulation. Grossly normal heart sounds.   Respiratory: Normal respiratory effort.  No retractions. Lungs CTAB. Gastrointestinal: Soft and nontender. No distention.  Musculoskeletal: No lower extremity tenderness nor edema. No gross deformities of extremities. Neurologic:  Normal speech and language. No gross focal neurologic deficits are appreciated.  Skin:  Skin is warm, dry and intact. No rash noted.  ____________________________________________   LABS (all labs ordered are listed, but only abnormal results are displayed)  Labs Reviewed  COMPREHENSIVE METABOLIC PANEL -  Abnormal; Notable for the following components:      Result Value   Sodium 134 (*)    Glucose, Bld 109 (*)    BUN 24 (*)    Creatinine, Ser 1.73 (*)    GFR, Estimated 41 (*)    All other components within normal limits  CBC WITH DIFFERENTIAL/PLATELET - Abnormal; Notable for the following components:   WBC 2.4 (*)    RBC 3.61 (*)    Hemoglobin 11.1 (*)    HCT 33.8 (*)    Platelets 84 (*)    Lymphs Abs 0.2 (*)    Abs Immature Granulocytes 0.08 (*)    All other components within normal limits  PROTIME-INR - Abnormal; Notable for the following components:   Prothrombin Time 17.0 (*)    INR 1.4 (*)    All other components within normal limits  APTT - Abnormal; Notable for the following components:   aPTT 39 (*)    All other components within normal limits  URINALYSIS, ROUTINE W REFLEX MICROSCOPIC - Abnormal; Notable for the following components:   Hgb urine dipstick SMALL (*)    Protein, ur 30 (*)    All other components within normal limits  RESP PANEL BY RT-PCR (FLU A&B, COVID) ARPGX2  CULTURE, BLOOD (ROUTINE X 2)  CULTURE, BLOOD (ROUTINE X 2)  URINE CULTURE  LACTIC ACID, PLASMA   ____________________________________________  EKG  Reviewed. No acute ischemic change.   ____________________________________________  RADIOLOGY  CT Head Wo Contrast  Result Date: 09/08/2021 CLINICAL DATA:  Fever, weakness and diarrhea. EXAM: CT HEAD WITHOUT CONTRAST TECHNIQUE: Contiguous axial images were obtained from the base of the skull through the vertex without intravenous contrast. COMPARISON:  None. FINDINGS: Brain: There is mild cerebral atrophy with widening of  the extra-axial spaces and ventricular dilatation. There are areas of decreased attenuation within the white matter tracts of the supratentorial brain, consistent with microvascular disease changes. Small, bilateral chronic basal ganglia lacunar infarcts are noted. Vascular: No hyperdense vessel or unexpected calcification.  Skull: Normal. Negative for fracture or focal lesion. Sinuses/Orbits: No acute finding. Other: None. IMPRESSION: 1. No acute intracranial abnormality. 2. Generalized cerebral atrophy microvascular disease changes within the white matter tracts of the supratentorial brain. 3. Small, bilateral chronic basal ganglia lacunar infarcts. Electronically Signed   By: Virgina Norfolk M.D.   On: 09/08/2021 15:41   CT Chest Wo Contrast  Result Date: 09/08/2021 CLINICAL DATA:  Pneumonia, effusion or abscess suspected, xray done Fever and weakness.  Diarrhea. EXAM: CT CHEST WITHOUT CONTRAST TECHNIQUE: Multidetector CT imaging of the chest was performed following the standard protocol without IV contrast. COMPARISON:  Radiograph earlier today. Most recent chest CT 07/10/2021 FINDINGS: Cardiovascular: Prosthetic aortic valve. No aortic aneurysm or periaortic inflammation. Borderline heart size. No pericardial effusion. Mediastinum/Nodes: Patulous esophagus. Shotty mediastinal adenopathy with multiple prominent and mildly enlarged lymph nodes. These are not significantly changed from prior exam. Largest node is in the anterior paratracheal space measuring 16 mm short axis, previously 19 mm. The additional nodes are all 9-12 mm. Limited assessment for hilar adenopathy on this unenhanced exam. Prominent axillary nodes, including a 10 mm noted in the right axilla series 2, image 36, and increased number of nodes in the left axilla, largest measuring 9 mm short axis series 2, image 38. There is also mild left subpectoral adenopathy. Axillary and subpectoral nodes have increased in size from prior exam. There is supraclavicular adenopathy, detailed assessment limited in the absence of IV contrast. Lungs/Pleura: Linear opacities in the right lower lobe are similar in appearance to prior exam and favor scarring. This is adjacent to chain sutures. There is also mild subpleural scarring in the anterior right middle lobe and lingula.  No acute airspace disease to suggest pneumonia. No pleural effusion. No pulmonary nodule. There is central bronchial thickening. Upper Abdomen: Splenomegaly is partially included with spleen spanning 14.7 cm AP dimension. There is retroperitoneal adenopathy in the upper abdomen including a retrocaval node measuring 10 mm, series 2, image 158. Prominent lymph nodes in the gastrohepatic recess. Musculoskeletal: There are no acute or suspicious osseous abnormalities. IMPRESSION: 1. No evidence of pneumonia. Linear opacities in the right lower lobe adjacent to chain sutures are similar in appearance to prior exam and favor scarring. 2. Thoracic and upper abdominal adenopathy. Bilateral axillary, left subpectoral, and upper abdominal lymph nodes have increased in size from prior exam. The spleen is also enlarged. This is suspicious for disease progression in patient with history of CLL. 3. Central bronchial thickening, can be seen with bronchitis or reactive airways disease. Electronically Signed   By: Keith Rake M.D.   On: 09/08/2021 17:48   DG Chest Port 1 View  Result Date: 09/08/2021 CLINICAL DATA:  Fever, weakness and diarrhea. EXAM: PORTABLE CHEST 1 VIEW COMPARISON:  September 04, 2021 FINDINGS: Multiple sternal wires are present. The heart size and mediastinal contours are within normal limits. There is moderate severity calcification of the aortic arch. Both lungs are clear. The visualized skeletal structures are unremarkable. IMPRESSION: No active disease. Electronically Signed   By: Virgina Norfolk M.D.   On: 09/08/2021 15:34    ____________________________________________   PROCEDURES  Procedure(s) performed:   Procedures  None  ____________________________________________   INITIAL IMPRESSION / ASSESSMENT AND PLAN / ED  COURSE  Pertinent labs & imaging results that were available during my care of the patient were reviewed by me and considered in my medical decision making (see  chart for details).   Patient presents to the emergency department with fatigue, weakness, borderline fever.  He had an episode of diarrhea just prior to ED presentation.  No abdominal pain or tenderness on exam.  He is awake and alert with borderline low blood pressure.  He is afebrile here.  T-max at home is 100.0 F so temp is borderline. Has required multiple recent doses of neupogen. Plan for labs, cultures, UA, and CXR along with respiratory panel.  In review of his prior admission for this he had what was thought to be a viral process and responded symptomatically to Neupogen injections. No focal deficits to suspect CVA or bleed but with feeling off balance will obtain CT head.   No clear infection source. Afebrile here. Plan for discussion with oncology once additional viral testing and UA have resulted. Care transferred to Dr. Langston Masker.  ____________________________________________  FINAL CLINICAL IMPRESSION(S) / ED DIAGNOSES  Final diagnoses:  Other fatigue     MEDICATIONS GIVEN DURING THIS VISIT:  Medications  sodium chloride 0.9 % bolus 1,000 mL (1,000 mLs Intravenous New Bag/Given 09/08/21 1509)     Note:  This document was prepared using Dragon voice recognition software and may include unintentional dictation errors.  Nanda Quinton, MD, [facility] Emergency Medicine    Dany Harten, [facility], MD 09/09/21 [phone]

## 2021-09-08 NOTE — ED Triage Notes (Signed)
Pt arrives pov with c/o Fever and weakness with diarrhea starting today, decreased PO intake

## 2021-09-09 LAB — CULTURE, BLOOD (ROUTINE X 2)
Culture: NO GROWTH
Culture: NO GROWTH
Special Requests: ADEQUATE
Special Requests: ADEQUATE

## 2021-09-10 ENCOUNTER — Inpatient Hospital Stay: Payer: Medicare Other

## 2021-09-10 ENCOUNTER — Ambulatory Visit: Payer: Medicare Other | Admitting: *Deleted

## 2021-09-10 ENCOUNTER — Other Ambulatory Visit: Payer: Self-pay

## 2021-09-10 DIAGNOSIS — C9110 Chronic lymphocytic leukemia of B-cell type not having achieved remission: Secondary | ICD-10-CM

## 2021-09-10 DIAGNOSIS — D701 Agranulocytosis secondary to cancer chemotherapy: Secondary | ICD-10-CM | POA: Diagnosis not present

## 2021-09-10 DIAGNOSIS — I480 Paroxysmal atrial fibrillation: Secondary | ICD-10-CM

## 2021-09-10 DIAGNOSIS — I2699 Other pulmonary embolism without acute cor pulmonale: Secondary | ICD-10-CM | POA: Diagnosis not present

## 2021-09-10 DIAGNOSIS — Z5181 Encounter for therapeutic drug level monitoring: Secondary | ICD-10-CM

## 2021-09-10 DIAGNOSIS — Z952 Presence of prosthetic heart valve: Secondary | ICD-10-CM

## 2021-09-10 LAB — CBC WITH DIFFERENTIAL (CANCER CENTER ONLY)
Abs Immature Granulocytes: 0.01 10*3/uL (ref 0.00–0.07)
Basophils Absolute: 0 10*3/uL (ref 0.0–0.1)
Basophils Relative: 0 %
Eosinophils Absolute: 0 10*3/uL (ref 0.0–0.5)
Eosinophils Relative: 2 %
HCT: 30.9 % — ABNORMAL LOW (ref 39.0–52.0)
Hemoglobin: 10 g/dL — ABNORMAL LOW (ref 13.0–17.0)
Immature Granulocytes: 0 %
Lymphocytes Relative: 28 %
Lymphs Abs: 0.6 10*3/uL — ABNORMAL LOW (ref 0.7–4.0)
MCH: 30.6 pg (ref 26.0–34.0)
MCHC: 32.4 g/dL (ref 30.0–36.0)
MCV: 94.5 fL (ref 80.0–100.0)
Monocytes Absolute: 0.3 10*3/uL (ref 0.1–1.0)
Monocytes Relative: 15 %
Neutro Abs: 1.2 10*3/uL — ABNORMAL LOW (ref 1.7–7.7)
Neutrophils Relative %: 55 %
Platelet Count: 101 10*3/uL — ABNORMAL LOW (ref 150–400)
RBC: 3.27 MIL/uL — ABNORMAL LOW (ref 4.22–5.81)
RDW: 14.3 % (ref 11.5–15.5)
WBC Count: 2.3 10*3/uL — ABNORMAL LOW (ref 4.0–10.5)
nRBC: 0 % (ref 0.0–0.2)

## 2021-09-10 LAB — POCT INR: INR: 1.9 — AB (ref 2.0–3.0)

## 2021-09-10 LAB — URINE CULTURE: Culture: NO GROWTH

## 2021-09-10 NOTE — Patient Instructions (Signed)
Description   Take 1.5 tablets of warfarin today and then continue to take 1.5 tablets daily except for 1 tablet on Mondays. Recheck INR in 2 weeks. [facility] [phone].

## 2021-09-11 ENCOUNTER — Telehealth: Payer: Self-pay | Admitting: *Deleted

## 2021-09-11 ENCOUNTER — Inpatient Hospital Stay: Payer: No Typology Code available for payment source | Attending: Hematology & Oncology

## 2021-09-11 ENCOUNTER — Telehealth: Payer: Self-pay | Admitting: Hematology & Oncology

## 2021-09-11 VITALS — BP 151/86 | HR 72 | Temp 97.8°F | Resp 20

## 2021-09-11 DIAGNOSIS — D649 Anemia, unspecified: Secondary | ICD-10-CM | POA: Insufficient documentation

## 2021-09-11 DIAGNOSIS — C9110 Chronic lymphocytic leukemia of B-cell type not having achieved remission: Secondary | ICD-10-CM | POA: Insufficient documentation

## 2021-09-11 DIAGNOSIS — D701 Agranulocytosis secondary to cancer chemotherapy: Secondary | ICD-10-CM | POA: Diagnosis present

## 2021-09-11 DIAGNOSIS — I340 Nonrheumatic mitral (valve) insufficiency: Secondary | ICD-10-CM | POA: Diagnosis not present

## 2021-09-11 DIAGNOSIS — I2699 Other pulmonary embolism without acute cor pulmonale: Secondary | ICD-10-CM | POA: Diagnosis not present

## 2021-09-11 DIAGNOSIS — D709 Neutropenia, unspecified: Secondary | ICD-10-CM

## 2021-09-11 DIAGNOSIS — Z79899 Other long term (current) drug therapy: Secondary | ICD-10-CM | POA: Diagnosis not present

## 2021-09-11 DIAGNOSIS — Z7901 Long term (current) use of anticoagulants: Secondary | ICD-10-CM | POA: Insufficient documentation

## 2021-09-11 DIAGNOSIS — T451X5A Adverse effect of antineoplastic and immunosuppressive drugs, initial encounter: Secondary | ICD-10-CM | POA: Insufficient documentation

## 2021-09-11 DIAGNOSIS — R5081 Fever presenting with conditions classified elsewhere: Secondary | ICD-10-CM

## 2021-09-11 MED ORDER — PEGFILGRASTIM-BMEZ 6 MG/0.6ML ~~LOC~~ SOSY
6.0000 mg | PREFILLED_SYRINGE | Freq: Once | SUBCUTANEOUS | Status: AC
  Administered 2021-09-11: 6 mg via SUBCUTANEOUS
  Filled 2021-09-11: qty 0.6

## 2021-09-11 NOTE — Patient Instructions (Signed)

## 2021-09-11 NOTE — Telephone Encounter (Signed)
Received a call from patient asking about the plan of care going forward.  Dr [name] states that in light of patients recent bone marrow biopsy that looked good and his prolonged neutropenia, we will hold on treatment for now so he can recover.  Would like for patient to receive Neulasta shot though.  Patient scheduled today.  Patient in agreement with above

## 2021-09-13 ENCOUNTER — Other Ambulatory Visit: Payer: Self-pay | Admitting: Family Medicine

## 2021-09-13 LAB — CULTURE, BLOOD (ROUTINE X 2)
Culture: NO GROWTH
Culture: NO GROWTH
Special Requests: ADEQUATE
Special Requests: ADEQUATE

## 2021-09-15 ENCOUNTER — Other Ambulatory Visit: Payer: Self-pay | Admitting: Cardiovascular Disease

## 2021-09-20 ENCOUNTER — Encounter (HOSPITAL_BASED_OUTPATIENT_CLINIC_OR_DEPARTMENT_OTHER): Payer: Self-pay | Admitting: Emergency Medicine

## 2021-09-20 ENCOUNTER — Emergency Department (HOSPITAL_BASED_OUTPATIENT_CLINIC_OR_DEPARTMENT_OTHER): Payer: No Typology Code available for payment source

## 2021-09-20 ENCOUNTER — Other Ambulatory Visit: Payer: Self-pay

## 2021-09-20 ENCOUNTER — Emergency Department (HOSPITAL_BASED_OUTPATIENT_CLINIC_OR_DEPARTMENT_OTHER)
Admission: EM | Admit: 2021-09-20 | Discharge: 2021-09-21 | Disposition: A | Discharge status: Home / Self Care | Payer: No Typology Code available for payment source | Attending: Emergency Medicine | Admitting: Emergency Medicine

## 2021-09-20 ENCOUNTER — Encounter: Payer: Self-pay | Admitting: Hematology & Oncology

## 2021-09-20 DIAGNOSIS — I4891 Unspecified atrial fibrillation: Secondary | ICD-10-CM | POA: Diagnosis not present

## 2021-09-20 DIAGNOSIS — Z87891 Personal history of nicotine dependence: Secondary | ICD-10-CM | POA: Diagnosis not present

## 2021-09-20 DIAGNOSIS — N289 Disorder of kidney and ureter, unspecified: Secondary | ICD-10-CM | POA: Diagnosis not present

## 2021-09-20 DIAGNOSIS — Z7901 Long term (current) use of anticoagulants: Secondary | ICD-10-CM | POA: Insufficient documentation

## 2021-09-20 DIAGNOSIS — Z79899 Other long term (current) drug therapy: Secondary | ICD-10-CM | POA: Insufficient documentation

## 2021-09-20 DIAGNOSIS — N183 Chronic kidney disease, stage 3 unspecified: Secondary | ICD-10-CM | POA: Insufficient documentation

## 2021-09-20 DIAGNOSIS — Z20822 Contact with and (suspected) exposure to covid-19: Secondary | ICD-10-CM

## 2021-09-20 DIAGNOSIS — Z8546 Personal history of malignant neoplasm of prostate: Secondary | ICD-10-CM | POA: Insufficient documentation

## 2021-09-20 DIAGNOSIS — R509 Fever, unspecified: Secondary | ICD-10-CM | POA: Diagnosis present

## 2021-09-20 DIAGNOSIS — Z9229 Personal history of other drug therapy: Secondary | ICD-10-CM

## 2021-09-20 DIAGNOSIS — I129 Hypertensive chronic kidney disease with stage 1 through stage 4 chronic kidney disease, or unspecified chronic kidney disease: Secondary | ICD-10-CM | POA: Insufficient documentation

## 2021-09-20 LAB — CBC WITH DIFFERENTIAL/PLATELET
Abs Immature Granulocytes: 0.04 10*3/uL (ref 0.00–0.07)
Basophils Absolute: 0 10*3/uL (ref 0.0–0.1)
Basophils Relative: 0 %
Eosinophils Absolute: 0 10*3/uL (ref 0.0–0.5)
Eosinophils Relative: 0 %
HCT: 31.7 % — ABNORMAL LOW (ref 39.0–52.0)
Hemoglobin: 10.4 g/dL — ABNORMAL LOW (ref 13.0–17.0)
Immature Granulocytes: 1 %
Lymphocytes Relative: 7 %
Lymphs Abs: 0.4 10*3/uL — ABNORMAL LOW (ref 0.7–4.0)
MCH: 30.6 pg (ref 26.0–34.0)
MCHC: 32.8 g/dL (ref 30.0–36.0)
MCV: 93.2 fL (ref 80.0–100.0)
Monocytes Absolute: 0.5 10*3/uL (ref 0.1–1.0)
Monocytes Relative: 11 %
Neutro Abs: 3.8 10*3/uL (ref 1.7–7.7)
Neutrophils Relative %: 81 %
Platelets: 142 10*3/uL — ABNORMAL LOW (ref 150–400)
RBC: 3.4 MIL/uL — ABNORMAL LOW (ref 4.22–5.81)
RDW: 15.1 % (ref 11.5–15.5)
WBC: 4.7 10*3/uL (ref 4.0–10.5)
nRBC: 0 % (ref 0.0–0.2)

## 2021-09-20 LAB — COMPREHENSIVE METABOLIC PANEL
ALT: 13 U/L (ref 0–44)
AST: 15 U/L (ref 15–41)
Albumin: 4 g/dL (ref 3.5–5.0)
Alkaline Phosphatase: 116 U/L (ref 38–126)
Anion gap: 10 (ref 5–15)
BUN: 31 mg/dL — ABNORMAL HIGH (ref 8–23)
CO2: 24 mmol/L (ref 22–32)
Calcium: 9.3 mg/dL (ref 8.9–10.3)
Chloride: 102 mmol/L (ref 98–111)
Creatinine, Ser: 1.86 mg/dL — ABNORMAL HIGH (ref 0.61–1.24)
GFR, Estimated: 37 mL/min — ABNORMAL LOW (ref >60)
Glucose, Bld: 109 mg/dL — ABNORMAL HIGH (ref 70–99)
Potassium: 4.4 mmol/L (ref 3.5–5.1)
Sodium: 136 mmol/L (ref 135–145)
Total Bilirubin: 0.5 mg/dL (ref 0.3–1.2)
Total Protein: 6.3 g/dL — ABNORMAL LOW (ref 6.5–8.1)

## 2021-09-20 LAB — RESP PANEL BY RT-PCR (FLU A&B, COVID) ARPGX2
Influenza A by PCR: NEGATIVE
Influenza B by PCR: NEGATIVE
SARS Coronavirus 2 by RT PCR: NEGATIVE

## 2021-09-20 LAB — LACTIC ACID, PLASMA: Lactic Acid, Venous: 0.7 mmol/L (ref 0.5–1.9)

## 2021-09-20 NOTE — ED Triage Notes (Signed)
Patient reports to the ER for fever. Patient reports he is being treated for CLL and got his COVID booster shot today and has developed a fever of 101 and 100 respectively throughout his day. Patient denies feeling bad or having other complaints.

## 2021-09-20 NOTE — ED Notes (Signed)
Pt states he took a couple of Tylenol around 5:30-6:00.

## 2021-09-20 NOTE — ED Provider Notes (Signed)
[facility] EMERGENCY DEPT Provider Note   CSN: [id number] Arrival date & time: 09/20/21  1812     History Chief Complaint  Patient presents with   Fever    [name] is a 75 y.o. male.  The history is provided by the patient.  Fever Max temp prior to arrival:  101 Temp source:  Oral Severity:  Mild Onset quality:  Gradual Duration: hours post covid vaccination, no other symptoms. Timing:  Rare Progression:  Resolved Chronicity:  New Relieved by:  Nothing Worsened by:  Nothing Ineffective treatments:  None tried Associated symptoms: no chest pain, no chills, no confusion, no congestion, no cough, no diarrhea, no dysuria, no ear pain, no headaches, no myalgias, no nausea, no rash, no rhinorrhea, no somnolence, no sore throat and no vomiting   Risk factors: no contaminated food   Risk factors comment:  Covid vaccination hours prior to fever Had a covid vaccine and then developed a fever.  No other symptoms.  Was told to come in.  The fever is gone and the patient is completely asymptomatic.      Past Medical History:  Diagnosis Date   Anticoagulants causing adverse effect in therapeutic use    Anxiety    Paxil seemed to cause odd neuro side effects; better on prozac as of 09/2015   Aortic regurgitation 04/2007 surgery   Resolved [facility]R at [facility]   Cervical spondylosis    ESI by [name] in [place]. 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 (most recent 07/2017)   History of prostate cancer 2003   Rad prost   History of pulmonary embolism 2011; 10/2014   Recurrence off of anticoagulation 10/2014: per hem/onc ([name]) pt needs lifelong anticoagulation (hypercoag w/u neg).   Hyperlipidemia    statin-intolerant, except for crestor low dose.   Hypertension    Migraine syndrome    Mitral regurgitation    PAF (paroxysmal atrial fibrillation) (HCC)    Panic  attacks    "           "             "                  "                  "                       "                             "                          Pulmonary nodule, right 2015   RLL: resected at [facility] --VATS; Benign RLL nodule (fungal and AFB stains neg).  Plan is for [facility] thoracic surg to do f/u o/v & CT chest 1 yr    Right-sided headache 07/2016   Primary stabbing HA or migraine per neuro (Dr. Tomi Likens).  Gabapentin and topamax not tolerated.  These resolved spontaneously.    Patient Active Problem List   Diagnosis Date Noted   Febrile neutropenia ([place]) 08/19/2021   Aortic atherosclerosis ([place]) 07/25/2021   Pneumonia of right lower lobe due to infectious organism    Neutropenic fever (  [place])    FUO (fever of unknown origin)    Pancytopenia ([place])    Positive blood culture 07/09/2021   Anticoagulants causing adverse effect in therapeutic use 06/22/2021   Encounter for therapeutic drug monitoring 09/21/2019   PTSD (post-traumatic stress disorder) 09/29/2018   BPPV (benign paroxysmal positional vertigo) 09/04/2017   CKD (chronic kidney disease), stage III ([place]) 08/11/2017   Former smoker 08/29/2016   Cervical spondylosis    S/P Bioprosthetic AVR (aortic valve replacement) in 2011 03/22/2015   History of pulmonary embolism 03/07/2015   Recurrent pulmonary embolism ([place]) 11/09/2014   Lung nodule 11/02/2014   Chronic lymphocytic leukemia ([place]) 11/11/2013   Migraine 10/08/2010   GERD 12/11/2009   MITRAL REGURGITATION 04/07/2009   Insomnia 02/03/2008   Anxiety state 09/06/2007   HLD (hyperlipidemia) 04/20/2007   Essential hypertension 04/20/2007   Atrial fibrillation ([place]) 04/20/2007   PROSTATE CANCER, HX OF 04/20/2007    Past Surgical History:  Procedure Laterality Date   AORTIC VALVE REPLACEMENT  2011   [facility]  (bioprosthetic)   CARDIAC CATHETERIZATION  04/2010   Normal coronaries.   Carotid dopplers  08/2015   NORMAL   CATARACT EXTRACTION     L 12/06/09   R 09/14/09    COLONOSCOPY  01/26/13   tic's, o/w normal.  ([place])--recall 10 yrs.   PENILE PROSTHESIS IMPLANT     PROSTATECTOMY  04/2002   for prostate cancer   Pulmonary nodule resection  Fall/winter 2016   [facility]   SHOULDER SURGERY     rotator cuff on both arms    TEE WITHOUT CARDIOVERSION N/A 07/13/2021   Procedure: TRANSESOPHAGEAL ECHOCARDIOGRAM (TEE);  Surgeon: Pixie Casino, MD;  Location: [facility] ENDOSCOPY;  Service: Cardiovascular;  Laterality: N/A;   TIBIALIS TENDON TRANSFER / REPAIR Right 03/02/2019   Procedure: RECONSTRUCTION OF RIGHT  ANTERIOR TIBIALIS TENDON;  Surgeon: Erle Crocker, MD;  Location: [facility];  Service: Orthopedics;  Laterality: Right;   TRANSTHORACIC ECHOCARDIOGRAM  09/20/2014; 03/2016; 03/2017   2015-mild LVH, EF 50-55%, wall motion nl, grade I diast dysfxn, prosth aort valve good,transaortic gradients decreased compared to echo 11/2013.   03/2016: EF 55-60%, normal LV function, severe LVH, normal diast fxn, mild increase in AV gradient compared to 09/2014 echo.  2018: EF 55-60%, normal LV fxn, grd II DD, AV stable/gradient's stable.       Family History  Problem Relation Age of Onset   Cancer Sister        uterine   Colon cancer Sister 41   Stroke Mother    Prostate cancer Father    Stroke Sister    Stroke Brother    Heart attack Neg Hx     Social History   Tobacco Use   Smoking status: Former    Packs/day: 1.00    Years: 12.00    Pack years: 12.00    Types: Cigarettes    Start date: 11/28/1968    Quit date: 01/29/1981    Years since quitting: 40.6   Smokeless tobacco: Never   Tobacco comments:    quit 75 yo   Vaping Use   Vaping Use: Never used  Substance Use Topics   Alcohol use: Not Currently    Comment: occasional   Drug use: No    Home Medications Prior to Admission medications   Medication Sig Start Date End Date Taking? Authorizing Provider  Cholecalciferol (VITAMIN D3) 50 MCG (2000 UT) capsule Take 2,000 Units by mouth every  morning.    [provider]  [employer] MOTION SICKNESS RELIEF 25 MG CHEW CHEW 1 TABLET EVERY DAY AS NEEDED FOR DIZZINESS 09/13/21   [name], MD  docusate sodium (COLACE) 100 MG capsule Take 100 mg by mouth daily as needed (constipation).    [provider]  hydrochlorothiazide (HYDRODIURIL) 12.5 MG tablet TAKE 1 TABLET BY MOUTH EVERY DAY Patient taking differently: Take 12.5 mg by mouth every morning. 04/04/21   [name], MD  ipratropium (ATROVENT) 0.03 % nasal spray Place 2 sprays into both nostrils every 12 (twelve) hours. Patient taking differently: Place 2 sprays into both nostrils 2 (two) times daily as needed for rhinitis (congestion). 10/04/20   Inda Coke, PA  LORazepam (ATIVAN) 0.5 MG tablet Take 1 tablet (0.5 mg total) by mouth at bedtime as needed for anxiety (can take 1/4 or 1/2 tablet  if effective.). Patient taking differently: Take 0.125-0.5 mg by mouth at bedtime as needed for anxiety. 08/02/21   [name], MD  omeprazole (PRILOSEC) 40 MG capsule Take 40 mg by mouth every evening. 10/18/20   [provider]  rosuvastatin (CRESTOR) 10 MG tablet TAKE 1 TABLET BY MOUTH EVERY DAY Patient taking differently: Take 10 mg by mouth at bedtime. 11/01/20   [name], MD  sertraline (ZOLOFT) 100 MG tablet Take 100 mg by mouth every morning.    [provider]  sodium chloride (OCEAN) 0.65 % SOLN nasal spray Place 1 spray into both nostrils as needed for congestion.    [provider]  venetoclax (VENCLEXTA) 100 MG tablet Take 1 tablet (100 mg total) by mouth daily. Tablets should be swallowed whole with a meal and a full glass of water. Patient not taking: No sig reported 07/05/21   Volanda Napoleon, MD  warfarin (COUMADIN) 5 MG tablet TAKE AS DIRECTED BY COUMADIN CLINIC 09/17/21   Josue Hector, MD    Allergies    Hydrocodone-acetaminophen, Oxycodone hcl, Telmisartan-hctz, Ramipril, Aspirin, Atorvastatin, Buspirone  hcl, Codeine, Morphine sulfate, Paroxetine, and Rabeprazole  Review of Systems   Review of Systems  Constitutional:  Positive for fever. Negative for chills and fatigue.  HENT:  Negative for congestion, ear pain, rhinorrhea and sore throat.   Eyes:  Negative for photophobia and redness.  Respiratory:  Negative for cough.   Cardiovascular:  Negative for chest pain.  Gastrointestinal:  Negative for diarrhea, nausea and vomiting.  Genitourinary:  Negative for dysuria.  Musculoskeletal:  Negative for myalgias, neck pain and neck stiffness.  Skin:  Negative for rash.  Neurological:  Negative for weakness and headaches.  Psychiatric/Behavioral:  Negative for confusion.   All other systems reviewed and are negative.  Physical Exam Updated Vital Signs BP (!) 142/70 (BP Location: Right Arm)   Pulse 98   Temp 98.5 F (36.9 C) (Oral)   Resp 16   SpO2 98%   Physical Exam Vitals and nursing note reviewed.  Constitutional:      General: He is not in acute distress.    Appearance: Normal appearance.  HENT:     Head: Normocephalic and atraumatic.     Nose: Nose normal.  Eyes:     Conjunctiva/sclera: Conjunctivae normal.     Pupils: Pupils are equal, round, and reactive to light.  Cardiovascular:     Rate and Rhythm: Normal rate and regular rhythm.     Pulses: Normal pulses.     Heart sounds: Normal heart sounds.  Pulmonary:     Effort: Pulmonary effort is normal.  Breath sounds: Normal breath sounds.  Abdominal:     General: Abdomen is flat. Bowel sounds are normal.     Palpations: Abdomen is soft.     Tenderness: There is no abdominal tenderness. There is no guarding.  Musculoskeletal:        General: Normal range of motion.     Cervical back: Normal range of motion and neck supple. No rigidity.  Skin:    General: Skin is warm and dry.     Capillary Refill: Capillary refill takes less than 2 seconds.  Neurological:     General: No focal deficit present.     Mental Status:  He is alert and oriented to person, place, and time.     Deep Tendon Reflexes: Reflexes normal.  Psychiatric:        Mood and Affect: Mood normal.        Behavior: Behavior normal.    ED Results / Procedures / Treatments   Labs (all labs ordered are listed, but only abnormal results are displayed) Results for orders placed or performed during the hospital encounter of 09/20/21  Resp Panel by RT-PCR (Flu A&B, Covid) Nasopharyngeal Swab   Specimen: Nasopharyngeal Swab; Nasopharyngeal(NP) swabs in vial transport medium  Result Value Ref Range   SARS Coronavirus 2 by RT PCR NEGATIVE NEGATIVE   Influenza A by PCR NEGATIVE NEGATIVE   Influenza B by PCR NEGATIVE NEGATIVE  Lactic acid, plasma  Result Value Ref Range   Lactic Acid, Venous 0.7 0.5 - 1.9 mmol/L  Comprehensive metabolic panel  Result Value Ref Range   Sodium 136 135 - 145 mmol/L   Potassium 4.4 3.5 - 5.1 mmol/L   Chloride 102 98 - 111 mmol/L   CO2 24 22 - 32 mmol/L   Glucose, Bld 109 (H) 70 - 99 mg/dL   BUN 31 (H) 8 - 23 mg/dL   Creatinine, Ser 1.86 (H) 0.61 - 1.24 mg/dL   Calcium 9.3 8.9 - 10.3 mg/dL   Total Protein 6.3 (L) 6.5 - 8.1 g/dL   Albumin 4.0 3.5 - 5.0 g/dL   AST 15 15 - 41 U/L   ALT 13 0 - 44 U/L   Alkaline Phosphatase 116 38 - 126 U/L   Total Bilirubin 0.5 0.3 - 1.2 mg/dL   GFR, Estimated 37 (L) >60 mL/min   Anion gap 10 5 - 15  CBC with Differential  Result Value Ref Range   WBC 4.7 4.0 - 10.5 K/uL   RBC 3.40 (L) 4.22 - 5.81 MIL/uL   Hemoglobin 10.4 (L) 13.0 - 17.0 g/dL   HCT 31.7 (L) 39.0 - 52.0 %   MCV 93.2 80.0 - 100.0 fL   MCH 30.6 26.0 - 34.0 pg   MCHC 32.8 30.0 - 36.0 g/dL   RDW 15.1 11.5 - 15.5 %   Platelets 142 (L) 150 - 400 K/uL   nRBC 0.0 0.0 - 0.2 %   Neutrophils Relative % 81 %   Neutro Abs 3.8 1.7 - 7.7 K/uL   Lymphocytes Relative 7 %   Lymphs Abs 0.4 (L) 0.7 - 4.0 K/uL   Monocytes Relative 11 %   Monocytes Absolute 0.5 0.1 - 1.0 K/uL   Eosinophils Relative 0 %   Eosinophils  Absolute 0.0 0.0 - 0.5 K/uL   Basophils Relative 0 %   Basophils Absolute 0.0 0.0 - 0.1 K/uL   Immature Granulocytes 1 %   Abs Immature Granulocytes 0.04 0.00 - 0.07 K/uL   *Note: Due to a  large number of results and/or encounters for the requested time period, some results have not been displayed. A complete set of results can be found in Results Review.   DG Chest 2 View  Result Date: 09/04/2021 CLINICAL DATA:  Sepsis EXAM: CHEST - 2 VIEW COMPARISON:  CT examination dated October 18, 2020 FINDINGS: The heart size and mediastinal contours are within normal limits. Atherosclerotic calcifications of the aortic arch. Prosthetic aortic valve. Both lungs are clear. The visualized skeletal structures are unremarkable. Sternotomy wires are intact. IMPRESSION: No active cardiopulmonary disease. Electronically Signed   By: Keane Police D.O.   On: 09/04/2021 16:52   CT Head Wo Contrast  Result Date: 09/08/2021 CLINICAL DATA:  Fever, weakness and diarrhea. EXAM: CT HEAD WITHOUT CONTRAST TECHNIQUE: Contiguous axial images were obtained from the base of the skull through the vertex without intravenous contrast. COMPARISON:  None. FINDINGS: Brain: There is mild cerebral atrophy with widening of the extra-axial spaces and ventricular dilatation. There are areas of decreased attenuation within the white matter tracts of the supratentorial brain, consistent with microvascular disease changes. Small, bilateral chronic basal ganglia lacunar infarcts are noted. Vascular: No hyperdense vessel or unexpected calcification. Skull: Normal. Negative for fracture or focal lesion. Sinuses/Orbits: No acute finding. Other: None. IMPRESSION: 1. No acute intracranial abnormality. 2. Generalized cerebral atrophy microvascular disease changes within the white matter tracts of the supratentorial brain. 3. Small, bilateral chronic basal ganglia lacunar infarcts. Electronically Signed   By: Virgina Norfolk M.D.   On: 09/08/2021 15:41    CT Chest Wo Contrast  Result Date: 09/08/2021 CLINICAL DATA:  Pneumonia, effusion or abscess suspected, xray done Fever and weakness.  Diarrhea. EXAM: CT CHEST WITHOUT CONTRAST TECHNIQUE: Multidetector CT imaging of the chest was performed following the standard protocol without IV contrast. COMPARISON:  Radiograph earlier today. Most recent chest CT 07/10/2021 FINDINGS: Cardiovascular: Prosthetic aortic valve. No aortic aneurysm or periaortic inflammation. Borderline heart size. No pericardial effusion. Mediastinum/Nodes: Patulous esophagus. Shotty mediastinal adenopathy with multiple prominent and mildly enlarged lymph nodes. These are not significantly changed from prior exam. Largest node is in the anterior paratracheal space measuring 16 mm short axis, previously 19 mm. The additional nodes are all 9-12 mm. Limited assessment for hilar adenopathy on this unenhanced exam. Prominent axillary nodes, including a 10 mm noted in the right axilla series 2, image 36, and increased number of nodes in the left axilla, largest measuring 9 mm short axis series 2, image 38. There is also mild left subpectoral adenopathy. Axillary and subpectoral nodes have increased in size from prior exam. There is supraclavicular adenopathy, detailed assessment limited in the absence of IV contrast. Lungs/Pleura: Linear opacities in the right lower lobe are similar in appearance to prior exam and favor scarring. This is adjacent to chain sutures. There is also mild subpleural scarring in the anterior right middle lobe and lingula. No acute airspace disease to suggest pneumonia. No pleural effusion. No pulmonary nodule. There is central bronchial thickening. Upper Abdomen: Splenomegaly is partially included with spleen spanning 14.7 cm AP dimension. There is retroperitoneal adenopathy in the upper abdomen including a retrocaval node measuring 10 mm, series 2, image 158. Prominent lymph nodes in the gastrohepatic recess.  Musculoskeletal: There are no acute or suspicious osseous abnormalities. IMPRESSION: 1. No evidence of pneumonia. Linear opacities in the right lower lobe adjacent to chain sutures are similar in appearance to prior exam and favor scarring. 2. Thoracic and upper abdominal adenopathy. Bilateral axillary, left subpectoral, and upper abdominal  lymph nodes have increased in size from prior exam. The spleen is also enlarged. This is suspicious for disease progression in patient with history of CLL. 3. Central bronchial thickening, can be seen with bronchitis or reactive airways disease. Electronically Signed   By: Keith Rake M.D.   On: 09/08/2021 17:48   DG Chest Portable 1 View  Result Date: 09/20/2021 CLINICAL DATA:  Fever EXAM: PORTABLE CHEST 1 VIEW COMPARISON:  09/08/2021 FINDINGS: Post sternotomy changes and valve prosthesis. Linear scarring and postsurgical change in the right hilar and infrahilar lung. No acute airspace disease or effusion. Stable cardiomediastinal silhouette with aortic atherosclerosis. Punctate metallic densities over the right shoulder are redemonstrated IMPRESSION: No active disease.  Postsurgical changes on the right with scarring Electronically Signed   By: Donavan Foil M.D.   On: 09/20/2021 23:23   DG Chest Port 1 View  Result Date: 09/08/2021 CLINICAL DATA:  Fever, weakness and diarrhea. EXAM: PORTABLE CHEST 1 VIEW COMPARISON:  September 04, 2021 FINDINGS: Multiple sternal wires are present. The heart size and mediastinal contours are within normal limits. There is moderate severity calcification of the aortic arch. Both lungs are clear. The visualized skeletal structures are unremarkable. IMPRESSION: No active disease. Electronically Signed   By: Virgina Norfolk M.D.   On: 09/08/2021 15:34     Radiology DG Chest Portable 1 View  Result Date: 09/20/2021 CLINICAL DATA:  Fever EXAM: PORTABLE CHEST 1 VIEW COMPARISON:  09/08/2021 FINDINGS: Post sternotomy changes and  valve prosthesis. Linear scarring and postsurgical change in the right hilar and infrahilar lung. No acute airspace disease or effusion. Stable cardiomediastinal silhouette with aortic atherosclerosis. Punctate metallic densities over the right shoulder are redemonstrated IMPRESSION: No active disease.  Postsurgical changes on the right with scarring Electronically Signed   By: Donavan Foil M.D.   On: 09/20/2021 23:23    Procedures Procedures   Medications Ordered in ED Medications - No data to display  ED Course  I have reviewed the triage vital signs and the nursing notes.  Pertinent labs & imaging results that were available during my care of the patient were reviewed by me and considered in my medical decision making (see chart for details). Labs and imaging negative.  COVID swab is pending.  He does not want to wait and will check mychart.  He has normal exam and normal vitals and is stable for discharge with close follow up.   [name] was evaluated in Emergency Department on 09/20/2021 for the symptoms described in the history of present illness. He was evaluated in the context of the global COVID-19 pandemic, which necessitated consideration that the patient might be at risk for infection with the SARS-CoV-2 virus that causes COVID-19. Institutional protocols and algorithms that pertain to the evaluation of patients at risk for COVID-19 are in a state of rapid change based on information released by regulatory bodies including the CDC and federal and state organizations. These policies and algorithms were followed during the patient's care in the ED.  Final Clinical Impression(s) / ED Diagnoses Final diagnoses:  Status post administration of all doses of COVID-19 vaccine series  Renal insufficiency   Return for intractable cough, coughing up blood, fevers > 100.4 unrelieved by medication, shortness of breath, intractable vomiting, chest pain, shortness of breath, weakness,  numbness, changes in speech, facial asymmetry, abdominal pain, passing out, Inability to tolerate liquids or food, cough, altered mental status or any concerns. No signs of systemic illness or infection. The patient is  nontoxic-appearing on exam and vital signs are within normal limits.  I have reviewed the triage vital signs and the nursing notes. Pertinent labs & imaging results that were available during my care of the patient were reviewed by me and considered in my medical decision making (see chart for details). After history, exam, and medical workup I feel the patient has been appropriately medically screened and is safe for discharge home. Pertinent diagnoses were discussed with the patient. Patient was given return precautions.      Rx / DC Orders ED Discharge Orders     None        Oluwanifemi Susman, MD 09/20/21 2358

## 2021-09-21 ENCOUNTER — Encounter: Payer: Self-pay | Admitting: Family Medicine

## 2021-09-21 ENCOUNTER — Encounter: Payer: Self-pay | Admitting: Hematology & Oncology

## 2021-09-22 ENCOUNTER — Emergency Department (HOSPITAL_BASED_OUTPATIENT_CLINIC_OR_DEPARTMENT_OTHER)
Admission: EM | Admit: 2021-09-22 | Discharge: 2021-09-22 | Disposition: A | Discharge status: Home / Self Care | Payer: No Typology Code available for payment source | Attending: Emergency Medicine | Admitting: Emergency Medicine

## 2021-09-22 ENCOUNTER — Other Ambulatory Visit: Payer: Self-pay

## 2021-09-22 ENCOUNTER — Encounter (HOSPITAL_BASED_OUTPATIENT_CLINIC_OR_DEPARTMENT_OTHER): Payer: Self-pay | Admitting: Emergency Medicine

## 2021-09-22 DIAGNOSIS — Z79899 Other long term (current) drug therapy: Secondary | ICD-10-CM | POA: Insufficient documentation

## 2021-09-22 DIAGNOSIS — I4891 Unspecified atrial fibrillation: Secondary | ICD-10-CM | POA: Diagnosis not present

## 2021-09-22 DIAGNOSIS — R509 Fever, unspecified: Secondary | ICD-10-CM | POA: Diagnosis present

## 2021-09-22 DIAGNOSIS — Z87891 Personal history of nicotine dependence: Secondary | ICD-10-CM | POA: Diagnosis not present

## 2021-09-22 DIAGNOSIS — N183 Chronic kidney disease, stage 3 unspecified: Secondary | ICD-10-CM | POA: Diagnosis not present

## 2021-09-22 DIAGNOSIS — I129 Hypertensive chronic kidney disease with stage 1 through stage 4 chronic kidney disease, or unspecified chronic kidney disease: Secondary | ICD-10-CM | POA: Insufficient documentation

## 2021-09-22 DIAGNOSIS — Z8546 Personal history of malignant neoplasm of prostate: Secondary | ICD-10-CM | POA: Diagnosis not present

## 2021-09-22 LAB — BASIC METABOLIC PANEL
Anion gap: 9 (ref 5–15)
BUN: 30 mg/dL — ABNORMAL HIGH (ref 8–23)
CO2: 25 mmol/L (ref 22–32)
Calcium: 9.7 mg/dL (ref 8.9–10.3)
Chloride: 101 mmol/L (ref 98–111)
Creatinine, Ser: 1.5 mg/dL — ABNORMAL HIGH (ref 0.61–1.24)
GFR, Estimated: 48 mL/min — ABNORMAL LOW (ref >60)
Glucose, Bld: 104 mg/dL — ABNORMAL HIGH (ref 70–99)
Potassium: 4.2 mmol/L (ref 3.5–5.1)
Sodium: 135 mmol/L (ref 135–145)

## 2021-09-22 LAB — CBC
HCT: 33.1 % — ABNORMAL LOW (ref 39.0–52.0)
Hemoglobin: 10.7 g/dL — ABNORMAL LOW (ref 13.0–17.0)
MCH: 30.1 pg (ref 26.0–34.0)
MCHC: 32.3 g/dL (ref 30.0–36.0)
MCV: 93.2 fL (ref 80.0–100.0)
Platelets: 92 10*3/uL — ABNORMAL LOW (ref 150–400)
RBC: 3.55 MIL/uL — ABNORMAL LOW (ref 4.22–5.81)
RDW: 15.1 % (ref 11.5–15.5)
WBC: 2.7 10*3/uL — ABNORMAL LOW (ref 4.0–10.5)
nRBC: 0 % (ref 0.0–0.2)

## 2021-09-22 LAB — LACTIC ACID, PLASMA
Lactic Acid, Venous: 0.7 mmol/L (ref 0.5–1.9)
Lactic Acid, Venous: 1.2 mmol/L (ref 0.5–1.9)

## 2021-09-22 MED ORDER — SODIUM CHLORIDE 0.9 % IV BOLUS
1000.0000 mL | Freq: Once | INTRAVENOUS | Status: AC
  Administered 2021-09-22: 1000 mL via INTRAVENOUS

## 2021-09-22 NOTE — Discharge Instructions (Addendum)
Follow up with your Physician for recheck.  Drink plenty of fluids.  Return if any problems.

## 2021-09-22 NOTE — ED Triage Notes (Signed)
Pt presents to ED Pov. Pt c/o fever. Pt reports that he is on chemo was seen 2d ago for same. Pt reports that his temp was 100 PTA. Tylenol ~0800 PTA. Pt c/o weakness.

## 2021-09-23 ENCOUNTER — Emergency Department (HOSPITAL_BASED_OUTPATIENT_CLINIC_OR_DEPARTMENT_OTHER)
Admission: EM | Admit: 2021-09-23 | Discharge: 2021-09-23 | Discharge status: Against Medical Advice | Payer: No Typology Code available for payment source

## 2021-09-23 ENCOUNTER — Encounter: Payer: Self-pay | Admitting: Hematology & Oncology

## 2021-09-23 NOTE — ED Provider Notes (Signed)
[name] Provider Note   CSN: [id number] Arrival date & time: 09/22/21  1047     History Chief Complaint  Patient presents with   Fever    [name] is a 75 y.o. male.  The history is provided by the patient. No language interpreter was used.  Fever Max temp prior to arrival:  100.4 Temp source:  Subjective Severity:  Moderate Onset quality:  Gradual Duration:  2 days Timing:  Constant Progression:  Worsening Chronicity:  New Relieved by:  None tried Worsened by:  Nothing Ineffective treatments:  None tried Associated symptoms: no chills, no cough, no nausea and no sore throat   Risk factors: no contaminated food and no sick contacts   Pt had covid vaccine 2 days ago. Pt has a history of neutropenia.  His MD has advised him to get checked when he has a fever.  Pt denies any current symptoms      Past Medical History:  Diagnosis Date   Anticoagulants causing adverse effect in therapeutic use    Anxiety    Paxil seemed to cause odd neuro side effects; better on prozac as of 09/2015   Aortic regurgitation 04/2007 surgery   Resolved with [facility] AVR at [facility]   Cervical spondylosis    ESI by [name] in [place]. 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 (most recent 07/2017)   History of prostate cancer 2003   Rad prost   History of pulmonary embolism 2011; 10/2014   Recurrence off of anticoagulation 10/2014: per hem/onc ([name]) pt needs lifelong anticoagulation (hypercoag w/u neg).   Hyperlipidemia    statin-intolerant, except for crestor low dose.   Hypertension    Migraine syndrome    Mitral regurgitation    PAF (paroxysmal atrial fibrillation) (HCC)    Panic attacks    "           "             "                  "                  "                       "                             "                          Pulmonary nodule, right  2015   RLL: resected at [facility]; Benign RLL nodule (fungal and AFB stains neg).  Plan is for [facility] thoracic surg to do f/u o/v & CT chest 1 yr    Right-sided headache 07/2016   Primary stabbing HA or migraine per neuro (Dr. Tomi Likens).  Gabapentin and topamax not tolerated.  These resolved spontaneously.    Patient Active Problem List   Diagnosis Date Noted   Febrile neutropenia ([place]) 08/19/2021   Aortic atherosclerosis ([place]) 07/25/2021   Pneumonia of right lower lobe due to infectious organism    Neutropenic fever (HCC)    FUO (fever of unknown origin)    Pancytopenia ([place])    Positive blood culture 07/09/2021   Anticoagulants causing adverse effect  in therapeutic use 06/22/2021   Encounter for therapeutic drug monitoring 09/21/2019   PTSD (post-traumatic stress disorder) 09/29/2018   BPPV (benign paroxysmal positional vertigo) 09/04/2017   CKD (chronic kidney disease), stage III ([place]) 08/11/2017   Former smoker 08/29/2016   Cervical spondylosis    S/P Bioprosthetic AVR (aortic valve replacement) in 2011 03/22/2015   History of pulmonary embolism 03/07/2015   Recurrent pulmonary embolism ([place]) 11/09/2014   Lung nodule 11/02/2014   Chronic lymphocytic leukemia ([place]) 11/11/2013   Migraine 10/08/2010   GERD 12/11/2009   MITRAL REGURGITATION 04/07/2009   Insomnia 02/03/2008   Anxiety state 09/06/2007   HLD (hyperlipidemia) 04/20/2007   Essential hypertension 04/20/2007   Atrial fibrillation ([place]) 04/20/2007   PROSTATE CANCER, HX OF 04/20/2007    Past Surgical History:  Procedure Laterality Date   AORTIC VALVE REPLACEMENT  2011   [place]  (bioprosthetic)   CARDIAC CATHETERIZATION  04/2010   Normal coronaries.   Carotid dopplers  08/2015   NORMAL   CATARACT EXTRACTION     L 12/06/09   R 09/14/09   COLONOSCOPY  01/26/13   tic's, o/w normal.  ([place])--recall 10 yrs.   PENILE PROSTHESIS IMPLANT     PROSTATECTOMY  04/2002   for prostate cancer   Pulmonary nodule resection   Fall/winter 2016   [facility]   SHOULDER SURGERY     rotator cuff on both arms    TEE WITHOUT CARDIOVERSION N/A 07/13/2021   Procedure: TRANSESOPHAGEAL ECHOCARDIOGRAM (TEE);  Surgeon: Pixie Casino, MD;  [facility] ENDOSCOPY;  Service: Cardiovascular;  Laterality: N/A;   TIBIALIS TENDON TRANSFER / REPAIR Right 03/02/2019   Procedure: RECONSTRUCTION OF RIGHT  ANTERIOR TIBIALIS TENDON;  Surgeon: Erle Crocker, MD;  Location: [place];  Service: Orthopedics;  Laterality: Right;   TRANSTHORACIC ECHOCARDIOGRAM  09/20/2014; 03/2016; 03/2017   2015-mild LVH, EF 50-55%, wall motion nl, grade I diast dysfxn, prosth aort valve good,transaortic gradients decreased compared to echo 11/2013.   03/2016: EF 55-60%, normal LV function, severe LVH, normal diast fxn, mild increase in AV gradient compared to 09/2014 echo.  2018: EF 55-60%, normal LV fxn, grd II DD, AV stable/gradient's stable.       Family History  Problem Relation Age of Onset   Cancer Sister        uterine   Colon cancer Sister 37   Stroke Mother    Prostate cancer Father    Stroke Sister    Stroke Brother    Heart attack Neg Hx     Social History   Tobacco Use   Smoking status: Former    Packs/day: 1.00    Years: 12.00    Pack years: 12.00    Types: Cigarettes    Start date: 11/28/1968    Quit date: 01/29/1981    Years since quitting: 40.6   Smokeless tobacco: Never   Tobacco comments:    quit 75 yo   Vaping Use   Vaping Use: Never used  Substance Use Topics   Alcohol use: Not Currently    Comment: occasional   Drug use: No    Home Medications Prior to Admission medications   Medication Sig Start Date End Date Taking? Authorizing Provider  Cholecalciferol (VITAMIN D3) 50 MCG (2000 UT) capsule Take 2,000 Units by mouth every morning.    [provider]  [employer] MOTION SICKNESS RELIEF 25 MG CHEW CHEW 1 TABLET EVERY DAY AS NEEDED FOR DIZZINESS 09/13/21   [name], MD  docusate  sodium  (COLACE) 100 MG capsule Take 100 mg by mouth daily as needed (constipation).    [provider]  hydrochlorothiazide (HYDRODIURIL) 12.5 MG tablet TAKE 1 TABLET BY MOUTH EVERY DAY Patient taking differently: Take 12.5 mg by mouth every morning. 04/04/21   [name], MD  ipratropium (ATROVENT) 0.03 % nasal spray Place 2 sprays into both nostrils every 12 (twelve) hours. Patient taking differently: Place 2 sprays into both nostrils 2 (two) times daily as needed for rhinitis (congestion). 10/04/20   Inda Coke, PA  LORazepam (ATIVAN) 0.5 MG tablet Take 1 tablet (0.5 mg total) by mouth at bedtime as needed for anxiety (can take 1/4 or 1/2 tablet  if effective.). Patient taking differently: Take 0.125-0.5 mg by mouth at bedtime as needed for anxiety. 08/02/21   [name], MD  omeprazole (PRILOSEC) 40 MG capsule Take 40 mg by mouth every evening. 10/18/20   [provider]  rosuvastatin (CRESTOR) 10 MG tablet TAKE 1 TABLET BY MOUTH EVERY DAY Patient taking differently: Take 10 mg by mouth at bedtime. 11/01/20   [name], MD  sertraline (ZOLOFT) 100 MG tablet Take 100 mg by mouth every morning.    [provider]  sodium chloride (OCEAN) 0.65 % SOLN nasal spray Place 1 spray into both nostrils as needed for congestion.    [provider]  venetoclax (VENCLEXTA) 100 MG tablet Take 1 tablet (100 mg total) by mouth daily. Tablets should be swallowed whole with a meal and a full glass of water. Patient not taking: No sig reported 07/05/21   Volanda Napoleon, MD  warfarin (COUMADIN) 5 MG tablet TAKE AS DIRECTED BY COUMADIN CLINIC 09/17/21   Josue Hector, MD    Allergies    Hydrocodone-acetaminophen, Oxycodone hcl, Telmisartan-hctz, Ramipril, Aspirin, Atorvastatin, Buspirone hcl, Codeine, Morphine sulfate, Paroxetine, and Rabeprazole  Review of Systems   Review of Systems  Constitutional:  Positive for fever. Negative for chills.  HENT:   Negative for sore throat.   Respiratory:  Negative for cough.   Gastrointestinal:  Negative for nausea.  All other systems reviewed and are negative.  Physical Exam Updated Vital Signs BP 120/72 (BP Location: Right Arm)   Pulse 76   Temp (!) 100.4 F (38 C) (Oral)   Resp 18   Ht 5\' 10"  (1.778 m)   Wt 85.3 kg   SpO2 96%   BMI 26.98 kg/m   Physical Exam Vitals and nursing note reviewed.  Constitutional:      Appearance: He is well-developed.  HENT:     Head: Normocephalic and atraumatic.  Eyes:     Conjunctiva/sclera: Conjunctivae normal.  Cardiovascular:     Rate and Rhythm: Normal rate and regular rhythm.     Heart sounds: No murmur heard. Pulmonary:     Effort: Pulmonary effort is normal. No respiratory distress.     Breath sounds: Normal breath sounds.  Abdominal:     Palpations: Abdomen is soft.     Tenderness: There is no abdominal tenderness.  Musculoskeletal:     Cervical back: Neck supple.  Skin:    General: Skin is warm and dry.  Neurological:     Mental Status: He is alert.    ED Results / Procedures / Treatments   Labs (all labs ordered are listed, but only abnormal results are displayed) Labs Reviewed  CBC - Abnormal; Notable for the following components:      Result Value   WBC 2.7 (*)  RBC 3.55 (*)    Hemoglobin 10.7 (*)    HCT 33.1 (*)    Platelets 92 (*)    All other components within normal limits  BASIC METABOLIC PANEL - Abnormal; Notable for the following components:   Glucose, Bld 104 (*)    BUN 30 (*)    Creatinine, Ser 1.50 (*)    GFR, Estimated 48 (*)    All other components within normal limits  LACTIC ACID, PLASMA  LACTIC ACID, PLASMA    EKG None  Radiology No results found.  Procedures Procedures   Medications Ordered in ED Medications  sodium chloride 0.9 % bolus 1,000 mL (0 mLs Intravenous Stopped 09/22/21 1504)    ED Course  I have reviewed the triage vital signs and the nursing notes.  Pertinent labs &  imaging results that were available during my care of the patient were reviewed by me and considered in my medical decision making (see chart for details).    MDM Rules/Calculators/A&P                           MDM:  labs obtained and reviewed.  Pt is neutropenic.  He looks good.  Pt given IV fluids x 1 liter.  Pt observed.  His lactate is 1.2.  I doubt sepsis.  Pt advised to return if symptoms worsen or change.   Final Clinicalmpression(s) / ED Diagnoses Final diagnoses:  Fever, unspecified fever cause    Rx / DC Orders ED Discharge Orders     None     An After Visit Summary was printed and given to the patient.    Fransico Meadow, [place] 09/23/21 1224    Pattricia Boss, MD 09/25/21 [phone]

## 2021-09-24 ENCOUNTER — Ambulatory Visit (INDEPENDENT_AMBULATORY_CARE_PROVIDER_SITE_OTHER): Payer: No Typology Code available for payment source | Admitting: *Deleted

## 2021-09-24 ENCOUNTER — Encounter: Payer: Self-pay | Admitting: Family Medicine

## 2021-09-24 ENCOUNTER — Telehealth: Payer: Self-pay

## 2021-09-24 ENCOUNTER — Telehealth (INDEPENDENT_AMBULATORY_CARE_PROVIDER_SITE_OTHER): Payer: No Typology Code available for payment source | Admitting: Family Medicine

## 2021-09-24 ENCOUNTER — Other Ambulatory Visit: Payer: Self-pay

## 2021-09-24 VITALS — Temp 101.0°F

## 2021-09-24 DIAGNOSIS — T50B95A Adverse effect of other viral vaccines, initial encounter: Secondary | ICD-10-CM | POA: Diagnosis not present

## 2021-09-24 DIAGNOSIS — I480 Paroxysmal atrial fibrillation: Secondary | ICD-10-CM | POA: Diagnosis not present

## 2021-09-24 DIAGNOSIS — R5083 Postvaccination fever: Secondary | ICD-10-CM

## 2021-09-24 DIAGNOSIS — I2699 Other pulmonary embolism without acute cor pulmonale: Secondary | ICD-10-CM

## 2021-09-24 DIAGNOSIS — Z952 Presence of prosthetic heart valve: Secondary | ICD-10-CM

## 2021-09-24 DIAGNOSIS — Z5181 Encounter for therapeutic drug level monitoring: Secondary | ICD-10-CM | POA: Diagnosis not present

## 2021-09-24 LAB — POCT INR: INR: 2.3 (ref 2.0–3.0)

## 2021-09-24 NOTE — Progress Notes (Signed)
Phone [phone] Virtual visit via phonenote   Subjective:   Chief Complaint  Patient presents with   Follow-up    Fever,   This visit type was conducted due to national recommendations for restrictions regarding the COVID-19 Pandemic (e.g. social distancing).  This format is felt to be most appropriate for this patient at this time balancing risks to patient and risks to population by having him in for in person visit.  All issues noted in this document were discussed and addressed.  No physical exam was performed (except for noted visual exam or audio findings with Telehealth visits).  The patient has consented to conduct a Telehealth visit and understands insurance will be billed.   Our team/I connected with [name] at  4:20 PM EST by phone (patient did not have equipment for webex) and verified that I am speaking with the correct person using two identifiers.  Location patient: [facility] Location provider: [facility], office Persons participating in the virtual visit:  patient  Time on phone: 11 minutes  Our team/I discussed the limitations of evaluation and management by telemedicine and the availability of in person appointments. In light of current covid-19 pandemic, patient also understands that we are trying to protect them by minimizing in office contact if at all possible.  The patient expressed consent for telemedicine visit and agreed to proceed. Patient understands insurance will be billed.   Past Medical History-  Patient Active Problem List   Diagnosis Date Noted   S/P Bioprosthetic AVR (aortic valve replacement) in 2011 03/22/2015    Priority: High   Recurrent pulmonary embolism ([place]) 11/09/2014    Priority: High   Chronic lymphocytic leukemia ([place]) 11/11/2013    Priority: High   Atrial fibrillation ([place]) 04/20/2007    Priority: High   PROSTATE CANCER, HX OF 04/20/2007    Priority: High   Aortic atherosclerosis ([place]) 07/25/2021    Priority: Medium     PTSD (post-traumatic stress disorder) 09/29/2018    Priority: Medium    BPPV (benign paroxysmal positional vertigo) 09/04/2017    Priority: Medium    CKD (chronic kidney disease), stage III ([place]) 08/11/2017    Priority: Medium    Cervical spondylosis     Priority: Medium    Migraine 10/08/2010    Priority: Medium    Insomnia 02/03/2008    Priority: Medium    Anxiety state 09/06/2007    Priority: Medium    HLD (hyperlipidemia) 04/20/2007    Priority: Medium    Essential hypertension 04/20/2007    Priority: Medium    Former smoker 08/29/2016    Priority: Low   Lung nodule 11/02/2014    Priority: Low   GERD 12/11/2009    Priority: Low   MITRAL REGURGITATION 04/07/2009    Priority: Low   Febrile neutropenia ([place]) 08/19/2021   Pneumonia of right lower lobe due to infectious organism    Neutropenic fever (HCC)    FUO (fever of unknown origin)    Pancytopenia ([place])    Positive blood culture 07/09/2021   Anticoagulants causing adverse effect in therapeutic use 06/22/2021   Encounter for therapeutic drug monitoring 09/21/2019   History of pulmonary embolism 03/07/2015    Medications- reviewed and updated Current Outpatient Medications  Medication Sig Dispense Refill   Cholecalciferol (VITAMIN D3) 50 MCG (2000 UT) capsule Take 2,000 Units by mouth every morning.     [employer]S MOTION SICKNESS RELIEF 25 MG CHEW CHEW 1 TABLET EVERY DAY AS NEEDED FOR DIZZINESS 32 tablet 2  docusate sodium (COLACE) 100 MG capsule Take 100 mg by mouth daily as needed (constipation).     hydrochlorothiazide (HYDRODIURIL) 12.5 MG tablet TAKE 1 TABLET BY MOUTH EVERY DAY (Patient taking differently: Take 12.5 mg by mouth every morning.) 90 tablet 1   ipratropium (ATROVENT) 0.03 % nasal spray Place 2 sprays into both nostrils every 12 (twelve) hours. (Patient taking differently: Place 2 sprays into both nostrils 2 (two) times daily as needed for rhinitis (congestion).) 30 mL 12   LORazepam (ATIVAN) 0.5 MG tablet  Take 1 tablet (0.5 mg total) by mouth at bedtime as needed for anxiety (can take 1/4 or 1/2 tablet  if effective.). (Patient taking differently: Take 0.125-0.5 mg by mouth at bedtime as needed for anxiety.) 30 tablet 2   omeprazole (PRILOSEC) 40 MG capsule Take 40 mg by mouth every evening.     rosuvastatin (CRESTOR) 10 MG tablet TAKE 1 TABLET BY MOUTH EVERY DAY (Patient taking differently: Take 10 mg by mouth at bedtime.) 90 tablet 3   sertraline (ZOLOFT) 100 MG tablet Take 100 mg by mouth every morning.     sodium chloride (OCEAN) 0.65 % SOLN nasal spray Place 1 spray into both nostrils as needed for congestion.     venetoclax (VENCLEXTA) 100 MG tablet Take 1 tablet (100 mg total) by mouth daily. Tablets should be swallowed whole with a meal and a full glass of water. (Patient not taking: No sig reported) 30 tablet 4   warfarin (COUMADIN) 5 MG tablet TAKE AS DIRECTED BY COUMADIN CLINIC 160 tablet 1   No current facility-administered medications for this visit.     Objective:  Temp (!) 101 F (38.3 C)  self reported vitals  Nonlabored voice, normal speech         Assessment and Plan   # ED F/U for fever S: ED on 09/20/21:  Patient presented with a fever episode. His temperature was 101. Denied any chest pain, chills, confusion or headaches. Happened hours post covid vaccination on 09/20/2021.  -Labs and imaging negative. COVID swab and eventually came back negative along with flu test- he preferred to monitor at home and instead of waiting in the emergency room.  White count was low but neutrophils were normal range.  Lactic acid not elevated. ED on 09/22/21:  Patient returned back to ED due to temperature being 100.4. Felt like fever was worsening. Denied chills, cough or nausea.  Once again deemed safe for discharge  Reviewed records from Dr. Antonieta Pert team- nursing mentioned symptoms up to a week after covid vaccine  I can tell that patient is very discouraged by ongoing issues  with fevers-he is hoping for more long-term solution if possible-he asks about getting injections infusions with PEG filgrastim more regularly-I told him I was not sure about this method-would defer to [name] A/P: Patient now on day 5 of fevers after COVID-19 vaccination but feels fine otherwise-was able to do 90 minutes of tai chi today.  We discussed following up if has persistent symptoms for beyond a week-would also be interested in Dr. Antonieta Pert opinion-since he is feeling well other than fever-we did not opt forfurther work-up at this time  Recommended follow up:  Future Appointments  Date Time Provider [place]  10/01/2021  1:00 PM [facility] None  10/01/2021  1:45 PM Celso Amy, NP [facility] None  10/08/2021 11:45 AM [facility]  11/19/2021  1:00 PM [name], MD [facility]  01/11/2022 10:00 AM  Josue Hector, MD [facility]  01/21/2022  1:00 PM [facility] HEALTH COACH [facility]    Lab/Order associations:   ICD-10-CM   1. Fever after COVID-19 vaccination  R50.83    T50.B95A      I,Jada Bradford,acting as a scribe for Garret Reddish, MD.,have documented all relevant documentation on the behalf of Garret Reddish, MD,as directed by  Garret Reddish, MD while in the presence of Garret Reddish, MD.   I, Garret Reddish, MD, have reviewed all documentation for this visit. The documentation on 09/24/21 for the exam, diagnosis, procedures, and orders are all accurate and complete.   Return precautions advised.  Garret Reddish, MD

## 2021-09-24 NOTE — Telephone Encounter (Signed)
See below

## 2021-09-24 NOTE — Telephone Encounter (Signed)
LVM for patient to call back and get scheduled for Wednesday.

## 2021-09-24 NOTE — Telephone Encounter (Signed)
Patient scheduled an appointment for 10/12/21 "fever of 100 that lasted off and on for 4 days. I was discharged from [facility] with a temp of 100. After a saline drip. During the night the fever broke but came back the next day." Can we work patient in for an in office visit this week, patient is unable to come today due to conflicting appoitnment?

## 2021-09-24 NOTE — Patient Instructions (Addendum)
Description   Start taking Warfarin 1.5 tablets daily. Recheck INR in 2 weeks. [facility]in Clinic [phone].

## 2021-09-24 NOTE — Telephone Encounter (Signed)
Can use same day slot

## 2021-09-24 NOTE — Patient Instructions (Signed)
   Recommended follow up: No follow-ups on file.  

## 2021-09-27 ENCOUNTER — Encounter: Payer: Self-pay | Admitting: Hematology & Oncology

## 2021-09-28 ENCOUNTER — Inpatient Hospital Stay: Payer: No Typology Code available for payment source

## 2021-09-28 ENCOUNTER — Other Ambulatory Visit: Payer: Self-pay | Admitting: *Deleted

## 2021-09-28 DIAGNOSIS — C9110 Chronic lymphocytic leukemia of B-cell type not having achieved remission: Secondary | ICD-10-CM

## 2021-10-01 ENCOUNTER — Inpatient Hospital Stay (HOSPITAL_BASED_OUTPATIENT_CLINIC_OR_DEPARTMENT_OTHER): Payer: No Typology Code available for payment source | Admitting: Family

## 2021-10-01 ENCOUNTER — Other Ambulatory Visit: Payer: Self-pay

## 2021-10-01 ENCOUNTER — Encounter: Payer: Self-pay | Admitting: Family

## 2021-10-01 ENCOUNTER — Inpatient Hospital Stay: Payer: No Typology Code available for payment source

## 2021-10-01 VITALS — BP 119/73 | HR 70 | Temp 98.1°F | Resp 18 | Wt 190.0 lb

## 2021-10-01 DIAGNOSIS — D649 Anemia, unspecified: Secondary | ICD-10-CM

## 2021-10-01 DIAGNOSIS — C9110 Chronic lymphocytic leukemia of B-cell type not having achieved remission: Secondary | ICD-10-CM | POA: Diagnosis not present

## 2021-10-01 LAB — CMP (CANCER CENTER ONLY)
ALT: 16 U/L (ref 0–44)
AST: 24 U/L (ref 15–41)
Albumin: 3.8 g/dL (ref 3.5–5.0)
Alkaline Phosphatase: 90 U/L (ref 38–126)
Anion gap: 5 (ref 5–15)
BUN: 24 mg/dL — ABNORMAL HIGH (ref 8–23)
CO2: 31 mmol/L (ref 22–32)
Calcium: 10.7 mg/dL — ABNORMAL HIGH (ref 8.9–10.3)
Chloride: 103 mmol/L (ref 98–111)
Creatinine: 1.51 mg/dL — ABNORMAL HIGH (ref 0.61–1.24)
GFR, Estimated: 48 mL/min — ABNORMAL LOW (ref >60)
Glucose, Bld: 94 mg/dL (ref 70–99)
Potassium: 4.3 mmol/L (ref 3.5–5.1)
Sodium: 139 mmol/L (ref 135–145)
Total Bilirubin: 0.4 mg/dL (ref 0.3–1.2)
Total Protein: 6.1 g/dL — ABNORMAL LOW (ref 6.5–8.1)

## 2021-10-01 LAB — CBC WITH DIFFERENTIAL (CANCER CENTER ONLY)
Abs Immature Granulocytes: 0.02 10*3/uL (ref 0.00–0.07)
Basophils Absolute: 0 10*3/uL (ref 0.0–0.1)
Basophils Relative: 1 %
Eosinophils Absolute: 0.1 10*3/uL (ref 0.0–0.5)
Eosinophils Relative: 3 %
HCT: 30.3 % — ABNORMAL LOW (ref 39.0–52.0)
Hemoglobin: 9.7 g/dL — ABNORMAL LOW (ref 13.0–17.0)
Immature Granulocytes: 1 %
Lymphocytes Relative: 21 %
Lymphs Abs: 0.4 10*3/uL — ABNORMAL LOW (ref 0.7–4.0)
MCH: 30.3 pg (ref 26.0–34.0)
MCHC: 32 g/dL (ref 30.0–36.0)
MCV: 94.7 fL (ref 80.0–100.0)
Monocytes Absolute: 0.3 10*3/uL (ref 0.1–1.0)
Monocytes Relative: 17 %
Neutro Abs: 1.2 10*3/uL — ABNORMAL LOW (ref 1.7–7.7)
Neutrophils Relative %: 57 %
Platelet Count: 183 10*3/uL (ref 150–400)
RBC: 3.2 MIL/uL — ABNORMAL LOW (ref 4.22–5.81)
RDW: 14.8 % (ref 11.5–15.5)
WBC Count: 2 10*3/uL — ABNORMAL LOW (ref 4.0–10.5)
nRBC: 0 % (ref 0.0–0.2)

## 2021-10-01 LAB — SAVE SMEAR(SSMR), FOR PROVIDER SLIDE REVIEW

## 2021-10-01 NOTE — Progress Notes (Signed)
Hematology and Oncology Follow Up Visit  [name] [id number] 1946/04/28 75 y.o. 10/01/2021   Principle Diagnosis:  Right lower lobe pulmonary nodule- non-malignant Recurrent pulmonary embolism CLL -- progressive -- 11q-/13q- Chronic neutropenia secondary to chemotherapy   Current Therapy:        Coumadin 5 mg po q day Calquence 100 mg po BID -- start on 03/13/2020 -- d/c on 07/05/2021 Venetoclax 100 mg po q day -- change 07/05/2021 -- d/c on 07/05/2021 Neulasta 6 mg subcu as needed                                       Interim History:  [name] is here today for follow-up. He is doing fairly well but notes constant fatigue and weakness which he states is not his norm.  He has mild SOB with over exertion and takes a break to rest as needed.  He has history of mitral valve regurgitation. His ECHO in September showed an EF of 55-60%. He has not noted any blood loss. No bruising or petechiae.  WBC count is 2.0, ANC 1.2, Hgb 9.7, MCV 94 and platelets 183.  No fever, chills, cough, rash, dizziness, SOB, chest pain, palpitations, abdominal pain or changes in bowel or bladder habits.  No swelling, tenderness, numbness or tingling in his extremities at this time.  He has had some issues feeling off balance but thankfully has had no falls or syncopal episodes.  He states that he has nausea (no vomiting) with food and his appetite is down. He has been staying well hydrated. His weight is 188 lbs.   ECOG Performance Status: 1 - Symptomatic but completely ambulatory  Medications:  Allergies as of 10/01/2021       Reactions   Hydrocodone-acetaminophen Shortness Of Breath   Oxycodone Hcl Shortness Of Breath   Telmisartan-hctz Shortness Of Breath, Palpitations, Other (See Comments)   Dizziness also   Ramipril Other (See Comments)   Unknown reaction   Aspirin Rash, Other (See Comments)   Confusion, also- Cannot take high doses   Atorvastatin Other (See Comments)   Myalgia    Buspirone Hcl Other (See Comments)   Headaches   Codeine Hives, Nausea And Vomiting, Other (See Comments)   Confusion also   Morphine Sulfate Itching   Paroxetine Other (See Comments)   Paresthesias: R arm, R leg, r side of face   Rabeprazole Other (See Comments)   headache        Medication List        Accurate as of October 01, 2021  1:47 PM. If you have any questions, ask your nurse or doctor.          [facility] Motion Sickness Relief 25 MG Chew Generic drug: Meclizine HCl CHEW 1 TABLET EVERY DAY AS NEEDED FOR DIZZINESS   docusate sodium 100 MG capsule Commonly known as: COLACE Take 100 mg by mouth daily as needed (constipation).   hydrochlorothiazide 12.5 MG tablet Commonly known as: HYDRODIURIL TAKE 1 TABLET BY MOUTH EVERY DAY What changed: when to take this   ipratropium 0.03 % nasal spray Commonly known as: ATROVENT Place 2 sprays into both nostrils every 12 (twelve) hours. What changed:  when to take this reasons to take this   LORazepam 0.5 MG tablet Commonly known as: ATIVAN Take 1 tablet (0.5 mg total) by mouth at bedtime as needed for anxiety (can take 1/4 or 1/2  tablet  if effective.). What changed:  how much to take reasons to take this   omeprazole 40 MG capsule Commonly known as: PRILOSEC Take 40 mg by mouth every evening.   rosuvastatin 10 MG tablet Commonly known as: CRESTOR TAKE 1 TABLET BY MOUTH EVERY DAY What changed: when to take this   sertraline 100 MG tablet Commonly known as: ZOLOFT Take 100 mg by mouth every morning.   sodium chloride 0.65 % Soln nasal spray Commonly known as: OCEAN Place 1 spray into both nostrils as needed for congestion.   venetoclax 100 MG tablet Commonly known as: VENCLEXTA Take 1 tablet (100 mg total) by mouth daily. Tablets should be swallowed whole with a meal and a full glass of water.   Vitamin D3 50 MCG (2000 UT) capsule Take 2,000 Units by mouth every morning.   warfarin 5 MG tablet Commonly  known as: COUMADIN Take as directed by the anticoagulation clinic. If you are unsure how to take this medication, talk to your nurse or doctor. Original instructions: TAKE AS DIRECTED BY COUMADIN CLINIC        Allergies:  Allergies  Allergen Reactions   Hydrocodone-Acetaminophen Shortness Of Breath   Oxycodone Hcl Shortness Of Breath   Telmisartan-Hctz Shortness Of Breath, Palpitations and Other (See Comments)    Dizziness also   Ramipril Other (See Comments)    Unknown reaction   Aspirin Rash and Other (See Comments)    Confusion, also- Cannot take high doses   Atorvastatin Other (See Comments)    Myalgia    Buspirone Hcl Other (See Comments)    Headaches    Codeine Hives, Nausea And Vomiting and Other (See Comments)    Confusion also   Morphine Sulfate Itching   Paroxetine Other (See Comments)    Paresthesias: R arm, R leg, r side of face   Rabeprazole Other (See Comments)    headache    Past Medical History, Surgical history, Social history, and Family History were reviewed and updated.  Review of Systems: All other 10 point review of systems is negative.   Physical Exam:  vitals were not taken for this visit.   Wt Readings from Last 3 Encounters:  09/22/21 188 lb (85.3 kg)  09/08/21 191 lb (86.6 kg)  09/05/21 191 lb 6.4 oz (86.8 kg)    Ocular: Sclerae unicteric, pupils equal, round and reactive to light Ear-nose-throat: Oropharynx clear, dentition fair Lymphatic: No cervical or supraclavicular adenopathy Lungs no rales or rhonchi, good excursion bilaterally Heart regular rate and rhythm, no murmur appreciated Abd soft, nontender, positive bowel sounds MSK no focal spinal tenderness, no joint edema Neuro: non-focal, well-oriented, appropriate affect Breasts: Deferred   Lab Results  Component Value Date   WBC 2.0 (L) 10/01/2021   HGB 9.7 (L) 10/01/2021   HCT 30.3 (L) 10/01/2021   MCV 94.7 10/01/2021   PLT 183 10/01/2021   Lab Results  Component  Value Date   FERRITIN 365 (H) 07/11/2021   Lab Results  Component Value Date   RETICCTPCT 1.04 05/06/2011   RBC 3.20 (L) 10/01/2021   RETICCTABS 51.79 05/06/2011   Lab Results  Component Value Date   KPAFRELGTCHN 22.8 (H) 07/05/2021   LAMBDASER 13.9 07/05/2021   KAPLAMBRATIO 1.64 07/05/2021   Lab Results  Component Value Date   IGGSERUM 632 07/26/2021   IGA 79 07/26/2021   IGMSERUM 41 07/26/2021   Lab Results  Component Value Date   TOTALPROTELP 6.1 07/26/2021   ALBUMINELP 3.6 07/26/2021  A1GS 0.3 07/26/2021   A2GS 0.6 07/26/2021   BETS 1.0 07/26/2021   GAMS 0.6 07/26/2021   MSPIKE Not Observed 07/26/2021   SPEI Comment 07/10/2021     Chemistry      Component Value Date/Time   NA 139 10/01/2021 1256   NA 145 07/22/2017 0756   NA 139 06/27/2016 1410   K 4.3 10/01/2021 1256   K 4.3 07/22/2017 0756   K 5.1 06/27/2016 1410   CL 103 10/01/2021 1256   CL 105 07/22/2017 0756   CO2 31 10/01/2021 1256   CO2 30 07/22/2017 0756   CO2 28 06/27/2016 1410   BUN 24 (H) 10/01/2021 1256   BUN 17 07/22/2017 0756   BUN 20.9 06/27/2016 1410   CREATININE 1.51 (H) 10/01/2021 1256   CREATININE 1.6 (H) 07/22/2017 0756   CREATININE 1.6 (H) 06/27/2016 1410      Component Value Date/Time   CALCIUM 10.7 (H) 10/01/2021 1256   CALCIUM 9.6 07/22/2017 0756   CALCIUM 9.6 06/27/2016 1410   ALKPHOS 90 10/01/2021 1256   ALKPHOS 59 07/22/2017 0756   ALKPHOS 62 06/27/2016 1410   AST 24 10/01/2021 1256   AST 18 06/27/2016 1410   ALT 16 10/01/2021 1256   ALT 24 07/22/2017 0756   ALT 24 06/27/2016 1410   BILITOT 0.4 10/01/2021 1256   BILITOT 0.46 06/27/2016 1410       Impression and Plan: [name] is a pleasant 75 yo African American gentleman with recurrent CLL and chronic neutropenia secondary to previous chemotherapy.  He is anemia today which could certainly be contributing to his fatigue. We have added iron studies and an epo level to his lab work today. We can replace if  needed.  CBC with diff and CMP reviewed with [name]. No Neulasta this visit per MD.  Follow-up in 1 month.  He can contact our office with any questions or concerns.   Lottie Dawson, NP 11/21/20221:47 PM

## 2021-10-02 ENCOUNTER — Encounter: Payer: Self-pay | Admitting: Hematology & Oncology

## 2021-10-02 ENCOUNTER — Other Ambulatory Visit: Payer: Self-pay | Admitting: Family

## 2021-10-02 LAB — IRON AND TIBC
Iron: 42 ug/dL (ref 42–163)
Saturation Ratios: 12 % — ABNORMAL LOW (ref 20–55)
TIBC: 340 ug/dL (ref 202–409)
UIBC: 298 ug/dL (ref 117–376)

## 2021-10-02 LAB — IGG, IGA, IGM
IgA: 69 mg/dL (ref 61–437)
IgG (Immunoglobin G), Serum: 551 mg/dL — ABNORMAL LOW (ref 603–1613)
IgM (Immunoglobulin M), Srm: 34 mg/dL (ref 15–143)

## 2021-10-02 LAB — FERRITIN: Ferritin: 860 ng/mL — ABNORMAL HIGH (ref 24–336)

## 2021-10-03 LAB — ERYTHROPOIETIN: Erythropoietin: 9.6 m[IU]/mL (ref 2.6–18.5)

## 2021-10-06 LAB — SURGICAL PATHOLOGY

## 2021-10-07 ENCOUNTER — Other Ambulatory Visit: Payer: Self-pay | Admitting: Family Medicine

## 2021-10-07 ENCOUNTER — Encounter: Payer: Self-pay | Admitting: Family Medicine

## 2021-10-08 ENCOUNTER — Telehealth: Payer: Self-pay | Admitting: *Deleted

## 2021-10-08 ENCOUNTER — Encounter: Payer: Self-pay | Admitting: Hematology & Oncology

## 2021-10-08 ENCOUNTER — Other Ambulatory Visit: Payer: Self-pay | Admitting: Family

## 2021-10-08 ENCOUNTER — Other Ambulatory Visit: Payer: Self-pay

## 2021-10-08 ENCOUNTER — Ambulatory Visit (INDEPENDENT_AMBULATORY_CARE_PROVIDER_SITE_OTHER): Payer: No Typology Code available for payment source | Admitting: *Deleted

## 2021-10-08 DIAGNOSIS — I480 Paroxysmal atrial fibrillation: Secondary | ICD-10-CM | POA: Diagnosis not present

## 2021-10-08 DIAGNOSIS — Z5181 Encounter for therapeutic drug level monitoring: Secondary | ICD-10-CM | POA: Diagnosis not present

## 2021-10-08 DIAGNOSIS — Z952 Presence of prosthetic heart valve: Secondary | ICD-10-CM

## 2021-10-08 DIAGNOSIS — I2699 Other pulmonary embolism without acute cor pulmonale: Secondary | ICD-10-CM | POA: Diagnosis not present

## 2021-10-08 DIAGNOSIS — D631 Anemia in chronic kidney disease: Secondary | ICD-10-CM | POA: Insufficient documentation

## 2021-10-08 DIAGNOSIS — D509 Iron deficiency anemia, unspecified: Secondary | ICD-10-CM | POA: Insufficient documentation

## 2021-10-08 LAB — POCT INR: INR: 2.2 (ref 2.0–3.0)

## 2021-10-08 LAB — FLOW CYTOMETRY

## 2021-10-08 NOTE — Telephone Encounter (Signed)
Also per Judson Roch- your iron saturation is low so he will need 3 doses of IV iron which has been scheduled. He will also need to start on Retacrit injections to replace his low erythropoietin level (the protein produced by his kidneys that stimulates red blood cell production). This can not be given the same week as IV iron so we will start it at his next follow up which will be the week after his 3rd dose of IV iron. Thank you!

## 2021-10-08 NOTE — Patient Instructions (Signed)
Description   Take 2 tablets of warfarin today.  Then start taking warfarin 1.5 tablets daily except for 2 tablets on Sundays. Recheck INR in 2 weeks. [facility] [phone].

## 2021-10-08 NOTE — Telephone Encounter (Signed)
-----   Message from Celso Amy, NP sent at 10/08/2021  9:55 AM EST ----- Iron saturation is low so he will need 3 doses of IV iron which has been scheduled. He will also need to start on Retacrit injections to replace his low erythropoietin level (the protein produced by his kidneys that stimulates red blood cell production). This can not be given the same week as IV iron so we will start it at his next follow up which will be the week after his 3rd dose of IV iron. Thank you!  ----- Message ----- From: Interface, Lab In [place] Sent: 10/02/2021  10:27 AM EST To: Celso Amy, NP

## 2021-10-08 NOTE — Telephone Encounter (Signed)
Per scheduling message Judson Roch - called and gave upcoming appointment - confirmed - lab- f/u and injection

## 2021-10-10 ENCOUNTER — Other Ambulatory Visit: Payer: Medicare Other

## 2021-10-10 ENCOUNTER — Ambulatory Visit: Payer: Medicare Other | Admitting: Hematology & Oncology

## 2021-10-11 ENCOUNTER — Inpatient Hospital Stay: Payer: No Typology Code available for payment source | Attending: Hematology & Oncology

## 2021-10-11 ENCOUNTER — Other Ambulatory Visit: Payer: Self-pay

## 2021-10-11 VITALS — BP 121/68 | HR 74 | Temp 97.9°F | Resp 18 | Ht 70.0 in | Wt 182.0 lb

## 2021-10-11 DIAGNOSIS — I2699 Other pulmonary embolism without acute cor pulmonale: Secondary | ICD-10-CM | POA: Diagnosis not present

## 2021-10-11 DIAGNOSIS — D701 Agranulocytosis secondary to cancer chemotherapy: Secondary | ICD-10-CM | POA: Diagnosis not present

## 2021-10-11 DIAGNOSIS — D509 Iron deficiency anemia, unspecified: Secondary | ICD-10-CM

## 2021-10-11 DIAGNOSIS — D649 Anemia, unspecified: Secondary | ICD-10-CM | POA: Diagnosis not present

## 2021-10-11 DIAGNOSIS — R911 Solitary pulmonary nodule: Secondary | ICD-10-CM | POA: Diagnosis not present

## 2021-10-11 DIAGNOSIS — Z5189 Encounter for other specified aftercare: Secondary | ICD-10-CM | POA: Insufficient documentation

## 2021-10-11 DIAGNOSIS — D631 Anemia in chronic kidney disease: Secondary | ICD-10-CM

## 2021-10-11 DIAGNOSIS — C9110 Chronic lymphocytic leukemia of B-cell type not having achieved remission: Secondary | ICD-10-CM | POA: Insufficient documentation

## 2021-10-11 DIAGNOSIS — Z7901 Long term (current) use of anticoagulants: Secondary | ICD-10-CM | POA: Diagnosis not present

## 2021-10-11 DIAGNOSIS — D709 Neutropenia, unspecified: Secondary | ICD-10-CM | POA: Diagnosis not present

## 2021-10-11 MED ORDER — SODIUM CHLORIDE 0.9 % IV SOLN
300.0000 mg | Freq: Once | INTRAVENOUS | Status: AC
  Administered 2021-10-11: 300 mg via INTRAVENOUS
  Filled 2021-10-11: qty 10

## 2021-10-11 MED ORDER — SODIUM CHLORIDE 0.9 % IV SOLN: Freq: Once | INTRAVENOUS | Status: AC

## 2021-10-11 NOTE — Patient Instructions (Signed)

## 2021-10-12 ENCOUNTER — Ambulatory Visit: Payer: Medicare Other

## 2021-10-12 ENCOUNTER — Ambulatory Visit: Payer: Medicare Other | Admitting: Family Medicine

## 2021-10-15 ENCOUNTER — Ambulatory Visit: Payer: Medicare Other | Admitting: Cardiovascular Disease

## 2021-10-15 ENCOUNTER — Other Ambulatory Visit (HOSPITAL_COMMUNITY): Payer: Medicare Other

## 2021-10-19 ENCOUNTER — Inpatient Hospital Stay: Payer: No Typology Code available for payment source

## 2021-10-19 ENCOUNTER — Other Ambulatory Visit: Payer: Self-pay

## 2021-10-19 VITALS — BP 118/63 | HR 69 | Temp 98.0°F | Resp 17

## 2021-10-19 DIAGNOSIS — D631 Anemia in chronic kidney disease: Secondary | ICD-10-CM

## 2021-10-19 DIAGNOSIS — D509 Iron deficiency anemia, unspecified: Secondary | ICD-10-CM

## 2021-10-19 DIAGNOSIS — C9110 Chronic lymphocytic leukemia of B-cell type not having achieved remission: Secondary | ICD-10-CM | POA: Diagnosis not present

## 2021-10-19 MED ORDER — SODIUM CHLORIDE 0.9 % IV SOLN
300.0000 mg | Freq: Once | INTRAVENOUS | Status: AC
  Administered 2021-10-19: 300 mg via INTRAVENOUS
  Filled 2021-10-19: qty 200

## 2021-10-19 MED ORDER — SODIUM CHLORIDE 0.9 % IV SOLN: Freq: Once | INTRAVENOUS | Status: AC

## 2021-10-19 NOTE — Progress Notes (Signed)
Pt declined to wait for full 30 minute post observation. dph

## 2021-10-19 NOTE — Patient Instructions (Signed)

## 2021-10-22 ENCOUNTER — Other Ambulatory Visit: Payer: Self-pay

## 2021-10-23 ENCOUNTER — Encounter: Payer: Self-pay | Admitting: Hematology & Oncology

## 2021-10-24 ENCOUNTER — Other Ambulatory Visit: Payer: Self-pay

## 2021-10-24 ENCOUNTER — Ambulatory Visit: Payer: Medicare Other | Admitting: *Deleted

## 2021-10-24 DIAGNOSIS — I2699 Other pulmonary embolism without acute cor pulmonale: Secondary | ICD-10-CM

## 2021-10-24 DIAGNOSIS — Z5181 Encounter for therapeutic drug level monitoring: Secondary | ICD-10-CM

## 2021-10-24 DIAGNOSIS — Z952 Presence of prosthetic heart valve: Secondary | ICD-10-CM

## 2021-10-24 DIAGNOSIS — I480 Paroxysmal atrial fibrillation: Secondary | ICD-10-CM | POA: Diagnosis not present

## 2021-10-24 LAB — POCT INR: INR: 3.9 — AB (ref 2.0–3.0)

## 2021-10-24 NOTE — Patient Instructions (Signed)
Description   Do not take any Warfarin today then continue taking warfarin 1.5 tablets daily except for 2 tablets on Sundays. Recheck INR in 2 weeks. [facility] [phone].

## 2021-10-26 ENCOUNTER — Other Ambulatory Visit: Payer: Self-pay

## 2021-10-26 ENCOUNTER — Inpatient Hospital Stay: Payer: No Typology Code available for payment source

## 2021-10-26 VITALS — BP 133/72 | HR 66 | Temp 97.8°F | Resp 16

## 2021-10-26 DIAGNOSIS — D631 Anemia in chronic kidney disease: Secondary | ICD-10-CM

## 2021-10-26 DIAGNOSIS — D509 Iron deficiency anemia, unspecified: Secondary | ICD-10-CM

## 2021-10-26 DIAGNOSIS — C9110 Chronic lymphocytic leukemia of B-cell type not having achieved remission: Secondary | ICD-10-CM | POA: Diagnosis not present

## 2021-10-26 MED ORDER — SODIUM CHLORIDE 0.9 % IV SOLN
300.0000 mg | Freq: Once | INTRAVENOUS | Status: AC
  Administered 2021-10-26: 300 mg via INTRAVENOUS
  Filled 2021-10-26: qty 300

## 2021-10-26 MED ORDER — SODIUM CHLORIDE 0.9 % IV SOLN: Freq: Once | INTRAVENOUS | Status: AC

## 2021-10-26 NOTE — Progress Notes (Signed)
Pt. Refused to wait 30 minutes post infusion. Released stable and ASX. 

## 2021-10-26 NOTE — Patient Instructions (Signed)

## 2021-10-29 NOTE — Progress Notes (Signed)
Phone [phone] In person visit   Subjective:   [name] is a 75 y.o. year old very pleasant male patient who presents for/with See problem oriented charting Chief Complaint  Patient presents with   Follow-up    Pt spouse states pt is very weak and passed out this morning before his appointment trying to get in the car.    Hypertension   Hyperlipidemia   Gastroesophageal Reflux   Anxiety    This visit occurred during the SARS-CoV-2 public health emergency.  Safety protocols were in place, including screening questions prior to the visit, additional usage of staff PPE, and extensive cleaning of exam room while observing appropriate contact time as indicated for disinfecting solutions.   Past Medical History-  Patient Active Problem List   Diagnosis Date Noted   S/P Bioprosthetic AVR (aortic valve replacement) in 2011 03/22/2015    Priority: High   Recurrent pulmonary embolism (HCC) 11/09/2014    Priority: High   Chronic lymphocytic leukemia (HCC) 11/11/2013    Priority: High   Atrial fibrillation (HCC) 04/20/2007    Priority: High   PROSTATE CANCER, HX OF 04/20/2007    Priority: High   Aortic atherosclerosis (HCC) 07/25/2021    Priority: Medium    PTSD (post-traumatic stress disorder) 09/29/2018    Priority: Medium    BPPV (benign paroxysmal positional vertigo) 09/04/2017    Priority: Medium    CKD (chronic kidney disease), stage III (HCC) 08/11/2017    Priority: Medium    Cervical spondylosis     Priority: Medium    Migraine 10/08/2010    Priority: Medium    Insomnia 02/03/2008    Priority: Medium    Anxiety state 09/06/2007    Priority: Medium    HLD (hyperlipidemia) 04/20/2007    Priority: Medium    Essential hypertension 04/20/2007    Priority: Medium    Former smoker 08/29/2016    Priority: Low   Lung nodule 11/02/2014    Priority: Low   GERD 12/11/2009    Priority: Low   MITRAL REGURGITATION 04/07/2009    Priority: Low   IDA (iron  deficiency anemia) 10/08/2021   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/2021   Febrile neutropenia (HCC) 08/19/2021   Pneumonia of right lower lobe due to infectious organism    Neutropenic fever (HCC)    FUO (fever of unknown origin)    Pancytopenia (HCC)    Positive blood culture 07/09/2021   Anticoagulants causing adverse effect in therapeutic use 06/22/2021   Encounter for therapeutic drug monitoring 09/21/2019   History of pulmonary embolism 03/07/2015    Medications- reviewed and updated Current Outpatient Medications  Medication Sig Dispense Refill   Cholecalciferol (VITAMIN D3) 50 MCG (2000 UT) capsule Take 2,000 Units by mouth every morning.     [facility] MOTION SICKNESS RELIEF 25 MG CHEW CHEW 1 TABLET EVERY DAY AS NEEDED FOR DIZZINESS (Patient taking differently: Chew 1 tablet by mouth daily as needed (motion sickness).) 32 tablet 2   docusate sodium (COLACE) 100 MG capsule Take 100 mg by mouth daily as needed (constipation).     ipratropium (ATROVENT) 0.03 % nasal spray Place 2 sprays into both nostrils every 12 (twelve) hours. (Patient taking differently: Place 2 sprays into both nostrils 2 (two) times daily as needed for rhinitis (congestion).) 30 mL 12   LORazepam (ATIVAN) 0.5 MG tablet Take 1 tablet (0.5 mg total) by mouth at bedtime as needed for anxiety (can take 1/4 or 1/2 tablet  if effective.). (Patient taking  differently: Take 0.125-0.5 mg by mouth at bedtime as needed for anxiety.) 30 tablet 2   omeprazole (PRILOSEC) 40 MG capsule Take 40 mg by mouth every evening.     rosuvastatin (CRESTOR) 10 MG tablet TAKE 1 TABLET BY MOUTH EVERY DAY 90 tablet 3   sertraline (ZOLOFT) 100 MG tablet Take 100 mg by mouth every morning.     sodium chloride (OCEAN) 0.65 % SOLN nasal spray Place 1 spray into both nostrils as needed for congestion.     warfarin (COUMADIN) 5 MG tablet TAKE AS DIRECTED BY COUMADIN CLINIC 160 tablet 1   dronabinol (MARINOL) 5 MG capsule Take 1 capsule (5 mg total)  by mouth 2 (two) times daily before a meal. 60 capsule 1   No current facility-administered medications for this visit.     Objective:  BP 100/68    Pulse 76    Temp 97.6 F (36.4 C)    Ht $R'5\' 10"'RV$  (1.778 m)    Wt 173 lb (78.5 kg)    SpO2 97%    BMI 24.82 kg/m  Gen: NAD, resting comfortably CV: RRR no murmurs rubs or gallops Lungs: CTAB no crackles, wheeze, rhonchi Abdomen: soft/nontender/nondistended/normal bowel sounds. No rebound or guarding.  Ext: no edema Skin: warm, dry, no rash Neuro: CN II-XII intact, sensation and reflexes normal throughout, 5-/5 muscle strength in bilateral upper and lower extremities (all equal). Normal finger to nose. Normal rapid alternating movements. No pronator drift. Normal romberg. Normal gait.  Seems somewhat slowed compared to normal responses. Appears imbalanced with standing- reports lightheadedness (2 bottles of water next to him)    Assessment and Plan   # Generalized weakness/weight loss S:patient has been feeling very weak for a few months- seemed to start after prolonged fever with covid 19. He had significant weak spell this morning when went to stand up and get in the car before his visit. Niece with him today (he usually comes alone)   Per patient- Patient states a lot of times in the morning hard to get his feet stabilized. He tripped and fell backwards into the car. He states did not lose consciousness but niece feels like he did- she states he collapsed forward onto her - legs gave way and she pulled him to the car (hit back of head on top of car door- no headache). She sat him in seat and pulled legs in (she states body was limp and eyes were closed) and he seemed to come to. He states remembers these events and felt very weak. I do not anticipate she could have gotten him into the car if he had a true syncopal episode. He did not take ativan within 12 hours prior to fall- did not take last night- took afterwards as felt week. He states not  taking lorazepam regularly  Had weakness, hypotension leading to ED visit on 11/07/21- was orthostatic at that time- also had covid testing, bloodwork, ct head, EKG- he states did not pass out- went to ground but did not have strength to get to ground. He state bent forward to the ground and trying to get back up felt lightheaded- couldn't get back up into the chair and went onto the ground.   He has lost 18 lbs from last in person visit in October. Reports appetite much lower. He was having trouble getting marinol for appetite.    Thankfully he stopped hctz already- we removed from med list today.   Labs just 3 days ago- white count  was still low at 2.8k, anemia was stable at 9.9 g. Cmp stable with GFR at 49- protein slightly low but has been and actually slightly improved.   Has not been doing Tai Chi in over 3 months or more. Using wheelchair for transport fair amount of the time.   No fevers recently.   A/P: patient with significant increase in weakness from baseline over last 4 months and would have fallen without support earlier today with significant weak spell (he denies syncope- no chest pain or shortness of breath with spell, suspect orthostatic). Also does not seem as mentally clear.  A lot of stress with chemo treatments, neutropenia, recurrent fevers recently. Seems to have substantial overall decline. Offered palliative care referral- he wants to hold off for now -hit head earlier but declies head CT - no headache -hold hctz- permanently removed form meds- was not taking with weak spell earlier - encouraged to push fluids aggressively - can even liberalize salt - less mental clarity - he firmly declines syncope- remembers process of fall and has fluids planned tomorrow and he wants to wait and see how he feels after this instead of  -losing weight- I think these last few months have been very tough on him - add b12 -short term hold off on crestor  -recheck 1 month or sooner if  needed -no chest pain or shortness of breath and seems orthostatic- we opted out of further cardiac workup at present -no clear neuro deficits- generally weak  #CLL-patient on venetoclax in addition to Calquence in past- now on hold waiting on updated biopsy on lymph node-regular follow-up with oncology  # Atrial fibrillation-follows with cardiology #Recurrent pulmonary embolism-on chronic Coumadin #Status post bioprosthetic AVR S: Rate controlled without medicine Anticoagulated with Coumadin-changed to this by cardiology due to thrombus on AVR  A/P: CLL- in tough spot due to side effects of medicine- also awaiting new biopsy result to determine next steps- meds on hold- will awiat ocology next steps  A fib- appropriately anticoagulated/rate controlled without meds. Coumadin also for recurrent PE- no evidence of worsening symptoms - also helpful for s/p AVR.    #hypertension S: medication: off all meds- Hydrochlorothiazide 12.5 mg every day in past BP Readings from Last 3 Encounters:  11/19/21 100/68  11/16/21 115/72  11/14/21 131/74  A/P: much lower even without meds- likely contributing to orthostatic issues- encouraged pushing fluids aggressively  #Chronic kidney disease stage III S: GFR is typically in the 40s range - 49 on last check -Patient knows to avoid NSAIDs  A/P: stable just 3 days ago- continue to monitor   #hyperlipidemia #aortic atherosclerosis S: Medication: Crestor 10 mg daily Lab Results  Component Value Date   CHOL 170 05/18/2021   HDL 35.80 (L) 05/18/2021   LDLCALC 110 (H) 05/12/2020   LDLDIRECT 95.0 05/18/2021   TRIG 238.0 (H) 05/18/2021   CHOLHDL 5 05/18/2021   A/P: Short term lets trial off of rosuvastatin/crestor 10 mg just to see if that helps with strength at all  # Anxiety/PTSD S:Medication: Sertraline 100 mg every morning, Ativan 0.5 mg-able to cut in half and use-states not using lately- except anxious after fall today -Knew to avoid driving for  8 hours after use   Counseling: Continued to work with therapist Dr. Cheryln Manly A/P: anxiety up some with recent stressors- encouraged follow up with Dr. Cheryln Manly- hold off on med changes for now   Recommended follow up: Return in about 1 month (around 12/20/2021) for follow up- or sooner  if needed. If you have chest pain, shortness of breath, worsening weakness please seek care immediately- verbally discussed Future Appointments  Date Time Provider [place]  11/29/2021  1:00 PM Oren Binet, PhD [facility] None  11/29/2021  2:00 PM [facility]  01/11/2022 10:00 AM Josue Hector, MD [facility]  01/21/2022  1:00 PM [facility]   Lab/Order associations:   ICD-10-CM   1. Generalized weakness  R53.1 Ambulatory referral to Home Health    2. Chronic lymphocytic leukemia ([place])  C91.10 Ambulatory referral to Home Health    3. Essential hypertension  I10     4. Hyperlipidemia, unspecified hyperlipidemia type  E78.5     5. Atrial fibrillation, unspecified type ([place])  I48.91     6. Stage 3 chronic kidney disease, unspecified whether stage 3a or 3b CKD (HCC)  N18.30     7. Recurrent pulmonary embolism (HCC) Chronic I26.99     8. Anxiety state  F41.1      No orders of the defined types were placed in this encounter.  Time Spent: 45 minutes of total time (2:02 PM- 2:47 PM) was spent on the date of the encounter performing the following actions: chart review prior to seeing the patient, obtaining history, performing a medically necessary exam, counseling on the treatment plan, placing orders, and documenting in our EHR.   I,Jada Bradford,acting as a scribe for Garret Reddish, MD.,have documented all relevant documentation on the behalf of Garret Reddish, MD,as directed by  Garret Reddish, MD while in the presence of Garret Reddish, MD.  I, Garret Reddish, MD, have reviewed all documentation for this  visit. The documentation on 11/19/21 for the exam, diagnosis, procedures, and orders are all accurate and complete.   Return precautions advised.  Garret Reddish, MD

## 2021-11-01 ENCOUNTER — Ambulatory Visit (INDEPENDENT_AMBULATORY_CARE_PROVIDER_SITE_OTHER): Payer: Medicare Other | Admitting: Psychology

## 2021-11-01 DIAGNOSIS — F4323 Adjustment disorder with mixed anxiety and depressed mood: Secondary | ICD-10-CM

## 2021-11-01 NOTE — Progress Notes (Signed)
Date: 11/01/2021  Treatment Plan: Diagnosis F99 (Unspecified mental disorder) [n/a]  309.28 (Adjustment disorder with mixed anxiety and depressed mood) [n/a]  Symptoms Depressed or irritable mood. (Status: maintained) -- No Description Entered  Diminished interest in or enjoyment of activities. (Status: maintained) -- No Description Entered  Experiences disturbing and persistent thoughts, images, and/or perceptions of the traumatic event. (Status: maintained) -- No Description Entered  Has been exposed to a traumatic event involving actual or perceived threat of death or serious injury. (Status: maintained) -- No Description Entered  Intentionally avoids thoughts, feelings, or discussions related to the traumatic event. (Status: maintained) -- No Description Entered  Lack of energy. (Status: maintained) -- No Description Entered  Sad affect, social withdrawal, anxiety, loss of interest in activities, and low energy. (Status: maintained) -- No Description Entered  Symptoms present more than one month. (Status: maintained) -- No Description Entered  Medication Status compliance  Safety none  If Suicidal or Homicidal State Action Taken: unspecified  Current Risk: low Medications Lorazepam (Dosage: .5mg )  Meclizine (Dosage: unknown)  Sertrline (Dosage: 100mg )  Xarelto (Dosage: unknown)  Objectives Related Problem: Develop healthy thinking patterns and beliefs about self, others, and the world that lead to the alleviation and help prevent the relapse of depression. Description: Implement mindfulness techniques for relapse prevention. Target Date: 2022-03-08 Frequency: Daily Modality: individual Progress: 50%  Related Problem: Develop healthy thinking patterns and beliefs about self, others, and the world that lead to the alleviation and help prevent the relapse of depression. Description: Learn and implement conflict resolution skills to resolve interpersonal problems. Target Date:  2022-03-08 Frequency: Daily Modality: individual Progress: 60%  Related Problem: Develop healthy thinking patterns and beliefs about self, others, and the world that lead to the alleviation and help prevent the relapse of depression. Description: Learn and implement behavioral strategies to overcome depression. Target Date: 2022-03-08 Frequency: Daily Modality: individual Progress: 50%  Related Problem: Develop healthy thinking patterns and beliefs about self, others, and the world that lead to the alleviation and help prevent the relapse of depression. Description: Describe current and past experiences with depression including their impact on functioning and attempts to resolve it. Target Date: 2022-03-08 Frequency: Daily Modality: individual Progress: 60%  Client Response full compliance  Service Location Location, [address] [place], [place] [address]  Service Code cpt [phone]  Self care activities  Emotion regulation skills  Distress tolerance skill  Validate/empathize  Identified an insight  Facilitate problem solving  Normalize/Reframe  Comments  Dx: F43.21  [name] agreed to having a video session due to the pandemic. He was at home and I was in my home office. Meds: Sertraline (100mg ), Vit. D, Xarelto, Meclizine, blood pressure med, cholesterol med, Lorazepam (.5 mg as needed).  Goals: Seeking relief to depressive feelings of sadness and lack of motivation/interest in activities. Also, has some anxieties related to health and his own mortality. Goal Date: 06-11-21   [name] sounded different and says he is "very very tired". He seemed confused and content is fragmented. He says he has "been through the ringer" lately with regard to his medical condition/treatment. Treatment was for the anemia and iron. He says there are days that he sleeps most of the day because he does not have any energy. Does feel that the infusions are helping. He is not able to participate in any of his  Tai [employer] classes. He feels that the doctors have taken him "as far as he can go". He adds that he is not  giving up, but feels discouraged.     [name], PhD  Time: 4:10-5:00 50 minutes

## 2021-11-02 ENCOUNTER — Encounter: Payer: Self-pay | Admitting: Family

## 2021-11-02 ENCOUNTER — Inpatient Hospital Stay (HOSPITAL_BASED_OUTPATIENT_CLINIC_OR_DEPARTMENT_OTHER): Payer: No Typology Code available for payment source | Admitting: Family

## 2021-11-02 ENCOUNTER — Other Ambulatory Visit: Payer: Self-pay

## 2021-11-02 ENCOUNTER — Other Ambulatory Visit: Payer: Self-pay | Admitting: Family

## 2021-11-02 ENCOUNTER — Inpatient Hospital Stay: Payer: No Typology Code available for payment source

## 2021-11-02 ENCOUNTER — Telehealth: Payer: Self-pay | Admitting: *Deleted

## 2021-11-02 VITALS — BP 106/69 | HR 70 | Resp 17

## 2021-11-02 VITALS — BP 86/53 | HR 81 | Temp 98.5°F | Resp 16 | Wt 173.0 lb

## 2021-11-02 DIAGNOSIS — D469 Myelodysplastic syndrome, unspecified: Secondary | ICD-10-CM | POA: Diagnosis not present

## 2021-11-02 DIAGNOSIS — R634 Abnormal weight loss: Secondary | ICD-10-CM

## 2021-11-02 DIAGNOSIS — R2232 Localized swelling, mass and lump, left upper limb: Secondary | ICD-10-CM

## 2021-11-02 DIAGNOSIS — D509 Iron deficiency anemia, unspecified: Secondary | ICD-10-CM

## 2021-11-02 DIAGNOSIS — E559 Vitamin D deficiency, unspecified: Secondary | ICD-10-CM

## 2021-11-02 DIAGNOSIS — D649 Anemia, unspecified: Secondary | ICD-10-CM

## 2021-11-02 DIAGNOSIS — C9110 Chronic lymphocytic leukemia of B-cell type not having achieved remission: Secondary | ICD-10-CM

## 2021-11-02 DIAGNOSIS — R5081 Fever presenting with conditions classified elsewhere: Secondary | ICD-10-CM

## 2021-11-02 DIAGNOSIS — D631 Anemia in chronic kidney disease: Secondary | ICD-10-CM

## 2021-11-02 DIAGNOSIS — T451X5A Adverse effect of antineoplastic and immunosuppressive drugs, initial encounter: Secondary | ICD-10-CM

## 2021-11-02 DIAGNOSIS — R112 Nausea with vomiting, unspecified: Secondary | ICD-10-CM

## 2021-11-02 LAB — CMP (CANCER CENTER ONLY)
ALT: 13 U/L (ref 0–44)
AST: 19 U/L (ref 15–41)
Albumin: 3.7 g/dL (ref 3.5–5.0)
Alkaline Phosphatase: 123 U/L (ref 38–126)
Anion gap: 7 (ref 5–15)
BUN: 23 mg/dL (ref 8–23)
CO2: 32 mmol/L (ref 22–32)
Calcium: 12 mg/dL — ABNORMAL HIGH (ref 8.9–10.3)
Chloride: 102 mmol/L (ref 98–111)
Creatinine: 1.68 mg/dL — ABNORMAL HIGH (ref 0.61–1.24)
GFR, Estimated: 42 mL/min — ABNORMAL LOW (ref >60)
Glucose, Bld: 97 mg/dL (ref 70–99)
Potassium: 3.4 mmol/L — ABNORMAL LOW (ref 3.5–5.1)
Sodium: 141 mmol/L (ref 135–145)
Total Bilirubin: 0.7 mg/dL (ref 0.3–1.2)
Total Protein: 6.2 g/dL — ABNORMAL LOW (ref 6.5–8.1)

## 2021-11-02 LAB — CBC WITH DIFFERENTIAL (CANCER CENTER ONLY)
Abs Immature Granulocytes: 0.01 10*3/uL (ref 0.00–0.07)
Basophils Absolute: 0 10*3/uL (ref 0.0–0.1)
Basophils Relative: 0 %
Eosinophils Absolute: 0 10*3/uL (ref 0.0–0.5)
Eosinophils Relative: 0 %
HCT: 32 % — ABNORMAL LOW (ref 39.0–52.0)
Hemoglobin: 10.2 g/dL — ABNORMAL LOW (ref 13.0–17.0)
Immature Granulocytes: 1 %
Lymphocytes Relative: 28 %
Lymphs Abs: 0.3 10*3/uL — ABNORMAL LOW (ref 0.7–4.0)
MCH: 28.8 pg (ref 26.0–34.0)
MCHC: 31.9 g/dL (ref 30.0–36.0)
MCV: 90.4 fL (ref 80.0–100.0)
Monocytes Absolute: 0.2 10*3/uL (ref 0.1–1.0)
Monocytes Relative: 16 %
Neutro Abs: 0.5 10*3/uL — ABNORMAL LOW (ref 1.7–7.7)
Neutrophils Relative %: 55 %
Platelet Count: 134 10*3/uL — ABNORMAL LOW (ref 150–400)
RBC: 3.54 MIL/uL — ABNORMAL LOW (ref 4.22–5.81)
RDW: 15 % (ref 11.5–15.5)
WBC Count: 0.9 10*3/uL — CL (ref 4.0–10.5)
nRBC: 0 % (ref 0.0–0.2)

## 2021-11-02 LAB — RETICULOCYTES
Immature Retic Fract: 9.8 % (ref 2.3–15.9)
RBC.: 3.5 MIL/uL — ABNORMAL LOW (ref 4.22–5.81)
Retic Count, Absolute: 48.3 10*3/uL (ref 19.0–186.0)
Retic Ct Pct: 1.4 % (ref 0.4–3.1)

## 2021-11-02 LAB — IRON AND IRON BINDING CAPACITY (CC-WL,HP ONLY)
Iron: 27 ug/dL — ABNORMAL LOW (ref 45–182)
Saturation Ratios: 11 % — ABNORMAL LOW (ref 17.9–39.5)
TIBC: 248 ug/dL — ABNORMAL LOW (ref 250–450)
UIBC: 221 ug/dL (ref 117–376)

## 2021-11-02 LAB — VITAMIN D 25 HYDROXY (VIT D DEFICIENCY, FRACTURES): Vit D, 25-Hydroxy: 64.64 ng/mL (ref 30–100)

## 2021-11-02 LAB — SAVE SMEAR(SSMR), FOR PROVIDER SLIDE REVIEW

## 2021-11-02 MED ORDER — SODIUM CHLORIDE 0.9 % IV SOLN: Freq: Once | INTRAVENOUS | Status: AC

## 2021-11-02 MED ORDER — SODIUM CHLORIDE 0.9 % IV SOLN
20.0000 mg | Freq: Once | INTRAVENOUS | Status: DC
  Administered 2021-11-02: 11:00:00 20 mg via INTRAVENOUS
  Filled 2021-11-02: qty 20

## 2021-11-02 MED ORDER — DRONABINOL 5 MG PO CAPS: 5.0000 mg | ORAL_CAPSULE | Freq: Two times a day (BID) | ORAL | 1 refills | Status: DC

## 2021-11-02 MED ORDER — PEGFILGRASTIM-BMEZ 6 MG/0.6ML ~~LOC~~ SOSY
6.0000 mg | PREFILLED_SYRINGE | Freq: Once | SUBCUTANEOUS | Status: AC
  Administered 2021-11-02: 13:00:00 6 mg via SUBCUTANEOUS
  Filled 2021-11-02: qty 0.6

## 2021-11-02 NOTE — Telephone Encounter (Signed)
Vale Haven NP notified of ANC-0.9.  No new orders received at this time.

## 2021-11-02 NOTE — Addendum Note (Signed)
Addended by: Burney Gauze R on: 11/02/2021 01:37 PM   Modules accepted: Level of Service

## 2021-11-02 NOTE — Patient Instructions (Signed)

## 2021-11-02 NOTE — Progress Notes (Addendum)
Hematology and Oncology Follow Up Visit  [name] [id number] [birth year] 75 y.o. 11/02/2021   Principle Diagnosis:  Right lower lobe pulmonary nodule- non-malignant Recurrent pulmonary embolism CLL -- progressive -- 11q-/13q- Chronic neutropenia secondary to chemotherapy  Past Therapy: Calquence 100 mg po BID -- start on 03/13/2020 -- d/c on 07/05/2021 Venetoclax 100 mg po q day -- change 07/05/2021 -- d/c on 07/05/2021   Current Therapy:        Coumadin 5 mg po q day Ziextenzo 6 mg SQ as indicated for low WBC count/ANC   Interim History:  [name] is here today for follow-up. He is symptomatic with weakness and fatigue.  He has not appetite and states that he had one episode os nausea and vomiting clear fluid a few days ago. His weight is down 9 lbs since his last visit 3 weeks ago.  He states that he is staying well hydrated throughout the day.  WBC count is 0.9, ANC 0.5, Hgb 10.2, MCV 90 and platelets 134  Epo level was only 9.6. He has significant history of thrombotic event and is on Coumadin managed by cardiology.  He received 3 doses of IV iron over the last 3 weeks for saturation 12%.  Calcium is elevated today at 12.0.  No fever, chills, cough, rash, dizziness, SOB, chest pain, palpitations, abdominal pain or changes in bowel or bladder habits.  He has a palpable enlarged left axillary lymph node.  No lymphedema noted.  No swelling, tenderness, tenderness, numbness or tingling in his extremities.  He does feel off balance at times when ambulating with the weakness.  No falls or syncope to report.   ECOG Performance Status: 2 - Symptomatic, <50% confined to bed  Medications:  Allergies as of 11/02/2021       Reactions   Hydrocodone-acetaminophen Shortness Of Breath   Oxycodone Hcl Shortness Of Breath   Telmisartan-hctz Shortness Of Breath, Palpitations, Other (See Comments)   Dizziness also   Ramipril Other (See Comments)   Unknown reaction   Aspirin  Rash, Other (See Comments)   Confusion, also- Cannot take high doses   Atorvastatin Other (See Comments)   Myalgia   Buspirone Hcl Other (See Comments)   Headaches   Codeine Hives, Nausea And Vomiting, Other (See Comments)   Confusion also   Morphine Sulfate Itching   Paroxetine Other (See Comments)   Paresthesias: R arm, R leg, r side of face   Rabeprazole Other (See Comments)   headache        Medication List        Accurate as of November 02, 2021  9:44 AM. If you have any questions, ask your nurse or doctor.          [employer] Sickness Relief 25 MG Chew Generic drug: Meclizine HCl CHEW 1 TABLET EVERY DAY AS NEEDED FOR DIZZINESS   docusate sodium 100 MG capsule Commonly known as: COLACE Take 100 mg by mouth daily as needed (constipation).   hydrochlorothiazide 12.5 MG tablet Commonly known as: HYDRODIURIL TAKE 1 TABLET BY MOUTH EVERY DAY   ipratropium 0.03 % nasal spray Commonly known as: ATROVENT Place 2 sprays into both nostrils every 12 (twelve) hours. What changed:  when to take this reasons to take this   LORazepam 0.5 MG tablet Commonly known as: ATIVAN Take 1 tablet (0.5 mg total) by mouth at bedtime as needed for anxiety (can take 1/4 or 1/2 tablet  if effective.). What changed:  how much to take reasons  to take this   omeprazole 40 MG capsule Commonly known as: PRILOSEC Take 40 mg by mouth every evening.   rosuvastatin 10 MG tablet Commonly known as: CRESTOR TAKE 1 TABLET BY MOUTH EVERY DAY What changed: when to take this   sertraline 100 MG tablet Commonly known as: ZOLOFT Take 100 mg by mouth every morning.   sodium chloride 0.65 % Soln nasal spray Commonly known as: OCEAN Place 1 spray into both nostrils as needed for congestion.   venetoclax 100 MG tablet Commonly known as: VENCLEXTA Take 1 tablet (100 mg total) by mouth daily. Tablets should be swallowed whole with a meal and a full glass of water.   Vitamin D3 50 MCG (2000  UT) capsule Take 2,000 Units by mouth every morning.   warfarin 5 MG tablet Commonly known as: COUMADIN Take as directed by the anticoagulation clinic. If you are unsure how to take this medication, talk to your nurse or doctor. Original instructions: TAKE AS DIRECTED BY COUMADIN CLINIC        Allergies:  Allergies  Allergen Reactions   Hydrocodone-Acetaminophen Shortness Of Breath   Oxycodone Hcl Shortness Of Breath   Telmisartan-Hctz Shortness Of Breath, Palpitations and Other (See Comments)    Dizziness also   Ramipril Other (See Comments)    Unknown reaction   Aspirin Rash and Other (See Comments)    Confusion, also- Cannot take high doses   Atorvastatin Other (See Comments)    Myalgia    Buspirone Hcl Other (See Comments)    Headaches    Codeine Hives, Nausea And Vomiting and Other (See Comments)    Confusion also   Morphine Sulfate Itching   Paroxetine Other (See Comments)    Paresthesias: R arm, R leg, r side of face   Rabeprazole Other (See Comments)    headache    Past Medical History, Surgical history, Social history, and Family History were reviewed and updated.  Review of Systems: All other 10 point review of systems is negative.   Physical Exam:  vitals were not taken for this visit.   Wt Readings from Last 3 Encounters:  10/11/21 182 lb (82.6 kg)  10/01/21 190 lb (86.2 kg)  09/22/21 188 lb (85.3 kg)    Ocular: Sclerae unicteric, pupils equal, round and reactive to light Ear-nose-throat: Oropharynx clear, dentition fair Lymphatic: Large palpable left axillary lymph node, no cervical or supraclavicular adenopathy Lungs no rales or rhonchi, good excursion bilaterally Heart regular rate and rhythm, no murmur appreciated Abd soft, nontender, positive bowel sounds, no liver or spleen tip palpated on exam, no fluid wave MSK no focal spinal tenderness, no joint edema Neuro: non-focal, well-oriented, appropriate affect Breasts: Deferred   Lab Results   Component Value Date   WBC 2.0 (L) 10/01/2021   HGB 9.7 (L) 10/01/2021   HCT 30.3 (L) 10/01/2021   MCV 94.7 10/01/2021   PLT 183 10/01/2021   Lab Results  Component Value Date   FERRITIN 860 (H) 10/01/2021   IRON 42 10/01/2021   TIBC 340 10/01/2021   UIBC 298 10/01/2021   IRONPCTSAT 12 (L) 10/01/2021   Lab Results  Component Value Date   RETICCTPCT 1.04 05/06/2011   RBC 3.20 (L) 10/01/2021   RETICCTABS 51.79 05/06/2011   Lab Results  Component Value Date   KPAFRELGTCHN 22.8 (H) 07/05/2021   LAMBDASER 13.9 07/05/2021   KAPLAMBRATIO 1.64 07/05/2021   Lab Results  Component Value Date   IGGSERUM 551 (L) 10/01/2021   IGA 69 10/01/2021  IGMSERUM 34 10/01/2021   Lab Results  Component Value Date   TOTALPROTELP 6.1 07/26/2021   ALBUMINELP 3.6 07/26/2021   A1GS 0.3 07/26/2021   A2GS 0.6 07/26/2021   BETS 1.0 07/26/2021   GAMS 0.6 07/26/2021   MSPIKE Not Observed 07/26/2021   SPEI Comment 07/10/2021     Chemistry      Component Value Date/Time   NA 139 10/01/2021 1256   NA 145 07/22/2017 0756   NA 139 06/27/2016 1410   K 4.3 10/01/2021 1256   K 4.3 07/22/2017 0756   K 5.1 06/27/2016 1410   CL 103 10/01/2021 1256   CL 105 07/22/2017 0756   CO2 31 10/01/2021 1256   CO2 30 07/22/2017 0756   CO2 28 06/27/2016 1410   BUN 24 (H) 10/01/2021 1256   BUN 17 07/22/2017 0756   BUN 20.9 06/27/2016 1410   CREATININE 1.51 (H) 10/01/2021 1256   CREATININE 1.6 (H) 07/22/2017 0756   CREATININE 1.6 (H) 06/27/2016 1410      Component Value Date/Time   CALCIUM 10.7 (H) 10/01/2021 1256   CALCIUM 9.6 07/22/2017 0756   CALCIUM 9.6 06/27/2016 1410   ALKPHOS 90 10/01/2021 1256   ALKPHOS 59 07/22/2017 0756   ALKPHOS 62 06/27/2016 1410   AST 24 10/01/2021 1256   AST 18 06/27/2016 1410   ALT 16 10/01/2021 1256   ALT 24 07/22/2017 0756   ALT 24 06/27/2016 1410   BILITOT 0.4 10/01/2021 1256   BILITOT 0.46 06/27/2016 1410       Impression and Plan: Mr. Waterson is a pleasant  74 yo African American gentleman with recurrent CLL and chronic neutropenia secondary to previous chemotherapy.  He is symptomatic as mentioned above concerning for possible Richter's transformation. New palpable left axillary lymph node and elevated calcium.  We will give him fluids, IV steroids and Ziextenzo today.  We will get him set up for CT scans as well as bone marrow and left axillary core lymph node biopsies. Orders are in and scheduling aware.  Vitamin D level pending.  He will try Marinol 5 mg PO 30 minutes before his 2 biggest meals of the day.  Repeat lab work and fluids in 1 week.  Follow-up once we have scan and biopsy results.   Eileen Stanford, NP 12/23/20229:44 AM  ADDENDUM: I saw Mr. Melton Alar with Maralyn Sago.  This something would definitely is wrong.  I suspect that he may be developing a large cell non-Hodgkin's lymphoma.  I worried that there might be the possibility of Richter's transformation.  Even though the CLL is well treated, I suppose he could have this issue.  He definitely  has adenopathy.  There is some slight splenomegaly.  He definitely needs to have a CT scan done.  Also believe he is going need to have a bone marrow biopsy done.  He is quite neutropenic.  Again he has a normal white cell differential for the most part.  Again I worry that there might be marrow involvement by a lymphoma.  He needs to have a biopsy of this left axillary lymph node.  It is quite large.  A core biopsy is weight what we need.  He just looks rough.  I just hate the fact that he is not active.  Activity was his life.  He traveled.  He got a new dog to keep him active.  Again I just suspect that there is something else going on with him and that something else is lymphoma.  We will try to get our test done as quickly as possible.  He will get IV fluids today.  I will give him some Zometa to help with his hypercalcemia.  I will give him some Decadron which will also help with the  hypercalcemia.  We had a long talk with him.  I think he understands what the problem might be.  He will come back in next week just for lab work so we can see how everything looks.  Lattie Haw, MD

## 2021-11-03 LAB — IGG, IGA, IGM
IgA: 73 mg/dL (ref 61–437)
IgG (Immunoglobin G), Serum: 621 mg/dL (ref 603–1613)
IgM (Immunoglobulin M), Srm: 42 mg/dL (ref 15–143)

## 2021-11-06 ENCOUNTER — Other Ambulatory Visit: Payer: Self-pay

## 2021-11-06 ENCOUNTER — Telehealth: Payer: Self-pay

## 2021-11-06 DIAGNOSIS — R112 Nausea with vomiting, unspecified: Secondary | ICD-10-CM

## 2021-11-06 DIAGNOSIS — C9110 Chronic lymphocytic leukemia of B-cell type not having achieved remission: Secondary | ICD-10-CM

## 2021-11-06 DIAGNOSIS — T451X5A Adverse effect of antineoplastic and immunosuppressive drugs, initial encounter: Secondary | ICD-10-CM

## 2021-11-06 DIAGNOSIS — R634 Abnormal weight loss: Secondary | ICD-10-CM

## 2021-11-06 DIAGNOSIS — D469 Myelodysplastic syndrome, unspecified: Secondary | ICD-10-CM

## 2021-11-06 NOTE — Telephone Encounter (Signed)
° °  Pre-operative Risk Assessment    Patient Name: [name]  DOB: [birth year] MRN: [id number]      Request for Surgical Clearance    Procedure:  CT Biopsy   Date of Surgery:  Clearance 11/30/21                                 Surgeon:  TBD Surgeon's Group or Practice Name:  [facility] Phone number:  [phone] Fax number:  unknown    Type of Clearance Requested:   - Pharmacy:  Hold Warfarin (Coumadin) CT is Requesting to hold for 7 days prior to biopsy   Type of Anesthesia:  Local    Additional requests/questions:  Please advise surgeon/provider what medications should be held.  Signed, Ovida Delagarza A   11/06/2021, 2:02 PM

## 2021-11-06 NOTE — Progress Notes (Unsigned)
Patient called stating he was having a hard time getting his marinol from [facility], they are having an issue getting it in. Called pharmacy at [facility] and they are able to get it in for pt. [facility]. Pt aware pharmacy will call him when its ready for pick up

## 2021-11-07 ENCOUNTER — Emergency Department (HOSPITAL_COMMUNITY): Payer: No Typology Code available for payment source

## 2021-11-07 ENCOUNTER — Ambulatory Visit (INDEPENDENT_AMBULATORY_CARE_PROVIDER_SITE_OTHER): Payer: No Typology Code available for payment source | Admitting: *Deleted

## 2021-11-07 ENCOUNTER — Encounter: Payer: Self-pay | Admitting: Hematology & Oncology

## 2021-11-07 ENCOUNTER — Other Ambulatory Visit: Payer: Self-pay

## 2021-11-07 ENCOUNTER — Encounter (HOSPITAL_COMMUNITY): Payer: Self-pay

## 2021-11-07 ENCOUNTER — Emergency Department (HOSPITAL_COMMUNITY)
Admission: EM | Admit: 2021-11-07 | Discharge: 2021-11-07 | Disposition: A | Discharge status: Home / Self Care | Payer: No Typology Code available for payment source | Attending: Emergency Medicine | Admitting: Emergency Medicine

## 2021-11-07 DIAGNOSIS — E860 Dehydration: Secondary | ICD-10-CM | POA: Insufficient documentation

## 2021-11-07 DIAGNOSIS — Z952 Presence of prosthetic heart valve: Secondary | ICD-10-CM | POA: Diagnosis not present

## 2021-11-07 DIAGNOSIS — I480 Paroxysmal atrial fibrillation: Secondary | ICD-10-CM

## 2021-11-07 DIAGNOSIS — I2699 Other pulmonary embolism without acute cor pulmonale: Secondary | ICD-10-CM | POA: Diagnosis not present

## 2021-11-07 DIAGNOSIS — R531 Weakness: Secondary | ICD-10-CM | POA: Diagnosis present

## 2021-11-07 DIAGNOSIS — Z5181 Encounter for therapeutic drug level monitoring: Secondary | ICD-10-CM

## 2021-11-07 DIAGNOSIS — Z7901 Long term (current) use of anticoagulants: Secondary | ICD-10-CM | POA: Diagnosis not present

## 2021-11-07 DIAGNOSIS — I129 Hypertensive chronic kidney disease with stage 1 through stage 4 chronic kidney disease, or unspecified chronic kidney disease: Secondary | ICD-10-CM | POA: Diagnosis not present

## 2021-11-07 DIAGNOSIS — N183 Chronic kidney disease, stage 3 unspecified: Secondary | ICD-10-CM | POA: Insufficient documentation

## 2021-11-07 DIAGNOSIS — Z87891 Personal history of nicotine dependence: Secondary | ICD-10-CM | POA: Diagnosis not present

## 2021-11-07 DIAGNOSIS — Z79899 Other long term (current) drug therapy: Secondary | ICD-10-CM | POA: Diagnosis not present

## 2021-11-07 DIAGNOSIS — Z856 Personal history of leukemia: Secondary | ICD-10-CM | POA: Diagnosis not present

## 2021-11-07 DIAGNOSIS — Z8546 Personal history of malignant neoplasm of prostate: Secondary | ICD-10-CM | POA: Diagnosis not present

## 2021-11-07 LAB — CBC WITH DIFFERENTIAL/PLATELET
Abs Immature Granulocytes: 0.06 10*3/uL (ref 0.00–0.07)
Basophils Absolute: 0.1 10*3/uL (ref 0.0–0.1)
Basophils Relative: 2 %
Eosinophils Absolute: 0 10*3/uL (ref 0.0–0.5)
Eosinophils Relative: 0 %
HCT: 29.7 % — ABNORMAL LOW (ref 39.0–52.0)
Hemoglobin: 9.5 g/dL — ABNORMAL LOW (ref 13.0–17.0)
Immature Granulocytes: 1 %
Lymphocytes Relative: 5 %
Lymphs Abs: 0.2 10*3/uL — ABNORMAL LOW (ref 0.7–4.0)
MCH: 29.4 pg (ref 26.0–34.0)
MCHC: 32 g/dL (ref 30.0–36.0)
MCV: 92 fL (ref 80.0–100.0)
Monocytes Absolute: 0.4 10*3/uL (ref 0.1–1.0)
Monocytes Relative: 7 %
Neutro Abs: 4.1 10*3/uL (ref 1.7–7.7)
Neutrophils Relative %: 85 %
Platelets: 82 10*3/uL — ABNORMAL LOW (ref 150–400)
RBC: 3.23 MIL/uL — ABNORMAL LOW (ref 4.22–5.81)
RDW: 15.5 % (ref 11.5–15.5)
WBC: 4.9 10*3/uL (ref 4.0–10.5)
nRBC: 0 % (ref 0.0–0.2)

## 2021-11-07 LAB — URINALYSIS, ROUTINE W REFLEX MICROSCOPIC
Bacteria, UA: NONE SEEN
Bilirubin Urine: NEGATIVE
Glucose, UA: NEGATIVE mg/dL
Ketones, ur: NEGATIVE mg/dL
Leukocytes,Ua: NEGATIVE
Nitrite: NEGATIVE
Protein, ur: 30 mg/dL — AB
RBC / HPF: >50 RBC/hpf — ABNORMAL HIGH (ref 0–5)
Specific Gravity, Urine: 1.017 (ref 1.005–1.030)
pH: 5 (ref 5.0–8.0)

## 2021-11-07 LAB — COMPREHENSIVE METABOLIC PANEL
ALT: 17 U/L (ref 0–44)
AST: 26 U/L (ref 15–41)
Albumin: 3 g/dL — ABNORMAL LOW (ref 3.5–5.0)
Alkaline Phosphatase: 122 U/L (ref 38–126)
Anion gap: 7 (ref 5–15)
BUN: 25 mg/dL — ABNORMAL HIGH (ref 8–23)
CO2: 30 mmol/L (ref 22–32)
Calcium: 9.9 mg/dL (ref 8.9–10.3)
Chloride: 102 mmol/L (ref 98–111)
Creatinine, Ser: 1.62 mg/dL — ABNORMAL HIGH (ref 0.61–1.24)
GFR, Estimated: 44 mL/min — ABNORMAL LOW (ref >60)
Glucose, Bld: 121 mg/dL — ABNORMAL HIGH (ref 70–99)
Potassium: 3.3 mmol/L — ABNORMAL LOW (ref 3.5–5.1)
Sodium: 139 mmol/L (ref 135–145)
Total Bilirubin: 0.8 mg/dL (ref 0.3–1.2)
Total Protein: 5.6 g/dL — ABNORMAL LOW (ref 6.5–8.1)

## 2021-11-07 LAB — PROTIME-INR
INR: 1.3 — ABNORMAL HIGH (ref 0.8–1.2)
Prothrombin Time: 15.8 seconds — ABNORMAL HIGH (ref 11.4–15.2)

## 2021-11-07 LAB — POCT INR: INR: 1.3 — AB (ref 2.0–3.0)

## 2021-11-07 MED ORDER — SODIUM CHLORIDE 0.9 % IV BOLUS
1000.0000 mL | Freq: Once | INTRAVENOUS | Status: AC
  Administered 2021-11-07: 18:00:00 1000 mL via INTRAVENOUS

## 2021-11-07 NOTE — ED Provider Notes (Signed)
[facility] EMERGENCY DEPARTMENT Provider Note   CSN: [id number] Arrival date & time: 11/07/21  1608     History Chief Complaint  Patient presents with   Weakness    [name] is a 75 y.o. male.   Weakness  Patient is a 75 year old male, he has a known history of chronic lymphocytic leukemia followed by [name], he has a history of pulmonary embolism but is currently on Coumadin, history of aortic valve prosthetic and history of atrial fibrillation.  He reports that he has been trying to drink and eat however it was reported that he may not be eating or drinking enough and had a syncopal event that occurred today.  He is having a hard time getting the Marinol from his pharmacy, evidently he has been on this for appetite, he had an infusion for his myelodysplasia on 23 December and had an infusion for iron deficiency anemia on that same day.  His last visit with oncology was on December 23, he is known to have chronic neutropenia secondary to chemotherapy.  Currently taking   Ziextenzo 6mg  SQ for low white blood cell counts and low ANC.  He was seen to be fatigued and generally weak in the office several days ago, has lost approximately 9 pounds in the previous 3 weeks  When he was found today by paramedics on the ground he was awake and alert but very weak and had a hypotensive blood pressure, they were unable to get IV access on the patient prehospital however his last blood pressure was measured at 681 systolic.  The patient is awake alert and states he feels fine without any complaints including headache chest pain abdominal pain nausea vomiting or diarrhea.  He reports that his knees or family member has been brought into town from [place] to help him to eat and drink  Past Medical History:  Diagnosis Date   Anticoagulants causing adverse effect in therapeutic use    Anxiety    Paxil seemed to cause odd neuro side effects; better on prozac as of 09/2015   Aortic  regurgitation 04/2007 surgery   Resolved with [facility]R [facility]   Cervical spondylosis    ESI by [name] in [place]. 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 (most recent 07/2017)   History of prostate cancer 2003   Rad prost   History of pulmonary embolism 2011; 10/2014   Recurrence off of anticoagulation 10/2014: per hem/onc ([name]) pt needs lifelong anticoagulation (hypercoag w/u neg).   Hyperlipidemia    statin-intolerant, except for crestor low dose.   Hypertension    Migraine syndrome    Mitral regurgitation    PAF (paroxysmal atrial fibrillation) (HCC)    Panic attacks    "           "             "                  "                  "                       "                             "  Pulmonary nodule, right 2015   RLL: resected at [facility] --VATS; Benign RLL nodule (fungal and AFB stains neg).  Plan is for [facility] thoracic surg to do f/u o/v & CT chest 1 yr    Right-sided headache 07/2016   Primary stabbing HA or migraine per neuro (Dr. Tomi Likens).  Gabapentin and topamax not tolerated.  These resolved spontaneously.    Patient Active Problem List   Diagnosis Date Noted   IDA (iron deficiency anemia) 10/08/2021   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/2021   Febrile neutropenia (HCC) 08/19/2021   Aortic atherosclerosis ([place]) 07/25/2021   Pneumonia of right lower lobe due to infectious organism    Neutropenic fever (HCC)    FUO (fever of unknown origin)    Pancytopenia ([place])    Positive blood culture 07/09/2021   Anticoagulants causing adverse effect in therapeutic use 06/22/2021   Encounter for therapeutic drug monitoring 09/21/2019   PTSD (post-traumatic stress disorder) 09/29/2018   BPPV (benign paroxysmal positional vertigo) 09/04/2017   CKD (chronic kidney disease), stage III ([place]) 08/11/2017   Former smoker 08/29/2016   Cervical spondylosis    S/P  Bioprosthetic AVR (aortic valve replacement) in 2011 03/22/2015   History of pulmonary embolism 03/07/2015   Recurrent pulmonary embolism ([place]) 11/09/2014   Lung nodule 11/02/2014   Chronic lymphocytic leukemia ([place]) 11/11/2013   Migraine 10/08/2010   GERD 12/11/2009   MITRAL REGURGITATION 04/07/2009   Insomnia 02/03/2008   Anxiety state 09/06/2007   HLD (hyperlipidemia) 04/20/2007   Essential hypertension 04/20/2007   Atrial fibrillation ([place]) 04/20/2007   PROSTATE CANCER, HX OF 04/20/2007    Past Surgical History:  Procedure Laterality Date   AORTIC VALVE REPLACEMENT  2011   [place]  (bioprosthetic)   CARDIAC CATHETERIZATION  04/2010   Normal coronaries.   Carotid dopplers  08/2015   NORMAL   CATARACT EXTRACTION     L 12/06/09   R 09/14/09   COLONOSCOPY  01/26/13   tic's, o/w normal.  ([place])--recall 10 yrs.   PENILE PROSTHESIS IMPLANT     PROSTATECTOMY  04/2002   for prostate cancer   Pulmonary nodule resection  Fall/winter 2016   [facility]   SHOULDER SURGERY     rotator cuff on both arms    TEE WITHOUT CARDIOVERSION N/A 07/13/2021   Procedure: TRANSESOPHAGEAL ECHOCARDIOGRAM (TEE);  Surgeon: Pixie Casino, MD;  Location[facility] ENDOSCOPY;  Service: Cardiovascular;  Laterality: N/A;   TIBIALIS TENDON TRANSFER / REPAIR Right 03/02/2019   Procedure: RECONSTRUCTION OF RIGHT  ANTERIOR TIBIALIS TENDON;  Surgeon: Erle Crocker, MD;  Location: [facility];  Service: Orthopedics;  Laterality: Right;   TRANSTHORACIC ECHOCARDIOGRAM  09/20/2014; 03/2016; 03/2017   2015-mild LVH, EF 50-55%, wall motion nl, grade I diast dysfxn, prosth aort valve good,transaortic gradients decreased compared to echo 11/2013.   03/2016: EF 55-60%, normal LV function, severe LVH, normal diast fxn, mild increase in AV gradient compared to 09/2014 echo.  2018: EF 55-60%, normal LV fxn, grd II DD, AV stable/gradient's stable.       Family History  Problem Relation Age of Onset   Cancer Sister         uterine   Colon cancer Sister 11   Stroke Mother    Prostate cancer Father    Stroke Sister    Stroke Brother    Heart attack Neg Hx     Social History   Tobacco Use   Smoking status: Former    Packs/day: 1.00    Years: 12.00  Pack years: 12.00    Types: Cigarettes    Start date: 11/28/1968    Quit date: 01/29/1981    Years since quitting: 40.8   Smokeless tobacco: Never   Tobacco comments:    quit 75 yo   Vaping Use   Vaping Use: Never used  Substance Use Topics   Alcohol use: Not Currently    Comment: occasional   Drug use: No    Home Medications Prior to Admission medications   Medication Sig Start Date End Date Taking? Authorizing Provider  Cholecalciferol (VITAMIN D3) 50 MCG (2000 UT) capsule Take 2,000 Units by mouth every morning.   Yes [provider]  [employer] MOTION SICKNESS RELIEF 25 MG CHEW CHEW 1 TABLET EVERY DAY AS NEEDED FOR DIZZINESS Patient taking differently: Chew 1 tablet by mouth daily as needed (motion sickness). 09/13/21  Yes [name], MD  docusate sodium (COLACE) 100 MG capsule Take 100 mg by mouth daily as needed (constipation).   Yes [provider]  dronabinol (MARINOL) 5 MG capsule Take 1 capsule (5 mg total) by mouth 2 (two) times daily before a meal. 11/02/21  Yes Celso Amy, NP  ipratropium (ATROVENT) 0.03 % nasal spray Place 2 sprays into both nostrils every 12 (twelve) hours. Patient taking differently: Place 2 sprays into both nostrils 2 (two) times daily as needed for rhinitis (congestion). 10/04/20  Yes Inda Coke, PA  LORazepam (ATIVAN) 0.5 MG tablet Take 1 tablet (0.5 mg total) by mouth at bedtime as needed for anxiety (can take 1/4 or 1/2 tablet  if effective.). Patient taking differently: Take 0.125-0.5 mg by mouth at bedtime as needed for anxiety. 08/02/21  Yes [name], MD  omeprazole (PRILOSEC) 40 MG capsule Take 40 mg by mouth every evening. 10/18/20  Yes [provider]   rosuvastatin (CRESTOR) 10 MG tablet TAKE 1 TABLET BY MOUTH EVERY DAY Patient taking differently: Take 10 mg by mouth at bedtime. 11/01/20  Yes [name], MD  sertraline (ZOLOFT) 100 MG tablet Take 100 mg by mouth every morning.   Yes [provider]  sodium chloride (OCEAN) 0.65 % SOLN nasal spray Place 1 spray into both nostrils as needed for congestion.   Yes [provider]  warfarin (COUMADIN) 5 MG tablet TAKE AS DIRECTED BY COUMADIN CLINIC 09/17/21  Yes Josue Hector, MD  hydrochlorothiazide (HYDRODIURIL) 12.5 MG tablet TAKE 1 TABLET BY MOUTH EVERY DAY Patient not taking: Reported on 11/07/2021 10/08/21   [name], MD  venetoclax (VENCLEXTA) 100 MG tablet Take 1 tablet (100 mg total) by mouth daily. Tablets should be swallowed whole with a meal and a full glass of water. Patient not taking: Reported on 08/18/2021 07/05/21   Volanda Napoleon, MD    Allergies    Hydrocodone-acetaminophen, Oxycodone hcl, Telmisartan-hctz, Ramipril, Aspirin, Atorvastatin, Buspirone hcl, Codeine, Morphine sulfate, Paroxetine, and Rabeprazole  Review of Systems   Review of Systems  Neurological:  Positive for weakness.  All other systems reviewed and are negative.  Physical Exam Updated Vital Signs BP 119/74    Pulse 69    Resp 19    Ht 1.778 m (5\' 10" )    Wt 78 kg    SpO2 95%    BMI 24.67 kg/m   Physical Exam Vitals and nursing note reviewed.  Constitutional:      General: He is not in acute distress.    Appearance: He is well-developed.  HENT:     Head: Normocephalic and  atraumatic.     Mouth/Throat:     Mouth: Mucous membranes are dry.     Pharynx: No oropharyngeal exudate.  Eyes:     General: No scleral icterus.       Right eye: No discharge.        Left eye: No discharge.     Conjunctiva/sclera: Conjunctivae normal.     Pupils: Pupils are equal, round, and reactive to light.  Neck:     Thyroid: No thyromegaly.     Vascular: No JVD.  Cardiovascular:      Rate and Rhythm: Normal rate and regular rhythm.     Heart sounds: Normal heart sounds. No murmur heard.   No friction rub. No gallop.  Pulmonary:     Effort: Pulmonary effort is normal. No respiratory distress.     Breath sounds: Normal breath sounds. No wheezing or rales.  Abdominal:     General: Bowel sounds are normal. There is no distension.     Palpations: Abdomen is soft. There is no mass.     Tenderness: There is no abdominal tenderness.  Musculoskeletal:        General: No tenderness. Normal range of motion.     Cervical back: Normal range of motion and neck supple.     Right lower leg: No edema.     Left lower leg: No edema.  Lymphadenopathy:     Cervical: No cervical adenopathy.  Skin:    General: Skin is warm and dry.     Findings: No erythema or rash.  Neurological:     General: No focal deficit present.     Mental Status: He is alert.     Coordination: Coordination normal.     Comments: Normal strength in all 4 extremities, bilateral legs and arms with 5 out of 5 strength, no pronator drift, normal finger-nose-finger, cranial nerves III through XII are normal, able to follow commands, had some difficulty sitting up in bed because of generalized weakness  Psychiatric:        Behavior: Behavior normal.    ED Results / Procedures / Treatments   Labs (all labs ordered are listed, but only abnormal results are displayed) Labs Reviewed  PROTIME-INR - Abnormal; Notable for the following components:      Result Value   Prothrombin Time 15.8 (*)    INR 1.3 (*)    All other components within normal limits  CBC WITH DIFFERENTIAL/PLATELET - Abnormal; Notable for the following components:   RBC 3.23 (*)    Hemoglobin 9.5 (*)    HCT 29.7 (*)    Platelets 82 (*)    Lymphs Abs 0.2 (*)    All other components within normal limits  COMPREHENSIVE METABOLIC PANEL - Abnormal; Notable for the following components:   Potassium 3.3 (*)    Glucose, Bld 121 (*)    BUN 25 (*)     Creatinine, Ser 1.62 (*)    Total Protein 5.6 (*)    Albumin 3.0 (*)    GFR, Estimated 44 (*)    All other components within normal limits  URINALYSIS, ROUTINE W REFLEX MICROSCOPIC - Abnormal; Notable for the following components:   APPearance CLOUDY (*)    Hgb urine dipstick SMALL (*)    Protein, ur 30 (*)    RBC / HPF >50 (*)    All other components within normal limits  RESP PANEL BY RT-PCR (FLU A&B, COVID) [id number]    EKG None  Radiology CT Head Wo Contrast  Result Date: 11/07/2021 CLINICAL DATA:  Syncope, dizziness EXAM: CT HEAD WITHOUT CONTRAST TECHNIQUE: Contiguous axial images were obtained from the base of the skull through the vertex without intravenous contrast. COMPARISON:  09/08/2021 FINDINGS: Brain: There are no signs of bleeding within the cranium. Ventricles are unremarkable. Cortical sulci are prominent. There is decreased density in the periventricular white matter. Small old lacunar infarcts are seen in the basal ganglia and periventricular regions. There are scattered calcifications in the basal ganglia. Vascular: There are scattered arterial calcifications. Skull: Unremarkable. Sinuses/Orbits: There is partial opacification of left side of sphenoid sinus. There is mucosal thickening in the ethmoid and maxillary sinuses. Other: No significant interval changes are noted. IMPRESSION: No acute intracranial findings are seen. Atrophy. Small-vessel disease. Small old lacunar infarcts are seen in the basal ganglia and periventricular region. No significant interval changes are noted. Chronic sinusitis. Electronically Signed   By: Elmer Picker M.D.   On: 11/07/2021 18:28    Procedures Procedures   Medications Ordered in ED Medications  sodium chloride 0.9 % bolus 1,000 mL (1,000 mLs Intravenous New Bag/Given 11/07/21 1808)    ED Course  I have reviewed the triage vital signs and the nursing notes.  Pertinent labs & imaging results that were available during my care  of the patient were reviewed by me and considered in my medical decision making (see chart for details).    MDM Rules/Calculators/A&P                          This patient presents to the ED for concern of weakness, hypotension, this involves an extensive number of treatment options, and is a complaint that carries with it a high risk of complications and morbidity.  The differential diagnosis includes suspected dehydration, orthostatic, could be from other cause given progressive cancer.  At this time the patient will need to undergo multiple testing including for urinary infection, COVID, renal function, blood counts, CT scan of the brain, EKG   Additional history obtained:  Additional history obtained from paramedics and the medical record, see HPI External records from outside source obtained and reviewed including see the HPI   Lab Tests:  I Ordered, reviewed, and interpreted labs.  The pertinent results include: CBC metabolic panel urinalysis none of which showed any specific findings other than some signs of dehydration   Imaging Studies ordered:  I ordered imaging studies including CT scan of the brain I independently visualized and interpreted imaging which showed no signs of acute stroke I agree with the radiologist interpretation   Cardiac Monitoring:  The patient was maintained on a cardiac monitor.  I personally viewed and interpreted the cardiac monitored which showed an underlying rhythm of: Normal sinus rhythm, no tachycardia   Medicines ordered and prescription drug management:  I ordered medication including IV fluid for dehydration Reevaluation of the patient after these medicines showed that the patient improved I have reviewed the patients home medicines and have made adjustments as needed   Critical Interventions:  IV fluids for rehydration  Problem List / ED Course:  I suspect the majority the patient's symptoms are related to his dehydration, he  has been given IV fluids and feels much better, vital signs remain normal, he is not tachycardic he is not vomiting he has a family member at the bedside, they are in agreement to follow-up as needed.   Reevaluation:  After the interventions noted above, I reevaluated the patient and found that  they have :improved   Dispostion:  After consideration of the diagnostic results and the patients response to treatment feel that the patent would benefit from discharge.       Final Clinical Impression(s) / ED Diagnoses Final diagnoses:  Dehydration    Rx / DC Orders ED Discharge Orders     None        Noemi Chapel, MD 11/07/21 2228

## 2021-11-07 NOTE — ED Notes (Signed)
This nurse along with a second nurse have attempted an IV x3.

## 2021-11-07 NOTE — Patient Instructions (Signed)
Description   Today and tomorrow take 2 tablets then continue taking warfarin 1.5 tablets daily except for 2 tablets on Sundays. Recheck INR in 1 week. [facility]in Clinic [phone].

## 2021-11-07 NOTE — Discharge Instructions (Addendum)
Your testing was reassuring, your vital signs of been normal, I do want you to continue to take plenty of clear liquids until your urine looks like water  I would like for you to follow-up with your family doctor within 23 to 72 hours if you are not improving but come back to the ER for worsening symptoms

## 2021-11-07 NOTE — ED Notes (Addendum)
Note in error.

## 2021-11-07 NOTE — ED Triage Notes (Signed)
Pt arrived via EMS with complaints of weakness. Pt has hx of leukemia and anemia per niece. Pt stated that he has been back and forth on different chemo meds. EMS states that pt was standing at the window and felt himself get weak and slid to the ground. Per niece pt has not been eating and drinking well.

## 2021-11-07 NOTE — ED Notes (Signed)
Pt given water 

## 2021-11-07 NOTE — Telephone Encounter (Signed)
° °  Name: [name] [facility] DOB: 01-27-1946  MRN: [id number]  Primary Cardiologist: Jenkins Rouge, MD  See notes from our PharmD. Pt requires Lovenox bridge.  Case was reviewed with Dr. [name] who has cleared patient to hold Warfarin for 7 days with Lovenox bridge.   Recommendations: Ok to hold Warfarin (Coumadin) for 7 days prior to surgery. Our office will arrange bridging therapy with Enoxaparin (Lovenox) while the patient is off Warfarin.  Warfarin should be resumed as soon as possible post op when felt to be safe.  Please call with questions. Richardson Dopp, PA-C 11/07/2021, 10:23 AM

## 2021-11-07 NOTE — ED Notes (Signed)
Pt was able to stand up and provide urine sample by urinal. Was able to stand with no assistance.

## 2021-11-07 NOTE — Telephone Encounter (Signed)
Patient with diagnosis of A fib and PE on warfarin for anticoagulation.    Procedure: CT biopsy (bone marrow biopsy and lymph node biopsy) Date of procedure: 11/30/21  CHA2DS2-VASc Score = 4  This indicates a 4.8% annual risk of stroke. The patient's score is based upon:   CrCl 60 mL/min Platelet count 134K  Due to history of PE, A Fib, and current cancer treatments, patient will require Lovenox bridging prior to procedure.    Typically hold warfarin for 5 days with Lovenox bridge.  Oncology requested 7 days. Will need authorization from Dr [name] to hold 7 days.  Will then organize Lovenox bridge for 5 or 7 days.

## 2021-11-09 ENCOUNTER — Inpatient Hospital Stay: Payer: No Typology Code available for payment source

## 2021-11-09 ENCOUNTER — Other Ambulatory Visit: Payer: Self-pay

## 2021-11-09 ENCOUNTER — Encounter (HOSPITAL_COMMUNITY): Payer: Self-pay

## 2021-11-09 ENCOUNTER — Other Ambulatory Visit: Payer: Self-pay | Admitting: Family

## 2021-11-09 VITALS — BP 121/61 | HR 65 | Resp 17

## 2021-11-09 DIAGNOSIS — C9110 Chronic lymphocytic leukemia of B-cell type not having achieved remission: Secondary | ICD-10-CM | POA: Diagnosis not present

## 2021-11-09 DIAGNOSIS — D469 Myelodysplastic syndrome, unspecified: Secondary | ICD-10-CM

## 2021-11-09 DIAGNOSIS — E860 Dehydration: Secondary | ICD-10-CM

## 2021-11-09 LAB — CBC WITH DIFFERENTIAL (CANCER CENTER ONLY)
Abs Immature Granulocytes: 0.02 10*3/uL (ref 0.00–0.07)
Basophils Absolute: 0.1 10*3/uL (ref 0.0–0.1)
Basophils Relative: 1 %
Eosinophils Absolute: 0 10*3/uL (ref 0.0–0.5)
Eosinophils Relative: 1 %
HCT: 30.8 % — ABNORMAL LOW (ref 39.0–52.0)
Hemoglobin: 9.8 g/dL — ABNORMAL LOW (ref 13.0–17.0)
Immature Granulocytes: 1 %
Lymphocytes Relative: 8 %
Lymphs Abs: 0.3 10*3/uL — ABNORMAL LOW (ref 0.7–4.0)
MCH: 29 pg (ref 26.0–34.0)
MCHC: 31.8 g/dL (ref 30.0–36.0)
MCV: 91.1 fL (ref 80.0–100.0)
Monocytes Absolute: 0.2 10*3/uL (ref 0.1–1.0)
Monocytes Relative: 6 %
Neutro Abs: 3.5 10*3/uL (ref 1.7–7.7)
Neutrophils Relative %: 83 %
Platelet Count: 118 10*3/uL — ABNORMAL LOW (ref 150–400)
RBC: 3.38 MIL/uL — ABNORMAL LOW (ref 4.22–5.81)
RDW: 15.4 % (ref 11.5–15.5)
WBC Count: 4.2 10*3/uL (ref 4.0–10.5)
nRBC: 0 % (ref 0.0–0.2)

## 2021-11-09 LAB — LACTATE DEHYDROGENASE: LDH: 399 U/L — ABNORMAL HIGH (ref 98–192)

## 2021-11-09 LAB — CMP (CANCER CENTER ONLY)
ALT: 12 U/L (ref 0–44)
AST: 22 U/L (ref 15–41)
Albumin: 3.5 g/dL (ref 3.5–5.0)
Alkaline Phosphatase: 135 U/L — ABNORMAL HIGH (ref 38–126)
Anion gap: 7 (ref 5–15)
BUN: 17 mg/dL (ref 8–23)
CO2: 31 mmol/L (ref 22–32)
Calcium: 10.4 mg/dL — ABNORMAL HIGH (ref 8.9–10.3)
Chloride: 104 mmol/L (ref 98–111)
Creatinine: 1.23 mg/dL (ref 0.61–1.24)
GFR, Estimated: >60 mL/min (ref >60)
Glucose, Bld: 84 mg/dL (ref 70–99)
Potassium: 3.4 mmol/L — ABNORMAL LOW (ref 3.5–5.1)
Sodium: 142 mmol/L (ref 135–145)
Total Bilirubin: 0.5 mg/dL (ref 0.3–1.2)
Total Protein: 5.7 g/dL — ABNORMAL LOW (ref 6.5–8.1)

## 2021-11-09 LAB — RETICULOCYTES
Immature Retic Fract: 17 % — ABNORMAL HIGH (ref 2.3–15.9)
RBC.: 3.46 MIL/uL — ABNORMAL LOW (ref 4.22–5.81)
Retic Count, Absolute: 45 10*3/uL (ref 19.0–186.0)
Retic Ct Pct: 1.3 % (ref 0.4–3.1)

## 2021-11-09 MED ORDER — SODIUM CHLORIDE 0.9 % IV SOLN
1000.0000 mL | INTRAVENOUS | Status: AC
  Administered 2021-11-09: 11:00:00 1000 mL via INTRAVENOUS

## 2021-11-09 NOTE — Progress Notes (Signed)
[name], [name] Legal Sex  Male DOB  [birth year] SSN  [id number] Address  [place] [address] Phone  [phone] (Home)  [phone] (Mobile) *Preferred*    RE: [name] AXILLARY NODE CORE BIOPSY Received: 6 days ago Sandi Mariscal, MD  Valli Glance Per Annamaria Helling note:   "We will get him set up for CT scans as well as bone marrow and left axillary core lymph node biopsies. Orders are in and scheduling aware."   Given above, please re-submit for Bx approval once staging CT is complete.   (Note, pt will likely be able to be set up as both and US guided Bx and CT Guided BM Bx on the same date.)   Thx,  Ulice Dash        Previous Messages   ----- Message -----  From: Valli Glance  Sent: 11/02/2021   5:04 PM EST  To: Ir Procedure Requests  Subject: [name] AXILLARY NODE CORE BIOPSY                   [name] AXILLARY NODE CORE BIOPSY       Reason: Chronic lymphocytic leukemia, Axillary mass, left

## 2021-11-09 NOTE — Patient Instructions (Signed)

## 2021-11-11 ENCOUNTER — Other Ambulatory Visit: Payer: Self-pay | Admitting: Family Medicine

## 2021-11-11 DIAGNOSIS — E7800 Pure hypercholesterolemia, unspecified: Secondary | ICD-10-CM

## 2021-11-12 ENCOUNTER — Encounter: Payer: Self-pay | Admitting: Hematology & Oncology

## 2021-11-13 ENCOUNTER — Other Ambulatory Visit: Payer: Self-pay | Admitting: Radiology

## 2021-11-14 ENCOUNTER — Other Ambulatory Visit: Payer: Self-pay

## 2021-11-14 ENCOUNTER — Ambulatory Visit (HOSPITAL_COMMUNITY)
Admission: RE | Admit: 2021-11-14 | Discharge: 2021-11-14 | Disposition: A | Discharge status: Home / Self Care | Payer: No Typology Code available for payment source | Source: Ambulatory Visit | Attending: Family | Admitting: Family

## 2021-11-14 ENCOUNTER — Encounter (HOSPITAL_COMMUNITY): Payer: Self-pay

## 2021-11-14 DIAGNOSIS — R2232 Localized swelling, mass and lump, left upper limb: Secondary | ICD-10-CM | POA: Diagnosis present

## 2021-11-14 DIAGNOSIS — D469 Myelodysplastic syndrome, unspecified: Secondary | ICD-10-CM | POA: Diagnosis not present

## 2021-11-14 DIAGNOSIS — C9110 Chronic lymphocytic leukemia of B-cell type not having achieved remission: Secondary | ICD-10-CM

## 2021-11-14 LAB — CBC WITH DIFFERENTIAL/PLATELET
Abs Immature Granulocytes: 0.04 10*3/uL (ref 0.00–0.07)
Basophils Absolute: 0 10*3/uL (ref 0.0–0.1)
Basophils Relative: 0 %
Eosinophils Absolute: 0 10*3/uL (ref 0.0–0.5)
Eosinophils Relative: 1 %
HCT: 31.4 % — ABNORMAL LOW (ref 39.0–52.0)
Hemoglobin: 9.9 g/dL — ABNORMAL LOW (ref 13.0–17.0)
Immature Granulocytes: 1 %
Lymphocytes Relative: 14 %
Lymphs Abs: 0.5 10*3/uL — ABNORMAL LOW (ref 0.7–4.0)
MCH: 29.2 pg (ref 26.0–34.0)
MCHC: 31.5 g/dL (ref 30.0–36.0)
MCV: 92.6 fL (ref 80.0–100.0)
Monocytes Absolute: 0.4 10*3/uL (ref 0.1–1.0)
Monocytes Relative: 12 %
Neutro Abs: 2.7 10*3/uL (ref 1.7–7.7)
Neutrophils Relative %: 72 %
Platelets: 203 10*3/uL (ref 150–400)
RBC: 3.39 MIL/uL — ABNORMAL LOW (ref 4.22–5.81)
RDW: 16.3 % — ABNORMAL HIGH (ref 11.5–15.5)
WBC: 3.7 10*3/uL — ABNORMAL LOW (ref 4.0–10.5)
nRBC: 0 % (ref 0.0–0.2)

## 2021-11-14 MED ORDER — FENTANYL CITRATE (PF) 100 MCG/2ML IJ SOLN
INTRAMUSCULAR | Status: AC | PRN
  Administered 2021-11-14 (×2): 50 ug via INTRAVENOUS

## 2021-11-14 MED ORDER — SODIUM CHLORIDE 0.9 % IV SOLN: INTRAVENOUS | Status: DC

## 2021-11-14 MED ORDER — MIDAZOLAM HCL 2 MG/2ML IJ SOLN
INTRAMUSCULAR | Status: AC
  Filled 2021-11-14: qty 2

## 2021-11-14 MED ORDER — MIDAZOLAM HCL 2 MG/2ML IJ SOLN
INTRAMUSCULAR | Status: AC | PRN
  Administered 2021-11-14 (×2): 1 mg via INTRAVENOUS

## 2021-11-14 MED ORDER — FENTANYL CITRATE (PF) 100 MCG/2ML IJ SOLN
INTRAMUSCULAR | Status: AC
  Filled 2021-11-14: qty 2

## 2021-11-14 MED ORDER — LIDOCAINE HCL (PF) 1 % IJ SOLN
INTRAMUSCULAR | Status: AC | PRN
  Administered 2021-11-14: 20 mL

## 2021-11-14 NOTE — Discharge Instructions (Signed)
Please call Interventional Radiology clinic [phone] with any questions or concerns.  You may remove your dressing and shower tomorrow.   Bone Marrow Aspiration and Bone Marrow Biopsy, Adult, Care After This sheet gives you information about how to care for yourself after your procedure. Your health care provider may also give you more specific instructions. If you have problems or questions, contact your health care provider. What can I expect after the procedure? After the procedure, it is common to have: Mild pain and tenderness. Swelling. Bruising. Follow these instructions at home: Puncture site care Follow instructions from your health care provider about how to take care of the puncture site. Make sure you: Wash your hands with soap and water before and after you change your bandage (dressing). If soap and water are not available, use hand sanitizer. Change your dressing as told by your health care provider. Check your puncture site every day for signs of infection. Check for: More redness, swelling, or pain. Fluid or blood. Warmth. Pus or a bad smell.   Activity Return to your normal activities as told by your health care provider. Ask your health care provider what activities are safe for you. Do not lift anything that is heavier than 10 lb (4.5 kg), or the limit that you are told, until your health care provider says that it is safe. Do not drive for 24 hours if you were given a sedative during your procedure. General instructions Take over-the-counter and prescription medicines only as told by your health care provider. Do not take baths, swim, or use a hot tub until your health care provider approves. Ask your health care provider if you may take showers. You may only be allowed to take sponge baths. If directed, put ice on the affected area. To do this: Put ice in a plastic bag. Place a towel between your skin and the bag. Leave the ice on for 20 minutes, 2-3 times a  day. Keep all follow-up visits as told by your health care provider. This is important.   Contact a health care provider if: Your pain is not controlled with medicine. You have a fever. You have more redness, swelling, or pain around the puncture site. You have fluid or blood coming from the puncture site. Your puncture site feels warm to the touch. You have pus or a bad smell coming from the puncture site. Summary After the procedure, it is common to have mild pain, tenderness, swelling, and bruising. Follow instructions from your health care provider about how to take care of the puncture site and what activities are safe for you. Take over-the-counter and prescription medicines only as told by your health care provider. Contact a health care provider if you have any signs of infection, such as fluid or blood coming from the puncture site. This information is not intended to replace advice given to you by your health care provider. Make sure you discuss any questions you have with your health care provider. Document Revised: 03/16/2019 Document Reviewed: 03/16/2019 Elsevier Patient Education  2021 [employer].   Moderate Conscious Sedation, Adult, Care After This sheet gives you information about how to care for yourself after your procedure. Your health care provider may also give you more specific instructions. If you have problems or questions, contact your health care provider. What can I expect after the procedure? After the procedure, it is common to have: Sleepiness for several hours. Impaired judgment for several hours. Difficulty with balance. Vomiting if you eat too  soon. °Follow these instructions at home: °For the time period you were told by your health care provider: °Rest. °Do not participate in activities where you could fall or become injured. °Do not drive or use machinery. °Do not drink alcohol. °Do not take sleeping pills or medicines that cause drowsiness. °Do not  make important decisions or sign legal documents. °Do not take care of children on your own.  °  °  °Eating and drinking °Follow the diet recommended by your health care provider. °Drink enough fluid to keep your urine pale yellow. °If you vomit: °Drink water, juice, or soup when you can drink without vomiting. °Make sure you have little or no nausea before eating solid foods.   °General instructions °Take over-the-counter and prescription medicines only as told by your health care provider. °Have a responsible adult stay with you for the time you are told. It is important to have someone help care for you until you are awake and alert. °Do not smoke. °Keep all follow-up visits as told by your health care provider. This is important. °Contact a health care provider if: °You are still sleepy or having trouble with balance after 24 hours. °You feel light-headed. °You keep feeling nauseous or you keep vomiting. °You develop a rash. °You have a fever. °You have redness or swelling around the IV site. °Get help right away if: °You have trouble breathing. °You have new-onset confusion at home. °Summary °After the procedure, it is common to feel sleepy, have impaired judgment, or feel nauseous if you eat too soon. °Rest after you get home. Know the things you should not do after the procedure. °Follow the diet recommended by your health care provider and drink enough fluid to keep your urine pale yellow. °Get help right away if you have trouble breathing or new-onset confusion at home. °This information is not intended to replace advice given to you by your health care provider. Make sure you discuss any questions you have with your health care provider. °Document Revised: 02/25/2020 Document Reviewed: 09/23/2019 °Elsevier Patient Education © 2021 Elsevier Inc.  °

## 2021-11-14 NOTE — Procedures (Signed)
Procedure: CT bone marrow biopsy R iliac EBL:   minimal Complications:  none immediate  See full dictation in [facility].  D. Halley Shepheard MD Main # [phone] Pager  [phone] Mobile [phone]     

## 2021-11-14 NOTE — H&P (Addendum)
Referring Physician(s): Ennever,P  Supervising Physician: Arne Cleveland  Patient Status:  [facility] OP  Chief Complaint: "I'm here for a bone marrow biopsy"   Subjective: Patient known to our service from  [facility] biopsy on 06/22/2021.  He has a history of recurrent CLL and chronic neutropenia.  He presents again today for follow-up CT-guided bone marrow biopsy to rule out Richter's transformation.  He currently denies fever, headache, chest pain, dyspnea, cough, abdominal/back pain, nausea, vomiting or bleeding. Additional med hx as below.   Past Medical History:  Diagnosis Date   Anticoagulants causing adverse effect in therapeutic use    Anxiety    Paxil seemed to cause odd neuro side effects; better on prozac as of 09/2015   Aortic regurgitation 04/2007 surgery   [facility]   Cervical spondylosis    ESI by [name] in [place]. [employer] years martial arts training   Chronic lymphocytic leukemia (CLL), B-cell ([place]) approx 2012   stable on f/u's with [name] (most recent 07/2017)   History of prostate cancer 2003   Rad prost   History of pulmonary embolism 2011; 10/2014   Recurrence off of anticoagulation 10/2014: per hem/onc ([name]) pt needs lifelong anticoagulation (hypercoag w/u neg).   Hyperlipidemia    statin-intolerant, except for crestor low dose.   Hypertension    Migraine syndrome    Mitral regurgitation    PAF (paroxysmal atrial fibrillation) (HCC)    Panic attacks    "           "             "                  "                  "                       "                             "                          Pulmonary nodule, right 2015   RLL: resected at [facility] --VATS; Benign RLL nodule (fungal and AFB stains neg).  Plan is for [facility] thoracic surg to do f/u o/v & CT chest 1 yr    Right-sided headache 07/2016   Primary stabbing HA or migraine per neuro (Dr. Tomi Likens).  Gabapentin and topamax not tolerated.  These resolved spontaneously.   Past  Surgical History:  Procedure Laterality Date   AORTIC VALVE REPLACEMENT  2011   [place]  (bioprosthetic)   CARDIAC CATHETERIZATION  04/2010   Normal coronaries.   Carotid dopplers  08/2015   NORMAL   CATARACT EXTRACTION     L 12/06/09   R 09/14/09   COLONOSCOPY  01/26/13   tic's, o/w normal.  ([facility])--recall 10 yrs.   PENILE PROSTHESIS IMPLANT     PROSTATECTOMY  04/2002   for prostate cancer   Pulmonary nodule resection  Fall/winter 2016   [facility]   SHOULDER SURGERY     rotator cuff on both arms    TEE WITHOUT CARDIOVERSION N/A 07/13/2021   Procedure: TRANSESOPHAGEAL ECHOCARDIOGRAM (TEE);  Surgeon: Pixie Casino, MD;  Location: [facility];  Service: Cardiovascular;  Laterality: N/A;   TIBIALIS  TENDON TRANSFER / REPAIR Right 03/02/2019   Procedure: RECONSTRUCTION OF RIGHT  ANTERIOR TIBIALIS TENDON;  Surgeon: Erle Crocker, MD;  Location: [facility];  Service: Orthopedics;  Laterality: Right;   TRANSTHORACIC ECHOCARDIOGRAM  09/20/2014; 03/2016; 03/2017   2015-mild LVH, EF 50-55%, wall motion nl, grade I diast dysfxn, prosth aort valve good,transaortic gradients decreased compared to echo 11/2013.   03/2016: EF 55-60%, normal LV function, severe LVH, normal diast fxn, mild increase in AV gradient compared to 09/2014 echo.  2018: EF 55-60%, normal LV fxn, grd II DD, AV stable/gradient's stable.      Allergies: Hydrocodone-acetaminophen, Oxycodone hcl, Telmisartan-hctz, Ramipril, Aspirin, Atorvastatin, Buspirone hcl, Codeine, Morphine sulfate, Paroxetine, and Rabeprazole  Medications: Prior to Admission medications   Medication Sig Start Date End Date Taking? Authorizing Provider  [employer] MOTION SICKNESS RELIEF 25 MG CHEW CHEW 1 TABLET EVERY DAY AS NEEDED FOR DIZZINESS Patient taking differently: Chew 1 tablet by mouth daily as needed (motion sickness). 09/13/21  Yes [name], MD  docusate sodium (COLACE) 100 MG capsule Take 100 mg by mouth daily as needed  (constipation).   Yes [provider]  dronabinol (MARINOL) 5 MG capsule Take 1 capsule (5 mg total) by mouth 2 (two) times daily before a meal. 11/02/21  Yes Celso Amy, NP  ipratropium (ATROVENT) 0.03 % nasal spray Place 2 sprays into both nostrils every 12 (twelve) hours. Patient taking differently: Place 2 sprays into both nostrils 2 (two) times daily as needed for rhinitis (congestion). 10/04/20  Yes Inda Coke, PA  LORazepam (ATIVAN) 0.5 MG tablet Take 1 tablet (0.5 mg total) by mouth at bedtime as needed for anxiety (can take 1/4 or 1/2 tablet  if effective.). Patient taking differently: Take 0.125-0.5 mg by mouth at bedtime as needed for anxiety. 08/02/21  Yes [name], MD  omeprazole (PRILOSEC) 40 MG capsule Take 40 mg by mouth every evening. 10/18/20  Yes [provider]  rosuvastatin (CRESTOR) 10 MG tablet TAKE 1 TABLET BY MOUTH EVERY DAY 11/13/21  Yes [name], MD  sertraline (ZOLOFT) 100 MG tablet Take 100 mg by mouth every morning.   Yes [provider]  sodium chloride (OCEAN) 0.65 % SOLN nasal spray Place 1 spray into both nostrils as needed for congestion.   Yes [provider]  warfarin (COUMADIN) 5 MG tablet TAKE AS DIRECTED BY COUMADIN CLINIC 09/17/21  Yes Josue Hector, MD  Cholecalciferol (VITAMIN D3) 50 MCG (2000 UT) capsule Take 2,000 Units by mouth every morning.    [provider]  hydrochlorothiazide (HYDRODIURIL) 12.5 MG tablet TAKE 1 TABLET BY MOUTH EVERY DAY Patient not taking: Reported on 11/07/2021 10/08/21   [name], MD  venetoclax (VENCLEXTA) 100 MG tablet Take 1 tablet (100 mg total) by mouth daily. Tablets should be swallowed whole with a meal and a full glass of water. Patient not taking: Reported on 08/18/2021 07/05/21   Volanda Napoleon, MD     Vital Signs: BP 139/75    Pulse 71    Temp 98.1 F (36.7 C) (Oral)    Resp 17    Ht $R'5\' 10"'Zt$  (1.778 m)    Wt 171 lb 15.3 oz (78 kg)    SpO2  99%    BMI 24.67 kg/m   Physical Exam awake, alert.  Chest clear to auscultation bilaterally.  Heart with regular rate and rhythm, positive murmur.  Abdomen soft, positive bowel sounds, nontender.  No lower extremity edema. Also  with hx left axillary LAN  Imaging: No results found.  Labs:  CBC: Recent Labs    11/02/21 0936 11/07/21 1708 11/09/21 0948 11/14/21 0716  WBC 0.9* 4.9 4.2 3.7*  HGB 10.2* 9.5* 9.8* 9.9*  HCT 32.0* 29.7* 30.8* 31.4*  PLT 134* 82* 118* 203    COAGS: Recent Labs    09/08/21 1445 09/10/21 1145 10/08/21 1152 10/24/21 1507 11/07/21 1202 11/07/21 1708  INR 1.4*   < > 2.2 3.9* 1.3* 1.3*  APTT 39*  --   --   --   --   --    < > = values in this interval not displayed.    BMP: Recent Labs    10/01/21 1256 11/02/21 0936 11/07/21 1708 11/09/21 0948  NA 139 141 139 142  K 4.3 3.4* 3.3* 3.4*  CL 103 102 102 104  CO2 31 32 30 31  GLUCOSE 94 97 121* 84  BUN 24* 23 25* 17  CALCIUM 10.7* 12.0* 9.9 10.4*  CREATININE 1.51* 1.68* 1.62* 1.23  GFRNONAA 48* 42* 44* >60    LIVER FUNCTION TESTS: Recent Labs    10/01/21 1256 11/02/21 0936 11/07/21 1708 11/09/21 0948  BILITOT 0.4 0.7 0.8 0.5  AST $Re'24 19 26 22  'Rdh$ ALT $R'16 13 17 12  'lw$ ALKPHOS 90 123 122 135*  PROT 6.1* 6.2* 5.6* 5.7*  ALBUMIN 3.8 3.7 3.0* 3.5    Assessment and Plan: Patient known to our service from  [facility] biopsy on 06/22/2021.  He has a history of recurrent CLL and chronic neutropenia.  He presents again today for follow-up CT-guided bone marrow biopsy to rule out Richter's transformation. Risks and benefits of procedure was discussed with the patient  including, but not limited to bleeding, infection, damage to adjacent structures or low yield requiring additional tests.  All of the questions were answered and there is agreement to proceed.  Consent signed and in chart.    Electronically Signed: D. Rowe Robert, PA-C 11/14/2021, 8:25 AM   I spent a total of 20 minutes at the the  patient's bedside AND on the patient's hospital floor or unit, greater than 50% of which was counseling/coordinating care for CT-guided bone marrow biopsy

## 2021-11-15 ENCOUNTER — Ambulatory Visit (INDEPENDENT_AMBULATORY_CARE_PROVIDER_SITE_OTHER): Payer: No Typology Code available for payment source

## 2021-11-15 DIAGNOSIS — Z952 Presence of prosthetic heart valve: Secondary | ICD-10-CM | POA: Diagnosis not present

## 2021-11-15 DIAGNOSIS — Z5181 Encounter for therapeutic drug level monitoring: Secondary | ICD-10-CM

## 2021-11-15 DIAGNOSIS — I2699 Other pulmonary embolism without acute cor pulmonale: Secondary | ICD-10-CM

## 2021-11-15 DIAGNOSIS — I480 Paroxysmal atrial fibrillation: Secondary | ICD-10-CM | POA: Diagnosis not present

## 2021-11-15 LAB — POCT INR: INR: 2.4 (ref 2.0–3.0)

## 2021-11-15 NOTE — Patient Instructions (Addendum)
°  Description   Take an extra 0.5 tablet today and 2 tablets tomorrow and then continue taking warfarin 1.5 tablets daily except for 2 tablets on Sundays. Recheck INR in 2 weeks. Coumadin Clinic [phone].

## 2021-11-16 ENCOUNTER — Inpatient Hospital Stay: Payer: No Typology Code available for payment source | Attending: Hematology & Oncology

## 2021-11-16 ENCOUNTER — Encounter: Payer: Self-pay | Admitting: Hematology & Oncology

## 2021-11-16 ENCOUNTER — Encounter (HOSPITAL_BASED_OUTPATIENT_CLINIC_OR_DEPARTMENT_OTHER): Payer: Self-pay

## 2021-11-16 ENCOUNTER — Other Ambulatory Visit: Payer: Self-pay

## 2021-11-16 ENCOUNTER — Ambulatory Visit (HOSPITAL_BASED_OUTPATIENT_CLINIC_OR_DEPARTMENT_OTHER)
Admission: RE | Admit: 2021-11-16 | Discharge: 2021-11-16 | Disposition: A | Discharge status: Home / Self Care | Payer: No Typology Code available for payment source | Source: Ambulatory Visit | Attending: Family | Admitting: Family

## 2021-11-16 ENCOUNTER — Ambulatory Visit: Payer: Medicare Other

## 2021-11-16 ENCOUNTER — Inpatient Hospital Stay (HOSPITAL_BASED_OUTPATIENT_CLINIC_OR_DEPARTMENT_OTHER): Payer: No Typology Code available for payment source | Admitting: Hematology & Oncology

## 2021-11-16 VITALS — BP 115/72 | HR 81 | Temp 99.0°F | Resp 18 | Wt 173.0 lb

## 2021-11-16 DIAGNOSIS — T451X5A Adverse effect of antineoplastic and immunosuppressive drugs, initial encounter: Secondary | ICD-10-CM | POA: Insufficient documentation

## 2021-11-16 DIAGNOSIS — Z79899 Other long term (current) drug therapy: Secondary | ICD-10-CM | POA: Insufficient documentation

## 2021-11-16 DIAGNOSIS — D649 Anemia, unspecified: Secondary | ICD-10-CM

## 2021-11-16 DIAGNOSIS — E611 Iron deficiency: Secondary | ICD-10-CM | POA: Insufficient documentation

## 2021-11-16 DIAGNOSIS — C9110 Chronic lymphocytic leukemia of B-cell type not having achieved remission: Secondary | ICD-10-CM | POA: Insufficient documentation

## 2021-11-16 DIAGNOSIS — I2699 Other pulmonary embolism without acute cor pulmonale: Secondary | ICD-10-CM | POA: Diagnosis not present

## 2021-11-16 DIAGNOSIS — D469 Myelodysplastic syndrome, unspecified: Secondary | ICD-10-CM

## 2021-11-16 DIAGNOSIS — D509 Iron deficiency anemia, unspecified: Secondary | ICD-10-CM

## 2021-11-16 DIAGNOSIS — D701 Agranulocytosis secondary to cancer chemotherapy: Secondary | ICD-10-CM | POA: Diagnosis not present

## 2021-11-16 DIAGNOSIS — R911 Solitary pulmonary nodule: Secondary | ICD-10-CM | POA: Diagnosis not present

## 2021-11-16 DIAGNOSIS — Z7901 Long term (current) use of anticoagulants: Secondary | ICD-10-CM | POA: Insufficient documentation

## 2021-11-16 LAB — IRON AND IRON BINDING CAPACITY (CC-WL,HP ONLY)
Iron: 29 ug/dL — ABNORMAL LOW (ref 45–182)
Saturation Ratios: 10 % — ABNORMAL LOW (ref 17.9–39.5)
TIBC: 283 ug/dL (ref 250–450)
UIBC: 254 ug/dL (ref 117–376)

## 2021-11-16 LAB — CBC WITH DIFFERENTIAL (CANCER CENTER ONLY)
Abs Immature Granulocytes: 0.04 10*3/uL (ref 0.00–0.07)
Basophils Absolute: 0 10*3/uL (ref 0.0–0.1)
Basophils Relative: 0 %
Eosinophils Absolute: 0 10*3/uL (ref 0.0–0.5)
Eosinophils Relative: 0 %
HCT: 31.2 % — ABNORMAL LOW (ref 39.0–52.0)
Hemoglobin: 9.9 g/dL — ABNORMAL LOW (ref 13.0–17.0)
Immature Granulocytes: 2 %
Lymphocytes Relative: 13 %
Lymphs Abs: 0.4 10*3/uL — ABNORMAL LOW (ref 0.7–4.0)
MCH: 29.1 pg (ref 26.0–34.0)
MCHC: 31.7 g/dL (ref 30.0–36.0)
MCV: 91.8 fL (ref 80.0–100.0)
Monocytes Absolute: 0.4 10*3/uL (ref 0.1–1.0)
Monocytes Relative: 14 %
Neutro Abs: 2 10*3/uL (ref 1.7–7.7)
Neutrophils Relative %: 71 %
Platelet Count: 181 10*3/uL (ref 150–400)
RBC: 3.4 MIL/uL — ABNORMAL LOW (ref 4.22–5.81)
RDW: 16.3 % — ABNORMAL HIGH (ref 11.5–15.5)
WBC Count: 2.8 10*3/uL — ABNORMAL LOW (ref 4.0–10.5)
nRBC: 0 % (ref 0.0–0.2)

## 2021-11-16 LAB — CMP (CANCER CENTER ONLY)
ALT: 10 U/L (ref 0–44)
AST: 15 U/L (ref 15–41)
Albumin: 4 g/dL (ref 3.5–5.0)
Alkaline Phosphatase: 135 U/L — ABNORMAL HIGH (ref 38–126)
Anion gap: 9 (ref 5–15)
BUN: 15 mg/dL (ref 8–23)
CO2: 29 mmol/L (ref 22–32)
Calcium: 10.2 mg/dL (ref 8.9–10.3)
Chloride: 99 mmol/L (ref 98–111)
Creatinine: 1.49 mg/dL — ABNORMAL HIGH (ref 0.61–1.24)
GFR, Estimated: 49 mL/min — ABNORMAL LOW (ref >60)
Glucose, Bld: 94 mg/dL (ref 70–99)
Potassium: 4.2 mmol/L (ref 3.5–5.1)
Sodium: 137 mmol/L (ref 135–145)
Total Bilirubin: 0.8 mg/dL (ref 0.3–1.2)
Total Protein: 6.2 g/dL — ABNORMAL LOW (ref 6.5–8.1)

## 2021-11-16 LAB — SAMPLE TO BLOOD BANK

## 2021-11-16 LAB — SAVE SMEAR(SSMR), FOR PROVIDER SLIDE REVIEW

## 2021-11-16 LAB — LACTATE DEHYDROGENASE: LDH: 311 U/L — ABNORMAL HIGH (ref 98–192)

## 2021-11-16 LAB — RETICULOCYTES
Immature Retic Fract: 10.9 % (ref 2.3–15.9)
RBC.: 3.41 MIL/uL — ABNORMAL LOW (ref 4.22–5.81)
Retic Count, Absolute: 88 10*3/uL (ref 19.0–186.0)
Retic Ct Pct: 2.6 % (ref 0.4–3.1)

## 2021-11-16 LAB — FERRITIN: Ferritin: 1855 ng/mL — ABNORMAL HIGH (ref 24–336)

## 2021-11-16 MED ORDER — IOHEXOL 300 MG/ML  SOLN
100.0000 mL | Freq: Once | INTRAMUSCULAR | Status: AC | PRN
  Administered 2021-11-16: 100 mL via INTRAVENOUS

## 2021-11-16 NOTE — Progress Notes (Signed)
Hematology and Oncology Follow Up Visit  Stefano Trulson Penafiel [id number] Mar 15, 1946 76 y.o. 11/16/2021   Principle Diagnosis:  Right lower lobe pulmonary nodule- non-malignant Recurrent pulmonary embolism CLL -- progressive -- 11q-/13q- Chronic neutropenia secondary to chemotherapy  Past Therapy: Calquence 100 mg po BID -- start on 03/13/2020 -- d/c on 07/05/2021 Venetoclax 100 mg po q day -- change 07/05/2021 -- d/c on 07/05/2021   Current Therapy:        Coumadin 5 mg po q day Ziextenzo 6 mg SQ as indicated for low WBC count/ANC   Interim History:  Mr. Mahaney is here today for follow-up.  He finally was able to have his bone marrow biopsy done.  This was done a couple days ago.  We do not have the results back yet.  He says that he actually feels better.  I am not sure as to why he would feel better.  He did have his CAT scan done today.  To no surprise, he has diffuse lymphadenopathy.  When we last saw him, I wanted to get a biopsy of his left axillary lymph node.  Again I suspect that he might be transforming to a diffuse large cell lymphoma.  We still not be able to get the lymph node biopsy.  He is on Coumadin.  I am trying to talk with Interventional Radiology to let them know that I do not think he needs to be off Coumadin just to do a core biopsy of one of the lymph nodes.  He says his appetite is doing better.  I am just happy that he feels a little bit better.  He still gets tired quite easily.  He does have iron deficiency.  Today, his iron saturation only 10%.  His ferritin is incredibly high at 1855 which I suspect is probably inflammatory.  His white cell count is actually little bit better.  What I do not understand is why his platelet count is back to normal now.  His platelet counts 181,000.  Again, we really need to get tissue to see exactly what is going on.  I have to believe that he has a large cell lymphoma now.  I am just glad that he is able to be at home.  I am  glad that he is able to be more active.  He is not even close to the lifestyle that he used to have, and that is our goal is to try to get his lifestyle better.     Medications:  Allergies as of 11/16/2021       Reactions   Hydrocodone-acetaminophen Shortness Of Breath   Oxycodone Hcl Shortness Of Breath   Telmisartan-hctz Shortness Of Breath, Palpitations, Other (See Comments)   Dizziness also   Ramipril Other (See Comments)   Unknown reaction   Aspirin Rash, Other (See Comments)   Confusion, also- Cannot take high doses   Atorvastatin Other (See Comments)   Myalgia   Buspirone Hcl Other (See Comments)   Headaches   Codeine Hives, Nausea And Vomiting, Other (See Comments)   Confusion also   Morphine Sulfate Itching   Paroxetine Other (See Comments)   Paresthesias: R arm, R leg, r side of face   Rabeprazole Other (See Comments)   headache        Medication List        Accurate as of November 16, 2021  4:36 PM. If you have any questions, ask your nurse or doctor.  [facility] Sickness Relief 25 MG Chew Generic drug: Meclizine HCl CHEW 1 TABLET EVERY DAY AS NEEDED FOR DIZZINESS What changed: See the new instructions.   docusate sodium 100 MG capsule Commonly known as: COLACE Take 100 mg by mouth daily as needed (constipation).   dronabinol 5 MG capsule Commonly known as: MARINOL Take 1 capsule (5 mg total) by mouth 2 (two) times daily before a meal.   hydrochlorothiazide 12.5 MG tablet Commonly known as: HYDRODIURIL TAKE 1 TABLET BY MOUTH EVERY DAY   ipratropium 0.03 % nasal spray Commonly known as: ATROVENT Place 2 sprays into both nostrils every 12 (twelve) hours. What changed:  when to take this reasons to take this   LORazepam 0.5 MG tablet Commonly known as: ATIVAN Take 1 tablet (0.5 mg total) by mouth at bedtime as needed for anxiety (can take 1/4 or 1/2 tablet  if effective.). What changed:  how much to take reasons to take this    omeprazole 40 MG capsule Commonly known as: PRILOSEC Take 40 mg by mouth every evening.   rosuvastatin 10 MG tablet Commonly known as: CRESTOR TAKE 1 TABLET BY MOUTH EVERY DAY   sertraline 100 MG tablet Commonly known as: ZOLOFT Take 100 mg by mouth every morning.   sodium chloride 0.65 % Soln nasal spray Commonly known as: OCEAN Place 1 spray into both nostrils as needed for congestion.   venetoclax 100 MG tablet Commonly known as: VENCLEXTA Take 1 tablet (100 mg total) by mouth daily. Tablets should be swallowed whole with a meal and a full glass of water.   Vitamin D3 50 MCG (2000 UT) capsule Take 2,000 Units by mouth every morning.   warfarin 5 MG tablet Commonly known as: COUMADIN Take as directed by the anticoagulation clinic. If you are unsure how to take this medication, talk to your nurse or doctor. Original instructions: TAKE AS DIRECTED BY COUMADIN CLINIC        Allergies:  Allergies  Allergen Reactions   Hydrocodone-Acetaminophen Shortness Of Breath   Oxycodone Hcl Shortness Of Breath   Telmisartan-Hctz Shortness Of Breath, Palpitations and Other (See Comments)    Dizziness also   Ramipril Other (See Comments)    Unknown reaction   Aspirin Rash and Other (See Comments)    Confusion, also- Cannot take high doses   Atorvastatin Other (See Comments)    Myalgia    Buspirone Hcl Other (See Comments)    Headaches    Codeine Hives, Nausea And Vomiting and Other (See Comments)    Confusion also   Morphine Sulfate Itching   Paroxetine Other (See Comments)    Paresthesias: R arm, R leg, r side of face   Rabeprazole Other (See Comments)    headache    Past Medical History, Surgical history, Social history, and Family History were reviewed and updated.  Review of Systems: . Review of Systems  Constitutional:  Positive for malaise/fatigue.  HENT: Negative.    Eyes: Negative.   Respiratory:  Positive for shortness of breath.   Cardiovascular:   Positive for palpitations.  Gastrointestinal:  Positive for nausea.  Genitourinary: Negative.   Musculoskeletal: Negative.   Skin: Negative.   Neurological: Negative.   Endo/Heme/Allergies: Negative.   Psychiatric/Behavioral: Negative.     Physical Exam:  weight is 173 lb (78.5 kg). His oral temperature is 99 F (37.2 C). His blood pressure is 115/72 and his pulse is 81. His respiration is 18 and oxygen saturation is 100%.   Wt Readings from  Last 3 Encounters:  11/16/21 173 lb (78.5 kg)  11/14/21 171 lb 15.3 oz (78 kg)  11/07/21 171 lb 15.3 oz (78 kg)    Physical Exam Vitals reviewed.  HENT:     Head: Normocephalic and atraumatic.  Eyes:     Pupils: Pupils are equal, round, and reactive to light.  Cardiovascular:     Rate and Rhythm: Normal rate and regular rhythm.     Heart sounds: Normal heart sounds.  Pulmonary:     Effort: Pulmonary effort is normal.     Breath sounds: Normal breath sounds.  Abdominal:     General: Bowel sounds are normal.     Palpations: Abdomen is soft.  Musculoskeletal:        General: No tenderness or deformity. Normal range of motion.     Cervical back: Normal range of motion.  Lymphadenopathy:     Cervical: No cervical adenopathy.  Skin:    General: Skin is warm and dry.     Findings: No erythema or rash.  Neurological:     Mental Status: He is alert and oriented to person, place, and time.  Psychiatric:        Behavior: Behavior normal.        Thought Content: Thought content normal.        Judgment: Judgment normal.     Lab Results  Component Value Date   WBC 2.8 (L) 11/16/2021   HGB 9.9 (L) 11/16/2021   HCT 31.2 (L) 11/16/2021   MCV 91.8 11/16/2021   PLT 181 11/16/2021   Lab Results  Component Value Date   FERRITIN 1,855 (H) 11/16/2021   IRON 29 (L) 11/16/2021   TIBC 283 11/16/2021   UIBC 254 11/16/2021   IRONPCTSAT 10 (L) 11/16/2021   Lab Results  Component Value Date   RETICCTPCT 2.6 11/16/2021   RBC 3.41 (L)  11/16/2021   RETICCTABS 51.79 05/06/2011   Lab Results  Component Value Date   KPAFRELGTCHN 22.8 (H) 07/05/2021   LAMBDASER 13.9 07/05/2021   KAPLAMBRATIO 1.64 07/05/2021   Lab Results  Component Value Date   IGGSERUM 621 11/02/2021   IGA 73 11/02/2021   IGMSERUM 42 11/02/2021   Lab Results  Component Value Date   TOTALPROTELP 6.1 07/26/2021   ALBUMINELP 3.6 07/26/2021   A1GS 0.3 07/26/2021   A2GS 0.6 07/26/2021   BETS 1.0 07/26/2021   GAMS 0.6 07/26/2021   MSPIKE Not Observed 07/26/2021   SPEI Comment 07/10/2021     Chemistry      Component Value Date/Time   NA 137 11/16/2021 0950   NA 145 07/22/2017 0756   NA 139 06/27/2016 1410   K 4.2 11/16/2021 0950   K 4.3 07/22/2017 0756   K 5.1 06/27/2016 1410   CL 99 11/16/2021 0950   CL 105 07/22/2017 0756   CO2 29 11/16/2021 0950   CO2 30 07/22/2017 0756   CO2 28 06/27/2016 1410   BUN 15 11/16/2021 0950   BUN 17 07/22/2017 0756   BUN 20.9 06/27/2016 1410   CREATININE 1.49 (H) 11/16/2021 0950   CREATININE 1.6 (H) 07/22/2017 0756   CREATININE 1.6 (H) 06/27/2016 1410      Component Value Date/Time   CALCIUM 10.2 11/16/2021 0950   CALCIUM 9.6 07/22/2017 0756   CALCIUM 9.6 06/27/2016 1410   ALKPHOS 135 (H) 11/16/2021 0950   ALKPHOS 59 07/22/2017 0756   ALKPHOS 62 06/27/2016 1410   AST 15 11/16/2021 0950   AST 18 06/27/2016 1410  ALT 10 11/16/2021 0950   ALT 24 07/22/2017 0756   ALT 24 06/27/2016 1410   BILITOT 0.8 11/16/2021 0950   BILITOT 0.46 06/27/2016 1410       Impression and Plan: Mr. Delillo is a pleasant 76 yo African American gentleman with recurrent CLL and chronic neutropenia secondary to previous chemotherapy.   Again, we are awaiting the bone marrow biopsy results.  He needs to have the lymph node biopsied.  Once we have these results back, then we will figure out how we can try to help him.  I have to believe that the platelet count being better might be a good sign.  Again his iron is quite  low.  He will definitely need to have some IV iron.  This might make him feel better.  This might also account for his platelet count to be up a little bit.  He does not need to be transfused right now.  We will have to follow him closely.  We will have to figure out when to get him back to the office.  This will be dictated by his pathology reports.  He is doing everything we have asked him to do.  He is such a strong man.  He has had an inspiration to all of [name].  I does want him to have the quality of life that he deserves.   Volanda Napoleon, MD 1/6/20234:36 PM

## 2021-11-17 LAB — IGG, IGA, IGM
IgA: 71 mg/dL (ref 61–437)
IgG (Immunoglobin G), Serum: 624 mg/dL (ref 603–1613)
IgM (Immunoglobulin M), Srm: 36 mg/dL (ref 15–143)

## 2021-11-19 ENCOUNTER — Other Ambulatory Visit: Payer: Self-pay

## 2021-11-19 ENCOUNTER — Encounter (HOSPITAL_COMMUNITY): Payer: Self-pay

## 2021-11-19 ENCOUNTER — Encounter: Payer: Self-pay | Admitting: Family Medicine

## 2021-11-19 ENCOUNTER — Telehealth: Payer: Self-pay | Admitting: Hematology & Oncology

## 2021-11-19 ENCOUNTER — Ambulatory Visit (INDEPENDENT_AMBULATORY_CARE_PROVIDER_SITE_OTHER): Payer: No Typology Code available for payment source | Admitting: Family Medicine

## 2021-11-19 VITALS — BP 100/68 | HR 76 | Temp 97.6°F | Ht 70.0 in | Wt 173.0 lb

## 2021-11-19 DIAGNOSIS — I4891 Unspecified atrial fibrillation: Secondary | ICD-10-CM

## 2021-11-19 DIAGNOSIS — I2699 Other pulmonary embolism without acute cor pulmonale: Secondary | ICD-10-CM

## 2021-11-19 DIAGNOSIS — C9110 Chronic lymphocytic leukemia of B-cell type not having achieved remission: Secondary | ICD-10-CM | POA: Diagnosis not present

## 2021-11-19 DIAGNOSIS — F411 Generalized anxiety disorder: Secondary | ICD-10-CM

## 2021-11-19 DIAGNOSIS — N183 Chronic kidney disease, stage 3 unspecified: Secondary | ICD-10-CM

## 2021-11-19 DIAGNOSIS — E785 Hyperlipidemia, unspecified: Secondary | ICD-10-CM | POA: Diagnosis not present

## 2021-11-19 DIAGNOSIS — I10 Essential (primary) hypertension: Secondary | ICD-10-CM

## 2021-11-19 DIAGNOSIS — R531 Weakness: Secondary | ICD-10-CM

## 2021-11-19 LAB — SURGICAL PATHOLOGY

## 2021-11-19 NOTE — Progress Notes (Signed)
[name], [name] Swatzell Legal Sex  Male DOB  1946/09/24 SSN  [id number] Address  [place] [address] Phone  [phone] (Home)  [phone] (Mobile) *Preferred*    RE: [name] AXILLARY NODE CORE BIOPSY Received: Today Sandi Mariscal, MD  Valli Glance [place] for US guided Left axillary LN Bx - Chest CT - 1/6 - image 12, series 2.   Sedation per pt request.   Cathren Harsh        Previous Messages   ----- Message -----  From: Valli Glance  Sent: 11/16/2021   7:14 PM EST  To: Sandi Mariscal, MD  Subject: RE: [name] AXILLARY NODE CORE BIOPSY               Pt CT was Final today, Provider Canceled Bone Marrow Biopsy  ----- Message -----  From: Sandi Mariscal, MD  Sent: 11/03/2021   8:59 AM EST  To: Valli Glance  Subject: RE: [name] AXILLARY NODE CORE BIOPSY               Per Annamaria Helling note:   "We will get him set up for CT scans as well as bone marrow and left axillary core lymph node biopsies. Orders are in and scheduling aware."   Given above, please re-submit for Bx approval once staging CT is complete.   (Note, pt will likely be able to be set up as both and US guided Bx and CT Guided BM Bx on the same date.)   Thx,  Ulice Dash    ----- Message -----  From: Valli Glance  Sent: 11/02/2021   5:04 PM EST  To: Ir Procedure Requests  Subject: [name] AXILLARY NODE CORE BIOPSY                   [name] AXILLARY NODE CORE BIOPSY       Reason: Chronic lymphocytic leukemia, Axillary mass, left        History: CT in Computer       Provider: Celso Amy       [place]

## 2021-11-19 NOTE — Telephone Encounter (Signed)
Called patient per 1/9 sch msg - no answer. Left message for patient to call back to schedule appt for iron infusion.

## 2021-11-19 NOTE — Patient Instructions (Addendum)
Lets add b12/cyanocobalamin 1066mcg to see if that would help with strength/weakness  Short term lets trial off of rosuvastatin/crestor 10 mg just to see if that helps with strength at all  We will call you within two weeks about your referral to home health for physical and occupational therapy. If you do not hear within 2 weeks, give [name] a call.   Recommended follow up: Return in about 1 month (around 12/20/2021) for follow up- or sooner if needed. If you have chest pain, shortness of breath, worsening weakness please seek care immediately

## 2021-11-21 ENCOUNTER — Encounter (HOSPITAL_COMMUNITY): Payer: Self-pay | Admitting: Hematology & Oncology

## 2021-11-26 ENCOUNTER — Telehealth: Payer: Self-pay | Admitting: Cardiovascular Disease

## 2021-11-26 ENCOUNTER — Encounter: Payer: Self-pay | Admitting: Hematology & Oncology

## 2021-11-26 NOTE — Telephone Encounter (Signed)
Transfer to coumadin clinic for questions about upcoming appt

## 2021-11-26 NOTE — Telephone Encounter (Signed)
Preoperative team, it looks like Richardson Dopp, PA-C addressed this clearance request on 11/06/2021.  Pharmacy was in the process of Lovenox bridge support.  Please resend clearance note from 11/06/2021 to requesting office.  Thank you.  Jossie Ng. Simar Pothier NP-C    11/26/2021, 12:18 PM [address] Office [phone] Fax [phone]

## 2021-11-26 NOTE — Telephone Encounter (Signed)
I apologize that I did not see the clearance. I will fax the clearance notes from Richardson Dopp, [place].

## 2021-11-26 NOTE — Telephone Encounter (Signed)
Left message for Dr. Vernona Rieger office pt hac contacted our office in preparation for his upcoming procedure. Left message today will need  clearance request to be faxed to our office today [phone]. Once clearance request has been received, I will be sure to forward to pre op pool for review and clearance. Pt is on coumadin and states in his message that he has been told he will need to hold x 5 days prior.

## 2021-11-26 NOTE — Telephone Encounter (Signed)
I will ask for a new clearance to please be faxed to our office [phone] attn pre op team/Deshay Kirstein. I dod not see that a clearance has been addressed. Pt saw [facility] 11/07/22, I do not see any clearance that Richardson Dopp, PAC addressed.

## 2021-11-26 NOTE — Telephone Encounter (Signed)
Patient is scheduled for a [name] AXILLARY NODE BIOPSY LEFT (ordered by oncology) on 01/24 and he is being advised to hold Coumadin 5 days prior. He would like to make sure this is alright. Please advise.

## 2021-11-26 NOTE — Telephone Encounter (Signed)
[name] with [facility] is following up. Voice message from [name] has been received. Per [name], clearance request was initially sent to PharmD on 11/06/21 and picked up by [name] Dopp, PA. Are we able to retrieve this? Please advise. If not, [name] states she can resend the [employer].

## 2021-11-27 ENCOUNTER — Other Ambulatory Visit: Payer: Self-pay

## 2021-11-27 ENCOUNTER — Encounter: Payer: Self-pay | Admitting: Cardiovascular Disease

## 2021-11-27 ENCOUNTER — Ambulatory Visit (INDEPENDENT_AMBULATORY_CARE_PROVIDER_SITE_OTHER): Payer: No Typology Code available for payment source | Admitting: *Deleted

## 2021-11-27 DIAGNOSIS — I2699 Other pulmonary embolism without acute cor pulmonale: Secondary | ICD-10-CM | POA: Diagnosis not present

## 2021-11-27 DIAGNOSIS — Z5181 Encounter for therapeutic drug level monitoring: Secondary | ICD-10-CM

## 2021-11-27 DIAGNOSIS — Z952 Presence of prosthetic heart valve: Secondary | ICD-10-CM | POA: Diagnosis not present

## 2021-11-27 DIAGNOSIS — I480 Paroxysmal atrial fibrillation: Secondary | ICD-10-CM | POA: Diagnosis not present

## 2021-11-27 LAB — POCT INR: INR: 1.8 — AB (ref 2.0–3.0)

## 2021-11-27 MED ORDER — ENOXAPARIN SODIUM 120 MG/0.8ML IJ SOSY: 120.0000 mg | PREFILLED_SYRINGE | INTRAMUSCULAR | 0 refills | Status: DC

## 2021-11-27 NOTE — Patient Instructions (Addendum)
1/16: Last dose of warfarin.  1/17: Inject enoxaparin 120 mg in the fatty abdominal tissue at least 2 inches from the belly button once a day at 9pm.  No warfarin.  1/18: Inject enoxaparin in the fatty tissue at 9 pm. No warfarin.  1/19: Inject enoxaparin in the fatty tissue at 9 pm. No warfarin.  1/20: Inject enoxaparin in the fatty tissue  at 9 pm. No warfarin.  1/21: Inject enoxaparin in the fatty tissue at 9 pm. No warfarin   1/22: Inject enoxaparin in the fatty tissue at 9 pm. No warfarin  1/23: No Lovenox and No warfarin.   1/24: Procedure Day - No enoxaparin - Resume warfarin in the evening or as directed by doctor (take an extra half tablet with usual dose for 2 days then resume normal dose).  1/25: Resume enoxaparin inject in the fatty tissue at 8am and take warfarin  1/26: Inject enoxaparin in the fatty tissue at 8am and take warfarin  1/27: Inject enoxaparin in the fatty tissue at 8am and take warfarin  1/28: Inject enoxaparin in the fatty tissue at 8am and take warfarin  1/29: Inject enoxaparin in the fatty tissue at 8am and take warfarin  1/30: warfarin appt to check INR.  Description   Please follow bridge instructions. Normal dose of warfarin 1.5 tablets daily except for 2 tablets on Sundays. Recheck INR 12/10/2021. Please call coumadin clinic with questions at [phone].

## 2021-11-27 NOTE — Progress Notes (Deleted)
1/16: Last dose of warfarin.  1/17: No warfarin or enoxaparin (Lovenox).  1/18: Inject enoxaparin 120 mg in the fatty abdominal tissue at least 2 inches from the belly button once a day at 9pm.  No warfarin.  1/19: Inject enoxaparin in the fatty tissue at 9 pm. No warfarin.  1/20: Inject enoxaparin in the fatty tissue  at 9 pm. No warfarin.  1/21: Inject enoxaparin in the fatty tissue at 9 pm. No warfarin   1/22: Inject enoxaparin in the fatty tissue at 9 pm. No warfarin  1/23: No Lovenox and No warfarin.   1/24: Procedure Day - No enoxaparin - Resume warfarin in the evening or as directed by doctor (take an extra half tablet with usual dose for 2 days then resume normal dose).  1/25: Resume enoxaparin inject in the fatty tissue at 8am and take warfarin  1/26: Inject enoxaparin in the fatty tissue at 8am and take warfarin  1/27: Inject enoxaparin in the fatty tissue at 8am and take warfarin  1/28: Inject enoxaparin in the fatty tissue at 8am and take warfarin  1/29: Inject enoxaparin in the fatty tissue at 8am and take warfarin  1/30: warfarin appt to check INR.

## 2021-11-27 NOTE — Telephone Encounter (Signed)
Called patient to clarify what he is asking. Patient stated he does not want to be dependant on people to drive him unless it is necessary. Informed patient that it looks like his biopsy will be using sedation and he would need someone to drive him to and from that procedure. Patient verbalized understanding and thank [name] for helping him.

## 2021-11-28 ENCOUNTER — Inpatient Hospital Stay: Payer: No Typology Code available for payment source

## 2021-11-28 ENCOUNTER — Encounter (HOSPITAL_COMMUNITY): Payer: Self-pay

## 2021-11-28 VITALS — BP 125/85 | HR 67 | Temp 97.8°F | Resp 18

## 2021-11-28 DIAGNOSIS — D631 Anemia in chronic kidney disease: Secondary | ICD-10-CM

## 2021-11-28 DIAGNOSIS — C9110 Chronic lymphocytic leukemia of B-cell type not having achieved remission: Secondary | ICD-10-CM | POA: Diagnosis not present

## 2021-11-28 DIAGNOSIS — D509 Iron deficiency anemia, unspecified: Secondary | ICD-10-CM

## 2021-11-28 MED ORDER — SODIUM CHLORIDE 0.9 % IV SOLN
300.0000 mg | Freq: Once | INTRAVENOUS | Status: AC
  Administered 2021-11-28: 300 mg via INTRAVENOUS
  Filled 2021-11-28: qty 15

## 2021-11-28 MED ORDER — SODIUM CHLORIDE 0.9 % IV SOLN: Freq: Once | INTRAVENOUS | Status: AC

## 2021-11-28 NOTE — Patient Instructions (Signed)

## 2021-11-29 ENCOUNTER — Ambulatory Visit (INDEPENDENT_AMBULATORY_CARE_PROVIDER_SITE_OTHER): Payer: Medicare Other | Admitting: Psychology

## 2021-11-29 DIAGNOSIS — F4323 Adjustment disorder with mixed anxiety and depressed mood: Secondary | ICD-10-CM

## 2021-11-29 NOTE — Progress Notes (Signed)
Date: 11/29/2021  Treatment Plan: Diagnosis F99 (Unspecified mental disorder) [n/a]  309.28 (Adjustment disorder with mixed anxiety and depressed mood) [n/a]  Symptoms Depressed or irritable mood. (Status: maintained) -- No Description Entered  Diminished interest in or enjoyment of activities. (Status: maintained) -- No Description Entered  Experiences disturbing and persistent thoughts, images, and/or perceptions of the traumatic event. (Status: maintained) -- No Description Entered  Has been exposed to a traumatic event involving actual or perceived threat of death or serious injury. (Status: maintained) -- No Description Entered  Intentionally avoids thoughts, feelings, or discussions related to the traumatic event. (Status: maintained) -- No Description Entered  Lack of energy. (Status: maintained) -- No Description Entered  Sad affect, social withdrawal, anxiety, loss of interest in activities, and low energy. (Status: maintained) -- No Description Entered  Symptoms present more than one month. (Status: maintained) -- No Description Entered  Medication Status compliance  Safety none  If Suicidal or Homicidal State Action Taken: unspecified  Current Risk: low Medications Lorazepam (Dosage: .5mg )  Meclizine (Dosage: unknown)  Sertrline (Dosage: 100mg )  Xarelto (Dosage: unknown)  Objectives Related Problem: Develop healthy thinking patterns and beliefs about self, others, and the world that lead to the alleviation and help prevent the relapse of depression. Description: Implement mindfulness techniques for relapse prevention. Target Date: 2022-03-08 Frequency: Daily Modality: individual Progress: 50%  Related Problem: Develop healthy thinking patterns and beliefs about self, others, and the world that lead to the alleviation and help prevent the relapse of depression. Description: Learn and implement conflict resolution skills to resolve interpersonal problems. Target Date:  2022-03-08 Frequency: Daily Modality: individual Progress: 60%  Related Problem: Develop healthy thinking patterns and beliefs about self, others, and the world that lead to the alleviation and help prevent the relapse of depression. Description: Learn and implement behavioral strategies to overcome depression. Target Date: 2022-03-08 Frequency: Daily Modality: individual Progress: 50%  Related Problem: Develop healthy thinking patterns and beliefs about self, others, and the world that lead to the alleviation and help prevent the relapse of depression. Description: Describe current and past experiences with depression including their impact on functioning and attempts to resolve it. Target Date: 2022-03-08 Frequency: Daily Modality: individual Progress: 60%  Client Response full compliance  Service Location Location, [address] [place], [place] [address]  Service Code cpt [phone]  Self care activities  Emotion regulation skills  Distress tolerance skill  Validate/empathize  Identified an insight  Facilitate problem solving  Normalize/Reframe  Comments  Dx: F43.21  [name] agreed to having a video session due to the pandemic. He was at home and I was in my home office.  Meds: Sertraline (100mg ), Vit. D, Meclizine, blood pressure med, cholesterol med, Lorazepam (.5 mg as needed).  Goals: Seeking relief to depressive feelings of sadness and lack of motivation/interest in activities. Also, has some anxieties related to health and his own mortality. Goal Date: 06-11-22   [name] says it has been "terribly rough" lately. The niece with whom he is close, came to visit him and stayed 2 weeks. She was concerned about [name] related to his anemia and iron deficiency. In addition, he has vertigo. He got on the floor and could not get up and had to call EMS. They took him to the hospital ER, and he was released. In a subsequent meeting with his doctor, the provider felt that there may be som signs  of dementia. [name] doesn't deny, but it is apparently not significant. [name] lymph nodes under his arm. [name] is concerned that it may be another type of cancer causing the swelling under his arm. His doctor is optimistic, but [name] does not believe him. He says that the pain he now suffers is mostly emotional.       [name], PhD  Time: 1:10-2:00 50 minutes               [name], PhD

## 2021-11-30 ENCOUNTER — Other Ambulatory Visit (HOSPITAL_COMMUNITY): Payer: Medicare Other

## 2021-11-30 ENCOUNTER — Encounter (HOSPITAL_COMMUNITY): Payer: Self-pay | Admitting: Hematology & Oncology

## 2021-12-03 ENCOUNTER — Other Ambulatory Visit: Payer: Self-pay | Admitting: Internal Medicine

## 2021-12-03 ENCOUNTER — Other Ambulatory Visit (HOSPITAL_COMMUNITY): Payer: Self-pay | Admitting: Physician Assistant

## 2021-12-03 ENCOUNTER — Other Ambulatory Visit: Payer: Self-pay | Admitting: Radiology

## 2021-12-04 ENCOUNTER — Ambulatory Visit (HOSPITAL_COMMUNITY)
Admission: RE | Admit: 2021-12-04 | Discharge: 2021-12-04 | Disposition: A | Discharge status: Home / Self Care | Payer: No Typology Code available for payment source | Source: Ambulatory Visit | Attending: Family | Admitting: Family

## 2021-12-04 ENCOUNTER — Other Ambulatory Visit: Payer: Self-pay

## 2021-12-04 DIAGNOSIS — R2232 Localized swelling, mass and lump, left upper limb: Secondary | ICD-10-CM | POA: Insufficient documentation

## 2021-12-04 DIAGNOSIS — C9110 Chronic lymphocytic leukemia of B-cell type not having achieved remission: Secondary | ICD-10-CM | POA: Insufficient documentation

## 2021-12-04 DIAGNOSIS — D469 Myelodysplastic syndrome, unspecified: Secondary | ICD-10-CM | POA: Diagnosis not present

## 2021-12-04 MED ORDER — LIDOCAINE HCL (PF) 1 % IJ SOLN
INTRAMUSCULAR | Status: AC
  Filled 2021-12-04: qty 30

## 2021-12-04 NOTE — Procedures (Signed)
Interventional Radiology Procedure Note  Procedure:   US guided left axillary node biopsy.   Specimen: Mx 18 and 16 g core  Complications: None  Recommendations:  - Ok to shower tomorrow - Do not submerge for 7 days - Routine wound care - ok to restart AC on schedule   Signed,  Dulcy Fanny. Earleen Newport, DO

## 2021-12-05 ENCOUNTER — Inpatient Hospital Stay: Payer: No Typology Code available for payment source

## 2021-12-05 VITALS — BP 137/74 | HR 69 | Temp 98.9°F | Resp 16

## 2021-12-05 DIAGNOSIS — D631 Anemia in chronic kidney disease: Secondary | ICD-10-CM

## 2021-12-05 DIAGNOSIS — C9110 Chronic lymphocytic leukemia of B-cell type not having achieved remission: Secondary | ICD-10-CM | POA: Diagnosis not present

## 2021-12-05 DIAGNOSIS — D509 Iron deficiency anemia, unspecified: Secondary | ICD-10-CM

## 2021-12-05 MED ORDER — SODIUM CHLORIDE 0.9 % IV SOLN: Freq: Once | INTRAVENOUS | Status: AC

## 2021-12-05 MED ORDER — SODIUM CHLORIDE 0.9 % IV SOLN
300.0000 mg | Freq: Once | INTRAVENOUS | Status: AC
  Administered 2021-12-05: 12:00:00 300 mg via INTRAVENOUS
  Filled 2021-12-05: qty 300

## 2021-12-05 NOTE — Patient Instructions (Signed)

## 2021-12-06 ENCOUNTER — Encounter (HOSPITAL_COMMUNITY): Payer: Self-pay | Admitting: Hematology & Oncology

## 2021-12-10 ENCOUNTER — Ambulatory Visit (INDEPENDENT_AMBULATORY_CARE_PROVIDER_SITE_OTHER): Payer: Medicare Other

## 2021-12-10 ENCOUNTER — Other Ambulatory Visit: Payer: Self-pay

## 2021-12-10 DIAGNOSIS — Z5181 Encounter for therapeutic drug level monitoring: Secondary | ICD-10-CM

## 2021-12-10 DIAGNOSIS — I2699 Other pulmonary embolism without acute cor pulmonale: Secondary | ICD-10-CM | POA: Diagnosis not present

## 2021-12-10 DIAGNOSIS — I480 Paroxysmal atrial fibrillation: Secondary | ICD-10-CM

## 2021-12-10 DIAGNOSIS — Z952 Presence of prosthetic heart valve: Secondary | ICD-10-CM

## 2021-12-10 LAB — POCT INR: INR: 1.3 — AB (ref 2.0–3.0)

## 2021-12-10 NOTE — Patient Instructions (Signed)
Description   Restart Lovenox injections. Take an extra 0.5 tablet today, 2 tablets tomorrow, and 2 tablets Wednesday and then resume taking Warfarin 1.5 tablets daily except for 2 tablets on Sundays. Recheck INR in 1 week. Please call coumadin clinic with questions at [phone].

## 2021-12-11 ENCOUNTER — Telehealth: Payer: Self-pay

## 2021-12-11 NOTE — Telephone Encounter (Signed)
Pt called asking how long pathology results come back. Called patient back and informed him they could take up to 2 weeks and that we would call him as soon as Dr.Ennever reviewed them, pt confirmed and denies any other questions or concerns at this time

## 2021-12-13 LAB — SURGICAL PATHOLOGY

## 2021-12-17 ENCOUNTER — Ambulatory Visit (INDEPENDENT_AMBULATORY_CARE_PROVIDER_SITE_OTHER): Payer: Medicare Other | Admitting: *Deleted

## 2021-12-17 ENCOUNTER — Encounter: Payer: Self-pay | Admitting: Hematology & Oncology

## 2021-12-17 ENCOUNTER — Other Ambulatory Visit: Payer: Self-pay

## 2021-12-17 DIAGNOSIS — Z952 Presence of prosthetic heart valve: Secondary | ICD-10-CM

## 2021-12-17 DIAGNOSIS — I480 Paroxysmal atrial fibrillation: Secondary | ICD-10-CM

## 2021-12-17 DIAGNOSIS — Z5181 Encounter for therapeutic drug level monitoring: Secondary | ICD-10-CM

## 2021-12-17 DIAGNOSIS — I2699 Other pulmonary embolism without acute cor pulmonale: Secondary | ICD-10-CM | POA: Diagnosis not present

## 2021-12-17 LAB — POCT INR: INR: 2.3 (ref 2.0–3.0)

## 2021-12-17 NOTE — Patient Instructions (Signed)
Description   Continue Lovenox injections until complete. Today take another 1/2 tablet, then start taking Warfarin 1.5 tablets daily except for 2 tablets on Sundays and Thursdays. Recheck INR in 1 week. Please call coumadin clinic with questions at [phone].

## 2021-12-19 ENCOUNTER — Encounter: Payer: Self-pay | Admitting: Hematology & Oncology

## 2021-12-19 NOTE — Progress Notes (Signed)
Phone [phone] In person visit   Subjective:   [name] is a 76 y.o. year old very pleasant male patient who presents for/with See problem oriented charting Chief Complaint  Patient presents with   Follow-up   Hypertension   Hyperlipidemia   Anxiety   Gastroesophageal Reflux   Dizziness    Pt c/o dizziness while in lobby but states this is normal due to his condition.   This visit occurred during the SARS-CoV-2 public health emergency.  Safety protocols were in place, including screening questions prior to the visit, additional usage of staff PPE, and extensive cleaning of exam room while observing appropriate contact time as indicated for disinfecting solutions.   Past Medical History-  Patient Active Problem List   Diagnosis Date Noted   S/P Bioprosthetic AVR (aortic valve replacement) in 2011 03/22/2015    Priority: High   Recurrent pulmonary embolism ([place]) 11/09/2014    Priority: High   Chronic lymphocytic leukemia ([place]) 11/11/2013    Priority: High   Atrial fibrillation ([place]) 04/20/2007    Priority: High   PROSTATE CANCER, HX OF 04/20/2007    Priority: High   Aortic atherosclerosis ([place]) 07/25/2021    Priority: Medium    PTSD (post-traumatic stress disorder) 09/29/2018    Priority: Medium    BPPV (benign paroxysmal positional vertigo) 09/04/2017    Priority: Medium    CKD (chronic kidney disease), stage III ([place]) 08/11/2017    Priority: Medium    Cervical spondylosis     Priority: Medium    Migraine 10/08/2010    Priority: Medium    Insomnia 02/03/2008    Priority: Medium    Anxiety state 09/06/2007    Priority: Medium    HLD (hyperlipidemia) 04/20/2007    Priority: Medium    Essential hypertension 04/20/2007    Priority: Medium    Former smoker 08/29/2016    Priority: Low   Lung nodule 11/02/2014    Priority: Low   GERD 12/11/2009    Priority: Low   MITRAL REGURGITATION 04/07/2009    Priority: Low   IDA (iron deficiency anemia) 10/08/2021    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/2021   Febrile neutropenia ([place]) 08/19/2021   Pneumonia of right lower lobe due to infectious organism    Neutropenic fever (HCC)    FUO (fever of unknown origin)    Pancytopenia ([place])    Positive blood culture 07/09/2021   Anticoagulants causing adverse effect in therapeutic use 06/22/2021   Encounter for therapeutic drug monitoring 09/21/2019   History of pulmonary embolism 03/07/2015    Medications- reviewed and updated Current Outpatient Medications  Medication Sig Dispense Refill   Cholecalciferol (VITAMIN D3) 50 MCG (2000 UT) capsule Take 2,000 Units by mouth every morning.     [facility] MOTION SICKNESS RELIEF 25 MG CHEW CHEW 1 TABLET EVERY DAY AS NEEDED FOR DIZZINESS (Patient taking differently: Chew 1 tablet by mouth daily as needed (motion sickness).) 32 tablet 2   docusate sodium (COLACE) 100 MG capsule Take 100 mg by mouth daily as needed (constipation).     enoxaparin (LOVENOX) 120 MG/0.8ML injection Inject 0.8 mLs (120 mg total) into the skin daily. 12 mL 0   ipratropium (ATROVENT) 0.03 % nasal spray Place 2 sprays into both nostrils every 12 (twelve) hours. (Patient taking differently: Place 2 sprays into both nostrils 2 (two) times daily as needed for rhinitis (congestion).) 30 mL 12   omeprazole (PRILOSEC) 40 MG capsule Take 40 mg by mouth every evening.  rosuvastatin (CRESTOR) 10 MG tablet TAKE 1 TABLET BY MOUTH EVERY DAY 90 tablet 3   sertraline (ZOLOFT) 100 MG tablet Take 100 mg by mouth every morning.     sodium chloride (OCEAN) 0.65 % SOLN nasal spray Place 1 spray into both nostrils as needed for congestion.     warfarin (COUMADIN) 5 MG tablet TAKE AS DIRECTED BY COUMADIN CLINIC 160 tablet 1   LORazepam (ATIVAN) 0.5 MG tablet Take 1 tablet (0.5 mg total) by mouth at bedtime as needed for anxiety (can take 1/4 or 1/2 tablet  if effective.). 30 tablet 2   No current facility-administered medications for this visit.      Objective:  BP 98/62    Pulse 84    Temp 98.1 F (36.7 C)    Ht 5\' 10"  (1.778 m)    Wt 163 lb (73.9 kg)    SpO2 96%    BMI 23.39 kg/m  Gen: NAD, resting comfortably TM normal CV: RRR no murmurs rubs or gallops Lungs: CTAB no crackles, wheeze, rhonchi  Ext: no edema Skin: warm, dry     Assessment and Plan    #CLL-has regular follow-up with oncology.  Unfortunately dealing with chronic neutropenia related to prior chemotherapy.  Was seen by [name] earlier today and plan is to consider transition to Rituxan/Bendamustine.  Plan is for a visit in a week and bring his knees. -he also received a Neulasta treatment today. -There is concern for autoimmune process contributing to neutropenia -Tends to have balance issues prior to her infusions and cancer treatments -Unfortunately he continues to lose weight-down another 10 pounds in the last month- encouraged increasing caloric intake and protein intake- he is going to look into boost lactose free -I offered palliative care referral last visit and he wanted to hold off- again today opts out -Has had some weakness spells and had mentioned trying off rosuvastatin 5 mg- didn't notice much improvement off of this though. Does seem to get a boost when gets neulasta.  -ongoing dizziness as BP has run lower- he states was doing better for a bit and then worsened again in last few days. Mentioned MRI brain but he opts out.   # Atrial fibrillation-follows with cardiology #Recurrent pulmonary embolism-on chronic Coumadin #Status post bioprosthetic AVR S: Rate controlled with no medication Anticoagulated with Coumadin 5 mg -changed to this by cardiology due to thrombus on AVR -no palpitations or chest pain  A/P: PE controlled with coumadin. A fib doing well with no rate control and coumadin alone. Stable x2- continue current meds   #hypertension S: medication: None  -Robaxin encouraged liberalizing salt intake - Hydrochlorothiazide 12.5 mg in  the past BP Readings from Last 3 Encounters:  12/20/21 98/62  12/20/21 103/74  12/05/21 137/74  A/P: Blood pressure running low despite being off hydrochlorothiazide- continue without medicaiton  #Chronic kidney disease stage III S: GFR is typically in the 40s range - on most recent check significant improvement with cr down to 1.21 and GFR over 60- do wonder if loss of muscle mass contributes but also staying well hydrated -Patient knows to avoid NSAIDs  A/P: stable on check earlier today- continue current meds  #hyperlipidemia S: Medication: Crestor 10 mg daily Lab Results  Component Value Date   CHOL 170 05/18/2021   HDL 35.80 (L) 05/18/2021   LDLCALC 110 (H) 05/12/2020   LDLDIRECT 95.0 05/18/2021   TRIG 238.0 (H) 05/18/2021   CHOLHDL 5 05/18/2021   A/P: Mild poor control on  last check particular with aortic atherosclerosis and LDL goal under 70-with that being said I think minimizing medications and not increasing doses that is ideal given what he is facing from an oncological perspective  # Anxiety/PTSD S:Medication: Sertraline 100 mg every mornin and  Ativan 0.5 mg at bedtime as needed-able to cut in half and use--knows to avoid driving for 8 hours after use-have also discussed fall risk- did have one fall but was with wet floor- knows to avoid Counseling: Continued to work with therapist Dr. Cheryln Manly A/P: overall stable- continue curren tmeds- refill lorazepam as running out   # GERD S:Medication: Prilosec 40 mg every evening - seen ENT in the past who recommended cutting back on coffee and spicy foods A/P: stable control- continue current meds  #HM- defer shingrix for now given conditio   Recommended follow up: Return in about 2 months (around 02/17/2022) for follow up- or sooner if needed. Future Appointments  Date Time Provider [place]  12/24/2021 11:30 AM [facility]  12/27/2021  1:00 PM Oren Binet, PhD  [facility] None  01/11/2022 10:00 AM Josue Hector, MD [facility]  01/21/2022  1:00 PM [facility]  01/24/2022  1:00 PM Oren Binet, PhD [facility] None  02/21/2022  1:00 PM Oren Binet, PhD [facility] None  03/21/2022  1:00 PM Oren Binet, PhD [facility] None  04/18/2022  1:00 PM Oren Binet, PhD [facility] None  05/16/2022  1:00 PM Oren Binet, PhD [facility] None    Lab/Order associations:   ICD-10-CM   1. Essential hypertension  I10     2. Chronic lymphocytic leukemia ([place])  C91.10     3. Atrial fibrillation, unspecified type ([place])  I48.91     4. Recurrent pulmonary embolism (HCC)  I26.99     5. Stage 3 chronic kidney disease, unspecified whether stage 3a or 3b CKD (HCC)  N18.30     6. PTSD (post-traumatic stress disorder)  F43.10     7. Chemotherapy-induced neutropenia (HCC) Chronic D70.1    T45.1X5A     8. Anxiety state  F41.1     9. Hyperlipidemia, unspecified hyperlipidemia type  E78.5       Meds ordered this encounter  Medications   LORazepam (ATIVAN) 0.5 MG tablet    Sig: Take 1 tablet (0.5 mg total) by mouth at bedtime as needed for anxiety (can take 1/4 or 1/2 tablet  if effective.).    Dispense:  30 tablet    Refill:  2   I,Jada Bradford,acting as a scribe for Garret Reddish, MD.,have documented all relevant documentation on the behalf of Garret Reddish, MD,as directed by  Garret Reddish, MD while in the presence of Garret Reddish, MD.  I, Garret Reddish, MD, have reviewed all documentation for this visit. The documentation on 12/20/21 for the exam, diagnosis, procedures, and orders are all accurate and complete.  Return precautions advised.  Garret Reddish, MD

## 2021-12-20 ENCOUNTER — Encounter: Payer: Self-pay | Admitting: Family Medicine

## 2021-12-20 ENCOUNTER — Inpatient Hospital Stay (HOSPITAL_BASED_OUTPATIENT_CLINIC_OR_DEPARTMENT_OTHER): Payer: No Typology Code available for payment source | Admitting: Hematology & Oncology

## 2021-12-20 ENCOUNTER — Inpatient Hospital Stay: Payer: No Typology Code available for payment source | Attending: Hematology & Oncology

## 2021-12-20 ENCOUNTER — Encounter: Payer: Self-pay | Admitting: Hematology & Oncology

## 2021-12-20 ENCOUNTER — Ambulatory Visit (INDEPENDENT_AMBULATORY_CARE_PROVIDER_SITE_OTHER): Payer: No Typology Code available for payment source | Admitting: Family Medicine

## 2021-12-20 ENCOUNTER — Other Ambulatory Visit: Payer: Self-pay

## 2021-12-20 ENCOUNTER — Inpatient Hospital Stay: Payer: No Typology Code available for payment source

## 2021-12-20 VITALS — BP 103/74 | HR 77 | Temp 98.3°F | Resp 18 | Ht 70.0 in | Wt 163.4 lb

## 2021-12-20 VITALS — BP 98/62 | HR 84 | Temp 98.1°F | Ht 70.0 in | Wt 163.0 lb

## 2021-12-20 DIAGNOSIS — Z5189 Encounter for other specified aftercare: Secondary | ICD-10-CM | POA: Insufficient documentation

## 2021-12-20 DIAGNOSIS — C9110 Chronic lymphocytic leukemia of B-cell type not having achieved remission: Secondary | ICD-10-CM

## 2021-12-20 DIAGNOSIS — N183 Chronic kidney disease, stage 3 unspecified: Secondary | ICD-10-CM

## 2021-12-20 DIAGNOSIS — D701 Agranulocytosis secondary to cancer chemotherapy: Secondary | ICD-10-CM | POA: Insufficient documentation

## 2021-12-20 DIAGNOSIS — Z7901 Long term (current) use of anticoagulants: Secondary | ICD-10-CM | POA: Insufficient documentation

## 2021-12-20 DIAGNOSIS — R911 Solitary pulmonary nodule: Secondary | ICD-10-CM | POA: Diagnosis not present

## 2021-12-20 DIAGNOSIS — D709 Neutropenia, unspecified: Secondary | ICD-10-CM

## 2021-12-20 DIAGNOSIS — F4310 Post-traumatic stress disorder, unspecified: Secondary | ICD-10-CM

## 2021-12-20 DIAGNOSIS — D509 Iron deficiency anemia, unspecified: Secondary | ICD-10-CM

## 2021-12-20 DIAGNOSIS — T451X5A Adverse effect of antineoplastic and immunosuppressive drugs, initial encounter: Secondary | ICD-10-CM

## 2021-12-20 DIAGNOSIS — I2699 Other pulmonary embolism without acute cor pulmonale: Secondary | ICD-10-CM | POA: Diagnosis not present

## 2021-12-20 DIAGNOSIS — Z5111 Encounter for antineoplastic chemotherapy: Secondary | ICD-10-CM | POA: Diagnosis present

## 2021-12-20 DIAGNOSIS — I4891 Unspecified atrial fibrillation: Secondary | ICD-10-CM

## 2021-12-20 DIAGNOSIS — Z79899 Other long term (current) drug therapy: Secondary | ICD-10-CM | POA: Diagnosis not present

## 2021-12-20 DIAGNOSIS — I10 Essential (primary) hypertension: Secondary | ICD-10-CM | POA: Diagnosis not present

## 2021-12-20 DIAGNOSIS — E785 Hyperlipidemia, unspecified: Secondary | ICD-10-CM

## 2021-12-20 DIAGNOSIS — D631 Anemia in chronic kidney disease: Secondary | ICD-10-CM

## 2021-12-20 DIAGNOSIS — F411 Generalized anxiety disorder: Secondary | ICD-10-CM

## 2021-12-20 LAB — CBC WITH DIFFERENTIAL (CANCER CENTER ONLY)
Abs Immature Granulocytes: 0.01 10*3/uL (ref 0.00–0.07)
Basophils Absolute: 0 10*3/uL (ref 0.0–0.1)
Basophils Relative: 1 %
Eosinophils Absolute: 0 10*3/uL (ref 0.0–0.5)
Eosinophils Relative: 2 %
HCT: 32.4 % — ABNORMAL LOW (ref 39.0–52.0)
Hemoglobin: 10.3 g/dL — ABNORMAL LOW (ref 13.0–17.0)
Immature Granulocytes: 1 %
Lymphocytes Relative: 44 %
Lymphs Abs: 0.8 10*3/uL (ref 0.7–4.0)
MCH: 29.2 pg (ref 26.0–34.0)
MCHC: 31.8 g/dL (ref 30.0–36.0)
MCV: 91.8 fL (ref 80.0–100.0)
Monocytes Absolute: 0.4 10*3/uL (ref 0.1–1.0)
Monocytes Relative: 19 %
Neutro Abs: 0.6 10*3/uL — ABNORMAL LOW (ref 1.7–7.7)
Neutrophils Relative %: 33 %
Platelet Count: 201 10*3/uL (ref 150–400)
RBC: 3.53 MIL/uL — ABNORMAL LOW (ref 4.22–5.81)
RDW: 17 % — ABNORMAL HIGH (ref 11.5–15.5)
WBC Count: 1.8 10*3/uL — ABNORMAL LOW (ref 4.0–10.5)
nRBC: 0 % (ref 0.0–0.2)

## 2021-12-20 LAB — RETICULOCYTES
Immature Retic Fract: 9 % (ref 2.3–15.9)
RBC.: 3.55 MIL/uL — ABNORMAL LOW (ref 4.22–5.81)
Retic Count, Absolute: 107.9 10*3/uL (ref 19.0–186.0)
Retic Ct Pct: 3 % (ref 0.4–3.1)

## 2021-12-20 LAB — CMP (CANCER CENTER ONLY)
ALT: 22 U/L (ref 0–44)
AST: 25 U/L (ref 15–41)
Albumin: 3.9 g/dL (ref 3.5–5.0)
Alkaline Phosphatase: 94 U/L (ref 38–126)
Anion gap: 7 (ref 5–15)
BUN: 26 mg/dL — ABNORMAL HIGH (ref 8–23)
CO2: 28 mmol/L (ref 22–32)
Calcium: 9.7 mg/dL (ref 8.9–10.3)
Chloride: 105 mmol/L (ref 98–111)
Creatinine: 1.21 mg/dL (ref 0.61–1.24)
GFR, Estimated: >60 mL/min (ref >60)
Glucose, Bld: 87 mg/dL (ref 70–99)
Potassium: 4.5 mmol/L (ref 3.5–5.1)
Sodium: 140 mmol/L (ref 135–145)
Total Bilirubin: 0.4 mg/dL (ref 0.3–1.2)
Total Protein: 6.1 g/dL — ABNORMAL LOW (ref 6.5–8.1)

## 2021-12-20 LAB — IRON AND IRON BINDING CAPACITY (CC-WL,HP ONLY)
Iron: 81 ug/dL (ref 45–182)
Saturation Ratios: 26 % (ref 17.9–39.5)
TIBC: 307 ug/dL (ref 250–450)
UIBC: 226 ug/dL (ref 117–376)

## 2021-12-20 LAB — FERRITIN: Ferritin: 2286 ng/mL — ABNORMAL HIGH (ref 24–336)

## 2021-12-20 LAB — SAMPLE TO BLOOD BANK

## 2021-12-20 LAB — SAVE SMEAR(SSMR), FOR PROVIDER SLIDE REVIEW

## 2021-12-20 LAB — LACTATE DEHYDROGENASE: LDH: 317 U/L — ABNORMAL HIGH (ref 98–192)

## 2021-12-20 MED ORDER — LORAZEPAM 0.5 MG PO TABS: 0.5000 mg | ORAL_TABLET | Freq: Every evening | ORAL | 2 refills | Status: DC | PRN

## 2021-12-20 MED ORDER — PEGFILGRASTIM-BMEZ 6 MG/0.6ML ~~LOC~~ SOSY
6.0000 mg | PREFILLED_SYRINGE | Freq: Once | SUBCUTANEOUS | Status: AC
  Administered 2021-12-20: 6 mg via SUBCUTANEOUS
  Filled 2021-12-20: qty 0.6

## 2021-12-20 NOTE — Patient Instructions (Signed)

## 2021-12-20 NOTE — Progress Notes (Signed)
Hematology and Oncology Follow Up Visit  Drey Shaff Millis [id number] Jan 26, 1946 76 y.o. 12/20/2021   Principle Diagnosis:  Right lower lobe pulmonary nodule- non-malignant Recurrent pulmonary embolism CLL -- progressive -- 11q-/13q- Chronic neutropenia secondary to chemotherapy/CLL  Past Therapy: Calquence 100 mg po BID -- start on 03/13/2020 -- d/c on 07/05/2021 Venetoclax 100 mg po q day -- change 07/05/2021 -- d/c on 07/05/2021   Current Therapy:        Coumadin 5 mg po q day Ziextenzo 6 mg SQ as indicated for low WBC count/ANC   Interim History:  Mr. Nedved is here today for follow-up.  We have done a complete work-up on him.  I really was worried about the fact that he may have transformed to a large cell lymphoma.  Thankfully, that was not the case.  However, I do think that his CLL is clearly a problem right now.  He did have a bone marrow biopsy done.  This was done on 11/14/2021.  The pathology report ([id number]) showed hypercellular marrow with trilineage hematopoiesis and lymphoid aggregates concerning for his CLL/small lymphocytic lymphoma.  We then had a lymph node that was biopsied.  This was done on 12/04/2021.  The pathology report ([id number]) showed fragments of lymph node with atypical histiocytic infiltrate.  There is no monoclonal B cells.  CD20 marks for rare B cells.  Again, I have to believe that it is his leukemia/lymphoma that is coming back and clearly causing problems for him.  The CT scan that we did on him late last year showed adenopathy.  I think that he probably would be a candidate for therapy.  I would consider Rituxan/Bendamustine for therapy.  I think this would be very reasonable.  His white cell count is still trending down.  Again, the bone marrow does show that he had good white cell production.  Everything looked okay without any dysplastic changes.  Of note, we did do cytogenetics and FISH studies.  Everything was unremarkable.  He has had no  fever.  He does feel tired.  He has had no rashes.  He still is on Coumadin for the mechanical heart valve.  He has had no problems with rashes.  He has had no leg swelling.  Currently, his performance status is ECOG 1.    Medications:  Allergies as of 12/20/2021       Reactions   Hydrocodone-acetaminophen Shortness Of Breath   Oxycodone Hcl Shortness Of Breath   Telmisartan-hctz Shortness Of Breath, Palpitations, Other (See Comments)   Dizziness also   Aspirin Rash, Other (See Comments)   Confusion, also- Cannot take high doses   Atorvastatin Other (See Comments)   Myalgia   Buspirone Hcl Other (See Comments)   Headaches   Codeine Hives, Nausea And Vomiting, Other (See Comments)   Confusion also   Morphine Sulfate Itching   Paroxetine Other (See Comments)   Paresthesias: R arm, R leg, r side of face   Rabeprazole Other (See Comments)   headache   Ramipril Other (See Comments)   Unknown reaction        Medication List        Accurate as of December 20, 2021  1:25 PM. If you have any questions, ask your nurse or doctor.          [facility]S Motion Sickness Relief 25 MG Chew Generic drug: Meclizine HCl CHEW 1 TABLET EVERY DAY AS NEEDED FOR DIZZINESS What changed: See the new instructions.   docusate sodium 100  MG capsule Commonly known as: COLACE Take 100 mg by mouth daily as needed (constipation).   enoxaparin 120 MG/0.8ML injection Commonly known as: LOVENOX Inject 0.8 mLs (120 mg total) into the skin daily.   ipratropium 0.03 % nasal spray Commonly known as: ATROVENT Place 2 sprays into both nostrils every 12 (twelve) hours. What changed:  when to take this reasons to take this   LORazepam 0.5 MG tablet Commonly known as: ATIVAN Take 1 tablet (0.5 mg total) by mouth at bedtime as needed for anxiety (can take 1/4 or 1/2 tablet  if effective.). What changed:  how much to take reasons to take this   omeprazole 40 MG capsule Commonly known as: PRILOSEC Take  40 mg by mouth every evening.   rosuvastatin 10 MG tablet Commonly known as: CRESTOR TAKE 1 TABLET BY MOUTH EVERY DAY   sertraline 100 MG tablet Commonly known as: ZOLOFT Take 100 mg by mouth every morning.   sodium chloride 0.65 % Soln nasal spray Commonly known as: OCEAN Place 1 spray into both nostrils as needed for congestion.   Vitamin D3 50 MCG (2000 UT) capsule Take 2,000 Units by mouth every morning.   warfarin 5 MG tablet Commonly known as: COUMADIN Take as directed by the anticoagulation clinic. If you are unsure how to take this medication, talk to your nurse or doctor. Original instructions: TAKE AS DIRECTED BY COUMADIN CLINIC        Allergies:  Allergies  Allergen Reactions   Hydrocodone-Acetaminophen Shortness Of Breath   Oxycodone Hcl Shortness Of Breath   Telmisartan-Hctz Shortness Of Breath, Palpitations and Other (See Comments)    Dizziness also   Aspirin Rash and Other (See Comments)    Confusion, also- Cannot take high doses   Atorvastatin Other (See Comments)    Myalgia    Buspirone Hcl Other (See Comments)    Headaches    Codeine Hives, Nausea And Vomiting and Other (See Comments)    Confusion also   Morphine Sulfate Itching   Paroxetine Other (See Comments)    Paresthesias: R arm, R leg, r side of face   Rabeprazole Other (See Comments)    headache   Ramipril Other (See Comments)    Unknown reaction    Past Medical History, Surgical history, Social history, and Family History were reviewed and updated.  Review of Systems: . Review of Systems  Constitutional:  Positive for malaise/fatigue.  HENT: Negative.    Eyes: Negative.   Respiratory:  Positive for shortness of breath.   Cardiovascular:  Positive for palpitations.  Gastrointestinal:  Positive for nausea.  Genitourinary: Negative.   Musculoskeletal: Negative.   Skin: Negative.   Neurological: Negative.   Endo/Heme/Allergies: Negative.   Psychiatric/Behavioral: Negative.      Physical Exam:  height is $RemoveB'5\' 10"'AsWQlUGm$  (1.778 m) and weight is 163 lb 6.4 oz (74.1 kg). His oral temperature is 98.3 F (36.8 C). His blood pressure is 103/74 and his pulse is 77. His respiration is 18 and oxygen saturation is 100%.   Wt Readings from Last 3 Encounters:  12/20/21 163 lb 6.4 oz (74.1 kg)  11/19/21 173 lb (78.5 kg)  11/16/21 173 lb (78.5 kg)    Physical Exam Vitals reviewed.  HENT:     Head: Normocephalic and atraumatic.  Eyes:     Pupils: Pupils are equal, round, and reactive to light.  Cardiovascular:     Rate and Rhythm: Normal rate and regular rhythm.     Heart sounds: Normal heart  sounds.  Pulmonary:     Effort: Pulmonary effort is normal.     Breath sounds: Normal breath sounds.  Abdominal:     General: Bowel sounds are normal.     Palpations: Abdomen is soft.  Musculoskeletal:        General: No tenderness or deformity. Normal range of motion.     Cervical back: Normal range of motion.  Lymphadenopathy:     Cervical: No cervical adenopathy.  Skin:    General: Skin is warm and dry.     Findings: No erythema or rash.  Neurological:     Mental Status: He is alert and oriented to person, place, and time.  Psychiatric:        Behavior: Behavior normal.        Thought Content: Thought content normal.        Judgment: Judgment normal.     Lab Results  Component Value Date   WBC 1.8 (L) 12/20/2021   HGB 10.3 (L) 12/20/2021   HCT 32.4 (L) 12/20/2021   MCV 91.8 12/20/2021   PLT 201 12/20/2021   Lab Results  Component Value Date   FERRITIN 1,855 (H) 11/16/2021   IRON 81 12/20/2021   TIBC 307 12/20/2021   UIBC 226 12/20/2021   IRONPCTSAT 26 12/20/2021   Lab Results  Component Value Date   RETICCTPCT 3.0 12/20/2021   RBC 3.55 (L) 12/20/2021   RETICCTABS 51.79 05/06/2011   Lab Results  Component Value Date   KPAFRELGTCHN 22.8 (H) 07/05/2021   LAMBDASER 13.9 07/05/2021   KAPLAMBRATIO 1.64 07/05/2021   Lab Results  Component Value Date    IGGSERUM 624 11/16/2021   IGA 71 11/16/2021   IGMSERUM 36 11/16/2021   Lab Results  Component Value Date   TOTALPROTELP 6.1 07/26/2021   ALBUMINELP 3.6 07/26/2021   A1GS 0.3 07/26/2021   A2GS 0.6 07/26/2021   BETS 1.0 07/26/2021   GAMS 0.6 07/26/2021   MSPIKE Not Observed 07/26/2021   SPEI Comment 07/10/2021     Chemistry      Component Value Date/Time   NA 140 12/20/2021 1019   NA 145 07/22/2017 0756   NA 139 06/27/2016 1410   K 4.5 12/20/2021 1019   K 4.3 07/22/2017 0756   K 5.1 06/27/2016 1410   CL 105 12/20/2021 1019   CL 105 07/22/2017 0756   CO2 28 12/20/2021 1019   CO2 30 07/22/2017 0756   CO2 28 06/27/2016 1410   BUN 26 (H) 12/20/2021 1019   BUN 17 07/22/2017 0756   BUN 20.9 06/27/2016 1410   CREATININE 1.21 12/20/2021 1019   CREATININE 1.6 (H) 07/22/2017 0756   CREATININE 1.6 (H) 06/27/2016 1410      Component Value Date/Time   CALCIUM 9.7 12/20/2021 1019   CALCIUM 9.6 07/22/2017 0756   CALCIUM 9.6 06/27/2016 1410   ALKPHOS 94 12/20/2021 1019   ALKPHOS 59 07/22/2017 0756   ALKPHOS 62 06/27/2016 1410   AST 25 12/20/2021 1019   AST 18 06/27/2016 1410   ALT 22 12/20/2021 1019   ALT 24 07/22/2017 0756   ALT 24 06/27/2016 1410   BILITOT 0.4 12/20/2021 1019   BILITOT 0.46 06/27/2016 1410       Impression and Plan: Mr. Abdallah is a pleasant 76 yo African American gentleman with recurrent CLL and chronic neutropenia secondary to previous chemotherapy.   I do think that it would be very reasonable to treat him with Rituxan/Bendamustine.  I realize that his white cell count  is quite low.  We will give him some Neulasta today.  I think that he has some kind of autoimmune process going on for the white blood cells.  I do not think that this is still the oral protocol that he was taking for the CLL.  He would like to think about any treatment.  He is going to talk to his niece who has a intensive medical background.  I will have him come back next week.  I  think his niece will be with him.  We can then decide what would be reasonable for him.   Volanda Napoleon, MD 2/9/20231:25 PM

## 2021-12-20 NOTE — Addendum Note (Signed)
Addended by: Burney Gauze R on: 12/20/2021 01:34 PM   Modules accepted: Orders

## 2021-12-20 NOTE — Patient Instructions (Addendum)
Lets try boost/ensure lactose free  Recommended follow up: Return in about 2 months (around 02/17/2022) for follow up- or sooner if needed. -we will try to cut down on frequency of visits for you since that has been a lot for you

## 2021-12-21 ENCOUNTER — Telehealth: Payer: Self-pay | Admitting: Hematology & Oncology

## 2021-12-21 LAB — KAPPA/LAMBDA LIGHT CHAINS
Kappa free light chain: 23 mg/L — ABNORMAL HIGH (ref 3.3–19.4)
Kappa, lambda light chain ratio: 1.93 — ABNORMAL HIGH (ref 0.26–1.65)
Lambda free light chains: 11.9 mg/L (ref 5.7–26.3)

## 2021-12-21 LAB — IGG, IGA, IGM
IgA: 67 mg/dL (ref 61–437)
IgG (Immunoglobin G), Serum: 650 mg/dL (ref 603–1613)
IgM (Immunoglobulin M), Srm: 34 mg/dL (ref 15–143)

## 2021-12-21 LAB — BETA 2 MICROGLOBULIN, SERUM: Beta-2 Microglobulin: 4 mg/L — ABNORMAL HIGH (ref 0.6–2.4)

## 2021-12-21 NOTE — Telephone Encounter (Signed)
Called to schedule per 2/9 los , left voicemail for patient to call us back to schedule

## 2021-12-24 ENCOUNTER — Other Ambulatory Visit: Payer: Self-pay

## 2021-12-24 ENCOUNTER — Ambulatory Visit (INDEPENDENT_AMBULATORY_CARE_PROVIDER_SITE_OTHER): Payer: No Typology Code available for payment source

## 2021-12-24 DIAGNOSIS — Z5181 Encounter for therapeutic drug level monitoring: Secondary | ICD-10-CM

## 2021-12-24 DIAGNOSIS — I4891 Unspecified atrial fibrillation: Secondary | ICD-10-CM | POA: Diagnosis not present

## 2021-12-24 LAB — POCT INR: INR: 2.1 (ref 2.0–3.0)

## 2021-12-24 NOTE — Patient Instructions (Signed)
Description   Today take another 1/2 tablet, then START taking Warfarin 1.5 tablets daily except for 2 tablets on Sundays, Tuesdays, and Thursdays. Recheck INR in 1 week. Please call coumadin clinic with questions at [phone].

## 2021-12-26 ENCOUNTER — Inpatient Hospital Stay: Payer: No Typology Code available for payment source

## 2021-12-26 ENCOUNTER — Other Ambulatory Visit: Payer: Self-pay

## 2021-12-26 ENCOUNTER — Inpatient Hospital Stay (HOSPITAL_BASED_OUTPATIENT_CLINIC_OR_DEPARTMENT_OTHER): Payer: No Typology Code available for payment source | Admitting: Hematology & Oncology

## 2021-12-26 ENCOUNTER — Encounter: Payer: Self-pay | Admitting: Hematology & Oncology

## 2021-12-26 VITALS — BP 104/67 | HR 100 | Temp 99.8°F | Resp 17 | Wt 165.1 lb

## 2021-12-26 DIAGNOSIS — C9110 Chronic lymphocytic leukemia of B-cell type not having achieved remission: Secondary | ICD-10-CM | POA: Diagnosis not present

## 2021-12-26 DIAGNOSIS — D469 Myelodysplastic syndrome, unspecified: Secondary | ICD-10-CM

## 2021-12-26 DIAGNOSIS — Z5111 Encounter for antineoplastic chemotherapy: Secondary | ICD-10-CM | POA: Diagnosis not present

## 2021-12-26 DIAGNOSIS — D649 Anemia, unspecified: Secondary | ICD-10-CM

## 2021-12-26 LAB — CMP (CANCER CENTER ONLY)
ALT: 13 U/L (ref 0–44)
AST: 14 U/L — ABNORMAL LOW (ref 15–41)
Albumin: 3.9 g/dL (ref 3.5–5.0)
Alkaline Phosphatase: 114 U/L (ref 38–126)
Anion gap: 10 (ref 5–15)
BUN: 23 mg/dL (ref 8–23)
CO2: 25 mmol/L (ref 22–32)
Calcium: 9.3 mg/dL (ref 8.9–10.3)
Chloride: 99 mmol/L (ref 98–111)
Creatinine: 1.65 mg/dL — ABNORMAL HIGH (ref 0.61–1.24)
GFR, Estimated: 43 mL/min — ABNORMAL LOW (ref >60)
Glucose, Bld: 103 mg/dL — ABNORMAL HIGH (ref 70–99)
Potassium: 4.5 mmol/L (ref 3.5–5.1)
Sodium: 134 mmol/L — ABNORMAL LOW (ref 135–145)
Total Bilirubin: 0.8 mg/dL (ref 0.3–1.2)
Total Protein: 6.7 g/dL (ref 6.5–8.1)

## 2021-12-26 LAB — CBC WITH DIFFERENTIAL (CANCER CENTER ONLY)
Abs Immature Granulocytes: 0.29 10*3/uL — ABNORMAL HIGH (ref 0.00–0.07)
Basophils Absolute: 0 10*3/uL (ref 0.0–0.1)
Basophils Relative: 0 %
Eosinophils Absolute: 0 10*3/uL (ref 0.0–0.5)
Eosinophils Relative: 0 %
HCT: 34.4 % — ABNORMAL LOW (ref 39.0–52.0)
Hemoglobin: 11 g/dL — ABNORMAL LOW (ref 13.0–17.0)
Immature Granulocytes: 3 %
Lymphocytes Relative: 5 %
Lymphs Abs: 0.4 10*3/uL — ABNORMAL LOW (ref 0.7–4.0)
MCH: 29.7 pg (ref 26.0–34.0)
MCHC: 32 g/dL (ref 30.0–36.0)
MCV: 93 fL (ref 80.0–100.0)
Monocytes Absolute: 0.9 10*3/uL (ref 0.1–1.0)
Monocytes Relative: 10 %
Neutro Abs: 7.5 10*3/uL (ref 1.7–7.7)
Neutrophils Relative %: 82 %
Platelet Count: 93 10*3/uL — ABNORMAL LOW (ref 150–400)
RBC: 3.7 MIL/uL — ABNORMAL LOW (ref 4.22–5.81)
RDW: 17.8 % — ABNORMAL HIGH (ref 11.5–15.5)
WBC Count: 9.2 10*3/uL (ref 4.0–10.5)
nRBC: 0 % (ref 0.0–0.2)

## 2021-12-26 LAB — SAMPLE TO BLOOD BANK

## 2021-12-26 LAB — LACTATE DEHYDROGENASE: LDH: 278 U/L — ABNORMAL HIGH (ref 98–192)

## 2021-12-26 NOTE — Progress Notes (Signed)
Hematology and Oncology Follow Up Visit  [name] [id number] [birth year] 76 y.o. 12/26/2021   Principle Diagnosis:  Right lower lobe pulmonary nodule- non-malignant Recurrent pulmonary embolism CLL -- progressive -- 11q-/13q- Chronic neutropenia secondary to chemotherapy/CLL  Past Therapy: Calquence 100 mg po BID -- start on 03/13/2020 -- d/c on 07/05/2021 Venetoclax 100 mg po q day -- change 07/05/2021 -- d/c on 07/05/2021   Current Therapy:   Rituxan/Bendamustine -- start cycle #1 on 01/03/2022     Coumadin 5 mg po q day Ziextenzo 6 mg SQ as indicated for low WBC count/ANC   Interim History:  [name] is here today for follow-up.  [name] comes in with a niece.  She is very very nice.  We went over all of his lab work again.  We went over the indications that [name] probably needs to be treated again.  Of note, [name] was treated with 2 cycles of Rituxan/Bendamustine back in August 2018.  We have treated him so that his white cell count and adenopathy would decrease so [name] can go over to [name] for a safari.  [name] tolerated this very well.  It worked very well.  As such, I think that it would work again.  [name] is still weak.  [name] is still fatigued.  [name] did get Neulasta a week ago.  This increased his white cell count incredibly.  His white cell count is 9.2 today.  [name] feels a whole lot better.  His appetite is doing okay.  [name] has had no nausea or vomiting.  [name] has had no change in bowel or bladder habits.  [name] is on Coumadin.  At this is being managed by cardiology.  [name] has had no bleeding.  His most recent beta-2 microglobulin was up to 4.  Overall, I would say his performance status is probably ECOG 2.      Medications:  Allergies as of 12/26/2021       Reactions   Hydrocodone-acetaminophen Shortness Of Breath   Oxycodone Hcl Shortness Of Breath   Telmisartan-hctz Shortness Of Breath, Palpitations, Other (See Comments)   Dizziness also   Aspirin Rash, Other (See Comments)    Confusion, also- Cannot take high doses   Atorvastatin Other (See Comments)   Myalgia   Buspirone Hcl Other (See Comments)   Headaches   Codeine Hives, Nausea And Vomiting, Other (See Comments)   Confusion also   Morphine Sulfate Itching   Paroxetine Other (See Comments)   Paresthesias: R arm, R leg, r side of face   Rabeprazole Other (See Comments)   headache   Ramipril Other (See Comments)   Unknown reaction        Medication List        Accurate as of December 26, 2021  4:43 PM. If you have any questions, ask your nurse or doctor.          [facility] [employer] Sickness Relief 25 MG Chew Generic drug: Meclizine HCl CHEW 1 TABLET EVERY DAY AS NEEDED FOR DIZZINESS What changed: See the new instructions.   docusate sodium 100 MG capsule Commonly known as: COLACE Take 100 mg by mouth daily as needed (constipation).   enoxaparin 120 MG/0.8ML injection Commonly known as: LOVENOX Inject 0.8 mLs (120 mg total) into the skin daily.   ipratropium 0.03 % nasal spray Commonly known as: ATROVENT Place 2 sprays into both nostrils every 12 (twelve) hours. What changed:  when to take this reasons to take this   LORazepam 0.5 MG  tablet Commonly known as: ATIVAN Take 1 tablet (0.5 mg total) by mouth at bedtime as needed for anxiety (can take 1/4 or 1/2 tablet  if effective.).   omeprazole 40 MG capsule Commonly known as: PRILOSEC Take 40 mg by mouth every evening.   rosuvastatin 10 MG tablet Commonly known as: CRESTOR TAKE 1 TABLET BY MOUTH EVERY DAY   sertraline 100 MG tablet Commonly known as: ZOLOFT Take 100 mg by mouth every morning.   sodium chloride 0.65 % Soln nasal spray Commonly known as: OCEAN Place 1 spray into both nostrils as needed for congestion.   Vitamin D3 50 MCG (2000 UT) capsule Take 2,000 Units by mouth every morning.   warfarin 5 MG tablet Commonly known as: COUMADIN Take as directed by the anticoagulation clinic. If you are unsure how to take this  medication, talk to your nurse or doctor. Original instructions: TAKE AS DIRECTED BY COUMADIN CLINIC        Allergies:  Allergies  Allergen Reactions   Hydrocodone-Acetaminophen Shortness Of Breath   Oxycodone Hcl Shortness Of Breath   Telmisartan-Hctz Shortness Of Breath, Palpitations and Other (See Comments)    Dizziness also   Aspirin Rash and Other (See Comments)    Confusion, also- Cannot take high doses   Atorvastatin Other (See Comments)    Myalgia    Buspirone Hcl Other (See Comments)    Headaches    Codeine Hives, Nausea And Vomiting and Other (See Comments)    Confusion also   Morphine Sulfate Itching   Paroxetine Other (See Comments)    Paresthesias: R arm, R leg, r side of face   Rabeprazole Other (See Comments)    headache   Ramipril Other (See Comments)    Unknown reaction    Past Medical History, Surgical history, Social history, and Family History were reviewed and updated.  Review of Systems: . Review of Systems  Constitutional:  Positive for malaise/fatigue.  HENT: Negative.    Eyes: Negative.   Respiratory:  Positive for shortness of breath.   Cardiovascular:  Positive for palpitations.  Gastrointestinal:  Positive for nausea.  Genitourinary: Negative.   Musculoskeletal: Negative.   Skin: Negative.   Neurological: Negative.   Endo/Heme/Allergies: Negative.   Psychiatric/Behavioral: Negative.     Physical Exam:  weight is 165 lb 1.9 oz (74.9 kg). His oral temperature is 99.8 F (37.7 C). His blood pressure is 104/67 and his pulse is 100. His respiration is 17 and oxygen saturation is 99%.   Wt Readings from Last 3 Encounters:  12/26/21 165 lb 1.9 oz (74.9 kg)  12/20/21 163 lb (73.9 kg)  12/20/21 163 lb 6.4 oz (74.1 kg)    Physical Exam Vitals reviewed.  HENT:     Head: Normocephalic and atraumatic.  Eyes:     Pupils: Pupils are equal, round, and reactive to light.  Cardiovascular:     Rate and Rhythm: Normal rate and regular  rhythm.     Heart sounds: Normal heart sounds.  Pulmonary:     Effort: Pulmonary effort is normal.     Breath sounds: Normal breath sounds.  Abdominal:     General: Bowel sounds are normal.     Palpations: Abdomen is soft.  Musculoskeletal:        General: No tenderness or deformity. Normal range of motion.     Cervical back: Normal range of motion.  Lymphadenopathy:     Cervical: No cervical adenopathy.  Skin:    General: Skin is warm and  dry.     Findings: No erythema or rash.  Neurological:     Mental Status: [name] is alert and oriented to person, place, and time.  Psychiatric:        Behavior: Behavior normal.        Thought Content: Thought content normal.        Judgment: Judgment normal.     Lab Results  Component Value Date   WBC 9.2 12/26/2021   HGB 11.0 (L) 12/26/2021   HCT 34.4 (L) 12/26/2021   MCV 93.0 12/26/2021   PLT 93 (L) 12/26/2021   Lab Results  Component Value Date   FERRITIN 2,286 (H) 12/20/2021   IRON 81 12/20/2021   TIBC 307 12/20/2021   UIBC 226 12/20/2021   IRONPCTSAT 26 12/20/2021   Lab Results  Component Value Date   RETICCTPCT 3.0 12/20/2021   RBC 3.70 (L) 12/26/2021   RETICCTABS 51.79 05/06/2011   Lab Results  Component Value Date   KPAFRELGTCHN 23.0 (H) 12/20/2021   LAMBDASER 11.9 12/20/2021   KAPLAMBRATIO 1.93 (H) 12/20/2021   Lab Results  Component Value Date   IGGSERUM 650 12/20/2021   IGA 67 12/20/2021   IGMSERUM 34 12/20/2021   Lab Results  Component Value Date   TOTALPROTELP 6.1 07/26/2021   ALBUMINELP 3.6 07/26/2021   A1GS 0.3 07/26/2021   A2GS 0.6 07/26/2021   BETS 1.0 07/26/2021   GAMS 0.6 07/26/2021   MSPIKE Not Observed 07/26/2021   SPEI Comment 07/10/2021     Chemistry      Component Value Date/Time   NA 134 (L) 12/26/2021 1341   NA 145 07/22/2017 0756   NA 139 06/27/2016 1410   K 4.5 12/26/2021 1341   K 4.3 07/22/2017 0756   K 5.1 06/27/2016 1410   CL 99 12/26/2021 1341   CL 105 07/22/2017 0756    CO2 25 12/26/2021 1341   CO2 30 07/22/2017 0756   CO2 28 06/27/2016 1410   BUN 23 12/26/2021 1341   BUN 17 07/22/2017 0756   BUN 20.9 06/27/2016 1410   CREATININE 1.65 (H) 12/26/2021 1341   CREATININE 1.6 (H) 07/22/2017 0756   CREATININE 1.6 (H) 06/27/2016 1410      Component Value Date/Time   CALCIUM 9.3 12/26/2021 1341   CALCIUM 9.6 07/22/2017 0756   CALCIUM 9.6 06/27/2016 1410   ALKPHOS 114 12/26/2021 1341   ALKPHOS 59 07/22/2017 0756   ALKPHOS 62 06/27/2016 1410   AST 14 (L) 12/26/2021 1341   AST 18 06/27/2016 1410   ALT 13 12/26/2021 1341   ALT 24 07/22/2017 0756   ALT 24 06/27/2016 1410   BILITOT 0.8 12/26/2021 1341   BILITOT 0.46 06/27/2016 1410       Impression and Plan: [name] is a pleasant 76 yo African American gentleman with recurrent CLL and chronic neutropenia secondary to previous chemotherapy.   I do think that it would be very reasonable to treat him with Rituxan/Bendamustine.  I realize that his white cell count has been quite low.  We can certainly give him Neulasta after each cycle of treatment.  Again I do think that [name] would do well with his protocol.  I think that it would be tolerated.  I do not think [name] will get all that pancytopenic.  I would certainly adjust the doses a little bit of the Bendamustine this because of his performance status.  His niece was incredibly eloquent.  She asked a lot of very poignant questions.  Answered everything  the best I could.  I again explained why I thought that [name] would need to be treated.  I think it is obvious from the CT scan, bone marrow test that [name] had done in the beta-2 microglobulin that is CLL is clearly active.  [name] is in agreement.  We will try to get started with treatment next week.  We probably will have to see him back about 2 weeks after treatment just to see how his blood counts look.     Volanda Napoleon, MD 2/15/20234:43 PM

## 2021-12-26 NOTE — Progress Notes (Signed)
START OFF PATHWAY REGIMEN - Lymphoma and CLL ° ° °[id number]:Bendamustine 90 mg/m2 IV D1,2 + Rituximab (IV/SUBQ) D1 q28 Days x 6 Cycles: °  Cycle 1: A cycle is 28 days: °    Rituximab-xxxx  °    Bendamustine  °  Cycles 2 through 6: A cycle is every 28 days: °    Rituximab and hyaluronidase human  °    Bendamustine  ° °**Always confirm dose/schedule in your pharmacy ordering system** ° °Patient Characteristics: °Chronic Lymphocytic Leukemia (CLL), Treatment Indicated, First Line, 17p del(-) and No ATM Deletion and TP53 Mutation Negative, Age ? 60 or Frail (Any Age) °Disease Type: Chronic Lymphocytic Leukemia (CLL) °Disease Type: Not Applicable °Disease Type: Not Applicable °Treatment Indicated<= Treatment Indicated °Line of Therapy: First Line °ATM (11q22) Deletion Status: No Deletion °17p Deletion Status: Negative °TP53 Mutation Status: Negative °Patient Age: ? 60 °Patient Condition: Frail Patient °Intent of Therapy: °Non-Curative / Palliative Intent, Discussed with Patient °

## 2021-12-27 ENCOUNTER — Encounter: Payer: Self-pay | Admitting: Hematology & Oncology

## 2021-12-27 ENCOUNTER — Ambulatory Visit (INDEPENDENT_AMBULATORY_CARE_PROVIDER_SITE_OTHER): Payer: Medicare Other | Admitting: Psychology

## 2021-12-27 DIAGNOSIS — F4323 Adjustment disorder with mixed anxiety and depressed mood: Secondary | ICD-10-CM

## 2021-12-27 NOTE — Progress Notes (Signed)
Date: 12/27/2021  Treatment Plan: Diagnosis F99 (Unspecified mental disorder) [n/a]  309.28 (Adjustment disorder with mixed anxiety and depressed mood) [n/a]  Symptoms Depressed or irritable mood. (Status: maintained) -- No Description Entered  Diminished interest in or enjoyment of activities. (Status: maintained) -- No Description Entered  Experiences disturbing and persistent thoughts, images, and/or perceptions of the traumatic event. (Status: maintained) -- No Description Entered  Has been exposed to a traumatic event involving actual or perceived threat of death or serious injury. (Status: maintained) -- No Description Entered  Intentionally avoids thoughts, feelings, or discussions related to the traumatic event. (Status: maintained) -- No Description Entered  Lack of energy. (Status: maintained) -- No Description Entered  Sad affect, social withdrawal, anxiety, loss of interest in activities, and low energy. (Status: maintained) -- No Description Entered  Symptoms present more than one month. (Status: maintained) -- No Description Entered  Medication Status compliance  Safety none  If Suicidal or Homicidal State Action Taken: unspecified  Current Risk: low Medications Lorazepam (Dosage: .5mg )  Meclizine (Dosage: unknown)  Sertrline (Dosage: 100mg )  Xarelto (Dosage: unknown)  Objectives Related Problem: Develop healthy thinking patterns and beliefs about self, others, and the world that lead to the alleviation and help prevent the relapse of depression. Description: Implement mindfulness techniques for relapse prevention. Target Date: 2022-07-08 Frequency: Daily Modality: individual Progress: 50%  Related Problem: Develop healthy thinking patterns and beliefs about self, others, and the world that lead to the alleviation and help prevent the relapse of depression. Description: Learn and implement conflict resolution skills to resolve interpersonal problems. Target Date:  2022-07-08 Frequency: Daily Modality: individual Progress: 60%  Related Problem: Develop healthy thinking patterns and beliefs about self, others, and the world that lead to the alleviation and help prevent the relapse of depression. Description: Learn and implement behavioral strategies to overcome depression. Target Date: 2022-07-08 Frequency: Daily Modality: individual Progress: 50%  Related Problem: Develop healthy thinking patterns and beliefs about self, others, and the world that lead to the alleviation and help prevent the relapse of depression. Description: Describe current and past experiences with depression including their impact on functioning and attempts to resolve it. Target Date: 2022-07-08 Frequency: Daily Modality: individual Progress: 60%  Client Response full compliance  Service Location Location, [address] [place], [place] [address]  Service Code cpt [id number]  Self care activities  Emotion regulation skills  Distress tolerance skill  Validate/empathize  Identified an insight  Facilitate problem solving  Normalize/Reframe  Comments  Dx: F43.21  [name] agreed to having a video session due to the pandemic. He was at home and I was in my home office.  Meds: Sertraline (100mg ), Vit. D, Meclizine, blood pressure med, cholesterol med, Lorazepam (.5 mg as needed).  Goals: Seeking relief to depressive feelings of sadness and lack of motivation/interest in activities. Also, has some anxieties related to health and his own mortality. Goal Date: 06-11-22   [name] says he has "gone through a living hell" since we last spoke. He says he was having all of the side-effects of all the medications he is taking. He will be starting chemo again on the 23rd. He has lost weight (is 160 lbs. from 200 lbs.). He says he is "very depressed" because every time he thinks he is pulling out of the problem, something else pops up. The cancer is still in the spinal fluid and the concern is  that it will get aggressive. We talked about trying to remain positive about his  condition while feeling so depressed. He does still communicate with friends and he is allowing himself to rely on others. This is a very positive step for [name] Reedy, PhD  Time: 1:10-2:00 50 minutes

## 2021-12-28 NOTE — Progress Notes (Signed)
Pharmacist Chemotherapy Monitoring - Initial Assessment    Anticipated start date: 01/03/22   The following has been reviewed per standard work regarding the patient's treatment regimen: The patient's diagnosis, treatment plan and drug doses, and organ/hematologic function Lab orders and baseline tests specific to treatment regimen  The treatment plan start date, drug sequencing, and pre-medications Prior authorization status  Patient's documented medication list, including drug-drug interaction screen and prescriptions for anti-emetics and supportive care specific to the treatment regimen The drug concentrations, fluid compatibility, administration routes, and timing of the medications to be used The patient's access for treatment and lifetime cumulative dose history, if applicable  The patient's medication allergies and previous infusion related reactions, if applicable   Changes made to treatment plan:  N/A  Follow up needed:  Pending authorization for treatment    Claybon Jabs, [facility], 12/28/2021  10:34 AM

## 2021-12-31 ENCOUNTER — Other Ambulatory Visit: Payer: Self-pay

## 2021-12-31 ENCOUNTER — Ambulatory Visit (INDEPENDENT_AMBULATORY_CARE_PROVIDER_SITE_OTHER): Payer: No Typology Code available for payment source

## 2021-12-31 DIAGNOSIS — Z952 Presence of prosthetic heart valve: Secondary | ICD-10-CM | POA: Diagnosis not present

## 2021-12-31 DIAGNOSIS — I2699 Other pulmonary embolism without acute cor pulmonale: Secondary | ICD-10-CM | POA: Diagnosis not present

## 2021-12-31 DIAGNOSIS — Z5181 Encounter for therapeutic drug level monitoring: Secondary | ICD-10-CM

## 2021-12-31 DIAGNOSIS — I4891 Unspecified atrial fibrillation: Secondary | ICD-10-CM

## 2021-12-31 LAB — POCT INR: INR: 2.6 (ref 2.0–3.0)

## 2021-12-31 NOTE — Patient Instructions (Signed)
Description   Continue on same dosage of Warfarin 1.5 tablets daily except for 2 tablets on Sundays, Tuesdays, and Thursdays. Recheck INR in 2 weeks. Please call coumadin clinic with questions at [phone].

## 2022-01-02 ENCOUNTER — Other Ambulatory Visit: Payer: Self-pay | Admitting: *Deleted

## 2022-01-02 DIAGNOSIS — C9110 Chronic lymphocytic leukemia of B-cell type not having achieved remission: Secondary | ICD-10-CM

## 2022-01-03 ENCOUNTER — Inpatient Hospital Stay: Payer: No Typology Code available for payment source

## 2022-01-03 ENCOUNTER — Other Ambulatory Visit: Payer: Self-pay

## 2022-01-03 VITALS — BP 106/67 | HR 70 | Temp 97.8°F | Resp 15

## 2022-01-03 DIAGNOSIS — C9110 Chronic lymphocytic leukemia of B-cell type not having achieved remission: Secondary | ICD-10-CM

## 2022-01-03 DIAGNOSIS — Z5111 Encounter for antineoplastic chemotherapy: Secondary | ICD-10-CM | POA: Diagnosis not present

## 2022-01-03 LAB — COMPREHENSIVE METABOLIC PANEL
ALT: 9 U/L (ref 0–44)
AST: 16 U/L (ref 15–41)
Albumin: 3.3 g/dL — ABNORMAL LOW (ref 3.5–5.0)
Alkaline Phosphatase: 96 U/L (ref 38–126)
Anion gap: 7 (ref 5–15)
BUN: 18 mg/dL (ref 8–23)
CO2: 27 mmol/L (ref 22–32)
Calcium: 8.8 mg/dL — ABNORMAL LOW (ref 8.9–10.3)
Chloride: 102 mmol/L (ref 98–111)
Creatinine, Ser: 1.39 mg/dL — ABNORMAL HIGH (ref 0.61–1.24)
GFR, Estimated: 53 mL/min — ABNORMAL LOW (ref >60)
Glucose, Bld: 136 mg/dL — ABNORMAL HIGH (ref 70–99)
Potassium: 4.1 mmol/L (ref 3.5–5.1)
Sodium: 136 mmol/L (ref 135–145)
Total Bilirubin: 0.4 mg/dL (ref 0.3–1.2)
Total Protein: 5.8 g/dL — ABNORMAL LOW (ref 6.5–8.1)

## 2022-01-03 LAB — CBC WITH DIFFERENTIAL (CANCER CENTER ONLY)
Abs Immature Granulocytes: 0.05 10*3/uL (ref 0.00–0.07)
Basophils Absolute: 0 10*3/uL (ref 0.0–0.1)
Basophils Relative: 1 %
Eosinophils Absolute: 0 10*3/uL (ref 0.0–0.5)
Eosinophils Relative: 0 %
HCT: 29.9 % — ABNORMAL LOW (ref 39.0–52.0)
Hemoglobin: 9.8 g/dL — ABNORMAL LOW (ref 13.0–17.0)
Immature Granulocytes: 1 %
Lymphocytes Relative: 9 %
Lymphs Abs: 0.3 10*3/uL — ABNORMAL LOW (ref 0.7–4.0)
MCH: 30.1 pg (ref 26.0–34.0)
MCHC: 32.8 g/dL (ref 30.0–36.0)
MCV: 91.7 fL (ref 80.0–100.0)
Monocytes Absolute: 0.4 10*3/uL (ref 0.1–1.0)
Monocytes Relative: 9 %
Neutro Abs: 3 10*3/uL (ref 1.7–7.7)
Neutrophils Relative %: 80 %
Platelet Count: 217 10*3/uL (ref 150–400)
RBC: 3.26 MIL/uL — ABNORMAL LOW (ref 4.22–5.81)
RDW: 17.2 % — ABNORMAL HIGH (ref 11.5–15.5)
WBC Count: 3.7 10*3/uL — ABNORMAL LOW (ref 4.0–10.5)
nRBC: 0 % (ref 0.0–0.2)

## 2022-01-03 MED ORDER — ONDANSETRON HCL 8 MG PO TABS: 8.0000 mg | ORAL_TABLET | Freq: Two times a day (BID) | ORAL | 1 refills | Status: AC | PRN

## 2022-01-03 MED ORDER — SODIUM CHLORIDE 0.9 % IV SOLN: Freq: Once | INTRAVENOUS | Status: AC

## 2022-01-03 MED ORDER — SODIUM CHLORIDE 0.9 % IV SOLN
375.0000 mg/m2 | Freq: Once | INTRAVENOUS | Status: AC
  Administered 2022-01-03: 700 mg via INTRAVENOUS
  Filled 2022-01-03: qty 50

## 2022-01-03 MED ORDER — ACETAMINOPHEN 325 MG PO TABS
650.0000 mg | ORAL_TABLET | Freq: Once | ORAL | Status: AC
  Administered 2022-01-03: 650 mg via ORAL
  Filled 2022-01-03: qty 2

## 2022-01-03 MED ORDER — DIPHENHYDRAMINE HCL 25 MG PO CAPS
50.0000 mg | ORAL_CAPSULE | Freq: Once | ORAL | Status: AC
  Administered 2022-01-03: 50 mg via ORAL
  Filled 2022-01-03: qty 2

## 2022-01-03 MED ORDER — SODIUM CHLORIDE 0.9 % IV SOLN
10.0000 mg | Freq: Once | INTRAVENOUS | Status: AC
  Administered 2022-01-03: 10 mg via INTRAVENOUS
  Filled 2022-01-03: qty 10

## 2022-01-03 MED ORDER — PALONOSETRON HCL INJECTION 0.25 MG/5ML
0.2500 mg | Freq: Once | INTRAVENOUS | Status: AC
  Administered 2022-01-03: 0.25 mg via INTRAVENOUS
  Filled 2022-01-03: qty 5

## 2022-01-03 MED ORDER — DEXAMETHASONE 4 MG PO TABS: 8.0000 mg | ORAL_TABLET | Freq: Every day | ORAL | 1 refills | Status: DC

## 2022-01-03 MED ORDER — SODIUM CHLORIDE 0.9 % IV SOLN
72.0000 mg/m2 | Freq: Once | INTRAVENOUS | Status: AC
  Administered 2022-01-03: 150 mg via INTRAVENOUS
  Filled 2022-01-03: qty 6

## 2022-01-03 MED ORDER — ACYCLOVIR 400 MG PO TABS: 400.0000 mg | ORAL_TABLET | Freq: Every day | ORAL | 3 refills | Status: DC

## 2022-01-03 MED ORDER — PROCHLORPERAZINE MALEATE 10 MG PO TABS: 10.0000 mg | ORAL_TABLET | Freq: Four times a day (QID) | ORAL | 1 refills | Status: AC | PRN

## 2022-01-03 NOTE — Patient Instructions (Signed)
[facility]  Discharge Instructions: ?Thank you for choosing [facility] to provide your oncology and hematology care.  ? ?If you have a lab appointment with the Cancer Center, please go directly to the Cancer Center and check in at the registration area. ? ?Wear comfortable clothing and clothing appropriate for easy access to any Portacath or PICC line.  ? ?We strive to give you quality time with your provider. You may need to reschedule your appointment if you arrive late (15 or more minutes).  Arriving late affects you and other patients whose appointments are after yours.  Also, if you miss three or more appointments without notifying the office, you may be dismissed from the clinic at the provider?s discretion.    ?  ?For prescription refill requests, have your pharmacy contact our office and allow 72 hours for refills to be completed.   ? ?Today you received the following chemotherapy and/or immunotherapy agents Rituxan, Bendeka.    ?  ?To help prevent nausea and vomiting after your treatment, we encourage you to take your nausea medication as directed. ? ?BELOW ARE SYMPTOMS THAT SHOULD BE REPORTED IMMEDIATELY: ?*FEVER GREATER THAN 100.4 F (38 ?C) OR HIGHER ?*CHILLS OR SWEATING ?*NAUSEA AND VOMITING THAT IS NOT CONTROLLED WITH YOUR NAUSEA MEDICATION ?*UNUSUAL SHORTNESS OF BREATH ?*UNUSUAL BRUISING OR BLEEDING ?*URINARY PROBLEMS (pain or burning when urinating, or frequent urination) ?*BOWEL PROBLEMS (unusual diarrhea, constipation, pain near the anus) ?TENDERNESS IN MOUTH AND THROAT WITH OR WITHOUT PRESENCE OF ULCERS (sore throat, sores in mouth, or a toothache) ?UNUSUAL RASH, SWELLING OR PAIN  ?UNUSUAL VAGINAL DISCHARGE OR ITCHING  ? ?Items with * indicate a potential emergency and should be followed up as soon as possible or go to the Emergency Department if any problems should occur. ? ?Please show the CHEMOTHERAPY ALERT CARD or IMMUNOTHERAPY ALERT CARD at check-in  to the Emergency Department and triage nurse. ?Should you have questions after your visit or need to cancel or reschedule your appointment, please contact [facility]  [phone] and follow the prompts.  Office hours are 8:00 a.m. to 4:30 p.m. Monday - Friday. Please note that voicemails left after 4:00 p.m. may not be returned until the following business day.  We are closed weekends and major holidays. You have access to a nurse at all times for urgent questions. Please call the main number to the clinic [phone] and follow the prompts. ? ?For any non-urgent questions, you may also contact your provider using MyChart. We now offer e-Visits for anyone 18 and older to request care online for non-urgent symptoms. For details visit [email or web address]. ?  ?Also download the MyChart app! Go to the app store, search "MyChart", open the app, select , and log in with your MyChart username and password. ? ?Due to Covid, a mask is required upon entering the hospital/clinic. If you do not have a mask, one will be given to you upon arrival. For doctor visits, patients may have 1 support person aged 18 or older with them. For treatment visits, patients cannot have anyone with them due to current Covid guidelines and our immunocompromised population.  ? ? ?

## 2022-01-03 NOTE — Progress Notes (Signed)
Ok to treat with ANC of 1.3 per Dr [name]. dph

## 2022-01-04 ENCOUNTER — Ambulatory Visit: Payer: Medicare Other

## 2022-01-04 ENCOUNTER — Inpatient Hospital Stay: Payer: No Typology Code available for payment source

## 2022-01-04 VITALS — BP 112/55 | HR 74 | Temp 97.8°F | Resp 17

## 2022-01-04 DIAGNOSIS — C9110 Chronic lymphocytic leukemia of B-cell type not having achieved remission: Secondary | ICD-10-CM

## 2022-01-04 DIAGNOSIS — Z5111 Encounter for antineoplastic chemotherapy: Secondary | ICD-10-CM | POA: Diagnosis not present

## 2022-01-04 MED ORDER — SODIUM CHLORIDE 0.9 % IV SOLN
72.0000 mg/m2 | Freq: Once | INTRAVENOUS | Status: AC
  Administered 2022-01-04: 150 mg via INTRAVENOUS
  Filled 2022-01-04: qty 6

## 2022-01-04 MED ORDER — SODIUM CHLORIDE 0.9 % IV SOLN
10.0000 mg | Freq: Once | INTRAVENOUS | Status: AC
  Administered 2022-01-04: 10 mg via INTRAVENOUS
  Filled 2022-01-04: qty 10

## 2022-01-04 MED ORDER — SODIUM CHLORIDE 0.9 % IV SOLN: Freq: Once | INTRAVENOUS | Status: AC

## 2022-01-04 NOTE — Patient Instructions (Signed)
[facility]  Discharge Instructions: ?Thank you for choosing [facility] to provide your oncology and hematology care.  ? ?If you have a lab appointment with the Cancer Center, please go directly to the Cancer Center and check in at the registration area. ? ?Wear comfortable clothing and clothing appropriate for easy access to any Portacath or PICC line.  ? ?We strive to give you quality time with your provider. You may need to reschedule your appointment if you arrive late (15 or more minutes).  Arriving late affects you and other patients whose appointments are after yours.  Also, if you miss three or more appointments without notifying the office, you may be dismissed from the clinic at the provider?s discretion.    ?  ?For prescription refill requests, have your pharmacy contact our office and allow 72 hours for refills to be completed.   ? ?Today you received the following chemotherapy and/or immunotherapy agents Bendeka    ?  ?To help prevent nausea and vomiting after your treatment, we encourage you to take your nausea medication as directed. ? ?BELOW ARE SYMPTOMS THAT SHOULD BE REPORTED IMMEDIATELY: ?*FEVER GREATER THAN 100.4 F (38 ?C) OR HIGHER ?*CHILLS OR SWEATING ?*NAUSEA AND VOMITING THAT IS NOT CONTROLLED WITH YOUR NAUSEA MEDICATION ?*UNUSUAL SHORTNESS OF BREATH ?*UNUSUAL BRUISING OR BLEEDING ?*URINARY PROBLEMS (pain or burning when urinating, or frequent urination) ?*BOWEL PROBLEMS (unusual diarrhea, constipation, pain near the anus) ?TENDERNESS IN MOUTH AND THROAT WITH OR WITHOUT PRESENCE OF ULCERS (sore throat, sores in mouth, or a toothache) ?UNUSUAL RASH, SWELLING OR PAIN  ?UNUSUAL VAGINAL DISCHARGE OR ITCHING  ? ?Items with * indicate a potential emergency and should be followed up as soon as possible or go to the Emergency Department if any problems should occur. ? ?Please show the CHEMOTHERAPY ALERT CARD or IMMUNOTHERAPY ALERT CARD at check-in to the  Emergency Department and triage nurse. ?Should you have questions after your visit or need to cancel or reschedule your appointment, please contact [facility]  [phone] and follow the prompts.  Office hours are 8:00 a.m. to 4:30 p.m. Monday - Friday. Please note that voicemails left after 4:00 p.m. may not be returned until the following business day.  We are closed weekends and major holidays. You have access to a nurse at all times for urgent questions. Please call the main number to the clinic [phone] and follow the prompts. ? ?For any non-urgent questions, you may also contact your provider using MyChart. We now offer e-Visits for anyone 18 and older to request care online for non-urgent symptoms. For details visit [email or web address]. ?  ?Also download the MyChart app! Go to the app store, search "MyChart", open the app, select [facility], and log in with your MyChart username and password. ? ?Due to Covid, a mask is required upon entering the hospital/clinic. If you do not have a mask, one will be given to you upon arrival. For doctor visits, patients may have 1 support person aged 18 or older with them. For treatment visits, patients cannot have anyone with them due to current Covid guidelines and our immunocompromised population.  ?

## 2022-01-07 ENCOUNTER — Other Ambulatory Visit: Payer: Self-pay

## 2022-01-07 ENCOUNTER — Inpatient Hospital Stay: Payer: No Typology Code available for payment source

## 2022-01-07 VITALS — BP 92/62 | HR 80 | Temp 97.6°F | Resp 19

## 2022-01-07 DIAGNOSIS — D509 Iron deficiency anemia, unspecified: Secondary | ICD-10-CM

## 2022-01-07 DIAGNOSIS — D631 Anemia in chronic kidney disease: Secondary | ICD-10-CM

## 2022-01-07 DIAGNOSIS — D709 Neutropenia, unspecified: Secondary | ICD-10-CM

## 2022-01-07 DIAGNOSIS — Z5111 Encounter for antineoplastic chemotherapy: Secondary | ICD-10-CM | POA: Diagnosis not present

## 2022-01-07 MED ORDER — PEGFILGRASTIM-BMEZ 6 MG/0.6ML ~~LOC~~ SOSY
6.0000 mg | PREFILLED_SYRINGE | Freq: Once | SUBCUTANEOUS | Status: AC
  Administered 2022-01-07: 6 mg via SUBCUTANEOUS
  Filled 2022-01-07: qty 0.6

## 2022-01-07 NOTE — Progress Notes (Signed)
Date:  01/11/2022   ID:  [name], DOB 10/07/1946, MRN [id number]   PCP:  [name], MD  Cardiologist:   [name] Electrophysiologist:  None   Evaluation Performed:  Follow-Up Visit  Chief Complaint:  AVR PAF   History of Present Illness:    76 y.o. with bioprosthetic AVR Dr [name] at [facility] July 2011 for severe AR No CAD. History of PAF and recurrent DVT/PE on chronic anticoagulation  Also had LUE cephalic/basilic thrombus. CRF;s include HTN and HLD. Has CLL followed by Dr [name] post chemoRx. Also had radical prostatectomy for cancer In 2016 had RLL wedge resection for granulomatous benign nodule Has had sleep issues with nightmares and we have since stopped his beta blocker including lopressor and atenolol He is seen at [facility] for PTSD and uses Zoloft   Had right foot reconstruction 03/02/19 involving the right gastrocnemius tendon for foot drop  Echo 08/27/19 showed increased gradients on his 25 mm CE bioprosthesis with CT showing HALT/HAM started on coumadin instead of xarelto Concern related to progression of his CLL and marantic. On coumadin gradients improved somewhat with mean 39-30 mmHg peak 65-55 mmHg by TTE 12/15/19 Echo 12/13/20 stable with mean gradient 27.5 and peak 46.2 mmHg DVI 0.24 AVA 1.2 cm2   Seen by PA 03/10/20 with dyspnea and palpitations Thought to be anxiety related  Soft tissue CT neck 03/15/20 with moderate bulky cervical lymphadenopathy new/progressed since 2019 Seen by Dr [name] 09/21/20 felt the venetoclax and acalabrutnib doing good job bringing lymphocyte count down from 91% to 51%  F/U CT  10/18/20 with significant improvement He is due to have BM biopsy in August   07/09/21-07/13/21 admitted with nosebleed, fever, chills with staph hominis bacteremia ? 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 in [place]. 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 (most recent 07/2017)   History of prostate cancer 2003   Rad prost   History of pulmonary embolism 2011; 10/2014   Recurrence off of anticoagulation 10/2014: per hem/onc ([name]) pt needs lifelong anticoagulation (hypercoag w/u neg).   Hyperlipidemia    statin-intolerant, except for crestor low dose.   Hypertension    Migraine syndrome    Mitral regurgitation    PAF (paroxysmal atrial fibrillation) (HCC)    Panic attacks    "           "             "                  "                  "                       "                             "  Pulmonary nodule, right 2015   RLL: resected at [facility] --VATS; Benign RLL nodule (fungal and AFB stains neg).  Plan is for [facility] thoracic surg to do f/u o/v & CT chest 1 yr    Right-sided headache 07/2016   Primary stabbing HA or migraine per neuro (Dr. Everlena Cooper).  Gabapentin and topamax not tolerated.  These resolved spontaneously.   Past Surgical History:  Procedure Laterality Date   AORTIC VALVE REPLACEMENT  2011   [facility]   (bioprosthetic)   CARDIAC CATHETERIZATION  04/2010   Normal coronaries.   Carotid dopplers  08/2015   NORMAL   CATARACT EXTRACTION     L 12/06/09   R 09/14/09   COLONOSCOPY  01/26/13   tic's, o/w normal.  ([facility])--recall 10 yrs.   PENILE PROSTHESIS IMPLANT     PROSTATECTOMY  04/2002   for prostate cancer   Pulmonary nodule resection  Fall/winter 2016   [facility]   SHOULDER SURGERY     rotator cuff on both arms    TEE WITHOUT CARDIOVERSION N/A 07/13/2021   Procedure: TRANSESOPHAGEAL ECHOCARDIOGRAM (TEE);  Surgeon: Chrystie Nose, MD;  Location: [facility] ENDOSCOPY;  Service: Cardiovascular;  Laterality: N/A;   TIBIALIS TENDON TRANSFER / REPAIR Right 03/02/2019   Procedure: RECONSTRUCTION OF RIGHT  ANTERIOR TIBIALIS TENDON;  Surgeon: Terance Hart, MD;  Location: [facility];  Service: Orthopedics;  Laterality: Right;   TRANSTHORACIC ECHOCARDIOGRAM  09/20/2014; 03/2016; 03/2017   2015-mild LVH, EF 50-55%, wall motion nl, grade I diast dysfxn, prosth aort valve good,transaortic gradients decreased compared to echo 11/2013.   03/2016: EF 55-60%, normal LV function, severe LVH, normal diast fxn, mild increase in AV gradient compared to 09/2014 echo.  2018: EF 55-60%, normal LV fxn, grd II DD, AV stable/gradient's stable.     No outpatient medications have been marked as taking for the 01/11/22 encounter (Office Visit) with Wendall Stade, MD.     Allergies:   Hydrocodone-acetaminophen, Oxycodone hcl, Telmisartan-hctz, Aspirin, Atorvastatin, Buspirone hcl, Codeine, Morphine sulfate, Paroxetine, Rabeprazole, and Ramipril   Social History   Tobacco Use   Smoking status: Former    Packs/day: 1.00    Years: 12.00    Pack years: 12.00    Types: Cigarettes    Start date: 11/28/1968    Quit date: 01/29/1981    Years since quitting: 40.9   Smokeless tobacco: Never   Tobacco comments:    quit 76 yo   Vaping Use   Vaping Use: Never used  Substance Use Topics   Alcohol use: Not Currently     Comment: occasional   Drug use: No     Family Hx: The patient's family history includes Cancer in his sister; Colon cancer (age of onset: 63) in his sister; Prostate cancer in his father; Stroke in his brother, mother, and sister. There is no history of Heart attack.  ROS:   Please see the history of present illness.     All other systems reviewed and are negative.   Prior CV studies:   The following studies were reviewed today:  Echo 04/08/18  EF 60-65% mild MR moderate LAE stable mean gradient through AVR 18 mmHg peak 31 mmHg DVI 0.32   Labs/Other Tests and Data Reviewed:    EKG:  01/11/2022 SR rate 76 RBBB/LAFB    Recent Labs: 01/03/2022: ALT 9; BUN 18; Creatinine, Ser 1.39; Hemoglobin 9.8; Platelet Count 217; Potassium 4.1; Sodium 136   Recent Lipid Panel Lab Results  Component Value Date/Time  CHOL 170 05/18/2021 02:27 PM   TRIG 238.0 (H) 05/18/2021 02:27 PM   HDL 35.80 (L) 05/18/2021 02:27 PM   CHOLHDL 5 05/18/2021 02:27 PM   LDLCALC 110 (H) 05/12/2020 08:40 AM   LDLDIRECT 95.0 05/18/2021 02:27 PM    Wt Readings from Last 3 Encounters:  01/11/22 166 lb 3.2 oz (75.4 kg)  12/26/21 165 lb 1.9 oz (74.9 kg)  12/20/21 163 lb (73.9 kg)     Objective:    Vital Signs:  BP 120/68    Pulse 73    Ht $R'5\' 10"'mA$  (1.778 m)    Wt 166 lb 3.2 oz (75.4 kg)    SpO2 98%    BMI 23.85 kg/m    Affect appropriate Frail elderly male with low body weight HEENT: normal Neck supple with no adenopathy JVP normal no bruits no thyromegaly Lungs clear with no wheezing and good diaphragmatic motion Heart:  S1/S2 SEM  murmur, no rub, gallop or click PMI normal post sternotomy  Abdomen: benighn, BS positve, no tenderness, no AAA no bruit.  No HSM or HJR Distal pulses intact with no bruits No edema Neuro non-focal Skin warm and dry No muscular weakness    ASSESSMENT & PLAN:    AVR:  2011 25 mm CE. Marked increase in gradients on echo 08/27/19 with cardiac CT showeing HALT/HAM despite  being on xarelto. Changed to coumadin. TEE 07/13/21 stable no SBE INR 3.9 today Will hold dose for a day    History of DVT (deep vein thrombosis) and Recurrent pulmonary embolism Continue coumadin . This is managed by heme/onc.    Paroxysmal atrial fibrillation Maintaining NSR.  Continue  Coumadin     Essential hypertension Borderline control.  Continue to monitor.   HLD (hyperlipidemia) Has had leg pain with statins in past. Taking crestor 3 x/week    Lung nodule  [facility] VATS at [facility]  Recent CT see above f/u [facility]    Carotid Bruit:  Left duplex no stenosis f/u 08/2019    Depression:  Better off paroxetine now on low dose prozac with tofranil   Headache:  F/u primary PRN tylenol ok has schrap metal on right side of head Cant have MRI   IDC:VUDT with primary typically Cr around 1.25 07/13/21    BBB:  RBBB LAD post AVR ECG q 6 months follow for more advanced AV block    CLL:  F/u with Dr [name]  new Rx started for progression    Insomnia:  Improved off beta blocker and on zoloft   ENT:  Hoarseness ? Related to CLL f/u ENT     Medication Adjustments/Labs and Tests Ordered: Current medicines are reviewed at length with the patient today.  Concerns regarding medicines are outlined above.   Tests Ordered:  None   Medication Changes:  None   Disposition:  F/u with me in 6 months   Time:  reviewing chart recent hospitalization TEE images direct patient interview and composing note 22 minutes   Signed, Jenkins Rouge, MD  01/11/2022 10:14 AM    [place]

## 2022-01-07 NOTE — Patient Instructions (Signed)

## 2022-01-11 ENCOUNTER — Ambulatory Visit (INDEPENDENT_AMBULATORY_CARE_PROVIDER_SITE_OTHER): Payer: No Typology Code available for payment source | Admitting: Cardiovascular Disease

## 2022-01-11 ENCOUNTER — Other Ambulatory Visit: Payer: Self-pay

## 2022-01-11 ENCOUNTER — Encounter: Payer: Self-pay | Admitting: Cardiovascular Disease

## 2022-01-11 ENCOUNTER — Ambulatory Visit (INDEPENDENT_AMBULATORY_CARE_PROVIDER_SITE_OTHER): Payer: No Typology Code available for payment source | Admitting: *Deleted

## 2022-01-11 VITALS — BP 120/68 | HR 73 | Ht 70.0 in | Wt 166.2 lb

## 2022-01-11 DIAGNOSIS — I2699 Other pulmonary embolism without acute cor pulmonale: Secondary | ICD-10-CM

## 2022-01-11 DIAGNOSIS — I4891 Unspecified atrial fibrillation: Secondary | ICD-10-CM

## 2022-01-11 DIAGNOSIS — Z952 Presence of prosthetic heart valve: Secondary | ICD-10-CM | POA: Diagnosis not present

## 2022-01-11 DIAGNOSIS — C9110 Chronic lymphocytic leukemia of B-cell type not having achieved remission: Secondary | ICD-10-CM | POA: Diagnosis not present

## 2022-01-11 DIAGNOSIS — Z5181 Encounter for therapeutic drug level monitoring: Secondary | ICD-10-CM

## 2022-01-11 LAB — POCT INR: INR: 3.9 — AB (ref 2.0–3.0)

## 2022-01-11 NOTE — Patient Instructions (Signed)
Description   ?Do not take any Warfarin tomorrow then continue Warfarin 1.5 tablets daily except for 2 tablets on Sundays, Tuesdays, and Thursdays. Recheck INR in 1 week. Please call coumadin clinic with questions at [phone].  ?  ?  ?

## 2022-01-11 NOTE — Patient Instructions (Addendum)
Medication Instructions:  ?Your physician recommends that you continue on your current medications as directed. Please refer to the Current Medication list given to you today. ? ?*If you need a refill on your cardiac medications before your next appointment, please call your pharmacy* ? ?Lab Work: ?If you have labs (blood work) drawn today and your tests are completely normal, you will receive your results only by: ?MyChart Message (if you have MyChart) OR ?A paper copy in the mail ?If you have any lab test that is abnormal or we need to change your treatment, we will call you to review the results. ? ?Testing/Procedures: ?None ordered today. ? ?Follow-Up: ?At [facility], you and your health needs are our priority.  As part of our continuing mission to provide you with exceptional heart care, we have created designated Provider Care Teams.  These Care Teams include your primary Cardiologist (physician) and Advanced Practice Providers (APPs -  Physician Assistants and Nurse Practitioners) who all work together to provide you with the care you need, when you need it. ? ?We recommend signing up for the patient portal called "MyChart".  Sign up information is provided on this After Visit Summary.  MyChart is used to connect with patients for Virtual Visits (Telemedicine).  Patients are able to view lab/test results, encounter notes, upcoming appointments, etc.  Non-urgent messages can be sent to your provider as well.   ?To learn more about what you can do with MyChart, go to [email or web address].   ? ?Your next appointment:   ?3 month(s) ? ?The format for your next appointment:   ?In Person ? ?Provider:   ?Jenkins Rouge, MD { ? ? ?  ?

## 2022-01-17 ENCOUNTER — Inpatient Hospital Stay (HOSPITAL_BASED_OUTPATIENT_CLINIC_OR_DEPARTMENT_OTHER): Payer: No Typology Code available for payment source | Admitting: Hematology & Oncology

## 2022-01-17 ENCOUNTER — Other Ambulatory Visit: Payer: Self-pay

## 2022-01-17 ENCOUNTER — Encounter: Payer: Self-pay | Admitting: Hematology & Oncology

## 2022-01-17 ENCOUNTER — Inpatient Hospital Stay: Payer: No Typology Code available for payment source | Attending: Hematology & Oncology

## 2022-01-17 VITALS — BP 127/76 | HR 73 | Temp 97.8°F | Resp 18 | Ht 70.0 in | Wt 169.0 lb

## 2022-01-17 DIAGNOSIS — Z5189 Encounter for other specified aftercare: Secondary | ICD-10-CM | POA: Diagnosis not present

## 2022-01-17 DIAGNOSIS — T451X5A Adverse effect of antineoplastic and immunosuppressive drugs, initial encounter: Secondary | ICD-10-CM | POA: Diagnosis not present

## 2022-01-17 DIAGNOSIS — C9110 Chronic lymphocytic leukemia of B-cell type not having achieved remission: Secondary | ICD-10-CM | POA: Insufficient documentation

## 2022-01-17 DIAGNOSIS — Z79899 Other long term (current) drug therapy: Secondary | ICD-10-CM | POA: Insufficient documentation

## 2022-01-17 DIAGNOSIS — D701 Agranulocytosis secondary to cancer chemotherapy: Secondary | ICD-10-CM | POA: Diagnosis not present

## 2022-01-17 DIAGNOSIS — Z5112 Encounter for antineoplastic immunotherapy: Secondary | ICD-10-CM | POA: Diagnosis present

## 2022-01-17 DIAGNOSIS — Z5111 Encounter for antineoplastic chemotherapy: Secondary | ICD-10-CM | POA: Insufficient documentation

## 2022-01-17 LAB — CMP (CANCER CENTER ONLY)
ALT: 12 U/L (ref 0–44)
AST: 17 U/L (ref 15–41)
Albumin: 4 g/dL (ref 3.5–5.0)
Alkaline Phosphatase: 136 U/L — ABNORMAL HIGH (ref 38–126)
Anion gap: 8 (ref 5–15)
BUN: 18 mg/dL (ref 8–23)
CO2: 28 mmol/L (ref 22–32)
Calcium: 9.3 mg/dL (ref 8.9–10.3)
Chloride: 104 mmol/L (ref 98–111)
Creatinine: 1.12 mg/dL (ref 0.61–1.24)
GFR, Estimated: >60 mL/min (ref >60)
Glucose, Bld: 94 mg/dL (ref 70–99)
Potassium: 4.7 mmol/L (ref 3.5–5.1)
Sodium: 140 mmol/L (ref 135–145)
Total Bilirubin: 0.4 mg/dL (ref 0.3–1.2)
Total Protein: 6.9 g/dL (ref 6.5–8.1)

## 2022-01-17 LAB — CBC WITH DIFFERENTIAL (CANCER CENTER ONLY)
Abs Immature Granulocytes: 0.07 10*3/uL (ref 0.00–0.07)
Basophils Absolute: 0 10*3/uL (ref 0.0–0.1)
Basophils Relative: 1 %
Eosinophils Absolute: 0.1 10*3/uL (ref 0.0–0.5)
Eosinophils Relative: 1 %
HCT: 33.6 % — ABNORMAL LOW (ref 39.0–52.0)
Hemoglobin: 10.9 g/dL — ABNORMAL LOW (ref 13.0–17.0)
Immature Granulocytes: 1 %
Lymphocytes Relative: 8 %
Lymphs Abs: 0.6 10*3/uL — ABNORMAL LOW (ref 0.7–4.0)
MCH: 30.5 pg (ref 26.0–34.0)
MCHC: 32.4 g/dL (ref 30.0–36.0)
MCV: 94.1 fL (ref 80.0–100.0)
Monocytes Absolute: 0.9 10*3/uL (ref 0.1–1.0)
Monocytes Relative: 12 %
Neutro Abs: 6.3 10*3/uL (ref 1.7–7.7)
Neutrophils Relative %: 77 %
Platelet Count: 162 10*3/uL (ref 150–400)
RBC: 3.57 MIL/uL — ABNORMAL LOW (ref 4.22–5.81)
RDW: 17.5 % — ABNORMAL HIGH (ref 11.5–15.5)
WBC Count: 8.1 10*3/uL (ref 4.0–10.5)
nRBC: 0 % (ref 0.0–0.2)

## 2022-01-17 LAB — SURGICAL PATHOLOGY

## 2022-01-17 LAB — LACTATE DEHYDROGENASE: LDH: 259 U/L — ABNORMAL HIGH (ref 98–192)

## 2022-01-17 LAB — URIC ACID: Uric Acid, Serum: 7.2 mg/dL (ref 3.7–8.6)

## 2022-01-17 NOTE — Progress Notes (Signed)
Hematology and Oncology Follow Up Visit  [name] [id number] Jan 20, 1946 76 y.o. 01/17/2022   Principle Diagnosis:  Right lower lobe pulmonary nodule- non-malignant Recurrent pulmonary embolism CLL -- progressive -- 11q-/13q- Chronic neutropenia secondary to chemotherapy/CLL  Past Therapy: Calquence 100 mg po BID -- start on 03/13/2020 -- d/c on 07/05/2021 Venetoclax 100 mg po q day -- change 07/05/2021 -- d/c on 07/05/2021   Current Therapy:   Rituxan/Bendamustine -- s/p cycle #1 -- start on 01/03/2022     Coumadin 5 mg po q day Ziextenzo 6 mg SQ as indicated for low WBC count/ANC   Interim History:  [name] is here today for follow-up.  He looks a whole lot better.  The first cycle of chemotherapy really has helped quite a lot.  I am so happy for him.  He just feels better.  He has more energy.  There is no issues with fever.  His white cell count certainly has come up.  He has had no problems with nausea or vomiting.  He is eating quite a bit.  His weight is going up slowly which is also wonderful to see.  He has had no rashes.  He has had no problems with his heart.  He has had no change in bowel or bladder habits.  He has had no headache.  Again, his activity level has come up quite nicely.  His performance status right now is ECOG 1.       Medications:  Allergies as of 01/17/2022       Reactions   Hydrocodone-acetaminophen Shortness Of Breath   Oxycodone Hcl Shortness Of Breath   Telmisartan-hctz Shortness Of Breath, Palpitations, Other (See Comments)   Dizziness also   Aspirin Rash, Other (See Comments)   Confusion, also- Cannot take high doses   Atorvastatin Other (See Comments)   Myalgia   Buspirone Hcl Other (See Comments)   Headaches   Codeine Hives, Nausea And Vomiting, Other (See Comments)   Confusion also   Morphine Sulfate Itching   Paroxetine Other (See Comments)   Paresthesias: R arm, R leg, r side of face   Rabeprazole Other (See Comments)    headache   Ramipril Other (See Comments)   Unknown reaction        Medication List        Accurate as of January 17, 2022  3:39 PM. If you have any questions, ask your nurse or doctor.          STOP taking these medications    enoxaparin 120 MG/0.8ML injection Commonly known as: LOVENOX Stopped by: Volanda Napoleon, MD       TAKE these medications    acyclovir 400 MG tablet Commonly known as: ZOVIRAX Take 1 tablet (400 mg total) by mouth daily.   [employer] Sickness Relief 25 MG Chew Generic drug: Meclizine HCl CHEW 1 TABLET EVERY DAY AS NEEDED FOR DIZZINESS What changed: See the new instructions.   dexamethasone 4 MG tablet Commonly known as: DECADRON Take 2 tablets (8 mg total) by mouth daily. Start the day after bendamustine chemotherapy for 2 days. Take with food.   docusate sodium 100 MG capsule Commonly known as: COLACE Take 100 mg by mouth daily as needed (constipation).   ipratropium 0.03 % nasal spray Commonly known as: ATROVENT Place 2 sprays into both nostrils every 12 (twelve) hours. What changed:  when to take this reasons to take this   LORazepam 0.5 MG tablet Commonly known as: ATIVAN Take  1 tablet (0.5 mg total) by mouth at bedtime as needed for anxiety (can take 1/4 or 1/2 tablet  if effective.).   omeprazole 40 MG capsule Commonly known as: PRILOSEC Take 40 mg by mouth every evening.   ondansetron 8 MG tablet Commonly known as: Zofran Take 1 tablet (8 mg total) by mouth 2 (two) times daily as needed for refractory nausea / vomiting. Start on day 2 after bendamustine chemo.   prochlorperazine 10 MG tablet Commonly known as: COMPAZINE Take 1 tablet (10 mg total) by mouth every 6 (six) hours as needed (Nausea or vomiting).   sertraline 100 MG tablet Commonly known as: ZOLOFT Take 100 mg by mouth every morning.   sodium chloride 0.65 % Soln nasal spray Commonly known as: OCEAN Place 1 spray into both nostrils as needed for  congestion.   Vitamin D3 50 MCG (2000 UT) capsule Take 2,000 Units by mouth every morning.   warfarin 5 MG tablet Commonly known as: COUMADIN Take as directed by the anticoagulation clinic. If you are unsure how to take this medication, talk to your nurse or doctor. Original instructions: TAKE AS DIRECTED BY COUMADIN CLINIC        Allergies:  Allergies  Allergen Reactions   Hydrocodone-Acetaminophen Shortness Of Breath   Oxycodone Hcl Shortness Of Breath   Telmisartan-Hctz Shortness Of Breath, Palpitations and Other (See Comments)    Dizziness also   Aspirin Rash and Other (See Comments)    Confusion, also- Cannot take high doses   Atorvastatin Other (See Comments)    Myalgia    Buspirone Hcl Other (See Comments)    Headaches    Codeine Hives, Nausea And Vomiting and Other (See Comments)    Confusion also   Morphine Sulfate Itching   Paroxetine Other (See Comments)    Paresthesias: R arm, R leg, r side of face   Rabeprazole Other (See Comments)    headache   Ramipril Other (See Comments)    Unknown reaction    Past Medical History, Surgical history, Social history, and Family History were reviewed and updated.  Review of Systems: . Review of Systems  Constitutional:  Positive for malaise/fatigue.  HENT: Negative.    Eyes: Negative.   Respiratory:  Positive for shortness of breath.   Cardiovascular:  Positive for palpitations.  Gastrointestinal:  Positive for nausea.  Genitourinary: Negative.   Musculoskeletal: Negative.   Skin: Negative.   Neurological: Negative.   Endo/Heme/Allergies: Negative.   Psychiatric/Behavioral: Negative.     Physical Exam:  height is '5\' 10"'$  (1.778 m) and weight is 169 lb (76.7 kg). His oral temperature is 97.8 F (36.6 C). His blood pressure is 127/76 and his pulse is 73. His respiration is 18 and oxygen saturation is 100%.   Wt Readings from Last 3 Encounters:  01/17/22 169 lb (76.7 kg)  01/11/22 166 lb 3.2 oz (75.4 kg)   12/26/21 165 lb 1.9 oz (74.9 kg)    Physical Exam Vitals reviewed.  HENT:     Head: Normocephalic and atraumatic.  Eyes:     Pupils: Pupils are equal, round, and reactive to light.  Cardiovascular:     Rate and Rhythm: Normal rate and regular rhythm.     Heart sounds: Normal heart sounds.  Pulmonary:     Effort: Pulmonary effort is normal.     Breath sounds: Normal breath sounds.  Abdominal:     General: Bowel sounds are normal.     Palpations: Abdomen is soft.  Musculoskeletal:  General: No tenderness or deformity. Normal range of motion.     Cervical back: Normal range of motion.  Lymphadenopathy:     Cervical: No cervical adenopathy.  Skin:    General: Skin is warm and dry.     Findings: No erythema or rash.  Neurological:     Mental Status: He is alert and oriented to person, place, and time.  Psychiatric:        Behavior: Behavior normal.        Thought Content: Thought content normal.        Judgment: Judgment normal.     Lab Results  Component Value Date   WBC 8.1 01/17/2022   HGB 10.9 (L) 01/17/2022   HCT 33.6 (L) 01/17/2022   MCV 94.1 01/17/2022   PLT 162 01/17/2022   Lab Results  Component Value Date   FERRITIN 2,286 (H) 12/20/2021   IRON 81 12/20/2021   TIBC 307 12/20/2021   UIBC 226 12/20/2021   IRONPCTSAT 26 12/20/2021   Lab Results  Component Value Date   RETICCTPCT 3.0 12/20/2021   RBC 3.57 (L) 01/17/2022   RETICCTABS 51.79 05/06/2011   Lab Results  Component Value Date   KPAFRELGTCHN 23.0 (H) 12/20/2021   LAMBDASER 11.9 12/20/2021   KAPLAMBRATIO 1.93 (H) 12/20/2021   Lab Results  Component Value Date   IGGSERUM 650 12/20/2021   IGA 67 12/20/2021   IGMSERUM 34 12/20/2021   Lab Results  Component Value Date   TOTALPROTELP 6.1 07/26/2021   ALBUMINELP 3.6 07/26/2021   A1GS 0.3 07/26/2021   A2GS 0.6 07/26/2021   BETS 1.0 07/26/2021   GAMS 0.6 07/26/2021   MSPIKE Not Observed 07/26/2021   SPEI Comment 07/10/2021      Chemistry      Component Value Date/Time   NA 140 01/17/2022 1459   NA 145 07/22/2017 0756   NA 139 06/27/2016 1410   K 4.7 01/17/2022 1459   K 4.3 07/22/2017 0756   K 5.1 06/27/2016 1410   CL 104 01/17/2022 1459   CL 105 07/22/2017 0756   CO2 28 01/17/2022 1459   CO2 30 07/22/2017 0756   CO2 28 06/27/2016 1410   BUN 18 01/17/2022 1459   BUN 17 07/22/2017 0756   BUN 20.9 06/27/2016 1410   CREATININE 1.12 01/17/2022 1459   CREATININE 1.6 (H) 07/22/2017 0756   CREATININE 1.6 (H) 06/27/2016 1410      Component Value Date/Time   CALCIUM 9.3 01/17/2022 1459   CALCIUM 9.6 07/22/2017 0756   CALCIUM 9.6 06/27/2016 1410   ALKPHOS 136 (H) 01/17/2022 1459   ALKPHOS 59 07/22/2017 0756   ALKPHOS 62 06/27/2016 1410   AST 17 01/17/2022 1459   AST 18 06/27/2016 1410   ALT 12 01/17/2022 1459   ALT 24 07/22/2017 0756   ALT 24 06/27/2016 1410   BILITOT 0.4 01/17/2022 1459   BILITOT 0.46 06/27/2016 1410       Impression and Plan: [name] is a pleasant 76 yo African American gentleman with recurrent CLL and chronic neutropenia secondary to previous chemotherapy.   It is apparent that the chemotherapy is doing very well for him.  I am just very happy about this.  His quality of life is doing better.  We will go with a second cycle in 2 weeks.  After the second cycle, I will repeat a CT scan and hopefully will see that his lymphadenopathy has resolved.  For right now, I will adjust the Bendamustine dose a little  bit.  I will plan to see him back in 2 weeks when he has a second cycle of treatment.   Volanda Napoleon, MD 3/9/20233:39 PM

## 2022-01-18 ENCOUNTER — Ambulatory Visit (INDEPENDENT_AMBULATORY_CARE_PROVIDER_SITE_OTHER): Payer: Medicare Other

## 2022-01-18 ENCOUNTER — Other Ambulatory Visit: Payer: Self-pay | Admitting: Family Medicine

## 2022-01-18 DIAGNOSIS — Z5181 Encounter for therapeutic drug level monitoring: Secondary | ICD-10-CM | POA: Diagnosis not present

## 2022-01-18 DIAGNOSIS — I4891 Unspecified atrial fibrillation: Secondary | ICD-10-CM | POA: Diagnosis not present

## 2022-01-18 DIAGNOSIS — I2699 Other pulmonary embolism without acute cor pulmonale: Secondary | ICD-10-CM | POA: Diagnosis not present

## 2022-01-18 DIAGNOSIS — Z952 Presence of prosthetic heart valve: Secondary | ICD-10-CM | POA: Diagnosis not present

## 2022-01-18 LAB — POCT INR: INR: 1.9 — AB (ref 2.0–3.0)

## 2022-01-18 NOTE — Patient Instructions (Signed)
-   take 2 tablets warfarin tonight,then  ?- continue Warfarin 1.5 tablets daily except for 2 tablets on Sundays, Tuesdays, and Thursdays.  ? - Recheck INR in 1 week.  ?Please call coumadin clinic with questions at [phone].  ?

## 2022-01-21 ENCOUNTER — Ambulatory Visit (INDEPENDENT_AMBULATORY_CARE_PROVIDER_SITE_OTHER): Payer: Medicare Other

## 2022-01-21 VITALS — BP 120/72 | HR 77 | Temp 97.7°F | Wt 171.4 lb

## 2022-01-21 DIAGNOSIS — Z0000 Encounter for general adult medical examination without abnormal findings: Secondary | ICD-10-CM

## 2022-01-21 NOTE — Patient Instructions (Signed)
[name] , Thank you for taking time to come for your Medicare Wellness Visit. I appreciate your ongoing commitment to your health goals. Please review the following plan we discussed and let me know if I can assist you in the future.   Screening recommendations/referrals: Colonoscopy: Done 01/26/13 repeat every 10 years  Recommended yearly ophthalmology/optometry visit for glaucoma screening and checkup Recommended yearly dental visit for hygiene and checkup  Vaccinations: Influenza vaccine: Done 07/25/21 repeat every year  Pneumococcal vaccine: Up to date Tdap vaccine: Done 12/20/20 repeat every 10 years  Shingles vaccine: dose 02/18/21   Covid-19: Completed 1/31, 2/21, 08/03/20 & 4/18, 09/20/21  Advanced directives: Please bring a copy of your health care power of attorney and living will to the office at your convenience.  Conditions/risks identified: Beat cancer!!!  Next appointment: Follow up in one year for your annual wellness visit.   Preventive Care 38 Years and Older, Male Preventive care refers to lifestyle choices and visits with your health care provider that can promote health and wellness. What does preventive care include? A yearly physical exam. This is also called an annual well check. Dental exams once or twice a year. Routine eye exams. Ask your health care provider how often you should have your eyes checked. Personal lifestyle choices, including: Daily care of your teeth and gums. Regular physical activity. Eating a healthy diet. Avoiding tobacco and drug use. Limiting alcohol use. Practicing safe sex. Taking low doses of aspirin every day. Taking vitamin and mineral supplements as recommended by your health care provider. What happens during an annual well check? The services and screenings done by your health care provider during your annual well check will depend on your age, overall health, lifestyle risk factors, and family history of disease. Counseling   Your health care provider may ask you questions about your: Alcohol use. Tobacco use. Drug use. Emotional well-being. Home and relationship well-being. Sexual activity. Eating habits. History of falls. Memory and ability to understand (cognition). Work and work Statistician. Screening  You may have the following tests or measurements: Height, weight, and BMI. Blood pressure. Lipid and cholesterol levels. These may be checked every 5 years, or more frequently if you are over 29 years old. Skin check. Lung cancer screening. You may have this screening every year starting at age 71 if you have a 30-pack-year history of smoking and currently smoke or have quit within the past 15 years. Fecal occult blood test (FOBT) of the stool. You may have this test every year starting at age 26. Flexible sigmoidoscopy or colonoscopy. You may have a sigmoidoscopy every 5 years or a colonoscopy every 10 years starting at age 32. Prostate cancer screening. Recommendations will vary depending on your family history and other risks. Hepatitis C blood test. Hepatitis B blood test. Sexually transmitted disease (STD) testing. Diabetes screening. This is done by checking your blood sugar (glucose) after you have not eaten for a while (fasting). You may have this done every 1-3 years. Abdominal aortic aneurysm (AAA) screening. You may need this if you are a current or former smoker. Osteoporosis. You may be screened starting at age 85 if you are at high risk. Talk with your health care provider about your test results, treatment options, and if necessary, the need for more tests. Vaccines  Your health care provider may recommend certain vaccines, such as: Influenza vaccine. This is recommended every year. Tetanus, diphtheria, and acellular pertussis (Tdap, Td) vaccine. You may need a Td booster every  10 years. Zoster vaccine. You may need this after age 72. Pneumococcal 13-valent conjugate (PCV13) vaccine.  One dose is recommended after age 39. Pneumococcal polysaccharide (PPSV23) vaccine. One dose is recommended after age 100. Talk to your health care provider about which screenings and vaccines you need and how often you need them. This information is not intended to replace advice given to you by your health care provider. Make sure you discuss any questions you have with your health care provider. Document Released: 11/24/2015 Document Revised: 07/17/2016 Document Reviewed: 08/29/2015 Elsevier Interactive Patient Education  2017 [place] Prevention in the Home Falls can cause injuries. They can happen to people of all ages. There are many things you can do to make your home safe and to help prevent falls. What can I do on the outside of my home? Regularly fix the edges of walkways and driveways and fix any cracks. Remove anything that might make you trip as you walk through a door, such as a raised step or threshold. Trim any bushes or trees on the path to your home. Use bright outdoor lighting. Clear any walking paths of anything that might make someone trip, such as rocks or tools. Regularly check to see if handrails are loose or broken. Make sure that both sides of any steps have handrails. Any raised decks and porches should have guardrails on the edges. Have any leaves, snow, or ice cleared regularly. Use sand or salt on walking paths during winter. Clean up any spills in your garage right away. This includes oil or grease spills. What can I do in the bathroom? Use night lights. Install grab bars by the toilet and in the tub and shower. Do not use towel bars as grab bars. Use non-skid mats or decals in the tub or shower. If you need to sit down in the shower, use a plastic, non-slip stool. Keep the floor dry. Clean up any water that spills on the floor as soon as it happens. Remove soap buildup in the tub or shower regularly. Attach bath mats securely with double-sided  non-slip rug tape. Do not have throw rugs and other things on the floor that can make you trip. What can I do in the bedroom? Use night lights. Make sure that you have a light by your bed that is easy to reach. Do not use any sheets or blankets that are too big for your bed. They should not hang down onto the floor. Have a firm chair that has side arms. You can use this for support while you get dressed. Do not have throw rugs and other things on the floor that can make you trip. What can I do in the kitchen? Clean up any spills right away. Avoid walking on wet floors. Keep items that you use a lot in easy-to-reach places. If you need to reach something above you, use a strong step stool that has a grab bar. Keep electrical cords out of the way. Do not use floor polish or wax that makes floors slippery. If you must use wax, use non-skid floor wax. Do not have throw rugs and other things on the floor that can make you trip. What can I do with my stairs? Do not leave any items on the stairs. Make sure that there are handrails on both sides of the stairs and use them. Fix handrails that are broken or loose. Make sure that handrails are as long as the stairways. Check any carpeting to make  sure that it is firmly attached to the stairs. Fix any carpet that is loose or worn. Avoid having throw rugs at the top or bottom of the stairs. If you do have throw rugs, attach them to the floor with carpet tape. Make sure that you have a light switch at the top of the stairs and the bottom of the stairs. If you do not have them, ask someone to add them for you. What else can I do to help prevent falls? Wear shoes that: Do not have high heels. Have rubber bottoms. Are comfortable and fit you well. Are closed at the toe. Do not wear sandals. If you use a stepladder: Make sure that it is fully opened. Do not climb a closed stepladder. Make sure that both sides of the stepladder are locked into place. Ask  someone to hold it for you, if possible. Clearly mark and make sure that you can see: Any grab bars or handrails. First and last steps. Where the edge of each step is. Use tools that help you move around (mobility aids) if they are needed. These include: Canes. Walkers. Scooters. Crutches. Turn on the lights when you go into a dark area. Replace any light bulbs as soon as they burn out. Set up your furniture so you have a clear path. Avoid moving your furniture around. If any of your floors are uneven, fix them. If there are any pets around you, be aware of where they are. Review your medicines with your doctor. Some medicines can make you feel dizzy. This can increase your chance of falling. Ask your doctor what other things that you can do to help prevent falls. This information is not intended to replace advice given to you by your health care provider. Make sure you discuss any questions you have with your health care provider. Document Released: 08/24/2009 Document Revised: 04/04/2016 Document Reviewed: 12/02/2014 Elsevier Interactive Patient Education  2017 [employer].

## 2022-01-21 NOTE — Progress Notes (Incomplete)
Phone [phone] In person visit   Subjective:   [name] is a 76 y.o. year old very pleasant male patient who presents for/with See problem oriented charting No chief complaint on file.   This visit occurred during the SARS-CoV-2 public health emergency.  Safety protocols were in place, including screening questions prior to the visit, additional usage of staff PPE, and extensive cleaning of exam room while observing appropriate contact time as indicated for disinfecting solutions.   Past Medical History-  Patient Active Problem List   Diagnosis Date Noted   IDA (iron deficiency anemia) 10/08/2021   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/2021   Febrile neutropenia (HCC) 08/19/2021   Aortic atherosclerosis ([place]) 07/25/2021   Pneumonia of right lower lobe due to infectious organism    Neutropenic fever (HCC)    FUO (fever of unknown origin)    Pancytopenia ([place])    Positive blood culture 07/09/2021   Anticoagulants causing adverse effect in therapeutic use 06/22/2021   Encounter for therapeutic drug monitoring 09/21/2019   PTSD (post-traumatic stress disorder) 09/29/2018   BPPV (benign paroxysmal positional vertigo) 09/04/2017   CKD (chronic kidney disease), stage III ([place]) 08/11/2017   Former smoker 08/29/2016   Cervical spondylosis    S/P Bioprosthetic AVR (aortic valve replacement) in 2011 03/22/2015   History of pulmonary embolism 03/07/2015   Recurrent pulmonary embolism ([place]) 11/09/2014   Lung nodule 11/02/2014   Chronic lymphocytic leukemia ([place]) 11/11/2013   Migraine 10/08/2010   GERD 12/11/2009   MITRAL REGURGITATION 04/07/2009   Insomnia 02/03/2008   Anxiety state 09/06/2007   HLD (hyperlipidemia) 04/20/2007   Essential hypertension 04/20/2007   Atrial fibrillation ([place]) 04/20/2007   PROSTATE CANCER, HX OF 04/20/2007    Medications- reviewed and updated Current Outpatient Medications  Medication Sig Dispense Refill   acyclovir (ZOVIRAX) 400 MG  tablet Take 1 tablet (400 mg total) by mouth daily. 30 tablet 3   Cholecalciferol (VITAMIN D3) 50 MCG (2000 UT) capsule Take 2,000 Units by mouth every morning.     [employer] MOTION SICKNESS RELIEF 25 MG CHEW CHEW 1 TABLET EVERY DAY AS NEEDED FOR DIZZINESS 32 tablet 2   dexamethasone (DECADRON) 4 MG tablet Take 2 tablets (8 mg total) by mouth daily. Start the day after bendamustine chemotherapy for 2 days. Take with food. 30 tablet 1   docusate sodium (COLACE) 100 MG capsule Take 100 mg by mouth daily as needed (constipation).     ipratropium (ATROVENT) 0.03 % nasal spray Place 2 sprays into both nostrils every 12 (twelve) hours. (Patient taking differently: Place 2 sprays into both nostrils 2 (two) times daily as needed for rhinitis (congestion).) 30 mL 12   LORazepam (ATIVAN) 0.5 MG tablet Take 1 tablet (0.5 mg total) by mouth at bedtime as needed for anxiety (can take 1/4 or 1/2 tablet  if effective.). 30 tablet 2   omeprazole (PRILOSEC) 40 MG capsule Take 40 mg by mouth every evening.     ondansetron (ZOFRAN) 8 MG tablet Take 1 tablet (8 mg total) by mouth 2 (two) times daily as needed for refractory nausea / vomiting. Start on day 2 after bendamustine chemo. 30 tablet 1   prochlorperazine (COMPAZINE) 10 MG tablet Take 1 tablet (10 mg total) by mouth every 6 (six) hours as needed (Nausea or vomiting). 30 tablet 1   sertraline (ZOLOFT) 100 MG tablet Take 100 mg by mouth every morning.     sodium chloride (OCEAN) 0.65 % SOLN nasal spray Place 1 spray into both  nostrils as needed for congestion.     warfarin (COUMADIN) 5 MG tablet TAKE AS DIRECTED BY COUMADIN CLINIC 160 tablet 1   No current facility-administered medications for this visit.     Objective:  There were no vitals taken for this visit. Gen: NAD, resting comfortably CV: RRR no murmurs rubs or gallops Lungs: CTAB no crackles, wheeze, rhonchi Abdomen: soft/nontender/nondistended/normal bowel sounds. No rebound or guarding.  Ext: no  edema Skin: warm, dry Neuro: grossly normal, moves all extremities  ***    Assessment and Plan   ***new pup Dylan per [name] notes 7 months in july 2021. over a year january 2022.   #CLL-patient on venetoclax in addition to [place]-regular follow-up with oncology***  # Atrial fibrillation-follows with cardiology #Recurrent pulmonary embolism-on chronic Coumadin #Status post bioprosthetic AVR S: Rate controlled with *** Anticoagulated with Coumadin 5 mg -changed to this by cardiology due to thrombus on AVR Chadsvasc score of *** A/P: ***   #hypertension S: medication: none  -Hydrochlorothiazide 12.5 mg in the past Home readings #s: *** BP Readings from Last 3 Encounters:  01/17/22 127/76  01/11/22 120/68  01/07/22 92/62  A/P: ***   #Chronic kidney disease stage III S: GFR is typically in the 40s *** range -Patient knows to avoid NSAIDs***  A/P: ***   #hyperlipidemia/aortic atherosclerosis S: Medication: Crestor 10 mg daily Lab Results  Component Value Date   CHOL 170 05/18/2021   HDL 35.80 (L) 05/18/2021   LDLCALC 110 (H) 05/12/2020   LDLDIRECT 95.0 05/18/2021   TRIG 238.0 (H) 05/18/2021   CHOLHDL 5 05/18/2021   A/P: ***  # Anxiety/PTSD S:Medication: Sertraline 100 mg, Ativan 0.5 mg-able to cut in half and use-about 20 Still-knew to avoid driving for 8 hours after use***  Counseling: Continues to work with therapist Dr. Cheryln Manly A/P: ***   # GERD S:Medication: Prilosec 40 mg - seen ENT in the past who recommended cutting back on coffee and spicy foods B12 levels related to PPI use: Lab Results  Component Value Date   [id number] 05/18/2021   A/P: ***    Health Maintenance Due  Topic Date Due   Zoster Vaccines- Shingrix (2 of 2) 04/15/2021   Recommended follow up: No follow-ups on file. Future Appointments  Date Time Provider [place]  01/21/2022  1:00 PM Willette Brace, LPN [facility]  07/13/4096  1:00 PM Oren Binet, PhD [facility] None  01/25/2022 11:15 AM [facility]  01/31/2022  8:45 AM [facility] None  01/31/2022  9:00 AM Ennever, Rudell Cobb, MD [facility] None  01/31/2022  9:30 AM [facility] A2 [facility] None  02/01/2022 12:00 PM [facility] B1 [facility] None  02/21/2022  1:00 PM Oren Binet, PhD [facility] None  03/11/2022 11:00 AM [name], MD [facility]  03/21/2022  1:00 PM Oren Binet, PhD [facility] None  04/18/2022  1:00 PM Oren Binet, PhD [facility] None  04/25/2022  9:30 AM Josue Hector, MD [facility]  05/16/2022  1:00 PM Oren Binet, PhD [facility] None    Lab/Order associations: No diagnosis found.  No orders of the defined types were placed in this encounter.   Return precautions advised.  Burnett Corrente

## 2022-01-21 NOTE — Progress Notes (Signed)
Subjective:   [name] is a 76 y.o. male who presents for Medicare Annual/Subsequent preventive examination.  Review of Systems     Cardiac Risk Factors include: advanced age (>44men, >7 women);hypertension;dyslipidemia;male gender     Objective:    Today's Vitals   01/21/22 1253  BP: 120/72  Pulse: 77  Temp: 97.7 F (36.5 C)  SpO2: 99%  Weight: 171 lb 6.4 oz (77.7 kg)   Body mass index is 24.59 kg/m.  Advanced Directives 01/21/2022 01/17/2022 12/26/2021 12/20/2021 11/16/2021 11/14/2021 11/07/2021  Does Patient Have a Medical Advance Directive? Yes Yes Yes Yes Yes Yes Yes  Type of Paramedic of [place];Living will [place];Living will [place];Living will Living will;Healthcare Power of [place];Living will [place];Living will  Does patient want to make changes to medical advance directive? - No - Patient declined - No - Patient declined No - Patient declined No - Patient declined No - Patient declined  Copy of [place] in Chart? No - copy requested No - copy requested No - copy requested No - copy requested - No - copy requested No - copy requested  Would patient like information on creating a medical advance directive? - No - Patient declined No - Patient declined No - Patient declined - - -    Current Medications (verified) Outpatient Encounter Medications as of 01/21/2022  Medication Sig   Cholecalciferol (VITAMIN D3) 50 MCG (2000 UT) capsule Take 2,000 Units by mouth every morning.   [employer] MOTION SICKNESS RELIEF 25 MG CHEW CHEW 1 TABLET EVERY DAY AS NEEDED FOR DIZZINESS   docusate sodium (COLACE) 100 MG capsule Take 100 mg by mouth daily as needed (constipation).   ipratropium (ATROVENT) 0.03 % nasal spray Place 2 sprays into both nostrils every 12 (twelve) hours. (Patient taking differently: Place 2 sprays into both  nostrils 2 (two) times daily as needed for rhinitis (congestion).)   LORazepam (ATIVAN) 0.5 MG tablet Take 1 tablet (0.5 mg total) by mouth at bedtime as needed for anxiety (can take 1/4 or 1/2 tablet  if effective.).   omeprazole (PRILOSEC) 40 MG capsule Take 40 mg by mouth every evening.   ondansetron (ZOFRAN) 8 MG tablet Take 1 tablet (8 mg total) by mouth 2 (two) times daily as needed for refractory nausea / vomiting. Start on day 2 after bendamustine chemo.   sertraline (ZOLOFT) 100 MG tablet Take 100 mg by mouth every morning.   sodium chloride (OCEAN) 0.65 % SOLN nasal spray Place 1 spray into both nostrils as needed for congestion.   warfarin (COUMADIN) 5 MG tablet TAKE AS DIRECTED BY COUMADIN CLINIC   acyclovir (ZOVIRAX) 400 MG tablet Take 1 tablet (400 mg total) by mouth daily. (Patient not taking: Reported on 01/21/2022)   prochlorperazine (COMPAZINE) 10 MG tablet Take 1 tablet (10 mg total) by mouth every 6 (six) hours as needed (Nausea or vomiting). (Patient not taking: Reported on 01/21/2022)   [DISCONTINUED] dexamethasone (DECADRON) 4 MG tablet Take 2 tablets (8 mg total) by mouth daily. Start the day after bendamustine chemotherapy for 2 days. Take with food.   No facility-administered encounter medications on file as of 01/21/2022.    Allergies (verified) Hydrocodone-acetaminophen, Oxycodone hcl, Telmisartan-hctz, Aspirin, Atorvastatin, Buspirone hcl, Codeine, Morphine sulfate, Paroxetine, Rabeprazole, and Ramipril   History: Past Medical History:  Diagnosis Date   Anticoagulants causing adverse effect in therapeutic use  Anxiety    Paxil seemed to cause odd neuro side effects; better on prozac as of 09/2015   Aortic regurgitation 04/2007 surgery   Resolved [facility]   Cervical spondylosis    ESI by [name] in [place]. 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 (most recent  07/2017)   History of prostate cancer 2003   Rad prost   History of pulmonary embolism 2011; 10/2014   Recurrence off of anticoagulation 10/2014: per hem/onc ([name]) pt needs lifelong anticoagulation (hypercoag w/u neg).   Hyperlipidemia    statin-intolerant, except for crestor low dose.   Hypertension    Migraine syndrome    Mitral regurgitation    PAF (paroxysmal atrial fibrillation) (HCC)    Panic attacks    "           "             "                  "                  "                       "                             "                          Pulmonary nodule, right 2015   RLL: resected at [facility]; Benign RLL nodule (fungal and AFB stains neg).  Plan is for [facility] thoracic surg to do f/u o/v & CT chest 1 yr    Right-sided headache 07/2016   Primary stabbing HA or migraine per neuro (Dr. Tomi Likens).  Gabapentin and topamax not tolerated.  These resolved spontaneously.   Past Surgical History:  Procedure Laterality Date   AORTIC VALVE REPLACEMENT  2011   [place]  (bioprosthetic)   CARDIAC CATHETERIZATION  04/2010   Normal coronaries.   Carotid dopplers  08/2015   NORMAL   CATARACT EXTRACTION     L 12/06/09   R 09/14/09   COLONOSCOPY  01/26/13   tic's, o/w normal.  ([facility])--recall 10 yrs.   PENILE PROSTHESIS IMPLANT     PROSTATECTOMY  04/2002   for prostate cancer   Pulmonary nodule resection  Fall/winter 2016   [facility]   SHOULDER SURGERY     rotator cuff on both arms    TEE WITHOUT CARDIOVERSION N/A 07/13/2021   Procedure: TRANSESOPHAGEAL ECHOCARDIOGRAM (TEE);  Surgeon: Pixie Casino, MD;  Location: [facility] ENDOSCOPY;  Service: Cardiovascular;  Laterality: N/A;   TIBIALIS TENDON TRANSFER / REPAIR Right 03/02/2019   Procedure: RECONSTRUCTION OF RIGHT  ANTERIOR TIBIALIS TENDON;  Surgeon: Erle Crocker, MD;  Location: [facility];  Service: Orthopedics;  Laterality: Right;   TRANSTHORACIC ECHOCARDIOGRAM  09/20/2014; 03/2016; 03/2017   2015-mild LVH, EF 50-55%,  wall motion nl, grade I diast dysfxn, prosth aort valve good,transaortic gradients decreased compared to echo 11/2013.   03/2016: EF 55-60%, normal LV function, severe LVH, normal diast fxn, mild increase in AV gradient compared to 09/2014 echo.  2018: EF 55-60%, normal LV fxn, grd II DD, AV stable/gradient's stable.   Family History  Problem Relation  Age of Onset   Cancer Sister        uterine   Colon cancer Sister 101   Stroke Mother    Prostate cancer Father    Stroke Sister    Stroke Brother    Heart attack Neg Hx    Social History   Socioeconomic History   Marital status: Single    Spouse name: Not on file   Number of children: Not on file   Years of education: Not on file   Highest education level: Not on file  Occupational History    Comment: Retired - Tobacco   Tobacco Use   Smoking status: Former    Packs/day: 1.00    Years: 12.00    Pack years: 12.00    Types: Cigarettes    Start date: 11/28/1968    Quit date: 01/29/1981    Years since quitting: 41.0   Smokeless tobacco: Never   Tobacco comments:    quit 76 yo   Vaping Use   Vaping Use: Never used  Substance and Sexual Activity   Alcohol use: Not Currently    Comment: occasional   Drug use: No   Sexual activity: Not on file  Other Topics Concern   Not on file  Social History Narrative   Single. No children. Lives alone      Retired from [employer].       Hobbies: travel, Scientist, product/process development, enjoys being outdoors including hiking   Social Determinants of Radio broadcast assistant Strain: Low Risk    Difficulty of Paying Living Expenses: Not hard at all  Food Insecurity: No Food Insecurity   Worried About Charity fundraiser in the Last Year: Never true   Arboriculturist in the Last Year: Never true  Transportation Needs: No Transportation Needs   Lack of Transportation (Medical): No   Lack of Transportation (Non-Medical): No  Physical Activity: Sufficiently Active   Days of  Exercise per Week: 5 days   Minutes of Exercise per Session: 60 min  Stress: No Stress Concern Present   Feeling of Stress : Not at all  Social Connections: Moderately Isolated   Frequency of Communication with Friends and Family: More than three times a week   Frequency of Social Gatherings with Friends and Family: More than three times a week   Attends Religious Services: Never   Marine scientist or Organizations: Yes   Attends Music therapist: 1 to 4 times per year   Marital Status: Never married    Tobacco Counseling Counseling given: Not Answered Tobacco comments: quit 76 yo    Clinical Intake:  Pre-visit preparation completed: Yes  Pain : No/denies pain     BMI - recorded: 24.59 Nutritional Status: BMI of 19-24  Normal Nutritional Risks: None Diabetes: No  How often do you need to have someone help you when you read instructions, pamphlets, or other written materials from your doctor or pharmacy?: 1 - Never  Diabetic?no  Interpreter Needed?: No  Information entered by :: Charlott Rakes, LPN   Activities of Daily Living In your present state of health, do you have any difficulty performing the following activities: 01/21/2022  Hearing? N  Vision? N  Difficulty concentrating or making decisions? N  Walking or climbing stairs? N  Dressing or bathing? N  Doing errands, shopping? N  Preparing Food and eating ? N  Using the Toilet? N  In the past six months, have you  accidently leaked urine? N  Do you have problems with loss of bowel control? N  Managing your Medications? N  Managing your Finances? N  Housekeeping or managing your Housekeeping? N  Some recent data might be hidden    Patient Care Team: [name], MD as PCP - General (Family Medicine) Josue Hector, MD as PCP - Cardiology (Cardiology) Ladell Pier, MD as Consulting Physician (Oncology) Josue Hector, MD as Consulting Physician (Cardiology) Chesley Mires,  MD as Consulting Physician (Pulmonary Disease) Volanda Napoleon, MD as Consulting Physician (Oncology) Pieter Partridge, DO as Consulting Physician (Neurology) Everitt Amber, MD as Consulting Physician (Ophthalmology) Rutherford Guys, MD as Consulting Physician (Ophthalmology)  Indicate any recent Medical Services you may have received from other than [facility] providers in the past year (date may be approximate).     Assessment:   This is a routine wellness examination for Oswaldo.  Hearing/Vision screen Hearing Screening - Comments:: Pt denies any heating  issues  Vision Screening - Comments:: Pt encouraged to follow up with eye provider   Dietary issues and exercise activities discussed: Current Exercise Habits: Home exercise routine, Type of exercise: Other - see comments, Time (Minutes): 60, Frequency (Times/Week): 5, Weekly Exercise (Minutes/Week): 300   Goals Addressed             This Visit's Progress    Patient Stated       Beat the cancer!!       Depression Screen PHQ 2/9 Scores 01/21/2022 09/05/2021 06/26/2021 05/18/2021 02/09/2021 01/15/2021 11/13/2020  PHQ - 2 Score 0 0 0 0 0 0 0  PHQ- 9 Score - - - - - - -    Fall Risk Fall Risk  01/21/2022 09/05/2021 07/25/2021 06/26/2021 05/18/2021  Falls in the past year? 0 0 0 0 1  Number falls in past yr: 0 0 - 0 0  Injury with Fall? 0 0 - 0 0  Risk for fall due to : Impaired balance/gait;Impaired vision;Impaired mobility - - No Fall Risks No Fall Risks  Follow up Falls prevention discussed Falls evaluation completed - Falls evaluation completed Falls evaluation completed    [place]:  Any stairs in or around the home? Yes  If so, are there any without handrails? No  Home free of loose throw rugs in walkways, pet beds, electrical cords, etc? Yes  Adequate lighting in your home to reduce risk of falls? Yes   ASSISTIVE DEVICES UTILIZED TO PREVENT FALLS:  Life alert? No  Use of a cane, walker or w/c?  No  Grab bars in the bathroom? Yes  Shower chair or bench in shower? Yes  Elevated toilet seat or a handicapped toilet? No   TIMED UP AND GO:  Was the test performed? Yes .  Length of time to ambulate 10 feet: 10 sec.   Gait steady and fast without use of assistive device  Cognitive Function: MMSE - Mini Mental State Exam 02/21/2017  Not completed: (No Data)     6CIT Screen 01/21/2022 01/15/2021  What Year? 0 points 0 points  What month? 0 points 0 points  What time? 0 points -  Count back from 20 0 points 0 points  Months in reverse 0 points 0 points  Repeat phrase 4 points 2 points  Total Score 4 -    Immunizations Immunization History  Administered Date(s) Administered   Fluad Quad(high Dose 65+) 09/24/2020, 07/25/2021   Influenza Split 08/16/2011, 08/14/2012, 07/13/2015, 09/11/2016  Influenza Whole 08/17/2010   Influenza, High Dose Seasonal PF 08/23/2013, 08/29/2016, 08/11/2017, 08/11/2018, 06/24/2019   Influenza,inj,Quad PF,6+ Mos 09/05/2014, 08/02/2015   Influenza-Unspecified 06/29/2018, 08/11/2020   PFIZER Comirnaty(Gray Top)Covid-19 Tri-Sucrose Vaccine 02/26/2021   PFIZER(Purple Top)SARS-COV-2 Vaccination 12/12/2019, 01/02/2020, 08/03/2020   Pfizer Covid-19 Vaccine Bivalent Booster 27yrs & up 09/20/2021   Pneumococcal Conjugate-13 01/23/2015, 08/12/2015   Pneumococcal Polysaccharide-23 03/15/2011, 12/20/2020   Td 04/16/2007   Tdap 04/14/2018, 12/20/2020   Zoster Recombinat (Shingrix) 02/18/2021   Zoster, Live 08/16/2011    TDAP status: Up to date  Flu Vaccine status: Up to date  Pneumococcal vaccine status: Up to date  Covid-19 vaccine status: Completed vaccines  Qualifies for Shingles Vaccine? Yes   Zostavax completed Yes   Shingrix Completed?: Yes  Screening Tests Health Maintenance  Topic Date Due   Zoster Vaccines- Shingrix (2 of 2) 04/23/2022 (Originally 04/15/2021)   COLONOSCOPY (Pts 45-34yrs Insurance coverage will need to be confirmed)   01/27/2023   TETANUS/TDAP  12/20/2030   Pneumonia Vaccine 71+ Years old  Completed   INFLUENZA VACCINE  Completed   COVID-19 Vaccine  Completed   Hepatitis C Screening  Completed   HPV VACCINES  Aged Out    Health Maintenance  There are no preventive care reminders to display for this patient.   Colorectal cancer screening: Type of screening: Colonoscopy. Completed 01/26/13. Repeat every 10 years  Additional Screening:  Hepatitis C Screening:  Completed 07/10/21  Vision Screening: Recommended annual ophthalmology exams for early detection of glaucoma and other disorders of the eye. Is the patient up to date with their annual eye exam?  No  Who is the provider or what is the name of the office in which the patient attends annual eye exams? Encouraged to follow up with provider  If pt is not established with a provider, would they like to be referred to a provider to establish care? No .   Dental Screening: Recommended annual dental exams for proper oral hygiene  Community Resource Referral / Chronic Care Management: CRR required this visit?  No   CCM required this visit?  No      Plan:     I have personally reviewed and noted the following in the patients chart:   Medical and social history Use of alcohol, tobacco or illicit drugs  Current medications and supplements including opioid prescriptions. Patient is not currently taking opioid prescriptions. Functional ability and status Nutritional status Physical activity Advanced directives List of other physicians Hospitalizations, surgeries, and ER visits in previous 12 months Vitals Screenings to include cognitive, depression, and falls Referrals and appointments  In addition, I have reviewed and discussed with patient certain preventive protocols, quality metrics, and best practice recommendations. A written personalized care plan for preventive services as well as general preventive health recommendations were  provided to patient.     Willette Brace, LPN   [id number]   Nurse Notes: None

## 2022-01-24 ENCOUNTER — Encounter: Payer: Self-pay | Admitting: Hematology & Oncology

## 2022-01-24 ENCOUNTER — Ambulatory Visit (INDEPENDENT_AMBULATORY_CARE_PROVIDER_SITE_OTHER): Payer: Medicare Other | Admitting: Psychology

## 2022-01-24 DIAGNOSIS — F4323 Adjustment disorder with mixed anxiety and depressed mood: Secondary | ICD-10-CM

## 2022-01-24 NOTE — Progress Notes (Signed)
Rapid Infusion Rituximab Pharmacist Evaluation ? ?[name] is a 76 y.o. male being treated with rituximab for NHL. This patient may be considered for RIR.  ? ?A pharmacist has verified the patient tolerated rituximab infusions per the [facility] standard infusion protocol without grade 3-4 infusion reactions. The treatment plan will be updated to reflect RIR if the patient qualifies per the checklist below:  ? ?Age > 75 years old Yes   ?Clinically significant cardiovascular disease EF 55-60  ?Circulating lymphocyte count < 5000/uL prior to cycle two Yes  ?Lab Results  ?Component Value Date  ? LYMPHSABS 0.6 (L) 01/17/2022  ?  ?Prior documented grade 3-4 infusion reaction to rituximab No   ?Prior documented grade 1-2 infusion reaction to rituximab (If YES, Pharmacist will confirm with Physician if patient is still a candidate for RIR) No   ?Previous rituximab infusion within the past 6 months Yes   ?Treatment Plan updated orders to reflect RIR Yes   ? ?[name] does meet the criteria for Rapid Infusion Rituximab. This patient is going to be switched to rapid infusion rituximab.  ? ?[name], Jacqlyn Larsen ?01/24/22 11:34 AM  ?

## 2022-01-24 NOTE — Progress Notes (Signed)
? ? ? ? ? ? ? ? ? ? ? ? ? ? ? ? ? ?Date: 01/24/2022  ?Treatment Plan: ?Diagnosis ?F99 (Unspecified mental disorder) [n/a]  ?309.28 (Adjustment disorder with mixed anxiety and depressed mood) [n/a]  ?Symptoms ?Depressed or irritable mood. (Status: maintained) -- No Description Entered  ?Diminished interest in or enjoyment of activities. (Status: maintained) -- No Description Entered  ?Experiences disturbing and persistent thoughts, images, and/or perceptions of the traumatic event. (Status: maintained) -- No Description Entered  ?Has been exposed to a traumatic event involving actual or perceived threat of death or serious injury. (Status: maintained) -- No Description Entered  ?Intentionally avoids thoughts, feelings, or discussions related to the traumatic event. (Status: maintained) -- No Description Entered  ?Lack of energy. (Status: maintained) -- No Description Entered  ?Sad affect, social withdrawal, anxiety, loss of interest in activities, and low energy. (Status: maintained) -- No Description Entered  ?Symptoms present more than one month. (Status: maintained) -- No Description Entered  ?Medication Status ?compliance  ?Safety ?none  ?If Suicidal or Homicidal State Action Taken: unspecified  ?Current Risk: low ?Medications ?Lorazepam (Dosage: .'5mg'$ )  ?Meclizine (Dosage: unknown)  ?Sertrline (Dosage: '100mg'$ )  ?Xarelto (Dosage: unknown)  ?Objectives ?Related Problem: Develop healthy thinking patterns and beliefs about self, others, and the world that lead to the alleviation and help prevent the relapse of depression. ?Description: Implement mindfulness techniques for relapse prevention. ?Target Date: 2022-07-08 ?Frequency: Daily ?Modality: individual ?Progress: 50% ? ?Related Problem: Develop healthy thinking patterns and beliefs about self, others, and the world that lead to the alleviation and help prevent the relapse of depression. ?Description: Learn and implement conflict resolution skills to resolve  interpersonal problems. ?Target Date: 2022-07-08 ?Frequency: Daily ?Modality: individual ?Progress: 60% ? ?Related Problem: Develop healthy thinking patterns and beliefs about self, others, and the world that lead to the alleviation and help prevent the relapse of depression. ?Description: Learn and implement behavioral strategies to overcome depression. ?Target Date: 2022-07-08 ?Frequency: Daily ?Modality: individual ?Progress: 50%  ?Related Problem: Develop healthy thinking patterns and beliefs about self, others, and the world that lead to the alleviation and help prevent the relapse of depression. ?Description: Describe current and past experiences with depression including their impact on functioning and attempts to resolve it. ?Target Date: 2022-07-08 ?Frequency: Daily ?Modality: individual ?Progress: 60% ? ?Client Response ?full compliance  ?Service Location ?Location, [address] [place], [place] [address]  ?Service Code ?cpt [id number]  ?Self care activities  ?Emotion regulation skills  ?Distress tolerance skill  ?Validate/empathize  ?Identified an insight  ?Facilitate problem solving  ?Normalize/Reframe  ?Comments  ?Dx: G81.85  ?[name] agreed to having a video session due to the pandemic. He was at home and I was in my home office.  ?Meds: Sertraline ('100mg'$ ), Vit. D, Meclizine, blood pressure med, cholesterol med, Lorazepam (.5 mg as needed).  ?Goals: Seeking relief to depressive feelings of sadness and lack of motivation/interest in activities. Also, has some anxieties related to health and his own mortality. Goal Date: 06-11-22  ? ?[name] says he is feeling "dramatically better" and thinks it is due to the chemo. He says his only issue is that he has lost muscle mass. He says that, for the first time, he is not wanting to just die, but also isn't fearful of his mortality. He is, for the first time in a long while, looking to plan another trip. He has to do something less physically demanding. He states that he  is of the mindset that he  is "going to get better". His appetite is finally back, but he tends to eat too much.            ? ?[name], PhD  Time: 1:10-2:00 50 minutes ? ? ? ? ? ? ? ? ? ? ? ? ? ? ? ?

## 2022-01-25 ENCOUNTER — Other Ambulatory Visit: Payer: Self-pay

## 2022-01-25 ENCOUNTER — Ambulatory Visit (INDEPENDENT_AMBULATORY_CARE_PROVIDER_SITE_OTHER): Payer: Medicare Other | Admitting: *Deleted

## 2022-01-25 DIAGNOSIS — I2699 Other pulmonary embolism without acute cor pulmonale: Secondary | ICD-10-CM

## 2022-01-25 DIAGNOSIS — Z5181 Encounter for therapeutic drug level monitoring: Secondary | ICD-10-CM

## 2022-01-25 DIAGNOSIS — Z952 Presence of prosthetic heart valve: Secondary | ICD-10-CM

## 2022-01-25 DIAGNOSIS — I4891 Unspecified atrial fibrillation: Secondary | ICD-10-CM | POA: Diagnosis not present

## 2022-01-25 LAB — POCT INR: INR: 1.8 — AB (ref 2.0–3.0)

## 2022-01-25 NOTE — Patient Instructions (Signed)
Description   ?Today take 2 tablets warfarin tonight, then start taking Warfarin 2 tablets daily except for 1.5 tablets on Mondays, Wednesdays, and Fridays. Recheck INR in 1 week.  ?Please call coumadin clinic with questions at [phone].  ?  ?  ?

## 2022-01-28 ENCOUNTER — Encounter: Payer: Self-pay | Admitting: Cardiovascular Disease

## 2022-01-29 ENCOUNTER — Encounter: Payer: Self-pay | Admitting: Hematology & Oncology

## 2022-01-31 ENCOUNTER — Other Ambulatory Visit: Payer: Self-pay

## 2022-01-31 ENCOUNTER — Encounter: Payer: Self-pay | Admitting: Hematology & Oncology

## 2022-01-31 ENCOUNTER — Inpatient Hospital Stay: Payer: No Typology Code available for payment source

## 2022-01-31 ENCOUNTER — Inpatient Hospital Stay (HOSPITAL_BASED_OUTPATIENT_CLINIC_OR_DEPARTMENT_OTHER): Payer: No Typology Code available for payment source | Admitting: Hematology & Oncology

## 2022-01-31 VITALS — BP 118/63 | HR 71

## 2022-01-31 VITALS — BP 112/66 | HR 76 | Temp 98.4°F | Resp 18 | Ht 70.0 in | Wt 178.1 lb

## 2022-01-31 DIAGNOSIS — C9110 Chronic lymphocytic leukemia of B-cell type not having achieved remission: Secondary | ICD-10-CM | POA: Diagnosis not present

## 2022-01-31 DIAGNOSIS — Z5111 Encounter for antineoplastic chemotherapy: Secondary | ICD-10-CM | POA: Diagnosis not present

## 2022-01-31 LAB — CMP (CANCER CENTER ONLY)
ALT: 11 U/L (ref 0–44)
AST: 16 U/L (ref 15–41)
Albumin: 4 g/dL (ref 3.5–5.0)
Alkaline Phosphatase: 87 U/L (ref 38–126)
Anion gap: 7 (ref 5–15)
BUN: 21 mg/dL (ref 8–23)
CO2: 28 mmol/L (ref 22–32)
Calcium: 9.5 mg/dL (ref 8.9–10.3)
Chloride: 104 mmol/L (ref 98–111)
Creatinine: 1.28 mg/dL — ABNORMAL HIGH (ref 0.61–1.24)
GFR, Estimated: 58 mL/min — ABNORMAL LOW (ref >60)
Glucose, Bld: 67 mg/dL — ABNORMAL LOW (ref 70–99)
Potassium: 4.8 mmol/L (ref 3.5–5.1)
Sodium: 139 mmol/L (ref 135–145)
Total Bilirubin: 0.4 mg/dL (ref 0.3–1.2)
Total Protein: 6.1 g/dL — ABNORMAL LOW (ref 6.5–8.1)

## 2022-01-31 LAB — CBC WITH DIFFERENTIAL (CANCER CENTER ONLY)
Abs Immature Granulocytes: 0.02 10*3/uL (ref 0.00–0.07)
Basophils Absolute: 0 10*3/uL (ref 0.0–0.1)
Basophils Relative: 1 %
Eosinophils Absolute: 0.1 10*3/uL (ref 0.0–0.5)
Eosinophils Relative: 4 %
HCT: 32.8 % — ABNORMAL LOW (ref 39.0–52.0)
Hemoglobin: 10.8 g/dL — ABNORMAL LOW (ref 13.0–17.0)
Immature Granulocytes: 1 %
Lymphocytes Relative: 18 %
Lymphs Abs: 0.5 10*3/uL — ABNORMAL LOW (ref 0.7–4.0)
MCH: 31.4 pg (ref 26.0–34.0)
MCHC: 32.9 g/dL (ref 30.0–36.0)
MCV: 95.3 fL (ref 80.0–100.0)
Monocytes Absolute: 0.7 10*3/uL (ref 0.1–1.0)
Monocytes Relative: 24 %
Neutro Abs: 1.5 10*3/uL — ABNORMAL LOW (ref 1.7–7.7)
Neutrophils Relative %: 52 %
Platelet Count: 125 10*3/uL — ABNORMAL LOW (ref 150–400)
RBC: 3.44 MIL/uL — ABNORMAL LOW (ref 4.22–5.81)
RDW: 17.1 % — ABNORMAL HIGH (ref 11.5–15.5)
WBC Count: 2.9 10*3/uL — ABNORMAL LOW (ref 4.0–10.5)
nRBC: 0 % (ref 0.0–0.2)

## 2022-01-31 LAB — LACTATE DEHYDROGENASE: LDH: 252 U/L — ABNORMAL HIGH (ref 98–192)

## 2022-01-31 MED ORDER — SODIUM CHLORIDE 0.9 % IV SOLN
64.8000 mg/m2 | Freq: Once | INTRAVENOUS | Status: AC
  Administered 2022-01-31: 125 mg via INTRAVENOUS
  Filled 2022-01-31: qty 5

## 2022-01-31 MED ORDER — SODIUM CHLORIDE 0.9 % IV SOLN
10.0000 mg | Freq: Once | INTRAVENOUS | Status: AC
  Administered 2022-01-31: 10 mg via INTRAVENOUS
  Filled 2022-01-31: qty 10

## 2022-01-31 MED ORDER — DIPHENHYDRAMINE HCL 25 MG PO CAPS
50.0000 mg | ORAL_CAPSULE | Freq: Once | ORAL | Status: AC
  Administered 2022-01-31: 50 mg via ORAL
  Filled 2022-01-31: qty 2

## 2022-01-31 MED ORDER — ACETAMINOPHEN 325 MG PO TABS
650.0000 mg | ORAL_TABLET | Freq: Once | ORAL | Status: AC
  Administered 2022-01-31: 650 mg via ORAL
  Filled 2022-01-31: qty 2

## 2022-01-31 MED ORDER — SODIUM CHLORIDE 0.9 % IV SOLN
375.0000 mg/m2 | Freq: Once | INTRAVENOUS | Status: AC
  Administered 2022-01-31: 700 mg via INTRAVENOUS
  Filled 2022-01-31: qty 50

## 2022-01-31 MED ORDER — SODIUM CHLORIDE 0.9 % IV SOLN: Freq: Once | INTRAVENOUS | Status: AC

## 2022-01-31 MED ORDER — PALONOSETRON HCL INJECTION 0.25 MG/5ML
0.2500 mg | Freq: Once | INTRAVENOUS | Status: AC
  Administered 2022-01-31: 0.25 mg via INTRAVENOUS
  Filled 2022-01-31: qty 5

## 2022-01-31 NOTE — Progress Notes (Signed)
?Hematology and Oncology Follow Up Visit ? ?[name] ?[id number] ?19-Jan-1946 76 y.o. ?01/31/2022 ? ? ?Principle Diagnosis:  ?Right lower lobe pulmonary nodule- non-malignant ?Recurrent pulmonary embolism ?CLL -- progressive -- 11q-/13q- ?Chronic neutropenia secondary to chemotherapy/CLL ? ?Past Therapy: ?Calquence 100 mg po BID -- start on 03/13/2020 -- d/c on 07/05/2021 ?Venetoclax 100 mg po q day -- change 07/05/2021 -- d/c on 07/05/2021 ?  ?Current Therapy:   ?Rituxan/Bendamustine -- s/p cycle #1 -- start on 01/03/2022     ?Coumadin 5 mg po q day ?Ziextenzo 6 mg SQ as indicated for low WBC count/ANC ?  ?Interim History:  [name] is here today for follow-up.  He is still doing quite well.  He really has no specific complaints.  He has been more active.  He is eating well.  He has had no nausea or vomiting.  There is no change in bowel or bladder habits.  He has had no rashes.  He has had no bleeding. ? ?He has not noted any adenopathy. ? ?He has had no fever. ? ?There is been no problems with his heart.  He is on Coumadin. ? ?There is been no leg swelling.  He has had no headache. ? ?I am happy that he is able to do more. ? ?Overall, I would say his performance status is ECOG 1.   ?      ? ?Medications:  ?Allergies as of 01/31/2022   ? ?   Reactions  ? Hydrocodone-acetaminophen Shortness Of Breath  ? Oxycodone Hcl Shortness Of Breath  ? Telmisartan-hctz Shortness Of Breath, Palpitations, Other (See Comments)  ? Dizziness also  ? Aspirin Rash, Other (See Comments)  ? Confusion, also- Cannot take high doses  ? Atorvastatin Other (See Comments)  ? Myalgia  ? Buspirone Hcl Other (See Comments)  ? Headaches  ? Codeine Hives, Nausea And Vomiting, Other (See Comments)  ? Confusion also  ? Morphine Sulfate Itching  ? Paroxetine Other (See Comments)  ? Paresthesias: R arm, R leg, r side of face  ? Rabeprazole Other (See Comments)  ? headache  ? Ramipril Other (See Comments)  ? Unknown reaction  ? ?  ? ?  ?Medication  List  ?  ? ?  ? Accurate as of January 31, 2022  9:30 AM. If you have any questions, ask your nurse or doctor.  ?  ?  ? ?  ? ?acyclovir 400 MG tablet ?Commonly known as: ZOVIRAX ?Take 1 tablet (400 mg total) by mouth daily. ?  ?[employer] Sickness Relief 25 MG Chew ?Generic drug: Meclizine HCl ?CHEW 1 TABLET EVERY DAY AS NEEDED FOR DIZZINESS ?  ?docusate sodium 100 MG capsule ?Commonly known as: COLACE ?Take 100 mg by mouth daily as needed (constipation). ?  ?ipratropium 0.03 % nasal spray ?Commonly known as: ATROVENT ?Place 2 sprays into both nostrils every 12 (twelve) hours. ?What changed:  ?when to take this ?reasons to take this ?  ?LORazepam 0.5 MG tablet ?Commonly known as: ATIVAN ?Take 1 tablet (0.5 mg total) by mouth at bedtime as needed for anxiety (can take 1/4 or 1/2 tablet  if effective.). ?  ?omeprazole 40 MG capsule ?Commonly known as: PRILOSEC ?Take 40 mg by mouth every evening. ?  ?ondansetron 8 MG tablet ?Commonly known as: Zofran ?Take 1 tablet (8 mg total) by mouth 2 (two) times daily as needed for refractory nausea / vomiting. Start on day 2 after bendamustine chemo. ?  ?prochlorperazine 10 MG tablet ?Commonly known  as: COMPAZINE ?Take 1 tablet (10 mg total) by mouth every 6 (six) hours as needed (Nausea or vomiting). ?  ?sertraline 100 MG tablet ?Commonly known as: ZOLOFT ?Take 100 mg by mouth every morning. ?  ?sodium chloride 0.65 % Soln nasal spray ?Commonly known as: OCEAN ?Place 1 spray into both nostrils as needed for congestion. ?  ?Vitamin D3 50 MCG (2000 UT) capsule ?Take 2,000 Units by mouth every morning. ?  ?warfarin 5 MG tablet ?Commonly known as: COUMADIN ?Take as directed by the anticoagulation clinic. If you are unsure how to take this medication, talk to your nurse or doctor. ?Original instructions: TAKE AS DIRECTED BY COUMADIN CLINIC ?  ? ?  ? ? ?Allergies:  ?Allergies  ?Allergen Reactions  ? Hydrocodone-Acetaminophen Shortness Of Breath  ? Oxycodone Hcl Shortness Of Breath  ?  Telmisartan-Hctz Shortness Of Breath, Palpitations and Other (See Comments)  ?  Dizziness also  ? Aspirin Rash and Other (See Comments)  ?  Confusion, also- Cannot take high doses  ? Atorvastatin Other (See Comments)  ?  Myalgia ?  ? Buspirone Hcl Other (See Comments)  ?  Headaches ?  ? Codeine Hives, Nausea And Vomiting and Other (See Comments)  ?  Confusion also  ? Morphine Sulfate Itching  ? Paroxetine Other (See Comments)  ?  Paresthesias: R arm, R leg, r side of face  ? Rabeprazole Other (See Comments)  ?  headache  ? Ramipril Other (See Comments)  ?  Unknown reaction  ? ? ?Past Medical History, Surgical history, Social history, and Family History were reviewed and updated. ? ?Review of Systems: ?. ?Review of Systems  ?Constitutional:  Positive for malaise/fatigue.  ?HENT: Negative.    ?Eyes: Negative.   ?Respiratory:  Positive for shortness of breath.   ?Cardiovascular:  Positive for palpitations.  ?Gastrointestinal:  Positive for nausea.  ?Genitourinary: Negative.   ?Musculoskeletal: Negative.   ?Skin: Negative.   ?Neurological: Negative.   ?Endo/Heme/Allergies: Negative.   ?Psychiatric/Behavioral: Negative.    ? ?Physical Exam: ? height is '5\' 10"'$  (1.778 m) and weight is 178 lb 1.9 oz (80.8 kg). His oral temperature is 98.4 ?F (36.9 ?C). His blood pressure is 112/66 and his pulse is 76. His respiration is 18 and oxygen saturation is 100%.  ? ?Wt Readings from Last 3 Encounters:  ?01/31/22 178 lb 1.9 oz (80.8 kg)  ?01/21/22 171 lb 6.4 oz (77.7 kg)  ?01/17/22 169 lb (76.7 kg)  ? ? ?Physical Exam ?Vitals reviewed.  ?HENT:  ?   Head: Normocephalic and atraumatic.  ?Eyes:  ?   Pupils: Pupils are equal, round, and reactive to light.  ?Cardiovascular:  ?   Rate and Rhythm: Normal rate and regular rhythm.  ?   Heart sounds: Normal heart sounds.  ?Pulmonary:  ?   Effort: Pulmonary effort is normal.  ?   Breath sounds: Normal breath sounds.  ?Abdominal:  ?   General: Bowel sounds are normal.  ?   Palpations: Abdomen is  soft.  ?Musculoskeletal:     ?   General: No tenderness or deformity. Normal range of motion.  ?   Cervical back: Normal range of motion.  ?Lymphadenopathy:  ?   Cervical: No cervical adenopathy.  ?Skin: ?   General: Skin is warm and dry.  ?   Findings: No erythema or rash.  ?Neurological:  ?   Mental Status: He is alert and oriented to person, place, and time.  ?Psychiatric:     ?  Behavior: Behavior normal.     ?   Thought Content: Thought content normal.     ?   Judgment: Judgment normal.  ? ? ? ?Lab Results  ?Component Value Date  ? WBC 2.9 (L) 01/31/2022  ? HGB 10.8 (L) 01/31/2022  ? HCT 32.8 (L) 01/31/2022  ? MCV 95.3 01/31/2022  ? PLT 125 (L) 01/31/2022  ? ?Lab Results  ?Component Value Date  ? FERRITIN 2,286 (H) 12/20/2021  ? IRON 81 12/20/2021  ? TIBC 307 12/20/2021  ? UIBC 226 12/20/2021  ? IRONPCTSAT 26 12/20/2021  ? ?Lab Results  ?Component Value Date  ? RETICCTPCT 3.0 12/20/2021  ? RBC 3.44 (L) 01/31/2022  ? RETICCTABS 51.79 05/06/2011  ? ?Lab Results  ?Component Value Date  ? KPAFRELGTCHN 23.0 (H) 12/20/2021  ? LAMBDASER 11.9 12/20/2021  ? KAPLAMBRATIO 1.93 (H) 12/20/2021  ? ?Lab Results  ?Component Value Date  ? IGGSERUM 650 12/20/2021  ? IGA 67 12/20/2021  ? IGMSERUM 34 12/20/2021  ? ?Lab Results  ?Component Value Date  ? TOTALPROTELP 6.1 07/26/2021  ? ALBUMINELP 3.6 07/26/2021  ? A1GS 0.3 07/26/2021  ? A2GS 0.6 07/26/2021  ? BETS 1.0 07/26/2021  ? GAMS 0.6 07/26/2021  ? MSPIKE Not Observed 07/26/2021  ? SPEI Comment 07/10/2021  ? ?  Chemistry   ?   ?Component Value Date/Time  ? NA 139 01/31/2022 0831  ? NA 145 07/22/2017 0756  ? NA 139 06/27/2016 1410  ? K 4.8 01/31/2022 0831  ? K 4.3 07/22/2017 0756  ? K 5.1 06/27/2016 1410  ? CL 104 01/31/2022 0831  ? CL 105 07/22/2017 0756  ? CO2 28 01/31/2022 0831  ? CO2 30 07/22/2017 0756  ? CO2 28 06/27/2016 1410  ? BUN 21 01/31/2022 0831  ? BUN 17 07/22/2017 0756  ? BUN 20.9 06/27/2016 1410  ? CREATININE 1.28 (H) 01/31/2022 0831  ? CREATININE 1.6 (H)  07/22/2017 0756  ? CREATININE 1.6 (H) 06/27/2016 1410  ?    ?Component Value Date/Time  ? CALCIUM 9.5 01/31/2022 0831  ? CALCIUM 9.6 07/22/2017 0756  ? CALCIUM 9.6 06/27/2016 1410  ? ALKPHOS 87 01/31/2022 0831

## 2022-01-31 NOTE — Patient Instructions (Signed)
[facility]  Discharge Instructions: ?Thank you for choosing [facility] to provide your oncology and hematology care.  ? ?If you have a lab appointment with the Cancer Center, please go directly to the Cancer Center and check in at the registration area. ? ?Wear comfortable clothing and clothing appropriate for easy access to any Portacath or PICC line.  ? ?We strive to give you quality time with your provider. You may need to reschedule your appointment if you arrive late (15 or more minutes).  Arriving late affects you and other patients whose appointments are after yours.  Also, if you miss three or more appointments without notifying the office, you may be dismissed from the clinic at the provider?s discretion.    ?  ?For prescription refill requests, have your pharmacy contact our office and allow 72 hours for refills to be completed.   ? ?Today you received the following chemotherapy and/or immunotherapy agents Rituxan, Bendeka.    ?  ?To help prevent nausea and vomiting after your treatment, we encourage you to take your nausea medication as directed. ? ?BELOW ARE SYMPTOMS THAT SHOULD BE REPORTED IMMEDIATELY: ?*FEVER GREATER THAN 100.4 F (38 ?C) OR HIGHER ?*CHILLS OR SWEATING ?*NAUSEA AND VOMITING THAT IS NOT CONTROLLED WITH YOUR NAUSEA MEDICATION ?*UNUSUAL SHORTNESS OF BREATH ?*UNUSUAL BRUISING OR BLEEDING ?*URINARY PROBLEMS (pain or burning when urinating, or frequent urination) ?*BOWEL PROBLEMS (unusual diarrhea, constipation, pain near the anus) ?TENDERNESS IN MOUTH AND THROAT WITH OR WITHOUT PRESENCE OF ULCERS (sore throat, sores in mouth, or a toothache) ?UNUSUAL RASH, SWELLING OR PAIN  ?UNUSUAL VAGINAL DISCHARGE OR ITCHING  ? ?Items with * indicate a potential emergency and should be followed up as soon as possible or go to the Emergency Department if any problems should occur. ? ?Please show the CHEMOTHERAPY ALERT CARD or IMMUNOTHERAPY ALERT CARD at check-in  to the Emergency Department and triage nurse. ?Should you have questions after your visit or need to cancel or reschedule your appointment, please contact [facility]  [phone] and follow the prompts.  Office hours are 8:00 a.m. to 4:30 p.m. Monday - Friday. Please note that voicemails left after 4:00 p.m. may not be returned until the following business day.  We are closed weekends and major holidays. You have access to a nurse at all times for urgent questions. Please call the main number to the clinic [phone] and follow the prompts. ? ?For any non-urgent questions, you may also contact your provider using MyChart. We now offer e-Visits for anyone 18 and older to request care online for non-urgent symptoms. For details visit [email or web address]. ?  ?Also download the MyChart app! Go to the app store, search "MyChart", open the app, select , and log in with your MyChart username and password. ? ?Due to Covid, a mask is required upon entering the hospital/clinic. If you do not have a mask, one will be given to you upon arrival. For doctor visits, patients may have 1 support person aged 18 or older with them. For treatment visits, patients cannot have anyone with them due to current Covid guidelines and our immunocompromised population.  ? ? ?

## 2022-02-01 ENCOUNTER — Encounter: Payer: Self-pay | Admitting: Hematology & Oncology

## 2022-02-01 ENCOUNTER — Inpatient Hospital Stay: Payer: No Typology Code available for payment source

## 2022-02-01 ENCOUNTER — Telehealth: Payer: Self-pay | Admitting: Hematology & Oncology

## 2022-02-01 VITALS — BP 123/78 | HR 75 | Temp 98.3°F | Resp 16

## 2022-02-01 DIAGNOSIS — C9110 Chronic lymphocytic leukemia of B-cell type not having achieved remission: Secondary | ICD-10-CM

## 2022-02-01 DIAGNOSIS — Z5111 Encounter for antineoplastic chemotherapy: Secondary | ICD-10-CM | POA: Diagnosis not present

## 2022-02-01 LAB — IGG, IGA, IGM
IgA: 56 mg/dL — ABNORMAL LOW (ref 61–437)
IgG (Immunoglobin G), Serum: 585 mg/dL — ABNORMAL LOW (ref 603–1613)
IgM (Immunoglobulin M), Srm: 27 mg/dL (ref 15–143)

## 2022-02-01 LAB — BETA 2 MICROGLOBULIN, SERUM: Beta-2 Microglobulin: 3 mg/L — ABNORMAL HIGH (ref 0.6–2.4)

## 2022-02-01 MED ORDER — SODIUM CHLORIDE 0.9 % IV SOLN: Freq: Once | INTRAVENOUS | Status: AC

## 2022-02-01 MED ORDER — SODIUM CHLORIDE 0.9 % IV SOLN
64.8000 mg/m2 | Freq: Once | INTRAVENOUS | Status: AC
  Administered 2022-02-01: 125 mg via INTRAVENOUS
  Filled 2022-02-01: qty 5

## 2022-02-01 MED ORDER — SODIUM CHLORIDE 0.9 % IV SOLN
10.0000 mg | Freq: Once | INTRAVENOUS | Status: AC
  Administered 2022-02-01: 10 mg via INTRAVENOUS
  Filled 2022-02-01: qty 10

## 2022-02-01 NOTE — Telephone Encounter (Signed)
Called to schedule per 3/23 los , patient did not answer and left voicemail for patient to call back  ?

## 2022-02-01 NOTE — Patient Instructions (Signed)
[facility]  Discharge Instructions: ?Thank you for choosing [facility] to provide your oncology and hematology care.  ? ?If you have a lab appointment with the Cancer Center, please go directly to the Cancer Center and check in at the registration area. ? ?Wear comfortable clothing and clothing appropriate for easy access to any Portacath or PICC line.  ? ?We strive to give you quality time with your provider. You may need to reschedule your appointment if you arrive late (15 or more minutes).  Arriving late affects you and other patients whose appointments are after yours.  Also, if you miss three or more appointments without notifying the office, you may be dismissed from the clinic at the provider?s discretion.    ?  ?For prescription refill requests, have your pharmacy contact our office and allow 72 hours for refills to be completed.   ? ?Today you received the following chemotherapy and/or immunotherapy agents Bendeka    ?  ?To help prevent nausea and vomiting after your treatment, we encourage you to take your nausea medication as directed. ? ?BELOW ARE SYMPTOMS THAT SHOULD BE REPORTED IMMEDIATELY: ?*FEVER GREATER THAN 100.4 F (38 ?C) OR HIGHER ?*CHILLS OR SWEATING ?*NAUSEA AND VOMITING THAT IS NOT CONTROLLED WITH YOUR NAUSEA MEDICATION ?*UNUSUAL SHORTNESS OF BREATH ?*UNUSUAL BRUISING OR BLEEDING ?*URINARY PROBLEMS (pain or burning when urinating, or frequent urination) ?*BOWEL PROBLEMS (unusual diarrhea, constipation, pain near the anus) ?TENDERNESS IN MOUTH AND THROAT WITH OR WITHOUT PRESENCE OF ULCERS (sore throat, sores in mouth, or a toothache) ?UNUSUAL RASH, SWELLING OR PAIN  ?UNUSUAL VAGINAL DISCHARGE OR ITCHING  ? ?Items with * indicate a potential emergency and should be followed up as soon as possible or go to the Emergency Department if any problems should occur. ? ?Please show the CHEMOTHERAPY ALERT CARD or IMMUNOTHERAPY ALERT CARD at check-in to the  Emergency Department and triage nurse. ?Should you have questions after your visit or need to cancel or reschedule your appointment, please contact [facility]  [phone] and follow the prompts.  Office hours are 8:00 a.m. to 4:30 p.m. Monday - Friday. Please note that voicemails left after 4:00 p.m. may not be returned until the following business day.  We are closed weekends and major holidays. You have access to a nurse at all times for urgent questions. Please call the main number to the clinic [phone] and follow the prompts. ? ?For any non-urgent questions, you may also contact your provider using MyChart. We now offer e-Visits for anyone 18 and older to request care online for non-urgent symptoms. For details visit [email or web address]. ?  ?Also download the MyChart app! Go to the app store, search "MyChart", open the app, select [facility], and log in with your MyChart username and password. ? ?Due to Covid, a mask is required upon entering the hospital/clinic. If you do not have a mask, one will be given to you upon arrival. For doctor visits, patients may have 1 support person aged 18 or older with them. For treatment visits, patients cannot have anyone with them due to current Covid guidelines and our immunocompromised population.  ?

## 2022-02-04 ENCOUNTER — Inpatient Hospital Stay: Payer: No Typology Code available for payment source

## 2022-02-04 ENCOUNTER — Telehealth (HOSPITAL_BASED_OUTPATIENT_CLINIC_OR_DEPARTMENT_OTHER): Payer: Self-pay

## 2022-02-04 ENCOUNTER — Other Ambulatory Visit: Payer: Self-pay

## 2022-02-04 VITALS — BP 139/73 | HR 68 | Temp 98.0°F | Resp 17

## 2022-02-04 DIAGNOSIS — Z5111 Encounter for antineoplastic chemotherapy: Secondary | ICD-10-CM | POA: Diagnosis not present

## 2022-02-04 DIAGNOSIS — D709 Neutropenia, unspecified: Secondary | ICD-10-CM

## 2022-02-04 DIAGNOSIS — D509 Iron deficiency anemia, unspecified: Secondary | ICD-10-CM

## 2022-02-04 DIAGNOSIS — D631 Anemia in chronic kidney disease: Secondary | ICD-10-CM

## 2022-02-04 LAB — KAPPA/LAMBDA LIGHT CHAINS
Kappa free light chain: 18.2 mg/L (ref 3.3–19.4)
Kappa, lambda light chain ratio: 2.17 — ABNORMAL HIGH (ref 0.26–1.65)
Lambda free light chains: 8.4 mg/L (ref 5.7–26.3)

## 2022-02-04 MED ORDER — PEGFILGRASTIM-BMEZ 6 MG/0.6ML ~~LOC~~ SOSY
6.0000 mg | PREFILLED_SYRINGE | Freq: Once | SUBCUTANEOUS | Status: AC
  Administered 2022-02-04: 6 mg via SUBCUTANEOUS
  Filled 2022-02-04: qty 0.6

## 2022-02-04 NOTE — Patient Instructions (Signed)

## 2022-02-05 ENCOUNTER — Ambulatory Visit (INDEPENDENT_AMBULATORY_CARE_PROVIDER_SITE_OTHER): Payer: Medicare Other

## 2022-02-05 DIAGNOSIS — Z5181 Encounter for therapeutic drug level monitoring: Secondary | ICD-10-CM

## 2022-02-05 DIAGNOSIS — I2699 Other pulmonary embolism without acute cor pulmonale: Secondary | ICD-10-CM | POA: Diagnosis not present

## 2022-02-05 DIAGNOSIS — Z952 Presence of prosthetic heart valve: Secondary | ICD-10-CM

## 2022-02-05 DIAGNOSIS — I4891 Unspecified atrial fibrillation: Secondary | ICD-10-CM | POA: Diagnosis not present

## 2022-02-05 LAB — POCT INR: INR: 2.1 (ref 2.0–3.0)

## 2022-02-05 NOTE — Patient Instructions (Signed)
Description   ?Take an extra 1 tablet today, then start taking Warfarin 2 tablets daily except for 1.5 tablets on Mondays and Fridays. Recheck INR in 2 weeks.  ?Please call coumadin clinic with questions at [phone].  ?  ?  ?

## 2022-02-09 ENCOUNTER — Ambulatory Visit (HOSPITAL_BASED_OUTPATIENT_CLINIC_OR_DEPARTMENT_OTHER)
Admission: RE | Admit: 2022-02-09 | Discharge: 2022-02-09 | Disposition: A | Discharge status: Home / Self Care | Payer: No Typology Code available for payment source | Source: Ambulatory Visit | Attending: Hematology & Oncology | Admitting: Hematology & Oncology

## 2022-02-09 ENCOUNTER — Encounter (HOSPITAL_BASED_OUTPATIENT_CLINIC_OR_DEPARTMENT_OTHER): Payer: Self-pay

## 2022-02-09 DIAGNOSIS — C9110 Chronic lymphocytic leukemia of B-cell type not having achieved remission: Secondary | ICD-10-CM | POA: Insufficient documentation

## 2022-02-09 MED ORDER — IOHEXOL 300 MG/ML  SOLN
100.0000 mL | Freq: Once | INTRAMUSCULAR | Status: AC | PRN
  Administered 2022-02-09: 100 mL via INTRAVENOUS

## 2022-02-11 ENCOUNTER — Encounter: Payer: Self-pay | Admitting: *Deleted

## 2022-02-19 ENCOUNTER — Ambulatory Visit (INDEPENDENT_AMBULATORY_CARE_PROVIDER_SITE_OTHER): Payer: Medicare Other

## 2022-02-19 DIAGNOSIS — I2699 Other pulmonary embolism without acute cor pulmonale: Secondary | ICD-10-CM

## 2022-02-19 DIAGNOSIS — Z5181 Encounter for therapeutic drug level monitoring: Secondary | ICD-10-CM

## 2022-02-19 DIAGNOSIS — I4891 Unspecified atrial fibrillation: Secondary | ICD-10-CM | POA: Diagnosis not present

## 2022-02-19 DIAGNOSIS — Z952 Presence of prosthetic heart valve: Secondary | ICD-10-CM

## 2022-02-19 LAB — POCT INR: INR: 2.3 (ref 2.0–3.0)

## 2022-02-19 NOTE — Patient Instructions (Signed)
Description   ?Start taking Warfarin 2 tablets daily except for 1.5 tablets on Mondays. Recheck INR in 2 weeks. Please call coumadin clinic with questions at [phone].  ?  ?  ?

## 2022-02-21 ENCOUNTER — Ambulatory Visit: Payer: Medicare Other | Admitting: Psychology

## 2022-02-22 ENCOUNTER — Ambulatory Visit (INDEPENDENT_AMBULATORY_CARE_PROVIDER_SITE_OTHER): Payer: Medicare Other | Admitting: Psychology

## 2022-02-22 DIAGNOSIS — F4323 Adjustment disorder with mixed anxiety and depressed mood: Secondary | ICD-10-CM

## 2022-02-22 NOTE — Progress Notes (Signed)
?Date: 02/22/2022  ?Treatment Plan: ?Diagnosis ?F99 (Unspecified mental disorder) [n/a]  ?309.28 (Adjustment disorder with mixed anxiety and depressed mood) [n/a]  ?Symptoms ?Depressed or irritable mood. (Status: maintained) -- No Description Entered  ?Diminished interest in or enjoyment of activities. (Status: maintained) -- No Description Entered  ?Experiences disturbing and persistent thoughts, images, and/or perceptions of the traumatic event. (Status: maintained) -- No Description Entered  ?Has been exposed to a traumatic event involving actual or perceived threat of death or serious injury. (Status: maintained) -- No Description Entered  ?Intentionally avoids thoughts, feelings, or discussions related to the traumatic event. (Status: maintained) -- No Description Entered  ?Lack of energy. (Status: maintained) -- No Description Entered  ?Sad affect, social withdrawal, anxiety, loss of interest in activities, and low energy. (Status: maintained) -- No Description Entered  ?Symptoms present more than one month. (Status: maintained) -- No Description Entered  ?Medication Status ?compliance  ?Safety ?none  ?If Suicidal or Homicidal State Action Taken: unspecified  ?Current Risk: low ?Medications ?Lorazepam (Dosage: .'5mg'$ )  ?Meclizine (Dosage: unknown)  ?Sertrline (Dosage: '100mg'$ )  ?Xarelto (Dosage: unknown)  ?Objectives ?Related Problem: Develop healthy thinking patterns and beliefs about self, others, and the world that lead to the alleviation and help prevent the relapse of depression. ?Description: Implement mindfulness techniques for relapse prevention. ?Target Date: 2022-07-08 ?Frequency: Daily ?Modality: individual ?Progress: 50% ? ?Related Problem: Develop healthy thinking patterns and beliefs about self, others, and the world that lead to the alleviation and help prevent the relapse of depression. ?Description: Learn and implement conflict resolution skills to resolve interpersonal problems. ?Target Date:  2022-07-08 ?Frequency: Daily ?Modality: individual ?Progress: 60% ? ?Related Problem: Develop healthy thinking patterns and beliefs about self, others, and the world that lead to the alleviation and help prevent the relapse of depression. ?Description: Learn and implement behavioral strategies to overcome depression. ?Target Date: 2022-07-08 ?Frequency: Daily ?Modality: individual ?Progress: 50%  ?Related Problem: Develop healthy thinking patterns and beliefs about self, others, and the world that lead to the alleviation and help prevent the relapse of depression. ?Description: Describe current and past experiences with depression including their impact on functioning and attempts to resolve it. ?Target Date: 2022-07-08 ?Frequency: Daily ?Modality: individual ?Progress: 60% ? ?Client Response ?full compliance  ?Service Location ?Location, [address] [place], [place] [address]  ?Service Code ?cpt [id number]  ?Self care activities  ?Emotion regulation skills  ?Distress tolerance skill  ?Validate/empathize  ?Identified an insight  ?Facilitate problem solving  ?Normalize/Reframe  ?Comments  ?Dx: O84.16  ?[name] agreed to having a video session due to the pandemic. He was at home and I was in my home office.  ?Meds: Sertraline ('100mg'$ ), Vit. D, Meclizine, blood pressure med, cholesterol med, Lorazepam (.5 mg as needed).  ?Goals: Seeking relief to depressive feelings of sadness and lack of motivation/interest in activities. Also, has some anxieties related to health and his own mortality. Goal Date: 06-11-22  ? ?[name] had a birthday and is now 2. He says he is ready to get started traveling again. Says he woke up this morning and did not feel good for the first time in a while. He wonders if it is related to needing chemo again. ?He continues to progress medically and will get labs at the end of the week. He is hoping for some positive numbers and there is always anxiety associated with getting checked.  ?  ?             ? ?[name], PhD  Time:  12:35-1:30 55 minutes ? ? ? ? ? ? ? ? ? ? ? ? ? ? ? ?

## 2022-02-25 ENCOUNTER — Encounter: Payer: Self-pay | Admitting: Hematology & Oncology

## 2022-02-25 ENCOUNTER — Inpatient Hospital Stay: Payer: No Typology Code available for payment source | Attending: Hematology & Oncology

## 2022-02-25 DIAGNOSIS — Z79899 Other long term (current) drug therapy: Secondary | ICD-10-CM | POA: Insufficient documentation

## 2022-02-25 DIAGNOSIS — Z5111 Encounter for antineoplastic chemotherapy: Secondary | ICD-10-CM | POA: Insufficient documentation

## 2022-02-25 DIAGNOSIS — C9110 Chronic lymphocytic leukemia of B-cell type not having achieved remission: Secondary | ICD-10-CM | POA: Diagnosis present

## 2022-02-25 DIAGNOSIS — Z5189 Encounter for other specified aftercare: Secondary | ICD-10-CM | POA: Diagnosis not present

## 2022-02-25 LAB — CBC WITH DIFFERENTIAL (CANCER CENTER ONLY)
Abs Immature Granulocytes: 0.05 10*3/uL (ref 0.00–0.07)
Basophils Absolute: 0 10*3/uL (ref 0.0–0.1)
Basophils Relative: 1 %
Eosinophils Absolute: 0 10*3/uL (ref 0.0–0.5)
Eosinophils Relative: 0 %
HCT: 35.3 % — ABNORMAL LOW (ref 39.0–52.0)
Hemoglobin: 11.7 g/dL — ABNORMAL LOW (ref 13.0–17.0)
Immature Granulocytes: 2 %
Lymphocytes Relative: 18 %
Lymphs Abs: 0.5 10*3/uL — ABNORMAL LOW (ref 0.7–4.0)
MCH: 31.5 pg (ref 26.0–34.0)
MCHC: 33.1 g/dL (ref 30.0–36.0)
MCV: 95.1 fL (ref 80.0–100.0)
Monocytes Absolute: 0.4 10*3/uL (ref 0.1–1.0)
Monocytes Relative: 16 %
Neutro Abs: 1.7 10*3/uL (ref 1.7–7.7)
Neutrophils Relative %: 63 %
Platelet Count: 100 10*3/uL — ABNORMAL LOW (ref 150–400)
RBC: 3.71 MIL/uL — ABNORMAL LOW (ref 4.22–5.81)
RDW: 15 % (ref 11.5–15.5)
WBC Count: 2.6 10*3/uL — ABNORMAL LOW (ref 4.0–10.5)
nRBC: 0 % (ref 0.0–0.2)

## 2022-02-25 LAB — CMP (CANCER CENTER ONLY)
ALT: 33 U/L (ref 0–44)
AST: 43 U/L — ABNORMAL HIGH (ref 15–41)
Albumin: 3.7 g/dL (ref 3.5–5.0)
Alkaline Phosphatase: 91 U/L (ref 38–126)
Anion gap: 9 (ref 5–15)
BUN: 28 mg/dL — ABNORMAL HIGH (ref 8–23)
CO2: 24 mmol/L (ref 22–32)
Calcium: 9 mg/dL (ref 8.9–10.3)
Chloride: 101 mmol/L (ref 98–111)
Creatinine: 1.79 mg/dL — ABNORMAL HIGH (ref 0.61–1.24)
GFR, Estimated: 39 mL/min — ABNORMAL LOW (ref >60)
Glucose, Bld: 109 mg/dL — ABNORMAL HIGH (ref 70–99)
Potassium: 5 mmol/L (ref 3.5–5.1)
Sodium: 134 mmol/L — ABNORMAL LOW (ref 135–145)
Total Bilirubin: 0.6 mg/dL (ref 0.3–1.2)
Total Protein: 6.6 g/dL (ref 6.5–8.1)

## 2022-02-25 LAB — LACTATE DEHYDROGENASE: LDH: 350 U/L — ABNORMAL HIGH (ref 98–192)

## 2022-02-25 LAB — SAVE SMEAR(SSMR), FOR PROVIDER SLIDE REVIEW

## 2022-02-26 LAB — BETA 2 MICROGLOBULIN, SERUM: Beta-2 Microglobulin: 6.5 mg/L — ABNORMAL HIGH (ref 0.6–2.4)

## 2022-02-26 LAB — KAPPA/LAMBDA LIGHT CHAINS
Kappa free light chain: 20.2 mg/L — ABNORMAL HIGH (ref 3.3–19.4)
Kappa, lambda light chain ratio: 1.63 (ref 0.26–1.65)
Lambda free light chains: 12.4 mg/L (ref 5.7–26.3)

## 2022-02-26 LAB — IGG, IGA, IGM
IgA: 61 mg/dL (ref 61–437)
IgG (Immunoglobin G), Serum: 605 mg/dL (ref 603–1613)
IgM (Immunoglobulin M), Srm: 34 mg/dL (ref 15–143)

## 2022-02-28 ENCOUNTER — Other Ambulatory Visit: Payer: Self-pay

## 2022-02-28 ENCOUNTER — Inpatient Hospital Stay: Payer: No Typology Code available for payment source

## 2022-02-28 ENCOUNTER — Inpatient Hospital Stay (HOSPITAL_BASED_OUTPATIENT_CLINIC_OR_DEPARTMENT_OTHER): Payer: No Typology Code available for payment source | Admitting: Hematology & Oncology

## 2022-02-28 ENCOUNTER — Encounter: Payer: Self-pay | Admitting: Hematology & Oncology

## 2022-02-28 VITALS — BP 90/50 | HR 61 | Temp 97.8°F | Resp 17

## 2022-02-28 VITALS — BP 105/49 | HR 78 | Temp 98.9°F | Resp 18 | Wt 174.0 lb

## 2022-02-28 DIAGNOSIS — C9110 Chronic lymphocytic leukemia of B-cell type not having achieved remission: Secondary | ICD-10-CM | POA: Diagnosis not present

## 2022-02-28 DIAGNOSIS — Z5111 Encounter for antineoplastic chemotherapy: Secondary | ICD-10-CM | POA: Diagnosis not present

## 2022-02-28 MED ORDER — DIPHENHYDRAMINE HCL 25 MG PO CAPS
50.0000 mg | ORAL_CAPSULE | Freq: Once | ORAL | Status: AC
  Administered 2022-02-28: 50 mg via ORAL
  Filled 2022-02-28: qty 2

## 2022-02-28 MED ORDER — PALONOSETRON HCL INJECTION 0.25 MG/5ML
0.2500 mg | Freq: Once | INTRAVENOUS | Status: AC
  Administered 2022-02-28: 0.25 mg via INTRAVENOUS
  Filled 2022-02-28: qty 5

## 2022-02-28 MED ORDER — SODIUM CHLORIDE 0.9 % IV SOLN
375.0000 mg/m2 | Freq: Once | INTRAVENOUS | Status: AC
  Administered 2022-02-28: 700 mg via INTRAVENOUS
  Filled 2022-02-28: qty 50

## 2022-02-28 MED ORDER — SODIUM CHLORIDE 0.9 % IV SOLN: Freq: Once | INTRAVENOUS | Status: AC

## 2022-02-28 MED ORDER — SODIUM CHLORIDE 0.9 % IV SOLN
64.8000 mg/m2 | Freq: Once | INTRAVENOUS | Status: AC
  Administered 2022-02-28: 125 mg via INTRAVENOUS
  Filled 2022-02-28: qty 5

## 2022-02-28 MED ORDER — SODIUM CHLORIDE 0.9 % IV SOLN
10.0000 mg | Freq: Once | INTRAVENOUS | Status: AC
  Administered 2022-02-28: 10 mg via INTRAVENOUS
  Filled 2022-02-28: qty 10

## 2022-02-28 MED ORDER — ACETAMINOPHEN 325 MG PO TABS
650.0000 mg | ORAL_TABLET | Freq: Once | ORAL | Status: AC
  Administered 2022-02-28: 650 mg via ORAL
  Filled 2022-02-28: qty 2

## 2022-02-28 NOTE — Progress Notes (Signed)
PIV infiltrated with flushing of rituxan infusion. Pt denies any pain. Large round golf ball size swelling noted to patient PIV site. Pressure dressing applied to site. Per Pharmacy no need for any further intervention. Pt will be in clinic tomorrow to assess infiltration site. Pt aware to call clinic with any questions or concerns. ?

## 2022-02-28 NOTE — Progress Notes (Signed)
Ok to treat with platelets of 100 & creatinine of 1.57 per Dr [name]. dph ?

## 2022-02-28 NOTE — Patient Instructions (Signed)
[facility]  Discharge Instructions: ?Thank you for choosing [facility] to provide your oncology and hematology care.  ? ?If you have a lab appointment with the [facility], please go directly to the [facility] and check in at the registration area. ? ?Wear comfortable clothing and clothing appropriate for easy access to any Portacath or PICC line.  ? ?We strive to give you quality time with your provider. You may need to reschedule your appointment if you arrive late (15 or more minutes).  Arriving late affects you and other patients whose appointments are after yours.  Also, if you miss three or more appointments without notifying the office, you may be dismissed from the clinic at the provider?s discretion.    ?  ?For prescription refill requests, have your pharmacy contact our office and allow 72 hours for refills to be completed.   ? ?Today you received the following chemotherapy and/or immunotherapy agents Bendeka and Rituxan ?  ?To help prevent nausea and vomiting after your treatment, we encourage you to take your nausea medication as directed. ? ?BELOW ARE SYMPTOMS THAT SHOULD BE REPORTED IMMEDIATELY: ?*FEVER GREATER THAN 100.4 F (38 ?C) OR HIGHER ?*CHILLS OR SWEATING ?*NAUSEA AND VOMITING THAT IS NOT CONTROLLED WITH YOUR NAUSEA MEDICATION ?*UNUSUAL SHORTNESS OF BREATH ?*UNUSUAL BRUISING OR BLEEDING ?*URINARY PROBLEMS (pain or burning when urinating, or frequent urination) ?*BOWEL PROBLEMS (unusual diarrhea, constipation, pain near the anus) ?TENDERNESS IN MOUTH AND THROAT WITH OR WITHOUT PRESENCE OF ULCERS (sore throat, sores in mouth, or a toothache) ?UNUSUAL RASH, SWELLING OR PAIN  ?UNUSUAL VAGINAL DISCHARGE OR ITCHING  ? ?Items with * indicate a potential emergency and should be followed up as soon as possible or go to the Emergency Department if any problems should occur. ? ?Please show the CHEMOTHERAPY ALERT CARD or IMMUNOTHERAPY ALERT CARD at check-in  to the Emergency Department and triage nurse. ?Should you have questions after your visit or need to cancel or reschedule your appointment, please contact [facility]  [phone] and follow the prompts.  Office hours are 8:00 a.m. to 4:30 p.m. Monday - Friday. Please note that voicemails left after 4:00 p.m. may not be returned until the following business day.  We are closed weekends and major holidays. You have access to a nurse at all times for urgent questions. Please call the main number to the clinic [phone] and follow the prompts. ? ?For any non-urgent questions, you may also contact your provider using MyChart. We now offer e-Visits for anyone 49 and older to request care online for non-urgent symptoms. For details visit [email or web address]. ?  ?Also download the MyChart app! Go to the app store, search "MyChart", open the app, select [facility], and log in with your MyChart username and password. ? ?Due to Covid, a mask is required upon entering the hospital/clinic. If you do not have a mask, one will be given to you upon arrival. For doctor visits, patients may have 1 support person aged 52 or older with them. For treatment visits, patients cannot have anyone with them due to current Covid guidelines and our immunocompromised population.  ?

## 2022-02-28 NOTE — Progress Notes (Signed)
?Hematology and Oncology Follow Up Visit ? ?[name] ?[id number] ?Jul 20, 1946 76 y.o. ?02/28/2022 ? ? ?Principle Diagnosis:  ?Right lower lobe pulmonary nodule- non-malignant ?Recurrent pulmonary embolism ?CLL -- progressive -- 11q-/13q- ?Chronic neutropenia secondary to chemotherapy/CLL ? ?Past Therapy: ?Calquence 100 mg po BID -- start on 03/13/2020 -- d/c on 07/05/2021 ?Venetoclax 100 mg po q day -- change 07/05/2021 -- d/c on 07/05/2021 ?  ?Current Therapy:   ?Rituxan/Bendamustine -- s/p cycle #2 -- start on 01/03/2022     ?Coumadin 5 mg po q day ?Ziextenzo 6 mg SQ as indicated for low WBC count/ANC ?  ?Interim History:  [name] is here today for follow-up.  He says he actually is feeling pretty good.  We had lab work done on him earlier in the week.  This is because he felt a little bit weak and lightheaded.  The lab work really did not look all that bad. ? ?He has had no problems with nausea or vomiting.  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  He says that he is quite busy doing this.  This may be why he is getting a little bit dehydrated. ? ?He has had no problems with fever.  He has had no issues with nausea or vomiting. ? ?We did do scans on him.  There were done in early April.  Scans show he had a very nice response to chemotherapy.  His splenomegaly resolved.  This was adenopathy resolved. ? ?He has had no problems with infections.  He has had no rashes.  Has had no leg swelling. ? ?He is on Coumadin because of the prosthetic valve. ? ?Currently, I would say that his performance status is ECOG 1.   ?      ? ?Medications:  ?Allergies as of 02/28/2022   ? ?   Reactions  ? Hydrocodone-acetaminophen Shortness Of Breath  ? Oxycodone Hcl Shortness Of Breath  ? Telmisartan-hctz Shortness Of Breath, Palpitations, Other (See Comments)  ? Dizziness also  ? Aspirin Rash, Other (See Comments)  ? Confusion, also- Cannot take high doses  ? Atorvastatin Other (See Comments)  ?  Myalgia  ? Buspirone Hcl Other (See Comments)  ? Headaches  ? Codeine Hives, Nausea And Vomiting, Other (See Comments)  ? Confusion also  ? Morphine Sulfate Itching  ? Paroxetine Other (See Comments)  ? Paresthesias: R arm, R leg, r side of face  ? Rabeprazole Other (See Comments)  ? headache  ? Ramipril Other (See Comments)  ? Unknown reaction  ? ?  ? ?  ?Medication List  ?  ? ?  ? Accurate as of February 28, 2022  2:11 PM. If you have any questions, ask your nurse or doctor.  ?  ?  ? ?  ? ?acyclovir 400 MG tablet ?Commonly known as: ZOVIRAX ?Take 1 tablet (400 mg total) by mouth daily. ?  ?[employer] Sickness Relief 25 MG Chew ?Generic drug: Meclizine HCl ?CHEW 1 TABLET EVERY DAY AS NEEDED FOR DIZZINESS ?  ?docusate sodium 100 MG capsule ?Commonly known as: COLACE ?Take 100 mg by mouth daily as needed (constipation). ?  ?ipratropium 0.03 % nasal spray ?Commonly known as: ATROVENT ?Place 2 sprays into both nostrils every 12 (twelve) hours. ?What changed:  ?when to take this ?reasons to take this ?  ?LORazepam 0.5 MG tablet ?Commonly known as: ATIVAN ?Take 1 tablet (0.5 mg total) by mouth at bedtime as needed for anxiety (can  take 1/4 or 1/2 tablet  if effective.). ?  ?omeprazole 40 MG capsule ?Commonly known as: PRILOSEC ?Take 40 mg by mouth every evening. ?  ?ondansetron 8 MG tablet ?Commonly known as: Zofran ?Take 1 tablet (8 mg total) by mouth 2 (two) times daily as needed for refractory nausea / vomiting. Start on day 2 after bendamustine chemo. ?  ?prochlorperazine 10 MG tablet ?Commonly known as: COMPAZINE ?Take 1 tablet (10 mg total) by mouth every 6 (six) hours as needed (Nausea or vomiting). ?  ?sertraline 100 MG tablet ?Commonly known as: ZOLOFT ?Take 100 mg by mouth every morning. ?  ?sodium chloride 0.65 % Soln nasal spray ?Commonly known as: OCEAN ?Place 1 spray into both nostrils as needed for congestion. ?  ?Vitamin D3 50 MCG (2000 UT) capsule ?Take 2,000 Units by mouth every morning. ?  ?warfarin 5 MG  tablet ?Commonly known as: COUMADIN ?Take as directed by the anticoagulation clinic. If you are unsure how to take this medication, talk to your nurse or doctor. ?Original instructions: TAKE AS DIRECTED BY COUMADIN CLINIC ?  ? ?  ? ? ?Allergies:  ?Allergies  ?Allergen Reactions  ? Hydrocodone-Acetaminophen Shortness Of Breath  ? Oxycodone Hcl Shortness Of Breath  ? Telmisartan-Hctz Shortness Of Breath, Palpitations and Other (See Comments)  ?  Dizziness also  ? Aspirin Rash and Other (See Comments)  ?  Confusion, also- Cannot take high doses  ? Atorvastatin Other (See Comments)  ?  Myalgia ?  ? Buspirone Hcl Other (See Comments)  ?  Headaches ?  ? Codeine Hives, Nausea And Vomiting and Other (See Comments)  ?  Confusion also  ? Morphine Sulfate Itching  ? Paroxetine Other (See Comments)  ?  Paresthesias: R arm, R leg, r side of face  ? Rabeprazole Other (See Comments)  ?  headache  ? Ramipril Other (See Comments)  ?  Unknown reaction  ? ? ?Past Medical History, Surgical history, Social history, and Family History were reviewed and updated. ? ?Review of Systems: ?. ?Review of Systems  ?Constitutional:  Positive for malaise/fatigue.  ?HENT: Negative.    ?Eyes: Negative.   ?Respiratory:  Positive for shortness of breath.   ?Cardiovascular:  Positive for palpitations.  ?Gastrointestinal:  Positive for nausea.  ?Genitourinary: Negative.   ?Musculoskeletal: Negative.   ?Skin: Negative.   ?Neurological: Negative.   ?Endo/Heme/Allergies: Negative.   ?Psychiatric/Behavioral: Negative.    ? ?Physical Exam: ? weight is 174 lb (78.9 kg). His oral temperature is 98.9 ?F (37.2 ?C). His blood pressure is 105/49 (abnormal) and his pulse is 78. His respiration is 18 and oxygen saturation is 100%.  ? ?Wt Readings from Last 3 Encounters:  ?02/28/22 174 lb (78.9 kg)  ?01/31/22 178 lb 1.9 oz (80.8 kg)  ?01/21/22 171 lb 6.4 oz (77.7 kg)  ? ? ?Physical Exam ?Vitals reviewed.  ?HENT:  ?   Head: Normocephalic and atraumatic.  ?Eyes:  ?    Pupils: Pupils are equal, round, and reactive to light.  ?Cardiovascular:  ?   Rate and Rhythm: Normal rate and regular rhythm.  ?   Heart sounds: Normal heart sounds.  ?Pulmonary:  ?   Effort: Pulmonary effort is normal.  ?   Breath sounds: Normal breath sounds.  ?Abdominal:  ?   General: Bowel sounds are normal.  ?   Palpations: Abdomen is soft.  ?Musculoskeletal:     ?   General: No tenderness or deformity. Normal range of motion.  ?   Cervical  back: Normal range of motion.  ?Lymphadenopathy:  ?   Cervical: No cervical adenopathy.  ?Skin: ?   General: Skin is warm and dry.  ?   Findings: No erythema or rash.  ?Neurological:  ?   Mental Status: He is alert and oriented to person, place, and time.  ?Psychiatric:     ?   Behavior: Behavior normal.     ?   Thought Content: Thought content normal.     ?   Judgment: Judgment normal.  ? ? ? ?Lab Results  ?Component Value Date  ? WBC 2.6 (L) 02/25/2022  ? HGB 11.7 (L) 02/25/2022  ? HCT 35.3 (L) 02/25/2022  ? MCV 95.1 02/25/2022  ? PLT 100 (L) 02/25/2022  ? ?Lab Results  ?Component Value Date  ? FERRITIN 2,286 (H) 12/20/2021  ? IRON 81 12/20/2021  ? TIBC 307 12/20/2021  ? UIBC 226 12/20/2021  ? IRONPCTSAT 26 12/20/2021  ? ?Lab Results  ?Component Value Date  ? RETICCTPCT 3.0 12/20/2021  ? RBC 3.71 (L) 02/25/2022  ? RETICCTABS 51.79 05/06/2011  ? ?Lab Results  ?Component Value Date  ? KPAFRELGTCHN 20.2 (H) 02/25/2022  ? LAMBDASER 12.4 02/25/2022  ? KAPLAMBRATIO 1.63 02/25/2022  ? ?Lab Results  ?Component Value Date  ? IGGSERUM 605 02/25/2022  ? IGA 61 02/25/2022  ? IGMSERUM 34 02/25/2022  ? ?Lab Results  ?Component Value Date  ? TOTALPROTELP 6.1 07/26/2021  ? ALBUMINELP 3.6 07/26/2021  ? A1GS 0.3 07/26/2021  ? A2GS 0.6 07/26/2021  ? BETS 1.0 07/26/2021  ? GAMS 0.6 07/26/2021  ? MSPIKE Not Observed 07/26/2021  ? SPEI Comment 07/10/2021  ? ?  Chemistry   ?   ?Component Value Date/Time  ? NA 134 (L) 02/25/2022 1420  ? NA 145 07/22/2017 0756  ? NA 139 06/27/2016 1410  ? K 5.0  02/25/2022 1420  ? K 4.3 07/22/2017 0756  ? K 5.1 06/27/2016 1410  ? CL 101 02/25/2022 1420  ? CL 105 07/22/2017 0756  ? CO2 24 02/25/2022 1420  ? CO2 30 07/22/2017 0756  ? CO2 28 06/27/2016 1410  ? BUN

## 2022-03-01 ENCOUNTER — Inpatient Hospital Stay: Payer: No Typology Code available for payment source

## 2022-03-01 VITALS — BP 86/53 | HR 77 | Temp 97.4°F | Resp 17

## 2022-03-01 DIAGNOSIS — C9110 Chronic lymphocytic leukemia of B-cell type not having achieved remission: Secondary | ICD-10-CM

## 2022-03-01 DIAGNOSIS — Z5111 Encounter for antineoplastic chemotherapy: Secondary | ICD-10-CM | POA: Diagnosis not present

## 2022-03-01 MED ORDER — SODIUM CHLORIDE 0.9 % IV SOLN
10.0000 mg | Freq: Once | INTRAVENOUS | Status: AC
  Administered 2022-03-01: 10 mg via INTRAVENOUS
  Filled 2022-03-01: qty 10

## 2022-03-01 MED ORDER — SODIUM CHLORIDE 0.9 % IV SOLN: Freq: Once | INTRAVENOUS | Status: AC

## 2022-03-01 MED ORDER — SODIUM CHLORIDE 0.9 % IV SOLN
64.8000 mg/m2 | Freq: Once | INTRAVENOUS | Status: AC
  Administered 2022-03-01: 125 mg via INTRAVENOUS
  Filled 2022-03-01: qty 5

## 2022-03-01 NOTE — Progress Notes (Signed)
Pt returned for treatment today with no complaints. Pt states that infiltration site from yesterday has no pain and pt denies any complaints. Site is clean dry and intact; no swelling or bruising noted to site.  ?

## 2022-03-01 NOTE — Patient Instructions (Signed)
[facility]  Discharge Instructions: ?Thank you for choosing [facility] to provide your oncology and hematology care.  ? ?If you have a lab appointment with the Cancer Center, please go directly to the Cancer Center and check in at the registration area. ? ?Wear comfortable clothing and clothing appropriate for easy access to any Portacath or PICC line.  ? ?We strive to give you quality time with your provider. You may need to reschedule your appointment if you arrive late (15 or more minutes).  Arriving late affects you and other patients whose appointments are after yours.  Also, if you miss three or more appointments without notifying the office, you may be dismissed from the clinic at the provider?s discretion.    ?  ?For prescription refill requests, have your pharmacy contact our office and allow 72 hours for refills to be completed.   ? ?Today you received the following chemotherapy and/or immunotherapy agents Bendeka    ?  ?To help prevent nausea and vomiting after your treatment, we encourage you to take your nausea medication as directed. ? ?BELOW ARE SYMPTOMS THAT SHOULD BE REPORTED IMMEDIATELY: ?*FEVER GREATER THAN 100.4 F (38 ?C) OR HIGHER ?*CHILLS OR SWEATING ?*NAUSEA AND VOMITING THAT IS NOT CONTROLLED WITH YOUR NAUSEA MEDICATION ?*UNUSUAL SHORTNESS OF BREATH ?*UNUSUAL BRUISING OR BLEEDING ?*URINARY PROBLEMS (pain or burning when urinating, or frequent urination) ?*BOWEL PROBLEMS (unusual diarrhea, constipation, pain near the anus) ?TENDERNESS IN MOUTH AND THROAT WITH OR WITHOUT PRESENCE OF ULCERS (sore throat, sores in mouth, or a toothache) ?UNUSUAL RASH, SWELLING OR PAIN  ?UNUSUAL VAGINAL DISCHARGE OR ITCHING  ? ?Items with * indicate a potential emergency and should be followed up as soon as possible or go to the Emergency Department if any problems should occur. ? ?Please show the CHEMOTHERAPY ALERT CARD or IMMUNOTHERAPY ALERT CARD at check-in to the  Emergency Department and triage nurse. ?Should you have questions after your visit or need to cancel or reschedule your appointment, please contact [facility]  [phone] and follow the prompts.  Office hours are 8:00 a.m. to 4:30 p.m. Monday - Friday. Please note that voicemails left after 4:00 p.m. may not be returned until the following business day.  We are closed weekends and major holidays. You have access to a nurse at all times for urgent questions. Please call the main number to the clinic [phone] and follow the prompts. ? ?For any non-urgent questions, you may also contact your provider using MyChart. We now offer e-Visits for anyone 18 and older to request care online for non-urgent symptoms. For details visit [email or web address]. ?  ?Also download the MyChart app! Go to the app store, search "MyChart", open the app, select [facility] [place], and log in with your MyChart username and password. ? ?Due to Covid, a mask is required upon entering the hospital/clinic. If you do not have a mask, one will be given to you upon arrival. For doctor visits, patients may have 1 support person aged 18 or older with them. For treatment visits, patients cannot have anyone with them due to current Covid guidelines and our immunocompromised population.  ?

## 2022-03-04 ENCOUNTER — Ambulatory Visit (INDEPENDENT_AMBULATORY_CARE_PROVIDER_SITE_OTHER): Payer: Medicare Other | Admitting: *Deleted

## 2022-03-04 ENCOUNTER — Encounter: Payer: Self-pay | Admitting: Hematology & Oncology

## 2022-03-04 ENCOUNTER — Inpatient Hospital Stay: Payer: No Typology Code available for payment source

## 2022-03-04 VITALS — BP 80/54 | HR 73 | Temp 97.8°F | Resp 17

## 2022-03-04 DIAGNOSIS — D631 Anemia in chronic kidney disease: Secondary | ICD-10-CM

## 2022-03-04 DIAGNOSIS — D709 Neutropenia, unspecified: Secondary | ICD-10-CM

## 2022-03-04 DIAGNOSIS — Z5111 Encounter for antineoplastic chemotherapy: Secondary | ICD-10-CM | POA: Diagnosis not present

## 2022-03-04 DIAGNOSIS — Z5181 Encounter for therapeutic drug level monitoring: Secondary | ICD-10-CM | POA: Diagnosis not present

## 2022-03-04 DIAGNOSIS — I2699 Other pulmonary embolism without acute cor pulmonale: Secondary | ICD-10-CM | POA: Diagnosis not present

## 2022-03-04 DIAGNOSIS — D509 Iron deficiency anemia, unspecified: Secondary | ICD-10-CM

## 2022-03-04 DIAGNOSIS — Z952 Presence of prosthetic heart valve: Secondary | ICD-10-CM

## 2022-03-04 DIAGNOSIS — I4891 Unspecified atrial fibrillation: Secondary | ICD-10-CM | POA: Diagnosis not present

## 2022-03-04 LAB — POCT INR: INR: 2.4 (ref 2.0–3.0)

## 2022-03-04 MED ORDER — EPOETIN ALFA-EPBX 40000 UNIT/ML IJ SOLN: 40000.0000 [IU] | Freq: Once | INTRAMUSCULAR | Status: DC

## 2022-03-04 MED ORDER — PEGFILGRASTIM-BMEZ 6 MG/0.6ML ~~LOC~~ SOSY
6.0000 mg | PREFILLED_SYRINGE | Freq: Once | SUBCUTANEOUS | Status: AC
  Administered 2022-03-04: 6 mg via SUBCUTANEOUS
  Filled 2022-03-04: qty 0.6

## 2022-03-04 NOTE — Patient Instructions (Signed)
Description   ?Today take 2 tablets then start taking Warfarin 2 tablets daily. Recheck INR in 2 weeks. Please call coumadin clinic with questions at [phone].  ?  ?  ?

## 2022-03-04 NOTE — Patient Instructions (Signed)

## 2022-03-11 ENCOUNTER — Encounter: Payer: Self-pay | Admitting: Family Medicine

## 2022-03-11 ENCOUNTER — Ambulatory Visit (INDEPENDENT_AMBULATORY_CARE_PROVIDER_SITE_OTHER): Payer: Medicare Other | Admitting: Family Medicine

## 2022-03-11 VITALS — BP 128/70 | HR 77 | Temp 97.8°F | Ht 70.0 in | Wt 172.8 lb

## 2022-03-11 DIAGNOSIS — N183 Chronic kidney disease, stage 3 unspecified: Secondary | ICD-10-CM

## 2022-03-11 DIAGNOSIS — K219 Gastro-esophageal reflux disease without esophagitis: Secondary | ICD-10-CM | POA: Diagnosis not present

## 2022-03-11 DIAGNOSIS — I4891 Unspecified atrial fibrillation: Secondary | ICD-10-CM

## 2022-03-11 DIAGNOSIS — F411 Generalized anxiety disorder: Secondary | ICD-10-CM

## 2022-03-11 DIAGNOSIS — F4310 Post-traumatic stress disorder, unspecified: Secondary | ICD-10-CM

## 2022-03-11 DIAGNOSIS — I10 Essential (primary) hypertension: Secondary | ICD-10-CM

## 2022-03-11 DIAGNOSIS — C9110 Chronic lymphocytic leukemia of B-cell type not having achieved remission: Secondary | ICD-10-CM

## 2022-03-11 DIAGNOSIS — E785 Hyperlipidemia, unspecified: Secondary | ICD-10-CM

## 2022-03-11 NOTE — Patient Instructions (Addendum)
Restart crestor ? ?Hope the ups and downs start to level out a little more for you ? ?Recommended follow up: Return in about 3 months (around 06/11/2022) for followup or sooner if needed.Schedule b4 you leave. ?

## 2022-03-15 ENCOUNTER — Encounter: Payer: Self-pay | Admitting: Hematology & Oncology

## 2022-03-19 ENCOUNTER — Ambulatory Visit (INDEPENDENT_AMBULATORY_CARE_PROVIDER_SITE_OTHER): Payer: Medicare Other

## 2022-03-19 DIAGNOSIS — I2699 Other pulmonary embolism without acute cor pulmonale: Secondary | ICD-10-CM

## 2022-03-19 DIAGNOSIS — Z5181 Encounter for therapeutic drug level monitoring: Secondary | ICD-10-CM

## 2022-03-19 DIAGNOSIS — Z952 Presence of prosthetic heart valve: Secondary | ICD-10-CM

## 2022-03-19 DIAGNOSIS — I4891 Unspecified atrial fibrillation: Secondary | ICD-10-CM | POA: Diagnosis not present

## 2022-03-19 LAB — POCT INR: INR: 3.1 — AB (ref 2.0–3.0)

## 2022-03-19 NOTE — Patient Instructions (Signed)
Description   ?Continue on same dosage of Warfarin 2 tablets daily. Recheck INR in 2 weeks. Please call coumadin clinic with questions at [phone].  ?  ?  ?

## 2022-03-21 ENCOUNTER — Encounter: Payer: Self-pay | Admitting: Hematology & Oncology

## 2022-03-21 ENCOUNTER — Ambulatory Visit (INDEPENDENT_AMBULATORY_CARE_PROVIDER_SITE_OTHER): Payer: Medicare Other | Admitting: Psychology

## 2022-03-21 DIAGNOSIS — F4323 Adjustment disorder with mixed anxiety and depressed mood: Secondary | ICD-10-CM | POA: Diagnosis not present

## 2022-03-21 NOTE — Progress Notes (Signed)
? ? ? ? ? ? ? ?Date: 03/21/2022  ?Treatment Plan: ?Diagnosis ?F99 (Unspecified mental disorder) [n/a]  ?309.28 (Adjustment disorder with mixed anxiety and depressed mood) [n/a]  ?Symptoms ?Depressed or irritable mood. (Status: maintained) -- No Description Entered  ?Diminished interest in or enjoyment of activities. (Status: maintained) -- No Description Entered  ?Experiences disturbing and persistent thoughts, images, and/or perceptions of the traumatic event. (Status: maintained) -- No Description Entered  ?Has been exposed to a traumatic event involving actual or perceived threat of death or serious injury. (Status: maintained) -- No Description Entered  ?Intentionally avoids thoughts, feelings, or discussions related to the traumatic event. (Status: maintained) -- No Description Entered  ?Lack of energy. (Status: maintained) -- No Description Entered  ?Sad affect, social withdrawal, anxiety, loss of interest in activities, and low energy. (Status: maintained) -- No Description Entered  ?Symptoms present more than one month. (Status: maintained) -- No Description Entered  ?Medication Status ?compliance  ?Safety ?none  ?If Suicidal or Homicidal State Action Taken: unspecified  ?Current Risk: low ?Medications ?Lorazepam (Dosage: .'5mg'$ )  ?Meclizine (Dosage: unknown)  ?Sertrline (Dosage: '100mg'$ )  ?Xarelto (Dosage: unknown)  ?Objectives ?Related Problem: Develop healthy thinking patterns and beliefs about self, others, and the world that lead to the alleviation and help prevent the relapse of depression. ?Description: Implement mindfulness techniques for relapse prevention. ?Target Date: 2022-07-08 ?Frequency: Daily ?Modality: individual ?Progress: 50% ? ?Related Problem: Develop healthy thinking patterns and beliefs about self, others, and the world that lead to the alleviation and help prevent the relapse of depression. ?Description: Learn and implement conflict resolution skills to resolve interpersonal  problems. ?Target Date: 2022-07-08 ?Frequency: Daily ?Modality: individual ?Progress: 60% ? ?Related Problem: Develop healthy thinking patterns and beliefs about self, others, and the world that lead to the alleviation and help prevent the relapse of depression. ?Description: Learn and implement behavioral strategies to overcome depression. ?Target Date: 2022-07-08 ?Frequency: Daily ?Modality: individual ?Progress: 50%  ?Related Problem: Develop healthy thinking patterns and beliefs about self, others, and the world that lead to the alleviation and help prevent the relapse of depression. ?Description: Describe current and past experiences with depression including their impact on functioning and attempts to resolve it. ?Target Date: 2022-07-08 ?Frequency: Daily ?Modality: individual ?Progress: 60% ? ?Client Response ?full compliance  ?Service Location ?Location, [address] [place], [place] [address]  ?Service Code ?cpt [id number]  ?Self care activities  ?Emotion regulation skills  ?Distress tolerance skill  ?Validate/empathize  ?Identified an insight  ?Facilitate problem solving  ?Normalize/Reframe  ?Comments  ?Dx: X51.70  ?[name] agreed to having a video session due to the pandemic. He was at home and I was in my home office.  ?Meds: Sertraline ('100mg'$ ), Vit. D, Meclizine, blood pressure med, cholesterol med, Lorazepam (.5 mg as needed).  ?Goals: Seeking relief to depressive feelings of sadness and lack of motivation/interest in activities. Also, has some anxieties related to health and his own mortality. Goal Date: 06-11-22  ? ?[name] says that he was wondering about his cancer treatment ans why he is not given a "number of treatments" like other cancer patients. He is thinking they are "going by the lab results". He felt like stopping treatment after his last round because he felt "awful". This past infusion was the worst. He knows other people that have refused treatment with the willingness to suffer the  consequences. He will decide whether or not to take the next treatment on the 18th. Will talk with me afterwards. Moofds are reasonably good.     ?  ?            ? ?  [name], PhD  Time: 1:15-2:00 45 minutes ? ? ? ? ? ? ? ? ? ? ? ? ? ? ? ?

## 2022-03-28 ENCOUNTER — Inpatient Hospital Stay (HOSPITAL_BASED_OUTPATIENT_CLINIC_OR_DEPARTMENT_OTHER): Payer: No Typology Code available for payment source | Admitting: Hematology & Oncology

## 2022-03-28 ENCOUNTER — Encounter: Payer: Self-pay | Admitting: Hematology & Oncology

## 2022-03-28 ENCOUNTER — Other Ambulatory Visit: Payer: Self-pay

## 2022-03-28 ENCOUNTER — Inpatient Hospital Stay: Payer: No Typology Code available for payment source

## 2022-03-28 ENCOUNTER — Encounter: Payer: Self-pay | Admitting: Family Medicine

## 2022-03-28 ENCOUNTER — Inpatient Hospital Stay: Payer: No Typology Code available for payment source | Attending: Hematology & Oncology

## 2022-03-28 VITALS — BP 134/68 | HR 66 | Temp 98.4°F | Resp 17 | Wt 177.0 lb

## 2022-03-28 DIAGNOSIS — C9110 Chronic lymphocytic leukemia of B-cell type not having achieved remission: Secondary | ICD-10-CM

## 2022-03-28 DIAGNOSIS — D701 Agranulocytosis secondary to cancer chemotherapy: Secondary | ICD-10-CM | POA: Insufficient documentation

## 2022-03-28 DIAGNOSIS — D509 Iron deficiency anemia, unspecified: Secondary | ICD-10-CM | POA: Diagnosis not present

## 2022-03-28 DIAGNOSIS — T451X5A Adverse effect of antineoplastic and immunosuppressive drugs, initial encounter: Secondary | ICD-10-CM | POA: Diagnosis not present

## 2022-03-28 DIAGNOSIS — D631 Anemia in chronic kidney disease: Secondary | ICD-10-CM | POA: Diagnosis not present

## 2022-03-28 DIAGNOSIS — D709 Neutropenia, unspecified: Secondary | ICD-10-CM

## 2022-03-28 DIAGNOSIS — R5081 Fever presenting with conditions classified elsewhere: Secondary | ICD-10-CM

## 2022-03-28 LAB — CMP (CANCER CENTER ONLY)
ALT: 15 U/L (ref 0–44)
AST: 18 U/L (ref 15–41)
Albumin: 4.3 g/dL (ref 3.5–5.0)
Alkaline Phosphatase: 83 U/L (ref 38–126)
Anion gap: 6 (ref 5–15)
BUN: 27 mg/dL — ABNORMAL HIGH (ref 8–23)
CO2: 29 mmol/L (ref 22–32)
Calcium: 9.6 mg/dL (ref 8.9–10.3)
Chloride: 102 mmol/L (ref 98–111)
Creatinine: 1.29 mg/dL — ABNORMAL HIGH (ref 0.61–1.24)
GFR, Estimated: 57 mL/min — ABNORMAL LOW (ref >60)
Glucose, Bld: 83 mg/dL (ref 70–99)
Potassium: 4.8 mmol/L (ref 3.5–5.1)
Sodium: 137 mmol/L (ref 135–145)
Total Bilirubin: 0.4 mg/dL (ref 0.3–1.2)
Total Protein: 6.4 g/dL — ABNORMAL LOW (ref 6.5–8.1)

## 2022-03-28 LAB — CBC WITH DIFFERENTIAL (CANCER CENTER ONLY)
Abs Immature Granulocytes: 0.04 10*3/uL (ref 0.00–0.07)
Basophils Absolute: 0 10*3/uL (ref 0.0–0.1)
Basophils Relative: 1 %
Eosinophils Absolute: 0.1 10*3/uL (ref 0.0–0.5)
Eosinophils Relative: 5 %
HCT: 33.7 % — ABNORMAL LOW (ref 39.0–52.0)
Hemoglobin: 11.1 g/dL — ABNORMAL LOW (ref 13.0–17.0)
Immature Granulocytes: 3 %
Lymphocytes Relative: 17 %
Lymphs Abs: 0.3 10*3/uL — ABNORMAL LOW (ref 0.7–4.0)
MCH: 31.7 pg (ref 26.0–34.0)
MCHC: 32.9 g/dL (ref 30.0–36.0)
MCV: 96.3 fL (ref 80.0–100.0)
Monocytes Absolute: 0.4 10*3/uL (ref 0.1–1.0)
Monocytes Relative: 23 %
Neutro Abs: 0.8 10*3/uL — ABNORMAL LOW (ref 1.7–7.7)
Neutrophils Relative %: 51 %
Platelet Count: 124 10*3/uL — ABNORMAL LOW (ref 150–400)
RBC: 3.5 MIL/uL — ABNORMAL LOW (ref 4.22–5.81)
RDW: 14.6 % (ref 11.5–15.5)
WBC Count: 1.6 10*3/uL — ABNORMAL LOW (ref 4.0–10.5)
nRBC: 0 % (ref 0.0–0.2)

## 2022-03-28 LAB — LACTATE DEHYDROGENASE: LDH: 288 U/L — ABNORMAL HIGH (ref 98–192)

## 2022-03-28 LAB — SAVE SMEAR(SSMR), FOR PROVIDER SLIDE REVIEW

## 2022-03-28 MED ORDER — PEGFILGRASTIM-BMEZ 6 MG/0.6ML ~~LOC~~ SOSY: 6.0000 mg | PREFILLED_SYRINGE | Freq: Once | SUBCUTANEOUS | Status: DC

## 2022-03-28 MED ORDER — PEGFILGRASTIM-BMEZ 6 MG/0.6ML ~~LOC~~ SOSY
6.0000 mg | PREFILLED_SYRINGE | Freq: Once | SUBCUTANEOUS | Status: AC
  Administered 2022-03-28: 6 mg via SUBCUTANEOUS
  Filled 2022-03-28: qty 0.6

## 2022-03-28 NOTE — Patient Instructions (Signed)
[facility]  Discharge Instructions: Thank you for choosing [facility] to provide your oncology and hematology care.   If you have a lab appointment with the [facility], please go directly to the [facility] and check in at the registration area.  Wear comfortable clothing and clothing appropriate for easy access to any Portacath or PICC line.   We strive to give you quality time with your provider. You may need to reschedule your appointment if you arrive late (15 or more minutes).  Arriving late affects you and other patients whose appointments are after yours.  Also, if you miss three or more appointments without notifying the office, you may be dismissed from the clinic at the provider's discretion.      For prescription refill requests, have your pharmacy contact our office and allow 72 hours for refills to be completed.    Today you received the following chemotherapy and/or immunotherapy agents Ziextenzo.   To help prevent nausea and vomiting after your treatment, we encourage you to take your nausea medication as directed.  BELOW ARE SYMPTOMS THAT SHOULD BE REPORTED IMMEDIATELY: *FEVER GREATER THAN 100.4 F (38 C) OR HIGHER *CHILLS OR SWEATING *NAUSEA AND VOMITING THAT IS NOT CONTROLLED WITH YOUR NAUSEA MEDICATION *UNUSUAL SHORTNESS OF BREATH *UNUSUAL BRUISING OR BLEEDING *URINARY PROBLEMS (pain or burning when urinating, or frequent urination) *BOWEL PROBLEMS (unusual diarrhea, constipation, pain near the anus) TENDERNESS IN MOUTH AND THROAT WITH OR WITHOUT PRESENCE OF ULCERS (sore throat, sores in mouth, or a toothache) UNUSUAL RASH, SWELLING OR PAIN  UNUSUAL VAGINAL DISCHARGE OR ITCHING   Items with * indicate a potential emergency and should be followed up as soon as possible or go to the Emergency Department if any problems should occur.  Please show the CHEMOTHERAPY ALERT CARD or IMMUNOTHERAPY ALERT CARD at check-in to the  Emergency Department and triage nurse. Should you have questions after your visit or need to cancel or reschedule your appointment, please contact [place]  [phone] and follow the prompts.  Office hours are 8:00 a.m. to 4:30 p.m. Monday - Friday. Please note that voicemails left after 4:00 p.m. may not be returned until the following business day.  We are closed weekends and major holidays. You have access to a nurse at all times for urgent questions. Please call the main number to the clinic [phone] and follow the prompts.  For any non-urgent questions, you may also contact your provider using MyChart. We now offer e-Visits for anyone 24 and older to request care online for non-urgent symptoms. For details visit [email or web address].   Also download the MyChart app! Go to the app store, search "MyChart", open the app, select [facility], and log in with your MyChart username and password.  Due to Covid, a mask is required upon entering the hospital/clinic. If you do not have a mask, one will be given to you upon arrival. For doctor visits, patients may have 1 support person aged 76 or older with them. For treatment visits, patients cannot have anyone with them due to current Covid guidelines and our immunocompromised population.

## 2022-03-28 NOTE — Progress Notes (Signed)
Hematology and Oncology Follow Up Visit  [name] [id number] 12/24/1945 76 y.o. 03/28/2022   Principle Diagnosis:  Right lower lobe pulmonary nodule- non-malignant Recurrent pulmonary embolism CLL -- progressive -- 11q-/13q- Chronic neutropenia secondary to chemotherapy/CLL  Past Therapy: Calquence 100 mg po BID -- start on 03/13/2020 -- d/c on 07/05/2021 Venetoclax 100 mg po q day -- change 07/05/2021 -- d/c on 07/05/2021   Current Therapy:   Rituxan/Bendamustine -- s/p cycle #3 -- start on 01/03/2022     Coumadin 5 mg po q day Ziextenzo 6 mg SQ as indicated for low WBC count/ANC   Interim History:  [name] is here today for follow-up.  Unfortunately, we are running the problems that we have had before.  His white cell count is just too low.  I have to believe this is from the Rituxan.  I just really hate the fact that his white cells just cannot tolerate Rituxan.  We will get have to stop the Rituxan.  He is going need to have Neulasta.  He feels well.  He looks great.  He does look much better than his blood counts.  He has had no problems with cough.  He has had some dizziness.  I noted that he had dizziness back in 2018.  He had a MRI of the brain at that time which was unremarkable.  He has not fallen.  He does not have any vertigo.  He is eating okay.  He is having no problems with bowels or bladder.  There is no rashes.  He has had no leg swelling.  He is on Coumadin for the mechanical heart valve.  He has had no fever.  He has had no mouth sores.  Overall, I would say his performance status is probably ECOG 1.         Medications:  Allergies as of 03/28/2022       Reactions   Hydrocodone-acetaminophen Shortness Of Breath   Oxycodone Hcl Shortness Of Breath   Telmisartan-hctz Shortness Of Breath, Palpitations, Other (See Comments)   Dizziness also   Aspirin Rash, Other (See Comments)   Confusion, also- Cannot take high doses   Atorvastatin Other (See  Comments)   Myalgia   Buspirone Hcl Other (See Comments)   Headaches   Codeine Hives, Nausea And Vomiting, Other (See Comments)   Confusion also   Morphine Sulfate Itching   Paroxetine Other (See Comments)   Paresthesias: R arm, R leg, r side of face   Rabeprazole Other (See Comments)   headache   Ramipril Other (See Comments)   Unknown reaction        Medication List        Accurate as of Mar 28, 2022  9:47 AM. If you have any questions, ask your nurse or doctor.          acyclovir 400 MG tablet Commonly known as: ZOVIRAX Take 1 tablet (400 mg total) by mouth daily.   [facility] [employer] Sickness Relief 25 MG Chew Generic drug: Meclizine HCl CHEW 1 TABLET EVERY DAY AS NEEDED FOR DIZZINESS   docusate sodium 100 MG capsule Commonly known as: COLACE Take 100 mg by mouth daily as needed (constipation).   ipratropium 0.03 % nasal spray Commonly known as: ATROVENT Place 2 sprays into both nostrils every 12 (twelve) hours. What changed:  when to take this reasons to take this   LORazepam 0.5 MG tablet Commonly known as: ATIVAN Take 1 tablet (0.5 mg total) by mouth at  bedtime as needed for anxiety (can take 1/4 or 1/2 tablet  if effective.).   omeprazole 40 MG capsule Commonly known as: PRILOSEC Take 40 mg by mouth every evening.   ondansetron 8 MG tablet Commonly known as: Zofran Take 1 tablet (8 mg total) by mouth 2 (two) times daily as needed for refractory nausea / vomiting. Start on day 2 after bendamustine chemo.   prochlorperazine 10 MG tablet Commonly known as: COMPAZINE Take 1 tablet (10 mg total) by mouth every 6 (six) hours as needed (Nausea or vomiting).   rosuvastatin 5 MG tablet Commonly known as: CRESTOR Take 5 mg by mouth daily.   sertraline 100 MG tablet Commonly known as: ZOLOFT Take 100 mg by mouth every morning.   sodium chloride 0.65 % Soln nasal spray Commonly known as: OCEAN Place 1 spray into both nostrils as needed for congestion.    Vitamin D3 50 MCG (2000 UT) capsule Take 2,000 Units by mouth every morning.   warfarin 5 MG tablet Commonly known as: COUMADIN Take as directed by the anticoagulation clinic. If you are unsure how to take this medication, talk to your nurse or doctor. Original instructions: TAKE AS DIRECTED BY COUMADIN CLINIC        Allergies:  Allergies  Allergen Reactions   Hydrocodone-Acetaminophen Shortness Of Breath   Oxycodone Hcl Shortness Of Breath   Telmisartan-Hctz Shortness Of Breath, Palpitations and Other (See Comments)    Dizziness also   Aspirin Rash and Other (See Comments)    Confusion, also- Cannot take high doses   Atorvastatin Other (See Comments)    Myalgia    Buspirone Hcl Other (See Comments)    Headaches    Codeine Hives, Nausea And Vomiting and Other (See Comments)    Confusion also   Morphine Sulfate Itching   Paroxetine Other (See Comments)    Paresthesias: R arm, R leg, r side of face   Rabeprazole Other (See Comments)    headache   Ramipril Other (See Comments)    Unknown reaction    Past Medical History, Surgical history, Social history, and Family History were reviewed and updated.  Review of Systems: . Review of Systems  Constitutional:  Positive for malaise/fatigue.  HENT: Negative.    Eyes: Negative.   Respiratory:  Positive for shortness of breath.   Cardiovascular:  Positive for palpitations.  Gastrointestinal:  Positive for nausea.  Genitourinary: Negative.   Musculoskeletal: Negative.   Skin: Negative.   Neurological: Negative.   Endo/Heme/Allergies: Negative.   Psychiatric/Behavioral: Negative.     Physical Exam:  weight is 177 lb (80.3 kg). His oral temperature is 98.4 F (36.9 C). His blood pressure is 134/68 and his pulse is 66. His respiration is 17 and oxygen saturation is 100%.   Wt Readings from Last 3 Encounters:  03/28/22 177 lb (80.3 kg)  03/11/22 172 lb 12.8 oz (78.4 kg)  02/28/22 174 lb (78.9 kg)    Physical  Exam Vitals reviewed.  HENT:     Head: Normocephalic and atraumatic.  Eyes:     Pupils: Pupils are equal, round, and reactive to light.  Cardiovascular:     Rate and Rhythm: Normal rate and regular rhythm.     Heart sounds: Normal heart sounds.  Pulmonary:     Effort: Pulmonary effort is normal.     Breath sounds: Normal breath sounds.  Abdominal:     General: Bowel sounds are normal.     Palpations: Abdomen is soft.  Musculoskeletal:  General: No tenderness or deformity. Normal range of motion.     Cervical back: Normal range of motion.  Lymphadenopathy:     Cervical: No cervical adenopathy.  Skin:    General: Skin is warm and dry.     Findings: No erythema or rash.  Neurological:     Mental Status: He is alert and oriented to person, place, and time.  Psychiatric:        Behavior: Behavior normal.        Thought Content: Thought content normal.        Judgment: Judgment normal.     Lab Results  Component Value Date   WBC 1.6 (L) 03/28/2022   HGB 11.1 (L) 03/28/2022   HCT 33.7 (L) 03/28/2022   MCV 96.3 03/28/2022   PLT 124 (L) 03/28/2022   Lab Results  Component Value Date   FERRITIN 2,286 (H) 12/20/2021   IRON 81 12/20/2021   TIBC 307 12/20/2021   UIBC 226 12/20/2021   IRONPCTSAT 26 12/20/2021   Lab Results  Component Value Date   RETICCTPCT 3.0 12/20/2021   RBC 3.50 (L) 03/28/2022   RETICCTABS 51.79 05/06/2011   Lab Results  Component Value Date   KPAFRELGTCHN 20.2 (H) 02/25/2022   LAMBDASER 12.4 02/25/2022   KAPLAMBRATIO 1.63 02/25/2022   Lab Results  Component Value Date   IGGSERUM 605 02/25/2022   IGA 61 02/25/2022   IGMSERUM 34 02/25/2022   Lab Results  Component Value Date   TOTALPROTELP 6.1 07/26/2021   ALBUMINELP 3.6 07/26/2021   A1GS 0.3 07/26/2021   A2GS 0.6 07/26/2021   BETS 1.0 07/26/2021   GAMS 0.6 07/26/2021   MSPIKE Not Observed 07/26/2021   SPEI Comment 07/10/2021     Chemistry      Component Value Date/Time   NA  137 03/28/2022 0855   NA 145 07/22/2017 0756   NA 139 06/27/2016 1410   K 4.8 03/28/2022 0855   K 4.3 07/22/2017 0756   K 5.1 06/27/2016 1410   CL 102 03/28/2022 0855   CL 105 07/22/2017 0756   CO2 29 03/28/2022 0855   CO2 30 07/22/2017 0756   CO2 28 06/27/2016 1410   BUN 27 (H) 03/28/2022 0855   BUN 17 07/22/2017 0756   BUN 20.9 06/27/2016 1410   CREATININE 1.29 (H) 03/28/2022 0855   CREATININE 1.6 (H) 07/22/2017 0756   CREATININE 1.6 (H) 06/27/2016 1410      Component Value Date/Time   CALCIUM 9.6 03/28/2022 0855   CALCIUM 9.6 07/22/2017 0756   CALCIUM 9.6 06/27/2016 1410   ALKPHOS 83 03/28/2022 0855   ALKPHOS 59 07/22/2017 0756   ALKPHOS 62 06/27/2016 1410   AST 18 03/28/2022 0855   AST 18 06/27/2016 1410   ALT 15 03/28/2022 0855   ALT 24 07/22/2017 0756   ALT 24 06/27/2016 1410   BILITOT 0.4 03/28/2022 0855   BILITOT 0.46 06/27/2016 1410       Impression and Plan: [name] is a pleasant 76 yo African American gentleman with recurrent CLL and chronic neutropenia secondary to previous chemotherapy.   He has had 3 cycles of chemotherapy.  He has done well.  His last CT scan done in April looked pretty good.  Again I just hate the fact that his white cells are not that tolerant of the Rituxan.  We are going to have to stop Rituxan.  I think he would do okay just with Bendamustine.  He we will get Neulasta today.  I  will then have him come back in 2 weeks.  Hopefully, his white cell count will be better at that point.  I still think we have to treat him for this recurrent CLL.  Again we will have him come back in 2 weeks for treatment.   Volanda Napoleon, MD 5/18/20239:47 AM

## 2022-03-29 ENCOUNTER — Inpatient Hospital Stay: Payer: No Typology Code available for payment source

## 2022-03-29 LAB — IGG, IGA, IGM
IgA: 55 mg/dL — ABNORMAL LOW (ref 61–437)
IgG (Immunoglobin G), Serum: 570 mg/dL — ABNORMAL LOW (ref 603–1613)
IgM (Immunoglobulin M), Srm: 28 mg/dL (ref 15–143)

## 2022-03-29 LAB — KAPPA/LAMBDA LIGHT CHAINS
Kappa free light chain: 20 mg/L — ABNORMAL HIGH (ref 3.3–19.4)
Kappa, lambda light chain ratio: 2.11 — ABNORMAL HIGH (ref 0.26–1.65)
Lambda free light chains: 9.5 mg/L (ref 5.7–26.3)

## 2022-03-29 LAB — BETA 2 MICROGLOBULIN, SERUM: Beta-2 Microglobulin: 3.2 mg/L — ABNORMAL HIGH (ref 0.6–2.4)

## 2022-04-01 ENCOUNTER — Encounter: Payer: Self-pay | Admitting: Hematology & Oncology

## 2022-04-01 LAB — PROTEIN ELECTROPHORESIS, SERUM, WITH REFLEX
A/G Ratio: 2.2 — ABNORMAL HIGH (ref 0.7–1.7)
Albumin ELP: 4.1 g/dL (ref 2.9–4.4)
Alpha-1-Globulin: 0.2 g/dL (ref 0.0–0.4)
Alpha-2-Globulin: 0.5 g/dL (ref 0.4–1.0)
Beta Globulin: 0.9 g/dL (ref 0.7–1.3)
Gamma Globulin: 0.4 g/dL (ref 0.4–1.8)
Globulin, Total: 1.9 g/dL — ABNORMAL LOW (ref 2.2–3.9)
Total Protein ELP: 6 g/dL (ref 6.0–8.5)

## 2022-04-02 ENCOUNTER — Ambulatory Visit (INDEPENDENT_AMBULATORY_CARE_PROVIDER_SITE_OTHER): Payer: No Typology Code available for payment source | Admitting: *Deleted

## 2022-04-02 DIAGNOSIS — I4891 Unspecified atrial fibrillation: Secondary | ICD-10-CM | POA: Diagnosis not present

## 2022-04-02 DIAGNOSIS — Z5181 Encounter for therapeutic drug level monitoring: Secondary | ICD-10-CM

## 2022-04-02 LAB — POCT INR: INR: 1.9 — AB (ref 2.0–3.0)

## 2022-04-02 NOTE — Patient Instructions (Signed)
Description    Take 3 tablets of warfarin today and then continue to take warfarin 2 tablets daily. Recheck INR in 2 weeks. [facility] [phone]

## 2022-04-08 ENCOUNTER — Encounter: Payer: Self-pay | Admitting: Hematology & Oncology

## 2022-04-10 ENCOUNTER — Encounter: Payer: Self-pay | Admitting: Family

## 2022-04-10 ENCOUNTER — Inpatient Hospital Stay: Payer: No Typology Code available for payment source

## 2022-04-10 ENCOUNTER — Inpatient Hospital Stay (HOSPITAL_BASED_OUTPATIENT_CLINIC_OR_DEPARTMENT_OTHER): Payer: No Typology Code available for payment source | Admitting: Family

## 2022-04-10 VITALS — BP 127/75 | HR 68 | Temp 98.5°F | Resp 17 | Wt 181.0 lb

## 2022-04-10 DIAGNOSIS — D631 Anemia in chronic kidney disease: Secondary | ICD-10-CM | POA: Diagnosis not present

## 2022-04-10 DIAGNOSIS — D509 Iron deficiency anemia, unspecified: Secondary | ICD-10-CM | POA: Diagnosis not present

## 2022-04-10 DIAGNOSIS — C9110 Chronic lymphocytic leukemia of B-cell type not having achieved remission: Secondary | ICD-10-CM | POA: Diagnosis not present

## 2022-04-10 DIAGNOSIS — R5081 Fever presenting with conditions classified elsewhere: Secondary | ICD-10-CM

## 2022-04-10 DIAGNOSIS — D709 Neutropenia, unspecified: Secondary | ICD-10-CM

## 2022-04-10 DIAGNOSIS — D701 Agranulocytosis secondary to cancer chemotherapy: Secondary | ICD-10-CM | POA: Diagnosis not present

## 2022-04-10 LAB — CBC WITH DIFFERENTIAL (CANCER CENTER ONLY)
Abs Immature Granulocytes: 0.04 10*3/uL (ref 0.00–0.07)
Basophils Absolute: 0 10*3/uL (ref 0.0–0.1)
Basophils Relative: 2 %
Eosinophils Absolute: 0.1 10*3/uL (ref 0.0–0.5)
Eosinophils Relative: 3 %
HCT: 36 % — ABNORMAL LOW (ref 39.0–52.0)
Hemoglobin: 11.6 g/dL — ABNORMAL LOW (ref 13.0–17.0)
Immature Granulocytes: 2 %
Lymphocytes Relative: 17 %
Lymphs Abs: 0.4 10*3/uL — ABNORMAL LOW (ref 0.7–4.0)
MCH: 32 pg (ref 26.0–34.0)
MCHC: 32.2 g/dL (ref 30.0–36.0)
MCV: 99.4 fL (ref 80.0–100.0)
Monocytes Absolute: 0.3 10*3/uL (ref 0.1–1.0)
Monocytes Relative: 14 %
Neutro Abs: 1.4 10*3/uL — ABNORMAL LOW (ref 1.7–7.7)
Neutrophils Relative %: 62 %
Platelet Count: 146 10*3/uL — ABNORMAL LOW (ref 150–400)
RBC: 3.62 MIL/uL — ABNORMAL LOW (ref 4.22–5.81)
RDW: 15.2 % (ref 11.5–15.5)
WBC Count: 2.2 10*3/uL — ABNORMAL LOW (ref 4.0–10.5)
nRBC: 0 % (ref 0.0–0.2)

## 2022-04-10 LAB — CMP (CANCER CENTER ONLY)
ALT: 14 U/L (ref 0–44)
AST: 23 U/L (ref 15–41)
Albumin: 4.4 g/dL (ref 3.5–5.0)
Alkaline Phosphatase: 81 U/L (ref 38–126)
Anion gap: 6 (ref 5–15)
BUN: 24 mg/dL — ABNORMAL HIGH (ref 8–23)
CO2: 29 mmol/L (ref 22–32)
Calcium: 9.5 mg/dL (ref 8.9–10.3)
Chloride: 105 mmol/L (ref 98–111)
Creatinine: 1.27 mg/dL — ABNORMAL HIGH (ref 0.61–1.24)
GFR, Estimated: 59 mL/min — ABNORMAL LOW (ref >60)
Glucose, Bld: 93 mg/dL (ref 70–99)
Potassium: 4.9 mmol/L (ref 3.5–5.1)
Sodium: 140 mmol/L (ref 135–145)
Total Bilirubin: 0.5 mg/dL (ref 0.3–1.2)
Total Protein: 6.6 g/dL (ref 6.5–8.1)

## 2022-04-10 LAB — LACTATE DEHYDROGENASE: LDH: 485 U/L — ABNORMAL HIGH (ref 98–192)

## 2022-04-10 MED ORDER — PALONOSETRON HCL INJECTION 0.25 MG/5ML
0.2500 mg | Freq: Once | INTRAVENOUS | Status: AC
  Administered 2022-04-10: 0.25 mg via INTRAVENOUS
  Filled 2022-04-10: qty 5

## 2022-04-10 MED ORDER — SODIUM CHLORIDE 0.9 % IV SOLN
64.8000 mg/m2 | Freq: Once | INTRAVENOUS | Status: AC
  Administered 2022-04-10: 125 mg via INTRAVENOUS
  Filled 2022-04-10: qty 5

## 2022-04-10 MED ORDER — ACETAMINOPHEN 325 MG PO TABS: 650.0000 mg | ORAL_TABLET | Freq: Once | ORAL | Status: DC

## 2022-04-10 MED ORDER — SODIUM CHLORIDE 0.9 % IV SOLN: Freq: Once | INTRAVENOUS | Status: AC

## 2022-04-10 MED ORDER — DIPHENHYDRAMINE HCL 25 MG PO CAPS: 50.0000 mg | ORAL_CAPSULE | Freq: Once | ORAL | Status: DC

## 2022-04-10 MED ORDER — SODIUM CHLORIDE 0.9 % IV SOLN
10.0000 mg | Freq: Once | INTRAVENOUS | Status: AC
  Administered 2022-04-10: 10 mg via INTRAVENOUS
  Filled 2022-04-10: qty 10

## 2022-04-10 NOTE — Patient Instructions (Signed)
Bendamustine Injection ?What is this medication? ?BENDAMUSTINE (BEN da MUS teen) is a chemotherapy drug. It is used to treat chronic lymphocytic leukemia and non-Hodgkin lymphoma. ?This medicine may be used for other purposes; ask your health care provider or pharmacist if you have questions. ?COMMON BRAND NAME(S): BELRAPZO, BENDEKA, Treanda, VIVIMUSTA ?What should I tell my care team before I take this medication? ?They need to know if you have any of these conditions: ?infection (especially a virus infection such as chickenpox, cold sores, or herpes) ?kidney disease ?liver disease ?an unusual or allergic reaction to bendamustine, mannitol, other medicines, foods, dyes, or preservatives ?pregnant or trying to get pregnant ?breast-feeding ?How should I use this medication? ?This medicine is for infusion into a vein. It is given by a health care professional in a hospital or clinic setting. ?Talk to your pediatrician regarding the use of this medicine in children. Special care may be needed. ?Overdosage: If you think you have taken too much of this medicine contact a poison control center or emergency room at once. ?NOTE: This medicine is only for you. Do not share this medicine with others. ?What if I miss a dose? ?It is important not to miss your dose. Call your doctor or health care professional if you are unable to keep an appointment. ?What may interact with this medication? ?Do not take this medicine with any of the following medications: ?clozapine ?This medicine may also interact with the following medications: ?atazanavir ?cimetidine ?ciprofloxacin ?enoxacin ?fluvoxamine ?medicines for seizures like carbamazepine and phenobarbital ?mexiletine ?rifampin ?tacrine ?thiabendazole ?zileuton ?This list may not describe all possible interactions. Give your health care provider a list of all the medicines, herbs, non-prescription drugs, or dietary supplements you use. Also tell them if you smoke, drink alcohol, or  use illegal drugs. Some items may interact with your medicine. ?What should I watch for while using this medication? ?This drug may make you feel generally unwell. This is not uncommon, as chemotherapy can affect healthy cells as well as cancer cells. Report any side effects. Continue your course of treatment even though you feel ill unless your doctor tells you to stop. ?You may need blood work done while you are taking this medicine. ?Call your doctor or healthcare provider for advice if you get a fever, chills or sore throat, or other symptoms of a cold or flu. Do not treat yourself. This drug decreases your body's ability to fight infections. Try to avoid being around people who are sick. ?This medicine may cause serious skin reactions. They can happen weeks to months after starting the medicine. Contact your healthcare provider right away if you notice fevers or flu-like symptoms with a rash. The rash may be red or purple and then turn into blisters or peeling of the skin. Or, you might notice a red rash with swelling of the face, lips or lymph nodes in your neck or under your arms. ?In some patients, this medicine may cause a serious brain infection that may cause death. If you have any problems seeing, thinking, speaking, walking, or standing, tell your health care provider right away. If you cannot reach your health care provider, urgently seek other source of medical care. ?This medicine may increase your risk to bruise or bleed. Call your doctor or healthcare provider if you notice any unusual bleeding. ?Talk to your doctor about your risk of cancer. You may be more at risk for certain types of cancers if you take this medicine. ?This medicine may increase your risk of   skin cancer. Check your skin for changes to moles or for new growths while taking this medicine. Call your health care provider if you notice any of these skin changes. ?Do not become pregnant while taking this medicine or for at least 6  months after stopping it. Women should inform their doctor if they wish to become pregnant or think they might be pregnant. Men should not father a child while taking this medicine and for at least 3 months after stopping it. There is a potential for serious side effects to an unborn child. Talk to your healthcare provider or pharmacist for more information. Do not breast-feed an infant while taking this medicine or for at least 1 week after stopping it. ?This medicine may make it more difficult to father a child. You should talk with your doctor or healthcare provider if you are concerned about your fertility. ?What side effects may I notice from receiving this medication? ?Side effects that you should report to your doctor or health care professional as soon as possible: ?allergic reactions like skin rash, itching or hives, swelling of the face, lips, or tongue ?low blood counts - this medicine may decrease the number of white blood cells, red blood cells and platelets. You may be at increased risk for infections and bleeding. ?rash, fever, and swollen lymph nodes ?redness, blistering, peeling, or loosening of the skin, including inside the mouth ?signs of infection like fever or chills, cough, sore throat, pain or difficulty passing urine ?signs of decreased platelets or bleeding like bruising, pinpoint red spots on the skin, black, tarry stools, blood in the urine ?signs of decreased red blood cells like being unusually weak or tired, fainting spells, lightheadedness ?signs and symptoms of kidney injury like trouble passing urine or change in the amount of urine ?signs and symptoms of liver injury like dark yellow or brown urine; general ill feeling or flu-like symptoms; light-colored stools; loss of appetite; nausea; right upper belly pain; unusually weak or tired; yellowing of the eyes or skin ?Side effects that usually do not require medical attention (report to your doctor or health care professional if they  continue or are bothersome): ?constipation ?decreased appetite ?diarrhea ?headache ?mouth sores ?nausea, vomiting ?tiredness ?This list may not describe all possible side effects. Call your doctor for medical advice about side effects. You may report side effects to FDA at 1-800-FDA-1088. ?Where should I keep my medication? ?This drug is given in a hospital or clinic and will not be stored at home. ?NOTE: This sheet is a summary. It may not cover all possible information. If you have questions about this medicine, talk to your doctor, pharmacist, or health care provider. ?? 2023 Elsevier/Gold Standard (2020-04-25 00:00:00) ? ?

## 2022-04-10 NOTE — Progress Notes (Signed)
Hematology and Oncology Follow Up Visit  [name] [id number] 04-Jun-1946 76 y.o. 04/10/2022   Principle Diagnosis:  Right lower lobe pulmonary nodule- non-malignant Recurrent pulmonary embolism CLL -- progressive -- 11q-/13q- Chronic neutropenia secondary to chemotherapy/CLL   Past Therapy: Calquence 100 mg po BID -- start on 03/13/2020 -- d/c on 07/05/2021 Venetoclax 100 mg po q day -- change 07/05/2021 -- d/c on 07/05/2021   Current Therapy:   Rituxan/Bendamustine -- started on 01/03/2022, s/p cycle 3 -- Rituxan DC'd due to leukopenia on 03/28/2022 Coumadin 5 mg po q day Ziextenzo 6 mg SQ as indicated for low WBC count/ANC   Interim History:  [name] is here today for follow-up and treatment. He is doing well and has no complaints at this time.  Occasional fatigue.  IgG level 2 weeks ago was 570.  No fever, chills, n/v, cough, rash, dizziness, SOB, chest pain, palpitations, abdominal pain or changes in bowel or bladder habits.  No blood loss noted. No bruising or petechiae.  No swelling in his extremities.  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  No falls or syncope to report.  Appetite and hydration are good. Weight is stable at 181 lbs.   ECOG Performance Status: 1 - Symptomatic but completely ambulatory  Medications:  Allergies as of 04/10/2022       Reactions   Hydrocodone-acetaminophen Shortness Of Breath   Oxycodone Hcl Shortness Of Breath   Telmisartan-hctz Shortness Of Breath, Palpitations, Other (See Comments)   Dizziness also   Aspirin Rash, Other (See Comments)   Confusion, also- Cannot take high doses   Atorvastatin Other (See Comments)   Myalgia   Buspirone Hcl Other (See Comments)   Headaches   Codeine Hives, Nausea And Vomiting, Other (See Comments)   Confusion also   Morphine Sulfate Itching   Paroxetine Other (See Comments)   Paresthesias: R arm, R leg, r side of face   Rabeprazole Other (See Comments)    headache   Ramipril Other (See Comments)   Unknown reaction        Medication List        Accurate as of Apr 10, 2022 11:19 AM. If you have any questions, ask your nurse or doctor.          acyclovir 400 MG tablet Commonly known as: ZOVIRAX Take 1 tablet (400 mg total) by mouth daily.   [employer] S[employer] Relief 25 MG Chew Generic drug: Meclizine HCl CHEW 1 TABLET EVERY DAY AS NEEDED FOR DIZZINESS   docusate sodium 100 MG capsule Commonly known as: COLACE Take 100 mg by mouth daily as needed (constipation).   ipratropium 0.03 % nasal spray Commonly known as: ATROVENT Place 2 sprays into both nostrils every 12 (twelve) hours. What changed:  when to take this reasons to take this   LORazepam 0.5 MG tablet Commonly known as: ATIVAN Take 1 tablet (0.5 mg total) by mouth at bedtime as needed for anxiety (can take 1/4 or 1/2 tablet  if effective.).   omeprazole 40 MG capsule Commonly known as: PRILOSEC Take 40 mg by mouth every evening.   ondansetron 8 MG tablet Commonly known as: Zofran Take 1 tablet (8 mg total) by mouth 2 (two) times daily as needed for refractory nausea / vomiting. Start on day 2 after bendamustine chemo.   prochlorperazine 10 MG tablet Commonly known as: COMPAZINE Take 1 tablet (10 mg total) by mouth every 6 (six) hours as needed (Nausea or vomiting).  rosuvastatin 5 MG tablet Commonly known as: CRESTOR Take 5 mg by mouth daily.   sertraline 100 MG tablet Commonly known as: ZOLOFT Take 100 mg by mouth every morning.   sodium chloride 0.65 % Soln nasal spray Commonly known as: OCEAN Place 1 spray into both nostrils as needed for congestion.   Vitamin D3 50 MCG (2000 UT) capsule Take 2,000 Units by mouth every morning.   warfarin 5 MG tablet Commonly known as: COUMADIN Take as directed by the anticoagulation clinic. If you are unsure how to take this medication, talk to your nurse or doctor. Original instructions: TAKE AS DIRECTED  BY COUMADIN CLINIC        Allergies:  Allergies  Allergen Reactions   Hydrocodone-Acetaminophen Shortness Of Breath   Oxycodone Hcl Shortness Of Breath   Telmisartan-Hctz Shortness Of Breath, Palpitations and Other (See Comments)    Dizziness also   Aspirin Rash and Other (See Comments)    Confusion, also- Cannot take high doses   Atorvastatin Other (See Comments)    Myalgia    Buspirone Hcl Other (See Comments)    Headaches    Codeine Hives, Nausea And Vomiting and Other (See Comments)    Confusion also   Morphine Sulfate Itching   Paroxetine Other (See Comments)    Paresthesias: R arm, R leg, r side of face   Rabeprazole Other (See Comments)    headache   Ramipril Other (See Comments)    Unknown reaction    Past Medical History, Surgical history, Social history, and Family History were reviewed and updated.  Review of Systems: All other 10 point review of systems is negative.   Physical Exam:  weight is 181 lb (82.1 kg). His oral temperature is 98.5 F (36.9 C). His blood pressure is 127/75 and his pulse is 68. His respiration is 17 and oxygen saturation is 100%.   Wt Readings from Last 3 Encounters:  04/10/22 181 lb (82.1 kg)  03/28/22 177 lb (80.3 kg)  03/11/22 172 lb 12.8 oz (78.4 kg)    Ocular: Sclerae unicteric, pupils equal, round and reactive to light Ear-nose-throat: Oropharynx clear, dentition fair Lymphatic: No cervical or supraclavicular adenopathy Lungs no rales or rhonchi, good excursion bilaterally Heart regular rate and rhythm, no murmur appreciated Abd soft, nontender, positive bowel sounds MSK no focal spinal tenderness, no joint edema Neuro: non-focal, well-oriented, appropriate affect Breasts: Deferred   Lab Results  Component Value Date   WBC 2.2 (L) 04/10/2022   HGB 11.6 (L) 04/10/2022   HCT 36.0 (L) 04/10/2022   MCV 99.4 04/10/2022   PLT 146 (L) 04/10/2022   Lab Results  Component Value Date   FERRITIN 2,286 (H) 12/20/2021    IRON 81 12/20/2021   TIBC 307 12/20/2021   UIBC 226 12/20/2021   IRONPCTSAT 26 12/20/2021   Lab Results  Component Value Date   RETICCTPCT 3.0 12/20/2021   RBC 3.62 (L) 04/10/2022   RETICCTABS 51.79 05/06/2011   Lab Results  Component Value Date   KPAFRELGTCHN 20.0 (H) 03/28/2022   LAMBDASER 9.5 03/28/2022   KAPLAMBRATIO 2.11 (H) 03/28/2022   Lab Results  Component Value Date   IGGSERUM 570 (L) 03/28/2022   IGA 55 (L) 03/28/2022   IGMSERUM 28 03/28/2022   Lab Results  Component Value Date   TOTALPROTELP 6.0 03/28/2022   ALBUMINELP 4.1 03/28/2022   A1GS 0.2 03/28/2022   A2GS 0.5 03/28/2022   BETS 0.9 03/28/2022   GAMS 0.4 03/28/2022   MSPIKE Not Observed 03/28/2022  SPEI Comment 07/10/2021     Chemistry      Component Value Date/Time   NA 137 03/28/2022 0855   NA 145 07/22/2017 0756   NA 139 06/27/2016 1410   K 4.8 03/28/2022 0855   K 4.3 07/22/2017 0756   K 5.1 06/27/2016 1410   CL 102 03/28/2022 0855   CL 105 07/22/2017 0756   CO2 29 03/28/2022 0855   CO2 30 07/22/2017 0756   CO2 28 06/27/2016 1410   BUN 27 (H) 03/28/2022 0855   BUN 17 07/22/2017 0756   BUN 20.9 06/27/2016 1410   CREATININE 1.29 (H) 03/28/2022 0855   CREATININE 1.6 (H) 07/22/2017 0756   CREATININE 1.6 (H) 06/27/2016 1410      Component Value Date/Time   CALCIUM 9.6 03/28/2022 0855   CALCIUM 9.6 07/22/2017 0756   CALCIUM 9.6 06/27/2016 1410   ALKPHOS 83 03/28/2022 0855   ALKPHOS 59 07/22/2017 0756   ALKPHOS 62 06/27/2016 1410   AST 18 03/28/2022 0855   AST 18 06/27/2016 1410   ALT 15 03/28/2022 0855   ALT 24 07/22/2017 0756   ALT 24 06/27/2016 1410   BILITOT 0.4 03/28/2022 0855   BILITOT 0.46 06/27/2016 1410       Impression and Plan: Mr. Rawl is a pleasant 76 yo African American gentleman with recurrent CLL and chronic neutropenia secondary to previous chemotherapy.  He is doing well at this time.  [name] has discontinued the Rituxan. We will proceed with Bendamustine  today and tomorrow followed by Ziextenzo to help with his WBC count.  Follow-up in 4 weeks.   Lottie Dawson, NP 5/31/202311:19 AM

## 2022-04-11 ENCOUNTER — Inpatient Hospital Stay: Payer: No Typology Code available for payment source | Attending: Hematology & Oncology

## 2022-04-11 VITALS — BP 124/72 | HR 77 | Temp 98.1°F | Resp 16

## 2022-04-11 DIAGNOSIS — D701 Agranulocytosis secondary to cancer chemotherapy: Secondary | ICD-10-CM | POA: Insufficient documentation

## 2022-04-11 DIAGNOSIS — Z5189 Encounter for other specified aftercare: Secondary | ICD-10-CM | POA: Diagnosis not present

## 2022-04-11 DIAGNOSIS — Z5111 Encounter for antineoplastic chemotherapy: Secondary | ICD-10-CM | POA: Diagnosis present

## 2022-04-11 DIAGNOSIS — T451X5A Adverse effect of antineoplastic and immunosuppressive drugs, initial encounter: Secondary | ICD-10-CM | POA: Insufficient documentation

## 2022-04-11 DIAGNOSIS — C9110 Chronic lymphocytic leukemia of B-cell type not having achieved remission: Secondary | ICD-10-CM | POA: Diagnosis present

## 2022-04-11 MED ORDER — SODIUM CHLORIDE 0.9 % IV SOLN: Freq: Once | INTRAVENOUS | Status: AC

## 2022-04-11 MED ORDER — SODIUM CHLORIDE 0.9 % IV SOLN
64.8000 mg/m2 | Freq: Once | INTRAVENOUS | Status: AC
  Administered 2022-04-11: 125 mg via INTRAVENOUS
  Filled 2022-04-11: qty 5

## 2022-04-11 MED ORDER — SODIUM CHLORIDE 0.9 % IV SOLN
10.0000 mg | Freq: Once | INTRAVENOUS | Status: AC
  Administered 2022-04-11: 10 mg via INTRAVENOUS
  Filled 2022-04-11: qty 10

## 2022-04-11 NOTE — Progress Notes (Signed)
[name] moved from supportive care plan to day 3 of treatment plan per Dr. Antonieta Pert instructions.

## 2022-04-11 NOTE — Patient Instructions (Signed)
[facility]  Discharge Instructions: ?Thank you for choosing [facility] to provide your oncology and hematology care.  ? ?If you have a lab appointment with the Cancer Center, please go directly to the Cancer Center and check in at the registration area. ? ?Wear comfortable clothing and clothing appropriate for easy access to any Portacath or PICC line.  ? ?We strive to give you quality time with your provider. You may need to reschedule your appointment if you arrive late (15 or more minutes).  Arriving late affects you and other patients whose appointments are after yours.  Also, if you miss three or more appointments without notifying the office, you may be dismissed from the clinic at the provider?s discretion.    ?  ?For prescription refill requests, have your pharmacy contact our office and allow 72 hours for refills to be completed.   ? ?Today you received the following chemotherapy and/or immunotherapy agents Bendeka    ?  ?To help prevent nausea and vomiting after your treatment, we encourage you to take your nausea medication as directed. ? ?BELOW ARE SYMPTOMS THAT SHOULD BE REPORTED IMMEDIATELY: ?*FEVER GREATER THAN 100.4 F (38 ?C) OR HIGHER ?*CHILLS OR SWEATING ?*NAUSEA AND VOMITING THAT IS NOT CONTROLLED WITH YOUR NAUSEA MEDICATION ?*UNUSUAL SHORTNESS OF BREATH ?*UNUSUAL BRUISING OR BLEEDING ?*URINARY PROBLEMS (pain or burning when urinating, or frequent urination) ?*BOWEL PROBLEMS (unusual diarrhea, constipation, pain near the anus) ?TENDERNESS IN MOUTH AND THROAT WITH OR WITHOUT PRESENCE OF ULCERS (sore throat, sores in mouth, or a toothache) ?UNUSUAL RASH, SWELLING OR PAIN  ?UNUSUAL VAGINAL DISCHARGE OR ITCHING  ? ?Items with * indicate a potential emergency and should be followed up as soon as possible or go to the Emergency Department if any problems should occur. ? ?Please show the CHEMOTHERAPY ALERT CARD or IMMUNOTHERAPY ALERT CARD at check-in to the  Emergency Department and triage nurse. ?Should you have questions after your visit or need to cancel or reschedule your appointment, please contact [facility]  [phone] and follow the prompts.  Office hours are 8:00 a.m. to 4:30 p.m. Monday - Friday. Please note that voicemails left after 4:00 p.m. may not be returned until the following business day.  We are closed weekends and major holidays. You have access to a nurse at all times for urgent questions. Please call the main number to the clinic [phone] and follow the prompts. ? ?For any non-urgent questions, you may also contact your provider using MyChart. We now offer e-Visits for anyone 18 and older to request care online for non-urgent symptoms. For details visit [email or web address]. ?  ?Also download the MyChart app! Go to the app store, search "MyChart", open the app, select , and log in with your MyChart username and password. ? ?Due to Covid, a mask is required upon entering the hospital/clinic. If you do not have a mask, one will be given to you upon arrival. For doctor visits, patients may have 1 support person aged 18 or older with them. For treatment visits, patients cannot have anyone with them due to current Covid guidelines and our immunocompromised population.  ?

## 2022-04-12 ENCOUNTER — Inpatient Hospital Stay: Payer: No Typology Code available for payment source

## 2022-04-12 VITALS — BP 101/57 | HR 85 | Temp 97.9°F | Resp 17

## 2022-04-12 DIAGNOSIS — Z5111 Encounter for antineoplastic chemotherapy: Secondary | ICD-10-CM | POA: Diagnosis not present

## 2022-04-12 DIAGNOSIS — C9110 Chronic lymphocytic leukemia of B-cell type not having achieved remission: Secondary | ICD-10-CM

## 2022-04-12 LAB — IGG, IGA, IGM
IgA: 52 mg/dL — ABNORMAL LOW (ref 61–437)
IgG (Immunoglobin G), Serum: 589 mg/dL — ABNORMAL LOW (ref 603–1613)
IgM (Immunoglobulin M), Srm: 32 mg/dL (ref 15–143)

## 2022-04-12 MED ORDER — PEGFILGRASTIM-BMEZ 6 MG/0.6ML ~~LOC~~ SOSY
6.0000 mg | PREFILLED_SYRINGE | Freq: Once | SUBCUTANEOUS | Status: AC
  Administered 2022-04-12: 6 mg via SUBCUTANEOUS
  Filled 2022-04-12: qty 0.6

## 2022-04-12 NOTE — Patient Instructions (Signed)

## 2022-04-16 ENCOUNTER — Encounter: Payer: Self-pay | Admitting: Hematology & Oncology

## 2022-04-16 ENCOUNTER — Ambulatory Visit (INDEPENDENT_AMBULATORY_CARE_PROVIDER_SITE_OTHER): Payer: Medicare Other | Admitting: Pharmacist

## 2022-04-16 ENCOUNTER — Telehealth: Payer: Self-pay | Admitting: *Deleted

## 2022-04-16 ENCOUNTER — Ambulatory Visit: Payer: Medicare Other

## 2022-04-16 DIAGNOSIS — Z952 Presence of prosthetic heart valve: Secondary | ICD-10-CM

## 2022-04-16 DIAGNOSIS — I4891 Unspecified atrial fibrillation: Secondary | ICD-10-CM

## 2022-04-16 DIAGNOSIS — I2699 Other pulmonary embolism without acute cor pulmonale: Secondary | ICD-10-CM

## 2022-04-16 DIAGNOSIS — Z5181 Encounter for therapeutic drug level monitoring: Secondary | ICD-10-CM | POA: Diagnosis not present

## 2022-04-16 LAB — POCT INR: INR: 1.8 — AB (ref 2.0–3.0)

## 2022-04-16 NOTE — Patient Instructions (Signed)
Description   Take 3 tablets of warfarin today and then start taking warfarin 2 tablets daily except 3 tablets on Sundays. Recheck INR in 2 weeks. Coumadin Clinic [phone]

## 2022-04-16 NOTE — Telephone Encounter (Signed)
Called and was unable to lvm - mailbox is full - mailed calendar Transport planner)

## 2022-04-18 ENCOUNTER — Ambulatory Visit: Payer: Medicare Other | Admitting: Psychology

## 2022-04-22 NOTE — Progress Notes (Signed)
Date:  04/25/2022   ID:  [name], DOB September 10, 1946, MRN [id number]   PCP:  [name] Olp, MD  Cardiologist:   [name] Electrophysiologist:  None   Evaluation Performed:  Follow-Up Visit  Chief Complaint:  AVR PAF   History of Present Illness:    76 y.o. with bioprosthetic AVR Dr [name] at [facility] July 2011 for severe AR No CAD. History of PAF and recurrent DVT/PE on chronic anticoagulation  Also had LUE cephalic/basilic thrombus. CRF;s include HTN and HLD. Has CLL followed by Dr [name] post chemoRx. Also had radical prostatectomy for cancer In 2016 had RLL wedge resection for granulomatous benign nodule Has had sleep issues with nightmares and we have since stopped his beta blocker including lopressor and atenolol He is seen at [facility] for [facility] and uses Zoloft   Had right foot reconstruction 03/02/19 involving the right gastrocnemius tendon for foot drop  Echo 08/27/19 showed increased gradients on his 25 mm CE bioprosthesis with CT showing HALT/HAM started on coumadin instead of xarelto Concern related to progression of his CLL and marantic. On coumadin gradients improved somewhat with mean 39-30 mmHg peak 65-55 mmHg by TTE 12/15/19 Echo 12/13/20 stable with mean gradient 27.5 and peak 46.2 mmHg DVI 0.24 AVA 1.2 cm2   Seen by PA 03/10/20 with dyspnea and palpitations Thought to be anxiety related  Soft tissue CT neck 03/15/20 with moderate bulky cervical lymphadenopathy new/progressed since 2019 Seen by Dr [name] 09/21/20 felt the venetoclax and acalabrutnib doing good job bringing lymphocyte count down from 91% to 51%  F/U CT  10/18/20 with significant improvement He is due to have BM biopsy in August   07/09/21-07/13/21 admitted with nosebleed, fever, chills with staph hominis bacteremia ? 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 in [place]. 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 Marin Bell (most recent 07/2017)   History of prostate cancer 2003   Rad prost   History of pulmonary embolism 2011; 10/2014   Recurrence off of anticoagulation 10/2014: per hem/onc (Dr. Marin Bell) pt needs lifelong anticoagulation (hypercoag w/u neg).   Hyperlipidemia    statin-intolerant, except for crestor low dose.   Hypertension    Migraine syndrome    Mitral regurgitation    PAF (paroxysmal atrial fibrillation) (HCC)    Panic attacks    "           "             "                  "                  "                       "                             "  Pulmonary nodule, right 2015   RLL: resected at [facility] --V[facility]; Benign RLL nodule (fungal and AFB stains neg).  Plan is for [facility] to do f/u o/v & CT chest 1 yr    Right-sided headache 07/2016   Primary stabbing HA or migraine per neuro (Dr. Tomi Likens).  Gabapentin and topamax not tolerated.  These resolved spontaneously.   Past Surgical History:  Procedure Laterality Date   AORTIC VALVE REPLACEMENT  2011   [place]  (bioprosthetic)   CARDIAC CATHETERIZATION  04/2010   Normal coronaries.   Carotid dopplers   08/2015   NORMAL   CATARACT EXTRACTION     L 12/06/09   R 09/14/09   COLONOSCOPY  01/26/13   tic's, o/w normal.  ([facility])--recall 10 yrs.   PENILE PROSTHESIS IMPLANT     PROSTATECTOMY  04/2002   for prostate cancer   Pulmonary nodule resection  Fall/winter 2016   [facility]   SHOULDER SURGERY     rotator cuff on both arms    TEE WITHOUT CARDIOVERSION N/A 07/13/2021   Procedure: TRANSESOPHAGEAL ECHOCARDIOGRAM (TEE);  Surgeon: Pixie Casino, MD;  Location: [facility] ENDOSCOPY;  Service: Cardiovascular;  Laterality: N/A;   TIBIALIS TENDON TRANSFER / REPAIR Right 03/02/2019   Procedure: RECONSTRUCTION OF RIGHT  ANTERIOR TIBIALIS TENDON;  Surgeon: Erle Crocker, MD;  Location: [place];  Service: Orthopedics;  Laterality: Right;   TRANSTHORACIC ECHOCARDIOGRAM  09/20/2014; 03/2016; 03/2017   2015-mild LVH, EF 50-55%, wall motion nl, grade I diast dysfxn, prosth aort valve good,transaortic gradients decreased compared to echo 11/2013.   03/2016: EF 55-60%, normal LV function, severe LVH, normal diast fxn, mild increase in AV gradient compared to 09/2014 echo.  2018: EF 55-60%, normal LV fxn, grd II DD, AV stable/gradient's stable.     Current Meds  Medication Sig   acyclovir (ZOVIRAX) 400 MG tablet Take 1 tablet (400 mg total) by mouth daily.   Cholecalciferol (VITAMIN D3) 50 MCG (2000 UT) capsule Take 2,000 Units by mouth every morning.   [employer] MOTION SICKNESS RELIEF 25 MG CHEW CHEW 1 TABLET EVERY DAY AS NEEDED FOR DIZZINESS   docusate sodium (COLACE) 100 MG capsule Take 100 mg by mouth daily as needed (constipation).   ipratropium (ATROVENT) 0.03 % nasal spray Place 2 sprays into both nostrils every 12 (twelve) hours. (Patient taking differently: Place 2 sprays into both nostrils 2 (two) times daily as needed for rhinitis (congestion).)   LORazepam (ATIVAN) 0.5 MG tablet Take 1 tablet (0.5 mg total) by mouth at bedtime as needed for anxiety (can take 1/4 or 1/2 tablet  if effective.).    omeprazole (PRILOSEC) 40 MG capsule Take 40 mg by mouth every evening.   ondansetron (ZOFRAN) 8 MG tablet Take 1 tablet (8 mg total) by mouth 2 (two) times daily as needed for refractory nausea / vomiting. Start on day 2 after bendamustine chemo.   prochlorperazine (COMPAZINE) 10 MG tablet Take 1 tablet (10 mg total) by mouth every 6 (six) hours as needed (Nausea or vomiting).   rosuvastatin (CRESTOR) 5 MG tablet Take 5 mg by mouth daily.   sertraline (ZOLOFT) 100 MG tablet Take 100 mg by mouth every morning.   sodium chloride (OCEAN) 0.65 % SOLN nasal spray Place 1 spray into both nostrils as needed for congestion.   warfarin (COUMADIN) 5 MG tablet TAKE AS DIRECTED BY COUMADIN CLINIC     Allergies:   Hydrocodone-acetaminophen, Oxycodone hcl, Telmisartan-hctz, Aspirin, Atorvastatin, Buspirone hcl, Codeine, Morphine sulfate, Paroxetine, Rabeprazole,  and Ramipril   Social History   Tobacco Use   Smoking status: Former    Packs/day: 1.00    Years: 12.00    Total pack years: 12.00    Types: Cigarettes    Start date: 11/28/1968    Quit date: 01/29/1981    Years since quitting: 41.2   Smokeless tobacco: Never   Tobacco comments:    quit 76 yo   Vaping Use   Vaping Use: Never used  Substance Use Topics   Alcohol use: Not Currently    Comment: occasional   Drug use: No     Family Hx: The patient's family history includes Cancer in his sister; Colon cancer (age of onset: 35) in his sister; Prostate cancer in his father; Stroke in his brother, mother, and sister. There is no history of Heart attack.  ROS:   Please see the history of present illness.     All other systems reviewed and are negative.   Prior CV studies:   The following studies were reviewed today:  Echo 04/08/18  EF 60-65% mild MR moderate LAE stable mean gradient through AVR 18 mmHg peak 31 mmHg DVI 0.32   Labs/Other Tests and Data Reviewed:    EKG:  04/25/2022 SR rate 76 RBBB/LAFB    Recent Labs: 04/10/2022: ALT  14; BUN 24; Creatinine 1.27; Hemoglobin 11.6; Platelet Count 146; Potassium 4.9; Sodium 140   Recent Lipid Panel Lab Results  Component Value Date/Time   CHOL 170 05/18/2021 02:27 PM   TRIG 238.0 (H) 05/18/2021 02:27 PM   HDL 35.80 (L) 05/18/2021 02:27 PM   CHOLHDL 5 05/18/2021 02:27 PM   LDLCALC 110 (H) 05/12/2020 08:40 AM   LDLDIRECT 95.0 05/18/2021 02:27 PM    Wt Readings from Last 3 Encounters:  04/25/22 182 lb (82.6 kg)  04/10/22 181 lb (82.1 kg)  03/28/22 177 lb (80.3 kg)     Objective:    Vital Signs:  BP 102/68   Pulse 71   Ht $R'5\' 10"'HA$  (1.778 m)   Wt 182 lb (82.6 kg)   SpO2 97%   BMI 26.11 kg/m    Affect appropriate Frail elderly male with low body weight HEENT: normal Neck supple with no adenopathy JVP normal no bruits no thyromegaly Lungs clear with no wheezing and good diaphragmatic motion Heart:  S1/S2 SEM  murmur, no rub, gallop or click PMI normal post sternotomy  Abdomen: benighn, BS positve, no tenderness, no AAA no bruit.  No HSM or HJR Distal pulses intact with no bruits No edema Neuro non-focal Skin warm and dry Previous right foot reconstruction with neuropathy     ASSESSMENT & PLAN:    AVR:  2011 25 mm CE. Marked increase in gradients on echo 08/27/19 with cardiac CT showeing HALT/HAM despite being on xarelto. Changed to coumadin. TEE 07/13/21 stable no SBE INR 1.8 on 04/16/22 dose adjusted    History of DVT (deep vein thrombosis) and Recurrent pulmonary embolism Continue coumadin . This is managed by heme/onc.    Paroxysmal atrial fibrillation Maintaining NSR.  Continue  Coumadin     Essential hypertension Borderline control.  Continue to monitor.   HLD (hyperlipidemia) Has had leg pain with statins in past. Taking crestor 3 x/week    Lung nodule  [facility]  Recent CT see above f/u [facility]    Carotid Bruit:  Left duplex no stenosis f/u 08/2019    Depression:  Better off paroxetine now on low dose prozac with tofranil  Headache:  F/u primary PRN tylenol ok has schrap metal on right side of head Cant have MRI   LKJ:ZPHX with primary typically Cr around 1.27 04/10/22    BBB:  RBBB LAD post AVR ECG q 6 months follow for more advanced AV block    CLL:  F/u with Dr [name]  new Rx started for progression WBC only 2.2 04/10/22    Insomnia:  Improved off beta blocker and on zoloft   ENT:  Hoarseness ? Related to CLL f/u ENT     Medication Adjustments/Labs and Tests Ordered: Current medicines are reviewed at length with the patient today.  Concerns regarding medicines are outlined above.   Tests Ordered:  None   Medication Changes:  None   Disposition:  F/u with me in 6 months    Signed, [place], MD  04/25/2022 9:32 AM    [place]

## 2022-04-25 ENCOUNTER — Encounter: Payer: Self-pay | Admitting: Cardiovascular Disease

## 2022-04-25 ENCOUNTER — Ambulatory Visit: Payer: No Typology Code available for payment source | Admitting: Cardiovascular Disease

## 2022-04-25 VITALS — BP 102/68 | HR 71 | Ht 70.0 in | Wt 182.0 lb

## 2022-04-25 DIAGNOSIS — C9110 Chronic lymphocytic leukemia of B-cell type not having achieved remission: Secondary | ICD-10-CM | POA: Diagnosis not present

## 2022-04-25 DIAGNOSIS — I4891 Unspecified atrial fibrillation: Secondary | ICD-10-CM

## 2022-04-25 DIAGNOSIS — Z952 Presence of prosthetic heart valve: Secondary | ICD-10-CM

## 2022-04-25 NOTE — Patient Instructions (Signed)

## 2022-04-29 ENCOUNTER — Ambulatory Visit (INDEPENDENT_AMBULATORY_CARE_PROVIDER_SITE_OTHER): Payer: Medicare Other | Admitting: Psychology

## 2022-04-29 DIAGNOSIS — F4323 Adjustment disorder with mixed anxiety and depressed mood: Secondary | ICD-10-CM | POA: Diagnosis not present

## 2022-04-29 NOTE — Progress Notes (Signed)
Date: 04/29/2022  Treatment Plan: Diagnosis F99 (Unspecified mental disorder) [n/a]  309.28 (Adjustment disorder with mixed anxiety and depressed mood) [n/a]  Symptoms Depressed or irritable mood. (Status: maintained) -- No Description Entered  Diminished interest in or enjoyment of activities. (Status: maintained) -- No Description Entered  Experiences disturbing and persistent thoughts, images, and/or perceptions of the traumatic event. (Status: maintained) -- No Description Entered  Has been exposed to a traumatic event involving actual or perceived threat of death or serious injury. (Status: maintained) -- No Description Entered  Intentionally avoids thoughts, feelings, or discussions related to the traumatic event. (Status: maintained) -- No Description Entered  Lack of energy. (Status: maintained) -- No Description Entered  Sad affect, social withdrawal, anxiety, loss of interest in activities, and low energy. (Status: maintained) -- No Description Entered  Symptoms present more than one month. (Status: maintained) -- No Description Entered  Medication Status compliance  Safety none  If Suicidal or Homicidal State Action Taken: unspecified  Current Risk: low Medications Lorazepam (Dosage: .'5mg'$ )  Meclizine (Dosage: unknown)  Sertrline (Dosage: '100mg'$ )  Xarelto (Dosage: unknown)  Objectives Related Problem: Develop healthy thinking patterns and beliefs about self, others, and the world that lead to the alleviation and help prevent the relapse of depression. Description: Implement mindfulness techniques for relapse prevention. Target Date: 2022-07-08 Frequency: Daily Modality: individual Progress: 50%  Related Problem: Develop healthy thinking patterns and beliefs about self, others, and the world that lead to the alleviation and help prevent the relapse of depression. Description: Learn and implement conflict resolution skills to resolve interpersonal problems. Target  Date: 2022-07-08 Frequency: Daily Modality: individual Progress: 60%  Related Problem: Develop healthy thinking patterns and beliefs about self, others, and the world that lead to the alleviation and help prevent the relapse of depression. Description: Learn and implement behavioral strategies to overcome depression. Target Date: 2022-07-08 Frequency: Daily Modality: individual Progress: 50%  Related Problem: Develop healthy thinking patterns and beliefs about self, others, and the world that lead to the alleviation and help prevent the relapse of depression. Description: Describe current and past experiences with depression including their impact on functioning and attempts to resolve it. Target Date: 2022-07-08 Frequency: Daily Modality: individual Progress: 60%  Client Response full compliance  Service Location Location, [address] [place], [place] [address]  Service Code cpt [phone]  Self care activities  Emotion regulation skills  Distress tolerance skill  Validate/empathize  Identified an insight  Facilitate problem solving  Normalize/Reframe  Comments  Dx: F43.21  [name] agreed to having a video session due to the pandemic. He was at home and I was in my home office.  Meds: Sertraline ('100mg'$ ), Vit. D, Meclizine, blood pressure med, cholesterol med, Lorazepam (.5 mg as needed).  Goals: Seeking relief to depressive feelings of sadness and lack of motivation/interest in activities. Also, has some anxieties related to health and his own mortality. Goal Date: 06-11-22   [name] says that the past couple of weeks have been good, but the previous month "was hell". His tests in May showed that his protocol was not working (blood counts down) and they stopped infusions. Chemo stopped and after a month he felt "fantastic". He is now on a very light dosage of chemo and quality of life is far greater. He states that the quality of life was so low when on the higher doses of chemo that he  questioned whether he wanted to live. He was not suicidal, but was not interested  in continuing to live under those circumstances. He is feeling gratitude at this time and is much more emotionally stable.                        [name], PhD  Time: 10:45a-11:30 45 minutes

## 2022-04-30 ENCOUNTER — Other Ambulatory Visit: Payer: Self-pay | Admitting: Hematology & Oncology

## 2022-04-30 ENCOUNTER — Ambulatory Visit (INDEPENDENT_AMBULATORY_CARE_PROVIDER_SITE_OTHER): Payer: Medicare Other | Admitting: *Deleted

## 2022-04-30 DIAGNOSIS — Z5181 Encounter for therapeutic drug level monitoring: Secondary | ICD-10-CM | POA: Diagnosis not present

## 2022-04-30 DIAGNOSIS — Z952 Presence of prosthetic heart valve: Secondary | ICD-10-CM | POA: Diagnosis not present

## 2022-04-30 DIAGNOSIS — C9110 Chronic lymphocytic leukemia of B-cell type not having achieved remission: Secondary | ICD-10-CM

## 2022-04-30 DIAGNOSIS — I2699 Other pulmonary embolism without acute cor pulmonale: Secondary | ICD-10-CM

## 2022-04-30 DIAGNOSIS — I4891 Unspecified atrial fibrillation: Secondary | ICD-10-CM | POA: Diagnosis not present

## 2022-04-30 LAB — POCT INR
INR: 1.8 — AB (ref 2.0–3.0)
INR: 1.8 — AB (ref 2.0–3.0)

## 2022-04-30 NOTE — Patient Instructions (Addendum)
Description   Take 3 tablets of warfarin today and then start taking warfarin 2 tablets daily except 3 tablets on Sundays and Thursdays. Recheck INR in 2 weeks. Coumadin Clinic [phone]

## 2022-05-02 ENCOUNTER — Encounter: Payer: Self-pay | Admitting: Family Medicine

## 2022-05-02 ENCOUNTER — Encounter: Payer: Self-pay | Admitting: Hematology & Oncology

## 2022-05-08 ENCOUNTER — Other Ambulatory Visit: Payer: Self-pay

## 2022-05-08 ENCOUNTER — Inpatient Hospital Stay: Payer: No Typology Code available for payment source

## 2022-05-08 ENCOUNTER — Inpatient Hospital Stay (HOSPITAL_BASED_OUTPATIENT_CLINIC_OR_DEPARTMENT_OTHER): Payer: No Typology Code available for payment source | Admitting: Family

## 2022-05-08 ENCOUNTER — Encounter: Payer: Self-pay | Admitting: Family

## 2022-05-08 ENCOUNTER — Encounter: Payer: Self-pay | Admitting: Family Medicine

## 2022-05-08 VITALS — BP 115/72

## 2022-05-08 VITALS — BP 118/76 | HR 60 | Temp 97.7°F | Resp 18 | Ht 70.0 in | Wt 180.0 lb

## 2022-05-08 DIAGNOSIS — D631 Anemia in chronic kidney disease: Secondary | ICD-10-CM

## 2022-05-08 DIAGNOSIS — C9110 Chronic lymphocytic leukemia of B-cell type not having achieved remission: Secondary | ICD-10-CM

## 2022-05-08 DIAGNOSIS — D509 Iron deficiency anemia, unspecified: Secondary | ICD-10-CM

## 2022-05-08 DIAGNOSIS — D469 Myelodysplastic syndrome, unspecified: Secondary | ICD-10-CM | POA: Diagnosis not present

## 2022-05-08 DIAGNOSIS — D709 Neutropenia, unspecified: Secondary | ICD-10-CM

## 2022-05-08 DIAGNOSIS — Z5111 Encounter for antineoplastic chemotherapy: Secondary | ICD-10-CM | POA: Diagnosis not present

## 2022-05-08 LAB — CMP (CANCER CENTER ONLY)
ALT: 16 U/L (ref 0–44)
AST: 20 U/L (ref 15–41)
Albumin: 4.6 g/dL (ref 3.5–5.0)
Alkaline Phosphatase: 71 U/L (ref 38–126)
Anion gap: 9 (ref 5–15)
BUN: 27 mg/dL — ABNORMAL HIGH (ref 8–23)
CO2: 26 mmol/L (ref 22–32)
Calcium: 9.5 mg/dL (ref 8.9–10.3)
Chloride: 104 mmol/L (ref 98–111)
Creatinine: 1.28 mg/dL — ABNORMAL HIGH (ref 0.61–1.24)
GFR, Estimated: 58 mL/min — ABNORMAL LOW (ref >60)
Glucose, Bld: 90 mg/dL (ref 70–99)
Potassium: 4.2 mmol/L (ref 3.5–5.1)
Sodium: 139 mmol/L (ref 135–145)
Total Bilirubin: 0.4 mg/dL (ref 0.3–1.2)
Total Protein: 6.6 g/dL (ref 6.5–8.1)

## 2022-05-08 LAB — CBC WITH DIFFERENTIAL (CANCER CENTER ONLY)
Abs Immature Granulocytes: 0.01 10*3/uL (ref 0.00–0.07)
Basophils Absolute: 0 10*3/uL (ref 0.0–0.1)
Basophils Relative: 1 %
Eosinophils Absolute: 0.1 10*3/uL (ref 0.0–0.5)
Eosinophils Relative: 4 %
HCT: 37.5 % — ABNORMAL LOW (ref 39.0–52.0)
Hemoglobin: 12.5 g/dL — ABNORMAL LOW (ref 13.0–17.0)
Immature Granulocytes: 1 %
Lymphocytes Relative: 61 %
Lymphs Abs: 1.3 10*3/uL (ref 0.7–4.0)
MCH: 32.1 pg (ref 26.0–34.0)
MCHC: 33.3 g/dL (ref 30.0–36.0)
MCV: 96.4 fL (ref 80.0–100.0)
Monocytes Absolute: 0.3 10*3/uL (ref 0.1–1.0)
Monocytes Relative: 13 %
Neutro Abs: 0.4 10*3/uL — CL (ref 1.7–7.7)
Neutrophils Relative %: 20 %
Platelet Count: 96 10*3/uL — ABNORMAL LOW (ref 150–400)
RBC: 3.89 MIL/uL — ABNORMAL LOW (ref 4.22–5.81)
RDW: 13.7 % (ref 11.5–15.5)
WBC Count: 2 10*3/uL — ABNORMAL LOW (ref 4.0–10.5)
nRBC: 0 % (ref 0.0–0.2)

## 2022-05-08 LAB — LACTATE DEHYDROGENASE: LDH: 332 U/L — ABNORMAL HIGH (ref 98–192)

## 2022-05-08 MED ORDER — PEGFILGRASTIM-BMEZ 6 MG/0.6ML ~~LOC~~ SOSY
6.0000 mg | PREFILLED_SYRINGE | Freq: Once | SUBCUTANEOUS | Status: AC
  Administered 2022-05-08: 6 mg via SUBCUTANEOUS
  Filled 2022-05-08: qty 0.6

## 2022-05-08 NOTE — Progress Notes (Signed)
Hematology and Oncology Follow Up Visit  [name] [id number] 12-28-1945 76 y.o. 05/08/2022   Principle Diagnosis:  Right lower lobe pulmonary nodule- non-malignant Recurrent pulmonary embolism CLL -- progressive -- 11q-/13q- Chronic neutropenia secondary to chemotherapy/CLL   Past Therapy: Calquence 100 mg po BID -- start on 03/13/2020 -- d/c on 07/05/2021 Venetoclax 100 mg po q day -- change 07/05/2021 -- d/c on 07/05/2021   Current Therapy:   Rituxan/Bendamustine -- started on 01/03/2022, s/p cycle 3 -- Rituxan DC'd due to leukopenia on 03/28/2022 Coumadin 5 mg po q day Ziextenzo 6 mg SQ as indicated for low WBC count/ANC    Interim History:  [name] is here today for follow-up and treatment. He is doing well but states that he had food poisoning last week with n/v/d. His symptoms have since resolved.  His WBC count is 2.0, ANC 0.4, Hgb 12.5 and platelets 96.  No fever, chills, n/v, cough, rash, dizziness, SOB, chest pain, palpitations, abdominal pain or changes in bowel or bladder habits at this time.  No fatigue.  He has been teach and practicing Tai Chi. He also enjoys walking and swimming as well.  No swelling in his extremities. Neuropathy in the right foot unchanged from baseline.  He has intermittent pain in the joints of his hands. This comes and goes.  No falls or syncope to report.  His weight is stable at 180 lbs. Appetite and hydration have been good.   ECOG Performance Status: 1 - Symptomatic but completely ambulatory  Medications:  Allergies as of 05/08/2022       Reactions   Hydrocodone-acetaminophen Shortness Of Breath   Oxycodone Hcl Shortness Of Breath   Telmisartan-hctz Shortness Of Breath, Palpitations, Other (See Comments)   Dizziness also   Aspirin Rash, Other (See Comments)   Confusion, also- Cannot take high doses   Atorvastatin Other (See Comments)   Myalgia   Buspirone Hcl Other (See Comments)   Headaches   Codeine Hives, Nausea And  Vomiting, Other (See Comments)   Confusion also   Morphine Sulfate Itching   Paroxetine Other (See Comments)   Paresthesias: R arm, R leg, r side of face   Rabeprazole Other (See Comments)   headache   Ramipril Other (See Comments)   Unknown reaction        Medication List        Accurate as of May 08, 2022 10:59 AM. If you have any questions, ask your nurse or doctor.          acyclovir 400 MG tablet Commonly known as: ZOVIRAX TAKE 1 TABLET BY MOUTH EVERY DAY   [employer] Motion Sickness Relief 25 MG Chew Generic drug: Meclizine HCl CHEW 1 TABLET EVERY DAY AS NEEDED FOR DIZZINESS   docusate sodium 100 MG capsule Commonly known as: COLACE Take 100 mg by mouth daily as needed (constipation).   ipratropium 0.03 % nasal spray Commonly known as: ATROVENT Place 2 sprays into both nostrils every 12 (twelve) hours. What changed:  when to take this reasons to take this   LORazepam 0.5 MG tablet Commonly known as: ATIVAN Take 1 tablet (0.5 mg total) by mouth at bedtime as needed for anxiety (can take 1/4 or 1/2 tablet  if effective.).   omeprazole 40 MG capsule Commonly known as: PRILOSEC Take 40 mg by mouth every evening.   ondansetron 8 MG tablet Commonly known as: Zofran Take 1 tablet (8 mg total) by mouth 2 (two) times daily as needed for refractory nausea /  vomiting. Start on day 2 after bendamustine chemo.   prochlorperazine 10 MG tablet Commonly known as: COMPAZINE Take 1 tablet (10 mg total) by mouth every 6 (six) hours as needed (Nausea or vomiting).   rosuvastatin 5 MG tablet Commonly known as: CRESTOR Take 5 mg by mouth daily.   sertraline 100 MG tablet Commonly known as: ZOLOFT Take 100 mg by mouth every morning.   sodium chloride 0.65 % Soln nasal spray Commonly known as: OCEAN Place 1 spray into both nostrils as needed for congestion.   Vitamin D3 50 MCG (2000 UT) capsule Take 2,000 Units by mouth every morning.   warfarin 5 MG tablet Commonly  known as: COUMADIN Take as directed by the anticoagulation clinic. If you are unsure how to take this medication, talk to your nurse or doctor. Original instructions: TAKE AS DIRECTED BY COUMADIN CLINIC        Allergies:  Allergies  Allergen Reactions   Hydrocodone-Acetaminophen Shortness Of Breath   Oxycodone Hcl Shortness Of Breath   Telmisartan-Hctz Shortness Of Breath, Palpitations and Other (See Comments)    Dizziness also   Aspirin Rash and Other (See Comments)    Confusion, also- Cannot take high doses   Atorvastatin Other (See Comments)    Myalgia    Buspirone Hcl Other (See Comments)    Headaches    Codeine Hives, Nausea And Vomiting and Other (See Comments)    Confusion also   Morphine Sulfate Itching   Paroxetine Other (See Comments)    Paresthesias: R arm, R leg, r side of face   Rabeprazole Other (See Comments)    headache   Ramipril Other (See Comments)    Unknown reaction    Past Medical History, Surgical history, Social history, and Family History were reviewed and updated.  Review of Systems: All other 10 point review of systems is negative.   Physical Exam:  height is $RemoveB'5\' 10"'UursBRXz$  (1.778 m) and weight is 180 lb (81.6 kg). His oral temperature is 97.7 F (36.5 C). His blood pressure is 118/76 and his pulse is 60. His respiration is 18 and oxygen saturation is 100%.   Wt Readings from Last 3 Encounters:  05/08/22 180 lb (81.6 kg)  04/25/22 182 lb (82.6 kg)  04/10/22 181 lb (82.1 kg)    Ocular: Sclerae unicteric, pupils equal, round and reactive to light Ear-nose-throat: Oropharynx clear, dentition fair Lymphatic: No cervical or supraclavicular adenopathy Lungs no rales or rhonchi, good excursion bilaterally Heart regular rate and rhythm, no murmur appreciated Abd soft, nontender, positive bowel sounds MSK no focal spinal tenderness, no joint edema Neuro: non-focal, well-oriented, appropriate affect Breasts: Deferred   Lab Results  Component Value  Date   WBC 2.0 (L) 05/08/2022   HGB 12.5 (L) 05/08/2022   HCT 37.5 (L) 05/08/2022   MCV 96.4 05/08/2022   PLT 96 (L) 05/08/2022   Lab Results  Component Value Date   FERRITIN 2,286 (H) 12/20/2021   IRON 81 12/20/2021   TIBC 307 12/20/2021   UIBC 226 12/20/2021   IRONPCTSAT 26 12/20/2021   Lab Results  Component Value Date   RETICCTPCT 3.0 12/20/2021   RBC 3.89 (L) 05/08/2022   RETICCTABS 51.79 05/06/2011   Lab Results  Component Value Date   KPAFRELGTCHN 20.0 (H) 03/28/2022   LAMBDASER 9.5 03/28/2022   KAPLAMBRATIO 2.11 (H) 03/28/2022   Lab Results  Component Value Date   IGGSERUM 589 (L) 04/10/2022   IGA 52 (L) 04/10/2022   IGMSERUM 32 04/10/2022  Lab Results  Component Value Date   TOTALPROTELP 6.0 03/28/2022   ALBUMINELP 4.1 03/28/2022   A1GS 0.2 03/28/2022   A2GS 0.5 03/28/2022   BETS 0.9 03/28/2022   GAMS 0.4 03/28/2022   MSPIKE Not Observed 03/28/2022   SPEI Comment 07/10/2021     Chemistry      Component Value Date/Time   NA 139 05/08/2022 1007   NA 145 07/22/2017 0756   NA 139 06/27/2016 1410   K 4.2 05/08/2022 1007   K 4.3 07/22/2017 0756   K 5.1 06/27/2016 1410   CL 104 05/08/2022 1007   CL 105 07/22/2017 0756   CO2 26 05/08/2022 1007   CO2 30 07/22/2017 0756   CO2 28 06/27/2016 1410   BUN 27 (H) 05/08/2022 1007   BUN 17 07/22/2017 0756   BUN 20.9 06/27/2016 1410   CREATININE 1.28 (H) 05/08/2022 1007   CREATININE 1.6 (H) 07/22/2017 0756   CREATININE 1.6 (H) 06/27/2016 1410      Component Value Date/Time   CALCIUM 9.5 05/08/2022 1007   CALCIUM 9.6 07/22/2017 0756   CALCIUM 9.6 06/27/2016 1410   ALKPHOS 71 05/08/2022 1007   ALKPHOS 59 07/22/2017 0756   ALKPHOS 62 06/27/2016 1410   AST 20 05/08/2022 1007   AST 18 06/27/2016 1410   ALT 16 05/08/2022 1007   ALT 24 07/22/2017 0756   ALT 24 06/27/2016 1410   BILITOT 0.4 05/08/2022 1007   BILITOT 0.46 06/27/2016 1410       Impression and Plan: [name] is a pleasant 76 yo African  American gentleman with recurrent CLL and chronic neutropenia secondary to previous chemotherapy.  He continues to do well but with his low ANC and WBC count we will hold treatment for 2 weeks per MD.  He will receive his Ziextenzo today.  Follow-up in 2 weeks.   Lottie Dawson, NP 6/28/202310:59 AM

## 2022-05-08 NOTE — Patient Instructions (Signed)

## 2022-05-09 ENCOUNTER — Inpatient Hospital Stay: Payer: No Typology Code available for payment source

## 2022-05-09 LAB — IGG, IGA, IGM
IgA: 58 mg/dL — ABNORMAL LOW (ref 61–437)
IgG (Immunoglobin G), Serum: 592 mg/dL — ABNORMAL LOW (ref 603–1613)
IgM (Immunoglobulin M), Srm: 29 mg/dL (ref 15–143)

## 2022-05-10 ENCOUNTER — Ambulatory Visit: Payer: Medicare Other

## 2022-05-15 ENCOUNTER — Encounter: Payer: Self-pay | Admitting: Family

## 2022-05-15 ENCOUNTER — Ambulatory Visit (INDEPENDENT_AMBULATORY_CARE_PROVIDER_SITE_OTHER): Payer: No Typology Code available for payment source | Admitting: Family

## 2022-05-15 VITALS — BP 110/70 | HR 81 | Temp 98.0°F | Ht 70.0 in | Wt 180.1 lb

## 2022-05-15 DIAGNOSIS — S30860A Insect bite (nonvenomous) of lower back and pelvis, initial encounter: Secondary | ICD-10-CM | POA: Diagnosis not present

## 2022-05-15 DIAGNOSIS — W57XXXA Bitten or stung by nonvenomous insect and other nonvenomous arthropods, initial encounter: Secondary | ICD-10-CM

## 2022-05-15 NOTE — Progress Notes (Signed)
Subjective:     Patient ID: [name], male    DOB: 17-Nov-1945, 76 y.o.   MRN: [id number]  Chief Complaint  Patient presents with   Tick Removal    Pt states he has a tick bite on scrotum, Happened a week ago. Pt states it looks like a pimple, itchy.     HPI: Skin Lesion: Patient complains of a skin lesion of the trunk. The lesion has been present for 1 week. Lesion has changed in last week. Symptoms associated with the lesion are: increasing diameter, itching. Patient denies bleeding, pain, drainage.   Assessment & Plan:   Problem List Items Addressed This Visit   None Visit Diagnoses     Tick bite of pelvic region, initial encounter    -  Primary right scrotum, pimple like area, no rash or erythema or swelling. advised on cleaning with soap & water, can apply a thin layer of hydrocortisone cream to help with itching. Advised on s/s of infection, when to RO.      Outpatient Medications Prior to Visit  Medication Sig Dispense Refill   acyclovir (ZOVIRAX) 400 MG tablet TAKE 1 TABLET BY MOUTH EVERY DAY 90 tablet 1   Cholecalciferol (VITAMIN D3) 50 MCG (2000 UT) capsule Take 2,000 Units by mouth every morning.     [employer] MOTION SICKNESS RELIEF 25 MG CHEW CHEW 1 TABLET EVERY DAY AS NEEDED FOR DIZZINESS 32 tablet 2   docusate sodium (COLACE) 100 MG capsule Take 100 mg by mouth daily as needed (constipation).     ipratropium (ATROVENT) 0.03 % nasal spray Place 2 sprays into both nostrils every 12 (twelve) hours. (Patient taking differently: Place 2 sprays into both nostrils 2 (two) times daily as needed for rhinitis (congestion).) 30 mL 12   LORazepam (ATIVAN) 0.5 MG tablet Take 1 tablet (0.5 mg total) by mouth at bedtime as needed for anxiety (can take 1/4 or 1/2 tablet  if effective.). 30 tablet 2   omeprazole (PRILOSEC) 40 MG capsule Take 40 mg by mouth every evening.     ondansetron (ZOFRAN) 8 MG tablet Take 1 tablet (8 mg total) by mouth 2 (two) times daily as needed for  refractory nausea / vomiting. Start on day 2 after bendamustine chemo. 30 tablet 1   prochlorperazine (COMPAZINE) 10 MG tablet Take 1 tablet (10 mg total) by mouth every 6 (six) hours as needed (Nausea or vomiting). 30 tablet 1   rosuvastatin (CRESTOR) 5 MG tablet Take 5 mg by mouth daily.     sertraline (ZOLOFT) 100 MG tablet Take 100 mg by mouth every morning.     sodium chloride (OCEAN) 0.65 % SOLN nasal spray Place 1 spray into both nostrils as needed for congestion.     warfarin (COUMADIN) 5 MG tablet TAKE AS DIRECTED BY COUMADIN CLINIC 160 tablet 1   No facility-administered medications prior to visit.    Past Medical History:  Diagnosis Date   Anticoagulants causing adverse effect in therapeutic use    Anxiety    Paxil seemed to cause odd neuro side effects; better on prozac as of 09/2015   Aortic regurgitation 04/2007 surgery   Resolved with tissue AVR at [facility]   Cervical spondylosis    ESI by [name] in [place]. 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 (most recent 07/2017)   History of prostate cancer 2003   Rad prost  History of pulmonary embolism 2011; 10/2014   Recurrence off of anticoagulation 10/2014: per hem/onc ([name]) pt needs lifelong anticoagulation (hypercoag w/u neg).   Hyperlipidemia    statin-intolerant, except for crestor low dose.   Hypertension    Migraine syndrome    Mitral regurgitation    PAF (paroxysmal atrial fibrillation) (HCC)    Panic attacks    "           "             "                  "                  "                       "                             "                          Pulmonary nodule, right 2015   RLL: resected at [facility] --VATS; Benign RLL nodule (fungal and AFB stains neg).  Plan is for [facility] thoracic surg to do f/u o/v & CT chest 1 yr    Right-sided headache 07/2016   Primary stabbing HA or migraine per neuro (Dr. Tomi Likens).  Gabapentin and  topamax not tolerated.  These resolved spontaneously.    Past Surgical History:  Procedure Laterality Date   AORTIC VALVE REPLACEMENT  2011   [facility]  (bioprosthetic)   CARDIAC CATHETERIZATION  04/2010   Normal coronaries.   Carotid dopplers  08/2015   NORMAL   CATARACT EXTRACTION     L 12/06/09   R 09/14/09   COLONOSCOPY  01/26/13   tic's, o/w normal.  ([facility])--recall 10 yrs.   PENILE PROSTHESIS IMPLANT     PROSTATECTOMY  04/2002   for prostate cancer   Pulmonary nodule resection  Fall/winter 2016   [facility]   SHOULDER SURGERY     rotator cuff on both arms    TEE WITHOUT CARDIOVERSION N/A 07/13/2021   Procedure: TRANSESOPHAGEAL ECHOCARDIOGRAM (TEE);  Surgeon: Pixie Casino, MD;  Location: [facility] ENDOSCOPY;  Service: Cardiovascular;  Laterality: N/A;   TIBIALIS TENDON TRANSFER / REPAIR Right 03/02/2019   Procedure: RECONSTRUCTION OF RIGHT  ANTERIOR TIBIALIS TENDON;  Surgeon: Erle Crocker, MD;  Location: [facility];  Service: Orthopedics;  Laterality: Right;   TRANSTHORACIC ECHOCARDIOGRAM  09/20/2014; 03/2016; 03/2017   2015-mild LVH, EF 50-55%, wall motion nl, grade I diast dysfxn, prosth aort valve good,transaortic gradients decreased compared to echo 11/2013.   03/2016: EF 55-60%, normal LV function, severe LVH, normal diast fxn, mild increase in AV gradient compared to 09/2014 echo.  2018: EF 55-60%, normal LV fxn, grd II DD, AV stable/gradient's stable.    Allergies  Allergen Reactions   Hydrocodone-Acetaminophen Shortness Of Breath   Oxycodone Hcl Shortness Of Breath   Telmisartan-Hctz Shortness Of Breath, Palpitations and Other (See Comments)    Dizziness also   Aspirin Rash and Other (See Comments)    Confusion, also- Cannot take high doses   Atorvastatin Other (See Comments)    Myalgia    Buspirone Hcl Other (See Comments)    Headaches    Codeine Hives, Nausea And Vomiting and Other (See Comments)    Confusion  also   Morphine Sulfate Itching   Paroxetine  Other (See Comments)    Paresthesias: R arm, R leg, r side of face   Rabeprazole Other (See Comments)    headache   Ramipril Other (See Comments)    Unknown reaction       Objective:    Physical Exam Vitals and nursing note reviewed.  Constitutional:      General: He is not in acute distress.    Appearance: Normal appearance.  HENT:     Head: Normocephalic.  Cardiovascular:     Rate and Rhythm: Normal rate and regular rhythm.  Pulmonary:     Effort: Pulmonary effort is normal.     Breath sounds: Normal breath sounds.  Musculoskeletal:        General: Normal range of motion.     Cervical back: Normal range of motion.  Skin:    General: Skin is warm and dry.     Findings: Lesion (right scrotum, pimple-like, no erythema) present.  Neurological:     Mental Status: He is alert and oriented to person, place, and time.  Psychiatric:        Mood and Affect: Mood normal.    BP 110/70 (BP Location: Left Arm, Patient Position: Sitting, Cuff Size: Large)   Pulse 81   Temp 98 F (36.7 C) (Temporal)   Ht '5\' 10"'$  (1.778 m)   Wt 180 lb 2 oz (81.7 kg)   SpO2 99%   BMI 25.85 kg/m  Wt Readings from Last 3 Encounters:  05/15/22 180 lb 2 oz (81.7 kg)  05/08/22 180 lb (81.6 kg)  04/25/22 182 lb (82.6 kg)        Jeanie Sewer, NP

## 2022-05-15 NOTE — Patient Instructions (Signed)
It was very nice to see you today!    Clean your scrotum with soap and water, dry well, apply a thin layer of hydrocortisone cream to the area for next few days to help with itching.  Call the office if any worsening symptoms such as swelling, increased redness, or pain.     PLEASE NOTE:  If you had any lab tests please let us know if you have not heard back within a few days. You may see your results on MyChart before we have a chance to review them but we will give you a call once they are reviewed by [name]. If we ordered any referrals today, please let us know if you have not heard from their office within the next week.

## 2022-05-16 ENCOUNTER — Ambulatory Visit: Payer: Medicare Other | Admitting: Psychology

## 2022-05-17 ENCOUNTER — Ambulatory Visit (INDEPENDENT_AMBULATORY_CARE_PROVIDER_SITE_OTHER): Payer: No Typology Code available for payment source | Admitting: *Deleted

## 2022-05-17 DIAGNOSIS — Z5181 Encounter for therapeutic drug level monitoring: Secondary | ICD-10-CM

## 2022-05-17 DIAGNOSIS — I2699 Other pulmonary embolism without acute cor pulmonale: Secondary | ICD-10-CM

## 2022-05-17 DIAGNOSIS — Z952 Presence of prosthetic heart valve: Secondary | ICD-10-CM

## 2022-05-17 DIAGNOSIS — I4891 Unspecified atrial fibrillation: Secondary | ICD-10-CM

## 2022-05-17 LAB — POCT INR: INR: 2.6 (ref 2.0–3.0)

## 2022-05-17 NOTE — Patient Instructions (Signed)
Description   Continue taking warfarin 2 tablets daily except 3 tablets on Sundays and Thursdays. Recheck INR in 3 weeks. Coumadin Clinic [phone]

## 2022-05-20 ENCOUNTER — Other Ambulatory Visit: Payer: Self-pay | Admitting: Hematology & Oncology

## 2022-05-22 ENCOUNTER — Inpatient Hospital Stay: Payer: No Typology Code available for payment source | Attending: Hematology & Oncology

## 2022-05-22 ENCOUNTER — Inpatient Hospital Stay (HOSPITAL_BASED_OUTPATIENT_CLINIC_OR_DEPARTMENT_OTHER): Payer: No Typology Code available for payment source | Admitting: Family

## 2022-05-22 ENCOUNTER — Encounter: Payer: Self-pay | Admitting: Hematology & Oncology

## 2022-05-22 ENCOUNTER — Encounter: Payer: Self-pay | Admitting: Family

## 2022-05-22 ENCOUNTER — Inpatient Hospital Stay: Payer: No Typology Code available for payment source

## 2022-05-22 VITALS — BP 126/75 | HR 58 | Resp 18

## 2022-05-22 VITALS — BP 105/61 | HR 67 | Temp 97.6°F | Resp 18 | Wt 182.0 lb

## 2022-05-22 DIAGNOSIS — Z7901 Long term (current) use of anticoagulants: Secondary | ICD-10-CM | POA: Insufficient documentation

## 2022-05-22 DIAGNOSIS — R911 Solitary pulmonary nodule: Secondary | ICD-10-CM | POA: Insufficient documentation

## 2022-05-22 DIAGNOSIS — C9110 Chronic lymphocytic leukemia of B-cell type not having achieved remission: Secondary | ICD-10-CM

## 2022-05-22 DIAGNOSIS — Z86711 Personal history of pulmonary embolism: Secondary | ICD-10-CM | POA: Insufficient documentation

## 2022-05-22 DIAGNOSIS — T451X5A Adverse effect of antineoplastic and immunosuppressive drugs, initial encounter: Secondary | ICD-10-CM | POA: Insufficient documentation

## 2022-05-22 DIAGNOSIS — Z79899 Other long term (current) drug therapy: Secondary | ICD-10-CM | POA: Insufficient documentation

## 2022-05-22 DIAGNOSIS — D469 Myelodysplastic syndrome, unspecified: Secondary | ICD-10-CM | POA: Diagnosis not present

## 2022-05-22 DIAGNOSIS — D509 Iron deficiency anemia, unspecified: Secondary | ICD-10-CM

## 2022-05-22 DIAGNOSIS — D709 Neutropenia, unspecified: Secondary | ICD-10-CM

## 2022-05-22 DIAGNOSIS — D701 Agranulocytosis secondary to cancer chemotherapy: Secondary | ICD-10-CM | POA: Insufficient documentation

## 2022-05-22 DIAGNOSIS — D631 Anemia in chronic kidney disease: Secondary | ICD-10-CM

## 2022-05-22 LAB — CMP (CANCER CENTER ONLY)
ALT: 18 U/L (ref 0–44)
AST: 23 U/L (ref 15–41)
Albumin: 4.4 g/dL (ref 3.5–5.0)
Alkaline Phosphatase: 82 U/L (ref 38–126)
Anion gap: 7 (ref 5–15)
BUN: 32 mg/dL — ABNORMAL HIGH (ref 8–23)
CO2: 28 mmol/L (ref 22–32)
Calcium: 9.3 mg/dL (ref 8.9–10.3)
Chloride: 104 mmol/L (ref 98–111)
Creatinine: 1.53 mg/dL — ABNORMAL HIGH (ref 0.61–1.24)
GFR, Estimated: 47 mL/min — ABNORMAL LOW (ref >60)
Glucose, Bld: 78 mg/dL (ref 70–99)
Potassium: 4.4 mmol/L (ref 3.5–5.1)
Sodium: 139 mmol/L (ref 135–145)
Total Bilirubin: 0.5 mg/dL (ref 0.3–1.2)
Total Protein: 6.1 g/dL — ABNORMAL LOW (ref 6.5–8.1)

## 2022-05-22 LAB — CBC WITH DIFFERENTIAL (CANCER CENTER ONLY)
Abs Immature Granulocytes: 0 10*3/uL (ref 0.00–0.07)
Basophils Absolute: 0 10*3/uL (ref 0.0–0.1)
Basophils Relative: 1 %
Eosinophils Absolute: 0 10*3/uL (ref 0.0–0.5)
Eosinophils Relative: 3 %
HCT: 34.5 % — ABNORMAL LOW (ref 39.0–52.0)
Hemoglobin: 11.1 g/dL — ABNORMAL LOW (ref 13.0–17.0)
Immature Granulocytes: 0 %
Lymphocytes Relative: 52 %
Lymphs Abs: 0.8 10*3/uL (ref 0.7–4.0)
MCH: 31.9 pg (ref 26.0–34.0)
MCHC: 32.2 g/dL (ref 30.0–36.0)
MCV: 99.1 fL (ref 80.0–100.0)
Monocytes Absolute: 0.2 10*3/uL (ref 0.1–1.0)
Monocytes Relative: 14 %
Neutro Abs: 0.5 10*3/uL — ABNORMAL LOW (ref 1.7–7.7)
Neutrophils Relative %: 30 %
Platelet Count: 88 10*3/uL — ABNORMAL LOW (ref 150–400)
RBC: 3.48 MIL/uL — ABNORMAL LOW (ref 4.22–5.81)
RDW: 14 % (ref 11.5–15.5)
WBC Count: 1.5 10*3/uL — ABNORMAL LOW (ref 4.0–10.5)
nRBC: 0 % (ref 0.0–0.2)

## 2022-05-22 LAB — LACTATE DEHYDROGENASE: LDH: 326 U/L — ABNORMAL HIGH (ref 98–192)

## 2022-05-22 MED ORDER — PEGFILGRASTIM INJECTION 6 MG/0.6ML ~~LOC~~
6.0000 mg | PREFILLED_SYRINGE | Freq: Once | SUBCUTANEOUS | Status: AC
  Administered 2022-05-22: 6 mg via SUBCUTANEOUS
  Filled 2022-05-22: qty 0.6

## 2022-05-22 NOTE — Progress Notes (Signed)
Okay for Neulasta today per Ross Ludwig, patient advocate.

## 2022-05-22 NOTE — Progress Notes (Signed)
Hematology and Oncology Follow Up Visit  [name] [id number] 12/17/1945 76 y.o. 05/22/2022   Principle Diagnosis:  Right lower lobe pulmonary nodule- non-malignant Recurrent pulmonary embolism CLL -- progressive -- 11q-/13q- Chronic neutropenia secondary to chemotherapy/CLL   Past Therapy: Calquence 100 mg po BID -- start on 03/13/2020 -- d/c on 07/05/2021 Venetoclax 100 mg po q day -- change 07/05/2021 -- d/c on 07/05/2021   Current Therapy:   Rituxan/Bendamustine -- started on 01/03/2022, s/p cycle 3 -- Rituxan DC'd due to leukopenia on 03/28/2022 Coumadin 5 mg po q day Ziextenzo 6 mg SQ as indicated for low WBC count/ANC    Interim History:  [name] is here today for follow-up and treatment. His WBC count is 1.5 and ANC 0.5.  He notes some fatigue in the morning but otherwise has no complaints.  His is staying active with Tai Chi, swimming and walking.  No issue with infection. No fever, chills, n/v, cough, rash, dizziness, SOB, chest pain, palpitations, abdominal pain or changes in bowel or bladder habits.  No swelling, tenderness in his extremities.  Neuropathy in the upper and lower extremities unchanged from baseline.  No falls or syncope reported. He does have occasional night sweats.  Appetite and hydration have been good. His weight is stable at 182 lbs.  No bleeding, bruising or petechiae.   ECOG Performance Status: 1 - Symptomatic but completely ambulatory  Medications:  Allergies as of 05/22/2022       Reactions   Hydrocodone-acetaminophen Shortness Of Breath   Oxycodone Hcl Shortness Of Breath   Telmisartan-hctz Shortness Of Breath, Palpitations, Other (See Comments)   Dizziness also   Aspirin Rash, Other (See Comments)   Confusion, also- Cannot take high doses   Atorvastatin Other (See Comments)   Myalgia   Buspirone Hcl Other (See Comments)   Headaches   Codeine Hives, Nausea And Vomiting, Other (See Comments)   Confusion also   Morphine  Sulfate Itching   Paroxetine Other (See Comments)   Paresthesias: R arm, R leg, r side of face   Rabeprazole Other (See Comments)   headache   Ramipril Other (See Comments)   Unknown reaction        Medication List        Accurate as of May 22, 2022  9:31 AM. If you have any questions, ask your nurse or doctor.          acyclovir 400 MG tablet Commonly known as: ZOVIRAX TAKE 1 TABLET BY MOUTH EVERY DAY   [employer] Motion Sickness Relief 25 MG Chew Generic drug: Meclizine HCl CHEW 1 TABLET EVERY DAY AS NEEDED FOR DIZZINESS   docusate sodium 100 MG capsule Commonly known as: COLACE Take 100 mg by mouth daily as needed (constipation).   ipratropium 0.03 % nasal spray Commonly known as: ATROVENT Place 2 sprays into both nostrils every 12 (twelve) hours. What changed:  when to take this reasons to take this   LORazepam 0.5 MG tablet Commonly known as: ATIVAN Take 1 tablet (0.5 mg total) by mouth at bedtime as needed for anxiety (can take 1/4 or 1/2 tablet  if effective.).   omeprazole 40 MG capsule Commonly known as: PRILOSEC Take 40 mg by mouth every evening.   ondansetron 8 MG tablet Commonly known as: Zofran Take 1 tablet (8 mg total) by mouth 2 (two) times daily as needed for refractory nausea / vomiting. Start on day 2 after bendamustine chemo.   prochlorperazine 10 MG tablet Commonly known as: COMPAZINE   Take 1 tablet (10 mg total) by mouth every 6 (six) hours as needed (Nausea or vomiting).   rosuvastatin 5 MG tablet Commonly known as: CRESTOR Take 5 mg by mouth daily.   sertraline 100 MG tablet Commonly known as: ZOLOFT Take 100 mg by mouth every morning.   sodium chloride 0.65 % Soln nasal spray Commonly known as: OCEAN Place 1 spray into both nostrils as needed for congestion.   Vitamin D3 50 MCG (2000 UT) capsule Take 2,000 Units by mouth every morning.   warfarin 5 MG tablet Commonly known as: COUMADIN Take as directed by the anticoagulation  clinic. If you are unsure how to take this medication, talk to your nurse or doctor. Original instructions: TAKE AS DIRECTED BY COUMADIN CLINIC        Allergies:  Allergies  Allergen Reactions   Hydrocodone-Acetaminophen Shortness Of Breath   Oxycodone Hcl Shortness Of Breath   Telmisartan-Hctz Shortness Of Breath, Palpitations and Other (See Comments)    Dizziness also   Aspirin Rash and Other (See Comments)    Confusion, also- Cannot take high doses   Atorvastatin Other (See Comments)    Myalgia    Buspirone Hcl Other (See Comments)    Headaches    Codeine Hives, Nausea And Vomiting and Other (See Comments)    Confusion also   Morphine Sulfate Itching   Paroxetine Other (See Comments)    Paresthesias: R arm, R leg, r side of face   Rabeprazole Other (See Comments)    headache   Ramipril Other (See Comments)    Unknown reaction    Past Medical History, Surgical history, Social history, and Family History were reviewed and updated.  Review of Systems: All other 10 point review of systems is negative.   Physical Exam:  weight is 182 lb (82.6 kg). His oral temperature is 97.6 F (36.4 C). His blood pressure is 105/61 and his pulse is 67. His respiration is 18 and oxygen saturation is 100%.   Wt Readings from Last 3 Encounters:  05/22/22 182 lb (82.6 kg)  05/15/22 180 lb 2 oz (81.7 kg)  05/08/22 180 lb (81.6 kg)    Ocular: Sclerae unicteric, pupils equal, round and reactive to light Ear-nose-throat: Oropharynx clear, dentition fair Lymphatic: No cervical or supraclavicular adenopathy Lungs no rales or rhonchi, good excursion bilaterally Heart regular rate and rhythm, no murmur appreciated Abd soft, nontender, positive bowel sounds MSK no focal spinal tenderness, no joint edema Neuro: non-focal, well-oriented, appropriate affect Breasts: Deferred   Lab Results  Component Value Date   WBC 1.5 (L) 05/22/2022   HGB 11.1 (L) 05/22/2022   HCT 34.5 (L) 05/22/2022    MCV 99.1 05/22/2022   PLT 88 (L) 05/22/2022   Lab Results  Component Value Date   FERRITIN 2,286 (H) 12/20/2021   IRON 81 12/20/2021   TIBC 307 12/20/2021   UIBC 226 12/20/2021   IRONPCTSAT 26 12/20/2021   Lab Results  Component Value Date   RETICCTPCT 3.0 12/20/2021   RBC 3.48 (L) 05/22/2022   RETICCTABS 51.79 05/06/2011   Lab Results  Component Value Date   KPAFRELGTCHN 20.0 (H) 03/28/2022   LAMBDASER 9.5 03/28/2022   KAPLAMBRATIO 2.11 (H) 03/28/2022   Lab Results  Component Value Date   IGGSERUM 592 (L) 05/08/2022   IGA 58 (L) 05/08/2022   IGMSERUM 29 05/08/2022   Lab Results  Component Value Date   TOTALPROTELP 6.0 03/28/2022   ALBUMINELP 4.1 03/28/2022   A1GS 0.2 03/28/2022  A2GS 0.5 03/28/2022   BETS 0.9 03/28/2022   GAMS 0.4 03/28/2022   MSPIKE Not Observed 03/28/2022   SPEI Comment 07/10/2021     Chemistry      Component Value Date/Time   NA 139 05/08/2022 1007   NA 145 07/22/2017 0756   NA 139 06/27/2016 1410   K 4.2 05/08/2022 1007   K 4.3 07/22/2017 0756   K 5.1 06/27/2016 1410   CL 104 05/08/2022 1007   CL 105 07/22/2017 0756   CO2 26 05/08/2022 1007   CO2 30 07/22/2017 0756   CO2 28 06/27/2016 1410   BUN 27 (H) 05/08/2022 1007   BUN 17 07/22/2017 0756   BUN 20.9 06/27/2016 1410   CREATININE 1.28 (H) 05/08/2022 1007   CREATININE 1.6 (H) 07/22/2017 0756   CREATININE 1.6 (H) 06/27/2016 1410      Component Value Date/Time   CALCIUM 9.5 05/08/2022 1007   CALCIUM 9.6 07/22/2017 0756   CALCIUM 9.6 06/27/2016 1410   ALKPHOS 71 05/08/2022 1007   ALKPHOS 59 07/22/2017 0756   ALKPHOS 62 06/27/2016 1410   AST 20 05/08/2022 1007   AST 18 06/27/2016 1410   ALT 16 05/08/2022 1007   ALT 24 07/22/2017 0756   ALT 24 06/27/2016 1410   BILITOT 0.4 05/08/2022 1007   BILITOT 0.46 06/27/2016 1410       Impression and Plan: [name] is a pleasant 76 yo African American gentleman with recurrent CLL and chronic neutropenia secondary to previous  chemotherapy.  We will hold main treatment again today per MD due to low WBC count and NAC. Patient will change him over to Neulasta today and see if this works better for him. He has not seemed to respond to the [place].  Follow-up in 2 weeks.   Lottie Dawson, NP 7/12/20239:31 AM

## 2022-05-22 NOTE — Patient Instructions (Signed)

## 2022-05-23 ENCOUNTER — Inpatient Hospital Stay: Payer: No Typology Code available for payment source

## 2022-05-23 LAB — IGG, IGA, IGM
IgA: 53 mg/dL — ABNORMAL LOW (ref 61–437)
IgG (Immunoglobin G), Serum: 528 mg/dL — ABNORMAL LOW (ref 603–1613)
IgM (Immunoglobulin M), Srm: 26 mg/dL (ref 15–143)

## 2022-05-24 ENCOUNTER — Inpatient Hospital Stay: Payer: No Typology Code available for payment source

## 2022-05-27 ENCOUNTER — Ambulatory Visit (INDEPENDENT_AMBULATORY_CARE_PROVIDER_SITE_OTHER): Payer: Medicare Other | Admitting: Psychology

## 2022-05-27 DIAGNOSIS — F4323 Adjustment disorder with mixed anxiety and depressed mood: Secondary | ICD-10-CM

## 2022-05-27 NOTE — Progress Notes (Signed)
Date: 05/27/2022  Treatment Plan: Diagnosis F99 (Unspecified mental disorder) [n/a]  309.28 (Adjustment disorder with mixed anxiety and depressed mood) [n/a]  Symptoms Depressed or irritable mood. (Status: maintained) -- No Description Entered  Diminished interest in or enjoyment of activities. (Status: maintained) -- No Description Entered  Experiences disturbing and persistent thoughts, images, and/or perceptions of the traumatic event. (Status: maintained) -- No Description Entered  Has been exposed to a traumatic event involving actual or perceived threat of death or serious injury. (Status: maintained) -- No Description Entered  Intentionally avoids thoughts, feelings, or discussions related to the traumatic event. (Status: maintained) -- No Description Entered  Lack of energy. (Status: maintained) -- No Description Entered  Sad affect, social withdrawal, anxiety, loss of interest in activities, and low energy. (Status: maintained) -- No Description Entered  Symptoms present more than one month. (Status: maintained) -- No Description Entered  Medication Status compliance  Safety none  If Suicidal or Homicidal State Action Taken: unspecified  Current Risk: low Medications Lorazepam (Dosage: .39m)  Meclizine (Dosage: unknown)  Sertrline (Dosage: 1065m  Xarelto (Dosage: unknown)  Objectives Related Problem: Develop healthy thinking patterns and beliefs about self, others, and the world that lead to the alleviation and help prevent the relapse of depression. Description: Implement mindfulness techniques for relapse prevention. Target Date: 2022-11-07 Frequency: Daily Modality: individual Progress: 70%  Related Problem: Develop healthy thinking patterns and beliefs about self, others, and the world that lead to the alleviation and help prevent the relapse of depression. Description: Learn and implement conflict resolution skills to resolve interpersonal  problems. Target Date: 2022-07-08 Frequency: Daily Modality: individual Progress: 100%-completed  Related Problem: Develop healthy thinking patterns and beliefs about self, others, and the world that lead to the alleviation and help prevent the relapse of depression. Description: Learn and implement behavioral strategies to overcome depression. Target Date: 2022-11-07 Frequency: Daily Modality: individual Progress: 70%  Related Problem: Develop healthy thinking patterns and beliefs about self, others, and the world that lead to the alleviation and help prevent the relapse of depression. Description: Describe current and past experiences with depression including their impact on functioning and attempts to resolve it. Target Date: 2022-11-07 Frequency: Daily Modality: individual Progress: 70%  Client Response full compliance  Service Location Location, [address] [place] [id number] Code cpt [id number] care activities  Emotion regulation skills  Distress tolerance skill  Validate/empathize  Identified an insight  Facilitate problem solving  Normalize/Reframe  Comments  Dx: F43.21  [name] agreed to having a video session due to the pandemic. He was at home and I was in my home office.  Meds: Sertraline (10070m Vit. D, Meclizine, blood pressure med, cholesterol med, Lorazepam (.5 mg as needed).  Goals: Seeking relief to depressive feelings of sadness and lack of motivation/interest in activities. Also, has some anxieties related to health and his own mortality. Goal Date: 11-10-22   SamInocente Sallesys he is getting tired and has both physical and emotional fatigue. His white blood count has been low lately and the doctors are changing up his medication. He continues to be involved in [employer] finds himself being more open than ever before. [name] says [place] reluctant to make plans for fear of having to cancel. [place] taught him to listen to his body.  [name], PhD  Time: 3:15a-4:00 45 minutes

## 2022-05-28 NOTE — Progress Notes (Unsigned)
Ziextenzo changed to Neulasta per patient's insurance formulary.

## 2022-05-31 ENCOUNTER — Inpatient Hospital Stay (HOSPITAL_BASED_OUTPATIENT_CLINIC_OR_DEPARTMENT_OTHER): Payer: No Typology Code available for payment source | Admitting: Hematology & Oncology

## 2022-05-31 ENCOUNTER — Other Ambulatory Visit: Payer: Self-pay | Admitting: *Deleted

## 2022-05-31 ENCOUNTER — Encounter: Payer: Self-pay | Admitting: Hematology & Oncology

## 2022-05-31 ENCOUNTER — Ambulatory Visit (HOSPITAL_BASED_OUTPATIENT_CLINIC_OR_DEPARTMENT_OTHER)
Admission: RE | Admit: 2022-05-31 | Discharge: 2022-05-31 | Disposition: A | Discharge status: Home / Self Care | Payer: Medicare Other | Source: Ambulatory Visit | Attending: Hematology & Oncology | Admitting: Hematology & Oncology

## 2022-05-31 ENCOUNTER — Inpatient Hospital Stay: Payer: No Typology Code available for payment source

## 2022-05-31 ENCOUNTER — Other Ambulatory Visit: Payer: Self-pay

## 2022-05-31 VITALS — BP 112/66 | HR 67 | Temp 97.9°F | Resp 18 | Ht 70.0 in | Wt 183.1 lb

## 2022-05-31 DIAGNOSIS — D631 Anemia in chronic kidney disease: Secondary | ICD-10-CM

## 2022-05-31 DIAGNOSIS — C9110 Chronic lymphocytic leukemia of B-cell type not having achieved remission: Secondary | ICD-10-CM

## 2022-05-31 DIAGNOSIS — R5081 Fever presenting with conditions classified elsewhere: Secondary | ICD-10-CM | POA: Diagnosis present

## 2022-05-31 DIAGNOSIS — D509 Iron deficiency anemia, unspecified: Secondary | ICD-10-CM

## 2022-05-31 DIAGNOSIS — D709 Neutropenia, unspecified: Secondary | ICD-10-CM | POA: Diagnosis present

## 2022-05-31 DIAGNOSIS — D701 Agranulocytosis secondary to cancer chemotherapy: Secondary | ICD-10-CM | POA: Diagnosis not present

## 2022-05-31 LAB — CBC WITH DIFFERENTIAL (CANCER CENTER ONLY)
Abs Immature Granulocytes: 0.03 10*3/uL (ref 0.00–0.07)
Basophils Absolute: 0 10*3/uL (ref 0.0–0.1)
Basophils Relative: 1 %
Eosinophils Absolute: 0 10*3/uL (ref 0.0–0.5)
Eosinophils Relative: 0 %
HCT: 34.7 % — ABNORMAL LOW (ref 39.0–52.0)
Hemoglobin: 11.3 g/dL — ABNORMAL LOW (ref 13.0–17.0)
Immature Granulocytes: 1 %
Lymphocytes Relative: 16 %
Lymphs Abs: 0.6 10*3/uL — ABNORMAL LOW (ref 0.7–4.0)
MCH: 31.7 pg (ref 26.0–34.0)
MCHC: 32.6 g/dL (ref 30.0–36.0)
MCV: 97.2 fL (ref 80.0–100.0)
Monocytes Absolute: 0.5 10*3/uL (ref 0.1–1.0)
Monocytes Relative: 13 %
Neutro Abs: 2.6 10*3/uL (ref 1.7–7.7)
Neutrophils Relative %: 69 %
Platelet Count: 97 10*3/uL — ABNORMAL LOW (ref 150–400)
RBC: 3.57 MIL/uL — ABNORMAL LOW (ref 4.22–5.81)
RDW: 13.9 % (ref 11.5–15.5)
WBC Count: 3.7 10*3/uL — ABNORMAL LOW (ref 4.0–10.5)
nRBC: 0 % (ref 0.0–0.2)

## 2022-05-31 MED ORDER — CEFDINIR 300 MG PO CAPS: 600.0000 mg | ORAL_CAPSULE | Freq: Every day | ORAL | 0 refills | Status: DC

## 2022-05-31 NOTE — Progress Notes (Signed)
Hematology and Oncology Follow Up Visit  Delton Stelle [place] [id number] November 27, 1945 76 y.o. 05/31/2022   Principle Diagnosis:  Right lower lobe pulmonary nodule- non-malignant Recurrent pulmonary embolism CLL -- progressive -- 11q-/13q- Chronic neutropenia secondary to chemotherapy/CLL   Past Therapy: Calquence 100 mg po BID -- start on 03/13/2020 -- d/c on 07/05/2021 Venetoclax 100 mg po q day -- change 07/05/2021 -- d/c on 07/05/2021   Current Therapy:   Rituxan/Bendamustine -- started on 01/03/2022, s/p cycle 3 -- Rituxan DC'd due to leukopenia on 03/28/2022 Coumadin 5 mg po q day Ziextenzo 6 mg SQ as indicated for low WBC count/ANC    Interim History:  Mr. Seider is here today for an unscheduled visit.  Having some temperatures.  He said that he began to feel a little bit bad yesterday.  He just felt very tired.  He had some chills.  This today, he said it is temperature was 101.2.  He called the office.  We got him in.  We got a chest x-ray which was negative for any infiltrate.  We did get labs on him.  This did show that he was not neutropenic.  His white cell count 3.7.  He did mention that he was at a funeral this past weekend.  He said there was COVID exposure.  He has not tested for COVID.  We really need to get him to do this.  He says he will do this.  He says he is having sinus drainage.  He says this is somewhat purulent.  This might be the source of fever.  We will go ahead and get him on some antibiotic.  I think that he would benefit from Prentiss (600 mg p.o. daily x7 days).  He has had no mouth sores.  He has had no swollen lymph nodes.  He has had no obvious change in bowel or bladder habits.  Henrene Pastor does have a mechanical aortic valve.  With the fever, he probably needs to have this checked.  We will see about doing an echocardiogram.  Currently, I would have said that his performance status is probably ECOG    Medications:  Allergies as of 05/31/2022        Reactions   Hydrocodone-acetaminophen Shortness Of Breath   Oxycodone Hcl Shortness Of Breath   Telmisartan-hctz Shortness Of Breath, Palpitations, Other (See Comments)   Dizziness also   Aspirin Rash, Other (See Comments)   Confusion, also- Cannot take high doses   Atorvastatin Other (See Comments)   Myalgia   Buspirone Hcl Other (See Comments)   Headaches   Codeine Hives, Nausea And Vomiting, Other (See Comments)   Confusion also   Morphine Sulfate Itching   Paroxetine Other (See Comments)   Paresthesias: R arm, R leg, r side of face   Rabeprazole Other (See Comments)   headache   Ramipril Other (See Comments)   Unknown reaction        Medication List        Accurate as of May 31, 2022  3:00 PM. If you have any questions, ask your nurse or doctor.          acyclovir 400 MG tablet Commonly known as: ZOVIRAX TAKE 1 TABLET BY MOUTH EVERY DAY   cefdinir 300 MG capsule Commonly known as: OMNICEF Take 2 capsules (600 mg total) by mouth daily. Started by: Volanda Napoleon, MD   [facility] S[facility] Relief 25 MG Chew Generic drug: Meclizine HCl CHEW 1 TABLET EVERY DAY AS  NEEDED FOR DIZZINESS   docusate sodium 100 MG capsule Commonly known as: COLACE Take 100 mg by mouth daily as needed (constipation).   ipratropium 0.03 % nasal spray Commonly known as: ATROVENT Place 2 sprays into both nostrils every 12 (twelve) hours. What changed:  when to take this reasons to take this   LORazepam 0.5 MG tablet Commonly known as: ATIVAN Take 1 tablet (0.5 mg total) by mouth at bedtime as needed for anxiety (can take 1/4 or 1/2 tablet  if effective.).   omeprazole 40 MG capsule Commonly known as: PRILOSEC Take 40 mg by mouth every evening.   ondansetron 8 MG tablet Commonly known as: Zofran Take 1 tablet (8 mg total) by mouth 2 (two) times daily as needed for refractory nausea / vomiting. Start on day 2 after bendamustine chemo.   prochlorperazine 10 MG  tablet Commonly known as: COMPAZINE Take 1 tablet (10 mg total) by mouth every 6 (six) hours as needed (Nausea or vomiting).   rosuvastatin 5 MG tablet Commonly known as: CRESTOR Take 5 mg by mouth daily.   sertraline 100 MG tablet Commonly known as: ZOLOFT Take 100 mg by mouth every morning.   sodium chloride 0.65 % Soln nasal spray Commonly known as: OCEAN Place 1 spray into both nostrils as needed for congestion.   Vitamin D3 50 MCG (2000 UT) capsule Take 2,000 Units by mouth every morning.   warfarin 5 MG tablet Commonly known as: COUMADIN Take as directed by the anticoagulation clinic. If you are unsure how to take this medication, talk to your nurse or doctor. Original instructions: TAKE AS DIRECTED BY COUMADIN CLINIC        Allergies:  Allergies  Allergen Reactions   Hydrocodone-Acetaminophen Shortness Of Breath   Oxycodone Hcl Shortness Of Breath   Telmisartan-Hctz Shortness Of Breath, Palpitations and Other (See Comments)    Dizziness also   Aspirin Rash and Other (See Comments)    Confusion, also- Cannot take high doses   Atorvastatin Other (See Comments)    Myalgia    Buspirone Hcl Other (See Comments)    Headaches    Codeine Hives, Nausea And Vomiting and Other (See Comments)    Confusion also   Morphine Sulfate Itching   Paroxetine Other (See Comments)    Paresthesias: R arm, R leg, r side of face   Rabeprazole Other (See Comments)    headache   Ramipril Other (See Comments)    Unknown reaction    Past Medical History, Surgical history, Social history, and Family History were reviewed and updated.  Review of Systems: Review of Systems  Constitutional:  Positive for chills and fever.  HENT:  Positive for sinus pain.   Eyes: Negative.   Respiratory: Negative.    Cardiovascular: Negative.   Gastrointestinal: Negative.   Genitourinary: Negative.   Musculoskeletal: Negative.   Skin: Negative.   Neurological: Negative.   Endo/Heme/Allergies:  Negative.   Psychiatric/Behavioral: Negative.       Physical Exam:  height is '5\' 10"'$  (1.778 m) and weight is 183 lb 1.6 oz (83.1 kg). His oral temperature is 97.9 F (36.6 C). His blood pressure is 112/66 and his pulse is 67. His respiration is 18 and oxygen saturation is 100%.   Wt Readings from Last 3 Encounters:  05/31/22 183 lb 1.6 oz (83.1 kg)  05/22/22 182 lb (82.6 kg)  05/15/22 180 lb 2 oz (81.7 kg)    Physical Exam Vitals reviewed.  HENT:     Head: Normocephalic  and atraumatic.  Eyes:     Pupils: Pupils are equal, round, and reactive to light.  Cardiovascular:     Rate and Rhythm: Normal rate and regular rhythm.     Heart sounds: Normal heart sounds.     Comments: Cardiac exam shows a regular rate and rhythm with normal S1 and S2.  Has a 2 or 6 systolic ejection murmur.  He has had this in the past. Pulmonary:     Effort: Pulmonary effort is normal.     Breath sounds: Normal breath sounds.  Abdominal:     General: Bowel sounds are normal.     Palpations: Abdomen is soft.  Musculoskeletal:        General: No tenderness or deformity. Normal range of motion.     Cervical back: Normal range of motion.  Lymphadenopathy:     Cervical: No cervical adenopathy.  Skin:    General: Skin is warm and dry.     Findings: No erythema or rash.  Neurological:     Mental Status: He is alert and oriented to person, place, and time.  Psychiatric:        Behavior: Behavior normal.        Thought Content: Thought content normal.        Judgment: Judgment normal.      Lab Results  Component Value Date   WBC 3.7 (L) 05/31/2022   HGB 11.3 (L) 05/31/2022   HCT 34.7 (L) 05/31/2022   MCV 97.2 05/31/2022   PLT 97 (L) 05/31/2022   Lab Results  Component Value Date   FERRITIN 2,286 (H) 12/20/2021   IRON 81 12/20/2021   TIBC 307 12/20/2021   UIBC 226 12/20/2021   IRONPCTSAT 26 12/20/2021   Lab Results  Component Value Date   RETICCTPCT 3.0 12/20/2021   RBC 3.57 (L)  05/31/2022   RETICCTABS 51.79 05/06/2011   Lab Results  Component Value Date   KPAFRELGTCHN 20.0 (H) 03/28/2022   LAMBDASER 9.5 03/28/2022   KAPLAMBRATIO 2.11 (H) 03/28/2022   Lab Results  Component Value Date   IGGSERUM 528 (L) 05/22/2022   IGA 53 (L) 05/22/2022   IGMSERUM 26 05/22/2022   Lab Results  Component Value Date   TOTALPROTELP 6.0 03/28/2022   ALBUMINELP 4.1 03/28/2022   A1GS 0.2 03/28/2022   A2GS 0.5 03/28/2022   BETS 0.9 03/28/2022   GAMS 0.4 03/28/2022   MSPIKE Not Observed 03/28/2022   SPEI Comment 07/10/2021     Chemistry      Component Value Date/Time   NA 139 05/22/2022 0901   NA 145 07/22/2017 0756   NA 139 06/27/2016 1410   K 4.4 05/22/2022 0901   K 4.3 07/22/2017 0756   K 5.1 06/27/2016 1410   CL 104 05/22/2022 0901   CL 105 07/22/2017 0756   CO2 28 05/22/2022 0901   CO2 30 07/22/2017 0756   CO2 28 06/27/2016 1410   BUN 32 (H) 05/22/2022 0901   BUN 17 07/22/2017 0756   BUN 20.9 06/27/2016 1410   CREATININE 1.53 (H) 05/22/2022 0901   CREATININE 1.6 (H) 07/22/2017 0756   CREATININE 1.6 (H) 06/27/2016 1410      Component Value Date/Time   CALCIUM 9.3 05/22/2022 0901   CALCIUM 9.6 07/22/2017 0756   CALCIUM 9.6 06/27/2016 1410   ALKPHOS 82 05/22/2022 0901   ALKPHOS 59 07/22/2017 0756   ALKPHOS 62 06/27/2016 1410   AST 23 05/22/2022 0901   AST 18 06/27/2016 1410   ALT 18  05/22/2022 0901   ALT 24 07/22/2017 0756   ALT 24 06/27/2016 1410   BILITOT 0.5 05/22/2022 0901   BILITOT 0.46 06/27/2016 1410       Impression and Plan: Mr. Pettengill is a pleasant 76 yo African American gentleman with recurrent CLL and chronic neutropenia secondary to previous chemotherapy.   Again, thankfully he is not neutropenic.  This is certainly helpful.  I am not sure exactly where the fever could be coming from.  It could be coming from the sinuses.  It could also be coming from [place].  Not sure that he has probably had a little bit of a higher risk for getting  COVID.  Again I told him that he really needs to check for COVID.  I do think that he would benefit from an echocardiogram.  He has had a fever.  He has had a mechanical aortic valve.  I know that he would be at risk for endocarditis.  Hopefully, this is not the case.  He did look pretty good.  Hopefully this is a self-limited infection.  He is post to see [name] next Wednesday.  We may try to move his appointment out a little bit longer.  Volanda Napoleon, MD 7/21/20233:00 PM

## 2022-06-02 ENCOUNTER — Encounter: Payer: Self-pay | Admitting: Hematology & Oncology

## 2022-06-03 ENCOUNTER — Other Ambulatory Visit: Payer: Self-pay

## 2022-06-05 ENCOUNTER — Inpatient Hospital Stay: Payer: Medicare Other

## 2022-06-05 ENCOUNTER — Inpatient Hospital Stay: Payer: No Typology Code available for payment source | Admitting: Hematology & Oncology

## 2022-06-05 ENCOUNTER — Ambulatory Visit: Payer: Medicare Other

## 2022-06-11 ENCOUNTER — Ambulatory Visit: Payer: Medicare Other | Admitting: Family Medicine

## 2022-06-11 ENCOUNTER — Other Ambulatory Visit: Payer: Self-pay

## 2022-06-13 ENCOUNTER — Inpatient Hospital Stay (HOSPITAL_COMMUNITY)
Admission: EM | Admit: 2022-06-13 | Discharge: 2022-06-20 | DRG: 841 | Disposition: A | Discharge status: Skilled Nursing Facility | Payer: No Typology Code available for payment source | Attending: Family Medicine | Admitting: Family Medicine

## 2022-06-13 ENCOUNTER — Other Ambulatory Visit: Payer: Self-pay

## 2022-06-13 ENCOUNTER — Encounter (HOSPITAL_COMMUNITY): Payer: Self-pay

## 2022-06-13 ENCOUNTER — Emergency Department (HOSPITAL_COMMUNITY): Payer: No Typology Code available for payment source

## 2022-06-13 ENCOUNTER — Ambulatory Visit (INDEPENDENT_AMBULATORY_CARE_PROVIDER_SITE_OTHER): Payer: Medicare Other | Admitting: Psychology

## 2022-06-13 DIAGNOSIS — R32 Unspecified urinary incontinence: Secondary | ICD-10-CM | POA: Diagnosis present

## 2022-06-13 DIAGNOSIS — E785 Hyperlipidemia, unspecified: Secondary | ICD-10-CM | POA: Diagnosis present

## 2022-06-13 DIAGNOSIS — K769 Liver disease, unspecified: Secondary | ICD-10-CM | POA: Diagnosis present

## 2022-06-13 DIAGNOSIS — Z9079 Acquired absence of other genital organ(s): Secondary | ICD-10-CM

## 2022-06-13 DIAGNOSIS — R4182 Altered mental status, unspecified: Secondary | ICD-10-CM | POA: Diagnosis present

## 2022-06-13 DIAGNOSIS — R7401 Elevation of levels of liver transaminase levels: Secondary | ICD-10-CM

## 2022-06-13 DIAGNOSIS — F410 Panic disorder [episodic paroxysmal anxiety] without agoraphobia: Secondary | ICD-10-CM | POA: Diagnosis present

## 2022-06-13 DIAGNOSIS — D500 Iron deficiency anemia secondary to blood loss (chronic): Secondary | ICD-10-CM

## 2022-06-13 DIAGNOSIS — Z7901 Long term (current) use of anticoagulants: Secondary | ICD-10-CM

## 2022-06-13 DIAGNOSIS — Z79899 Other long term (current) drug therapy: Secondary | ICD-10-CM

## 2022-06-13 DIAGNOSIS — F4323 Adjustment disorder with mixed anxiety and depressed mood: Secondary | ICD-10-CM | POA: Diagnosis not present

## 2022-06-13 DIAGNOSIS — Z8042 Family history of malignant neoplasm of prostate: Secondary | ICD-10-CM

## 2022-06-13 DIAGNOSIS — C9110 Chronic lymphocytic leukemia of B-cell type not having achieved remission: Principal | ICD-10-CM | POA: Diagnosis present

## 2022-06-13 DIAGNOSIS — D709 Neutropenia, unspecified: Secondary | ICD-10-CM | POA: Diagnosis present

## 2022-06-13 DIAGNOSIS — I5032 Chronic diastolic (congestive) heart failure: Secondary | ICD-10-CM | POA: Diagnosis present

## 2022-06-13 DIAGNOSIS — I480 Paroxysmal atrial fibrillation: Secondary | ICD-10-CM | POA: Diagnosis present

## 2022-06-13 DIAGNOSIS — Z20822 Contact with and (suspected) exposure to covid-19: Secondary | ICD-10-CM | POA: Diagnosis present

## 2022-06-13 DIAGNOSIS — Z823 Family history of stroke: Secondary | ICD-10-CM

## 2022-06-13 DIAGNOSIS — I110 Hypertensive heart disease with heart failure: Secondary | ICD-10-CM | POA: Diagnosis present

## 2022-06-13 DIAGNOSIS — I951 Orthostatic hypotension: Secondary | ICD-10-CM | POA: Diagnosis present

## 2022-06-13 DIAGNOSIS — N179 Acute kidney failure, unspecified: Secondary | ICD-10-CM | POA: Diagnosis not present

## 2022-06-13 DIAGNOSIS — Z8546 Personal history of malignant neoplasm of prostate: Secondary | ICD-10-CM

## 2022-06-13 DIAGNOSIS — R569 Unspecified convulsions: Secondary | ICD-10-CM

## 2022-06-13 DIAGNOSIS — Z800 Family history of malignant neoplasm of digestive organs: Secondary | ICD-10-CM

## 2022-06-13 DIAGNOSIS — N1832 Chronic kidney disease, stage 3b: Secondary | ICD-10-CM

## 2022-06-13 DIAGNOSIS — D708 Other neutropenia: Secondary | ICD-10-CM

## 2022-06-13 DIAGNOSIS — Z953 Presence of xenogenic heart valve: Secondary | ICD-10-CM

## 2022-06-13 DIAGNOSIS — R791 Abnormal coagulation profile: Secondary | ICD-10-CM | POA: Diagnosis present

## 2022-06-13 DIAGNOSIS — Z86711 Personal history of pulmonary embolism: Secondary | ICD-10-CM

## 2022-06-13 DIAGNOSIS — I350 Nonrheumatic aortic (valve) stenosis: Secondary | ICD-10-CM | POA: Diagnosis present

## 2022-06-13 DIAGNOSIS — D61818 Other pancytopenia: Secondary | ICD-10-CM | POA: Diagnosis present

## 2022-06-13 DIAGNOSIS — R40241 Glasgow coma scale score 13-15, unspecified time: Secondary | ICD-10-CM

## 2022-06-13 DIAGNOSIS — Z9221 Personal history of antineoplastic chemotherapy: Secondary | ICD-10-CM

## 2022-06-13 DIAGNOSIS — Z952 Presence of prosthetic heart valve: Secondary | ICD-10-CM

## 2022-06-13 DIAGNOSIS — Z87891 Personal history of nicotine dependence: Secondary | ICD-10-CM

## 2022-06-13 DIAGNOSIS — D696 Thrombocytopenia, unspecified: Secondary | ICD-10-CM

## 2022-06-13 LAB — URINALYSIS, ROUTINE W REFLEX MICROSCOPIC
Bacteria, UA: NONE SEEN
Bilirubin Urine: NEGATIVE
Glucose, UA: NEGATIVE mg/dL
Ketones, ur: NEGATIVE mg/dL
Leukocytes,Ua: NEGATIVE
Nitrite: NEGATIVE
Protein, ur: 100 mg/dL — AB
Specific Gravity, Urine: 1.017 (ref 1.005–1.030)
pH: 5 (ref 5.0–8.0)

## 2022-06-13 LAB — SARS CORONAVIRUS 2 BY RT PCR: SARS Coronavirus 2 by RT PCR: NEGATIVE

## 2022-06-13 LAB — CBC WITH DIFFERENTIAL/PLATELET
Abs Immature Granulocytes: 0.01 10*3/uL (ref 0.00–0.07)
Basophils Absolute: 0 10*3/uL (ref 0.0–0.1)
Basophils Relative: 0 %
Eosinophils Absolute: 0 10*3/uL (ref 0.0–0.5)
Eosinophils Relative: 0 %
HCT: 35.1 % — ABNORMAL LOW (ref 39.0–52.0)
Hemoglobin: 11.6 g/dL — ABNORMAL LOW (ref 13.0–17.0)
Immature Granulocytes: 2 %
Lymphocytes Relative: 40 %
Lymphs Abs: 0.2 10*3/uL — ABNORMAL LOW (ref 0.7–4.0)
MCH: 32 pg (ref 26.0–34.0)
MCHC: 33 g/dL (ref 30.0–36.0)
MCV: 96.7 fL (ref 80.0–100.0)
Monocytes Absolute: 0.1 10*3/uL (ref 0.1–1.0)
Monocytes Relative: 15 %
Neutro Abs: 0.2 10*3/uL — CL (ref 1.7–7.7)
Neutrophils Relative %: 43 %
Platelets: 47 10*3/uL — ABNORMAL LOW (ref 150–400)
RBC: 3.63 MIL/uL — ABNORMAL LOW (ref 4.22–5.81)
RDW: 14.1 % (ref 11.5–15.5)
WBC: 0.5 10*3/uL — CL (ref 4.0–10.5)
nRBC: 0 % (ref 0.0–0.2)

## 2022-06-13 LAB — TROPONIN I (HIGH SENSITIVITY): Troponin I (High Sensitivity): 63 ng/L — ABNORMAL HIGH (ref <18)

## 2022-06-13 LAB — COMPREHENSIVE METABOLIC PANEL
ALT: 46 U/L — ABNORMAL HIGH (ref 0–44)
AST: 85 U/L — ABNORMAL HIGH (ref 15–41)
Albumin: 3.3 g/dL — ABNORMAL LOW (ref 3.5–5.0)
Alkaline Phosphatase: 157 U/L — ABNORMAL HIGH (ref 38–126)
Anion gap: 8 (ref 5–15)
BUN: 41 mg/dL — ABNORMAL HIGH (ref 8–23)
CO2: 24 mmol/L (ref 22–32)
Calcium: 8.8 mg/dL — ABNORMAL LOW (ref 8.9–10.3)
Chloride: 107 mmol/L (ref 98–111)
Creatinine, Ser: 2.07 mg/dL — ABNORMAL HIGH (ref 0.61–1.24)
GFR, Estimated: 33 mL/min — ABNORMAL LOW (ref >60)
Glucose, Bld: 100 mg/dL — ABNORMAL HIGH (ref 70–99)
Potassium: 4.1 mmol/L (ref 3.5–5.1)
Sodium: 139 mmol/L (ref 135–145)
Total Bilirubin: 0.5 mg/dL (ref 0.3–1.2)
Total Protein: 6.1 g/dL — ABNORMAL LOW (ref 6.5–8.1)

## 2022-06-13 LAB — PROTIME-INR
INR: 3.5 — ABNORMAL HIGH (ref 0.8–1.2)
Prothrombin Time: 34.5 seconds — ABNORMAL HIGH (ref 11.4–15.2)

## 2022-06-13 LAB — ETHANOL: Alcohol, Ethyl (B): <10 mg/dL (ref <10)

## 2022-06-13 LAB — CBG MONITORING, ED: Glucose-Capillary: 106 mg/dL — ABNORMAL HIGH (ref 70–99)

## 2022-06-13 LAB — TSH: TSH: 2.465 u[IU]/mL (ref 0.350–4.500)

## 2022-06-13 LAB — LACTIC ACID, PLASMA: Lactic Acid, Venous: 1 mmol/L (ref 0.5–1.9)

## 2022-06-13 LAB — LIPASE, BLOOD: Lipase: 69 U/L — ABNORMAL HIGH (ref 11–51)

## 2022-06-13 LAB — AMMONIA: Ammonia: 14 umol/L (ref 9–35)

## 2022-06-13 MED ORDER — VANCOMYCIN HCL 1500 MG/300ML IV SOLN
1500.0000 mg | Freq: Once | INTRAVENOUS | Status: AC
  Administered 2022-06-13: 1500 mg via INTRAVENOUS
  Filled 2022-06-13: qty 300

## 2022-06-13 MED ORDER — CEFEPIME HCL 2 G IV SOLR
2.0000 g | Freq: Once | INTRAVENOUS | Status: AC
  Administered 2022-06-14: 2 g via INTRAVENOUS
  Filled 2022-06-13 (×2): qty 12.5

## 2022-06-13 MED ORDER — LACTATED RINGERS IV BOLUS
1000.0000 mL | Freq: Once | INTRAVENOUS | Status: AC
Start: 2022-06-13 — End: 2022-06-15
  Administered 2022-06-14: 1000 mL via INTRAVENOUS

## 2022-06-13 MED ORDER — LACTATED RINGERS IV BOLUS
1000.0000 mL | Freq: Once | INTRAVENOUS | Status: AC
Start: 2022-06-13 — End: 2022-06-14
  Administered 2022-06-13: 1000 mL via INTRAVENOUS

## 2022-06-13 NOTE — ED Notes (Signed)
Lab draw unsuccessful x2.

## 2022-06-13 NOTE — ED Provider Triage Note (Addendum)
Emergency Medicine Provider Triage Evaluation Note  [name] , a 76 y.o. male  was evaluated in triage.  Pt complains of confusion and weakness.  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   He says he hasn't been forgetful.  His niece says last year he was dehydrated, and she feels like he is a little confused.   He was seen at psychologist today and despite breaking the glass I am unable to view these notes.   He knows the year, the month, the date, his name, and location, although his first response was "hell."  Nausea with out vomiting, he reports "explosive diarrhea."   Physical Exam  BP (!) 120/54 (BP Location: Right Arm)   Pulse 87   Temp 99.6 F (37.6 C) (Oral)   Resp 16   Ht $R'5\' 10"'cf$  (1.778 m)   Wt 77.1 kg   SpO2 97%   BMI 24.39 kg/m  Gen:   Awake, no distress   Resp:  Normal effort  MSK:   Moves extremities without difficulty  Other:  Normal speech, tremulous  Medical Decision Making  Medically screening exam initiated at 7:05 PM.  Appropriate orders placed.  [name] was informed that the remainder of the evaluation will be completed by another provider, this initial triage assessment does not replace that evaluation, and the importance of remaining in the ED until their evaluation is complete.     Lorin Glass, [place] 06/13/22 1913   He was bit by a tick about 4 weeks ago.     Lorin Glass, [place] 06/13/22 1920

## 2022-06-13 NOTE — ED Provider Notes (Signed)
[facility] DEPT Provider Note   CSN: [id number] Arrival date & time: 06/13/22  1806     History Chief Complaint  Patient presents with   Weakness   chemo patient    HPI [name] is a 76 y.o. male presenting for weakness. Patient is unable to provide much context.  However, patient's niece provides further collateral.  She states that over the past 3 days he has been less interactive taking less p.o. intake.  She endorses that he has been irritable.  He denies fevers chills, nausea vomiting, syncope shortness of breath, chest pain, abdominal pain.  He does endorse intermittent confusion where he did not know where he was. The patient is actively on chemotherapy for leukemia with the last dose administered last month.  Per last H/O Notes: Rituxan/Bendamustine -- started on 01/03/2022, s/p cycle 3 -- Rituxan DC'd due to leukopenia on 03/28/2022 Coumadin 5 mg po q day Ziextenzo 6 mg SQ as indicated for low WBC count/ANC  Patient's recorded medical, surgical, social, medication list and allergies were reviewed in the Snapshot window as part of the initial history.   Review of Systems   Review of Systems  Constitutional:  Positive for fatigue. Negative for chills and fever.  HENT:  Negative for ear pain and sore throat.   Eyes:  Negative for pain and visual disturbance.  Respiratory:  Negative for cough and shortness of breath.   Cardiovascular:  Negative for chest pain and palpitations.  Gastrointestinal:  Negative for abdominal pain and vomiting.  Genitourinary:  Negative for dysuria and hematuria.  Musculoskeletal:  Negative for arthralgias and back pain.  Skin:  Negative for color change and rash.  Neurological:  Negative for seizures and syncope.  Psychiatric/Behavioral:  Positive for confusion.   All other systems reviewed and are negative.   Physical Exam Updated Vital Signs BP (!) 91/54   Pulse 73   Temp 99.6 F (37.6 C) (Oral)    Resp 19   Ht '5\' 10"'$  (1.778 m)   Wt 77.1 kg   SpO2 95%   BMI 24.39 kg/m  Physical Exam Vitals and nursing note reviewed.  Constitutional:      General: He is not in acute distress.    Appearance: He is well-developed.  HENT:     Head: Normocephalic and atraumatic.  Eyes:     Conjunctiva/sclera: Conjunctivae normal.  Cardiovascular:     Rate and Rhythm: Normal rate and regular rhythm.     Heart sounds: No murmur heard. Pulmonary:     Effort: Pulmonary effort is normal. No respiratory distress.     Breath sounds: Normal breath sounds.  Abdominal:     Palpations: Abdomen is soft.     Tenderness: There is no abdominal tenderness.  Musculoskeletal:        General: No swelling.     Cervical back: Neck supple.  Skin:    General: Skin is warm and dry.     Capillary Refill: Capillary refill takes less than 2 seconds.  Neurological:     Mental Status: He is alert.  Psychiatric:        Mood and Affect: Mood normal.      ED Course/ Medical Decision Making/ A&P Clinical Course as of 06/13/22 2339  Thu Jun 13, 2022  2332 Room WA-04 Oncology recommended IVABX. I messaged the patient's primary oncologist who will be on campus in AM Thanks! Eris Breck MD- [phone]    [CC]    Clinical Course User Index [  CC] Tretha Sciara, MD    Procedures Procedures   Medications Ordered in ED Medications  vancomycin (VANCOREADY) IVPB 1500 mg/300 mL (1,500 mg Intravenous New Bag/Given 06/13/22 2326)  ceFEPIme (MAXIPIME) 2 g in sodium chloride 0.9 % 100 mL IVPB (has no administration in time range)  lactated ringers bolus 1,000 mL (has no administration in time range)  lactated ringers bolus 1,000 mL (1,000 mLs Intravenous New Bag/Given 06/13/22 2320)    Medical Decision Making:    [name] is a 76 y.o. male who presented to the ED today with altered mental status detailed above.     Additional history discussed with patient's family/caregivers.  Patient placed on continuous  vitals and telemetry monitoring while in ED which was reviewed periodically.   Complete initial physical exam performed, notably the patient  was hemodynamically stable, ambulatory, tolerating p.o. intake at this time.  He is diffusely confused.      Reviewed and confirmed nursing documentation for past medical history, family history, social history.    Initial Assessment:   Patient's history of present onset vitals and findings are most concerning for recurrence of his neutropenia versus conversion of his CLL to a LL.  Broad screening evaluation was ordered including screening lab work.  Most notable for CBC abnormalities including leukopenia, neutropenia, thrombocytopenia.  Given his developing altered mental status, patient started on sepsis protocol including IV fluids and broad-spectrum antibiotics including cefepime and vancomycin. Notably, he also has worsening liver dysfunction with a rising INR, worsening kidney function with rising creatinine and will warrant ongoing care and management. Discussed case with oncologist on call and hospitalist on call.  Patient arranged for admission at this time with no further acute events.  Pending hospitalist admission orders at this time but otherwise patient to be admitted.  Clinical Impression:  1. Altered mental status, unspecified altered mental status type         Data Unavailable   Final Clinical Impression(s) / ED Diagnoses Final diagnoses:  Altered mental status, unspecified altered mental status type    Rx / DC Orders ED Discharge Orders     None         Tretha Sciara, MD 06/13/22 2340

## 2022-06-13 NOTE — ED Triage Notes (Signed)
Patient reports that he is a chemo patient and states he has had AMS and weakness x 3 days ago. Patient reports a history of Agent Orange.

## 2022-06-13 NOTE — ED Notes (Signed)
Lab draw unsuccessful 

## 2022-06-13 NOTE — Progress Notes (Signed)
                          Date: 06/13/2022  Treatment Plan: Diagnosis F99 (Unspecified mental disorder) [n/a]  309.28 (Adjustment disorder with mixed anxiety and depressed mood) [n/a]  Symptoms Depressed or irritable mood. (Status: maintained) -- No Description Entered  Diminished interest in or enjoyment of activities. (Status: maintained) -- No Description Entered  Experiences disturbing and persistent thoughts, images, and/or perceptions of the traumatic event. (Status: maintained) -- No Description Entered  Has been exposed to a traumatic event involving actual or perceived threat of death or serious injury. (Status: maintained) -- No Description Entered  Intentionally avoids thoughts, feelings, or discussions related to the traumatic event. (Status: maintained) -- No Description Entered  Lack of energy. (Status: maintained) -- No Description Entered  Sad affect, social withdrawal, anxiety, loss of interest in activities, and low energy. (Status: maintained) -- No Description Entered  Symptoms present more than one month. (Status: maintained) -- No Description Entered  Medication Status compliance  Safety none  If Suicidal or Homicidal State Action Taken: unspecified  Current Risk: low Medications Lorazepam (Dosage: .5mg)  Meclizine (Dosage: unknown)  Sertrline (Dosage: 100mg)  Xarelto (Dosage: unknown)  Objectives Related Problem: Develop healthy thinking patterns and beliefs about self, others, and the world that lead to the alleviation and help prevent the relapse of depression. Description: Implement mindfulness techniques for relapse prevention. Target Date: 2022-11-07 Frequency: Daily Modality: individual Progress: 70%  Related Problem: Develop healthy thinking patterns and beliefs about self, others, and the world that lead to the alleviation and help prevent the relapse of depression. Description: Learn and implement conflict resolution skills  to resolve interpersonal problems. Target Date: 2022-07-08 Frequency: Daily Modality: individual Progress: 100%-completed  Related Problem: Develop healthy thinking patterns and beliefs about self, others, and the world that lead to the alleviation and help prevent the relapse of depression. Description: Learn and implement behavioral strategies to overcome depression. Target Date: 2022-11-07 Frequency: Daily Modality: individual Progress: 70%  Related Problem: Develop healthy thinking patterns and beliefs about self, others, and the world that lead to the alleviation and help prevent the relapse of depression. Description: Describe current and past experiences with depression including their impact on functioning and attempts to resolve it. Target Date: 2022-11-07 Frequency: Daily Modality: individual Progress: 70%  Client Response full compliance  Service Location Location, [address] [place], [place] [address]  Service Code cpt 90834P  Self care activities  Emotion regulation skills  Distress tolerance skill  Validate/empathize  Identified an insight  Facilitate problem solving  Normalize/Reframe  Comments  Dx: F43.21  [name] agreed to having a video session due to the pandemic. He was at home and I was in my home office.  Meds: Sertraline (100mg), Vit. D, Meclizine, blood pressure med, cholesterol med, Lorazepam (.5 mg as needed).  Goals: Seeking relief to depressive feelings of sadness and lack of motivation/interest in activities. Also, has some anxieties related to health and his own mortality. Goal Date: 11-10-22   [name] says he is not doing well. Has not been eating well and sounds disoriented. Was unable to navigate [employer] and could only use the phone.Told him he needs to get his niece back to help or call EMS to get to the hospital. He has called her and she has not responded. He has been talking regularly with his Tai [employer] friend. I suggested he request his friend  take him to   the ER. Told him I would call his emergency contact while he stays on line. Spoke with [name] Nicoson-Allen and she said she will make arrangements to get to [name] in the next 60-90 minutes and have him taken to hospital. [name] agrees. [name] said she has been trying to reach [name] all day. She has my contact info. Told her if it is going to be longer, EMS should be called. She will keep me posted.             , LEWIS, PhD  Time: 3:15a-4:00 45 minutes             

## 2022-06-14 ENCOUNTER — Encounter (HOSPITAL_COMMUNITY): Payer: Self-pay | Admitting: Internal Medicine

## 2022-06-14 ENCOUNTER — Observation Stay (HOSPITAL_COMMUNITY): Payer: No Typology Code available for payment source

## 2022-06-14 ENCOUNTER — Ambulatory Visit: Payer: Medicare Other | Admitting: Family Medicine

## 2022-06-14 ENCOUNTER — Other Ambulatory Visit (HOSPITAL_COMMUNITY): Payer: Medicare Other

## 2022-06-14 ENCOUNTER — Inpatient Hospital Stay (HOSPITAL_COMMUNITY): Payer: No Typology Code available for payment source

## 2022-06-14 DIAGNOSIS — Z952 Presence of prosthetic heart valve: Secondary | ICD-10-CM | POA: Diagnosis not present

## 2022-06-14 DIAGNOSIS — K769 Liver disease, unspecified: Secondary | ICD-10-CM | POA: Diagnosis present

## 2022-06-14 DIAGNOSIS — D709 Neutropenia, unspecified: Secondary | ICD-10-CM

## 2022-06-14 DIAGNOSIS — Z8042 Family history of malignant neoplasm of prostate: Secondary | ICD-10-CM | POA: Diagnosis not present

## 2022-06-14 DIAGNOSIS — Z7901 Long term (current) use of anticoagulants: Secondary | ICD-10-CM | POA: Diagnosis not present

## 2022-06-14 DIAGNOSIS — D469 Myelodysplastic syndrome, unspecified: Secondary | ICD-10-CM | POA: Diagnosis not present

## 2022-06-14 DIAGNOSIS — Z87891 Personal history of nicotine dependence: Secondary | ICD-10-CM | POA: Diagnosis not present

## 2022-06-14 DIAGNOSIS — R5383 Other fatigue: Secondary | ICD-10-CM | POA: Diagnosis not present

## 2022-06-14 DIAGNOSIS — Z79899 Other long term (current) drug therapy: Secondary | ICD-10-CM | POA: Diagnosis not present

## 2022-06-14 DIAGNOSIS — R531 Weakness: Secondary | ICD-10-CM

## 2022-06-14 DIAGNOSIS — Z823 Family history of stroke: Secondary | ICD-10-CM | POA: Diagnosis not present

## 2022-06-14 DIAGNOSIS — I350 Nonrheumatic aortic (valve) stenosis: Secondary | ICD-10-CM | POA: Diagnosis not present

## 2022-06-14 DIAGNOSIS — G3184 Mild cognitive impairment, so stated: Secondary | ICD-10-CM | POA: Diagnosis not present

## 2022-06-14 DIAGNOSIS — C9110 Chronic lymphocytic leukemia of B-cell type not having achieved remission: Secondary | ICD-10-CM

## 2022-06-14 DIAGNOSIS — E785 Hyperlipidemia, unspecified: Secondary | ICD-10-CM | POA: Diagnosis present

## 2022-06-14 DIAGNOSIS — D696 Thrombocytopenia, unspecified: Secondary | ICD-10-CM | POA: Diagnosis not present

## 2022-06-14 DIAGNOSIS — Z20822 Contact with and (suspected) exposure to covid-19: Secondary | ICD-10-CM | POA: Diagnosis present

## 2022-06-14 DIAGNOSIS — Z953 Presence of xenogenic heart valve: Secondary | ICD-10-CM | POA: Diagnosis not present

## 2022-06-14 DIAGNOSIS — D72819 Decreased white blood cell count, unspecified: Secondary | ICD-10-CM | POA: Diagnosis not present

## 2022-06-14 DIAGNOSIS — R7401 Elevation of levels of liver transaminase levels: Secondary | ICD-10-CM

## 2022-06-14 DIAGNOSIS — D61818 Other pancytopenia: Secondary | ICD-10-CM

## 2022-06-14 DIAGNOSIS — N179 Acute kidney failure, unspecified: Secondary | ICD-10-CM

## 2022-06-14 DIAGNOSIS — R0609 Other forms of dyspnea: Secondary | ICD-10-CM

## 2022-06-14 DIAGNOSIS — N1832 Chronic kidney disease, stage 3b: Secondary | ICD-10-CM

## 2022-06-14 DIAGNOSIS — I480 Paroxysmal atrial fibrillation: Secondary | ICD-10-CM | POA: Diagnosis present

## 2022-06-14 DIAGNOSIS — F410 Panic disorder [episodic paroxysmal anxiety] without agoraphobia: Secondary | ICD-10-CM | POA: Diagnosis present

## 2022-06-14 DIAGNOSIS — R4182 Altered mental status, unspecified: Secondary | ICD-10-CM

## 2022-06-14 DIAGNOSIS — I110 Hypertensive heart disease with heart failure: Secondary | ICD-10-CM | POA: Diagnosis present

## 2022-06-14 DIAGNOSIS — Z86711 Personal history of pulmonary embolism: Secondary | ICD-10-CM

## 2022-06-14 DIAGNOSIS — I951 Orthostatic hypotension: Secondary | ICD-10-CM | POA: Diagnosis present

## 2022-06-14 DIAGNOSIS — Z8546 Personal history of malignant neoplasm of prostate: Secondary | ICD-10-CM | POA: Diagnosis not present

## 2022-06-14 DIAGNOSIS — I5032 Chronic diastolic (congestive) heart failure: Secondary | ICD-10-CM | POA: Diagnosis present

## 2022-06-14 DIAGNOSIS — Z9221 Personal history of antineoplastic chemotherapy: Secondary | ICD-10-CM | POA: Diagnosis not present

## 2022-06-14 DIAGNOSIS — Z800 Family history of malignant neoplasm of digestive organs: Secondary | ICD-10-CM | POA: Diagnosis not present

## 2022-06-14 LAB — CBC WITH DIFFERENTIAL/PLATELET
Abs Immature Granulocytes: 0.01 10*3/uL (ref 0.00–0.07)
Basophils Absolute: 0 10*3/uL (ref 0.0–0.1)
Basophils Relative: 3 %
Eosinophils Absolute: 0 10*3/uL (ref 0.0–0.5)
Eosinophils Relative: 0 %
HCT: 32.9 % — ABNORMAL LOW (ref 39.0–52.0)
Hemoglobin: 10 g/dL — ABNORMAL LOW (ref 13.0–17.0)
Immature Granulocytes: 1 %
Lymphocytes Relative: 61 %
Lymphs Abs: 0.4 10*3/uL — ABNORMAL LOW (ref 0.7–4.0)
MCH: 31.6 pg (ref 26.0–34.0)
MCHC: 30.4 g/dL (ref 30.0–36.0)
MCV: 104.1 fL — ABNORMAL HIGH (ref 80.0–100.0)
Monocytes Absolute: 0.1 10*3/uL (ref 0.1–1.0)
Monocytes Relative: 14 %
Neutro Abs: 0.2 10*3/uL — CL (ref 1.7–7.7)
Neutrophils Relative %: 21 %
Platelets: 43 10*3/uL — ABNORMAL LOW (ref 150–400)
RBC: 3.16 MIL/uL — ABNORMAL LOW (ref 4.22–5.81)
RDW: 14.3 % (ref 11.5–15.5)
WBC: 0.7 10*3/uL — CL (ref 4.0–10.5)
nRBC: 0 % (ref 0.0–0.2)

## 2022-06-14 LAB — ECHOCARDIOGRAM COMPLETE
AR max vel: 0.86 cm2
AV Area VTI: 0.88 cm2
AV Area mean vel: 0.85 cm2
AV Mean grad: 47 mmHg
AV Peak grad: 71.6 mmHg
Ao pk vel: 4.23 m/s
Area-P 1/2: 2.87 cm2
Height: 70 in
S' Lateral: 3.8 cm
Weight: 2719.59 oz

## 2022-06-14 LAB — COMPREHENSIVE METABOLIC PANEL
ALT: 36 U/L (ref 0–44)
AST: 69 U/L — ABNORMAL HIGH (ref 15–41)
Albumin: 2.7 g/dL — ABNORMAL LOW (ref 3.5–5.0)
Alkaline Phosphatase: 123 U/L (ref 38–126)
Anion gap: 7 (ref 5–15)
BUN: 35 mg/dL — ABNORMAL HIGH (ref 8–23)
CO2: 22 mmol/L (ref 22–32)
Calcium: 8.2 mg/dL — ABNORMAL LOW (ref 8.9–10.3)
Chloride: 111 mmol/L (ref 98–111)
Creatinine, Ser: 1.67 mg/dL — ABNORMAL HIGH (ref 0.61–1.24)
GFR, Estimated: 42 mL/min — ABNORMAL LOW (ref >60)
Glucose, Bld: 93 mg/dL (ref 70–99)
Potassium: 4.1 mmol/L (ref 3.5–5.1)
Sodium: 140 mmol/L (ref 135–145)
Total Bilirubin: 0.6 mg/dL (ref 0.3–1.2)
Total Protein: 4.9 g/dL — ABNORMAL LOW (ref 6.5–8.1)

## 2022-06-14 LAB — PROTIME-INR
INR: 4.2 (ref 0.8–1.2)
Prothrombin Time: 39.9 seconds — ABNORMAL HIGH (ref 11.4–15.2)

## 2022-06-14 LAB — CK: Total CK: 124 U/L (ref 49–397)

## 2022-06-14 LAB — GLUCOSE, CAPILLARY: Glucose-Capillary: 86 mg/dL (ref 70–99)

## 2022-06-14 LAB — MAGNESIUM: Magnesium: 1.8 mg/dL (ref 1.7–2.4)

## 2022-06-14 MED ORDER — ACYCLOVIR 400 MG PO TABS
400.0000 mg | ORAL_TABLET | Freq: Every day | ORAL | Status: DC
  Administered 2022-06-14 – 2022-06-19 (×6): 400 mg via ORAL
  Filled 2022-06-14 (×6): qty 1

## 2022-06-14 MED ORDER — LACTATED RINGERS IV SOLN
INTRAVENOUS | Status: AC
Start: 2022-06-14 — End: 2022-06-14

## 2022-06-14 MED ORDER — ACETAMINOPHEN 650 MG RE SUPP: 650.0000 mg | Freq: Four times a day (QID) | RECTAL | Status: DC | PRN

## 2022-06-14 MED ORDER — SERTRALINE HCL 100 MG PO TABS
100.0000 mg | ORAL_TABLET | Freq: Every morning | ORAL | Status: DC
  Administered 2022-06-14 – 2022-06-20 (×7): 100 mg via ORAL
  Filled 2022-06-14 (×7): qty 1

## 2022-06-14 MED ORDER — VANCOMYCIN HCL 1250 MG/250ML IV SOLN
1250.0000 mg | INTRAVENOUS | Status: DC
Start: 2022-06-15 — End: 2022-06-14

## 2022-06-14 MED ORDER — ONDANSETRON HCL 4 MG/2ML IJ SOLN: 4.0000 mg | Freq: Four times a day (QID) | INTRAMUSCULAR | Status: DC | PRN

## 2022-06-14 MED ORDER — ACETAMINOPHEN 325 MG PO TABS: 650.0000 mg | ORAL_TABLET | Freq: Four times a day (QID) | ORAL | Status: DC | PRN

## 2022-06-14 MED ORDER — ADULT MULTIVITAMIN W/MINERALS CH
1.0000 | ORAL_TABLET | Freq: Every day | ORAL | Status: DC
  Administered 2022-06-14 – 2022-06-20 (×7): 1 via ORAL
  Filled 2022-06-14 (×7): qty 1

## 2022-06-14 MED ORDER — ENSURE ENLIVE PO LIQD
237.0000 mL | Freq: Three times a day (TID) | ORAL | Status: DC
  Administered 2022-06-14 – 2022-06-15 (×2): 237 mL via ORAL

## 2022-06-14 MED ORDER — LACTATED RINGERS IV SOLN: INTRAVENOUS | Status: AC

## 2022-06-14 MED ORDER — METRONIDAZOLE 500 MG/100ML IV SOLN
500.0000 mg | Freq: Two times a day (BID) | INTRAVENOUS | Status: DC
  Administered 2022-06-14 – 2022-06-17 (×7): 500 mg via INTRAVENOUS
  Filled 2022-06-14 (×8): qty 100

## 2022-06-14 MED ORDER — PANTOPRAZOLE SODIUM 40 MG PO TBEC
40.0000 mg | DELAYED_RELEASE_TABLET | Freq: Every day | ORAL | Status: DC
  Administered 2022-06-14 – 2022-06-20 (×7): 40 mg via ORAL
  Filled 2022-06-14 (×7): qty 1

## 2022-06-14 MED ORDER — ONDANSETRON HCL 4 MG PO TABS: 4.0000 mg | ORAL_TABLET | Freq: Four times a day (QID) | ORAL | Status: DC | PRN

## 2022-06-14 MED ORDER — WARFARIN - PHARMACIST DOSING INPATIENT: Freq: Every day | Status: DC

## 2022-06-14 MED ORDER — VANCOMYCIN HCL IN DEXTROSE 1-5 GM/200ML-% IV SOLN
1000.0000 mg | INTRAVENOUS | Status: DC
Start: 2022-06-14 — End: 2022-06-17
  Administered 2022-06-14 – 2022-06-16 (×3): 1000 mg via INTRAVENOUS
  Filled 2022-06-14 (×3): qty 200

## 2022-06-14 MED ORDER — ROSUVASTATIN CALCIUM 5 MG PO TABS
5.0000 mg | ORAL_TABLET | Freq: Every day | ORAL | Status: DC
  Administered 2022-06-14 – 2022-06-20 (×7): 5 mg via ORAL
  Filled 2022-06-14 (×7): qty 1

## 2022-06-14 MED ORDER — LACTATED RINGERS IV BOLUS
1000.0000 mL | Freq: Once | INTRAVENOUS | Status: AC
  Administered 2022-06-14: 1000 mL via INTRAVENOUS

## 2022-06-14 MED ORDER — SODIUM CHLORIDE 0.9 % IV SOLN
2.0000 g | Freq: Two times a day (BID) | INTRAVENOUS | Status: DC
  Administered 2022-06-14 – 2022-06-17 (×6): 2 g via INTRAVENOUS
  Filled 2022-06-14 (×6): qty 12.5

## 2022-06-14 MED ORDER — GADOBUTROL 1 MMOL/ML IV SOLN
7.8000 mL | Freq: Once | INTRAVENOUS | Status: AC | PRN
  Administered 2022-06-14: 7.8 mL via INTRAVENOUS

## 2022-06-14 NOTE — Consult Note (Signed)
I know [name] well.  He is a 76 year old African-American male.  He has history of recurrent CLL/small lymphocytic lymphoma.  He has been treated with Rituxan/Bendamustine.  He has had issues with neutropenia.  He has not had any treatment for probably 2 months.  He was last seen in the office on 05/31/2022 because of fever.  He did get I think a dose of Neulasta.  His white cell count was a little on the lower side.  He apparently was found at home yesterday.  One of his friends found him at home.  He was on the floor.  He cannot passed out.  He was just tired.  He had no temperature.  He was brought to the emergency room.  He had no diarrhea.  He had no cough.  He had no headache.  There was some altered mental status.  His labs when he came in  showed a white count of 500.  Hemoglobin 11.6.  Platelet count 47,000.  A and C was 200.  His troponin I level was 63.  His BUN is 41 creatinine 2.07.  He is a little dehydrated.  His LFTs are little bit elevated with AST T of 85 and ALT of 46.  Bilirubin is 0.5.  Ammonia is 14.  He has not contrast CT scan of the head when he came in.  This was unremarkable for any obvious finding.  He had a chest x-ray which was unremarkable.  Urinalysis showed some blood.  There was no bacteria seen.  There is negative for leukocytes.  He does seem a little bit lethargic this morning.  He does have a hard time remembering things.  Typically, he is incredibly sharp.  There is been no bleeding.  He has had no swollen lymph nodes.  There is been no abdominal pain.  He has had no nausea or vomiting.  He has had no cardiac issues.  He does have the mechanical heart valve.  He is on Coumadin.  Again, has been typically incredibly active.  I am not all sure as to why he does have the leukopenia and thrombocytopenia.  I think is going to need to have another bone marrow biopsy done.  The last 1 was done back in January.  He probably will need to have a ultrasound of his  abdomen to see why his LFTs are elevated.  I will know if this is some kind of viral syndrome.  He does not drink alcohol.  He has had no travel.  He has had no new medications.  I would like to hold off on Neupogen for right now.  He is not febrile.  He is hemodynamically stable.  I really want to try to get a bone marrow test and see how it looks in its pristine state.  His vital signs are temperature 98.7.  Pulse 77.  Blood pressure 93/58.  His head and neck exam shows dry oral mucosa.  There is no mucositis.  There is no adenopathy in the neck.  Lungs are clear bilaterally.  He has no wheezes.  Cardiac exam shows a regular rate and rhythm.  He has a 2/6 systolic murmur.  Abdomen is soft.  He has decent bowel sounds.  There is no fluid wave.  There is no palpable liver or spleen tip.  Extremity shows no clubbing, cyanosis or edema.  Skin exam shows no rashes or ecchymoses.  Neurological exam shows some slight lethargy.  He does have a decrease in  his memory.  For right now, I am still not sure as to what is going on.  Again, I also do not know why he has a leukopenia and thrombocytopenia.  His LFTs are little bit elevated.  I do think he is probably going to need another bone marrow test.  We will see if any cultures come back positive.  I will see him on ultrasound of his abdomen.  I will know if he could get a MRI of his brain given his renal function.  He needs IV fluids.  Hopefully his renal function will improve.    He does have the mechanical heart valve.  This plan needs to be looked at to make sure there is nothing going on with this.  I just hate this for [name].  Again he has been incredibly active.  He certainly does not act like he is 76 years old.  He has been incredibly vigorous.  I just hate  the fact that he has had issues.  I know that he will get incredible care from all the staff on [place], MD  Jeneen Rinks 1:5

## 2022-06-14 NOTE — Assessment & Plan Note (Signed)
Stable

## 2022-06-14 NOTE — Subjective & Objective (Signed)
Chief complaint: Lethargy, weakness. History present illness: 76 year old African-American male history of CLL, history of bioprosthetic aortic valve replacement, CKD stage IIIb, history of recurrent PE on Coumadin, history of paroxysmal atrial fibrillation, presents to the ER today with his niece Ruby.  Patient is a poor historian unable to give a accurate history.  Ruby states that she was called by the patient's friend.  She lives [place] of [place].  Apparently patient's friend came over to the patient's house.  Patient was found on the ground in his living room.  Patient states that he wanted to lay down.  Instead of laying down on his couch or sitting in a chair, patient decided to lay on the ground.  He does not know why.  Patient states he has been feeling fatigued for several weeks now.  He states that he did not contact [name] because he was on vacation.  Patient brought to the ER by his knees.  On arrival temp 99.6, heart rate 87, blood pressure 120/54.  Room air saturations of 97%.  Labs showed a white count of 0.5 with an [place] of 200.  Hemoglobin 11.6, platelets 47  Chest x-ray negative for pneumonia.  CT head showed no acute intracranial findings.  [facility] hospitalist contacted for admission.

## 2022-06-14 NOTE — Procedures (Signed)
Interventional Radiology Procedure:   Indications: History of CLL/small lymphocytic lymphoma  Procedure: CT guided bone marrow biopsy  Findings: 2 aspirates and 1 core from right ilium  Complications: None     EBL: Minimal, less than 10 ml  Plan: Bedrest 2 hours   Latroy Gaymon R. Anselm Pancoast, MD  Pager: [phone]

## 2022-06-14 NOTE — Progress Notes (Addendum)
Pharmacy Antibiotic Note  [name] is a 76 y.o. male admitted on 06/13/2022 with neutropenia.  PMH significant for leukemia and undergoing chemotherapy.  Pharmacy has been consulted for Cefepime and Vancomycin dosing.  Vancomycin '1500mg'$  IV x 1 given in ED Cefepime 2gm IV x 1 ordered in ED (not yet given)   Plan: Vancomycin 1250 mg IV Q 36 hrs. Goal AUC 400-550.  Expected AUC: 493.5  SCr used: 2.07 Cefepime 2gm IV q12h Metronidazole '500mg'$  per MD Follow renal function  Height: '5\' 10"'$  (177.8 cm) Weight: 77.1 kg (170 lb) IBW/kg (Calculated) : 73  Temp (24hrs), Avg:98.9 F (37.2 C), Min:98.2 F (36.8 C), Max:99.6 F (37.6 C)  Recent Labs  Lab 06/13/22 2100  WBC 0.5*  CREATININE 2.07*  LATICACIDVEN 1.0    Estimated Creatinine Clearance: 31.3 mL/min (A) (by C-G formula based on SCr of 2.07 mg/dL (H)).    Allergies  Allergen Reactions   Hydrocodone-Acetaminophen Shortness Of Breath   Oxycodone Hcl Shortness Of Breath   Telmisartan-Hctz Shortness Of Breath, Palpitations and Other (See Comments)    Dizziness also   Aspirin Rash and Other (See Comments)    Confusion, also- Cannot take high doses   Atorvastatin Other (See Comments)    Myalgia    Buspirone Hcl Other (See Comments)    Headaches    Codeine Hives, Nausea And Vomiting and Other (See Comments)    Confusion also   Morphine Sulfate Itching   Paroxetine Other (See Comments)    Paresthesias: R arm, R leg, r side of face   Rabeprazole Other (See Comments)    headache   Ramipril Other (See Comments)    Unknown reaction    Antimicrobials this admission: 8/3 Vancomycin >> 8/4 Cefepime >>   8/4 Metronidazole >>  Dose adjustments this admission:    Microbiology results:    Thank you for allowing pharmacy to be a part of this patient's care.  Everette Rank, PharmD 06/14/2022 2:16 AM

## 2022-06-14 NOTE — Progress Notes (Signed)
Echo attempted, patient having biopsy at this time.  [name]

## 2022-06-14 NOTE — Progress Notes (Signed)
Critical lab value INR = 4.2 this morning.  Floor coverage, K. Foust, NP made aware.

## 2022-06-14 NOTE — Progress Notes (Addendum)
PROGRESS NOTE  [name] HCW:[id number] DOB: 09-06-1946   PCP: [name], MD  Patient is from: Home.  Lives alone.  Independently ambulates at baseline.  DOA: 06/13/2022 LOS: 0  Chief complaints Chief Complaint  Patient presents with   Weakness   chemo patient     Brief Narrative / Interim history: 76 year old M with PMH of prostate cancer s/p prostatectomy, CLL, bioprosthetic AVR, recurrent PE on Coumadin, PAF and CKD-3B brought to ED after found down on his knees and hands by his friend.  Patient denied fall but fatigue.  No signs of serious injury.  In ED, mild temp to 99.6.  Otherwise vital stable.  WBC 0.5 with ANC of 200.  Hgb 11.6.  Platelet 47 (baseline in the 90s).  Cr 2.0 (from 1.5 about a months ago).  AST 85.  ALT 46.  Total bili 0.5.  ALP 157.  Lactic acid normal.  Troponin 63.  INR 3.5.  UA with moderate Hgb.  CT head and CXR without acute finding.  EKG sinus rhythm with PACs.  Oncology consulted.  Patient was started on broad-spectrum antibiotics and admitted for altered mental status, AKI and neutropenia.   The next day, AKI and LFT improved.  RUQ [name] with 8 mm right hepatic lobe lesion.  He underwent bone marrow biopsy.  MRI liver, MRI brain and blood cultures ordered.  Remains on broad-spectrum antibiotic for neutropenia  Subjective: Seen and examined earlier this afternoon after he returned from bone marrow biopsy.  Patient's friend at bedside.  Patient has no complaints other than feeling hungry.  Denies pain, shortness of breath, GI or UTI symptoms.  He is awake and oriented x4 except date and month but slow to respond to questions.  Does not seem to have an insight into why he was brought to the hospital.   Objective: Vitals:   06/14/22 0130 06/14/22 0356 06/14/22 0400 06/14/22 0949  BP: (!) 106/56 (!) 93/58  (!) 89/47  Pulse: (!) 53 77  62  Resp: 19 (!) 22  20  Temp:  98.7 F (37.1 C)  99.1 F (37.3 C)  TempSrc:    Oral  SpO2: 93% 96%  94%   Weight:   77.1 kg   Height:   _0  (1.778 m)     Examination:  GENERAL: No apparent distress.  Nontoxic. HEENT: MMM.  Vision and hearing grossly intact.  NECK: Supple.  No apparent JVD.  RESP:  No IWOB.  Fair aeration bilaterally. CVS:  RRR.  2/6 SEM over RUSB. ABD/GI/GU: BS+. Abd soft, NTND.  MSK/EXT:  Moves extremities. No apparent deformity. No edema.  SKIN: no apparent skin lesion or wound NEURO: Awake.  Oriented x4 except date and month.  Limited insight into why he is in the hospital.  No apparent focal neuro deficit. PSYCH: Calm. Normal affect.   Procedures:  8/4-bone marrow biopsy  Microbiology summarized: COVID-19 PCR negative. Blood culture pending  Assessment and plan: Principal Problem:   Altered mental status Active Problems:   Neutropenia ([place])   Acute renal failure superimposed on stage 3b chronic kidney disease (HCC)   Chronic lymphocytic leukemia (HCC)   S/P Bioprosthetic AVR (aortic valve replacement) in 2011   History of pulmonary embolism   Pancytopenia (HCC)   Transaminitis   Liver lesion, right lobe  Altered mental status: Unclear how long this has been going on.  Patient is awake and oriented x4 except date and month but slow to respond to questions and limited  insight.  Difficult to tell if this is slow cognitive decline or acute mental status change.  He has no focal neuro symptoms.  No signs of meningism.  CT head without acute finding.  Ammonia within normal.  He has history of CLL.  RUQ ultrasound with concern for hepatic lesion. -Check B12, B1, TSH and MRI brain. -Follow Lyme serologies. -Reorientation and delirium precautions  AKI on CKD-3A: Likely prerenal.  Improving with IV fluid.  Not on nephrotoxic meds. Recent Labs    01/03/22 0847 01/17/22 1459 01/31/22 0831 02/25/22 1420 03/28/22 0855 04/10/22 1049 05/08/22 1007 05/22/22 0901 06/13/22 2100 06/14/22 0748  BUN _0 28* 27* 24* 27* 32* 41* 35*  CREATININE 1.39* 1.12  1.28* 1.79* 1.29* 1.27* 1.28* 1.53* 2.07* 1.67*  -Continue gentle IV fluid -Monitor  Neutropenia/pancytopenia/CLL: Reportedly has not had chemotherapy in 2 months -Continue broad-spectrum antibiotics pending blood cultures. -Continue home acyclovir -Follow-up bone marrow biopsy -Oncology following.  Liver lesion/mild transaminitis: Improving.  LFT likely from dehydration.  Colonoscopy in 2014 with mild diverticulosis, and no mentions of polyps.  Improving. -MRI liver for right hepatic lesion noted on ultrasound -Check AFP and CEA -Continue monitoring   History of pulmonary embolism/supratherapeutic INR -Hold warfarin while INR is high.  Paroxysmal A-fib: Currently in sinus rhythm.  Not on rate or rhythm control -Continue monitoring   S/P Bioprosthetic AVR-stable   Generalized weakness/fatigue: Independently ambulates at baseline. -PT/OT   Decreased oral intake Body mass index is 24.39 kg/m. Consult dietitian          DVT prophylaxis:  SCDs Start: 06/14/22 0347 in the setting of thrombocytopenia and supratherapeutic INR  Code Status: Full code Family Communication: Updated patient's friend at bedside Level of care: Telemetry Status is: Observation The patient will require care spanning > 2 midnights and should be moved to inpatient because: Altered mental status, neutropenia, AKI   Final disposition: TBD Consultants:  Oncology  Sch Meds:  Scheduled Meds:  acyclovir  400 mg Oral Daily   pantoprazole  40 mg Oral Daily   rosuvastatin  5 mg Oral Daily   sertraline  100 mg Oral q morning   Warfarin - Pharmacist Dosing Inpatient   Does not apply q1600   Continuous Infusions:  ceFEPime (MAXIPIME) IV     lactated ringers 50 mL/hr at 06/14/22 0453   metronidazole 500 mg (06/14/22 0456)   vancomycin     PRN Meds:.acetaminophen **OR** acetaminophen, ondansetron **OR** ondansetron (ZOFRAN) IV  Antimicrobials: Anti-infectives (From admission, onward)    Start      Dose/Rate Route Frequency Ordered Stop   06/15/22 1200  vancomycin (VANCOREADY) IVPB 1250 mg/250 mL  Status:  Discontinued        1,250 mg 166.7 mL/hr over 90 Minutes Intravenous Every 36 hours 06/14/22 0228 06/14/22 0959   06/14/22 2200  vancomycin (VANCOCIN) IVPB 1000 mg/200 mL premix        1,000 mg 200 mL/hr over 60 Minutes Intravenous Every 24 hours 06/14/22 0959     06/14/22 1500  ceFEPIme (MAXIPIME) 2 g in sodium chloride 0.9 % 100 mL IVPB        2 g 200 mL/hr over 30 Minutes Intravenous Every 12 hours 06/14/22 0220     06/14/22 1000  acyclovir (ZOVIRAX) tablet 400 mg        400 mg Oral Daily 06/14/22 0346     06/14/22 0230  metroNIDAZOLE (FLAGYL) IVPB 500 mg        500 mg 100 mL/hr over  60 Minutes Intravenous Every 12 hours 06/14/22 0206     06/13/22 2300  vancomycin (VANCOREADY) IVPB 1500 mg/300 mL        1,500 mg 150 mL/hr over 120 Minutes Intravenous  Once 06/13/22 2255 06/14/22 0124   06/13/22 2300  ceFEPIme (MAXIPIME) 2 g in sodium chloride 0.9 % 100 mL IVPB        2 g 200 mL/hr over 30 Minutes Intravenous  Once 06/13/22 2255 06/14/22 0253        I have personally reviewed the following labs and images: CBC: Recent Labs  Lab 06/13/22 2100 06/14/22 0531  WBC 0.5* 0.7*  NEUTROABS 0.2* 0.2*  HGB 11.6* 10.0*  HCT 35.1* 32.9*  MCV 96.7 104.1*  PLT 47* 43*   BMP &GFR Recent Labs  Lab 06/13/22 2100 06/14/22 0748  NA 139 140  K 4.1 4.1  CL 107 111  CO2 24 22  GLUCOSE 100* 93  BUN 41* 35*  CREATININE 2.07* 1.67*  CALCIUM 8.8* 8.2*  MG  --  1.8   Estimated Creatinine Clearance: 38.9 mL/min (A) (by C-G formula based on SCr of 1.67 mg/dL (H)). Liver & Pancreas: Recent Labs  Lab 06/13/22 2100 06/14/22 0748  AST 85* 69*  ALT 46* 36  ALKPHOS 157* 123  BILITOT 0.5 0.6  PROT 6.1* 4.9*  ALBUMIN 3.3* 2.7*   Recent Labs  Lab 06/13/22 2100  LIPASE 69*   Recent Labs  Lab 06/13/22 2100  AMMONIA 14   Diabetic: No results for input(s): "HGBA1C" in  the last 72 hours. Recent Labs  Lab 06/13/22 1949  GLUCAP 106*   Cardiac Enzymes: Recent Labs  Lab 06/14/22 0846  CKTOTAL 124   No results for input(s): "PROBNP" in the last 8760 hours. Coagulation Profile: Recent Labs  Lab 06/13/22 2200 06/14/22 0531  INR 3.5* 4.2*   Thyroid Function Tests: Recent Labs    06/13/22 2100  TSH 2.465   Lipid Profile: No results for input(s): "CHOL", "HDL", "LDLCALC", "TRIG", "CHOLHDL", "LDLDIRECT" in the last 72 hours. Anemia Panel: No results for input(s): "VITAMINB12", "FOLATE", "FERRITIN", "TIBC", "IRON", "RETICCTPCT" in the last 72 hours. Urine analysis:    Component Value Date/Time   COLORURINE YELLOW 06/13/2022 2300   APPEARANCEUR HAZY (A) 06/13/2022 2300   LABSPEC 1.017 06/13/2022 2300   PHURINE 5.0 06/13/2022 2300   GLUCOSEU NEGATIVE 06/13/2022 2300   GLUCOSEU NEGATIVE 08/11/2017 0843   HGBUR MODERATE (A) 06/13/2022 2300   HGBUR negative 03/20/2010 0843   BILIRUBINUR NEGATIVE 06/13/2022 2300   BILIRUBINUR n 03/22/2011 0000   KETONESUR NEGATIVE 06/13/2022 2300   PROTEINUR 100 (A) 06/13/2022 2300   UROBILINOGEN 0.2 08/11/2017 0843   NITRITE NEGATIVE 06/13/2022 2300   LEUKOCYTESUR NEGATIVE 06/13/2022 2300   Sepsis Labs: Invalid input(s): "PROCALCITONIN", "LACTICIDVEN"  Microbiology: Recent Results (from the past 240 hour(s))  SARS Coronavirus 2 by RT PCR (hospital order, performed in [facility] hospital lab) *cepheid single result test* Anterior Nasal Swab     Status: None   Collection Time: 06/13/22  8:26 PM   Specimen: Anterior Nasal Swab  Result Value Ref Range Status   SARS Coronavirus 2 by RT PCR NEGATIVE NEGATIVE Final    Comment: (NOTE) SARS-CoV-2 target nucleic acids are NOT DETECTED.  The SARS-CoV-2 RNA is generally detectable in upper and lower respiratory specimens during the acute phase of infection. The lowest concentration of SARS-CoV-2 viral copies this assay can detect is 250 copies / mL. A negative  result does not preclude SARS-CoV-2 infection  and should not be used as the sole basis for treatment or other patient management decisions.  A negative result may occur with improper specimen collection / handling, submission of specimen other than nasopharyngeal swab, presence of viral mutation(s) within the areas targeted by this assay, and inadequate number of viral copies (<250 copies / mL). A negative result must be combined with clinical observations, patient history, and epidemiological information.  Fact Sheet for Patients:   https://www.patel.info/  Fact Sheet for Healthcare Providers: https://hall.com/  This test is not yet approved or  cleared by the [place] FDA and has been authorized for detection and/or diagnosis of SARS-CoV-2 by FDA under an Emergency Use Authorization (EUA).  This EUA will remain in effect (meaning this test can be used) for the duration of the COVID-19 declaration under Section 564(b)(1) of the Act, 21 U.S.C. section 360bbb-3(b)(1), unless the authorization is terminated or revoked sooner.  Performed at [facility], [place] [address] [place], [place] [address]     Radiology Studies: US Abdomen Complete  Result Date: 06/14/2022 CLINICAL DATA:  History of chronic lymphocytic leukemia. EXAM: ABDOMEN ULTRASOUND COMPLETE COMPARISON:  CT scan of February 09, 2022. FINDINGS: Gallbladder: No gallstones or wall thickening visualized. No sonographic Murphy sign noted by sonographer. Common bile duct: Diameter: 4 mm which is within normal limits. Liver: 8 mm rounded hypoechoic abnormality is noted in right hepatic lobe which is not diagnostic for a cyst based on ultrasound criteria. Within normal limits in parenchymal echogenicity. Portal vein is patent on color Doppler imaging with normal direction of blood flow towards the liver. IVC: No abnormality visualized. Pancreas: Visualized portion  unremarkable. Spleen: Measures 14.9 x 12.2 x 6.8 cm with calculated volume of 644 cc, consistent with mild splenomegaly. No focal abnormality is noted. Right Kidney: Length: 12.8 cm. Two cysts are noted, the largest measuring 2.9 cm. Echogenicity within normal limits. No mass or hydronephrosis visualized. Left Kidney: Length: 12.1 cm. 2.3 cm simple cyst is seen in lower pole. Echogenicity within normal limits. No mass or hydronephrosis visualized. Abdominal aorta: No aneurysm visualized. Other findings: None. IMPRESSION: 8 mm rounded hypoechoic abnormality noted in right hepatic lobe which is not diagnostic for cyst based on ultrasound criteria. Neoplasm or metastatic disease cannot be excluded. When the patient is clinically stable and able to follow directions and hold their breath (preferably as an outpatient) further evaluation with dedicated abdominal MRI should be considered. Mild splenomegaly is noted. Electronically Signed   By: Marijo Conception M.D.   On: 06/14/2022 08:43   DG Chest 2 View  Result Date: 06/13/2022 CLINICAL DATA:  Altered mental status and weakness for several days, initial encounter EXAM: CHEST - 2 VIEW COMPARISON:  05/31/2022 FINDINGS: Cardiac shadow is within limits. Postsurgical changes are again seen. Aortic calcifications are noted. Lungs are clear. No bony abnormality is noted. IMPRESSION: No active cardiopulmonary disease. Electronically Signed   By: Inez Catalina M.D.   On: 06/13/2022 20:48   CT HEAD WO CONTRAST (5MM)  Result Date: 06/13/2022 CLINICAL DATA:  Dizziness EXAM: CT HEAD WITHOUT CONTRAST TECHNIQUE: Contiguous axial images were obtained from the base of the skull through the vertex without intravenous contrast. RADIATION DOSE REDUCTION: This exam was performed according to the departmental dose-optimization program which includes automated exposure control, adjustment of the mA and/or kV according to patient size and/or use of iterative reconstruction technique.  COMPARISON:  11/07/2021 FINDINGS: Brain: No acute intracranial findings are seen. There are no signs of bleeding within  the cranium. Cortical sulci are prominent. There is decreased density in periventricular and subcortical white matter. Calcifications are seen in basal ganglia on both sides. Vascular: Scattered arterial calcifications are seen. Skull: No fracture is seen. Sinuses/Orbits: There are no air-fluid levels in paranasal sinuses. There is partial opacification of left side of sphenoid sinus. There is minimal mucosal thickening in ethmoid and maxillary sinuses. Other: No significant interval changes are noted. IMPRESSION: No acute intracranial findings are seen in noncontrast CT brain. Atrophy. Small-vessel disease. Chronic sinusitis. Electronically Signed   By: Elmer Picker M.D.   On: 06/13/2022 20:13      Zaliyah Meikle T. [place]air  If 7PM-7AM, please contact night-coverage [email or web address] 06/14/2022, 12:14 PM

## 2022-06-14 NOTE — Assessment & Plan Note (Signed)
Baseline creatinine is approximately 1.3.  Continue with IV fluids.  Avoid nephrotoxic agents.

## 2022-06-14 NOTE — Assessment & Plan Note (Signed)
Continue with Coumadin.

## 2022-06-14 NOTE — Progress Notes (Addendum)
ANTICOAGULATION CONSULT NOTE - Initial Consult  Pharmacy Consult for Warfarin Indication: h/o recurrent PE, bioprosthetic AVR, PAFib  Allergies  Allergen Reactions   Hydrocodone-Acetaminophen Shortness Of Breath   Oxycodone Hcl Shortness Of Breath   Telmisartan-Hctz Shortness Of Breath, Palpitations and Other (See Comments)    Dizziness also   Aspirin Rash and Other (See Comments)    Confusion, also- Cannot take high doses   Atorvastatin Other (See Comments)    Myalgia    Buspirone Hcl Other (See Comments)    Headaches    Codeine Hives, Nausea And Vomiting and Other (See Comments)    Confusion also   Morphine Sulfate Itching   Paroxetine Other (See Comments)    Paresthesias: R arm, R leg, r side of face   Rabeprazole Other (See Comments)    headache   Ramipril Other (See Comments)    Unknown reaction    Patient Measurements: Height: '5\' 10"'$  (177.8 cm) Weight: 77.1 kg (170 lb) IBW/kg (Calculated) : 73  Vital Signs: Temp: 98.2 F (36.8 C) (08/04 0023) Temp Source: Oral (08/04 0023) BP: 106/56 (08/04 0130) Pulse Rate: 53 (08/04 0130)  Labs: Recent Labs    06/13/22 2100 06/13/22 2200  HGB 11.6*  --   HCT 35.1*  --   PLT 47*  --   LABPROT  --  34.5*  INR  --  3.5*  CREATININE 2.07*  --   TROPONINIHS 63*  --     Estimated Creatinine Clearance: 31.3 mL/min (A) (by C-G formula based on SCr of 2.07 mg/dL (H)).   Medical History: Past Medical History:  Diagnosis Date   Anticoagulants causing adverse effect in therapeutic use    Anxiety    Paxil seemed to cause odd neuro side effects; better on prozac as of 09/2015   Aortic regurgitation 04/2007 surgery   Resolved [facility]R at [facility]   Cervical spondylosis    ESI by [name] in [place]. 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 (most recent 07/2017)   History of prostate cancer 2003   Rad prost   History of pulmonary  embolism 2011; 10/2014   Recurrence off of anticoagulation 10/2014: per hem/onc ([name]) pt needs lifelong anticoagulation (hypercoag w/u neg).   Hyperlipidemia    statin-intolerant, except for crestor low dose.   Hypertension    Migraine syndrome    Mitral regurgitation    PAF (paroxysmal atrial fibrillation) (HCC)    Panic attacks    "           "             "                  "                  "                       "                             "                          Pulmonary nodule, right 2015   RLL: resected at [facility]; Benign RLL nodule (fungal and AFB stains neg).  Plan is for  [facility] thoracic surg to do f/u o/v & CT chest 1 yr    Right-sided headache 07/2016   Primary stabbing HA or migraine per neuro (Dr. Tomi Likens).  Gabapentin and topamax not tolerated.  These resolved spontaneously.    Medications:  PTA Warfarin  Assessment: 76 yr male with CLL undergoing chemotherapy.  PMH significant for bioprosthetic AVR, CKD, recurent PE, PAF Patient lethargic with altered mental status Med history documented patient states takes warfarin occasionally and is unsure of how was supposed to be taking this medication. Stated he took Warfarin '15mg'$  on Fri and Sat, then '10mg'$  on Mon, Wed and Thur Patient on vancomycin, Cefepime and Metronidazole for febrile neutropenia. Known drug interaction between metronidazole and warfarin which may result in increased risk of bleeding.  Will need to closely monitor INR levels while on antibiotics. INR = 3.6 (supratherapeutic) on admission   Goal of Therapy:  INR 2-3    Plan:  No additional warfarin tonight due to supratherapeutic INR Daily PT/INR F/U PTA warfarin regimen in the AM  Junia Nygren, [place], PharmD 06/14/2022,2:41 AM

## 2022-06-14 NOTE — Progress Notes (Signed)
Initial Nutrition Assessment  DOCUMENTATION CODES:   Not applicable  INTERVENTION:   -Ensure Enlive po TID, each supplement provides 350 kcal and 20 grams of protein -MVI with minerals daily  NUTRITION DIAGNOSIS:   Inadequate oral intake related to decreased appetite as evidenced by per patient/family report.  GOAL:   Patient will meet greater than or equal to 90% of their needs  MONITOR:   PO intake, Supplement acceptance  REASON FOR ASSESSMENT:   Consult Poor PO  ASSESSMENT:   Pt with PMH of prostate cancer s/p prostatectomy, CLL, bioprosthetic AVR, recurrent PE on Coumadin, PAF and CKD-3B brought after found down on his knees and hands by his friend.  Pt admitted with AMS.   8/4- s/p bone marrow biopsy  Reviewed I/O's: +2.4 L x 24 hours  UOP: 200 ml x 24 hours  Spoke with pt's niece over the phone. She shares that pt just ordered a chicken salad sandwich for lunch. Per pt, he had decreased appetite over the past week, still eating 3 meals per day, but significantly less. Pt shares that he is hungry now.   Reviewed wt hx; pt has experienced a 5.6% wt loss over the past month, which is significant for time frame.    Discussed importance of good meal and supplement intake to promote healing. Pt amenable for Ensure and was thinking about drinking these PTA.   Medications reviewed and include lactated ringers @ 50 ml/hr.   Labs reviewed: CBGS: 106.   Diet Order:   Diet Order             Diet regular Room service appropriate? Yes; Fluid consistency: Thin  Diet effective now                   EDUCATION NEEDS:   Education needs have been addressed  Skin:  Skin Assessment: Reviewed RN Assessment  Last BM:  06/12/22  Height:   Ht Readings from Last 1 Encounters:  06/14/22 _0  (1.778 m)    Weight:   Wt Readings from Last 1 Encounters:  06/14/22 77.1 kg    Ideal Body Weight:  75.5 kg  BMI:  Body mass index is 24.39 kg/m.  Estimated  Nutritional Needs:   Kcal:  2100-2300  Protein:  100-115 grams  Fluid:  > 2 L    Loistine Chance, RD, LDN, [place] Registered Dietitian II Certified Diabetes Care and Education Specialist Please refer to [facility] for RD and/or RD on-call/weekend/after hours pager

## 2022-06-14 NOTE — Progress Notes (Signed)
MRI scans ordered on patient with shrapnel in right shoulder, back of head, bilat tib/fibs, & chest. Radiologist (Dr Lennon Alstrom) is only concerned about the metal close to lung (best seen on CT Chest from 02/09/2022, Series 2, Image 27). Dr. Lennon Alstrom is only okay proceeding with MRI if patient is A&O enough to squeeze the emergency ball if he feels anything uncomfortable in that area during the scan.

## 2022-06-14 NOTE — Assessment & Plan Note (Addendum)
.    The patient has a history of neutropenia.  Does not appear he has had a round of chemotherapy recently.  Oncology to see him in the morning.  Continue IV cefepime vancomycin and Flagyl.

## 2022-06-14 NOTE — Progress Notes (Signed)
Echocardiogram 2D Echocardiogram has been performed.  Oneal Deputy Janavia Rottman RDCS 06/14/2022, 2:03 PM

## 2022-06-14 NOTE — Assessment & Plan Note (Signed)
Hold all antiplatelets, Lovenox/heparin.  SCDs for prophylaxis.

## 2022-06-14 NOTE — Assessment & Plan Note (Signed)
Observation telemetry bed.  Unclear the cause of the patient's mentation.  He is alert and orient x3 however he is very to slow to answer questions.

## 2022-06-14 NOTE — H&P (Signed)
History and Physical    [name] WIO:[id number] DOB: 1946-09-07 DOA: 06/13/2022  DOS: the patient was seen and examined on 06/13/2022  PCP: [name], MD   Patient coming from: Home  I have personally briefly reviewed patient's old medical records in [place]  Chief complaint: Lethargy, weakness. History present illness: 76 year old African-American male history of CLL, history of bioprosthetic aortic valve replacement, CKD stage IIIb, history of recurrent PE on Coumadin, history of paroxysmal atrial fibrillation, presents to the ER today with his niece Ruby.  Patient is a poor historian unable to give a accurate history.  Ruby states that she was called by the patient's friend.  She lives [place] of [place].  Apparently patient's friend came over to the patient's house.  Patient was found on the ground in his living room.  Patient states that he wanted to lay down.  Instead of laying down on his couch or sitting in a chair, patient decided to lay on the ground.  He does not know why.  Patient states he has been feeling fatigued for several weeks now.  He states that he did not contact [name] because he was on vacation.  Patient brought to the ER by his knees.  On arrival temp 99.6, heart rate 87, blood pressure 120/54.  Room air saturations of 97%.  Labs showed a white count of 0.5 with an [place] of 200.  Hemoglobin 11.6, platelets 47  Chest x-ray negative for pneumonia.  CT head showed no acute intracranial findings.  [facility] hospitalist contacted for admission.   ED Course: CBC shows pancytopenia.  Chest x-ray negative for pneumonia.  Review of Systems:  Review of Systems  Constitutional:  Positive for malaise/fatigue.  HENT: Negative.    Eyes: Negative.   Respiratory: Negative.    Cardiovascular: Negative.   Gastrointestinal: Negative.   Genitourinary: Negative.   Musculoskeletal: Negative.   Skin: Negative.   Neurological: Negative.    Endo/Heme/Allergies: Negative.        Decreased po intake per niece  Psychiatric/Behavioral: Negative.    All other systems reviewed and are negative.   Past Medical History:  Diagnosis Date   Anticoagulants causing adverse effect in therapeutic use    Anxiety    Paxil seemed to cause odd neuro side effects; better on prozac as of 09/2015   Aortic regurgitation 04/2007 surgery   Resolved with [facility]R at [facility]   Cervical spondylosis    ESI by [name] in [place]. 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 (most recent 07/2017)   History of prostate cancer 2003   Rad prost   History of pulmonary embolism 2011; 10/2014   Recurrence off of anticoagulation 10/2014: per hem/onc ([name]) pt needs lifelong anticoagulation (hypercoag w/u neg).   Hyperlipidemia    statin-intolerant, except for crestor low dose.   Hypertension    Migraine syndrome    Mitral regurgitation    PAF (paroxysmal atrial fibrillation) (HCC)    Panic attacks    "           "             "                  "                  "                       "                             "  Pulmonary nodule, right 2015   RLL: resected at [facility] --VATS; Benign RLL nodule (fungal and AFB stains neg).  Plan is for [facility] thoracic surg to do f/u o/v & CT chest 1 yr    Right-sided headache 07/2016   Primary stabbing HA or migraine per neuro (Dr. Tomi Likens).  Gabapentin and topamax not tolerated.  These resolved spontaneously.    Past Surgical History:  Procedure Laterality Date   AORTIC VALVE REPLACEMENT  2011   [place]  (bioprosthetic)   CARDIAC CATHETERIZATION  04/2010   Normal coronaries.   Carotid dopplers  08/2015   NORMAL   CATARACT EXTRACTION     L 12/06/09   R 09/14/09   COLONOSCOPY  01/26/13   tic's, o/w normal.  ([facility])--recall 10 yrs.   PENILE PROSTHESIS IMPLANT     PROSTATECTOMY  04/2002   for prostate cancer    Pulmonary nodule resection  Fall/winter 2016   [facility]   SHOULDER SURGERY     rotator cuff on both arms    TEE WITHOUT CARDIOVERSION N/A 07/13/2021   Procedure: TRANSESOPHAGEAL ECHOCARDIOGRAM (TEE);  Surgeon: Pixie Casino, MD;  Location: [facility] ENDOSCOPY;  Service: Cardiovascular;  Laterality: N/A;   TIBIALIS TENDON TRANSFER / REPAIR Right 03/02/2019   Procedure: RECONSTRUCTION OF RIGHT  ANTERIOR TIBIALIS TENDON;  Surgeon: Erle Crocker, MD;  Location: [facility];  Service: Orthopedics;  Laterality: Right;   TRANSTHORACIC ECHOCARDIOGRAM  09/20/2014; 03/2016; 03/2017   2015-mild LVH, EF 50-55%, wall motion nl, grade I diast dysfxn, prosth aort valve good,transaortic gradients decreased compared to echo 11/2013.   03/2016: EF 55-60%, normal LV function, severe LVH, normal diast fxn, mild increase in AV gradient compared to 09/2014 echo.  2018: EF 55-60%, normal LV fxn, grd II DD, AV stable/gradient's stable.     reports that he quit smoking about 41 years ago. His smoking use included cigarettes. He started smoking about 53 years ago. He has a 12.00 pack-year smoking history. He has never used smokeless tobacco. He reports that he does not currently use alcohol. He reports that he does not use drugs.  Allergies  Allergen Reactions   Hydrocodone-Acetaminophen Shortness Of Breath   Oxycodone Hcl Shortness Of Breath   Telmisartan-Hctz Shortness Of Breath, Palpitations and Other (See Comments)    Dizziness also   Aspirin Rash and Other (See Comments)    Confusion, also- Cannot take high doses   Atorvastatin Other (See Comments)    Myalgia    Buspirone Hcl Other (See Comments)    Headaches    Codeine Hives, Nausea And Vomiting and Other (See Comments)    Confusion also   Morphine Sulfate Itching   Paroxetine Other (See Comments)    Paresthesias: R arm, R leg, r side of face   Rabeprazole Other (See Comments)    headache   Ramipril Other (See Comments)    Unknown reaction     Family History  Problem Relation Age of Onset   Cancer Sister        uterine   Colon cancer Sister 61   Stroke Mother    Prostate cancer Father    Stroke Sister    Stroke Brother    Heart attack Neg Hx     Prior to Admission medications   Medication Sig Start Date End Date Taking? Authorizing Provider  acyclovir (ZOVIRAX) 400 MG tablet TAKE 1 TABLET BY MOUTH EVERY DAY Patient taking differently: Take 400 mg by mouth daily. 04/30/22  Yes Volanda Napoleon, MD  Cholecalciferol (VITAMIN D3) 50 MCG (2000 UT) capsule Take 2,000 Units by mouth every morning.   Yes [provider]  [employer] MOTION SICKNESS RELIEF 25 MG CHEW CHEW 1 TABLET EVERY DAY AS NEEDED FOR DIZZINESS 01/18/22  Yes [name], MD  docusate sodium (COLACE) 100 MG capsule Take 100 mg by mouth daily.   Yes [provider]  ipratropium (ATROVENT) 0.03 % nasal spray Place 2 sprays into both nostrils every 12 (twelve) hours. Patient taking differently: Place 2 sprays into both nostrils 2 (two) times daily as needed for rhinitis (congestion). 10/04/20  Yes Inda Coke, PA  LORazepam (ATIVAN) 0.5 MG tablet Take 1 tablet (0.5 mg total) by mouth at bedtime as needed for anxiety (can take 1/4 or 1/2 tablet  if effective.). 12/20/21  Yes [name], MD  omeprazole (PRILOSEC) 40 MG capsule Take 40 mg by mouth every evening. 10/18/20  Yes [provider]  ondansetron (ZOFRAN) 8 MG tablet Take 1 tablet (8 mg total) by mouth 2 (two) times daily as needed for refractory nausea / vomiting. Start on day 2 after bendamustine chemo. 01/03/22  Yes Volanda Napoleon, MD  prochlorperazine (COMPAZINE) 10 MG tablet Take 1 tablet (10 mg total) by mouth every 6 (six) hours as needed (Nausea or vomiting). 01/03/22  Yes Volanda Napoleon, MD  rosuvastatin (CRESTOR) 5 MG tablet Take 5 mg by mouth daily.   Yes [provider]  sertraline (ZOLOFT) 100 MG tablet Take 100 mg by mouth every morning.   Yes [provider]  sodium chloride (OCEAN) 0.65 % SOLN nasal spray Place 1 spray into both nostrils as needed for congestion.   Yes [provider]  warfarin (COUMADIN) 5 MG tablet TAKE AS DIRECTED BY COUMADIN CLINIC 09/17/21  Yes Josue Hector, MD  cefdinir (OMNICEF) 300 MG capsule Take 2 capsules (600 mg total) by mouth daily. Patient not taking: Reported on 06/14/2022 05/31/22   Volanda Napoleon, MD    Physical Exam: Vitals:   06/14/22 0023 06/14/22 0030 06/14/22 0113 06/14/22 0130  BP:  103/62 108/60 (!) 106/56  Pulse:  (!) 52 (!) 118 (!) 53  Resp:  (!) 23 (!) 22 19  Temp: 98.2 F (36.8 C)     TempSrc: Oral     SpO2:  94% 92% 93%  Weight:      Height:        Physical Exam Vitals and nursing note reviewed.  Constitutional:      General: He is not in acute distress.    Appearance: He is ill-appearing. He is not toxic-appearing or diaphoretic.     Comments: Chronically ill appearing  HENT:     Head: Normocephalic and atraumatic.     Nose: Nose normal.  Eyes:     General: No scleral icterus. Cardiovascular:     Rate and Rhythm: Regular rhythm. Bradycardia present.     Heart sounds: Murmur heard.  Pulmonary:     Effort: Pulmonary effort is normal. No respiratory distress.     Breath sounds: Normal breath sounds. No wheezing or rales.  Abdominal:     General: Abdomen is flat. Bowel sounds are normal. There is no distension.     Tenderness: There is no abdominal tenderness. There is no guarding.  Musculoskeletal:     Right lower leg: No edema.     Left lower leg: No edema.  Skin:    General: Skin is warm and dry.     Capillary Refill:  Capillary refill takes less than 2 seconds.  Neurological:     General: No focal deficit present.     Mental Status: He is alert and oriented to person, place, and time.     Comments: Slow to answer questions.      Labs on Admission: I have personally reviewed following labs and imaging studies  CBC: Recent Labs  Lab  06/13/22 2100  WBC 0.5*  NEUTROABS 0.2*  HGB 11.6*  HCT 35.1*  MCV 96.7  PLT 47*   Basic Metabolic Panel: Recent Labs  Lab 06/13/22 2100  NA 139  K 4.1  CL 107  CO2 24  GLUCOSE 100*  BUN 41*  CREATININE 2.07*  CALCIUM 8.8*   GFR: Estimated Creatinine Clearance: 31.3 mL/min (A) (by C-G formula based on SCr of 2.07 mg/dL (H)). Liver Function Tests: Recent Labs  Lab 06/13/22 2100  AST 85*  ALT 46*  ALKPHOS 157*  BILITOT 0.5  PROT 6.1*  ALBUMIN 3.3*   Recent Labs  Lab 06/13/22 2100  LIPASE 69*   Recent Labs  Lab 06/13/22 2100  AMMONIA 14   Coagulation Profile: Recent Labs  Lab 06/13/22 2200  INR 3.5*   Cardiac Enzymes: Recent Labs  Lab 06/13/22 2100  TROPONINIHS 63*   BNP (last 3 results) No results for input(s): "PROBNP" in the last 8760 hours. HbA1C: No results for input(s): "HGBA1C" in the last 72 hours. CBG: Recent Labs  Lab 06/13/22 1949  GLUCAP 106*   Lipid Profile: No results for input(s): "CHOL", "HDL", "LDLCALC", "TRIG", "CHOLHDL", "LDLDIRECT" in the last 72 hours. Thyroid Function Tests: Recent Labs    06/13/22 2100  TSH 2.465   Anemia Panel: No results for input(s): "VITAMINB12", "FOLATE", "FERRITIN", "TIBC", "IRON", "RETICCTPCT" in the last 72 hours. Urine analysis:    Component Value Date/Time   COLORURINE YELLOW 06/13/2022 2300   APPEARANCEUR HAZY (A) 06/13/2022 2300   LABSPEC 1.017 06/13/2022 2300   PHURINE 5.0 06/13/2022 2300   GLUCOSEU NEGATIVE 06/13/2022 2300   GLUCOSEU NEGATIVE 08/11/2017 0843   HGBUR MODERATE (A) 06/13/2022 2300   HGBUR negative 03/20/2010 0843   BILIRUBINUR NEGATIVE 06/13/2022 2300   BILIRUBINUR n 03/22/2011 0000   KETONESUR NEGATIVE 06/13/2022 2300   PROTEINUR 100 (A) 06/13/2022 2300   UROBILINOGEN 0.2 08/11/2017 0843   NITRITE NEGATIVE 06/13/2022 2300   LEUKOCYTESUR NEGATIVE 06/13/2022 2300    Radiological Exams on Admission: I have personally reviewed images DG Chest 2 View  Result  Date: 06/13/2022 CLINICAL DATA:  Altered mental status and weakness for several days, initial encounter EXAM: CHEST - 2 VIEW COMPARISON:  05/31/2022 FINDINGS: Cardiac shadow is within limits. Postsurgical changes are again seen. Aortic calcifications are noted. Lungs are clear. No bony abnormality is noted. IMPRESSION: No active cardiopulmonary disease. Electronically Signed   By: Inez Catalina M.D.   On: 06/13/2022 20:48   CT HEAD WO CONTRAST (5MM)  Result Date: 06/13/2022 CLINICAL DATA:  Dizziness EXAM: CT HEAD WITHOUT CONTRAST TECHNIQUE: Contiguous axial images were obtained from the base of the skull through the vertex without intravenous contrast. RADIATION DOSE REDUCTION: This exam was performed according to the departmental dose-optimization program which includes automated exposure control, adjustment of the mA and/or kV according to patient size and/or use of iterative reconstruction technique. COMPARISON:  11/07/2021 FINDINGS: Brain: No acute intracranial findings are seen. There are no signs of bleeding within the cranium. Cortical sulci are prominent. There is decreased density in periventricular and subcortical white matter. Calcifications are seen in basal  ganglia on both sides. Vascular: Scattered arterial calcifications are seen. Skull: No fracture is seen. Sinuses/Orbits: There are no air-fluid levels in paranasal sinuses. There is partial opacification of left side of sphenoid sinus. There is minimal mucosal thickening in ethmoid and maxillary sinuses. Other: No significant interval changes are noted. IMPRESSION: No acute intracranial findings are seen in noncontrast CT brain. Atrophy. Small-vessel disease. Chronic sinusitis. Electronically Signed   By: Elmer Picker M.D.   On: 06/13/2022 20:13    EKG: My personal interpretation of EKG shows: NSR, PAC    Assessment/Plan Principal Problem:   Altered mental status Active Problems:   Neutropenia (HCC)   Acute renal failure  superimposed on stage 3b chronic kidney disease ([place])   Chronic lymphocytic leukemia (HCC)   S/P Bioprosthetic AVR (aortic valve replacement) in 2011   History of pulmonary embolism   Pancytopenia (HCC)    Assessment and Plan: * Altered mental status Observation telemetry bed.  Unclear the cause of the patient's mentation.  He is alert and orient x3 however he is very to slow to answer questions.  Acute renal failure superimposed on stage 3b chronic kidney disease ([place]) Baseline creatinine is approximately 1.3.  Continue with IV fluids.  Avoid nephrotoxic agents.  Neutropenia ([place]) .  The patient has a history of neutropenia.  Does not appear he has had a round of chemotherapy recently.  Oncology to see him in the morning.  Continue IV cefepime vancomycin and Flagyl.  History of pulmonary embolism Continue with Coumadin.  S/P Bioprosthetic AVR (aortic valve replacement) in 2011 Stable.  Chronic lymphocytic leukemia (HCC) Chronic.  Followed by [name].  Pancytopenia ([place]) Hold all antiplatelets, Lovenox/heparin.  SCDs for prophylaxis.    DVT prophylaxis: SCDs Code Status: Full Code Family Communication: discussed with pt and niece ruby at bedside  Disposition Plan: return home  Consults called: EDP has consulted oncology  Admission status: Observation, Telemetry bed   Kristopher Oppenheim, DO [facility] 06/14/2022, 2:18 AM

## 2022-06-14 NOTE — Assessment & Plan Note (Signed)
Chronic.  Followed by [name].

## 2022-06-14 NOTE — Progress Notes (Signed)
Referring Physician(s): Ennever,P  Supervising Physician: Markus Daft  Patient Status:  [facility] - In-pt  Chief Complaint: Lethargy, altered mental status, CLL   Subjective: Patient known to IR service from bone marrow biopsy in 2022 and 11/14/21 as well as left axillary lymph node biopsy on 12/04/2021.  He has a past medical history significant for anxiety, aortic regurgitation, cervical spondylosis, prostate cancer, PE, hyperlipidemia, hypertension, migraine, paroxysmal atrial fibrillation, CKD, panic attacks, mechanical heart valve on Coumadin as well as chronic lymphocytic leukemia.  He was admitted to [facility] on 8/3 with lethargy, weakness, altered mental status, dehydration and findings of persistent neutropenia/thrombocytopenia, elevated LFTs.  He has not had any treatment of his CLL for approximately 2 months.  Request now received from oncology for CT-guided bone marrow biopsy for further evaluation.  Patient currently denies fever, headache, chest pain, worsening dyspnea, abdominal/back pain, nausea/vomiting or bleeding.  He does remain weak and has some slow processing mentally.  Past Medical History:  Diagnosis Date   Anticoagulants causing adverse effect in therapeutic use    Anxiety    Paxil seemed to cause odd neuro side effects; better on prozac as of 09/2015   Aortic regurgitation 04/2007 surgery   Resolved with tissue AVR at [facility]   Cervical spondylosis    ESI by [name] in [place]. 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 (most recent 07/2017)   History of prostate cancer 2003   Rad prost   History of pulmonary embolism 2011; 10/2014   Recurrence off of anticoagulation 10/2014: per hem/onc ([name]) pt needs lifelong anticoagulation (hypercoag w/u neg).   Hyperlipidemia    statin-intolerant, except for crestor low dose.   Hypertension    Migraine syndrome    Mitral  regurgitation    PAF (paroxysmal atrial fibrillation) (HCC)    Panic attacks    "           "             "                  "                  "                       "                             "                          Pulmonary nodule, right 2015   RLL: resected at [facility] --VATS; Benign RLL nodule (fungal and AFB stains neg).  Plan is for [facility] thoracic surg to do f/u o/v & CT chest 1 yr    Right-sided headache 07/2016   Primary stabbing HA or migraine per neuro (Dr. Tomi Likens).  Gabapentin and topamax not tolerated.  These resolved spontaneously.   Past Surgical History:  Procedure Laterality Date   AORTIC VALVE REPLACEMENT  2011   [facility]  (bioprosthetic)   CARDIAC CATHETERIZATION  04/2010   Normal coronaries.   Carotid dopplers  08/2015   NORMAL   CATARACT EXTRACTION     L 12/06/09   R 09/14/09   COLONOSCOPY  01/26/13   tic's, o/w normal.  (  [facility])--recall 10 yrs.   PENILE PROSTHESIS IMPLANT     PROSTATECTOMY  04/2002   for prostate cancer   Pulmonary nodule resection  Fall/winter 2016   [facility]   SHOULDER SURGERY     rotator cuff on both arms    TEE WITHOUT CARDIOVERSION N/A 07/13/2021   Procedure: TRANSESOPHAGEAL ECHOCARDIOGRAM (TEE);  Surgeon: Pixie Casino, MD;  Location: [facility] ENDOSCOPY;  Service: Cardiovascular;  Laterality: N/A;   TIBIALIS TENDON TRANSFER / REPAIR Right 03/02/2019   Procedure: RECONSTRUCTION OF RIGHT  ANTERIOR TIBIALIS TENDON;  Surgeon: Erle Crocker, MD;  Location: [place];  Service: Orthopedics;  Laterality: Right;   TRANSTHORACIC ECHOCARDIOGRAM  09/20/2014; 03/2016; 03/2017   2015-mild LVH, EF 50-55%, wall motion nl, grade I diast dysfxn, prosth aort valve good,transaortic gradients decreased compared to echo 11/2013.   03/2016: EF 55-60%, normal LV function, severe LVH, normal diast fxn, mild increase in AV gradient compared to 09/2014 echo.  2018: EF 55-60%, normal LV fxn, grd II DD, AV stable/gradient's stable.       Allergies: Hydrocodone-acetaminophen, Oxycodone hcl, Telmisartan-hctz, Aspirin, Atorvastatin, Buspirone hcl, Codeine, Morphine sulfate, Paroxetine, Rabeprazole, and Ramipril  Medications: Prior to Admission medications   Medication Sig Start Date End Date Taking? Authorizing Provider  acyclovir (ZOVIRAX) 400 MG tablet TAKE 1 TABLET BY MOUTH EVERY DAY Patient taking differently: Take 400 mg by mouth daily. 04/30/22  Yes Volanda Napoleon, MD  Cholecalciferol (VITAMIN D3) 50 MCG (2000 UT) capsule Take 2,000 Units by mouth every morning.   Yes [provider]  [employer] MOTION SICKNESS RELIEF 25 MG CHEW CHEW 1 TABLET EVERY DAY AS NEEDED FOR DIZZINESS 01/18/22  Yes [name], MD  docusate sodium (COLACE) 100 MG capsule Take 100 mg by mouth daily.   Yes [provider]  ipratropium (ATROVENT) 0.03 % nasal spray Place 2 sprays into both nostrils every 12 (twelve) hours. Patient taking differently: Place 2 sprays into both nostrils 2 (two) times daily as needed for rhinitis (congestion). 10/04/20  Yes Inda Coke, PA  LORazepam (ATIVAN) 0.5 MG tablet Take 1 tablet (0.5 mg total) by mouth at bedtime as needed for anxiety (can take 1/4 or 1/2 tablet  if effective.). 12/20/21  Yes [name], MD  omeprazole (PRILOSEC) 40 MG capsule Take 40 mg by mouth every evening. 10/18/20  Yes [provider]  ondansetron (ZOFRAN) 8 MG tablet Take 1 tablet (8 mg total) by mouth 2 (two) times daily as needed for refractory nausea / vomiting. Start on day 2 after bendamustine chemo. 01/03/22  Yes Volanda Napoleon, MD  prochlorperazine (COMPAZINE) 10 MG tablet Take 1 tablet (10 mg total) by mouth every 6 (six) hours as needed (Nausea or vomiting). 01/03/22  Yes Volanda Napoleon, MD  rosuvastatin (CRESTOR) 5 MG tablet Take 5 mg by mouth daily.   Yes [provider]  sertraline (ZOLOFT) 100 MG tablet Take 100 mg by mouth every morning.   Yes [provider]  sodium  chloride (OCEAN) 0.65 % SOLN nasal spray Place 1 spray into both nostrils as needed for congestion.   Yes [provider]  warfarin (COUMADIN) 5 MG tablet TAKE AS DIRECTED BY COUMADIN CLINIC 09/17/21  Yes Josue Hector, MD  cefdinir (OMNICEF) 300 MG capsule Take 2 capsules (600 mg total) by mouth daily. Patient not taking: Reported on 06/14/2022 05/31/22   Volanda Napoleon, MD     Vital Signs: BP (!) 93/58 (BP Location: Right Arm)  Pulse 77   Temp 98.7 F (37.1 C)   Resp (!) 22   Ht _0  (1.778 m)   Wt 169 lb 15.6 oz (77.1 kg)   SpO2 96%   BMI 24.39 kg/m   Physical Exam patient awake, answering some simple questions okay slowed mentally; chest clear to auscultation bilaterally.  Heart with normal rate, occasional ectopy,+murmur.  Abdomen soft, positive bowel sounds, nontender.  No significant lower extremity edema  Imaging: US Abdomen Complete  Result Date: 06/14/2022 CLINICAL DATA:  History of chronic lymphocytic leukemia. EXAM: ABDOMEN ULTRASOUND COMPLETE COMPARISON:  CT scan of February 09, 2022. FINDINGS: Gallbladder: No gallstones or wall thickening visualized. No sonographic Murphy sign noted by sonographer. Common bile duct: Diameter: 4 mm which is within normal limits. Liver: 8 mm rounded hypoechoic abnormality is noted in right hepatic lobe which is not diagnostic for a cyst based on ultrasound criteria. Within normal limits in parenchymal echogenicity. Portal vein is patent on color Doppler imaging with normal direction of blood flow towards the liver. IVC: No abnormality visualized. Pancreas: Visualized portion unremarkable. Spleen: Measures 14.9 x 12.2 x 6.8 cm with calculated volume of 644 cc, consistent with mild splenomegaly. No focal abnormality is noted. Right Kidney: Length: 12.8 cm. Two cysts are noted, the largest measuring 2.9 cm. Echogenicity within normal limits. No mass or hydronephrosis visualized. Left Kidney: Length: 12.1 cm. 2.3 cm simple cyst is seen in lower  pole. Echogenicity within normal limits. No mass or hydronephrosis visualized. Abdominal aorta: No aneurysm visualized. Other findings: None. IMPRESSION: 8 mm rounded hypoechoic abnormality noted in right hepatic lobe which is not diagnostic for cyst based on ultrasound criteria. Neoplasm or metastatic disease cannot be excluded. When the patient is clinically stable and able to follow directions and hold their breath (preferably as an outpatient) further evaluation with dedicated abdominal MRI should be considered. Mild splenomegaly is noted. Electronically Signed   By: Marijo Conception M.D.   On: 06/14/2022 08:43   DG Chest 2 View  Result Date: 06/13/2022 CLINICAL DATA:  Altered mental status and weakness for several days, initial encounter EXAM: CHEST - 2 VIEW COMPARISON:  05/31/2022 FINDINGS: Cardiac shadow is within limits. Postsurgical changes are again seen. Aortic calcifications are noted. Lungs are clear. No bony abnormality is noted. IMPRESSION: No active cardiopulmonary disease. Electronically Signed   By: Inez Catalina M.D.   On: 06/13/2022 20:48   CT HEAD WO CONTRAST (5MM)  Result Date: 06/13/2022 CLINICAL DATA:  Dizziness EXAM: CT HEAD WITHOUT CONTRAST TECHNIQUE: Contiguous axial images were obtained from the base of the skull through the vertex without intravenous contrast. RADIATION DOSE REDUCTION: This exam was performed according to the departmental dose-optimization program which includes automated exposure control, adjustment of the mA and/or kV according to patient size and/or use of iterative reconstruction technique. COMPARISON:  11/07/2021 FINDINGS: Brain: No acute intracranial findings are seen. There are no signs of bleeding within the cranium. Cortical sulci are prominent. There is decreased density in periventricular and subcortical white matter. Calcifications are seen in basal ganglia on both sides. Vascular: Scattered arterial calcifications are seen. Skull: No fracture is seen.  Sinuses/Orbits: There are no air-fluid levels in paranasal sinuses. There is partial opacification of left side of sphenoid sinus. There is minimal mucosal thickening in ethmoid and maxillary sinuses. Other: No significant interval changes are noted. IMPRESSION: No acute intracranial findings are seen in noncontrast CT brain. Atrophy. Small-vessel disease. Chronic sinusitis. Electronically Signed   By: Prudy Feeler.D.  On: 06/13/2022 20:13    Labs:  CBC: Recent Labs    05/08/22 1007 05/22/22 0901 05/31/22 1309 06/13/22 2100  WBC 2.0* 1.5* 3.7* 0.5*  HGB 12.5* 11.1* 11.3* 11.6*  HCT 37.5* 34.5* 34.7* 35.1*  PLT 96* 88* 97* 47*    COAGS: Recent Labs    09/08/21 1445 09/10/21 1145 04/30/22 1112 05/17/22 1349 06/13/22 2200 06/14/22 0531  INR 1.4*   < > 1.8* 2.6 3.5* 4.2*  APTT 39*  --   --   --   --   --    < > = values in this interval not displayed.    BMP: Recent Labs    04/10/22 1049 05/08/22 1007 05/22/22 0901 06/13/22 2100  NA 140 139 139 139  K 4.9 4.2 4.4 4.1  CL 105 104 104 107  CO2 _0 GLUCOSE 93 90 78 100*  BUN 24* 27* 32* 41*  CALCIUM 9.5 9.5 9.3 8.8*  CREATININE 1.27* 1.28* 1.53* 2.07*  GFRNONAA 59* 58* 47* 33*    LIVER FUNCTION TESTS: Recent Labs    04/10/22 1049 05/08/22 1007 05/22/22 0901 06/13/22 2100  BILITOT 0.5 0.4 0.5 0.5  AST _1 85*  ALT _2 46*  ALKPHOS 81 71 82 157*  PROT 6.6 6.6 6.1* 6.1*  ALBUMIN 4.4 4.6 4.4 3.3*    Assessment and Plan: Patient known to IR service from bone marrow biopsy in 2022 and 11/14/21 as well as left axillary lymph node biopsy on 12/04/2021.  He has a past medical history significant for anxiety, aortic regurgitation, cervical spondylosis, prostate cancer, PE, hyperlipidemia, hypertension, migraine, paroxysmal atrial fibrillation, CKD, panic attacks, mechanical heart valve on Coumadin as well as chronic lymphocytic leukemia.  He was admitted to [facility] on 8/3 with  lethargy, weakness, altered mental status, dehydration and findings of persistent neutropenia/thrombocytopenia, elevated LFTs.  He has not had any treatment of his CLL for approximately 2 months.  Request now received from oncology for CT-guided bone marrow biopsy for further evaluation.Risks and benefits of procedure was discussed with the patient and/or patient's family including, but not limited to bleeding, infection, damage to adjacent structures or low yield requiring additional tests.  All of the questions were answered and there is agreement to proceed.  Consent signed and in chart.  Procedure scheduled for today.    Electronically Signed: D. Rowe Robert, PA-C 06/14/2022, 8:55 AM   I spent a total of 20 minutes at the the patient's bedside AND on the patient's hospital floor or unit, greater than 50% of which was counseling/coordinating care for CT-guided bone marrow biopsy    Patient ID: [name], male   DOB: 01/13/1946, 76 y.o.   MRN: [id number]

## 2022-06-15 ENCOUNTER — Encounter (HOSPITAL_COMMUNITY): Payer: Self-pay | Admitting: Internal Medicine

## 2022-06-15 DIAGNOSIS — K769 Liver disease, unspecified: Secondary | ICD-10-CM | POA: Diagnosis not present

## 2022-06-15 DIAGNOSIS — I350 Nonrheumatic aortic (valve) stenosis: Secondary | ICD-10-CM | POA: Diagnosis not present

## 2022-06-15 DIAGNOSIS — N179 Acute kidney failure, unspecified: Secondary | ICD-10-CM | POA: Diagnosis not present

## 2022-06-15 DIAGNOSIS — R251 Tremor, unspecified: Secondary | ICD-10-CM

## 2022-06-15 DIAGNOSIS — C9110 Chronic lymphocytic leukemia of B-cell type not having achieved remission: Secondary | ICD-10-CM | POA: Diagnosis not present

## 2022-06-15 DIAGNOSIS — I951 Orthostatic hypotension: Secondary | ICD-10-CM

## 2022-06-15 DIAGNOSIS — Z86711 Personal history of pulmonary embolism: Secondary | ICD-10-CM | POA: Diagnosis not present

## 2022-06-15 DIAGNOSIS — R569 Unspecified convulsions: Secondary | ICD-10-CM

## 2022-06-15 DIAGNOSIS — R5383 Other fatigue: Secondary | ICD-10-CM | POA: Diagnosis not present

## 2022-06-15 DIAGNOSIS — R531 Weakness: Secondary | ICD-10-CM | POA: Diagnosis not present

## 2022-06-15 DIAGNOSIS — I5032 Chronic diastolic (congestive) heart failure: Secondary | ICD-10-CM

## 2022-06-15 DIAGNOSIS — D709 Neutropenia, unspecified: Secondary | ICD-10-CM | POA: Diagnosis not present

## 2022-06-15 LAB — GLUCOSE, CAPILLARY: Glucose-Capillary: 133 mg/dL — ABNORMAL HIGH (ref 70–99)

## 2022-06-15 LAB — CBC WITH DIFFERENTIAL/PLATELET
Abs Immature Granulocytes: 0.01 10*3/uL (ref 0.00–0.07)
Basophils Absolute: 0 10*3/uL (ref 0.0–0.1)
Basophils Relative: 3 %
Eosinophils Absolute: 0 10*3/uL (ref 0.0–0.5)
Eosinophils Relative: 0 %
HCT: 29.4 % — ABNORMAL LOW (ref 39.0–52.0)
Hemoglobin: 9.5 g/dL — ABNORMAL LOW (ref 13.0–17.0)
Immature Granulocytes: 1 %
Lymphocytes Relative: 74 %
Lymphs Abs: 0.7 10*3/uL (ref 0.7–4.0)
MCH: 31.6 pg (ref 26.0–34.0)
MCHC: 32.3 g/dL (ref 30.0–36.0)
MCV: 97.7 fL (ref 80.0–100.0)
Monocytes Absolute: 0 10*3/uL — ABNORMAL LOW (ref 0.1–1.0)
Monocytes Relative: 3 %
Neutro Abs: 0.2 10*3/uL — CL (ref 1.7–7.7)
Neutrophils Relative %: 19 %
Platelets: 35 10*3/uL — ABNORMAL LOW (ref 150–400)
RBC: 3.01 MIL/uL — ABNORMAL LOW (ref 4.22–5.81)
RDW: 14.2 % (ref 11.5–15.5)
WBC: 0.9 10*3/uL — CL (ref 4.0–10.5)
nRBC: 0 % (ref 0.0–0.2)

## 2022-06-15 LAB — COMPREHENSIVE METABOLIC PANEL
ALT: 37 U/L (ref 0–44)
AST: 70 U/L — ABNORMAL HIGH (ref 15–41)
Albumin: 2.7 g/dL — ABNORMAL LOW (ref 3.5–5.0)
Alkaline Phosphatase: 129 U/L — ABNORMAL HIGH (ref 38–126)
Anion gap: 6 (ref 5–15)
BUN: 33 mg/dL — ABNORMAL HIGH (ref 8–23)
CO2: 23 mmol/L (ref 22–32)
Calcium: 8.1 mg/dL — ABNORMAL LOW (ref 8.9–10.3)
Chloride: 110 mmol/L (ref 98–111)
Creatinine, Ser: 1.4 mg/dL — ABNORMAL HIGH (ref 0.61–1.24)
GFR, Estimated: 52 mL/min — ABNORMAL LOW (ref >60)
Glucose, Bld: 128 mg/dL — ABNORMAL HIGH (ref 70–99)
Potassium: 3.7 mmol/L (ref 3.5–5.1)
Sodium: 139 mmol/L (ref 135–145)
Total Bilirubin: 0.5 mg/dL (ref 0.3–1.2)
Total Protein: 4.9 g/dL — ABNORMAL LOW (ref 6.5–8.1)

## 2022-06-15 LAB — FERRITIN: Ferritin: >7500 ng/mL — ABNORMAL HIGH (ref 24–336)

## 2022-06-15 LAB — RETICULOCYTES
Immature Retic Fract: 11.9 % (ref 2.3–15.9)
RBC.: 2.99 MIL/uL — ABNORMAL LOW (ref 4.22–5.81)
Retic Count, Absolute: 17.9 10*3/uL — ABNORMAL LOW (ref 19.0–186.0)
Retic Ct Pct: 0.6 % (ref 0.4–3.1)

## 2022-06-15 LAB — IRON AND TIBC
Iron: 71 ug/dL (ref 45–182)
Saturation Ratios: 44 % — ABNORMAL HIGH (ref 17.9–39.5)
TIBC: 162 ug/dL — ABNORMAL LOW (ref 250–450)
UIBC: 91 ug/dL

## 2022-06-15 LAB — PROTIME-INR
INR: 3.7 — ABNORMAL HIGH (ref 0.8–1.2)
Prothrombin Time: 36.2 seconds — ABNORMAL HIGH (ref 11.4–15.2)

## 2022-06-15 LAB — VITAMIN B12: Vitamin B-12: 3216 pg/mL — ABNORMAL HIGH (ref 180–914)

## 2022-06-15 LAB — MAGNESIUM: Magnesium: 1.9 mg/dL (ref 1.7–2.4)

## 2022-06-15 LAB — FOLATE: Folate: 7.1 ng/mL (ref >5.9)

## 2022-06-15 LAB — CEA: CEA: 2.5 ng/mL (ref 0.0–4.7)

## 2022-06-15 LAB — PHOSPHORUS: Phosphorus: 3 mg/dL (ref 2.5–4.6)

## 2022-06-15 LAB — AFP TUMOR MARKER: AFP, Serum, Tumor Marker: <1.8 ng/mL (ref 0.0–8.4)

## 2022-06-15 LAB — TSH: TSH: 2.271 u[IU]/mL (ref 0.350–4.500)

## 2022-06-15 MED ORDER — SODIUM CHLORIDE 0.9 % IV SOLN: INTRAVENOUS | Status: DC

## 2022-06-15 MED ORDER — LACTATED RINGERS IV SOLN: INTRAVENOUS | Status: DC

## 2022-06-15 MED ORDER — TBO-FILGRASTIM 480 MCG/0.8ML ~~LOC~~ SOSY
480.0000 ug | PREFILLED_SYRINGE | Freq: Every day | SUBCUTANEOUS | Status: DC
  Administered 2022-06-15 – 2022-06-16 (×2): 480 ug via SUBCUTANEOUS
  Filled 2022-06-15 (×2): qty 0.8

## 2022-06-15 MED ORDER — MEGESTROL ACETATE 400 MG/10ML PO SUSP
400.0000 mg | Freq: Two times a day (BID) | ORAL | Status: DC
  Administered 2022-06-15 – 2022-06-20 (×11): 400 mg via ORAL
  Filled 2022-06-15 (×11): qty 10

## 2022-06-15 NOTE — Consult Note (Signed)
Cardiology Consultation:   Patient ID: [name]; [id number]; 06/24/1946   Admit date: 06/13/2022 Date of Consult: 06/15/2022  Primary Care Provider: [name], MD Primary Cardiologist: Jenkins Rouge, MD  Primary Electrophysiologist:  NA   Patient Profile:   [name] is a 76 y.o. male with a hx of AS who is being seen today for the evaluation of severe AS at the request of Dr. Cyndia Skeeters.  History of Present Illness:   [name]  has a history of bioprosthetic AV replacement.  He follows with Dr. [name] .  He underwent bioprosthetic AVR in July 2011 for severe AI at [facility].  In October 2020 he was noted to have increased gradient on the AVR with HALT/HAM involving the non coronary cusp.  This was on Xarelto and he was changed to warfarin.  We say him in Sept of last year with when he was admitted with fever and positive blood culture.  At that time there was no evidence of endocarditis.  TEE was done.  AS was severe at that time.   He has most recently been seen by Dr. [name] in June and was managed medically.  He has a history of PAF but is maintaining NSR.   We was brought to the ED by his niece.  He was found on the floor.   However he was apparently awake.  He says that he sometimes likes to sleep on the floor.  However, he cannot give an accurate history.  In the ED his vitals were unremarkable except for mildly elevated temp at 99.6.  There are no acute findings other than CBC demonstrating pancytopenia.   We are called because echo demonstrates severe AS.   He was started on broad spectrum antibiotics.  There is no localizing source of any infection.  He is undergoing bone marrow biopsy.  He has a small lesion on his right hepatic lobe noted on ultrasound and status post MRI of the liver and brain.  He has had transaminitis.  This is slowly resolving.  Of note his INR was supratherapeutic on admission.  He does not recall the events.   The patient denies any new symptoms such as  chest discomfort, neck or arm discomfort. There has been no new shortness of breath, PND or orthopnea. There have been no reported palpitations, presyncope or syncope.   He has been more fatigued but denies fevers or chills.      Past Medical History:  Diagnosis Date   Anticoagulants causing adverse effect in therapeutic use    Anxiety    Paxil seemed to cause odd neuro side effects; better on prozac as of 09/2015   Aortic regurgitation 04/2007 surgery   Resolved with tissue AVR at [facility]   Cervical spondylosis    ESI by [name] in [place]. 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 (most recent 07/2017)   History of prostate cancer 2003   Rad prost   History of pulmonary embolism 2011; 10/2014   Recurrence off of anticoagulation 10/2014: per hem/onc ([name]) pt needs lifelong anticoagulation (hypercoag w/u neg).   Hyperlipidemia    statin-intolerant, except for crestor low dose.   Hypertension    Migraine syndrome    Mitral regurgitation    PAF (paroxysmal atrial fibrillation) (HCC)    Panic attacks    "           "             "                  "                  "                       "                             "  Pulmonary nodule, right 2015   RLL: resected at [facility] --VATS; Benign RLL nodule (fungal and AFB stains neg).  Plan is for [facility] thoracic surg to do f/u o/v & CT chest 1 yr    Right-sided headache 07/2016   Primary stabbing HA or migraine per neuro (Dr. Tomi Likens).  Gabapentin and topamax not tolerated.  These resolved spontaneously.    Past Surgical History:  Procedure Laterality Date   AORTIC VALVE REPLACEMENT  2011   [facility]  (bioprosthetic)   CARDIAC CATHETERIZATION  04/2010   Normal coronaries.   Carotid dopplers  08/2015   NORMAL   CATARACT EXTRACTION     L 12/06/09   R 09/14/09   COLONOSCOPY  01/26/13   tic's, o/w normal.  ([facility])--recall 10 yrs.    PENILE PROSTHESIS IMPLANT     PROSTATECTOMY  04/2002   for prostate cancer   Pulmonary nodule resection  Fall/winter 2016   [facility]   SHOULDER SURGERY     rotator cuff on both arms    TEE WITHOUT CARDIOVERSION N/A 07/13/2021   Procedure: TRANSESOPHAGEAL ECHOCARDIOGRAM (TEE);  Surgeon: Pixie Casino, MD;  Location: [facility] ENDOSCOPY;  Service: Cardiovascular;  Laterality: N/A;   TIBIALIS TENDON TRANSFER / REPAIR Right 03/02/2019   Procedure: RECONSTRUCTION OF RIGHT  ANTERIOR TIBIALIS TENDON;  Surgeon: Erle Crocker, MD;  Location: [place];  Service: Orthopedics;  Laterality: Right;   TRANSTHORACIC ECHOCARDIOGRAM  09/20/2014; 03/2016; 03/2017   2015-mild LVH, EF 50-55%, wall motion nl, grade I diast dysfxn, prosth aort valve good,transaortic gradients decreased compared to echo 11/2013.   03/2016: EF 55-60%, normal LV function, severe LVH, normal diast fxn, mild increase in AV gradient compared to 09/2014 echo.  2018: EF 55-60%, normal LV fxn, grd II DD, AV stable/gradient's stable.     Home Medications:  Prior to Admission medications   Medication Sig Start Date End Date Taking? Authorizing Provider  acyclovir (ZOVIRAX) 400 MG tablet TAKE 1 TABLET BY MOUTH EVERY DAY Patient taking differently: Take 400 mg by mouth daily. 04/30/22  Yes Volanda Napoleon, MD  Cholecalciferol (VITAMIN D3) 50 MCG (2000 UT) capsule Take 2,000 Units by mouth every morning.   Yes [provider]  [employer] MOTION SICKNESS RELIEF 25 MG CHEW CHEW 1 TABLET EVERY DAY AS NEEDED FOR DIZZINESS 01/18/22  Yes [name], MD  docusate sodium (COLACE) 100 MG capsule Take 100 mg by mouth daily.   Yes [provider]  ipratropium (ATROVENT) 0.03 % nasal spray Place 2 sprays into both nostrils every 12 (twelve) hours. Patient taking differently: Place 2 sprays into both nostrils 2 (two) times daily as needed for rhinitis (congestion). 10/04/20  Yes Inda Coke, PA  LORazepam (ATIVAN) 0.5 MG tablet  Take 1 tablet (0.5 mg total) by mouth at bedtime as needed for anxiety (can take 1/4 or 1/2 tablet  if effective.). 12/20/21  Yes [name], MD  omeprazole (PRILOSEC) 40 MG capsule Take 40 mg by mouth every evening. 10/18/20  Yes [provider]  ondansetron (ZOFRAN) 8 MG tablet Take 1 tablet (8 mg total) by mouth 2 (two) times daily as needed for refractory nausea / vomiting. Start on day 2 after bendamustine chemo. 01/03/22  Yes Volanda Napoleon, MD  prochlorperazine (COMPAZINE) 10 MG tablet Take 1 tablet (10 mg total) by mouth every 6 (six) hours as needed (Nausea or vomiting). 01/03/22  Yes Volanda Napoleon, MD  rosuvastatin (CRESTOR) 5 MG tablet Take 5 mg by mouth daily.  Yes [provider]  sertraline (ZOLOFT) 100 MG tablet Take 100 mg by mouth every morning.   Yes [provider]  sodium chloride (OCEAN) 0.65 % SOLN nasal spray Place 1 spray into both nostrils as needed for congestion.   Yes [provider]  warfarin (COUMADIN) 5 MG tablet TAKE AS DIRECTED BY COUMADIN CLINIC 09/17/21  Yes Josue Hector, MD  cefdinir (OMNICEF) 300 MG capsule Take 2 capsules (600 mg total) by mouth daily. Patient not taking: Reported on 06/14/2022 05/31/22   Volanda Napoleon, MD    Inpatient Medications: Scheduled Meds:  acyclovir  400 mg Oral Daily   feeding supplement  237 mL Oral TID BM   megestrol  400 mg Oral BID   multivitamin with minerals  1 tablet Oral Daily   pantoprazole  40 mg Oral Daily   rosuvastatin  5 mg Oral Daily   sertraline  100 mg Oral q morning   Tbo-Filgrastim  480 mcg Subcutaneous q1800   Warfarin - Pharmacist Dosing Inpatient   Does not apply q1600   Continuous Infusions:  ceFEPime (MAXIPIME) IV 2 g (06/15/22 0319)   lactated ringers 75 mL/hr at 06/15/22 0706   metronidazole 500 mg (06/15/22 0144)   vancomycin 1,000 mg (06/14/22 2303)   PRN Meds: acetaminophen **OR** [DISCONTINUED] acetaminophen, ondansetron **OR** ondansetron  (ZOFRAN) IV  Allergies:    Allergies  Allergen Reactions   Hydrocodone-Acetaminophen Shortness Of Breath   Oxycodone Hcl Shortness Of Breath   Telmisartan-Hctz Shortness Of Breath, Palpitations and Other (See Comments)    Dizziness also   Aspirin Rash and Other (See Comments)    Confusion, also- Cannot take high doses   Atorvastatin Other (See Comments)    Myalgia    Buspirone Hcl Other (See Comments)    Headaches    Codeine Hives, Nausea And Vomiting and Other (See Comments)    Confusion also   Morphine Sulfate Itching   Paroxetine Other (See Comments)    Paresthesias: R arm, R leg, r side of face   Rabeprazole Other (See Comments)    headache   Ramipril Other (See Comments)    Unknown reaction    Social History:   Social History   Socioeconomic History   Marital status: Single    Spouse name: Not on file   Number of children: Not on file   Years of education: Not on file   Highest education level: Not on file  Occupational History    Comment: Retired - Tobacco   Tobacco Use   Smoking status: Former    Packs/day: 1.00    Years: 12.00    Total pack years: 12.00    Types: Cigarettes    Start date: 11/28/1968    Quit date: 01/29/1981    Years since quitting: 41.4   Smokeless tobacco: Never   Tobacco comments:    quit 76 yo   Vaping Use   Vaping Use: Never used  Substance and Sexual Activity   Alcohol use: Not Currently   Drug use: No   Sexual activity: Not on file  Other Topics Concern   Not on file  Social History Narrative   Single. No children. Lives alone      Retired from [employer].       Hobbies: travel, Scientist, product/process development, enjoys being outdoors including hiking   Social Determinants of Health   Financial Resource Strain: [place]  (01/21/2022)   Overall Financial Resource Strain (CARDIA)    Difficulty  of Paying Living Expenses: Not hard at all  Food Insecurity: No Food Insecurity (01/21/2022)   Hunger Vital Sign    Worried  About Running Out of Food in the Last Year: Never true    Ran Out of Food in the Last Year: Never true  Transportation Needs: No Transportation Needs (01/21/2022)   PRAPARE - Hydrologist (Medical): No    Lack of Transportation (Non-Medical): No  Physical Activity: Sufficiently Active (01/21/2022)   Exercise Vital Sign    Days of Exercise per Week: 5 days    Minutes of Exercise per Session: 60 min  Stress: No Stress Concern Present (01/21/2022)   [place]    Feeling of Stress : Not at all  Social Connections: Moderately Isolated (01/21/2022)   Social Connection and Isolation Panel [NHANES]    Frequency of Communication with Friends and Family: More than three times a week    Frequency of Social Gatherings with Friends and Family: More than three times a week    Attends Religious Services: Never    Marine scientist or Organizations: Yes    Attends Archivist Meetings: 1 to 4 times per year    Marital Status: Never married  Intimate Partner Violence: Not At Risk (01/21/2022)   Humiliation, Afraid, Rape, and Kick questionnaire    Fear of Current or Ex-Partner: No    Emotionally Abused: No    Physically Abused: No    Sexually Abused: No    Family History:    Family History  Problem Relation Age of Onset   Cancer Sister        uterine   Colon cancer Sister 67   Stroke Mother    Prostate cancer Father    Stroke Sister    Stroke Brother    Heart attack Neg Hx      ROS:  Please see the history of present illness.  N All other ROS reviewed and negative.     Physical Exam/Data:   Vitals:   06/15/22 0202 06/15/22 0500 06/15/22 0522 06/15/22 0523  BP: (!) 91/58  (!) 151/132 105/66  Pulse: 65  66   Resp: 18  18   Temp: 98 F (36.7 C)  98 F (36.7 C)   TempSrc:   Oral   SpO2: 96%  95%   Weight:  77.6 kg    Height:        Intake/Output Summary (Last 24 hours) at  06/15/2022 0903 Last data filed at 06/15/2022 0600 Gross per 24 hour  Intake 2148.35 ml  Output 2780 ml  Net -631.65 ml   Filed Weights   06/13/22 1857 06/14/22 0400 06/15/22 0500  Weight: 77.1 kg 77.1 kg 77.6 kg   Body mass index is 24.54 kg/m.  GENERAL:  Fatigued appearing, no distress HEENT:   Pupils equal round and reactive, fundi not visualized, oral mucosa unremarkable NECK:  No  jugular venous distention, waveform within normal limits, carotid upstroke brisk and symmetric, bilateral bruits, no thyromegaly LYMPHATICS:  No cervical, inguinal adenopathy LUNGS:   Clear to auscultation bilaterally BACK:  No CVA tenderness HEART:  PMI not displaced or sustained,S1 and S2 within normal limits, no S3, no S4, no clicks, no rubs, 2 out of 6 apical systolic murmur nonradiating and early peaking, no diastolic murmurs ABD:  Flat, positive bowel sounds normal in frequency in pitch, no bruits, no rebound, no guarding, no midline pulsatile mass, no  hepatomegaly, no splenomegaly EXT:  2 plus pulses throughout, no  edema, no cyanosis no clubbing SKIN:  No rashes no nodules NEURO:   Cranial nerves II through XII grossly intact, motor grossly intact throughout PSYCH:    Cognitively intact, oriented to person place and time   EKG:  The EKG was personally reviewed and demonstrates: Sinus rhythm, rate 99, right bundle branch block, left anterior fascicular block, premature atrial contractions in a bigeminal pattern, ventricular hypertrophy by voltage criteria.  Aside from ectopy no change from previous. Telemetry:  Telemetry was personally reviewed and demonstrates: Sinus rhythm.  Brief run of atrial tachycardia  Relevant CV Studies:  Echo:    1. Left ventricular ejection fraction, by estimation, is 55 to 60%. The  left ventricle has normal function. The left ventricle has no regional  wall motion abnormalities. There is moderate concentric left ventricular  hypertrophy. Left ventricular  diastolic  parameters are indeterminate.   2. Right ventricular systolic function is normal. The right ventricular  size is normal. There is normal pulmonary artery systolic pressure.   3. Left atrial size was mildly dilated.   4. The mitral valve is normal in structure. Mild mitral valve  regurgitation.   5. No vegetation or perivalvular abscess is seen. The aortic valve has  been repaired/replaced. Aortic valve regurgitation is not visualized.  There is a 25 mm Carpentier bioprosthetic valve present in the aortic  position. Procedure Date: 2011. Echo  findings are consistent with stenosis of the aortic prosthesis. Aortic  valve mean gradient measures 46.5 mmHg. Aortic valve Vmax measures 4.38  m/s. Aortic valve acceleration time measures 90 msec.   6. There is moderate dilatation of the ascending aorta, measuring 47 mm.   Laboratory Data:  Chemistry Recent Labs  Lab 06/13/22 2100 06/14/22 0748 06/15/22 0337  NA 139 140 139  K 4.1 4.1 3.7  CL 107 111 110  CO2 $Re'24 22 23  'CRx$ GLUCOSE 100* 93 128*  BUN 41* 35* 33*  CREATININE 2.07* 1.67* 1.40*  CALCIUM 8.8* 8.2* 8.1*  GFRNONAA 33* 42* 52*  ANIONGAP $RemoveB'8 7 6    'MctfptgD$ Recent Labs  Lab 06/13/22 2100 06/14/22 0748 06/15/22 0337  PROT 6.1* 4.9* 4.9*  ALBUMIN 3.3* 2.7* 2.7*  AST 85* 69* 70*  ALT 46* 36 37  ALKPHOS 157* 123 129*  BILITOT 0.5 0.6 0.5   Hematology Recent Labs  Lab 06/13/22 2100 06/14/22 0531 06/15/22 0337  WBC 0.5* 0.7* 0.9*  RBC 3.63* 3.16* 3.01*  2.99*  HGB 11.6* 10.0* 9.5*  HCT 35.1* 32.9* 29.4*  MCV 96.7 104.1* 97.7  MCH 32.0 31.6 31.6  MCHC 33.0 30.4 32.3  RDW 14.1 14.3 14.2  PLT 47* 43* 35*   Cardiac EnzymesNo results for input(s): "TROPONINI" in the last 168 hours. No results for input(s): "TROPIPOC" in the last 168 hours.  BNPNo results for input(s): "BNP", "PROBNP" in the last 168 hours.  DDimer No results for input(s): "DDIMER" in the last 168 hours.  Radiology/Studies:  MR BRAIN W WO CONTRAST  Result  Date: 06/14/2022 CLINICAL DATA:  Altered mental status.  Possible brain metastases. EXAM: MRI HEAD WITHOUT AND WITH CONTRAST TECHNIQUE: Multiplanar, multiecho pulse sequences of the brain and surrounding structures were obtained without and with intravenous contrast. CONTRAST:  7.82mL GADAVIST GADOBUTROL 1 MMOL/ML IV SOLN COMPARISON:  09/02/2017 FINDINGS: Brain: No acute infarct, mass effect or extra-axial collection. No acute or chronic hemorrhage. There is multifocal hyperintense T2-weighted signal within the white matter. Generalized volume loss. There  are old bilateral cerebellar small vessel infarcts. There is no abnormal contrast enhancement. Vascular: Major flow voids are preserved. Skull and upper cervical spine: Normal calvarium and skull base. Visualized upper cervical spine and soft tissues are normal. Sinuses/Orbits:No paranasal sinus fluid levels or advanced mucosal thickening. No mastoid or middle ear effusion. Normal orbits. IMPRESSION: 1. No intracranial metastatic disease. 2. Chronic small vessel ischemia and volume loss. Electronically Signed   By: Ulyses Jarred M.D.   On: 06/14/2022 22:45   ECHOCARDIOGRAM COMPLETE  Result Date: 06/14/2022    ECHOCARDIOGRAM REPORT   Patient Name:   [name] Date of Exam: 06/14/2022 Medical Rec #:  [id number]      Height:       70.0 in Accession #:    [id number]     Weight:       170.0 lb Date of Birth:  [birth year]      BSA:          1.948 m Patient Age:    59 years       BP:           93/58 mmHg Patient Gender: M              HR:           72 bpm. Exam Location:  Inpatient Procedure: 2D Echo, Color Doppler and Cardiac Doppler Indications:    R06.9 DOE  History:        Patient has prior history of Echocardiogram examinations, most                 recent 07/13/2021. Arrythmias:Atrial Fibrillation; Risk                 Factors:Hypertension and Dyslipidemia. Aortic Valve Replaced in                 2011.  Sonographer:    Raquel Sarna Senior RDCS Referring Phys: [place]  1. Left ventricular ejection fraction, by estimation, is 50%. The left ventricle has mildly decreased function. The left ventricle demonstrates global hypokinesis. There is mild left ventricular hypertrophy. Left ventricular diastolic parameters are consistent with Grade II diastolic dysfunction (pseudonormalization).  2. Right ventricular systolic function is normal. The right ventricular size is normal. There is normal pulmonary artery systolic pressure. The estimated right ventricular systolic pressure is 80.9 mmHg.  3. Left atrial size was moderately dilated.  4. Right atrial size was mildly dilated.  5. The mitral valve is normal in structure. Trivial mitral valve regurgitation. No evidence of mitral stenosis.  6. Bioprosthetic aortic valve present, the valve appears thickened/calcified. Trivial aortic insufficiency. Mean gradient elevated at 47 mmHg suggesting severe stenosis. Dimensionless index 0.25, AVA 0.87 cm^2.  7. Aortic dilatation noted. There is mild dilatation of the aortic root, measuring 39 mm.  8. The inferior vena cava is normal in size with greater than 50% respiratory variability, suggesting right atrial pressure of 3 mmHg. FINDINGS  Left Ventricle: Left ventricular ejection fraction, by estimation, is 50%. The left ventricle has mildly decreased function. The left ventricle demonstrates global hypokinesis. The left ventricular internal cavity size was normal in size. There is mild left ventricular hypertrophy. Left ventricular diastolic parameters are consistent with Grade II diastolic dysfunction (pseudonormalization). Right Ventricle: The right ventricular size is normal. No increase in right ventricular wall thickness. Right ventricular systolic function is normal. There is normal pulmonary artery systolic pressure. The tricuspid regurgitant velocity is 2.71 m/s, and  with an assumed  right atrial pressure of 3 mmHg, the estimated right ventricular systolic pressure is 35.5  mmHg. Left Atrium: Left atrial size was moderately dilated. Right Atrium: Right atrial size was mildly dilated. Pericardium: There is no evidence of pericardial effusion. Mitral Valve: The mitral valve is normal in structure. Trivial mitral valve regurgitation. No evidence of mitral valve stenosis. Tricuspid Valve: The tricuspid valve is normal in structure. Tricuspid valve regurgitation is mild. Aortic Valve: Bioprosthetic aortic valve present, the valve appears thickened/calcified. Trivial aortic insufficiency. Mean gradient elevated at 47 mmHg suggesting severe stenosis. Dimensionless index 0.25, AVA 0.87 cm^2. The aortic valve has been repaired/replaced. Aortic valve regurgitation is trivial. Aortic valve mean gradient measures 47.0 mmHg. Aortic valve peak gradient measures 71.6 mmHg. Aortic valve area, by VTI measures 0.88 cm. Pulmonic Valve: The pulmonic valve was normal in structure. Pulmonic valve regurgitation is trivial. Aorta: Aortic dilatation noted. There is mild dilatation of the aortic root, measuring 39 mm. Venous: The inferior vena cava is normal in size with greater than 50% respiratory variability, suggesting right atrial pressure of 3 mmHg. IAS/Shunts: No atrial level shunt detected by color flow Doppler.  LEFT VENTRICLE PLAX 2D LVIDd:         5.20 cm   Diastology LVIDs:         3.80 cm   LV e' medial:    5.00 cm/s LV PW:         1.10 cm   LV E/e' medial:  13.2 LV IVS:        1.10 cm   LV e' lateral:   9.79 cm/s LVOT diam:     2.10 cm   LV E/e' lateral: 6.7 LV SV:         86 LV SV Index:   44 LVOT Area:     3.46 cm  RIGHT VENTRICLE RV S prime:     11.60 cm/s TAPSE (M-mode): 1.5 cm LEFT ATRIUM              Index        RIGHT ATRIUM           Index LA diam:        3.80 cm  1.95 cm/m   RA Area:     18.10 cm LA Vol (A2C):   101.0 ml 51.85 ml/m  RA Volume:   46.50 ml  23.87 ml/m LA Vol (A4C):   101.0 ml 51.85 ml/m LA Biplane Vol: 103.0 ml 52.88 ml/m  AORTIC VALVE AV Area (Vmax):    0.86 cm AV  Area (Vmean):   0.85 cm AV Area (VTI):     0.88 cm AV Vmax:           423.00 cm/s AV Vmean:          328.000 cm/s AV VTI:            0.978 m AV Peak Grad:      71.6 mmHg AV Mean Grad:      47.0 mmHg LVOT Vmax:         105.00 cm/s LVOT Vmean:        80.750 cm/s LVOT VTI:          0.250 m LVOT/AV VTI ratio: 0.26  AORTA Ao Root diam: 3.90 cm Ao Asc diam:  3.60 cm MITRAL VALVE               TRICUSPID VALVE MV Area (PHT): 2.87 cm    TR Peak grad:   29.4 mmHg  MV Decel Time: 264 msec    TR Vmax:        271.00 cm/s MV E velocity: 66.00 cm/s MV A velocity: 41.10 cm/s  SHUNTS MV E/A ratio:  1.61        Systemic VTI:  0.25 m                            Systemic Diam: 2.10 cm Dalton McleanMD Electronically signed by Franki Monte Signature Date/Time: 06/14/2022/3:32:16 PM    Final    CT BONE MARROW BIOPSY & ASPIRATION  Result Date: 06/14/2022 INDICATION: 76 year old with history of CLL.  Leukopenia and thrombocytopenia. EXAM: CT GUIDED BONE MARROW ASPIRATES AND BIOPSY Physician: Stephan Minister. Anselm Pancoast, MD MEDICATIONS: 1% lidocaine: Local anesthetic ANESTHESIA/SEDATION: None COMPLICATIONS: None immediate. PROCEDURE: The procedure was explained to the patient. The risks and benefits of the procedure were discussed and the patient's questions were addressed. Informed consent was obtained from the patient. The patient was placed prone on CT table. Images of the pelvis were obtained. The right side of back was prepped and draped in sterile fashion. The skin and right posterior ilium were anesthetized with 1% lidocaine. 11 gauge bone needle was directed into the right ilium with CT guidance. Two aspirates and one core biopsy were obtained. Bandage placed over the puncture site. RADIATION DOSE REDUCTION: This exam was performed according to the departmental dose-optimization program which includes automated exposure control, adjustment of the mA and/or kV according to patient size and/or use of iterative reconstruction technique. FINDINGS:  Biopsy needle was directed into the posterior right ilium. IMPRESSION: CT guided bone marrow aspiration and core biopsy. Electronically Signed   By: Markus Daft M.D.   On: 06/14/2022 13:35   US Abdomen Complete  Result Date: 06/14/2022 CLINICAL DATA:  History of chronic lymphocytic leukemia. EXAM: ABDOMEN ULTRASOUND COMPLETE COMPARISON:  CT scan of February 09, 2022. FINDINGS: Gallbladder: No gallstones or wall thickening visualized. No sonographic Murphy sign noted by sonographer. Common bile duct: Diameter: 4 mm which is within normal limits. Liver: 8 mm rounded hypoechoic abnormality is noted in right hepatic lobe which is not diagnostic for a cyst based on ultrasound criteria. Within normal limits in parenchymal echogenicity. Portal vein is patent on color Doppler imaging with normal direction of blood flow towards the liver. IVC: No abnormality visualized. Pancreas: Visualized portion unremarkable. Spleen: Measures 14.9 x 12.2 x 6.8 cm with calculated volume of 644 cc, consistent with mild splenomegaly. No focal abnormality is noted. Right Kidney: Length: 12.8 cm. Two cysts are noted, the largest measuring 2.9 cm. Echogenicity within normal limits. No mass or hydronephrosis visualized. Left Kidney: Length: 12.1 cm. 2.3 cm simple cyst is seen in lower pole. Echogenicity within normal limits. No mass or hydronephrosis visualized. Abdominal aorta: No aneurysm visualized. Other findings: None. IMPRESSION: 8 mm rounded hypoechoic abnormality noted in right hepatic lobe which is not diagnostic for cyst based on ultrasound criteria. Neoplasm or metastatic disease cannot be excluded. When the patient is clinically stable and able to follow directions and hold their breath (preferably as an outpatient) further evaluation with dedicated abdominal MRI should be considered. Mild splenomegaly is noted. Electronically Signed   By: Marijo Conception M.D.   On: 06/14/2022 08:43   DG Chest 2 View  Result Date:  06/13/2022 CLINICAL DATA:  Altered mental status and weakness for several days, initial encounter EXAM: CHEST - 2 VIEW COMPARISON:  05/31/2022 FINDINGS: Cardiac shadow is within  limits. Postsurgical changes are again seen. Aortic calcifications are noted. Lungs are clear. No bony abnormality is noted. IMPRESSION: No active cardiopulmonary disease. Electronically Signed   By: Inez Catalina M.D.   On: 06/13/2022 20:48   CT HEAD WO CONTRAST (5MM)  Result Date: 06/13/2022 CLINICAL DATA:  Dizziness EXAM: CT HEAD WITHOUT CONTRAST TECHNIQUE: Contiguous axial images were obtained from the base of the skull through the vertex without intravenous contrast. RADIATION DOSE REDUCTION: This exam was performed according to the departmental dose-optimization program which includes automated exposure control, adjustment of the mA and/or kV according to patient size and/or use of iterative reconstruction technique. COMPARISON:  11/07/2021 FINDINGS: Brain: No acute intracranial findings are seen. There are no signs of bleeding within the cranium. Cortical sulci are prominent. There is decreased density in periventricular and subcortical white matter. Calcifications are seen in basal ganglia on both sides. Vascular: Scattered arterial calcifications are seen. Skull: No fracture is seen. Sinuses/Orbits: There are no air-fluid levels in paranasal sinuses. There is partial opacification of left side of sphenoid sinus. There is minimal mucosal thickening in ethmoid and maxillary sinuses. Other: No significant interval changes are noted. IMPRESSION: No acute intracranial findings are seen in noncontrast CT brain. Atrophy. Small-vessel disease. Chronic sinusitis. Electronically Signed   By: Elmer Picker M.D.   On: 06/13/2022 20:13    Assessment and Plan:   Aortic stenosis:  He has a bioprosthetic AV.   AS as above.  This is not changed compared to August 2022.  The mean gradient is unchanged.  EF is 50%.   I do not think this is  contributing to current symptoms.  There is no evidence of endocarditis either on physical exam or get on blood cultures.  At this point not suggesting further cardiovascular testing.  History of PE:  He has been on warfarin at the direction of Dr. [name] as this was felt to be the only option given valvular microthrombosis when he was on Xarelto.  No change in therapy.  CKD IIIA:  Creat is slightly improved.  Follow.     Pancytopenia:  He is being followed actively by [name] this admission .   PAF: He is maintaining sinus rhythm.  No change in therapy.  For questions or updates, please contact [facility] Please consult [email or web address] for contact info under Cardiology/STEMI.   Signed, Minus Breeding, MD  06/15/2022 9:03 AM

## 2022-06-15 NOTE — Evaluation (Signed)
Physical Therapy Evaluation Patient Details Name: [name] MRN: [id number] DOB: 11/17/1945 Today's Date: 06/15/2022  History of Present Illness  76 yo male presents to ED with AMS, weakness x3 days. +  leukopenia and thrombocytopenia. Pt receives chemotherapy for CLL, last chemo >2 months ago. s/p CT guided bone marrow biopsy on 8/4. seizure like activity 8/4. PMH includes AVR, CKDIII, PE, afib, HLD, HTN.  Clinical Impression   Pt presents with impaired strength, cognition, balance, and activity tolerance vs baseline. Pt to benefit from acute PT to address deficits. Pt ambulated short hallway distance requiring close guard-min assist for mobility as well as UE support for steadying. At baseline pt reports being completely independent. Pt would benefit from ST-SNF prior to d/c home given pt lives alone.  PT to progress mobility as tolerated, and will continue to follow acutely.         Recommendations for follow up therapy are one component of a multi-disciplinary discharge planning process, led by the attending physician.  Recommendations may be updated based on patient status, additional functional criteria and insurance authorization.  Follow Up Recommendations Skilled nursing-short term rehab (<3 hours/day) Can patient physically be transported by private vehicle: Yes    Assistance Recommended at Discharge Intermittent Supervision/Assistance  Patient can return home with the following  A little help with walking and/or transfers;A little help with bathing/dressing/bathroom;Assistance with cooking/housework;Direct supervision/assist for medications management;Help with stairs or ramp for entrance;Direct supervision/assist for financial management    Equipment Recommendations None recommended by PT  Recommendations for Other Services       Functional Status Assessment Patient has had a recent decline in their functional status and demonstrates the ability to make significant  improvements in function in a reasonable and predictable amount of time.     Precautions / Restrictions Precautions Precautions: Fall Restrictions Weight Bearing Restrictions: No      Mobility  Bed Mobility Overal bed mobility: Needs Assistance Bed Mobility: Supine to Sit     Supine to sit: Min guard, HOB elevated     General bed mobility comments: for safety, use of bedrails and HOB elevation    Transfers Overall transfer level: Needs assistance Equipment used: 1 person hand held assist Transfers: Sit to/from Stand Sit to Stand: Min assist           General transfer comment: light assis for rise and steady, pt reaching for IV pole to self-steady once standing    Ambulation/Gait Ambulation/Gait assistance: Min guard Gait Distance (Feet): 60 Feet Assistive device: IV Pole Gait Pattern/deviations: Step-through pattern, Decreased stride length, Trunk flexed, Shuffle Gait velocity: decr     General Gait Details: slowed, shuffling steps requiring use of UE support for self-steadying  Stairs            Wheelchair Mobility    Modified Rankin (Stroke Patients Only)       Balance Overall balance assessment: Needs assistance Sitting-balance support: No upper extremity supported, Feet supported Sitting balance-Leahy Scale: Fair     Standing balance support: Single extremity supported, During functional activity Standing balance-Leahy Scale: Fair                               Pertinent Vitals/Pain Pain Assessment Pain Assessment: No/denies pain    Home Living Family/patient expects to be discharged to:: Private residence Living Arrangements: Alone Available Help at Discharge: Family Type of Home: [facility]  Layout: One level Home Equipment: None      Prior Function Prior Level of Function : Independent/Modified Independent             Mobility Comments: pt reports being independent and active PTA, has a dog named  Insurance risk surveyor Dominance   Dominant Hand: Right    Extremity/Trunk Assessment   Upper Extremity Assessment Upper Extremity Assessment: Defer to OT evaluation    Lower Extremity Assessment Lower Extremity Assessment: Generalized weakness    Cervical / Trunk Assessment Cervical / Trunk Assessment: Normal  Communication   Communication: Other (comment);No difficulties (halting speech at times)  Cognition Arousal/Alertness: Awake/alert Behavior During Therapy: Flat affect Overall Cognitive Status: Impaired/Different from baseline Area of Impairment: Orientation, Attention, Memory, Following commands, Safety/judgement, Problem solving                 Orientation Level: Situation Current Attention Level: Sustained Memory: Decreased short-term memory Following Commands: Follows one step commands with increased time Safety/Judgement: Decreased awareness of deficits   Problem Solving: Slow processing, Decreased initiation General Comments: pt with halting speech, and very increased time to respond to PT questions. Pt states he is in the hospital because "I wasn't doing as much as I was"        General Comments      Exercises     Assessment/Plan    PT Assessment Patient needs continued PT services  PT Problem List Decreased strength;Decreased mobility;Decreased safety awareness;Decreased cognition;Decreased activity tolerance;Decreased balance       PT Treatment Interventions DME instruction;Therapeutic activities;Gait training;Therapeutic exercise;Patient/family education;Balance training;Stair training;Functional mobility training;Neuromuscular re-education    PT Goals (Current goals can be found in the Care Plan section)  Acute Rehab PT Goals Patient Stated Goal: figure out what is going on PT Goal Formulation: With patient/family Time For Goal Achievement: 06/29/22 Potential to Achieve Goals: Good    Frequency Min 3X/week     Co-evaluation                AM-PAC PT "6 Clicks" Mobility  Outcome Measure Help needed turning from your back to your side while in a flat bed without using bedrails?: A Little Help needed moving from lying on your back to sitting on the side of a flat bed without using bedrails?: A Little Help needed moving to and from a bed to a chair (including a wheelchair)?: A Little Help needed standing up from a chair using your arms (e.g., wheelchair or bedside chair)?: A Little Help needed to walk in hospital room?: A Little Help needed climbing 3-5 steps with a railing? : A Lot 6 Click Score: 17    End of Session   Activity Tolerance: Patient limited by fatigue Patient left: in chair;with call bell/phone within reach;with chair alarm set;with family/visitor present Nurse Communication: Mobility status PT Visit Diagnosis: Other abnormalities of gait and mobility (R26.89);Muscle weakness (generalized) (M62.81)    Time: 3817-7116 PT Time Calculation (min) (ACUTE ONLY): 36 min   Charges:   PT Evaluation $PT Eval Low Complexity: 1 Low PT Treatments $Therapeutic Activity: 8-22 mins        Stacie Glaze, PT DPT Acute Rehabilitation Services Pager [phone]  Office [phone]   [place]oshen E Ruffin Pyo 06/15/2022, 1:41 PM

## 2022-06-15 NOTE — Progress Notes (Signed)
[place] for Warfarin Indication: h/o recurrent PE, bioprosthetic AVR, PAFib  Allergies  Allergen Reactions   Hydrocodone-Acetaminophen Shortness Of Breath   Oxycodone Hcl Shortness Of Breath   Telmisartan-Hctz Shortness Of Breath, Palpitations and Other (See Comments)    Dizziness also   Aspirin Rash and Other (See Comments)    Confusion, also- Cannot take high doses   Atorvastatin Other (See Comments)    Myalgia    Buspirone Hcl Other (See Comments)    Headaches    Codeine Hives, Nausea And Vomiting and Other (See Comments)    Confusion also   Morphine Sulfate Itching   Paroxetine Other (See Comments)    Paresthesias: R arm, R leg, r side of face   Rabeprazole Other (See Comments)    headache   Ramipril Other (See Comments)    Unknown reaction    Patient Measurements: Height: '5\' 10"'$  (177.8 cm) Weight: 77.6 kg (171 lb) IBW/kg (Calculated) : 73  Vital Signs: Temp: 98 F (36.7 C) (08/05 0522) Temp Source: Oral (08/05 0522) BP: 105/66 (08/05 0523) Pulse Rate: 66 (08/05 0522)  Labs: Recent Labs    06/13/22 2100 06/13/22 2200 06/14/22 0531 06/14/22 0748 06/14/22 0846 06/15/22 0337  HGB 11.6*  --  10.0*  --   --  9.5*  HCT 35.1*  --  32.9*  --   --  29.4*  PLT 47*  --  43*  --   --  35*  LABPROT  --  34.5* 39.9*  --   --  36.2*  INR  --  3.5* 4.2*  --   --  3.7*  CREATININE 2.07*  --   --  1.67*  --  1.40*  CKTOTAL  --   --   --   --  124  --   TROPONINIHS 63*  --   --   --   --   --      Estimated Creatinine Clearance: 46.3 mL/min (A) (by C-G formula based on SCr of 1.4 mg/dL (H)).  Medications:  PTA Warfarin  Assessment: 76 yr male with CLL undergoing chemotherapy.  PMH significant for bioprosthetic AVR, CKD, recurent PE, PAF Med history documented patient states takes warfarin occasionally and is unsure of how was supposed to be taking this medication. Stated he took Warfarin '15mg'$  on Fri and Sat, then '10mg'$  on  Mon, Wed and Thur Per Onc note 7/7, dose listed '15mg'$  Sun/Thu, '10mg'$  all other days  Today, 06/15/2022 INR 3.7, remains supratherapeutic CBC: Hgb slightly decreased No bleeding reported Possible drug interaction with metronidazole - can increase warfarin sensitivity  Goal of Therapy:  INR goal 2.5-3.0 per Onc note 7/7   Plan:  Hold warfarin again today Daily PT/INR  Peggyann Juba, PharmD, BCPS Pharmacy: [phone] 06/15/2022,8:30 AM

## 2022-06-15 NOTE — Plan of Care (Signed)

## 2022-06-15 NOTE — Progress Notes (Signed)
OT Cancellation Note  Patient Details Name: [name] MRN: [id number] DOB: May 01, 1946   Cancelled Treatment:    Reason Eval/Treat Not Completed: Other (comment) Patient reporting feeling fatigued this afternoon with nurse called into room to address IV that was displaced. OT to continue to follow and check back tomorrow AM per patient request.  Jackelyn Poling OTR/L, [place] Acute Rehabilitation Department Office# [phone] Pager# [phone]  06/15/2022, 2:48 PM

## 2022-06-15 NOTE — Progress Notes (Signed)
MRI called RN @ 2206 and mentioned that they had gotten patient off the MRI table and into the wheelchair when patient had a possible seizure. RN Environmental health practitioner, Rapid Response Nurse, and on call provider. MRI brought the patient back up to the patient's room and Rapid Response came up to the floor to assess the patient. MRI mentioned that the patient was shaking, the patient closed his eyes, became incontinent, and become unresponsive. The patient described to the Rapid Response Nurse that the feeling before it happened was like a bouncy ball full of energy, and he felt off/ different. MRI mentioned to Rapid Response Nurse that the patient remained unresponsive for 7 minutes or less for the estimated time. Vitals and CBG were obtained on patient. Patient's CBG was 86. Patient had not eaten since 1430 due to waiting on MRI to take him down for the tests. Patient's O2 sats had dropped down to 76% room air. Rapid Response Nurse put patient on 5L of O2 initially until O2 sats came back up. Patients O2 sats went back up to 100%, and patient was weaned down to Room Air. Rapid Response was asking the patient questions and the patient was alert and answering the questions. Rapid Response Nurse gave the patient 8 ounces of cranberry juice. The patient's hands continues to be a bit shaky. On call provider put in new orders, and seizure precautions were put in place.

## 2022-06-15 NOTE — Progress Notes (Signed)
Cross Cover  Reported by primary and rapid response nurse paitent was returned from MRI with reports of event described as "seizure like activity "displayed by patient when coming off the MRI table  Reported to rapid response nurse paitent was unresponsive for less than 7 minutes nd hd urinary incontinence during the event.  When arrived to [place] patient ws shaky, cbg 93, was iven juice.VSS, SR on monitor Recent echo with EF 60-65% DDX nott inclusive vagal event vs new onset seizures or debilitation MRI of the head did not show metstatic disease ir acute findings. Chronic cerebellar infarct   Action - placed on seizure precautions. Check orthostatic vitals in am

## 2022-06-15 NOTE — Progress Notes (Addendum)
Notified on call provider that patient was not able to do orthostatics. Patient got to shaking real bad. Patient's first BP was 151/132, which was taken right after the Nurse Tech got patient up to get daily weight and then the Nurse Tech rechecked it again after patient had settled down and it was 105/66. Per on call provider, will pass along to dayshift nurse to retry orthostatic vitals.

## 2022-06-15 NOTE — Progress Notes (Addendum)
PROGRESS NOTE  [name] KNL:[id number] DOB: [birth year]   PCP: [name], MD  Patient is from: Home.  Lives alone.  Independently ambulates at baseline.  DOA: 06/13/2022 LOS: 1  Chief complaints Chief Complaint  Patient presents with   Weakness   chemo patient     Brief Narrative / Interim history: 76 year old M with PMH of prostate cancer s/p prostatectomy, CLL, bioprosthetic AVR, recurrent PE on Coumadin, PAF and CKD-3B brought to ED after found down on his knees and hands by his friend.  Patient denied fall but fatigue.  No signs of serious injury.  In ED, mild temp to 99.6.  Otherwise vital stable.  WBC 0.5 with ANC of 200.  Hgb 11.6.  Platelet 47 (baseline in the 90s).  Cr 2.0 (from 1.5 about a months ago).  AST 85.  ALT 46.  Total bili 0.5.  ALP 157.  Lactic acid normal.  Troponin 63.  INR 3.5.  UA with moderate Hgb.  CT head and CXR without acute finding.  EKG sinus rhythm with PACs.  Oncology consulted.  Patient was started on broad-spectrum antibiotics and admitted for altered mental status, AKI and neutropenia.  TTE ordered.  The next day, AKI and LFT improved.  RUQ [name] with 8 mm right hepatic lobe lesion.  He underwent bone marrow biopsy.  MRI liver, MRI brain and blood cultures ordered.  Remains on broad-spectrum antibiotic for neutropenia.   MRI brain without acute or significant finding.  MRI liver was motion limited and showed innumerable small T2 hyperintense lesions throughout the liver suggesting regenerating nodules but no focal lesion or abnormal enhancement.  Follow-up contrast-enhanced CT was recommended.    Patient had seizure-like activity while getting an MRI.  EEG ordered.  Neurology consulted.  TTE showed LVEF of 50%, GH, G2 DD, RVSP of 32.4, moderate LAE, severe bioprosthetic aortic valve stenosis.  Cardiology consulted and did not feel further cardiac testing is indicated since his TTE finding are unchanged compared to his TTE in  2022.  Subjective: Seen and examined earlier this morning.  Patient had seizure-like activity when he was getting MRI.  Patient has no recollection although he is oriented x4.  There was also some concern about tremors.  Per patient and his aunt at bedside, tremors get worse when he gets up or he drinks coffee.  Patient denies pain, headache, vision change, chest pain, dyspnea, GI or UTI symptoms.  However, he is and reports DOE and fatigue with minimal exertion.  Per his aunt, it seems his mental status and cognition is about the same compared to when she saw him about 5 months ago.   Objective: Vitals:   06/15/22 0500 06/15/22 0522 06/15/22 0523 06/15/22 0900  BP:  (!) 151/132 105/66 103/60  Pulse:  66  65  Resp:  18  16  Temp:  98 F (36.7 C)  97.7 F (36.5 C)  TempSrc:  Oral  Oral  SpO2:  95%  97%  Weight: 77.6 kg     Height:        Examination:  GENERAL: No apparent distress.  Nontoxic. HEENT: MMM.  Vision and hearing grossly intact.  NECK: Supple.  No apparent JVD.  RESP:  No IWOB.  Fair aeration bilaterally. CVS:  RRR.  2/6 SEM over RUSB.  Mechanical heart sounds ABD/GI/GU: BS+. Abd soft, NTND.  MSK/EXT:  Moves extremities. No apparent deformity. No edema.  SKIN: no apparent skin lesion or wound NEURO: Awake and alert. Oriented x4 but  limited insight and recall.  No apparent focal neuro deficit. PSYCH: Calm. Normal affect.   Procedures:  8/4-bone marrow biopsy  Microbiology summarized: COVID-19 PCR negative. Blood culture NGTD.  Assessment and plan: Principal Problem:   Altered mental status Active Problems:   Neutropenia ([place])   Acute renal failure superimposed on stage 3b chronic kidney disease (HCC)   Chronic lymphocytic leukemia (HCC)   S/P Bioprosthetic AVR (aortic valve replacement) in 2011   History of pulmonary embolism   Pancytopenia (HCC)   Transaminitis   Liver lesion, right lobe   AMS (altered mental status)   Severe aortic stenosis   Chronic  diastolic CHF (congestive heart failure) ([place])   Seizure-like activity ([place])  Altered mental status: Unclear how long this has been going on.  Looks like cognitive decline.  He has orthostatic hypotension as well.  Per patient's aunt, his cognition seems to be the same as when she saw him 5 months ago.  Has no focal neurodeficit.  He is oriented x4 but limited insight and recalls.  CT head and MRI without acute finding.  He has no focal neuro symptoms.  No signs of meningism.  CT head, MRI brain, ammonia, B12 and TSH unrevealing.  He has history of CLL.  Also had seizure-like activity while getting MRI -Ordered EEG.  Neurology consulted. -Follow [place] serologies. -Reorientation and delirium precautions -Continue broad-spectrum antibiotics  Seizure-like activity-reportedly had shaking and urinary incontinence while getting MRI.  -Seizure precaution, EEG and neurology consult  Addendum Discussed with neurology.  Low suspicion for seizure.  Neurology believes patient's symptoms are most likely from orthostatic hypotension.  Also does not feel LP is needed from neurology standpoint unless his oncologist feels strongly about this.  Neurology signed off but available as needed  Severe aortic valve stenosis inpatient with Bioprosthetic AVR-noted on TTE.  Per aunt, DOE and "tremors" with exertion.  He might be having DOE and presyncope.  However, cardiology felt his TTE finding are stable compared to his TTE in August 2022.  Cardiology also does not believe his valvar stenosis is contributing to his symptoms.  His orthostatic vital signs positive.  Orthostatic hypotension: Probably contributing to his tremors with activity and exertion and DOE.  Orthostatic vitals positive.  I think part of this is from his severe aortic stenosis -Cardiology on board. -TED hose -Elevate head of bed to at least 30 degree -Fall precaution  AKI on CKD-3A: Likely prerenal.  Improving with IV fluid.  Not on nephrotoxic  meds.  AKI resolved. Recent Labs    01/17/22 1459 01/31/22 0831 02/25/22 1420 03/28/22 0855 04/10/22 1049 05/08/22 1007 05/22/22 0901 06/13/22 2100 06/14/22 0748 06/15/22 0337  BUN 18 21 28* 27* 24* 27* 32* 41* 35* 33*  CREATININE 1.12 1.28* 1.79* 1.29* 1.27* 1.28* 1.53* 2.07* 1.67* 1.40*  -Continue gentle IV fluid -Monitor  Neutropenia/pancytopenia/CLL: Reportedly has not had chemotherapy in 2 months.  Leukopenia improving -Continue broad-spectrum antibiotics -Continue home acyclovir -Follow bone marrow biopsy result -Oncology following.  Liver lesion/mild transaminitis: LFT improved.  Colonoscopy in 2014 with mild diverticulosis, and no mentions of polyps. MRI liver was motion limited and showed innumerable small T2 hyperintense lesions throughout the liver suggesting regenerating nodules but no focal lesion or abnormal enhancement.  AFP and CEA negative. -Follow-up contrast-enhanced CT was recommended.   -Continue monitoring  History of pulmonary embolism/supratherapeutic INR -Hold warfarin while INR is high.  Paroxysmal A-fib: Currently in sinus rhythm.  Not on rate or rhythm control -Continue monitoring  Chronic  diastolic CHF: TTE with LVEF of 50%, GH, G2-DD and severe aortic stenosis.  Appears euvolemic on exam.  Not on diuretics at home.  Denies dyspnea or chest pain. -Monitor fluid status   Generalized weakness/fatigue: Independently ambulates at baseline. -PT/OT   Decreased oral intake Body mass index is 24.54 kg/m. Consult dietitian  Nutrition Problem: Inadequate oral intake Etiology: decreased appetite Signs/Symptoms: per patient/family report Interventions: Ensure Enlive (each supplement provides 350kcal and 20 grams of protein), MVI   DVT prophylaxis:  SCDs Start: 06/14/22 0347 in the setting of thrombocytopenia and supratherapeutic INR  Code Status: Full code Family Communication: Updated patient's aunt at bedside. Level of care: Telemetry Status  is: Inpatient The patient will remain inpatient because: Altered mental status, neutropenia, AKI and seizure-like activity   Final disposition: TBD Consultants:  Oncology Neurology Cardiology  Sch Meds:  Scheduled Meds:  acyclovir  400 mg Oral Daily   feeding supplement  237 mL Oral TID BM   megestrol  400 mg Oral BID   multivitamin with minerals  1 tablet Oral Daily   pantoprazole  40 mg Oral Daily   rosuvastatin  5 mg Oral Daily   sertraline  100 mg Oral q morning   Tbo-Filgrastim  480 mcg Subcutaneous q1800   Warfarin - Pharmacist Dosing Inpatient   Does not apply q1600   Continuous Infusions:  ceFEPime (MAXIPIME) IV 2 g (06/15/22 0319)   lactated ringers 75 mL/hr at 06/15/22 1223   metronidazole 500 mg (06/15/22 0144)   vancomycin 1,000 mg (06/14/22 2303)   PRN Meds:.acetaminophen **OR** [DISCONTINUED] acetaminophen, ondansetron **OR** ondansetron (ZOFRAN) IV  Antimicrobials: Anti-infectives (From admission, onward)    Start     Dose/Rate Route Frequency Ordered Stop   06/15/22 1200  vancomycin (VANCOREADY) IVPB 1250 mg/250 mL  Status:  Discontinued        1,250 mg 166.7 mL/hr over 90 Minutes Intravenous Every 36 hours 06/14/22 0228 06/14/22 0959   06/14/22 2200  vancomycin (VANCOCIN) IVPB 1000 mg/200 mL premix        1,000 mg 200 mL/hr over 60 Minutes Intravenous Every 24 hours 06/14/22 0959     06/14/22 1500  ceFEPIme (MAXIPIME) 2 g in sodium chloride 0.9 % 100 mL IVPB        2 g 200 mL/hr over 30 Minutes Intravenous Every 12 hours 06/14/22 0220     06/14/22 1000  acyclovir (ZOVIRAX) tablet 400 mg        400 mg Oral Daily 06/14/22 0346     06/14/22 0230  metroNIDAZOLE (FLAGYL) IVPB 500 mg        500 mg 100 mL/hr over 60 Minutes Intravenous Every 12 hours 06/14/22 0206     06/13/22 2300  vancomycin (VANCOREADY) IVPB 1500 mg/300 mL        1,500 mg 150 mL/hr over 120 Minutes Intravenous  Once 06/13/22 2255 06/14/22 0124   06/13/22 2300  ceFEPIme (MAXIPIME) 2 g in  sodium chloride 0.9 % 100 mL IVPB        2 g 200 mL/hr over 30 Minutes Intravenous  Once 06/13/22 2255 06/15/22 0011        I have personally reviewed the following labs and images: CBC: Recent Labs  Lab 06/13/22 2100 06/14/22 0531 06/15/22 0337  WBC 0.5* 0.7* 0.9*  NEUTROABS 0.2* 0.2* 0.2*  HGB 11.6* 10.0* 9.5*  HCT 35.1* 32.9* 29.4*  MCV 96.7 104.1* 97.7  PLT 47* 43* 35*   BMP &GFR Recent Labs  Lab 06/13/22 2100  06/14/22 0748 06/15/22 0337  NA 139 140 139  K 4.1 4.1 3.7  CL 107 111 110  CO2 $Re'24 22 23  'xGd$ GLUCOSE 100* 93 128*  BUN 41* 35* 33*  CREATININE 2.07* 1.67* 1.40*  CALCIUM 8.8* 8.2* 8.1*  MG  --  1.8 1.9  PHOS  --   --  3.0   Estimated Creatinine Clearance: 46.3 mL/min (A) (by C-G formula based on SCr of 1.4 mg/dL (H)). Liver & Pancreas: Recent Labs  Lab 06/13/22 2100 06/14/22 0748 06/15/22 0337  AST 85* 69* 70*  ALT 46* 36 37  ALKPHOS 157* 123 129*  BILITOT 0.5 0.6 0.5  PROT 6.1* 4.9* 4.9*  ALBUMIN 3.3* 2.7* 2.7*   Recent Labs  Lab 06/13/22 2100  LIPASE 69*   Recent Labs  Lab 06/13/22 2100  AMMONIA 14   Diabetic: No results for input(s): "HGBA1C" in the last 72 hours. Recent Labs  Lab 06/13/22 1949 06/14/22 2226 06/15/22 0200  GLUCAP 106* 86 133*   Cardiac Enzymes: Recent Labs  Lab 06/14/22 0846  CKTOTAL 124   No results for input(s): "PROBNP" in the last 8760 hours. Coagulation Profile: Recent Labs  Lab 06/13/22 2200 06/14/22 0531 06/15/22 0337  INR 3.5* 4.2* 3.7*   Thyroid Function Tests: Recent Labs    06/15/22 0337  TSH 2.271   Lipid Profile: No results for input(s): "CHOL", "HDL", "LDLCALC", "TRIG", "CHOLHDL", "LDLDIRECT" in the last 72 hours. Anemia Panel: Recent Labs    06/15/22 0337  VITAMINB12 3,216*  FOLATE 7.1  FERRITIN >7,500*  TIBC 162*  IRON 71  RETICCTPCT 0.6   Urine analysis:    Component Value Date/Time   COLORURINE YELLOW 06/13/2022 2300   APPEARANCEUR HAZY (A) 06/13/2022 2300    LABSPEC 1.017 06/13/2022 2300   PHURINE 5.0 06/13/2022 2300   GLUCOSEU NEGATIVE 06/13/2022 2300   GLUCOSEU NEGATIVE 08/11/2017 0843   HGBUR MODERATE (A) 06/13/2022 2300   HGBUR negative 03/20/2010 0843   BILIRUBINUR NEGATIVE 06/13/2022 2300   BILIRUBINUR n 03/22/2011 0000   KETONESUR NEGATIVE 06/13/2022 2300   PROTEINUR 100 (A) 06/13/2022 2300   UROBILINOGEN 0.2 08/11/2017 0843   NITRITE NEGATIVE 06/13/2022 2300   LEUKOCYTESUR NEGATIVE 06/13/2022 2300   Sepsis Labs: Invalid input(s): "PROCALCITONIN", "LACTICIDVEN"  Microbiology: Recent Results (from the past 240 hour(s))  SARS Coronavirus 2 by RT PCR (hospital order, performed in [facility] hospital lab) *cepheid single result test* Anterior Nasal Swab     Status: None   Collection Time: 06/13/22  8:26 PM   Specimen: Anterior Nasal Swab  Result Value Ref Range Status   SARS Coronavirus 2 by RT PCR NEGATIVE NEGATIVE Final    Comment: (NOTE) SARS-CoV-2 target nucleic acids are NOT DETECTED.  The SARS-CoV-2 RNA is generally detectable in upper and lower respiratory specimens during the acute phase of infection. The lowest concentration of SARS-CoV-2 viral copies this assay can detect is 250 copies / mL. A negative result does not preclude SARS-CoV-2 infection and should not be used as the sole basis for treatment or other patient management decisions.  A negative result may occur with improper specimen collection / handling, submission of specimen other than nasopharyngeal swab, presence of viral mutation(s) within the areas targeted by this assay, and inadequate number of viral copies (<250 copies / mL). A negative result must be combined with clinical observations, patient history, and epidemiological information.  Fact Sheet for Patients:   https://www.patel.info/  Fact Sheet for Healthcare Providers: https://hall.com/  This test is not yet  approved or  cleared by the [place] and has been authorized for detection and/or diagnosis of SARS-CoV-2 by FDA under an Emergency Use Authorization (EUA).  This EUA will remain in effect (meaning this test can be used) for the duration of the COVID-19 declaration under Section 564(b)(1) of the Act, 21 U.S.C. section 360bbb-3(b)(1), unless the authorization is terminated or revoked sooner.  Performed at [facility], [place] [address], [place], [place] [address]   Culture, blood (Routine X 2) w Reflex to ID Panel     Status: None (Preliminary result)   Collection Time: 06/14/22  8:36 AM   Specimen: BLOOD RIGHT HAND  Result Value Ref Range Status   Specimen Description   Final    BLOOD RIGHT HAND Performed at [facility] [address], [place], [place] [address]    Special Requests   Final    IN PEDIATRIC BOTTLE Blood Culture adequate volume Performed at [facility] [address], [place], [place] [address]    Culture   Final    NO GROWTH < 24 HOURS Performed at [facility] [address] [place], [place] [address]    Report Status PENDING  Incomplete  Culture, blood (Routine X 2) w Reflex to ID Panel     Status: None (Preliminary result)   Collection Time: 06/14/22  8:46 AM   Specimen: BLOOD  Result Value Ref Range Status   Specimen Description   Final    BLOOD BLOOD LEFT FOREARM Performed at [facility] [address] [place], [place] [address]    Special Requests   Final    BOTTLES DRAWN AEROBIC ONLY Blood Culture results may not be optimal due to an inadequate volume of blood received in culture bottles Performed at [facility] [address], [place], [place] [address]    Culture   Final    NO GROWTH < 24 HOURS Performed at [facility] [address] [place], [place] [address]    Report Status PENDING  Incomplete    Radiology Studies: MR LIVER W WO CONTRAST  Result  Date: 06/15/2022 CLINICAL DATA:  Liver lesion. History of chronic lymphocytic leukemia. EXAM: MRI ABDOMEN WITHOUT AND WITH CONTRAST TECHNIQUE: Multiplanar multisequence MR imaging of the abdomen was performed both before and after the administration of intravenous contrast. CONTRAST:  7.86mL GADAVIST GADOBUTROL 1 MMOL/ML IV SOLN COMPARISON:  Abdominal ultrasound 06/14/2022. Abdominopelvic CT 02/09/2022. FINDINGS: Despite efforts by the technologist and patient, moderate to severe motion artifact is present on today's exam and could not be eliminated. This reduces exam sensitivity and specificity. The motion becomes progressively worse throughout the examination, limiting the postcontrast images. Lower chest: Trace right pleural effusion. Status post aortic valve replacement. Hepatobiliary: Liver assessment is limited by the motion, especially on the postcontrast images. Numerous small T2 hyperintense nodules are present throughout the liver with mild restricted diffusion. These are not well seen on the limited postcontrast images. No concerning focal mass lesions are identified. No lesions were apparent in the liver on prior CT. No evidence of gallstones, gallbladder wall thickening or biliary dilatation. Pancreas: Unremarkable. No pancreatic ductal dilatation or surrounding inflammatory changes. Spleen: Stable mild splenomegaly without numerous small nonspecific nodules demonstrating T2 hypointensity. Adrenals/Urinary Tract: Both adrenal glands appear normal. Stable bilateral renal cysts which do not require imaging follow-up. No evidence of enhancing renal mass or hydronephrosis. Stomach/Bowel: The stomach appears unremarkable for its degree of distension.  No evidence of bowel wall thickening, distention or surrounding inflammatory change. Vascular/Lymphatic: Small residual retroperitoneal and mesenteric lymph nodes are not pathologically enlarged. Aortic atherosclerosis, better seen on CT. No evidence of aneurysm  or large vessel occlusion. No significant vascular findings. Other: No evidence of abdominal wall hernia or ascites. Musculoskeletal: No acute or significant osseous findings. Susceptibility artifact in the back corresponding with small metallic foreign bodies on prior imaging. IMPRESSION: 1. Motion limited examination results in suboptimal evaluation of the liver. There are innumerable small T2 hyperintense lesions throughout the liver, but no focal lesions or abnormal enhancement identified on the postcontrast images. Findings may relate to regenerating nodules. Given the limitations of this examination, consider follow-up contrast enhanced CT. 2. Stable mild splenomegaly with scattered small nonspecific splenic nodules, likely benign. 3. No recurrent upper abdominal adenopathy. 4. No acute abdominal findings. Electronically Signed   By: Richardean Sale M.D.   On: 06/15/2022 09:23   MR BRAIN W WO CONTRAST  Result Date: 06/14/2022 CLINICAL DATA:  Altered mental status.  Possible brain metastases. EXAM: MRI HEAD WITHOUT AND WITH CONTRAST TECHNIQUE: Multiplanar, multiecho pulse sequences of the brain and surrounding structures were obtained without and with intravenous contrast. CONTRAST:  7.38mL GADAVIST GADOBUTROL 1 MMOL/ML IV SOLN COMPARISON:  09/02/2017 FINDINGS: Brain: No acute infarct, mass effect or extra-axial collection. No acute or chronic hemorrhage. There is multifocal hyperintense T2-weighted signal within the white matter. Generalized volume loss. There are old bilateral cerebellar small vessel infarcts. There is no abnormal contrast enhancement. Vascular: Major flow voids are preserved. Skull and upper cervical spine: Normal calvarium and skull base. Visualized upper cervical spine and soft tissues are normal. Sinuses/Orbits:No paranasal sinus fluid levels or advanced mucosal thickening. No mastoid or middle ear effusion. Normal orbits. IMPRESSION: 1. No intracranial metastatic disease. 2. Chronic  small vessel ischemia and volume loss. Electronically Signed   By: Ulyses Jarred M.D.   On: 06/14/2022 22:45   ECHOCARDIOGRAM COMPLETE  Result Date: 06/14/2022    ECHOCARDIOGRAM REPORT   Patient Name:   DAYNA GEURTS Date of Exam: 06/14/2022 Medical Rec #:  [id number]      Height:       70.0 in Accession #:    [id number]     Weight:       170.0 lb Date of Birth:  Sep 03, 1946      BSA:          1.948 m Patient Age:    84 years       BP:           93/58 mmHg Patient Gender: M              HR:           72 bpm. Exam Location:  Inpatient Procedure: 2D Echo, Color Doppler and Cardiac Doppler Indications:    R06.9 DOE  History:        Patient has prior history of Echocardiogram examinations, most                 recent 07/13/2021. Arrythmias:Atrial Fibrillation; Risk                 Factors:Hypertension and Dyslipidemia. Aortic Valve Replaced in                 2011.  Sonographer:    Raquel Sarna Senior RDCS Referring Phys: [place]  1. Left ventricular ejection fraction, by estimation, is 50%. The left ventricle has mildly decreased function. The  left ventricle demonstrates global hypokinesis. There is mild left ventricular hypertrophy. Left ventricular diastolic parameters are consistent with Grade II diastolic dysfunction (pseudonormalization).  2. Right ventricular systolic function is normal. The right ventricular size is normal. There is normal pulmonary artery systolic pressure. The estimated right ventricular systolic pressure is 57.0 mmHg.  3. Left atrial size was moderately dilated.  4. Right atrial size was mildly dilated.  5. The mitral valve is normal in structure. Trivial mitral valve regurgitation. No evidence of mitral stenosis.  6. Bioprosthetic aortic valve present, the valve appears thickened/calcified. Trivial aortic insufficiency. Mean gradient elevated at 47 mmHg suggesting severe stenosis. Dimensionless index 0.25, AVA 0.87 cm^2.  7. Aortic dilatation noted. There is mild dilatation of the  aortic root, measuring 39 mm.  8. The inferior vena cava is normal in size with greater than 50% respiratory variability, suggesting right atrial pressure of 3 mmHg. FINDINGS  Left Ventricle: Left ventricular ejection fraction, by estimation, is 50%. The left ventricle has mildly decreased function. The left ventricle demonstrates global hypokinesis. The left ventricular internal cavity size was normal in size. There is mild left ventricular hypertrophy. Left ventricular diastolic parameters are consistent with Grade II diastolic dysfunction (pseudonormalization). Right Ventricle: The right ventricular size is normal. No increase in right ventricular wall thickness. Right ventricular systolic function is normal. There is normal pulmonary artery systolic pressure. The tricuspid regurgitant velocity is 2.71 m/s, and  with an assumed right atrial pressure of 3 mmHg, the estimated right ventricular systolic pressure is 17.7 mmHg. Left Atrium: Left atrial size was moderately dilated. Right Atrium: Right atrial size was mildly dilated. Pericardium: There is no evidence of pericardial effusion. Mitral Valve: The mitral valve is normal in structure. Trivial mitral valve regurgitation. No evidence of mitral valve stenosis. Tricuspid Valve: The tricuspid valve is normal in structure. Tricuspid valve regurgitation is mild. Aortic Valve: Bioprosthetic aortic valve present, the valve appears thickened/calcified. Trivial aortic insufficiency. Mean gradient elevated at 47 mmHg suggesting severe stenosis. Dimensionless index 0.25, AVA 0.87 cm^2. The aortic valve has been repaired/replaced. Aortic valve regurgitation is trivial. Aortic valve mean gradient measures 47.0 mmHg. Aortic valve peak gradient measures 71.6 mmHg. Aortic valve area, by VTI measures 0.88 cm. Pulmonic Valve: The pulmonic valve was normal in structure. Pulmonic valve regurgitation is trivial. Aorta: Aortic dilatation noted. There is mild dilatation of the  aortic root, measuring 39 mm. Venous: The inferior vena cava is normal in size with greater than 50% respiratory variability, suggesting right atrial pressure of 3 mmHg. IAS/Shunts: No atrial level shunt detected by color flow Doppler.  LEFT VENTRICLE PLAX 2D LVIDd:         5.20 cm   Diastology LVIDs:         3.80 cm   LV e' medial:    5.00 cm/s LV PW:         1.10 cm   LV E/e' medial:  13.2 LV IVS:        1.10 cm   LV e' lateral:   9.79 cm/s LVOT diam:     2.10 cm   LV E/e' lateral: 6.7 LV SV:         86 LV SV Index:   44 LVOT Area:     3.46 cm  RIGHT VENTRICLE RV S prime:     11.60 cm/s TAPSE (M-mode): 1.5 cm LEFT ATRIUM              Index  RIGHT ATRIUM           Index LA diam:        3.80 cm  1.95 cm/m   RA Area:     18.10 cm LA Vol (A2C):   101.0 ml 51.85 ml/m  RA Volume:   46.50 ml  23.87 ml/m LA Vol (A4C):   101.0 ml 51.85 ml/m LA Biplane Vol: 103.0 ml 52.88 ml/m  AORTIC VALVE AV Area (Vmax):    0.86 cm AV Area (Vmean):   0.85 cm AV Area (VTI):     0.88 cm AV Vmax:           423.00 cm/s AV Vmean:          328.000 cm/s AV VTI:            0.978 m AV Peak Grad:      71.6 mmHg AV Mean Grad:      47.0 mmHg LVOT Vmax:         105.00 cm/s LVOT Vmean:        80.750 cm/s LVOT VTI:          0.250 m LVOT/AV VTI ratio: 0.26  AORTA Ao Root diam: 3.90 cm Ao Asc diam:  3.60 cm MITRAL VALVE               TRICUSPID VALVE MV Area (PHT): 2.87 cm    TR Peak grad:   29.4 mmHg MV Decel Time: 264 msec    TR Vmax:        271.00 cm/s MV E velocity: 66.00 cm/s MV A velocity: 41.10 cm/s  SHUNTS MV E/A ratio:  1.61        Systemic VTI:  0.25 m                            Systemic Diam: 2.10 cm Dalton McleanMD Electronically signed by Franki Monte Signature Date/Time: 06/14/2022/3:32:16 PM    Final       Dietrich Ke T. [place]  If 7PM-7AM, please contact night-coverage [email or web address] 06/15/2022, 12:49 PM

## 2022-06-15 NOTE — Progress Notes (Signed)
I am still not sure as to what exactly is going on with [name].  As I should expect, Interventional Radiology did a fantastic job and actually did the bone marrow test yesterday.  Because of this, I will now try him on some Neupogen to try to get his white cell count up.  He underwent the ultrasound of the liver.  There was a spot on the liver.  He then went for a MRI of the liver.  The results not back yet.  However, it is hard to say whether or not there was any type of "seizure" that he had with the MRI.  He had an MRI of the brain which was unremarkable.  There is still something neurologically going on with him.  He just is not the same.  He has some tremors.  He has a little bit of confusion.  I think that he may need to think about a spinal tap.  The problem with this is that he has the thrombocytopenia and he also has Coumadin on board.  I think that Neurology input may be helpful.  He did have an echocardiogram yesterday.  His ejection fraction is 50%.  There is severe aortic stenosis.  This may be an issue.  I know that Dr. [name] of Cardiology has been following him.  I think that he is thought about having the aortic valve replaced.  I do not know if this may be an issue right now.  He is not eating all that much.  I will try him on some Megace elixir to see if this may help a little bit.  His labs show white cell count 0.9.  Hemoglobin 9.5.  Platelet count 35,000.  His SGPT is 37 SGOT 70.  His BUN is 33 creatinine 1.4.  Thankfully, his renal function is improving with IV fluid.  He has had no fever.  Cultures are still pending but I suspect these will be negative.  I will know if he has some kind of viral syndrome that may be triggering all of this.  I do not know if there is any way to really know if this is the case.  He has had no bleeding.  He has had no diarrhea.  There has been no rashes.  His vital signs show temperature of 98.  Pulse 86.  Blood pressure 105/66.  His head  neck exam is unremarkable.  There is no adenopathy.  Lungs are clear bilaterally.  Cardiac exam regular rate and rhythm.  He has a murmur from his aortic valve.  Abdomen is soft.  He has good bowel sounds.  There is no fluid wave.  There is no palpable liver or spleen tip.  Extremity shows some tremors.  He has little bit of weakness diffusely.  He has good range of motion of his joints.  Neurological exam shows some slight confusion.  He is pleasant.  He initially was not sure who I was.  Again, I am not sure exactly what is going on with Mr. Luhman.  I am glad the bone marrow test was done so we could get him on some Neupogen.  This will help his white cells.  There is still something neurologically going on.  Again I would think that a spinal tap would be helpful but I just do not know if this will be done because of his platelet count and because of his Coumadin.  He has a aortic stenosis which is severe.  This could  also be an issue with his energy level.  Hopefully, we will have a preliminary reading on the bone marrow report on Monday.  I do appreciate the incredible care he is getting from everybody on [place], MD  [facility] 1:37

## 2022-06-16 DIAGNOSIS — N179 Acute kidney failure, unspecified: Secondary | ICD-10-CM | POA: Diagnosis not present

## 2022-06-16 DIAGNOSIS — C9110 Chronic lymphocytic leukemia of B-cell type not having achieved remission: Secondary | ICD-10-CM | POA: Diagnosis not present

## 2022-06-16 DIAGNOSIS — I5032 Chronic diastolic (congestive) heart failure: Secondary | ICD-10-CM

## 2022-06-16 DIAGNOSIS — I951 Orthostatic hypotension: Secondary | ICD-10-CM | POA: Diagnosis not present

## 2022-06-16 DIAGNOSIS — K769 Liver disease, unspecified: Secondary | ICD-10-CM | POA: Diagnosis not present

## 2022-06-16 DIAGNOSIS — Z86711 Personal history of pulmonary embolism: Secondary | ICD-10-CM | POA: Diagnosis not present

## 2022-06-16 LAB — CBC WITH DIFFERENTIAL/PLATELET
Abs Immature Granulocytes: 0.04 10*3/uL (ref 0.00–0.07)
Basophils Absolute: 0.1 10*3/uL (ref 0.0–0.1)
Basophils Relative: 1 %
Eosinophils Absolute: 0 10*3/uL (ref 0.0–0.5)
Eosinophils Relative: 0 %
HCT: 28.6 % — ABNORMAL LOW (ref 39.0–52.0)
Hemoglobin: 9.2 g/dL — ABNORMAL LOW (ref 13.0–17.0)
Immature Granulocytes: 1 %
Lymphocytes Relative: 37 %
Lymphs Abs: 1.8 10*3/uL (ref 0.7–4.0)
MCH: 31.5 pg (ref 26.0–34.0)
MCHC: 32.2 g/dL (ref 30.0–36.0)
MCV: 97.9 fL (ref 80.0–100.0)
Monocytes Absolute: 0.1 10*3/uL (ref 0.1–1.0)
Monocytes Relative: 2 %
Neutro Abs: 2.9 10*3/uL (ref 1.7–7.7)
Neutrophils Relative %: 59 %
Platelets: 33 10*3/uL — ABNORMAL LOW (ref 150–400)
RBC: 2.92 MIL/uL — ABNORMAL LOW (ref 4.22–5.81)
RDW: 13.9 % (ref 11.5–15.5)
WBC: 4.9 10*3/uL (ref 4.0–10.5)
nRBC: 0 % (ref 0.0–0.2)

## 2022-06-16 LAB — COMPREHENSIVE METABOLIC PANEL
ALT: 32 U/L (ref 0–44)
AST: 53 U/L — ABNORMAL HIGH (ref 15–41)
Albumin: 2.4 g/dL — ABNORMAL LOW (ref 3.5–5.0)
Alkaline Phosphatase: 112 U/L (ref 38–126)
Anion gap: 4 — ABNORMAL LOW (ref 5–15)
BUN: 24 mg/dL — ABNORMAL HIGH (ref 8–23)
CO2: 22 mmol/L (ref 22–32)
Calcium: 7.9 mg/dL — ABNORMAL LOW (ref 8.9–10.3)
Chloride: 114 mmol/L — ABNORMAL HIGH (ref 98–111)
Creatinine, Ser: 1.23 mg/dL (ref 0.61–1.24)
GFR, Estimated: >60 mL/min (ref >60)
Glucose, Bld: 106 mg/dL — ABNORMAL HIGH (ref 70–99)
Potassium: 3.8 mmol/L (ref 3.5–5.1)
Sodium: 140 mmol/L (ref 135–145)
Total Bilirubin: 0.6 mg/dL (ref 0.3–1.2)
Total Protein: 4.5 g/dL — ABNORMAL LOW (ref 6.5–8.1)

## 2022-06-16 LAB — MAGNESIUM: Magnesium: 1.7 mg/dL (ref 1.7–2.4)

## 2022-06-16 LAB — PROTIME-INR
INR: 2 — ABNORMAL HIGH (ref 0.8–1.2)
Prothrombin Time: 22.7 seconds — ABNORMAL HIGH (ref 11.4–15.2)

## 2022-06-16 LAB — IGG, IGA, IGM
IgA: 46 mg/dL — ABNORMAL LOW (ref 61–437)
IgG (Immunoglobin G), Serum: 438 mg/dL — ABNORMAL LOW (ref 603–1613)
IgM (Immunoglobulin M), Srm: 23 mg/dL (ref 15–143)

## 2022-06-16 MED ORDER — WARFARIN SODIUM 5 MG PO TABS
10.0000 mg | ORAL_TABLET | Freq: Once | ORAL | Status: AC
  Administered 2022-06-16: 10 mg via ORAL
  Filled 2022-06-16: qty 2

## 2022-06-16 MED ORDER — MIDODRINE HCL 5 MG PO TABS
2.5000 mg | ORAL_TABLET | Freq: Three times a day (TID) | ORAL | Status: DC
Start: 2022-06-16 — End: 2022-06-20
  Administered 2022-06-16 – 2022-06-20 (×13): 2.5 mg via ORAL
  Filled 2022-06-16 (×14): qty 1

## 2022-06-16 NOTE — NC FL2 (Signed)
[place] LEVEL OF CARE SCREENING TOOL     IDENTIFICATION  Patient Name: [name] Birthdate: 09-11-1946 Sex: male Admission Date (Current Location): 06/13/2022  [facility] and [place] Number:  Herbalist and Address:  [facility] [place],  [place], [place]      Provider Number: [id number]  Attending Physician Name and Address:  Mercy Riding, MD  Relative Name and Phone Number:  Kelvon, Giannini Niece [phone]    Balboa,ruby Niece   [phone]    Current Level of Care: Hospital Recommended Level of Care: [place] Prior Approval Number:    Date Approved/Denied:   PASRR Number: [id number]  Discharge Plan: SNF    Current Diagnoses: Patient Active Problem List   Diagnosis Date Noted   Severe aortic stenosis 06/15/2022   Chronic diastolic CHF (congestive heart failure) ([place]) 06/15/2022   Seizure-like activity ([place]) 06/15/2022   Orthostatic hypotension 06/15/2022   Altered mental status 06/14/2022   Acute renal failure superimposed on stage 3b chronic kidney disease ([place]) 06/14/2022   Transaminitis 06/14/2022   Liver lesion, right lobe 06/14/2022   AMS (altered mental status) 06/14/2022   IDA (iron deficiency anemia) 10/08/2021   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/2021   Aortic atherosclerosis ([place]) 07/25/2021   Neutropenia ([place])    Pancytopenia ([place])    Encounter for therapeutic drug monitoring 09/21/2019   PTSD (post-traumatic stress disorder) 09/29/2018   BPPV (benign paroxysmal positional vertigo) 09/04/2017   Stage 3b chronic kidney disease (CKD) ([place]) 08/11/2017   Former smoker 08/29/2016   Cervical spondylosis    S/P Bioprosthetic AVR (aortic valve replacement) in 2011 03/22/2015   History of pulmonary embolism 03/07/2015   Recurrent pulmonary embolism ([place]) 11/09/2014   Lung nodule 11/02/2014   Chronic lymphocytic leukemia ([place]) 11/11/2013   Migraine 10/08/2010   GERD 12/11/2009    MITRAL REGURGITATION 04/07/2009   Insomnia 02/03/2008   Anxiety state 09/06/2007   HLD (hyperlipidemia) 04/20/2007   Essential hypertension 04/20/2007   Paroxysmal atrial fibrillation ([place]) 04/20/2007   PROSTATE CANCER, HX OF 04/20/2007    Orientation RESPIRATION BLADDER Height & Weight     Self, Time, Situation, Place  Normal Continent Weight: 171 lb (77.6 kg) Height:  '5\' 10"'$  (177.8 cm)  BEHAVIORAL SYMPTOMS/MOOD NEUROLOGICAL BOWEL NUTRITION STATUS      Continent Diet (Regular)  AMBULATORY STATUS COMMUNICATION OF NEEDS Skin   Limited Assist Verbally Normal                       Personal Care Assistance Level of Assistance  Bathing, Feeding, Dressing Bathing Assistance: Limited assistance Feeding assistance: Independent Dressing Assistance: Limited assistance     Functional Limitations Info  Sight, Hearing, Speech Sight Info: Adequate Hearing Info: Adequate Speech Info: Adequate    SPECIAL CARE FACTORS FREQUENCY  PT (By licensed PT), OT (By licensed OT)     PT Frequency: Minimum 5x a week OT Frequency: Minimum 5x a week            Contractures Contractures Info: Not present    Additional Factors Info  Code Status, Allergies, Psychotropic Code Status Info: Full Code Allergies Info: Hydrocodone-acetaminophen   Oxycodone Hcl   Telmisartan-hctz   Aspirin   Atorvastatin   Buspirone Hcl   Codeine   Morphine Sulfate   Paroxetine   Rabeprazole   Ramipril Psychotropic Info: sertraline (ZOLOFT) tablet 100 mg         Current Medications (06/16/2022):  This is the  current hospital active medication list Current Facility-Administered Medications  Medication Dose Route Frequency Provider Last Rate Last Admin   acetaminophen (TYLENOL) tablet 650 mg  650 mg Oral Q6H PRN Kristopher Oppenheim, DO       acyclovir (ZOVIRAX) tablet 400 mg  400 mg Oral Daily Kristopher Oppenheim, DO   400 mg at 06/16/22 6837   ceFEPIme (MAXIPIME) 2 g in sodium chloride 0.9 % 100 mL IVPB  2 g Intravenous Q12H  Kristopher Oppenheim, DO   Stopped at 06/16/22 0245   feeding supplement (ENSURE ENLIVE / ENSURE PLUS) liquid 237 mL  237 mL Oral TID BM Wendee Beavers T, MD   237 mL at 06/15/22 0817   megestrol (MEGACE) 400 MG/10ML suspension 400 mg  400 mg Oral BID Volanda Napoleon, MD   400 mg at 06/16/22 0827   metroNIDAZOLE (FLAGYL) IVPB 500 mg  500 mg Intravenous Q12H Kristopher Oppenheim, DO 100 mL/hr at 06/16/22 1506 500 mg at 06/16/22 1506   midodrine (PROAMATINE) tablet 2.5 mg  2.5 mg Oral TID WC Minus Breeding, MD   2.5 mg at 06/16/22 1212   multivitamin with minerals tablet 1 tablet  1 tablet Oral Daily Wendee Beavers T, MD   1 tablet at 06/16/22 0826   ondansetron (ZOFRAN) tablet 4 mg  4 mg Oral Q6H PRN Kristopher Oppenheim, DO       Or   ondansetron [facility]) injection 4 mg  4 mg Intravenous Q6H PRN Kristopher Oppenheim, DO       pantoprazole (PROTONIX) EC tablet 40 mg  40 mg Oral Daily Kristopher Oppenheim, DO   40 mg at 06/16/22 2902   rosuvastatin (CRESTOR) tablet 5 mg  5 mg Oral Daily Kristopher Oppenheim, DO   5 mg at 06/16/22 1115   sertraline (ZOLOFT) tablet 100 mg  100 mg Oral q morning Kristopher Oppenheim, DO   100 mg at 06/16/22 5208   Tbo-Filgrastim (GRANIX) injection 480 mcg  480 mcg Subcutaneous q1800 Volanda Napoleon, MD   480 mcg at 06/15/22 1755   vancomycin (VANCOCIN) IVPB 1000 mg/200 mL premix  1,000 mg Intravenous Q24H Emiliano Dyer, [facility]   Stopped at 06/15/22 2249   Warfarin - Pharmacist Dosing Inpatient   Does not apply [id number] Nilda Simmer, [facility]         Discharge Medications: Please see discharge summary for a list of discharge medications.  Relevant Imaging Results:  Relevant Lab Results:   Additional Information SSN [id number]  Ross Ludwig, LCSW

## 2022-06-16 NOTE — TOC Initial Note (Addendum)
Transition of Care [facility]) - Initial/Assessment Note    Patient Details  Name: [name] MRN: [id number] Date of Birth: November 17, 1945  [facility] of [facility]) CM/SW Contact:    Ross Ludwig, LCSW Phone Number: 06/16/2022, 5:22 PM  Clinical Narrative:                  Patient is a 76 year old male who is alert and oriented x4.  Patient has [facility] Medicare and VA benefits for SNF.  Patient lives along, and plans to go to SNF for short term rehab before returning back home.  CSW spoke to patient's niece Rod Holler who he is deferring to help with decision making.  CSW informed patient's niece that he will have bettter SNF benefits by using the [facility] Medicare, and she is agreeable.   PT worked with him and recommending SNF.  CSW spoke to patient's niece and explained the process and the steps on how to get him approved for SNF placement.  Patient's niece would like to be notified of bed choices to help him make a decision for SNF placement once bed offers are available.    Patient's niece requested that she be emailed bed offers to her email address which is [email or web address] her phone number is [phone].  Patient's niece is from out of town, but will help him with decision making.    Patient's niece gave permission to begin bed search in [facility].  Expected Discharge Plan: Skilled Nursing Facility Barriers to Discharge: Insurance Authorization, Continued Medical Work up   Patient Goals and CMS Choice Patient states their goals for this hospitalization and ongoing recovery are:: To go to SNF for rehab, then return back home. CMS Medicare.gov Compare Post Acute Care list provided to:: Patient Represenative (must comment) Choice offered to / list presented to : [place] / Guardian  Expected Discharge Plan and Services Expected Discharge Plan: [place] Choice: [place] arrangements for the past 2 months: Single Family  Home                                      Prior Living Arrangements/Services Living arrangements for the past 2 months: Single Family Home Lives with:: Self Patient language and need for interpreter reviewed:: Yes Do you feel safe going back to the place where you live?: No   Patient feel he needs some short term rehab, before returning back home.  Need for Family Participation in Patient Care: Yes (Comment) Care giver support system in place?: No (comment)   Criminal Activity/Legal Involvement Pertinent to Current Situation/Hospitalization: No - Comment as needed  Activities of Daily Living Home Assistive Devices/Equipment: None ADL Screening (condition at time of admission) Patient's cognitive ability adequate to safely complete daily activities?: Yes Is the patient deaf or have difficulty hearing?: No Does the patient have difficulty seeing, even when wearing glasses/contacts?: No Does the patient have difficulty concentrating, remembering, or making decisions?: No Patient able to express need for assistance with ADLs?: Yes Does the patient have difficulty dressing or bathing?: Yes Independently performs ADLs?: Yes (appropriate for developmental age) Does the patient have difficulty walking or climbing stairs?: Yes Weakness of Legs: Both Weakness of Arms/Hands: Both  Permission Sought/Granted Permission sought to share information with : Case Manager, Family Supports, Chartered certified accountant granted to share information with : Yes, Verbal  Permission Granted  Share Information with NAME: [place]-Allen,[name] Niece [phone]    Rokosz,ruby Niece   [phone]  Permission granted to share info w AGENCY: SNF admissions        Emotional Assessment Appearance:: Appears stated age   Affect (typically observed): Accepting, Appropriate, Calm, Pleasant, Stable Orientation: : Oriented to Self, Oriented to Place, Oriented to  Time, Oriented to  Situation Alcohol / Substance Use: Not Applicable Psych Involvement: No (comment)  Admission diagnosis:  Altered mental status [R41.82] Altered mental status, unspecified altered mental status type [R41.82] AMS (altered mental status) [R41.82] Patient Active Problem List   Diagnosis Date Noted   Severe aortic stenosis 06/15/2022   Chronic diastolic CHF (congestive heart failure) ([place]) 06/15/2022   Seizure-like activity ([place]) 06/15/2022   Orthostatic hypotension 06/15/2022   Altered mental status 06/14/2022   Acute renal failure superimposed on stage 3b chronic kidney disease ([place]) 06/14/2022   Transaminitis 06/14/2022   Liver lesion, right lobe 06/14/2022   AMS (altered mental status) 06/14/2022   IDA (iron deficiency anemia) 10/08/2021   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) 07/25/2021   Neutropenia ([place])    Pancytopenia ([place])    Encounter for therapeutic drug monitoring 09/21/2019   PTSD (post-traumatic stress disorder) 09/29/2018   BPPV (benign paroxysmal positional vertigo) 09/04/2017   Stage 3b chronic kidney disease (CKD) ([place]) 08/11/2017   Former smoker 08/29/2016   Cervical spondylosis    S/P Bioprosthetic AVR (aortic valve replacement) in 2011 03/22/2015   History of pulmonary embolism 03/07/2015   Recurrent pulmonary embolism ([place]) 11/09/2014   Lung nodule 11/02/2014   Chronic lymphocytic leukemia ([place]) 11/11/2013   Migraine 10/08/2010   GERD 12/11/2009   MITRAL REGURGITATION 04/07/2009   Insomnia 02/03/2008   Anxiety state 09/06/2007   HLD (hyperlipidemia) 04/20/2007   Essential hypertension 04/20/2007   Paroxysmal atrial fibrillation ([place]) 04/20/2007   PROSTATE CANCER, HX OF 04/20/2007   PCP:  [name], MD Pharmacy:   [id number] [place] [place] [id number]: [phone]: [phone]    Social  Determinants of Health (SDOH) Interventions    Readmission Risk Interventions    06/16/2022    5:17 PM  Readmission Risk Prevention Plan  Transportation Screening Complete  PCP or Specialist Appt within 3-5 Days Complete  [place] or [place]  Social Work Consult for [place]Counseling Complete  Palliative Care Screening Not Applicable  Medication Review (Press photographer Referral to Pharmacy

## 2022-06-16 NOTE — Progress Notes (Signed)
PROGRESS NOTE  [name] ZOX:[id number] DOB: January 18, 1946   PCP: [name], MD  Patient is from: Home.  Lives alone.  Independently ambulates at baseline.  DOA: 06/13/2022 LOS: 2  Chief complaints Chief Complaint  Patient presents with   Weakness   chemo patient     Brief Narrative / Interim history: 76 year old M with PMH of prostate cancer s/p prostatectomy, CLL, bioprosthetic AVR, recurrent PE on Coumadin, PAF and CKD-3B brought to ED after found down on his knees and hands by his friend.  Patient denied fall but fatigue.  No signs of serious injury.  In ED, mild temp to 99.6.  Otherwise vital stable.  WBC 0.5 with ANC of 200.  Hgb 11.6.  Platelet 47 (baseline in the 90s).  Cr 2.0 (from 1.5 about a months ago).  AST 85.  ALT 46.  Total bili 0.5.  ALP 157.  Lactic acid normal.  Troponin 63.  INR 3.5.  UA with moderate Hgb.  CT head and CXR without acute finding.  EKG sinus rhythm with PACs.  Oncology consulted.  Patient was started on broad-spectrum antibiotics and admitted for altered mental status, AKI and neutropenia.  TTE ordered.  The next day, AKI and LFT improved.  RUQ [name] with 8 mm right hepatic lobe lesion.  He underwent bone marrow biopsy.  MRI liver, MRI brain and blood cultures ordered.  Remains on broad-spectrum antibiotic for neutropenia.   MRI brain without acute or significant finding.  MRI liver was motion limited and showed innumerable small T2 hyperintense lesions throughout the liver suggesting regenerating nodules but no focal lesion or abnormal enhancement.  Follow-up contrast-enhanced CT was recommended.    Patient had seizure-like activity while getting an MRI.  EEG ordered.  Neurology consulted but symptoms felt to be from orthostatic hypotension versus seizure.  Orthostatic vitals were positive.   TTE showed LVEF of 50%, GH, G2-DD, RVSP of 32.4, moderate LAE, severe bioprosthetic aortic valve stenosis.  Cardiology consulted and recommended follow-up  with his cardiologist.   Therapy recommended SNF.  Subjective: Seen and examined earlier this morning.  No major events overnight of this morning.  He was lightheaded with orthostatic blood pressure yesterday.  No complaint this morning.  Patient's niece at bedside  Objective: Vitals:   06/15/22 0900 06/15/22 1421 06/15/22 2312 06/16/22 0516  BP: 103/60 (!) 97/52 (!) 99/58 103/64  Pulse: 65 64 66 64  Resp: $Remo'16 20 18 18  'mPbJS$ Temp: 97.7 F (36.5 C) (!) 97.5 F (36.4 C) 98.2 F (36.8 C) 97.9 F (36.6 C)  TempSrc: Oral Oral Oral Oral  SpO2: 97% 99% 98% 98%  Weight:      Height:        Examination:  GENERAL: No apparent distress.  Nontoxic. HEENT: MMM.  Vision and hearing grossly intact.  NECK: Supple.  No apparent JVD.  RESP:  No IWOB.  Fair aeration bilaterally. CVS:  RRR.  2/6 SEM over RUSB.  Mechanical heart sounds. ABD/GI/GU: BS+. Abd soft, NTND.  MSK/EXT:  Moves extremities. No apparent deformity. No edema.  SKIN: no apparent skin lesion or wound NEURO: Awake and alert. Oriented x4.  Limited insight and recall.  No apparent focal neuro deficit. PSYCH: Calm. Normal affect.   Procedures:  8/4-bone marrow biopsy  Microbiology summarized: COVID-19 PCR negative. Blood culture NGTD.  Assessment and plan: Principal Problem:   Altered mental status Active Problems:   Neutropenia ([place])   Acute renal failure superimposed on stage 3b chronic kidney disease ([place])  Chronic lymphocytic leukemia ([facility])   S/P Bioprosthetic AVR (aortic valve replacement) in 2011   History of pulmonary embolism   Pancytopenia (H[facility])   Transaminitis   Liver lesion, right lobe   AMS (altered mental status)   Severe aortic stenosis   Chronic diastolic CHF (congestive heart failure) (H[facility])   Seizure-like activity ([facility])   Orthostatic hypotension  Altered mental status: Unclear how long this has been going on.  Looks like cognitive decline.  He has orthostatic hypotension as well.  Per patient's aunt,  his cognition seems to be the same as when she saw him 5 months ago.  Has no focal neurodeficit.  He is oriented x4 but limited insight and recalls.  CT head and MRI without acute finding.  He has no focal neuro symptoms.  No signs of meningism.  CT head, MRI brain, ammonia, B12 and TSH unrevealing.  He has history of CLL.  Also had seizure-like activity while getting MRI -Ordered EEG.  Neurology consulted. -Follow [place] serologies. -Reorientation and delirium precautions -Continue broad-spectrum antibiotics  Seizure-like activity-Reportedly had shaking and urinary incontinence while getting MRI. felt to be from orthostatic hypotension versus seizure.   -Follow EEG -Appreciate input by neurology  Severe aortic valve stenosis inpatient with Bioprosthetic AVR-noted on TTE.  Per aunt, DOE and "tremors" with exertion.  He might be having DOE and presyncope.  However, cardiology felt his TTE finding are stable compared to his TTE in August 2022.  Patient is followed by Dr. [name] outpatient. -Appreciate input by cardiology -No further inpatient evaluation but outpatient follow-up for valve in valve TAVR consideration  Orthostatic hypotension: Probably contributing to his tremors with activity and exertion and DOE.  Orthostatic vitals positive.  TTE as above.  TSH within normal.  This could be due to aortic stenosis and a/or autonomic dysfunction. -Appreciate help by cardiology-compression garments, abdominal binder and midodrine -Elevate head of bed to at least 30 degree -Check a.m. cortisol -Fall precaution -Therapy recommended SNF.  AKI on CKD-3A: Likely prerenal.  Improving with IV fluid.  Not on nephrotoxic meds.  AKI resolved. Recent Labs    01/31/22 0831 02/25/22 1420 03/28/22 0855 04/10/22 1049 05/08/22 1007 05/22/22 0901 06/13/22 2100 06/14/22 0748 06/15/22 0337 06/16/22 0450  BUN 21 28* 27* 24* 27* 32* 41* 35* 33* 24*  CREATININE 1.28* 1.79* 1.29* 1.27* 1.28* 1.53* 2.07* 1.67*  1.40* 1.23  -Monitor off IV fluid -Monitor  Neutropenia/pancytopenia/CLL: Reportedly has not had chemotherapy in 2 months.  Started on Granix on 8/5.  Leukopenia/neutropenia resolved.  Anemia stable.  Thrombocytopenia slightly worse.  Blood cultures negative. -Not sure if he needs broad-spectrum antibiotics at this time.  We will wait until oncology weighs in. -Continue home acyclovir -Follow bone marrow biopsy result -Oncology following.  Liver lesion/mild transaminitis: LFT improved.  Colonoscopy in 2014 with mild diverticulosis, and no mentions of polyps. MRI liver was motion limited and showed innumerable small T2 hyperintense lesions throughout the liver suggesting regenerating nodules but no focal lesion or abnormal enhancement.  AFP and CEA negative. -Follow-up contrast-enhanced CT was recommended.   -Continue monitoring  History of pulmonary embolism/supratherapeutic INR: INR 2.0. -Warfarin per pharmacy  Paroxysmal A-fib: Currently in sinus rhythm.  Not on rate or rhythm control -Warfarin per pharmacy  Chronic diastolic CHF: TTE with LVEF of 50%, GH, G2-DD and severe aortic stenosis.  Appears euvolemic on exam.  Not on diuretics at home.  Denies dyspnea or chest pain. -Discontinue IV fluid -Monitor fluid status   Generalized weakness/fatigue: Independently  ambulates at baseline. -PT/OT-recommended SNF.   Decreased oral intake Body mass index is 24.54 kg/m. Nutrition Problem: Inadequate oral intake Etiology: decreased appetite Signs/Symptoms: per patient/family report Interventions: Ensure Enlive (each supplement provides 350kcal and 20 grams of protein), MVI   DVT prophylaxis:  Place TED hose Start: 06/15/22 1256 SCDs Start: 06/14/22 0347 in the setting of thrombocytopenia and supratherapeutic INR warfarin (COUMADIN) tablet 10 mg  Code Status: Full code Family Communication: Updated patient's niece at bedside. Level of care: Telemetry Status is: Inpatient The  patient will remain inpatient because: Orthostatic hypotension   Final disposition: SNF Consultants:  Oncology Neurology-signed off Cardiology  Sch Meds:  Scheduled Meds:  acyclovir  400 mg Oral Daily   feeding supplement  237 mL Oral TID BM   megestrol  400 mg Oral BID   midodrine  2.5 mg Oral TID WC   multivitamin with minerals  1 tablet Oral Daily   pantoprazole  40 mg Oral Daily   rosuvastatin  5 mg Oral Daily   sertraline  100 mg Oral q morning   Tbo-Filgrastim  480 mcg Subcutaneous q1800   warfarin  10 mg Oral ONCE-1600   Warfarin - Pharmacist Dosing Inpatient   Does not apply q1600   Continuous Infusions:  sodium chloride 75 mL/hr at 06/16/22 0555   ceFEPime (MAXIPIME) IV Stopped (06/16/22 0245)   metronidazole Stopped (06/16/22 0217)   vancomycin Stopped (06/15/22 2249)   PRN Meds:.acetaminophen **OR** [DISCONTINUED] acetaminophen, ondansetron **OR** ondansetron (ZOFRAN) IV  Antimicrobials: Anti-infectives (From admission, onward)    Start     Dose/Rate Route Frequency Ordered Stop   06/15/22 1200  vancomycin (VANCOREADY) IVPB 1250 mg/250 mL  Status:  Discontinued        1,250 mg 166.7 mL/hr over 90 Minutes Intravenous Every 36 hours 06/14/22 0228 06/14/22 0959   06/14/22 2200  vancomycin (VANCOCIN) IVPB 1000 mg/200 mL premix        1,000 mg 200 mL/hr over 60 Minutes Intravenous Every 24 hours 06/14/22 0959     06/14/22 1500  ceFEPIme (MAXIPIME) 2 g in sodium chloride 0.9 % 100 mL IVPB        2 g 200 mL/hr over 30 Minutes Intravenous Every 12 hours 06/14/22 0220     06/14/22 1000  acyclovir (ZOVIRAX) tablet 400 mg        400 mg Oral Daily 06/14/22 0346     06/14/22 0230  metroNIDAZOLE (FLAGYL) IVPB 500 mg        500 mg 100 mL/hr over 60 Minutes Intravenous Every 12 hours 06/14/22 0206     06/13/22 2300  vancomycin (VANCOREADY) IVPB 1500 mg/300 mL        1,500 mg 150 mL/hr over 120 Minutes Intravenous  Once 06/13/22 2255 06/14/22 0124   06/13/22 2300   ceFEPIme (MAXIPIME) 2 g in sodium chloride 0.9 % 100 mL IVPB        2 g 200 mL/hr over 30 Minutes Intravenous  Once 06/13/22 2255 06/15/22 0011        I have personally reviewed the following labs and images: CBC: Recent Labs  Lab 06/13/22 2100 06/14/22 0531 06/15/22 0337 06/16/22 0450  WBC 0.5* 0.7* 0.9* 4.9  NEUTROABS 0.2* 0.2* 0.2* 2.9  HGB 11.6* 10.0* 9.5* 9.2*  HCT 35.1* 32.9* 29.4* 28.6*  MCV 96.7 104.1* 97.7 97.9  PLT 47* 43* 35* 33*   BMP &GFR Recent Labs  Lab 06/13/22 2100 06/14/22 0748 06/15/22 0337 06/16/22 0450  NA 139 140 139 140  K 4.1 4.1 3.7 3.8  CL 107 111 110 114*  CO2 $Re'24 22 23 22  'jPJ$ GLUCOSE 100* 93 128* 106*  BUN 41* 35* 33* 24*  CREATININE 2.07* 1.67* 1.40* 1.23  CALCIUM 8.8* 8.2* 8.1* 7.9*  MG  --  1.8 1.9 1.7  PHOS  --   --  3.0  --    Estimated Creatinine Clearance: 52.8 mL/min (by C-G formula based on SCr of 1.23 mg/dL). Liver & Pancreas: Recent Labs  Lab 06/13/22 2100 06/14/22 0748 06/15/22 0337 06/16/22 0450  AST 85* 69* 70* 53*  ALT 46* 36 37 32  ALKPHOS 157* 123 129* 112  BILITOT 0.5 0.6 0.5 0.6  PROT 6.1* 4.9* 4.9* 4.5*  ALBUMIN 3.3* 2.7* 2.7* 2.4*   Recent Labs  Lab 06/13/22 2100  LIPASE 69*   Recent Labs  Lab 06/13/22 2100  AMMONIA 14   Diabetic: No results for input(s): "HGBA1C" in the last 72 hours. Recent Labs  Lab 06/13/22 1949 06/14/22 2226 06/15/22 0200  GLUCAP 106* 86 133*   Cardiac Enzymes: Recent Labs  Lab 06/14/22 0846  CKTOTAL 124   No results for input(s): "PROBNP" in the last 8760 hours. Coagulation Profile: Recent Labs  Lab 06/13/22 2200 06/14/22 0531 06/15/22 0337 06/16/22 0450  INR 3.5* 4.2* 3.7* 2.0*   Thyroid Function Tests: Recent Labs    06/15/22 0337  TSH 2.271   Lipid Profile: No results for input(s): "CHOL", "HDL", "LDLCALC", "TRIG", "CHOLHDL", "LDLDIRECT" in the last 72 hours. Anemia Panel: Recent Labs    06/15/22 0337  VITAMINB12 3,216*  FOLATE 7.1  FERRITIN  >7,500*  TIBC 162*  IRON 71  RETICCTPCT 0.6   Urine analysis:    Component Value Date/Time   COLORURINE YELLOW 06/13/2022 2300   APPEARANCEUR HAZY (A) 06/13/2022 2300   LABSPEC 1.017 06/13/2022 2300   PHURINE 5.0 06/13/2022 2300   GLUCOSEU NEGATIVE 06/13/2022 2300   GLUCOSEU NEGATIVE 08/11/2017 0843   HGBUR MODERATE (A) 06/13/2022 2300   HGBUR negative 03/20/2010 0843   BILIRUBINUR NEGATIVE 06/13/2022 2300   BILIRUBINUR n 03/22/2011 0000   KETONESUR NEGATIVE 06/13/2022 2300   PROTEINUR 100 (A) 06/13/2022 2300   UROBILINOGEN 0.2 08/11/2017 0843   NITRITE NEGATIVE 06/13/2022 2300   LEUKOCYTESUR NEGATIVE 06/13/2022 2300   Sepsis Labs: Invalid input(s): "PROCALCITONIN", "LACTICIDVEN"  Microbiology: Recent Results (from the past 240 hour(s))  SARS Coronavirus 2 by RT PCR (hospital order, performed in [facility] hospital lab) *cepheid single result test* Anterior Nasal Swab     Status: None   Collection Time: 06/13/22  8:26 PM   Specimen: Anterior Nasal Swab  Result Value Ref Range Status   SARS Coronavirus 2 by RT PCR NEGATIVE NEGATIVE Final    Comment: (NOTE) SARS-CoV-2 target nucleic acids are NOT DETECTED.  The SARS-CoV-2 RNA is generally detectable in upper and lower respiratory specimens during the acute phase of infection. The lowest concentration of SARS-CoV-2 viral copies this assay can detect is 250 copies / mL. A negative result does not preclude SARS-CoV-2 infection and should not be used as the sole basis for treatment or other patient management decisions.  A negative result may occur with improper specimen collection / handling, submission of specimen other than nasopharyngeal swab, presence of viral mutation(s) within the areas targeted by this assay, and inadequate number of viral copies (<250 copies / mL). A negative result must be combined with clinical observations, patient history, and epidemiological information.  Fact Sheet for Patients:    https://www.patel.info/  Fact  Sheet for Healthcare Providers: https://hall.com/  This test is not yet approved or  cleared by the [place] and has been authorized for detection and/or diagnosis of SARS-CoV-2 by FDA under an Emergency Use Authorization (EUA).  This EUA will remain in effect (meaning this test can be used) for the duration of the COVID-19 declaration under Section 564(b)(1) of the Act, 21 U.S.C. section 360bbb-3(b)(1), unless the authorization is terminated or revoked sooner.  Performed at [facility], [place] [address], [place], [place] [address]   Culture, blood (Routine X 2) w Reflex to ID Panel     Status: None (Preliminary result)   Collection Time: 06/14/22  8:36 AM   Specimen: BLOOD RIGHT HAND  Result Value Ref Range Status   Specimen Description   Final    BLOOD RIGHT HAND Performed at [facility] [address], [place], [place] [address]    Special Requests   Final    IN PEDIATRIC BOTTLE Blood Culture adequate volume Performed at [facility] [address] [place], [place] [address]    Culture   Final    NO GROWTH 2 DAYS Performed at [facility] [address] [place], [place] [address]    Report Status PENDING  Incomplete  Culture, blood (Routine X 2) w Reflex to ID Panel     Status: None (Preliminary result)   Collection Time: 06/14/22  8:46 AM   Specimen: BLOOD  Result Value Ref Range Status   Specimen Description   Final    BLOOD BLOOD LEFT FOREARM Performed at [facility] [address], [place], [place] [address]    Special Requests   Final    BOTTLES DRAWN AEROBIC ONLY Blood Culture results may not be optimal due to an inadequate volume of blood received in culture bottles Performed at [facility] [address] [place], [place] [address]    Culture   Final    NO GROWTH 2  DAYS Performed at [facility] [address], [place], [place] [address]    Report Status PENDING  Incomplete    Radiology Studies: No results found.    Cedar Roseman T. Stewardson  If 7PM-7AM, please contact night-coverage [email or web address] 06/16/2022, 1:13 PM

## 2022-06-16 NOTE — Progress Notes (Signed)
Progress Note  Patient Name: [name] [facility] Date of Encounter: 06/16/2022  Primary Cardiologist:   Jenkins Rouge, MD   Subjective   Events during MRI noted.  Orthostasis.  No acute pain or SOB.    Inpatient Medications    Scheduled Meds:  acyclovir  400 mg Oral Daily   feeding supplement  237 mL Oral TID BM   megestrol  400 mg Oral BID   multivitamin with minerals  1 tablet Oral Daily   pantoprazole  40 mg Oral Daily   rosuvastatin  5 mg Oral Daily   sertraline  100 mg Oral q morning   Tbo-Filgrastim  480 mcg Subcutaneous q1800   Warfarin - Pharmacist Dosing Inpatient   Does not apply q1600   Continuous Infusions:  sodium chloride 75 mL/hr at 06/16/22 0555   ceFEPime (MAXIPIME) IV Stopped (06/16/22 0245)   metronidazole Stopped (06/16/22 0217)   vancomycin Stopped (06/15/22 2249)   PRN Meds: acetaminophen **OR** [DISCONTINUED] acetaminophen, ondansetron **OR** ondansetron (ZOFRAN) IV   Vital Signs    Vitals:   06/15/22 0900 06/15/22 1421 06/15/22 2312 06/16/22 0516  BP: 103/60 (!) 97/52 (!) 99/58 103/64  Pulse: 65 64 66 64  Resp: $Remo'16 20 18 18  'iMRJK$ Temp: 97.7 F (36.5 C) (!) 97.5 F (36.4 C) 98.2 F (36.8 C) 97.9 F (36.6 C)  TempSrc: Oral Oral Oral Oral  SpO2: 97% 99% 98% 98%  Weight:      Height:        Intake/Output Summary (Last 24 hours) at 06/16/2022 0742 Last data filed at 06/16/2022 0555 Gross per 24 hour  Intake 2043.58 ml  Output 575 ml  Net 1468.58 ml   Filed Weights   06/13/22 1857 06/14/22 0400 06/15/22 0500  Weight: 77.1 kg 77.1 kg 77.6 kg    Telemetry    NSR, PVCs, no sustained arrhythmias - Personally Reviewed  ECG    NA - Personally Reviewed  Physical Exam   GEN: No acute distress.   Neck: No  JVD Cardiac: RRR, 3/6 apical systolic murmur, no diastolic murmurs, rubs, or gallops.  Respiratory: Clear  to auscultation bilaterally. GI: Soft, nontender, non-distended  MS: No  edema; No deformity. Neuro:  Nonfocal  Psych: Normal affect    Labs    Chemistry Recent Labs  Lab 06/14/22 0748 06/15/22 0337 06/16/22 0450  NA 140 139 140  K 4.1 3.7 3.8  CL 111 110 114*  CO2 $Re'22 23 22  'SJP$ GLUCOSE 93 128* 106*  BUN 35* 33* 24*  CREATININE 1.67* 1.40* 1.23  CALCIUM 8.2* 8.1* 7.9*  PROT 4.9* 4.9* 4.5*  ALBUMIN 2.7* 2.7* 2.4*  AST 69* 70* 53*  ALT 36 37 32  ALKPHOS 123 129* 112  BILITOT 0.6 0.5 0.6  GFRNONAA 42* 52* >60  ANIONGAP 7 6 4*     Hematology Recent Labs  Lab 06/14/22 0531 06/15/22 0337 06/16/22 0450  WBC 0.7* 0.9* 4.9  RBC 3.16* 3.01*  2.99* 2.92*  HGB 10.0* 9.5* 9.2*  HCT 32.9* 29.4* 28.6*  MCV 104.1* 97.7 97.9  MCH 31.6 31.6 31.5  MCHC 30.4 32.3 32.2  RDW 14.3 14.2 13.9  PLT 43* 35* 33*    Cardiac EnzymesNo results for input(s): "TROPONINI" in the last 168 hours. No results for input(s): "TROPIPOC" in the last 168 hours.   BNPNo results for input(s): "BNP", "PROBNP" in the last 168 hours.   DDimer No results for input(s): "DDIMER" in the last 168 hours.   Radiology  MR LIVER W WO CONTRAST  Result Date: 06/15/2022 CLINICAL DATA:  Liver lesion. History of chronic lymphocytic leukemia. EXAM: MRI ABDOMEN WITHOUT AND WITH CONTRAST TECHNIQUE: Multiplanar multisequence MR imaging of the abdomen was performed both before and after the administration of intravenous contrast. CONTRAST:  7.47mL GADAVIST GADOBUTROL 1 MMOL/ML IV SOLN COMPARISON:  Abdominal ultrasound 06/14/2022. Abdominopelvic CT 02/09/2022. FINDINGS: Despite efforts by the technologist and patient, moderate to severe motion artifact is present on today's exam and could not be eliminated. This reduces exam sensitivity and specificity. The motion becomes progressively worse throughout the examination, limiting the postcontrast images. Lower chest: Trace right pleural effusion. Status post aortic valve replacement. Hepatobiliary: Liver assessment is limited by the motion, especially on the postcontrast images. Numerous small T2 hyperintense  nodules are present throughout the liver with mild restricted diffusion. These are not well seen on the limited postcontrast images. No concerning focal mass lesions are identified. No lesions were apparent in the liver on prior CT. No evidence of gallstones, gallbladder wall thickening or biliary dilatation. Pancreas: Unremarkable. No pancreatic ductal dilatation or surrounding inflammatory changes. Spleen: Stable mild splenomegaly without numerous small nonspecific nodules demonstrating T2 hypointensity. Adrenals/Urinary Tract: Both adrenal glands appear normal. Stable bilateral renal cysts which do not require imaging follow-up. No evidence of enhancing renal mass or hydronephrosis. Stomach/Bowel: The stomach appears unremarkable for its degree of distension. No evidence of bowel wall thickening, distention or surrounding inflammatory change. Vascular/Lymphatic: Small residual retroperitoneal and mesenteric lymph nodes are not pathologically enlarged. Aortic atherosclerosis, better seen on CT. No evidence of aneurysm or large vessel occlusion. No significant vascular findings. Other: No evidence of abdominal wall hernia or ascites. Musculoskeletal: No acute or significant osseous findings. Susceptibility artifact in the back corresponding with small metallic foreign bodies on prior imaging. IMPRESSION: 1. Motion limited examination results in suboptimal evaluation of the liver. There are innumerable small T2 hyperintense lesions throughout the liver, but no focal lesions or abnormal enhancement identified on the postcontrast images. Findings may relate to regenerating nodules. Given the limitations of this examination, consider follow-up contrast enhanced CT. 2. Stable mild splenomegaly with scattered small nonspecific splenic nodules, likely benign. 3. No recurrent upper abdominal adenopathy. 4. No acute abdominal findings. Electronically Signed   By: Richardean Sale M.D.   On: 06/15/2022 09:23   MR BRAIN W WO  CONTRAST  Result Date: 06/14/2022 CLINICAL DATA:  Altered mental status.  Possible brain metastases. EXAM: MRI HEAD WITHOUT AND WITH CONTRAST TECHNIQUE: Multiplanar, multiecho pulse sequences of the brain and surrounding structures were obtained without and with intravenous contrast. CONTRAST:  7.40mL GADAVIST GADOBUTROL 1 MMOL/ML IV SOLN COMPARISON:  09/02/2017 FINDINGS: Brain: No acute infarct, mass effect or extra-axial collection. No acute or chronic hemorrhage. There is multifocal hyperintense T2-weighted signal within the white matter. Generalized volume loss. There are old bilateral cerebellar small vessel infarcts. There is no abnormal contrast enhancement. Vascular: Major flow voids are preserved. Skull and upper cervical spine: Normal calvarium and skull base. Visualized upper cervical spine and soft tissues are normal. Sinuses/Orbits:No paranasal sinus fluid levels or advanced mucosal thickening. No mastoid or middle ear effusion. Normal orbits. IMPRESSION: 1. No intracranial metastatic disease. 2. Chronic small vessel ischemia and volume loss. Electronically Signed   By: Ulyses Jarred M.D.   On: 06/14/2022 22:45   ECHOCARDIOGRAM COMPLETE  Result Date: 06/14/2022    ECHOCARDIOGRAM REPORT   Patient Name:   [name] Date of Exam: 06/14/2022 Medical Rec #:  [id number]  Height:       70.0 in Accession #:    [id number]     Weight:       170.0 lb Date of Birth:  [birth year]      BSA:          1.948 m Patient Age:    62 years       BP:           93/58 mmHg Patient Gender: M              HR:           72 bpm. Exam Location:  Inpatient Procedure: 2D Echo, Color Doppler and Cardiac Doppler Indications:    R06.9 DOE  History:        Patient has prior history of Echocardiogram examinations, most                 recent 07/13/2021. Arrythmias:Atrial Fibrillation; Risk                 Factors:Hypertension and Dyslipidemia. Aortic Valve Replaced in                 2011.  Sonographer:    Raquel Sarna Senior RDCS Referring  Phys: [place]  1. Left ventricular ejection fraction, by estimation, is 50%. The left ventricle has mildly decreased function. The left ventricle demonstrates global hypokinesis. There is mild left ventricular hypertrophy. Left ventricular diastolic parameters are consistent with Grade II diastolic dysfunction (pseudonormalization).  2. Right ventricular systolic function is normal. The right ventricular size is normal. There is normal pulmonary artery systolic pressure. The estimated right ventricular systolic pressure is 32.4 mmHg.  3. Left atrial size was moderately dilated.  4. Right atrial size was mildly dilated.  5. The mitral valve is normal in structure. Trivial mitral valve regurgitation. No evidence of mitral stenosis.  6. Bioprosthetic aortic valve present, the valve appears thickened/calcified. Trivial aortic insufficiency. Mean gradient elevated at 47 mmHg suggesting severe stenosis. Dimensionless index 0.25, AVA 0.87 cm^2.  7. Aortic dilatation noted. There is mild dilatation of the aortic root, measuring 39 mm.  8. The inferior vena cava is normal in size with greater than 50% respiratory variability, suggesting right atrial pressure of 3 mmHg. FINDINGS  Left Ventricle: Left ventricular ejection fraction, by estimation, is 50%. The left ventricle has mildly decreased function. The left ventricle demonstrates global hypokinesis. The left ventricular internal cavity size was normal in size. There is mild left ventricular hypertrophy. Left ventricular diastolic parameters are consistent with Grade II diastolic dysfunction (pseudonormalization). Right Ventricle: The right ventricular size is normal. No increase in right ventricular wall thickness. Right ventricular systolic function is normal. There is normal pulmonary artery systolic pressure. The tricuspid regurgitant velocity is 2.71 m/s, and  with an assumed right atrial pressure of 3 mmHg, the estimated right ventricular systolic  pressure is 40.1 mmHg. Left Atrium: Left atrial size was moderately dilated. Right Atrium: Right atrial size was mildly dilated. Pericardium: There is no evidence of pericardial effusion. Mitral Valve: The mitral valve is normal in structure. Trivial mitral valve regurgitation. No evidence of mitral valve stenosis. Tricuspid Valve: The tricuspid valve is normal in structure. Tricuspid valve regurgitation is mild. Aortic Valve: Bioprosthetic aortic valve present, the valve appears thickened/calcified. Trivial aortic insufficiency. Mean gradient elevated at 47 mmHg suggesting severe stenosis. Dimensionless index 0.25, AVA 0.87 cm^2. The aortic valve has been repaired/replaced. Aortic valve regurgitation is trivial. Aortic valve mean gradient measures 47.0 mmHg.  Aortic valve peak gradient measures 71.6 mmHg. Aortic valve area, by VTI measures 0.88 cm. Pulmonic Valve: The pulmonic valve was normal in structure. Pulmonic valve regurgitation is trivial. Aorta: Aortic dilatation noted. There is mild dilatation of the aortic root, measuring 39 mm. Venous: The inferior vena cava is normal in size with greater than 50% respiratory variability, suggesting right atrial pressure of 3 mmHg. IAS/Shunts: No atrial level shunt detected by color flow Doppler.  LEFT VENTRICLE PLAX 2D LVIDd:         5.20 cm   Diastology LVIDs:         3.80 cm   LV e' medial:    5.00 cm/s LV PW:         1.10 cm   LV E/e' medial:  13.2 LV IVS:        1.10 cm   LV e' lateral:   9.79 cm/s LVOT diam:     2.10 cm   LV E/e' lateral: 6.7 LV SV:         86 LV SV Index:   44 LVOT Area:     3.46 cm  RIGHT VENTRICLE RV S prime:     11.60 cm/s TAPSE (M-mode): 1.5 cm LEFT ATRIUM              Index        RIGHT ATRIUM           Index LA diam:        3.80 cm  1.95 cm/m   RA Area:     18.10 cm LA Vol (A2C):   101.0 ml 51.85 ml/m  RA Volume:   46.50 ml  23.87 ml/m LA Vol (A4C):   101.0 ml 51.85 ml/m LA Biplane Vol: 103.0 ml 52.88 ml/m  AORTIC VALVE AV Area  (Vmax):    0.86 cm AV Area (Vmean):   0.85 cm AV Area (VTI):     0.88 cm AV Vmax:           423.00 cm/s AV Vmean:          328.000 cm/s AV VTI:            0.978 m AV Peak Grad:      71.6 mmHg AV Mean Grad:      47.0 mmHg LVOT Vmax:         105.00 cm/s LVOT Vmean:        80.750 cm/s LVOT VTI:          0.250 m LVOT/AV VTI ratio: 0.26  AORTA Ao Root diam: 3.90 cm Ao Asc diam:  3.60 cm MITRAL VALVE               TRICUSPID VALVE MV Area (PHT): 2.87 cm    TR Peak grad:   29.4 mmHg MV Decel Time: 264 msec    TR Vmax:        271.00 cm/s MV E velocity: 66.00 cm/s MV A velocity: 41.10 cm/s  SHUNTS MV E/A ratio:  1.61        Systemic VTI:  0.25 m                            Systemic Diam: 2.10 cm Dalton McleanMD Electronically signed by Franki Monte Signature Date/Time: 06/14/2022/3:32:16 PM    Final    CT BONE MARROW BIOPSY & ASPIRATION  Result Date: 06/14/2022 INDICATION: 76 year old with history of CLL.  Leukopenia and thrombocytopenia. EXAM: CT  GUIDED BONE MARROW ASPIRATES AND BIOPSY Physician: Rachelle Hora. Lowella Dandy, MD MEDICATIONS: 1% lidocaine: Local anesthetic ANESTHESIA/SEDATION: None COMPLICATIONS: None immediate. PROCEDURE: The procedure was explained to the patient. The risks and benefits of the procedure were discussed and the patient's questions were addressed. Informed consent was obtained from the patient. The patient was placed prone on CT table. Images of the pelvis were obtained. The right side of back was prepped and draped in sterile fashion. The skin and right posterior ilium were anesthetized with 1% lidocaine. 11 gauge bone needle was directed into the right ilium with CT guidance. Two aspirates and one core biopsy were obtained. Bandage placed over the puncture site. RADIATION DOSE REDUCTION: This exam was performed according to the departmental dose-optimization program which includes automated exposure control, adjustment of the mA and/or kV according to patient size and/or use of iterative  reconstruction technique. FINDINGS: Biopsy needle was directed into the posterior right ilium. IMPRESSION: CT guided bone marrow aspiration and core biopsy. Electronically Signed   By: Richarda Overlie M.D.   On: 06/14/2022 13:35   US Abdomen Complete  Result Date: 06/14/2022 CLINICAL DATA:  History of chronic lymphocytic leukemia. EXAM: ABDOMEN ULTRASOUND COMPLETE COMPARISON:  CT scan of February 09, 2022. FINDINGS: Gallbladder: No gallstones or wall thickening visualized. No sonographic Murphy sign noted by sonographer. Common bile duct: Diameter: 4 mm which is within normal limits. Liver: 8 mm rounded hypoechoic abnormality is noted in right hepatic lobe which is not diagnostic for a cyst based on ultrasound criteria. Within normal limits in parenchymal echogenicity. Portal vein is patent on color Doppler imaging with normal direction of blood flow towards the liver. IVC: No abnormality visualized. Pancreas: Visualized portion unremarkable. Spleen: Measures 14.9 x 12.2 x 6.8 cm with calculated volume of 644 cc, consistent with mild splenomegaly. No focal abnormality is noted. Right Kidney: Length: 12.8 cm. Two cysts are noted, the largest measuring 2.9 cm. Echogenicity within normal limits. No mass or hydronephrosis visualized. Left Kidney: Length: 12.1 cm. 2.3 cm simple cyst is seen in lower pole. Echogenicity within normal limits. No mass or hydronephrosis visualized. Abdominal aorta: No aneurysm visualized. Other findings: None. IMPRESSION: 8 mm rounded hypoechoic abnormality noted in right hepatic lobe which is not diagnostic for cyst based on ultrasound criteria. Neoplasm or metastatic disease cannot be excluded. When the patient is clinically stable and able to follow directions and hold their breath (preferably as an outpatient) further evaluation with dedicated abdominal MRI should be considered. Mild splenomegaly is noted. Electronically Signed   By: Lupita Raider M.D.   On: 06/14/2022 08:43    Cardiac  Studies   ECHO  1. Left ventricular ejection fraction, by estimation, is 50%. The left  ventricle has mildly decreased function. The left ventricle demonstrates  global hypokinesis. There is mild left ventricular hypertrophy. Left  ventricular diastolic parameters are  consistent with Grade II diastolic dysfunction (pseudonormalization).   2. Right ventricular systolic function is normal. The right ventricular  size is normal. There is normal pulmonary artery systolic pressure. The  estimated right ventricular systolic pressure is 32.4 mmHg.   3. Left atrial size was moderately dilated.   4. Right atrial size was mildly dilated.   5. The mitral valve is normal in structure. Trivial mitral valve  regurgitation. No evidence of mitral stenosis.   6. Bioprosthetic aortic valve present, the valve appears  thickened/calcified. Trivial aortic insufficiency. Mean gradient elevated  at 47 mmHg suggesting severe stenosis. Dimensionless index 0.25, AVA 0.87  cm^2.   7. Aortic dilatation noted. There is mild dilatation of the aortic root,  measuring 39 mm.   8. The inferior vena cava is normal in size with greater than 50%  respiratory variability, suggesting right atrial pressure of 3 mmHg.   Patient Profile     76 y.o. male patient with bioprosthetic AVRwith HALT/HAM on chronic warfarin.  Admitted with weakness, pancytopenia and confusion.    Assessment & Plan    Aortic stenosis:  Severe AS.  No plans for further in patient evaluation.  He will be followed by Dr. [name].  Might eventually need consideration of valve in valve TAVR but not acutely and he would need to have other chronic and acute medical issues stable, improved (mental status, pancytopenia).    Orthostatic hypotension:  Compression garments.  Order  abdominal binder.  With low supine BP I will start Midodrine.  I suspect autonomic dysfunction for unclear reasons (as is often the case.) I discussed avoiding position changes and  means of recognizing and avoiding drops in BP.      History of PE:  He has been on warfarin at the direction of Dr. [name] as this was felt to be the only option given valvular microthrombosis when he was on Xarelto.  Continue current therapy.     CKD IIIA:  Creat is back to baseline.    Pancytopenia:  He is being followed actively by [name] this admission .    PAF: He is maintaining sinus rhythm.    For questions or updates, please contact [place] Please consult [email or web address] for contact info under Cardiology/STEMI.   Signed, Minus Breeding, MD  06/16/2022, 7:42 AM

## 2022-06-16 NOTE — Evaluation (Signed)
Occupational Therapy Evaluation Patient Details Name: [name] MRN: [id number] DOB: [birth year] Today's Date: 06/16/2022   History of Present Illness 76 yo male presents to ED with AMS, weakness x3 days. +  leukopenia and thrombocytopenia. Pt receives chemotherapy for CLL, last chemo >2 months ago. s/p CT guided bone marrow biopsy on 8/4. seizure like activity 8/4. PMH includes AVR, CKDIII, PE, afib, HLD, HTN.   Clinical Impression   Patient is a 76 year old male who was admitted for above. Patient was living at home alone prior level with dog. Currenty, patients BP is dropping impacting standing tolerance with patient requesting to sit back down just as BP was getting first standing reading with one error attempt at first.patient is asymptomatic for BP levels and was confused as to why activites were being limited but was not noted to fully understand education. Patient reported he could stand for 5 mins and when educated he had not today patient reported he could. Patient remains unable to stand past 1.5 mins on this date.  Patient was noted to have decreased functional activity tolerance, decreased endurance, decreased standing balance, decreased safety awareness, and decreased knowledge of AD/AE impacting participation in ADLs. Patient would continue to benefit from skilled OT services at this time while admitted and after d/c to address noted deficits in order to improve overall safety and independence in ADLs.     Blood pressure: Supine: 108/49 mmhg HR 72 bpm with abdominal binder and compression stockings in place.  Sitting EOB: 93/52 mmhg HR 74 bpm Standing: 60/50 mmhg after error reading. HR 127 bpm. Patient returned to bed at this time.    Recommendations for follow up therapy are one component of a multi-disciplinary discharge planning process, led by the attending physician.  Recommendations may be updated based on patient status, additional functional criteria and insurance  authorization.   Follow Up Recommendations  Skilled nursing-short term rehab (<3 hours/day)    Assistance Recommended at Discharge Frequent or constant Supervision/Assistance  Patient can return home with the following A lot of help with walking and/or transfers;A lot of help with bathing/dressing/bathroom;Assistance with cooking/housework;Direct supervision/assist for financial management;Assist for transportation;Help with stairs or ramp for entrance;Direct supervision/assist for medications management    Functional Status Assessment  Patient has had a recent decline in their functional status and demonstrates the ability to make significant improvements in function in a reasonable and predictable amount of time.  Equipment Recommendations  Other (comment) (defer to next venue)    Recommendations for Other Services       Precautions / Restrictions Precautions Precautions: Fall Precaution Comments: orthostatic Restrictions Weight Bearing Restrictions: No      Mobility Bed Mobility Overal bed mobility: Needs Assistance Bed Mobility: Supine to Sit     Supine to sit: Min guard, HOB elevated     General bed mobility comments: for safety, use of bedrails and HOB elevation    Transfers                          Balance Overall balance assessment: Needs assistance Sitting-balance support: No upper extremity supported, Feet supported Sitting balance-Leahy Scale: Fair     Standing balance support: Reliant on assistive device for balance, Bilateral upper extremity supported                               ADL either performed or assessed with clinical judgement  ADL Overall ADL's : Needs assistance/impaired Eating/Feeding: Set up;Sitting Eating/Feeding Details (indicate cue type and reason): discussed importance of having food intake to maintain the ability to participate in tasks. noted to have stomach noises during session similar to hunger patient  denied feeling hungry at this time Grooming: Minimal assistance;Sitting Grooming Details (indicate cue type and reason): patient declined to participate in bathing tasks at this time reporting fatigue. nursing aware. low BP impacting participation in session as well. Upper Body Bathing: Minimal assistance;Sitting   Lower Body Bathing: Maximal assistance;Sit to/from stand;Sitting/lateral leans   Upper Body Dressing : Sitting;Minimal assistance   Lower Body Dressing: Sitting/lateral leans;Moderate assistance Lower Body Dressing Details (indicate cue type and reason): was able to simulate brining ankles to lap for LB dressing tasks. max A to don compression stockings. Toilet Transfer: Moderate assistance Toilet Transfer Details (indicate cue type and reason): patient was mod A to take few side steps up [facility] with patient declining to use RW and attempting to hold therapist hand and in the other the cart for BP machiene. patient was educated on using hand on stable item but completed steps and transitioned back to sitting at this time. Toileting- Clothing Manipulation and Hygiene: Maximal assistance;Sit to/from stand               Vision Patient Visual Report: No change from baseline       Perception     Praxis      Pertinent Vitals/Pain Pain Assessment Pain Assessment: No/denies pain     Hand Dominance Right   Extremity/Trunk Assessment Upper Extremity Assessment Upper Extremity Assessment: Generalized weakness   Lower Extremity Assessment Lower Extremity Assessment: Defer to PT evaluation   Cervical / Trunk Assessment Cervical / Trunk Assessment: Normal   Communication Communication Communication: No difficulties   Cognition Arousal/Alertness: Awake/alert Behavior During Therapy: Flat affect Overall Cognitive Status: Impaired/Different from baseline                                 General Comments: patient was noted to have extended pauses during  session with some difficulty word finding. patient was able to express that we were not on the same page but had increased difficulty getting point across during session. increased assist was noted to be irritating at times to patient.     General Comments       Exercises     Shoulder Instructions      Home Living Family/patient expects to be discharged to:: Private residence Living Arrangements: Alone Available Help at Discharge: Family Type of Home: House       Home Layout: One level               Home Equipment: None          Prior Functioning/Environment Prior Level of Function : Independent/Modified Independent             Mobility Comments: pt reports being independent and active PTA, has a dog named Dylan          OT Problem List: Decreased activity tolerance;Impaired balance (sitting and/or standing);Decreased safety awareness;Cardiopulmonary status limiting activity;Decreased knowledge of precautions;Decreased knowledge of use of DME or AE      OT Treatment/Interventions: Self-care/ADL training;Therapeutic exercise;Neuromuscular education;Energy conservation;DME and/or AE instruction;Therapeutic activities;Balance training;Patient/family education    OT Goals(Current goals can be found in the care plan section) Acute Rehab OT Goals Patient Stated Goal: to get stronger OT Goal  Formulation: With patient/family Time For Goal Achievement: 06/30/22 Potential to Achieve Goals: Fair  OT Frequency: Min 2X/week    Co-evaluation              AM-PAC OT "6 Clicks" Daily Activity     Outcome Measure Help from another person eating meals?: A Little Help from another person taking care of personal grooming?: A Little Help from another person toileting, which includes using toliet, bedpan, or urinal?: A Lot Help from another person bathing (including washing, rinsing, drying)?: A Lot Help from another person to put on and taking off regular upper body  clothing?: A Lot Help from another person to put on and taking off regular lower body clothing?: A Lot 6 Click Score: 14   End of Session Nurse Communication: Other (comment) (BP during session)  Activity Tolerance: Treatment limited secondary to medical complications (Comment) Patient left: in bed;with call bell/phone within reach;with family/visitor present  OT Visit Diagnosis: Unsteadiness on feet (R26.81);Other abnormalities of gait and mobility (R26.89);Muscle weakness (generalized) (M62.81)                Time: 1027-2536 OT Time Calculation (min): 32 min Charges:  OT General Charges $OT Visit: 1 Visit OT Evaluation $OT Eval Moderate Complexity: 1 Mod OT Treatments $Therapeutic Activity: 8-22 mins  Jackelyn Poling OTR/L, MS Acute Rehabilitation Department Office# [phone] Pager# [phone]   Marcellina Millin 06/16/2022, 1:19 PM

## 2022-06-16 NOTE — Progress Notes (Signed)
OT Cancellation Note  Patient Details Name: [name] MRN: [id number] DOB: [birth year]   Cancelled Treatment:    Reason Eval/Treat Not Completed: Other (comment) MD in room with patient. OT to continue to follow and check back as schedule will allow. Jackelyn Poling OTR/L, [place] Acute Rehabilitation Department Office# [phone] Pager# [phone]  06/16/2022, 8:22 AM

## 2022-06-16 NOTE — Progress Notes (Signed)
[facility] for Warfarin Indication: h/o recurrent PE, bioprosthetic AVR, PAFib  Allergies  Allergen Reactions   Hydrocodone-Acetaminophen Shortness Of Breath   Oxycodone Hcl Shortness Of Breath   Telmisartan-Hctz Shortness Of Breath, Palpitations and Other (See Comments)    Dizziness also   Aspirin Rash and Other (See Comments)    Confusion, also- Cannot take high doses   Atorvastatin Other (See Comments)    Myalgia    Buspirone Hcl Other (See Comments)    Headaches    Codeine Hives, Nausea And Vomiting and Other (See Comments)    Confusion also   Morphine Sulfate Itching   Paroxetine Other (See Comments)    Paresthesias: R arm, R leg, r side of face   Rabeprazole Other (See Comments)    headache   Ramipril Other (See Comments)    Unknown reaction    Patient Measurements: Height: '5\' 10"'$  (177.8 cm) Weight: 77.6 kg (171 lb) IBW/kg (Calculated) : 73  Vital Signs: Temp: 97.9 F (36.6 C) (08/06 0516) Temp Source: Oral (08/06 0516) BP: 103/64 (08/06 0516) Pulse Rate: 64 (08/06 0516)  Labs: Recent Labs    06/13/22 2100 06/13/22 2200 06/14/22 0531 06/14/22 0748 06/14/22 0846 06/15/22 0337 06/16/22 0450  HGB 11.6*  --  10.0*  --   --  9.5* 9.2*  HCT 35.1*  --  32.9*  --   --  29.4* 28.6*  PLT 47*  --  43*  --   --  35* 33*  LABPROT  --    < > 39.9*  --   --  36.2* 22.7*  INR  --    < > 4.2*  --   --  3.7* 2.0*  CREATININE 2.07*  --   --  1.67*  --  1.40* 1.23  CKTOTAL  --   --   --   --  124  --   --   TROPONINIHS 63*  --   --   --   --   --   --    < > = values in this interval not displayed.     Estimated Creatinine Clearance: 52.8 mL/min (by C-G formula based on SCr of 1.23 mg/dL).  Medications:  PTA Warfarin  Assessment: 76 yr male with CLL undergoing chemotherapy.  PMH significant for bioprosthetic AVR, CKD, recurent PE, PAF Med history documented patient states takes warfarin occasionally and is unsure of how was  supposed to be taking this medication. Stated he took Warfarin '15mg'$  on Fri and Sat, then '10mg'$  on Mon, Wed and Thur Per Onc note 7/7, dose listed '15mg'$  Sun/Thu, '10mg'$  all other days  Today, 06/16/2022 INR 2.0, now decreased in to subtherapeutic range - no warfarin given this admission due to supratherapeutic values CBC: Hgb trending down No bleeding reported Possible drug interaction with metronidazole - can increase warfarin sensitivity  Goal of Therapy:  INR goal 2.5-3.0 per Onc note 7/7   Plan:  Warfarin '10mg'$  PO x 1 today Daily PT/INR  Peggyann Juba, PharmD, BCPS Pharmacy: [phone] 06/16/2022,9:46 AM

## 2022-06-17 ENCOUNTER — Inpatient Hospital Stay (HOSPITAL_COMMUNITY)
Admit: 2022-06-17 | Discharge: 2022-06-17 | Disposition: A | Discharge status: Home / Self Care | Payer: No Typology Code available for payment source | Attending: Student | Admitting: Student

## 2022-06-17 DIAGNOSIS — N179 Acute kidney failure, unspecified: Secondary | ICD-10-CM | POA: Diagnosis not present

## 2022-06-17 DIAGNOSIS — C9110 Chronic lymphocytic leukemia of B-cell type not having achieved remission: Secondary | ICD-10-CM | POA: Diagnosis not present

## 2022-06-17 DIAGNOSIS — D696 Thrombocytopenia, unspecified: Secondary | ICD-10-CM

## 2022-06-17 DIAGNOSIS — D649 Anemia, unspecified: Secondary | ICD-10-CM

## 2022-06-17 DIAGNOSIS — I951 Orthostatic hypotension: Secondary | ICD-10-CM | POA: Diagnosis not present

## 2022-06-17 DIAGNOSIS — D709 Neutropenia, unspecified: Secondary | ICD-10-CM | POA: Diagnosis not present

## 2022-06-17 DIAGNOSIS — K769 Liver disease, unspecified: Secondary | ICD-10-CM | POA: Diagnosis not present

## 2022-06-17 DIAGNOSIS — R5383 Other fatigue: Secondary | ICD-10-CM | POA: Diagnosis not present

## 2022-06-17 DIAGNOSIS — R531 Weakness: Secondary | ICD-10-CM | POA: Diagnosis not present

## 2022-06-17 DIAGNOSIS — Z952 Presence of prosthetic heart valve: Secondary | ICD-10-CM | POA: Diagnosis not present

## 2022-06-17 DIAGNOSIS — I350 Nonrheumatic aortic (valve) stenosis: Secondary | ICD-10-CM | POA: Diagnosis not present

## 2022-06-17 DIAGNOSIS — Z86711 Personal history of pulmonary embolism: Secondary | ICD-10-CM | POA: Diagnosis not present

## 2022-06-17 LAB — TRIGLYCERIDES: Triglycerides: 195 mg/dL — ABNORMAL HIGH (ref <150)

## 2022-06-17 LAB — COMPREHENSIVE METABOLIC PANEL
ALT: 39 U/L (ref 0–44)
AST: 62 U/L — ABNORMAL HIGH (ref 15–41)
Albumin: 2.5 g/dL — ABNORMAL LOW (ref 3.5–5.0)
Alkaline Phosphatase: 115 U/L (ref 38–126)
Anion gap: 3 — ABNORMAL LOW (ref 5–15)
BUN: 18 mg/dL (ref 8–23)
CO2: 24 mmol/L (ref 22–32)
Calcium: 8.1 mg/dL — ABNORMAL LOW (ref 8.9–10.3)
Chloride: 114 mmol/L — ABNORMAL HIGH (ref 98–111)
Creatinine, Ser: 1.09 mg/dL (ref 0.61–1.24)
GFR, Estimated: >60 mL/min (ref >60)
Glucose, Bld: 93 mg/dL (ref 70–99)
Potassium: 3.6 mmol/L (ref 3.5–5.1)
Sodium: 141 mmol/L (ref 135–145)
Total Bilirubin: 0.5 mg/dL (ref 0.3–1.2)
Total Protein: 4.6 g/dL — ABNORMAL LOW (ref 6.5–8.1)

## 2022-06-17 LAB — CORTISOL: Cortisol, Plasma: 2.4 ug/dL

## 2022-06-17 LAB — PHOSPHORUS: Phosphorus: 2.1 mg/dL — ABNORMAL LOW (ref 2.5–4.6)

## 2022-06-17 LAB — CBC WITH DIFFERENTIAL/PLATELET
Abs Immature Granulocytes: 0 10*3/uL (ref 0.00–0.07)
Band Neutrophils: 2 %
Basophils Absolute: 0 10*3/uL (ref 0.0–0.1)
Basophils Relative: 0 %
Eosinophils Absolute: 0 10*3/uL (ref 0.0–0.5)
Eosinophils Relative: 0 %
HCT: 27.2 % — ABNORMAL LOW (ref 39.0–52.0)
Hemoglobin: 8.9 g/dL — ABNORMAL LOW (ref 13.0–17.0)
Lymphocytes Relative: 6 %
Lymphs Abs: 0.4 10*3/uL — ABNORMAL LOW (ref 0.7–4.0)
MCH: 31.6 pg (ref 26.0–34.0)
MCHC: 32.7 g/dL (ref 30.0–36.0)
MCV: 96.5 fL (ref 80.0–100.0)
Monocytes Absolute: 0.2 10*3/uL (ref 0.1–1.0)
Monocytes Relative: 3 %
Neutro Abs: 6.4 10*3/uL (ref 1.7–7.7)
Neutrophils Relative %: 89 %
Platelets: 33 10*3/uL — ABNORMAL LOW (ref 150–400)
RBC: 2.82 MIL/uL — ABNORMAL LOW (ref 4.22–5.81)
RDW: 13.9 % (ref 11.5–15.5)
WBC: 7 10*3/uL (ref 4.0–10.5)
nRBC: 0 % (ref 0.0–0.2)

## 2022-06-17 LAB — MAGNESIUM: Magnesium: 1.8 mg/dL (ref 1.7–2.4)

## 2022-06-17 LAB — LYME DISEASE DNA BY PCR(BORRELIA BURG)

## 2022-06-17 LAB — PROTIME-INR
INR: 1.8 — ABNORMAL HIGH (ref 0.8–1.2)
Prothrombin Time: 21 seconds — ABNORMAL HIGH (ref 11.4–15.2)

## 2022-06-17 MED ORDER — IMMUNE GLOBULIN (HUMAN) 20 GM/200ML IV SOLN
40.0000 g | INTRAVENOUS | Status: AC
  Administered 2022-06-17: 40 g via INTRAVENOUS
  Filled 2022-06-17: qty 400

## 2022-06-17 MED ORDER — SODIUM CHLORIDE 0.9 % IV SOLN
40.0000 mg | Freq: Once | INTRAVENOUS | Status: AC
  Administered 2022-06-17: 40 mg via INTRAVENOUS
  Filled 2022-06-17: qty 4

## 2022-06-17 MED ORDER — ACETAMINOPHEN 500 MG PO TABS
500.0000 mg | ORAL_TABLET | Freq: Once | ORAL | Status: AC
  Administered 2022-06-17: 500 mg via ORAL
  Filled 2022-06-17: qty 1

## 2022-06-17 MED ORDER — DIPHENHYDRAMINE HCL 25 MG PO CAPS
25.0000 mg | ORAL_CAPSULE | Freq: Once | ORAL | Status: AC
  Administered 2022-06-17: 25 mg via ORAL
  Filled 2022-06-17: qty 1

## 2022-06-17 MED ORDER — WARFARIN SODIUM 5 MG PO TABS
10.0000 mg | ORAL_TABLET | Freq: Once | ORAL | Status: AC
  Administered 2022-06-17: 10 mg via ORAL
  Filled 2022-06-17: qty 2

## 2022-06-17 MED ORDER — POTASSIUM CHLORIDE CRYS ER 20 MEQ PO TBCR
20.0000 meq | EXTENDED_RELEASE_TABLET | Freq: Once | ORAL | Status: AC
  Administered 2022-06-17: 20 meq via ORAL
  Filled 2022-06-17: qty 1

## 2022-06-17 MED ORDER — COSYNTROPIN 0.25 MG IJ SOLR
0.2500 mg | Freq: Once | INTRAMUSCULAR | Status: DC
  Filled 2022-06-17: qty 0.25

## 2022-06-17 NOTE — Progress Notes (Signed)
[name] for Warfarin Indication: h/o recurrent PE, bioprosthetic AVR, PAFib  Allergies  Allergen Reactions   Hydrocodone-Acetaminophen Shortness Of Breath   Oxycodone Hcl Shortness Of Breath   Telmisartan-Hctz Shortness Of Breath, Palpitations and Other (See Comments)    Dizziness also   Aspirin Rash and Other (See Comments)    Confusion, also- Cannot take high doses   Atorvastatin Other (See Comments)    Myalgia    Buspirone Hcl Other (See Comments)    Headaches    Codeine Hives, Nausea And Vomiting and Other (See Comments)    Confusion also   Morphine Sulfate Itching   Paroxetine Other (See Comments)    Paresthesias: R arm, R leg, r side of face   Rabeprazole Other (See Comments)    headache   Ramipril Other (See Comments)    Unknown reaction    Patient Measurements: Height: '5\' 10"'$  (177.8 cm) Weight: 81.7 kg (180 lb 1.9 oz) IBW/kg (Calculated) : 73  Vital Signs: Temp: 98.1 F (36.7 C) (08/07 1321) Temp Source: Oral (08/07 1321) BP: 113/70 (08/07 1321) Pulse Rate: 67 (08/07 1321)  Labs: Recent Labs    06/15/22 0337 06/16/22 0450 06/17/22 0302  HGB 9.5* 9.2* 8.9*  HCT 29.4* 28.6* 27.2*  PLT 35* 33* 33*  LABPROT 36.2* 22.7* 21.0*  INR 3.7* 2.0* 1.8*  CREATININE 1.40* 1.23 1.09     Estimated Creatinine Clearance: 59.5 mL/min (by C-G formula based on SCr of 1.09 mg/dL).  Medications:  PTA Warfarin  Assessment: 76 yr male with CLL undergoing chemotherapy.  PMH significant for bioprosthetic AVR, CKD, recurent PE, PAF Med history documented patient states takes warfarin occasionally and is unsure of how was supposed to be taking this medication. Stated he took Warfarin '15mg'$  on Fri and Sat, then '10mg'$  on Mon, Wed and Thur Per Onc note 7/7, dose listed '15mg'$  Sun/Thu, '10mg'$  all other days  Today, 06/17/2022 INR now subtherapeutic after several day- no warfarin given this admission due to supratherapeutic values CBC: hgb  trending slowly down; Plt low in 30s but stable; TRH/Onc aware, continuing anticoag No bleeding reported per RN Possible drug interaction with metronidazole (now stopped; got 4 days worth - can increase warfarin sensitivity)  Goal of Therapy:  INR goal 2.5-3.0 per Onc note 7/7   Plan:  Warfarin '10mg'$  PO x 1 today Daily PT/INR  Reuel Boom, PharmD, BCPS [phone] 06/17/2022, 1:34 PM

## 2022-06-17 NOTE — Progress Notes (Addendum)
Progress Note  Patient Name: [name] Date of Encounter: 06/17/2022  [facility] Cardiologist: Jenkins Rouge, MD   Subjective   Awake and alert this AM, no chest pain or SOB   Inpatient Medications    Scheduled Meds:  acetaminophen  500 mg Oral Once   acyclovir  400 mg Oral Daily   diphenhydrAMINE  25 mg Oral Once   feeding supplement  237 mL Oral TID BM   megestrol  400 mg Oral BID   midodrine  2.5 mg Oral TID WC   multivitamin with minerals  1 tablet Oral Daily   pantoprazole  40 mg Oral Daily   rosuvastatin  5 mg Oral Daily   sertraline  100 mg Oral q morning   Warfarin - Pharmacist Dosing Inpatient   Does not apply q1600   Continuous Infusions:  famotidine (PEPCID) IV     IMMUNE GLOBULIN 10% (HUMAN) IV - For Fluid Restriction Only     PRN Meds: acetaminophen **OR** [DISCONTINUED] acetaminophen, ondansetron **OR** ondansetron (ZOFRAN) IV   Vital Signs    Vitals:   06/16/22 1512 06/16/22 2130 06/17/22 0500 06/17/22 0605  BP: (!) 94/59 (!) 102/46  (!) 104/57  Pulse: 69 73  67  Resp: '20 18  18  '$ Temp: 98.3 F (36.8 C) 98.2 F (36.8 C)  98.4 F (36.9 C)  TempSrc: Oral Oral  Oral  SpO2: 99% 100%  99%  Weight:   81.7 kg   Height:        Intake/Output Summary (Last 24 hours) at 06/17/2022 0821 Last data filed at 06/17/2022 0420 Gross per 24 hour  Intake 1127.82 ml  Output 750 ml  Net 377.82 ml      06/17/2022    5:00 AM 06/15/2022    5:00 AM 06/14/2022    4:00 AM  Last 3 Weights  Weight (lbs) 180 lb 1.9 oz 171 lb 169 lb 15.6 oz  Weight (kg) 81.7 kg 77.565 kg 77.1 kg      Telemetry    SR with freq PVCs  - Personally Reviewed  ECG    No new - Personally Reviewed  Physical Exam   GEN: No acute distress.   Neck: No JVD Cardiac: RRR, 9-5/6 systolic murmurs, no rubs, or gallops.  Respiratory: Clear to auscultation bilaterally. GI: Soft, nontender, non-distended  MS: No edema; No deformity. Neuro:  Nonfocal  Psych: Normal affect   Labs     High Sensitivity Troponin:   Recent Labs  Lab 06/13/22 2100  TROPONINIHS 63*     Chemistry Recent Labs  Lab 06/15/22 0337 06/16/22 0450 06/17/22 0302  NA 139 140 141  K 3.7 3.8 3.6  CL 110 114* 114*  CO2 '23 22 24  '$ GLUCOSE 128* 106* 93  BUN 33* 24* 18  CREATININE 1.40* 1.23 1.09  CALCIUM 8.1* 7.9* 8.1*  MG 1.9 1.7 1.8  PROT 4.9* 4.5* 4.6*  ALBUMIN 2.7* 2.4* 2.5*  AST 70* 53* 62*  ALT 37 32 39  ALKPHOS 129* 112 115  BILITOT 0.5 0.6 0.5  GFRNONAA 52* >60 >60  ANIONGAP 6 4* 3*    Lipids  Recent Labs  Lab 06/17/22 0302  TRIG 195*    Hematology Recent Labs  Lab 06/15/22 0337 06/16/22 0450 06/17/22 0302  WBC 0.9* 4.9 7.0  RBC 3.01*  2.99* 2.92* 2.82*  HGB 9.5* 9.2* 8.9*  HCT 29.4* 28.6* 27.2*  MCV 97.7 97.9 96.5  MCH 31.6 31.5 31.6  MCHC 32.3 32.2 32.7  RDW 14.2 13.9 13.9  PLT 35* 33* 33*   Thyroid  Recent Labs  Lab 06/15/22 0337  TSH 2.271    BNPNo results for input(s): "BNP", "PROBNP" in the last 168 hours.  DDimer No results for input(s): "DDIMER" in the last 168 hours.   Radiology    No results found.  Cardiac Studies   ECHO 06/14/22   1. Left ventricular ejection fraction, by estimation, is 50%. The left  ventricle has mildly decreased function. The left ventricle demonstrates  global hypokinesis. There is mild left ventricular hypertrophy. Left  ventricular diastolic parameters are  consistent with Grade II diastolic dysfunction (pseudonormalization).   2. Right ventricular systolic function is normal. The right ventricular  size is normal. There is normal pulmonary artery systolic pressure. The  estimated right ventricular systolic pressure is 26.9 mmHg.   3. Left atrial size was moderately dilated.   4. Right atrial size was mildly dilated.   5. The mitral valve is normal in structure. Trivial mitral valve  regurgitation. No evidence of mitral stenosis.   6. Bioprosthetic aortic valve present, the valve appears  thickened/calcified.  Trivial aortic insufficiency. Mean gradient elevated  at 47 mmHg suggesting severe stenosis. Dimensionless index 0.25, AVA 0.87  cm^2.   7. Aortic dilatation noted. There is mild dilatation of the aortic root,  measuring 39 mm.   8. The inferior vena cava is normal in size with greater than 50%  respiratory variability, suggesting right atrial pressure of 3 mmHg.   Patient Profile     76 y.o. male with bioprosthetic AVRwith HALT/HAM on chronic warfarin.  Admitted with weakness, pancytopenia and confusion.   Assessment & Plan    Severe AS, no plans for further inpatient evaluation  Orthostatic hypotension:  reviewed compression, midodrine, slow position changes.   History of PE:  warfarin per pharmacy   CKD IIIA:  Cr 1.09 today   Pancytopenia:  He is being followed actively by [name] this admission . Pts 33k, hgb 8.9 WBC up to 7   PAF: He is maintaining sinus rhythm.  freq PVCs today K+ 3.6 Mg+ 1.8   will give 20 meq K+ today         For questions or updates, please contact [place] Please consult [email or web address] for contact info under        Signed, Cecilie Kicks, NP  06/17/2022, 8:21 AM

## 2022-06-17 NOTE — Progress Notes (Signed)
PROGRESS NOTE  [name] SIF:[id number] DOB: 03/25/1946   PCP: Shelva Majestic, MD  Patient is from: Home.  Lives alone.  Independently ambulates at baseline.  DOA: 06/13/2022 LOS: 3  Chief complaints Chief Complaint  Patient presents with   Weakness   chemo patient     Brief Narrative / Interim history: 76 year old M with PMH of prostate cancer s/p prostatectomy, CLL, bioprosthetic AVR, recurrent PE on Coumadin, PAF and CKD-3B brought to ED after found down on his knees and hands by his friend.  Patient denied fall but fatigue.  No signs of serious injury.  In ED, mild temp to 99.6.  Otherwise vital stable.  WBC 0.5 with ANC of 200.  Hgb 11.6.  Platelet 47 (baseline in the 90s).  Cr 2.0 (from 1.5 about a months ago).  AST 85.  ALT 46.  Total bili 0.5.  ALP 157.  Lactic acid normal.  Troponin 63.  INR 3.5.  UA with moderate Hgb.  CT head and CXR without acute finding.  EKG sinus rhythm with PACs.  Oncology consulted.  Patient was started on broad-spectrum antibiotics and admitted for altered mental status, AKI and neutropenia.  TTE ordered.  In regards to pancytopenia/neutropenia.  Underwent bone marrow biopsy on 8/5.  Cultures negative.  He was given Granix, and neutropenia/leukopenia resolved.  Thrombocytopenia worse.  Antibiotics discontinued on 8/7.  Oncology following.  Patient had RUQ [name] as part of evaluation for elevated LFT that showed 8 mm right hepatic lobe lesion.  AFP and CEA negative. MRI liver was motion limited and showed innumerable small T2 hyperintense lesions throughout the liver suggesting regenerating nodules but no focal lesion or abnormal enhancement.  Follow-up contrast-enhanced CT was recommended.    Workup including MRI brain unrevealing.  It looks like he has cognitive decline.  He had seizure-like activity while getting an MRI.  EEG ordered.  Neurology consulted but symptoms felt to be from orthostatic hypotension versus seizure.  Orthostatic vitals were  positive.   TTE showed LVEF of 50%, GH, G2-DD, RVSP of 32.4, moderate LAE, severe bioprosthetic aortic valve stenosis.  Cardiology consulted and recommended follow-up with his cardiologist.   Therapy recommended SNF.  Subjective: Seen and examined earlier this morning.  No major events overnight of this morning.  Has no complaints other than the urge to urinate.  He has urinal in his hand.  He is oriented x 4 but slow to respond.  Mini-Mental exam 2/3 for instant and delayed recall.  Objective: Vitals:   06/16/22 2130 06/17/22 0500 06/17/22 0605 06/17/22 1321  BP: (!) 102/46  (!) 104/57 113/70  Pulse: 73  67 67  Resp: 18  18 18   Temp: 98.2 F (36.8 C)  98.4 F (36.9 C) 98.1 F (36.7 C)  TempSrc: Oral  Oral Oral  SpO2: 100%  99% 99%  Weight:  81.7 kg    Height:        Examination:  GENERAL: No apparent distress.  Nontoxic. HEENT: MMM.  Vision and hearing grossly intact.  NECK: Supple.  No apparent JVD.  RESP:  No IWOB.  Fair aeration bilaterally. CVS:  RRR.  2/6 SEM over RUSB.  Mechanical heart sounds. ABD/GI/GU: BS+. Abd soft, NTND.  MSK/EXT:  Moves extremities. No apparent deformity. No edema.  SKIN: no apparent skin lesion or wound NEURO: Awake and alert. Oriented appropriately.  Limited insight and recall.  No apparent focal neuro deficit. PSYCH: Calm. Normal affect.   Procedures:  8/4-bone marrow biopsy  Microbiology  summarized: COVID-19 PCR negative. Blood culture NGTD.  Assessment and plan: Principal Problem:   Altered mental status Active Problems:   Neutropenia ([place])   Acute renal failure superimposed on stage 3b chronic kidney disease (HCC)   Chronic lymphocytic leukemia (HCC)   S/P Bioprosthetic AVR (aortic valve replacement) in 2011   History of pulmonary embolism   Pancytopenia (HCC)   Transaminitis   Liver lesion, right lobe   AMS (altered mental status)   Severe aortic stenosis   Chronic diastolic CHF (congestive heart failure) (HCC)    Seizure-like activity (HCC)   Orthostatic hypotension  Altered mental status: Unclear how long this has been going on.  Looks like cognitive decline.  He has orthostatic hypotension as well.  Per patient's aunt, his cognition seems to be the same as when she saw him 5 months ago.  Has no focal neurodeficit.  He is oriented x4 but limited insight and recalls.  CT head and MRI without acute finding.  He has no focal neuro symptoms.  No signs of meningism.  CT head, MRI brain, ammonia, B12 and TSH unrevealing.  He has history of CLL.  Also had seizure-like activity while getting MRI -Follow EEG but low suspicion for seizure. -Follow Lyme/tickborne disease serologies. -Reorientation and delirium precautions -Discontinue broad-spectrum antibiotics  Seizure-like activity-Reportedly had shaking and urinary incontinence while getting MRI. felt to be from orthostatic hypotension versus seizure.   -Follow EEG but low suspicion for seizure. -Neurology signed off.  Severe aortic valve stenosis inpatient with Bioprosthetic AVR-noted on TTE.  Per aunt, DOE and "tremors" with exertion.  He might be having DOE and presyncope.  However, cardiology felt his TTE finding are stable compared to his TTE in August 2022.  Patient is followed by Dr. [name] outpatient. -Appreciate input by cardiology -No further inpatient evaluation but outpatient follow-up for valve in valve TAVR consideration  Orthostatic hypotension: Probably contributing to his tremors with activity and exertion and DOE.  Orthostatic vitals positive.  TTE as above.  TSH within normal.  This could be due to aortic stenosis and a/or autonomic dysfunction.  His a.m. cortisol is low. -Appreciate help by cardiology-compression garments, abdominal binder and midodrine -Elevate head of bed to at least 30 degree -Check ACTH and ACTH stimulation test -Fall precaution -Therapy recommended SNF.  AKI: Likely prerenal.  Initially thought to have CKD.  CKD  ruled out. Recent Labs    02/25/22 1420 03/28/22 0855 04/10/22 1049 05/08/22 1007 05/22/22 0901 06/13/22 2100 06/14/22 0748 06/15/22 0337 06/16/22 0450 06/17/22 0302  BUN 28* 27* 24* 27* 32* 41* 35* 33* 24* 18  CREATININE 1.79* 1.29* 1.27* 1.28* 1.53* 2.07* 1.67* 1.40* 1.23 1.09  -Monitor off IV fluid -Monitor  Neutropenia/pancytopenia/CLL: Reportedly has not had chemotherapy in 2 months.  Started on Granix on 8/5.  Leukopenia/neutropenia resolved.  Anemia stable.  Thrombocytopenia low but stable.  Blood cultures negative. -BSA discontinued. -Continue home acyclovir -Follow bone marrow biopsy result -Oncology following.  Liver lesion/mild transaminitis: LFT improved.  Colonoscopy in 2014 with mild diverticulosis, and no mentions of polyps. MRI liver was motion limited and showed innumerable small T2 hyperintense lesions throughout the liver suggesting regenerating nodules but no focal lesion or abnormal enhancement.  AFP and CEA negative. -Follow-up contrast-enhanced CT was recommended.   -Continue monitoring  History of pulmonary embolism/supratherapeutic INR: INR 2.0. -Warfarin per pharmacy  Paroxysmal A-fib: Currently in sinus rhythm.  Not on rate or rhythm control -Warfarin per pharmacy  Chronic diastolic CHF: TTE with LVEF of  50%, GH, G2-DD and severe aortic stenosis.  Appears euvolemic on exam.  Not on diuretics at home.  Denies dyspnea or chest pain. -Monitor fluid status   Generalized weakness/fatigue: Independently ambulates at baseline. -PT/OT-recommended SNF.   Decreased oral intake Body mass index is 25.84 kg/m. Nutrition Problem: Inadequate oral intake Etiology: decreased appetite Signs/Symptoms: per patient/family report Interventions: Ensure Enlive (each supplement provides 350kcal and 20 grams of protein), MVI   DVT prophylaxis:  Place TED hose Start: 06/15/22 1256 SCDs Start: 06/14/22 0347  warfarin (COUMADIN) tablet 10 mg  Code Status: Full  code Family Communication: None at the bedside today Level of care: Telemetry Status is: Inpatient The patient will remain inpatient because: Orthostatic hypotension   Final disposition: SNF Consultants:  Oncology Neurology-signed off Cardiology  Sch Meds:  Scheduled Meds:  acyclovir  400 mg Oral Daily   feeding supplement  237 mL Oral TID BM   megestrol  400 mg Oral BID   midodrine  2.5 mg Oral TID WC   multivitamin with minerals  1 tablet Oral Daily   pantoprazole  40 mg Oral Daily   rosuvastatin  5 mg Oral Daily   sertraline  100 mg Oral q morning   warfarin  10 mg Oral ONCE-1600   Warfarin - Pharmacist Dosing Inpatient   Does not apply q1600   Continuous Infusions:   PRN Meds:.acetaminophen **OR** [DISCONTINUED] acetaminophen, ondansetron **OR** ondansetron (ZOFRAN) IV  Antimicrobials: Anti-infectives (From admission, onward)    Start     Dose/Rate Route Frequency Ordered Stop   06/15/22 1200  vancomycin (VANCOREADY) IVPB 1250 mg/250 mL  Status:  Discontinued        1,250 mg 166.7 mL/hr over 90 Minutes Intravenous Every 36 hours 06/14/22 0228 06/14/22 0959   06/14/22 2200  vancomycin (VANCOCIN) IVPB 1000 mg/200 mL premix  Status:  Discontinued        1,000 mg 200 mL/hr over 60 Minutes Intravenous Every 24 hours 06/14/22 0959 06/17/22 0650   06/14/22 1500  ceFEPIme (MAXIPIME) 2 g in sodium chloride 0.9 % 100 mL IVPB  Status:  Discontinued        2 g 200 mL/hr over 30 Minutes Intravenous Every 12 hours 06/14/22 0220 06/17/22 0650   06/14/22 1000  acyclovir (ZOVIRAX) tablet 400 mg        400 mg Oral Daily 06/14/22 0346     06/14/22 0230  metroNIDAZOLE (FLAGYL) IVPB 500 mg  Status:  Discontinued        500 mg 100 mL/hr over 60 Minutes Intravenous Every 12 hours 06/14/22 0206 06/17/22 0650   06/13/22 2300  vancomycin (VANCOREADY) IVPB 1500 mg/300 mL        1,500 mg 150 mL/hr over 120 Minutes Intravenous  Once 06/13/22 2255 06/14/22 0124   06/13/22 2300  ceFEPIme  (MAXIPIME) 2 g in sodium chloride 0.9 % 100 mL IVPB        2 g 200 mL/hr over 30 Minutes Intravenous  Once 06/13/22 2255 06/15/22 0011        I have personally reviewed the following labs and images: CBC: Recent Labs  Lab 06/13/22 2100 06/14/22 0531 06/15/22 0337 06/16/22 0450 06/17/22 0302  WBC 0.5* 0.7* 0.9* 4.9 7.0  NEUTROABS 0.2* 0.2* 0.2* 2.9 6.4  HGB 11.6* 10.0* 9.5* 9.2* 8.9*  HCT 35.1* 32.9* 29.4* 28.6* 27.2*  MCV 96.7 104.1* 97.7 97.9 96.5  PLT 47* 43* 35* 33* 33*   BMP &GFR Recent Labs  Lab 06/13/22 2100 06/14/22 0748 06/15/22  1157 06/16/22 0450 06/17/22 0302  NA 139 140 139 140 141  K 4.1 4.1 3.7 3.8 3.6  CL 107 111 110 114* 114*  CO2 $Re'24 22 23 22 24  'pRB$ GLUCOSE 100* 93 128* 106* 93  BUN 41* 35* 33* 24* 18  CREATININE 2.07* 1.67* 1.40* 1.23 1.09  CALCIUM 8.8* 8.2* 8.1* 7.9* 8.1*  MG  --  1.8 1.9 1.7 1.8  PHOS  --   --  3.0  --  2.1*   Estimated Creatinine Clearance: 59.5 mL/min (by C-G formula based on SCr of 1.09 mg/dL). Liver & Pancreas: Recent Labs  Lab 06/13/22 2100 06/14/22 0748 06/15/22 0337 06/16/22 0450 06/17/22 0302  AST 85* 69* 70* 53* 62*  ALT 46* 36 37 32 39  ALKPHOS 157* 123 129* 112 115  BILITOT 0.5 0.6 0.5 0.6 0.5  PROT 6.1* 4.9* 4.9* 4.5* 4.6*  ALBUMIN 3.3* 2.7* 2.7* 2.4* 2.5*   Recent Labs  Lab 06/13/22 2100  LIPASE 69*   Recent Labs  Lab 06/13/22 2100  AMMONIA 14   Diabetic: No results for input(s): "HGBA1C" in the last 72 hours. Recent Labs  Lab 06/13/22 1949 06/14/22 2226 06/15/22 0200  GLUCAP 106* 86 133*   Cardiac Enzymes: Recent Labs  Lab 06/14/22 0846  CKTOTAL 124   No results for input(s): "PROBNP" in the last 8760 hours. Coagulation Profile: Recent Labs  Lab 06/13/22 2200 06/14/22 0531 06/15/22 0337 06/16/22 0450 06/17/22 0302  INR 3.5* 4.2* 3.7* 2.0* 1.8*   Thyroid Function Tests: Recent Labs    06/15/22 0337  TSH 2.271   Lipid Profile: Recent Labs    06/17/22 0302  TRIG 195*    Anemia Panel: Recent Labs    06/15/22 0337  VITAMINB12 3,216*  FOLATE 7.1  FERRITIN >7,500*  TIBC 162*  IRON 71  RETICCTPCT 0.6   Urine analysis:    Component Value Date/Time   COLORURINE YELLOW 06/13/2022 2300   APPEARANCEUR HAZY (A) 06/13/2022 2300   LABSPEC 1.017 06/13/2022 2300   PHURINE 5.0 06/13/2022 2300   GLUCOSEU NEGATIVE 06/13/2022 2300   GLUCOSEU NEGATIVE 08/11/2017 0843   HGBUR MODERATE (A) 06/13/2022 2300   HGBUR negative 03/20/2010 0843   BILIRUBINUR NEGATIVE 06/13/2022 2300   BILIRUBINUR n 03/22/2011 0000   KETONESUR NEGATIVE 06/13/2022 2300   PROTEINUR 100 (A) 06/13/2022 2300   UROBILINOGEN 0.2 08/11/2017 0843   NITRITE NEGATIVE 06/13/2022 2300   LEUKOCYTESUR NEGATIVE 06/13/2022 2300   Sepsis Labs: Invalid input(s): "PROCALCITONIN", "LACTICIDVEN"  Microbiology: Recent Results (from the past 240 hour(s))  SARS Coronavirus 2 by RT PCR (hospital order, performed in [facility] hospital lab) *cepheid single result test* Anterior Nasal Swab     Status: None   Collection Time: 06/13/22  8:26 PM   Specimen: Anterior Nasal Swab  Result Value Ref Range Status   SARS Coronavirus 2 by RT PCR NEGATIVE NEGATIVE Final    Comment: (NOTE) SARS-CoV-2 target nucleic acids are NOT DETECTED.  The SARS-CoV-2 RNA is generally detectable in upper and lower respiratory specimens during the acute phase of infection. The lowest concentration of SARS-CoV-2 viral copies this assay can detect is 250 copies / mL. A negative result does not preclude SARS-CoV-2 infection and should not be used as the sole basis for treatment or other patient management decisions.  A negative result may occur with improper specimen collection / handling, submission of specimen other than nasopharyngeal swab, presence of viral mutation(s) within the areas targeted by this assay, and inadequate number of viral  copies (<250 copies / mL). A negative result must be combined with  clinical observations, patient history, and epidemiological information.  Fact Sheet for Patients:   https://www.patel.info/  Fact Sheet for Healthcare Providers: https://hall.com/  This test is not yet approved or  cleared by the [place] FDA and has been authorized for detection and/or diagnosis of SARS-CoV-2 by FDA under an Emergency Use Authorization (EUA).  This EUA will remain in effect (meaning this test can be used) for the duration of the COVID-19 declaration under Section 564(b)(1) of the Act, 21 U.S.C. section 360bbb-3(b)(1), unless the authorization is terminated or revoked sooner.  Performed at [facility], [place] [address] [place], [place] [address]   Culture, blood (Routine X 2) w Reflex to ID Panel     Status: None (Preliminary result)   Collection Time: 06/14/22  8:36 AM   Specimen: BLOOD RIGHT HAND  Result Value Ref Range Status   Specimen Description   Final    BLOOD RIGHT HAND Performed at [place] [address], [place], [place] [address]    Special Requests   Final    IN PEDIATRIC BOTTLE Blood Culture adequate volume Performed at [place] [address], [place], [place] [address]    Culture   Final    NO GROWTH 3 DAYS Performed at [facility], [place] [address], [place], [place] [address]    Report Status PENDING  Incomplete  Culture, blood (Routine X 2) w Reflex to ID Panel     Status: None (Preliminary result)   Collection Time: 06/14/22  8:46 AM   Specimen: BLOOD  Result Value Ref Range Status   Specimen Description   Final    BLOOD BLOOD LEFT FOREARM Performed at [place] [address], [place], [place] [address]    Special Requests   Final    BOTTLES DRAWN AEROBIC ONLY Blood Culture results may not be optimal due to an inadequate volume of blood received in culture bottles Performed at [place] [address] [place], [place] [address]    Culture   Final    NO GROWTH 3 DAYS Performed at [facility], [place] [address] [place], [place] [address]    Report Status PENDING  Incomplete    Radiology Studies: No results found.    Shakhia Gramajo T. [place]  If 7PM-7AM, please contact night-coverage [email or web address] 06/17/2022, 2:03 PM

## 2022-06-17 NOTE — Progress Notes (Addendum)
[name] does look a little bit better this morning.  He sounds a little bit better this morning.  His white cell counts come up quite nicely with the Neupogen.  He is quite anemic and thrombocytopenic.  Hopefully, the bone marrow biopsy results will have a preliminary today.  He has been afebrile.  I think we will probably stop his antibiotics.  He does need physical therapy.  They saw him on Saturday.  I appreciate their help.  He now is on Megace to try to help with his appetite.  Hopefully, he will eat a little bit more.  I know that he has some going on with his liver from the MRI.  It is hard to say if there is anything in there.  I do not know if he will need to have a CT scan done.  His chemistry labs show his SGPT to be 39 SGOT 62.  His BUN is 18 creatinine 1.09.  This is normalized now.  His iron studies show a ferritin greater than 7500.  I had believe this is all inflammatory.  Iron saturation is only 44%.  His vitamin B12 level is 3200.  His IgG level is 438.  And this might be a little on the lower side.  Maybe, he would benefit from some IVIG.  His white count is 7.  Hemoglobin 8.9.  Platelet count 33,000.  It is still not totally clear as to what is happened with [name].  There is still some change in his mental state.  He still has a little hard time trying to say what he wants to say.  I know the MRI of the brain was pretty much unremarkable.  So far cultures have all been unremarkable.  His vital signs show temperature of 98.4.  Pulse 67.  Blood pressure 104/57.  Oral exam is unremarkable.  There is no mucositis.  Neck is without adenopathy.  Lungs are clear.  Cardiac exam regular rate and rhythm.  Has a 2/6 systolic ejection murmur.  Abdomen is soft.  There is slightly decreased bowel sounds.  There is no fluid wave.  There is no palpable liver or spleen tip.  Extremity shows no clubbing, cyanosis or edema.  Skin exam shows no ecchymoses or petechia.  There are no rashes.   Neurological exam shows no focal deficits.  Again, he still has a little bit of difficulty with what he wants to say.  I know he has the severe aortic stenosis by echocardiogram.  I appreciate Cardiology following this.  As always, they make very good recommendations.  I will try him on IVIG.  Is interesting that his ferritin is so high.  I will see anything that looks like [place].  These are does not appear to be sick which we often see which [place].  Again, the bone marrow will certainly be helpful.  Hopefully, he will be able to increase his activity level.  I know that physical therapy will help out quite a bit.  Maybe, if he makes slow but steady improvement, he will be able to go home sometime this week.  I do appreciate everybody's help on 4 W.  I know that he is getting incredibly good care.  Lattie Haw, MD  Psalms 71:14

## 2022-06-17 NOTE — TOC Progression Note (Signed)
Transition of Care [facility]) - Progression Note    Patient Details  Name: DEKARI BURES MRN: [id number] Date of Birth: Aug 22, 1946  Transition of Care [facility]) CM/SW [place], RN Phone Number: 06/17/2022, 4:09 PM  Clinical Narrative:   RNCM spoke with patient's niece Rod Holler with bed offers. Rod Holler requested all options be emailed to her and will contact this RNCM in the am. RNCM advised once SNF option is chosen and facility accepts, will need insurance auth. Rod Holler verbalized understanding.  TOC will continue to follow.       Expected Discharge Plan: Skilled Nursing Facility Barriers to Discharge: Ship broker, Continued Medical Work up  Expected Discharge Plan and Services Expected Discharge Plan: [place] Choice: [place] arrangements for the past 2 months: Single Family Home                  Social Determinants of Health (SDOH) Interventions    Readmission Risk Interventions    06/16/2022    5:17 PM  Readmission Risk Prevention Plan  Transportation Screening Complete  PCP or Specialist Appt within 3-5 Days Complete  HRI or Home Care Consult Complete  Social Work Consult for [place] Planning/Counseling Complete  Palliative Care Screening Not Applicable  Medication Review Press photographer) Referral to Pharmacy

## 2022-06-17 NOTE — Plan of Care (Signed)

## 2022-06-17 NOTE — Progress Notes (Signed)
EEG complete - results pending 

## 2022-06-17 NOTE — Progress Notes (Signed)
Physical Therapy Treatment Patient Details Name: [name] MRN: [id number] DOB: 09/10/1946 Today's Date: 06/17/2022   History of Present Illness 76 yo male presents to ED with AMS, weakness x3 days. +  leukopenia and thrombocytopenia. Pt receives chemotherapy for CLL, last chemo >2 months ago. s/p CT guided bone marrow biopsy on 8/4. seizure like activity 8/4. PMH includes AVR, CKDIII, PE, afib, HLD, HTN.    PT Comments    Pt assisted with ambulating short distance in hallway.  Pt requiring cues for safety.  Pt denies dizziness with mobility today but did fatigue quickly.  Continue to recommend SNF upon d/c.    Recommendations for follow up therapy are one component of a multi-disciplinary discharge planning process, led by the attending physician.  Recommendations may be updated based on patient status, additional functional criteria and insurance authorization.  Follow Up Recommendations  Skilled nursing-short term rehab (<3 hours/day) Can patient physically be transported by private vehicle: Yes   Assistance Recommended at Discharge Intermittent Supervision/Assistance  Patient can return home with the following A little help with walking and/or transfers;A little help with bathing/dressing/bathroom;Assistance with cooking/housework;Direct supervision/assist for medications management;Help with stairs or ramp for entrance;Direct supervision/assist for financial management   Equipment Recommendations  None recommended by PT    Recommendations for Other Services       Precautions / Restrictions Precautions Precautions: Fall Precaution Comments: orthostatic hypotension - compression garments, abdominal binder donned before mobility and RN reports pt received midodrine     Mobility  Bed Mobility Overal bed mobility: Needs Assistance Bed Mobility: Supine to Sit     Supine to sit: Supervision, HOB elevated     General bed mobility comments: supervision for safety, pt  utilized rails and elevated HOB    Transfers Overall transfer level: Needs assistance Equipment used: None Transfers: Sit to/from Stand Sit to Stand: Min assist           General transfer comment: cues for safety, pt attempting to get up quickly to use bathroom despite discussion just previously about orthostatic hypotension and reason for donning TED hose (pt already wearing abdominal binder upon entering room); assist to control descent    Ambulation/Gait Ambulation/Gait assistance: Min assist, Min guard Gait Distance (Feet): 50 Feet Assistive device: Rolling walker (2 wheels) Gait Pattern/deviations: Step-through pattern, Decreased stride length, Trunk flexed, Shuffle Gait velocity: decr     General Gait Details: pt initially ambulated into bathroom and a little unsteady however no physical assist required, pt holding onto wall and door frame upon leaving bathroom but denied dizziness so provided RW for stability, pt then ambulated in hallway, pt again reports no dizziness with movement but fatigued quickly   Stairs             Wheelchair Mobility    Modified Rankin (Stroke Patients Only)       Balance                                            Cognition Arousal/Alertness: Awake/alert Behavior During Therapy: Flat affect Overall Cognitive Status: Impaired/Different from baseline                       Memory: Decreased short-term memory Following Commands: Follows one step commands with increased time     Problem Solving: Slow processing General Comments: pt agreeable to mobilize however appeared a  little irritated with question asking (dizziness, use of RW, etc)        Exercises      General Comments        Pertinent Vitals/Pain Pain Assessment Pain Assessment: No/denies pain    Home Living                          Prior Function            PT Goals (current goals can now be found in the care plan  section) Progress towards PT goals: Progressing toward goals    Frequency    Min 3X/week      PT Plan Current plan remains appropriate    Co-evaluation              AM-PAC PT "6 Clicks" Mobility   Outcome Measure  Help needed turning from your back to your side while in a flat bed without using bedrails?: A Little Help needed moving from lying on your back to sitting on the side of a flat bed without using bedrails?: A Little Help needed moving to and from a bed to a chair (including a wheelchair)?: A Little Help needed standing up from a chair using your arms (e.g., wheelchair or bedside chair)?: A Little Help needed to walk in hospital room?: A Little Help needed climbing 3-5 steps with a railing? : A Lot 6 Click Score: 17    End of Session Equipment Utilized During Treatment: Gait belt Activity Tolerance: Patient limited by fatigue Patient left: with call bell/phone within reach;with chair alarm set;in chair Nurse Communication: Mobility status PT Visit Diagnosis: Other abnormalities of gait and mobility (R26.89);Muscle weakness (generalized) (M62.81)     Time: 7341-9379 PT Time Calculation (min) (ACUTE ONLY): 20 min  Charges:  $Gait Training: 8-22 mins                     Arlyce Dice, DPT Physical Therapist Acute Rehabilitation Services Preferred contact method: Secure Chat Weekend Pager Only: [phone] Office: [place] 06/17/2022, 3:40 PM

## 2022-06-18 ENCOUNTER — Other Ambulatory Visit (HOSPITAL_COMMUNITY): Payer: Medicare Other

## 2022-06-18 ENCOUNTER — Encounter (HOSPITAL_COMMUNITY): Payer: Self-pay | Admitting: Hematology & Oncology

## 2022-06-18 DIAGNOSIS — R4182 Altered mental status, unspecified: Secondary | ICD-10-CM | POA: Diagnosis not present

## 2022-06-18 DIAGNOSIS — N179 Acute kidney failure, unspecified: Secondary | ICD-10-CM | POA: Diagnosis not present

## 2022-06-18 DIAGNOSIS — Z86711 Personal history of pulmonary embolism: Secondary | ICD-10-CM | POA: Diagnosis not present

## 2022-06-18 DIAGNOSIS — D709 Neutropenia, unspecified: Secondary | ICD-10-CM | POA: Diagnosis not present

## 2022-06-18 DIAGNOSIS — C9110 Chronic lymphocytic leukemia of B-cell type not having achieved remission: Secondary | ICD-10-CM | POA: Diagnosis not present

## 2022-06-18 DIAGNOSIS — R531 Weakness: Secondary | ICD-10-CM | POA: Diagnosis not present

## 2022-06-18 DIAGNOSIS — R5383 Other fatigue: Secondary | ICD-10-CM | POA: Diagnosis not present

## 2022-06-18 DIAGNOSIS — I951 Orthostatic hypotension: Secondary | ICD-10-CM

## 2022-06-18 LAB — CBC WITH DIFFERENTIAL/PLATELET
Abs Immature Granulocytes: 0.01 10*3/uL (ref 0.00–0.07)
Basophils Absolute: 0 10*3/uL (ref 0.0–0.1)
Basophils Relative: 0 %
Eosinophils Absolute: 0 10*3/uL (ref 0.0–0.5)
Eosinophils Relative: 0 %
HCT: 30 % — ABNORMAL LOW (ref 39.0–52.0)
Hemoglobin: 9.8 g/dL — ABNORMAL LOW (ref 13.0–17.0)
Immature Granulocytes: 0 %
Lymphocytes Relative: 50 %
Lymphs Abs: 2.5 10*3/uL (ref 0.7–4.0)
MCH: 31.5 pg (ref 26.0–34.0)
MCHC: 32.7 g/dL (ref 30.0–36.0)
MCV: 96.5 fL (ref 80.0–100.0)
Monocytes Absolute: 0.2 10*3/uL (ref 0.1–1.0)
Monocytes Relative: 3 %
Neutro Abs: 2.5 10*3/uL (ref 1.7–7.7)
Neutrophils Relative %: 47 %
Platelets: 42 10*3/uL — ABNORMAL LOW (ref 150–400)
RBC: 3.11 MIL/uL — ABNORMAL LOW (ref 4.22–5.81)
RDW: 14.2 % (ref 11.5–15.5)
WBC: 5.2 10*3/uL (ref 4.0–10.5)
nRBC: 0 % (ref 0.0–0.2)

## 2022-06-18 LAB — LYME DISEASE, WESTERN BLOT
IgG P18 Ab.: ABSENT
IgG P23 Ab.: ABSENT
IgG P28 Ab.: ABSENT
IgG P30 Ab.: ABSENT
IgG P39 Ab.: ABSENT
IgG P45 Ab.: ABSENT
IgG P58 Ab.: ABSENT
IgG P66 Ab.: ABSENT
IgG P93 Ab.: ABSENT
IgM P23 Ab.: ABSENT
IgM P39 Ab.: ABSENT
IgM P41 Ab.: ABSENT
Lyme IgG Wb: NEGATIVE
Lyme IgM Wb: NEGATIVE

## 2022-06-18 LAB — CK: Total CK: 29 U/L — ABNORMAL LOW (ref 49–397)

## 2022-06-18 LAB — RENAL FUNCTION PANEL
Albumin: 2.6 g/dL — ABNORMAL LOW (ref 3.5–5.0)
Anion gap: 5 (ref 5–15)
BUN: 14 mg/dL (ref 8–23)
CO2: 22 mmol/L (ref 22–32)
Calcium: 8.2 mg/dL — ABNORMAL LOW (ref 8.9–10.3)
Chloride: 115 mmol/L — ABNORMAL HIGH (ref 98–111)
Creatinine, Ser: 1.24 mg/dL (ref 0.61–1.24)
GFR, Estimated: >60 mL/min (ref >60)
Glucose, Bld: 96 mg/dL (ref 70–99)
Phosphorus: 2.2 mg/dL — ABNORMAL LOW (ref 2.5–4.6)
Potassium: 3.5 mmol/L (ref 3.5–5.1)
Sodium: 142 mmol/L (ref 135–145)

## 2022-06-18 LAB — PROTIME-INR
INR: 2.7 — ABNORMAL HIGH (ref 0.8–1.2)
Prothrombin Time: 28.4 seconds — ABNORMAL HIGH (ref 11.4–15.2)

## 2022-06-18 LAB — ROCKY MTN SPOTTED FVR ABS PNL(IGG+IGM)
RMSF IgG: UNDETERMINED
RMSF IgM: 0.65 index (ref 0.00–0.89)

## 2022-06-18 LAB — VITAMIN B1: Vitamin B1 (Thiamine): 67 nmol/L (ref 66.5–200.0)

## 2022-06-18 LAB — RMSF, IGG, IFA: RMSF, IGG, IFA: 1:128 {titer} — ABNORMAL HIGH

## 2022-06-18 LAB — SURGICAL PATHOLOGY

## 2022-06-18 LAB — MAGNESIUM: Magnesium: 1.8 mg/dL (ref 1.7–2.4)

## 2022-06-18 MED ORDER — COSYNTROPIN 0.25 MG IJ SOLR
0.2500 mg | Freq: Once | INTRAMUSCULAR | Status: AC
  Administered 2022-06-19: 0.25 mg via INTRAVENOUS
  Filled 2022-06-18: qty 0.25

## 2022-06-18 MED ORDER — DEXTROSE 5 % IV SOLN
30.0000 mmol | Freq: Once | INTRAVENOUS | Status: AC
  Administered 2022-06-18: 30 mmol via INTRAVENOUS
  Filled 2022-06-18: qty 10

## 2022-06-18 NOTE — Progress Notes (Signed)
Mobility Specialist - Progress Note    06/18/22 1300  Orthostatic Lying   BP- Lying 139/82  Orthostatic Sitting  BP- Sitting 108/79  Mobility  Activity Ambulated with assistance in room  Level of Assistance Contact guard assist, steadying assist  Assistive Device Front wheel walker  Distance Ambulated (ft) 10 ft  Activity Response Tolerated well  $Mobility charge 1 Mobility   Pt was agreeable to mobilize and was found lying in bed. Pt was persistent in getting in touch with his niece and kept repeating her phone number. Pt required cues when getting up from bed and while ambulating in room. Stated feeling tired after only taking a couple steps, was very shaky and not steady. Pt was left on the bed with all necessities in reach and with a visitor in the room.  Ferd Hibbs Mobility Specialist

## 2022-06-18 NOTE — Plan of Care (Signed)
  Problem: Education: Goal: Knowledge of General Education information will improve Description Including pain rating scale, medication(s)/side effects and non-pharmacologic comfort measures Outcome: Progressing   Problem: Health Behavior/Discharge Planning: Goal: Ability to manage health-related needs will improve Outcome: Progressing   

## 2022-06-18 NOTE — Plan of Care (Signed)

## 2022-06-18 NOTE — Progress Notes (Signed)
OT Cancellation Note  Patient Details Name: EUGENE ISADORE MRN: [id number] DOB: Aug 11, 1946   Cancelled Treatment:    Reason Eval/Treat Not Completed: Patient at procedure or test/ unavailable Patient is with labs and nursing at this time for testing in room. OT to continue to follow and check back as schedule will allow.  Jackelyn Poling OTR/L, [place] Acute Rehabilitation Department Office# [phone] Pager# [phone]  06/18/2022, 8:07 AM

## 2022-06-18 NOTE — TOC Progression Note (Signed)
Transition of Care [facility]) - Progression Note    Patient Details  Name: [name] MRN: [id number] Date of Birth: Apr 14, 1946  [facility]) CM/SW Contact  Shanyn Preisler, Juliann Pulse, RN Phone Number: 06/18/2022, 2:56 PM  Clinical Narrative:Received [place] VZ#[id number],JOIN [id number] eff from 8/7-8/10 for [place] SNF.       Expected Discharge Plan: [place] Barriers to Discharge: Continued Medical Work up  Expected Discharge Plan and Services Expected Discharge Plan: [place] Choice: [place] arrangements for the past 2 months: Single Family Home                                       Social Determinants of Health (SDOH) Interventions    Readmission Risk Interventions    06/16/2022    5:17 PM  Readmission Risk Prevention Plan  Transportation Screening Complete  PCP or Specialist Appt within 3-5 Days Complete  HRI or [place] Complete  Social Work Consult for [place] Planning/Counseling Complete  Palliative Care Screening Not Applicable  Medication Review Press photographer) Referral to Pharmacy

## 2022-06-18 NOTE — Progress Notes (Signed)
[place] for Warfarin Indication: h/o recurrent PE, bioprosthetic AVR, PAFib  Allergies  Allergen Reactions   Hydrocodone-Acetaminophen Shortness Of Breath   Oxycodone Hcl Shortness Of Breath   Telmisartan-Hctz Shortness Of Breath, Palpitations and Other (See Comments)    Dizziness also   Aspirin Rash and Other (See Comments)    Confusion, also- Cannot take high doses   Atorvastatin Other (See Comments)    Myalgia    Buspirone Hcl Other (See Comments)    Headaches    Codeine Hives, Nausea And Vomiting and Other (See Comments)    Confusion also   Morphine Sulfate Itching   Paroxetine Other (See Comments)    Paresthesias: R arm, R leg, r side of face   Rabeprazole Other (See Comments)    headache   Ramipril Other (See Comments)    Unknown reaction    Patient Measurements: Height: '5\' 10"'$  (177.8 cm) Weight: 81.7 kg (180 lb 1.9 oz) IBW/kg (Calculated) : 73  Vital Signs: Temp: 97.7 F (36.5 C) (08/08 0514) Temp Source: Oral (08/08 0514) BP: 106/82 (08/08 0514) Pulse Rate: 76 (08/08 0514)  Labs: Recent Labs    06/16/22 0450 06/17/22 0302 06/18/22 0824  HGB 9.2* 8.9* 9.8*  HCT 28.6* 27.2* 30.0*  PLT 33* 33* 42*  LABPROT 22.7* 21.0* 28.4*  INR 2.0* 1.8* 2.7*  CREATININE 1.23 1.09 1.24  CKTOTAL  --   --  29*     Estimated Creatinine Clearance: 52.3 mL/min (by C-G formula based on SCr of 1.24 mg/dL).  Medications:  PTA Warfarin  Assessment: 76 yr male with CLL undergoing chemotherapy.  PMH significant for bioprosthetic AVR, CKD, recurent PE, PAF Med history documented patient states takes warfarin occasionally and is unsure of how was supposed to be taking this medication. Stated he took Warfarin '15mg'$  on Fri and Sat, then '10mg'$  on Mon, Wed and Thur Per Onc note 7/7, dose listed '15mg'$  Sun/Thu, '10mg'$  all other days  Today, 06/18/2022 INR now therapeutic after several days of no warfarin this admission due to supratherapeutic  values, '10mg'$  warfarin given last 2 days. But INR had large jump compared to yesterday 1.8 >> 2.7 CBC: hgb 9.8 up; Plt 42 up some but stable; TRH/Onc aware, continuing anticoag No bleeding reported per RN Possible drug interaction with metronidazole (stopped 8/7; got 4 days worth - can increase warfarin sensitivity)  Goal of Therapy:  INR goal 2.5-3.0 per Onc note 7/7   Plan:  Suspect large INR jump likely due to concurrent flagyl give previous 4 days and stopped yesterday.  Hold warfarin today Daily PT/INR   Adrian Saran, PharmD, BCPS Secure Chat if ?s or find phone# per room assignments 06/18/2022 10:58 AM

## 2022-06-18 NOTE — Progress Notes (Signed)
Overall, he is about the same.  I still think there is some thing neurologically going on.  His memory is not all that good.  He is a little confused.  Maybe, another MRI of the brain can be done to see if there is any changes.  He is having issues with orthostatic hypotension.  He has a brace on his chest.  There is no labs back yet.  The bone marrow biopsy results not yet back.  He did a little bit of physical therapy yesterday.  I am unsure how much he really is eating.  I did start him on some Megace to try to help with his appetite.  He has had no fever.  He is off antibiotics.  He has been tested for adrenal insufficiency.  Vital signs show temperature 97.7.  Pulse 76.  Blood pressure 106/82.  His head neck exam shows no ocular or oral lesions.  There is no adenopathy.  Lungs are clear bilaterally.  Cardiac exam regular rate and rhythm.  He has 1/6 systolic ejection murmur.  Abdomen is soft.  Bowel sounds are present.  There is no guarding or rebound tenderness.  Extremity shows no clubbing, cyanosis or edema.  Neurological exam shows no focal deficits.  Again, his overall cognition just does not seem to be at baseline.  I am still not clear as to what exactly has happened to Mr. Barella.  I thought to believe there is something that happened with respect to his neurological status.  He just does not seem to be as alert and oriented.  Again, I do not know if a MRI of the brain would help.  Looks like he is going to go to some kind of rehab facility.  He is not aware of this.  Again, his overall cognition just does not what it used to be.  I do appreciate the outstanding care that he is getting from the staff on [place], MD  1 Thessalonians 5:16-18

## 2022-06-18 NOTE — TOC Progression Note (Addendum)
Transition of Care [facility]) - Progression Note    Patient Details  Name: OMARR HANN MRN: [id number] Date of Birth: 1946/04/11  Transition [facility]) CM/SW Contact  Kaycie Pegues, Juliann Pulse, RN Phone Number: 06/18/2022, 11:21 AM  Clinical Narrative:  Insurance [place] initiated [place] JX#[id number] for Janyth Pupa to [place] coordinator-await auth.  -12:11p-spoke to [employer] rep Wendy-concerns about chemo/granix needed while @ SNF(family will need t transport/they cannot provide granix(expensive) left vm w/[name] nurse line-await call back;left vm w/[name](niece) also if aware of ongoing chemo,if has granix @ home, can prove transport to [facility] if ongoing treatments needed. On appt schedule-chemo 8/9 @ [facility]. Will await confirmation on. -12:27p-spoke to [place](Dr. Ennevers nurse) no further chemo or granix.spoke to Niece [name]-aware of [name] for [place]. Awaiting auth.   Expected Discharge Plan: Skilled Nursing Facility Barriers to Discharge: Ship broker, Continued Medical Work up  Expected Discharge Plan and Services Expected Discharge Plan: [place] Choice: [place] arrangements for the past 2 months: Single Family Home                                       Social Determinants of Health (SDOH) Interventions    Readmission Risk Interventions    06/16/2022    5:17 PM  Readmission Risk Prevention Plan  Transportation Screening Complete  PCP or Specialist Appt within 3-5 Days Complete  HRI or Home Care Consult Complete  Social Work Consult for [place] Planning/Counseling Complete  Palliative Care Screening Not Applicable  Medication Review Press photographer) Referral to Pharmacy

## 2022-06-18 NOTE — Procedures (Signed)
Patient Name: [name]  MRN: [id number]  Epilepsy Attending: Lora Havens  Referring Physician/Provider: Mercy Riding, MD  Date: 06/17/2022 Duration: 21.06 mins  Patient history: 76 year old male with altered mental status.  EEG to evaluate for seizure.  Level of alertness: Awake  AEDs during EEG study: None  Technical aspects: This EEG study was done with scalp electrodes positioned according to the 10-20 International system of electrode placement. Electrical activity was reviewed with band pass filter of 1-'70Hz'$ , sensitivity of 7 uV/mm, display speed of 37m/sec with a '60Hz'$  notched filter applied as appropriate. EEG data were recorded continuously and digitally stored.  Video monitoring was available and reviewed as appropriate.  Description: The posterior dominant rhythm consists of 6 Hz activity of moderate voltage (25-35 uV) seen predominantly in posterior head regions, symmetric and reactive to eye opening and eye closing. EEG showed continuous generalized 5 to 6 Hz theta slowing. Hyperventilation and photic stimulation were not performed.     ABNORMALITY - Continuous slow, generalized  IMPRESSION: This study is suggestive of moderate diffuse encephalopathy, nonspecific etiology. No seizures or epileptiform discharges were seen throughout the recording.  Ma Munoz OBarbra Sarks

## 2022-06-18 NOTE — Progress Notes (Signed)
PROGRESS NOTE  [name] VHU:[id number] DOB: Apr 13, 1946   PCP: Shelva Majestic, MD  Patient is from: Home.  Lives alone.  Independently ambulates at baseline.  DOA: 06/13/2022 LOS: 4  Chief complaints Chief Complaint  Patient presents with   Weakness   chemo patient     Brief Narrative / Interim history: 76 year old M with PMH of prostate cancer s/p prostatectomy, CLL, bioprosthetic AVR, recurrent PE on Coumadin, PAF and CKD-3B brought to ED after found down on his knees and hands by his friend.  Patient denied fall but fatigue.  No signs of serious injury.  In ED, mild temp to 99.6.  Otherwise vital stable.  WBC 0.5 with ANC of 200.  Hgb 11.6.  Platelet 47 (baseline in the 90s).  Cr 2.0 (from 1.5 about a months ago).  AST 85.  ALT 46.  Total bili 0.5.  ALP 157.  Lactic acid normal.  Troponin 63.  INR 3.5.  UA with moderate Hgb.  CT head and CXR without acute finding.  EKG sinus rhythm with PACs.  Oncology consulted.  Patient was started on broad-spectrum antibiotics and admitted for altered mental status, AKI and neutropenia.  TTE ordered.  In regards to pancytopenia/neutropenia.  Underwent bone marrow biopsy on 8/5.  Cultures negative.  He was given Granix, and neutropenia/leukopenia resolved.  Thrombocytopenia worse.  Antibiotics discontinued on 8/7.  Oncology following.  Patient had RUQ [name] as part of evaluation for elevated LFT that showed 8 mm right hepatic lobe lesion.  AFP and CEA negative. MRI liver was motion limited and showed innumerable small T2 hyperintense lesions throughout the liver suggesting regenerating nodules but no focal lesion or abnormal enhancement.  Follow-up contrast-enhanced CT was recommended.    Workup including MRI brain unrevealing.  It looks like he has cognitive decline.  He had seizure-like activity while getting an MRI.  EEG ordered.  Neurology consulted but symptoms felt to be from orthostatic hypotension versus seizure.  Orthostatic vitals were  positive.   TTE showed LVEF of 50%, GH, G2-DD, RVSP of 32.4, moderate LAE, severe bioprosthetic aortic valve stenosis.  Cardiology consulted and recommended follow-up with his cardiologist.   His a.m. cortisol was low.  Ordered ACTH stimulation test.  Bone marrow biopsy pending as well.  Therapy recommended SNF.    Subjective: Seen and examined earlier this morning.  No major events overnight of this morning.  No complaints.  Unfortunately, his ACHS H was not done this morning due to IV line infiltration although his ACTH level and other labs were drawn.  Objective: Vitals:   06/17/22 1321 06/17/22 1951 06/18/22 0514 06/18/22 1331  BP: 113/70 120/74 106/82 106/61  Pulse: 67 76 76 (!) 50  Resp: 18 20 18 18   Temp: 98.1 F (36.7 C) 98.2 F (36.8 C) 97.7 F (36.5 C) 97.6 F (36.4 C)  TempSrc: Oral Oral Oral Oral  SpO2: 99%  96% 98%  Weight:      Height:        Examination:  GENERAL: No apparent distress.  Nontoxic. HEENT: MMM.  Vision and hearing grossly intact.  NECK: Supple.  No apparent JVD.  RESP:  No IWOB.  Fair aeration bilaterally. CVS:  RRR.  2/6 SEM over RUSB.  Mechanical heart sounds. ABD/GI/GU: BS+. Abd soft, NTND.  MSK/EXT:  Moves extremities. No apparent deformity. No edema.  SKIN: no apparent skin lesion or wound NEURO: Awake and alert. Oriented appropriately but limited insight and recall.  No apparent focal neuro deficit. PSYCH:  Calm. Normal affect.   Procedures:  8/4-bone marrow biopsy  Microbiology summarized: COVID-19 PCR negative. Blood culture NGTD.  Assessment and plan: Principal Problem:   Altered mental status Active Problems:   Neutropenia ([place])   AKI (acute kidney injury) ([place])   Chronic lymphocytic leukemia (HCC)   S/P Bioprosthetic AVR (aortic valve replacement) in 2011   History of pulmonary embolism   Pancytopenia (HCC)   Transaminitis   Liver lesion, right lobe   AMS (altered mental status)   Severe aortic stenosis   Chronic  diastolic CHF (congestive heart failure) (HCC)   Seizure-like activity (HCC)   Orthostatic hypotension  Altered mental status: Unclear how long this has been going on.  Looks like cognitive decline.  He has orthostatic hypotension as well.  Per patient's aunt, his cognition seems to be the same as when she saw him 5 months ago.  Has no focal neurodeficit.  He is oriented x4 but limited insight and recalls.  CT head and MRI without acute finding.  He has no focal neuro symptoms.  No signs of meningism.  CT head, MRI brain, ammonia, B12 and TSH unrevealing.  He has history of CLL.  -Follow Lyme/tickborne disease serologies but no report of tick bite. -Reorientation and delirium precautions  Seizure-like activity-Reportedly had shaking and urinary incontinence while getting MRI. felt to be from orthostatic hypotension versus seizure.  EEG suggests moderate diffuse encephalopathy but no seizure or epileptiform discharge. -Neurology signed off.  Severe aortic valve stenosis inpatient with Bioprosthetic AVR-noted on TTE.  Per aunt, DOE and "tremors" with exertion.  He might be having DOE and presyncope.  However, cardiology felt his TTE finding are stable compared to his TTE in August 2022.  Patient is followed by Dr. [name] outpatient. -Appreciate input by cardiology -No further inpatient evaluation but outpatient follow-up for valve in valve TAVR consideration  Orthostatic hypotension: Probably contributing to his tremors with activity and exertion and DOE.  Orthostatic vitals positive.  TTE as above.  TSH within normal.  This could be due to aortic stenosis and a/or autonomic dysfunction.  His a.m. cortisol is low. -Appreciate help by cardiology-compression garments, abdominal binder and midodrine -Elevate head of bed to at least 30 degree -Follow ACTH level. -Reordered ACTH stimulation test for 8/9 -Fall precaution -Therapy recommended SNF.  AKI: Likely prerenal.  Initially thought to have CKD.   CKD ruled out. Recent Labs    03/28/22 0855 04/10/22 1049 05/08/22 1007 05/22/22 0901 06/13/22 2100 06/14/22 0748 06/15/22 0337 06/16/22 0450 06/17/22 0302 06/18/22 0824  BUN 27* 24* 27* 32* 41* 35* 33* 24* 18 14  CREATININE 1.29* 1.27* 1.28* 1.53* 2.07* 1.67* 1.40* 1.23 1.09 1.24  -Monitor off IV fluid -Monitor  Neutropenia/pancytopenia/CLL: Reportedly has not had chemotherapy in 2 months.  Started on Granix on 8/5.  Leukopenia/neutropenia resolved.  Anemia stable.  Thrombocytopenia improved.  Blood cultures negative.  Antibiotic discontinued. -Continue home acyclovir -Follow bone marrow biopsy result -Oncology following.  Liver lesion/mild transaminitis: LFT improved.  Colonoscopy in 2014 with mild diverticulosis, and no mentions of polyps. MRI liver was motion limited and showed innumerable small T2 hyperintense lesions throughout the liver suggesting regenerating nodules but no focal lesion or abnormal enhancement.  AFP and CEA negative. -Follow-up contrast-enhanced CT was recommended-likely outpatient. -Continue monitoring  History of pulmonary embolism/supratherapeutic INR: INR 2.7. -Warfarin per pharmacy  Paroxysmal A-fib: Currently in sinus rhythm.  Not on rate or rhythm control -Warfarin per pharmacy  Chronic diastolic CHF: TTE with LVEF of 50%,  GH, G2-DD and severe aortic stenosis.  Appears euvolemic on exam.  Not on diuretics at home.  Denies dyspnea or chest pain. -Monitor fluid status   Generalized weakness/fatigue: Independently ambulates at baseline. -PT/OT-recommended SNF.  Hypophosphatemia: Replenish and recheck.  Decreased oral intake Body mass index is 25.84 kg/m. Nutrition Problem: Inadequate oral intake Etiology: decreased appetite Signs/Symptoms: per patient/family report Interventions: Ensure Enlive (each supplement provides 350kcal and 20 grams of protein), MVI   DVT prophylaxis:  Place TED hose Start: 06/15/22 1256 SCDs Start: 06/14/22 0347    Code Status: Full code Family Communication: None at the bedside today Level of care: Telemetry Status is: Inpatient The patient will remain inpatient because: Orthostatic hypotension and mental status change   Final disposition: SNF Consultants:  Oncology Neurology-signed off Cardiology  Sch Meds:  Scheduled Meds:  acyclovir  400 mg Oral Daily   [START ON 06/19/2022] cosyntropin  0.25 mg Intravenous Once   feeding supplement  237 mL Oral TID BM   megestrol  400 mg Oral BID   midodrine  2.5 mg Oral TID WC   multivitamin with minerals  1 tablet Oral Daily   pantoprazole  40 mg Oral Daily   rosuvastatin  5 mg Oral Daily   sertraline  100 mg Oral q morning   Warfarin - Pharmacist Dosing Inpatient   Does not apply q1600   Continuous Infusions:  potassium PHOSPHATE IVPB (in mmol)      PRN Meds:.acetaminophen **OR** [DISCONTINUED] acetaminophen, ondansetron **OR** ondansetron (ZOFRAN) IV  Antimicrobials: Anti-infectives (From admission, onward)    Start     Dose/Rate Route Frequency Ordered Stop   06/15/22 1200  vancomycin (VANCOREADY) IVPB 1250 mg/250 mL  Status:  Discontinued        1,250 mg 166.7 mL/hr over 90 Minutes Intravenous Every 36 hours 06/14/22 0228 06/14/22 0959   06/14/22 2200  vancomycin (VANCOCIN) IVPB 1000 mg/200 mL premix  Status:  Discontinued        1,000 mg 200 mL/hr over 60 Minutes Intravenous Every 24 hours 06/14/22 0959 06/17/22 0650   06/14/22 1500  ceFEPIme (MAXIPIME) 2 g in sodium chloride 0.9 % 100 mL IVPB  Status:  Discontinued        2 g 200 mL/hr over 30 Minutes Intravenous Every 12 hours 06/14/22 0220 06/17/22 0650   06/14/22 1000  acyclovir (ZOVIRAX) tablet 400 mg        400 mg Oral Daily 06/14/22 0346     06/14/22 0230  metroNIDAZOLE (FLAGYL) IVPB 500 mg  Status:  Discontinued        500 mg 100 mL/hr over 60 Minutes Intravenous Every 12 hours 06/14/22 0206 06/17/22 0650   06/13/22 2300  vancomycin (VANCOREADY) IVPB 1500 mg/300 mL         1,500 mg 150 mL/hr over 120 Minutes Intravenous  Once 06/13/22 2255 06/14/22 0124   06/13/22 2300  ceFEPIme (MAXIPIME) 2 g in sodium chloride 0.9 % 100 mL IVPB        2 g 200 mL/hr over 30 Minutes Intravenous  Once 06/13/22 2255 06/15/22 0011        I have personally reviewed the following labs and images: CBC: Recent Labs  Lab 06/14/22 0531 06/15/22 0337 06/16/22 0450 06/17/22 0302 06/18/22 0824  WBC 0.7* 0.9* 4.9 7.0 5.2  NEUTROABS 0.2* 0.2* 2.9 6.4 2.5  HGB 10.0* 9.5* 9.2* 8.9* 9.8*  HCT 32.9* 29.4* 28.6* 27.2* 30.0*  MCV 104.1* 97.7 97.9 96.5 96.5  PLT 43* 35* 33* 33*  42*   BMP &GFR Recent Labs  Lab 06/14/22 0748 06/15/22 0337 06/16/22 0450 06/17/22 0302 06/18/22 0824  NA 140 139 140 141 142  K 4.1 3.7 3.8 3.6 3.5  CL 111 110 114* 114* 115*  CO2 $Re'22 23 22 24 22  'hYj$ GLUCOSE 93 128* 106* 93 96  BUN 35* 33* 24* 18 14  CREATININE 1.67* 1.40* 1.23 1.09 1.24  CALCIUM 8.2* 8.1* 7.9* 8.1* 8.2*  MG 1.8 1.9 1.7 1.8 1.8  PHOS  --  3.0  --  2.1* 2.2*   Estimated Creatinine Clearance: 52.3 mL/min (by C-G formula based on SCr of 1.24 mg/dL). Liver & Pancreas: Recent Labs  Lab 06/13/22 2100 06/14/22 0748 06/15/22 0337 06/16/22 0450 06/17/22 0302 06/18/22 0824  AST 85* 69* 70* 53* 62*  --   ALT 46* 36 37 32 39  --   ALKPHOS 157* 123 129* 112 115  --   BILITOT 0.5 0.6 0.5 0.6 0.5  --   PROT 6.1* 4.9* 4.9* 4.5* 4.6*  --   ALBUMIN 3.3* 2.7* 2.7* 2.4* 2.5* 2.6*   Recent Labs  Lab 06/13/22 2100  LIPASE 69*   Recent Labs  Lab 06/13/22 2100  AMMONIA 14   Diabetic: No results for input(s): "HGBA1C" in the last 72 hours. Recent Labs  Lab 06/13/22 1949 06/14/22 2226 06/15/22 0200  GLUCAP 106* 86 133*   Cardiac Enzymes: Recent Labs  Lab 06/14/22 0846 06/18/22 0824  CKTOTAL 124 29*   No results for input(s): "PROBNP" in the last 8760 hours. Coagulation Profile: Recent Labs  Lab 06/14/22 0531 06/15/22 0337 06/16/22 0450 06/17/22 0302 06/18/22 0824   INR 4.2* 3.7* 2.0* 1.8* 2.7*   Thyroid Function Tests: No results for input(s): "TSH", "T4TOTAL", "FREET4", "T3FREE", "THYROIDAB" in the last 72 hours.  Lipid Profile: Recent Labs    06/17/22 0302  TRIG 195*   Anemia Panel: No results for input(s): "VITAMINB12", "FOLATE", "FERRITIN", "TIBC", "IRON", "RETICCTPCT" in the last 72 hours.  Urine analysis:    Component Value Date/Time   COLORURINE YELLOW 06/13/2022 2300   APPEARANCEUR HAZY (A) 06/13/2022 2300   LABSPEC 1.017 06/13/2022 2300   PHURINE 5.0 06/13/2022 2300   GLUCOSEU NEGATIVE 06/13/2022 2300   GLUCOSEU NEGATIVE 08/11/2017 0843   HGBUR MODERATE (A) 06/13/2022 2300   HGBUR negative 03/20/2010 0843   BILIRUBINUR NEGATIVE 06/13/2022 2300   BILIRUBINUR n 03/22/2011 0000   KETONESUR NEGATIVE 06/13/2022 2300   PROTEINUR 100 (A) 06/13/2022 2300   UROBILINOGEN 0.2 08/11/2017 0843   NITRITE NEGATIVE 06/13/2022 2300   LEUKOCYTESUR NEGATIVE 06/13/2022 2300   Sepsis Labs: Invalid input(s): "PROCALCITONIN", "LACTICIDVEN"  Microbiology: Recent Results (from the past 240 hour(s))  SARS Coronavirus 2 by RT PCR (hospital order, performed in [facility] hospital lab) *cepheid single result test* Anterior Nasal Swab     Status: None   Collection Time: 06/13/22  8:26 PM   Specimen: Anterior Nasal Swab  Result Value Ref Range Status   SARS Coronavirus 2 by RT PCR NEGATIVE NEGATIVE Final    Comment: (NOTE) SARS-CoV-2 target nucleic acids are NOT DETECTED.  The SARS-CoV-2 RNA is generally detectable in upper and lower respiratory specimens during the acute phase of infection. The lowest concentration of SARS-CoV-2 viral copies this assay can detect is 250 copies / mL. A negative result does not preclude SARS-CoV-2 infection and should not be used as the sole basis for treatment or other patient management decisions.  A negative result may occur with improper specimen collection / handling,  submission of specimen other than  nasopharyngeal swab, presence of viral mutation(s) within the areas targeted by this assay, and inadequate number of viral copies (<250 copies / mL). A negative result must be combined with clinical observations, patient history, and epidemiological information.  Fact Sheet for Patients:   https://www.patel.info/  Fact Sheet for Healthcare Providers: https://hall.com/  This test is not yet approved or  cleared by the [place] FDA and has been authorized for detection and/or diagnosis of SARS-CoV-2 by FDA under an Emergency Use Authorization (EUA).  This EUA will remain in effect (meaning this test can be used) for the duration of the COVID-19 declaration under Section 564(b)(1) of the Act, 21 U.S.C. section 360bbb-3(b)(1), unless the authorization is terminated or revoked sooner.  Performed at [facility], [place] [address] [place], [place] [address]   Culture, blood (Routine X 2) w Reflex to ID Panel     Status: None (Preliminary result)   Collection Time: 06/14/22  8:36 AM   Specimen: BLOOD RIGHT HAND  Result Value Ref Range Status   Specimen Description   Final    BLOOD RIGHT HAND Performed at [name] [address] [place], [place] [address]    Special Requests   Final    IN PEDIATRIC BOTTLE Blood Culture adequate volume Performed at [facility] [address], [place], [place] [address]    Culture   Final    NO GROWTH 4 DAYS Performed at [facility], [place] [address] [place], [place] [address]    Report Status PENDING  Incomplete  Culture, blood (Routine X 2) w Reflex to ID Panel     Status: None (Preliminary result)   Collection Time: 06/14/22  8:46 AM   Specimen: BLOOD  Result Value Ref Range Status   Specimen Description   Final    BLOOD BLOOD LEFT FOREARM Performed at [facility] [address] [place], [place] [address]     Special Requests   Final    BOTTLES DRAWN AEROBIC ONLY Blood Culture results may not be optimal due to an inadequate volume of blood received in culture bottles Performed at [facility] [address], [place], [place] [address]    Culture   Final    NO GROWTH 4 DAYS Performed at [facility], [place] [address] [place],  [address]    Report Status PENDING  Incomplete    Radiology Studies: EEG adult  Result Date: 2022/06/29 Lora Havens, MD     June 29, 2022  8:29 AM Patient Name: CHEVEZ SAMBRANO MRN: [id number] Epilepsy Attending: Lora Havens Referring Physician/Provider: Mercy Riding, MD Date: 06/17/2022 Duration: 21.06 mins Patient history: 76 year old male with altered mental status.  EEG to evaluate for seizure. Level of alertness: Awake AEDs during EEG study: None Technical aspects: This EEG study was done with scalp electrodes positioned according to the 10-20 International system of electrode placement. Electrical activity was reviewed with band pass filter of 1-$RemoveBef'70Hz'vnyWTHHRoG$ , sensitivity of 7 uV/mm, display speed of 8mm/sec with a $Remo'60Hz'zkqRw$  notched filter applied as appropriate. EEG data were recorded continuously and digitally stored.  Video monitoring was available and reviewed as appropriate. Description: The posterior dominant rhythm consists of 6 Hz activity of moderate voltage (25-35 uV) seen predominantly in posterior head regions, symmetric and reactive to eye opening and eye closing. EEG showed continuous generalized 5 to 6 Hz theta slowing. Hyperventilation and photic stimulation were not performed.  ABNORMALITY - Continuous slow, generalized IMPRESSION: This study is suggestive of moderate diffuse encephalopathy, nonspecific etiology. No seizures or epileptiform discharges were seen throughout the recording. Priyanka Barbra Sarks      Levan Aloia T. [place]  If 7PM-7AM, please contact night-coverage [email or web address] 06/18/2022, 1:42 PM

## 2022-06-19 ENCOUNTER — Inpatient Hospital Stay: Payer: No Typology Code available for payment source | Admitting: Hematology & Oncology

## 2022-06-19 ENCOUNTER — Inpatient Hospital Stay: Payer: No Typology Code available for payment source

## 2022-06-19 DIAGNOSIS — D72819 Decreased white blood cell count, unspecified: Secondary | ICD-10-CM

## 2022-06-19 DIAGNOSIS — D469 Myelodysplastic syndrome, unspecified: Secondary | ICD-10-CM | POA: Diagnosis not present

## 2022-06-19 DIAGNOSIS — G3184 Mild cognitive impairment, so stated: Secondary | ICD-10-CM

## 2022-06-19 DIAGNOSIS — D696 Thrombocytopenia, unspecified: Secondary | ICD-10-CM | POA: Diagnosis not present

## 2022-06-19 DIAGNOSIS — C8300 Small cell B-cell lymphoma, unspecified site: Secondary | ICD-10-CM

## 2022-06-19 DIAGNOSIS — R4182 Altered mental status, unspecified: Secondary | ICD-10-CM | POA: Diagnosis not present

## 2022-06-19 LAB — CBC WITH DIFFERENTIAL/PLATELET
Abs Immature Granulocytes: 0.01 10*3/uL (ref 0.00–0.07)
Basophils Absolute: 0 10*3/uL (ref 0.0–0.1)
Basophils Relative: 0 %
Eosinophils Absolute: 0 10*3/uL (ref 0.0–0.5)
Eosinophils Relative: 0 %
HCT: 31.6 % — ABNORMAL LOW (ref 39.0–52.0)
Hemoglobin: 10.5 g/dL — ABNORMAL LOW (ref 13.0–17.0)
Immature Granulocytes: 0 %
Lymphocytes Relative: 64 %
Lymphs Abs: 1.9 10*3/uL (ref 0.7–4.0)
MCH: 31.4 pg (ref 26.0–34.0)
MCHC: 33.2 g/dL (ref 30.0–36.0)
MCV: 94.6 fL (ref 80.0–100.0)
Monocytes Absolute: 0.2 10*3/uL (ref 0.1–1.0)
Monocytes Relative: 6 %
Neutro Abs: 0.9 10*3/uL — ABNORMAL LOW (ref 1.7–7.7)
Neutrophils Relative %: 30 %
Platelets: 60 10*3/uL — ABNORMAL LOW (ref 150–400)
RBC: 3.34 MIL/uL — ABNORMAL LOW (ref 4.22–5.81)
RDW: 14 % (ref 11.5–15.5)
WBC: 3 10*3/uL — ABNORMAL LOW (ref 4.0–10.5)
nRBC: 0 % (ref 0.0–0.2)

## 2022-06-19 LAB — URINALYSIS, COMPLETE (UACMP) WITH MICROSCOPIC
Bacteria, UA: NONE SEEN
Bilirubin Urine: NEGATIVE
Glucose, UA: NEGATIVE mg/dL
Hgb urine dipstick: NEGATIVE
Ketones, ur: NEGATIVE mg/dL
Leukocytes,Ua: NEGATIVE
Nitrite: NEGATIVE
Protein, ur: 30 mg/dL — AB
Specific Gravity, Urine: 1.014 (ref 1.005–1.030)
pH: 7 (ref 5.0–8.0)

## 2022-06-19 LAB — COMPREHENSIVE METABOLIC PANEL
ALT: 72 U/L — ABNORMAL HIGH (ref 0–44)
AST: 90 U/L — ABNORMAL HIGH (ref 15–41)
Albumin: 3 g/dL — ABNORMAL LOW (ref 3.5–5.0)
Alkaline Phosphatase: 132 U/L — ABNORMAL HIGH (ref 38–126)
Anion gap: 5 (ref 5–15)
BUN: 11 mg/dL (ref 8–23)
CO2: 23 mmol/L (ref 22–32)
Calcium: 8.5 mg/dL — ABNORMAL LOW (ref 8.9–10.3)
Chloride: 114 mmol/L — ABNORMAL HIGH (ref 98–111)
Creatinine, Ser: 1.04 mg/dL (ref 0.61–1.24)
GFR, Estimated: >60 mL/min (ref >60)
Glucose, Bld: 99 mg/dL (ref 70–99)
Potassium: 3.3 mmol/L — ABNORMAL LOW (ref 3.5–5.1)
Sodium: 142 mmol/L (ref 135–145)
Total Bilirubin: 0.6 mg/dL (ref 0.3–1.2)
Total Protein: 5.7 g/dL — ABNORMAL LOW (ref 6.5–8.1)

## 2022-06-19 LAB — CULTURE, BLOOD (ROUTINE X 2)
Culture: NO GROWTH
Culture: NO GROWTH
Special Requests: ADEQUATE

## 2022-06-19 LAB — PROTIME-INR
INR: 2.9 — ABNORMAL HIGH (ref 0.8–1.2)
Prothrombin Time: 30 seconds — ABNORMAL HIGH (ref 11.4–15.2)

## 2022-06-19 LAB — EHRLICHIA ANTIBODY PANEL
E chaffeensis (HGE) Ab, IgG: NEGATIVE
E chaffeensis (HGE) Ab, IgM: NEGATIVE
E. Chaffeensis (HME) IgM Titer: NEGATIVE
E.Chaffeensis (HME) IgG: NEGATIVE

## 2022-06-19 LAB — ACTH: C206 ACTH: 20.6 pg/mL (ref 7.2–63.3)

## 2022-06-19 LAB — ACTH STIMULATION, 3 TIME POINTS
Cortisol, 30 Min: 13.9 ug/dL
Cortisol, 60 Min: 18 ug/dL
Cortisol, Base: 6 ug/dL

## 2022-06-19 LAB — MAGNESIUM: Magnesium: 1.7 mg/dL (ref 1.7–2.4)

## 2022-06-19 MED ORDER — POTASSIUM CHLORIDE 20 MEQ PO PACK
40.0000 meq | PACK | Freq: Once | ORAL | Status: AC
  Administered 2022-06-19: 40 meq via ORAL
  Filled 2022-06-19: qty 2

## 2022-06-19 MED ORDER — ROMIPLOSTIM 125 MCG ~~LOC~~ SOLR
1.0000 ug/kg | Freq: Once | SUBCUTANEOUS | Status: AC
  Administered 2022-06-19: 80 ug via SUBCUTANEOUS
  Filled 2022-06-19: qty 0.16

## 2022-06-19 MED ORDER — WARFARIN SODIUM 5 MG PO TABS
5.0000 mg | ORAL_TABLET | Freq: Once | ORAL | Status: AC
Start: 2022-06-19 — End: 2022-06-19
  Administered 2022-06-19: 5 mg via ORAL
  Filled 2022-06-19: qty 1

## 2022-06-19 NOTE — Progress Notes (Signed)
Overall, Mr. Uselman might be a little bit better.  He still is having some cognitive difficulties.  He Tried to turn on his cell phone even though it was dead.  He was not sure how to use his iPad.  He did have an EEG done.  Looks like there is some issues with respect to the EEG.  I am unsure exactly what these mean with respect to diffuse encephalopathy.  Again, I would think that he would need a LP but I still think this is going to be done because he has low platelets and because he is on Coumadin.  At least, his bone marrow came back better than I would have thought.  There is no obvious CLL.  It is a very minor portion of the bone marrow.  Looks that there may be an element of myelodysplasia.  There is concern explained the low white cells in his platelets.  He does have increased megakaryocytes.  Again, this might be reflective of myelodysplasia.  I will try him on a dose of Nplate to see if this may help.  Today, his white count is 5.2.  Hemoglobin 9.8.  Platelet count 42,000.  The BUN is 14 creatinine 1.24.  His calcium is 8.2 with an albumin of 2.6.  His triglycerides only 195.  His iron saturation was 44%.  I still think that it would be tough for him to go home.  I would think that he is  I think that he needs someone to be with him 24 hours a day.  I still think he might be a little unsteady.  He says he is eating better.  He is on Megace.  He says he is urinating quite a bit.  We will will check another urinalysis on him.  Hopefully he can give [name] 1.  His vital signs showed temperature 98.5.  Pulse 79.  Blood pressure 108/57.  His lungs are clear bilaterally.  Cardiac exam regular rate and rhythm.  He has 1/6 systolic ejection murmur.  Abdomen is soft.  He has good bowel sounds.  There is no fluid wave.  There is no palpable liver or spleen tip.  Extremities show some symmetric weakness.  He has some tremors.  Skin exam shows no rashes.  He has no ecchymoses.  There is a ecchymoses on  the right forearm.  Again, the bone marrow looks better than I thought.  I am happy about this.  I just hate the fact that he still has this cognitive impairment.  Again not sure as to what this is attributed to.  He had a EEG which showed some encephalopathic changes.  Again I am not sure why he would have this.  I will try Nplate and see if this may help with his platelets little bit.   Lattie Haw, MD  Psalm 62:1

## 2022-06-19 NOTE — Progress Notes (Signed)
[facility] for Warfarin Indication: h/o recurrent PE, PAFib  Allergies  Allergen Reactions   Hydrocodone-Acetaminophen Shortness Of Breath   Oxycodone Hcl Shortness Of Breath   Telmisartan-Hctz Shortness Of Breath, Palpitations and Other (See Comments)    Dizziness also   Aspirin Rash and Other (See Comments)    Confusion, also- Cannot take high doses   Atorvastatin Other (See Comments)    Myalgia    Buspirone Hcl Other (See Comments)    Headaches    Codeine Hives, Nausea And Vomiting and Other (See Comments)    Confusion also   Morphine Sulfate Itching   Paroxetine Other (See Comments)    Paresthesias: R arm, R leg, r side of face   Rabeprazole Other (See Comments)    headache   Ramipril Other (See Comments)    Unknown reaction    Patient Measurements: Height: '5\' 10"'$  (177.8 cm) Weight: 81.7 kg (180 lb 1.9 oz) IBW/kg (Calculated) : 73  Vital Signs:    Labs: Recent Labs    06/17/22 0302 06/18/22 0824 06/19/22 0742  HGB 8.9* 9.8* 10.5*  HCT 27.2* 30.0* 31.6*  PLT 33* 42* 60*  LABPROT 21.0* 28.4* 30.0*  INR 1.8* 2.7* 2.9*  CREATININE 1.09 1.24 1.04  CKTOTAL  --  29*  --      Estimated Creatinine Clearance: 62.4 mL/min (by C-G formula based on SCr of 1.04 mg/dL).  Medications:  PTA Warfarin  Assessment: 76 yr male with CLL undergoing chemotherapy.  PMH significant for  CKD, recurent PE, PAF Med history documented patient states takes warfarin occasionally and is unsure of how was supposed to be taking this medication. Stated he took Warfarin '15mg'$  on Fri and Sat, then '10mg'$  on Mon, Wed and Thur Per Onc note 7/7, dose listed '15mg'$  Sun/Thu, '10mg'$  all other days  Today, 06/19/2022 INR remains therapeutic & slightly higher after large jump and warfarin held yesterday CBC: hgb low but improving; Plt low at baseline d/t MM, but improving; now > 50k (Nplate given this AM) No bleeding reported per RN Possible drug interaction with  metronidazole (stopped 8/7; got 4 days worth - can increase warfarin sensitivity)  Goal of Therapy:  INR goal 2.5-3.0 per Onc note 7/7   Plan:  Warfarin 5 mg today Daily PT/INR  Reuel Boom, PharmD, BCPS [phone] 06/19/2022, 10:42 AM

## 2022-06-19 NOTE — Progress Notes (Signed)
PROGRESS NOTE    [name]  IWO:[id number] DOB: Jun 28, 1946 DOA: 06/13/2022  PCP: [name], MD  Brief Narrative:  This 76 year old M with PMH significant of prostate cancer s/p prostatectomy, CLL, bioprosthetic AVR, recurrent PE on Coumadin, PAF and CKD-3B brought to ED after he was found down on his knees and hands by his friend.  Patient denied fall but fatigue.  No signs of serious injury. Patient was started on broad-spectrum antibiotics and admitted for altered mental status, AKI and neutropenia.  TTE ordered. UA with moderate Hgb.  CT head and CXR without acute finding.  EKG sinus rhythm with PACs.  Oncology consulted.    In regards to pancytopenia/neutropenia.  He underwent bone marrow biopsy on 8/5.  Cultures negative.  He was given Granix, and neutropenia/leukopenia resolved.  Thrombocytopenia worse.  Antibiotics discontinued on 8/7.  Oncology following.   Patient had RUQ [name] as part of evaluation for elevated LFT that showed 8 mm right hepatic lobe lesion.  AFP and CEA negative. MRI liver was motion limited and showed innumerable small T2 hyperintense lesions throughout the liver suggesting regenerating nodules but no focal lesion or abnormal enhancement.  Follow-up contrast-enhanced CT was recommended.     Workup including MRI brain unrevealing.  It looks like he has cognitive decline.  He had seizure-like activity while getting an MRI.  EEG ordered. Neurology consulted but symptoms felt to be from orthostatic hypotension versus seizure.  Orthostatic vitals were positive.    TTE showed LVEF of 50%, GH, G2-DD, RVSP of 32.4, moderate LAE, severe bioprosthetic aortic valve stenosis.  Cardiology consulted and recommended follow-up with his cardiologist.    His a.m. cortisol was low.  Ordered ACTH stimulation test.  Bone marrow biopsy pending as well.   Therapy recommended SNF.    Assessment & Plan:   Principal Problem:   Altered mental status Active Problems:    Neutropenia ([place])   AKI (acute kidney injury) ([place])   Chronic lymphocytic leukemia (HCC)   S/P Bioprosthetic AVR (aortic valve replacement) in 2011   History of pulmonary embolism   Pancytopenia (HCC)   Transaminitis   Liver lesion, right lobe   AMS (altered mental status)   Severe aortic stenosis   Chronic diastolic CHF (congestive heart failure) (HCC)   Seizure-like activity (HCC)   Orthostatic hypotension   Altered mental status: > Improved. Unclear how long this has been going on.  Looks like cognitive decline.   Per patient's aunt, his cognition seems to be the same as when she saw him 5 months ago.  Has no focal neurodeficit.   He is oriented x4 but limited insight and recalls.  CT head and MRI without acute finding.   He has no focal neuro symptoms.  No signs of meningism.  Ammonia, B12 and TSH unrevealing.  He has history of CLL.  Follow Lyme/tickborne disease serologies but no report of tick bite. Reorientation and delirium precautions.   Seizure-like activity-Reportedly had shaking and urinary incontinence while getting MRI. thought to be from orthostatic hypotension versus seizure.  EEG suggests moderate diffuse encephalopathy but no seizure or epileptiform discharge. Neurology signed off.   Severe aortic valve stenosis with Bioprosthetic AVR-  Noted on TTE.  Per aunt, DOE and "tremors" with exertion.   He might be having DOE and presyncope.   However, cardiology felt his TTE finding are stable compared to his TTE in August 2022.   Patient is followed by Dr. [name] outpatient. -Appreciate input by cardiology -No further  inpatient evaluation but outpatient follow-up for valve in valve TAVR consideration   Orthostatic hypotension: Probably contributing to his tremors with activity and exertion and DOE.  Orthostatic vitals positive.  TTE as above.  TSH within normal.  This could be due to aortic stenosis and a/or autonomic dysfunction.  His a.m. cortisol is  low. -Appreciate help by cardiology-compression garments, abdominal binder and midodrine -Elevate head of bed to at least 30 degree -Follow ACTH level. -Reordered ACTH stimulation test for 8/9 -Fall precaution -Therapy recommended SNF.   AKI: Likely prerenal.  Initially thought to have CKD.  CKD ruled out. Lab Results  Component Value Date   CREATININE 1.04 06/19/2022   CREATININE 1.24 06/18/2022   CREATININE 1.09 06/17/2022      Neutropenia/pancytopenia/CLL: Reportedly has not had chemotherapy in 2 months.  Started on Granix on 8/5.   Leukopenia/neutropenia resolved.  Anemia stable.  Thrombocytopenia improved.  Blood cultures negative.  Antibiotic discontinued. -Continue home acyclovir -Follow bone marrow biopsy result -Oncology following.   Liver lesion/mild transaminitis: LFT improved.  Colonoscopy in 2014 with mild diverticulosis, and no mentions of polyps.  MRI liver was motion limited and showed innumerable small T2 hyperintense lesions throughout the liver suggesting regenerating nodules but no focal lesion or abnormal enhancement.  AFP and CEA negative. -Follow-up contrast-enhanced CT was recommended-likely outpatient. -Continue monitoring   History of pulmonary embolism/supratherapeutic INR: INR 2.7. -Warfarin per pharmacy   Paroxysmal A-fib: Currently in sinus rhythm.  Not on rate or rhythm control -Warfarin per pharmacy   Chronic diastolic CHF: TTE with LVEF of 50%, GH, G2-DD and severe aortic stenosis.   Appears euvolemic on exam.  Not on diuretics at home.  Denies dyspnea or chest pain. -Monitor fluid status   Generalized weakness/fatigue: Independently ambulates at baseline. -PT/OT-recommended SNF.   Hypophosphatemia: Replenish and recheck.   Decreased oral intake Body mass index is 25.84 kg/m. Nutrition Problem: Inadequate oral intake Etiology: decreased appetite Signs/Symptoms: per patient/family report Interventions: Ensure Enlive (each supplement  provides 350kcal and 20 grams of protein), MVI   DVT prophylaxis: Coumadin Code Status: Full code. Family Communication: Niece at bedside Disposition Plan:   Status is: Inpatient Remains inpatient appropriate because: Orthostatic hypotension and altered mental status.    Anticipated discharge to [facility] 06/20/2022  Consultants:  Oncology Neurology Cardiology  Procedures: CT head, MRI Antimicrobials:  Anti-infectives (From admission, onward)    Start     Dose/Rate Route Frequency Ordered Stop   06/15/22 1200  vancomycin (VANCOREADY) IVPB 1250 mg/250 mL  Status:  Discontinued        1,250 mg 166.7 mL/hr over 90 Minutes Intravenous Every 36 hours 06/14/22 0228 06/14/22 0959   06/14/22 2200  vancomycin (VANCOCIN) IVPB 1000 mg/200 mL premix  Status:  Discontinued        1,000 mg 200 mL/hr over 60 Minutes Intravenous Every 24 hours 06/14/22 0959 06/17/22 0650   06/14/22 1500  ceFEPIme (MAXIPIME) 2 g in sodium chloride 0.9 % 100 mL IVPB  Status:  Discontinued        2 g 200 mL/hr over 30 Minutes Intravenous Every 12 hours 06/14/22 0220 06/17/22 0650   06/14/22 1000  acyclovir (ZOVIRAX) tablet 400 mg        400 mg Oral Daily 06/14/22 0346     06/14/22 0230  metroNIDAZOLE (FLAGYL) IVPB 500 mg  Status:  Discontinued        500 mg 100 mL/hr over 60 Minutes Intravenous Every 12 hours 06/14/22 0206 06/17/22 0650  06/13/22 2300  vancomycin (VANCOREADY) IVPB 1500 mg/300 mL        1,500 mg 150 mL/hr over 120 Minutes Intravenous  Once 06/13/22 2255 06/14/22 0124   06/13/22 2300  ceFEPIme (MAXIPIME) 2 g in sodium chloride 0.9 % 100 mL IVPB        2 g 200 mL/hr over 30 Minutes Intravenous  Once 06/13/22 2255 06/15/22 0011        Subjective: Patient was seen and examined at bedside.  Overnight events noted.   Patient appears improved reports he feels better and denies any symptoms.  Objective: Vitals:   06/18/22 1331 06/18/22 1342 06/18/22 1828 06/19/22 1243  BP: 106/61 (!) 64/46 (!)  108/57 129/88  Pulse: (!) 50 83 79 76  Resp: $Remo'18  18 18  'fdbbt$ Temp: 97.6 F (36.4 C)  98.5 F (36.9 C) 97.6 F (36.4 C)  TempSrc: Oral  Oral Oral  SpO2: 98% 93% 100% 98%  Weight:      Height:        Intake/Output Summary (Last 24 hours) at 06/19/2022 1611 Last data filed at 06/19/2022 0900 Gross per 24 hour  Intake 620 ml  Output 850 ml  Net -230 ml   Filed Weights   06/14/22 0400 06/15/22 0500 06/17/22 0500  Weight: 77.1 kg 77.6 kg 81.7 kg    Examination:  General exam: Appears comfortable, not in any acute distress. Respiratory system: CTA bilaterally, no wheezing, no crackles, normal respiratory effort. Cardiovascular system: S1 & S2 heard, regular rate and rhythm, no murmur. Gastrointestinal system: Abdomen is soft, nontender, nondistended, BS+ Central nervous system: Alert and oriented x 2. No focal neurological deficits. Extremities: No edema, no cyanosis, no clubbing. Skin: No rashes, lesions or ulcers Psychiatry: Judgement and insight appear normal. Mood & affect appropriate.     Data Reviewed: I have personally reviewed following labs and imaging studies  CBC: Recent Labs  Lab 06/15/22 0337 06/16/22 0450 06/17/22 0302 06/18/22 0824 06/19/22 0742  WBC 0.9* 4.9 7.0 5.2 3.0*  NEUTROABS 0.2* 2.9 6.4 2.5 0.9*  HGB 9.5* 9.2* 8.9* 9.8* 10.5*  HCT 29.4* 28.6* 27.2* 30.0* 31.6*  MCV 97.7 97.9 96.5 96.5 94.6  PLT 35* 33* 33* 42* 60*   Basic Metabolic Panel: Recent Labs  Lab 06/15/22 0337 06/16/22 0450 06/17/22 0302 06/18/22 0824 06/19/22 0742  NA 139 140 141 142 142  K 3.7 3.8 3.6 3.5 3.3*  CL 110 114* 114* 115* 114*  CO2 $Re'23 22 24 22 23  'awD$ GLUCOSE 128* 106* 93 96 99  BUN 33* 24* $Remov'18 14 11  'tLqZrY$ CREATININE 1.40* 1.23 1.09 1.24 1.04  CALCIUM 8.1* 7.9* 8.1* 8.2* 8.5*  MG 1.9 1.7 1.8 1.8 1.7  PHOS 3.0  --  2.1* 2.2*  --    GFR: Estimated Creatinine Clearance: 62.4 mL/min (by C-G formula based on SCr of 1.04 mg/dL). Liver Function Tests: Recent Labs  Lab  06/14/22 0748 06/15/22 0337 06/16/22 0450 06/17/22 0302 06/18/22 0824 06/19/22 0742  AST 69* 70* 53* 62*  --  90*  ALT 36 37 32 39  --  72*  ALKPHOS 123 129* 112 115  --  132*  BILITOT 0.6 0.5 0.6 0.5  --  0.6  PROT 4.9* 4.9* 4.5* 4.6*  --  5.7*  ALBUMIN 2.7* 2.7* 2.4* 2.5* 2.6* 3.0*   Recent Labs  Lab 06/13/22 2100  LIPASE 69*   Recent Labs  Lab 06/13/22 2100  AMMONIA 14   Coagulation Profile: Recent Labs  Lab 06/15/22 [phone]  06/16/22 0450 06/17/22 0302 06/18/22 0824 06/19/22 0742  INR 3.7* 2.0* 1.8* 2.7* 2.9*   Cardiac Enzymes: Recent Labs  Lab 06/14/22 0846 06/18/22 0824  CKTOTAL 124 29*   BNP (last 3 results) No results for input(s): "PROBNP" in the last 8760 hours. HbA1C: No results for input(s): "HGBA1C" in the last 72 hours. CBG: Recent Labs  Lab 06/13/22 1949 06/14/22 2226 06/15/22 0200  GLUCAP 106* 86 133*   Lipid Profile: Recent Labs    06/17/22 0302  TRIG 195*   Thyroid Function Tests: No results for input(s): "TSH", "T4TOTAL", "FREET4", "T3FREE", "THYROIDAB" in the last 72 hours. Anemia Panel: No results for input(s): "VITAMINB12", "FOLATE", "FERRITIN", "TIBC", "IRON", "RETICCTPCT" in the last 72 hours. Sepsis Labs: Recent Labs  Lab 06/13/22 2100  LATICACIDVEN 1.0    Recent Results (from the past 240 hour(s))  SARS Coronavirus 2 by RT PCR (hospital order, performed in [facility] hospital lab) *cepheid single result test* Anterior Nasal Swab     Status: None   Collection Time: 06/13/22  8:26 PM   Specimen: Anterior Nasal Swab  Result Value Ref Range Status   SARS Coronavirus 2 by RT PCR NEGATIVE NEGATIVE Final    Comment: (NOTE) SARS-CoV-2 target nucleic acids are NOT DETECTED.  The SARS-CoV-2 RNA is generally detectable in upper and lower respiratory specimens during the acute phase of infection. The lowest concentration of SARS-CoV-2 viral copies this assay can detect is 250 copies / mL. A negative result does not preclude  SARS-CoV-2 infection and should not be used as the sole basis for treatment or other patient management decisions.  A negative result may occur with improper specimen collection / handling, submission of specimen other than nasopharyngeal swab, presence of viral mutation(s) within the areas targeted by this assay, and inadequate number of viral copies (<250 copies / mL). A negative result must be combined with clinical observations, patient history, and epidemiological information.  Fact Sheet for Patients:   https://www.patel.info/  Fact Sheet for Healthcare Providers: https://hall.com/  This test is not yet approved or  cleared by the [place] FDA and has been authorized for detection and/or diagnosis of SARS-CoV-2 by FDA under an Emergency Use Authorization (EUA).  This EUA will remain in effect (meaning this test can be used) for the duration of the COVID-19 declaration under Section 564(b)(1) of the Act, 21 U.S.C. section 360bbb-3(b)(1), unless the authorization is terminated or revoked sooner.  Performed at [facility], [place] [address] [place], [place] [address]   Culture, blood (Routine X 2) w Reflex to ID Panel     Status: None   Collection Time: 06/14/22  8:36 AM   Specimen: BLOOD RIGHT HAND  Result Value Ref Range Status   Specimen Description   Final    BLOOD RIGHT HAND Performed at [place] [address] [place], [place] [address]    Special Requests   Final    IN PEDIATRIC BOTTLE Blood Culture adequate volume Performed at [place] [address] [place], [place] [address]    Culture   Final    NO GROWTH 5 DAYS Performed at [facility], [place] [address] [place], [place] [address]    Report Status 06/19/2022 FINAL  Final  Culture, blood (Routine X 2) w Reflex to ID Panel     Status: None   Collection Time: 06/14/22  8:46 AM   Specimen:  BLOOD  Result Value Ref Range Status   Specimen  Description   Final    BLOOD BLOOD LEFT FOREARM Performed at [place] [address] [place], [place] [address]    Special Requests   Final    BOTTLES DRAWN AEROBIC ONLY Blood Culture results may not be optimal due to an inadequate volume of blood received in culture bottles Performed at [facility] [address], [place], [place] [address]    Culture   Final    NO GROWTH 5 DAYS Performed at [facility], [place] [address], [place], [place] [address]    Report Status 06/19/2022 FINAL  Final    Radiology Studies: EEG adult  Result Date: 2022/07/03 Lora Havens, MD     07/03/22  8:29 AM Patient Name: JCEON ALVERIO MRN: [id number] Epilepsy Attending: Lora Havens Referring Physician/Provider: Mercy Riding, MD Date: 06/17/2022 Duration: 21.06 mins Patient history: 76 year old male with altered mental status.  EEG to evaluate for seizure. Level of alertness: Awake AEDs during EEG study: None Technical aspects: This EEG study was done with scalp electrodes positioned according to the 10-20 International system of electrode placement. Electrical activity was reviewed with band pass filter of 1-$RemoveBef'70Hz'zJMIvvGcjf$ , sensitivity of 7 uV/mm, display speed of 67mm/sec with a $Remo'60Hz'mrCIR$  notched filter applied as appropriate. EEG data were recorded continuously and digitally stored.  Video monitoring was available and reviewed as appropriate. Description: The posterior dominant rhythm consists of 6 Hz activity of moderate voltage (25-35 uV) seen predominantly in posterior head regions, symmetric and reactive to eye opening and eye closing. EEG showed continuous generalized 5 to 6 Hz theta slowing. Hyperventilation and photic stimulation were not performed.   ABNORMALITY - Continuous slow, generalized IMPRESSION: This study is suggestive of moderate diffuse encephalopathy, nonspecific etiology. No seizures or epileptiform  discharges were seen throughout the recording. Priyanka Barbra Sarks    Scheduled Meds:  acyclovir  400 mg Oral Daily   feeding supplement  237 mL Oral TID BM   megestrol  400 mg Oral BID   midodrine  2.5 mg Oral TID WC   multivitamin with minerals  1 tablet Oral Daily   pantoprazole  40 mg Oral Daily   rosuvastatin  5 mg Oral Daily   sertraline  100 mg Oral q morning   warfarin  5 mg Oral ONCE-1600   Warfarin - Pharmacist Dosing Inpatient   Does not apply q1600   Continuous Infusions:   LOS: 5 days    Time spent: 35 mins    Shanikqua Zarzycki, MD [facility]   If 7PM-7AM, please contact night-coverage

## 2022-06-19 NOTE — Progress Notes (Signed)
Occupational Therapy Treatment Patient Details Name: [name] MRN: [id number] DOB: 03/15/1946 Today's Date: 06/19/2022   History of present illness 76 yo male presents to ED with AMS, weakness x3 days. +  leukopenia and thrombocytopenia. Pt receives chemotherapy for CLL, last chemo >2 months ago. s/p CT guided bone marrow biopsy on 8/4. seizure like activity 8/4. PMH includes AVR, CKDIII, PE, afib, HLD, HTN.   OT comments  Patient able to don pants with min guard. Patient stood twice but needed seated break after each stand as patient reporting exhaustion. Able to get patient to sit in recliner instead of bed bed. Educated on need to stay out of bed and ambulate more in order to regain strength. Patient reports performing a lot of Tai Chi the past year and therapist recommended he do some seated poses. He verbalized understanding. Cognition appears to be improved today. Continue to recommend short term rehab. BP 100/75 in standing with no overt symptoms of hypotension. Will continue to follow.   Recommendations for follow up therapy are one component of a multi-disciplinary discharge planning process, led by the attending physician.  Recommendations may be updated based on patient status, additional functional criteria and insurance authorization.    Follow Up Recommendations  Skilled nursing-short term rehab (<3 hours/day)    Assistance Recommended at Discharge Frequent or constant Supervision/Assistance  Patient can return home with the following  A lot of help with walking and/or transfers;A lot of help with bathing/dressing/bathroom;Assistance with cooking/housework;Direct supervision/assist for financial management;Assist for transportation;Help with stairs or ramp for entrance;Direct supervision/assist for medications management   Equipment Recommendations  None recommended by OT    Recommendations for Other Services      Precautions / Restrictions Precautions Precautions:  Fall Precaution Comments: orthostatic hypotension - compression garments, abdominal binder donned before mobility and RN reports pt received midodrine Restrictions Weight Bearing Restrictions: No       Mobility Bed Mobility                    Transfers                         Balance Overall balance assessment: Mild deficits observed, not formally tested                                         ADL either performed or assessed with clinical judgement   ADL Overall ADL's : Needs assistance/impaired                     Lower Body Dressing: Supervision/safety;Min guard;Sit to/from stand Lower Body Dressing Details (indicate cue type and reason): able to don pants, min guard when standing             Functional mobility during ADLs: Min guard General ADL Comments: Patient supervision for supine to sit. MIn guard to stand. Wanted to sit down after pulling pants up and reporting exhaustion. Stood again for BP which was 100/75. Able to stand and take steps to recliner holding on to bed rail. Once again reporting exhaustion. Educated on need to stay out of bed and workon on strengthening. patient reports doing a lot of tai chi and therapist recommend patient do seated poses.    Extremity/Trunk Assessment Upper Extremity Assessment Upper Extremity Assessment: Overall WFL for tasks assessed   Lower Extremity Assessment Lower   Extremity Assessment: Defer to PT evaluation   Cervical / Trunk Assessment Cervical / Trunk Assessment: Normal    Vision Patient Visual Report: No change from baseline     Perception     Praxis      Cognition Arousal/Alertness: Awake/alert Behavior During Therapy: WFL for tasks assessed/performed Overall Cognitive Status: Within Functional Limits for tasks assessed                                 General Comments: alert to self, birthdate, place        Exercises      Shoulder  Instructions       General Comments      Pertinent Vitals/ Pain       Pain Assessment Pain Assessment: No/denies pain  Home Living                                          Prior Functioning/Environment              Frequency  Min 2X/week        Progress Toward Goals  OT Goals(current goals can now be found in the care plan section)  Progress towards OT goals: Progressing toward goals  Acute Rehab OT Goals Patient Stated Goal: regain strength and endurance OT Goal Formulation: With patient/family Time For Goal Achievement: 06/30/22 Potential to Achieve Goals: [place] Discharge plan remains appropriate    Co-evaluation                 AM-PAC OT "6 Clicks" Daily Activity     Outcome Measure   Help from another person eating meals?: A Little Help from another person taking care of personal grooming?: A Little Help from another person toileting, which includes using toliet, bedpan, or urinal?: A Lot Help from another person bathing (including washing, rinsing, drying)?: A Little Help from another person to put on and taking off regular upper body clothing?: A Little Help from another person to put on and taking off regular lower body clothing?: A Little 6 Click Score: 17    End of Session Equipment Utilized During Treatment: Gait belt  OT Visit Diagnosis: Unsteadiness on feet (R26.81);Other abnormalities of gait and mobility (R26.89);Muscle weakness (generalized) (M62.81)   Activity Tolerance Patient tolerated treatment well   Patient Left in chair;with call bell/phone within reach;with chair alarm set;with family/visitor present   Nurse Communication Mobility status        Time: 2542-7062 OT Time Calculation (min): 22 min  Charges: OT General Charges $OT Visit: 1 Visit OT Treatments $Self Care/Home Management : 8-22 mins  Derl Barrow, OTR/L [place]  Office [phone] Pager: [place] 06/19/2022, 11:29 AM

## 2022-06-19 NOTE — Plan of Care (Signed)
  Problem: Education: Goal: Knowledge of General Education information will improve Description Including pain rating scale, medication(s)/side effects and non-pharmacologic comfort measures Outcome: Progressing   

## 2022-06-19 NOTE — Progress Notes (Signed)
Writer was told by lab not to give Cosyntropin until 7 am. Day nurse made aware.

## 2022-06-19 NOTE — TOC Progression Note (Addendum)
Transition of Care [facility]) - Progression Note    Patient Details  Name: SAHAN PEN MRN: [id number] Date of Birth: Dec 21, 1945  [facility] of [facility]) CM/SW Contact  Killian Ress, Juliann Pulse, RN Phone Number: 06/19/2022, 10:02 AM  Clinical Narrative:Have auth awaiting if medically stable for d/c to [facility].MD notified. -11:24a-Noted not medically stable today.auth until 8/10 for [place] aware.    Expected Discharge Plan: [place] Barriers to Discharge: Continued Medical Work up  Expected Discharge Plan and Services Expected Discharge Plan: [place] Choice: [place] arrangements for the past 2 months: Single Family Home                                       Social Determinants of Health (SDOH) Interventions    Readmission Risk Interventions    06/16/2022    5:17 PM  Readmission Risk Prevention Plan  Transportation Screening Complete  PCP or Specialist Appt within 3-5 Days Complete  HRI or [place] Complete  Social Work Consult for [place] Planning/Counseling Complete  Palliative Care Screening Not Applicable  Medication Review [employer]) Referral to Pharmacy

## 2022-06-19 NOTE — Progress Notes (Signed)
Physical Therapy Treatment Patient Details Name: [name] MRN: [id number] DOB: [birth year] Today's Date: 06/19/2022   History of Present Illness 76 yo male presents to ED with AMS, weakness x3 days. +  leukopenia and thrombocytopenia. Pt receives chemotherapy for CLL, last chemo >2 months ago. s/p CT guided bone marrow biopsy on 8/4. seizure like activity 8/4. PMH includes AVR, CKDIII, PE, afib, HLD, HTN.    PT Comments    Pt tolerated increased ambulation distance of 110' with RW, he denied dizziness throughout. He is progressing well with mobility.    Recommendations for follow up therapy are one component of a multi-disciplinary discharge planning process, led by the attending physician.  Recommendations may be updated based on patient status, additional functional criteria and insurance authorization.  Follow Up Recommendations  Skilled nursing-short term rehab (<3 hours/day)     Assistance Recommended at Discharge Intermittent Supervision/Assistance  Patient can return home with the following A little help with walking and/or transfers;A little help with bathing/dressing/bathroom;Assistance with cooking/housework;Direct supervision/assist for medications management;Help with stairs or ramp for entrance;Direct supervision/assist for financial management   Equipment Recommendations  Rollator (4 wheels)    Recommendations for Other Services       Precautions / Restrictions Precautions Precautions: Fall Precaution Comments: orthostatic hypotension - was wearing compression garments, abdominal binder donned before mobility, pt received midodrine prior to session. (8/9 pt ambulated without compression hose/abdominal binder and reported no dizziness) Restrictions Weight Bearing Restrictions: No     Mobility  Bed Mobility Overal bed mobility: Modified Independent Bed Mobility: Supine to Sit     Supine to sit: Modified independent (Device/Increase time)     General bed  mobility comments: pt utilized rails and elevated HOB    Transfers Overall transfer level: Needs assistance Equipment used: Rolling walker (2 wheels) Transfers: Sit to/from Stand             General transfer comment: VCs for hand placement    Ambulation/Gait Ambulation/Gait assistance: Supervision Gait Distance (Feet): 110 Feet Assistive device: Rolling walker (2 wheels) Gait Pattern/deviations: Step-through pattern, Decreased stride length Gait velocity: decr     General Gait Details: steady, no loss of balance, pt denied dizziness while walking, pt reported feeling mild anxiety while walking, encouraged slow deep breathing   Stairs             Wheelchair Mobility    Modified Rankin (Stroke Patients Only)       Balance Overall balance assessment: Modified Independent Sitting-balance support: Feet supported, No upper extremity supported Sitting balance-Leahy Scale: Good     Standing balance support: Reliant on assistive device for balance Standing balance-Leahy Scale: Fair                              Cognition Arousal/Alertness: Awake/alert Behavior During Therapy: WFL for tasks assessed/performed Overall Cognitive Status: Within Functional Limits for tasks assessed                                 General Comments: alert to self, birthdate, place, follows commands well        Exercises      General Comments        Pertinent Vitals/Pain Pain Assessment Pain Assessment: No/denies pain    Home Living  Prior Function            PT Goals (current goals can now be found in the care plan section) Acute Rehab PT Goals Patient Stated Goal: tai chi PT Goal Formulation: With patient/family Time For Goal Achievement: 06/29/22 Potential to Achieve Goals: Good Progress towards PT goals: Progressing toward goals    Frequency    Min 3X/week      PT Plan Current plan remains  appropriate    Co-evaluation              AM-PAC PT "6 Clicks" Mobility   Outcome Measure  Help needed turning from your back to your side while in a flat bed without using bedrails?: None Help needed moving from lying on your back to sitting on the side of a flat bed without using bedrails?: A Little Help needed moving to and from a bed to a chair (including a wheelchair)?: A Little Help needed standing up from a chair using your arms (e.g., wheelchair or bedside chair)?: A Little Help needed to walk in hospital room?: A Little Help needed climbing 3-5 steps with a railing? : A Little 6 Click Score: 19    End of Session Equipment Utilized During Treatment: Gait belt Activity Tolerance: Patient tolerated treatment well Patient left: with call bell/phone within reach;with chair alarm set;in chair;with family/visitor present Nurse Communication: Mobility status PT Visit Diagnosis: Other abnormalities of gait and mobility (R26.89);Muscle weakness (generalized) (M62.81)     Time: 9794-8016 PT Time Calculation (min) (ACUTE ONLY): 19 min  Charges:  $Gait Training: 8-22 mins                     [name] PT 06/19/2022  Acute Rehabilitation Services  Office [phone]

## 2022-06-20 ENCOUNTER — Telehealth: Payer: Self-pay | Admitting: *Deleted

## 2022-06-20 ENCOUNTER — Encounter (HOSPITAL_COMMUNITY): Payer: Self-pay | Admitting: Hematology & Oncology

## 2022-06-20 DIAGNOSIS — D696 Thrombocytopenia, unspecified: Secondary | ICD-10-CM | POA: Diagnosis not present

## 2022-06-20 DIAGNOSIS — D469 Myelodysplastic syndrome, unspecified: Secondary | ICD-10-CM | POA: Diagnosis not present

## 2022-06-20 DIAGNOSIS — G3184 Mild cognitive impairment, so stated: Secondary | ICD-10-CM | POA: Diagnosis not present

## 2022-06-20 DIAGNOSIS — D72819 Decreased white blood cell count, unspecified: Secondary | ICD-10-CM | POA: Diagnosis not present

## 2022-06-20 DIAGNOSIS — R4182 Altered mental status, unspecified: Secondary | ICD-10-CM | POA: Diagnosis not present

## 2022-06-20 LAB — BASIC METABOLIC PANEL
Anion gap: 5 (ref 5–15)
BUN: 12 mg/dL (ref 8–23)
CO2: 22 mmol/L (ref 22–32)
Calcium: 8.6 mg/dL — ABNORMAL LOW (ref 8.9–10.3)
Chloride: 114 mmol/L — ABNORMAL HIGH (ref 98–111)
Creatinine, Ser: 1.05 mg/dL (ref 0.61–1.24)
GFR, Estimated: >60 mL/min (ref >60)
Glucose, Bld: 104 mg/dL — ABNORMAL HIGH (ref 70–99)
Potassium: 3.9 mmol/L (ref 3.5–5.1)
Sodium: 141 mmol/L (ref 135–145)

## 2022-06-20 LAB — CBC
HCT: 30 % — ABNORMAL LOW (ref 39.0–52.0)
Hemoglobin: 9.6 g/dL — ABNORMAL LOW (ref 13.0–17.0)
MCH: 30.9 pg (ref 26.0–34.0)
MCHC: 32 g/dL (ref 30.0–36.0)
MCV: 96.5 fL (ref 80.0–100.0)
Platelets: 62 10*3/uL — ABNORMAL LOW (ref 150–400)
RBC: 3.11 MIL/uL — ABNORMAL LOW (ref 4.22–5.81)
RDW: 14.1 % (ref 11.5–15.5)
WBC: 1.9 10*3/uL — ABNORMAL LOW (ref 4.0–10.5)
nRBC: 0 % (ref 0.0–0.2)

## 2022-06-20 LAB — MAGNESIUM: Magnesium: 1.9 mg/dL (ref 1.7–2.4)

## 2022-06-20 LAB — PHOSPHORUS: Phosphorus: 2.7 mg/dL (ref 2.5–4.6)

## 2022-06-20 LAB — PROTIME-INR
INR: 3.1 — ABNORMAL HIGH (ref 0.8–1.2)
Prothrombin Time: 31.4 seconds — ABNORMAL HIGH (ref 11.4–15.2)

## 2022-06-20 MED ORDER — MEGESTROL ACETATE 400 MG/10ML PO SUSP: 400.0000 mg | Freq: Two times a day (BID) | ORAL | 0 refills | Status: AC

## 2022-06-20 MED ORDER — MIDODRINE HCL 2.5 MG PO TABS: 2.5000 mg | ORAL_TABLET | Freq: Three times a day (TID) | ORAL | 0 refills | Status: AC

## 2022-06-20 MED ORDER — LORAZEPAM 0.5 MG PO TABS: 0.5000 mg | ORAL_TABLET | Freq: Every evening | ORAL | 0 refills | Status: AC | PRN

## 2022-06-20 MED ORDER — WARFARIN SODIUM 5 MG PO TABS: 5.0000 mg | ORAL_TABLET | Freq: Once | ORAL | Status: DC

## 2022-06-20 MED ORDER — TBO-FILGRASTIM 480 MCG/0.8ML ~~LOC~~ SOSY
480.0000 ug | PREFILLED_SYRINGE | Freq: Once | SUBCUTANEOUS | Status: AC
  Administered 2022-06-20: 480 ug via SUBCUTANEOUS
  Filled 2022-06-20: qty 0.8

## 2022-06-20 NOTE — Plan of Care (Signed)
  Problem: Education: Goal: Knowledge of General Education information will improve Description Including pain rating scale, medication(s)/side effects and non-pharmacologic comfort measures Outcome: Progressing   Problem: Health Behavior/Discharge Planning: Goal: Ability to manage health-related needs will improve Outcome: Progressing   

## 2022-06-20 NOTE — Discharge Instructions (Signed)
Advised to follow-up with primary care physician in 1 week. Patient is being discharged to [facility] skilled nursing facility for rehabilitation. Advised to take midodrine 2.5 mg 3 times daily for hypotension.

## 2022-06-20 NOTE — TOC Transition Note (Addendum)
Transition of Care [facility]) - CM/SW Discharge Note   Patient Details  Name: [name] MRN: [id number] Date of Birth: December 05, 1945  [facility] CM/SW Contact:  Dessa Phi, RN Phone Number: 06/20/2022, 9:29 AM   Clinical Narrative: d/c today [name]. Spoke to [name](niece) agree. [place] aware. D/c summary sent,going to rm#[id number],tel# report [phone].PTAR called. No further CM needs. -11:56a-MD aware of ativan hard script order 1/2 tab(preferred) remove choice of 1/2 or 1/4 tab.    Final next level of care: [employer] Barriers to Discharge: No Barriers Identified   Patient Goals and CMS Choice Patient states their goals for this hospitalization and ongoing recovery are:: To go to SNF for rehab, then return back home. CMS Medicare.gov Compare Post Acute Care list provided to:: Patient Represenative (must comment) Choice offered to / list presented to : [place] / [place]  Discharge Placement PASRR number recieved: 06/17/22            Patient chooses bed at: [place] Patient to be transferred to facility by: [name] [name]) Name of family member notified:  ([name](niece)) Patient and family notified of of transfer: 06/20/22  Discharge Plan and Services   Discharge Planning Services: CM Consult Post Acute Care Choice: [place]                               Social Determinants of Health (SDOH) Interventions     Readmission Risk Interventions    06/16/2022    5:17 PM  Readmission Risk Prevention Plan  Transportation Screening Complete  PCP or Specialist Appt within 3-5 Days Complete  HRI or [place] Complete  Social Work Consult for [place] Planning/Counseling Complete  Palliative Care Screening Not Applicable  Medication Review [employer]) Referral to Pharmacy

## 2022-06-20 NOTE — Telephone Encounter (Signed)
Message received from patient's niece, Rod Holler wanting to know next steps for pt per [name].  [name] notified.  Call placed back to Rod Holler and Rod Holler notified per order of [name] that he would like for pt to come in next week for lab work, to see a provider, possible injection and that a scheduler will be calling her to schedule these appts.  Rod Holler is appreciative of call back and has no further questions at this time.

## 2022-06-20 NOTE — Progress Notes (Signed)
Mobility Specialist - Progress Note    06/20/22 1117  Mobility  Activity Ambulated with assistance in hallway;Ambulated with assistance to bathroom  Level of Assistance Minimal assist, patient does 75% or more  Assistive Device Front wheel walker  Distance Ambulated (ft) 8 ft  Activity Response Tolerated fair  $Mobility charge 1 Mobility   Pt agreeable to mobilize this morning, required Min A to stand from bed. Ambulated ~17f, then requested to sit. C/o dizziness, but stated it went away after ~1 minute of resting. Rolled pt back to room, then he requested to use bathroom. Pt reported needing to sit when washing hands. Rolled pt back with recliner and was then left in recliner with chair alarm on all needs in reach.   AStanardsvilleSpecialist Acute Rehabilitation Services Phone: [phone]/10/23, 11:19 AM

## 2022-06-20 NOTE — Discharge Summary (Signed)
Physician Discharge Summary  Manfred Laspina Raske LOV:[id number] DOB: 21-Dec-1945 DOA: 06/13/2022  PCP: [name], MD  Admit date: 06/13/2022  Discharge date: 06/20/2022  Admitted From: Home.  Disposition: [facility] nursing facility  Recommendations for Outpatient Follow-up:  Follow up with PCP in 1-2 weeks. Please obtain BMP/CBC in one week. Patient is being discharged to [facility] skilled nursing facility for rehabilitation. Advised to take midodrine 2.5 mg 3 times daily for hypotension. Follow-up oncology for Neupogen infusion for leukopenia. 7.   Advised to follow-up with cardiology for evaluation for TAVR  Home Health: None Equipment/Devices: None Discharge Condition: Stable CODE STATUS:Full code Diet recommendation: Heart Healthy   Brief [facility] Course: This 76 year old M with PMH significant of prostate cancer s/p prostatectomy, CLL, bioprosthetic AVR, recurrent PE on Coumadin, PAF and CKD-3B brought to ED after he was found down on his knees and hands by his friend.  Patient denied fall but reports fatigue.  No signs of serious injury. Patient was started on broad-spectrum antibiotics and admitted for altered mental status, AKI and neutropenia.  TTE ordered. UA with moderate Hgb.  CT head and CXR without acute finding.  EKG sinus rhythm with PACs.  Oncology was consulted for neutropenia, In regards to pancytopenia/neutropenia.  He underwent bone marrow biopsy on 8/5.  Cultures negative.  He was given Granix, and neutropenia/leukopenia resolved.  Thrombocytopenia worse.  Antibiotics discontinued on 8/7.  Oncology recommended Neupogen infusion once a week.  Follow-up outpatient. Patient had RUQ [name] as part of evaluation for elevated LFT that showed 8 mm right hepatic lobe lesion.  AFP and CEA negative. MRI liver was motion limited and showed innumerable small T2 hyperintense lesions throughout the liver suggesting regenerating nodules but no focal lesion or  abnormal enhancement.  Follow-up contrast-enhanced CT was recommended.   Workup including MRI brain unrevealing.  It looks like he has cognitive decline.  He had seizure-like activity while getting an MRI.  EEG ordered. Neurology consulted but symptoms felt to be from orthostatic hypotension versus seizure.  Orthostatic vitals were positive.  EEG no evidence of seizures. TTE showed LVEF of 50%, GH, G2-DD, RVSP of 32.4, moderate LAE, severe bioprosthetic aortic valve stenosis.  Cardiology consulted and recommended follow-up with his cardiologist.  Bone marrow biopsy results are pending.  Patient cleared from oncology to be discharged to SNF for rehab.  Patient is being discharged to SNF.   He was managed for below problems during hospitalization.  Discharge Diagnoses:  Principal Problem:   Altered mental status Active Problems:   Neutropenia ([place])   AKI (acute kidney injury) ([place])   Chronic lymphocytic leukemia (HCC)   S/P Bioprosthetic AVR (aortic valve replacement) in 2011   History of pulmonary embolism   Pancytopenia (HCC)   Transaminitis   Liver lesion, right lobe   AMS (altered mental status)   Severe aortic stenosis   Chronic diastolic CHF (congestive heart failure) (HCC)   Seizure-like activity (HCC)   Orthostatic hypotension  Altered mental status: > Improved. Unclear how long this has been going on.  Looks like progressive cognitive decline.   Per patient's aunt, his cognition seems to be the same as when she saw him 76 months ago.  Has no focal neurodeficit.   He is oriented x4 but limited insight and recalls.  CT head and MRI without acute finding.   He has no focal neuro symptoms.  No signs of meningism.  Ammonia, B12 and TSH unrevealing.  He has history of CLL.  Follow Lyme/tickborne disease serologies but no report of tick bite. Reorientation and delirium precautions.   Seizure-like activity-Reportedly had shaking and urinary incontinence while getting MRI.  It was  thought to be from orthostatic hypotension versus seizure.   EEG suggests moderate diffuse encephalopathy but no seizure or epileptiform discharge. Neurology signed off.   Severe aortic valve stenosis with Bioprosthetic AVR-  Noted on TTE.  Per aunt, DOE and "tremors" with exertion.   He might be having DOE and presyncope.   However, cardiology felt his TTE finding are stable compared to his TTE in August 2022.   Patient is followed by Dr. [name] outpatient. -Appreciate input by cardiology -No further inpatient evaluation but outpatient follow-up for valve in valve TAVR consideration   Orthostatic hypotension: Probably contributing to his tremors with activity and exertion and DOE.  Orthostatic vitals positive.  TTE as above.  TSH within normal.  This could be due to aortic stenosis and a/or autonomic dysfunction.  His a.m. cortisol is low. -Appreciate help by cardiology-compression garments, abdominal binder and midodrine -Elevate head of bed to at least 30 degree -Follow ACTH level. -Reordered ACTH stimulation test for 8/9 -Fall precaution -Therapy recommended SNF. -Continue midodrine 2.5 mg 3 times daily for hypotension.   AKI: Likely prerenal.  Initially thought to have CKD.  CKD ruled out.  Recent Labs       Lab Results  Component Value Date    CREATININE 1.04 06/19/2022    CREATININE 1.24 06/18/2022    CREATININE 1.09 06/17/2022        Neutropenia/pancytopenia/CLL: Reportedly has not had chemotherapy in 2 months.  Started on Granix on 8/5.   Leukopenia/neutropenia resolved.  Anemia stable.  Thrombocytopenia improved.  Blood cultures negative.  Antibiotic discontinued. -Continue home acyclovir -Follow bone marrow biopsy result -Oncology following.   Liver lesion/mild transaminitis: LFT improved.  Colonoscopy in 2014 with mild diverticulosis, and no mentions of polyps.  MRI liver was motion limited and showed innumerable small T2 hyperintense lesions throughout the liver  suggesting regenerating nodules but no focal lesion or abnormal enhancement.  AFP and CEA negative. -Follow-up contrast-enhanced CT was recommended-likely outpatient. -Continue monitoring   History of pulmonary embolism/supratherapeutic INR: INR 2.7. -Warfarin per pharmacy   Paroxysmal A-fib: Currently in sinus rhythm.  Not on rate or rhythm control -Warfarin per pharmacy   Chronic diastolic CHF: TTE with LVEF of 50%, GH, G2-DD and severe aortic stenosis.   Appears euvolemic on exam.  Not on diuretics at home.  Denies dyspnea or chest pain. -Monitor fluid status   Generalized weakness/fatigue: Independently ambulates at baseline. -PT/OT-recommended SNF.   Hypophosphatemia: Replaced and resolved.   Decreased oral intake Body mass index is 25.84 kg/m. Nutrition Problem: Inadequate oral intake Etiology: decreased appetite Signs/Symptoms: per patient/family report Interventions: Ensure Enlive (each supplement provides 350kcal and 20 grams of protein), MVI  Discharge Instructions  Discharge Instructions     Call MD for:  difficulty breathing, headache or visual disturbances   Complete by: As directed    Call MD for:  persistant dizziness or light-headedness   Complete by: As directed    Call MD for:  persistant nausea and vomiting   Complete by: As directed    Diet - low sodium heart healthy   Complete by: As directed    Diet Carb Modified   Complete by: As directed    Discharge instructions   Complete by: As directed    Advised to follow-up with primary care physician in 1 week. Patient  is being discharged to [facility] skilled nursing facility for rehabilitation. Advised to take midodrine 2.5 mg 3 times daily for hypotension.   Discharge wound care:   Complete by: As directed    Follow-up wound care at the skilled nursing facility.   Increase activity slowly   Complete by: As directed       Allergies as of 06/20/2022       Reactions   Hydrocodone-acetaminophen  Shortness Of Breath   Oxycodone Hcl Shortness Of Breath   Telmisartan-hctz Shortness Of Breath, Palpitations, Other (See Comments)   Dizziness also   Aspirin Rash, Other (See Comments)   Confusion, also- Cannot take high doses   Atorvastatin Other (See Comments)   Myalgia   Buspirone Hcl Other (See Comments)   Headaches   Codeine Hives, Nausea And Vomiting, Other (See Comments)   Confusion also   Morphine Sulfate Itching   Paroxetine Other (See Comments)   Paresthesias: R arm, R leg, r side of face   Rabeprazole Other (See Comments)   headache   Ramipril Other (See Comments)   Unknown reaction        Medication List     STOP taking these medications    cefdinir 300 MG capsule Commonly known as: OMNICEF       TAKE these medications    acyclovir 400 MG tablet Commonly known as: ZOVIRAX TAKE 1 TABLET BY MOUTH EVERY DAY   [employer] Sickness Relief 25 MG Chew Generic drug: Meclizine HCl CHEW 1 TABLET EVERY DAY AS NEEDED FOR DIZZINESS   docusate sodium 100 MG capsule Commonly known as: COLACE Take 100 mg by mouth daily.   ipratropium 0.03 % nasal spray Commonly known as: ATROVENT Place 2 sprays into both nostrils every 12 (twelve) hours. What changed:  when to take this reasons to take this   LORazepam 0.5 MG tablet Commonly known as: ATIVAN Take 1 tablet (0.5 mg total) by mouth at bedtime as needed for anxiety (can take 1/4 or 1/2 tablet  if effective.).   megestrol 400 MG/10ML suspension Commonly known as: MEGACE Take 10 mLs (400 mg total) by mouth 2 (two) times daily.   midodrine 2.5 MG tablet Commonly known as: PROAMATINE Take 1 tablet (2.5 mg total) by mouth 3 (three) times daily with meals.   omeprazole 40 MG capsule Commonly known as: PRILOSEC Take 40 mg by mouth every evening.   ondansetron 8 MG tablet Commonly known as: Zofran Take 1 tablet (8 mg total) by mouth 2 (two) times daily as needed for refractory nausea / vomiting. Start on day 2  after bendamustine chemo.   prochlorperazine 10 MG tablet Commonly known as: COMPAZINE Take 1 tablet (10 mg total) by mouth every 6 (six) hours as needed (Nausea or vomiting).   rosuvastatin 5 MG tablet Commonly known as: CRESTOR Take 5 mg by mouth daily.   sertraline 100 MG tablet Commonly known as: ZOLOFT Take 100 mg by mouth every morning.   sodium chloride 0.65 % Soln nasal spray Commonly known as: OCEAN Place 1 spray into both nostrils as needed for congestion.   Vitamin D3 50 MCG (2000 UT) capsule Take 2,000 Units by mouth every morning.   warfarin 5 MG tablet Commonly known as: COUMADIN Take as directed. If you are unsure how to take this medication, talk to your nurse or doctor. Original instructions: TAKE AS DIRECTED BY COUMADIN CLINIC               Discharge Care Instructions  (  From admission, onward)           Start     Ordered   06/20/22 0000  Discharge wound care:       Comments: Follow-up wound care at the skilled nursing facility.   06/20/22 1059            Contact information for follow-up providers     Josue Hector, MD Follow up on 07/08/2022.   Specialty: Cardiology Why: at 1400 with his Nurse Practitioner Ambrose Pancoast. Contact information: [address] [address] [phone]         [name], MD Follow up in 1 week(s).   Specialty: Family Medicine Contact information: [address] [place] [address] [phone]         Josue Hector, MD .   Specialty: Cardiology Contact information: [phone] [address] [phone]              Contact information for after-discharge care     [facility] .   Service: Skilled Nursing Contact information: [place] [address] [phone]                    Allergies  Allergen Reactions    Hydrocodone-Acetaminophen Shortness Of Breath   Oxycodone Hcl Shortness Of Breath   Telmisartan-Hctz Shortness Of Breath, Palpitations and Other (See Comments)    Dizziness also   Aspirin Rash and Other (See Comments)    Confusion, also- Cannot take high doses   Atorvastatin Other (See Comments)    Myalgia    Buspirone Hcl Other (See Comments)    Headaches    Codeine Hives, Nausea And Vomiting and Other (See Comments)    Confusion also   Morphine Sulfate Itching   Paroxetine Other (See Comments)    Paresthesias: R arm, R leg, r side of face   Rabeprazole Other (See Comments)    headache   Ramipril Other (See Comments)    Unknown reaction    Consultations: Neurology Cardiology Oncology   Procedures/Studies: EEG adult  Result Date: 06-27-22 Lora Havens, MD     2022/06/27  8:29 AM Patient Name: NAZIR HACKER MRN: [id number] Epilepsy Attending: Lora Havens Referring Physician/Provider: Mercy Riding, MD Date: 06/17/2022 Duration: 21.06 mins Patient history: 76 year old male with altered mental status.  EEG to evaluate for seizure. Level of alertness: Awake AEDs during EEG study: None Technical aspects: This EEG study was done with scalp electrodes positioned according to the 10-20 International system of electrode placement. Electrical activity was reviewed with band pass filter of 1-$RemoveBef'70Hz'vNGiumcmOO$ , sensitivity of 7 uV/mm, display speed of 15mm/sec with a $Remo'60Hz'xkgUh$  notched filter applied as appropriate. EEG data were recorded continuously and digitally stored.  Video monitoring was available and reviewed as appropriate. Description: The posterior dominant rhythm consists of 6 Hz activity of moderate voltage (25-35 uV) seen predominantly in posterior head regions, symmetric and reactive to eye opening and eye closing. EEG showed continuous generalized 5 to 6 Hz theta slowing. Hyperventilation and photic stimulation were not performed.   ABNORMALITY - Continuous slow, generalized IMPRESSION: This  study is suggestive of moderate diffuse encephalopathy, nonspecific etiology. No seizures or epileptiform discharges were seen throughout the recording. Lora Havens   MR LIVER W WO CONTRAST  Result Date: 06/15/2022 CLINICAL DATA:  Liver lesion. History of chronic lymphocytic leukemia.  EXAM: MRI ABDOMEN WITHOUT AND WITH CONTRAST TECHNIQUE: Multiplanar multisequence MR imaging of the abdomen was performed both before and after the administration of intravenous contrast. CONTRAST:  7.58mL GADAVIST GADOBUTROL 1 MMOL/ML IV SOLN COMPARISON:  Abdominal ultrasound 06/14/2022. Abdominopelvic CT 02/09/2022. FINDINGS: Despite efforts by the technologist and patient, moderate to severe motion artifact is present on today's exam and could not be eliminated. This reduces exam sensitivity and specificity. The motion becomes progressively worse throughout the examination, limiting the postcontrast images. Lower chest: Trace right pleural effusion. Status post aortic valve replacement. Hepatobiliary: Liver assessment is limited by the motion, especially on the postcontrast images. Numerous small T2 hyperintense nodules are present throughout the liver with mild restricted diffusion. These are not well seen on the limited postcontrast images. No concerning focal mass lesions are identified. No lesions were apparent in the liver on prior CT. No evidence of gallstones, gallbladder wall thickening or biliary dilatation. Pancreas: Unremarkable. No pancreatic ductal dilatation or surrounding inflammatory changes. Spleen: Stable mild splenomegaly without numerous small nonspecific nodules demonstrating T2 hypointensity. Adrenals/Urinary Tract: Both adrenal glands appear normal. Stable bilateral renal cysts which do not require imaging follow-up. No evidence of enhancing renal mass or hydronephrosis. Stomach/Bowel: The stomach appears unremarkable for its degree of distension. No evidence of bowel wall thickening, distention or  surrounding inflammatory change. Vascular/Lymphatic: Small residual retroperitoneal and mesenteric lymph nodes are not pathologically enlarged. Aortic atherosclerosis, better seen on CT. No evidence of aneurysm or large vessel occlusion. No significant vascular findings. Other: No evidence of abdominal wall hernia or ascites. Musculoskeletal: No acute or significant osseous findings. Susceptibility artifact in the back corresponding with small metallic foreign bodies on prior imaging. IMPRESSION: 1. Motion limited examination results in suboptimal evaluation of the liver. There are innumerable small T2 hyperintense lesions throughout the liver, but no focal lesions or abnormal enhancement identified on the postcontrast images. Findings may relate to regenerating nodules. Given the limitations of this examination, consider follow-up contrast enhanced CT. 2. Stable mild splenomegaly with scattered small nonspecific splenic nodules, likely benign. 3. No recurrent upper abdominal adenopathy. 4. No acute abdominal findings. Electronically Signed   By: Richardean Sale M.D.   On: 06/15/2022 09:23   MR BRAIN W WO CONTRAST  Result Date: 06/14/2022 CLINICAL DATA:  Altered mental status.  Possible brain metastases. EXAM: MRI HEAD WITHOUT AND WITH CONTRAST TECHNIQUE: Multiplanar, multiecho pulse sequences of the brain and surrounding structures were obtained without and with intravenous contrast. CONTRAST:  7.81mL GADAVIST GADOBUTROL 1 MMOL/ML IV SOLN COMPARISON:  09/02/2017 FINDINGS: Brain: No acute infarct, mass effect or extra-axial collection. No acute or chronic hemorrhage. There is multifocal hyperintense T2-weighted signal within the white matter. Generalized volume loss. There are old bilateral cerebellar small vessel infarcts. There is no abnormal contrast enhancement. Vascular: Major flow voids are preserved. Skull and upper cervical spine: Normal calvarium and skull base. Visualized upper cervical spine and soft  tissues are normal. Sinuses/Orbits:No paranasal sinus fluid levels or advanced mucosal thickening. No mastoid or middle ear effusion. Normal orbits. IMPRESSION: 1. No intracranial metastatic disease. 2. Chronic small vessel ischemia and volume loss. Electronically Signed   By: Ulyses Jarred M.D.   On: 06/14/2022 22:45   ECHOCARDIOGRAM COMPLETE  Result Date: 06/14/2022    ECHOCARDIOGRAM REPORT   Patient Name:   ELUZER HOWDESHELL Date of Exam: 06/14/2022 Medical Rec #:  [id number]      Height:       70.0 in Accession #:    [id number]  Weight:       170.0 lb Date of Birth:  1946-01-10      BSA:          1.948 m Patient Age:    4 years       BP:           93/58 mmHg Patient Gender: M              HR:           72 bpm. Exam Location:  Inpatient Procedure: 2D Echo, Color Doppler and Cardiac Doppler Indications:    R06.9 DOE  History:        Patient has prior history of Echocardiogram examinations, most                 recent 07/13/2021. Arrythmias:Atrial Fibrillation; Risk                 Factors:Hypertension and Dyslipidemia. Aortic Valve Replaced in                 2011.  Sonographer:    Raquel Sarna Senior RDCS Referring Phys: [place]  1. Left ventricular ejection fraction, by estimation, is 50%. The left ventricle has mildly decreased function. The left ventricle demonstrates global hypokinesis. There is mild left ventricular hypertrophy. Left ventricular diastolic parameters are consistent with Grade II diastolic dysfunction (pseudonormalization).  2. Right ventricular systolic function is normal. The right ventricular size is normal. There is normal pulmonary artery systolic pressure. The estimated right ventricular systolic pressure is 47.8 mmHg.  3. Left atrial size was moderately dilated.  4. Right atrial size was mildly dilated.  5. The mitral valve is normal in structure. Trivial mitral valve regurgitation. No evidence of mitral stenosis.  6. Bioprosthetic aortic valve present, the valve appears  thickened/calcified. Trivial aortic insufficiency. Mean gradient elevated at 47 mmHg suggesting severe stenosis. Dimensionless index 0.25, AVA 0.87 cm^2.  7. Aortic dilatation noted. There is mild dilatation of the aortic root, measuring 39 mm.  8. The inferior vena cava is normal in size with greater than 50% respiratory variability, suggesting right atrial pressure of 3 mmHg. FINDINGS  Left Ventricle: Left ventricular ejection fraction, by estimation, is 50%. The left ventricle has mildly decreased function. The left ventricle demonstrates global hypokinesis. The left ventricular internal cavity size was normal in size. There is mild left ventricular hypertrophy. Left ventricular diastolic parameters are consistent with Grade II diastolic dysfunction (pseudonormalization). Right Ventricle: The right ventricular size is normal. No increase in right ventricular wall thickness. Right ventricular systolic function is normal. There is normal pulmonary artery systolic pressure. The tricuspid regurgitant velocity is 2.71 m/s, and  with an assumed right atrial pressure of 3 mmHg, the estimated right ventricular systolic pressure is 29.5 mmHg. Left Atrium: Left atrial size was moderately dilated. Right Atrium: Right atrial size was mildly dilated. Pericardium: There is no evidence of pericardial effusion. Mitral Valve: The mitral valve is normal in structure. Trivial mitral valve regurgitation. No evidence of mitral valve stenosis. Tricuspid Valve: The tricuspid valve is normal in structure. Tricuspid valve regurgitation is mild. Aortic Valve: Bioprosthetic aortic valve present, the valve appears thickened/calcified. Trivial aortic insufficiency. Mean gradient elevated at 47 mmHg suggesting severe stenosis. Dimensionless index 0.25, AVA 0.87 cm^2. The aortic valve has been repaired/replaced. Aortic valve regurgitation is trivial. Aortic valve mean gradient measures 47.0 mmHg. Aortic valve peak gradient measures 71.6 mmHg.  Aortic valve area, by VTI measures 0.88 cm. Pulmonic Valve: The pulmonic  valve was normal in structure. Pulmonic valve regurgitation is trivial. Aorta: Aortic dilatation noted. There is mild dilatation of the aortic root, measuring 39 mm. Venous: The inferior vena cava is normal in size with greater than 50% respiratory variability, suggesting right atrial pressure of 3 mmHg. IAS/Shunts: No atrial level shunt detected by color flow Doppler.  LEFT VENTRICLE PLAX 2D LVIDd:         5.20 cm   Diastology LVIDs:         3.80 cm   LV e' medial:    5.00 cm/s LV PW:         1.10 cm   LV E/e' medial:  13.2 LV IVS:        1.10 cm   LV e' lateral:   9.79 cm/s LVOT diam:     2.10 cm   LV E/e' lateral: 6.7 LV SV:         86 LV SV Index:   44 LVOT Area:     3.46 cm  RIGHT VENTRICLE RV S prime:     11.60 cm/s TAPSE (M-mode): 1.5 cm LEFT ATRIUM              Index        RIGHT ATRIUM           Index LA diam:        3.80 cm  1.95 cm/m   RA Area:     18.10 cm LA Vol (A2C):   101.0 ml 51.85 ml/m  RA Volume:   46.50 ml  23.87 ml/m LA Vol (A4C):   101.0 ml 51.85 ml/m LA Biplane Vol: 103.0 ml 52.88 ml/m  AORTIC VALVE AV Area (Vmax):    0.86 cm AV Area (Vmean):   0.85 cm AV Area (VTI):     0.88 cm AV Vmax:           423.00 cm/s AV Vmean:          328.000 cm/s AV VTI:            0.978 m AV Peak Grad:      71.6 mmHg AV Mean Grad:      47.0 mmHg LVOT Vmax:         105.00 cm/s LVOT Vmean:        80.750 cm/s LVOT VTI:          0.250 m LVOT/AV VTI ratio: 0.26  AORTA Ao Root diam: 3.90 cm Ao Asc diam:  3.60 cm MITRAL VALVE               TRICUSPID VALVE MV Area (PHT): 2.87 cm    TR Peak grad:   29.4 mmHg MV Decel Time: 264 msec    TR Vmax:        271.00 cm/s MV E velocity: 66.00 cm/s MV A velocity: 41.10 cm/s  SHUNTS MV E/A ratio:  1.61        Systemic VTI:  0.25 m                            Systemic Diam: 2.10 cm Dalton McleanMD Electronically signed by Franki Monte Signature Date/Time: 06/14/2022/3:32:16 PM    Final    CT BONE  MARROW BIOPSY & ASPIRATION  Result Date: 06/14/2022 INDICATION: 76 year old with history of CLL.  Leukopenia and thrombocytopenia. EXAM: CT GUIDED BONE MARROW ASPIRATES AND BIOPSY Physician: Stephan Minister. Anselm Pancoast, MD MEDICATIONS: 1% lidocaine: Local anesthetic ANESTHESIA/SEDATION: None COMPLICATIONS:  None immediate. PROCEDURE: The procedure was explained to the patient. The risks and benefits of the procedure were discussed and the patient's questions were addressed. Informed consent was obtained from the patient. The patient was placed prone on CT table. Images of the pelvis were obtained. The right side of back was prepped and draped in sterile fashion. The skin and right posterior ilium were anesthetized with 1% lidocaine. 11 gauge bone needle was directed into the right ilium with CT guidance. Two aspirates and one core biopsy were obtained. Bandage placed over the puncture site. RADIATION DOSE REDUCTION: This exam was performed according to the departmental dose-optimization program which includes automated exposure control, adjustment of the mA and/or kV according to patient size and/or use of iterative reconstruction technique. FINDINGS: Biopsy needle was directed into the posterior right ilium. IMPRESSION: CT guided bone marrow aspiration and core biopsy. Electronically Signed   By: Markus Daft M.D.   On: 06/14/2022 13:35   US Abdomen Complete  Result Date: 06/14/2022 CLINICAL DATA:  History of chronic lymphocytic leukemia. EXAM: ABDOMEN ULTRASOUND COMPLETE COMPARISON:  CT scan of February 09, 2022. FINDINGS: Gallbladder: No gallstones or wall thickening visualized. No sonographic Murphy sign noted by sonographer. Common bile duct: Diameter: 4 mm which is within normal limits. Liver: 8 mm rounded hypoechoic abnormality is noted in right hepatic lobe which is not diagnostic for a cyst based on ultrasound criteria. Within normal limits in parenchymal echogenicity. Portal vein is patent on color Doppler imaging with  normal direction of blood flow towards the liver. IVC: No abnormality visualized. Pancreas: Visualized portion unremarkable. Spleen: Measures 14.9 x 12.2 x 6.8 cm with calculated volume of 644 cc, consistent with mild splenomegaly. No focal abnormality is noted. Right Kidney: Length: 12.8 cm. Two cysts are noted, the largest measuring 2.9 cm. Echogenicity within normal limits. No mass or hydronephrosis visualized. Left Kidney: Length: 12.1 cm. 2.3 cm simple cyst is seen in lower pole. Echogenicity within normal limits. No mass or hydronephrosis visualized. Abdominal aorta: No aneurysm visualized. Other findings: None. IMPRESSION: 8 mm rounded hypoechoic abnormality noted in right hepatic lobe which is not diagnostic for cyst based on ultrasound criteria. Neoplasm or metastatic disease cannot be excluded. When the patient is clinically stable and able to follow directions and hold their breath (preferably as an outpatient) further evaluation with dedicated abdominal MRI should be considered. Mild splenomegaly is noted. Electronically Signed   By: Marijo Conception M.D.   On: 06/14/2022 08:43   DG Chest 2 View  Result Date: 06/13/2022 CLINICAL DATA:  Altered mental status and weakness for several days, initial encounter EXAM: CHEST - 2 VIEW COMPARISON:  05/31/2022 FINDINGS: Cardiac shadow is within limits. Postsurgical changes are again seen. Aortic calcifications are noted. Lungs are clear. No bony abnormality is noted. IMPRESSION: No active cardiopulmonary disease. Electronically Signed   By: Inez Catalina M.D.   On: 06/13/2022 20:48   CT HEAD WO CONTRAST (5MM)  Result Date: 06/13/2022 CLINICAL DATA:  Dizziness EXAM: CT HEAD WITHOUT CONTRAST TECHNIQUE: Contiguous axial images were obtained from the base of the skull through the vertex without intravenous contrast. RADIATION DOSE REDUCTION: This exam was performed according to the departmental dose-optimization program which includes automated exposure control,  adjustment of the mA and/or kV according to patient size and/or use of iterative reconstruction technique. COMPARISON:  11/07/2021 FINDINGS: Brain: No acute intracranial findings are seen. There are no signs of bleeding within the cranium. Cortical sulci are prominent. There is decreased density in  periventricular and subcortical white matter. Calcifications are seen in basal ganglia on both sides. Vascular: Scattered arterial calcifications are seen. Skull: No fracture is seen. Sinuses/Orbits: There are no air-fluid levels in paranasal sinuses. There is partial opacification of left side of sphenoid sinus. There is minimal mucosal thickening in ethmoid and maxillary sinuses. Other: No significant interval changes are noted. IMPRESSION: No acute intracranial findings are seen in noncontrast CT brain. Atrophy. Small-vessel disease. Chronic sinusitis. Electronically Signed   By: Elmer Picker M.D.   On: 06/13/2022 20:13   DG Chest 2 View  Result Date: 05/31/2022 CLINICAL DATA:  Provided history: CLL-neutropenia and fever. EXAM: CHEST - 2 VIEW COMPARISON:  CT chest/abdomen/pelvis 02/09/2022. Chest radiographs 09/20/2021 and earlier. FINDINGS: Prior median sternotomy and aortic valve replacement. Heart size within normal limits. Aortic atherosclerosis. Redemonstrated linear foci of scarring within the perihilar right lung and right lung base. No appreciable airspace consolidation or pulmonary edema. No evidence of pleural effusion or pneumothorax. No acute bony abnormality identified. Degenerative changes of the spine. Redemonstrated small metallic foreign bodies embedded within the right shoulder/upper extremity soft tissues. IMPRESSION: No evidence of acute cardiopulmonary abnormality. Redemonstrated foci of scarring within the perihilar right lung and right lung base. Aortic Atherosclerosis (ICD10-I70.0). Electronically Signed   By: Kellie Simmering D.O.   On: 05/31/2022 13:49     Subjective: Patient was  seen and examined at bedside.  Overnight events noted.   Patient reports feeling much improved.  Patient is being discharged to SNF for rehab.  Discharge Exam: Vitals:   06/19/22 2029 06/20/22 0524  BP: (!) 143/92 (!) 154/99  Pulse: 69 76  Resp: 17 16  Temp: 98.1 F (36.7 C) 98.1 F (36.7 C)  SpO2: 98% 100%   Vitals:   06/18/22 1828 06/19/22 1243 06/19/22 2029 06/20/22 0524  BP: (!) 108/57 129/88 (!) 143/92 (!) 154/99  Pulse: 79 76 69 76  Resp: $Remo'18 18 17 16  'ShMPr$ Temp: 98.5 F (36.9 C) 97.6 F (36.4 C) 98.1 F (36.7 C) 98.1 F (36.7 C)  TempSrc: Oral Oral Oral   SpO2: 100% 98% 98% 100%  Weight:      Height:        General: Pt is alert, awake, not in acute distress Cardiovascular: RRR, S1/S2 +, no rubs, no gallops Respiratory: CTA bilaterally, no wheezing, no rhonchi Abdominal: Soft, NT, ND, bowel sounds + Extremities: no edema, no cyanosis    The results of significant diagnostics from this hospitalization (including imaging, microbiology, ancillary and laboratory) are listed below for reference.     Microbiology: Recent Results (from the past 240 hour(s))  SARS Coronavirus 2 by RT PCR (hospital order, performed in [facility] hospital lab) *cepheid single result test* Anterior Nasal Swab     Status: None   Collection Time: 06/13/22  8:26 PM   Specimen: Anterior Nasal Swab  Result Value Ref Range Status   SARS Coronavirus 2 by RT PCR NEGATIVE NEGATIVE Final    Comment: (NOTE) SARS-CoV-2 target nucleic acids are NOT DETECTED.  The SARS-CoV-2 RNA is generally detectable in upper and lower respiratory specimens during the acute phase of infection. The lowest concentration of SARS-CoV-2 viral copies this assay can detect is 250 copies / mL. A negative result does not preclude SARS-CoV-2 infection and should not be used as the sole basis for treatment or other patient management decisions.  A negative result may occur with improper specimen collection / handling,  submission of specimen other than nasopharyngeal swab, presence of viral  mutation(s) within the areas targeted by this assay, and inadequate number of viral copies (<250 copies / mL). A negative result must be combined with clinical observations, patient history, and epidemiological information.  Fact Sheet for Patients:   https://www.patel.info/  Fact Sheet for Healthcare Providers: https://hall.com/  This test is not yet approved or  cleared by the [place] FDA and has been authorized for detection and/or diagnosis of SARS-CoV-2 by FDA under an Emergency Use Authorization (EUA).  This EUA will remain in effect (meaning this test can be used) for the duration of the COVID-19 declaration under Section 564(b)(1) of the Act, 21 U.S.C. section 360bbb-3(b)(1), unless the authorization is terminated or revoked sooner.  Performed at [facility], [place] [address] [place], [place] [address]   Culture, blood (Routine X 2) w Reflex to ID Panel     Status: None   Collection Time: 06/14/22  8:36 AM   Specimen: BLOOD RIGHT HAND  Result Value Ref Range Status   Specimen Description   Final    BLOOD RIGHT HAND Performed at [place] [address] [place], [place] [address]    Special Requests   Final    IN PEDIATRIC BOTTLE Blood Culture adequate volume Performed at [place] [address], [place], [place] [address]    Culture   Final    NO GROWTH 5 DAYS Performed at [facility], [place] [address], [place], [place] [address]    Report Status 06/19/2022 FINAL  Final  Culture, blood (Routine X 2) w Reflex to ID Panel     Status: None   Collection Time: 06/14/22  8:46 AM   Specimen: BLOOD  Result Value Ref Range Status   Specimen Description   Final    BLOOD BLOOD LEFT FOREARM Performed at [place] [address], [place], [place] [address]     Special Requests   Final    BOTTLES DRAWN AEROBIC ONLY Blood Culture results may not be optimal due to an inadequate volume of blood received in culture bottles Performed at [place] [address] [place], [place] [address]    Culture   Final    NO GROWTH 5 DAYS Performed at [facility], [place] [address] [place],  [address]    Report Status 06/19/2022 FINAL  Final     Labs: BNP (last 3 results) No results for input(s): "BNP" in the last 8760 hours. Basic Metabolic Panel: Recent Labs  Lab 06/15/22 0337 06/16/22 0450 06/17/22 0302 06/18/22 0824 06/19/22 0742 06/20/22 0352  NA 139 140 141 142 142 141  K 3.7 3.8 3.6 3.5 3.3* 3.9  CL 110 114* 114* 115* 114* 114*  CO2 $Re'23 22 24 22 23 22  'dyk$ GLUCOSE 128* 106* 93 96 99 104*  BUN 33* 24* $Remov'18 14 11 12  'FzKPsr$ CREATININE 1.40* 1.23 1.09 1.24 1.04 1.05  CALCIUM 8.1* 7.9* 8.1* 8.2* 8.5* 8.6*  MG 1.9 1.7 1.8 1.8 1.7 1.9  PHOS 3.0  --  2.1* 2.2*  --  2.7   Liver Function Tests: Recent Labs  Lab 06/14/22 0748 06/15/22 0337 06/16/22 0450 06/17/22 0302 06/18/22 0824 06/19/22 0742  AST 69* 70* 53* 62*  --  90*  ALT 36 37 32 39  --  72*  ALKPHOS 123 129* 112 115  --  132*  BILITOT 0.6 0.5 0.6 0.5  --  0.6  PROT 4.9* 4.9* 4.5* 4.6*  --  5.7*  ALBUMIN 2.7* 2.7* 2.4* 2.5* 2.6* 3.0*   Recent Labs  Lab 06/13/22 2100  LIPASE 69*   Recent Labs  Lab 06/13/22 2100  AMMONIA 14   CBC: Recent Labs  Lab 06/15/22 0337 06/16/22 0450 06/17/22 0302 06/18/22 0824 06/19/22 0742 06/20/22 0352  WBC 0.9* 4.9 7.0 5.2 3.0* 1.9*  NEUTROABS 0.2* 2.9 6.4 2.5 0.9*  --   HGB 9.5* 9.2* 8.9* 9.8* 10.5* 9.6*  HCT 29.4* 28.6* 27.2* 30.0* 31.6* 30.0*  MCV 97.7 97.9 96.5 96.5 94.6 96.5  PLT 35* 33* 33* 42* 60* 62*   Cardiac Enzymes: Recent Labs  Lab 06/14/22 0846 06/18/22 0824  CKTOTAL 124 29*   BNP: Invalid input(s): "POCBNP" CBG: Recent Labs  Lab 06/13/22 1949 06/14/22 2226 06/15/22 0200  GLUCAP 106*  86 133*   D-Dimer No results for input(s): "DDIMER" in the last 72 hours. Hgb A1c No results for input(s): "HGBA1C" in the last 72 hours. Lipid Profile No results for input(s): "CHOL", "HDL", "LDLCALC", "TRIG", "CHOLHDL", "LDLDIRECT" in the last 72 hours. Thyroid function studies No results for input(s): "TSH", "T4TOTAL", "T3FREE", "THYROIDAB" in the last 72 hours.  Invalid input(s): "FREET3" Anemia work up No results for input(s): "VITAMINB12", "FOLATE", "FERRITIN", "TIBC", "IRON", "RETICCTPCT" in the last 72 hours. Urinalysis    Component Value Date/Time   COLORURINE YELLOW 06/19/2022 1242   APPEARANCEUR CLEAR 06/19/2022 1242   LABSPEC 1.014 06/19/2022 1242   PHURINE 7.0 06/19/2022 1242   GLUCOSEU NEGATIVE 06/19/2022 1242   GLUCOSEU NEGATIVE 08/11/2017 0843   HGBUR NEGATIVE 06/19/2022 1242   HGBUR negative 03/20/2010 0843   BILIRUBINUR NEGATIVE 06/19/2022 1242   BILIRUBINUR n 03/22/2011 0000   KETONESUR NEGATIVE 06/19/2022 1242   PROTEINUR 30 (A) 06/19/2022 1242   UROBILINOGEN 0.2 08/11/2017 0843   NITRITE NEGATIVE 06/19/2022 1242   LEUKOCYTESUR NEGATIVE 06/19/2022 1242   Sepsis Labs Recent Labs  Lab 06/17/22 0302 06/18/22 0824 06/19/22 0742 06/20/22 0352  WBC 7.0 5.2 3.0* 1.9*   Microbiology Recent Results (from the past 240 hour(s))  SARS Coronavirus 2 by RT PCR (hospital order, performed in [facility] hospital lab) *cepheid single result test* Anterior Nasal Swab     Status: None   Collection Time: 06/13/22  8:26 PM   Specimen: Anterior Nasal Swab  Result Value Ref Range Status   SARS Coronavirus 2 by RT PCR NEGATIVE NEGATIVE Final    Comment: (NOTE) SARS-CoV-2 target nucleic acids are NOT DETECTED.  The SARS-CoV-2 RNA is generally detectable in upper and lower respiratory specimens during the acute phase of infection. The lowest concentration of SARS-CoV-2 viral copies this assay can detect is 250 copies / mL. A negative result does not preclude  SARS-CoV-2 infection and should not be used as the sole basis for treatment or other patient management decisions.  A negative result may occur with improper specimen collection / handling, submission of specimen other than nasopharyngeal swab, presence of viral mutation(s) within the areas targeted by this assay, and inadequate number of viral copies (<250 copies / mL). A negative result must be combined with clinical observations, patient history, and epidemiological information.  Fact Sheet for Patients:   https://www.patel.info/  Fact Sheet for Healthcare Providers: https://hall.com/  This test is not yet approved or  cleared by the [place] FDA and has been authorized for detection and/or diagnosis of SARS-CoV-2 by FDA under an Emergency Use Authorization (EUA).  This EUA will remain in effect (meaning this test can be used) for the duration of the  COVID-19 declaration under Section 564(b)(1) of the Act, 21 U.S.C. section 360bbb-3(b)(1), unless the authorization is terminated or revoked sooner.  Performed at [facility], [place] [address] [place], [place] [address]   Culture, blood (Routine X 2) w Reflex to ID Panel     Status: None   Collection Time: 06/14/22  8:36 AM   Specimen: BLOOD RIGHT HAND  Result Value Ref Range Status   Specimen Description   Final    BLOOD RIGHT HAND Performed at [place] [address], [place], [place] [address]    Special Requests   Final    IN PEDIATRIC BOTTLE Blood Culture adequate volume Performed at [place] [address] [place], [place] [address]    Culture   Final    NO GROWTH 5 DAYS Performed at [facility], [place] [address] [place], [place] [address]    Report Status 06/19/2022 FINAL  Final  Culture, blood (Routine X 2) w Reflex to ID Panel     Status: None   Collection Time: 06/14/22  8:46 AM   Specimen:  BLOOD  Result Value Ref Range Status   Specimen Description   Final    BLOOD BLOOD LEFT FOREARM Performed at [place] [address] [place], [place] [address]    Special Requests   Final    BOTTLES DRAWN AEROBIC ONLY Blood Culture results may not be optimal due to an inadequate volume of blood received in culture bottles Performed at [place] [address] [place], [place] [address]    Culture   Final    NO GROWTH 5 DAYS Performed at [facility], [place] [address] [place],  [address]    Report Status 06/19/2022 FINAL  Final     Time coordinating discharge: Over 30 minutes  SIGNED:   Shawna Clamp, MD  [facility] 06/20/2022, 11:27 AM Pager   If 7PM-7AM, please contact night-coverage

## 2022-06-20 NOTE — Progress Notes (Signed)
Attempted to call report '@x'$ . [phone], 1323. Left primary nurse direct number for a return call. Phone umber called [place]

## 2022-06-20 NOTE — Progress Notes (Signed)
[facility] for Warfarin Indication: h/o recurrent PE, PAFib  Allergies  Allergen Reactions   Hydrocodone-Acetaminophen Shortness Of Breath   Oxycodone Hcl Shortness Of Breath   Telmisartan-Hctz Shortness Of Breath, Palpitations and Other (See Comments)    Dizziness also   Aspirin Rash and Other (See Comments)    Confusion, also- Cannot take high doses   Atorvastatin Other (See Comments)    Myalgia    Buspirone Hcl Other (See Comments)    Headaches    Codeine Hives, Nausea And Vomiting and Other (See Comments)    Confusion also   Morphine Sulfate Itching   Paroxetine Other (See Comments)    Paresthesias: R arm, R leg, r side of face   Rabeprazole Other (See Comments)    headache   Ramipril Other (See Comments)    Unknown reaction    Patient Measurements: Height: '5\' 10"'$  (177.8 cm) Weight: 81.7 kg (180 lb 1.9 oz) IBW/kg (Calculated) : 73  Vital Signs: Temp: 98.1 F (36.7 C) (08/10 0524) BP: 154/99 (08/10 0524) Pulse Rate: 76 (08/10 0524)  Labs: Recent Labs    06/18/22 0824 06/19/22 0742 06/20/22 0352  HGB 9.8* 10.5* 9.6*  HCT 30.0* 31.6* 30.0*  PLT 42* 60* 62*  LABPROT 28.4* 30.0* 31.4*  INR 2.7* 2.9* 3.1*  CREATININE 1.24 1.04 1.05  CKTOTAL 29*  --   --      Estimated Creatinine Clearance: 61.8 mL/min (by C-G formula based on SCr of 1.05 mg/dL).  Medications:  PTA Warfarin  Assessment: 76 yr male with CLL undergoing chemotherapy.  PMH significant for  CKD, recurent PE, PAF Med history documented patient states takes warfarin occasionally and is unsure of how was supposed to be taking this medication. Stated he took Warfarin '15mg'$  on Fri and Sat, then '10mg'$  on Mon, Wed and Thur Per Onc note 7/7, dose listed '15mg'$  Sun/Thu, '10mg'$  all other days  Today, 06/20/2022 INR slightly above goal at 3.1 following large jump and warfarin held on 8/8.  Reduced dose of warfarin 5 mg administered yesterday. CBC: hgb low but improving;  Plt low at baseline d/t MM, but improving; now > 50k (Nplate given 8/9) No bleeding reported per RN Possible drug interaction with metronidazole (stopped 8/7; got 4 days worth - can increase warfarin sensitivity)  Goal of Therapy:  INR goal 2.5-3.0 per Onc note 7/7   Plan:  Warfarin 5 mg today Daily PT/INR   Thank you for allowing pharmacy to be a part of this patient's care.  Royetta Asal, PharmD, BCPS Clinical Pharmacist [place] Please utilize Amion for appropriate phone number to reach the unit pharmacist ([place]) 06/20/2022 9:03 AM

## 2022-06-20 NOTE — Progress Notes (Signed)
Unfortunately, his white cell count is coming back down again.  He must have some element of myelodysplasia.  Another possibility might be some autoimmune issue.  He clearly will need scheduled G-CSF.  I will put him on some Neupogen today.  His platelet count is coming up.  Platelet count 62,000.  He got Nplate yesterday.  His hemoglobin is stable at 9.6.  His electrolytes look pretty good.  Overall, his mental state is improved.  He was pretty lucid today.  We had nice conversations.  He was much more aware.  It sounds like he may be discharged today.  I will have to leave this up to the hospital doctor.  He would like to go home.  I am unsure Mr. Wallman will be going home.  He says that he is eating a little bit better.  There is no nausea or vomiting.  Looks like he did well with physical therapy yesterday.  I am glad to see that he is finally beginning to come around a little bit more.  I am going to stop the acyclovir.  Sometimes this can lead to leukopenia.  He has had no fever.  He is still off antibiotics.  There is no diarrhea.  There is no bleeding.  His vital signs are temperature 98.1.  Pulse 76.  Blood pressure 154/99.  His head and neck exam shows no ocular or oral lesions.  He has no adenopathy in the neck.  Lungs are clear bilaterally.  Cardiac exam regular rate and rhythm.  He has a 1/6 systolic ejection murmur.  Abdomen is soft.  He has good bowel sounds.  There is no fluid wave.  There is no palpable liver or spleen tip.  Extremity shows no clubbing, cyanosis or edema.  Neurological exam shows no focal neurological deficits.  Again, he may be discharged today.  Again I am unsure if his and go to rehab or go home.  He will need weekly Neupogen.  I think we are going to have to try to keep his white cell count up to help prevent him from having issues and being readmitted.  I do appreciate everybody's help on 4 W.  Everybody has done an incredibly compassionate job.   Lattie Haw, MD  Psalms 62:6

## 2022-06-21 ENCOUNTER — Telehealth: Payer: Self-pay | Admitting: Family Medicine

## 2022-06-21 NOTE — Telephone Encounter (Signed)
FYI

## 2022-06-21 NOTE — Telephone Encounter (Signed)
[name] I believe he had a visit while hospitalized-just want to make sure he is not charged for this

## 2022-06-21 NOTE — Telephone Encounter (Signed)
I have updated this in his appt desk.

## 2022-06-21 NOTE — Telephone Encounter (Signed)
Glad he is getting help to improve

## 2022-06-21 NOTE — Telephone Encounter (Signed)
FYI States patient was discharged from [facility] and admitted into rehabilitation.

## 2022-06-24 ENCOUNTER — Inpatient Hospital Stay (HOSPITAL_BASED_OUTPATIENT_CLINIC_OR_DEPARTMENT_OTHER)
Admission: EM | Admit: 2022-06-24 | Discharge: 2022-07-12 | DRG: 098 | Disposition: E | Payer: No Typology Code available for payment source | Attending: Family Medicine | Admitting: Family Medicine

## 2022-06-24 ENCOUNTER — Other Ambulatory Visit: Payer: Self-pay | Admitting: Family

## 2022-06-24 ENCOUNTER — Encounter (HOSPITAL_BASED_OUTPATIENT_CLINIC_OR_DEPARTMENT_OTHER): Payer: Self-pay

## 2022-06-24 ENCOUNTER — Inpatient Hospital Stay: Payer: No Typology Code available for payment source

## 2022-06-24 ENCOUNTER — Encounter (HOSPITAL_COMMUNITY): Payer: Self-pay

## 2022-06-24 ENCOUNTER — Emergency Department (HOSPITAL_BASED_OUTPATIENT_CLINIC_OR_DEPARTMENT_OTHER): Payer: No Typology Code available for payment source

## 2022-06-24 ENCOUNTER — Other Ambulatory Visit: Payer: Self-pay

## 2022-06-24 ENCOUNTER — Inpatient Hospital Stay: Payer: No Typology Code available for payment source | Admitting: Family

## 2022-06-24 DIAGNOSIS — Z7901 Long term (current) use of anticoagulants: Secondary | ICD-10-CM

## 2022-06-24 DIAGNOSIS — N179 Acute kidney failure, unspecified: Secondary | ICD-10-CM | POA: Diagnosis present

## 2022-06-24 DIAGNOSIS — R339 Retention of urine, unspecified: Secondary | ICD-10-CM | POA: Diagnosis present

## 2022-06-24 DIAGNOSIS — R4189 Other symptoms and signs involving cognitive functions and awareness: Principal | ICD-10-CM

## 2022-06-24 DIAGNOSIS — Z9221 Personal history of antineoplastic chemotherapy: Secondary | ICD-10-CM

## 2022-06-24 DIAGNOSIS — R5081 Fever presenting with conditions classified elsewhere: Secondary | ICD-10-CM

## 2022-06-24 DIAGNOSIS — N1832 Chronic kidney disease, stage 3b: Secondary | ICD-10-CM | POA: Diagnosis present

## 2022-06-24 DIAGNOSIS — I350 Nonrheumatic aortic (valve) stenosis: Secondary | ICD-10-CM

## 2022-06-24 DIAGNOSIS — Z20822 Contact with and (suspected) exposure to covid-19: Secondary | ICD-10-CM | POA: Diagnosis present

## 2022-06-24 DIAGNOSIS — Z923 Personal history of irradiation: Secondary | ICD-10-CM

## 2022-06-24 DIAGNOSIS — Z953 Presence of xenogenic heart valve: Secondary | ICD-10-CM

## 2022-06-24 DIAGNOSIS — D708 Other neutropenia: Secondary | ICD-10-CM

## 2022-06-24 DIAGNOSIS — G0490 Encephalitis and encephalomyelitis, unspecified: Principal | ICD-10-CM | POA: Diagnosis present

## 2022-06-24 DIAGNOSIS — Z86711 Personal history of pulmonary embolism: Secondary | ICD-10-CM

## 2022-06-24 DIAGNOSIS — I130 Hypertensive heart and chronic kidney disease with heart failure and stage 1 through stage 4 chronic kidney disease, or unspecified chronic kidney disease: Secondary | ICD-10-CM | POA: Diagnosis present

## 2022-06-24 DIAGNOSIS — E871 Hypo-osmolality and hyponatremia: Secondary | ICD-10-CM | POA: Diagnosis present

## 2022-06-24 DIAGNOSIS — Z87891 Personal history of nicotine dependence: Secondary | ICD-10-CM

## 2022-06-24 DIAGNOSIS — G9340 Encephalopathy, unspecified: Secondary | ICD-10-CM

## 2022-06-24 DIAGNOSIS — C9110 Chronic lymphocytic leukemia of B-cell type not having achieved remission: Secondary | ICD-10-CM | POA: Diagnosis present

## 2022-06-24 DIAGNOSIS — Z79899 Other long term (current) drug therapy: Secondary | ICD-10-CM | POA: Diagnosis not present

## 2022-06-24 DIAGNOSIS — D500 Iron deficiency anemia secondary to blood loss (chronic): Secondary | ICD-10-CM | POA: Diagnosis not present

## 2022-06-24 DIAGNOSIS — I5042 Chronic combined systolic (congestive) and diastolic (congestive) heart failure: Secondary | ICD-10-CM | POA: Diagnosis present

## 2022-06-24 DIAGNOSIS — G928 Other toxic encephalopathy: Secondary | ICD-10-CM | POA: Diagnosis present

## 2022-06-24 DIAGNOSIS — D61818 Other pancytopenia: Secondary | ICD-10-CM | POA: Diagnosis not present

## 2022-06-24 DIAGNOSIS — Z8546 Personal history of malignant neoplasm of prostate: Secondary | ICD-10-CM

## 2022-06-24 DIAGNOSIS — Z8042 Family history of malignant neoplasm of prostate: Secondary | ICD-10-CM

## 2022-06-24 DIAGNOSIS — D693 Immune thrombocytopenic purpura: Secondary | ICD-10-CM | POA: Diagnosis present

## 2022-06-24 DIAGNOSIS — D689 Coagulation defect, unspecified: Secondary | ICD-10-CM | POA: Diagnosis present

## 2022-06-24 DIAGNOSIS — I5032 Chronic diastolic (congestive) heart failure: Secondary | ICD-10-CM | POA: Diagnosis present

## 2022-06-24 DIAGNOSIS — Z515 Encounter for palliative care: Secondary | ICD-10-CM | POA: Diagnosis not present

## 2022-06-24 DIAGNOSIS — I700 Atherosclerosis of aorta: Secondary | ICD-10-CM | POA: Diagnosis present

## 2022-06-24 DIAGNOSIS — E785 Hyperlipidemia, unspecified: Secondary | ICD-10-CM | POA: Diagnosis not present

## 2022-06-24 DIAGNOSIS — Z9079 Acquired absence of other genital organ(s): Secondary | ICD-10-CM

## 2022-06-24 DIAGNOSIS — Z66 Do not resuscitate: Secondary | ICD-10-CM | POA: Diagnosis not present

## 2022-06-24 DIAGNOSIS — I480 Paroxysmal atrial fibrillation: Secondary | ICD-10-CM | POA: Diagnosis present

## 2022-06-24 DIAGNOSIS — Z800 Family history of malignant neoplasm of digestive organs: Secondary | ICD-10-CM

## 2022-06-24 DIAGNOSIS — I951 Orthostatic hypotension: Secondary | ICD-10-CM | POA: Diagnosis present

## 2022-06-24 DIAGNOSIS — Z952 Presence of prosthetic heart valve: Secondary | ICD-10-CM

## 2022-06-24 DIAGNOSIS — R4182 Altered mental status, unspecified: Secondary | ICD-10-CM | POA: Diagnosis present

## 2022-06-24 DIAGNOSIS — S40029A Contusion of unspecified upper arm, initial encounter: Secondary | ICD-10-CM

## 2022-06-24 DIAGNOSIS — I2699 Other pulmonary embolism without acute cor pulmonale: Secondary | ICD-10-CM | POA: Diagnosis present

## 2022-06-24 DIAGNOSIS — I471 Supraventricular tachycardia: Secondary | ICD-10-CM | POA: Diagnosis not present

## 2022-06-24 DIAGNOSIS — G9341 Metabolic encephalopathy: Secondary | ICD-10-CM

## 2022-06-24 DIAGNOSIS — D709 Neutropenia, unspecified: Secondary | ICD-10-CM

## 2022-06-24 DIAGNOSIS — Z823 Family history of stroke: Secondary | ICD-10-CM

## 2022-06-24 LAB — CBC WITH DIFFERENTIAL/PLATELET
Abs Immature Granulocytes: 0.04 10*3/uL (ref 0.00–0.07)
Basophils Absolute: 0 10*3/uL (ref 0.0–0.1)
Basophils Relative: 1 %
Eosinophils Absolute: 0 10*3/uL (ref 0.0–0.5)
Eosinophils Relative: 0 %
HCT: 34.6 % — ABNORMAL LOW (ref 39.0–52.0)
Hemoglobin: 11.7 g/dL — ABNORMAL LOW (ref 13.0–17.0)
Immature Granulocytes: 1 %
Lymphocytes Relative: 66 %
Lymphs Abs: 2 10*3/uL (ref 0.7–4.0)
MCH: 31.6 pg (ref 26.0–34.0)
MCHC: 33.8 g/dL (ref 30.0–36.0)
MCV: 93.5 fL (ref 80.0–100.0)
Monocytes Absolute: 0.3 10*3/uL (ref 0.1–1.0)
Monocytes Relative: 9 %
Neutro Abs: 0.7 10*3/uL — ABNORMAL LOW (ref 1.7–7.7)
Neutrophils Relative %: 23 %
Platelets: 84 10*3/uL — ABNORMAL LOW (ref 150–400)
RBC: 3.7 MIL/uL — ABNORMAL LOW (ref 4.22–5.81)
RDW: 14.3 % (ref 11.5–15.5)
WBC: 3 10*3/uL — ABNORMAL LOW (ref 4.0–10.5)
nRBC: 0 % (ref 0.0–0.2)

## 2022-06-24 LAB — BASIC METABOLIC PANEL
Anion gap: 8 (ref 5–15)
BUN: 17 mg/dL (ref 8–23)
CO2: 23 mmol/L (ref 22–32)
Calcium: 9.4 mg/dL (ref 8.9–10.3)
Chloride: 103 mmol/L (ref 98–111)
Creatinine, Ser: 1.35 mg/dL — ABNORMAL HIGH (ref 0.61–1.24)
GFR, Estimated: 54 mL/min — ABNORMAL LOW (ref >60)
Glucose, Bld: 94 mg/dL (ref 70–99)
Potassium: 4.6 mmol/L (ref 3.5–5.1)
Sodium: 134 mmol/L — ABNORMAL LOW (ref 135–145)

## 2022-06-24 LAB — RAPID URINE DRUG SCREEN, HOSP PERFORMED
Amphetamines: NOT DETECTED
Barbiturates: NOT DETECTED
Benzodiazepines: NOT DETECTED
Cocaine: NOT DETECTED
Opiates: NOT DETECTED
Tetrahydrocannabinol: NOT DETECTED

## 2022-06-24 LAB — HEPATIC FUNCTION PANEL
ALT: 37 U/L (ref 0–44)
AST: 26 U/L (ref 15–41)
Albumin: 4 g/dL (ref 3.5–5.0)
Alkaline Phosphatase: 144 U/L — ABNORMAL HIGH (ref 38–126)
Bilirubin, Direct: 0.3 mg/dL — ABNORMAL HIGH (ref 0.0–0.2)
Indirect Bilirubin: 0.4 mg/dL (ref 0.3–0.9)
Total Bilirubin: 0.7 mg/dL (ref 0.3–1.2)
Total Protein: 6.2 g/dL — ABNORMAL LOW (ref 6.5–8.1)

## 2022-06-24 LAB — ETHANOL: Alcohol, Ethyl (B): <10 mg/dL (ref <10)

## 2022-06-24 LAB — URINALYSIS, MICROSCOPIC (REFLEX)

## 2022-06-24 LAB — TROPONIN I (HIGH SENSITIVITY): Troponin I (High Sensitivity): 20 ng/L — ABNORMAL HIGH (ref <18)

## 2022-06-24 LAB — PROTIME-INR
INR: 2.1 — ABNORMAL HIGH (ref 0.8–1.2)
Prothrombin Time: 23 seconds — ABNORMAL HIGH (ref 11.4–15.2)

## 2022-06-24 LAB — URINALYSIS, ROUTINE W REFLEX MICROSCOPIC
Bilirubin Urine: NEGATIVE
Glucose, UA: NEGATIVE mg/dL
Hgb urine dipstick: NEGATIVE
Ketones, ur: NEGATIVE mg/dL
Leukocytes,Ua: NEGATIVE
Nitrite: NEGATIVE
Protein, ur: 30 mg/dL — AB
Specific Gravity, Urine: 1.015 (ref 1.005–1.030)
pH: 7 (ref 5.0–8.0)

## 2022-06-24 LAB — SARS CORONAVIRUS 2 BY RT PCR: SARS Coronavirus 2 by RT PCR: NEGATIVE

## 2022-06-24 LAB — MAGNESIUM: Magnesium: 2 mg/dL (ref 1.7–2.4)

## 2022-06-24 LAB — AMMONIA: Ammonia: 21 umol/L (ref 9–35)

## 2022-06-24 MED ORDER — MIDODRINE HCL 5 MG PO TABS
2.5000 mg | ORAL_TABLET | Freq: Three times a day (TID) | ORAL | Status: DC
  Administered 2022-06-25 – 2022-06-28 (×9): 2.5 mg via ORAL
  Filled 2022-06-24 (×11): qty 1

## 2022-06-24 MED ORDER — DOCUSATE SODIUM 100 MG PO CAPS
100.0000 mg | ORAL_CAPSULE | Freq: Every day | ORAL | Status: DC
  Administered 2022-06-25 – 2022-06-26 (×2): 100 mg via ORAL
  Filled 2022-06-24 (×2): qty 1

## 2022-06-24 MED ORDER — TRAMADOL HCL 50 MG PO TABS
50.0000 mg | ORAL_TABLET | Freq: Four times a day (QID) | ORAL | Status: DC | PRN
  Administered 2022-06-27: 50 mg via ORAL
  Filled 2022-06-24: qty 1

## 2022-06-24 MED ORDER — WARFARIN SODIUM 5 MG PO TABS
5.0000 mg | ORAL_TABLET | Freq: Once | ORAL | Status: AC
  Administered 2022-06-24: 5 mg via ORAL
  Filled 2022-06-24: qty 1

## 2022-06-24 MED ORDER — NALOXONE HCL 4 MG/0.1ML NA LIQD
1.0000 | Freq: Once | NASAL | Status: AC
  Administered 2022-06-24: 1 via NASAL

## 2022-06-24 MED ORDER — WARFARIN SODIUM 5 MG PO TABS
10.0000 mg | ORAL_TABLET | Freq: Once | ORAL | Status: AC
  Administered 2022-06-24: 10 mg via ORAL
  Filled 2022-06-24: qty 2

## 2022-06-24 MED ORDER — NALOXONE HCL 0.4 MG/ML IJ SOLN: 0.4000 mg | Freq: Once | INTRAMUSCULAR | Status: DC

## 2022-06-24 MED ORDER — SODIUM CHLORIDE 0.9 % IV BOLUS
1000.0000 mL | Freq: Once | INTRAVENOUS | Status: AC
  Administered 2022-06-24: 1000 mL via INTRAVENOUS

## 2022-06-24 MED ORDER — SODIUM CHLORIDE 0.9 % IV SOLN: INTRAVENOUS | Status: DC

## 2022-06-24 MED ORDER — ROSUVASTATIN CALCIUM 10 MG PO TABS
5.0000 mg | ORAL_TABLET | Freq: Every day | ORAL | Status: DC
  Administered 2022-06-25 – 2022-06-27 (×3): 5 mg via ORAL
  Filled 2022-06-24 (×3): qty 1

## 2022-06-24 MED ORDER — MEGESTROL ACETATE 400 MG/10ML PO SUSP
400.0000 mg | Freq: Two times a day (BID) | ORAL | Status: DC
  Administered 2022-06-24: 400 mg via ORAL
  Filled 2022-06-24: qty 10

## 2022-06-24 MED ORDER — SERTRALINE HCL 100 MG PO TABS
100.0000 mg | ORAL_TABLET | Freq: Every morning | ORAL | Status: DC
  Administered 2022-06-25 – 2022-06-27 (×3): 100 mg via ORAL
  Filled 2022-06-24 (×3): qty 1

## 2022-06-24 MED ORDER — SENNOSIDES-DOCUSATE SODIUM 8.6-50 MG PO TABS: 1.0000 | ORAL_TABLET | Freq: Every evening | ORAL | Status: DC | PRN

## 2022-06-24 MED ORDER — ACYCLOVIR 400 MG PO TABS
400.0000 mg | ORAL_TABLET | Freq: Every day | ORAL | Status: DC
  Administered 2022-06-25 – 2022-06-27 (×3): 400 mg via ORAL
  Filled 2022-06-24 (×4): qty 1

## 2022-06-24 MED ORDER — WARFARIN - PHARMACIST DOSING INPATIENT
Freq: Every day | Status: DC
  Filled 2022-06-24: qty 1

## 2022-06-24 MED ORDER — VITAMIN D 25 MCG (1000 UNIT) PO TABS
2000.0000 [IU] | ORAL_TABLET | Freq: Every morning | ORAL | Status: DC
  Administered 2022-06-25 – 2022-06-27 (×3): 2000 [IU] via ORAL
  Filled 2022-06-24 (×3): qty 2

## 2022-06-24 MED ORDER — ONDANSETRON HCL 4 MG/2ML IJ SOLN: 4.0000 mg | Freq: Four times a day (QID) | INTRAMUSCULAR | Status: DC | PRN

## 2022-06-24 MED ORDER — ALBUTEROL SULFATE (2.5 MG/3ML) 0.083% IN NEBU
2.5000 mg | INHALATION_SOLUTION | RESPIRATORY_TRACT | Status: DC | PRN
  Administered 2022-06-26 – 2022-06-27 (×3): 2.5 mg via RESPIRATORY_TRACT
  Filled 2022-06-24 (×2): qty 3

## 2022-06-24 MED ORDER — ONDANSETRON HCL 4 MG PO TABS: 4.0000 mg | ORAL_TABLET | Freq: Four times a day (QID) | ORAL | Status: DC | PRN

## 2022-06-24 MED ORDER — ENOXAPARIN SODIUM 40 MG/0.4ML IJ SOSY: 40.0000 mg | PREFILLED_SYRINGE | INTRAMUSCULAR | Status: DC

## 2022-06-24 NOTE — ED Notes (Signed)
Due to poor venous access only one (1) set of BC obtained from Rt forearm

## 2022-06-24 NOTE — ED Notes (Signed)
Phone handoff report given to receiving RN at [facility]

## 2022-06-24 NOTE — ED Notes (Signed)
BG=[id number]

## 2022-06-24 NOTE — H&P (Addendum)
History and Physical    [name] LXB:[id number] DOB: [birth year] DOA: 07/07/2022  PCP: [name], MD   Patient coming from: Home  I have personally briefly reviewed patient's old medical records in [place]  Chief Complaint: Altered mental status  HPI: [name] is a 76 y.o. male with medical history significant of CLL,  bioprosthetic aortic valve replacement on Coumadin, CKD stage IIIb,recurrent PE on Coumadin, of paroxysmal atrial fibrillation, recent hospitalization from 06/14/2022 - 06/20/2022 for altered mental status, AKI and neutropenia.  During this hospitalization, oncology was consulted and patient underwent bone marrow biopsy; treated with Granix for neutropenia by oncology and recommended outpatient follow-up with oncology.  Ammonia, B12 and TSH levels were normal.  MRI of brain was unrevealing; there was a question of seizure-like activity while getting an MRI of brain; EEG was negative for seizures; neurology evaluated the patient and subsequently signed off.  This was attributed to orthostatic hypotension and patient was prescribed midodrine.  He was subsequently discharged to SNF on 06/20/2022.  He presented today to oncology/Dr. Antonieta Pert office for follow-up.  He was found in the parking lot unresponsive at [facility] and subsequently taken to the ED.  Patient is a poor historian and does not know what happened.  No further history could be obtained.  ED Course: His mental status improved after intranasal administration of Narcan.  He subsequently remained somnolent and confused.  CT of the head was negative for any acute intracranial abnormity.  Sodium 134, creatinine 1.35, troponin 20, platelets 84 (improved from 62 on 06/20/2022). Hospitalist service was called to evaluate the patient.  Review of Systems: Could not be obtained because of patient's confusion.  Past Medical History:  Diagnosis Date   Anticoagulants causing adverse effect in  therapeutic use    Anxiety    Paxil seemed to cause odd neuro side effects; better on prozac as of 09/2015   Aortic regurgitation 04/2007 surgery   Resolved with tissue AVR at [facility]   Cervical spondylosis    ESI by [name] in [place]. 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 (most recent 07/2017)   History of prostate cancer 2003   Rad prost   History of pulmonary embolism 2011; 10/2014   Recurrence off of anticoagulation 10/2014: per hem/onc ([name]) pt needs lifelong anticoagulation (hypercoag w/u neg).   Hyperlipidemia    statin-intolerant, except for crestor low dose.   Hypertension    Migraine syndrome    Mitral regurgitation    PAF (paroxysmal atrial fibrillation) (HCC)    Panic attacks    "           "             "                  "                  "                       "                             "  Pulmonary nodule, right 2015   RLL: resected at [facility] --VATS; Benign RLL nodule (fungal and AFB stains neg).  Plan is for [facility] thoracic surg to do f/u o/v & CT chest 1 yr     Past Surgical History:  Procedure Laterality Date   AORTIC VALVE REPLACEMENT  2011   [place]  (bioprosthetic)   CARDIAC CATHETERIZATION  04/2010   Normal coronaries.   Carotid dopplers  08/2015   NORMAL   CATARACT EXTRACTION     L 12/06/09   R 09/14/09   COLONOSCOPY  01/26/13   tic's, o/w normal.  ([place])--recall 10 yrs.   PENILE PROSTHESIS IMPLANT     PROSTATECTOMY  04/2002   for prostate cancer   Pulmonary nodule resection  Fall/winter 2016   [facility]   SHOULDER SURGERY     rotator cuff on both arms    TEE WITHOUT CARDIOVERSION N/A 07/13/2021   Procedure: TRANSESOPHAGEAL ECHOCARDIOGRAM (TEE);  Surgeon: Pixie Casino, MD;  Location: [facility] ENDOSCOPY;  Service: Cardiovascular;  Laterality: N/A;   TIBIALIS TENDON TRANSFER / REPAIR Right 03/02/2019   Procedure: RECONSTRUCTION OF RIGHT  ANTERIOR  TIBIALIS TENDON;  Surgeon: Erle Crocker, MD;  Location: [place];  Service: Orthopedics;  Laterality: Right;   TRANSTHORACIC ECHOCARDIOGRAM  09/20/2014; 03/2016; 03/2017   2015-mild LVH, EF 50-55%, wall motion nl, grade I diast dysfxn, prosth aort valve good,transaortic gradients decreased compared to echo 11/2013.   03/2016: EF 55-60%, normal LV function, severe LVH, normal diast fxn, mild increase in AV gradient compared to 09/2014 echo.  2018: EF 55-60%, normal LV fxn, grd II DD, AV stable/gradient's stable.     reports that he quit smoking about 41 years ago. His smoking use included cigarettes. He started smoking about 53 years ago. He has a 12.00 pack-year smoking history. He has never used smokeless tobacco. He reports that he does not currently use alcohol. He reports that he does not use drugs.  Allergies  Allergen Reactions   Hydrocodone-Acetaminophen Shortness Of Breath   Oxycodone Hcl Shortness Of Breath   Telmisartan-Hctz Shortness Of Breath, Palpitations and Other (See Comments)    Dizziness also   Aspirin Rash and Other (See Comments)    Confusion, also- Cannot take high doses   Atorvastatin Other (See Comments)    Myalgia    Buspirone Hcl Other (See Comments)    Headaches    Codeine Hives, Nausea And Vomiting and Other (See Comments)    Confusion also   Morphine Sulfate Itching   Paroxetine Other (See Comments)    Paresthesias: R arm, R leg, r side of face   Rabeprazole Other (See Comments)    headache   Ramipril Other (See Comments)    Unknown reaction    Family History  Problem Relation Age of Onset   Cancer Sister        uterine   Colon cancer Sister 44   Stroke Mother    Prostate cancer Father    Stroke Sister    Stroke Brother    Heart attack Neg Hx     Prior to Admission medications   Medication Sig Start Date End Date Taking? Authorizing Provider  acyclovir (ZOVIRAX) 400 MG tablet TAKE 1 TABLET BY MOUTH EVERY DAY Patient  taking differently: Take 400 mg by mouth daily. 04/30/22   Volanda Napoleon, MD  Cholecalciferol (VITAMIN D3) 50 MCG (2000 UT) capsule Take 2,000 Units by mouth every morning.    [provider]  [employer] MOTION SICKNESS  RELIEF 25 MG CHEW CHEW 1 TABLET EVERY DAY AS NEEDED FOR DIZZINESS 01/18/22   [name], MD  docusate sodium (COLACE) 100 MG capsule Take 100 mg by mouth daily.    [provider]  ipratropium (ATROVENT) 0.03 % nasal spray Place 2 sprays into both nostrils every 12 (twelve) hours. Patient taking differently: Place 2 sprays into both nostrils 2 (two) times daily as needed for rhinitis (congestion). 10/04/20   Inda Coke, PA  LORazepam (ATIVAN) 0.5 MG tablet Take 1 tablet (0.5 mg total) by mouth at bedtime as needed for up to 5 days for anxiety (can take 1/4 or 1/2 tablet  if effective.). 06/20/22 06/25/22  Shawna Clamp, MD  megestrol (MEGACE) 400 MG/10ML suspension Take 10 mLs (400 mg total) by mouth 2 (two) times daily. 06/20/22   Shawna Clamp, MD  midodrine (PROAMATINE) 2.5 MG tablet Take 1 tablet (2.5 mg total) by mouth 3 (three) times daily with meals. 06/20/22   Shawna Clamp, MD  omeprazole (PRILOSEC) 40 MG capsule Take 40 mg by mouth every evening. 10/18/20   [provider]  ondansetron (ZOFRAN) 8 MG tablet Take 1 tablet (8 mg total) by mouth 2 (two) times daily as needed for refractory nausea / vomiting. Start on day 2 after bendamustine chemo. 01/03/22   Volanda Napoleon, MD  prochlorperazine (COMPAZINE) 10 MG tablet Take 1 tablet (10 mg total) by mouth every 6 (six) hours as needed (Nausea or vomiting). 01/03/22   Volanda Napoleon, MD  rosuvastatin (CRESTOR) 5 MG tablet Take 5 mg by mouth daily.    [provider]  sertraline (ZOLOFT) 100 MG tablet Take 100 mg by mouth every morning.    [provider]  sodium chloride (OCEAN) 0.65 % SOLN nasal spray Place 1 spray into both nostrils as needed for congestion.    [provider]  warfarin (COUMADIN) 5 MG tablet TAKE AS DIRECTED BY COUMADIN CLINIC 09/17/21   Josue Hector, MD    Physical Exam: Vitals:   06/22/2022 1331 07/01/2022 1345 07/09/2022 1459 07/08/2022 1559  BP:  111/72 109/74 121/61  Pulse:  72 80 74  Resp:  $Remo'18 18 20  'BLfhl$ Temp: 99.4 F (37.4 C)  99.2 F (37.3 C) 98.5 F (36.9 C)  TempSrc: Oral  Oral Oral  SpO2:  100% 100% 99%  Height:        Constitutional: NAD, calm, comfortable.  Currently on room air.  Chronically ill looking and deconditioned. Vitals:   06/13/2022 1331 07/05/2022 1345 06/16/2022 1459 06/13/2022 1559  BP:  111/72 109/74 121/61  Pulse:  72 80 74  Resp:  $Remo'18 18 20  'OCbsT$ Temp: 99.4 F (37.4 C)  99.2 F (37.3 C) 98.5 F (36.9 C)  TempSrc: Oral  Oral Oral  SpO2:  100% 100% 99%  Height:       Eyes: PERRL, lids and conjunctivae normal ENMT: Mucous membranes are dry.  Posterior pharynx clear of any exudate or lesions. Neck: normal, supple, no masses, no thyromegaly Respiratory: bilateral decreased breath sounds at bases, no wheezing, no crackles. Normal respiratory effort. No accessory muscle use.  Cardiovascular: S1 S2 positive, rate controlled. No extremity edema. 2+ pedal pulses.  Abdomen: no tenderness, no masses palpated. No hepatosplenomegaly. Bowel sounds positive.  Musculoskeletal: no clubbing / cyanosis. No joint deformity upper and lower extremities.  Skin: no rashes, lesions, ulcers. No induration Neurologic: Slow to respond.  Poor historian.  Answers some questions appropriately.  CN 2-12 grossly intact. Moving extremities. No  focal neurologic deficits.  Psychiatric: Affect is extremely flat.  No signs of agitation.   Labs on Admission: I have personally reviewed following labs and imaging studies  CBC: Recent Labs  Lab 06/18/22 0824 06/19/22 0742 06/20/22 0352 07/08/2022 1013  WBC 5.2 3.0* 1.9* 3.0*  NEUTROABS 2.5 0.9*  --  0.7*  HGB 9.8* 10.5* 9.6* 11.7*  HCT 30.0* 31.6* 30.0* 34.6*  MCV 96.5 94.6 96.5 93.5   PLT 42* 60* 62* 84*   Basic Metabolic Panel: Recent Labs  Lab 06/18/22 0824 06/19/22 0742 06/20/22 0352 06/19/2022 1013  NA 142 142 141 134*  K 3.5 3.3* 3.9 4.6  CL 115* 114* 114* 103  CO2 $Re'22 23 22 23  'JoQ$ GLUCOSE 96 99 104* 94  BUN $Re'14 11 12 17  'XXI$ CREATININE 1.24 1.04 1.05 1.35*  CALCIUM 8.2* 8.5* 8.6* 9.4  MG 1.8 1.7 1.9 2.0  PHOS 2.2*  --  2.7  --    GFR: Estimated Creatinine Clearance: 48.1 mL/min (A) (by C-G formula based on SCr of 1.35 mg/dL (H)). Liver Function Tests: Recent Labs  Lab 06/18/22 0824 06/19/22 0742 07/01/2022 1013  AST  --  90* 26  ALT  --  72* 37  ALKPHOS  --  132* 144*  BILITOT  --  0.6 0.7  PROT  --  5.7* 6.2*  ALBUMIN 2.6* 3.0* 4.0   No results for input(s): "LIPASE", "AMYLASE" in the last 168 hours. Recent Labs  Lab 06/14/2022 1013  AMMONIA 21   Coagulation Profile: Recent Labs  Lab 06/18/22 0824 06/19/22 0742 06/20/22 0352 07/10/2022 1013  INR 2.7* 2.9* 3.1* 2.1*   Cardiac Enzymes: Recent Labs  Lab 06/18/22 0824  CKTOTAL 29*   BNP (last 3 results) No results for input(s): "PROBNP" in the last 8760 hours. HbA1C: No results for input(s): "HGBA1C" in the last 72 hours. CBG: No results for input(s): "GLUCAP" in the last 168 hours. Lipid Profile: No results for input(s): "CHOL", "HDL", "LDLCALC", "TRIG", "CHOLHDL", "LDLDIRECT" in the last 72 hours. Thyroid Function Tests: No results for input(s): "TSH", "T4TOTAL", "FREET4", "T3FREE", "THYROIDAB" in the last 72 hours. Anemia Panel: No results for input(s): "VITAMINB12", "FOLATE", "FERRITIN", "TIBC", "IRON", "RETICCTPCT" in the last 72 hours. Urine analysis:    Component Value Date/Time   COLORURINE YELLOW 06/28/2022 1310   APPEARANCEUR CLEAR 07/09/2022 1310   LABSPEC 1.015 06/21/2022 1310   PHURINE 7.0 06/23/2022 1310   GLUCOSEU NEGATIVE 06/12/2022 1310   GLUCOSEU NEGATIVE 08/11/2017 0843   HGBUR NEGATIVE 07/07/2022 1310   HGBUR negative 03/20/2010 0843   BILIRUBINUR NEGATIVE  06/18/2022 1310   BILIRUBINUR n 03/22/2011 0000   KETONESUR NEGATIVE 06/30/2022 1310   PROTEINUR 30 (A) 06/25/2022 1310   UROBILINOGEN 0.2 08/11/2017 0843   NITRITE NEGATIVE 06/12/2022 1310   LEUKOCYTESUR NEGATIVE 06/29/2022 1310    Radiological Exams on Admission: CT Head Wo Contrast  Result Date: 06/13/2022 CLINICAL DATA:  Mental status change, unknown cause on coumadin, here for LOC - evaluation for ICH EXAM: CT HEAD WITHOUT CONTRAST TECHNIQUE: Contiguous axial images were obtained from the base of the skull through the vertex without intravenous contrast. RADIATION DOSE REDUCTION: This exam was performed according to the departmental dose-optimization program which includes automated exposure control, adjustment of the mA and/or kV according to patient size and/or use of iterative reconstruction technique. COMPARISON:  CT head 06/13/2022. FINDINGS: Brain: No evidence of acute infarction, hemorrhage, hydrocephalus, extra-axial collection or mass lesion/mass effect. Chronic microvascular ischemic disease and cerebral atrophy. Vascular: No hyperdense vessel.  Skull: No acute fracture. Sinuses/Orbits: Left sphenoid no evidence otherwise, visualized sinuses are clear. No acute orbital findings. Other: No mastoid effusions. IMPRESSION: Of acute intracranial abnormality. Electronically Signed   By: Margaretha Sheffield M.D.   On: 06/23/2022 10:40   DG Chest Portable 1 View  Result Date: 07/01/2022 CLINICAL DATA:  Syncope EXAM: PORTABLE CHEST 1 VIEW COMPARISON:  06/13/2022 FINDINGS: The heart size and mediastinal contours are within normal limits for technique. Both lungs are clear. No pleural effusion or pneumothorax. The visualized skeletal structures are unremarkable. IMPRESSION: No active disease. Electronically Signed   By: Macy Mis M.D.   On: 06/20/2022 10:27     Assessment/Plan  Acute encephalopathy: Acute metabolic encephalopathy versus acute toxic encephalopathy Possible  syncope -Unclear what happened.  Patient was found passed out in the parking lot today and mental status improved after intranasal Narcan administration.  CT of the head negative for acute abnormality. -Patient more awake currently, oriented to time.  Does not remember what happened.  Slow to respond and not a reliable historian currently.  Recently had similar hospitalization with altered mental status and?  Seizure with extensive work-up including MRI of brain and EEG negative and neurology thought that symptoms were because of orthostatic hypotension and patient was placed on midodrine. -Unclear if patient had syncopal episode today as well from orthostasis. -Monitor mental status.  Fall precautions.  PT eval.  Avoid narcotics and sedatives.  Acute injury --creatinine 1.35 today, was 1.05 on 1823.  Continue IV fluids.  Repeat a.m. labs  Hyponatremia -Mild.  IV fluids.  Repeat a.m. labs  Severe aortic stenosis with bioprosthetic AVR -Had recent TTE.  Cardiology recommended no further inpatient evaluation during recent hospitalization and recommended outpatient follow-up for valve in valve TAVR consideration  Orthostatic hypotension -Had extensive work-up during recent hospitalization.  Continue midodrine.  History of CLL Pancytopenia -Outpatient follow-up with oncology.  Had appointment today but could not make it.  Will add [name] to care teams. -Monitor CBC.    History of pulmonary embolism Paroxysmal A-fib -Continue Coumadin, dosed as per pharmacy.  Monitor INR.  Rate controlled.  Currently not on any rate controlling medications.  Hyperlipidemia -Continue rosuvastatin  Chronic diastolic CHF -Recent TTE showed LVEF of 50% with grade 2 diastolic dysfunction and severe aortic stenosis -Currently compensated.  Strict input and output.  Daily weights.  Not on diuretics at home  Physical deconditioning -PT eval.  Patient was recently discharged to SNF.  TOC eval  Goals of  care -Palliative care consultation for goals of care.  Currently listed as full code.  DVT prophylaxis:  Coumadin Code Status: Full Family Communication: None at bedside Disposition Plan: Home in 1 to 2 days once clinically improved Consults called: Palliative care Admission status: Observation  Severity of Illness: The appropriate patient status for this patient is OBSERVATION. Observation status is judged to be reasonable and necessary in order to provide the required intensity of service to ensure the patient's safety. The patient's presenting symptoms, physical exam findings, and initial radiographic and laboratory data in the context of their medical condition is felt to place them at decreased risk for further clinical deterioration. Furthermore, it is anticipated that the patient will be medically stable for discharge from the hospital within 2 midnights of admission.     Aline August MD [facility]  07/11/2022, 4:31 PM

## 2022-06-24 NOTE — ED Notes (Signed)
RT responded to emergency in waiting room. Appeared that patient was not breathing. Upon arrival to Room 14, placed on monitor and VS WNL. Patient color returned and breathing well on his own. RT to monitor as needed

## 2022-06-24 NOTE — ED Notes (Signed)
Currently on chemotherapy, was going to oncology for chemo today. Family states pt was recently admitted, now in [place]. States has been getting hypotensive recently

## 2022-06-24 NOTE — ED Notes (Signed)
Handoff report given to [facility].

## 2022-06-24 NOTE — Progress Notes (Signed)
ANTICOAGULATION CONSULT NOTE - Initial Consult  Pharmacy Consult for warfarin Indication: atrial fibrillation, history of PE  Allergies  Allergen Reactions   Hydrocodone-Acetaminophen Shortness Of Breath   Oxycodone Hcl Shortness Of Breath   Telmisartan-Hctz Shortness Of Breath, Palpitations and Other (See Comments)    Dizziness also   Aspirin Rash and Other (See Comments)    Confusion, also- Cannot take high doses   Atorvastatin Other (See Comments)    Myalgia    Buspirone Hcl Other (See Comments)    Headaches    Codeine Hives, Nausea And Vomiting and Other (See Comments)    Confusion also   Morphine Sulfate Itching   Paroxetine Other (See Comments)    Paresthesias: R arm, R leg, r side of face   Rabeprazole Other (See Comments)    headache   Ramipril Other (See Comments)    Unknown reaction    Patient Measurements: Height: '5\' 10"'$  (177.8 cm) Weight: 77.3 kg (170 lb 6.7 oz) IBW/kg (Calculated) : 73  Vital Signs: Temp: 98.5 F (36.9 C) (08/14 1559) Temp Source: Oral (08/14 1559) BP: 121/61 (08/14 1559) Pulse Rate: 74 (08/14 1559)  Labs: Recent Labs    06/27/2022 1013  HGB 11.7*  HCT 34.6*  PLT 84*  LABPROT 23.0*  INR 2.1*  CREATININE 1.35*  TROPONINIHS 20*    Estimated Creatinine Clearance: 48.1 mL/min (A) (by C-G formula based on SCr of 1.35 mg/dL (H)).   Medical History: Past Medical History:  Diagnosis Date   Anticoagulants causing adverse effect in therapeutic use    Anxiety    Paxil seemed to cause odd neuro side effects; better on prozac as of 09/2015   Aortic regurgitation 04/2007 surgery   Resolved [facility]R at [facility]   Cervical spondylosis    ESI by [name] in [place]. 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 (most recent 07/2017)   History of prostate cancer 2003   Rad prost   History of pulmonary embolism 2011; 10/2014   Recurrence off of  anticoagulation 10/2014: per hem/onc ([name]) pt needs lifelong anticoagulation (hypercoag w/u neg).   Hyperlipidemia    statin-intolerant, except for crestor low dose.   Hypertension    Migraine syndrome    Mitral regurgitation    PAF (paroxysmal atrial fibrillation) (HCC)    Panic attacks    "           "             "                  "                  "                       "                             "                          Pulmonary nodule, right 2015   RLL: resected at [facility] --VATS; Benign RLL nodule (fungal and AFB stains neg).  Plan is for [facility] thoracic surg to do f/u o/v & CT chest 1 yr      Assessment: 76 year old male  with CLL undergoing chemotherapy. PMH significant for bioprosthetic AVR, recurrent PE, PAF on warfarin PTA. Per notes from previous hospitalization and med reconciliation completed with niece, it is unclear if patient taking his warfarin consistently. Last anticoagulation visit in chart 7/7 with therapeutic INR and dose 10 mg all days except 15 mg on Sunday and Thursday.  INR 2.1 on admission - slightly subtherapeutic given goal on upper end of range as below per Cardiology. Platelets consistent with baseline (~ 80s) and improved from recent (30s).  Goal of Therapy:  INR 2.5-3.0 Monitor platelets by anticoagulation protocol: Yes   Plan:  -Given INR below goal, will give warfarin 15 mg today (1.5x home dose) -Daily INR -Monitor for signs/symptoms of bleeding   Tawnya Crook, PharmD, BCPS Clinical Pharmacist 07/05/2022 6:42 PM

## 2022-06-24 NOTE — ED Notes (Signed)
Immediately called to lobby, pt found in wc, appeared apneic, immediately placed in room 14, ED MD at bedside, on cardiac monitor, IV established, Lab Tubes obtained, Narcan Intranasal administered per ED MD verbal orders.

## 2022-06-24 NOTE — ED Provider Notes (Signed)
[facility] EMERGENCY DEPARTMENT Provider Note   CSN: [id number] Arrival date & time: 07/11/2022  0086     History  Chief Complaint  Patient presents with   Weakness    [name] is a 76 y.o. male presenting to the emergency department as a "CODE BLUE status" as the patient was found in the parking lot unresponsive at [facility].  He was reportedly here for an oncology outpatient office appointment.  However the staff is unable to arouse him in the parking lot and were not able to discern carotid pulses.  The patient was immediately brought into the emergency department and taken to the resuscitation room.  He had pulses present on arrival.  He was groggy, with eyes closed, but able to answer to his name and open his eyes.  He is not able to provide further history, cannot tell me why he feels so "bad".  He denies chest pain.  He reports he takes "a lot of pills".  Medical chart review shows the patient was just discharged from the hospital 4 days ago after being admitted for a week for altered mental status.  He has a complicated medical history which includes CLL, bioprosthetic AVR, recurrent PE, on Coumadin, paroxysmal A-fib, chronic kidney disease.  While in the hospital there was some thought that he has had progressive cognitive decline with a family member reporting that his cognition seems to be about what it was several months ago.  He had CT and MRI of the brain with no acute findings.  Ammonia B12 and TSH levels were normal.  He underwent EEG which just had moderate diffuse encephalopathy but no epileptiform signature.  He was noted to have orthostatic hypotension in the hospital, he was noted to have underlying hypotension, and is prescribed midodrine 3 times a day for this.  His blood cultures were no growth to date.  HPI     Home Medications Prior to Admission medications   Medication Sig Start Date End Date Taking? Authorizing Provider  acyclovir  (ZOVIRAX) 400 MG tablet TAKE 1 TABLET BY MOUTH EVERY DAY Patient taking differently: Take 400 mg by mouth daily. 04/30/22   Volanda Napoleon, MD  Cholecalciferol (VITAMIN D3) 50 MCG (2000 UT) capsule Take 2,000 Units by mouth every morning.    [provider]  [employer] MOTION SICKNESS RELIEF 25 MG CHEW CHEW 1 TABLET EVERY DAY AS NEEDED FOR DIZZINESS 01/18/22   [name], MD  docusate sodium (COLACE) 100 MG capsule Take 100 mg by mouth daily.    [provider]  ipratropium (ATROVENT) 0.03 % nasal spray Place 2 sprays into both nostrils every 12 (twelve) hours. Patient taking differently: Place 2 sprays into both nostrils 2 (two) times daily as needed for rhinitis (congestion). 10/04/20   Inda Coke, PA  LORazepam (ATIVAN) 0.5 MG tablet Take 1 tablet (0.5 mg total) by mouth at bedtime as needed for up to 5 days for anxiety (can take 1/4 or 1/2 tablet  if effective.). 06/20/22 06/25/22  Shawna Clamp, MD  megestrol (MEGACE) 400 MG/10ML suspension Take 10 mLs (400 mg total) by mouth 2 (two) times daily. 06/20/22   Shawna Clamp, MD  midodrine (PROAMATINE) 2.5 MG tablet Take 1 tablet (2.5 mg total) by mouth 3 (three) times daily with meals. 06/20/22   Shawna Clamp, MD  omeprazole (PRILOSEC) 40 MG capsule Take 40 mg by mouth every evening. 10/18/20   [provider]  ondansetron (ZOFRAN) 8 MG tablet  Take 1 tablet (8 mg total) by mouth 2 (two) times daily as needed for refractory nausea / vomiting. Start on day 2 after bendamustine chemo. 01/03/22   Volanda Napoleon, MD  prochlorperazine (COMPAZINE) 10 MG tablet Take 1 tablet (10 mg total) by mouth every 6 (six) hours as needed (Nausea or vomiting). 01/03/22   Volanda Napoleon, MD  rosuvastatin (CRESTOR) 5 MG tablet Take 5 mg by mouth daily.    [provider]  sertraline (ZOLOFT) 100 MG tablet Take 100 mg by mouth every morning.    [provider]  sodium chloride (OCEAN) 0.65 % SOLN nasal spray Place 1  spray into both nostrils as needed for congestion.    [provider]  warfarin (COUMADIN) 5 MG tablet TAKE AS DIRECTED BY COUMADIN CLINIC 09/17/21   Josue Hector, MD      Allergies    Hydrocodone-acetaminophen, Oxycodone hcl, Telmisartan-hctz, Aspirin, Atorvastatin, Buspirone hcl, Codeine, Morphine sulfate, Paroxetine, Rabeprazole, and Ramipril    Review of Systems   Review of Systems  Physical Exam Updated Vital Signs BP 109/74 (BP Location: Left Arm)   Pulse 80   Temp 99.2 F (37.3 C) (Oral)   Resp 18   Ht '5\' 10"'$  (1.778 m)   SpO2 100%   BMI 25.84 kg/m  Physical Exam Constitutional:      Comments: Appears groggy, eyes closed  HENT:     Head: Normocephalic.     Comments: Pinpoint pupils bilaterally Eyes:     Conjunctiva/sclera: Conjunctivae normal.     Pupils: Pupils are equal, round, and reactive to light.  Cardiovascular:     Rate and Rhythm: Normal rate and regular rhythm.  Pulmonary:     Effort: Pulmonary effort is normal. No respiratory distress.  Abdominal:     General: There is no distension.     Tenderness: There is no abdominal tenderness.  Skin:    General: Skin is warm and dry.  Neurological:     Mental Status: He is alert.     GCS: GCS eye subscore is 3. GCS verbal subscore is 4. GCS motor subscore is 6.     ED Results / Procedures / Treatments   Labs (all labs ordered are listed, but only abnormal results are displayed) Labs Reviewed  BASIC METABOLIC PANEL - Abnormal; Notable for the following components:      Result Value   Sodium 134 (*)    Creatinine, Ser 1.35 (*)    GFR, Estimated 54 (*)    All other components within normal limits  CBC WITH DIFFERENTIAL/PLATELET - Abnormal; Notable for the following components:   WBC 3.0 (*)    RBC 3.70 (*)    Hemoglobin 11.7 (*)    HCT 34.6 (*)    Platelets 84 (*)    Neutro Abs 0.7 (*)    All other components within normal limits  HEPATIC FUNCTION PANEL - Abnormal; Notable for the following  components:   Total Protein 6.2 (*)    Alkaline Phosphatase 144 (*)    Bilirubin, Direct 0.3 (*)    All other components within normal limits  URINALYSIS, ROUTINE W REFLEX MICROSCOPIC - Abnormal; Notable for the following components:   Protein, ur 30 (*)    All other components within normal limits  PROTIME-INR - Abnormal; Notable for the following components:   Prothrombin Time 23.0 (*)    INR 2.1 (*)    All other components within normal limits  URINALYSIS, MICROSCOPIC (REFLEX) - Abnormal; Notable  for the following components:   Bacteria, UA RARE (*)    All other components within normal limits  TROPONIN I (HIGH SENSITIVITY) - Abnormal; Notable for the following components:   Troponin I (High Sensitivity) 20 (*)    All other components within normal limits  SARS CORONAVIRUS 2 BY RT PCR  CULTURE, BLOOD (ROUTINE X 2)  CULTURE, BLOOD (ROUTINE X 2)  AMMONIA  MAGNESIUM  ETHANOL  RAPID URINE DRUG SCREEN, HOSP PERFORMED  PATHOLOGIST SMEAR REVIEW    EKG EKG Interpretation  Date/Time:  Monday June 24 2022 10:04:19 EDT Ventricular Rate:  77 PR Interval:  180 QRS Duration: 138 QT Interval:  425 QTC Calculation: 481 R Axis:   -77 Text Interpretation: Sinus rhythm Atrial premature complexes in couplets Right bundle branch block LVH with IVCD and secondary repol abnrm Borderline prolonged QT interval Confirmed by Octaviano Glow [phone]) on 07/01/2022 10:18:01 AM  Radiology CT Head Wo Contrast  Result Date: 06/20/2022 CLINICAL DATA:  Mental status change, unknown cause on coumadin, here for LOC - evaluation for ICH EXAM: CT HEAD WITHOUT CONTRAST TECHNIQUE: Contiguous axial images were obtained from the base of the skull through the vertex without intravenous contrast. RADIATION DOSE REDUCTION: This exam was performed according to the departmental dose-optimization program which includes automated exposure control, adjustment of the mA and/or kV according to patient size and/or use of  iterative reconstruction technique. COMPARISON:  CT head 06/13/2022. FINDINGS: Brain: No evidence of acute infarction, hemorrhage, hydrocephalus, extra-axial collection or mass lesion/mass effect. Chronic microvascular ischemic disease and cerebral atrophy. Vascular: No hyperdense vessel. Skull: No acute fracture. Sinuses/Orbits: Left sphenoid no evidence otherwise, visualized sinuses are clear. No acute orbital findings. Other: No mastoid effusions. IMPRESSION: Of acute intracranial abnormality. Electronically Signed   By: Margaretha Sheffield M.D.   On: 06/11/2022 10:40   DG Chest Portable 1 View  Result Date: 06/26/2022 CLINICAL DATA:  Syncope EXAM: PORTABLE CHEST 1 VIEW COMPARISON:  06/13/2022 FINDINGS: The heart size and mediastinal contours are within normal limits for technique. Both lungs are clear. No pleural effusion or pneumothorax. The visualized skeletal structures are unremarkable. IMPRESSION: No active disease. Electronically Signed   By: Macy Mis M.D.   On: 07/06/2022 10:27    Procedures Procedures    Medications Ordered in ED Medications  naloxone (NARCAN) nasal spray 4 mg/0.1 mL (has no administration in time range)  acyclovir (ZOVIRAX) tablet 400 mg (has no administration in time range)  Vitamin D3 2,000 Units (has no administration in time range)  docusate sodium (COLACE) capsule 100 mg (has no administration in time range)  megestrol (MEGACE) 400 MG/10ML suspension 400 mg (has no administration in time range)  midodrine (PROAMATINE) tablet 2.5 mg (has no administration in time range)  rosuvastatin (CRESTOR) tablet 5 mg (has no administration in time range)  sertraline (ZOLOFT) tablet 100 mg (has no administration in time range)  Warfarin - Pharmacist Dosing Inpatient (has no administration in time range)  sodium chloride 0.9 % bolus 1,000 mL (0 mLs Intravenous Stopped 06/23/2022 1123)    ED Course/ Medical Decision Making/ A&P Clinical Course as of 07/04/2022 1521  Mon  Jun 24, 2022  1018 Patient appears to have had some improvement of cognition and mental status after Narcan administered intranasal. [MT]  1251 Patient still remains extremely somnolent, appears confused, encephalopathic.  He denies headache.  Given his level of confusion he will need admission back to the hospital.  He may require SNF placement. [MT]  1252 He has the  same pancytopenia noted during hospitalization stay recently. [MT]  7342 Vital signs of remained unremarkable.  I think is reasonable to draw blood cultures but will hold off on antibiotics at this time unless a clear source of infection emerges.  The patient's blood cultures on his very recent hospitalization were negative, and I do not see evidence of sepsis or severe sepsis. [MT]  1352 Admitted to dr Olevia Bowens hospitalist for pcu bed at [facility] [MT]  1358 patient's home medications reordered.  Holding Ativan for now given concern for delirium or encephalopathy.  Coumadin per pharmacy consult - unclear how he's dosing it at home [MT]    Clinical Course User Index [MT] Micaila Ziemba, Carola Rhine, MD                           Medical Decision Making Amount and/or Complexity of Data Reviewed Labs: ordered. Radiology: ordered. ECG/medicine tests: ordered.  Risk OTC drugs. Prescription drug management. Decision regarding hospitalization.   This patient presents to the Emergency Department with complaint of altered mental status.  This involves an extensive number of treatment options, and is a complaint that carries with it a high risk of complications and morbidity.  The differential diagnosis includes hypoglycemia vs metabolic encephalopathy vs infection (including cystitis) vs ICH vs stroke vs polypharmacy vs other  I ordered, reviewed, and interpreted labs, including no significant abnormalities, base line levels for all of blood test compared to hospitalization 1 week ago.  Pancytopenia continues.  COVID is negative. I ordered medication the  fluid bolus for hypotension, Narcan initially attempted for possible opioid ingestion or overdose, although subsequent work-up does not seem consistent with opioid use. I ordered imaging studies which included x-ray of the chest, CT of the head I independently visualized and interpreted imaging which showed no emergent findings and the monitor tracing which showed regular heart rate External  records obtained and reviewed showing hospital course and discharge summary and testing done, including blood cultures, as noted in history above I personally reviewed the patients ECG which showed sinus rhythm with no acute ischemic findings  After the interventions stated above, I reevaluated the patient and found he remained confused, quite lethargic, plan for repeat admission to the hospital for encephalopathy.  UA and UDS reviewed, no clear evidence of infection or polysubstance use.         Final Clinical Impression(s) / ED Diagnoses Final diagnoses:  Unresponsiveness  Encephalopathy    Rx / DC Orders ED Discharge Orders     None         Geordan Xu, Carola Rhine, MD 07/07/2022 [phone]

## 2022-06-24 NOTE — Progress Notes (Signed)
Plan of Care Note for accepted transfer   Patient: [name] MRN: [id number]   DOA: 07/06/2022  Facility requesting transfer: [facility].  Requesting Provider: Octaviano Glow, MD Reason for transfer: Unresponsiveness.  Facility course:  Per EDP: " Patient presents with   Weakness      [name] is a 76 y.o. male presenting to the emergency department as a "CODE BLUE status" as the patient was found in the parking lot unresponsive at [facility].  He was reportedly here for an oncology outpatient office appointment.  However the staff is unable to arouse him in the parking lot and were not able to discern carotid pulses.  The patient was immediately brought into the emergency department and taken to the resuscitation room.  He had pulses present on arrival.  He was groggy, with eyes closed, but able to answer to his name and open his eyes.  He is not able to provide further history, cannot tell me why he feels so "bad".  He denies chest pain.  He reports he takes "a lot of pills".   Medical chart review shows the patient was just discharged from the hospital 4 days ago after being admitted for a week for altered mental status.  He has a complicated medical history which includes CLL, bioprosthetic AVR, recurrent PE, on Coumadin, paroxysmal A-fib, chronic kidney disease.  While in the hospital there was some thought that he has had progressive cognitive decline with a family member reporting that his cognition seems to be about what it was several months ago.  He had CT and MRI of the brain with no acute findings.  Ammonia B12 and TSH levels were normal.  He underwent EEG which just had moderate diffuse encephalopathy but no epileptiform signature.  He was noted to have orthostatic hypotension in the hospital, he was noted to have underlying hypotension, and is prescribed midodrine 3 times a day for this.   His blood cultures were no growth to date."   Blood culture  (routine x 2) [[id number]]   Collected: 06/16/2022 1406   Updated: 06/16/2022 1410   Specimen Type: Blood   Rapid urine drug screen (hospital performed) [[id number]]   Collected: 06/17/2022 1310   Updated: 07/07/2022 1338   Specimen Source: Urine, Catheterized    Opiates NONE DETECTED   Cocaine NONE DETECTED   Benzodiazepines NONE DETECTED   Amphetamines NONE DETECTED   Tetrahydrocannabinol NONE DETECTED   Barbiturates NONE DETECTED  Urinalysis, Microscopic (reflex) [[id number]] (Abnormal)   Collected: 06/15/2022 1310   Updated: 06/11/2022 1328    RBC / HPF 0-5 RBC/hpf   WBC, UA 0-5 WBC/hpf   Bacteria, UA RARE Abnormal    Squamous Epithelial / LPF 0-5   Mucus PRESENT   Hyaline Casts, UA PRESENT  Urinalysis, Routine w reflex microscopic Urine, Catheterized [[id number]] (Abnormal)   Collected: 06/25/2022 1310   Updated: 07/01/2022 1328   Specimen Source: Urine, Catheterized    Color, Urine YELLOW   APPearance CLEAR   Specific Gravity, Urine 1.015   pH 7.0   Glucose, UA NEGATIVE mg/dL   Hgb urine dipstick NEGATIVE   Bilirubin Urine NEGATIVE   Ketones, ur NEGATIVE mg/dL   Protein, ur 30 Abnormal  mg/dL   Nitrite NEGATIVE   Leukocytes,Ua NEGATIVE  Hepatic function panel [[id number]] (Abnormal)   Collected: 06/30/2022 1013   Updated: 06/27/2022 1156   Specimen Type: Blood    Total Protein 6.2 Low  g/dL   Albumin 4.0  g/dL   AST 26 U/L   ALT 37 U/L   Alkaline Phosphatase 144 High  U/L   Total Bilirubin 0.7 mg/dL   Bilirubin, Direct 0.3 High  mg/dL   Indirect Bilirubin 0.4 mg/dL  Magnesium [[id number]]   Collected: 06/15/2022 1013   Updated: 06/23/2022 1156   Specimen Type: Blood    Magnesium 2.0 mg/dL  CBC with Differential [[id number]] (Abnormal)   Collected: 06/21/2022 1013   Updated: 06/19/2022 1146   Specimen Type: Blood    WBC 3.0 Low  K/uL   RBC 3.70 Low  MIL/uL   Hemoglobin 11.7 Low  g/dL   HCT 34.6 Low  %   MCV 93.5 fL   MCH 31.6 pg   MCHC 33.8 g/dL   RDW 14.3 %   Platelets 84 Low   K/uL   nRBC 0.0 %   Neutrophils Relative % 23 %   Neutro Abs 0.7 Low  K/uL   Lymphocytes Relative 66 %   Lymphs Abs 2.0 K/uL   Monocytes Relative 9 %   Monocytes Absolute 0.3 K/uL   Eosinophils Relative 0 %   Eosinophils Absolute 0.0 K/uL   Basophils Relative 1 %   Basophils Absolute 0.0 K/uL   WBC Morphology MORPHOLOGY UNREMARKABLE   RBC Morphology MORPHOLOGY UNREMARKABLE   Smear Review MORPHOLOGY UNREMARKABLE   Immature Granulocytes 1 %   Abs Immature Granulocytes 0.04 K/uL  Pathologist smear review [[id number]]   Collected: 06/21/2022 1013   Updated: 06/14/2022 1144   SARS Coronavirus 2 by RT PCR (hospital order, performed in [place] hospital lab) *cepheid single result test* Anterior Nasal Swab [[id number]]   Collected: 07/01/2022 1023   Updated: 07/08/2022 1106   Specimen Source: Anterior Nasal Swab    SARS Coronavirus 2 by RT PCR NEGATIVE  Basic metabolic panel [[id number]] (Abnormal)   Collected: 06/28/2022 1013   Updated: 07/06/2022 1103   Specimen Type: Blood    Sodium 134 Low  mmol/L   Potassium 4.6 mmol/L   Chloride 103 mmol/L   CO2 23 mmol/L   Glucose, Bld 94 mg/dL   BUN 17 mg/dL   Creatinine, Ser 1.35 High  mg/dL   Calcium 9.4 mg/dL   GFR, Estimated 54 Low  mL/min   Anion gap 8  Ethanol [[id number]]   Collected: 06/15/2022 1013   Updated: 07/01/2022 1102   Specimen Type: Blood    Alcohol, Ethyl (B) <10 mg/dL  Troponin I (High Sensitivity) [[id number]] (Abnormal)   Collected: 06/27/2022 1013   Updated: 06/23/2022 1100    Troponin I (High Sensitivity) 20 High  ng/L  Ammonia [[id number]]   Collected: 06/15/2022 1013   Updated: 07/05/2022 1057   Specimen Type: Blood    Ammonia 21 umol/L  Protime-INR [[id number]] (Abnormal)   Collected: 07/08/2022 1013   Updated: 06/19/2022 1053   Specimen Type: Blood    Prothrombin Time 23.0 High  seconds   INR 2.1 High    Imaging: Portable 1 view chest radiograph with no active disease.CT head without contrast showed chronic microvascular  ischemic disease and cerebral atrophy, but no acute intracranial normality.  Plan of care: The patient is accepted for admission to Progressive unit, at [facility].  He may need SNF placement on discharge.  Author: Reubin Milan, MD 07/04/2022  Check [email or web address] for on-call coverage.  Nursing staff, Please call [place] number on Amion as soon as patient's arrival, so appropriate admitting provider can evaluate the pt.

## 2022-06-24 NOTE — ED Notes (Signed)
C/o nausea, febrile of 101 tympanic

## 2022-06-24 NOTE — ED Notes (Signed)
Patient is resting comfortably. Niece is in the room with him and states he is just "more weak" than normal. He is alert to voice and is able to answer questions correctly and in a decent times manner. His neuro exam was unremarkable with no obvious deficits.

## 2022-06-24 NOTE — ED Triage Notes (Signed)
Pt was in car heading to oncology appointment per oncology nurse, was heading upstairs when patient became unresponsive. Difficulty finding carotid pulse in lobby, brought to rm 14, Dr. Langston Masker at bedside. Patient coming back to consciousness upon arrival to room. '4mg'$  narcan administered IN.

## 2022-06-25 ENCOUNTER — Inpatient Hospital Stay (HOSPITAL_COMMUNITY): Payer: No Typology Code available for payment source

## 2022-06-25 ENCOUNTER — Encounter (HOSPITAL_COMMUNITY): Payer: Self-pay | Admitting: Hematology & Oncology

## 2022-06-25 DIAGNOSIS — G928 Other toxic encephalopathy: Secondary | ICD-10-CM | POA: Diagnosis present

## 2022-06-25 DIAGNOSIS — Z86711 Personal history of pulmonary embolism: Secondary | ICD-10-CM | POA: Diagnosis not present

## 2022-06-25 DIAGNOSIS — D693 Immune thrombocytopenic purpura: Secondary | ICD-10-CM | POA: Diagnosis present

## 2022-06-25 DIAGNOSIS — N1832 Chronic kidney disease, stage 3b: Secondary | ICD-10-CM | POA: Diagnosis present

## 2022-06-25 DIAGNOSIS — Z7901 Long term (current) use of anticoagulants: Secondary | ICD-10-CM | POA: Diagnosis not present

## 2022-06-25 DIAGNOSIS — I350 Nonrheumatic aortic (valve) stenosis: Secondary | ICD-10-CM

## 2022-06-25 DIAGNOSIS — N179 Acute kidney failure, unspecified: Secondary | ICD-10-CM

## 2022-06-25 DIAGNOSIS — R5081 Fever presenting with conditions classified elsewhere: Secondary | ICD-10-CM | POA: Diagnosis not present

## 2022-06-25 DIAGNOSIS — E785 Hyperlipidemia, unspecified: Secondary | ICD-10-CM | POA: Diagnosis present

## 2022-06-25 DIAGNOSIS — I5032 Chronic diastolic (congestive) heart failure: Secondary | ICD-10-CM

## 2022-06-25 DIAGNOSIS — I2699 Other pulmonary embolism without acute cor pulmonale: Secondary | ICD-10-CM

## 2022-06-25 DIAGNOSIS — C9110 Chronic lymphocytic leukemia of B-cell type not having achieved remission: Secondary | ICD-10-CM

## 2022-06-25 DIAGNOSIS — Z79899 Other long term (current) drug therapy: Secondary | ICD-10-CM | POA: Diagnosis not present

## 2022-06-25 DIAGNOSIS — G9341 Metabolic encephalopathy: Secondary | ICD-10-CM | POA: Diagnosis not present

## 2022-06-25 DIAGNOSIS — G0490 Encephalitis and encephalomyelitis, unspecified: Secondary | ICD-10-CM | POA: Diagnosis present

## 2022-06-25 DIAGNOSIS — Z8546 Personal history of malignant neoplasm of prostate: Secondary | ICD-10-CM | POA: Diagnosis not present

## 2022-06-25 DIAGNOSIS — I951 Orthostatic hypotension: Secondary | ICD-10-CM | POA: Diagnosis not present

## 2022-06-25 DIAGNOSIS — R4182 Altered mental status, unspecified: Secondary | ICD-10-CM | POA: Diagnosis present

## 2022-06-25 DIAGNOSIS — I471 Supraventricular tachycardia: Secondary | ICD-10-CM | POA: Diagnosis not present

## 2022-06-25 DIAGNOSIS — Z7189 Other specified counseling: Secondary | ICD-10-CM | POA: Diagnosis not present

## 2022-06-25 DIAGNOSIS — G9340 Encephalopathy, unspecified: Secondary | ICD-10-CM | POA: Diagnosis not present

## 2022-06-25 DIAGNOSIS — Z515 Encounter for palliative care: Secondary | ICD-10-CM

## 2022-06-25 DIAGNOSIS — Z66 Do not resuscitate: Secondary | ICD-10-CM | POA: Diagnosis present

## 2022-06-25 DIAGNOSIS — I130 Hypertensive heart and chronic kidney disease with heart failure and stage 1 through stage 4 chronic kidney disease, or unspecified chronic kidney disease: Secondary | ICD-10-CM | POA: Diagnosis present

## 2022-06-25 DIAGNOSIS — Z20822 Contact with and (suspected) exposure to covid-19: Secondary | ICD-10-CM | POA: Diagnosis present

## 2022-06-25 DIAGNOSIS — D689 Coagulation defect, unspecified: Secondary | ICD-10-CM | POA: Diagnosis present

## 2022-06-25 DIAGNOSIS — Z87891 Personal history of nicotine dependence: Secondary | ICD-10-CM | POA: Diagnosis not present

## 2022-06-25 DIAGNOSIS — R4189 Other symptoms and signs involving cognitive functions and awareness: Secondary | ICD-10-CM | POA: Diagnosis not present

## 2022-06-25 DIAGNOSIS — Z952 Presence of prosthetic heart valve: Secondary | ICD-10-CM

## 2022-06-25 DIAGNOSIS — D61818 Other pancytopenia: Secondary | ICD-10-CM

## 2022-06-25 DIAGNOSIS — E871 Hypo-osmolality and hyponatremia: Secondary | ICD-10-CM | POA: Diagnosis present

## 2022-06-25 DIAGNOSIS — I700 Atherosclerosis of aorta: Secondary | ICD-10-CM | POA: Diagnosis present

## 2022-06-25 DIAGNOSIS — D709 Neutropenia, unspecified: Secondary | ICD-10-CM | POA: Diagnosis not present

## 2022-06-25 DIAGNOSIS — I480 Paroxysmal atrial fibrillation: Secondary | ICD-10-CM | POA: Diagnosis present

## 2022-06-25 DIAGNOSIS — I5042 Chronic combined systolic (congestive) and diastolic (congestive) heart failure: Secondary | ICD-10-CM | POA: Diagnosis present

## 2022-06-25 DIAGNOSIS — Z953 Presence of xenogenic heart valve: Secondary | ICD-10-CM | POA: Diagnosis not present

## 2022-06-25 LAB — CBC
HCT: 28.1 % — ABNORMAL LOW (ref 39.0–52.0)
Hemoglobin: 9.2 g/dL — ABNORMAL LOW (ref 13.0–17.0)
MCH: 31.6 pg (ref 26.0–34.0)
MCHC: 32.7 g/dL (ref 30.0–36.0)
MCV: 96.6 fL (ref 80.0–100.0)
Platelets: 53 10*3/uL — ABNORMAL LOW (ref 150–400)
RBC: 2.91 MIL/uL — ABNORMAL LOW (ref 4.22–5.81)
RDW: 14.3 % (ref 11.5–15.5)
WBC: 1 10*3/uL — CL (ref 4.0–10.5)
nRBC: 0 % (ref 0.0–0.2)

## 2022-06-25 LAB — COMPREHENSIVE METABOLIC PANEL
ALT: 31 U/L (ref 0–44)
AST: 25 U/L (ref 15–41)
Albumin: 2.9 g/dL — ABNORMAL LOW (ref 3.5–5.0)
Alkaline Phosphatase: 110 U/L (ref 38–126)
Anion gap: 6 (ref 5–15)
BUN: 18 mg/dL (ref 8–23)
CO2: 20 mmol/L — ABNORMAL LOW (ref 22–32)
Calcium: 8 mg/dL — ABNORMAL LOW (ref 8.9–10.3)
Chloride: 111 mmol/L (ref 98–111)
Creatinine, Ser: 1.29 mg/dL — ABNORMAL HIGH (ref 0.61–1.24)
GFR, Estimated: 57 mL/min — ABNORMAL LOW (ref >60)
Glucose, Bld: 95 mg/dL (ref 70–99)
Potassium: 4.6 mmol/L (ref 3.5–5.1)
Sodium: 137 mmol/L (ref 135–145)
Total Bilirubin: 0.5 mg/dL (ref 0.3–1.2)
Total Protein: 5.5 g/dL — ABNORMAL LOW (ref 6.5–8.1)

## 2022-06-25 LAB — RAPID URINE DRUG SCREEN, HOSP PERFORMED
Amphetamines: NOT DETECTED
Barbiturates: NOT DETECTED
Benzodiazepines: NOT DETECTED
Cocaine: NOT DETECTED
Opiates: NOT DETECTED
Tetrahydrocannabinol: NOT DETECTED

## 2022-06-25 LAB — HEPARIN LEVEL (UNFRACTIONATED): Heparin Unfractionated: 0.25 IU/mL — ABNORMAL LOW (ref 0.30–0.70)

## 2022-06-25 LAB — PROTIME-INR
INR: 2.2 — ABNORMAL HIGH (ref 0.8–1.2)
Prothrombin Time: 24.1 seconds — ABNORMAL HIGH (ref 11.4–15.2)

## 2022-06-25 LAB — CBG MONITORING, ED: Glucose-Capillary: 92 mg/dL (ref 70–99)

## 2022-06-25 LAB — MAGNESIUM: Magnesium: 1.9 mg/dL (ref 1.7–2.4)

## 2022-06-25 MED ORDER — THIAMINE HCL 100 MG/ML IJ SOLN: 100.0000 mg | Freq: Every day | INTRAMUSCULAR | Status: DC

## 2022-06-25 MED ORDER — HEPARIN (PORCINE) 25000 UT/250ML-% IV SOLN
1350.0000 [IU]/h | INTRAVENOUS | Status: DC
  Administered 2022-06-25: 1100 [IU]/h via INTRAVENOUS
  Administered 2022-06-26: 1350 [IU]/h via INTRAVENOUS
  Filled 2022-06-25 (×2): qty 250

## 2022-06-25 MED ORDER — SODIUM CHLORIDE 0.9 % IV BOLUS
500.0000 mL | Freq: Once | INTRAVENOUS | Status: AC
  Administered 2022-06-25: 500 mL via INTRAVENOUS

## 2022-06-25 MED ORDER — THIAMINE HCL 100 MG/ML IJ SOLN
250.0000 mg | Freq: Every day | INTRAVENOUS | Status: DC
  Administered 2022-06-27 – 2022-06-28 (×2): 250 mg via INTRAVENOUS
  Filled 2022-06-25 (×2): qty 2.5

## 2022-06-25 MED ORDER — ADULT MULTIVITAMIN W/MINERALS CH
1.0000 | ORAL_TABLET | Freq: Every day | ORAL | Status: DC
  Administered 2022-06-25 – 2022-06-27 (×3): 1 via ORAL
  Filled 2022-06-25 (×3): qty 1

## 2022-06-25 MED ORDER — TBO-FILGRASTIM 480 MCG/0.8ML ~~LOC~~ SOSY
480.0000 ug | PREFILLED_SYRINGE | Freq: Once | SUBCUTANEOUS | Status: AC
  Administered 2022-06-25: 480 ug via SUBCUTANEOUS
  Filled 2022-06-25: qty 0.8

## 2022-06-25 MED ORDER — SODIUM CHLORIDE 0.9 % IV SOLN
1.0000 g | Freq: Three times a day (TID) | INTRAVENOUS | Status: DC
  Administered 2022-06-25 – 2022-06-26 (×4): 1 g via INTRAVENOUS
  Filled 2022-06-25 (×4): qty 20

## 2022-06-25 MED ORDER — ROMIPLOSTIM 125 MCG ~~LOC~~ SOLR
1.0000 ug/kg | Freq: Once | SUBCUTANEOUS | Status: AC
  Administered 2022-06-25: 75 ug via SUBCUTANEOUS
  Filled 2022-06-25: qty 0.15

## 2022-06-25 MED ORDER — THIAMINE HCL 100 MG/ML IJ SOLN
500.0000 mg | Freq: Three times a day (TID) | INTRAVENOUS | Status: DC
  Administered 2022-06-25 – 2022-06-27 (×5): 500 mg via INTRAVENOUS
  Filled 2022-06-25 (×8): qty 5

## 2022-06-25 MED ORDER — ACETAMINOPHEN 325 MG PO TABS
650.0000 mg | ORAL_TABLET | Freq: Four times a day (QID) | ORAL | Status: DC | PRN
  Administered 2022-06-25 – 2022-06-28 (×10): 650 mg via ORAL
  Filled 2022-06-25 (×10): qty 2

## 2022-06-25 MED ORDER — MIRTAZAPINE 15 MG PO TABS
15.0000 mg | ORAL_TABLET | Freq: Every day | ORAL | Status: DC
  Administered 2022-06-25 – 2022-06-27 (×3): 15 mg via ORAL
  Filled 2022-06-25 (×3): qty 1

## 2022-06-25 NOTE — Progress Notes (Signed)
ANTICOAGULATION CONSULT NOTE - Initial Consult  Pharmacy Consult for heparin Indication: atrial fibrillation and pulmonary embolus - warfarin on hold  Allergies  Allergen Reactions   Hydrocodone-Acetaminophen Shortness Of Breath   Oxycodone Hcl Shortness Of Breath   Telmisartan-Hctz Shortness Of Breath, Palpitations and Other (See Comments)    Dizziness also   Aspirin Rash and Other (See Comments)    Confusion, also- Cannot take high doses   Atorvastatin Other (See Comments)    Myalgia    Buspirone Hcl Other (See Comments)    Headaches    Codeine Hives, Nausea And Vomiting and Other (See Comments)    Confusion also   Morphine Sulfate Itching   Paroxetine Other (See Comments)    Paresthesias: R arm, R leg, r side of face   Rabeprazole Other (See Comments)    headache   Ramipril Other (See Comments)    Unknown reaction    Patient Measurements: Height: '5\' 10"'$  (177.8 cm) Weight: 77.3 kg (170 lb 6.7 oz) IBW/kg (Calculated) : 73 Heparin Dosing Weight: n/a. Use TBW  Vital Signs: Temp: 100.6 F (38.1 C) (08/15 0430) Temp Source: Oral (08/15 0430) BP: 110/61 (08/15 0430) Pulse Rate: 91 (08/15 0430)  Labs: Recent Labs    07/07/2022 1013 06/25/22 0417  HGB 11.7* 9.2*  HCT 34.6* 28.1*  PLT 84* 53*  LABPROT 23.0* 24.1*  INR 2.1* 2.2*  CREATININE 1.35* 1.29*  TROPONINIHS 20*  --     Estimated Creatinine Clearance: 50.3 mL/min (A) (by C-G formula based on SCr of 1.29 mg/dL (H)).   Medical History: Past Medical History:  Diagnosis Date   Anticoagulants causing adverse effect in therapeutic use    Anxiety    Paxil seemed to cause odd neuro side effects; better on prozac as of 09/2015   Aortic regurgitation 04/2007 surgery   Resolved with tissue AVR at [facility]   Cervical spondylosis    ESI by [name] in [place]. 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 (most recent 07/2017)   History  of prostate cancer 2003   Rad prost   History of pulmonary embolism 2011; 10/2014   Recurrence off of anticoagulation 10/2014: per hem/onc ([name]) pt needs lifelong anticoagulation (hypercoag w/u neg).   Hyperlipidemia    statin-intolerant, except for crestor low dose.   Hypertension    Migraine syndrome    Mitral regurgitation    PAF (paroxysmal atrial fibrillation) (HCC)    Panic attacks    "           "             "                  "                  "                       "                             "                          Pulmonary nodule, right 2015   RLL: resected at [facility] --VATS; Benign RLL nodule (fungal and AFB stains neg).  Plan is for [facility]  thoracic surg to do f/u o/v & CT chest 1 yr     Medications: Pt on warfarin PTA for atrial fibrillation and history of recurrent PE -Per notes from previous hospitalization and med reconciliation completed with niece, it is unclear if patient taking his warfarin consistently  Information from most recent [facility] clinic note on 05/17/22: -Dose: Warfarin 15 mg Sun, Thurs; 10 mg all other days -INR goal: 2.5 - 3  Assessment: Pt is a 65 yoM on warfarin chronically for history of PE and atrial fibrillation. PMH also significant for CLL, not currently receiving chemotherapy (last dose on 04/11/22) and leukopenia/thrombocytopenia.   Pharmacy consulted to transition anticoagulation from warfarin to heparin drip in anticipation for invasive procedures.   Today, 06/25/22 INR = 2.2 is subtherapeutic  CBC: Hgb and Plt low and decreased but consistent with labs during previous admission; pt receiving weekly NPlate (next dose scheduled for today)  Goal of Therapy:  Heparin level 0.3-0.7 units/ml INR 2.5 - 3 Monitor platelets by anticoagulation protocol: Yes   Plan:  Hold warfarin - discontinued pharmacy consult  Initiate heparin infusion since INR is below patients goal of 2.5 -3 No initial bolus given thrombocytopenia and INR only  slightly below goal. Initiate heparin infusion at 1100 units/hr Check 8 hour HL HL, CBC daily. Leave daily INR ordered to monitor for when safe to proceed with invasive procedures  If heparin to be held for procedure, please communicate timing with pharmacy.   Lenis Noon, PharmD 06/25/2022,7:31 AM

## 2022-06-25 NOTE — Consult Note (Signed)
Neurology Consultation  Reason for Consult: altered mental status Referring Physician: Dr. Starla Link  CC: AMS  History is obtained from:chart  HPI: [name] is a 76 y.o. male with history of CLL, bioprosthetic aortic valve replacement, CKD 3b, PE on Coumadin, PAF and recent hospitalization for AMS, AKI and neutropenia presents with altered mental status.  At that time, EEG  was negative for seizure activity and MRI showed no acute abnormality.  Patient was discharged on midodrine for orthostatic hypotension and admitted again after being found unresponsive in a parking lot.  Mental status initially improved with narcan.  Patient is unable to give a history at this time.    ROS:  Unable to obtain due to altered mental status.   Past Medical History:  Diagnosis Date   Anticoagulants causing adverse effect in therapeutic use    Anxiety    Paxil seemed to cause odd neuro side effects; better on prozac as of 09/2015   Aortic regurgitation 04/2007 surgery   Resolved with tissue AVR at [facility]   Cervical spondylosis    ESI by [name] in [place]. 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 (most recent 07/2017)   History of prostate cancer 2003   Rad prost   History of pulmonary embolism 2011; 10/2014   Recurrence off of anticoagulation 10/2014: per hem/onc ([name]) pt needs lifelong anticoagulation (hypercoag w/u neg).   Hyperlipidemia    statin-intolerant, except for crestor low dose.   Hypertension    Migraine syndrome    Mitral regurgitation    PAF (paroxysmal atrial fibrillation) (HCC)    Panic attacks    "           "             "                  "                  "                       "                             "                          Pulmonary nodule, right 2015   RLL: resected at [facility] --VATS; Benign RLL nodule (fungal and AFB stains neg).  Plan is for [facility] thoracic surg to do f/u  o/v & CT chest 1 yr      Family History  Problem Relation Age of Onset   Cancer Sister        uterine   Colon cancer Sister 78   Stroke Mother    Prostate cancer Father    Stroke Sister    Stroke Brother    Heart attack Neg Hx      Social History:   reports that he quit smoking about 41 years ago. His smoking use included cigarettes. He started smoking about 53 years ago. He has a 12.00 pack-year smoking history. He has never used smokeless tobacco. He reports that he does not currently use alcohol. He reports that he does not use drugs.  Medications  Current Facility-Administered Medications:    0.9 %  sodium  chloride infusion, , Intravenous, Continuous, Alekh, Kshitiz, MD, Last Rate: 100 mL/hr at 06/25/22 0335, Infusion Verify at 06/25/22 0335   acetaminophen (TYLENOL) tablet 650 mg, 650 mg, Oral, Q6H PRN, Starla Link, Kshitiz, MD, 650 mg at 06/25/22 1143   acyclovir (ZOVIRAX) tablet 400 mg, 400 mg, Oral, Daily, Trifan, Carola Rhine, MD, 400 mg at 06/25/22 0929   albuterol (PROVENTIL) (2.5 MG/3ML) 0.083% nebulizer solution 2.5 mg, 2.5 mg, Nebulization, Q2H PRN, Starla Link, Kshitiz, MD   cholecalciferol (VITAMIN D3) 25 MCG (1000 UNIT) tablet 2,000 Units, 2,000 Units, Oral, q morning, Trifan, Carola Rhine, MD, 2,000 Units at 06/25/22 0930   docusate sodium (COLACE) capsule 100 mg, 100 mg, Oral, Daily, Trifan, Carola Rhine, MD, 100 mg at 06/25/22 0929   heparin ADULT infusion 100 units/mL (25000 units/216m), 1,100 Units/hr, Intravenous, Continuous, SLenis Noon RPH, Last Rate: 11 mL/hr at 06/25/22 0941, 1,100 Units/hr at 06/25/22 0941   meropenem (MERREM) 1 g in sodium chloride 0.9 % 100 mL IVPB, 1 g, Intravenous, Q8H, SLenis Noon RPH, Last Rate: 200 mL/hr at 06/25/22 04782 1 g at 06/25/22 09562  midodrine (PROAMATINE) tablet 2.5 mg, 2.5 mg, Oral, TID WC, TWyvonnia Dusky MD, 2.5 mg at 06/25/22 1152   mirtazapine (REMERON) tablet 15 mg, 15 mg, Oral, QHS, Ennever, PRudell Cobb MD   multivitamin with  minerals tablet 1 tablet, 1 tablet, Oral, Daily, KDonnetta Simpers MD, 1 tablet at 06/25/22 1152   ondansetron (ZOFRAN) tablet 4 mg, 4 mg, Oral, Q6H PRN **OR** ondansetron (ZOFRAN) injection 4 mg, 4 mg, Intravenous, Q6H PRN, AStarla Link Kshitiz, MD   rosuvastatin (CRESTOR) tablet 5 mg, 5 mg, Oral, Daily, TLangston Masker MCarola Rhine MD, 5 mg at 06/25/22 01308  senna-docusate (Senokot-S) tablet 1 tablet, 1 tablet, Oral, QHS PRN, AStarla Link Kshitiz, MD   sertraline (ZOLOFT) tablet 100 mg, 100 mg, Oral, q morning, Trifan, MCarola Rhine MD, 100 mg at 06/25/22 06578  thiamine (VITAMIN B1) 500 mg in normal saline (50 mL) IVPB, 500 mg, Intravenous, Q8H, Last Rate: 100 mL/hr at 06/25/22 1155, 500 mg at 06/25/22 1155 **FOLLOWED BY** [START ON 06/27/2022] thiamine (VITAMIN B1) 250 mg in sodium chloride 0.9 % 50 mL IVPB, 250 mg, Intravenous, Daily **FOLLOWED BY** [START ON 07/03/2022] thiamine (VITAMIN B1) injection 100 mg, 100 mg, Intravenous, Daily, KDonnetta Simpers MD   traMADol (ULTRAM) tablet 50 mg, 50 mg, Oral, Q6H PRN, AStarla Link Kshitiz, MD   Exam: Current vital signs: BP 110/61 (BP Location: Right Arm)   Pulse 91   Temp (!) 100.6 F (38.1 C) (Oral)   Resp 20   Ht '5\' 10"'$  (1.778 m)   Wt 77.3 kg   SpO2 96%   BMI 24.45 kg/m  Vital signs in last 24 hours: Temp:  [98.5 F (36.9 C)-100.6 F (38.1 C)] 100.6 F (38.1 C) (08/15 0430) Pulse Rate:  [72-91] 91 (08/15 0430) Resp:  [16-20] 20 (08/15 0430) BP: (109-123)/(61-81) 110/61 (08/15 0430) SpO2:  [96 %-100 %] 96 % (08/15 0430) Weight:  [77.3 kg] 77.3 kg (08/14 1654)  GENERAL: Drowsy, in no acute distress Psych: Affect appropriate for situation, patient is calm and cooperative with examination Head: Normocephalic and atraumatic, without obvious abnormality EENT: Normal conjunctivae, moist mucous membranes, no OP obstruction LUNGS: Normal respiratory effort. Non-labored breathing on room air Extremities: warm, well perfused, without obvious deformity  NEURO:   Mental Status: Drowsy and oriented to self only He is not able to provide a clear and coherent history of present illness. Speech/Language: speech is clear  and fluent with some perseveration on different words and topics.   3/3 registration, 0/3 recall. Unable to name any animsls with 4 feet or perform basic calculations. No neglect is noted Cranial Nerves:  II: PERRL III, IV, VI: EOMI. Lid elevation symmetric and full.  V: Sensation is intact to light touch and symmetrical to face.  VII: Face is symmetric resting and smiling.  VIII: Hearing intact to voice IX, X: Phonation normal.  XI: Normal sternocleidomastoid and trapezius muscle strength XII: Tongue protrudes midline without fasciculations.   Motor: 5/5 strength is all muscle groups.  Tone is normal. Bulk is normal.  Sensation: Intact to light touch bilaterally in all four extremities.  Coordination: Unable to perform due to altered mental status DTRs: 2+ throughout.  Gait: Deferred   Labs I have reviewed labs in epic and the results pertinent to this consultation are:   CBC    Component Value Date/Time   WBC 1.0 (LL) 06/25/2022 0417   RBC 2.91 (L) 06/25/2022 0417   HGB 9.2 (L) 06/25/2022 0417   HGB 11.3 (L) 05/31/2022 1309   HGB 14.3 07/22/2017 0756   HGB 14.4 12/20/2011 1004   HCT 28.1 (L) 06/25/2022 0417   HCT 41.2 07/22/2017 0756   HCT 43.3 12/20/2011 1004   PLT 53 (L) 06/25/2022 0417   PLT 97 (L) 05/31/2022 1309   PLT 131 (L) 07/22/2017 0756   PLT 208 12/20/2011 1004   MCV 96.6 06/25/2022 0417   MCV 95 07/22/2017 0756   MCV 93.3 12/20/2011 1004   MCH 31.6 06/25/2022 0417   MCHC 32.7 06/25/2022 0417   RDW 14.3 06/25/2022 0417   RDW 12.5 07/22/2017 0756   RDW 13.4 12/20/2011 1004   LYMPHSABS 2.0 07/11/2022 1013   LYMPHSABS 1.0 07/22/2017 0756   LYMPHSABS 4.6 (H) 12/20/2011 1004   MONOABS 0.3 06/22/2022 1013   MONOABS 0.7 12/20/2011 1004   EOSABS 0.0 06/21/2022 1013   EOSABS 0.1 07/22/2017 0756    BASOSABS 0.0 06/17/2022 1013   BASOSABS 0.0 07/22/2017 0756   BASOSABS 0.1 12/20/2011 1004    CMP     Component Value Date/Time   NA 137 06/25/2022 0417   NA 145 07/22/2017 0756   NA 139 06/27/2016 1410   K 4.6 06/25/2022 0417   K 4.3 07/22/2017 0756   K 5.1 06/27/2016 1410   CL 111 06/25/2022 0417   CL 105 07/22/2017 0756   CO2 20 (L) 06/25/2022 0417   CO2 30 07/22/2017 0756   CO2 28 06/27/2016 1410   GLUCOSE 95 06/25/2022 0417   GLUCOSE 98 07/22/2017 0756   BUN 18 06/25/2022 0417   BUN 17 07/22/2017 0756   BUN 20.9 06/27/2016 1410   CREATININE 1.29 (H) 06/25/2022 0417   CREATININE 1.53 (H) 05/22/2022 0901   CREATININE 1.6 (H) 07/22/2017 0756   CREATININE 1.6 (H) 06/27/2016 1410   CALCIUM 8.0 (L) 06/25/2022 0417   CALCIUM 9.6 07/22/2017 0756   CALCIUM 9.6 06/27/2016 1410   PROT 5.5 (L) 06/25/2022 0417   PROT 6.7 07/22/2017 0756   PROT 7.1 06/27/2016 1410   ALBUMIN 2.9 (L) 06/25/2022 0417   ALBUMIN 3.7 07/22/2017 0756   ALBUMIN 4.5 05/12/2017 1347   ALBUMIN 4.1 06/27/2016 1410   AST 25 06/25/2022 0417   AST 23 05/22/2022 0901   AST 18 06/27/2016 1410   ALT 31 06/25/2022 0417   ALT 18 05/22/2022 0901   ALT 24 07/22/2017 0756   ALT 24 06/27/2016 1410   ALKPHOS 110  06/25/2022 0417   ALKPHOS 59 07/22/2017 0756   ALKPHOS 62 06/27/2016 1410   BILITOT 0.5 06/25/2022 0417   BILITOT 0.5 05/22/2022 0901   BILITOT 0.46 06/27/2016 1410   GFRNONAA 57 (L) 06/25/2022 0417   GFRNONAA 47 (L) 05/22/2022 0901   GFRAA 50 (L) 08/10/2020 1017    Lipid Panel     Component Value Date/Time   CHOL 170 05/18/2021 1427   TRIG 195 (H) 06/17/2022 0302   HDL 35.80 (L) 05/18/2021 1427   CHOLHDL 5 05/18/2021 1427   VLDL 47.6 (H) 05/18/2021 1427   LDLCALC 110 (H) 05/12/2020 0840   LDLDIRECT 95.0 05/18/2021 1427     Imaging I have reviewed the images obtained:  CT-scan of the brain: No acute abnormality  MRI examination of the brain: No intracranial metastatic disease, chronic  small vessel ischemia and volume loss  Assessment: 76 year old patient with CLL presents with altered mental status after being found unresponsive in a parking lot.  Mental status initially improved with narcan, raising the possibility of opioid intoxication, but AMS persists despite no possible culprit medications being administered.  Differential includes delirium, seizure activity, and toxic metabolic encephalopathy from infection.  UTI is negative, but patient has been slightly febrile to 100.6 with notable neutropenia (WBC 1.0).  He is receiving meropenem for febrile neutropenia.  Thiamine was low normal at 67, and this is being repleted.  INR is 2.2, and platelet count is 53, so LP cannot be performed at this time to search for infectious causes of AMS.  Impression:Toxic metabolic encephalopathy vs. Delirium vs. Seizure activity  Recommendations: - Repeat brain MRI - Would avoid LP in patient with thrombocytopenia with elevated INR if possible - Consider repeat EEG or consider LTM EEG given episode of unresponsiveness and persistent AMS - UDS - Continue midodrine for orthostasis - Continue to replete thiamine  Pt seen by NP/Neuro and later by MD. Note/plan to be edited by MD as needed.   [place] , MSN, AGACNP-BC [facility] See Amion for schedule and pager information 06/25/2022 12:40 PM

## 2022-06-25 NOTE — TOC Initial Note (Signed)
Transition of Care [facility]) - Initial/Assessment Note    Patient Details  Name: [name] MRN: [id number] Date of Birth: Mar 13, 1946  Transition of [facility]) CM/SW Contact:    Leeroy Cha, RN Phone Number: 06/25/2022, 8:16 AM  Clinical Narrative:                 [place] is [place].  Is not on chemo at this time.  Is confused to time and place.  Expected Discharge Plan: Skilled Nursing Facility Barriers to Discharge: Continued Medical Work up   Patient Goals and CMS Choice Patient states their goals for this hospitalization and ongoing recovery are:: unable to state due to confusion      Expected Discharge Plan and Services Expected Discharge Plan: [place]   Discharge Planning Services: CM Consult Post Acute Care Choice: [place] Living arrangements for the past 2 months: [place]                                      Prior Living Arrangements/Services Living arrangements for the past 2 months: [place] Lives with:: Facility Resident Patient language and need for interpreter reviewed:: Yes              Criminal Activity/Legal Involvement Pertinent to Current Situation/Hospitalization: No - Comment as needed  Activities of Daily Living Home Assistive Devices/Equipment: None ADL Screening (condition at time of admission) Patient's cognitive ability adequate to safely complete daily activities?: Yes Is the patient deaf or have difficulty hearing?: No Does the patient have difficulty seeing, even when wearing glasses/contacts?: No Does the patient have difficulty concentrating, remembering, or making decisions?: Yes Patient able to express need for assistance with ADLs?: Yes Does the patient have difficulty dressing or bathing?: Yes Independently performs ADLs?: No Communication: Independent Dressing (OT): Needs assistance Is this a change from baseline?: Change from baseline,  expected to last <3days Grooming: Needs assistance Is this a change from baseline?: Change from baseline, expected to last <3 days Feeding: Independent (Pt was sent to Rehab and admitting back from there) Bathing: Needs assistance Is this a change from baseline?: Change from baseline, expected to last <3 days Toileting: Needs assistance Is this a change from baseline?: Change from baseline, expected to last <3 days In/Out Bed: Needs assistance Is this a change from baseline?: Change from baseline, expected to last <3 days Walks in Home: Independent (Per niece, prior to going to Rehab) Does the patient have difficulty walking or climbing stairs?: Yes Weakness of Legs: Both Weakness of Arms/Hands: None  Permission Sought/Granted                  Emotional Assessment Appearance:: Appears stated age Attitude/Demeanor/Rapport: Other (comment) (confused) Affect (typically observed): Calm   Alcohol / Substance Use: Tobacco Use (hx of tobacco use) Psych Involvement: No (comment)  Admission diagnosis:  Encephalopathy [G93.40] Unresponsiveness [W09.81] Acute metabolic encephalopathy [X91.47] Patient Active Problem List   Diagnosis Date Noted   Acute metabolic encephalopathy 82/95/6213   Severe aortic stenosis 06/15/2022   Chronic diastolic CHF (congestive heart failure) ([place]) 06/15/2022   Seizure-like activity ([place]) 06/15/2022   Orthostatic hypotension 06/15/2022   Altered mental status 06/14/2022   AKI (acute kidney injury) ([place]) 06/14/2022   Transaminitis 06/14/2022   Liver lesion, right lobe 06/14/2022   AMS (altered mental status) 06/14/2022   IDA (iron deficiency anemia) 10/08/2021  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/2021   Aortic atherosclerosis ([place]) 07/25/2021   Neutropenia ([place])    Pancytopenia ([place])    Encounter for therapeutic drug monitoring 09/21/2019   PTSD (post-traumatic stress disorder) 09/29/2018   BPPV (benign paroxysmal positional vertigo)  09/04/2017   Former smoker 08/29/2016   Cervical spondylosis    S/P Bioprosthetic AVR (aortic valve replacement) in 2011 03/22/2015   History of pulmonary embolism 03/07/2015   Recurrent pulmonary embolism ([place]) 11/09/2014   Lung nodule 11/02/2014   Chronic lymphocytic leukemia ([place]) 11/11/2013   Migraine 10/08/2010   GERD 12/11/2009   MITRAL REGURGITATION 04/07/2009   Insomnia 02/03/2008   Anxiety state 09/06/2007   HLD (hyperlipidemia) 04/20/2007   Essential hypertension 04/20/2007   Paroxysmal atrial fibrillation ([place]) 04/20/2007   PROSTATE CANCER, HX OF 04/20/2007   PCP:  [name], MD Pharmacy:   [id number] [id number]: [phone]: [phone]    Social Determinants of Health (SDOH) Interventions    Readmission Risk Interventions    06/16/2022    5:17 PM  Readmission Risk Prevention Plan  Transportation Screening Complete  PCP or Specialist Appt within 3-5 Days Complete  HRI or [place]  Social Work Consult for [place]/Counseling Complete  Palliative Care Screening Not Applicable  Medication Review (Press photographer Referral to Pharmacy

## 2022-06-25 NOTE — Progress Notes (Signed)
ANTICOAGULATION CONSULT NOTE  Pharmacy Consult for heparin Indication: atrial fibrillation and pulmonary embolus - warfarin on hold  Allergies  Allergen Reactions   Hydrocodone-Acetaminophen Shortness Of Breath   Oxycodone Hcl Shortness Of Breath   Telmisartan-Hctz Shortness Of Breath, Palpitations and Other (See Comments)    Dizziness also   Aspirin Rash and Other (See Comments)    Confusion, also- Cannot take high doses   Atorvastatin Other (See Comments)    Myalgia    Buspirone Hcl Other (See Comments)    Headaches    Codeine Hives, Nausea And Vomiting and Other (See Comments)    Confusion also   Morphine Sulfate Itching   Paroxetine Other (See Comments)    Paresthesias: R arm, R leg, r side of face   Rabeprazole Other (See Comments)    headache   Ramipril Other (See Comments)    Unknown reaction    Patient Measurements: Height: '5\' 10"'$  (177.8 cm) Weight: 77.3 kg (170 lb 6.7 oz) IBW/kg (Calculated) : 73 Heparin Dosing Weight: n/a. Use TBW  Vital Signs: Temp: 98.2 F (36.8 C) (08/15 1242) Temp Source: Oral (08/15 1242) BP: 102/66 (08/15 1711) Pulse Rate: 77 (08/15 1711)  Labs: Recent Labs    06/13/2022 1013 06/25/22 0417 06/25/22 1755  HGB 11.7* 9.2*  --   HCT 34.6* 28.1*  --   PLT 84* 53*  --   LABPROT 23.0* 24.1*  --   INR 2.1* 2.2*  --   HEPARINUNFRC  --   --  0.25*  CREATININE 1.35* 1.29*  --   TROPONINIHS 20*  --   --      Estimated Creatinine Clearance: 50.3 mL/min (A) (by C-G formula based on SCr of 1.29 mg/dL (H)).   Medical History: Past Medical History:  Diagnosis Date   Anticoagulants causing adverse effect in therapeutic use    Anxiety    Paxil seemed to cause odd neuro side effects; better on prozac as of 09/2015   Aortic regurgitation 04/2007 surgery   Resolved with [facility]R at [facility]   Cervical spondylosis    ESI by [name] in [place]. 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 (most recent 07/2017)   History of prostate cancer 2003   Rad prost   History of pulmonary embolism 2011; 10/2014   Recurrence off of anticoagulation 10/2014: per hem/onc ([name]) pt needs lifelong anticoagulation (hypercoag w/u neg).   Hyperlipidemia    statin-intolerant, except for crestor low dose.   Hypertension    Migraine syndrome    Mitral regurgitation    PAF (paroxysmal atrial fibrillation) (HCC)    Panic attacks    "           "             "                  "                  "                       "                             "  Pulmonary nodule, right 2015   RLL: resected at [facility] --VATS; Benign RLL nodule (fungal and AFB stains neg).  Plan is for [facility] thoracic surg to do f/u o/v & CT chest 1 yr     Medications: Pt on warfarin PTA for atrial fibrillation and history of recurrent PE -Per notes from previous hospitalization and med reconciliation completed with niece, it is unclear if patient taking his warfarin consistently  Information from most recent [facility] clinic note on 05/17/22: -Dose: Warfarin 15 mg Sun, Thurs; 10 mg all other days -INR goal: 2.5 - 3  Assessment: Pt is a 82 yoM on warfarin chronically for history of PE and atrial fibrillation. PMH also significant for CLL, not currently receiving chemotherapy (last dose on 04/11/22) and leukopenia/thrombocytopenia.   Pharmacy consulted to transition anticoagulation from warfarin to heparin drip in anticipation for invasive procedures.   Today, 06/25/22 INR = 2.2 is subtherapeutic  CBC: Hgb and Plt low and decreased but consistent with labs during previous admission; pt receiving weekly NPlate (received today)  Update - evening HL 0.25 - subtherapeutic on heparin 1100 units/hr Per RN, no bleeding or infusion issues noted  Goal of Therapy:  Heparin level 0.3-0.7 units/ml INR 2.5 - 3 Monitor platelets by anticoagulation protocol: Yes   Plan:  Holding  warfarin Increase heparin infusion to 1250 units/hr - defer bolus given close to goal and downtrending platelets Check HL 8 hours after rate change HL, CBC daily. Leave daily INR ordered to monitor for when safe to proceed with invasive procedures  If heparin to be held for procedure, please communicate timing with pharmacy.   Tawnya Crook, PharmD, BCPS Clinical Pharmacist 06/25/2022 6:51 PM

## 2022-06-25 NOTE — TOC Progression Note (Signed)
Transition of Care [facility]) - Progression Note    Patient Details  Name: AQUIL DUHE MRN: [id number] Date of Birth: [birth year]  [facility]) CM/SW Contact  Leeroy Cha, RN Phone Number: 06/25/2022, 9:00 AM  Clinical Narrative:    Greig Right- will need help with getting patient the care that he needs.  Has [facility] the [facility] and will give her my name and number and have her call for possible placement.  Family has been caring for patient at home but his has deteriorated to where they can not.   Expected Discharge Plan: [place] Barriers to Discharge: Continued Medical Work up  Expected Discharge Plan and Services Expected Discharge Plan: [place]   Discharge Planning Services: CM Consult Post Acute Care Choice: [place] Living arrangements for the past 2 months: [place]                                       Social Determinants of Health (SDOH) Interventions    Readmission Risk Interventions    06/16/2022    5:17 PM  Readmission Risk Prevention Plan  Transportation Screening Complete  PCP or Specialist Appt within 3-5 Days Complete  HRI or Home Care Consult Complete  Social Work Consult for [place] Planning/Counseling Complete  Palliative Care Screening Not Applicable  Medication Review Press photographer) Referral to Pharmacy

## 2022-06-25 NOTE — Progress Notes (Signed)
Pharmacy Antibiotic Note  [name] is a 76 y.o. male admitted on 06/23/2022 with  febrile neutropenia .  Pharmacy has been consulted for meropenem dosing.  Today, 06/25/22 WBC 1, ANC 0.7 Tmax 101 F SCr 1.29, CrCl 50 mL/min  Plan: Meropenem 1 g IV q8h Monitor renal function and culture data  Height: '5\' 10"'$  (177.8 cm) Weight: 77.3 kg (170 lb 6.7 oz) IBW/kg (Calculated) : 73  Temp (24hrs), Avg:99.7 F (37.6 C), Min:98.5 F (36.9 C), Max:101 F (38.3 C)  Recent Labs  Lab 06/18/22 0824 06/19/22 0742 06/20/22 0352 07/08/2022 1013 06/25/22 0417  WBC 5.2 3.0* 1.9* 3.0* 1.0*  CREATININE 1.24 1.04 1.05 1.35* 1.29*    Estimated Creatinine Clearance: 50.3 mL/min (A) (by C-G formula based on SCr of 1.29 mg/dL (H)).    Allergies  Allergen Reactions   Hydrocodone-Acetaminophen Shortness Of Breath   Oxycodone Hcl Shortness Of Breath   Telmisartan-Hctz Shortness Of Breath, Palpitations and Other (See Comments)    Dizziness also   Aspirin Rash and Other (See Comments)    Confusion, also- Cannot take high doses   Atorvastatin Other (See Comments)    Myalgia    Buspirone Hcl Other (See Comments)    Headaches    Codeine Hives, Nausea And Vomiting and Other (See Comments)    Confusion also   Morphine Sulfate Itching   Paroxetine Other (See Comments)    Paresthesias: R arm, R leg, r side of face   Rabeprazole Other (See Comments)    headache   Ramipril Other (See Comments)    Unknown reaction    Antimicrobials this admission: meropenem 8/15 >>   Dose adjustments this admission:  Microbiology results: 8/14 BCx:   Lenis Noon, PharmD 06/25/2022 8:04 AM

## 2022-06-25 NOTE — Progress Notes (Signed)
Critical lab value called to nurse.  WBC 1.0   [name] at bedside, and given result. Orders placed.  TR[facility] also notified of result.

## 2022-06-25 NOTE — Progress Notes (Signed)
Pt transported off unit to MRI with heparin gtt transfusing on MRI's pump. P. Angelica Pou RN

## 2022-06-25 NOTE — Consult Note (Signed)
Unfortunately, [name] is back in the hospital.  This did not last long.  He was discharged I think last Thursday or Friday.  He subsequently was in our office when he passed out.  He went out of the emergency room.  He subsequently was readmitted.  Again, he is not on chemotherapy.  He has had no chemotherapy for at least 2 months or so.  When he was in the hospital a week ago, we did do a bone marrow biopsy on him.  This showed minimal CLL.  He had adequate myeloid precursors.  He had abundant megakaryocytes.  He still has leukopenia and thrombocytopenia.  I do not know if this might be autoimmune.  When he came to the office, his white cell count is 3.  Hemoglobin 11.7.  Platelet count 84,000.  This morning, his white cell count is 1.  Hemoglobin 9.2.  Platelet count 53,000.  He is gotten nplate and Neupogen.  He is going to need to be on this weekly.  He still has some cognitive changes.  I do not know why he would have these.  I really believe that he needs to be seen by neurology and that unfortunately, I do think a spinal tap needs to be done.  I realize he is on Coumadin.  The Coumadin can be stopped and he will be placed on to heparin or Lovenox until a LP can be done.  He had a CT of the brain which was unremarkable when he came in.  He had a chest x-ray which was unremarkable.  He did have IVIG when he was in the hospital last week.  At the time, his IgG level was 438 mg/dL.  Again, I just am not clear as to why he decompensates so quickly.  There really is not much in the bone marrow that would suggest any type of hematologic malignancy.  Again, I just had to believe there is some form of autoimmune influence.  He was not sure what was this morning.  I am not sure he he realized what had happened to him yesterday.  He was at rehab.  Again I am not sure if anything happened at rehab.  He does have a low-grade temperature.  His blood pressure is 110/61.  His lungs are clear.   Cardiac exam shows a 3/6 systolic ejection murmur consistent with the aortic stenosis.  By his last echocardiogram, he has what appears to be severe aortic stenosis.  I do not know if this might be a factor with his fatigue and weakness.  His abdominal exam is soft.  Bowel sounds are present.  There is no fluid wave.  Neurological exam shows altered mentation.  Skin exam shows no rashes.  Again, this is incredibly complicated.  It is just not clear as to why he decompensates so quickly.  Hopefully, we will figure out what might be going on with him.  Again we will give him a dose of Neupogen and Nplate.  Again, I do think that he does need to have a spinal tap done.  I do not know if you would cooperate for a MRI of the brain.  He does have some mild renal insufficiency.  There is no problems with hyponatremia or hypercalcemia.  His albumin is low.  Apparently, he has not been eating.  We will follow along and try to help as much as possible.  [name] Haw, MD  [name] 1:5-7

## 2022-06-25 NOTE — Consult Note (Signed)
Palliative Medicine Inpatient Consult Note  Consulting Provider: Aline August, MD  Reason for consult:   [name] Palliative Medicine Consult  Reason for Consult? goals of care   06/25/2022  HPI:  Per intake H&P --> [name] is a 76 y.o. male with medical history significant of CLL,  bioprosthetic aortic valve replacement on Coumadin, CKD stage IIIb,recurrent PE on Coumadin, of paroxysmal atrial fibrillation, recent hospitalization from 06/14/2022 - 06/20/2022 for altered mental status, AKI and neutropenia.  He is being follow by Dr. Marin Bell of Oncology.   Palliative care has been asked to get involved to discuss goals of care in the setting of multiple co-morbidities and known CLL.   Clinical Assessment/Goals of Care:  *Please note that this is a verbal dictation therefore any spelling or grammatical errors are due to the "[name]" system interpretation.  I have reviewed medical records including EPIC notes, labs and imaging, received report from bedside RN, assessed the patient who is lying in bed in no acute distress, he was quite forgetful and cannot tell me why he was in the hospital.   I called patient's niece, [name] to further discuss diagnosis prognosis, [name], EOL wishes, disposition and options.   I introduced Palliative Medicine as specialized medical care for people living with serious illness. It focuses on providing relief from the symptoms and stress of a serious illness. The goal is to improve quality of life for both the patient and the family.  Medical History Review and Understanding:  I reviewed [name]'s history of CLL, chronic kidney disease, atrial fibrillation, aortic valve replacement, and pulmonary embolism.  Social History:  [name]  He has never been married nor had any children.  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  He is very well educated and  was known within his family to be a "world traveler".  He is a strong-willed man and greatly enjoyed his independence prior to his recent recurrent rehospitalization's.  He used to work at [employer] and is mainly is a kind and giving person.  He is a religious man and practices within the [name].  Functional and Nutritional State:  Prior to [name]'s last hospital stay he had been living alone and functioning independently.  His appetite had also been fair at that time.  After his last hospitalization he went to [facility] with the hope of strengthening though per his niece ended up back in the hospital  Advance Directives:  A detailed discussion was had today regarding advanced directives.  Patient's niece [name] and [name] are his healthcare power of attorney's.  [name] shares that she will bring in this documentation tomorrow to be scanned into her electronic medical record.  Code Status:  Encouraged patient/family to consider DNR/DNI status understanding evidenced based poor outcomes in similar hospitalized patient, as the cause of arrest is likely associated with advanced chronic/terminal illness rather than an easily reversible acute cardio-pulmonary event. I explained that DNR/DNI does not change the medical plan and it only comes into effect after a person has arrested (died).  It is a protective measure to keep [name] from harming the patient in their last moments of life. [name] was agreeable to DNR/DNI with understanding that patient would not receive CPR, defibrillation, ACLS medications, or intubation.   [name] shares that she and [name] have had many conversations about this very topic and she expresses that if he were to go through resuscitation  and were to have an even worse quality of life than he is experiencing at this moment this would not be consistent with what he would desire moving forward.  Discussion:  [name] and I discussed the reason for hospitalization which to her somewhat but  will during.  She shares that she spoke with Dr. [place] Bell who does not have a clear picture of why he is unable to sustain outside of the hospital setting either other than the suspicion he cannot produce enough bone marrow to sustain.  She shares that the goals moving forward are for [name] to improve.  She expresses that at this point in time she is in agreement with further work-up inclusive of a lumbar puncture should this be the identified next step.  [name] reviews the importance of [name] in her life and shares that they grew up more so as brother and sister than as niece and nephew.   [name] vocalizes that moving forward she is aware that [name] cannot live home alone anymore and states that she is willing to hire 24/7 supportive care.   I shared that I would request to [facility] team reach out to her regarding this as well.   Discussed the importance of continued conversation with family and their  medical providers regarding overall plan of care and treatment options, ensuring decisions are within the context of the patients values and GOCs.  Decision Maker:  SUMMARY OF RECOMMENDATIONS   DNAR/DNI -->FULL SCOPE OF CARE otherwise  Patient family trusting of Dr. Marin Bell and his projected work up  [name] (niece) will bring in HCPOA documentation  Patients niece plans to be here at [facility] tomorrow for additional conversations  Goals for improvement and eventually for patient to transition home --> Niece feels patient will need 24/7 support and hopes to speak to [facility] regarding this  Plan for OP Palliative support on discharge  Ongoing Palliative support  Code Status/Advance Care Planning: DNAR/DNI  Palliative Prophylaxis:  Aspiration, Bowel Regimen, Delirium Protocol, Frequent Pain Assessment, Oral Care, Palliative Wound Care, and Turn Reposition  Additional Recommendations (Limitations, Scope, Preferences): Continue current scope  Psycho-social/Spiritual:  Desire for further Chaplaincy  support: Not presently Additional Recommendations: Review and education on reason for hospitalization, CPR.   Prognosis: Enhanced 12 month mortality risk.  Discharge Planning: Discharge plan is presently uncertain.   Vitals:   06/18/2022 2000 06/25/22 0430  BP: 112/62 110/61  Pulse: 90 91  Resp: 20 20  Temp: 100.2 F (37.9 C) (!) 100.6 F (38.1 C)  SpO2: 100% 96%    Intake/Output Summary (Last 24 hours) at 06/25/2022 0735 Last data filed at 06/25/2022 2671 Gross per 24 hour  Intake 2242.51 ml  Output 1035 ml  Net 1207.51 ml   Last Weight  Most recent update: 07/09/2022  4:54 PM    Weight  77.3 kg (170 lb 6.7 oz)            Gen:  Older AA M in NAD HEENT: moist mucous membranes CV: Regular rate and irregular rhythm  PULM:  On RA - breathing is even and nonlabored ABD: soft/nontender  EXT: No edema  Neuro: Disoriented to place, time, situation  PPS: 20%   This conversation/these recommendations were discussed with patient primary care team, Dr. Starla Link  Billing based on MDM: High  Problems Addressed: Bell acute or chronic illness or injury that poses a threat to life or bodily function  Amount and/or Complexity of Data: Category 3:Discussion of management or test interpretation with external physician/other  qualified health care professional/appropriate source (not separately reported)  Risks: Decision regarding hospitalization or escalation of hospital care and Decision not to resuscitate or to de-escalate care because of poor prognosis ______________________________________________________ [place] Team Team Cell Phone: [phone] Please utilize secure chat with additional questions, if there is no response within 30 minutes please call the above phone number  Palliative Medicine Team providers are available by phone from 7am to 7pm daily and can be reached through the team cell phone.  Should this patient require  assistance outside of these hours, please call the patient's attending physician.

## 2022-06-25 NOTE — Progress Notes (Signed)
PROGRESS NOTE    [name]  FHL:[id number] DOB: Mar 17, 1946 DOA: 06/16/2022 PCP: [name], MD   Brief Narrative:  76 y.o. male with medical history significant of CLL,  bioprosthetic aortic valve replacement on Coumadin, CKD stage IIIb,recurrent PE on Coumadin, of paroxysmal atrial fibrillation, recent hospitalization from 06/14/2022 - 06/20/2022 for altered mental status, AKI and neutropenia.  During this hospitalization, oncology was consulted and patient underwent bone marrow biopsy; treated with Granix for neutropenia by oncology and recommended outpatient follow-up with oncology.  Ammonia, B12 and TSH levels were normal.  MRI of brain was unrevealing; there was a question of seizure-like activity while getting an MRI of brain; EEG was negative for seizures; neurology evaluated the patient and subsequently signed off.  This was attributed to orthostatic hypotension and patient was prescribed midodrine.  He was subsequently discharged to SNF on 06/20/2022. He presented on 06/19/2022 to oncology/Dr. Antonieta Pert office for follow-up.  He was found in the parking lot unresponsive at [facility] and subsequently taken to the ED. in the ED,  His mental status improved after intranasal administration of Narcan.  He subsequently remained somnolent and confused.  CT of the head was negative for any acute intracranial abnormity.  Sodium 134, creatinine 1.35, troponin 20, platelets 84 (improved from 62 on 06/20/2022).  Assessment & Plan:   Acute encephalopathy: Acute metabolic encephalopathy versus acute toxic encephalopathy Possible syncope -Unclear what happened.  Patient was found passed out in the parking lot today and mental status improved after intranasal Narcan administration.  CT of the head negative for acute abnormality. -Does not remember what happened. Recently had similar hospitalization with altered mental status and?  Seizure with extensive work-up including MRI of brain and EEG  negative and neurology thought that symptoms were because of orthostatic hypotension and patient was placed on midodrine.  Patient is still very slow to respond.  Also had fever overnight and placed on meropenem by oncology.  Oncology recommending neurology evaluation to see if patient needs an LP.  I have asked neurology for consultation -Unclear if patient had syncopal episode as well from orthostasis. -Monitor mental status.  Fall precautions.  PT eval.  Avoid narcotics and sedatives.  Probable neutropenic fever -Patient had fever overnight.  He is pancytopenic.  Has been started on IV meropenem by oncology.  Blood cultures and UA obtained on admission.  UA on the workable.  Check chest x-ray.   Acute injury --creatinine 1.35 on presentation, was 1.05 on 06/20/2022.  Current on IV fluids.  Creatinine 1.29 today.  Repeat a.m. labs   Hyponatremia -Mild.  IV fluids.  Resolved   Severe aortic stenosis with bioprosthetic AVR -Had recent TTE.  Cardiology recommended no further inpatient evaluation during recent hospitalization and recommended outpatient follow-up for valve in valve TAVR consideration   Orthostatic hypotension -Had extensive work-up during recent hospitalization.  Continue midodrine.   History of CLL Pancytopenia -Outpatient follow-up with oncology.  Had appointment today but could not make it.  Will add [name] to care teams. -Monitor CBC.     History of pulmonary embolism Paroxysmal A-fib -Coumadin has been switched to heparin drip by [name] in case patient needs an LP.  Rate controlled.  Currently not on any rate controlling medications.   Hyperlipidemia -Continue rosuvastatin  Acute urinary retention -Foley catheter placed on admission.  Will DC Foley catheter and do in and out cath if needed.   Chronic diastolic CHF -Recent TTE showed LVEF of 50% with  grade 2 diastolic dysfunction and severe aortic stenosis -Currently compensated.  Strict input and  output.  Daily weights.  Not on diuretics at home   Physical deconditioning -PT eval.  Patient was recently discharged to SNF.  TOC eval   Goals of care -Palliative care consultation for goals of care.     DVT prophylaxis: Heparin drip Code Status: Full Family Communication: None at bedside Disposition Plan: Status is: Inpatient Remains inpatient appropriate because: Of severity of illness  Consultants: Oncology/palliative care/neurology  Procedures: None  Antimicrobials: Meropenem from 06/25/2022 onwards  Subjective: Patient seen and examined at bedside.  Still extremely slow to respond.  Had fever overnight.  No seizures, vomiting, agitation reported.  Objective: Vitals:   06/16/2022 1654 06/25/2022 2000 06/25/22 0430 06/25/22 1242  BP:  112/62 110/61 (!) 83/53  Pulse:  90 91 94  Resp:  $Remo'20 20 18  'XoMBX$ Temp:  100.2 F (37.9 C) (!) 100.6 F (38.1 C) 98.2 F (36.8 C)  TempSrc:  Oral Oral Oral  SpO2:  100% 96% 96%  Weight: 77.3 kg     Height: $Remove'5\' 10"'cEtOTFO$  (1.778 m)       Intake/Output Summary (Last 24 hours) at 06/25/2022 1305 Last data filed at 06/25/2022 1100 Gross per 24 hour  Intake 1362.51 ml  Output 1035 ml  Net 327.51 ml   Filed Weights   06/14/2022 1654  Weight: 77.3 kg    Examination:  General exam: Appears calm and comfortable.  Looks chronically ill and deconditioned.  Currently on room air. Respiratory system: Bilateral decreased breath sounds at bases with some scattered crackles Cardiovascular system: S1 & S2 heard, Rate controlled Gastrointestinal system: Abdomen is nondistended, soft and nontender. Normal bowel sounds heard. Extremities: No cyanosis, clubbing; trace lower extremity edema present  Central nervous system: Awake, extremely slow to respond.  Poor historian.  No focal neurological deficits. Moving extremities Skin: No rashes, lesions or ulcers Psychiatry: Extremely flat affect.  Currently no signs of agitation.   Data Reviewed: I have personally  reviewed following labs and imaging studies  CBC: Recent Labs  Lab 06/19/22 0742 06/20/22 0352 06/17/2022 1013 06/25/22 0417  WBC 3.0* 1.9* 3.0* 1.0*  NEUTROABS 0.9*  --  0.7*  --   HGB 10.5* 9.6* 11.7* 9.2*  HCT 31.6* 30.0* 34.6* 28.1*  MCV 94.6 96.5 93.5 96.6  PLT 60* 62* 84* 53*   Basic Metabolic Panel: Recent Labs  Lab 06/19/22 0742 06/20/22 0352 07/06/2022 1013 06/25/22 0417  NA 142 141 134* 137  K 3.3* 3.9 4.6 4.6  CL 114* 114* 103 111  CO2 $Re'23 22 23 'GFQ$ 20*  GLUCOSE 99 104* 94 95  BUN $Re'11 12 17 18  'fIT$ CREATININE 1.04 1.05 1.35* 1.29*  CALCIUM 8.5* 8.6* 9.4 8.0*  MG 1.7 1.9 2.0 1.9  PHOS  --  2.7  --   --    GFR: Estimated Creatinine Clearance: 50.3 mL/min (A) (by C-G formula based on SCr of 1.29 mg/dL (H)). Liver Function Tests: Recent Labs  Lab 06/19/22 0742 07/10/2022 1013 06/25/22 0417  AST 90* 26 25  ALT 72* 37 31  ALKPHOS 132* 144* 110  BILITOT 0.6 0.7 0.5  PROT 5.7* 6.2* 5.5*  ALBUMIN 3.0* 4.0 2.9*   No results for input(s): "LIPASE", "AMYLASE" in the last 168 hours. Recent Labs  Lab 06/14/2022 1013  AMMONIA 21   Coagulation Profile: Recent Labs  Lab 06/19/22 0742 06/20/22 0352 07/01/2022 1013 06/25/22 0417  INR 2.9* 3.1* 2.1* 2.2*   Cardiac Enzymes: No  results for input(s): "CKTOTAL", "CKMB", "CKMBINDEX", "TROPONINI" in the last 168 hours. BNP (last 3 results) No results for input(s): "PROBNP" in the last 8760 hours. HbA1C: No results for input(s): "HGBA1C" in the last 72 hours. CBG: Recent Labs  Lab 06/29/2022 0956  GLUCAP 92   Lipid Profile: No results for input(s): "CHOL", "HDL", "LDLCALC", "TRIG", "CHOLHDL", "LDLDIRECT" in the last 72 hours. Thyroid Function Tests: No results for input(s): "TSH", "T4TOTAL", "FREET4", "T3FREE", "THYROIDAB" in the last 72 hours. Anemia Panel: No results for input(s): "VITAMINB12", "FOLATE", "FERRITIN", "TIBC", "IRON", "RETICCTPCT" in the last 72 hours. Sepsis Labs: No results for input(s): "PROCALCITON",  "LATICACIDVEN" in the last 168 hours.  Recent Results (from the past 240 hour(s))  SARS Coronavirus 2 by RT PCR (hospital order, performed in [facility] hospital lab) *cepheid single result test* Anterior Nasal Swab     Status: None   Collection Time: 06/28/2022 10:23 AM   Specimen: Anterior Nasal Swab  Result Value Ref Range Status   SARS Coronavirus 2 by RT PCR NEGATIVE NEGATIVE Final    Comment: (NOTE) SARS-CoV-2 target nucleic acids are NOT DETECTED.  The SARS-CoV-2 RNA is generally detectable in upper and lower respiratory specimens during the acute phase of infection. The lowest concentration of SARS-CoV-2 viral copies this assay can detect is 250 copies / mL. A negative result does not preclude SARS-CoV-2 infection and should not be used as the sole basis for treatment or other patient management decisions.  A negative result may occur with improper specimen collection / handling, submission of specimen other than nasopharyngeal swab, presence of viral mutation(s) within the areas targeted by this assay, and inadequate number of viral copies (<250 copies / mL). A negative result must be combined with clinical observations, patient history, and epidemiological information.  Fact Sheet for Patients:   https://www.patel.info/  Fact Sheet for Healthcare Providers: https://hall.com/  This test is not yet approved or  cleared by the [place] FDA and has been authorized for detection and/or diagnosis of SARS-CoV-2 by FDA under an Emergency Use Authorization (EUA).  This EUA will remain in effect (meaning this test can be used) for the duration of the COVID-19 declaration under Section 564(b)(1) of the Act, 21 U.S.C. section 360bbb-3(b)(1), unless the authorization is terminated or revoked sooner.  Performed at [facility], [address] [place], [place] [address]   Blood culture (routine x 2)     Status: None  (Preliminary result)   Collection Time: 06/18/2022  2:06 PM   Specimen: BLOOD  Result Value Ref Range Status   Specimen Description   Final    BLOOD RIGHT ANTECUBITAL Performed at [facility], [address] [place], [place] [address]    Special Requests   Final    BOTTLES DRAWN AEROBIC AND ANAEROBIC Blood Culture results may not be optimal due to an inadequate volume of blood received in culture bottles Performed at [facility], [address] [place], [place] [address]    Culture   Final    NO GROWTH < 24 HOURS Performed at [facility], [place] [address], [place], [place] [address]    Report Status PENDING  Incomplete  Blood culture (routine x 2)     Status: None (Preliminary result)   Collection Time: 07/10/2022  3:59 PM   Specimen: BLOOD  Result Value Ref Range Status   Specimen Description   Final    BLOOD RIGHT ANTECUBITAL Performed at [facility], [place] [address], [place], [place] [address]    Special Requests   Final    IN PEDIATRIC BOTTLE Blood Culture adequate volume Performed at [facility] [address] [place], [place] [address]    Culture   Final    NO GROWTH < 24 HOURS Performed at [facility] [address] [place], [place] [address]    Report Status PENDING  Incomplete         Radiology Studies: CT Head Wo Contrast  Result Date: 06/23/2022 CLINICAL DATA:  Mental status change, unknown cause on coumadin, here for LOC - evaluation for ICH EXAM: CT HEAD WITHOUT CONTRAST TECHNIQUE: Contiguous axial images were obtained from the base of the skull through the vertex without intravenous contrast. RADIATION DOSE REDUCTION: This exam was performed according to the departmental dose-optimization program which includes automated exposure control, adjustment of the mA and/or kV according to patient size and/or use of iterative reconstruction technique. COMPARISON:  CT head 06/13/2022.  FINDINGS: Brain: No evidence of acute infarction, hemorrhage, hydrocephalus, extra-axial collection or mass lesion/mass effect. Chronic microvascular ischemic disease and cerebral atrophy. Vascular: No hyperdense vessel. Skull: No acute fracture. Sinuses/Orbits: Left sphenoid no evidence otherwise, visualized sinuses are clear. No acute orbital findings. Other: No mastoid effusions. IMPRESSION: Of acute intracranial abnormality. Electronically Signed   By: Margaretha Sheffield M.D.   On: 07/04/2022 10:40   DG Chest Portable 1 View  Result Date: 06/18/2022 CLINICAL DATA:  Syncope EXAM: PORTABLE CHEST 1 VIEW COMPARISON:  06/13/2022 FINDINGS: The heart size and mediastinal contours are within normal limits for technique. Both lungs are clear. No pleural effusion or pneumothorax. The visualized skeletal structures are unremarkable. IMPRESSION: No active disease. Electronically Signed   By: Macy Mis M.D.   On: 07/01/2022 10:27            Aline August, MD [facility] 06/25/2022, 1:05 PM

## 2022-06-25 NOTE — Evaluation (Signed)
Physical Therapy Evaluation Patient Details Name: [name] MRN: [id number] DOB: 1946/01/25 Today's Date: 06/25/2022  History of Present Illness  76 y.o. male admitted with  Acute metabolic encephalopathy versus acute toxic encephalopathy  Possible syncope  PMH:  CLL,  bioprosthetic aortic valve replacement on Coumadin, CKD stage IIIb,recurrent PE on Coumadin, of paroxysmal atrial fibrillation, (recent hospitalization from 06/14/2022 - 06/20/2022 for altered mental status, AKI and neutropenia)  Clinical Impression  Pt admitted with above diagnosis.  Pt is pleasantly confused, follows most one step commands. Oriented to self and place. Pt presents with balance deficits and is deconditioned. Will benefit from SNF to improve independence and safety   Pt currently with functional limitations due to the deficits listed below (see PT Problem List). Pt will benefit from skilled PT to increase their independence and safety with mobility to allow discharge to the venue listed below.          Recommendations for follow up therapy are one component of a multi-disciplinary discharge planning process, led by the attending physician.  Recommendations may be updated based on patient status, additional functional criteria and insurance authorization.  Follow Up Recommendations Skilled nursing-short term rehab (<3 hours/day) Can patient physically be transported by private vehicle: Yes    Assistance Recommended at Discharge Frequent or constant Supervision/Assistance  Patient can return home with the following  A little help with walking and/or transfers;A little help with bathing/dressing/bathroom;Assistance with cooking/housework;Direct supervision/assist for medications management;Help with stairs or ramp for entrance;Direct supervision/assist for financial management    Equipment Recommendations None recommended by PT  Recommendations for Other Services       Functional Status Assessment Patient has  had a recent decline in their functional status and demonstrates the ability to make significant improvements in function in a reasonable and predictable amount of time.     Precautions / Restrictions Precautions Precautions: Fall Restrictions Weight Bearing Restrictions: No      Mobility  Bed Mobility Overal bed mobility: Needs Assistance Bed Mobility: Supine to Sit     Supine to sit: Min assist     General bed mobility comments: assist with trunk and to progress LEs off bed    Transfers Overall transfer level: Needs assistance Equipment used: Rolling walker (2 wheels) Transfers: Sit to/from Stand Sit to Stand: Min assist           General transfer comment: VCs for hand placement, assist to power up to stand    Ambulation/Gait Ambulation/Gait assistance: Min assist Gait Distance (Feet): 30 Feet Assistive device: Rolling walker (2 wheels) Gait Pattern/deviations: Step-through pattern, Decreased stride length       General Gait Details: verbal cues for RW position and step length, assist to steady. fatigued after above distance and needed seated rest. HR 70s to 80s  Stairs            Wheelchair Mobility    Modified Rankin (Stroke Patients Only)       Balance   Sitting-balance support: Feet supported, Single extremity supported, No upper extremity supported Sitting balance-Leahy Scale: Good     Standing balance support: Reliant on assistive device for balance, During functional activity, Bilateral upper extremity supported Standing balance-Leahy Scale: Poor                               Pertinent Vitals/Pain Pain Assessment Pain Assessment: No/denies pain    Home Living Family/patient expects to be discharged to:: Skilled nursing  facility Living Arrangements: Alone Available Help at Discharge: Family Type of Home: House         Home Layout: One level Home Equipment: None      Prior Function Prior Level of Function :  Independent/Modified Independent             Mobility Comments: pt reports he enjoyed traveling and his favorite place to visit is [place]       Journalist, newspaper        Extremity/Trunk Assessment   Upper Extremity Assessment Upper Extremity Assessment: Defer to OT evaluation    Lower Extremity Assessment Lower Extremity Assessment: Generalized weakness       Communication   Communication: No difficulties  Cognition Arousal/Alertness: Awake/alert Behavior During Therapy: WFL for tasks assessed/performed Overall Cognitive Status: Impaired/Different from baseline Area of Impairment: Orientation, Memory, Following commands                 Orientation Level: Disoriented to, Situation, Time Current Attention Level: Sustained Memory: Decreased short-term memory Following Commands: Follows one step commands with increased time, Follows multi-step commands inconsistently     Problem Solving: Slow processing, Decreased initiation, Difficulty sequencing, Requires verbal cues, Requires tactile cues          General Comments      Exercises     Assessment/Plan    PT Assessment Patient needs continued PT services  PT Problem List Decreased strength;Decreased mobility;Decreased safety awareness;Decreased cognition;Decreased activity tolerance;Decreased balance       PT Treatment Interventions DME instruction;Therapeutic activities;Gait training;Therapeutic exercise;Patient/family education;Balance training;Stair training;Functional mobility training;Neuromuscular re-education    PT Goals (Current goals can be found in the Care Plan section)  Acute Rehab PT Goals Patient Stated Goal: not stated PT Goal Formulation: With patient Time For Goal Achievement: 07/09/22 Potential to Achieve Goals: Good    Frequency Min 3X/week     Co-evaluation               AM-PAC PT "6 Clicks" Mobility  Outcome Measure Help needed turning from your back to your side  while in a flat bed without using bedrails?: A Little Help needed moving from lying on your back to sitting on the side of a flat bed without using bedrails?: A Little Help needed moving to and from a bed to a chair (including a wheelchair)?: A Little Help needed standing up from a chair using your arms (e.g., wheelchair or bedside chair)?: A Little Help needed to walk in hospital room?: A Little Help needed climbing 3-5 steps with a railing? : A Lot 6 Click Score: 17    End of Session Equipment Utilized During Treatment: Gait belt Activity Tolerance: Patient tolerated treatment well Patient left: with call bell/phone within reach;with chair alarm set;in chair Nurse Communication: Mobility status PT Visit Diagnosis: Other abnormalities of gait and mobility (R26.89);Muscle weakness (generalized) (M62.81)    Time: 1448-1856 PT Time Calculation (min) (ACUTE ONLY): 19 min   Charges:   PT Evaluation $PT Eval Low Complexity: [place], PT  Acute Rehab Dept [facility]) [phone]  [facility] Weekend Pager [facility] only)  [phone]  06/25/2022   [facility] 06/25/2022, 5:01 PM

## 2022-06-25 NOTE — Evaluation (Signed)
Clinical/Bedside Swallow Evaluation Patient Details  Name: [name] MRN: [id number] Date of Birth: [birth year]  Today's Date: 06/25/2022 Time: SLP Start Time (ACUTE ONLY): 1835 SLP Stop Time (ACUTE ONLY): 6734 SLP Time Calculation (min) (ACUTE ONLY): 10 min  Past Medical History:  Past Medical History:  Diagnosis Date   Anticoagulants causing adverse effect in therapeutic use    Anxiety    Paxil seemed to cause odd neuro side effects; better on prozac as of 09/2015   Aortic regurgitation 04/2007 surgery   Resolved with tissue AVR at [facility]   Cervical spondylosis    ESI by [name] in [place]. [employer] years martial arts training   Chronic lymphocytic leukemia (CLL), B-cell ([place]) approx 2012   stable on f/u's with [name] (most recent 07/2017)   History of prostate cancer 2003   Rad prost   History of pulmonary embolism 2011; 10/2014   Recurrence off of anticoagulation 10/2014: per hem/onc ([name]) pt needs lifelong anticoagulation (hypercoag w/u neg).   Hyperlipidemia    statin-intolerant, except for crestor low dose.   Hypertension    Migraine syndrome    Mitral regurgitation    PAF (paroxysmal atrial fibrillation) (HCC)    Panic attacks    "           "             "                  "                  "                       "                             "                          Pulmonary nodule, right 2015   RLL: resected at [facility] --VATS; Benign RLL nodule (fungal and AFB stains neg).  Plan is for [facility] thoracic surg to do f/u o/v & CT chest 1 yr    Past Surgical History:  Past Surgical History:  Procedure Laterality Date   AORTIC VALVE REPLACEMENT  2011   [place]  (bioprosthetic)   CARDIAC CATHETERIZATION  04/2010   Normal coronaries.   Carotid dopplers  08/2015   NORMAL   CATARACT EXTRACTION     L 12/06/09   R 09/14/09   COLONOSCOPY  01/26/13   tic's, o/w normal.  ([place])--recall 10 yrs.   PENILE PROSTHESIS IMPLANT     PROSTATECTOMY  04/2002   for prostate cancer    Pulmonary nodule resection  Fall/winter 2016   [facility]   SHOULDER SURGERY     rotator cuff on both arms    TEE WITHOUT CARDIOVERSION N/A 07/13/2021   Procedure: TRANSESOPHAGEAL ECHOCARDIOGRAM (TEE);  Surgeon: Pixie Casino, MD;  Location: [facility] ENDOSCOPY;  Service: Cardiovascular;  Laterality: N/A;   TIBIALIS TENDON TRANSFER / REPAIR Right 03/02/2019   Procedure: RECONSTRUCTION OF RIGHT  ANTERIOR TIBIALIS TENDON;  Surgeon: Erle Crocker, MD;  Location: [place];  Service: Orthopedics;  Laterality: Right;   TRANSTHORACIC ECHOCARDIOGRAM  09/20/2014; 03/2016; 03/2017   2015-mild LVH, EF 50-55%, wall motion nl, grade I diast dysfxn, prosth aort valve good,transaortic gradients decreased compared to echo 11/2013.  03/2016: EF 55-60%, normal LV function, severe LVH, normal diast fxn, mild increase in AV gradient compared to 09/2014 echo.  2018: EF 55-60%, normal LV fxn, grd II DD, AV stable/gradient's stable.   HPI:  Per PT note, 76 y.o. male admitted with  Acute metabolic encephalopathy versus acute toxic encephalopathy  Possible syncope  PMH:  CLL,  bioprosthetic aortic valve replacement on Coumadin, CKD stage IIIb,recurrent PE on Coumadin, of paroxysmal atrial fibrillation, (recent hospitalization from 06/14/2022 - 06/20/2022 for altered mental status, AKI and neutropenia).  Swallow evaluation ordered.  RN reports pt tolerating po intake well.    Assessment / Plan / Recommendation  Clinical Impression  Patient presents with functional oropharyngeal swallow ability without clinical indication aspiration/penetration nor dysphagia with po observed including tea, Ensure and solids.  Intake was minimal as pt had just finished his dinner.   He was able to feed himself despite leaning toward left.  He had consumed portion of his fish, rice and carrots dinner- appears fruit was also on his tray but gone.    Pt observed with eructation at times - and admits to issues with reflux.  Pt prefers room  temperature drinks and warm tea - which SLP provided.  Notable was his language difficulties - expressive more than receptive - note neuro seeing pt.    Recommend continue his diet with general precautions  -- Left swallow precaution sign for pt/RN - Encouraged pt to take small bites/sips and follow reflux precautions.   No SLP follow up indicated, thanks for this consult. SLP Visit Diagnosis: Dysphagia, unspecified (R13.10)    Aspiration Risk  Mild aspiration risk    Diet Recommendation Regular;Thin liquid   Liquid Administration via: Cup;Straw Medication Administration: Whole meds with liquid Supervision: Patient able to self feed Compensations: Slow rate;Small sips/bites Postural Changes: Remain upright for at least 30 minutes after po intake;Seated upright at 90 degrees    Other  Recommendations Oral Care Recommendations: Oral care BID    Recommendations for follow up therapy are one component of a multi-disciplinary discharge planning process, led by the attending physician.  Recommendations may be updated based on patient status, additional functional criteria and insurance authorization.  Follow up Recommendations No SLP follow up      Assistance Recommended at Discharge None  Functional Status Assessment Patient has not had a recent decline in their functional status  Frequency and Duration            Prognosis        Swallow Study   General Date of Onset: 06/25/22 HPI: Per PT note, 76 y.o. male admitted with  Acute metabolic encephalopathy versus acute toxic encephalopathy  Possible syncope  PMH:  CLL,  bioprosthetic aortic valve replacement on Coumadin, CKD stage IIIb,recurrent PE on Coumadin, of paroxysmal atrial fibrillation, (recent hospitalization from 06/14/2022 - 06/20/2022 for altered mental status, AKI and neutropenia).  Swallow evaluation ordered.  RN reports pt tolerating po intake well. Type of Study: Bedside Swallow Evaluation Diet Prior to this Study:  Regular;Thin liquids Temperature Spikes Noted: No Respiratory Status: Room air History of Recent Intubation: No Behavior/Cognition: Alert;Cooperative;Pleasant mood Oral Cavity Assessment: Within Functional Limits Oral Care Completed by SLP: No Oral Cavity - Dentition: Adequate natural dentition Vision: Functional for self-feeding Self-Feeding Abilities: Able to feed self Patient Positioning: Upright in bed;Other (comment) (sitting upright but leaning to the left) Baseline Vocal Quality: Normal Volitional Cough: Other (Comment);Cognitively unable to elicit Volitional Swallow: Able to elicit    Oral/Motor/Sensory Function Overall Oral  Motor/Sensory Function: Within functional limits   Ice Chips Ice chips: Not tested   Thin Liquid Thin Liquid: Within functional limits Presentation: Straw;Self Fed    Nectar Thick Nectar Thick Liquid: Within functional limits Presentation: Straw;Self Fed   Honey Thick Honey Thick Liquid: Not tested   Puree Puree: Not tested   Solid     Solid: Within functional limits Presentation: Self Fredirick Lathe 06/25/2022,6:58 PM   Kathleen Lime, MS [place] Office [phone] Pager [phone]

## 2022-06-26 ENCOUNTER — Inpatient Hospital Stay: Payer: Self-pay

## 2022-06-26 ENCOUNTER — Inpatient Hospital Stay (HOSPITAL_COMMUNITY)
Admit: 2022-06-26 | Discharge: 2022-06-26 | Disposition: A | Discharge status: Home / Self Care | Payer: No Typology Code available for payment source | Attending: Neurology | Admitting: Neurology

## 2022-06-26 DIAGNOSIS — Z66 Do not resuscitate: Secondary | ICD-10-CM | POA: Diagnosis not present

## 2022-06-26 DIAGNOSIS — R4182 Altered mental status, unspecified: Secondary | ICD-10-CM

## 2022-06-26 DIAGNOSIS — Z515 Encounter for palliative care: Secondary | ICD-10-CM | POA: Diagnosis not present

## 2022-06-26 DIAGNOSIS — G9340 Encephalopathy, unspecified: Secondary | ICD-10-CM

## 2022-06-26 DIAGNOSIS — Z7189 Other specified counseling: Secondary | ICD-10-CM | POA: Diagnosis not present

## 2022-06-26 DIAGNOSIS — R4189 Other symptoms and signs involving cognitive functions and awareness: Secondary | ICD-10-CM | POA: Diagnosis not present

## 2022-06-26 DIAGNOSIS — G9341 Metabolic encephalopathy: Secondary | ICD-10-CM | POA: Diagnosis not present

## 2022-06-26 LAB — CBC WITH DIFFERENTIAL/PLATELET
Abs Immature Granulocytes: 0.12 10*3/uL — ABNORMAL HIGH (ref 0.00–0.07)
Basophils Absolute: 0.1 10*3/uL (ref 0.0–0.1)
Basophils Relative: 1 %
Eosinophils Absolute: 0 10*3/uL (ref 0.0–0.5)
Eosinophils Relative: 0 %
HCT: 32.2 % — ABNORMAL LOW (ref 39.0–52.0)
Hemoglobin: 10.2 g/dL — ABNORMAL LOW (ref 13.0–17.0)
Immature Granulocytes: 3 %
Lymphocytes Relative: 24 %
Lymphs Abs: 0.9 10*3/uL (ref 0.7–4.0)
MCH: 31.9 pg (ref 26.0–34.0)
MCHC: 31.7 g/dL (ref 30.0–36.0)
MCV: 100.6 fL — ABNORMAL HIGH (ref 80.0–100.0)
Monocytes Absolute: 0.1 10*3/uL (ref 0.1–1.0)
Monocytes Relative: 3 %
Neutro Abs: 2.6 10*3/uL (ref 1.7–7.7)
Neutrophils Relative %: 69 %
Platelets: 37 10*3/uL — ABNORMAL LOW (ref 150–400)
RBC: 3.2 MIL/uL — ABNORMAL LOW (ref 4.22–5.81)
RDW: 14.6 % (ref 11.5–15.5)
WBC: 3.8 10*3/uL — ABNORMAL LOW (ref 4.0–10.5)
nRBC: 0 % (ref 0.0–0.2)

## 2022-06-26 LAB — COMPREHENSIVE METABOLIC PANEL
ALT: 32 U/L (ref 0–44)
AST: 33 U/L (ref 15–41)
Albumin: 3 g/dL — ABNORMAL LOW (ref 3.5–5.0)
Alkaline Phosphatase: 112 U/L (ref 38–126)
Anion gap: 7 (ref 5–15)
BUN: 20 mg/dL (ref 8–23)
CO2: 20 mmol/L — ABNORMAL LOW (ref 22–32)
Calcium: 8.1 mg/dL — ABNORMAL LOW (ref 8.9–10.3)
Chloride: 111 mmol/L (ref 98–111)
Creatinine, Ser: 1.52 mg/dL — ABNORMAL HIGH (ref 0.61–1.24)
GFR, Estimated: 47 mL/min — ABNORMAL LOW (ref >60)
Glucose, Bld: 90 mg/dL (ref 70–99)
Potassium: 4.4 mmol/L (ref 3.5–5.1)
Sodium: 138 mmol/L (ref 135–145)
Total Bilirubin: 0.5 mg/dL (ref 0.3–1.2)
Total Protein: 5.7 g/dL — ABNORMAL LOW (ref 6.5–8.1)

## 2022-06-26 LAB — PROTIME-INR
INR: 2.3 — ABNORMAL HIGH (ref 0.8–1.2)
Prothrombin Time: 25.1 seconds — ABNORMAL HIGH (ref 11.4–15.2)

## 2022-06-26 LAB — HEPARIN LEVEL (UNFRACTIONATED): Heparin Unfractionated: 0.29 IU/mL — ABNORMAL LOW (ref 0.30–0.70)

## 2022-06-26 LAB — LACTIC ACID, PLASMA
Lactic Acid, Venous: 1 mmol/L (ref 0.5–1.9)
Lactic Acid, Venous: 1 mmol/L (ref 0.5–1.9)

## 2022-06-26 LAB — ABO/RH
ABO/RH(D): B POS
ABO/RH(D): B POS

## 2022-06-26 LAB — PATHOLOGIST SMEAR REVIEW

## 2022-06-26 LAB — IGG, IGA, IGM
IgA: 43 mg/dL — ABNORMAL LOW (ref 61–437)
IgG (Immunoglobin G), Serum: 742 mg/dL (ref 603–1613)
IgM (Immunoglobulin M), Srm: 27 mg/dL (ref 15–143)

## 2022-06-26 LAB — MRSA NEXT GEN BY PCR, NASAL: MRSA by PCR Next Gen: NOT DETECTED

## 2022-06-26 LAB — MAGNESIUM: Magnesium: 2 mg/dL (ref 1.7–2.4)

## 2022-06-26 MED ORDER — VITAMIN K1 10 MG/ML IJ SOLN
5.0000 mg | Freq: Once | INTRAMUSCULAR | Status: AC
  Administered 2022-06-26: 5 mg via INTRAVENOUS
  Filled 2022-06-26: qty 0.5

## 2022-06-26 MED ORDER — SODIUM CHLORIDE 0.9 % IV BOLUS
250.0000 mL | Freq: Once | INTRAVENOUS | Status: AC
  Administered 2022-06-26: 250 mL via INTRAVENOUS

## 2022-06-26 MED ORDER — VANCOMYCIN HCL 1500 MG/300ML IV SOLN
1500.0000 mg | Freq: Once | INTRAVENOUS | Status: AC
  Administered 2022-06-26: 1500 mg via INTRAVENOUS
  Filled 2022-06-26: qty 300

## 2022-06-26 MED ORDER — SODIUM CHLORIDE 0.9 % IV SOLN
2.0000 g | Freq: Two times a day (BID) | INTRAVENOUS | Status: DC
  Administered 2022-06-26 – 2022-06-28 (×3): 2 g via INTRAVENOUS
  Filled 2022-06-26 (×5): qty 2

## 2022-06-26 MED ORDER — SODIUM CHLORIDE 0.9 % IV SOLN
2.0000 g | Freq: Four times a day (QID) | INTRAVENOUS | Status: DC
  Administered 2022-06-26 – 2022-06-27 (×4): 2 g via INTRAVENOUS
  Filled 2022-06-26 (×5): qty 2000

## 2022-06-26 MED ORDER — HEPARIN (PORCINE) 25000 UT/250ML-% IV SOLN
1250.0000 [IU]/h | INTRAVENOUS | Status: DC
  Administered 2022-06-27: 1350 [IU]/h via INTRAVENOUS
  Administered 2022-06-28: 1250 [IU]/h via INTRAVENOUS
  Filled 2022-06-26 (×2): qty 250

## 2022-06-26 MED ORDER — SODIUM CHLORIDE 0.9% IV SOLUTION: Freq: Once | INTRAVENOUS | Status: AC

## 2022-06-26 MED ORDER — SODIUM CHLORIDE 0.9 % IV SOLN: 1.0000 g | Freq: Two times a day (BID) | INTRAVENOUS | Status: DC

## 2022-06-26 MED ORDER — VANCOMYCIN HCL IN DEXTROSE 1-5 GM/200ML-% IV SOLN
1000.0000 mg | INTRAVENOUS | Status: DC
  Administered 2022-06-27 – 2022-06-28 (×2): 1000 mg via INTRAVENOUS
  Filled 2022-06-26 (×2): qty 200

## 2022-06-26 MED ORDER — ORAL CARE MOUTH RINSE: 15.0000 mL | OROMUCOSAL | Status: DC | PRN

## 2022-06-26 MED ORDER — CHLORHEXIDINE GLUCONATE CLOTH 2 % EX PADS
6.0000 | MEDICATED_PAD | Freq: Every day | CUTANEOUS | Status: DC
  Administered 2022-06-26 – 2022-07-02 (×6): 6 via TOPICAL

## 2022-06-26 NOTE — Progress Notes (Addendum)
Pharmacy Antibiotic Note  [name] is a 76 y.o. male admitted on 06/27/2022 with  febrile neutropenia .  Pharmacy has been consulted for meropenem dosing.  Today, 06/26/22 WBC 3.8, ANC 2.6 - improved s/p Granix Tmax 100.9 F SCr 1.52 slightly increased, CrCl 43 mL/min Cultures remain negative at 24 hrs  Day #2 of antibiotics.   Plan: Decrease meropenem dose to 1 g IV q12h based on renal function Monitor renal function and culture data for ability to de-escalate  Height: '5\' 10"'$  (177.8 cm) Weight: 78.1 kg (172 lb 2.9 oz) IBW/kg (Calculated) : 73  Temp (24hrs), Avg:99.6 F (37.6 C), Min:98.2 F (36.8 C), Max:100.9 F (38.3 C)  Recent Labs  Lab 06/19/22 0742 06/20/22 0352 06/23/2022 1013 06/25/22 0417 06/26/22 0503  WBC 3.0* 1.9* 3.0* 1.0* 3.8*  CREATININE 1.04 1.05 1.35* 1.29* 1.52*     Estimated Creatinine Clearance: 42.7 mL/min (A) (by C-G formula based on SCr of 1.52 mg/dL (H)).    Allergies  Allergen Reactions   Hydrocodone-Acetaminophen Shortness Of Breath   Oxycodone Hcl Shortness Of Breath   Telmisartan-Hctz Shortness Of Breath, Palpitations and Other (See Comments)    Dizziness also   Aspirin Rash and Other (See Comments)    Confusion, also- Cannot take high doses   Atorvastatin Other (See Comments)    Myalgia    Buspirone Hcl Other (See Comments)    Headaches    Codeine Hives, Nausea And Vomiting and Other (See Comments)    Confusion also   Morphine Sulfate Itching   Paroxetine Other (See Comments)    Paresthesias: R arm, R leg, r side of face   Rabeprazole Other (See Comments)    headache   Ramipril Other (See Comments)    Unknown reaction    Antimicrobials this admission: meropenem 8/15 >>   Dose adjustments this admission: 8/16: Meropenem 1 g IV q8h >> 1 g IV q12h  Microbiology results: 8/14 BCx: ngtd  Lenis Noon, PharmD 06/26/2022 7:18 AM  Addendum: Pharmacy consulted to initiate vancomycin in addition to meropenem for febrile  neutropenia. Tmax of 101.7 this morning.   Given concern for meropenem lowering seizure threshold and ongoing AMS, will transition meropenem to ceftazidime per discussion with MD.   Plan: Vancomycin 1500 mg LD followed by 1000 mg IV q24h for estimated AUC of 447 Goal vancomycin AUC 400-550. Monitor renal function, check levels at steady state as needed or sooner if significant change in renal function.  D/c meropenem. Initiate ceftazidime 2 g IV q12h  Lenis Noon, PharmD 06/26/22 12:06 PM

## 2022-06-26 NOTE — Progress Notes (Signed)
Patient became hypotensive with high fever, was started on ceftazidime, ampicillin and vancomycin for possible meningitis.  Lumbar puncture with IR was requested however IR cannot do LP at this time due to coagulopathy with low platelet count.  Consulted PCCM they also feel that patient will be benefited by transferring to [facility] stepdown for LP by neurology.  Will give 2 units of platelets, also given 5 mg of IV vitamin K as INR is 2.3. Transfer orders to [facility] stepdown has been placed. Discussed with [name] who agreed with transfer

## 2022-06-26 NOTE — Progress Notes (Signed)
Pt back to the unit from MRI, IV pump switched back to room pump. Telemetry reapplied and verified with CCMD. Pt temp rechecked to be 98.8 on return to unit. Will continue to closely monitor. Delia Heady RN

## 2022-06-26 NOTE — Progress Notes (Signed)
ANTICOAGULATION CONSULT NOTE  Pharmacy Consult for heparin Indication: atrial fibrillation and pulmonary embolus - warfarin on hold  Allergies  Allergen Reactions   Hydrocodone-Acetaminophen Shortness Of Breath   Oxycodone Hcl Shortness Of Breath   Telmisartan-Hctz Shortness Of Breath, Palpitations and Other (See Comments)    Dizziness also   Aspirin Rash and Other (See Comments)    Confusion, also- Cannot take high doses   Atorvastatin Other (See Comments)    Myalgia    Buspirone Hcl Other (See Comments)    Headaches    Codeine Hives, Nausea And Vomiting and Other (See Comments)    Confusion also   Morphine Sulfate Itching   Paroxetine Other (See Comments)    Paresthesias: R arm, R leg, r side of face   Rabeprazole Other (See Comments)    headache   Ramipril Other (See Comments)    Unknown reaction    Patient Measurements: Height: '5\' 10"'$  (177.8 cm) Weight: 74.6 kg (164 lb 7.4 oz) IBW/kg (Calculated) : 73 Heparin Dosing Weight: n/a. Use TBW  Vital Signs: Temp: 98.8 F (37.1 C) (08/16 1419) Temp Source: Oral (08/16 1419) BP: 84/52 (08/16 1430) Pulse Rate: 83 (08/16 1419)  Labs: Recent Labs    06/29/2022 1013 06/25/22 0417 06/25/22 1755 06/26/22 0503  HGB 11.7* 9.2*  --  10.2*  HCT 34.6* 28.1*  --  32.2*  PLT 84* 53*  --  37*  LABPROT 23.0* 24.1*  --  25.1*  INR 2.1* 2.2*  --  2.3*  HEPARINUNFRC  --   --  0.25* 0.29*  CREATININE 1.35* 1.29*  --  1.52*  TROPONINIHS 20*  --   --   --      Estimated Creatinine Clearance: 42.7 mL/min (A) (by C-G formula based on SCr of 1.52 mg/dL (H)).  Medications: Pt on warfarin PTA for atrial fibrillation and history of recurrent PE -Per notes from previous hospitalization and med reconciliation completed with niece, it is unclear if patient taking his warfarin consistently  Information from most recent [facility] clinic note on 05/17/22: -Dose: Warfarin 15 mg Sun, Thurs; 10 mg all other days -INR goal: 2.5 - 3  Assessment: Pt  is a 10 yoM on warfarin chronically for history of PE and atrial fibrillation. PMH also significant for CLL, not currently receiving chemotherapy (last dose on 04/11/22) and leukopenia/thrombocytopenia.   Pharmacy consulted to transition anticoagulation from warfarin to heparin drip in anticipation for invasive procedures.   Today, 06/26/22 INR = 2.3 remains subtherapeutic (warfarin on hold) CBC: Hgb = 10.2 and Plt = 37K (low) and decreased but consistent with labs during previous admission; pt receiving weekly NPlate (last dose 1/96/22)\ Confirmed with RN that heparin infusing at correct rate. No signs of bleeding.  Heparin drip was stopped per MD order today at 1351 Vitamin K 5 mg IV & 2 U platelets ordered by MD in order to reverse warfarin & increase platelet count to be able to perform LP Pharmacy consulted to resume heparin drip   Goal of Therapy:  Heparin level 0.3-0.7 units/ml INR 2.5 - 3 Monitor platelets by anticoagulation protocol: Yes   Plan:  Holding warfarin MD ordered vitamin K 5 mg IV & 2 U Platelets Resume heparin drip at 1350 units/hr with no bolus Check HL 8 hours after resumed HL, CBC daily. Leave daily INR ordered to monitor for when safe to proceed with invasive procedures  If heparin to be held for procedure, please communicate timing with pharmacy.    Eudelia Bunch,  Pharm.D 06/26/2022 3:14 PM

## 2022-06-26 NOTE — Progress Notes (Signed)
ANTICOAGULATION CONSULT NOTE  Pharmacy Consult for heparin Indication: atrial fibrillation and pulmonary embolus - warfarin on hold  Allergies  Allergen Reactions   Hydrocodone-Acetaminophen Shortness Of Breath   Oxycodone Hcl Shortness Of Breath   Telmisartan-Hctz Shortness Of Breath, Palpitations and Other (See Comments)    Dizziness also   Aspirin Rash and Other (See Comments)    Confusion, also- Cannot take high doses   Atorvastatin Other (See Comments)    Myalgia    Buspirone Hcl Other (See Comments)    Headaches    Codeine Hives, Nausea And Vomiting and Other (See Comments)    Confusion also   Morphine Sulfate Itching   Paroxetine Other (See Comments)    Paresthesias: R arm, R leg, r side of face   Rabeprazole Other (See Comments)    headache   Ramipril Other (See Comments)    Unknown reaction    Patient Measurements: Height: '5\' 10"'$  (177.8 cm) Weight: 78.1 kg (172 lb 2.9 oz) IBW/kg (Calculated) : 73 Heparin Dosing Weight: n/a. Use TBW  Vital Signs: Temp: 100.2 F (37.9 C) (08/16 0459) Temp Source: Oral (08/16 0459) BP: 123/69 (08/16 0459) Pulse Rate: 98 (08/16 0459)  Labs: Recent Labs    06/15/2022 1013 06/25/22 0417 06/25/22 1755 06/26/22 0503  HGB 11.7* 9.2*  --  10.2*  HCT 34.6* 28.1*  --  32.2*  PLT 84* 53*  --  37*  LABPROT 23.0* 24.1*  --  25.1*  INR 2.1* 2.2*  --  2.3*  HEPARINUNFRC  --   --  0.25* 0.29*  CREATININE 1.35* 1.29*  --  1.52*  TROPONINIHS 20*  --   --   --      Estimated Creatinine Clearance: 42.7 mL/min (A) (by C-G formula based on SCr of 1.52 mg/dL (H)).   Medical History: Past Medical History:  Diagnosis Date   Anticoagulants causing adverse effect in therapeutic use    Anxiety    Paxil seemed to cause odd neuro side effects; better on prozac as of 09/2015   Aortic regurgitation 04/2007 surgery   Resolved [facility]R [facility]   Cervical spondylosis    ESI by [name] in [place]. 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 (most recent 07/2017)   History of prostate cancer 2003   Rad prost   History of pulmonary embolism 2011; 10/2014   Recurrence off of anticoagulation 10/2014: per hem/onc ([name]) pt needs lifelong anticoagulation (hypercoag w/u neg).   Hyperlipidemia    statin-intolerant, except for crestor low dose.   Hypertension    Migraine syndrome    Mitral regurgitation    PAF (paroxysmal atrial fibrillation) (HCC)    Panic attacks    "           "             "                  "                  "                       "                             "  Pulmonary nodule, right 2015   RLL: resected at [facility] --VATS; Benign RLL nodule (fungal and AFB stains neg).  Plan is for [facility] thoracic surg to do f/u o/v & CT chest 1 yr     Medications: Pt on warfarin PTA for atrial fibrillation and history of recurrent PE -Per notes from previous hospitalization and med reconciliation completed with niece, it is unclear if patient taking his warfarin consistently  Information from most recent [facility] clinic note on 05/17/22: -Dose: Warfarin 15 mg Sun, Thurs; 10 mg all other days -INR goal: 2.5 - 3  Assessment: Pt is a 22 yoM on warfarin chronically for history of PE and atrial fibrillation. PMH also significant for CLL, not currently receiving chemotherapy (last dose on 04/11/22) and leukopenia/thrombocytopenia.   Pharmacy consulted to transition anticoagulation from warfarin to heparin drip in anticipation for invasive procedures.   Today, 06/26/22 INR = 2.3 is subtherapeutic (warfarin on hold) CBC: Hgb = 10.2 and Plt = 37K (low) and decreased but consistent with labs during previous admission; pt receiving weekly NPlate (last dose 02/18/80) HL 0.29 - subtherapeutic on heparin 1250 units/hr Per RN, no bleeding or infusion issues noted  Goal of Therapy:  Heparin level 0.3-0.7 units/ml INR 2.5  - 3 Monitor platelets by anticoagulation protocol: Yes   Plan:  Holding warfarin Increase heparin infusion to 1350 units/hr  Check HL 8 hours after rate change HL, CBC daily. Leave daily INR ordered to monitor for when safe to proceed with invasive procedures  If heparin to be held for procedure, please communicate timing with pharmacy.   Everette Rank, PharmD 06/26/2022 5:52 AM

## 2022-06-26 NOTE — Procedures (Signed)
Patient Name: [name]  MRN: [id number]  Epilepsy Attending: Lora Havens  Referring Physician/Provider: Lorenza Chick, MD  Date: 06/26/2022 Duration:   Patient history: 76 year old patient with CLL presents with altered mental status after being found unresponsive in a parking lot.  EEG to evaluate for seizure.  Level of alertness: Awake, asleep  AEDs during EEG study: None  Technical aspects: This EEG study was done with scalp electrodes positioned according to the 10-20 International system of electrode placement. Electrical activity was reviewed with band pass filter of 1-'70Hz'$ , sensitivity of 7 uV/mm, display speed of 28m/sec with a '60Hz'$  notched filter applied as appropriate. EEG data were recorded continuously and digitally stored.  Video monitoring was available and reviewed as appropriate.  Description: The posterior dominant rhythm consists of 7.5 Hz activity of moderate voltage (25-35 uV) seen predominantly in posterior head regions, symmetric and reactive to eye opening and eye closing. Sleep was characterized by vertex waves, sleep spindles (12 to 14 Hz), maximal frontocentral region. Hyperventilation and photic stimulation were not performed.     IMPRESSION: This study is within normal limits. No seizures or epileptiform discharges were seen throughout the recording.  Sloane Palmer OBarbra Sarks

## 2022-06-26 NOTE — TOC Progression Note (Signed)
Transition of Care [facility]) - Progression Note    Patient Details  Name: [name] MRN: [id number] Date of Birth: [birth year]  Transition of [facility]) CM/SW Contact  Leeroy Cha, RN Phone Number: 06/26/2022, 11:42 AM  Clinical Narrative:    Tcf-poa ruth-is now wanting to take the patient home.  Is [facility] to get [facility] to [facility] nursing hours in the home and a long term care plan that he has but only pays $125.00 day.  Private duty nursing out of pocket would have to cover the rest.  Informed her that this patient needs 24 hour care.  She lives in [place] and the other family members live an hour away.  I did stress the fact of the financial burden of the out of pocket cost and the stress on the family to make sure someone is in the home with him 24 hours everyday.  Rod Holler I will discuss with the other family member and call back.. If  she wants patient to go to another snf other [place] that can be looked into as well.    Expected Discharge Plan: [place] Barriers to Discharge: Continued Medical Work up  Expected Discharge Plan and Services Expected Discharge Plan: [place]   Discharge Planning Services: CM Consult Post Acute Care Choice: [place] Living arrangements for the past 2 months: [place]                                       Social Determinants of Health (SDOH) Interventions    Readmission Risk Interventions    06/26/2022    9:26 AM 06/16/2022    5:17 PM  Readmission Risk Prevention Plan  Transportation Screening Complete Complete  PCP or Specialist Appt within 3-5 Days  Complete  HRI or [place] Walk  Complete  Social Work Consult for [place] Planning/Counseling  Complete  Palliative Care Screening  Not Applicable  Medication Review Press photographer) Complete Referral to Pharmacy  PCP or Specialist appointment within 3-5 days of discharge Complete   HRI or [place]  Complete   SW Recovery Care/Counseling Consult Complete   [place] Not Applicable

## 2022-06-26 NOTE — Progress Notes (Signed)
Received request for LP under fluoroscopic guidance. Chart review reveals INR 2.3  Plt 37. Recommend optimizing these prior to LP attempt. Additionally, bedside attempt should precede radiation exposure for procedure.  Electronically Signed: Pasty Spillers, PA-C 06/26/2022, 2:09 PM [phone]

## 2022-06-26 NOTE — Consult Note (Signed)
 NAME:  [name], MRN:  [id number], DOB:  08/09/1946, LOS: 1 ADMISSION DATE:  06/14/2022, CONSULTATION DATE:  06/26/2022 REFERRING MD:  Lama - TRH, CHIEF COMPLAINT:  AMS    History of Present Illness:  [name] is a 76 y.o. male with a PMH significant for but not limited to HTN, HLD, bioprosthetic aortic valve replacement on Coumadin, paroxysmal atrial fibrillation, right lower lobe resection, chronic lymphocytic leukemia, mitral regurgitation, and anxiety who presented to the emergency department 8/14 for complaints of altered mental status.  Of note patient was recently hospitalized at this facility 06/14/2022 through 06/20/2022 for altered mental status, AKI, and neutropenia.  Patient underwent bone marrow biopsy with oncology which showed minimal CLL.   Patient presented for outpatient oncology appointment 8/14 and was found minimally responsive in parking lot prompting EMS call.   On ED arrival patient was seen altered slight improvement seen with intranasal Narcan.  CT head negative.  Lab work significant for sodium 134, creatinine 1.35 alkaline phosphatase 144, high-sensitivity troponin 20 WBC 3.0, platelets 84, INR 2.3. Patient was admitted per TRH service for management admission.  PCCM consulted 8/16 for assistance in obtaining LP to rule out underlying meningitis.   Pertinent  Medical History  HTN, HLD, bioprosthetic aortic valve replacement on Coumadin, paroxysmal atrial fibrillation, CKD stage IIIb, right lower lobe resection, chronic lymphocytic leukemia, mitral regurgitation, and anxiety  Significant Hospital Events: Including procedures, antibiotic start and stop dates in addition to other pertinent events   8/14 admitted with altered mental status 8/16 PCCM consulted for assistance in obtaining LP   Interim History / Subjective:  As above   Objective   Blood pressure (!) 84/52, pulse 83, temperature 98.8 F (37.1 C), temperature source Oral, resp. rate (!) 21, height  5' 10" (1.778 m), weight 74.6 kg, SpO2 96 %.        Intake/Output Summary (Last 24 hours) at 06/26/2022 1509 Last data filed at 06/26/2022 1457 Gross per 24 hour  Intake 4300.78 ml  Output --  Net 4300.78 ml   Filed Weights   06/28/2022 1654 06/26/22 0500 06/26/22 1419  Weight: 77.3 kg 78.1 kg 74.6 kg    Examination: General: Acute on chronically ill appearing elderly male lying in bed, in NAD HEENT: [place]/AT, MM pink/moist, PERRL,  Neuro: Alert and interactive with some underlying confusion CV: s1s2 regular rate and rhythm, no murmur, rubs, or gallops,  PULM:  Clear to ascultation, no added breath sounds  GI: soft, bowel sounds active in all 4 quadrants, non-tender, non-distended, tolerating TF Extremities: warm/dry, no edema  Skin: no rashes or lesions  Resolved Hospital Problem list     Assessment & Plan:  Acute encephalopathy -Unable to rule out infectious etiology at this time P: Neurology evaluated patient 8/15 recommended repeat MRI, negative as above We will attempt to correct INR and platelet level once appropriate get attempted bedside LP Maintain neuro protective measures; goal for eurothermia, euglycemia, eunatermia, normoxia, and PCO2 goal of 35-40 Nutrition and bowel regiment  Seizure precautions  Aspirations precautions  Might benefit from transfer to [facility] for closer neuro monitoring but beds are limited at this time  EEG negative  Supplement Thiamine  Empiric meningitis coverage   Acute kidney injury  -Creatinine 1.05 GFR greater than 60 on 8/10, Creatinine 1.52 with GFR 5.7 P: Follow renal function  Monitor urine output Trend Bmet Avoid nephrotoxins Ensure adequate renal perfusion   Severe aortic stenosis with bioprosthetic AVR Chronic systolic and diastolic congestive heart   failure -Echocardiogram 8/4 with EF 50% and global hypokinesis with grade 2 diastolic dysfunction with severe stenosis of bioprosthetic aortic valve.  At last admission cardiology  recommended outpatient TAVR work-up -Anticoagulated at baseline with Coumadin Orthostatic hypotension History of paroxysmal A-fib Hx of HTN/HLD P: Continuous telemetry  Hold coumadin in favor of heparin drip  Heart health diet  Strict intake and output  Daily weight to assess volume status Daily assessment for need to diurese with the use of IV lasix  Closely monitor renal function and electrolytes  Ensure hemodynamic control  Continue Midodrine Continue home statin  History of CLL Pancytopenia P: Primary management per oncology Trend CBC Monitor for signs of bleeding Hemoglobin goal greater than 7, Platelet goal > 50 Transfuse per protocol   History of PE  P: Heparin drip as above   Physical deconditioning Care  Best Practice (right click and "Reselect all SmartList Selections" daily)   Diet/type: Regular consistency (see orders) DVT prophylaxis: systemic heparin GI prophylaxis: PPI Lines: N/A Foley:  N/A Code Status:  DNR Last date of multidisciplinary goals of care discussion: Continue to engage patient and family daily   Labs   CBC: Recent Labs  Lab 06/20/22 0352 06/25/2022 1013 06/25/22 0417 06/26/22 0503  WBC 1.9* 3.0* 1.0* 3.8*  NEUTROABS  --  0.7*  --  2.6  HGB 9.6* 11.7* 9.2* 10.2*  HCT 30.0* 34.6* 28.1* 32.2*  MCV 96.5 93.5 96.6 100.6*  PLT 62* 84* 53* 37*    Basic Metabolic Panel: Recent Labs  Lab 06/20/22 0352  1013 06/25/22 0417 06/26/22 0503  NA 141 134* 137 138  K 3.9 4.6 4.6 4.4  CL 114* 103 111 111  CO2 22 23 20* 20*  GLUCOSE 104* 94 95 90  BUN 12 17 18 20  CREATININE 1.05 1.35* 1.29* 1.52*  CALCIUM 8.6* 9.4 8.0* 8.1*  MG 1.9 2.0 1.9 2.0  PHOS 2.7  --   --   --    GFR: Estimated Creatinine Clearance: 42.7 mL/min (A) (by C-G formula based on SCr of 1.52 mg/dL (H)). Recent Labs  Lab 06/20/22 0352 06/26/2022 1013 06/25/22 0417 06/26/22 0503 06/26/22 1106 06/26/22 1358  WBC 1.9* 3.0* 1.0* 3.8*  --   --    LATICACIDVEN  --   --   --   --  1.0 1.0    Liver Function Tests: Recent Labs  Lab 07/03/2022 1013 06/25/22 0417 06/26/22 0503  AST 26 25 33  ALT 37 31 32  ALKPHOS 144* 110 112  BILITOT 0.7 0.5 0.5  PROT 6.2* 5.5* 5.7*  ALBUMIN 4.0 2.9* 3.0*   No results for input(s): "LIPASE", "AMYLASE" in the last 168 hours. Recent Labs  Lab 06/28/2022 1013  AMMONIA 21    ABG    Component Value Date/Time   HCO3 24.7 (H) 04/23/2010 1308   TCO2 28 07/16/2010 1126   ACIDBASEDEF 2.0 04/23/2010 1308   O2SAT 62.0 04/23/2010 1308     Coagulation Profile: Recent Labs  Lab 06/20/22 0352 06/18/2022 1013 06/25/22 0417 06/26/22 0503  INR 3.1* 2.1* 2.2* 2.3*    Cardiac Enzymes: No results for input(s): "CKTOTAL", "CKMB", "CKMBINDEX", "TROPONINI" in the last 168 hours.  HbA1C: Hgb A1c MFr Bld  Date/Time Value Ref Range Status  11/29/2015 09:01 AM 5.7 4.6 - 6.5 % Final    Comment:    Glycemic Control Guidelines for People with Diabetes:Non Diabetic:  <6%Goal of Therapy: <7%Additional Action Suggested:  >8%     CBG: Recent Labs    Lab 06/13/2022 0956  GLUCAP 92    Review of Systems:   Please see the history of present illness. All other systems reviewed and are negative    Past Medical History:  He,  has a past medical history of Anticoagulants causing adverse effect in therapeutic use, Anxiety, Aortic regurgitation (04/2007 surgery), Cervical spondylosis, Chronic lymphocytic leukemia (CLL), B-cell ([place]) (approx 2012), History of prostate cancer (2003), History of pulmonary embolism (2011; 10/2014), Hyperlipidemia, Hypertension, Migraine syndrome, Mitral regurgitation, PAF (paroxysmal atrial fibrillation) ([place]), Panic attacks, and Pulmonary nodule, right (2015).   Surgical History:   Past Surgical History:  Procedure Laterality Date   AORTIC VALVE REPLACEMENT  2011   [place]  (bioprosthetic)   CARDIAC CATHETERIZATION  04/2010   Normal coronaries.   Carotid dopplers  08/2015   NORMAL    CATARACT EXTRACTION     L 12/06/09   R 09/14/09   COLONOSCOPY  01/26/13   tic's, o/w normal.  ([place])--recall 10 yrs.   PENILE PROSTHESIS IMPLANT     PROSTATECTOMY  04/2002   for prostate cancer   Pulmonary nodule resection  Fall/winter 2016   [facility]   SHOULDER SURGERY     rotator cuff on both arms    TEE WITHOUT CARDIOVERSION N/A 07/13/2021   Procedure: TRANSESOPHAGEAL ECHOCARDIOGRAM (TEE);  Surgeon: Pixie Casino, MD;  Location: [facility] ENDOSCOPY;  Service: Cardiovascular;  Laterality: N/A;   TIBIALIS TENDON TRANSFER / REPAIR Right 03/02/2019   Procedure: RECONSTRUCTION OF RIGHT  ANTERIOR TIBIALIS TENDON;  Surgeon: Erle Crocker, MD;  Location: [place];  Service: Orthopedics;  Laterality: Right;   TRANSTHORACIC ECHOCARDIOGRAM  09/20/2014; 03/2016; 03/2017   2015-mild LVH, EF 50-55%, wall motion nl, grade I diast dysfxn, prosth aort valve good,transaortic gradients decreased compared to echo 11/2013.   03/2016: EF 55-60%, normal LV function, severe LVH, normal diast fxn, mild increase in AV gradient compared to 09/2014 echo.  2018: EF 55-60%, normal LV fxn, grd II DD, AV stable/gradient's stable.     Social History:   reports that he quit smoking about 41 years ago. His smoking use included cigarettes. He started smoking about 53 years ago. He has a 12.00 pack-year smoking history. He has never used smokeless tobacco. He reports that he does not currently use alcohol. He reports that he does not use drugs.   Family History:  His family history includes Cancer in his sister; Colon cancer (age of onset: 53) in his sister; Prostate cancer in his father; Stroke in his brother, mother, and sister. There is no history of Heart attack.   Allergies Allergies  Allergen Reactions   Hydrocodone-Acetaminophen Shortness Of Breath   Oxycodone Hcl Shortness Of Breath   Telmisartan-Hctz Shortness Of Breath, Palpitations and Other (See Comments)    Dizziness also   Aspirin Rash and Other  (See Comments)    Confusion, also- Cannot take high doses   Atorvastatin Other (See Comments)    Myalgia    Buspirone Hcl Other (See Comments)    Headaches    Codeine Hives, Nausea And Vomiting and Other (See Comments)    Confusion also   Morphine Sulfate Itching   Paroxetine Other (See Comments)    Paresthesias: R arm, R leg, r side of face   Rabeprazole Other (See Comments)    headache   Ramipril Other (See Comments)    Unknown reaction     Home Medications  Prior to Admission medications   Medication Sig Start Date End Date Taking? Authorizing Provider  acyclovir (ZOVIRAX) 400 MG tablet TAKE 1 TABLET BY MOUTH EVERY DAY Patient taking differently: Take 400 mg by mouth daily. 04/30/22   Ennever, Peter R, MD  Cholecalciferol (VITAMIN D3) 50 MCG (2000 UT) capsule Take 2,000 Units by mouth every morning.    [provider]  [employer] MOTION SICKNESS RELIEF 25 MG CHEW CHEW 1 TABLET EVERY DAY AS NEEDED FOR DIZZINESS 01/18/22   Hunter, Stephen O, MD  docusate sodium (COLACE) 100 MG capsule Take 100 mg by mouth daily.    [provider]  ipratropium (ATROVENT) 0.03 % nasal spray Place 2 sprays into both nostrils every 12 (twelve) hours. Patient taking differently: Place 2 sprays into both nostrils 2 (two) times daily as needed for rhinitis (congestion). 10/04/20   Worley, Samantha, PA  megestrol (MEGACE) 400 MG/10ML suspension Take 10 mLs (400 mg total) by mouth 2 (two) times daily. 06/20/22   Kumar, Pardeep, MD  midodrine (PROAMATINE) 2.5 MG tablet Take 1 tablet (2.5 mg total) by mouth 3 (three) times daily with meals. 06/20/22   Kumar, Pardeep, MD  omeprazole (PRILOSEC) 40 MG capsule Take 40 mg by mouth every evening. 10/18/20   [provider]  ondansetron (ZOFRAN) 8 MG tablet Take 1 tablet (8 mg total) by mouth 2 (two) times daily as needed for refractory nausea / vomiting. Start on day 2 after bendamustine chemo. 01/03/22   Ennever, Peter R, MD  prochlorperazine  (COMPAZINE) 10 MG tablet Take 1 tablet (10 mg total) by mouth every 6 (six) hours as needed (Nausea or vomiting). 01/03/22   Ennever, Peter R, MD  rosuvastatin (CRESTOR) 10 MG tablet Take 10 mg by mouth daily.    [provider]  sertraline (ZOLOFT) 100 MG tablet Take 100 mg by mouth every morning.    [provider]  sodium chloride (OCEAN) 0.65 % SOLN nasal spray Place 1 spray into both nostrils as needed for congestion.    [provider]  warfarin (COUMADIN) 5 MG tablet TAKE AS DIRECTED BY COUMADIN CLINIC 09/17/21   Nishan, Peter C, MD     Critical care time: 42 mins    D. Harris, NP-C [facility] Personal contact information can be found on Amion  06/26/2022, 3:48 PM       

## 2022-06-26 NOTE — Progress Notes (Signed)
   06/26/22 1026  Assess: MEWS Score  Temp (!) 101.7 F (38.7 C)  BP 99/61  MAP (mmHg) 73  Pulse Rate 92  Resp (!) 24  SpO2 98 %  O2 Device Room Air  Assess: MEWS Score  MEWS Temp 2  MEWS Systolic 1  MEWS Pulse 0  MEWS RR 1  MEWS LOC 0  MEWS Score 4  MEWS Score Color Red  Assess: if the MEWS score is Yellow or Red  Were vital signs taken at a resting state? Yes  Focused Assessment No change from prior assessment  Does the patient meet 2 or more of the SIRS criteria? Yes  Does the patient have a confirmed or suspected source of infection? No  Provider and Rapid Response Notified? Yes  MEWS guidelines implemented *See Row Information* No, previously red, continue vital signs every 4 hours  Treat  MEWS Interventions Administered prn meds/treatments  Pain Scale 0-10  Pain Score 0  Complains of Fever  Interventions Medication (see MAR)  Take Vital Signs  Increase Vital Sign Frequency  Red: Q 1hr X 4 then Q 4hr X 4, if remains red, continue Q 4hrs  Escalate  MEWS: Escalate Red: discuss with charge nurse/RN and provider, consider discussing with RRT  Notify: Charge Nurse/RN  Name of Charge Nurse/RN Notified Brien Mates  Date Charge Nurse/RN Notified 06/26/22  Time Charge Nurse/RN Notified 1042  Notify: Provider  Provider Name/Title [place]  Date Provider Notified 06/26/22  Time Provider Notified 13  Method of Notification Page  Notification Reason Change in status (RED mews)  Provider response No new orders  Date of Provider Response 06/26/22  Time of Provider Response 1043  Notify: Rapid Response  Name of Rapid Response RN Notified Amedeo Plenty RN  Date Rapid Response Notified 06/26/22  Time Rapid Response Notified 4854  Document  Progress note created (see row info) Yes  Assess: SIRS CRITERIA  SIRS Temperature  1  SIRS Pulse 1  SIRS Respirations  1  SIRS WBC 1  SIRS Score Sum  4

## 2022-06-26 NOTE — Progress Notes (Signed)
Patient's IVs not working properly, IV consult placed for assistance.

## 2022-06-26 NOTE — Progress Notes (Signed)
Patient has one working IV; MD notified.Orders received to administer medications in the following order: vitamin K, ampicillin, platelets, thiamine, heparin.

## 2022-06-26 NOTE — Progress Notes (Addendum)
Daily Progress Note   Patient Name: [name] [facility]       Date: 06/26/2022 DOB: 1946-05-27  Age: 76 y.o. MRN#: [id number] Attending Physician: Oswald Hillock, MD Primary Care Physician: [name], MD Admit Date: 07/05/2022 Length of Stay: 1 day  Reason for Consultation/Follow-up: Establishing goals of care  HPI/Patient Profile:  [name] is a 76 y.o. male with medical history significant of CLL,  bioprosthetic aortic valve replacement on Coumadin, CKD stage IIIb,recurrent PE on Coumadin, of paroxysmal atrial fibrillation, recent hospitalization from 06/14/2022 - 06/20/2022 for altered mental status, AKI and neutropenia.  He is being follow by [name] of Oncology.    Palliative care has been asked to get involved to discuss goals of care in the setting of multiple co-morbidities and known CLL.   Subjective:   Subjective: Chart Reviewed. Updates received. Patient Assessed. Created space and opportunity for patient  and family to explore thoughts and feelings regarding current medical situation.  Today's Discussion: Today I met the patient at the bedside. He is oriented x 2, but unable to tell me why he is in the hospital and therefore lacks capacity to make his healthcare decisions. When I told him his niece Rod Holler is coming to visit he smiled and said that makes him very happy.  Sometime later I received a message that the patient's niece Rod Holler was at the bedside. She brought what she thought was HCPOA paperwork, but it was durable POA paperwork. I educated her on the differences. She's not sure if she has HCPOA papers, but couldn't find any today. However, the patient is not married, has no children, no guardian and his only sister is quite ill and receives constant care herself. Rod Holler and her sister Bertram Millard are the only reasonable available family members and therefore I feel confident in their status as surrogate decision maker.  We discussed her trust in [name] and his care plan  and plan for work-up of his mental status. She feels he is more "foggy" than he was last admission (d/c about 4 days prior to this admission). We discussed multiple possible etiologies. I shared that his MRI Brain had no acute process and that there's plans for an EEG today to evaluate for any seizure activity. Ultimately [name] wants to do a spinal tap when the patient's plt count is safe (currently receiving Neupogen and a dose of Nplate).  She shares that she knows he is sick. She is hopeful for an answer for his AMS and is committed to providing care for him. However, she understands that eventually he may end up hospice appropriate and we agree that we can have that discussion at that time. She would like to continue work-up and full scope of treatment, with the exception of remaining DNR.  I provided emotional and general support through therapeutic listening, empathy, sharing of stories, and other techniques. I answered all questions and addressed all concerns to the best of my ability.  Review of Systems  Constitutional:  Positive for appetite change.       Denies pain in general  Gastrointestinal:  Negative for abdominal pain, nausea and vomiting.    Objective:   Vital Signs:  BP 99/61 (BP Location: Right Arm)   Pulse 92   Temp (!) 101.7 F (38.7 C) (Oral)   Resp (!) 24   Ht $R'5\' 10"'Ob$  (1.778 m)   Wt 78.1 kg   SpO2 98%   BMI 24.71 kg/m   Physical Exam: Physical  Exam Vitals and nursing note reviewed.  Constitutional:      General: He is not in acute distress.    Appearance: He is ill-appearing.  HENT:     Head: Normocephalic and atraumatic.  Pulmonary:     Effort: Pulmonary effort is normal. No respiratory distress.  Skin:    General: Skin is warm and dry.  Neurological:     Mental Status: He is disoriented and confused.  Psychiatric:        Mood and Affect: Mood normal.        Behavior: Behavior normal.     Palliative Assessment/Data: 30%   Assessment &  Plan:   Impression: Present on Admission:  Acute metabolic encephalopathy  Recurrent pulmonary embolism (HCC)  Paroxysmal atrial fibrillation (HCC)  Pancytopenia (HCC)  Orthostatic hypotension  AKI (acute kidney injury) ([place])  Chronic diastolic CHF (congestive heart failure) (HCC)  Chronic lymphocytic leukemia (HCC)  HLD (hyperlipidemia)  History of pulmonary embolism  76 year old male with CLL (care led by [name] of Oncology) who is admitted with altered mental status. So far no identified reason for his confusion. Pending EEG today, planning spinal tap once plt counts are safe. Patient's surrogate decision maker is his niece Rod Holler. She continues to have trust in [name] and wants to proceed with workup. She understands he may not be able to "go home" but she is working on options to provide 24/7 care for him in her home. Overall long-term prognosis is poor but continued hope for improvement.  SUMMARY OF RECOMMENDATIONS   Remain DNR Continue with planned work-up for AMS Continue to hope for improvement Take it a day at a time Allow time for work-up and outcomes Further GOC discussions based on evolution of clinical status PMT will follow-up in a couple days after time for w/up and outcomes Please notify us of any significant clinical change or new needs  Symptom Management:  Per primary team PMT is available to assist as needed  Code Status: DNR  Prognosis: Unable to determine  Discharge Planning: To Be Determined  Discussed with: Patient, patient's family, medical team, nursing team  Thank you for allowing [name] to participate in the care of Marvelle Caudill Greenblatt PMT will continue to support holistically.  Time Total: 60 min  Visit consisted of counseling and education dealing with the complex and emotionally intense issues of symptom management and palliative care in the setting of serious and potentially life-threatening illness. Greater than 50%  of this time was spent  counseling and coordinating care related to the above assessment and plan.  Walden Field, NP Palliative Medicine Team  Team Phone # [phone] (Nights/Weekends)  07/10/2021, 8:17 AM

## 2022-06-26 NOTE — Progress Notes (Signed)
EEG complete - results pending 

## 2022-06-26 NOTE — Progress Notes (Addendum)
[facility]  PROGRESS NOTE  [name] ZOX:[id number] DOB: [birth year] DOA: 06/25/2022 PCP: [name], MD   Brief HPI:    76 y.o. male with medical history significant of CLL,  bioprosthetic aortic valve replacement on Coumadin, CKD stage IIIb,recurrent PE on Coumadin, of paroxysmal atrial fibrillation, recent hospitalization from 06/14/2022 - 06/20/2022 for altered mental status, AKI and neutropenia.  During this hospitalization, oncology was consulted and patient underwent bone marrow biopsy; treated with Granix for neutropenia by oncology and recommended outpatient follow-up with oncology.  Ammonia, B12 and TSH levels were normal.  MRI of brain was unrevealing; there was a question of seizure-like activity while getting an MRI of brain; EEG was negative for seizures; neurology evaluated the patient and subsequently signed off.  This was attributed to orthostatic hypotension and patient was prescribed midodrine.  He was subsequently discharged to SNF on 06/20/2022. He presented on 06/15/2022 to oncology/Dr. Antonieta Pert office for follow-up.  He was found in the parking lot unresponsive at [facility] and subsequently taken to the ED. in the ED,  His mental status improved after intranasal administration of Narcan.  He subsequently remained somnolent and confused.  CT of the head was negative for any acute intracranial abnormity.    Subjective   This morning patient developed fever with temperature one 101.7, was hypotensive.  To 50 cc normal saline bolus x2 was given with some improvement of blood pressure patient transferred to stepdown unit.  Patient was initially started on meropenem which was changed to ceftazidime.   Assessment/Plan:    Acute encephalopathy -Likely metabolic encephalopathy versus toxic encephalopathy -?  Syncope from orthostatic hypotension -?  Seizure, work-up including MRI brain and EEG was negative -Patient continues to have encephalopathy, he  will need lumbar puncture -Limit puncture has been on hold due to persistent thrombocytopenia and coagulopathy with INR 2.3, patient is on IV heparin for pulmonary embolism/atrial fibrillation -Discussed with [name], will discontinue IV heparin and give platelet transfusion in effort to improve platelet count and get IR to perform lumbar puncture -Neurology has signed off  Fever/?  Sepsis -Patient was neutropenic yesterday, it has resolved after patient received Neupogen -Patient was started on ceftazidime this morning, meropenem was discontinued prophylactically to prevent seizure as it can lower seizure threshold -I will add vancomycin and ampicillin to treat for possible meningitis -Patient received 500 cc normal saline bolus on medical floor, will  transfer to stepdown unit -Continue IV normal saline at 125 mill per hour For-follow blood culture results, lactic acid was 1.0 -Discussed with ID Dr. Candiss Norse , curbside; she recommended to continue treatment for meningitis and try to obtain lumbar puncture  Acute kidney injury -Patient creatinine on presentation was 1.05 on 06/20/2022 -Creatinine is now elevated at 1.52 likely from ongoing hypotension -Started on normal saline at 125 mL/h -BMP in am  Hyponatremia -Resolved  History of CLL/pancytopenia -Oncology following -Pancytopenia improved after patient received Neupogen  Orthostatic hypotension -Unclear etiology, patient recently had extensive work-up during previous hospitalization -Continues to have low blood pressure -Continue midodrine 2.5 mg 3 times daily -Also blood pressure low likely in setting of sepsis as above, started on antibiotics  History of pulmonary embolism/paroxysmal atrial fibrillation -He was on Coumadin, which was switched to heparin -We will discontinue heparin  Acute urinary retention -Foley cath was placed during this admission; which were discontinued -Continue and out catheterization if  needed  Chronic diastolic heart failure -Recent TTE showed LVEF of 50% with grade  2 diastolic dysfunction/severe aortic stenosis -Currently compensated -Not on diuretics at home  Goals of care -Perative care consulted for goals of care -Patient is DNR/DNI    Medications     acyclovir  400 mg Oral Daily   Chlorhexidine Gluconate Cloth  6 each Topical Daily   cholecalciferol  2,000 Units Oral q morning   docusate sodium  100 mg Oral Daily   midodrine  2.5 mg Oral TID WC   mirtazapine  15 mg Oral QHS   multivitamin with minerals  1 tablet Oral Daily   rosuvastatin  5 mg Oral Daily   sertraline  100 mg Oral q morning   [START ON 07/03/2022] thiamine (VITAMIN B1) injection  100 mg Intravenous Daily     Data Reviewed:   CBG:  Recent Labs  Lab 06/29/2022 0956  GLUCAP 92    SpO2: 96 %    Vitals:   06/26/22 1156 06/26/22 1249 06/26/22 1419 06/26/22 1430  BP: (!) 70/50 (!) 80/52 (!) 86/50 (!) 84/52  Pulse: 90 87 83   Resp:  20  (!) 21  Temp: (!) 100.7 F (38.2 C) (!) 100.5 F (38.1 C) 98.8 F (37.1 C)   TempSrc: Oral Oral Oral   SpO2: 95% 96%    Weight:   74.6 kg   Height:   '5\' 10"'$  (1.778 m)       Data Reviewed:  Basic Metabolic Panel: Recent Labs  Lab 06/20/22 0352 07/06/2022 1013 06/25/22 0417 06/26/22 0503  NA 141 134* 137 138  K 3.9 4.6 4.6 4.4  CL 114* 103 111 111  CO2 22 23 20* 20*  GLUCOSE 104* 94 95 90  BUN $Re'12 17 18 20  'cMq$ CREATININE 1.05 1.35* 1.29* 1.52*  CALCIUM 8.6* 9.4 8.0* 8.1*  MG 1.9 2.0 1.9 2.0  PHOS 2.7  --   --   --     CBC: Recent Labs  Lab 06/20/22 0352 06/13/2022 1013 06/25/22 0417 06/26/22 0503  WBC 1.9* 3.0* 1.0* 3.8*  NEUTROABS  --  0.7*  --  2.6  HGB 9.6* 11.7* 9.2* 10.2*  HCT 30.0* 34.6* 28.1* 32.2*  MCV 96.5 93.5 96.6 100.6*  PLT 62* 84* 53* 37*    LFT Recent Labs  Lab 07/10/2022 1013 06/25/22 0417 06/26/22 0503  AST 26 25 33  ALT 37 31 32  ALKPHOS 144* 110 112  BILITOT 0.7 0.5 0.5  PROT 6.2* 5.5* 5.7*   ALBUMIN 4.0 2.9* 3.0*     Antibiotics: Anti-infectives (From admission, onward)    Start     Dose/Rate Route Frequency Ordered Stop   06/27/22 1200  vancomycin (VANCOCIN) IVPB 1000 mg/200 mL premix        1,000 mg 200 mL/hr over 60 Minutes Intravenous Every 24 hours 06/26/22 1217     06/26/22 2000  meropenem (MERREM) 1 g in sodium chloride 0.9 % 100 mL IVPB  Status:  Discontinued        1 g 200 mL/hr over 30 Minutes Intravenous Every 12 hours 06/26/22 0717 06/26/22 1206   06/26/22 1800  cefTAZidime (FORTAZ) 2 g in sodium chloride 0.9 % 100 mL IVPB        2 g 200 mL/hr over 30 Minutes Intravenous Every 12 hours 06/26/22 1208     06/26/22 1600  ampicillin (OMNIPEN) 2 g in sodium chloride 0.9 % 100 mL IVPB        2 g 300 mL/hr over 20 Minutes Intravenous Every 6 hours 06/26/22 1408  06/26/22 1200  vancomycin (VANCOREADY) IVPB 1500 mg/300 mL        1,500 mg 150 mL/hr over 120 Minutes Intravenous  Once 06/26/22 1107 06/26/22 1353   06/25/22 1000  acyclovir (ZOVIRAX) tablet 400 mg        400 mg Oral Daily 07/11/2022 1358     06/25/22 0845  meropenem (MERREM) 1 g in sodium chloride 0.9 % 100 mL IVPB  Status:  Discontinued        1 g 200 mL/hr over 30 Minutes Intravenous Every 8 hours 06/25/22 0758 06/26/22 0717        DVT prophylaxis: Heparin  Code Status: DNR  Family Communication: No family at bedside   CONSULTS neurology, oncology   Objective    Physical Examination:   General: Appears lethargic Cardiovascular: S1-S2, regular, no murmur auscultated Respiratory: Clear to auscultation bilaterally Abdomen: Abdomen is soft, nontender, no organomegaly Extremities: No edema in the lower extremities Neurologic: Alert, easily distracted, incoherent speech, moving all extremities, following commands   Status is: Inpatient: Encephalopathy        [place]   [facility] If 7PM-7AM, please contact night-coverage at [email or web address], Office   [phone]   06/26/2022, 2:35 PM  LOS: 1 day

## 2022-06-26 NOTE — Progress Notes (Signed)
[name] is certainly more awake.  He is although cognitive.  He really has a hard time putting thoughts together.  He had an MRI of the brain yesterday which was unremarkable.  He is on heparin right now.  His platelet count is still low at 37,000.  His white cell count is up at 3.8.  Again, I think we really need to get a spinal tap on him.  However, this is not can be done until his platelet count gets above 70,000-80,000.  I does not know if he is eating much.  I doubt that he really is eating much.  Again this confusion is really bothersome.  His speech is certainly pressured.  His BUN is 20 creatinine 1.52.  Calcium is 8.1 with an albumin of 3.  His potassium is 4.4.  His sodium is 138.  His hemoglobin is 10.2.  Again, I really doubt that he is eating much.  I am not sure how much she can really do with physical therapy.  There is just no way he will be able to go home in my opinion.  I would think he is going to have to make a lot of improvement.  If he goes home, he is going need 24-hour care at home.  So far, his cultures are all negative.  He is still having some low-grade temperatures.  His vital signs are temperature 100.2.  Pulse 98.  Blood pressure 123/69.  His oral exam does not show any thrush.  Lungs are clear bilaterally.  Cardiac exam regular rate and rhythm.  Abdomen is soft.  There is no guarding or rebound tenderness.  There is no palpable hepatosplenomegaly.  Extremities shows no clubbing, cyanosis or edema.  He does have a tremor.  Neurological exam does show the cognitive deficits.  He really does not have much coherent speech.  Again, I ultimately think he is going to need a spinal tap.  Again, we have to await his blood count,.  He got a dose of Nplate.  As expected, he has responded to the Neupogen.  I am not sure how we can really get him to eat more.  I do still think that he realizes that he is not eating much.  I know that this is incredibly  complicated.  I cannot imagine that he is able to go home anytime soon.  I forgot to mention he is on the heparin infusion while off the Coumadin for anticoagulation with the atrial fibrillation.  Lattie Haw, MD  2 Chronicles 7:14

## 2022-06-27 ENCOUNTER — Inpatient Hospital Stay (HOSPITAL_COMMUNITY): Payer: No Typology Code available for payment source

## 2022-06-27 DIAGNOSIS — D61818 Other pancytopenia: Secondary | ICD-10-CM | POA: Diagnosis not present

## 2022-06-27 DIAGNOSIS — G9340 Encephalopathy, unspecified: Secondary | ICD-10-CM | POA: Diagnosis not present

## 2022-06-27 DIAGNOSIS — N179 Acute kidney failure, unspecified: Secondary | ICD-10-CM | POA: Diagnosis not present

## 2022-06-27 DIAGNOSIS — R4182 Altered mental status, unspecified: Secondary | ICD-10-CM | POA: Diagnosis not present

## 2022-06-27 DIAGNOSIS — G9341 Metabolic encephalopathy: Secondary | ICD-10-CM | POA: Diagnosis not present

## 2022-06-27 DIAGNOSIS — I951 Orthostatic hypotension: Secondary | ICD-10-CM | POA: Diagnosis not present

## 2022-06-27 DIAGNOSIS — Z86711 Personal history of pulmonary embolism: Secondary | ICD-10-CM | POA: Diagnosis not present

## 2022-06-27 LAB — CBC WITH DIFFERENTIAL/PLATELET
Abs Immature Granulocytes: 0.07 10*3/uL (ref 0.00–0.07)
Basophils Absolute: 0 10*3/uL (ref 0.0–0.1)
Basophils Relative: 2 %
Eosinophils Absolute: 0 10*3/uL (ref 0.0–0.5)
Eosinophils Relative: 0 %
HCT: 26.7 % — ABNORMAL LOW (ref 39.0–52.0)
Hemoglobin: 9 g/dL — ABNORMAL LOW (ref 13.0–17.0)
Immature Granulocytes: 3 %
Lymphocytes Relative: 12 %
Lymphs Abs: 0.3 10*3/uL — ABNORMAL LOW (ref 0.7–4.0)
MCH: 31.9 pg (ref 26.0–34.0)
MCHC: 33.7 g/dL (ref 30.0–36.0)
MCV: 94.7 fL (ref 80.0–100.0)
Monocytes Absolute: 0.1 10*3/uL (ref 0.1–1.0)
Monocytes Relative: 4 %
Neutro Abs: 1.8 10*3/uL (ref 1.7–7.7)
Neutrophils Relative %: 79 %
Platelets: 37 10*3/uL — ABNORMAL LOW (ref 150–400)
RBC: 2.82 MIL/uL — ABNORMAL LOW (ref 4.22–5.81)
RDW: 14.5 % (ref 11.5–15.5)
WBC: 2.3 10*3/uL — ABNORMAL LOW (ref 4.0–10.5)
nRBC: 0 % (ref 0.0–0.2)

## 2022-06-27 LAB — PREPARE PLATELET PHERESIS
Unit division: 0
Unit division: 0

## 2022-06-27 LAB — COMPREHENSIVE METABOLIC PANEL
ALT: 27 U/L (ref 0–44)
AST: 41 U/L (ref 15–41)
Albumin: 2.7 g/dL — ABNORMAL LOW (ref 3.5–5.0)
Alkaline Phosphatase: 116 U/L (ref 38–126)
Anion gap: 7 (ref 5–15)
BUN: 21 mg/dL (ref 8–23)
CO2: 16 mmol/L — ABNORMAL LOW (ref 22–32)
Calcium: 7.9 mg/dL — ABNORMAL LOW (ref 8.9–10.3)
Chloride: 114 mmol/L — ABNORMAL HIGH (ref 98–111)
Creatinine, Ser: 1.43 mg/dL — ABNORMAL HIGH (ref 0.61–1.24)
GFR, Estimated: 51 mL/min — ABNORMAL LOW (ref >60)
Glucose, Bld: 99 mg/dL (ref 70–99)
Potassium: 3.7 mmol/L (ref 3.5–5.1)
Sodium: 137 mmol/L (ref 135–145)
Total Bilirubin: 0.9 mg/dL (ref 0.3–1.2)
Total Protein: 5.1 g/dL — ABNORMAL LOW (ref 6.5–8.1)

## 2022-06-27 LAB — BPAM PLATELET PHERESIS
Blood Product Expiration Date: 202308172359
Blood Product Expiration Date: 202308172359
ISSUE DATE / TIME: 202308161946
ISSUE DATE / TIME: 202308162147
Unit Type and Rh: 6200
Unit Type and Rh: 6200

## 2022-06-27 LAB — PROTIME-INR
INR: 1.5 — ABNORMAL HIGH (ref 0.8–1.2)
Prothrombin Time: 17.8 seconds — ABNORMAL HIGH (ref 11.4–15.2)

## 2022-06-27 LAB — HEPARIN LEVEL (UNFRACTIONATED)
Heparin Unfractionated: 0.49 IU/mL (ref 0.30–0.70)
Heparin Unfractionated: 0.73 IU/mL — ABNORMAL HIGH (ref 0.30–0.70)

## 2022-06-27 LAB — MAGNESIUM: Magnesium: 1.7 mg/dL (ref 1.7–2.4)

## 2022-06-27 MED ORDER — DOXYCYCLINE HYCLATE 100 MG PO TABS
100.0000 mg | ORAL_TABLET | Freq: Two times a day (BID) | ORAL | Status: DC
Start: 2022-06-27 — End: 2022-06-28
  Administered 2022-06-27 (×2): 100 mg via ORAL
  Filled 2022-06-27 (×2): qty 1

## 2022-06-27 MED ORDER — TBO-FILGRASTIM 480 MCG/0.8ML ~~LOC~~ SOSY
480.0000 ug | PREFILLED_SYRINGE | Freq: Once | SUBCUTANEOUS | Status: AC
  Administered 2022-06-27: 480 ug via SUBCUTANEOUS
  Filled 2022-06-27: qty 0.8

## 2022-06-27 MED ORDER — LORAZEPAM 2 MG/ML IJ SOLN
0.5000 mg | Freq: Once | INTRAMUSCULAR | Status: AC
  Administered 2022-06-27: 0.5 mg via INTRAVENOUS
  Filled 2022-06-27: qty 1

## 2022-06-27 MED ORDER — LACTATED RINGERS IV BOLUS
500.0000 mL | Freq: Once | INTRAVENOUS | Status: AC
  Administered 2022-06-27: 500 mL via INTRAVENOUS

## 2022-06-27 MED ORDER — SODIUM CHLORIDE 0.9% FLUSH: 10.0000 mL | INTRAVENOUS | Status: DC | PRN

## 2022-06-27 MED ORDER — AMIODARONE LOAD VIA INFUSION
150.0000 mg | Freq: Once | INTRAVENOUS | Status: DC
  Filled 2022-06-27: qty 83.34

## 2022-06-27 MED ORDER — METOPROLOL TARTRATE 5 MG/5ML IV SOLN
2.5000 mg | INTRAVENOUS | Status: DC | PRN
  Administered 2022-06-27 (×4): 2.5 mg via INTRAVENOUS
  Filled 2022-06-27 (×4): qty 5

## 2022-06-27 MED ORDER — PHENYLEPHRINE HCL-NACL 20-0.9 MG/250ML-% IV SOLN
0.0000 ug/min | INTRAVENOUS | Status: DC
  Administered 2022-06-27: 25 ug/min via INTRAVENOUS
  Administered 2022-06-28 (×2): 175 ug/min via INTRAVENOUS
  Administered 2022-06-28: 100 ug/min via INTRAVENOUS
  Administered 2022-06-28: 90 ug/min via INTRAVENOUS
  Administered 2022-06-28: 25 ug/min via INTRAVENOUS
  Administered 2022-06-28: 90 ug/min via INTRAVENOUS
  Filled 2022-06-27: qty 500
  Filled 2022-06-27 (×5): qty 250

## 2022-06-27 MED ORDER — AMIODARONE HCL IN DEXTROSE 360-4.14 MG/200ML-% IV SOLN: 30.0000 mg/h | INTRAVENOUS | Status: DC

## 2022-06-27 MED ORDER — SODIUM CHLORIDE 0.9% FLUSH
10.0000 mL | Freq: Two times a day (BID) | INTRAVENOUS | Status: DC
  Administered 2022-06-27 – 2022-07-02 (×10): 10 mL

## 2022-06-27 MED ORDER — LEVALBUTEROL HCL 0.63 MG/3ML IN NEBU
0.6300 mg | INHALATION_SOLUTION | Freq: Four times a day (QID) | RESPIRATORY_TRACT | Status: DC | PRN
  Administered 2022-06-27: 0.63 mg via RESPIRATORY_TRACT
  Filled 2022-06-27: qty 3

## 2022-06-27 MED ORDER — AMIODARONE HCL IN DEXTROSE 360-4.14 MG/200ML-% IV SOLN: 60.0000 mg/h | INTRAVENOUS | Status: DC

## 2022-06-27 MED ORDER — MAGNESIUM SULFATE 2 GM/50ML IV SOLN
2.0000 g | Freq: Once | INTRAVENOUS | Status: AC
  Administered 2022-06-27: 2 g via INTRAVENOUS
  Filled 2022-06-27: qty 50

## 2022-06-27 MED ORDER — SODIUM CHLORIDE 0.9 % IV SOLN
250.0000 mL | INTRAVENOUS | Status: DC
  Administered 2022-06-27: 250 mL via INTRAVENOUS

## 2022-06-27 MED ORDER — SODIUM CHLORIDE 0.9 % IV SOLN
2.0000 g | Freq: Four times a day (QID) | INTRAVENOUS | Status: DC
  Administered 2022-06-27 – 2022-06-28 (×4): 2 g via INTRAVENOUS
  Filled 2022-06-27 (×4): qty 2000

## 2022-06-27 MED ORDER — LORAZEPAM 2 MG/ML IJ SOLN
0.5000 mg | Freq: Once | INTRAMUSCULAR | Status: AC
Start: 2022-06-27 — End: 2022-06-27
  Administered 2022-06-27: 0.5 mg via INTRAVENOUS
  Filled 2022-06-27: qty 1

## 2022-06-27 NOTE — Progress Notes (Signed)
[facility]  PROGRESS NOTE  [name]:[id number] DOB: 10-10-1946 DOA: 06/30/2022 PCP: [name], MD   Brief HPI:    76 y.o. male with medical history significant of CLL,  bioprosthetic aortic valve replacement on Coumadin, CKD stage IIIb,recurrent PE on Coumadin, of paroxysmal atrial fibrillation, recent hospitalization from 06/14/2022 - 06/20/2022 for altered mental status, AKI and neutropenia.  During this hospitalization, oncology was consulted and patient underwent bone marrow biopsy; treated with Granix for neutropenia by oncology and recommended outpatient follow-up with oncology.  Ammonia, B12 and TSH levels were normal.  MRI of brain was unrevealing; there was a question of seizure-like activity while getting an MRI of brain; EEG was negative for seizures; neurology evaluated the patient and subsequently signed off.  This was attributed to orthostatic hypotension and patient was prescribed midodrine.  He was subsequently discharged to SNF on 06/20/2022. He presented on 06/23/2022 to oncology/Dr. Antonieta Pert office for follow-up.  He was found in the parking lot unresponsive at [facility] and subsequently taken to the ED. in the ED,  His mental status improved after intranasal administration of Narcan.  He subsequently remained somnolent and confused.  CT of the head was negative for any acute intracranial abnormity.    Subjective   Patient seen and examined, moved to stepdown unit last night.  Still having fever.  Awaiting ICU bed at [facility] for transfer for possible spinal tap to be done by neurology at [facility].  This morning patient went into SVT, resolved after 1 dose of metoprolol 2.5 mg IV   Assessment/Plan:    Acute encephalopathy -Likely metabolic encephalopathy versus toxic encephalopathy -?  Syncope from orthostatic hypotension -?  Seizure, work-up including MRI brain and EEG was negative -Patient continues to have encephalopathy, he will need lumbar  puncture -Limit puncture has been on hold due to persistent thrombocytopenia and coagulopathy with INR 2.3, patient is on IV heparin for pulmonary embolism/atrial fibrillation -Discussed with [name], patient received  platelet transfusion in effort to improve platelet count  -Neurology recommends to transfer to [facility] to attempt lumbar puncture at [facility].  They will not be will to perform LP at [facility] long -IR was consulted however IR do not feel that they can do lumbar puncture here as neurology or PCCM has 2 attempted first and they would like to limit radiation. -Patient was also given vitamin K 5 mg IV yesterday as INR was elevated.  Today INR is 1.5.  Platelet count is 37,000  Fever/?  Sepsis -Patient was neutropenic yesterday, it has resolved after patient received Neupogen -Patient was started on ceftazidime this morning, meropenem was discontinued prophylactically to prevent seizure as it can lower seizure threshold -I will add vancomycin and ampicillin to treat for possible meningitis -Patient received 500 cc normal saline bolus on medical floor, will  transfer to stepdown unit -Continue IV normal saline at 125 mill per hour For-follow blood culture results, lactic acid was 1.0 -We will obtain official ID consultation  Acute kidney injury -Patient creatinine on presentation was 1.05 on 06/20/2022 -Creatinine is now elevated at 1.43 likely from ongoing hypotension -Started on normal saline at 125 mL/h -BMP in am  Hyponatremia -Resolved  History of CLL/pancytopenia -Oncology following -Pancytopenia improved after patient received Neupogen  Orthostatic hypotension -Unclear etiology, patient recently had extensive work-up during previous hospitalization -Continues to have low blood pressure -Continue midodrine 2.5 mg 3 times daily -Also blood pressure low likely in setting of sepsis as above,  started on antibiotics  History of pulmonary embolism/paroxysmal atrial  fibrillation -He was on Coumadin, which was switched to heparin -We will discontinue heparin, before attempting LP  Acute urinary retention -Foley cath was placed during this admission; which were discontinued -Continue in  and out catheterization if needed  Chronic diastolic heart failure -Recent TTE showed LVEF of 50% with grade 2 diastolic dysfunction/severe aortic stenosis -Currently compensated -Not on diuretics at home  Goals of care -Perative care consulted for goals of care -Patient is DNR/DNI    Medications     acyclovir  400 mg Oral Daily   amiodarone  150 mg Intravenous Once   Chlorhexidine Gluconate Cloth  6 each Topical Daily   cholecalciferol  2,000 Units Oral q morning   docusate sodium  100 mg Oral Daily   doxycycline  100 mg Oral Q12H   midodrine  2.5 mg Oral TID WC   mirtazapine  15 mg Oral QHS   multivitamin with minerals  1 tablet Oral Daily   rosuvastatin  5 mg Oral Daily   sertraline  100 mg Oral q morning   sodium chloride flush  10-40 mL Intracatheter Q12H   [START ON 07/03/2022] thiamine (VITAMIN B1) injection  100 mg Intravenous Daily     Data Reviewed:   CBG:  Recent Labs  Lab 06/12/2022 0956  GLUCAP 92    SpO2: 95 % O2 Flow Rate (L/min): 2 L/min    Vitals:   06/27/22 1419 06/27/22 1427 06/27/22 1500 06/27/22 1515  BP: 100/74  (!) 70/34 (!) 80/43  Pulse: 86  84 77  Resp: (!) 24  (!) 23 (!) 23  Temp: (!) 101.8 F (38.8 C) (!) 102.6 F (39.2 C)    TempSrc: Axillary Rectal    SpO2: 95%  93% 95%  Weight:      Height:          Data Reviewed:  Basic Metabolic Panel: Recent Labs  Lab 06/15/2022 1013 06/25/22 0417 06/26/22 0503 06/27/22 0444  NA 134* 137 138 137  K 4.6 4.6 4.4 3.7  CL 103 111 111 114*  CO2 23 20* 20* 16*  GLUCOSE 94 95 90 99  BUN _0 CREATININE 1.35* 1.29* 1.52* 1.43*  CALCIUM 9.4 8.0* 8.1* 7.9*  MG 2.0 1.9 2.0 1.7    CBC: Recent Labs  Lab 06/12/2022 1013 06/25/22 0417 06/26/22 0503  06/27/22 0444  WBC 3.0* 1.0* 3.8* 2.3*  NEUTROABS 0.7*  --  2.6 1.8  HGB 11.7* 9.2* 10.2* 9.0*  HCT 34.6* 28.1* 32.2* 26.7*  MCV 93.5 96.6 100.6* 94.7  PLT 84* 53* 37* 37*    LFT Recent Labs  Lab 06/22/2022 1013 06/25/22 0417 06/26/22 0503 06/27/22 0444  AST 26 25 33 41  ALT 37 31 32 27  ALKPHOS 144* 110 112 116  BILITOT 0.7 0.5 0.5 0.9  PROT 6.2* 5.5* 5.7* 5.1*  ALBUMIN 4.0 2.9* 3.0* 2.7*     Antibiotics: Anti-infectives (From admission, onward)    Start     Dose/Rate Route Frequency Ordered Stop   06/27/22 1700  ampicillin (OMNIPEN) 2 g in sodium chloride 0.9 % 100 mL IVPB        2 g 300 mL/hr over 20 Minutes Intravenous Every 6 hours 06/27/22 1332     06/27/22 1500  doxycycline (VIBRA-TABS) tablet 100 mg        100 mg Oral Every 12 hours 06/27/22 1336     06/27/22 1200  vancomycin (VANCOCIN) IVPB  1000 mg/200 mL premix        1,000 mg 200 mL/hr over 60 Minutes Intravenous Every 24 hours 06/26/22 1217     06/26/22 2000  meropenem (MERREM) 1 g in sodium chloride 0.9 % 100 mL IVPB  Status:  Discontinued        1 g 200 mL/hr over 30 Minutes Intravenous Every 12 hours 06/26/22 0717 06/26/22 1206   06/26/22 1800  cefTAZidime (FORTAZ) 2 g in sodium chloride 0.9 % 100 mL IVPB        2 g 200 mL/hr over 30 Minutes Intravenous Every 12 hours 06/26/22 1208     06/26/22 1600  ampicillin (OMNIPEN) 2 g in sodium chloride 0.9 % 100 mL IVPB  Status:  Discontinued        2 g 300 mL/hr over 20 Minutes Intravenous Every 6 hours 06/26/22 1408 06/27/22 1108   06/26/22 1200  vancomycin (VANCOREADY) IVPB 1500 mg/300 mL        1,500 mg 150 mL/hr over 120 Minutes Intravenous  Once 06/26/22 1107 06/26/22 1159   06/25/22 1000  acyclovir (ZOVIRAX) tablet 400 mg        400 mg Oral Daily 06/18/2022 1358     06/25/22 0845  meropenem (MERREM) 1 g in sodium chloride 0.9 % 100 mL IVPB  Status:  Discontinued        1 g 200 mL/hr over 30 Minutes Intravenous Every 8 hours 06/25/22 0758 06/26/22 0717         DVT prophylaxis: Heparin  Code Status: DNR  Family Communication: No family at bedside   CONSULTS neurology, oncology   Objective    Physical Examination:  General-appears in no acute distress Heart-S1-S2, regular, no murmur auscultated Lungs-clear to auscultation bilaterally, no wheezing or crackles auscultated Abdomen-soft, nontender, no organomegaly Extremities-no edema in the lower extremities  Status is: Inpatient: Encephalopathy        [place]   [facility] If 7PM-7AM, please contact night-coverage at [email or web address], Office  [phone]   06/27/2022, 3:34 PM  LOS: 2 days

## 2022-06-27 NOTE — Progress Notes (Signed)
ANTICOAGULATION CONSULT NOTE - Follow Up Consult  Pharmacy Consult for Heparin Indication:  h/o afib and PE  Allergies  Allergen Reactions   Hydrocodone-Acetaminophen Shortness Of Breath   Oxycodone Hcl Shortness Of Breath   Telmisartan-Hctz Shortness Of Breath, Palpitations and Other (See Comments)    Dizziness also   Aspirin Rash and Other (See Comments)    Confusion, also- Cannot take high doses   Atorvastatin Other (See Comments)    Myalgia    Buspirone Hcl Other (See Comments)    Headaches    Codeine Hives, Nausea And Vomiting and Other (See Comments)    Confusion also   Morphine Sulfate Itching   Paroxetine Other (See Comments)    Paresthesias: R arm, R leg, r side of face   Rabeprazole Other (See Comments)    headache   Ramipril Other (See Comments)    Unknown reaction    Patient Measurements: Height: '5\' 10"'$  (177.8 cm) Weight: 74.5 kg (164 lb 3.9 oz) IBW/kg (Calculated) : 73 Heparin Dosing Weight: 74.5 kg  Vital Signs: Temp: 102.6 F (39.2 C) (08/17 1427) Temp Source: Rectal (08/17 1427) BP: 80/43 (08/17 1515) Pulse Rate: 77 (08/17 1515)  Labs: Recent Labs    06/25/22 0417 06/25/22 1755 06/26/22 0503 06/27/22 0444 06/27/22 0758 06/27/22 1422  HGB 9.2*  --  10.2* 9.0*  --   --   HCT 28.1*  --  32.2* 26.7*  --   --   PLT 53*  --  37* 37*  --   --   LABPROT 24.1*  --  25.1* 17.8*  --   --   INR 2.2*  --  2.3* 1.5*  --   --   HEPARINUNFRC  --    < > 0.29*  --  0.49 0.73*  CREATININE 1.29*  --  1.52* 1.43*  --   --    < > = values in this interval not displayed.    Estimated Creatinine Clearance: 45.4 mL/min (A) (by C-G formula based on SCr of 1.43 mg/dL (H)).   Assessment: AC/Heme: Heparin drip (PTA warf on hold) for  atrial fibrillation and pulmonary embolus. Chronic thrombocytopenia (80's).  - 8/16 Vitamin K 5 mg IV & 2 U PLT -last AC clinic note (7/7) says '15mg'$  Su + Th, '10mg'$  all other days. Unclear if pt taking reliably, niece manages his meds  (organizes them?) but doesn't know if he takes consistently -  Heparin level 0.49, repeat 0.73 slightly high. INR 1.5 today. Hgb 9 down, Plts only 37  Goal of Therapy:  Heparin level 0.3-0.7 units/ml Monitor platelets by anticoagulation protocol: Yes   Plan:  - Decrease IV heparin to 1250 units/hr Daily HL and CBC   [name], PharmD, BCPS Clinical Staff Pharmacist [email or web address]  Alford Bell, [place] 06/27/2022,3:29 PM

## 2022-06-27 NOTE — Progress Notes (Signed)
Patient converted into SVT on the monitor at 1018. Dr Darrick Meigs at bedside. EKG was obtained, SVT confirmed. 2.5 mg metoprolol IVP given. Pt returned to a NSR with PACs. IV magnesium also started now that PICC line established for additional access. BP remains stable at this time. RN will continue to carefully monitor pt for changes in hemodynamic status.

## 2022-06-27 NOTE — Progress Notes (Signed)
NAME:  [name], MRN:  [id number], DOB:  Jul 14, 1946, LOS: 2 ADMISSION DATE:  06/19/2022, CONSULTATION DATE: 06/26/2022 REFERRING MD: Dr. Darrick Meigs, CHIEF COMPLAINT: Altered mental status  History of Present Illness:  [name] is a 76 y.o. male with a PMH significant for but not limited to HTN, HLD, bioprosthetic aortic valve replacement on Coumadin, paroxysmal atrial fibrillation, right lower lobe resection, chronic lymphocytic leukemia, mitral regurgitation, and anxiety who presented to the emergency department 8/14 for complaints of altered mental status.  Of note patient was recently hospitalized at this facility 06/14/2022 through 06/20/2022 for altered mental status, AKI, and neutropenia.  Patient underwent bone marrow biopsy with oncology which showed minimal CLL.    Patient presented for outpatient oncology appointment 8/14 and was found minimally responsive in parking lot prompting EMS call.    On ED arrival patient was seen altered slight improvement seen with intranasal Narcan.  CT head negative.  Lab work significant for sodium 134, creatinine 1.35 alkaline phosphatase 144, high-sensitivity troponin 20 WBC 3.0, platelets 84, INR 2.3. Patient was admitted per [facility] service for management admission.  PCCM consulted 8/16 for assistance in obtaining LP to rule out underlying meningitis.   Pertinent  Medical History  HTN, HLD, bioprosthetic aortic valve replacement on Coumadin, paroxysmal atrial fibrillation, CKD stage IIIb, right lower lobe resection, chronic lymphocytic leukemia, mitral regurgitation, and anxiety  Significant Hospital Events: Including procedures, antibiotic start and stop dates in addition to other pertinent events   8/14 admitted with altered mental status 8/16 PCCM consulted for assistance in obtaining LP 8/17-fever overnight  Interim History / Subjective:  Fever of 104 overnight Awake and interactive  Objective   Blood pressure (!) 87/50, pulse 90, temperature  (!) 102.5 F (39.2 C), temperature source Rectal, resp. rate (!) 24, height $RemoveBe'5\' 10"'[id number]$  (1.778 m), weight 74.5 kg, SpO2 96 %.        Intake/Output Summary (Last 24 hours) at 06/27/2022 0757 Last data filed at 06/27/2022 0734 Gross per 24 hour  Intake 4128.91 ml  Output 850 ml  Net 3278.91 ml   Filed Weights   06/26/22 0500 06/26/22 1419 06/27/22 0500  Weight: 78.1 kg 74.6 kg 74.5 kg    Examination: General: Elderly gentleman, chronically ill-appearing HENT: Moist oral mucosa Lungs: Clear breath sounds bilaterally Cardiovascular: S1-S2 appreciated Abdomen: Bowel sounds appreciated Extremities: No clubbing, no edema Neuro: Alert, answers questions, moving all extremities GU: Fair output  Resolved Hospital Problem list     Assessment & Plan:  Acute encephalopathy EEG negative -Concern for infectious etiology -Lumbar puncture recommended and pending at present, lumbar puncture pending secondary to thrombocytopenia, elevated INR   Fever -Blood cultures 814 negative to date -MRSA PCR negative -Empirically on treatment for meningitis/encephalitis  Acute kidney injury -Avoid nephrotoxic's -Maintain renal perfusion -Optimize renal perfusion  Atrial fibrillation with RVR -Amiodarone load and infusion started  Severe aortic stenosis with bioprosthetic AVR Chronic systolic and diastolic congestive heart failure Paroxysmal atrial fibrillation -Echocardiogram 8/4 with ejection fraction of 50% with hypokinesis, grade 2 diastolic dysfunction -On anticoagulation with heparin at present -Coumadin on hold  History of CLL Pancytopenia -Transfuse per protocol -Will need transfusion with platelets for lumbar puncture, was transfused 8/16 -Platelet currently 84 -INR is 2.1  History of pulmonary embolism -On anticoagulation  ICU transfer pending  Best Practice (right click and "Reselect all SmartList Selections" daily)   Diet/type: NPO DVT prophylaxis: systemic heparin GI  prophylaxis: N/A Lines: N/A Foley:  N/A Code Status:  DNR Last  date of multidisciplinary goals of care discussion [patient is DNR]  Labs   CBC: Recent Labs  Lab 06/19/2022 1013 06/25/22 0417 06/26/22 0503 06/27/22 0444  WBC 3.0* 1.0* 3.8* 2.3*  NEUTROABS 0.7*  --  2.6 1.8  HGB 11.7* 9.2* 10.2* 9.0*  HCT 34.6* 28.1* 32.2* 26.7*  MCV 93.5 96.6 100.6* 94.7  PLT 84* 53* 37* 37*    Basic Metabolic Panel: Recent Labs  Lab 06/17/2022 1013 06/25/22 0417 06/26/22 0503 06/27/22 0444  NA 134* 137 138 137  K 4.6 4.6 4.4 3.7  CL 103 111 111 114*  CO2 23 20* 20* 16*  GLUCOSE 94 95 90 99  BUN $Re'17 18 20 21  'ogn$ CREATININE 1.35* 1.29* 1.52* 1.43*  CALCIUM 9.4 8.0* 8.1* 7.9*  MG 2.0 1.9 2.0 1.7   GFR: Estimated Creatinine Clearance: 45.4 mL/min (A) (by C-G formula based on SCr of 1.43 mg/dL (H)). Recent Labs  Lab 06/12/2022 1013 06/25/22 0417 06/26/22 0503 06/26/22 1106 06/26/22 1358 06/27/22 0444  WBC 3.0* 1.0* 3.8*  --   --  2.3*  LATICACIDVEN  --   --   --  1.0 1.0  --     Liver Function Tests: Recent Labs  Lab 06/25/2022 1013 06/25/22 0417 06/26/22 0503 06/27/22 0444  AST 26 25 33 41  ALT 37 31 32 27  ALKPHOS 144* 110 112 116  BILITOT 0.7 0.5 0.5 0.9  PROT 6.2* 5.5* 5.7* 5.1*  ALBUMIN 4.0 2.9* 3.0* 2.7*   No results for input(s): "LIPASE", "AMYLASE" in the last 168 hours. Recent Labs  Lab 07/05/2022 1013  AMMONIA 21    ABG    Component Value Date/Time   HCO3 24.7 (H) 04/23/2010 1308   TCO2 28 07/16/2010 1126   ACIDBASEDEF 2.0 04/23/2010 1308   O2SAT 62.0 04/23/2010 1308     Coagulation Profile: Recent Labs  Lab 06/16/2022 1013 06/25/22 0417 06/26/22 0503 06/27/22 0444  INR 2.1* 2.2* 2.3* 1.5*    Cardiac Enzymes: No results for input(s): "CKTOTAL", "CKMB", "CKMBINDEX", "TROPONINI" in the last 168 hours.  HbA1C: Hgb A1c MFr Bld  Date/Time Value Ref Range Status  11/29/2015 09:01 AM 5.7 4.6 - 6.5 % Final    Comment:    Glycemic Control Guidelines for  People with Diabetes:Non Diabetic:  <6%Goal of Therapy: <7%Additional Action Suggested:  >8%     CBG: Recent Labs  Lab 07/09/2022 0956  GLUCAP 92    Review of Systems:   Awake, interactive Fever  Past Medical History:  He,  has a past medical history of Anticoagulants causing adverse effect in therapeutic use, Anxiety, Aortic regurgitation (04/2007 surgery), Cervical spondylosis, Chronic lymphocytic leukemia (CLL), B-cell ([place]) (approx 2012), History of prostate cancer (2003), History of pulmonary embolism (2011; 10/2014), Hyperlipidemia, Hypertension, Migraine syndrome, Mitral regurgitation, PAF (paroxysmal atrial fibrillation) ([place]), Panic attacks, and Pulmonary nodule, right (2015).   Surgical History:   Past Surgical History:  Procedure Laterality Date   AORTIC VALVE REPLACEMENT  2011   [place]  (bioprosthetic)   CARDIAC CATHETERIZATION  04/2010   Normal coronaries.   Carotid dopplers  08/2015   NORMAL   CATARACT EXTRACTION     L 12/06/09   R 09/14/09   COLONOSCOPY  01/26/13   tic's, o/w normal.  ([place])--recall 10 yrs.   PENILE PROSTHESIS IMPLANT     PROSTATECTOMY  04/2002   for prostate cancer   Pulmonary nodule resection  Fall/winter 2016   [facility]   SHOULDER SURGERY     rotator cuff on  both arms    TEE WITHOUT CARDIOVERSION N/A 07/13/2021   Procedure: TRANSESOPHAGEAL ECHOCARDIOGRAM (TEE);  Surgeon: Chrystie Nose, MD;  Location: [facility] ENDOSCOPY;  Service: Cardiovascular;  Laterality: N/A;   TIBIALIS TENDON TRANSFER / REPAIR Right 03/02/2019   Procedure: RECONSTRUCTION OF RIGHT  ANTERIOR TIBIALIS TENDON;  Surgeon: Terance Hart, MD;  Location: [facility];  Service: Orthopedics;  Laterality: Right;   TRANSTHORACIC ECHOCARDIOGRAM  09/20/2014; 03/2016; 03/2017   2015-mild LVH, EF 50-55%, wall motion nl, grade I diast dysfxn, prosth aort valve good,transaortic gradients decreased compared to echo 11/2013.   03/2016: EF 55-60%, normal LV function, severe LVH, normal  diast fxn, mild increase in AV gradient compared to 09/2014 echo.  2018: EF 55-60%, normal LV fxn, grd II DD, AV stable/gradient's stable.     Social History:   reports that he quit smoking about 41 years ago. His smoking use included cigarettes. He started smoking about 53 years ago. He has a 12.00 pack-year smoking history. He has never used smokeless tobacco. He reports that he does not currently use alcohol. He reports that he does not use drugs.   Family History:  His family history includes Cancer in his sister; Colon cancer (age of onset: 77) in his sister; Prostate cancer in his father; Stroke in his brother, mother, and sister. There is no history of Heart attack.   Allergies Allergies  Allergen Reactions   Hydrocodone-Acetaminophen Shortness Of Breath   Oxycodone Hcl Shortness Of Breath   Telmisartan-Hctz Shortness Of Breath, Palpitations and Other (See Comments)    Dizziness also   Aspirin Rash and Other (See Comments)    Confusion, also- Cannot take high doses   Atorvastatin Other (See Comments)    Myalgia    Buspirone Hcl Other (See Comments)    Headaches    Codeine Hives, Nausea And Vomiting and Other (See Comments)    Confusion also   Morphine Sulfate Itching   Paroxetine Other (See Comments)    Paresthesias: R arm, R leg, r side of face   Rabeprazole Other (See Comments)    headache   Ramipril Other (See Comments)    Unknown reaction     Home Medications  Prior to Admission medications   Medication Sig Start Date End Date Taking? Authorizing Provider  acyclovir (ZOVIRAX) 400 MG tablet TAKE 1 TABLET BY MOUTH EVERY DAY Patient taking differently: Take 400 mg by mouth daily. 04/30/22   Josph Macho, MD  Cholecalciferol (VITAMIN D3) 50 MCG (2000 UT) capsule Take 2,000 Units by mouth every morning.    [provider]  [employer] MOTION SICKNESS RELIEF 25 MG CHEW CHEW 1 TABLET EVERY DAY AS NEEDED FOR DIZZINESS 01/18/22   Shelva Majestic, MD  docusate sodium  (COLACE) 100 MG capsule Take 100 mg by mouth daily.    [provider]  ipratropium (ATROVENT) 0.03 % nasal spray Place 2 sprays into both nostrils every 12 (twelve) hours. Patient taking differently: Place 2 sprays into both nostrils 2 (two) times daily as needed for rhinitis (congestion). 10/04/20   Jarold Motto, PA  megestrol (MEGACE) 400 MG/10ML suspension Take 10 mLs (400 mg total) by mouth 2 (two) times daily. 06/20/22   Cipriano Bunker, MD  midodrine (PROAMATINE) 2.5 MG tablet Take 1 tablet (2.5 mg total) by mouth 3 (three) times daily with meals. 06/20/22   Cipriano Bunker, MD  omeprazole (PRILOSEC) 40 MG capsule Take 40 mg by mouth every evening. 10/18/20   [provider]  ondansetron (ZOFRAN) 8 MG tablet Take 1 tablet (8 mg total) by mouth 2 (two) times daily as needed for refractory nausea / vomiting. Start on day 2 after bendamustine chemo. 01/03/22   Volanda Napoleon, MD  prochlorperazine (COMPAZINE) 10 MG tablet Take 1 tablet (10 mg total) by mouth every 6 (six) hours as needed (Nausea or vomiting). 01/03/22   Volanda Napoleon, MD  rosuvastatin (CRESTOR) 10 MG tablet Take 10 mg by mouth daily.    [provider]  sertraline (ZOLOFT) 100 MG tablet Take 100 mg by mouth every morning.    [provider]  sodium chloride (OCEAN) 0.65 % SOLN nasal spray Place 1 spray into both nostrils as needed for congestion.    [provider]  warfarin (COUMADIN) 5 MG tablet TAKE AS DIRECTED BY COUMADIN CLINIC 09/17/21   Josue Hector, MD    The patient is critically ill with multiple organ systems failure and requires high complexity decision making for assessment and support, frequent evaluation and titration of therapies, application of advanced monitoring technologies and extensive interpretation of multiple databases. Critical Care Time devoted to patient care services described in this note independent of APP/resident time (if applicable)  is 32 minutes.    Sherrilyn Rist MD [facility] Personal pager: See Amion If unanswered, please page CCM On-call: [phone]

## 2022-06-27 NOTE — Progress Notes (Signed)
[place] Progress Note Patient Name: [name] DOB: 1945/12/02 MRN: [id number]   Date of Service  06/27/2022  HPI/Events of Note  Hypotension - BP = 87/37. Fever to 103.1 F. Tylenol given. Given severe AS on his recent echo. He will not tolerate vasodilation from fever well. He is currently on Midodrine, Vancomycin, Fortaz, Ampicillin and Acyclovir.   eICU Interventions  Plan: Cooling Blanket PRN.  Phenylephrine IV infusion via PIV. Titrate to MAP >= 65.  Transfer to ICU status.      Intervention Category Major Interventions: Hypotension - evaluation and management  Lysle Dingwall 06/27/2022, 12:35 AM

## 2022-06-27 NOTE — Progress Notes (Signed)
Clearly, his mental status is not getting any better.  Things are definitely worsening.  He now is down in the ICU.  He is having temperatures.  He is only cooling blanket.  His cultures have been negative.  Cognitive function is worse.  His lab show a BUN 21 creatinine 1.43.  Calcium 7.9 with an albumin of 2.7.  The IgG is 742 mg/dL.  His white cell count is 2.3.  Hemoglobin 9.  Platelet count still 37,000.  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  We will give a dose of IVIG.  I can put him on some steroids.  He clearly needs to have a LP done.  I will know if this is can be done here or over [facility].  I know that there is worried about moving over to [facility].  I still am unsure as to what is going on.  The MRI of the brain was pretty much unremarkable.  I will know if this is some kind of encephalitis.  He is not eating.  At some point, he is going need to be fed.  I do not think there is any problems with diarrhea.  There has been no bleeding.  I am just very perplexed as to why the continued downhill course.  His vital signs are temperature 102.5.  His pulse is 108.  Blood pressure is 113/79.  His head neck exam shows no obvious ocular or oral lesions.  There is no adenopathy in the neck.  Lungs are clear.  Cardiac exam tachycardic but regular.  Has a 2/6 systolic ejection murmur.  Abdomen is soft.  Bowel sounds are decreased.  There is no obvious fluid wave.  There is no obvious hepatosplenomegaly.  Extremity shows no clubbing, cyanosis or edema.  Neurological exam shows lethargy.  He really does not know who I am.  We try to ask him questions and there is really no answers.  Again, I do not know if there is some evidence of encephalitis.  Again the MRI certainly was unremarkable.  He has not had Rituxan for several months.  He has not had actual chemotherapy for probably 2 months at least.  Had believe there is some autoimmune component with his neutrophils  and with his thrombocytopenia.  Again, with a high temperature like this, you have to think there is some kind of infection.  Again this is where I think a spinal tap is going to be very helpful.  I am very appreciative of the incredible guidance of the hospitalist and the neurologist.  Everybody down in the ICU we will do a wonderful job taking care of him.  Kerby Nora, MD  Psalms 23:3

## 2022-06-27 NOTE — Progress Notes (Signed)
[facility] Progress Note Patient Name: [name] DOB: 1946/01/04 MRN: [id number]   Date of Service  06/27/2022  HPI/Events of Note  Patient going in and out of AFIB with RVR - Ventricular rate = 120's. Patient is on Phenylephrine IV infusion for hemodynamic support and a Heparin IV infusion for AVR.  eICU Interventions  Plan: Amiodarone IV load and infusion. Send AM labs now. Mg++ level STAT.      Intervention Category Major Interventions: Arrhythmia - evaluation and management  [name] 06/27/2022, 3:03 AM

## 2022-06-27 NOTE — TOC Progression Note (Addendum)
Transition of Care [facility]) - Progression Note    Patient Details  Name: [name] MRN: [id number] Date of Birth: 1946/07/20  Transition of Care [facility]) CM/SW Contact  Woodford Strege, Juliann Pulse, RN Phone Number: 06/27/2022, 12:18 PM  Clinical Narrative: Damaris Schooner to [name](Niece) requesting to use VA benefit for ST SNF-left vm w/[facility] [phone] [id number] await ST SNF list.From [place]-does not want to return. -2:48p-Patient going to [facility] for Lumbar Puncture. VA community screen checklist for SNF approval in shadow chart for MD signature-MD aware.     Expected Discharge Plan: [place] Barriers to Discharge: Continued Medical Work up  Expected Discharge Plan and Services Expected Discharge Plan: [place]   Discharge Planning Services: CM Consult Post Acute Care Choice: [place] Living arrangements for the past 2 months: [place]                                       Social Determinants of Health (SDOH) Interventions    Readmission Risk Interventions    06/26/2022    9:26 AM 06/16/2022    5:17 PM  Readmission Risk Prevention Plan  Transportation Screening Complete Complete  PCP or Specialist Appt within 3-5 Days  Complete  HRI or [place]  Complete  Social Work Consult for [place] Planning/Counseling  Complete  Palliative Care Screening  Not Applicable  Medication Review Press photographer) Complete Referral to Pharmacy  PCP or Specialist appointment within 3-5 days of discharge Complete   HRI or [place] Complete   SW Recovery Care/Counseling Consult Complete   [place] Not Applicable

## 2022-06-27 NOTE — Progress Notes (Signed)
ANTICOAGULATION CONSULT NOTE  Pharmacy Consult for heparin Indication: atrial fibrillation and pulmonary embolus - warfarin on hold  Allergies  Allergen Reactions   Hydrocodone-Acetaminophen Shortness Of Breath   Oxycodone Hcl Shortness Of Breath   Telmisartan-Hctz Shortness Of Breath, Palpitations and Other (See Comments)    Dizziness also   Aspirin Rash and Other (See Comments)    Confusion, also- Cannot take high doses   Atorvastatin Other (See Comments)    Myalgia    Buspirone Hcl Other (See Comments)    Headaches    Codeine Hives, Nausea And Vomiting and Other (See Comments)    Confusion also   Morphine Sulfate Itching   Paroxetine Other (See Comments)    Paresthesias: R arm, R leg, r side of face   Rabeprazole Other (See Comments)    headache   Ramipril Other (See Comments)    Unknown reaction    Patient Measurements: Height: '5\' 10"'$  (177.8 cm) Weight: 74.5 kg (164 lb 3.9 oz) IBW/kg (Calculated) : 73 Heparin Dosing Weight: n/a. Use TBW  Vital Signs: Temp: 102.4 F (39.1 C) (08/17 0800) Temp Source: Rectal (08/17 0800) BP: 81/42 (08/17 0800) Pulse Rate: 89 (08/17 0800)  Labs: Recent Labs    06/29/2022 1013 06/25/22 0417 06/25/22 1755 06/26/22 0503 06/27/22 0444 06/27/22 0758  HGB 11.7* 9.2*  --  10.2* 9.0*  --   HCT 34.6* 28.1*  --  32.2* 26.7*  --   PLT 84* 53*  --  37* 37*  --   LABPROT 23.0* 24.1*  --  25.1* 17.8*  --   INR 2.1* 2.2*  --  2.3* 1.5*  --   HEPARINUNFRC  --   --  0.25* 0.29*  --  0.49  CREATININE 1.35* 1.29*  --  1.52* 1.43*  --   TROPONINIHS 20*  --   --   --   --   --      Estimated Creatinine Clearance: 45.4 mL/min (A) (by C-G formula based on SCr of 1.43 mg/dL (H)).  Medications: Pt on warfarin PTA for atrial fibrillation and history of recurrent PE -Per notes from previous hospitalization and med reconciliation completed with niece, it is unclear if patient taking his warfarin consistently  Information from most recent [facility]  clinic note on 05/17/22: -Dose: Warfarin 15 mg Sun, Thurs; 10 mg all other days -INR goal: 2.5 - 3  Assessment: Pt is a 54 yoM on warfarin chronically for history of PE and atrial fibrillation. PMH also significant for CLL, not currently receiving chemotherapy (last dose on 04/11/22) and leukopenia/thrombocytopenia.   Pharmacy consulted to transition anticoagulation from warfarin to heparin drip in anticipation for invasive procedures.   Today, 06/27/22 INR = 1.5, remains subtherapeutic (warfarin on hold) CBC: Hgb = 9 and Plt = 37K (low) and decreased but consistent with labs during previous admission; pt receiving weekly NPlate (last dose 01/30/01)\ Heparin level therapeutic at 0.49 with heparin infusing at 1350 units/hr Confirmed with RN that heparin infusing at correct rate. No signs of bleeding.    Goal of Therapy:  Heparin level 0.3-0.7 units/ml INR 2.5 - 3 Monitor platelets by anticoagulation protocol: Yes   Plan:  Holding warfarin Continue heparin drip at 1350 units/hr Check confirmatory 8 hour HL HL, CBC daily. Leave daily INR ordered to monitor for when safe to proceed with invasive procedures  If heparin to be held for procedure, please communicate timing with pharmacy.    Thank you for allowing pharmacy to be a part of this patient's  care.  Royetta Asal, PharmD, [place] Please utilize Amion for appropriate phone number to reach the unit pharmacist ([place]) 06/27/2022 8:37 AM

## 2022-06-27 NOTE — Progress Notes (Signed)
Neurology Progress Note Brief HPI: [name] is a 76 y.o. male with a medical history significant for prostate cancer s/p prostatectomy, CLL on chemotherapy, recurrent PE on Coumadin, paroxysmal A-fib, and CKD stage IIIb who presented to [facility] 8/3 for evaluation of approximately 3 days of altered mental status and weakness complaints for a few weeks.  Approximately 3 days prior to hospital arrival patient was noted to be less interactive with family, more irritable, and with less oral intake than usual.  On arrival patient with mild elevated temperature at 99.6 F and pancytopenia.  Patient was started on broad-spectrum antibiotics without current identification of infectious source.  Patient underwent MRI imaging on 8/5 and while standing up from the MRI exam table, patient had an event described as unresponsiveness with questionable seizure-like activity and urinary incontinence. He was then discharged to [facility] on 06/20/2022. On 8/14 he was at an oncology appointment and was found unresponsive in the parking lot at [facility]. Since admission he has been febrile with no clear source. Blood cultures remain negative.  Subjective: Awaiting transfer to [facility]. Temperature is 103.61F on cooling blanket. PLT 37 and INR 1.5. Received 2U FFP yesterday, plan for IVIG today per oncology.  Remains on heparin gtt for afib and PE history. Amiodarone off due to hypotension. He awakens to verbal stimuli but is lethargic  Exam: Vitals:   06/27/22 0400 06/27/22 0701  BP: 113/79 (!) 87/50  Pulse: (!) 108 90  Resp: (!) 24 (!) 24  Temp: (!) 102.5 F (39.2 C)   SpO2: 99% 96%   Gen: In bed, NAD Resp: non-labored breathing, no acute distress Abd: soft, nt  Neuro: MS: Lethargic, awakens to voice. Oriented to self and place, does not say more than 2-3 words when woken up.  He will follow commands, but poor attention and requires frequent reorientation CN: PERRL, EOMI, no obvious facial asymmetry, cough weak,  head is midline, shoulder shrug equal, tongue is midline. Motor: moves all extremities antigravity but generalized weakness. 5 out of 5 in BL upper extremities. 3/5 in BL lower extremities but also limited lower leg testing due to poor participation. Bulk is low, tone is increased in all extremities, rigors and shivering noted Sensory:withdraws to pain in all extremities DTR: BUE 2+, BLE 3+ Kernig sign- negative, no nuchal rigidity  Brudzinski sign- negative  Pertinent Labs: PLT 37 INR 1.5   Impression:  [name] is a 76 y.o. male hx of prostate cancer s/p prostatectomy, CLL s/p chemo, recurrent PE, paroxysmal A-fib on anticoagulation, and CKD stage IIIb who was admitted recently for encephalopathy and discharged on 8/10 and returns after unresponsive episode as he stood up after his outpatient MRI and ?seizure like activity. Has since had worsening encephalopathy, ans has also been pancytopenic(on Granix now) and febrile with Tmax of 104 today. No clinical seizure activity noted during this admission, however encephalopathy seems to have progressed rapidly during this admission.  Differential certainly is broad including potential meningitis/encephalitis specially given fever, neutropenic. Paraneoplastic in the setting of his lymphoma or could be CNS involvement of lymphoma. Exam with poor attention, encephalopathic, increased tone in all extremities and hyperreflexic. Workup so far with MRI Brain negative. rEEG with no seizures. LP unsafe given platelet count below 100k and elevated INR.  Recommendations: - Antibiotics per ID. Agree with broad spectrum coverage at this time. - I ordered MRI total spine with and without contrast to evaluate for leptomeningeal spread. - would recommend transfer to [facility] for  cEEG. - If he continues to deteriorate, would consider high dose solumedrol '1000mg'$  daily x 5 doses if okay with ID and primary team. - Risks of LP far outweigh the benefits in  the setting of thrombocytopenia. Platelet count needs to be around 100k and heparin will need to be held with INR less than 1.4, to safely attempt LP   NEUROHOSPITALIST ADDENDUM Performed a face to face diagnostic evaluation.   I have reviewed the contents of history and physical exam as documented by PA/ARNP/Resident and agree with above documentation.  I have discussed and formulated the above plan as documented. Edits to the note have been made as needed.  Donnetta Simpers, MD [facility] [phone]

## 2022-06-27 NOTE — Consult Note (Signed)
[name] for Infectious Disease    Date of Admission:  07/05/2022   Total days of inpatient antibiotics 2        Reason for Consult: Fever   Principal Problem:   Acute metabolic encephalopathy Active Problems:   HLD (hyperlipidemia)   Paroxysmal atrial fibrillation (HCC)   Chronic lymphocytic leukemia (HCC)   Recurrent pulmonary embolism (HCC)   S/P Bioprosthetic AVR (aortic valve replacement) in 2011   History of pulmonary embolism   Pancytopenia (HCC)   AKI (acute kidney injury) (HCC)   Severe aortic stenosis   Chronic diastolic CHF (congestive heart failure) (HCC)   Orthostatic hypotension   Assessment: 76 year old male with CLL, recent admission for acute encephalopathy discharged 8/10 with bone marrow biopsy showing minor B-cell population.  Found down in car and admitted with: #Acute encephalopathy #Persistent fevers #Pancytopenia #Elevated INR on heparin -MRI brain showed no acute intracranial process - Chest x-ray was negative - CT head negative, - Blood cultures remain negative -Dr. Marin Bell oncology following.  Given persistent temperatures recommended spinal tap for concern for infection.  May have autoimmune component given neutrophils and thrombocytopenia.  Patient has not had Rituxan for several months.  Per primary LP requested with IR but they are unable to do it due to coagulopathy and low platelets.  PCCM was consulted and recommended transfer to [name] stepdown for LP by neurology.  Patient has been given platelets and vitamin K -Seen by neurology on 8/16 EEG was within normal limits.  Etiology: Cause of fever could be 2/2 lymphoma vs autoimmune vs less likely meningitis. Given CLL and bone bx+ b-cell population his symptoms could be 2/2 lymphoma. He is not classically presenting as meningitis pt (no HA, neck stiffness, encephalopathy has been ongoing since last admission), in addition fevers curve has not improved on antibiotics x almost 2  days. Hurdle in obtaining LP seem to be INR elevation and low plt. For now will continue broad spectrum antibiotics.  Recommendations:  - Continue Vanc+ceftazidime and ampicillin  If pt continue to fever tomorrow will plan on stopping amp. His Hx of lymphoma does increase the risk of listeria(esp nosocomial)  but if fevers do not trend down, unlikely listeria is an offending agent.  -Start doxycyline  I ordered RMSF, Lyme, anaplasma serologies. On 7/5 pt had seen PCP for crotoal lesion 1 weeks after pulling a tick off of him. NO doxy Rx. On 8/3 Lyme, RMSF ehrlechia were not indicative of tick born infection. He could have been re-exposed to tick as such will add doxy for coverage while awaiting serologies.  -If LP feasible please obtain  cell count 14 glucose, cytology, HSV, VZV, bacterial cultures, freeze sample -Follow blood Cx Cx  I spent more than 95 minutes for this patient encounter including reviewing data/chart, and coordinating care and >50% direct face to face time providing counseling/discussing diagnostics/treatment plan with patient  Microbiology:   Antibiotics: Acyclovir 8/15-present Ampicillin HR 60 depression Ceftazidime 8/16-present Vancomycin 8/16-p Meropenem 8/15  Cultures: Blood 8/14 no growth Urine  Other   HPI: [name] is a 76 y.o. male CLL, bioprosthetic aortic valve replacement on Coumadin, CKD stage IIIb, recurrent PE on Coumadin, PAF, recent hospitalization 8/4-8/10 for altered mental status(patient had undergone bone marrow biopsy minor monoclonal B-cell population, pericellular band marrow for age with trilineage hematopoesis, treated with Granix for neutropenia by oncology, question of seizure activity while getting MRI brain with EEG negative for seizure, attributed to orthostatic  hypotension and Rx'd midodrine discharged to SNF) AKI, neutropenia followed by [name].  He was found unresponsive in a parking lot at [name] taken to ED.   Mental status improved after intranasal administration of Narcan.  CT head was negative. On arrival she had a ANC of 700, WBC 101, blood cultures remain negative.  He continues to fever, started Acyclovir ceftazidime vancomycin and ampicillin for empiric meningitis coverage.  ID engaged as patient continued to fever.  Review of Systems: Review of Systems  All other systems reviewed and are negative.   Past Medical History:  Diagnosis Date   Anticoagulants causing adverse effect in therapeutic use    Anxiety    Paxil seemed to cause odd neuro side effects; better on prozac as of 09/2015   Aortic regurgitation 04/2007 surgery   Resolved with tissue AVR at [facility]   Cervical spondylosis    ESI by [name] in Ortho. 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   Hyperlipidemia    statin-intolerant, except for crestor low dose.   Hypertension    Migraine syndrome    Mitral regurgitation    PAF (paroxysmal atrial fibrillation) (HCC)    Panic attacks    "           "             "                  "                  "                       "                             "                          Pulmonary nodule, right 2015   RLL: resected at [facility]; Benign RLL nodule (fungal and AFB stains neg).  Plan is for [name] thoracic surg to do f/u o/v & CT chest 1 yr     Social History   Tobacco Use   Smoking status: Former    Packs/day: 1.00    Years: 12.00    Total pack years: 12.00    Types: Cigarettes    Start date: 11/28/1968    Quit date: 01/29/1981    Years since quitting: 41.4   Smokeless tobacco: Never   Tobacco comments:    quit 76 yo   Vaping Use   Vaping  Use: Never used  Substance Use Topics   Alcohol use: Not Currently   Drug use: No    Family History  Problem Relation Age of Onset   Cancer Sister        uterine   Colon cancer Sister 24   Stroke Mother    Prostate cancer Father    Stroke Sister    Stroke Brother    Heart attack Neg Hx    Scheduled Meds:  acyclovir  400 mg Oral Daily   amiodarone  150 mg Intravenous Once   Chlorhexidine Gluconate Cloth  6 each Topical Daily   cholecalciferol  2,000 Units Oral q morning   docusate sodium  100 mg Oral Daily   midodrine  2.5 mg Oral TID WC   mirtazapine  15 mg Oral QHS   multivitamin with minerals  1 tablet Oral Daily   rosuvastatin  5 mg Oral Daily   sertraline  100 mg Oral q morning   [START ON 07/03/2022] thiamine (VITAMIN B1) injection  100 mg Intravenous Daily   Continuous Infusions:  sodium chloride 125 mL/hr at 06/27/22 0842   sodium chloride 10 mL/hr at 06/27/22 4098   amiodarone     Followed by   amiodarone     ampicillin (OMNIPEN) IV Stopped (06/27/22 0715)   cefTAZidime (FORTAZ)  IV Stopped (06/26/22 1854)   heparin 1,350 Units/hr (06/27/22 0842)   magnesium sulfate bolus IVPB     phenylephrine (NEO-SYNEPHRINE) Adult infusion Stopped (06/27/22 0405)   thiamine (VITAMIN B1) injection Stopped (06/27/22 0827)   Followed by   thiamine (VITAMIN B1) injection     vancomycin     PRN Meds:.acetaminophen, albuterol, metoprolol tartrate, ondansetron **OR** ondansetron (ZOFRAN) IV, mouth rinse, senna-docusate, traMADol Allergies  Allergen Reactions   Hydrocodone-Acetaminophen Shortness Of Breath   Oxycodone Hcl Shortness Of Breath   Telmisartan-Hctz Shortness Of Breath, Palpitations and Other (See Comments)    Dizziness also   Aspirin Rash and Other (See Comments)    Confusion, also- Cannot take high doses   Atorvastatin Other (See Comments)    Myalgia    Buspirone Hcl Other (See Comments)    Headaches    Codeine Hives, Nausea And Vomiting and Other (See  Comments)    Confusion also   Morphine Sulfate Itching   Paroxetine Other (See Comments)    Paresthesias: R arm, R leg, r side of face   Rabeprazole Other (See Comments)    headache   Ramipril Other (See Comments)    Unknown reaction    OBJECTIVE: Blood pressure (!) 100/57, pulse 91, temperature (!) 102.4 F (39.1 C), temperature source Rectal, resp. rate (!) 23, height $RemoveBe'5\' 10"'OfDKIUZMs$  (1.778 m), weight 74.5 kg, SpO2 100 %.  Physical Exam Constitutional:      General: He is not in acute distress.    Appearance: He is normal weight. He is not toxic-appearing.  HENT:     Head: Normocephalic and atraumatic.     Right Ear: External ear normal.     Left Ear: External ear normal.     Nose: No congestion or rhinorrhea.     Mouth/Throat:     Mouth: Mucous membranes are moist.     Pharynx: Oropharynx is clear.  Eyes:     Extraocular Movements: Extraocular movements intact.     Conjunctiva/sclera: Conjunctivae normal.     Pupils: Pupils are equal, round, and reactive to light.  Cardiovascular:     Rate and Rhythm: Normal rate and regular rhythm.     Heart sounds: No murmur heard.    No friction rub. No gallop.  Pulmonary:     Effort: Pulmonary effort is normal.     Breath sounds: Normal breath sounds.  Abdominal:     General: Abdomen is flat. Bowel sounds are normal.     Palpations: Abdomen is soft.  Musculoskeletal:        General: No swelling. Normal range of motion.     Cervical back: Normal range of  motion and neck supple.  Skin:    General: Skin is warm and dry.  Neurological:     General: No focal deficit present.     Mental Status: He is oriented to person, place, and time.  Psychiatric:        Mood and Affect: Mood normal.     Lab Results Lab Results  Component Value Date   WBC 2.3 (L) 06/27/2022   HGB 9.0 (L) 06/27/2022   HCT 26.7 (L) 06/27/2022   MCV 94.7 06/27/2022   PLT 37 (L) 06/27/2022    Lab Results  Component Value Date   CREATININE 1.43 (H) 06/27/2022    BUN 21 06/27/2022   NA 137 06/27/2022   K 3.7 06/27/2022   CL 114 (H) 06/27/2022   CO2 16 (L) 06/27/2022    Lab Results  Component Value Date   ALT 27 06/27/2022   AST 41 06/27/2022   ALKPHOS 116 06/27/2022   BILITOT 0.9 06/27/2022       Laurice Record, [place] [facility]ious [facility] 06/27/2022, 9:31 AM

## 2022-06-27 NOTE — Progress Notes (Signed)
Peripherally Inserted Central Catheter Placement  The IV Nurse has discussed with the patient and/or persons authorized to consent for the patient, the purpose of this procedure and the potential benefits and risks involved with this procedure.  The benefits include less needle sticks, lab draws from the catheter, and the patient may be discharged home with the catheter. Risks include, but not limited to, infection, bleeding, blood clot (thrombus formation), and puncture of an artery; nerve damage and irregular heartbeat and possibility to perform a PICC exchange if needed/ordered by physician.  Alternatives to this procedure were also discussed.  Bard Power PICC patient education guide, fact sheet on infection prevention and patient information card has been provided to patient /or left at bedside.    PICC Placement Documentation  PICC Triple Lumen 06/27/22 Right Brachial 39 cm 0 cm (Active)  Indication for Insertion or Continuance of Line Vasoactive infusions 06/27/22 0935  Exposed Catheter (cm) 0 cm 06/27/22 0935  Site Assessment Clean, Dry, Intact 06/27/22 0935  Lumen #1 Status Flushed;Blood return noted;Saline locked 06/27/22 0935  Lumen #2 Status Flushed;Blood return noted;Saline locked 06/27/22 0935  Lumen #3 Status Flushed;Blood return noted;Saline locked 06/27/22 0935  Dressing Type Transparent 06/27/22 0935  Dressing Status Antimicrobial disc in place 06/27/22 0935  Dressing Change Due 07/04/22 06/27/22 0935       Scotty Court 06/27/2022, 9:37 AM

## 2022-06-27 NOTE — Progress Notes (Signed)
[place] Progress Note Patient Name: [name] DOB: Jul 15, 1946 MRN: [id number]   Date of Service  06/27/2022  HPI/Events of Note  PCCM ground team unable to get back to [facility] to place venous access at this time.   eICU Interventions  Plan: Lopressor 2.5 mg IV Q 3 hours PRN HR > 115. Hold dose for SBP < 105.     Intervention Category Major Interventions: Arrhythmia - evaluation and management  Derek Huneycutt [place] 06/27/2022, 3:47 AM

## 2022-06-27 NOTE — Progress Notes (Signed)
When RN assessed pt at bedside this afternoon, pt was experiencing wheezing, which was treated with PRN albuterol at 1628.  Shortly after receiving treatment, pt converted into afib on the monitor (at approximately 1645). HR 120 bpm. Dr Darrick Meigs paged, and orders received to give 2.5 mg IV metoprolol. Pt converted into NSR with PACs. Dr Darrick Meigs then visited patient at bedside due to ongoing wheezing and pt complaint of anxiety. Xopenex and ativan ordered. This RN will continue to carefully monitor pt.

## 2022-06-27 NOTE — Progress Notes (Signed)
Got report from [place]; Pt still febrile. Called CCM, Dr. Oletta Darter had new order in, pt has one PIV, difficulty to get an access.

## 2022-06-27 NOTE — Progress Notes (Signed)
PT Cancellation Note  Patient Details Name: [name] MRN: [id number] DOB: 1946/10/22   Cancelled Treatment:    Reason Eval/Treat Not Completed: Other (comment), patient busy with nursing . Will check back another time.  [place] Office [phone] Weekend [phone]    Claretha Cooper 06/27/2022, 4:03 PM

## 2022-06-27 NOTE — Progress Notes (Signed)
[place] Progress Note Patient Name: [name] DOB: [birth year] MRN: [id number]   Date of Service  06/27/2022  HPI/Events of Note  Patient has transfer orders to Progressive bed at [facility]. However, needs transfer to an ICU bed.   eICU Interventions  Plan: D/C Transfer to a Progressive bed at [facility]. Transfer to an ICU bed at [facility].      Intervention Category Major Interventions: Other:  Lysle Dingwall 06/27/2022, 1:33 AM

## 2022-06-28 ENCOUNTER — Inpatient Hospital Stay (HOSPITAL_COMMUNITY): Payer: No Typology Code available for payment source

## 2022-06-28 ENCOUNTER — Ambulatory Visit: Payer: Medicare Other | Admitting: Hematology & Oncology

## 2022-06-28 ENCOUNTER — Ambulatory Visit: Payer: Medicare Other

## 2022-06-28 ENCOUNTER — Other Ambulatory Visit: Payer: Medicare Other

## 2022-06-28 DIAGNOSIS — G9341 Metabolic encephalopathy: Secondary | ICD-10-CM | POA: Diagnosis not present

## 2022-06-28 DIAGNOSIS — R4182 Altered mental status, unspecified: Secondary | ICD-10-CM | POA: Diagnosis not present

## 2022-06-28 DIAGNOSIS — D61818 Other pancytopenia: Secondary | ICD-10-CM | POA: Diagnosis not present

## 2022-06-28 LAB — DIC (DISSEMINATED INTRAVASCULAR COAGULATION)PANEL
D-Dimer, Quant: 0.74 ug/mL-FEU — ABNORMAL HIGH (ref 0.00–0.50)
Fibrinogen: 270 mg/dL (ref 210–475)
INR: 1.6 — ABNORMAL HIGH (ref 0.8–1.2)
Platelets: 20 10*3/uL — CL (ref 150–400)
Prothrombin Time: 19 seconds — ABNORMAL HIGH (ref 11.4–15.2)
aPTT: >200 seconds (ref 24–36)

## 2022-06-28 LAB — PROTIME-INR
INR: 1.4 — ABNORMAL HIGH (ref 0.8–1.2)
Prothrombin Time: 17.4 seconds — ABNORMAL HIGH (ref 11.4–15.2)

## 2022-06-28 LAB — COMPREHENSIVE METABOLIC PANEL
ALT: 27 U/L (ref 0–44)
AST: 86 U/L — ABNORMAL HIGH (ref 15–41)
Albumin: 2 g/dL — ABNORMAL LOW (ref 3.5–5.0)
Alkaline Phosphatase: 87 U/L (ref 38–126)
Anion gap: 4 — ABNORMAL LOW (ref 5–15)
BUN: 28 mg/dL — ABNORMAL HIGH (ref 8–23)
CO2: 17 mmol/L — ABNORMAL LOW (ref 22–32)
Calcium: 7.3 mg/dL — ABNORMAL LOW (ref 8.9–10.3)
Chloride: 120 mmol/L — ABNORMAL HIGH (ref 98–111)
Creatinine, Ser: 1.99 mg/dL — ABNORMAL HIGH (ref 0.61–1.24)
GFR, Estimated: 34 mL/min — ABNORMAL LOW (ref >60)
Glucose, Bld: 103 mg/dL — ABNORMAL HIGH (ref 70–99)
Potassium: 4.1 mmol/L (ref 3.5–5.1)
Sodium: 141 mmol/L (ref 135–145)
Total Bilirubin: 0.5 mg/dL (ref 0.3–1.2)
Total Protein: 4 g/dL — ABNORMAL LOW (ref 6.5–8.1)

## 2022-06-28 LAB — CBC WITH DIFFERENTIAL/PLATELET
Abs Immature Granulocytes: 0.4 10*3/uL — ABNORMAL HIGH (ref 0.00–0.07)
Basophils Absolute: 0 10*3/uL (ref 0.0–0.1)
Basophils Relative: 1 %
Eosinophils Absolute: 0 10*3/uL (ref 0.0–0.5)
Eosinophils Relative: 0 %
HCT: 15.6 % — ABNORMAL LOW (ref 39.0–52.0)
Hemoglobin: 5.1 g/dL — CL (ref 13.0–17.0)
Immature Granulocytes: 9 %
Lymphocytes Relative: 19 %
Lymphs Abs: 0.8 10*3/uL (ref 0.7–4.0)
MCH: 31.7 pg (ref 26.0–34.0)
MCHC: 32.7 g/dL (ref 30.0–36.0)
MCV: 96.9 fL (ref 80.0–100.0)
Monocytes Absolute: 0.1 10*3/uL (ref 0.1–1.0)
Monocytes Relative: 2 %
Neutro Abs: 3 10*3/uL (ref 1.7–7.7)
Neutrophils Relative %: 69 %
Platelets: 31 10*3/uL — ABNORMAL LOW (ref 150–400)
RBC: 1.61 MIL/uL — ABNORMAL LOW (ref 4.22–5.81)
RDW: 15.1 % (ref 11.5–15.5)
WBC: 4.4 10*3/uL (ref 4.0–10.5)
nRBC: 0 % (ref 0.0–0.2)

## 2022-06-28 LAB — LYME DISEASE SEROLOGY W/REFLEX: Lyme Total Antibody EIA: NEGATIVE

## 2022-06-28 LAB — PREPARE RBC (CROSSMATCH)

## 2022-06-28 LAB — HEPARIN LEVEL (UNFRACTIONATED): Heparin Unfractionated: 0.69 IU/mL (ref 0.30–0.70)

## 2022-06-28 MED ORDER — SODIUM CHLORIDE 0.9% IV SOLUTION
Freq: Once | INTRAVENOUS | Status: AC
Start: 2022-06-28 — End: 2022-06-28

## 2022-06-28 MED ORDER — HALOPERIDOL LACTATE 5 MG/ML IJ SOLN: 2.5000 mg | INTRAMUSCULAR | Status: DC | PRN

## 2022-06-28 MED ORDER — GLYCOPYRROLATE 1 MG PO TABS: 1.0000 mg | ORAL_TABLET | ORAL | Status: DC | PRN

## 2022-06-28 MED ORDER — FENTANYL CITRATE PF 50 MCG/ML IJ SOSY
25.0000 ug | PREFILLED_SYRINGE | INTRAMUSCULAR | Status: DC | PRN
  Administered 2022-06-28 (×3): 50 ug via INTRAVENOUS
  Administered 2022-06-29 – 2022-06-30 (×13): 100 ug via INTRAVENOUS
  Filled 2022-06-28 (×5): qty 2
  Filled 2022-06-28 (×2): qty 1
  Filled 2022-06-28 (×6): qty 2
  Filled 2022-06-28: qty 1
  Filled 2022-06-28 (×2): qty 2

## 2022-06-28 MED ORDER — POLYVINYL ALCOHOL 1.4 % OP SOLN: 1.0000 [drp] | Freq: Four times a day (QID) | OPHTHALMIC | Status: DC | PRN

## 2022-06-28 MED ORDER — SODIUM CHLORIDE (PF) 0.9 % IJ SOLN
INTRAMUSCULAR | Status: AC
  Filled 2022-06-28: qty 50

## 2022-06-28 MED ORDER — DIPHENHYDRAMINE HCL 50 MG/ML IJ SOLN: 25.0000 mg | INTRAMUSCULAR | Status: DC | PRN

## 2022-06-28 MED ORDER — FENTANYL CITRATE PF 50 MCG/ML IJ SOSY
25.0000 ug | PREFILLED_SYRINGE | INTRAMUSCULAR | Status: DC | PRN
  Administered 2022-06-28: 25 ug via INTRAVENOUS
  Filled 2022-06-28: qty 1

## 2022-06-28 MED ORDER — IOHEXOL 350 MG/ML SOLN
100.0000 mL | Freq: Once | INTRAVENOUS | Status: AC | PRN
  Administered 2022-06-28: 100 mL via INTRAVENOUS

## 2022-06-28 MED ORDER — MIDAZOLAM HCL 2 MG/2ML IJ SOLN
2.0000 mg | INTRAMUSCULAR | Status: DC | PRN
  Administered 2022-06-28: 4 mg via INTRAVENOUS
  Filled 2022-06-28: qty 4

## 2022-06-28 MED ORDER — GLYCOPYRROLATE 0.2 MG/ML IJ SOLN
0.2000 mg | INTRAMUSCULAR | Status: DC | PRN
  Administered 2022-07-02: 0.2 mg via INTRAVENOUS
  Filled 2022-06-28: qty 1

## 2022-06-28 MED ORDER — GLYCOPYRROLATE 0.2 MG/ML IJ SOLN: 0.2000 mg | INTRAMUSCULAR | Status: DC | PRN

## 2022-06-28 NOTE — Progress Notes (Signed)
NAME:  [name], MRN:  [id number], DOB:  1946/04/13, LOS: 3 ADMISSION DATE:  06/13/2022, CONSULTATION DATE: 06/26/2022 REFERRING MD: Dr. Darrick Meigs, CHIEF COMPLAINT: Altered mental status  History of Present Illness:  [name] is a 76 y.o. male with a PMH significant for but not limited to HTN, HLD, bioprosthetic aortic valve replacement on Coumadin, paroxysmal atrial fibrillation, right lower lobe resection, chronic lymphocytic leukemia, mitral regurgitation, and anxiety who presented to the emergency department 8/14 for complaints of altered mental status.  Of note patient was recently hospitalized at this facility 06/14/2022 through 06/20/2022 for altered mental status, AKI, and neutropenia.  Patient underwent bone marrow biopsy with oncology which showed minimal CLL.    Patient presented for outpatient oncology appointment 8/14 and was found minimally responsive in parking lot prompting EMS call.    On ED arrival patient was seen altered slight improvement seen with intranasal Narcan.  CT head negative.  Lab work significant for sodium 134, creatinine 1.35 alkaline phosphatase 144, high-sensitivity troponin 20 WBC 3.0, platelets 84, INR 2.3. Patient was admitted per [facility] service for management admission.  PCCM consulted 8/16 for assistance in obtaining LP to rule out underlying meningitis.   Pertinent  Medical History  HTN, HLD, bioprosthetic aortic valve replacement on Coumadin, paroxysmal atrial fibrillation, CKD stage IIIb, right lower lobe resection, chronic lymphocytic leukemia, mitral regurgitation, and anxiety  Significant Hospital Events: Including procedures, antibiotic start and stop dates in addition to other pertinent events   8/14 admitted with altered mental status 8/16 PCCM consulted for assistance in obtaining LP 8/17-fever overnight  Interim History / Subjective:  Fever of 103.6 overnight Awake   Objective   Blood pressure (!) 69/46, pulse 82, temperature 99.2 F  (37.3 C), temperature source Rectal, resp. rate (!) 22, height $RemoveBe'5\' 10"'DUXojgWDZ$  (1.778 m), weight 74.5 kg, SpO2 94 %. CVP:  [9 mmHg] 9 mmHg      Intake/Output Summary (Last 24 hours) at 06/28/2022 1050 Last data filed at 06/28/2022 1028 Gross per 24 hour  Intake 5718.59 ml  Output 700 ml  Net 5018.59 ml   Filed Weights   06/26/22 0500 06/26/22 1419 06/27/22 0500  Weight: 78.1 kg 74.6 kg 74.5 kg    Examination: General: Elderly gentleman, chronically ill-appearing  HENT: Moist oral mucosa Lungs: Clear breath sounds bilaterally Cardiovascular: S1-S2 appreciated Abdomen: Bowel sounds appreciated, tenderness in the flanks Extremities: No clubbing, no edema Neuro: Easily arousable, moving all extremities GU: Fair output  BMP BUN 28, creatinine 1.99  Resolved Hospital Problem list     Assessment & Plan:  Acute encephalopathy Negative EEG Meningoencephalitis -Concern for infectious etiology -Not a good lumbar puncture candidate secondary to thrombocytopenia, coagulopathy, on anticoagulation -We will continue antibiotics and antivirals  Fever -Blood cultures negative to date -MRSA PCR negative -Continue antibiotics at present  New severe anemia -Concern for blood loss anemia -Patient has been on anticoagulation -Concern for retroperitoneal bleed -Will obtain a CT angio abdomen and pelvis  Acute kidney injury -Maintain renal perfusion -Optimize renal perfusion -On pressors at present  Atrial fibrillation with RVR -Amiodarone infusion  Severe aortic stenosis with bioprosthetic AVR Chronic systolic and diastolic congestive heart failure Paroxysmal atrial fibrillation -Echocardiogram 8/4 with ejection fraction of 50% with hypokinesis, grade 2 diastolic dysfunction -On anticoagulation with heparin at present -Coumadin on hold  History of CLL Pancytopenia -Platelets trended back down  Anemia -Concern for blood loss -Follow CT angio  Anticoagulation on hold at present  secondary to acute drop in hematocrit  ICU transfer pending  Best Practice (right click and "Reselect all SmartList Selections" daily)   Diet/type: NPO DVT prophylaxis: systemic heparin GI prophylaxis: N/A Lines: N/A Foley:  N/A Code Status:  DNR Last date of multidisciplinary goals of care discussion [patient is DNR]  Labs   CBC: Recent Labs  Lab 06/17/2022 1013 06/25/22 0417 06/26/22 0503 06/27/22 0444 06/28/22 0612 06/28/22 0816 06/28/22 0948  WBC 3.0* 1.0* 3.8* 2.3* 4.4 3.2*  --   NEUTROABS 0.7*  --  2.6 1.8 3.0 1.8  --   HGB 11.7* 9.2* 10.2* 9.0* 5.1* 5.7*  --   HCT 34.6* 28.1* 32.2* 26.7* 15.6* 17.4*  --   MCV 93.5 96.6 100.6* 94.7 96.9 96.1  --   PLT 84* 53* 37* 37* 31* 23* 20*    Basic Metabolic Panel: Recent Labs  Lab 06/23/2022 1013 06/25/22 0417 06/26/22 0503 06/27/22 0444 06/28/22 0612  NA 134* 137 138 137 141  K 4.6 4.6 4.4 3.7 4.1  CL 103 111 111 114* 120*  CO2 23 20* 20* 16* 17*  GLUCOSE 94 95 90 99 103*  BUN $Re'17 18 20 21 'sVs$ 28*  CREATININE 1.35* 1.29* 1.52* 1.43* 1.99*  CALCIUM 9.4 8.0* 8.1* 7.9* 7.3*  MG 2.0 1.9 2.0 1.7  --    GFR: Estimated Creatinine Clearance: 32.6 mL/min (A) (by C-G formula based on SCr of 1.99 mg/dL (H)). Recent Labs  Lab 06/26/22 0503 06/26/22 1106 06/26/22 1358 06/27/22 0444 06/28/22 0612 06/28/22 0816  WBC 3.8*  --   --  2.3* 4.4 3.2*  LATICACIDVEN  --  1.0 1.0  --   --   --     Liver Function Tests: Recent Labs  Lab 06/30/2022 1013 06/25/22 0417 06/26/22 0503 06/27/22 0444 06/28/22 0612  AST 26 25 33 41 86*  ALT 37 31 32 27 27  ALKPHOS 144* 110 112 116 87  BILITOT 0.7 0.5 0.5 0.9 0.5  PROT 6.2* 5.5* 5.7* 5.1* 4.0*  ALBUMIN 4.0 2.9* 3.0* 2.7* 2.0*   No results for input(s): "LIPASE", "AMYLASE" in the last 168 hours. Recent Labs  Lab 06/13/2022 1013  AMMONIA 21    ABG    Component Value Date/Time   HCO3 24.7 (H) 04/23/2010 1308   TCO2 28 07/16/2010 1126   ACIDBASEDEF 2.0 04/23/2010 1308   O2SAT  62.0 04/23/2010 1308     Coagulation Profile: Recent Labs  Lab 06/25/22 0417 06/26/22 0503 06/27/22 0444 06/28/22 0612 06/28/22 0948  INR 2.2* 2.3* 1.5* 1.4* 1.6*    Cardiac Enzymes: No results for input(s): "CKTOTAL", "CKMB", "CKMBINDEX", "TROPONINI" in the last 168 hours.  HbA1C: Hgb A1c MFr Bld  Date/Time Value Ref Range Status  11/29/2015 09:01 AM 5.7 4.6 - 6.5 % Final    Comment:    Glycemic Control Guidelines for People with Diabetes:Non Diabetic:  <6%Goal of Therapy: <7%Additional Action Suggested:  >8%     CBG: Recent Labs  Lab 07/09/2022 0956  GLUCAP 92    Review of Systems:   Awake, interactive Fever  Past Medical History:  He,  has a past medical history of Anticoagulants causing adverse effect in therapeutic use, Anxiety, Aortic regurgitation (04/2007 surgery), Cervical spondylosis, Chronic lymphocytic leukemia (CLL), B-cell ([place]) (approx 2012), History of prostate cancer (2003), History of pulmonary embolism (2011; 10/2014), Hyperlipidemia, Hypertension, Migraine syndrome, Mitral regurgitation, PAF (paroxysmal atrial fibrillation) ([place]), Panic attacks, and Pulmonary nodule, right (2015).   Surgical History:   Past Surgical History:  Procedure Laterality Date   AORTIC VALVE REPLACEMENT  2011   [facility]  (bioprosthetic)   CARDIAC CATHETERIZATION  04/2010   Normal coronaries.   Carotid dopplers  08/2015   NORMAL   CATARACT EXTRACTION     L 12/06/09   R 09/14/09   COLONOSCOPY  01/26/13   tic's, o/w normal.  ([place])--recall 10 yrs.   PENILE PROSTHESIS IMPLANT     PROSTATECTOMY  04/2002   for prostate cancer   Pulmonary nodule resection  Fall/winter 2016   [facility]   SHOULDER SURGERY     rotator cuff on both arms    TEE WITHOUT CARDIOVERSION N/A 07/13/2021   Procedure: TRANSESOPHAGEAL ECHOCARDIOGRAM (TEE);  Surgeon: Pixie Casino, MD;  [facility] ENDOSCOPY;  Service: Cardiovascular;  Laterality: N/A;   TIBIALIS TENDON TRANSFER / REPAIR Right 03/02/2019    Procedure: RECONSTRUCTION OF RIGHT  ANTERIOR TIBIALIS TENDON;  Surgeon: Erle Crocker, MD;  Location: [facility];  Service: Orthopedics;  Laterality: Right;   TRANSTHORACIC ECHOCARDIOGRAM  09/20/2014; 03/2016; 03/2017   2015-mild LVH, EF 50-55%, wall motion nl, grade I diast dysfxn, prosth aort valve good,transaortic gradients decreased compared to echo 11/2013.   03/2016: EF 55-60%, normal LV function, severe LVH, normal diast fxn, mild increase in AV gradient compared to 09/2014 echo.  2018: EF 55-60%, normal LV fxn, grd II DD, AV stable/gradient's stable.     Social History:   reports that he quit smoking about 41 years ago. His smoking use included cigarettes. He started smoking about 53 years ago. He has a 12.00 pack-year smoking history. He has never used smokeless tobacco. He reports that he does not currently use alcohol. He reports that he does not use drugs.   Family History:  His family history includes Cancer in his sister; Colon cancer (age of onset: 65) in his sister; Prostate cancer in his father; Stroke in his brother, mother, and sister. There is no history of Heart attack.   Allergies Allergies  Allergen Reactions   Hydrocodone-Acetaminophen Shortness Of Breath   Oxycodone Hcl Shortness Of Breath   Telmisartan-Hctz Shortness Of Breath, Palpitations and Other (See Comments)    Dizziness also   Aspirin Rash and Other (See Comments)    Confusion, also- Cannot take high doses   Atorvastatin Other (See Comments)    Myalgia    Buspirone Hcl Other (See Comments)    Headaches    Codeine Hives, Nausea And Vomiting and Other (See Comments)    Confusion also   Morphine Sulfate Itching   Paroxetine Other (See Comments)    Paresthesias: R arm, R leg, r side of face   Rabeprazole Other (See Comments)    headache   Ramipril Other (See Comments)    Unknown reaction     Home Medications  Prior to Admission medications   Medication Sig Start Date End Date  Taking? Authorizing Provider  acyclovir (ZOVIRAX) 400 MG tablet TAKE 1 TABLET BY MOUTH EVERY DAY Patient taking differently: Take 400 mg by mouth daily. 04/30/22   Volanda Napoleon, MD  Cholecalciferol (VITAMIN D3) 50 MCG (2000 UT) capsule Take 2,000 Units by mouth every morning.    [provider]  [employer] MOTION SICKNESS RELIEF 25 MG CHEW CHEW 1 TABLET EVERY DAY AS NEEDED FOR DIZZINESS 01/18/22   [name], MD  docusate sodium (COLACE) 100 MG capsule Take 100 mg by mouth daily.    [provider]  ipratropium (ATROVENT) 0.03 % nasal spray Place 2 sprays into both nostrils every 12 (twelve) hours. Patient taking  differently: Place 2 sprays into both nostrils 2 (two) times daily as needed for rhinitis (congestion). 10/04/20   Inda Coke, PA  megestrol (MEGACE) 400 MG/10ML suspension Take 10 mLs (400 mg total) by mouth 2 (two) times daily. 06/20/22   Shawna Clamp, MD  midodrine (PROAMATINE) 2.5 MG tablet Take 1 tablet (2.5 mg total) by mouth 3 (three) times daily with meals. 06/20/22   Shawna Clamp, MD  omeprazole (PRILOSEC) 40 MG capsule Take 40 mg by mouth every evening. 10/18/20   [provider]  ondansetron (ZOFRAN) 8 MG tablet Take 1 tablet (8 mg total) by mouth 2 (two) times daily as needed for refractory nausea / vomiting. Start on day 2 after bendamustine chemo. 01/03/22   Volanda Napoleon, MD  prochlorperazine (COMPAZINE) 10 MG tablet Take 1 tablet (10 mg total) by mouth every 6 (six) hours as needed (Nausea or vomiting). 01/03/22   Volanda Napoleon, MD  rosuvastatin (CRESTOR) 10 MG tablet Take 10 mg by mouth daily.    [provider]  sertraline (ZOLOFT) 100 MG tablet Take 100 mg by mouth every morning.    [provider]  sodium chloride (OCEAN) 0.65 % SOLN nasal spray Place 1 spray into both nostrils as needed for congestion.    [provider]  warfarin (COUMADIN) 5 MG tablet TAKE AS DIRECTED BY COUMADIN CLINIC 09/17/21    Josue Hector, MD   The patient is critically ill with multiple organ systems failure and requires high complexity decision making for assessment and support, frequent evaluation and titration of therapies, application of advanced monitoring technologies and extensive interpretation of multiple databases. Critical Care Time devoted to patient care services described in this note independent of APP/resident time (if applicable)  is 32 minutes.   Sherrilyn Rist MD [place] [facility] Personal pager: See Amion If unanswered, please page CCM On-call: [phone]

## 2022-06-28 NOTE — Progress Notes (Signed)
Brief Neuro Update:  I did not see Mr. Bow Buntyn Mednick today but I did speak with Dr. Ander Slade and earlier with Dr. Darrick Meigs in person at [facility]. He was started on neo for significant hypotension overnight and is persistently febrile. Still waiting on transfer to [facility] for a cEEG and waiting for a bed. Was unable to get MRI total spine 2/2 shivering last night and pressor requirement but team is planning to get the MRIs today in the afternoon. His platelet count dropped again and stands below 20 today.  My overall suspicion for the current worsening being autoimmune in nature is low. Significant hypotension and Fever argues against it. He has had a gradual progressive decline and I think that still leaves potential Cns involvement of CLL or paraneoplastic limbic encephalitis in the differential but I think he is very sick to consider steroids at this time.  Recs: - will follow up MRI C,T,L spine with and without contrast. - cEEG when he is transferred to [facility]. - further workup pending above.  [place] Pager Number [phone]

## 2022-06-28 NOTE — Progress Notes (Signed)
PT Cancellation Note  Patient Details Name: QUADARIUS HENTON MRN: [id number] DOB: 24-Sep-1946   Cancelled Treatment:    Reason Eval/Treat Not Completed: Medical issues which prohibited therapy. Will check back another   day.   [place] Office [phone] Weekend pager-[phone]  Claretha Cooper 06/28/2022, 2:50 PM

## 2022-06-28 NOTE — Progress Notes (Addendum)
[facility] for Infectious Disease  Date of Admission:  06/14/2022   Total days of inpatient antibiotics 3  Principal Problem:   Acute metabolic encephalopathy Active Problems:   HLD (hyperlipidemia)   Paroxysmal atrial fibrillation (HCC)   Chronic lymphocytic leukemia (HCC)   Recurrent pulmonary embolism (HCC)   S/P Bioprosthetic AVR (aortic valve replacement) in 2011   History of pulmonary embolism   Pancytopenia (HCC)   AKI (acute kidney injury) (HCC)   Severe aortic stenosis   Chronic diastolic CHF (congestive heart failure) (HCC)   Orthostatic hypotension          Assessment: 76 year old male with CLL, recent admission for acute encephalopathy discharged 8/10 with bone marrow biopsy showing minor B-cell population.  Found down in car and admitted with: #Acute encephalopathy #Persistent fevers #Pancytopenia #Elevated INR on heparin -MRI brain showed no acute intracranial process - Chest x-ray was negative - CT head negative, - Blood cultures remain negative -[name] oncology following.  Given persistent temperatures recommended spinal tap for concern for infection.  May have autoimmune component given neutrophils and thrombocytopenia.  Patient has not had Rituxan for several months. IF cns infiltration present by lymphoma, need to pull back to pursue comfort care.  -Per primary LP requested with IR but they are unable to do it due to coagulopathy and low platelets.  PCCM was consulted and recommended transfer to [facility] stepdown for LP by neurology.  Patient has been given platelets and vitamin K -Seen by neurology on 8/16 EEG was within normal limits. Neurology following and  ordered MRI spine to look for leptomeningeal involvement, consider high dose sterids if continues to deteriorate, LP with plt 100k and INR <1.4 Etiology: Cause of fever could be 2/2 lymphoma vs autoimmune vs less likely bacterial meningitis. Given CLL and bone bx+ b-cell population  his symptoms could be 2/2 lymphoma. He is not classically presenting as meningitis pt (no HA, neck stiffness, encephalopathy has been ongoing since last admission), in addition fevers curve has not improved on antibiotics.  Recommendations:  - Continue Vanc+ceftazidime  -  D/C ampicillin as fever curve is unchanged.  -  Continue doxycyline             I ordered RMSF, Lyme, anaplasma serologies. On 7/5 pt had seen PCP for crotoal lesion 1 weeks after pulling a tick off of him. NO doxy Rx. On 8/3 Lyme, RMSF ehrlechia were not indicative of tick born infection. He could have been re-exposed to tick as such will add doxy for coverage while awaiting serologies.  -If LP feasible please obtain  cell count, ptn, glucose, cytology, HSV, VZV, bacterial/fungal/afb Cx, freeze sample -Follow blood Cx   Dr. Juleen China will be covering the weekend.   Microbiology:   Antibiotics: Acyclovir 8/15-present Ampicillin HR 60 depression Ceftazidime 8/16-present Vancomycin 8/16-p Meropenem 8/15   Cultures: Blood 8/14 no growth SUBJECTIVE: Resting in bed. Less alert this AM.  Interval: Tmax of 102.8, wbc 4.4, plt 31  Review of Systems: ROS   Scheduled Meds:  acyclovir  400 mg Oral Daily   amiodarone  150 mg Intravenous Once   Chlorhexidine Gluconate Cloth  6 each Topical Daily   cholecalciferol  2,000 Units Oral q morning   docusate sodium  100 mg Oral Daily   doxycycline  100 mg Oral Q12H   midodrine  2.5 mg Oral TID WC   mirtazapine  15 mg Oral QHS   multivitamin with minerals  1  tablet Oral Daily   rosuvastatin  5 mg Oral Daily   sertraline  100 mg Oral q morning   sodium chloride flush  10-40 mL Intracatheter Q12H   [START ON 07/03/2022] thiamine (VITAMIN B1) injection  100 mg Intravenous Daily   Continuous Infusions:  sodium chloride 125 mL/hr at 06/28/22 0332   sodium chloride Stopped (06/27/22 2002)   amiodarone     ampicillin (OMNIPEN) IV 2 g (06/28/22 0645)   cefTAZidime (FORTAZ)  IV 2  g (06/28/22 0645)   heparin 1,250 Units/hr (06/28/22 0546)   phenylephrine (NEO-SYNEPHRINE) Adult infusion 175 mcg/min (06/28/22 0606)   thiamine (VITAMIN B1) injection Stopped (06/27/22 1335)   vancomycin Stopped (06/27/22 1355)   PRN Meds:.acetaminophen, levalbuterol, metoprolol tartrate, ondansetron **OR** ondansetron (ZOFRAN) IV, mouth rinse, senna-docusate, sodium chloride flush, traMADol Allergies  Allergen Reactions   Hydrocodone-Acetaminophen Shortness Of Breath   Oxycodone Hcl Shortness Of Breath   Telmisartan-Hctz Shortness Of Breath, Palpitations and Other (See Comments)    Dizziness also   Aspirin Rash and Other (See Comments)    Confusion, also- Cannot take high doses   Atorvastatin Other (See Comments)    Myalgia    Buspirone Hcl Other (See Comments)    Headaches    Codeine Hives, Nausea And Vomiting and Other (See Comments)    Confusion also   Morphine Sulfate Itching   Paroxetine Other (See Comments)    Paresthesias: R arm, R leg, r side of face   Rabeprazole Other (See Comments)    headache   Ramipril Other (See Comments)    Unknown reaction    OBJECTIVE: Vitals:   06/28/22 0220 06/28/22 0400 06/28/22 0720 06/28/22 0800  BP:      Pulse:      Resp:   (!) 24   Temp: 98.2 F (36.8 C) 100.1 F (37.8 C)  (!) 102.8 F (39.3 C)  TempSrc: Rectal Rectal  Rectal  SpO2:      Weight:      Height:       Body mass index is 23.57 kg/m.  Physical Exam Constitutional:      General: He is not in acute distress.    Appearance: He is normal weight. He is not toxic-appearing.  HENT:     Head: Normocephalic and atraumatic.     Right Ear: External ear normal.     Left Ear: External ear normal.     Nose: No congestion or rhinorrhea.     Mouth/Throat:     Mouth: Mucous membranes are moist.     Pharynx: Oropharynx is clear.  Eyes:     Extraocular Movements: Extraocular movements intact.     Conjunctiva/sclera: Conjunctivae normal.     Pupils: Pupils are equal,  round, and reactive to light.  Cardiovascular:     Rate and Rhythm: Normal rate and regular rhythm.     Heart sounds: No murmur heard.    No friction rub. No gallop.  Pulmonary:     Effort: Pulmonary effort is normal.     Breath sounds: Normal breath sounds.  Abdominal:     General: Abdomen is flat. Bowel sounds are normal.     Palpations: Abdomen is soft.  Musculoskeletal:        General: No swelling. Normal range of motion.     Cervical back: Normal range of motion and neck supple.  Skin:    General: Skin is warm and dry.  Neurological:     General: No focal deficit present.  Comments: Alert to name  Psychiatric:        Mood and Affect: Mood normal.       Lab Results Lab Results  Component Value Date   WBC 4.4 06/28/2022   HGB 5.1 (LL) 06/28/2022   HCT 15.6 (L) 06/28/2022   MCV 96.9 06/28/2022   PLT 31 (L) 06/28/2022    Lab Results  Component Value Date   CREATININE 1.99 (H) 06/28/2022   BUN 28 (H) 06/28/2022   NA 141 06/28/2022   K 4.1 06/28/2022   CL 120 (H) 06/28/2022   CO2 17 (L) 06/28/2022    Lab Results  Component Value Date   ALT 27 06/28/2022   AST 86 (H) 06/28/2022   ALKPHOS 87 06/28/2022   BILITOT 0.5 06/28/2022        Laurice Record, [facility]ious [facility] 06/28/2022, 8:27 AM

## 2022-06-28 NOTE — Progress Notes (Addendum)
Addendum:  CTA c/a/p reviewed with radiologist. Thankfully no AAS. Likely RUE hematoma, no arterial extrav.   Still to get 2 PRBC H/H after 2PRBC (& q6hr after x4)  If H/H appropriate response after product, plan is restart heparin gtt without bolus  Cont neo   _______________  Notified of critical lab result -- hgb 5.7 hct 17.4 -- this was a recheck of labs from earlier this morning  Persistent thrombocytopenia, worse this morning with plt 23   On heparin gtt for recurrent PE, afib, bioprosthetic aortic valve   NIBP variable in setting of rigors-- on my arrival to room SBP 169 but at time of departure SBP 90 No bloody/tarry BM, no hemoptysis, hematemesis Abd soft, sound diffusely tender to touch, but with worse pain localizing L flank. No bruising.  Appears critically ill, and is quite lethargic -- apt to believe SBP 90 is correfct  FAST exam (cardiac probe, no curvilinear) without obvious free fluid, though exam significantly limited by motion/pt cooperation  IVC quite collapsible    P -2 PRBC now (last T&S 2d ago, shouldn't need to resend) -resume neo -STAT CTA c/a/p  -hold heparin for now while we get more information

## 2022-06-28 NOTE — Plan of Care (Signed)
  Problem: Education: Goal: Knowledge of General Education information will improve Description: Including pain rating scale, medication(s)/side effects and non-pharmacologic comfort measures Outcome: Not Progressing   Problem: Nutrition: Goal: Adequate nutrition will be maintained Outcome: Not Progressing   Problem: Coping: Goal: Level of anxiety will decrease Outcome: Not Progressing   Problem: Pain Managment: Goal: General experience of comfort will improve Outcome: Not Progressing

## 2022-06-28 NOTE — TOC Progression Note (Signed)
Transition of Care [facility]) - Progression Note   Patient Details  Name: [name] MRN: [id number] Date of Birth: 1946/04/21  Transition of Care [facility]) CM/SW [place], LCSW Phone Number: 06/28/2022, 12:13 PM  Clinical Narrative: VA community screen checklist for SNF approval has been signed by physician. VA SNF request packet has been faxed [phone]) to [facility] for review. TOC awaiting approval from [place] for SNF.  Expected Discharge Plan: [place] Barriers to Discharge: Continued Medical Work up  Expected Discharge Plan and Services Expected Discharge Plan: [place] Discharge Planning Services: CM Consult Post Acute Care Choice: [place] arrangements for the past 2 months: [place]  Readmission Risk Interventions    06/26/2022    9:26 AM 06/16/2022    5:17 PM  Readmission Risk Prevention Plan  Transportation Screening Complete Complete  PCP or Specialist Appt within 3-5 Days  Complete  HRI or [place]  Complete  Social Work Consult for [place] Planning/Counseling  Complete  Palliative Care Screening  Not Applicable  Medication Review [employer]) Complete Referral to Pharmacy  PCP or Specialist appointment within 3-5 days of discharge Complete   HRI or [place] Complete   SW Recovery Care/Counseling Consult Complete   [place] Not Applicable

## 2022-06-28 NOTE — IPAL (Signed)
  Interdisciplinary Goals of Care Family Meeting   Date carried out: 06/28/2022  Location of the meeting: Phone conference  Member's involved: Nurse Practitioner and Family Member or next of kin, RN and patient   [place] or acting medical decision maker: Niece, [name]    Discussion: We discussed goals of care for [name] .  Discussed today's clinical events & my conversation with the patient in follow up this afternoon.   -- [name] is in immense pain. He has increasing size of his R axillary hematoma, and increased spontaneous bruising. Worse abdominal pain. He says very clearly he does not want to be here and he does not want to do this. With his primary nurse present we discussed this. In very plain terms we talked about aggressive vs symptom focussed care at the end of life. [name] wants hospice. I told [name] that I would call [name] and talk about this with her to which he agreed --  When I spoke with [name], upon telling her he was in pain she immediately and compassionately wanted to know how we can help alleviate that from [name]. I discussed PRN pain meds, and when I shared my discussion with [name] she was entirely supportive of shifting efforts to comfort care and shared that they ([name]) have talked about this, and if this is what he says this is what we will do.  [name] is leaving now to come to the hospital and will arrive this evening. She is clear that she does not want him to be in pain and we should not delay tx symptoms until her arrival.   I will place orders for comfort care, but we will continue pressors in hopes of facilitating [name]'s arrival. After her arrival, pressors off   Code status: Full DNR  Disposition: In-patient comfort care  Time spent for the meeting: 25 min    Cristal Generous, NP 06/28/2022, 3:32 PM

## 2022-06-28 NOTE — Progress Notes (Signed)
[facility] Progress Note Patient Name: [name] DOB: 1946-04-08 MRN: [id number]   Date of Service  06/28/2022  HPI/Events of Note  Hypotension Fever, Af-RVR on amio  eICU Interventions  Resume Neo  +10 L, will avoid fluids     Intervention Category Major Interventions: Hypotension - evaluation and management  [name] 06/28/2022, 12:52 AM

## 2022-06-28 NOTE — Progress Notes (Signed)
Spoke with Rod Holler about Plan of Care for this evening.  She understands pressors are helping to keep patient alive.  Rod Holler stated several family members were on their way to visit with patient.  Rod Holler asked that pressors continue until other family members arrive.  This RN explained that would not be a problem.

## 2022-06-28 NOTE — Progress Notes (Signed)
Additional family members have arrived and spent time with patient.  Current bag of Neo has run out and Rod Holler does not want to continue pressor medication.

## 2022-06-28 NOTE — Progress Notes (Signed)
Unfortunately, [name] is not getting better.  He still having temperatures.  I have a hard time believing the temperatures are from his leukemia.  His last bone marrow showed minimal disease.  I realize he has not had a CT of the body to see if there is disease that were not seen.  I suppose this is always possible.  For that the fevers might be of CNS etiology.  Regardless, I just have a bad feeling that his recovery just T may not happen.  He barely opens his eyes.  He really does not talk.  His labs do not look that great.  His renal function is deteriorating.  His BUN is 28 creatinine 1.99.  His calcium is 7.3.  His albumin is only 2.  There is no CBC back yet.  He is on antibiotics.  Again I do not think any cultures have been positive.  I suppose the fevers could always be of viral etiology.  Still be nice to try to get a lumbar puncture on him.  Again we do not know what is platelet count is.  He is on heparin as he needs chronic anticoagulation because of the prosthetic aortic valve.  I just really hate the fact that his decline has been so significant.  I know that he is trying.  He just seems to be wearing out.  His temperature was 100.3.  Pulse 90.  Blood pressure 98/39.  Oxygen saturation 98%.  His lungs sound pretty clear bilaterally.  He has decent air movement.  Cardiac exam regular rate and rhythm.  Abdomen is soft.  There is decreased bowel sounds.  There is no fluid wave.  There is no obvious abdominal mass.  There is no palpable liver or spleen tip.  Extremities shows some muscle atrophy in upper and lower extremities.  Neurological exam really shows no focal neurological deficits.  His cognitive state is quite poor.  At some point, I think going to have to try to get a spinal tap on him.  I realize that there is was at risk.  I really need to see if there is any lymphoma/CLL in the brain.  I realize the MRIs do not show anything.  If we do find that there is CNS  infiltration by CLL/lymphoma, then I think we clearly will need to pull back and try to pursue comfort care measures.  I know everybody is doing their best.  I just hate that he is declining.  Lattie Haw, MD  Psalm 91:1-2

## 2022-06-28 NOTE — Progress Notes (Signed)
ANTICOAGULATION CONSULT NOTE  Pharmacy Consult for heparin Indication: atrial fibrillation and pulmonary embolus - warfarin on hold  Allergies  Allergen Reactions   Hydrocodone-Acetaminophen Shortness Of Breath   Oxycodone Hcl Shortness Of Breath   Telmisartan-Hctz Shortness Of Breath, Palpitations and Other (See Comments)    Dizziness also   Aspirin Rash and Other (See Comments)    Confusion, also- Cannot take high doses   Atorvastatin Other (See Comments)    Myalgia    Buspirone Hcl Other (See Comments)    Headaches    Codeine Hives, Nausea And Vomiting and Other (See Comments)    Confusion also   Morphine Sulfate Itching   Paroxetine Other (See Comments)    Paresthesias: R arm, R leg, r side of face   Rabeprazole Other (See Comments)    headache   Ramipril Other (See Comments)    Unknown reaction    Patient Measurements: Height: '5\' 10"'$  (177.8 cm) Weight: 74.5 kg (164 lb 3.9 oz) IBW/kg (Calculated) : 73 Heparin Dosing Weight: n/a. Use TBW  Vital Signs: Temp: 98.7 F (37.1 C) (08/18 1315) Temp Source: Rectal (08/18 1315) BP: 107/29 (08/18 1330) Pulse Rate: 79 (08/18 1330)  Labs: Recent Labs    06/26/22 0503 06/27/22 0444 06/27/22 0758 06/27/22 1422 06/28/22 0612 06/28/22 0816 06/28/22 0948  HGB 10.2* 9.0*  --   --  5.1* 5.7*  --   HCT 32.2* 26.7*  --   --  15.6* 17.4*  --   PLT 37* 37*  --   --  31* 23* 20*  APTT  --   --   --   --   --   --  >200*  LABPROT 25.1* 17.8*  --   --  17.4*  --  19.0*  INR 2.3* 1.5*  --   --  1.4*  --  1.6*  HEPARINUNFRC 0.29*  --  0.49 0.73* 0.69  --   --   CREATININE 1.52* 1.43*  --   --  1.99*  --   --      Estimated Creatinine Clearance: 32.6 mL/min (A) (by C-G formula based on SCr of 1.99 mg/dL (H)).  Medications: Pt on warfarin PTA for atrial fibrillation and history of recurrent PE -Per notes from previous hospitalization and med reconciliation completed with niece, it is unclear if patient taking his warfarin  consistently  Information from most recent [facility] clinic note on 05/17/22: -Dose: Warfarin 15 mg Sun, Thurs; 10 mg all other days -INR goal: 2.5 - 3  Assessment: Pt is a 5 yoM on warfarin chronically for history of PE and atrial fibrillation. PMH also significant for CLL, not currently receiving chemotherapy (last dose on 04/11/22) and leukopenia/thrombocytopenia.   Pharmacy consulted to transition anticoagulation from warfarin to heparin drip in anticipation for invasive procedures.   Significant Events: - 8/18: Hgb decreased 9.0 >> 5.1 (verified 5.7) - heparin stopped, plan transfuse 2 units PRBC, CT c/a/p shows possible hematoma in the right axilla and right arm  Today, 06/28/22 INR = 1.6, remains subtherapeutic (warfarin on hold) s/p VitK 8/15 H/H as above; Plts continue to decline s/p NPlate 8/15 and 2 units Plts 8/16 Heparin level therapeutic at upper end of therapeutic range 0.69 with heparin infusing at 1250 units/hr this morning prior to heparin stopping  Goal of Therapy:  Heparin level 0.3-0.7 units/ml INR 2.5 - 3 Monitor platelets by anticoagulation protocol: Yes   Plan:  Holding heparin Per discussion with CCM, recheck H/H after transfusion complete and if  a good response, will consider resuming heparin without a bolus   Thank you for allowing pharmacy to be a part of this patient's care.  Peggyann Juba, PharmD, BCPS Pharmacy: [phone] 06/28/2022 1:40 PM

## 2022-06-29 DIAGNOSIS — G9341 Metabolic encephalopathy: Secondary | ICD-10-CM | POA: Diagnosis not present

## 2022-06-29 LAB — CULTURE, BLOOD (ROUTINE X 2)
Culture: NO GROWTH
Culture: NO GROWTH
Special Requests: ADEQUATE

## 2022-06-29 LAB — RMSF, IGG, IFA: RMSF, IGG, IFA: 1:128 {titer} — ABNORMAL HIGH

## 2022-06-29 LAB — ROCKY MTN SPOTTED FVR ABS PNL(IGG+IGM)
RMSF IgG: UNDETERMINED
RMSF IgM: 0.71 index (ref 0.00–0.89)

## 2022-06-29 NOTE — Progress Notes (Signed)
   NAME:  [name], MRN:  [id number], DOB:  [birth year], LOS: 4 ADMISSION DATE:  07/08/2022, CONSULTATION DATE: 06/26/2022 REFERRING MD: Dr. Darrick Meigs, CHIEF COMPLAINT: Altered mental status  History of Present Illness:  [name] is a 76 y.o. male with a PMH significant for but not limited to HTN, HLD, bioprosthetic aortic valve replacement on Coumadin, paroxysmal atrial fibrillation, right lower lobe resection, chronic lymphocytic leukemia, mitral regurgitation, and anxiety who presented to the emergency department 8/14 for complaints of altered mental status.  Of note patient was recently hospitalized at this facility 06/14/2022 through 06/20/2022 for altered mental status, AKI, and neutropenia.  Patient underwent bone marrow biopsy with oncology which showed minimal CLL.    Patient presented for outpatient oncology appointment 8/14 and was found minimally responsive in parking lot prompting EMS call.    On ED arrival patient was seen altered slight improvement seen with intranasal Narcan.  CT head negative.  Lab work significant for sodium 134, creatinine 1.35 alkaline phosphatase 144, high-sensitivity troponin 20 WBC 3.0, platelets 84, INR 2.3. Patient was admitted per [facility] service for management admission.  PCCM consulted 8/16 for assistance in obtaining LP to rule out underlying meningitis.   Pertinent  Medical History  HTN, HLD, bioprosthetic aortic valve replacement on Coumadin, paroxysmal atrial fibrillation, CKD stage IIIb, right lower lobe resection, chronic lymphocytic leukemia, mitral regurgitation, and anxiety  Significant Hospital Events: Including procedures, antibiotic start and stop dates in addition to other pertinent events   8/14 admitted with altered mental status 8/16 PCCM consulted for assistance in obtaining LP 8/17-fever overnight 8/19  Interim History / Subjective:  No overnight events Comfort measures  Objective   Blood pressure (!) 105/93, pulse 89, temperature  99.5 F (37.5 C), temperature source Axillary, resp. rate 18, height $RemoveBe'5\' 10"'[id number]$  (1.778 m), weight 74.5 kg, SpO2 93 %.        Intake/Output Summary (Last 24 hours) at 06/29/2022 1131 Last data filed at 06/29/2022 0630 Gross per 24 hour  Intake 1357.3 ml  Output 750 ml  Net 607.3 ml   Filed Weights   06/26/22 0500 06/26/22 1419 06/27/22 0500  Weight: 78.1 kg 74.6 kg 74.5 kg    Examination: General: Elderly gentleman, chronically ill-appearing HENT: Moist oral mucosa Lungs: Clear breath sounds bilaterally Cardiovascular: S1-S2 appreciated Abdomen: Bowel sounds appreciated Extremities: No clubbing, no edema Neuro: Arousable GU: Fair output  No new labs  Resolved Hospital Problem list     Assessment & Plan:   Encephalopathy Meningoencephalitis Fever Blood loss anemia Acute kidney injury Atrial fibrillation with RVR Severe aortic stenosis with bioprosthetic AVR Chronic diastolic congestive heart failure History of CLL Anemia  Patient was transitioned to comfort measures only on 8/18 We will continue interventions to ensure that he is comfortable Antibiotics, antivirals, anticoagulation discontinued  Discussed with his niece at bedside  We will transfer to [place] floor  Sherrilyn Rist, MD [place] PCCM Pager: See Shea Evans

## 2022-06-29 NOTE — Progress Notes (Signed)
  Progress Note   Date: 06/27/2022  Patient Name: [name]        MRN#: [id number]  Clarification of diagnosis:Chronic diastolic heart failure

## 2022-06-29 NOTE — Progress Notes (Signed)
Unfortunately, I think [name] has declared himself.  He really took a decline yesterday.  He had worsening neurological changes.  He had pressure problems.  It was now decided to just focus on comfort care.  He, himself, mention to the doctors that he did not wish to continue therapy.  I really cannot argue with this.  It is just difficult not knowing exactly what the problem has been with his mental state.  We have never been able to do a lumbar puncture on him because of his thrombocytopenia.  Yesterday, his hemoglobin was 5.7.  Platelet count 23,000.  It is as if his body is shutting down.  He seems comfortable.  He does open his eyes a little bit to me.  I just never would have imagine that we would be dealing with this right now.  He actually did have a very nice response to treatment for the CLL.  I suspect there is some autoimmune component that is affecting his white cells and platelets.  It is amazing that his red cell count dropped so quickly.  I do not think he is bleeding.  I am not sure if he will make it through the weekend.  Again, he is comfortable.  I know that my staff in the office really enjoyed [name].  He has always been so open and so willing to talk to everybody.  He is truly been in inspiration to [name].  Lattie Haw, MD  2Timothy 4:16-18

## 2022-06-30 DIAGNOSIS — D693 Immune thrombocytopenic purpura: Secondary | ICD-10-CM | POA: Diagnosis not present

## 2022-06-30 DIAGNOSIS — D709 Neutropenia, unspecified: Secondary | ICD-10-CM | POA: Diagnosis not present

## 2022-06-30 DIAGNOSIS — R5081 Fever presenting with conditions classified elsewhere: Secondary | ICD-10-CM

## 2022-06-30 DIAGNOSIS — D61818 Other pancytopenia: Secondary | ICD-10-CM | POA: Diagnosis not present

## 2022-06-30 DIAGNOSIS — S40029A Contusion of unspecified upper arm, initial encounter: Secondary | ICD-10-CM

## 2022-06-30 DIAGNOSIS — G0490 Encephalitis and encephalomyelitis, unspecified: Secondary | ICD-10-CM | POA: Diagnosis not present

## 2022-06-30 DIAGNOSIS — G9341 Metabolic encephalopathy: Secondary | ICD-10-CM | POA: Diagnosis not present

## 2022-06-30 LAB — CBC WITH DIFFERENTIAL/PLATELET
Abs Immature Granulocytes: 0.27 10*3/uL — ABNORMAL HIGH (ref 0.00–0.07)
Basophils Absolute: 0 10*3/uL (ref 0.0–0.1)
Basophils Relative: 1 %
Eosinophils Absolute: 0 10*3/uL (ref 0.0–0.5)
Eosinophils Relative: 0 %
HCT: 17.4 % — ABNORMAL LOW (ref 39.0–52.0)
Hemoglobin: 5.7 g/dL — CL (ref 13.0–17.0)
Immature Granulocytes: 9 %
Lymphocytes Relative: 21 %
Lymphs Abs: 0.7 10*3/uL (ref 0.7–4.0)
MCH: 31.5 pg (ref 26.0–34.0)
MCHC: 32.8 g/dL (ref 30.0–36.0)
MCV: 96.1 fL (ref 80.0–100.0)
Monocytes Absolute: 0.4 10*3/uL (ref 0.1–1.0)
Monocytes Relative: 12 %
Neutro Abs: 1.8 10*3/uL (ref 1.7–7.7)
Neutrophils Relative %: 57 %
Platelets: 23 10*3/uL — CL (ref 150–400)
RBC: 1.81 MIL/uL — ABNORMAL LOW (ref 4.22–5.81)
RDW: 15.2 % (ref 11.5–15.5)
WBC: 3.2 10*3/uL — ABNORMAL LOW (ref 4.0–10.5)
nRBC: 0 % (ref 0.0–0.2)

## 2022-06-30 LAB — A. PHAGOCYTOPHILUM PCR: A. phagocytophilum PCR: NEGATIVE

## 2022-06-30 MED ORDER — LORAZEPAM 2 MG/ML IJ SOLN
1.0000 mg | INTRAMUSCULAR | Status: DC | PRN
  Administered 2022-06-30: 1 mg via INTRAVENOUS
  Filled 2022-06-30: qty 1

## 2022-06-30 MED ORDER — FENTANYL BOLUS VIA INFUSION
100.0000 ug | INTRAVENOUS | Status: DC | PRN
  Administered 2022-06-30 – 2022-07-02 (×5): 100 ug via INTRAVENOUS

## 2022-06-30 MED ORDER — FENTANYL 2500MCG IN NS 250ML (10MCG/ML) PREMIX INFUSION
100.0000 ug/h | INTRAVENOUS | Status: DC
  Administered 2022-06-30: 50 ug/h via INTRAVENOUS
  Administered 2022-07-01 – 2022-07-02 (×2): 100 ug/h via INTRAVENOUS
  Filled 2022-06-30 (×5): qty 250

## 2022-06-30 NOTE — Progress Notes (Signed)
  Daily Progress Note   Patient Name: [name]       Date: 06/30/2022 DOB: 1946-06-21  Age: 76 y.o. MRN#: [id number] Attending Physician: Samuella Cota, MD Primary Care Physician: [name], MD Admit Date: 06/22/2022 Length of Stay: 5 days  Reason for Consultation/Follow-up: Terminal Care HPI/Patient Profile:  [name] is a 76 y.o. male with medical history significant of CLL,  bioprosthetic aortic valve replacement on Coumadin, CKD stage IIIb,recurrent PE on Coumadin, of paroxysmal atrial fibrillation, recent hospitalization from 06/14/2022 - 06/20/2022 for altered mental status, AKI and neutropenia.  [name] is being follow by [name] of Oncology.    Patient transitioned to comfort care on 8/19.  Subjective:   Subjective: Chart Reviewed. Updates received. Patient Assessed.  [name] is unresponsive, just received IV pain meds-appears comfortable. Friends gathered at bedside.  Objective:   Vital Signs:  BP (!) 84/53 (BP Location: Left Arm)   Pulse 84   Temp 98.6 F (37 C) (Oral)   Resp 18   Ht '5\' 10"'$  (1.778 m)   Wt 74.5 kg   SpO2 (!) 88%   BMI 23.57 kg/m   Physical Exam:Palliative Assessment/Data: 10%   Assessment & Plan:   Impression: Present on Admission:  Acute metabolic encephalopathy  Recurrent pulmonary embolism (HCC)  Paroxysmal atrial fibrillation (HCC)  Pancytopenia (HCC)  Orthostatic hypotension  AKI (acute kidney injury) ([place])  Chronic diastolic CHF (congestive heart failure) (HCC)  Chronic lymphocytic leukemia (HCC)  HLD (hyperlipidemia)  History of pulmonary embolism  SUMMARY OF RECOMMENDATIONS   Patient has received 3 PRN doses consecutively since this AM. Will start continuous infusion at 49mg/hr with bolus. Other Palliative Prophylaxis orders in place. RN reports pain with urination-will place foley.  Prognosis: Hospital death-hours likely  Thank you for allowing [facility] participate in the care of SEsaiah WanlessHiggs PMT will continue  to support holistically.  Time Total: 35 min  Visit consisted of counseling and education dealing with the complex and emotionally intense issues of symptom management and palliative care in the setting of serious and potentially life-threatening illness. Greater than 50%  of this time was spent counseling and coordinating care related to the above assessment and plan.  ELane Hacker DO Palliative Medicine  Team Phone # [phone](Nights/Weekends)  07/10/2021, 8:17 AM

## 2022-06-30 NOTE — Hospital Course (Signed)
76 y.o. male with medical history significant of CLL,  bioprosthetic aortic valve replacement on Coumadin, CKD stage IIIb,recurrent PE on Coumadin, of paroxysmal atrial fibrillation, recent hospitalization from 06/14/2022 - 06/20/2022 presented to oncology/Dr. Antonieta Pert office for follow-up.  He was found in the parking lot unresponsive at [facility] and subsequently taken to the ED. Admitted for acute encephalopathy.  Seen by neurology and oncology.  Etiology of encephalopathy unclear, toxic, metabolic considered.  Patient failed to improve developed fever and was started on antibiotics and treated empirically for possible meningeal encephalitis.  LP was desired but not obtainable secondary to coagulopathy and thrombocytopenia.  Eventually ended up transferring to ICU and critical care service.  Failed to improve, followed by neurology, infectious disease and critical care.  Ultimately plans were made for comfort care given failure to improve.  Continues on fentanyl infusion to manage pain.  Inpatient death anticipated.

## 2022-06-30 NOTE — Progress Notes (Signed)
Progress Note   Patient: [name] NGE:[id number] DOB: June 05, 1946 DOA: 06/17/2022     5 DOS: the patient was seen and examined on 06/30/2022   Brief hospital course: 76 y.o. male with medical history significant of CLL,  bioprosthetic aortic valve replacement on Coumadin, CKD stage IIIb,recurrent PE on Coumadin, of paroxysmal atrial fibrillation, recent hospitalization from 06/14/2022 - 06/20/2022 for altered mental status, AKI and neutropenia.  During this hospitalization, oncology was consulted and patient underwent bone marrow biopsy; treated with Granix for neutropenia by oncology and recommended outpatient follow-up with oncology.  Ammonia, B12 and TSH levels were normal.  MRI of brain was unrevealing; there was a question of seizure-like activity while getting an MRI of brain; EEG was negative for seizures; neurology evaluated the patient and subsequently signed off.  This was attributed to orthostatic hypotension and patient was prescribed midodrine.  He was subsequently discharged to SNF on 06/20/2022.  He presented today to oncology/Dr. Antonieta Pert office for follow-up.  He was found in the parking lot unresponsive at [facility] and subsequently taken to the ED. Admitted for acute encephalopathy.  Seen by neurology and oncology.  Etiology of encephalopathy unclear, toxic, metabolic considered.  Patient failed to improve developed fever and was started on antibiotics and treated empirically for possible meningeal encephalitis.  LP was desired but not obtainable secondary to coagulopathy and thrombocytopenia.  Eventually ended up transferring to ICU and critical care service.  Failed to improve, followed by neurology, infectious disease and critical care.  Ultimately plans were made for comfort care given failure to improve.    Assessment and Plan: Acute encephalopathy, toxic metabolic Presumed meningoencephalitis Neutropenic fever --Seen by neurology, oncology infectious disease and  critical care.  Brain MRI unremarkable..  LP avoided secondary to thrombocytopenia and coagulopathy.  Repeat EEG unremarkable.  Urine drug screen negative.  -- Failed to improve despite broad-spectrum antibiotics.  Subsequently made comfort care.  Atrial fibrillation with rapid ventricular response treated with amiodarone infusion.  AKI baseline creatinine 1.05 -Worsening on last check.  Continue comfort care.-  Pancytopenia, severe normocytic anemia -- Etiology unclear.  Right axilla hematoma Severe aortic stenosis status post bioprosthetic AVR CLL, pancytopenia PMH pulmonary embolism, PAF on warfarin Acute urinary retention Chronic diastolic CHF ITP treated with IVIG and steroids. Aortic atherosclerosis  Continue comfort care     Subjective:  Nonverbal Niece reports seems to be in pain at times  Physical Exam: Vitals:   06/29/22 1600 06/29/22 1911 06/30/22 0519 06/30/22 0523  BP:  (!) 182/171 (!) 84/53   Pulse: 91 89 68 84  Resp: 18  18   Temp:  98.9 F (37.2 C) 98.6 F (37 C)   TempSrc:  Oral Oral   SpO2: (!) 87% (!) 89%  (!) 88%  Weight:      Height:       Physical Exam Vitals reviewed.  Constitutional:      Appearance: Normal appearance.     Comments: Resting comfortably  Cardiovascular:     Rate and Rhythm: Normal rate and regular rhythm.     Heart sounds: No murmur heard. Pulmonary:     Effort: Pulmonary effort is normal. No respiratory distress.     Breath sounds: No wheezing, rhonchi or rales.  Musculoskeletal:     Right lower leg: No edema.     Left lower leg: No edema.     Comments: Bilateral UE edema noted     Data Reviewed:  There are no new results to  review at this time.  Family Communication: niece Rod Holler at bedside  Disposition: Status is: Inpatient Remains inpatient appropriate because: comfort care, inpatient death anticipated  Planned Discharge Destination:  TBD    Time spent: 35 minutes  Author: Murray Hodgkins,  MD 06/30/2022 5:29 PM  For on call review [email or web address].

## 2022-07-01 DIAGNOSIS — D61818 Other pancytopenia: Secondary | ICD-10-CM | POA: Diagnosis not present

## 2022-07-01 DIAGNOSIS — G9341 Metabolic encephalopathy: Secondary | ICD-10-CM | POA: Diagnosis not present

## 2022-07-01 DIAGNOSIS — G0490 Encephalitis and encephalomyelitis, unspecified: Secondary | ICD-10-CM | POA: Diagnosis not present

## 2022-07-01 DIAGNOSIS — N179 Acute kidney failure, unspecified: Secondary | ICD-10-CM | POA: Diagnosis not present

## 2022-07-01 NOTE — Progress Notes (Signed)
  Progress Note   Patient: [name] BJS:[id number] DOB: Oct 26, 1946 DOA: 07/01/2022     6 DOS: the patient was seen and examined on 07/01/2022   Brief hospital course: 76 y.o. male with medical history significant of CLL,  bioprosthetic aortic valve replacement on Coumadin, CKD stage IIIb,recurrent PE on Coumadin, of paroxysmal atrial fibrillation, recent hospitalization from 06/14/2022 - 06/20/2022 presented to oncology/Dr. Antonieta Pert office for follow-up.  He was found in the parking lot unresponsive at [facility] and subsequently taken to the ED. Admitted for acute encephalopathy.  Seen by neurology and oncology.  Etiology of encephalopathy unclear, toxic, metabolic considered.  Patient failed to improve developed fever and was started on antibiotics and treated empirically for possible meningeal encephalitis.  LP was desired but not obtainable secondary to coagulopathy and thrombocytopenia.  Eventually ended up transferring to ICU and critical care service.  Failed to improve, followed by neurology, infectious disease and critical care.  Ultimately plans were made for comfort care given failure to improve.    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 Brain MRI unremarkable.  LP avoided deferred due to thrombocytopenia and coagulopathy.  Repeat EEG unremarkable. Urine drug screen negative.  -- Failed to improve despite broad-spectrum antibiotics. Subsequently made comfort care.   Atrial fibrillation with rapid ventricular response treated with amiodarone infusion.   AKI baseline creatinine 1.05 -Worsening on last check.  Continue comfort care.   Pancytopenia, severe normocytic anemia -- Etiology unclear.   Right axilla hematoma Severe aortic stenosis status post bioprosthetic AVR CLL, pancytopenia PMH pulmonary embolism, PAF on warfarin Acute  urinary retention Chronic diastolic CHF ITP treated with IVIG and steroids. Aortic atherosclerosis   Continue comfort care      Subjective:  Sleeping   Physical Exam: Vitals:   06/29/22 1911 06/30/22 0519 06/30/22 0523 07/01/22 0445  BP: (!) 182/171 (!) 84/53  (!) 76/52  Pulse: 89 68 84 94  Resp:  18  18  Temp: 98.9 F (37.2 C) 98.6 F (37 C)  100 F (37.8 C)  TempSrc: Oral Oral  Oral  SpO2: (!) 89%  (!) 88% (!) 83%  Weight:      Height:       Physical Exam Vitals reviewed.  Constitutional:      General: He is not in acute distress.    Comments: Sleeping, does not arouse  Cardiovascular:     Rate and Rhythm: Normal rate and regular rhythm.     Heart sounds: Murmur heard.  Pulmonary:     Effort: Pulmonary effort is normal. No respiratory distress.  Musculoskeletal:     Right lower leg: No edema.     Left lower leg: No edema.     Comments: BUE edema noted     Data Reviewed:  There are no new results to review at this time.  Family Communication: updated Darnelle Catalan by telephone  Disposition: Status is: Inpatient Remains inpatient appropriate because: inpatient death expected  Planned Discharge Destination:  TBD    Time spent: 20 minutes  Author: Murray Hodgkins, MD 07/01/2022 3:46 PM  For on call review [email or web address].

## 2022-07-01 NOTE — Progress Notes (Signed)
[name] is now up on 6 E.  He is on a fentanyl drip.  He barely opens his eyes.  His breathing is slow and shallow.  He appears to be comfortable.  We are keeping him until he does pass on.  It is hard to say when this might occur.  He has had no obvious bleeding.  There is a low-grade temperature.  His blood pressure is 76/52.  Oxygen saturation is 83%.  On his exam, his lungs sound with some scattered rhonchi.  He has decent air movement bilaterally.  Cardiac exam regular rate and rhythm.  He has no murmurs.  Abdomen is soft.  Bowel sounds are decreased.  He has no fluid wave.  Extremity shows no clubbing, cyanosis or edema.  I really would not expect [name] to survive much longer.  He really dropped his blood counts over the weekend.  He is no longer on colony-stimulating factor.  I know he is had incredible care from everybody throughout the hospital system.  I know that he also has had a very strong faith.  Lattie Haw, MD  2 Timothy 4:16-18

## 2022-07-01 NOTE — TOC Progression Note (Signed)
Transition of Care [facility]) - Progression Note    Patient Details  Name: [name] MRN: [id number] Date of Birth: [birth year]  Transition [facility] CM/SW Contact  Ross Ludwig, [place] Phone Number: 07/01/2022, 11:00 AM  Clinical Narrative:     CSW was informed that patient's niece [name] wanted to speak to CSW.  Patient's niece was asking about if VA needs to be contacted regarding patient.  CSW explained to her that he has [employer] and [employer], it does not matter while he is here.  CSW informed her that if patient improves enough to go to a hospice facility it is covered by insurance as well.  Patient's niece stated that she needs to go back to [place] today and wanted to make sure there is nothing else that needs to be done.  Patient's niece asked if patient will just stay here till he passes, CSW informed her it will be up to the physicians to decide and they will talk with her if they need to.  CSW alerted [facility] in case patient needs hospice facility.  CSW to continue to follow patient's progress throughout discharge planning.   Expected Discharge Plan: [place] Barriers to Discharge: Continued Medical Work up  Expected Discharge Plan and Services Expected Discharge Plan: [place]   Discharge Planning Services: CM Consult Post Acute Care Choice: [place] Living arrangements for the past 2 months: [place]                                       Social Determinants of Health (SDOH) Interventions    Readmission Risk Interventions    06/26/2022    9:26 AM 06/16/2022    5:17 PM  Readmission Risk Prevention Plan  Transportation Screening Complete Complete  PCP or Specialist Appt within 3-5 Days  Complete  HRI or [place]  Complete  Social Work Consult for [place] Planning/Counseling  Complete  Palliative Care Screening  Not Applicable  Medication Review [employer])  Complete Referral to Pharmacy  PCP or Specialist appointment within 3-5 days of discharge Complete   HRI or [place] Complete   SW Recovery Care/Counseling Consult Complete   [place] Not Applicable

## 2022-07-02 DIAGNOSIS — G9341 Metabolic encephalopathy: Secondary | ICD-10-CM | POA: Diagnosis not present

## 2022-07-02 DIAGNOSIS — R5081 Fever presenting with conditions classified elsewhere: Secondary | ICD-10-CM | POA: Diagnosis not present

## 2022-07-02 DIAGNOSIS — G0490 Encephalitis and encephalomyelitis, unspecified: Secondary | ICD-10-CM | POA: Diagnosis not present

## 2022-07-02 DIAGNOSIS — D709 Neutropenia, unspecified: Secondary | ICD-10-CM | POA: Diagnosis not present

## 2022-07-02 LAB — TYPE AND SCREEN
ABO/RH(D): B POS
Antibody Screen: NEGATIVE
Unit division: 0
Unit division: 0

## 2022-07-02 LAB — BPAM RBC
Blood Product Expiration Date: 202309032359
Blood Product Expiration Date: 202309032359
ISSUE DATE / TIME: 202308181317
Unit Type and Rh: 7300
Unit Type and Rh: 7300

## 2022-07-02 LAB — EHRLICHIA ANTIBODY PANEL
E chaffeensis (HGE) Ab, IgG: NEGATIVE
E chaffeensis (HGE) Ab, IgM: NEGATIVE
E. Chaffeensis (HME) IgM Titer: NEGATIVE
E.Chaffeensis (HME) IgG: NEGATIVE

## 2022-07-08 ENCOUNTER — Ambulatory Visit: Payer: Medicare Other | Admitting: Nurse Practitioner

## 2022-07-09 LAB — SURGICAL PATHOLOGY

## 2022-07-11 ENCOUNTER — Ambulatory Visit: Payer: Medicare Other | Admitting: Psychology

## 2022-07-12 NOTE — Death Summary Note (Signed)
DEATH SUMMARY   Patient Details  Name: [name] MRN: [id number] DOB: [birth year] YSA:[id number], Brayton Mars, MD Admission/Discharge Information   Admit Date:  07/11/2022  Date of Death: Date of Death: 2022/07/19  Time of Death: Time of Death: 22-Dec-1824  Length of Stay: 7   Principle Cause of death: Meningoencephalitis   Hospital Diagnoses: Principal Problem:   Acute metabolic encephalopathy Active Problems:   AKI (acute kidney injury) ([place])   Chronic lymphocytic leukemia ([place])   S/P Bioprosthetic AVR (aortic valve replacement) in Dec 22, 2009   History of pulmonary embolism   HLD (hyperlipidemia)   Paroxysmal atrial fibrillation (HCC)   Recurrent pulmonary embolism (HCC)   Pancytopenia (HCC)   Aortic atherosclerosis (HCC)   Severe aortic stenosis   Chronic diastolic CHF (congestive heart failure) (HCC)   Orthostatic hypotension   Meningoencephalitis   Neutropenic fever (HCC)   Hematoma of axilla   Acute ITP [facility])   Hospital Course: 76yom with medical history significant of CLL,  bioprosthetic aortic valve replacement on Coumadin, CKD stage IIIb,recurrent PE on Coumadin, of paroxysmal atrial fibrillation, recent hospitalization from 06/14/2022 - 06/20/2022 presented to oncology/Dr. Antonieta Pert office for follow-up.  He was found in the parking lot unresponsive at [facility] and subsequently taken to the ED. Admitted for acute encephalopathy.  Seen by neurology and oncology.  Etiology of encephalopathy unclear: toxic, metabolic considered.  Patient failed to improve developed fever and was started on antibiotics and treated empirically for possible meningoencephalitis.  LP was desired but not obtainable secondary to coagulopathy and thrombocytopenia.  Eventually ended up transferring to ICU and critical care service.  Failed to improve, followed by neurology, infectious disease and critical care.  Ultimately plans were made for comfort care given failure to improve.  Seen by palliative  care and started on fentanyl infusion to manage pain.  Inpatient death was anticipated.  Acute encephalopathy, toxic metabolic Presumed meningoencephalitis Neutropenic fever --Seen by neurology, oncology infectious disease and critical care. Brain MRI unremarkable.  LP avoided deferred due to thrombocytopenia and coagulopathy.  Repeat EEG unremarkable. Urine drug screen negative.  -- Failed to improve despite broad-spectrum antibiotics. Subsequently made comfort care.   Atrial fibrillation with rapid ventricular response treated with amiodarone infusion.   AKI baseline creatinine 1.05 -Worsening on last check.  Continue comfort care.   Pancytopenia, severe normocytic anemia -- Etiology unclear.   Right axilla hematoma Severe aortic stenosis status post bioprosthetic AVR CLL, pancytopenia PMH pulmonary embolism, PAF on warfarin Acute urinary retention Chronic diastolic CHF ITP treated with IVIG and steroids. Aortic atherosclerosis       Consultations:  Neurology Oncology ID PCCM  The results of significant diagnostics from this hospitalization (including imaging, microbiology, ancillary and laboratory) are listed below for reference.   Significant Diagnostic Studies: CT Angio Chest/Abd/Pel for Dissection W and/or W/WO  Result Date: 06/28/2022 CLINICAL DATA:  Chest pain or back pain hemoglobin drop EXAM: CT ANGIOGRAPHY CHEST, ABDOMEN AND PELVIS TECHNIQUE: Non-contrast CT of the chest was initially obtained. Multidetector CT imaging through the chest, abdomen and pelvis was performed using the standard protocol during bolus administration of intravenous contrast. Multiplanar reconstructed images and MIPs were obtained and reviewed to evaluate the vascular anatomy. RADIATION DOSE REDUCTION: This exam was performed according to the departmental dose-optimization program which includes automated exposure control, adjustment of the mA and/or kV according to patient size and/or use of  iterative reconstruction technique. CONTRAST:  169m OMNIPAQUE IOHEXOL 350 MG/ML SOLN COMPARISON:  None Available. FINDINGS: CTA  CHEST FINDINGS Cardiovascular: Mild cardiomegaly. No pericardial effusion. Prior aortic valve replacement. No significant coronary artery calcifications. No suspicious filling defects of the main pulmonary arteries. Right arm PICC with tip in the upper SVC. Mediastinum/Nodes: Esophagus and thyroid are unremarkable. No pathologically enlarged lymph nodes seen in the chest. Lungs/Pleura: Central airways are patent. Bilateral patchy ground-glass opacities. Small right-greater-than-left pleural effusions, simple appearing based on Hounsfield units, however streak artifact limits evaluation. Postsurgical changes of the right lower lobe. Musculoskeletal: No chest wall abnormality. No acute or significant osseous findings. Review of the MIP images confirms the above findings. CTA ABDOMEN AND PELVIS FINDINGS VASCULAR Aorta: Normal caliber aorta without aneurysm, dissection, vasculitis or significant stenosis. Moderate atherosclerotic disease with no significant stenosis. Celiac: Patent without evidence of aneurysm, dissection, or vasculitis. Moderate narrowing of the mid celiac artery due to atherosclerotic disease with poststenotic dilatation. SMA: Patent without evidence of aneurysm, dissection, vasculitis or significant stenosis. Renals: Both renal arteries are patent without evidence of aneurysm, dissection, vasculitis, fibromuscular dysplasia or significant stenosis. IMA: Patent, vessel is diminutive. Inflow: Patent without evidence of aneurysm, dissection, vasculitis or significant stenosis. Veins: No obvious venous abnormality within the limitations of this arterial phase study. Review of the MIP images confirms the above findings. NON-VASCULAR Hepatobiliary: No focal liver abnormality is seen. No gallstones, gallbladder wall thickening, or biliary dilatation. Pancreas: Unremarkable. No  pancreatic ductal dilatation or surrounding inflammatory changes. Spleen: Normal in size without focal abnormality. Adrenals/Urinary Tract: Bilateral adrenal glands are unremarkable. No hydronephrosis or nephrolithiasis. Heterogeneous enhancement of the left kidney. Bilateral low-attenuation renal lesions, largest are compatible with simple cysts, others are too small to completely characterize, no further follow-up imaging is recommended for these lesions. Bladder is unremarkable. Stomach/Bowel: Stomach is within normal limits. Appendix appears normal. No evidence of bowel wall thickening, distention, or inflammatory changes. Lymphatic: Prominent subcentimeter left periaortic lymph node measuring 0.8 mm on series 6, image 121. Prominent subcentimeter inguinal lymph nodes, largest measures 0.9 cm on series 6, image 193. Reproductive: Surgically absent prostate. Penile prosthetic device with ruptured reservoir. Other: Diffuse soft tissue anasarca.  Trace abdominal ascites. Musculoskeletal: No acute or significant osseous findings. Review of the MIP images confirms the above findings. IMPRESSION: 1. No evidence of acute aortic syndrome. 2. Large amount of soft tissue stranding is seen in the right axilla and right arm, concerning for hematoma. No evidence of arterial extravasation. 3. Patchy bilateral ground-glass opacities, likely due to pulmonary edema, although findings are nonspecific and infectious or inflammatory process could appear similar. 4. Small right-greater-than-left pleural effusions. 5. Heterogeneous enhancement of the left kidney, possibly artifactual and related to streak artifact, although infection can not be excluded. Correlate with urinalysis. 6. Prominent subcentimeter left para-aortic and inguinal lymph nodes, possibly reactive or related to patient's history of CLL. 7. Diffuse soft tissue anasarca. 8.  Aortic Atherosclerosis (ICD10-I70.0). Critical Value/emergent results were called by  telephone at the time of interpretation on 06/28/2022 at 1:18 pm to provider GRACE BOWSER , who verbally acknowledged these results. Electronically Signed   By: Yetta Glassman M.D.   On: 06/28/2022 13:18   DG Chest 1 View  Result Date: 06/27/2022 CLINICAL DATA:  Shortness of breath EXAM: CHEST  1 VIEW COMPARISON:  Portable exam 1004 hours compared 06/25/2022 FINDINGS: Normal heart size post AVR. RIGHT arm PICC line with tip projecting over SVC. Mediastinal contours and pulmonary vascularity normal. Atherosclerotic calcification aorta. Minimal RIGHT mid lung scarring. Lungs otherwise clear. No pulmonary infiltrate, pleural effusion, or pneumothorax. Bones demineralized. IMPRESSION:  Post AVR with scarring RIGHT mid lung. No acute abnormalities. Aortic Atherosclerosis (ICD10-I70.0). Electronically Signed   By: Lavonia Dana M.D.   On: 06/27/2022 10:41   [name] EKG SITE RITE  Result Date: 06/26/2022 If Site Rite image not attached, placement could not be confirmed due to current cardiac rhythm.  EEG adult  Result Date: 06/26/2022 Lora Havens, MD     06/26/2022  2:43 PM Patient Name: JESUS NEVILLS MRN: [id number] Epilepsy Attending: Lora Havens Referring Physician/Provider: Lorenza Chick, MD Date: 06/26/2022 Duration: Patient history: 76 year old patient with CLL presents with altered mental status after being found unresponsive in a parking lot.  EEG to evaluate for seizure. Level of alertness: Awake, asleep AEDs during EEG study: None Technical aspects: This EEG study was done with scalp electrodes positioned according to the 10-20 International system of electrode placement. Electrical activity was reviewed with band pass filter of 1-70Hz, sensitivity of 7 uV/mm, display speed of 57m/sec with a 60Hz notched filter applied as appropriate. EEG data were recorded continuously and digitally stored.  Video monitoring was available and reviewed as appropriate. Description: The posterior dominant rhythm  consists of 7.5 Hz activity of moderate voltage (25-35 uV) seen predominantly in posterior head regions, symmetric and reactive to eye opening and eye closing. Sleep was characterized by vertex waves, sleep spindles (12 to 14 Hz), maximal frontocentral region. Hyperventilation and photic stimulation were not performed.   IMPRESSION: This study is within normal limits. No seizures or epileptiform discharges were seen throughout the recording. PLora Havens  MR BRAIN WO CONTRAST  Result Date: 06/26/2022 CLINICAL DATA:  Altered mental status, stroke suspected EXAM: MRI HEAD WITHOUT CONTRAST TECHNIQUE: Multiplanar, multiecho pulse sequences of the brain and surrounding structures were obtained without intravenous contrast. COMPARISON:  06/14/2022 MRI, correlation is also made with CT head 07/10/2022 FINDINGS: Brain: No restricted diffusion to suggest acute or subacute infarct. No acute hemorrhage, mass mass effect, midline shift. No hydrocephalus or extra-axial collection. T2 hyperintense signal in the periventricular white matter, likely the sequela of mild-to-moderate chronic small vessel ischemic disease. Vascular: Normal arterial flow voids. Skull and upper cervical spine: Normal marrow signal. Sinuses/Orbits: Mucosal thickening in the left sphenoid sinus. Status post bilateral lens replacements. Other: The mastoids are well aerated. IMPRESSION: No acute intracranial process. No evidence of acute or subacute infarct. Electronically Signed   By: AMerilyn BabaM.D.   On: 06/26/2022 02:08   DG CHEST PORT 1 VIEW  Result Date: 06/25/2022 CLINICAL DATA:  Dyspnea, history of leukemia EXAM: PORTABLE CHEST 1 VIEW COMPARISON:  None Available. FINDINGS: Sternotomy wires overlie normal cardiac silhouette. Normal pulmonary vasculature. No effusion, infiltrate, or pneumothorax. No acute osseous abnormality. IMPRESSION: No acute cardiopulmonary process. Electronically Signed   By: SSuzy BouchardM.D.   On: 06/25/2022  13:59   CT Head Wo Contrast  Result Date: 06/21/2022 CLINICAL DATA:  Mental status change, unknown cause on coumadin, here for LOC - evaluation for ICH EXAM: CT HEAD WITHOUT CONTRAST TECHNIQUE: Contiguous axial images were obtained from the base of the skull through the vertex without intravenous contrast. RADIATION DOSE REDUCTION: This exam was performed according to the departmental dose-optimization program which includes automated exposure control, adjustment of the mA and/or kV according to patient size and/or use of iterative reconstruction technique. COMPARISON:  CT head 06/13/2022. FINDINGS: Brain: No evidence of acute infarction, hemorrhage, hydrocephalus, extra-axial collection or mass lesion/mass effect. Chronic microvascular ischemic disease and cerebral atrophy. Vascular: No hyperdense vessel.  Skull: No acute fracture. Sinuses/Orbits: Left sphenoid no evidence otherwise, visualized sinuses are clear. No acute orbital findings. Other: No mastoid effusions. IMPRESSION: Of acute intracranial abnormality. Electronically Signed   By: Margaretha Sheffield M.D.   On: 07/06/2022 10:40   DG Chest Portable 1 View  Result Date: 07/07/2022 CLINICAL DATA:  Syncope EXAM: PORTABLE CHEST 1 VIEW COMPARISON:  06/13/2022 FINDINGS: The heart size and mediastinal contours are within normal limits for technique. Both lungs are clear. No pleural effusion or pneumothorax. The visualized skeletal structures are unremarkable. IMPRESSION: No active disease. Electronically Signed   By: Macy Mis M.D.   On: 07/06/2022 10:27   EEG adult  Result Date: 06/18/2022 Lora Havens, MD     06/18/2022  8:29 AM Patient Name: DUANTE AROCHO MRN: [id number] Epilepsy Attending: Lora Havens Referring Physician/Provider: Mercy Riding, MD Date: 06/17/2022 Duration: 21.06 mins Patient history: 76 year old male with altered mental status.  EEG to evaluate for seizure. Level of alertness: Awake AEDs during EEG study: None Technical  aspects: This EEG study was done with scalp electrodes positioned according to the 10-20 International system of electrode placement. Electrical activity was reviewed with band pass filter of 1-70Hz, sensitivity of 7 uV/mm, display speed of 74m/sec with a 60Hz notched filter applied as appropriate. EEG data were recorded continuously and digitally stored.  Video monitoring was available and reviewed as appropriate. Description: The posterior dominant rhythm consists of 6 Hz activity of moderate voltage (25-35 uV) seen predominantly in posterior head regions, symmetric and reactive to eye opening and eye closing. EEG showed continuous generalized 5 to 6 Hz theta slowing. Hyperventilation and photic stimulation were not performed.   ABNORMALITY - Continuous slow, generalized IMPRESSION: This study is suggestive of moderate diffuse encephalopathy, nonspecific etiology. No seizures or epileptiform discharges were seen throughout the recording. PLora Havens  MR LIVER W WO CONTRAST  Result Date: 06/15/2022 CLINICAL DATA:  Liver lesion. History of chronic lymphocytic leukemia. EXAM: MRI ABDOMEN WITHOUT AND WITH CONTRAST TECHNIQUE: Multiplanar multisequence MR imaging of the abdomen was performed both before and after the administration of intravenous contrast. CONTRAST:  7.842mGADAVIST GADOBUTROL 1 MMOL/ML IV SOLN COMPARISON:  Abdominal ultrasound 06/14/2022. Abdominopelvic CT 02/09/2022. FINDINGS: Despite efforts by the technologist and patient, moderate to severe motion artifact is present on today's exam and could not be eliminated. This reduces exam sensitivity and specificity. The motion becomes progressively worse throughout the examination, limiting the postcontrast images. Lower chest: Trace right pleural effusion. Status post aortic valve replacement. Hepatobiliary: Liver assessment is limited by the motion, especially on the postcontrast images. Numerous small T2 hyperintense nodules are present  throughout the liver with mild restricted diffusion. These are not well seen on the limited postcontrast images. No concerning focal mass lesions are identified. No lesions were apparent in the liver on prior CT. No evidence of gallstones, gallbladder wall thickening or biliary dilatation. Pancreas: Unremarkable. No pancreatic ductal dilatation or surrounding inflammatory changes. Spleen: Stable mild splenomegaly without numerous small nonspecific nodules demonstrating T2 hypointensity. Adrenals/Urinary Tract: Both adrenal glands appear normal. Stable bilateral renal cysts which do not require imaging follow-up. No evidence of enhancing renal mass or hydronephrosis. Stomach/Bowel: The stomach appears unremarkable for its degree of distension. No evidence of bowel wall thickening, distention or surrounding inflammatory change. Vascular/Lymphatic: Small residual retroperitoneal and mesenteric lymph nodes are not pathologically enlarged. Aortic atherosclerosis, better seen on CT. No evidence of aneurysm or large vessel occlusion. No significant vascular findings. Other: No evidence  of abdominal wall hernia or ascites. Musculoskeletal: No acute or significant osseous findings. Susceptibility artifact in the back corresponding with small metallic foreign bodies on prior imaging. IMPRESSION: 1. Motion limited examination results in suboptimal evaluation of the liver. There are innumerable small T2 hyperintense lesions throughout the liver, but no focal lesions or abnormal enhancement identified on the postcontrast images. Findings may relate to regenerating nodules. Given the limitations of this examination, consider follow-up contrast enhanced CT. 2. Stable mild splenomegaly with scattered small nonspecific splenic nodules, likely benign. 3. No recurrent upper abdominal adenopathy. 4. No acute abdominal findings. Electronically Signed   By: Richardean Sale M.D.   On: 06/15/2022 09:23   MR BRAIN W WO CONTRAST  Result  Date: 06/14/2022 CLINICAL DATA:  Altered mental status.  Possible brain metastases. EXAM: MRI HEAD WITHOUT AND WITH CONTRAST TECHNIQUE: Multiplanar, multiecho pulse sequences of the brain and surrounding structures were obtained without and with intravenous contrast. CONTRAST:  7.41m GADAVIST GADOBUTROL 1 MMOL/ML IV SOLN COMPARISON:  09/02/2017 FINDINGS: Brain: No acute infarct, mass effect or extra-axial collection. No acute or chronic hemorrhage. There is multifocal hyperintense T2-weighted signal within the white matter. Generalized volume loss. There are old bilateral cerebellar small vessel infarcts. There is no abnormal contrast enhancement. Vascular: Major flow voids are preserved. Skull and upper cervical spine: Normal calvarium and skull base. Visualized upper cervical spine and soft tissues are normal. Sinuses/Orbits:No paranasal sinus fluid levels or advanced mucosal thickening. No mastoid or middle ear effusion. Normal orbits. IMPRESSION: 1. No intracranial metastatic disease. 2. Chronic small vessel ischemia and volume loss. Electronically Signed   By: KUlyses JarredM.D.   On: 06/14/2022 22:45   ECHOCARDIOGRAM COMPLETE  Result Date: 06/14/2022    ECHOCARDIOGRAM REPORT   Patient Name:   SMEER REINDLDate of Exam: 06/14/2022 Medical Rec #:  [id number]     Height:       70.0 in Accession #:    [id number]    Weight:       170.0 lb Date of Birth:  423-Jan-1947     BSA:          1.948 m Patient Age:    89years       BP:           93/58 mmHg Patient Gender: M              HR:           72 bpm. Exam Location:  Inpatient Procedure: 2D Echo, Color Doppler and Cardiac Doppler Indications:    R06.9 DOE  History:        Patient has prior history of Echocardiogram examinations, most                 recent 07/13/2021. Arrythmias:Atrial Fibrillation; Risk                 Factors:Hypertension and Dyslipidemia. Aortic Valve Replaced in                 2011.  Sonographer:    ERaquel SarnaSenior RDCS Referring Phys: [id number] 1. Left ventricular ejection fraction, by estimation, is 50%. The left ventricle has mildly decreased function. The left ventricle demonstrates global hypokinesis. There is mild left ventricular hypertrophy. Left ventricular diastolic parameters are consistent with Grade II diastolic dysfunction (pseudonormalization).  2. Right ventricular systolic function is normal. The right ventricular size is normal. There is normal pulmonary artery systolic pressure.  The estimated right ventricular systolic pressure is 73.4 mmHg.  3. Left atrial size was moderately dilated.  4. Right atrial size was mildly dilated.  5. The mitral valve is normal in structure. Trivial mitral valve regurgitation. No evidence of mitral stenosis.  6. Bioprosthetic aortic valve present, the valve appears thickened/calcified. Trivial aortic insufficiency. Mean gradient elevated at 47 mmHg suggesting severe stenosis. Dimensionless index 0.25, AVA 0.87 cm^2.  7. Aortic dilatation noted. There is mild dilatation of the aortic root, measuring 39 mm.  8. The inferior vena cava is normal in size with greater than 50% respiratory variability, suggesting right atrial pressure of 3 mmHg. FINDINGS  Left Ventricle: Left ventricular ejection fraction, by estimation, is 50%. The left ventricle has mildly decreased function. The left ventricle demonstrates global hypokinesis. The left ventricular internal cavity size was normal in size. There is mild left ventricular hypertrophy. Left ventricular diastolic parameters are consistent with Grade II diastolic dysfunction (pseudonormalization). Right Ventricle: The right ventricular size is normal. No increase in right ventricular wall thickness. Right ventricular systolic function is normal. There is normal pulmonary artery systolic pressure. The tricuspid regurgitant velocity is 2.71 m/s, and  with an assumed right atrial pressure of 3 mmHg, the estimated right ventricular systolic pressure is 28.7  mmHg. Left Atrium: Left atrial size was moderately dilated. Right Atrium: Right atrial size was mildly dilated. Pericardium: There is no evidence of pericardial effusion. Mitral Valve: The mitral valve is normal in structure. Trivial mitral valve regurgitation. No evidence of mitral valve stenosis. Tricuspid Valve: The tricuspid valve is normal in structure. Tricuspid valve regurgitation is mild. Aortic Valve: Bioprosthetic aortic valve present, the valve appears thickened/calcified. Trivial aortic insufficiency. Mean gradient elevated at 47 mmHg suggesting severe stenosis. Dimensionless index 0.25, AVA 0.87 cm^2. The aortic valve has been repaired/replaced. Aortic valve regurgitation is trivial. Aortic valve mean gradient measures 47.0 mmHg. Aortic valve peak gradient measures 71.6 mmHg. Aortic valve area, by VTI measures 0.88 cm. Pulmonic Valve: The pulmonic valve was normal in structure. Pulmonic valve regurgitation is trivial. Aorta: Aortic dilatation noted. There is mild dilatation of the aortic root, measuring 39 mm. Venous: The inferior vena cava is normal in size with greater than 50% respiratory variability, suggesting right atrial pressure of 3 mmHg. IAS/Shunts: No atrial level shunt detected by color flow Doppler.  LEFT VENTRICLE PLAX 2D LVIDd:         5.20 cm   Diastology LVIDs:         3.80 cm   LV e' medial:    5.00 cm/s LV PW:         1.10 cm   LV E/e' medial:  13.2 LV IVS:        1.10 cm   LV e' lateral:   9.79 cm/s LVOT diam:     2.10 cm   LV E/e' lateral: 6.7 LV SV:         86 LV SV Index:   44 LVOT Area:     3.46 cm  RIGHT VENTRICLE RV S prime:     11.60 cm/s TAPSE (M-mode): 1.5 cm LEFT ATRIUM              Index        RIGHT ATRIUM           Index LA diam:        3.80 cm  1.95 cm/m   RA Area:     18.10 cm LA Vol (A2C):   101.0 ml 51.85  ml/m  RA Volume:   46.50 ml  23.87 ml/m LA Vol (A4C):   101.0 ml 51.85 ml/m LA Biplane Vol: 103.0 ml 52.88 ml/m  AORTIC VALVE AV Area (Vmax):    0.86 cm AV  Area (Vmean):   0.85 cm AV Area (VTI):     0.88 cm AV Vmax:           423.00 cm/s AV Vmean:          328.000 cm/s AV VTI:            0.978 m AV Peak Grad:      71.6 mmHg AV Mean Grad:      47.0 mmHg LVOT Vmax:         105.00 cm/s LVOT Vmean:        80.750 cm/s LVOT VTI:          0.250 m LVOT/AV VTI ratio: 0.26  AORTA Ao Root diam: 3.90 cm Ao Asc diam:  3.60 cm MITRAL VALVE               TRICUSPID VALVE MV Area (PHT): 2.87 cm    TR Peak grad:   29.4 mmHg MV Decel Time: 264 msec    TR Vmax:        271.00 cm/s MV E velocity: 66.00 cm/s MV A velocity: 41.10 cm/s  SHUNTS MV E/A ratio:  1.61        Systemic VTI:  0.25 m                            Systemic Diam: 2.10 cm Dalton McleanMD Electronically signed by Franki Monte Signature Date/Time: 06/14/2022/3:32:16 PM    Final    CT BONE MARROW BIOPSY & ASPIRATION  Result Date: 06/14/2022 INDICATION: 76 year old with history of CLL.  Leukopenia and thrombocytopenia. EXAM: CT GUIDED BONE MARROW ASPIRATES AND BIOPSY Physician: Stephan Minister. Anselm Pancoast, MD MEDICATIONS: 1% lidocaine: Local anesthetic ANESTHESIA/SEDATION: None COMPLICATIONS: None immediate. PROCEDURE: The procedure was explained to the patient. The risks and benefits of the procedure were discussed and the patient's questions were addressed. Informed consent was obtained from the patient. The patient was placed prone on CT table. Images of the pelvis were obtained. The right side of back was prepped and draped in sterile fashion. The skin and right posterior ilium were anesthetized with 1% lidocaine. 11 gauge bone needle was directed into the right ilium with CT guidance. Two aspirates and one core biopsy were obtained. Bandage placed over the puncture site. RADIATION DOSE REDUCTION: This exam was performed according to the departmental dose-optimization program which includes automated exposure control, adjustment of the mA and/or kV according to patient size and/or use of iterative reconstruction technique. FINDINGS:  Biopsy needle was directed into the posterior right ilium. IMPRESSION: CT guided bone marrow aspiration and core biopsy. Electronically Signed   By: Markus Daft M.D.   On: 06/14/2022 13:35   US Abdomen Complete  Result Date: 06/14/2022 CLINICAL DATA:  History of chronic lymphocytic leukemia. EXAM: ABDOMEN ULTRASOUND COMPLETE COMPARISON:  CT scan of February 09, 2022. FINDINGS: Gallbladder: No gallstones or wall thickening visualized. No sonographic Murphy sign noted by sonographer. Common bile duct: Diameter: 4 mm which is within normal limits. Liver: 8 mm rounded hypoechoic abnormality is noted in right hepatic lobe which is not diagnostic for a cyst based on ultrasound criteria. Within normal limits in parenchymal echogenicity. Portal vein is patent on color Doppler imaging with normal direction  of blood flow towards the liver. IVC: No abnormality visualized. Pancreas: Visualized portion unremarkable. Spleen: Measures 14.9 x 12.2 x 6.8 cm with calculated volume of 644 cc, consistent with mild splenomegaly. No focal abnormality is noted. Right Kidney: Length: 12.8 cm. Two cysts are noted, the largest measuring 2.9 cm. Echogenicity within normal limits. No mass or hydronephrosis visualized. Left Kidney: Length: 12.1 cm. 2.3 cm simple cyst is seen in lower pole. Echogenicity within normal limits. No mass or hydronephrosis visualized. Abdominal aorta: No aneurysm visualized. Other findings: None. IMPRESSION: 8 mm rounded hypoechoic abnormality noted in right hepatic lobe which is not diagnostic for cyst based on ultrasound criteria. Neoplasm or metastatic disease cannot be excluded. When the patient is clinically stable and able to follow directions and hold their breath (preferably as an outpatient) further evaluation with dedicated abdominal MRI should be considered. Mild splenomegaly is noted. Electronically Signed   By: Marijo Conception M.D.   On: 06/14/2022 08:43   DG Chest 2 View  Result Date:  06/13/2022 CLINICAL DATA:  Altered mental status and weakness for several days, initial encounter EXAM: CHEST - 2 VIEW COMPARISON:  05/31/2022 FINDINGS: Cardiac shadow is within limits. Postsurgical changes are again seen. Aortic calcifications are noted. Lungs are clear. No bony abnormality is noted. IMPRESSION: No active cardiopulmonary disease. Electronically Signed   By: Inez Catalina M.D.   On: 06/13/2022 20:48   CT HEAD WO CONTRAST (5MM)  Result Date: 06/13/2022 CLINICAL DATA:  Dizziness EXAM: CT HEAD WITHOUT CONTRAST TECHNIQUE: Contiguous axial images were obtained from the base of the skull through the vertex without intravenous contrast. RADIATION DOSE REDUCTION: This exam was performed according to the departmental dose-optimization program which includes automated exposure control, adjustment of the mA and/or kV according to patient size and/or use of iterative reconstruction technique. COMPARISON:  11/07/2021 FINDINGS: Brain: No acute intracranial findings are seen. There are no signs of bleeding within the cranium. Cortical sulci are prominent. There is decreased density in periventricular and subcortical white matter. Calcifications are seen in basal ganglia on both sides. Vascular: Scattered arterial calcifications are seen. Skull: No fracture is seen. Sinuses/Orbits: There are no air-fluid levels in paranasal sinuses. There is partial opacification of left side of sphenoid sinus. There is minimal mucosal thickening in ethmoid and maxillary sinuses. Other: No significant interval changes are noted. IMPRESSION: No acute intracranial findings are seen in noncontrast CT brain. Atrophy. Small-vessel disease. Chronic sinusitis. Electronically Signed   By: Elmer Picker M.D.   On: 06/13/2022 20:13    Microbiology: No results found for this or any previous visit (from the past 240 hour(s)).  Time spent: 25 minutes  Signed: Murray Hodgkins, MD 07/09/22

## 2022-07-12 NOTE — Progress Notes (Signed)
  Daily Progress Note   Patient Name: [name] [facility]       Date: 07/20/22 DOB: 04/30/1946  Age: 76 y.o. MRN#: [id number] Attending Physician: Samuella Cota, MD Primary Care Physician: [name], MD Admit Date: 07/09/2022 Length of Stay: 7 days  Reason for Consultation/Follow-up: Terminal Care HPI/Patient Profile:  [name] is a 76 y.o. male with medical history significant of CLL,  bioprosthetic aortic valve replacement on Coumadin, CKD stage IIIb,recurrent PE on Coumadin, of paroxysmal atrial fibrillation, recent hospitalization from 06/14/2022 - 06/20/2022 for altered mental status, AKI and neutropenia.  He is being follow by [name] of Oncology.    Patient transitioned to comfort care on 8/19.  Subjective:   Subjective: Chart Reviewed. Updates received. Patient Assessed.  He is unresponsive, no family at bedside early this am at time of my visit.   Objective:   Vital Signs:  BP (!) 76/52 (BP Location: Left Leg)   Pulse 94   Temp 100 F (37.8 C) (Oral)   Resp 18   Ht '5\' 10"'$  (1.778 m)   Wt 74.5 kg   SpO2 (!) 83%   BMI 23.57 kg/m   Physical Exam:Palliative Assessment/Data: 10%   Assessment & Plan:   Impression: Present on Admission:  Acute metabolic encephalopathy  Recurrent pulmonary embolism (HCC)  Paroxysmal atrial fibrillation (HCC)  Pancytopenia (HCC)  Orthostatic hypotension  AKI (acute kidney injury) ([place])  Chronic diastolic CHF (congestive heart failure) (HCC)  Chronic lymphocytic leukemia (HCC)  HLD (hyperlipidemia)  History of pulmonary embolism  Aortic atherosclerosis ([place])  SUMMARY OF RECOMMENDATIONS   Seen early this AM, comfortable on current continuous infusion   Prognosis: Hospital death-hours likely  Thank you for allowing [name] to participate in the care of [name] PMT will continue to support holistically.  Time Total: 35 min  Visit consisted of counseling and education dealing with the complex and emotionally  intense issues of symptom management and palliative care in the setting of serious and potentially life-threatening illness. Greater than 50%  of this time was spent counseling and coordinating care related to the above assessment and plan.  Loistine Chance MD Palliative Medicine  Team Phone # [phone]

## 2022-07-12 NOTE — Progress Notes (Signed)
  Progress Note   Patient: [name] Murdoch KDT:[id number] DOB: [birth year] DOA: 06/12/2022     7 DOS: the patient was seen and examined on 07/16/2022   Brief hospital course: 76 y.o. male with medical history significant of CLL,  bioprosthetic aortic valve replacement on Coumadin, CKD stage IIIb,recurrent PE on Coumadin, of paroxysmal atrial fibrillation, recent hospitalization from 06/14/2022 - 06/20/2022 presented to oncology/Dr. Antonieta Pert office for follow-up.  He was found in the parking lot unresponsive at [facility] and subsequently taken to the ED. Admitted for acute encephalopathy.  Seen by neurology and oncology.  Etiology of encephalopathy unclear, toxic, metabolic considered.  Patient failed to improve developed fever and was started on antibiotics and treated empirically for possible meningeal encephalitis.  LP was desired but not obtainable secondary to coagulopathy and thrombocytopenia.  Eventually ended up transferring to ICU and critical care service.  Failed to improve, followed by neurology, infectious disease and critical care.  Ultimately plans were made for comfort care given failure to improve.  Continues on fentanyl infusion to manage pain.  Inpatient death anticipated.  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 Brain MRI unremarkable.  LP avoided deferred due to thrombocytopenia and coagulopathy.  Repeat EEG unremarkable. Urine drug screen negative.  -- Failed to improve despite broad-spectrum antibiotics. Subsequently made comfort care. --continue comfort care   Atrial fibrillation with rapid ventricular response treated with amiodarone infusion.   AKI baseline creatinine 1.05 -Worsening on last check.  Continue comfort care.   Pancytopenia, severe normocytic anemia -- Etiology unclear.   Right axilla hematoma Severe aortic stenosis  status post bioprosthetic AVR CLL, pancytopenia PMH pulmonary embolism, PAF on warfarin Acute urinary retention Chronic diastolic CHF ITP treated with IVIG and steroids. Aortic atherosclerosis   Continue comfort care       Subjective:  Appears to be resting comfortably on fentanyl infusion.  No new issues per RN.  Physical Exam: Vitals:   06/29/22 1911 06/30/22 0519 06/30/22 0523 07/01/22 0445  BP: (!) 182/171 (!) 84/53  (!) 76/52  Pulse: 89 68 84 94  Resp:  18  18  Temp: 98.9 F (37.2 C) 98.6 F (37 C)  100 F (37.8 C)  TempSrc: Oral Oral  Oral  SpO2: (!) 89%  (!) 88% (!) 83%  Weight:      Height:       Physical Exam Vitals reviewed.  Constitutional:      General: He is not in acute distress.    Appearance: He is not toxic-appearing.  Cardiovascular:     Rate and Rhythm: Normal rate and regular rhythm.     Heart sounds: No murmur heard. Pulmonary:     Effort: Pulmonary effort is normal. No respiratory distress.  Neurological:     Mental Status: He is alert.     Data Reviewed:  There are no new results to review at this time.  Family Communication: none  Disposition: Status is: Inpatient Remains inpatient appropriate because: comfort care, inpatient death expected  Planned Discharge Destination:  inpatient death expected    Time spent: 15 minutes  Author: Murray Hodgkins, MD 07-16-2022 9:57 AM  For on call review [email or web address].

## 2022-07-12 NOTE — Progress Notes (Signed)
Mr. Fessel is still hanging in.  He is not responsive.  His breathing is not as rapid.  His breathing is nice and peaceful.  He does have some slight pauses between breaths.  It is hard to say how much longer we are looking at.  Again he is comfortable.  He is on the fentanyl infusion.  I know that the staff upon [address] are doing a wonderful job with him.  I do appreciate their compassion.  Lattie Haw, MD  Darlyn Chamber 17:7

## 2022-07-12 NOTE — Progress Notes (Signed)
Wasted 50 mL of fentanyl with Autumn Barnabas Lister as witness.

## 2022-07-12 DEATH — deceased

## 2022-08-08 ENCOUNTER — Ambulatory Visit: Payer: Medicare Other | Admitting: Psychology

## 2022-08-19 ENCOUNTER — Other Ambulatory Visit: Payer: Self-pay | Admitting: Cardiovascular Disease

## 2022-08-20 IMAGING — CT CT ABD-PELV W/ CM
2 of 5 series · 12 of 36 positions shown, 15 images · IV contrast (APPLIED)
Comparison: October 18, 2020.

CLINICAL DATA: Fever of unknown origin. History of chronic
lymphocytic leukemia.

EXAM:
CT CHEST, ABDOMEN, AND PELVIS WITH CONTRAST
TECHNIQUE: Multidetector CT imaging of the chest, abdomen and pelvis was
performed following the standard protocol during bolus
administration of intravenous contrast.
CONTRAST:  80mL OMNIPAQUE IOHEXOL 350 MG/ML SOLN

[Series 504: cap with · axial · 0.86mm/px · z∈[-649,-89]mm · 9 of 140 slices shown, 12 images]
[im 14/140  mediastinal]
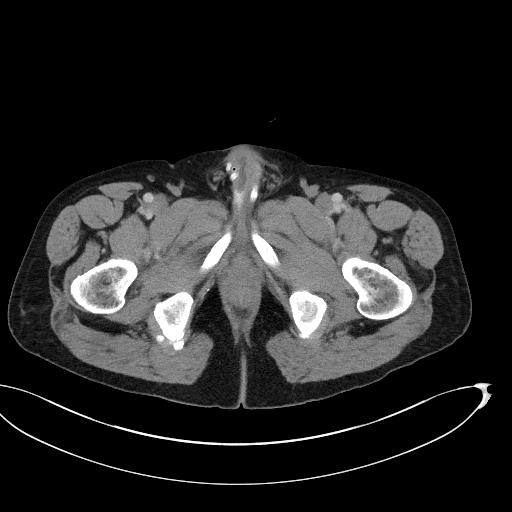
[im 14/140  lung]
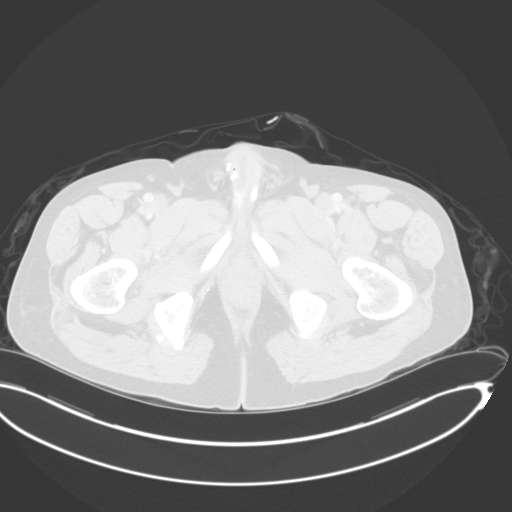
[im 28/140  lung]
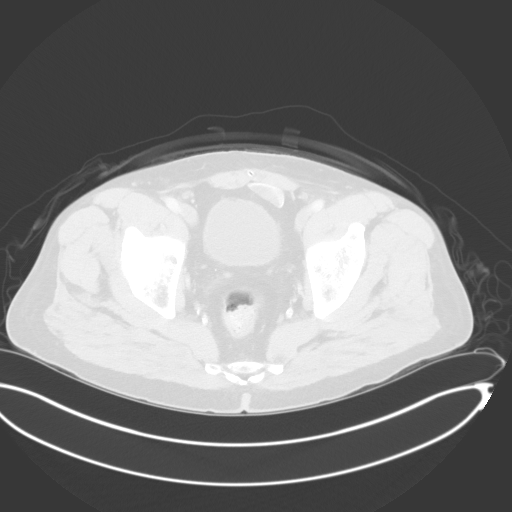
[im 42/140  lung]
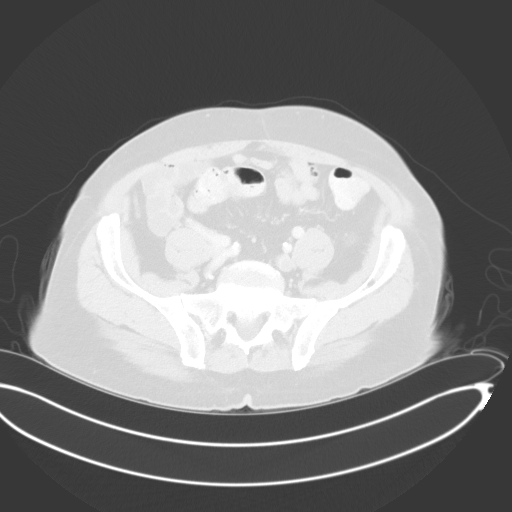
[im 56/140  lung]
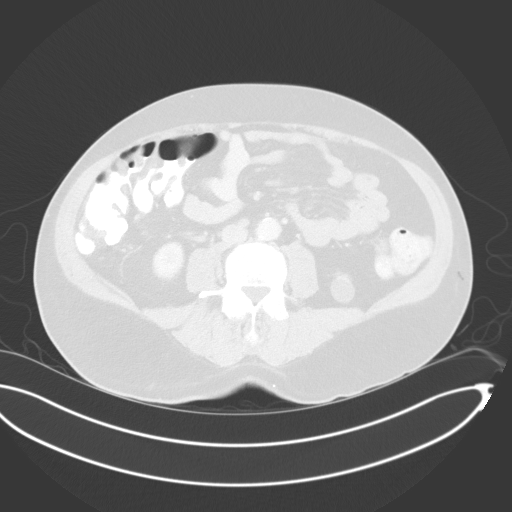
[im 70/140  mediastinal]
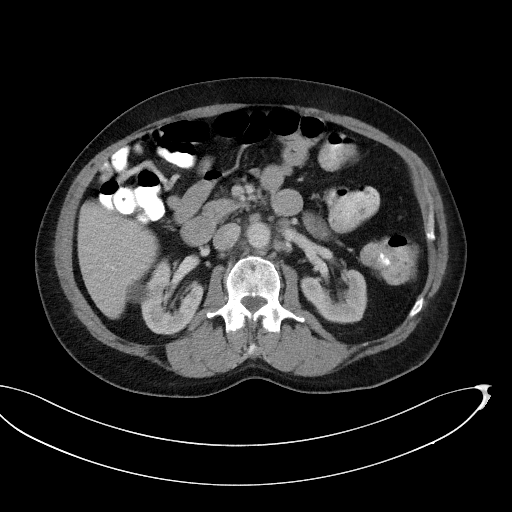
[im 70/140  lung]
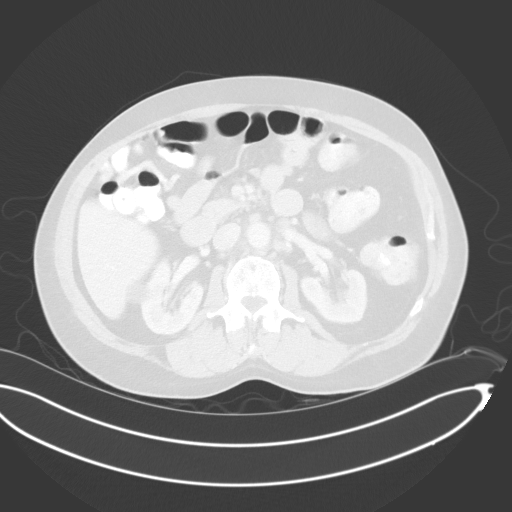
[im 84/140  lung]
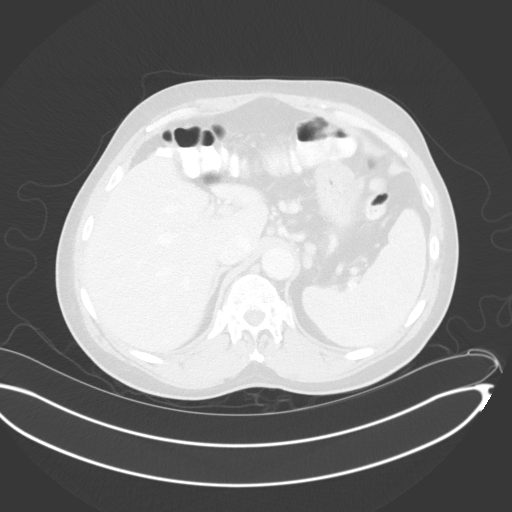
[im 98/140  lung]
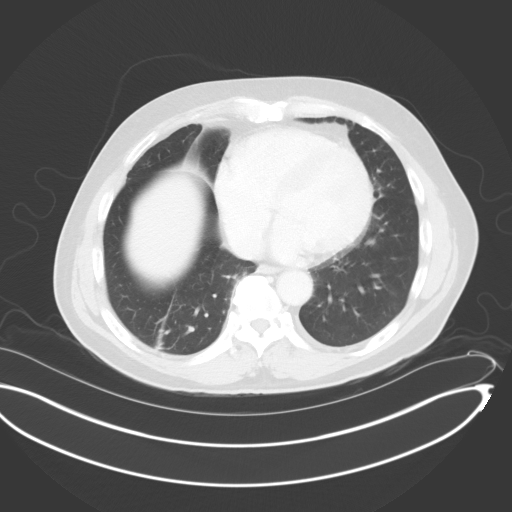
[im 112/140  lung]
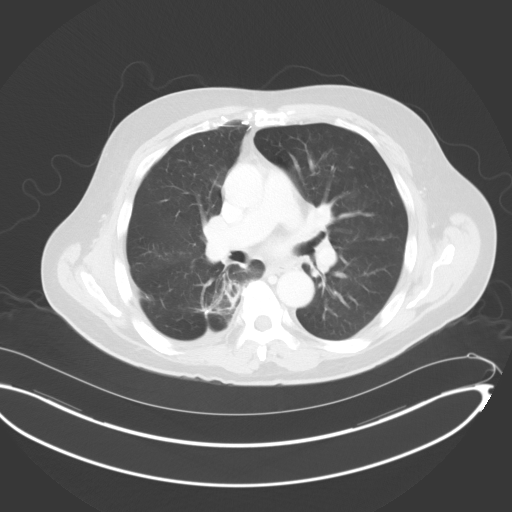
[im 126/140  mediastinal]
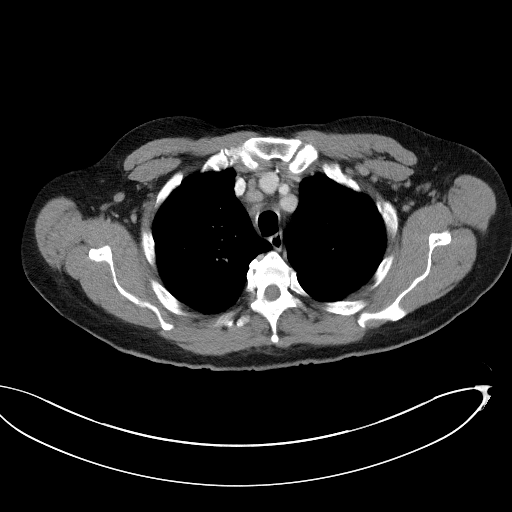
[im 126/140  lung]
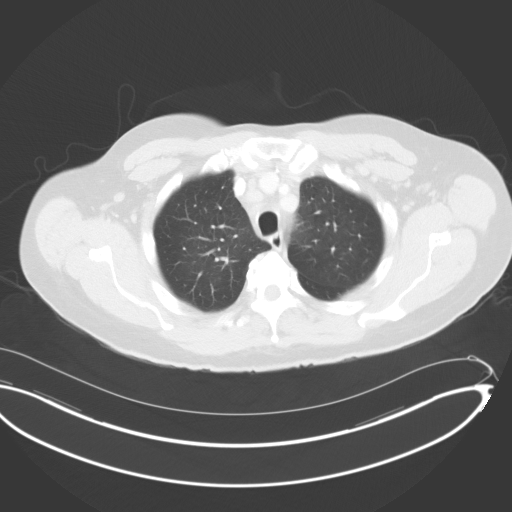

[Series 505: coronals · coronal · 0.89mm/px · 3 of 133 slices shown]
[im 27/133  lung]
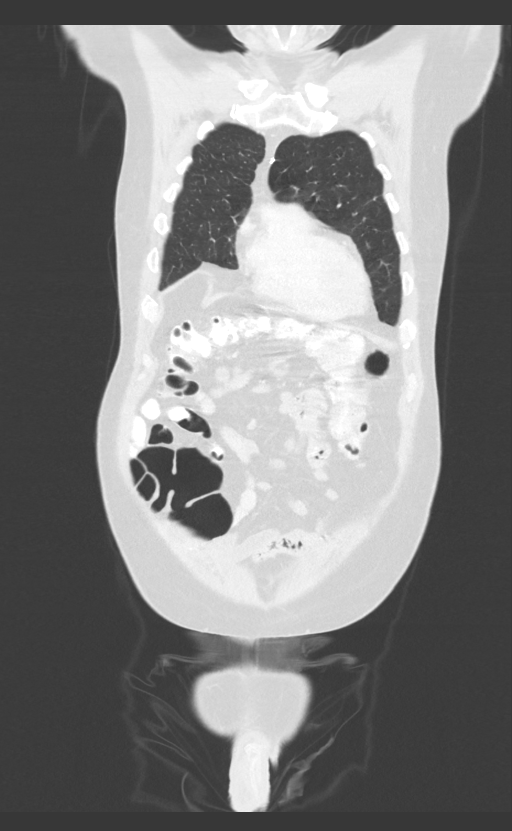
[im 53/133  lung]
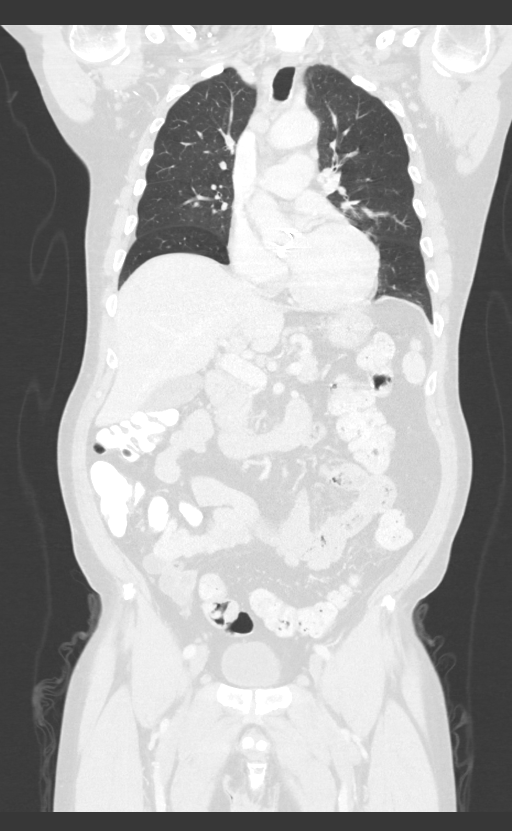
[im 80/133  lung]
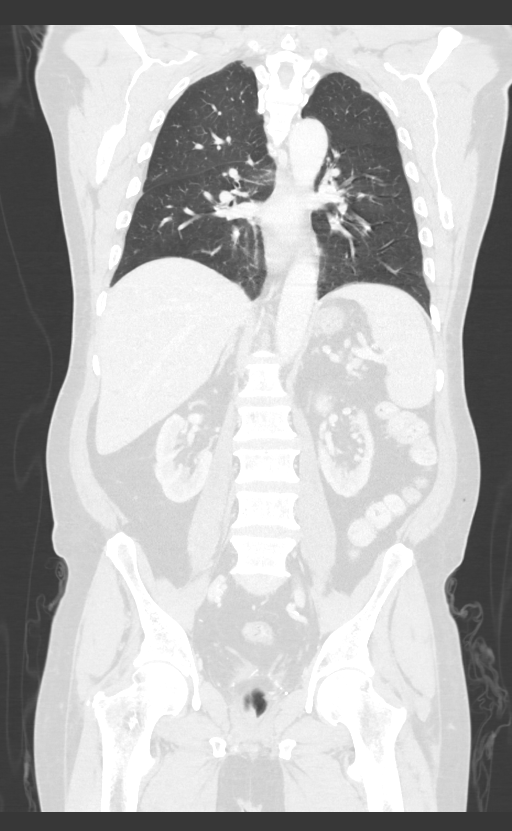

[12 of 36 positions shown; findings below may reference images not displayed]

FINDINGS: CT CHEST FINDINGS

Cardiovascular: Status post aortic valve repair. Atherosclerosis of
thoracic aorta is noted without aneurysm or dissection. Normal
cardiac size. No pericardial effusion.

Mediastinum/Nodes: Thyroid gland is unremarkable. The esophagus is
unremarkable. 1.9 cm precarinal lymph node is noted which is
significantly enlarged compared to prior exam. 8 mm aortopulmonary
window node is noted which is enlarged compared to prior exam. 13 mm
right paratracheal lymph node is noted which is enlarged compared to
prior exam. 10 mm right supraclavicular lymph node is noted which is
enlarged compared to prior exam.

Lungs/Pleura: No pneumothorax or pleural effusion is noted. Stable
scarring is noted in right lower lobe. Increased peribronchial
opacity is noted in superior segment of right lower lobe concerning
for focal inflammation.

Musculoskeletal: No chest wall mass or suspicious bone lesions
identified.

CT ABDOMEN PELVIS FINDINGS

Hepatobiliary: No focal liver abnormality is seen. No gallstones,
gallbladder wall thickening, or biliary dilatation.

Pancreas: Unremarkable. No pancreatic ductal dilatation or
surrounding inflammatory changes.

Spleen: Normal in size without focal abnormality.

Adrenals/Urinary Tract: Adrenal glands appear normal. Bilateral
renal cysts are noted. No hydronephrosis or renal obstruction is
noted. Urinary bladder is unremarkable.

Stomach/Bowel: Stomach is within normal limits. Appendix appears
normal. No evidence of bowel wall thickening, distention, or
inflammatory changes.

Vascular/Lymphatic: Aortic atherosclerosis. 15 x 11 mm lymph node is
seen to the left of the superior mesenteric trunk which is slightly
smaller compared to prior exam. 7 mm lymph node is noted anterior to
the abdominal aorta which is unchanged compared to prior exam 9 mm
right periaortic lymph node is noted which is enlarged compared to
prior exam. 1 cm left periaortic lymph node is noted just inferior
to the splenic vein, which is enlarged compared to prior.

Reproductive: Status post prostatectomy.

Other: No abdominal wall hernia or abnormality. No abdominopelvic
ascites.

Musculoskeletal: No acute or significant osseous findings.
IMPRESSION: Increased opacity is seen involving the superior segment of right
lower lobe concerning for focal pneumonia or other inflammation.

Mildly enlarged mediastinal, supraclavicular and periaortic
adenopathy is noted concerning for malignancy given the patient's
history of chronic lymphocytic leukemia.

Aortic Atherosclerosis ([id number]).

## 2022-09-05 ENCOUNTER — Ambulatory Visit: Payer: Medicare Other | Admitting: Psychology

## 2022-10-28 ENCOUNTER — Ambulatory Visit: Payer: Medicare Other | Admitting: Cardiovascular Disease

## 2022-10-31 ENCOUNTER — Ambulatory Visit: Payer: Medicare Other | Admitting: Psychology
